# Patient Record
Sex: Female | Born: 1965
Health system: Southern US, Community
[De-identification: ages and names within clinical notes are randomized; demographics above are authoritative.]

## PROBLEM LIST (undated history)

## (undated) DIAGNOSIS — H269 Unspecified cataract: Secondary | ICD-10-CM

## (undated) DIAGNOSIS — L02416 Cutaneous abscess of left lower limb: Secondary | ICD-10-CM

## (undated) DIAGNOSIS — F329 Major depressive disorder, single episode, unspecified: Secondary | ICD-10-CM

## (undated) DIAGNOSIS — C539 Malignant neoplasm of cervix uteri, unspecified: Secondary | ICD-10-CM

## (undated) DIAGNOSIS — K219 Gastro-esophageal reflux disease without esophagitis: Secondary | ICD-10-CM

## (undated) DIAGNOSIS — J45909 Unspecified asthma, uncomplicated: Secondary | ICD-10-CM

## (undated) DIAGNOSIS — F32A Depression, unspecified: Secondary | ICD-10-CM

## (undated) DIAGNOSIS — I10 Essential (primary) hypertension: Secondary | ICD-10-CM

## (undated) DIAGNOSIS — I2510 Atherosclerotic heart disease of native coronary artery without angina pectoris: Secondary | ICD-10-CM

## (undated) DIAGNOSIS — E785 Hyperlipidemia, unspecified: Secondary | ICD-10-CM

## (undated) DIAGNOSIS — G500 Trigeminal neuralgia: Secondary | ICD-10-CM

## (undated) DIAGNOSIS — R109 Unspecified abdominal pain: Secondary | ICD-10-CM

## (undated) DIAGNOSIS — G8929 Other chronic pain: Secondary | ICD-10-CM

## (undated) DIAGNOSIS — R87619 Unspecified abnormal cytological findings in specimens from cervix uteri: Secondary | ICD-10-CM

## (undated) DIAGNOSIS — G629 Polyneuropathy, unspecified: Secondary | ICD-10-CM

## (undated) HISTORY — DX: Vomiting, unspecified: R10.9

## (undated) HISTORY — DX: Cutaneous abscess of left lower limb: L02.416

## (undated) HISTORY — DX: Unspecified cataract: H26.9

## (undated) HISTORY — DX: Unspecified asthma, uncomplicated: J45.909

## (undated) HISTORY — DX: Essential (primary) hypertension: I10

## (undated) HISTORY — PX: ABDOMINAL HYSTERECTOMY: SHX81

## (undated) HISTORY — DX: Hyperlipidemia, unspecified: E78.5

## (undated) HISTORY — DX: Unspecified abnormal cytological findings in specimens from cervix uteri: R87.619

## (undated) HISTORY — DX: Unspecified abdominal pain: R10.9

## (undated) HISTORY — PX: CHOLECYSTECTOMY: SHX55

---

## 1999-02-11 DIAGNOSIS — C539 Malignant neoplasm of cervix uteri, unspecified: Secondary | ICD-10-CM

## 1999-02-11 HISTORY — DX: Malignant neoplasm of cervix uteri, unspecified: C53.9

## 2009-07-17 ENCOUNTER — Ambulatory Visit: Payer: Self-pay | Admitting: Diagnostic Radiology

## 2009-07-17 ENCOUNTER — Encounter: Payer: Self-pay | Admitting: Cardiology

## 2009-07-17 ENCOUNTER — Encounter: Payer: Self-pay | Admitting: Emergency Medicine

## 2009-07-18 ENCOUNTER — Ambulatory Visit: Payer: Self-pay | Admitting: Internal Medicine

## 2009-07-18 ENCOUNTER — Inpatient Hospital Stay (HOSPITAL_COMMUNITY): Admission: EM | Admit: 2009-07-18 | Discharge: 2009-07-20 | Payer: Self-pay | Admitting: Internal Medicine

## 2009-07-19 ENCOUNTER — Encounter (INDEPENDENT_AMBULATORY_CARE_PROVIDER_SITE_OTHER): Payer: Self-pay | Admitting: *Deleted

## 2009-07-19 ENCOUNTER — Encounter: Payer: Self-pay | Admitting: Cardiology

## 2009-07-20 ENCOUNTER — Encounter (INDEPENDENT_AMBULATORY_CARE_PROVIDER_SITE_OTHER): Payer: Self-pay | Admitting: *Deleted

## 2009-07-24 ENCOUNTER — Encounter (INDEPENDENT_AMBULATORY_CARE_PROVIDER_SITE_OTHER): Payer: Self-pay | Admitting: Internal Medicine

## 2009-07-30 ENCOUNTER — Ambulatory Visit: Payer: Self-pay | Admitting: Internal Medicine

## 2009-07-30 ENCOUNTER — Encounter (INDEPENDENT_AMBULATORY_CARE_PROVIDER_SITE_OTHER): Payer: Self-pay | Admitting: Nurse Practitioner

## 2009-07-30 DIAGNOSIS — IMO0001 Reserved for inherently not codable concepts without codable children: Secondary | ICD-10-CM

## 2009-07-30 DIAGNOSIS — E785 Hyperlipidemia, unspecified: Secondary | ICD-10-CM | POA: Insufficient documentation

## 2009-07-30 DIAGNOSIS — E1165 Type 2 diabetes mellitus with hyperglycemia: Secondary | ICD-10-CM | POA: Insufficient documentation

## 2009-07-30 DIAGNOSIS — R0789 Other chest pain: Secondary | ICD-10-CM | POA: Insufficient documentation

## 2009-07-30 DIAGNOSIS — K3184 Gastroparesis: Secondary | ICD-10-CM

## 2009-07-30 DIAGNOSIS — R7611 Nonspecific reaction to tuberculin skin test without active tuberculosis: Secondary | ICD-10-CM | POA: Insufficient documentation

## 2009-07-30 DIAGNOSIS — E119 Type 2 diabetes mellitus without complications: Secondary | ICD-10-CM

## 2009-07-30 DIAGNOSIS — K219 Gastro-esophageal reflux disease without esophagitis: Secondary | ICD-10-CM | POA: Insufficient documentation

## 2009-07-30 DIAGNOSIS — Z794 Long term (current) use of insulin: Secondary | ICD-10-CM | POA: Insufficient documentation

## 2009-07-30 HISTORY — DX: Hyperlipidemia, unspecified: E78.5

## 2009-07-30 HISTORY — DX: Long term (current) use of insulin: Z79.4

## 2009-07-30 HISTORY — DX: Gastro-esophageal reflux disease without esophagitis: K21.9

## 2009-07-30 HISTORY — DX: Other chest pain: R07.89

## 2009-07-30 HISTORY — DX: Type 2 diabetes mellitus without complications: E11.9

## 2009-07-30 HISTORY — DX: Gastroparesis: K31.84

## 2009-07-30 HISTORY — DX: Reserved for inherently not codable concepts without codable children: IMO0001

## 2009-07-30 HISTORY — DX: Nonspecific reaction to tuberculin skin test without active tuberculosis: R76.11

## 2009-07-30 HISTORY — DX: Type 2 diabetes mellitus with hyperglycemia: E11.65

## 2009-08-01 ENCOUNTER — Telehealth (INDEPENDENT_AMBULATORY_CARE_PROVIDER_SITE_OTHER): Payer: Self-pay | Admitting: Internal Medicine

## 2009-08-14 ENCOUNTER — Telehealth (INDEPENDENT_AMBULATORY_CARE_PROVIDER_SITE_OTHER): Payer: Self-pay | Admitting: Internal Medicine

## 2009-08-30 ENCOUNTER — Telehealth (INDEPENDENT_AMBULATORY_CARE_PROVIDER_SITE_OTHER): Payer: Self-pay | Admitting: *Deleted

## 2009-08-30 DIAGNOSIS — I160 Hypertensive urgency: Secondary | ICD-10-CM

## 2009-08-30 DIAGNOSIS — I10 Essential (primary) hypertension: Secondary | ICD-10-CM

## 2009-08-30 HISTORY — DX: Hypertensive urgency: I16.0

## 2009-08-30 HISTORY — DX: Essential (primary) hypertension: I10

## 2009-08-31 ENCOUNTER — Encounter (INDEPENDENT_AMBULATORY_CARE_PROVIDER_SITE_OTHER): Payer: Self-pay | Admitting: Internal Medicine

## 2009-09-06 ENCOUNTER — Ambulatory Visit: Payer: Self-pay | Admitting: Internal Medicine

## 2009-09-06 DIAGNOSIS — I429 Cardiomyopathy, unspecified: Secondary | ICD-10-CM

## 2009-09-06 HISTORY — DX: Cardiomyopathy, unspecified: I42.9

## 2009-09-10 ENCOUNTER — Ambulatory Visit: Payer: Self-pay | Admitting: Diagnostic Radiology

## 2009-09-10 ENCOUNTER — Ambulatory Visit: Payer: Self-pay | Admitting: Cardiology

## 2009-09-10 ENCOUNTER — Ambulatory Visit: Payer: Self-pay | Admitting: Cardiovascular Disease

## 2009-09-10 ENCOUNTER — Ambulatory Visit: Payer: Self-pay

## 2009-09-10 ENCOUNTER — Inpatient Hospital Stay (HOSPITAL_COMMUNITY): Admission: EM | Admit: 2009-09-10 | Discharge: 2009-09-13 | Payer: Self-pay | Admitting: Cardiology

## 2009-09-10 ENCOUNTER — Encounter: Payer: Self-pay | Admitting: Internal Medicine

## 2009-09-10 ENCOUNTER — Encounter: Payer: Self-pay | Admitting: Emergency Medicine

## 2009-09-10 ENCOUNTER — Encounter (INDEPENDENT_AMBULATORY_CARE_PROVIDER_SITE_OTHER): Payer: Self-pay | Admitting: *Deleted

## 2009-09-11 ENCOUNTER — Encounter (INDEPENDENT_AMBULATORY_CARE_PROVIDER_SITE_OTHER): Payer: Self-pay | Admitting: *Deleted

## 2009-09-13 ENCOUNTER — Telehealth: Payer: Self-pay | Admitting: Gastroenterology

## 2009-09-14 ENCOUNTER — Inpatient Hospital Stay (HOSPITAL_COMMUNITY): Admission: AD | Admit: 2009-09-14 | Discharge: 2009-09-17 | Payer: Self-pay | Admitting: Internal Medicine

## 2009-09-14 ENCOUNTER — Ambulatory Visit: Payer: Self-pay | Admitting: Gastroenterology

## 2009-09-14 DIAGNOSIS — E1122 Type 2 diabetes mellitus with diabetic chronic kidney disease: Secondary | ICD-10-CM

## 2009-09-14 DIAGNOSIS — R1013 Epigastric pain: Secondary | ICD-10-CM

## 2009-09-14 DIAGNOSIS — R112 Nausea with vomiting, unspecified: Secondary | ICD-10-CM

## 2009-09-14 DIAGNOSIS — N183 Chronic kidney disease, stage 3 unspecified: Secondary | ICD-10-CM

## 2009-09-14 HISTORY — DX: Nausea with vomiting, unspecified: R11.2

## 2009-09-14 HISTORY — DX: Chronic kidney disease, stage 3 unspecified: N18.30

## 2009-09-14 HISTORY — DX: Chronic kidney disease, stage 3 unspecified: E11.22

## 2009-09-14 HISTORY — DX: Epigastric pain: R10.13

## 2009-09-15 ENCOUNTER — Ambulatory Visit: Payer: Self-pay | Admitting: Internal Medicine

## 2009-09-15 ENCOUNTER — Encounter: Payer: Self-pay | Admitting: Internal Medicine

## 2009-09-17 ENCOUNTER — Encounter (INDEPENDENT_AMBULATORY_CARE_PROVIDER_SITE_OTHER): Payer: Self-pay | Admitting: *Deleted

## 2009-09-20 ENCOUNTER — Encounter (INDEPENDENT_AMBULATORY_CARE_PROVIDER_SITE_OTHER): Payer: Self-pay | Admitting: Internal Medicine

## 2009-09-21 ENCOUNTER — Ambulatory Visit: Payer: Self-pay | Admitting: Internal Medicine

## 2009-09-21 DIAGNOSIS — N390 Urinary tract infection, site not specified: Secondary | ICD-10-CM | POA: Insufficient documentation

## 2009-09-21 DIAGNOSIS — IMO0002 Reserved for concepts with insufficient information to code with codable children: Secondary | ICD-10-CM

## 2009-09-21 DIAGNOSIS — R7401 Elevation of levels of liver transaminase levels: Secondary | ICD-10-CM | POA: Insufficient documentation

## 2009-09-21 DIAGNOSIS — R740 Nonspecific elevation of levels of transaminase and lactic acid dehydrogenase [LDH]: Secondary | ICD-10-CM

## 2009-09-21 DIAGNOSIS — R7402 Elevation of levels of lactic acid dehydrogenase (LDH): Secondary | ICD-10-CM

## 2009-09-21 DIAGNOSIS — E1149 Type 2 diabetes mellitus with other diabetic neurological complication: Secondary | ICD-10-CM

## 2009-09-21 DIAGNOSIS — G4700 Insomnia, unspecified: Secondary | ICD-10-CM

## 2009-09-21 DIAGNOSIS — F17200 Nicotine dependence, unspecified, uncomplicated: Secondary | ICD-10-CM

## 2009-09-21 HISTORY — DX: Urinary tract infection, site not specified: N39.0

## 2009-09-21 HISTORY — DX: Reserved for concepts with insufficient information to code with codable children: IMO0002

## 2009-09-21 HISTORY — DX: Elevation of levels of liver transaminase levels: R74.01

## 2009-09-21 HISTORY — DX: Elevation of levels of lactic acid dehydrogenase (LDH): R74.02

## 2009-09-21 HISTORY — DX: Insomnia, unspecified: G47.00

## 2009-09-21 HISTORY — DX: Nicotine dependence, unspecified, uncomplicated: F17.200

## 2009-09-21 HISTORY — DX: Type 2 diabetes mellitus with other diabetic neurological complication: E11.49

## 2009-09-21 LAB — CONVERTED CEMR LAB
Bilirubin Urine: NEGATIVE
Blood Glucose, Fingerstick: 148
Glucose, Urine, Semiquant: 100
Specific Gravity, Urine: 1.025
Urobilinogen, UA: 0.2

## 2009-09-25 ENCOUNTER — Telehealth (INDEPENDENT_AMBULATORY_CARE_PROVIDER_SITE_OTHER): Payer: Self-pay | Admitting: Internal Medicine

## 2009-09-25 ENCOUNTER — Encounter (INDEPENDENT_AMBULATORY_CARE_PROVIDER_SITE_OTHER): Payer: Self-pay | Admitting: Internal Medicine

## 2009-09-25 ENCOUNTER — Ambulatory Visit: Payer: Self-pay | Admitting: Family Medicine

## 2009-09-27 ENCOUNTER — Encounter (INDEPENDENT_AMBULATORY_CARE_PROVIDER_SITE_OTHER): Payer: Self-pay | Admitting: Internal Medicine

## 2009-09-28 ENCOUNTER — Ambulatory Visit: Payer: Self-pay | Admitting: Internal Medicine

## 2009-09-28 ENCOUNTER — Telehealth (INDEPENDENT_AMBULATORY_CARE_PROVIDER_SITE_OTHER): Payer: Self-pay | Admitting: Internal Medicine

## 2009-09-28 DIAGNOSIS — K259 Gastric ulcer, unspecified as acute or chronic, without hemorrhage or perforation: Secondary | ICD-10-CM

## 2009-09-28 DIAGNOSIS — K279 Peptic ulcer, site unspecified, unspecified as acute or chronic, without hemorrhage or perforation: Secondary | ICD-10-CM | POA: Insufficient documentation

## 2009-09-28 HISTORY — DX: Gastric ulcer, unspecified as acute or chronic, without hemorrhage or perforation: K25.9

## 2009-09-28 HISTORY — DX: Peptic ulcer, site unspecified, unspecified as acute or chronic, without hemorrhage or perforation: K27.9

## 2009-10-03 ENCOUNTER — Encounter (INDEPENDENT_AMBULATORY_CARE_PROVIDER_SITE_OTHER): Payer: Self-pay | Admitting: Internal Medicine

## 2009-10-03 ENCOUNTER — Telehealth (INDEPENDENT_AMBULATORY_CARE_PROVIDER_SITE_OTHER): Payer: Self-pay | Admitting: *Deleted

## 2009-10-08 ENCOUNTER — Telehealth (INDEPENDENT_AMBULATORY_CARE_PROVIDER_SITE_OTHER): Payer: Self-pay | Admitting: *Deleted

## 2009-10-16 ENCOUNTER — Encounter (INDEPENDENT_AMBULATORY_CARE_PROVIDER_SITE_OTHER): Payer: Self-pay | Admitting: Internal Medicine

## 2009-11-06 ENCOUNTER — Ambulatory Visit: Payer: Self-pay | Admitting: Cardiology

## 2009-11-06 ENCOUNTER — Encounter (INDEPENDENT_AMBULATORY_CARE_PROVIDER_SITE_OTHER): Payer: Self-pay | Admitting: Internal Medicine

## 2009-11-12 ENCOUNTER — Encounter
Admission: RE | Admit: 2009-11-12 | Discharge: 2009-11-12 | Payer: Self-pay | Source: Home / Self Care | Attending: Internal Medicine | Admitting: Internal Medicine

## 2009-12-04 ENCOUNTER — Encounter (INDEPENDENT_AMBULATORY_CARE_PROVIDER_SITE_OTHER): Payer: Self-pay | Admitting: Internal Medicine

## 2009-12-04 ENCOUNTER — Telehealth (INDEPENDENT_AMBULATORY_CARE_PROVIDER_SITE_OTHER): Payer: Self-pay | Admitting: Internal Medicine

## 2009-12-06 ENCOUNTER — Encounter (INDEPENDENT_AMBULATORY_CARE_PROVIDER_SITE_OTHER): Payer: Self-pay | Admitting: *Deleted

## 2009-12-12 ENCOUNTER — Telehealth (INDEPENDENT_AMBULATORY_CARE_PROVIDER_SITE_OTHER): Payer: Self-pay | Admitting: Internal Medicine

## 2009-12-13 ENCOUNTER — Telehealth (INDEPENDENT_AMBULATORY_CARE_PROVIDER_SITE_OTHER): Payer: Self-pay | Admitting: Internal Medicine

## 2009-12-17 ENCOUNTER — Encounter (INDEPENDENT_AMBULATORY_CARE_PROVIDER_SITE_OTHER): Payer: Self-pay | Admitting: Internal Medicine

## 2009-12-21 ENCOUNTER — Telehealth (INDEPENDENT_AMBULATORY_CARE_PROVIDER_SITE_OTHER): Payer: Self-pay | Admitting: Internal Medicine

## 2009-12-24 ENCOUNTER — Encounter
Admission: RE | Admit: 2009-12-24 | Discharge: 2010-01-29 | Payer: Self-pay | Source: Home / Self Care | Attending: Internal Medicine | Admitting: Internal Medicine

## 2009-12-25 ENCOUNTER — Ambulatory Visit: Payer: Self-pay | Admitting: Internal Medicine

## 2009-12-25 DIAGNOSIS — B3731 Acute candidiasis of vulva and vagina: Secondary | ICD-10-CM

## 2009-12-25 DIAGNOSIS — R300 Dysuria: Secondary | ICD-10-CM

## 2009-12-25 DIAGNOSIS — M25579 Pain in unspecified ankle and joints of unspecified foot: Secondary | ICD-10-CM

## 2009-12-25 DIAGNOSIS — B373 Candidiasis of vulva and vagina: Secondary | ICD-10-CM

## 2009-12-25 HISTORY — DX: Dysuria: R30.0

## 2009-12-25 HISTORY — DX: Pain in unspecified ankle and joints of unspecified foot: M25.579

## 2009-12-25 HISTORY — DX: Acute candidiasis of vulva and vagina: B37.31

## 2009-12-25 HISTORY — DX: Candidiasis of vulva and vagina: B37.3

## 2009-12-25 LAB — CONVERTED CEMR LAB
Blood Glucose, Fingerstick: 271
Hgb A1c MFr Bld: 8.5 %
Nitrite: NEGATIVE
Urobilinogen, UA: 0.2
pH: 7

## 2009-12-26 ENCOUNTER — Encounter (INDEPENDENT_AMBULATORY_CARE_PROVIDER_SITE_OTHER): Payer: Self-pay | Admitting: Internal Medicine

## 2009-12-27 ENCOUNTER — Encounter (INDEPENDENT_AMBULATORY_CARE_PROVIDER_SITE_OTHER): Payer: Self-pay | Admitting: Internal Medicine

## 2010-01-04 ENCOUNTER — Ambulatory Visit (HOSPITAL_COMMUNITY): Admission: RE | Admit: 2010-01-04 | Discharge: 2010-01-04 | Payer: Self-pay | Admitting: Internal Medicine

## 2010-01-08 ENCOUNTER — Ambulatory Visit: Payer: Self-pay | Admitting: Internal Medicine

## 2010-01-08 LAB — CONVERTED CEMR LAB
Blood in Urine, dipstick: NEGATIVE
Glucose, Urine, Semiquant: 100
Ketones, urine, test strip: NEGATIVE
Protein, U semiquant: NEGATIVE
WBC Urine, dipstick: NEGATIVE

## 2010-01-21 ENCOUNTER — Encounter (INDEPENDENT_AMBULATORY_CARE_PROVIDER_SITE_OTHER): Payer: Self-pay | Admitting: Internal Medicine

## 2010-01-30 ENCOUNTER — Encounter (INDEPENDENT_AMBULATORY_CARE_PROVIDER_SITE_OTHER): Payer: Self-pay | Admitting: Internal Medicine

## 2010-02-17 ENCOUNTER — Telehealth (INDEPENDENT_AMBULATORY_CARE_PROVIDER_SITE_OTHER): Payer: Self-pay | Admitting: Internal Medicine

## 2010-02-18 ENCOUNTER — Encounter (INDEPENDENT_AMBULATORY_CARE_PROVIDER_SITE_OTHER): Payer: Self-pay | Admitting: Internal Medicine

## 2010-03-12 NOTE — Progress Notes (Signed)
Summary: PT APPOINMENT  Phone Note Outgoing Call   Summary of Call: I CALL MS KIRLAND AND SHE WAS AWARE OF HER APPT YESTERDAY AND SHE HAVE ANOTHER ONE ON MONDAY 12-24-09 @ 2:15PM  AND THE PH# STILL THE SAME . Initial call taken by: Cheryll Dessert,  December 21, 2009 9:30 AM

## 2010-03-12 NOTE — Progress Notes (Signed)
  Phone Note Other Incoming   Request: Send information Summary of Call: Request for records received from [facility]. Request forwarded to [facility].     

## 2010-03-12 NOTE — Progress Notes (Signed)
Summary: FEELING DISCOMFORT IN CHEST  Phone Note Call from Patient Call back at [facility] Phone [phone]   Summary of Call: [name] PT. MS Fransisco Hertz SAYS THAT SHE IS FEELING SOME DISCOMFORT IN HER CHEST ON  THE LEFT SIDE AND HAVING TROUBLE SLEEPING ON THE LEFT SIDE, AND FEELING SOME PAIN. Initial call taken by: Leodis Rains,  September 25, 2009 8:52 AM  Follow-up for Phone Call        Taking medications but still experiencing some "discomfort" -- pain starts upper left chest, travels to lower lateral arm and around to the back.  Almost having difficulty getting to sleep because of discomfort, also waking up because of pain. Also having trouble lifting.  Rest and being still helps, unsure of what brings it on.  Will be doing normal activity and pain will come on.  Describes pain as shooting pain, comes and goes.   Denies any other symptoms except left forearm and index finger have "a little tingle".  Denies nausea, SOB. Follow-up by: Dutch Quint RN,  September 25, 2009 11:18 AM  Additional Follow-up for Phone Call Additional follow up Details #1::        Suspect this is her cervical radiculopathy--we just started her on the Neurontin (gabapentin)   and will have to wait and see how it works for her--will take some time. Also, will be sending to PT for this Please also, make copies of admitting labs--the ones with SGOT and SGPT in 350 range each as well as discharge summary attention to Estill Bamberg at [facility] Also let her know I have written the note she requested. Additional Follow-up by: Julieanne Manson MD,  September 26, 2009 9:29 PM    Additional Follow-up for Phone Call Additional follow up Details #2::    Records faxed to [phone]................ Vesta Mixer CMA  September 27, 2009 12:37 PM   Additional Follow-up for Phone Call Additional follow up Details #3:: Details for Additional Follow-up Action Taken: Pt also aware of note and will fax to [facility] at  [phone]. Additional Follow-up by: Vesta Mixer CMA,  September 28, 2009 12:43 PM

## 2010-03-12 NOTE — Letter (Signed)
Summary: PT  INFORMATION SHEET  PT  INFORMATION SHEET   Imported By: Arta Bruce 08/21/2009 14:47:21  _____________________________________________________________________  External Attachment:    Type:   Image     Comment:   External Document

## 2010-03-12 NOTE — Progress Notes (Signed)
Summary: medication issues  Phone Note Call from Patient   Summary of Call: The pt doesn't know which medication is causing her issues (making her swelling and gaining wt).  Pt prefer the provider call her back because she doesn't know which medication is causing her problems because she takes everything at the same time.  ([employer] [facility] [place])  Delrae Alfred Md Initial call taken by: Manon Hilding,  August 14, 2009 4:00 PM  Follow-up for Phone Call        Left message on answering machine for pt to return call   Pt still needs if someone can call her back because there is a medication that is causing her stomach pain.Manon Hilding  August 20, 2009 10:25 AM Follow-up by: Vesta Mixer CMA,  August 16, 2009 3:51 PM  Additional Follow-up for Phone Call Additional follow up Details #1::        Spoke with pt and she says she gets a sharp pain in her stomach about 15 minutes after taking her pills, she also gets nauseated, but this is better if she does not eat as much.  She has changed the time of day she takes them and still gets the same effect.  She has tried taking before and after eating, but still gets the same effect.  She is not sure which pill is causing the issue as she takes them all together. Additional Follow-up by: Vesta Mixer CMA,  August 29, 2009 10:38 AM    Additional Follow-up for Phone Call Additional follow up Details #2::    The Metoclopramide should always be taken before meals--30 minutes before and the last dose at bedtime Have her take the Pravastatin with her evening meal The Carvedilol should be taken with  or just after her morning and evening meal. The insulin should always be given 15 minutes before morning and evening meal See if taking that way helps. Call if no improvement. Follow-up by: Julieanne Manson MD,  August 31, 2009 8:17 AM  Additional Follow-up for Phone Call Additional follow up Details #3:: Details for Additional Follow-up Action Taken: Left  message on answer machine for pt. to return call. Gaylyn Cheers RN  September 04, 2009 11:24 AM  Unable to leave messages on phone -- "call back later."  Dutch Quint RN  September 10, 2009 12:11 PM  Have not been able to reach pt will mail letter to have her call us........  Elmarie Shiley McCoy CMA  September 17, 2009 3:20 PM

## 2010-03-12 NOTE — Letter (Signed)
Summary: Generic Letter  [facility]  [address] [place], [place] [address]   Phone: [phone]  Fax: [phone]    09/27/2009  Scheryl Darter Director of [facility]   Re:  [name]      [address]      [place], [place]  [address]  Dear [name]:  [name] has recently established care with me at our clinic.  The Job Description outline I was given does not really discuss signficant physical patient care.  Much of the description sounds like observation and paperwork.  I do not see any difficulties for [name] with the duties as listed.  She does currently have a left cervical radiculopathy and that may affect her ability to lift/help move a patient with that arm or hand.  If I am not recognizing what her duties are acurately, please let me know what specific physical functions she must be able to do.            Sincerely,   Julieanne Manson MD

## 2010-03-12 NOTE — Assessment & Plan Note (Signed)
Summary: ROV   Visit Type:  Follow-up Referring Provider:  Shawnie Pons, MD Primary Provider:  Julieanne Manson, MD  CC:  chest pain.  History of Present Illness: [name] is in for followup.  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  She continues to have some chest pain,  and it is mostly left parasternal, with some radiation to the left arm.  She gave up smoking, and is no longer doing this.  She and I reveiwed her coronary angios in the office today and I explained to her the results of Dr. Earl Gala findings.  Since I last saw her  (Dr. Johney Frame saw her initially, and she was seen back in followup by Wynell Balloon), she has continued to have some discomfort from time to time.  She does not break out in a sweat.  Gi studies were done, and these are as noted in the record.  In additon, her LFTs were abnormal ,and she had [facility], where LFTs were reevaluated.  SHe did have a CT scan earlier in the summer as well.    Current Medications (verified): 1)  Promethazine Hcl 12.5 Mg Tabs (Promethazine Hcl) .... 1/2 By Mouth Q 6 Hours 2)  Lisinopril 5 Mg Tabs (Lisinopril) .Marland Kitchen.. 1 By Mouth Once Daily 3)  Oxycodone-Acetaminophen 5-325 Mg Tabs (Oxycodone-Acetaminophen) .Marland Kitchen.. 1 By Mouth Q 6 Hours 4)  Carvedilol 3.125 Mg Tabs (Carvedilol) .Marland Kitchen.. 1 Tab By Mouth Two Times A Day 5)  Metoclopramide Hcl 10 Mg Tabs (Metoclopramide Hcl) .Marland Kitchen.. 1 Tab Before A Meal Three Times A Day and 1/2 Tab At Bedtime 6)  Aspirin 81 Mg Tbec (Aspirin) (On Hold) .Marland Kitchen.. 1 Tab By Mouth Daily 7)  Novolog Mix 70/30 Flexpen 70-30 % Susp (Insulin Aspart Prot & Aspart) .... Subcutaneously Inject 12 Units Two Times A Day 15 Minutes Before Meal. 8)  Novotwist Needles .... Two Times A Day Injections 9)  Omeprazole 40 Mg Cpdr (Omeprazole) .... Take 1 Tablet By Mouth Once A Day 10)  Pravastatin Sodium 40 Mg Tabs (Pravastatin Sodium) .Marland Kitchen.. 1 Tab By Mouth  Daily  Allergies: 1)  ! Morphine 2)  ! * Latex  Vital Signs:  Patient profile:   45 year old female Height:      71 inches Weight:      200 pounds BMI:     28.00 Pulse rate:   75 / minute Pulse rhythm:   regular BP sitting:   170 / 106  (left arm) Cuff size:   large  Vitals Entered By: Laurance Flatten CMA (November 06, 2009 12:23 PM)  Physical Exam  General:  Well developed, well nourished, in no acute distress. Head:  normocephalic and atraumatic Eyes:  PERRLA/EOM intact; conjunctiva and lids normal. Lungs:  Clear bilaterally to auscultation and percussion. Heart:  Normal S1 and S2.  No definite murmur.  Msk:  No definite tenderness in the left chest.   Extremities:  No clubbing or cyanosis. Neurologic:  Alert and oriented x 3.   EKG  Procedure date:  11/06/2009  Findings:      NSR.  WNL.    Cardiac Cath  Procedure date:  09/13/2009  Findings:      HEMODYNAMIC FINDINGS:  Central aortic pressure 112/72.  Left ventricular pressure 108/2.  Left ventricular end-diastolic pressure 3.   ANGIOGRAPHIC FINDINGS: 1. The left main coronary artery bifurcated into the circumflex, a     ramus intermediate branch  and the left anterior descending artery.     The left main artery had no disease. 2. The left anterior descending is a large vessel that courses to the     apex and wraps around the apex.  There appears to be mild 20%     plaque in the proximal and mid left anterior descending artery.     There are two small caliber diagonal branches that are free of any     significant disease. 3. The circumflex artery gives off a very small-caliber first obtuse     marginal branch and a moderate-sized second obtuse marginal branch.     The AV groove circumflex is moderate size.  There is no disease in     this system. 4. The ramus intermediate is a moderate-sized vessel that has a 30%     plaque in the midportion.5. The right coronary artery is a moderate-sized dominant vessel  with     mild 20% plaque in the midportion of the vessel. 6. Left ventricular angiogram was performed in the RAO projection,     which showed normal left ventricular systolic function with     ejection fraction of 65%.   IMPRESSION: 1. Mild nonobstructive coronary artery disease. 2. Normal left ventricular systolic function.   RECOMMENDATIONS:  I recommend continued medical management at this time with a statin, beta-blocker, and an aspirin.         Verne Carrow, MD      Echocardiogram  Procedure date:  09/10/2009  Findings:      Study Conclusions            - Left ventricle: The cavity size was normal. Wall thickness was       normal. Systolic function was normal. The estimated ejection       fraction was in the range of 50% to 55%. Wall motion was normal;       there were no regional wall motion abnormalities. Doppler       parameters are consistent with abnormal left ventricular       relaxation (grade 1 diastolic dysfunction).     - Mitral valve: Mild regurgitation.     Impressions:            - Possible density in IVC; suggest CT to further evaluate.  CT Scan  Procedure date:  09/11/2009  Findings:      Findings:  There is no evidence for hepatic vein or inferior vena   cava thrombosis/clot.  The portal veins are patent as well.  There   are no focal hepatic abnormalities.  The liver, spleen, pancreas,   kidneys, and adrenal glands have a normal appearance.  There is no   adenopathy.  The gallbladder has been previously removed.  The   abdominal aorta is normal in caliber.  The bowel, as visualized,   has a normal appearance.     Review of the MIP images confirms the above findings.    IMPRESSION:   No findings to suggest thrombus within the inferior vena cava or   hepatic venous thrombosis. Negative study.    Read By:  Stoney Bang,  M.D.  CT Scan  Procedure date:  07/19/2009  Findings:       CT CHEST WITH CONTRAST    Technique:   Multidetector CT imaging of the chest was performed   following the standard protocol during bolus administration of   intravenous contrast.    Contrast: 100  ml Omnipaque-300  Comparison: Thoracic spine MR of earlier today.  Plain film chest   of 07/17/2009.    Findings: Lung windows demonstrate minimal bibasilar atelectasis.   No nodules or airspace opacities.    Soft tissue windows demonstrate normal heart size.  Trace bilateral   pleural effusions.  No pericardial effusion.    Normal appearance of the aorta.  No evidence of dissection or   aortic aneurysm.    No mediastinal or hilar adenopathy. Minimal residual thymic tissue   in the anterior mediastinum.    Limited abdominal imaging demonstrates No acute osseous   abnormality.    IMPRESSION:    1.  No evidence of thoracic aortic aneurysm.   2.  Trace bilateral pleural effusions.    Read By:  Consuello Bossier,  M.D.  EGD  Procedure date:  09/15/2009  Findings:      Multiple ulcers were found in the cardia. several shallow circular     ulcers at the cardial consistent with vomiting, not bleeding, bile     reflux With standard forceps, a biopsy was obtained and sent to     pathology (see image002, image003, image006, image007, image008,     and image009).  Otherwise the examination was normal (see     image005, image004, and image001). bile reflux, no fluid     retention, gastric outlet is normal    Retroflexed views revealed     no abnormalities.    The scope was then withdrawn from the patient     and the procedure completed.           COMPLICATIONS:  None           ENDOSCOPIC IMPRESSION:     1) Ulcers, multiple in the cardia     2) Otherwise normal examination     s/p biopsies, suspect vomiting induced ulcerations     RECOMMENDATIONS:     control vomiting with Zofran     add Carafate     GEScan     bowl rest,     add psychotropic medications for anxiety           REPEAT EXAM:  In 0 year(s) for.            ______________________________     Hedwig Morton. Juanda Chance, MD  Impression & Recommendations:  Problem # 1:  CHEST PAIN, ATYPICAL (ICD-786.59) Continues to have some chest pain.  The etiology of this remains unclear.  She has had a pretty big workup, including a cardiac cath, upper endo, chest CT scan.  She also had an abdominal CT.  As long as she continues to smoke, she does remain at risk for ACS, and I tried to reinforce this to her.  She is awarre.  The endo findings were significant.  SHe has no had CT to exclude PE, but her Wells score is low, and d-dimer has been within the normal range on at least two occasions.  We will continue to monitor her, but lifestyle change is critical to acheiving a better outcome underscoring her risk factors.  I reviewed diabetic coronary outcomes from my recent review article as well given her diabetics.   Her updated medication list for this problem includes:    Lisinopril 5 Mg Tabs (Lisinopril) .Marland Kitchen... 1 by mouth once daily    Carvedilol 3.125 Mg Tabs (Carvedilol) .Marland Kitchen... 1 tab by mouth two times a day  Problem # 2:  ULCER-GASTRIC (ICD-531.9) Followed by Dr. Juanda Chance. The following medications were removed from  the medication list:    Carafate 1 Gm Tabs (Sucralfate) ..... Mix with 10cc water to make slurry and take by mouth before each meal and at bedtime for 2 weeks. Her updated medication list for this problem includes:    Omeprazole 40 Mg Cpdr (Omeprazole) .Marland Kitchen... Take 1 tablet by mouth once a day  Problem # 3:  DIABETES MELLITUS-TYPE II (ICD-250.00) Issues reviewed.  Her updated medication list for this problem includes:    Lisinopril 5 Mg Tabs (Lisinopril) .Marland Kitchen... 1 by mouth once daily    Novolog Mix 70/30 Flexpen 70-30 % Susp (Insulin aspart prot & aspart) ..... Subcutaneously inject 12 units two times a day 15 minutes before meal.  Other Orders: EKG w/ Interpretation (93000)  Patient Instructions: 1)  Your physician recommends that you continue on your  current medications as directed. Please refer to the Current Medication list given to you today. 2)  Your physician wants you to follow-up in: 6 MONTHS.  You will receive a reminder letter in the mail two months in advance. If you don't receive a letter, please call our office to schedule the follow-up appointment.  Appended Document: ROV Lauren  Can you ask this lady if she has had mammograms ever, and if not she probably should have to.    I spoke with the pt and she has not had any mammograms performed.  The pt is scheduled to see Dr Delrae Alfred in November and I instructed her to discuss this with Dr Delrae Alfred at upcoming appt.  Pt agreed with plan. The pt would also like a copy of OV note mailed to her home.

## 2010-03-12 NOTE — Assessment & Plan Note (Signed)
Summary: stomach and throat issues//gk   Vital Signs:  Patient profile:   45 year old female Weight:      190 pounds Temp:     97.6 degrees F Pulse rate:   82 / minute Pulse rhythm:   regular Resp:     18 per minute BP sitting:   142 / 90  (left arm) Cuff size:   regular  Vitals Entered By: Vesta Mixer CMA (September 21, 2009 11:59 AM) CC: recent hosp f/u Is Patient Diabetic? Yes CBG Result 148  Does patient need assistance? Ambulation Normal   Primary Care Provider:  Julieanne Manson, MD  CC:  recent hosp f/u.  History of Present Illness: 1.  IDDM:  Pt. hospitalized beginning of month.  Back in June, A1C was 9.6% and today at 7.5%.  Sugars in mornings running 140s to 160s.  Occasionally, checking sugars later in day.  Running in 160-70 range.   Never picked up paperwork for ICP fill of Novolog.  2.  Gastroparesis:  Pt. had confirming gastric emtying studies during last hospitalization.  Is taking 5 mg of Reglan 4 times daily.  Did also have multiple shallow ulcers with EGD--felt to be more related to recurrent vomiting --Dr. Juanda Chance.  Was started on Carafate, the other was Omeprazole, the latter which she was unable to afford.  Carafate not listed in her discharge papers and did not get as well.  H. pylori was negative.  Pt. states she had been taking the Reglan regularly before hospitalization.  We had a very difficult time reaching her since her visit in June  3.  Elevated Liver enzymes:  PHD in [place] was notified of discontinuing INH--was on 8th month and did fill since discharge--pt. states was not aware she needed to discontinue.  Had elevations of both transaminases into mid 300 range--ALT the higher number.  Pt. does admit to drinking alcohol occasionally, but not a lot.    4.  Tobacco Abuse:  stopped with recent hospitalization--discussed also how may relate to GI symptoms above--GERD, in particular and to continue to avoid MJ as well for both this and DM--as it leads  to the munchies.  5.  Chest pain:  Reason for admission:  underwent cardiac cath with findings of nonobstructive CAD.  Aspirin on hold for 2 weeks secondary to ulcer findings on EGD.  6.  Hypertension:  Taking all of meds--will run out of Lisinpril in a week.  7.  C spine stenosis:  pt. states also told she had some lumbasacral DDD as well with last hospitalization--though do not see currently looking at DC summary.  Has not had PT as was not confirmed for eligibility at last visit.  8.  Insomnia:  Having problem getting to sleep and maintaining sleep.    9.  Vision not good since last hospitalization      Allergies (verified): 1)  ! Morphine 2)  ! * Latex  Physical Exam  General:  NAD, happy Lungs:  Normal respiratory effort, chest expands symmetrically. Lungs are clear to auscultation, no crackles or wheezes. Heart:  Normal rate and regular rhythm. S1 and S2 normal without gallop, murmur, click, rub or other extra sounds.  Radial pulses normal and equal Abdomen:  Bowel sounds positive,abdomen soft and non-tender without masses, organomegaly or hernias noted.   Impression & Recommendations:  Problem # 1:  TRANSAMINASES, SERUM, ELEVATED (ICD-790.4) Also called into [place] PHD--left message that we are discontinuing INH until they evaluate.  Pt. on ?  month 7 or  8 of treatment. Received a call the following Monday, 09/24/09 from PHD, Estill Bamberg.  She asked that we fax records from hospital, including the liver enzymes.  Stated she had definitely completed 6 months of INH--not on Rifampin as in discharge summary--and may be that will have to accept that as adequate treatment.  Fax labs attention to her at [phone] Orders: T-Comprehensive Metabolic Panel [phone])  Problem # 2:  DIABETES MELLITUS-TYPE II (ICD-250.00) Much improved control With vision complaints--Retasure. Her updated medication list for this problem includes:    Lisinopril 5 Mg Tabs (Lisinopril) .Marland Kitchen...  1 by mouth once daily    Novolog Mix 70/30 Flexpen 70-30 % Susp (Insulin aspart prot & aspart) ..... Subcutaneously inject 12 units two times a day 15 minutes before meal.  Orders: Capillary Blood Glucose/CBG (82948) Hgb A1C (01749SW)  Problem # 3:  TOBACCO ABUSE (ICD-305.1) Hopefully, resolved long term.  Problem # 4:  INSOMNIA (ICD-780.52) Discussed use of Gabapentin at bedtime may help with this. Discussed good sleep hygiene  Problem # 5:  DIABETIC PERIPHERAL NEUROPATHY (ICD-250.60) Start Gabapentin and maintain better glucose control Her updated medication list for this problem includes:    Lisinopril 5 Mg Tabs (Lisinopril) .Marland Kitchen... 1 by mouth once daily    Novolog Mix 70/30 Flexpen 70-30 % Susp (Insulin aspart prot & aspart) ..... Subcutaneously inject 12 units two times a day 15 minutes before meal.  Problem # 6:  GASTROPARESIS (ICD-536.3) As a recent problem again--increase dose of Metoclopromide with meals only--maintain lower dose at bedtime  Problem # 7:  SPINAL STENOSIS, CERVICAL WITH LEFT RADICULOPATHY (ICD-723.0)  Orders: Physical Therapy Referral (PT) Physical Therapy Referral (PT)  Problem # 8:  CHEST PAIN, ATYPICAL (ICD-786.59) likely related to gastroparesis and vomiting. With ulcers, pt. to restart Carafate and take for 2 weeks. Also to start Omeprazole and take for at least 2 months  Complete Medication List: 1)  Promethazine Hcl 12.5 Mg Tabs (Promethazine hcl) .... 1/2 by mouth q 6 hours 2)  Vitamin B-6 25 Mg Tabs (Pyridoxine hcl) .Marland Kitchen.. 1 by mouth once daily 3)  Lisinopril 5 Mg Tabs (Lisinopril) .Marland Kitchen.. 1 by mouth once daily 4)  Isoniazid 300 Mg Tabs (Isoniazid) .Marland Kitchen.. 1 by mouth once daily 5)  Oxycodone-acetaminophen 5-325 Mg Tabs (Oxycodone-acetaminophen) .Marland Kitchen.. 1 by mouth q 6 hours 6)  Carvedilol 3.125 Mg Tabs (Carvedilol) .Marland Kitchen.. 1 tab by mouth two times a day 7)  Metoclopramide Hcl 10 Mg Tabs (Metoclopramide hcl) .Marland Kitchen.. 1 tab before a meal three times a day and 1/2  tab at bedtime 8)  Aspirin 81 Mg Tbec (aspirin) (on Hold)  .Marland Kitchen.. 1 tab by mouth daily 9)  Novolog Mix 70/30 Flexpen 70-30 % Susp (Insulin aspart prot & aspart) .... Subcutaneously inject 12 units two times a day 15 minutes before meal. 10)  Novotwist Needles  .... Two times a day injections 11)  Cipro 250 Mg Tabs (Ciprofloxacin hcl) .... Take 1 tablet by mouth two times a day 12)  Omeprazole 40 Mg Cpdr (Omeprazole) .... Take 1 tablet by mouth once a day 13)  Pravastatin Sodium 40 Mg Tabs (Pravastatin sodium) .Marland Kitchen.. 1 tab by mouth daily 14)  Carafate 1 Gm Tabs (Sucralfate) .... Mix with 10cc water to make slurry and take by mouth before each meal and at bedtime for 2 weeks. 15)  Gabapentin 300 Mg Caps (Gabapentin) .Marland Kitchen.. 1 cap by mouth at bedtime for 3 days, then increase to 2 caps at bedtime  Other Orders:  UA Dipstick w/o Micro (manual) ([id number])  Patient Instructions: 1)  Stop  Isoniazid until further notice--please call [place] PHD and let them know your liver enzymes were elevated 2)  Follow up with Dr. Delrae Alfred in 3 months --DM and gastroparesis, cervical radiculopathy 3)  Retasure--please schedule Prescriptions: GABAPENTIN 300 MG CAPS (GABAPENTIN) 1 cap by mouth at bedtime for 3 days, then increase to 2 caps at bedtime  #60 x 11   Entered and Authorized by:   Julieanne Manson MD   Signed by:   Julieanne Manson MD on 09/21/2009   Method used:   Faxed to ...       [facility] - Pharmac (retail)       [address] [place], [place]  [address]       Ph: [phone]       Fax: [phone]   RxID:   [id number] CARAFATE 1 GM TABS (SUCRALFATE) Mix with 10cc water to make slurry and take by mouth before each meal and at bedtime for 2 weeks.  #56 x 0   Entered and Authorized by:   Julieanne Manson MD   Signed by:   Julieanne Manson MD on 09/21/2009   Method used:   Faxed to ...       [facility] - Pharmac (retail)        [address] [place], [place]  [address]       Ph: [phone]       Fax: [phone]   RxID:   [id number] OMEPRAZOLE 40 MG CPDR (OMEPRAZOLE) Take 1 tablet by mouth once a day  #30 x 11   Entered and Authorized by:   Julieanne Manson MD   Signed by:   Julieanne Manson MD on 09/21/2009   Method used:   Faxed to ...       [facility] - Pharmac (retail)       [address] [place], [place]  [address]       Ph: [phone]       Fax: [phone]   RxID:   [id number] NOVOTWIST NEEDLES two times a day injections  #60 x 11   Entered and Authorized by:   Julieanne Manson MD   Signed by:   Julieanne Manson MD on 09/21/2009   Method used:   Faxed to ...       [facility] - Pharmac (retail)       [address] [place], [place]  [address]       Ph: [phone]       Fax: [phone]   RxID:   [id number] ASPIRIN 81 MG TBEC (ASPIRIN) (ON HOLD) 1 tab by mouth daily  #30 x 11   Entered and Authorized by:   Julieanne Manson MD   Signed by:   Julieanne Manson MD on 09/21/2009   Method used:   Faxed to ...       [facility] - Pharmac (retail)       [address] [place], [place]  [address]       Ph: [phone]       Fax: [phone]   RxID:   [id number] METOCLOPRAMIDE HCL 10 MG TABS (METOCLOPRAMIDE HCL) 1 tab before a meal three times a day and 1/2 tab at bedtime  #  120 x 11   Entered and Authorized by:   Julieanne Manson MD   Signed by:   Julieanne Manson MD on 09/21/2009   Method used:   Faxed to ...       [facility] - Pharmac (retail)       [address] [place], [place]  [address]       Ph: [phone]       Fax: [phone]   RxID:   [id number] PRAVASTATIN SODIUM 40 MG TABS (PRAVASTATIN SODIUM) 1 tab by mouth daily  #30 x 11   Entered and Authorized by:   Julieanne Manson  MD   Signed by:   Julieanne Manson MD on 09/21/2009   Method used:   Faxed to ...       [facility] - Pharmac (retail)       [address] [place], [place]  [address]       Ph: [phone]       Fax: [phone]   RxID:   [id number] CARVEDILOL 3.125 MG TABS (CARVEDILOL) 1 tab by mouth two times a day  #60 x 11   Entered and Authorized by:   Julieanne Manson MD   Signed by:   Julieanne Manson MD on 09/21/2009   Method used:   Faxed to ...       [facility] - Pharmac (retail)       [address] [place], [place]  [address]       Ph: [phone]       Fax: [phone]   RxID:   [id number] LISINOPRIL 5 MG TABS (LISINOPRIL) 1 by mouth once daily  #30 x 11   Entered and Authorized by:   Julieanne Manson MD   Signed by:   Julieanne Manson MD on 09/21/2009   Method used:   Faxed to ...       [facility] - Pharmac (retail)       [address] [place], [place]  [address]       Ph: [phone]       Fax: [phone]   RxID:   [id number] PROMETHAZINE HCL 12.5 MG TABS (PROMETHAZINE HCL) 1/2 by mouth q 6 hours  #30 x 0   Entered and Authorized by:   Julieanne Manson MD   Signed by:   Julieanne Manson MD on 09/21/2009   Method used:   Faxed to ...       [facility] - Pharmac (retail)       [address] [place], [place]  [address]       Ph: [phone]       Fax: [phone]   RxID:   [id number] NOVOLOG MIX 70/30 FLEXPEN 70-30 % SUSP (INSULIN ASPART PROT & ASPART) Subcutaneously inject 12 units two times a day 15 minutes before meal.  #1 month x 11   Entered and Authorized by:   Julieanne Manson MD   Signed by:   Julieanne Manson MD on 09/21/2009   Method used:   Faxed to ...       [facility] - Pharmac (retail)       [address] [place], [place]  [address]        Ph: [phone]  Fax: [phone]   RxID:   [id number]   Laboratory Results   Urine Tests  Date/Time Received: September 21, 2009 2:02 PM  Date/Time Reported: September 21, 2009 2:02 PM   Routine Urinalysis   Color: lt. yellow Glucose: 100   (Normal Range: Negative) Bilirubin: negative   (Normal Range: Negative) Ketone: negative   (Normal Range: Negative) Spec. Gravity: 1.025   (Normal Range: 1.003-1.035) Blood: negative   (Normal Range: Negative) pH: 6.0   (Normal Range: 5.0-8.0) Protein: negative   (Normal Range: Negative) Urobilinogen: 0.2   (Normal Range: 0-1) Nitrite: negative   (Normal Range: Negative) Leukocyte Esterace: negative   (Normal Range: Negative)     Blood Tests     HGBA1C: 7.5%   (Normal Range: Non-Diabetic - 3-6%   Control Diabetic - 6-8%) CBG Random:: 148mg /dL

## 2010-03-12 NOTE — Letter (Signed)
Summary: *HSN Results Follow up  [facility]  [address] [place], [place] [address]   Phone: [phone]  Fax: [phone]      12/06/2009   KIONDRA CAICEDO [facility] [address] [place], [place]  [address]   Dear  Ms. Laylamarie Seelye,                            ____S.Drinkard,FNP   ____D. Gore,FNP       ____B. McPherson,MD   ____V. Rankins,MD    ____E. Mulberry,MD    ____N. Daphine Deutscher, FNP  ____D. Reche Dixon, MD    ____K. Philipp Deputy, MD    ____Other     This letter is to inform you that your recent test(s):  _______Pap Smear    _______Lab Test     _______X-ray    _______ is within acceptable limits  _______ requires a medication change  _______ requires a follow-up lab visit  _______ requires a follow-up visit with your provider   Comments:  We have been trying to reach you.  Please give [place] a call at your earliest convenience.       _________________________________________________________ If you have any questions, please contact our office                     Sincerely,  [place] Shannon [facility]

## 2010-03-12 NOTE — Letter (Signed)
Summary: Generic Letter  [facility]  [address] [place], [place] [address]   Phone: [phone]  Fax: [phone]    10/16/2009  Scheryl Darter Director of [facility]  Re:  JONA ERKKILA      [address]      [place], [place]  [address]  Dear [name]:  Thank you for sending the more extensive list of Ms. Crutcher job responsibilities.  As per my previous letter, she may have some difficulties with her left arm/hand in that she does have a left c spine radiculopathy for which she should have started physical therapy.  As I have only seen her twice and her radiculopathy was one of many concerns, I am not certain as to how significantly this will affect her ability to do her job.  At this point, I believe she can perform most if not all of the listed duties and will need to adjust a bit for the difficulties with her left arm.  If Ms. Wallander or you find this is not the case, please let me know.       Sincerely,   Julieanne Manson MD

## 2010-03-12 NOTE — Assessment & Plan Note (Signed)
Summary: eph/chest pains   History of Present Illness: This is a [place] female patient who was admitted to the hospital with nausea and vomiting and atypical chest pain. She ruled out for an MI and underwent stress testing which showed low risk Myoview with no ischemia or infarct and apical hypokinesis ejection fraction 47%. Patient was to have a 2-D echo and followup with me. Unfortunately the 2-D echo is scheduled total next Monday.  The patient is doing well she denies chest pain palpitations dyspnea dyspnea on exertion dizziness or presyncope. She does have continuing left arm numbness and tingling but was found to have C-spine and thoracic spine issues on MRI. I asked her to followup with her primary care physician at health service for referral.  The patient continues to smoke cigarettes and I discussed the importance of cessation.  Current Medications (verified): 1)  Promethazine Hcl 12.5 Mg Tabs (Promethazine Hcl) .... 1/2 By Mouth Q 6 Hours 2)  Vitamin B-6 25 Mg Tabs (Pyridoxine Hcl) .Marland Kitchen.. 1 By Mouth Once Daily 3)  Lisinopril 5 Mg Tabs (Lisinopril) .Marland Kitchen.. 1 By Mouth Once Daily 4)  Isoniazid 300 Mg Tabs (Isoniazid) .Marland Kitchen.. 1 By Mouth Once Daily 5)  Oxycodone-Acetaminophen 5-325 Mg Tabs (Oxycodone-Acetaminophen) .Marland Kitchen.. 1 By Mouth Q 6 Hours 6)  Carvedilol 3.125 Mg Tabs (Carvedilol) .Marland Kitchen.. 1 Tab By Mouth Two Times A Day 7)  Pravastatin Sodium 40 Mg Tabs (Pravastatin Sodium) .Marland Kitchen.. 1 Tab By Mouth Daily With Meal 8)  Metoclopramide Hcl 10 Mg Tabs (Metoclopramide Hcl) .... 1/2 Tab By Mouth Before Every Meal and At Bedtime 9)  Aspirin 81 Mg Tbec (Aspirin) .Marland Kitchen.. 1 Tab By Mouth Daily 10)  Novolog Mix 70/30 Flexpen 70-30 % Susp (Insulin Aspart Prot & Aspart) .... Inject Dose Two Times A Day 15 Minutes Before Meals 11)  Novotwist Needles .... Two Times A Day Injections  Allergies (verified): 1)  ! Morphine 2)  ! * Latex  Past History:  Past Medical History: Last updated: 08/30/2009 1.  Hypertension.   2. Non-insulin dependent diabetes mellitus.   3. Positive PPD on isoniazid and rifampin per Health Department.      Social History: Reviewed history from 08/30/2009 and no changes required. The patient smokes one-pack per day times 2 years.  She   occasionally drinks.  She is married.  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      Review of Systems       see history of present illness  Vital Signs:  Patient profile:   45 year old female Height:      71 inches Weight:      197 pounds BMI:     27.58 Pulse rate:   68 / minute Resp:     18 per minute BP sitting:   142 / 90  (left arm)  Vitals Entered By: Marrion Coy, CNA (September 06, 2009 10:30 AM)  Physical Exam  General:   Well-nournished, in no acute distress. Neck: No JVD, HJR, Bruit, or thyroid enlargement Lungs: No tachypnea, clear without wheezing, rales, or rhonchi Cardiovascular: RRR, PMI not displaced, heart sounds normal, no murmurs, gallops, bruit, thrill, or heave. Abdomen: BS normal. Soft without organomegaly, masses, lesions or tenderness. Extremities: without cyanosis, clubbing or edema. Good distal pulses bilateral SKin: Warm, no lesions or rashes  Musculoskeletal: No deformities Neuro: no focal signs    Impression & Recommendations:  Problem # 1:  CARDIOMYOPATHY, SECONDARY (ICD-425.9) Patient had an ejection  fraction of 47% with apical hypokinesis on Myoview scan. She is scheduled for 2-D echo this Monday to get a better view of her LV function. She is asymptomatic. Her updated medication list for this problem includes:    Lisinopril 5 Mg Tabs (Lisinopril) .Marland Kitchen... 1 by mouth once daily    Carvedilol 3.125 Mg Tabs (Carvedilol) .Marland Kitchen... 1 tab by mouth two times a day    Aspirin 81 Mg Tbec (Aspirin) .Marland Kitchen... 1 tab by mouth daily  Problem # 2:  CHEST PAIN, ATYPICAL (ICD-786.59) Patient had atypical chest pain. Mild scan was negative for ischemia. Her updated  medication list for this problem includes:    Lisinopril 5 Mg Tabs (Lisinopril) .Marland Kitchen... 1 by mouth once daily    Carvedilol 3.125 Mg Tabs (Carvedilol) .Marland Kitchen... 1 tab by mouth two times a day    Aspirin 81 Mg Tbec (Aspirin) .Marland Kitchen... 1 tab by mouth daily  Problem # 3:  SPINAL STENOSIS, CERVICAL WITH LEFT RADICULOPATHY (ICD-723.0) Patient had MRI of her C-spine and thoracic spine and has degenerative disc disease causing multilevel stenosis. I asked that she should talk to her primary care physician for referral.  Patient Instructions: 1)  Your physician recommends that you schedule a follow-up appointment in: Dr. Riley Kill in 2 months 2)  Your physician recommends that you continue on your current medications as directed. Please refer to the Current Medication list given to you today. 3)  Your physician discussed the hazards of tobacco use.  Tobacco use cessation is recommended and techniques and options to help you quit were discussed. 4)  Your physician has requested that you have an echocardiogram.  Echocardiography is a painless test that uses sound waves to create images of your heart. It provides your doctor with information about the size and shape of your heart and how well your heart's chambers and valves are working.  This procedure takes approximately one hour. There are no restrictions for this procedure.  ALREADY SCHEDULED FOR September 10, 2009

## 2010-03-12 NOTE — Letter (Signed)
Summary: [facility]   Imported By: Arta Bruce 12/04/2009 12:19:39  _____________________________________________________________________  External Attachment:    Type:   Image     Comment:   External Document

## 2010-03-12 NOTE — Letter (Signed)
Summary: [facility]   Imported By: Arta Bruce 10/16/2009 11:50:45  _____________________________________________________________________  External Attachment:    Type:   Image     Comment:   External Document

## 2010-03-12 NOTE — Progress Notes (Signed)
  Request from DDS sent to [facility]  October 03, 2009 7:47 AM

## 2010-03-12 NOTE — Initial Assessments (Signed)
Summary: GERD & NON-CARDIAC CHEST PAIN    (NEW TO GI)   DEBORAH   History of Present Illness Visit Type: consult Primary GI MD: Melvia Heaps MD [facility] Primary Provider: Julieanne Manson, MD Requesting Provider: Shawnie Pons, MD Chief Complaint: chest pain non-cardiac, nausea/vomiting that is not helped by phenergan, abdominal pain History of Present Illness:   45 YO FEMALE NEW TO GI TODAY,REFERRED BY CARDIOLOGY. SHE HAS BEEN HOSPITALIZED TWICE THIS SUMMER C C/O CHEST PAIN. SHE HAS UNDERGONE EXTENSIVE WORKUP WHICH HAS BEEN NEGATIVE. THIS INCLUDES; CHEST CT,CT ANGIO OF ABDOMEN,ECHO WITH EF55%,STRESS MYOVIEW. SHE WAS READMITTED ON8/1/11 AND UNDERWENT CARDIAC CATH ON 8/2  WHICH SHOWED MILD NONOBSTRUCTIVE CORONARY DISEASE. SHE WAS D/C HOME YESTERDAY WITH APPT TO COME HERE TO CONSIDER EGD. PT COMES IN TODAY NOW C/O UPPER ABDOMINAL PAIN RADIATING IN TO HER BACK WHICH HAS GOTTEN PROGRESSIVELY WORSE OVER THE PAST 2 DAYS. SHE SAYS IT STARTED AFTER THE CATH. SHE IS HAVING NAUSEA,AND VOMITING,AND HAS NOT KEPT ANYTHING DOWN X 2 DAYS. NO FEVER. NO STOOLS IN 4-5 DAYS. SHE HAS BEN ON OMEPRAZOLE ONCE DAILY SINCE JUNE-NO CHANGE. SHE DOES HAVE HEARTBURN, NO DYSPHAGIA. SHE IS INSULIN DEPENDENT. HAS ALSO EMPIRICALLY BEEN ON REGLAN  4 X DAILY OVER THE PAST MONTH-NO CHANGE IN SXS. ONE BABY AS A DAILY-DENIES NSAIDS. SHE DENIES ANY DAILY ETOH USE.  SHE IS S/P GB2001, SAYS SHE HAD PANCREATITS IN 2005 ,UNCLEAR ETIOLOGY.   GI Review of Systems    Reports abdominal pain, acid reflux, bloating, chest pain, heartburn, loss of appetite, nausea, and  vomiting.     Location of  Abdominal pain: upper abdomen.    Denies belching, dysphagia with liquids, dysphagia with solids, vomiting blood, and  weight loss.      Reports change in bowel habits and  constipation.     Denies anal fissure, black tarry stools, diarrhea, diverticulosis, fecal incontinence, heme positive stool, hemorrhoids, irritable bowel syndrome, jaundice, light  color stool, liver problems, rectal bleeding, and  rectal pain. Preventive Screening-Counseling & Management  Alcohol-Tobacco     Smoking Status: quit      Drug Use:  no.      Current Medications (verified): 1)  Promethazine Hcl 12.5 Mg Tabs (Promethazine Hcl) .... 1/2 By Mouth Q 6 Hours 2)  Vitamin B-6 25 Mg Tabs (Pyridoxine Hcl) .Marland Kitchen.. 1 By Mouth Once Daily 3)  Lisinopril 5 Mg Tabs (Lisinopril) .Marland Kitchen.. 1 By Mouth Once Daily 4)  Isoniazid 300 Mg Tabs (Isoniazid) .Marland Kitchen.. 1 By Mouth Once Daily 5)  Oxycodone-Acetaminophen 5-325 Mg Tabs (Oxycodone-Acetaminophen) .Marland Kitchen.. 1 By Mouth Q 6 Hours 6)  Carvedilol 3.125 Mg Tabs (Carvedilol) .Marland Kitchen.. 1 Tab By Mouth Two Times A Day 7)  Crestor 40 Mg Tabs (Rosuvastatin Calcium) .... Take 1 Tablet By Mouth Once A Day 8)  Metoclopramide Hcl 5 Mg Tabs (Metoclopramide Hcl) .... Take 1 Tablet By Mouth Two Times A Day Before Meals 9)  Aspirin 81 Mg Tbec (Aspirin) (On Hold) .Marland Kitchen.. 1 Tab By Mouth Daily 10)  Novolog Mix 70/30 Flexpen 70-30 % Susp (Insulin Aspart Prot & Aspart) .... Inject Dose Two Times A Day 15 Minutes Before Meals 11)  Novotwist Needles .... Two Times A Day Injections 12)  Cipro 250 Mg Tabs (Ciprofloxacin Hcl) .... Take 1 Tablet By Mouth Two Times A Day 13)  Omeprazole 40 Mg Cpdr (Omeprazole) .... Take 1 Tablet By Mouth Once A Day  Allergies (verified): 1)  ! Morphine 2)  ! * Latex  Past History:  Past Medical  History: 1. Hypertension.  2.INSULIN-dependent diabetes mellitus.   3. Positive PPD on isoniazid and rifampin per Health Department.  Hyperlipidemia Cervical Cancer Pancreatitis 2005.  Past Surgical History: Cholecystectomy 2001 Hysterectomy CARDIAC CATH 8/11;NEGATIVE  Family History: Family History of Heart Disease: Father, MI age 64 deceased Family History of Diabetes: Mother Family History of Breast Cancer: PGGM  Social History: Single, 3 boys, 1 girl Part-Time-[employer]   Patient is a former smoker. Quit  09/10/2009 Alcohol Use - yes Daily Caffeine Use Sweet Tea Illicit Drug Use - no Smoking Status:  quit Drug Use:  no  Review of Systems       The patient complains of back pain, headaches-new, night sweats, sore throat, and voice change.  The patient denies allergy/sinus, anemia, anxiety-new, arthritis/joint pain, blood in urine, breast changes/lumps, confusion, cough, coughing up blood, depression-new, fainting, fatigue, fever, hearing problems, heart murmur, heart rhythm changes, itching, menstrual pain, muscle pains/cramps, nosebleeds, pregnancy symptoms, shortness of breath, skin rash, sleeping problems, swelling of feet/legs, swollen lymph glands, thirst - excessive, urination - excessive, urination changes/pain, urine leakage, and vision changes.         SEE HPI  Vital Signs:  Patient profile:   45 year old female Height:      71 inches Weight:      197 pounds BMI:     27.58 Pulse rate:   80 / minute Pulse rhythm:   regular BP supine:   140 / 86  (left arm) Cuff size:   regular  Vitals Entered By: Francee Piccolo CMA Duncan Dull) (September 14, 2009 2:43 PM)  Physical Exam  General:  Well developed, well nourished, no acute distress. Head:  Normocephalic and atraumatic. Eyes:  PERRLA, no icterus. Lungs:  Clear throughout to auscultation. Heart:  Regular rate and rhythm; no murmurs, rubs,  or bruits. Abdomen:  SOFT, TENDER ACROSS UPPER ABDOMEN,NO PALP MASS OR HSM,NO REBOUND,BS+ Rectal:  NOT DONE Neurologic:  Alert and  oriented x4;  grossly normal neurologically. Psych:  Alert and cooperative. Normal mood and affect.   Impression & Recommendations:  Problem # 1:  ABDOMINAL PAIN-EPIGASTRIC (ICD-789.06) Assessment New 45 YO FEMALE DIABETIC WITH NONCARDIAC CHEST PAIN WITH RECENT EXTENSIVE WORKUP UNREVEALING. ETIOLOGY UNCLEAR -R/O GERD,ESOPHAGEAL SPASM.  NOW WITH ACUTE EPIGASTRIC PAIN RADIATING TO HER BACK WITH INTRACTABLE NAUSEA AND VOMITING,INABILITY TO KEEP DOWN by mouth'S  X 2  DAYS. R/O PUD,R/O PANCREATITIS. R/O POST CATH  COMPLICATION THOUGH LESS LIKELY.  [facility] TO [facility]. WILL NEDD HYDRATION,LABS,PAIN CONTROL,ANTIEMETICS. PLAIN ABDOMINAL FILMS SHE HAD A CT ANGIO OF ABDOMEN ON 8/2 WHICH WAS NEGATIVE- IF HGB HAS DROPPED MAY NEED CT 9POST CARDIAC CATH) PROBABLE EGD . SEE ORDERS.  Problem # 2:  DIABETES MELLITUS-TYPE II (ICD-250.00) Assessment: Comment Only INSULIN DEPENDENT  Problem # 3:  HYPERTENSION (ICD-401.9) Assessment: Comment Only  Problem # 4:  POSITIVE PPD (ICD-795.5) Assessment: Comment Only ON TREATMENT X 7 MONTHS WITH ISONIAZID  Problem # 5:  SPINAL STENOSIS, CERVICAL WITH LEFT RADICULOPATHY (ICD-723.0) Assessment: Comment Only

## 2010-03-12 NOTE — Progress Notes (Signed)
Summary: Form to filled out  Phone Note Call from Patient Call back at [phone]   Summary of Call: Pt dropped off an Sport and exercise psychologist for the provider to filled out and pt would like to know if someone can call her back when is ready [name]  Initial call taken by: Manon Hilding,  August 30, 2009 8:22 AM  Follow-up for Phone Call        PUT IN YOUR REFILL SLOT Follow-up by: Arta Bruce,  August 30, 2009 9:16 AM  Additional Follow-up for Phone Call Additional follow up Details #1::        Form filled out--when pt. picks up--let her know that I have listed her to take 10 units two times a day of Novolog mix, but that I wrote to go up to max of 60 units daily total as suspect we will need to titrate.  Need to know if she is still using only 10 units twice daily or if she is even taking currently Also, need her to drop off sugar results weekly--last sugars from end of June. Additional Follow-up by: Julieanne Manson MD,  August 31, 2009 8:24 AM    Additional Follow-up for Phone Call Additional follow up Details #2::    CALLED PT TO PICK UP FORM Follow-up by: Arta Bruce,  August 31, 2009 2:14 PM   Appended Document: Form to filled out Please make sure she gets the above info when she picks up form  Appended Document: Form; med instructions Home phone [phone] not accepting messages; work # [phone] female stated we would need to continue calling her home # unable to leave message for her. Gaylyn Cheers RN  September 11, 2009 9:46 AM     Clinical Lists Changes

## 2010-03-12 NOTE — Letter (Signed)
Summary: Generic Letter  [facility]  [address] [place], [place] [address]   Phone: [phone]  Fax: [phone]    12/04/2009  Ms. Dawn Management consultant of [employer] Fax:  [phone]  Re:  KRITI KATAYAMA      [address]      [place], [place]  [address]  Dear [name]:  Sorry for the delay in returning you letter.  At this time, Ms. Blattner should be receiving PT for her left cervical radiculopathy.  I believe I have a follow up with her in November and we will see where she is at then.   Until that time, she may need some help with lifting, turning a patient and anything that requires her to lift with both hands.  Her physical therapist would be a better source for information as to how to accomodate for her left arm/hand.  I have not contacted Ms. Mettler to see how PT is progressing, but she should be going through PT at [facility] here in [place].  I believe the PT phone number is [phone]. Her other health issues when last seen were improved and stable.       Sincerely,   Julieanne Manson MD

## 2010-03-12 NOTE — Discharge Summary (Signed)
NAMENOVA, SCHMUHL            ACCOUNT NO.:  [id number]      MEDICAL RECORD NO.:  [id number]          PATIENT TYPE:  INP      LOCATION:  2020                         FACILITY:  [facility]      PHYSICIAN:  Erick Blinks, MD     DATE OF BIRTH:  11/23/1965      DATE OF ADMISSION:  07/18/2009   DATE OF DISCHARGE:  07/20/2009                                  DISCHARGE SUMMARY      PRIMARY CARE PHYSICIAN:  Unassigned.      DISCHARGE DIAGNOSES:   1. Atypical chest pain.   2. Hypertension.   3. Uncontrolled type 2 diabetes.   4. Tobacco dependence.   5. Possible cardiomyopathy with ejection fraction of 47% on stress       test.  This needs to be confirmed by 2-D echocardiogram.      DISCHARGE MEDICATIONS:   1. Enteric-coated aspirin 81 mg p.o. daily.   2. Coreg 3.125 mg p.o. b.i.d.   3. Crestor 40 mg p.o. daily.   4. Reglan 5 mg p.o. b.i.d.   5. Paragraph 6.25 mg p.o. q.6 h. P.r.n.   6. Lisinopril 5 mg p.o. daily.   7. NovoLog 70/30 ten units subcu b.i.d.   8. Oxycodone 5/325 mg 1 tablet p.o. q.6 h. p.r.n.   9. Fistula 1 tablet p.o. daily.   10.Pyridoxine 20 mg p.o. daily.   11.Isoniazid 300 mg 1 tablet p.o. daily.      BRIEF ADMISSION HISTORY:  This is a 45 year old female with hypertension   and diabetes who presents to the ER with complaints of nausea and   vomiting.  The patient started experiencing atypical chest pain starting   at 4 p.m.  The patient was admitted for further evaluation.      HOSPITAL COURSE:   1. Atypical chest pain.  The patient was seen in consultation by       [facility].  She ruled out for acute coronary syndrome with       negative cardiac enzymes and no significant EKG changes.  Due to       her significant risk factors and family history, she underwent       stress test which showed low-risk Myoview with no ischemia or       infarct and apical hypokinesis with EF of 47%.  It was recommended       the patient have a follow-up 2-D  echocardiogram as an outpatient to       confirm her 2-D echo and further follow up with Cardiology for       management as felt appropriate.  She has been started on Coreg,       aspirin Crestor as well as lisinopril.   2. Type 2 diabetes.  The patient was not taking any medication for       diabetes at home.  She was found to have an A1c of 9.6.  She has       been started on NovoLog 70/30 insulin.  She had extensive diabetes  education and has been taught how to administer insulin.  She will       have outpatient diabetes education as well and will follow up with       her primary care physician for further management.   3. Hypertension.  The patient has been started on lisinopril which       will likely be helpful for her blood pressure as well as her       diabetes.  She is also currently on Coreg.   4. Right arm tingling.  The patient had an MRI of the C-spine as well       as T-spine done to rule out any spinal processes that may be       causing a left arm and tingling.  MRI showed some spinal canal       stenosis.  This was discussed with the patient and was recommended       to continue with physical therapy as well as pain management.  If       her symptoms continue to worsen or are significantly impairing,       then neurosurgical evaluation can be conducted as an outpatient.   5. Question of thoracic aortic aneurysm.  The patient had MRI of the C       and T-spine done which showed questionable thoracic aortic       aneurysm.  Subsequent CT chest was done, which ruled out any       aneurysm.      CONSULTS OBTAINED IN THE HOSPITAL:  [facility].      PROCEDURES DONE IN THE HOSPITAL:  None.      DIAGNOSTIC IMAGING:   1. Chest x-ray from July 17, 2009, shows no active cardiopulmonary       process.   2. Stress test from July 19, 2009, shows low-risk Myoview with no       ischemia or infarct, suggests echo or MRI correlation for EF.   3. MRI of the C-spine shows  congenitally short pedicles, facet and       uncinate sparring, and degenerative disk disease causing multilevel       central and foraminal stenosis.   4. MRI of the T-spine shows spondylosis and degenerative disk disease       causing some mild right acentric central stenosis at T6-T7 and T7-       T8.  There is suspicion for ascending thoracic aortic aneurysm,       although ascending thoracic aorta is obscured, this could be       further investigated with chest CT if clinically warranted.   5. CT chest with contrast on July 19, 2009, shows no evidence of       thoracic aortic aneurysm, trace bilateral pleural effusions.      DISCHARGE INSTRUCTIONS:  The patient should continue on a heart-healthy,   low-calorie diet.  She has been made an appointment at [facility] to   follow up with primary care physician on August 03, 2009, at 11 a.m.  She   will follow with Dr. Delrae Alfred.  She will also need to follow up with   [facility] and will also need a followup 2-D echocardiogram as   an outpatient.  This will be set up by [facility].  She should   conduct her activity as tolerated.  Plan was discussed with the patient,   she is in agreement.  She will also likely need outpatient  physical   therapy for spinal stenosis.               Erick Blinks, MD            JM/MEDQ  D:  07/20/2009  T:  07/20/2009  Job:  [id number]      Electronically Signed by Durward Mallard MEMON  on 07/29/2009 12:33:49 AM

## 2010-03-12 NOTE — Progress Notes (Signed)
Summary: TRIAGE  Phone Note From Other Clinic Call back at [phone]  (pager)   Caller:  Lenard Galloway PA/Dr.Stuckey Call For: Dr. Arlyce Dice Reason for Call: Schedule Patient Appt Summary of Call: hx or GERD... non cardiac chest pain... would like pt worked in within next couple of weeks. Initial call taken by: Vallarie Mare,  September 13, 2009 10:26 AM  Follow-up for Phone Call        Pt. will see Mike Gip [facility] on 09-14-09 at 2:30pm. Susy Frizzle will advise pt. of appt/med.list/co-pay.  Follow-up by: Laureen Ochs LPN,  September 13, 2009 10:49 AM

## 2010-03-12 NOTE — Procedures (Signed)
Summary: Upper Endoscopy  Patient: [name] Note: All result statuses are Final unless otherwise noted.  Tests: (1) Upper Endoscopy (EGD)   EGD Upper Endoscopy       DONE     [facility]     [address]     [place], [place]  [address]           ENDOSCOPY PROCEDURE REPORT           PATIENT:  [name], [name]  MR#:  [id number]     BIRTHDATE:  1965-03-23, 43 yrs. old  GENDER:  female           ENDOSCOPIST:  Hedwig Morton. Juanda Chance, MD     Referred by:  Barbette Hair Arlyce Dice, M.D.           PROCEDURE DATE:  09/15/2009     PROCEDURE:  EGD with biopsy     ASA CLASS:  Class II     INDICATIONS:  vomiting persistent vomiting, negative cardiology     work up, diabetic           MEDICATIONS:   Versed 7 mg, Fentanyl 75 mcg     TOPICAL ANESTHETIC:  Cetacaine Spray           DESCRIPTION OF PROCEDURE:   After the risks benefits and     alternatives of the procedure were thoroughly explained, informed     consent was obtained.  The EG-[id number] ([id number]) endoscope was     introduced through the mouth and advanced to the second portion of     the duodenum, without limitations.  The instrument was slowly     withdrawn as the mucosa was fully examined.     <<PROCEDUREIMAGES>>           Multiple ulcers were found in the cardia. several shallow circular     ulcers at the cardial consistent with vomiting, not bleeding, bile     reflux With standard forceps, a biopsy was obtained and sent to     pathology (see image002, image003, image006, image007, image008,     and image009).  Otherwise the examination was normal (see     image005, image004, and image001). bile reflux, no fluid     retention, gastric outlet is normal    Retroflexed views revealed     no abnormalities.    The scope was then withdrawn from the patient     and the procedure completed.           COMPLICATIONS:  None           ENDOSCOPIC IMPRESSION:     1) Ulcers, multiple in the cardia     2) Otherwise normal  examination     s/p biopsies, suspect vomiting induced ulcerations     RECOMMENDATIONS:     control vomiting with Zofran     add Carafate     GEScan     bowl rest,     add psychotropic medications for anxiety           REPEAT EXAM:  In 0 year(s) for.           ______________________________     Hedwig Morton. Juanda Chance, MD           CC:           n.     eSIGNED:   Hedwig Morton. Brodie at 09/15/2009 10:31 AM           Carney Living,  [id number]  Note: An exclamation mark (!) indicates a result that was not dispersed into the flowsheet. Document Creation Date: 09/15/2009 10:33 AM _______________________________________________________________________  (1) Order result status: Final Collection or observation date-time: 09/15/2009 10:17 Requested date-time:  Receipt date-time:  Reported date-time:  Referring Physician:   Ordering Physician: Lina Sar [phone]) Specimen Source:  Source: Launa Grill Order Number: [id number] Lab site:

## 2010-03-12 NOTE — Consult Note (Signed)
[name], [name] NO.:  [id number]      MEDICAL RECORD NO.:  [id number]          PATIENT TYPE:  INP      LOCATION:  2020                         FACILITY:  [facility]      PHYSICIAN:  Hillis Range, MD       DATE OF BIRTH:  10-Apr-1965      DATE OF CONSULTATION:  07/18/2009   DATE OF DISCHARGE:                                    CONSULTATION      CARDIOLOGIST:  Arturo Morton. Riley Kill, MD, [facility]      REASON FOR CONSULT:  Chest pain.      HISTORY OF PRESENT ILLNESS:  This is a 45 year old African American   female with no known coronary artery disease who presents to the   emergency department, complaining of chest pain as well as tingling in   her left arm.  The chest pain is a stabbing sensation that lasts several   seconds and in the sternal area.  This is followed by a retrosternal   pressure that lasted 1-2 minutes.  She states that this has been   occurring for the past 2 days, but has increased in frequency.  She says   that sometimes the pain will increase in intensity with deep   inspiration.  The pain can sometimes be relieved with repositioning.   The tingling sensation has been going on for approximately 2 weeks and   is mid axillary on her left side that radiates down to her fingers on   her left side.  This pain also waxes and wanes.  She has experienced   some nausea and vomiting as well as hot flashes over the past several   days.  She has not been diaphoretic or increased shortness of breath.      In the emergency room, the patient was given nitro paste.  She has been   without chest pain since admission, but still has a tingling/numbness   feeling in her left arm.  Cardiology was asked to evaluate in light of   the patient's increased risk factors for coronary artery disease.      PAST MEDICAL HISTORY:   1. Hypertension.   2. Non-insulin dependent diabetes mellitus.   3. Positive PPD on isoniazid and rifampin per [facility] Department.      SOCIAL  HISTORY:  The patient lives in [place] with her boyfriend.   She is a home health aide.  She states that she does use cigarettes   occasionally as well as 1-2 glasses of wine occasionally.  She denies   any illicit drug use.  She says she does try an exercise and eat   healthy.      FAMILY HISTORY:  Her mother is still alive with history of strokes and   no coronary artery disease.  Her father passed away suddenly at age of   63 from an MI per the patient.      ALLERGIES:   1. MORPHINE.   2. LATEX.      MEDICATIONS:   1. Aspirin 325 mg  daily.   2. Coreg 3.125 mg twice a day.   3. Sliding scale insulin.   4. Isoniazid 300 mg daily.   5. Reglan 5 mg twice a day.   6. Lovaza 1 g daily.   7. Zofran 4 mg p.o. q.4 hours.   8. Protonix 40 mg twice a day.   9. Vitamin B6 25 mg daily.   10.Crestor 40 mg daily.      REVIEW OF SYSTEMS:  Pertinent positives as stated in HPI.  All other   systems are reviewed and are negative.      PHYSICAL EXAMINATION:  VITAL SIGNS:  Temperature 98.7, pulse 66,   respirations 14, blood pressure 101/64, O2 saturation 97% on room air.   GENERAL:  This is a well-developed [place] American female who appears   her stated age.  She is in no apparent distress with normal affect.   HEENT:  Normal.   NECK:  Neck supple without bruit or JVD.   CARDIOVASCULAR:  Regular rate and rhythm with S1-2.  No murmurs or rubs.   LUNGS:  Clear to auscultation without wheezes or rales.   SKIN:  No rash, lesions.  There was no shingles rash.   ABDOMEN:  Soft, nontender, positive bowel sounds x4.   EXTREMITIES:  No cyanosis, clubbing, edema.   MUSCULOSKELETAL:  No joint deformities.   NEURO:  Alert and oriented x3.      LABS:  WBC 8.5, hemoglobin 13.1, hematocrit 39.7.  Sodium 144, potassium   3.9, chloride 106, bicarb 27, BUN 11, creatinine 0.9.  D-dimer is 0.37.   Point of care markers are negative x2.  Cardiac enzymes are negative x2.   Total cholesterol 193,  triglycerides 61, HDL 40, LDL 141.      EKG showing sinus rhythm with a rate of 60 beats per minute.  Normal   axis and no ST-T wave changes.  No Q-waves noted.      ASSESSMENT AND PLAN:  This is a 45 year old female with history of   diabetes mellitus, hypertension, hyperlipidemia, and family history of   possible early coronary artery disease, who presents with chest pain.   The patient's chest pain is mostly atypical by history.  Her EKG is   without any acute ST-T wave changes and her cardiac enzymes remained   negative.  With the patient's multiple risk factors for coronary artery   disease, it would be advised to continue with an inpatient Lexiscan   Myoview for rule out of an ischemic process.  If the patient is at low   risk, then no further workup will be needed.  Tobacco cessation has been   discussed and advised for the patient.  We also considering adding an   ACE inhibitor for the patient's hypertension in light of her diabetes   mellitus.      Thank you for this consult.  We will continue to follow along with you.               Leonette Monarch, PA-C         ______________________________   Hillis Range, MD         NB/MEDQ  D:  07/18/2009  T:  07/19/2009  Job:  [id number]      Electronically Signed by Alen Blew P.A. on 07/31/2009 03:34:53 PM   Electronically Signed by Hillis Range MD on 08/06/2009 09:21:06 AM

## 2010-03-12 NOTE — Letter (Signed)
Summary: [facility]   Imported By: Arta Bruce 09/27/2009 10:01:46  _____________________________________________________________________  External Attachment:    Type:   Image     Comment:   External Document

## 2010-03-12 NOTE — Progress Notes (Signed)
  Phone Note From Other Clinic   Summary of Call: Darl Pikes from tb clinic in [facility] called and they do advise her to stop her tb med since her lft's have been elevated and she has had enough of the med to be treated and they will continue to check her lft's every two weeks until normal again and they will let us know those results also.  Pt is aware of this also. Initial call taken by: Vesta Mixer CMA,  September 28, 2009 4:53 PM

## 2010-03-12 NOTE — Letter (Signed)
Summary: *HSN Results Follow up  [facility]  [address] [place], [place] [address]   Phone: [phone]  Fax: [phone]      09/17/2009   MELVINIA ASHBY [address] [place], [place]  [address]   Dear  Ms. Damya Striplin,                               ____S. Weaver PA-C    __X__E. Mulberry,MD    ____N. Daphine Deutscher, FNP  ____Other     This letter is to inform you that your recent test(s):  _______Pap Smear    _______Lab Test     _______X-ray    _______ is within acceptable limits  _______ requires a medication change  _______ requires a follow-up lab visit  _______ requires a follow-up visit with your provider   Comments:  Please give our office a call when you receive this letter.  Thank you.       _________________________________________________________ If you have any questions, please contact our office                     Sincerely,  Vesta Mixer CMA [facility]

## 2010-03-12 NOTE — Letter (Signed)
Summary: LOG BOOK FOR BLOOD SUGAR  LOG BOOK FOR BLOOD SUGAR   Imported By: Arta Bruce 08/31/2009 12:57:21  _____________________________________________________________________  External Attachment:    Type:   Image     Comment:   External Document

## 2010-03-12 NOTE — Progress Notes (Signed)
Summary: Meds eligibility  Phone Note Other Incoming Call back at [phone]   Caller: Margy Clarks Rep Summary of Call: Called regarding getting Ms. [name] mix until can get eligibility.   Suggested their ICP program--will bring paperwork and drop off in next couple of days. May not even need to fill out the paperwork--so may just need the prescription--I will leave an Rx and can give to [name] when she comes if no paperwork needed.  Tiffany--I called [name] and left a message to call and speak with you so she knows what's going on.  After send in paperwork/Rx should take about 10 days.  Vernona Rieger thought another drug rep, Homero Fellers, might have some samples to cover her until can get the ICP in.  She told me both pens and needles would be provided. Also, need to know if [name] works--did not get that info at visit.  Initial call taken by: Julieanne Manson MD,  August 01, 2009 5:31 PM  Follow-up for Phone Call        Spoke with [name] and advised of provider's response-verbalized understanding.  She states that she works, but only 5.5 hours per week. Dutch Quint RN  August 03, 2009 2:09 PM   Additional Follow-up for Phone Call Additional follow up Details #1::        Did the paperwork get dropped off?   Did pt. pick up? Has pt. returned paperwork? Additional Follow-up by: Julieanne Manson MD,  August 09, 2009 5:29 PM    Additional Follow-up for Phone Call Additional follow up Details #2::    Left message on answering machine for pt to return call ........ Vesta Mixer CMA  August 16, 2009 3:50 PM   Additional Follow-up for Phone Call Additional follow up Details #3:: Details for Additional Follow-up Action Taken: Pt came in and picked up paperwork yesterday, will fill out and bring back to [place]. Additional Follow-up by: Vesta Mixer CMA,  August 21, 2009 9:06 AM  New/Updated Medications: NOVOLOG MIX 70/30 FLEXPEN 70-30 % SUSP (INSULIN ASPART PROT &  ASPART) inject dose two times a day 15 minutes before meals * NOVOTWIST NEEDLES two times a day injections Prescriptions: NOVOTWIST NEEDLES two times a day injections  #180 x 3   Entered and Authorized by:   Julieanne Manson MD   Signed by:   Julieanne Manson MD on 08/10/2009   Method used:   Print then Give to Patient   RxID:   [id number] NOVOLOG MIX 70/30 FLEXPEN 70-30 % SUSP (INSULIN ASPART PROT & ASPART) inject dose two times a day 15 minutes before meals  #3 mos/6 boxe x 3   Entered and Authorized by:   Julieanne Manson MD   Signed by:   Julieanne Manson MD on 08/01/2009   Method used:   Print then Give to Patient   RxID:   [id number]

## 2010-03-12 NOTE — Progress Notes (Signed)
Summary: PT REFERRAL   Phone Note Call from Patient   Summary of Call: pt has referral for PT in system and she has not heard from anyone yet Initial call taken by: Armenia Shannon,  December 13, 2009 8:45 AM  Follow-up for Phone Call        I'm sorry but I didn't have the Referral  I send the referral by fax today to the [facility] location they will contact he pt with an appt  Follow-up by: Cheryll Dessert,  December 13, 2009 11:10 AM

## 2010-03-12 NOTE — Medication Information (Signed)
Summary: [facility]IST[facility]/PT TO PICK UP  [facility]/PT TO PICK UP   Imported By: Arta Bruce 08/31/2009 12:49:09  _____________________________________________________________________  External Attachment:    Type:   Image     Comment:   External Document

## 2010-03-12 NOTE — Letter (Signed)
Summary: [facility]   [facility]   Imported By: Arta Bruce 01/10/2010 11:15:58  _____________________________________________________________________  External Attachment:    Type:   Image     Comment:   External Document

## 2010-03-12 NOTE — Assessment & Plan Note (Addendum)
Summary: POST HOSPITAL F/U         History of Present Illness Visit Type: Initial Visit Primary GI MD: Lina Sar MD Primary Provider: Julieanne Manson, MD Requesting Provider: Shawnie Pons, MD Chief Complaint: Post Hospitalization   c/o stomach pain/gastric ulcers History of Present Illness:   Patient hospitalized Aug. 5th through Aug. 8th for nausea, vomiting and abdominal pain. Gastric emptying study consistent with delayed gastric emptying. On EGD patient had multiple ulcers in the cardia.  Patient is on Reglan four times a day. Blood sugars still a little high. Just had insulin dose increased.  Muscle twitching in left chest.    GI Review of Systems      Denies abdominal pain, acid reflux, belching, bloating, chest pain, dysphagia with liquids, dysphagia with solids, heartburn, loss of appetite, nausea, vomiting, vomiting blood, weight loss, and  weight gain.        Denies anal fissure, black tarry stools, change in bowel habit, constipation, diarrhea, diverticulosis, fecal incontinence, heme positive stool, hemorrhoids, irritable bowel syndrome, jaundice, light color stool, liver problems, rectal bleeding, and  rectal pain.    Current Medications (verified): 1)  Promethazine Hcl 12.5 Mg Tabs (Promethazine Hcl) .... 1/2 By Mouth Q 6 Hours 2)  Vitamin B-6 25 Mg Tabs (Pyridoxine Hcl) .Marland Kitchen.. 1 By Mouth Once Daily 3)  Lisinopril 5 Mg Tabs (Lisinopril) .Marland Kitchen.. 1 By Mouth Once Daily 4)  Oxycodone-Acetaminophen 5-325 Mg Tabs (Oxycodone-Acetaminophen) .Marland Kitchen.. 1 By Mouth Q 6 Hours 5)  Carvedilol 3.125 Mg Tabs (Carvedilol) .Marland Kitchen.. 1 Tab By Mouth Two Times A Day 6)  Metoclopramide Hcl 10 Mg Tabs (Metoclopramide Hcl) .Marland Kitchen.. 1 Tab Before A Meal Three Times A Day and 1/2 Tab At Bedtime 7)  Aspirin 81 Mg Tbec (Aspirin) (On Hold) .Marland Kitchen.. 1 Tab By Mouth Daily (On Hold X 2 Weeks) 8)  Novolog Mix 70/30 Flexpen 70-30 % Susp (Insulin Aspart Prot & Aspart) .... Subcutaneously Inject 12 Units Two Times A Day 15  Minutes Before Meal. 9)  Novotwist Needles .... Two Times A Day Injections 10)  Omeprazole 40 Mg Cpdr (Omeprazole) .... Take 1 Tablet By Mouth Once A Day 11)  Pravastatin Sodium 40 Mg Tabs (Pravastatin Sodium) .Marland Kitchen.. 1 Tab By Mouth Daily 12)  Carafate 1 Gm Tabs (Sucralfate) .... Mix With 10cc Water To Make Slurry and Take By Mouth Before Each Meal and At Bedtime For 2 Weeks. 13)  Gabapentin 300 Mg Caps (Gabapentin) .Marland Kitchen.. 1 Cap By Mouth At Bedtime For 3 Days, Then Increase To 2 Caps At Bedtime  Allergies (verified): 1)  ! Morphine 2)  ! * Latex  Past History:  Past Medical History: 1. Hypertension.  2.INSULIN-dependent diabetes mellitus.   3. Positive PPD on isoniazid and rifampin per [facility].  Hyperlipidemia Cervical Cancer Pancreatitis 2005. Gastric Ulcers  Past Surgical History: Reviewed history from 09/14/2009 and no changes required. Cholecystectomy 2001 Hysterectomy CARDIAC CATH 8/11;NEGATIVE  Family History: Reviewed history from 09/14/2009 and no changes required. Family History of Heart Disease: Father, MI age 11 deceased Family History of Diabetes: Mother Family History of Breast Cancer: PGGM  Social History: Reviewed history from 09/14/2009 and no changes required. Single, 3 boys, 1 girl Part-Time-[employer]   Patient is a former smoker. Quit 09/10/2009 Alcohol Use - yes Daily Caffeine Use Sweet Tea Illicit Drug Use - no  Review of Systems       The patient complains of fatigue, skin rash, and sleeping problems.  The patient denies allergy/sinus, anemia, anxiety-new, arthritis/joint pain,  back pain, blood in urine, breast changes/lumps, change in vision, confusion, cough, coughing up blood, depression-new, fainting, fever, headaches-new, hearing problems, heart murmur, heart rhythm changes, itching, muscle pains/cramps, night sweats, nosebleeds, shortness of breath, sore throat, swelling of feet/legs, swollen lymph glands, thirst - excessive,  urination - excessive, urination changes/pain, urine leakage, vision changes, and voice change.    Vital Signs:  Patient profile:   45 year old female Height:      71 inches Weight:      192.50 pounds BMI:     26.95 Pulse rate:   80 / minute Pulse rhythm:   regular BP sitting:   178 / 96  (left arm)  Vitals Entered By: Milford Cage NCMA (September 28, 2009 1:46 PM)  Physical Exam  General:  Well developed, well nourished, no acute distress. Head:  Normocephalic and atraumatic. Eyes:  Conjunctiva pink, no icterus.  Lungs:  Clear throughout to auscultation. Heart:  RRR Abdomen:  Abdomen soft, nontender, nondistended. No obvious masses or hepatomegaly.Normal bowel sounds.  Msk:  Symmetrical with no gross deformities. Normal posture. Extremities:  No palmar erythema, no edema.  Neurologic:  Alert and  oriented x4;  grossly normal neurologically. Skin:  Intact without significant lesions or rashes. Cervical Nodes:  No significant cervical adenopathy. Psych:  Alert and cooperative. Normal mood and affect.   Impression & Recommendations:  Problem # 1:  NAUSEA AND VOMITING (ICD-787.01) Assessment Improved Documentated gastroparesis likely secondary to narcotics and has diabetes. Better on Reglan. Reviewed potential side effects of Reglan.  She does describe occasional muscle twitching left chest but cannot recall if present prior to starting Reglan. She will monitor closely, discontinue Reglan, and call us ASAP for any involuntary muscle movements, depression, or anxiety. Discussed need for good glycemic control. Follow up appt. in two months.  Problem # 2:  ULCER-GASTRIC (ICD-531.9) Assessment: New Multiple shallow ulcers in cardia. Her ASA is on hold for another two weeks. Doesn't take any other NSAIDS.  Problem # 3:  GERD (ICD-530.81) Assessment: Comment Only Continue daily PPI  30 minutes prior to breakfast.  Problem # 4:  POSITIVE PPD (ICD-795.5) Assessment: Improved Was  on preventative therapy with Isoniazid and Rifampin under direction of [place] [facility] [employer]. Transaminases have improved since stopping treating. AST down to 132 from 336, ALT down to 259 from 358. Viral hepatitis studies negative.  Problem # 5:  TRANSAMINASES, SERUM, ELEVATED (ICD-790.4) Abnormal LFTs on   Patient Instructions: 1)   Follow anti-reflux measures- we have given you a brochure. 2)  Finish Carafate.  3)  Call our office and report to [place] if you experience  muscle twitches on Reglan ( Metoclopramide) 4)  Follow up in 2 months with Dr. Juanda Chance. 5)  Continue the Omeprazole 1 tablet once daily. 6)  Try and control blood sugar levels. 7)  Copy sent to : Julieanne Manson, MD 8)                         Shawnie Pons, MD 9)  The medication list was reviewed and reconciled.  All changed / newly prescribed medications were explained.  A complete medication list was provided to the patient / caregiver.

## 2010-03-12 NOTE — Progress Notes (Signed)
  Phone Note Outgoing Call   Summary of Call: Please call patient and PT to see  if pt. is currently going through PT--I'm not sure I've seen any paperwork regarding this with her. Initial call taken by: Julieanne Manson MD,  December 04, 2009 9:02 AM  Follow-up for Phone Call        [facility]. I think pt did not want PT Michelle Nasuti  December 04, 2009 5:27 PM     Left message on answering machine for pt to call back....Marland KitchenMarland KitchenArmenia Shannon  December 05, 2009 12:52 PM   Additional Follow-up for Phone Call Additional follow up Details #1::        Left message on answering machine for pt to call back..will mail letter.Marland KitchenMarland KitchenArmenia Shannon  December 06, 2009 2:53 PM

## 2010-03-12 NOTE — Assessment & Plan Note (Signed)
Summary: XFU--CHEST PAIN//YC   Vital Signs:  Patient profile:   45 year old female Height:      71 inches Weight:      190 pounds BMI:     26.60 Temp:     97.9 degrees F Pulse rate:   79 / minute Pulse rhythm:   regular Resp:     18 per minute BP sitting:   130 / 89  (left arm) Cuff size:   large  Vitals Entered By: Vesta Mixer CMA (July 30, 2009 11:17 AM) CC: One time hospital f/u for chest pain June 7-10th Is Patient Diabetic? No Pain Assessment Patient in pain? yes     Location: left side  Does patient need assistance? Ambulation Normal   CC:  One time hospital f/u for chest pain June 7-10th.  History of Present Illness: 45 yo female who is here for hospital follow up.  She does not yet have an eligibility appt.  Pt. hospitalized 6/8 through 6/10 for atypical Chest pain.  1.  Chest pain :  Negative cardiac enzymes and EKG as well as Stress Cardiolite.  Did have borderline EF of 47%.  Pt. has not yet heard from Dr. Rosalyn Charters office regarding OP cardiology followup including echo.   Pt. generally feeling fine with energy.  Not on Carvedilol secondary to cost--but confirmed on $4 plan at [facility].  2.  DM:  Doing okay with insulin--started on Novolog 70/30.  Current dosing is 10 units two times a day --not taking at appropriate times--often not when planning to eat.  A1C during hospitalization was 9.9%.  Diagnosed with DM in 2002.  Pt. states at one time weighed about 300 lbs.    3.  Dyslipidmia:  LDL above 140, HDL 40.  Was given Crestor at discharge and cannot afford.  4.  Left cervical radiculopathy--cervical stenosis noted on MRI at time of discharge.  Has had symptoms for years.     5.  Positive PPD 5 months ago with clear CXR.  On about month 5 of treatment with Isoniazid and Pyridoxine.    6.  GERD/probable gastroparesis:  Not able to get Metoclopramide filled as too expensive.  10 mg tab, howver, is $4 at [facility]    Allergies (verified): 1)  ! Morphine 2)  !  * Latex  Physical Exam  General:  NAD Lungs:  Normal respiratory effort, chest expands symmetrically. Lungs are clear to auscultation, no crackles or wheezes. Heart:  Normal rate and regular rhythm. S1 and S2 normal without gallop, murmur, click, rub or other extra sounds.  Radial pulses normal and equal. Extremities:  No edema   Impression & Recommendations:  Problem # 1:  CHEST PAIN, ATYPICAL (ICD-786.59) With concern for cardiomyopathy--pt. to call Dr. Rosalyn Charters office and arrange follow up as planned--needs echo. Have [facility] Carvedilol at [facility]  Problem # 2:  DIABETES MELLITUS, TYPE II, UNCONTROLLED (ICD-250.02) Rx for insulin to [facility] pharmacy To take two times a day BEFORE meals and not skip meals Bring in sugars in 2 weeks. Spent 45 minutes face to face with pt. discussing diet, low blood sugar treatment and avoidance, how to give shots. Her updated medication list for this problem includes:    Lisinopril 5 Mg Tabs (Lisinopril) .Marland Kitchen... 1 by mouth once daily    Aspirin 81 Mg Tbec (Aspirin) .Marland Kitchen... 1 tab by mouth daily    Novolog Mix 70/30 70-30 % Susp (Insulin aspart prot & aspart) .Marland KitchenMarland KitchenMarland KitchenMarland Kitchen 10 units subcutaneously 15 minutes before a meal  two times a day  Problem # 3:  ? of GASTROPARESIS (ICD-536.3) Metoclopramide to [facility] pharmacy  Problem # 4:  DYSLIPIDEMIA (ICD-272.4) Will need follow uplabs once eligible. Her updated medication list for this problem includes:    Pravastatin Sodium 40 Mg Tabs (Pravastatin sodium) .Marland Kitchen... 1 tab by mouth daily with meal  Problem # 5:  SPINAL STENOSIS, CERVICAL WITH LEFT RADICULOPATHY (ICD-723.0) Arrange for PT once has eligibility  Complete Medication List: 1)  Promethazine Hcl 12.5 Mg Tabs (Promethazine hcl) .... 1/2 by mouth q 6 hours 2)  Vitamin B-6 25 Mg Tabs (Pyridoxine hcl) .Marland Kitchen.. 1 by mouth once daily 3)  Lisinopril 5 Mg Tabs (Lisinopril) .Marland Kitchen.. 1 by mouth once daily 4)  Isoniazid 300 Mg Tabs (Isoniazid) .Marland Kitchen.. 1 by mouth once  daily 5)  Oxycodone-acetaminophen 5-325 Mg Tabs (Oxycodone-acetaminophen) .Marland Kitchen.. 1 by mouth q 6 hours 6)  Carvedilol 3.125 Mg Tabs (Carvedilol) .Marland Kitchen.. 1 tab by mouth two times a day 7)  Pravastatin Sodium 40 Mg Tabs (Pravastatin sodium) .Marland Kitchen.. 1 tab by mouth daily with meal 8)  Metoclopramide Hcl 10 Mg Tabs (Metoclopramide hcl) .... 1/2 tab by mouth before every meal and at bedtime 9)  Aspirin 81 Mg Tbec (Aspirin) .Marland Kitchen.. 1 tab by mouth daily 10)  Novolog Mix 70/30 70-30 % Susp (Insulin aspart prot & aspart) .Marland Kitchen.. 10 units subcutaneously 15 minutes before a meal two times a day  Patient Instructions: 1)  Drop off sugars in 2 weeks. 2)  Call for appt. once you have eligibility 3)  Needs eligibility appt. Prescriptions: NOVOLOG MIX 70/30 70-30 % SUSP (INSULIN ASPART PROT & ASPART) 10 units subcutaneously 15 minutes before a meal two times a day  #1 month x 2   Entered and Authorized by:   Julieanne Manson MD   Signed by:   Julieanne Manson MD on 07/30/2009   Method used:   Faxed to ...       [facility] (retail)       [address] [place], [place]  [address]       Ph: [phone]       Fax: [phone]   RxID:   [id number] METOCLOPRAMIDE HCL 10 MG TABS (METOCLOPRAMIDE HCL) 1/2 tab by mouth before every meal and at bedtime  #60 x 2   Entered and Authorized by:   Julieanne Manson MD   Signed by:   Julieanne Manson MD on 07/30/2009   Method used:   Electronically to        [facility] [phone]* (retail)       [address]       [place], [place]  [address]       Ph: [phone]       Fax: [phone]   RxID:   [id number] PRAVASTATIN SODIUM 40 MG TABS (PRAVASTATIN SODIUM) 1 tab by mouth daily with meal  #30 x 2   Entered and Authorized by:   Julieanne Manson MD   Signed by:   Julieanne Manson MD on 07/30/2009   Method used:   Electronically to        [facility] [phone]* (retail)       [address]        [place], [place]  [address]       Ph: [phone]       Fax: [phone]   RxID:   [id number] CARVEDILOL 3.125 MG TABS (CARVEDILOL) 1 tab by mouth two  times a day  #60 x 4   Entered and Authorized by:   Julieanne Manson MD   Signed by:   Julieanne Manson MD on 07/30/2009   Method used:   Electronically to        [facility] [phone]* (retail)       [address]       [place], [place]  [address]       Ph: [phone]       Fax: [phone]   RxID:   [id number]

## 2010-03-12 NOTE — Progress Notes (Signed)
Summary: pt returning your call  Phone Note Call from Patient   Summary of Call: Patient is returning your call please call her at [phone] Initial call taken by: Domenic Polite,  December 12, 2009 9:11 AM  Follow-up for Phone Call        spoke with pt and she was returning my call... pt says she would like to go to PT... pt has not started yet... pt says that its time for a mammogram and she needs an appt Follow-up by: Arm[place] Shannon,  December 13, 2009 8:44 AM  Additional Follow-up for Phone Call Additional follow up Details #1::        Order written by Arna Medici for PT Order written for [facility] may get her set up. Please make a follow up appt. with me and pt. for left arm radiculopathy in 2 months if she does not already have a followup planned Additional Follow-up by: Julieanne Manson MD,  December 13, 2009 2:44 PM  New Problems: ROUTINE GYNECOLOGICAL EXAMINATION (ICD-V72.31)   Additional Follow-up for Phone Call Additional follow up Details #2::    appt with mulberry on Nov 15... mammo is scheduled... Armenia Shannon  December 14, 2009 12:51 PM   New Problems: ROUTINE GYNECOLOGICAL EXAMINATION (ICD-V72.31)

## 2010-03-12 NOTE — Miscellaneous (Signed)
Summary: Rehab Report/ INITIAL SUMMARY  Rehab Report/ INITIAL SUMMARY   Imported By: Arta Bruce 12/27/2009 12:07:40  _____________________________________________________________________  External Attachment:    Type:   Image     Comment:   External Document

## 2010-03-14 NOTE — Progress Notes (Signed)
  Phone Note Outgoing Call   Summary of Call: Please call pt. and let her know that because she has not continued with PT ( I received a report that she did not return after the 6th visit)  I would have to let her job know.  Need to know why she did not continue to show for PT.  Julieanne Manson MD  February 17, 2010 11:45 PM   Follow-up for Phone Call        pt is aware of above information  she state the reason she did not continue to go to PT is because she did not have transportation to get to the appts. [name]  February 18, 2010 3:47 PM

## 2010-03-14 NOTE — Miscellaneous (Signed)
Summary: Rehab //DISCHARGE SUMMARY//FAXED  Rehab //DISCHARGE SUMMARY//FAXED   Imported By: Arta Bruce 02/22/2010 11:30:34  _____________________________________________________________________  External Attachment:    Type:   Image     Comment:   External Document

## 2010-03-14 NOTE — Letter (Signed)
Summary: [facility]   Imported By: Arta Bruce 02/12/2010 16:03:12  _____________________________________________________________________  External Attachment:    Type:   Image     Comment:   External Document

## 2010-03-14 NOTE — Letter (Signed)
Summary: *HSN Results Follow up  [facility]  [address] [place], [place] [address]   Phone: [phone]  Fax: [phone]      01/21/2010   [name] [facility] [address] [place], [place]  [address]   Dear  Ms. [name],                            ____S.Drinkard,FNP   ____D. Gore,FNP       ____B. McPherson,MD   ____V. Rankins,MD    __X__[name],MD    ____N. Daphine Deutscher, FNP  ____D. Reche Dixon, MD    ____K. Philipp Deputy, MD    ____Other     This letter is to inform you that your recent test(s):  _______Pap Smear    ___X____Lab Test     _______X-ray    ____X___ is within acceptable limits  _______ requires a medication change  _______ requires a follow-up lab visit  _______ requires a follow-up visit with your provider   Comments:  Follow up evaluation of urine was negative for infection       _________________________________________________________ If you have any questions, please contact our office                     Sincerely,  Julieanne Manson MD [facility]

## 2010-03-14 NOTE — Letter (Signed)
Summary: Generic Letter  [facility]  [address] [place], [place] [address]   Phone: [phone]  Fax: [phone]    02/18/2010  Ms.  Dawn Management consultant of [facility]  Re:  [name]      [address]      [place], [place]  [address]  Dear [name]:  A note regarding follow up of [name].  She did not complete her physical therapy for her cervical radiculopathy.  I received a note from PT recently stating she was discharged from their care as she did not continue to show after 6 visits.  It is not clear how much progress was made.  I have not yet seen her in follow up since, but do not expect to see a significant improvement in her pain.  I have informed [name] that I would be notifying you of her current status.  She stated she did not continue with PT as she did not have transportation.          Sincerely,   Julieanne Manson MD

## 2010-03-14 NOTE — Assessment & Plan Note (Signed)
Summary: 3 month f/u dm/gastroparesis,cervial radiculapathy /tmm   Vital Signs:  Patient profile:   45 year old female Menstrual status:  postmenopausal Weight:      211.38 pounds Temp:     97.4 degrees F oral Pulse rate:   74 / minute Pulse rhythm:   regular Resp:     12 per minute BP sitting:   156 / 98  (left arm) Cuff size:   regular  Vitals Entered By: Hale Drone CMA (December 25, 2009 12:05 PM) CC: Pt. is here for a 3 month f/u on DM, gastroparesis and cervical radiculapathy. Pt. is complaining of left foot pain and on fingers. Did a visual foot exam, no problems identified Is Patient Diabetic? Yes Pain Assessment Patient in pain? yes     Location: left foot/finger joints Intensity: 7/6 Type: sharp Onset of pain  Constant CBG Result 271 CBG Device ID A     Menstrual Status postmenopausal   Primary Care Shirle Provencal:  Julieanne Manson, MD  CC:  Pt. is here for a 3 month f/u on DM, gastroparesis and cervical radiculapathy. Pt. is complaining of left foot pain and on fingers. Did a visual foot exam, and no problems identified.  History of Present Illness: 1.  Left cervical radiculopathy:  Just started with PT recently.  Has only had one session.  Has been told will be going for 6 weeks.  2.  Left ankle pain:  Pain posterior to lateral malleolus for past 4 weeks.  PT has stated they might  be able to help her with this.  Cannot remember any injury--just awakened with pain and gradually worsened.  Now pretty constant.  Does not recall overuse.  3.  Nonsobstructive CAD:  followed by Dr. Riley Kill  4.  Elevated Liver enzymes:  resolved with discontinuation of INH.  PHD followed up on this, felt she received adequate treatment for latent TB and liver enzymes returned to normal range.  5.  DM:  sugars ranging mainly in low 200s.  A1C up to 8.5%.  Using 12 units of Novolog mix two times a day.  Is not eating as she should.  Has gained 11 lbs since last here.  Feels she knows  what to do with meals.  Had flu vaccine, but not pneumovax that she can recall.  Pt. drinking a lot of juice.    6.  Urine odor, urine cloudy,  poor urine flow.  Slight dysuria.  Is having some itching. No definite vaginal discharge.    Current Medications (verified): 1)  Promethazine Hcl 12.5 Mg Tabs (Promethazine Hcl) .... 1/2 By Mouth Q 6 Hours 2)  Lisinopril 5 Mg Tabs (Lisinopril) .Marland Kitchen.. 1 By Mouth Once Daily 3)  Oxycodone-Acetaminophen 5-325 Mg Tabs (Oxycodone-Acetaminophen) .Marland Kitchen.. 1 By Mouth Q 6 Hours 4)  Carvedilol 3.125 Mg Tabs (Carvedilol) .Marland Kitchen.. 1 Tab By Mouth Two Times A Day 5)  Metoclopramide Hcl 10 Mg Tabs (Metoclopramide Hcl) .Marland Kitchen.. 1 Tab Before A Meal Three Times A Day and 1/2 Tab At Bedtime 6)  Aspirin 81 Mg Tbec (Aspirin) (On Hold) .Marland Kitchen.. 1 Tab By Mouth Daily 7)  Novolog Mix 70/30 Flexpen 70-30 % Susp (Insulin Aspart Prot & Aspart) .... Subcutaneously Inject 12 Units Two Times A Day 15 Minutes Before Meal. 8)  Novotwist Needles .... Two Times A Day Injections 9)  Omeprazole 40 Mg Cpdr (Omeprazole) .... Take 1 Tablet By Mouth Once A Day 10)  Pravastatin Sodium 40 Mg Tabs (Pravastatin Sodium) .Marland Kitchen.. 1 Tab By Mouth Daily  Allergies (verified): 1)  ! Morphine 2)  ! * Latex  Physical Exam  Lungs:  Normal respiratory effort, chest expands symmetrically. Lungs are clear to auscultation, no crackles or wheezes. Heart:  normal rate and regular rhythm.   Abdomen:  Mild suprapubic tenderness.  No CVA tendernesssoft, normal bowel sounds, no masses, no guarding, no rebound tenderness, no hepatomegaly, and no splenomegaly.   Extremities:  Tender behind lateral malleolus to lateral arch.  No erythema or swelling.  Full ROm of ankle  Diabetes Management Exam:    Foot Exam (with socks and/or shoes not present):       Sensory-Monofilament:          Left foot: normal          Right foot: normal   Impression & Recommendations:  Problem # 1:  ANKLE PAIN, LEFT (ICD-719.47) Add to PT  orders. Orders: Physical Therapy Referral (PT)  Problem # 2:  DYSURIA (ICD-788.1)  Orders: UA Dipstick w/o Micro (manual) (04540) KOH/ WET Mount [phone]) T-Culture, Urine ([id number])  Problem # 3:  VAGINITIS, CANDIDAL (ICD-112.1)  Her updated medication list for this problem includes:    Fluconazole 150 Mg Tabs (Fluconazole) .Marland Kitchen... 1 tab by mouth for one dose  Problem # 4:  TRANSAMINASES, SERUM, ELEVATED (ICD-790.4) Resolved after dc of INH  Problem # 5:  SPINAL STENOSIS, CERVICAL WITH LEFT RADICULOPATHY (ICD-723.0) Continues with PT Orders: Physical Therapy Referral (PT)  Problem # 6:  DIABETES MELLITUS, TYPE II, UNCONTROLLED (ICD-250.02) Pt. to get back on better lifestyle changes with diet and physical activity Increase Novolog to 14 untis two times a day  Her updated medication list for this problem includes:    Lisinopril 5 Mg Tabs (Lisinopril) .Marland Kitchen... 1 by mouth once daily in evening    Novolog Mix 70/30 Flexpen 70-30 % Susp (Insulin aspart prot & aspart) ..... Subcutaneously inject 14 units two times a day 15 minutes before meal.  Problem # 7:  HYPERTENSION (ICD-401.9) BP up a bit today.   No change in meds for now--in pain--follow closesly Her updated medication list for this problem includes:    Lisinopril 5 Mg Tabs (Lisinopril) .Marland Kitchen... 1 by mouth once daily in evening    Carvedilol 3.125 Mg Tabs (Carvedilol) .Marland Kitchen... 1 tab by mouth two times a day  Complete Medication List: 1)  Promethazine Hcl 12.5 Mg Tabs (Promethazine hcl) .... 1/2 by mouth q 6 hours 2)  Lisinopril 5 Mg Tabs (Lisinopril) .Marland Kitchen.. 1 by mouth once daily in evening 3)  Oxycodone-acetaminophen 5-325 Mg Tabs (Oxycodone-acetaminophen) .Marland Kitchen.. 1 by mouth q 6 hours 4)  Carvedilol 3.125 Mg Tabs (Carvedilol) .Marland Kitchen.. 1 tab by mouth two times a day 5)  Metoclopramide Hcl 10 Mg Tabs (Metoclopramide hcl) .Marland Kitchen.. 1 tab before a meal three times a day and 1/2 tab at bedtime 6)  Aspirin 81 Mg Tbec (aspirin) (on Hold)  .Marland Kitchen.. 1 tab  by mouth daily with evening meal 7)  Novolog Mix 70/30 Flexpen 70-30 % Susp (Insulin aspart prot & aspart) .... Subcutaneously inject 14 units two times a day 15 minutes before meal. 8)  Novotwist Needles  .... Two times a day injections 9)  Pravastatin Sodium 40 Mg Tabs (Pravastatin sodium) .Marland Kitchen.. 1 tab by mouth daily with evening meal 10)  Fluconazole 150 Mg Tabs (Fluconazole) .Marland Kitchen.. 1 tab by mouth for one dose 11)  Ciprofloxacin Hcl 500 Mg Tabs (Ciprofloxacin hcl) .Marland Kitchen.. 1 tab by mouth two times a day for 7 days  Other Orders: Capillary Blood  Glucose/CBG [id number]) Hemoglobin A1C (83036) Flu Vaccine 58yrs + (60454) Admin 1st Vaccine (09811) Pneumococcal Vaccine (91478) Admin of Any Addtl Vaccine (29562)  Patient Instructions: 1)  Referral to Susie Piper--needs help with eating habits. 2)  Follow up with Dr. Delrae Alfred in 2 months--left radiculopathy Prescriptions: FLUCONAZOLE 150 MG TABS (FLUCONAZOLE) 1 tab by mouth for one dose  #1 x 0   Entered and Authorized by:   Julieanne Manson MD   Signed by:   Julieanne Manson MD on 12/25/2009   Method used:   Faxed to ...       [facility] (retail)       [address] [place], [place]  [address]       Ph: [phone]       Fax: [phone]   RxID:   [id number] NOVOLOG MIX 70/30 FLEXPEN 70-30 % SUSP (INSULIN ASPART PROT & ASPART) Subcutaneously inject 14 units two times a day 15 minutes before meal.  #1 month x 11   Entered and Authorized by:   Julieanne Manson MD   Signed by:   Julieanne Manson MD on 12/25/2009   Method used:   Faxed to ...       [facility] (retail)       [address] [place], [place]  [address]       Ph: [phone]       Fax: [phone]   RxID:   [id number]    Orders Added: 1)  Capillary Blood Glucose/CBG [82948] 2)  Hemoglobin A1C [83036] 3)  Flu Vaccine 40yrs + [90658] 4)  Admin 1st Vaccine [90471] 5)   Est. Patient Level IV [27062] 6)  UA Dipstick w/o Micro (manual) [81002] 7)  KOH/ WET Mount [87210] 8)  Physical Therapy Referral [PT] 9)  Pneumococcal Vaccine [90732] 10)  Admin of Any Addtl Vaccine [90472] 11)  T-Culture, Urine [[id number]]   Immunizations Administered:  Influenza Vaccine # 1:    Vaccine Type: Fluvax 3+    Site: left deltoid    Mfr: GlaxoSmithKline    Dose: 0.5 ml    Route: IM    Given by: Hale Drone CMA    Exp. Date: 08/10/2010    Lot #: [id number]    VIS given: 09/04/09 version given December 25, 2009.  Pneumonia Vaccine:    Vaccine Type: Pneumovax    Site: right deltoid    Mfr: Merck    Dose: 0.5 ml    Route: IM    Given by: Hale Drone CMA    Exp. Date: 04/30/2011    Lot #: [id number]    VIS given: 01/15/09 version given December 25, 2009.  Flu Vaccine Consent Questions:    Do you have a history of severe allergic reactions to this vaccine? no    Any prior history of allergic reactions to egg and/or gelatin? no    Do you have a sensitivity to the preservative Thimersol? no    Do you have a past history of Guillan-Barre Syndrome? no    Do you currently have an acute febrile illness? no    Have you ever had a severe reaction to latex? yes    Vaccine information given and explained to patient? yes    Are you currently pregnant? no   Immunizations Administered:  Influenza Vaccine # 1:    Vaccine Type: Fluvax 3+    Site: left deltoid  Mfr: GlaxoSmithKline    Dose: 0.5 ml    Route: IM    Given by: Hale Drone CMA    Exp. Date: 08/10/2010    Lot #: [id number]    VIS given: 09/04/09 version given December 25, 2009.  Pneumonia Vaccine:    Vaccine Type: Pneumovax    Site: right deltoid    Mfr: Merck    Dose: 0.5 ml    Route: IM    Given by: Hale Drone CMA    Exp. Date: 04/30/2011    Lot #: [id number]    VIS given: 01/15/09 version given December 25, 2009.               Diabetic Foot Exam Foot Inspection Is there a history of a foot ulcer?               No Is there a foot ulcer now?              No Can the patient see the bottom of their feet?          Yes Are the shoes appropriate in style and fit?          Yes Is there swelling or an abnormal foot shape?          Yes Are the toenails long?                No Are the toenails thick?                No Are the toenails ingrown?              No Is there heavy callous build-up?              Yes Is there a claw toe deformity?                          Yes Is there elevated skin temperature?            No Is there limited ankle dorsiflexion?            No Is there foot or ankle muscle weakness?            No Do you have pain in calf while walking?           No      Comments: Callus areas on bottom of lt and rt foot beneath 4th toes, also on outer aspect of rt foot. bunions present on both feet.     10-g (5.07) Semmes-Weinstein Monofilament Test Performed by: Gaylyn Cheers RN          Right Foot          Left Foot Visual Inspection               Test Control      normal         normal Site 1         normal         normal Site 2         normal         normal Site 3         normal         normal Site 4         normal         normal Site 5         normal  normal Site 6         normal         normal Site 7         normal         normal Site 8         normal         normal Site 9         normal         normal Site 10         normal         normal  Impression      normal         normal     Laboratory Results   Urine Tests  Date/Time Received: December 25, 2009 1:47 PM   Routine Urinalysis   Color: lt. yellow Appearance: Hazy Glucose: 250   (Normal Range: Negative) Bilirubin: negative   (Normal Range: Negative) Ketone: negative   (Normal Range: Negative) Spec. Gravity: 1.020   (Normal Range: 1.003-1.035) Blood: trace-lysed   (Normal Range: Negative) pH: 7.0   (Normal Range: 5.0-8.0) Protein: negative   (Normal Range: Negative) Urobilinogen: 0.2    (Normal Range: 0-1) Nitrite: negative   (Normal Range: Negative) Leukocyte Esterace: trace   (Normal Range: Negative)     Blood Tests   Date/Time Received: December 25, 2009 1:43 PM   HGBA1C: 8.5%   (Normal Range: Non-Diabetic - 3-6%   Control Diabetic - 6-8%) CBG Random:: 271mg /dL    Wet Mount Source: self swab WBC/hpf: 1-5 Bacteria/hpf: 1+ Clue cells/hpf: none  Negative whiff Yeast/hpf: moderate Wet Mount KOH: 1+ Trichomonas/hpf: none    Laboratory Results   Urine Tests    Routine Urinalysis   Color: lt. yellow Appearance: Hazy Glucose: 250   (Normal Range: Negative) Bilirubin: negative   (Normal Range: Negative) Ketone: negative   (Normal Range: Negative) Spec. Gravity: 1.020   (Normal Range: 1.003-1.035) Blood: trace-lysed   (Normal Range: Negative) pH: 7.0   (Normal Range: 5.0-8.0) Protein: negative   (Normal Range: Negative) Urobilinogen: 0.2   (Normal Range: 0-1) Nitrite: negative   (Normal Range: Negative) Leukocyte Esterace: trace   (Normal Range: Negative)     Blood Tests     HGBA1C: 8.5%   (Normal Range: Non-Diabetic - 3-6%   Control Diabetic - 6-8%) CBG Random:: 271    Wet Mount/KOH  Negative whiff

## 2010-03-14 NOTE — Letter (Signed)
Summary: Generic Letter  [facility]  [address] [place], [place] [address]   Phone: [phone]  Fax: [phone]    01/30/2010  Ms. Dawn Management consultant of [facility]  Re:  MARYMARGARET KIRKER      [address]      [place], [place]  [address]  Dear [name]:  Ms. Strupp was seen in follow up 12/26/09.  She had just established with PT for her cervical radiculopathy at that time.  She was to schedule a follow up appt. in 2 months following that appt.  I do not see in our system that that appt. has been scheduled.  I will have our office call her to set that up.  In the interim, her prognosis is unknown until we can see if PT improves her situation.  If she does not have improvement, will likely refer her to Neurosurgery.  Because of her continued pain in her left arm and the possibility that physical patient care may cause a sudden increase in pain with possible result of injury to the patient or Ms. Huyett, it would be best that she not be involved in direct patient care at this time.  If you have a position that does not include lifting or turning patients or anything of weight greater than 5 lbs, she could certainly be considered for that job.      Sincerely,   Julieanne Manson MD

## 2010-04-26 LAB — BASIC METABOLIC PANEL
BUN: 7 mg/dL (ref 6–23)
CO2: 29 mEq/L (ref 19–32)
CO2: 31 mEq/L (ref 19–32)
Calcium: 8.9 mg/dL (ref 8.4–10.5)
Calcium: 9.2 mg/dL (ref 8.4–10.5)
Chloride: 101 mEq/L (ref 96–112)
Chloride: 101 mEq/L (ref 96–112)
Chloride: 103 mEq/L (ref 96–112)
Creatinine, Ser: 0.86 mg/dL (ref 0.4–1.2)
GFR calc Af Amer: >60 mL/min (ref >60)
GFR calc Af Amer: >60 mL/min (ref >60)
GFR calc non Af Amer: 59 mL/min — ABNORMAL LOW (ref >60)
Glucose, Bld: 152 mg/dL — ABNORMAL HIGH (ref 70–99)
Glucose, Bld: 183 mg/dL — ABNORMAL HIGH (ref 70–99)
Potassium: 3.8 mEq/L (ref 3.5–5.1)
Potassium: 4.1 mEq/L (ref 3.5–5.1)
Potassium: 4.2 mEq/L (ref 3.5–5.1)
Sodium: 137 mEq/L (ref 135–145)
Sodium: 140 mEq/L (ref 135–145)
Sodium: 142 mEq/L (ref 135–145)

## 2010-04-26 LAB — DIFFERENTIAL
Basophils Absolute: 0 K/uL (ref 0.0–0.1)
Basophils Absolute: 0 K/uL (ref 0.0–0.1)
Basophils Relative: 0 % (ref 0–1)
Basophils Relative: 0 % (ref 0–1)
Basophils Relative: 1 % (ref 0–1)
Eosinophils Absolute: 0.1 K/uL (ref 0.0–0.7)
Eosinophils Absolute: 0.3 K/uL (ref 0.0–0.7)
Eosinophils Absolute: 0.5 10*3/uL (ref 0.0–0.7)
Eosinophils Relative: 1 % (ref 0–5)
Eosinophils Relative: 4 % (ref 0–5)
Eosinophils Relative: 7 % — ABNORMAL HIGH (ref 0–5)
Lymphocytes Relative: 25 % (ref 12–46)
Lymphocytes Relative: 28 % (ref 12–46)
Lymphs Abs: 1.9 K/uL (ref 0.7–4.0)
Lymphs Abs: 2 K/uL (ref 0.7–4.0)
Lymphs Abs: 2.7 10*3/uL (ref 0.7–4.0)
Monocytes Absolute: 0.4 10*3/uL (ref 0.1–1.0)
Monocytes Absolute: 0.5 K/uL (ref 0.1–1.0)
Monocytes Absolute: 1.1 K/uL — ABNORMAL HIGH (ref 0.1–1.0)
Monocytes Relative: 14 % — ABNORMAL HIGH (ref 3–12)
Monocytes Relative: 5 % (ref 3–12)
Monocytes Relative: 7 % (ref 3–12)
Neutro Abs: 4.4 K/uL (ref 1.7–7.7)
Neutro Abs: 4.6 K/uL (ref 1.7–7.7)
Neutrophils Relative %: 58 % (ref 43–77)
Neutrophils Relative %: 64 % (ref 43–77)

## 2010-04-26 LAB — GLUCOSE, CAPILLARY
Glucose-Capillary: 109 mg/dL — ABNORMAL HIGH (ref 70–99)
Glucose-Capillary: 111 mg/dL — ABNORMAL HIGH (ref 70–99)
Glucose-Capillary: 116 mg/dL — ABNORMAL HIGH (ref 70–99)
Glucose-Capillary: 117 mg/dL — ABNORMAL HIGH (ref 70–99)
Glucose-Capillary: 125 mg/dL — ABNORMAL HIGH (ref 70–99)
Glucose-Capillary: 126 mg/dL — ABNORMAL HIGH (ref 70–99)
Glucose-Capillary: 131 mg/dL — ABNORMAL HIGH (ref 70–99)
Glucose-Capillary: 133 mg/dL — ABNORMAL HIGH (ref 70–99)
Glucose-Capillary: 134 mg/dL — ABNORMAL HIGH (ref 70–99)
Glucose-Capillary: 143 mg/dL — ABNORMAL HIGH (ref 70–99)
Glucose-Capillary: 153 mg/dL — ABNORMAL HIGH (ref 70–99)
Glucose-Capillary: 154 mg/dL — ABNORMAL HIGH (ref 70–99)
Glucose-Capillary: 257 mg/dL — ABNORMAL HIGH (ref 70–99)
Glucose-Capillary: 87 mg/dL (ref 70–99)
Glucose-Capillary: 89 mg/dL (ref 70–99)
Glucose-Capillary: 91 mg/dL (ref 70–99)
Glucose-Capillary: 97 mg/dL (ref 70–99)
Glucose-Capillary: 97 mg/dL (ref 70–99)

## 2010-04-26 LAB — URINALYSIS, ROUTINE W REFLEX MICROSCOPIC
Glucose, UA: NEGATIVE mg/dL
Hgb urine dipstick: NEGATIVE
Ketones, ur: >80 mg/dL — AB
Leukocytes, UA: NEGATIVE
Nitrite: NEGATIVE
Nitrite: POSITIVE — AB
Protein, ur: 30 mg/dL — AB
Specific Gravity, Urine: 1.024 (ref 1.005–1.030)
Urobilinogen, UA: 2 mg/dL — ABNORMAL HIGH (ref 0.0–1.0)
pH: 6.5 (ref 5.0–8.0)
pH: 7 (ref 5.0–8.0)

## 2010-04-26 LAB — COMPREHENSIVE METABOLIC PANEL
AST: 336 U/L — ABNORMAL HIGH (ref 0–37)
Albumin: 4 g/dL (ref 3.5–5.2)
Alkaline Phosphatase: 97 U/L (ref 39–117)
BUN: 11 mg/dL (ref 6–23)
BUN: 6 mg/dL (ref 6–23)
Calcium: 8.9 mg/dL (ref 8.4–10.5)
Chloride: 101 mEq/L (ref 96–112)
Creatinine, Ser: 1.01 mg/dL (ref 0.4–1.2)
Creatinine, Ser: 1.03 mg/dL (ref 0.4–1.2)
GFR calc Af Amer: >60 mL/min (ref >60)
Glucose, Bld: 122 mg/dL — ABNORMAL HIGH (ref 70–99)
Potassium: 4.8 mEq/L (ref 3.5–5.1)
Total Bilirubin: 1.1 mg/dL (ref 0.3–1.2)
Total Protein: 6.9 g/dL (ref 6.0–8.3)
Total Protein: 7.9 g/dL (ref 6.0–8.3)

## 2010-04-26 LAB — CBC
HCT: 38 % (ref 36.0–46.0)
HCT: 39.6 % (ref 36.0–46.0)
HCT: 39.8 % (ref 36.0–46.0)
HCT: 40.5 % (ref 36.0–46.0)
HCT: 41.9 % (ref 36.0–46.0)
Hemoglobin: 12.5 g/dL (ref 12.0–15.0)
Hemoglobin: 13.1 g/dL (ref 12.0–15.0)
Hemoglobin: 13.7 g/dL (ref 12.0–15.0)
Hemoglobin: 13.8 g/dL (ref 12.0–15.0)
Hemoglobin: 14.8 g/dL (ref 12.0–15.0)
MCH: 30.5 pg (ref 26.0–34.0)
MCH: 30.7 pg (ref 26.0–34.0)
MCH: 31.4 pg (ref 26.0–34.0)
MCHC: 34.3 g/dL (ref 30.0–36.0)
MCHC: 34.4 g/dL (ref 30.0–36.0)
MCHC: 34.6 g/dL (ref 30.0–36.0)
MCHC: 34.6 g/dL (ref 30.0–36.0)
MCHC: 34.8 g/dL (ref 30.0–36.0)
MCHC: 35.3 g/dL (ref 30.0–36.0)
MCV: 86.9 fL (ref 78.0–100.0)
MCV: 87.4 fL (ref 78.0–100.0)
MCV: 88.1 fL (ref 78.0–100.0)
MCV: 89.7 fL (ref 78.0–100.0)
MCV: 90.9 fL (ref 78.0–100.0)
Platelets: 217 10*3/uL (ref 150–400)
Platelets: 219 10*3/uL (ref 150–400)
Platelets: 223 10*3/uL (ref 150–400)
Platelets: 227 K/uL (ref 150–400)
Platelets: 243 K/uL (ref 150–400)
RBC: 4.18 MIL/uL (ref 3.87–5.11)
RBC: 4.18 MIL/uL (ref 3.87–5.11)
RBC: 4.53 MIL/uL (ref 3.87–5.11)
RBC: 4.82 MIL/uL (ref 3.87–5.11)
RDW: 12.9 % (ref 11.5–15.5)
RDW: 12.9 % (ref 11.5–15.5)
RDW: 12.9 % (ref 11.5–15.5)
RDW: 13.1 % (ref 11.5–15.5)
RDW: 13.3 % (ref 11.5–15.5)
WBC: 5 K/uL (ref 4.0–10.5)
WBC: 5.5 10*3/uL (ref 4.0–10.5)
WBC: 7.2 K/uL (ref 4.0–10.5)
WBC: 7.6 10*3/uL (ref 4.0–10.5)
WBC: 8.2 10*3/uL (ref 4.0–10.5)

## 2010-04-26 LAB — CARDIAC PANEL(CRET KIN+CKTOT+MB+TROPI)
Relative Index: 1 (ref 0.0–2.5)
Relative Index: 1.1 (ref 0.0–2.5)
Relative Index: 1.3 (ref 0.0–2.5)
Total CK: 127 U/L (ref 7–177)
Troponin I: <0.01 ng/mL (ref 0.00–0.06)

## 2010-04-26 LAB — HEPATITIS PANEL, ACUTE
HCV Ab: NEGATIVE
Hep A IgM: NEGATIVE
Hep B C IgM: NEGATIVE
Hepatitis B Surface Ag: NEGATIVE

## 2010-04-26 LAB — URINE MICROSCOPIC-ADD ON

## 2010-04-26 LAB — HEPARIN LEVEL (UNFRACTIONATED): Heparin Unfractionated: 0.47 IU/mL (ref 0.30–0.70)

## 2010-04-26 LAB — POCT CARDIAC MARKERS
CKMB, poc: 7.9 ng/mL (ref 1.0–8.0)
Troponin i, poc: <0.05 ng/mL (ref 0.00–0.09)

## 2010-04-26 LAB — MRSA PCR SCREENING: MRSA by PCR: NEGATIVE

## 2010-04-26 LAB — PROTIME-INR
INR: 1.02 (ref 0.00–1.49)
Prothrombin Time: 13.6 seconds (ref 11.6–15.2)

## 2010-04-26 LAB — LIPASE, BLOOD: Lipase: 51 U/L (ref 11–59)

## 2010-04-26 LAB — URINE CULTURE

## 2010-04-26 LAB — AMYLASE: Amylase: 115 U/L — ABNORMAL HIGH (ref 0–105)

## 2010-04-29 LAB — DIFFERENTIAL
Basophils Absolute: 0.2 10*3/uL — ABNORMAL HIGH (ref 0.0–0.1)
Basophils Relative: 2 % — ABNORMAL HIGH (ref 0–1)
Eosinophils Absolute: 0.3 10*3/uL (ref 0.0–0.7)
Eosinophils Relative: 4 % (ref 0–5)
Lymphs Abs: 4 10*3/uL (ref 0.7–4.0)
Neutrophils Relative %: 41 % — ABNORMAL LOW (ref 43–77)

## 2010-04-29 LAB — POCT CARDIAC MARKERS
CKMB, poc: 1.6 ng/mL (ref 1.0–8.0)
Myoglobin, poc: 26.6 ng/mL (ref 12–200)
Troponin i, poc: <0.05 ng/mL (ref 0.00–0.09)

## 2010-04-29 LAB — CARDIAC PANEL(CRET KIN+CKTOT+MB+TROPI)
CK, MB: 1.5 ng/mL (ref 0.3–4.0)
CK, MB: 1.5 ng/mL (ref 0.3–4.0)
Relative Index: 1 (ref 0.0–2.5)
Total CK: 143 U/L (ref 7–177)
Troponin I: 0.01 ng/mL (ref 0.00–0.06)
Troponin I: 0.01 ng/mL (ref 0.00–0.06)

## 2010-04-29 LAB — BASIC METABOLIC PANEL
BUN: 11 mg/dL (ref 6–23)
BUN: 8 mg/dL (ref 6–23)
Calcium: 8.9 mg/dL (ref 8.4–10.5)
Chloride: 106 mEq/L (ref 96–112)
Creatinine, Ser: 0.9 mg/dL (ref 0.4–1.2)
Creatinine, Ser: 0.96 mg/dL (ref 0.4–1.2)
GFR calc non Af Amer: >60 mL/min (ref >60)
Glucose, Bld: 171 mg/dL — ABNORMAL HIGH (ref 70–99)
Sodium: 138 mEq/L (ref 135–145)

## 2010-04-29 LAB — GLUCOSE, CAPILLARY
Glucose-Capillary: 155 mg/dL — ABNORMAL HIGH (ref 70–99)
Glucose-Capillary: 163 mg/dL — ABNORMAL HIGH (ref 70–99)
Glucose-Capillary: 207 mg/dL — ABNORMAL HIGH (ref 70–99)
Glucose-Capillary: 222 mg/dL — ABNORMAL HIGH (ref 70–99)
Glucose-Capillary: 235 mg/dL — ABNORMAL HIGH (ref 70–99)
Glucose-Capillary: 253 mg/dL — ABNORMAL HIGH (ref 70–99)

## 2010-04-29 LAB — CBC
Hemoglobin: 12.1 g/dL (ref 12.0–15.0)
MCHC: 33.2 g/dL (ref 30.0–36.0)
MCHC: 33.9 g/dL (ref 30.0–36.0)
MCV: 90.8 fL (ref 78.0–100.0)
Platelets: 208 10*3/uL (ref 150–400)
Platelets: 258 10*3/uL (ref 150–400)
RDW: 13 % (ref 11.5–15.5)
WBC: 8.5 10*3/uL (ref 4.0–10.5)

## 2010-04-29 LAB — HEMOGLOBIN A1C: Mean Plasma Glucose: 229 mg/dL — ABNORMAL HIGH (ref <117)

## 2010-04-29 LAB — D-DIMER, QUANTITATIVE: D-Dimer, Quant: 0.37 ug/mL-FEU (ref 0.00–0.48)

## 2010-04-29 LAB — DRUGS OF ABUSE SCREEN W/O ALC, ROUTINE URINE
Cocaine Metabolites: NEGATIVE
Marijuana Metabolite: POSITIVE — AB
Methadone: NEGATIVE
Opiate Screen, Urine: NEGATIVE
Phencyclidine (PCP): NEGATIVE

## 2010-04-29 LAB — LIPID PANEL
HDL: 40 mg/dL (ref >39)
VLDL: 12 mg/dL (ref 0–40)

## 2010-04-29 LAB — MRSA PCR SCREENING: MRSA by PCR: NEGATIVE

## 2010-04-29 LAB — THC (MARIJUANA), URINE, CONFIRMATION: Marijuana, Ur-Confirmation: 553 NG/ML — ABNORMAL HIGH

## 2010-08-12 ENCOUNTER — Ambulatory Visit (HOSPITAL_BASED_OUTPATIENT_CLINIC_OR_DEPARTMENT_OTHER): Payer: Self-pay

## 2010-09-08 ENCOUNTER — Ambulatory Visit (HOSPITAL_BASED_OUTPATIENT_CLINIC_OR_DEPARTMENT_OTHER): Payer: Self-pay | Attending: Family Medicine

## 2010-09-08 DIAGNOSIS — G4700 Insomnia, unspecified: Secondary | ICD-10-CM | POA: Insufficient documentation

## 2010-09-14 DIAGNOSIS — G4730 Sleep apnea, unspecified: Secondary | ICD-10-CM

## 2010-09-14 DIAGNOSIS — G4700 Insomnia, unspecified: Secondary | ICD-10-CM

## 2010-09-14 NOTE — Procedures (Signed)
NAME:  [name], [name] NO.:  [id number]  MEDICAL RECORD NO.:  [id number]          PATIENT TYPE:  OUT  LOCATION:  SLEEP CENTER                 FACILITY:  [facility]  PHYSICIAN:  Jhordan Mckibben D. Maple Hudson, MD, FCCP, FACPDATE OF BIRTH:  04-14-1964  DATE OF STUDY:  09/08/2010                           NOCTURNAL POLYSOMNOGRAM  REFERRING PHYSICIAN:  AMELIA WILSON  REFERRING PHYSICIAN:  Georganna Skeans, MD  INDICATION FOR STUDY:  Insomnia with sleep apnea.  EPWORTH SLEEPINESS SCORE:  8/24.  BMI 30.5.  Weight 225 pounds.  Height 72 inches.  Neck 16.5 inches.  MEDICATIONS:  Home medications are charted and reviewed.  SLEEP ARCHITECTURE:  Total sleep time 305 minutes with sleep efficiency 84.7%.  Stage I was 6.7%, stage II 81%, stage III absent, REM 12.3% of total sleep time.  Sleep latency 39.5 minutes, REM latency 103 minutes, awake after sleep onset 13.5 minutes, arousal index 8.5.  BEDTIME MEDICATION:  Carvedilol, Protonix, hydroxyzine, NovoLog.  RESPIRATORY DATA:  Apnea/hypopnea index (AHI) 0.6 per hour.  A total of three events was scored, all as hypopneas seen while supine and non- supine.  This is a diagnostic NPSG study and no CPAP titration was done.  OXYGEN DATA:  Moderate snoring with oxygen desaturation to a nadir of 88% and a mean oxygen saturation through the study of 94.5% on room air.  CARDIAC DATA:  Normal sinus rhythm.  MOVEMENT-PARASOMNIA:  No significant movement disturbance.  No bathroom trips.  IMPRESSIONS-RECOMMENDATIONS: 1. Unremarkable sleep architecture for Sleep Center environment.     Bedtime medication included hydroxyzine, which may be sedating. 2. Rare respiratory event with sleep disturbance, within normal     limits.  AHI 0.6 per hour (normal range is 0-5 per     hour).  Moderate snoring with oxygen desaturation to a nadir of 88%     and a mean oxygen saturation through the study of 94.5% on room     air.  No specific therapeutic indication  from this study.     Hitoshi Werts D. Maple Hudson, MD, [facility], FACP Diplomate, Biomedical engineer of Sleep Medicine Electronically Signed    CDY/MEDQ  D:  09/14/2010 09:04:41  T:  09/14/2010 09:50:46  Job:  [id number]

## 2010-09-30 ENCOUNTER — Other Ambulatory Visit: Payer: Self-pay

## 2011-06-25 DIAGNOSIS — R339 Retention of urine, unspecified: Secondary | ICD-10-CM

## 2011-06-25 HISTORY — DX: Retention of urine, unspecified: R33.9

## 2011-11-13 ENCOUNTER — Emergency Department (HOSPITAL_BASED_OUTPATIENT_CLINIC_OR_DEPARTMENT_OTHER)
Admission: EM | Admit: 2011-11-13 | Discharge: 2011-11-13 | Disposition: A | Discharge status: Home / Self Care | Payer: Self-pay | Attending: Emergency Medicine | Admitting: Emergency Medicine

## 2011-11-13 ENCOUNTER — Encounter (HOSPITAL_BASED_OUTPATIENT_CLINIC_OR_DEPARTMENT_OTHER): Payer: Self-pay | Admitting: *Deleted

## 2011-11-13 ENCOUNTER — Emergency Department (HOSPITAL_BASED_OUTPATIENT_CLINIC_OR_DEPARTMENT_OTHER): Payer: Self-pay

## 2011-11-13 DIAGNOSIS — Z91040 Latex allergy status: Secondary | ICD-10-CM | POA: Insufficient documentation

## 2011-11-13 DIAGNOSIS — R6883 Chills (without fever): Secondary | ICD-10-CM

## 2011-11-13 DIAGNOSIS — F3289 Other specified depressive episodes: Secondary | ICD-10-CM | POA: Insufficient documentation

## 2011-11-13 DIAGNOSIS — R739 Hyperglycemia, unspecified: Secondary | ICD-10-CM

## 2011-11-13 DIAGNOSIS — F329 Major depressive disorder, single episode, unspecified: Secondary | ICD-10-CM | POA: Insufficient documentation

## 2011-11-13 DIAGNOSIS — E119 Type 2 diabetes mellitus without complications: Secondary | ICD-10-CM | POA: Insufficient documentation

## 2011-11-13 DIAGNOSIS — R51 Headache: Secondary | ICD-10-CM

## 2011-11-13 DIAGNOSIS — Z794 Long term (current) use of insulin: Secondary | ICD-10-CM | POA: Insufficient documentation

## 2011-11-13 DIAGNOSIS — Z7982 Long term (current) use of aspirin: Secondary | ICD-10-CM | POA: Insufficient documentation

## 2011-11-13 DIAGNOSIS — K219 Gastro-esophageal reflux disease without esophagitis: Secondary | ICD-10-CM | POA: Insufficient documentation

## 2011-11-13 DIAGNOSIS — Z885 Allergy status to narcotic agent status: Secondary | ICD-10-CM | POA: Insufficient documentation

## 2011-11-13 DIAGNOSIS — G589 Mononeuropathy, unspecified: Secondary | ICD-10-CM | POA: Insufficient documentation

## 2011-11-13 HISTORY — DX: Major depressive disorder, single episode, unspecified: F32.9

## 2011-11-13 HISTORY — DX: Atherosclerotic heart disease of native coronary artery without angina pectoris: I25.10

## 2011-11-13 HISTORY — DX: Gastro-esophageal reflux disease without esophagitis: K21.9

## 2011-11-13 HISTORY — DX: Depression, unspecified: F32.A

## 2011-11-13 HISTORY — DX: Polyneuropathy, unspecified: G62.9

## 2011-11-13 LAB — CBC WITH DIFFERENTIAL/PLATELET
Basophils Relative: 0 % (ref 0–1)
Eosinophils Absolute: 0.3 10*3/uL (ref 0.0–0.7)
Eosinophils Relative: 3 % (ref 0–5)
Hemoglobin: 12.9 g/dL (ref 12.0–15.0)
Lymphs Abs: 2 10*3/uL (ref 0.7–4.0)
MCH: 29.9 pg (ref 26.0–34.0)
MCHC: 34.2 g/dL (ref 30.0–36.0)
MCV: 87.3 fL (ref 78.0–100.0)
Monocytes Absolute: 0.5 10*3/uL (ref 0.1–1.0)
Monocytes Relative: 6 % (ref 3–12)
RBC: 4.32 MIL/uL (ref 3.87–5.11)

## 2011-11-13 LAB — BASIC METABOLIC PANEL
BUN: 20 mg/dL (ref 6–23)
Calcium: 9.3 mg/dL (ref 8.4–10.5)
Creatinine, Ser: 1.4 mg/dL — ABNORMAL HIGH (ref 0.50–1.10)
GFR calc Af Amer: 51 mL/min — ABNORMAL LOW (ref >90)
GFR calc non Af Amer: 44 mL/min — ABNORMAL LOW (ref >90)
Glucose, Bld: 203 mg/dL — ABNORMAL HIGH (ref 70–99)

## 2011-11-13 LAB — URINALYSIS, ROUTINE W REFLEX MICROSCOPIC
Bilirubin Urine: NEGATIVE
Hgb urine dipstick: NEGATIVE
Ketones, ur: NEGATIVE mg/dL
Protein, ur: NEGATIVE mg/dL
Specific Gravity, Urine: 1.023 (ref 1.005–1.030)
Urobilinogen, UA: 0.2 mg/dL (ref 0.0–1.0)

## 2011-11-13 MED ORDER — TRAMADOL HCL 50 MG PO TABS: 50.0000 mg | ORAL_TABLET | Freq: Four times a day (QID) | ORAL | Status: DC | PRN

## 2011-11-13 MED ORDER — NAPROXEN 500 MG PO TABS: 500.0000 mg | ORAL_TABLET | Freq: Two times a day (BID) | ORAL | Status: DC

## 2011-11-13 NOTE — ED Notes (Signed)
Felt cold yesterday took her insulin and "ate a little bit" fell asleep woke up several times "soaking wet with sweat" states didn't check her blood sugar as she felt weak this morning has a headache near left temple

## 2011-11-13 NOTE — ED Notes (Signed)
Discharge instructions reviewed. Pt verbalized understanding.  

## 2011-11-13 NOTE — ED Provider Notes (Signed)
History    CSN: [id number] Arrival date & time 11/13/11  1004 First MD Initiated Contact with Patient 11/13/11 1023      Chief Complaint  Patient presents with  . shaking and sweating during night     HPI She started to feel chilled yesterday.  She took her medications as usual.  She went to take a nap and she woke up feeling sweaty.  Pt did not check her blood sugar but ate something in case her blood sugar was low.  Last night the diaphoresis continues.  This Am she had a slight headache and felt like her tongue was a little numb but that resolved after a short period of time.  She has not measured a fever.She did check her blood sugars today and it was in the 200s.  No chest pain, no shortness of breath.  She has been coughing a little.  She has also noticed some urinary frequency.  Other than the mild headache, on the left side of her head and neck, no other pain.  No trouble with speech, coordination, or movement.  Past Medical History  Diagnosis Date  . Diabetes mellitus   . Depression   . Neuropathy   . GERD (gastroesophageal reflux disease)     History reviewed. No pertinent past surgical history.  No family history on file.  History  Substance Use Topics  . Smoking status: Not on file  . Smokeless tobacco: Not on file  . Alcohol Use:     OB History    Grav Para Term Preterm Abortions TAB SAB Ect Mult Living                  Review of Systems  All other systems reviewed and are negative.    Allergies  Latex and Morphine  Home Medications   Current Outpatient Rx  Name Route Sig Dispense Refill  . ASPIRIN 81 MG PO TABS Oral Take 81 mg by mouth daily.    Marland Kitchen CARVEDILOL 12.5 MG PO TABS Oral Take 12.5 mg by mouth 2 (two) times daily with a meal.    . CITALOPRAM HYDROBROMIDE 10 MG PO TABS Oral Take 10 mg by mouth daily.    Marland Kitchen GABAPENTIN 300 MG PO CAPS Oral Take 300 mg by mouth 3 (three) times daily.    . INSULIN ASPART PROT & ASPART (70-30) 100 UNIT/ML [place] SUSP  Subcutaneous Inject into the skin.    Marland Kitchen PANTOPRAZOLE SODIUM 40 MG PO TBEC Oral Take 40 mg by mouth daily.    . TRIAMTERENE-HCTZ 37.5-25 MG PO CAPS Oral Take 1 capsule by mouth every morning.      BP 153/110  Pulse 85  Temp 98.4 F (36.9 C) (Oral)  SpO2 99%  Physical Exam  Nursing note and vitals reviewed. Constitutional: She appears well-developed and well-nourished. No distress.  HENT:  Head: Normocephalic and atraumatic.  Right Ear: External ear normal.  Left Ear: External ear normal.  Eyes: Conjunctivae normal are normal. Right eye exhibits no discharge. Left eye exhibits no discharge. No scleral icterus.  Neck: Normal range of motion. Neck supple. No tracheal deviation present. No thyromegaly present.  Cardiovascular: Normal rate, regular rhythm and intact distal pulses.   Pulmonary/Chest: Effort normal and breath sounds normal. No stridor. No respiratory distress. She has no wheezes. She has no rales.  Abdominal: Soft. Bowel sounds are normal. She exhibits no distension. There is no tenderness. There is no rebound and no guarding.  Musculoskeletal: She exhibits no edema  and no tenderness.  Lymphadenopathy:    She has no cervical adenopathy.  Neurological: She is alert. She has normal strength. No sensory deficit. Cranial nerve deficit:  no gross defecits noted. She exhibits normal muscle tone. She displays no seizure activity. Coordination normal.  Skin: Skin is warm and dry. No rash noted.  Psychiatric: She has a normal mood and affect.    ED Course  Procedures (including critical care time)  Labs Reviewed  GLUCOSE, CAPILLARY - Abnormal; Notable for the following:    Glucose-Capillary 178 (*)     All other components within normal limits  BASIC METABOLIC PANEL - Abnormal; Notable for the following:    Glucose, Bld 203 (*)     Creatinine, Ser 1.40 (*)     GFR calc non Af Amer 44 (*)     GFR calc Af Amer 51 (*)     All other components within normal limits  CBC WITH  DIFFERENTIAL  URINALYSIS, ROUTINE W REFLEX MICROSCOPIC   Dg Chest 2 View  11/13/2011  *RADIOLOGY REPORT*  Clinical Data: Chest pain and shortness of breath.  CHEST - 2 VIEW  Comparison: Plain film of the chest 09/10/2009 and CT chest 07/19/2009.  Findings: Lungs are clear.  Heart size is normal.  No pneumothorax or pleural fluid.  IMPRESSION: Negative chest.   Original Report Authenticated By: Bernadene Bell. D'ALESSIO, M.D.      1. Chills (without fever)   2. Hyperglycemia   3. Headache       MDM  Patient's creatinine level is elevated in comparison to 2 years ago but again I think is related to the patient's symptoms today. She describes feeling chilled and cold. There doesn't appear to be evidence of an acute bacterial infection on exam on evaluation today in emergency department. Patient does not have any neck stiffness or signs of meningitis. Patient does have a headache not worried about an acute emergency medical condition associated with that. It is possible that she has a viral infection. Symptoms could be related to thyroid issues however with the acute nature of her symptoms this is less likely. I will have her take over-the-counter antipyretics for comfort. I recommend following up with her primary Dr. if her symptoms persist. Warning signs were discussed that should prompt return to the emergency department.       Celene Kras, MD 11/13/11 [phone]

## 2012-03-29 ENCOUNTER — Emergency Department (HOSPITAL_BASED_OUTPATIENT_CLINIC_OR_DEPARTMENT_OTHER): Payer: 59

## 2012-03-29 ENCOUNTER — Encounter (HOSPITAL_BASED_OUTPATIENT_CLINIC_OR_DEPARTMENT_OTHER): Payer: Self-pay | Admitting: *Deleted

## 2012-03-29 ENCOUNTER — Emergency Department (HOSPITAL_BASED_OUTPATIENT_CLINIC_OR_DEPARTMENT_OTHER)
Admission: EM | Admit: 2012-03-29 | Discharge: 2012-03-29 | Disposition: A | Discharge status: Home / Self Care | Payer: 59 | Attending: Emergency Medicine | Admitting: Emergency Medicine

## 2012-03-29 DIAGNOSIS — Z3202 Encounter for pregnancy test, result negative: Secondary | ICD-10-CM | POA: Insufficient documentation

## 2012-03-29 DIAGNOSIS — R6883 Chills (without fever): Secondary | ICD-10-CM | POA: Insufficient documentation

## 2012-03-29 DIAGNOSIS — E1149 Type 2 diabetes mellitus with other diabetic neurological complication: Secondary | ICD-10-CM | POA: Insufficient documentation

## 2012-03-29 DIAGNOSIS — H6690 Otitis media, unspecified, unspecified ear: Secondary | ICD-10-CM | POA: Insufficient documentation

## 2012-03-29 DIAGNOSIS — R0789 Other chest pain: Secondary | ICD-10-CM | POA: Insufficient documentation

## 2012-03-29 DIAGNOSIS — I2510 Atherosclerotic heart disease of native coronary artery without angina pectoris: Secondary | ICD-10-CM | POA: Insufficient documentation

## 2012-03-29 DIAGNOSIS — R197 Diarrhea, unspecified: Secondary | ICD-10-CM | POA: Insufficient documentation

## 2012-03-29 DIAGNOSIS — Z794 Long term (current) use of insulin: Secondary | ICD-10-CM | POA: Insufficient documentation

## 2012-03-29 DIAGNOSIS — R739 Hyperglycemia, unspecified: Secondary | ICD-10-CM

## 2012-03-29 DIAGNOSIS — E1169 Type 2 diabetes mellitus with other specified complication: Secondary | ICD-10-CM | POA: Insufficient documentation

## 2012-03-29 DIAGNOSIS — R112 Nausea with vomiting, unspecified: Secondary | ICD-10-CM | POA: Insufficient documentation

## 2012-03-29 DIAGNOSIS — Z79899 Other long term (current) drug therapy: Secondary | ICD-10-CM | POA: Insufficient documentation

## 2012-03-29 DIAGNOSIS — K219 Gastro-esophageal reflux disease without esophagitis: Secondary | ICD-10-CM | POA: Insufficient documentation

## 2012-03-29 DIAGNOSIS — R51 Headache: Secondary | ICD-10-CM | POA: Insufficient documentation

## 2012-03-29 DIAGNOSIS — F329 Major depressive disorder, single episode, unspecified: Secondary | ICD-10-CM | POA: Insufficient documentation

## 2012-03-29 DIAGNOSIS — E1142 Type 2 diabetes mellitus with diabetic polyneuropathy: Secondary | ICD-10-CM | POA: Insufficient documentation

## 2012-03-29 DIAGNOSIS — H6590 Unspecified nonsuppurative otitis media, unspecified ear: Secondary | ICD-10-CM

## 2012-03-29 DIAGNOSIS — Z7982 Long term (current) use of aspirin: Secondary | ICD-10-CM | POA: Insufficient documentation

## 2012-03-29 DIAGNOSIS — F3289 Other specified depressive episodes: Secondary | ICD-10-CM | POA: Insufficient documentation

## 2012-03-29 LAB — URINALYSIS, ROUTINE W REFLEX MICROSCOPIC
Nitrite: NEGATIVE
Specific Gravity, Urine: 1.028 (ref 1.005–1.030)
Urobilinogen, UA: 1 mg/dL (ref 0.0–1.0)

## 2012-03-29 LAB — CBC WITH DIFFERENTIAL/PLATELET
Basophils Relative: 0 % (ref 0–1)
Eosinophils Absolute: 0.3 10*3/uL (ref 0.0–0.7)
Eosinophils Relative: 4 % (ref 0–5)
Lymphs Abs: 3.3 10*3/uL (ref 0.7–4.0)
MCH: 29.9 pg (ref 26.0–34.0)
MCHC: 34.9 g/dL (ref 30.0–36.0)
MCV: 85.5 fL (ref 78.0–100.0)
Platelets: 247 10*3/uL (ref 150–400)
RBC: 4.35 MIL/uL (ref 3.87–5.11)

## 2012-03-29 LAB — COMPREHENSIVE METABOLIC PANEL
Albumin: 4.1 g/dL (ref 3.5–5.2)
BUN: 17 mg/dL (ref 6–23)
Calcium: 9.8 mg/dL (ref 8.4–10.5)
GFR calc Af Amer: 68 mL/min — ABNORMAL LOW (ref >90)
Glucose, Bld: 343 mg/dL — ABNORMAL HIGH (ref 70–99)
Sodium: 135 mEq/L (ref 135–145)
Total Protein: 8 g/dL (ref 6.0–8.3)

## 2012-03-29 LAB — PREGNANCY, URINE: Preg Test, Ur: NEGATIVE

## 2012-03-29 LAB — TROPONIN I: Troponin I: <0.3 ng/mL (ref <0.30)

## 2012-03-29 LAB — URINE MICROSCOPIC-ADD ON

## 2012-03-29 MED ORDER — KETOROLAC TROMETHAMINE 30 MG/ML IJ SOLN
30.0000 mg | Freq: Once | INTRAMUSCULAR | Status: AC
  Administered 2012-03-29: 30 mg via INTRAVENOUS
  Filled 2012-03-29: qty 1

## 2012-03-29 MED ORDER — SODIUM CHLORIDE 0.9 % IV BOLUS (SEPSIS)
1000.0000 mL | Freq: Once | INTRAVENOUS | Status: AC
  Administered 2012-03-29: 1000 mL via INTRAVENOUS

## 2012-03-29 MED ORDER — ONDANSETRON HCL 4 MG/2ML IJ SOLN
4.0000 mg | Freq: Once | INTRAMUSCULAR | Status: AC
  Administered 2012-03-29: 4 mg via INTRAVENOUS
  Filled 2012-03-29: qty 2

## 2012-03-29 MED ORDER — FLUTICASONE PROPIONATE 50 MCG/ACT NA SUSP: 2.0000 | Freq: Every day | NASAL | Status: DC

## 2012-03-29 MED ORDER — PSEUDOEPHEDRINE HCL ER 120 MG PO TB12: 120.0000 mg | ORAL_TABLET | Freq: Two times a day (BID) | ORAL | Status: DC

## 2012-03-29 MED ORDER — HYDROMORPHONE HCL PF 1 MG/ML IJ SOLN
0.5000 mg | Freq: Once | INTRAMUSCULAR | Status: AC
  Administered 2012-03-29: 0.5 mg via INTRAVENOUS
  Filled 2012-03-29: qty 1

## 2012-03-29 NOTE — ED Provider Notes (Addendum)
History  This chart was scribed for Gerhard Munch, MD by Bennett Scrape, ED Scribe. This patient was seen in room MH02/MH02 and the patient's care was started at 7:30 PM.  CSN: [id number]  Arrival date & time 03/29/12  4098   First MD Initiated Contact with Patient 03/29/12 1930      Chief Complaint  Patient presents with  . Otalgia     The history is provided by the patient. No language interpreter was used.    [name] is a 47 y.o. female who presents to the Emergency Department complaining of 24 hours of gradual onset, gradually worsening, constant of constant otalgia in the left ear described as throbbing, diffuse HA described throbbing and sternal CP described as soreness with associated chills, nausea, emesis and diarrhea. The pains are aggravated by sitting up and are improved with rest. She reports trying nasal spray with no improvement. She states that she believe that symptoms are a continuation of a cold with associated symptoms of sinus fullness, cough and congestion that she had last week that improved with mucinex. She denies fevers, leg swelling, SOB, visual disturbances and confusion as associated symptoms. She has a h/o DM for which she takes novolog daily for and CAD for which she takes ASA, carvedilol and triamterene-hydrochlorothiazide for. She denies having a prior stent placement or MI. She is an occasional alcohol user but denies smoking.  No current PCP.   Past Medical History  Diagnosis Date  . Diabetes mellitus   . Depression   . Neuropathy   . GERD (gastroesophageal reflux disease)   . Coronary artery disease     30% lesions noted    Past Surgical History  Procedure Laterality Date  . Abdominal hysterectomy      partial  . Cholecystectomy      No family history on file.  History  Substance Use Topics  . Smoking status: Never Smoker   . Smokeless tobacco: Not on file  . Alcohol Use: Yes    No OB history provided.  Review of  Systems  Constitutional:       Per HPI, otherwise negative  HENT:       Per HPI, otherwise negative  Respiratory:       Per HPI, otherwise negative  Cardiovascular:       Per HPI, otherwise negative  Gastrointestinal: Positive for nausea, vomiting and diarrhea. Negative for abdominal pain.  Endocrine:       Negative aside from HPI  Genitourinary:       Neg aside from HPI   Musculoskeletal:       Per HPI, otherwise negative  Skin: Negative.   Neurological: Positive for headaches. Negative for syncope.    Allergies  Latex and Morphine  Home Medications   Current Outpatient Rx  Name  Route  Sig  Dispense  Refill  . aspirin 81 MG tablet   Oral   Take 81 mg by mouth daily.         . carvedilol (COREG) 12.5 MG tablet   Oral   Take 12.5 mg by mouth 2 (two) times daily with a meal.         . citalopram (CELEXA) 10 MG tablet   Oral   Take 10 mg by mouth daily.         Marland Kitchen gabapentin (NEURONTIN) 300 MG capsule   Oral   Take 300 mg by mouth 3 (three) times daily.         Marland Kitchen  insulin aspart protamine-insulin aspart (NOVOLOG 70/30) (70-30) 100 UNIT/ML injection   Subcutaneous   Inject into the skin.         . naproxen (NAPROSYN) 500 MG tablet   Oral   Take 1 tablet (500 mg total) by mouth 2 (two) times daily.   30 tablet   0   . pantoprazole (PROTONIX) 40 MG tablet   Oral   Take 40 mg by mouth daily.         . traMADol (ULTRAM) 50 MG tablet   Oral   Take 1 tablet (50 mg total) by mouth every 6 (six) hours as needed for pain.   20 tablet   0   . triamterene-hydrochlorothiazide (DYAZIDE) 37.5-25 MG per capsule   Oral   Take 1 capsule by mouth every morning.           Triage Vitals: BP 136/93  Pulse 85  Temp(Src) 98 F (36.7 C) (Oral)  Resp 18  SpO2 100%  Physical Exam  Nursing note and vitals reviewed. Constitutional: She is oriented to person, place, and time. She appears well-developed and well-nourished. No distress.  HENT:  Head:  Normocephalic and atraumatic.  Mouth/Throat: Oropharynx is clear and moist.  Effusion to the left TM, right TM is normal  Eyes: Conjunctivae and EOM are normal. Pupils are equal, round, and reactive to light.  Neck: Neck supple. No tracheal deviation present.  Cardiovascular: Normal rate and regular rhythm.   No murmur heard. Pulmonary/Chest: Effort normal and breath sounds normal. No stridor. No respiratory distress.  Abdominal: She exhibits no distension.  Musculoskeletal: Normal range of motion. She exhibits no edema.  Neurological: She is alert and oriented to person, place, and time. No cranial nerve deficit.  Good grip strength   Skin: Skin is warm and dry.  Psychiatric: She has a normal mood and affect. Her behavior is normal.    ED Course  Procedures (including critical care time)  DIAGNOSTIC STUDIES: Oxygen Saturation is 100% on room air, normal by my interpretation.    COORDINATION OF CARE: 7:47 PM- Discussed treatment plan which includes CXR and EKG with pt at bedside and pt agreed to plan.   8:00 PM- Ordered 1,000 mL of bolus and 30 mg Toradol injection   8:15 PM- Ordered 4 mg Zofran injection  9:15 PM- Ordered 1,000 mL of bolus  10:00 PM ordered 0.5 mg Dilaudid injection  Labs Reviewed  COMPREHENSIVE METABOLIC PANEL - Abnormal; Notable for the following:    Glucose, Bld 343 (*)    GFR calc non Af Amer 59 (*)    GFR calc Af Amer 68 (*)    All other components within normal limits  URINALYSIS, ROUTINE W REFLEX MICROSCOPIC - Abnormal; Notable for the following:    APPearance CLOUDY (*)    Glucose, UA >1000 (*)    Ketones, ur 15 (*)    Leukocytes, UA MODERATE (*)    All other components within normal limits  URINE MICROSCOPIC-ADD ON - Abnormal; Notable for the following:    Bacteria, UA MANY (*)    All other components within normal limits  URINE CULTURE  CBC WITH DIFFERENTIAL  TROPONIN I  PREGNANCY, URINE   Dg Chest 2 View  03/29/2012  *RADIOLOGY  REPORT*  Clinical Data: Cough, congestion.  CHEST - 2 VIEW  Comparison: 11/13/2011  Findings: Vascular clips in the upper abdomen. Lungs clear.  Heart size and pulmonary vascularity normal.  No effusion.  Visualized bones unremarkable.  IMPRESSION: No acute  disease   Original Report Authenticated By: D. Andria Rhein, MD      No diagnosis found.  On several repeat exam the patient appears better, calm when resting.   Date: 03/29/2012  Rate: 80  Rhythm: normal sinus rhythm  QRS Axis: normal  Intervals: normal  ST/T Wave abnormalities: normal  Conduction Disutrbances:none  Narrative Interpretation:   Old EKG Reviewed: none available NORMAL   MDM   I personally performed the services described in this documentation, which was scribed in my presence. The recorded information has been reviewed and is accurate.   The patient presents with bilateral ear discomfort, cough, generalized complaints.  On exam the patient is mentating appropriately, afebrile with unremarkable vital signs.  The patient does have left ear effusion, but an otherwise reassuring physical exam.  Following IV fluids, the patient improved, was resting comfortably.  The patient had hyperglycemia, which is likely contributory to her nausea.  There is no anion gap, but there are mild ketones in her urine.  The patient was discharged in stable condition with Sudafed, fluticasone, PMD followup (she was provided resources to obtain a new physician as needed).  Gerhard Munch, MD 03/29/12 4098  Gerhard Munch, MD 03/29/12 [phone]

## 2012-03-29 NOTE — ED Notes (Addendum)
Earache and indigestion. No relief with Vicodin and ear drops. Nausea. Last week she had a cough, headache and sinus congestion.

## 2012-03-31 LAB — URINE CULTURE

## 2012-04-01 NOTE — ED Notes (Signed)
+   urine Chart sent to EDP office for review. 

## 2012-04-06 ENCOUNTER — Telehealth (HOSPITAL_COMMUNITY): Payer: Self-pay | Admitting: Emergency Medicine

## 2012-04-06 NOTE — ED Notes (Signed)
Chart back from MD office.  Chart reviewed by Dr Effie Shy "call in Rx Keflex 500 mg QID # 28, until gone"    LVM requesting callback.

## 2012-04-06 NOTE — ED Notes (Signed)
Pt returned call.  Informed of dx and need for addl tx.  Rx for Keflex called and left on VM @ [facility] [phone].

## 2012-07-04 ENCOUNTER — Encounter (HOSPITAL_BASED_OUTPATIENT_CLINIC_OR_DEPARTMENT_OTHER): Payer: Self-pay

## 2012-07-04 ENCOUNTER — Emergency Department (HOSPITAL_BASED_OUTPATIENT_CLINIC_OR_DEPARTMENT_OTHER)
Admission: EM | Admit: 2012-07-04 | Discharge: 2012-07-05 | Disposition: A | Discharge status: Home / Self Care | Payer: 59 | Attending: Emergency Medicine | Admitting: Emergency Medicine

## 2012-07-04 DIAGNOSIS — R112 Nausea with vomiting, unspecified: Secondary | ICD-10-CM | POA: Insufficient documentation

## 2012-07-04 DIAGNOSIS — Z794 Long term (current) use of insulin: Secondary | ICD-10-CM | POA: Insufficient documentation

## 2012-07-04 DIAGNOSIS — I2510 Atherosclerotic heart disease of native coronary artery without angina pectoris: Secondary | ICD-10-CM | POA: Insufficient documentation

## 2012-07-04 DIAGNOSIS — K219 Gastro-esophageal reflux disease without esophagitis: Secondary | ICD-10-CM | POA: Insufficient documentation

## 2012-07-04 DIAGNOSIS — R197 Diarrhea, unspecified: Secondary | ICD-10-CM | POA: Insufficient documentation

## 2012-07-04 DIAGNOSIS — E119 Type 2 diabetes mellitus without complications: Secondary | ICD-10-CM | POA: Insufficient documentation

## 2012-07-04 DIAGNOSIS — F329 Major depressive disorder, single episode, unspecified: Secondary | ICD-10-CM | POA: Insufficient documentation

## 2012-07-04 DIAGNOSIS — Z91040 Latex allergy status: Secondary | ICD-10-CM | POA: Insufficient documentation

## 2012-07-04 DIAGNOSIS — Z7982 Long term (current) use of aspirin: Secondary | ICD-10-CM | POA: Insufficient documentation

## 2012-07-04 DIAGNOSIS — F3289 Other specified depressive episodes: Secondary | ICD-10-CM | POA: Insufficient documentation

## 2012-07-04 DIAGNOSIS — G589 Mononeuropathy, unspecified: Secondary | ICD-10-CM | POA: Insufficient documentation

## 2012-07-04 DIAGNOSIS — Z79899 Other long term (current) drug therapy: Secondary | ICD-10-CM | POA: Insufficient documentation

## 2012-07-04 DIAGNOSIS — IMO0002 Reserved for concepts with insufficient information to code with codable children: Secondary | ICD-10-CM | POA: Insufficient documentation

## 2012-07-04 MED ORDER — SODIUM CHLORIDE 0.9 % IV BOLUS (SEPSIS)
1000.0000 mL | Freq: Once | INTRAVENOUS | Status: AC
  Administered 2012-07-04: 1000 mL via INTRAVENOUS

## 2012-07-04 MED ORDER — ONDANSETRON HCL 4 MG/2ML IJ SOLN
4.0000 mg | Freq: Once | INTRAMUSCULAR | Status: AC
  Administered 2012-07-04: 4 mg via INTRAVENOUS
  Filled 2012-07-04: qty 2

## 2012-07-04 MED ORDER — FENTANYL CITRATE 0.05 MG/ML IJ SOLN
INTRAMUSCULAR | Status: AC
  Filled 2012-07-04: qty 2

## 2012-07-04 MED ORDER — FENTANYL CITRATE 0.05 MG/ML IJ SOLN
100.0000 ug | Freq: Once | INTRAMUSCULAR | Status: AC
  Administered 2012-07-04: 100 ug via INTRAVENOUS

## 2012-07-04 NOTE — ED Provider Notes (Signed)
History  This chart was scribed for Riad Wagley Smitty Cords, MD by Greggory Stallion, ED Scribe. This patient was seen in room MH01/MH01 and the patient's care was started at 11:04 PM.  CSN: [id number]  Arrival date & time 07/04/12  2209     Chief Complaint  Patient presents with  . Abdominal Pain    Patient is a 47 y.o. female presenting with abdominal pain. The history is provided by the patient. No language interpreter was used.  Abdominal Pain This is a new problem. The current episode started 6 to 12 hours ago. The problem occurs constantly. The problem has not changed since onset.Associated symptoms include abdominal pain (lower). Pertinent negatives include no chest pain, no headaches and no shortness of breath. Nothing aggravates the symptoms. Nothing relieves the symptoms. She has tried nothing for the symptoms. The treatment provided no relief.    [name] is a 47 y.o. female who presents to the Emergency Department complaining of lower abdominal pain that started earlier today. Pt states she has had multiple episodes of emesis and diarrhea. Pt denies hematochezia and hematemesis. Pt denies fever, neck pain, sore throat, visual disturbance, CP, cough, SOB, abdominal pain, urinary symptoms, back pain, HA, weakness, numbness and rash as associated symptoms..   Past Medical History  Diagnosis Date  . Diabetes mellitus   . Depression   . Neuropathy   . GERD (gastroesophageal reflux disease)   . Coronary artery disease     30% lesions noted    Past Surgical History  Procedure Laterality Date  . Abdominal hysterectomy      partial  . Cholecystectomy      No family history on file.  History  Substance Use Topics  . Smoking status: Never Smoker   . Smokeless tobacco: Not on file  . Alcohol Use: Yes    OB History   Grav Para Term Preterm Abortions TAB SAB Ect Mult Living                  Review of Systems  Constitutional: Negative for fever.  HENT: Negative  for sore throat and neck pain.   Eyes: Negative for visual disturbance.  Respiratory: Negative for cough and shortness of breath.   Cardiovascular: Negative for chest pain.  Gastrointestinal: Positive for nausea, vomiting, abdominal pain (lower) and diarrhea.  Genitourinary: Negative for dysuria and difficulty urinating.  Musculoskeletal: Negative for back pain.  Skin: Negative for rash.  Neurological: Negative for weakness, numbness and headaches.  All other systems reviewed and are negative.    Allergies  Latex and Morphine  Home Medications   Current Outpatient Rx  Name  Route  Sig  Dispense  Refill  . aspirin 81 MG tablet   Oral   Take 81 mg by mouth daily.         . carvedilol (COREG) 12.5 MG tablet   Oral   Take 12.5 mg by mouth 2 (two) times daily with a meal.         . citalopram (CELEXA) 10 MG tablet   Oral   Take 10 mg by mouth daily.         Marland Kitchen EXPIRED: fluticasone (FLONASE) 50 MCG/ACT nasal spray   Nasal   Place 2 sprays into the nose daily.   16 g   0   . gabapentin (NEURONTIN) 300 MG capsule   Oral   Take 300 mg by mouth 3 (three) times daily.         Marland Kitchen  insulin aspart protamine-insulin aspart (NOVOLOG 70/30) (70-30) 100 UNIT/ML injection   Subcutaneous   Inject into the skin.         . naproxen (NAPROSYN) 500 MG tablet   Oral   Take 1 tablet (500 mg total) by mouth 2 (two) times daily.   30 tablet   0   . pantoprazole (PROTONIX) 40 MG tablet   Oral   Take 40 mg by mouth daily.         . pseudoephedrine (SUDAFED 12 HOUR) 120 MG 12 hr tablet   Oral   Take 1 tablet (120 mg total) by mouth every 12 (twelve) hours.   10 tablet   0   . traMADol (ULTRAM) 50 MG tablet   Oral   Take 1 tablet (50 mg total) by mouth every 6 (six) hours as needed for pain.   20 tablet   0   . triamterene-hydrochlorothiazide (DYAZIDE) 37.5-25 MG per capsule   Oral   Take 1 capsule by mouth every morning.           BP 137/87  Pulse 68  Temp(Src)  98.3 F (36.8 C) (Oral)  Resp 26  SpO2 100%  Physical Exam  Constitutional: She is oriented to person, place, and time. She appears well-developed and well-nourished.  HENT:  Head: Normocephalic and atraumatic.  Mouth/Throat: Oropharynx is clear and moist.  Eyes: Conjunctivae are normal. Pupils are equal, round, and reactive to light.  Neck: Normal range of motion.  Cardiovascular: Normal rate and regular rhythm.   Pulmonary/Chest: Effort normal and breath sounds normal. She has no wheezes.  Abdominal: Soft. She exhibits no distension. There is no tenderness. There is no rebound and no guarding.  Hyperactive bowel sounds throughout, more so on the left than the right.  Musculoskeletal: Normal range of motion.  Intact distal reflexes.   Neurological: She is alert and oriented to person, place, and time. She has normal reflexes.  Skin: Skin is warm and dry.  Psychiatric: She has a normal mood and affect.    ED Course  Procedures (including critical care time)  DIAGNOSTIC STUDIES: Oxygen Saturation is 100% on RA, normal by my interpretation.    COORDINATION OF CARE: 11:08 PM-Discussed treatment planwith pt at bedside and pt agreed to plan.   Labs Reviewed  COMPREHENSIVE METABOLIC PANEL - Abnormal; Notable for the following:    Glucose, Bld 287 (*)    Creatinine, Ser 1.20 (*)    GFR calc non Af Amer 53 (*)    GFR calc Af Amer 61 (*)    All other components within normal limits  CBC WITH DIFFERENTIAL  LIPASE, BLOOD  URINALYSIS, ROUTINE W REFLEX MICROSCOPIC   No results found.   No diagnosis found.    MDM  Symptoms cw viral n/v/d  Will prescribe pain medication and antiemetics      I personally performed the services described in this documentation, which was scribed in my presence. The recorded information has been reviewed and is accurate.    Jasmine Awe, MD 07/05/12 [phone]

## 2012-07-04 NOTE — ED Notes (Signed)
Patient reports that she developed acute abdominal pain this afternoon with multiple episodes of vomiting and diarrhea.

## 2012-07-05 ENCOUNTER — Emergency Department (HOSPITAL_BASED_OUTPATIENT_CLINIC_OR_DEPARTMENT_OTHER): Payer: 59

## 2012-07-05 LAB — COMPREHENSIVE METABOLIC PANEL
AST: 20 U/L (ref 0–37)
BUN: 21 mg/dL (ref 6–23)
CO2: 22 mEq/L (ref 19–32)
Calcium: 9.7 mg/dL (ref 8.4–10.5)
Creatinine, Ser: 1.2 mg/dL — ABNORMAL HIGH (ref 0.50–1.10)
GFR calc Af Amer: 61 mL/min — ABNORMAL LOW (ref >90)
GFR calc non Af Amer: 53 mL/min — ABNORMAL LOW (ref >90)

## 2012-07-05 LAB — URINALYSIS, ROUTINE W REFLEX MICROSCOPIC
Leukocytes, UA: NEGATIVE
Nitrite: NEGATIVE
Protein, ur: NEGATIVE mg/dL
Urobilinogen, UA: 0.2 mg/dL (ref 0.0–1.0)

## 2012-07-05 LAB — CBC WITH DIFFERENTIAL/PLATELET
Basophils Absolute: 0 10*3/uL (ref 0.0–0.1)
Eosinophils Relative: 0 % (ref 0–5)
HCT: 38.7 % (ref 36.0–46.0)
Lymphocytes Relative: 22 % (ref 12–46)
MCHC: 35.1 g/dL (ref 30.0–36.0)
MCV: 86.2 fL (ref 78.0–100.0)
Monocytes Absolute: 0.2 10*3/uL (ref 0.1–1.0)
Monocytes Relative: 3 % (ref 3–12)
RDW: 12.3 % (ref 11.5–15.5)
WBC: 7.2 10*3/uL (ref 4.0–10.5)

## 2012-07-05 LAB — LIPASE, BLOOD: Lipase: 35 U/L (ref 11–59)

## 2012-07-05 MED ORDER — FENTANYL CITRATE 0.05 MG/ML IJ SOLN
100.0000 ug | Freq: Once | INTRAMUSCULAR | Status: AC
  Administered 2012-07-05: 100 ug via INTRAVENOUS
  Filled 2012-07-05: qty 2

## 2012-07-05 MED ORDER — TRAMADOL HCL 50 MG PO TABS: 50.0000 mg | ORAL_TABLET | Freq: Once | ORAL | Status: DC

## 2012-07-05 MED ORDER — ONDANSETRON 8 MG PO TBDP: ORAL_TABLET | ORAL | Status: DC

## 2012-07-05 MED ORDER — TRAMADOL HCL 50 MG PO TABS: 50.0000 mg | ORAL_TABLET | Freq: Four times a day (QID) | ORAL | Status: DC | PRN

## 2012-07-05 MED ORDER — ONDANSETRON HCL 4 MG/2ML IJ SOLN
4.0000 mg | Freq: Once | INTRAMUSCULAR | Status: AC
  Administered 2012-07-05: 4 mg via INTRAVENOUS
  Filled 2012-07-05: qty 2

## 2012-07-05 MED ORDER — KETOROLAC TROMETHAMINE 30 MG/ML IJ SOLN
30.0000 mg | Freq: Once | INTRAMUSCULAR | Status: AC
  Administered 2012-07-05: 30 mg via INTRAVENOUS
  Filled 2012-07-05: qty 1

## 2012-09-13 DIAGNOSIS — G43909 Migraine, unspecified, not intractable, without status migrainosus: Secondary | ICD-10-CM | POA: Insufficient documentation

## 2012-09-13 DIAGNOSIS — I679 Cerebrovascular disease, unspecified: Secondary | ICD-10-CM

## 2012-09-13 DIAGNOSIS — R930 Abnormal findings on diagnostic imaging of skull and head, not elsewhere classified: Secondary | ICD-10-CM

## 2012-09-13 HISTORY — DX: Cerebrovascular disease, unspecified: I67.9

## 2012-09-13 HISTORY — DX: Abnormal findings on diagnostic imaging of skull and head, not elsewhere classified: R93.0

## 2012-09-13 HISTORY — DX: Migraine, unspecified, not intractable, without status migrainosus: G43.909

## 2013-04-01 ENCOUNTER — Encounter (HOSPITAL_BASED_OUTPATIENT_CLINIC_OR_DEPARTMENT_OTHER): Payer: Self-pay | Admitting: Emergency Medicine

## 2013-04-01 ENCOUNTER — Emergency Department (HOSPITAL_BASED_OUTPATIENT_CLINIC_OR_DEPARTMENT_OTHER)
Admission: EM | Admit: 2013-04-01 | Discharge: 2013-04-01 | Disposition: A | Discharge status: Home / Self Care | Payer: 59 | Attending: Emergency Medicine | Admitting: Emergency Medicine

## 2013-04-01 DIAGNOSIS — R42 Dizziness and giddiness: Secondary | ICD-10-CM | POA: Insufficient documentation

## 2013-04-01 DIAGNOSIS — F329 Major depressive disorder, single episode, unspecified: Secondary | ICD-10-CM | POA: Insufficient documentation

## 2013-04-01 DIAGNOSIS — Z91040 Latex allergy status: Secondary | ICD-10-CM | POA: Insufficient documentation

## 2013-04-01 DIAGNOSIS — Z79899 Other long term (current) drug therapy: Secondary | ICD-10-CM | POA: Insufficient documentation

## 2013-04-01 DIAGNOSIS — Z87440 Personal history of urinary (tract) infections: Secondary | ICD-10-CM | POA: Insufficient documentation

## 2013-04-01 DIAGNOSIS — R209 Unspecified disturbances of skin sensation: Secondary | ICD-10-CM | POA: Insufficient documentation

## 2013-04-01 DIAGNOSIS — I2510 Atherosclerotic heart disease of native coronary artery without angina pectoris: Secondary | ICD-10-CM | POA: Insufficient documentation

## 2013-04-01 DIAGNOSIS — G909 Disorder of the autonomic nervous system, unspecified: Secondary | ICD-10-CM | POA: Insufficient documentation

## 2013-04-01 DIAGNOSIS — Z791 Long term (current) use of non-steroidal anti-inflammatories (NSAID): Secondary | ICD-10-CM | POA: Insufficient documentation

## 2013-04-01 DIAGNOSIS — F3289 Other specified depressive episodes: Secondary | ICD-10-CM | POA: Insufficient documentation

## 2013-04-01 DIAGNOSIS — K219 Gastro-esophageal reflux disease without esophagitis: Secondary | ICD-10-CM | POA: Insufficient documentation

## 2013-04-01 DIAGNOSIS — IMO0002 Reserved for concepts with insufficient information to code with codable children: Secondary | ICD-10-CM | POA: Insufficient documentation

## 2013-04-01 DIAGNOSIS — R110 Nausea: Secondary | ICD-10-CM | POA: Insufficient documentation

## 2013-04-01 DIAGNOSIS — E1149 Type 2 diabetes mellitus with other diabetic neurological complication: Secondary | ICD-10-CM | POA: Insufficient documentation

## 2013-04-01 DIAGNOSIS — M533 Sacrococcygeal disorders, not elsewhere classified: Secondary | ICD-10-CM | POA: Insufficient documentation

## 2013-04-01 DIAGNOSIS — Z7982 Long term (current) use of aspirin: Secondary | ICD-10-CM | POA: Insufficient documentation

## 2013-04-01 LAB — URINALYSIS, ROUTINE W REFLEX MICROSCOPIC
Bilirubin Urine: NEGATIVE
GLUCOSE, UA: NEGATIVE mg/dL
HGB URINE DIPSTICK: NEGATIVE
Ketones, ur: NEGATIVE mg/dL
Leukocytes, UA: NEGATIVE
Nitrite: NEGATIVE
PH: 5.5 (ref 5.0–8.0)
Protein, ur: NEGATIVE mg/dL
SPECIFIC GRAVITY, URINE: 1.014 (ref 1.005–1.030)
UROBILINOGEN UA: 0.2 mg/dL (ref 0.0–1.0)

## 2013-04-01 MED ORDER — ONDANSETRON 8 MG PO TBDP
8.0000 mg | ORAL_TABLET | Freq: Once | ORAL | Status: AC
  Administered 2013-04-01: 8 mg via ORAL
  Filled 2013-04-01: qty 1

## 2013-04-01 MED ORDER — ONDANSETRON 8 MG PO TBDP: 8.0000 mg | ORAL_TABLET | Freq: Three times a day (TID) | ORAL | Status: DC | PRN

## 2013-04-01 MED ORDER — HYDROCODONE-ACETAMINOPHEN 5-325 MG PO TABS: 1.0000 | ORAL_TABLET | Freq: Four times a day (QID) | ORAL | Status: DC | PRN

## 2013-04-01 NOTE — ED Notes (Signed)
Pt c/o left lower back pain and has difficulty sleeping due to it; was told by PMD 8 days ago she is in kidney failure; went for imaging 3 days ago; has not had results back yet; was told to take tylenol is not helping pain; also was placed on lisinopril and sts it makes her feel weak, so pmd told her to stop taking it.

## 2013-04-01 NOTE — ED Provider Notes (Addendum)
CSN: [id number]     Arrival date & time 04/01/13  1308 History   None    Chief Complaint  Patient presents with  . Back Pain     (Consider location/radiation/quality/duration/timing/severity/associated sxs/prior Treatment) HPI This is a 48 year old female who was recently diagnosed with renal insufficiency and has had a renal ultrasound the results of which are pending. She was recently placed on lisinopril but this made her lightheaded and her physician took her off of it. She was recently placed on ciprofloxacin for urinary tract infection.  She is here this morning with a three-day history of low back pain. The pain is located in the left sacroiliac region radiating around to the left groin. She denies injury. The pain came on gradually. It is now moderate to severe, worse with flexion of her back. She is having numbness and paresthesias of her leg but states that this is been ongoing and due to diabetic neuropathy. She has no new weakness of the legs and is able to ambulate. The pain is causing her to have difficulty finding a comfortable position and she is having difficulty sleeping as a result. She has been nauseated for 2 days but without vomiting or diarrhea. She denies fever or chills.  Past Medical History  Diagnosis Date  . Diabetes mellitus   . Depression   . Neuropathy   . GERD (gastroesophageal reflux disease)   . Coronary artery disease     30% lesions noted   Past Surgical History  Procedure Laterality Date  . Abdominal hysterectomy      partial  . Cholecystectomy     History reviewed. No pertinent family history. History  Substance Use Topics  . Smoking status: Never Smoker   . Smokeless tobacco: Not on file  . Alcohol Use: No   OB History   Grav Para Term Preterm Abortions TAB SAB Ect Mult Living                 Review of Systems  All other systems reviewed and are negative.   Allergies  Latex and Morphine  Home Medications   Current Outpatient Rx   Name  Route  Sig  Dispense  Refill  . aspirin 81 MG tablet   Oral   Take 81 mg by mouth daily.         . carvedilol (COREG) 12.5 MG tablet   Oral   Take 12.5 mg by mouth 2 (two) times daily with a meal.         . citalopram (CELEXA) 10 MG tablet   Oral   Take 10 mg by mouth daily.         Marland Kitchen EXPIRED: fluticasone (FLONASE) 50 MCG/ACT nasal spray   Nasal   Place 2 sprays into the nose daily.   16 g   0   . gabapentin (NEURONTIN) 300 MG capsule   Oral   Take 300 mg by mouth 3 (three) times daily.         . naproxen (NAPROSYN) 500 MG tablet   Oral   Take 1 tablet (500 mg total) by mouth 2 (two) times daily.   30 tablet   0   . ondansetron (ZOFRAN ODT) 8 MG disintegrating tablet      8mg  ODT q8 hours prn nausea   4 tablet   0   . pantoprazole (PROTONIX) 40 MG tablet   Oral   Take 40 mg by mouth daily.         Marland Kitchen  traMADol (ULTRAM) 50 MG tablet   Oral   Take 1 tablet (50 mg total) by mouth every 6 (six) hours as needed for pain.   20 tablet   0   . traMADol (ULTRAM) 50 MG tablet   Oral   Take 1 tablet (50 mg total) by mouth every 6 (six) hours as needed for pain.   15 tablet   0   . triamterene-hydrochlorothiazide (DYAZIDE) 37.5-25 MG per capsule   Oral   Take 1 capsule by mouth every morning.          BP 130/90  Pulse 84  Temp(Src) 98.2 F (36.8 C) (Oral)  Resp 18  Ht 6' (1.829 m)  Wt 207 lb (93.895 kg)  BMI 28.07 kg/m2  SpO2 100%  Physical Exam General: Well-developed, well-nourished female in no acute distress; appearance consistent with age of record HENT: normocephalic; atraumatic Eyes: pupils equal, round and reactive to light; extraocular muscles intact Neck: supple Heart: regular rate and rhythm Lungs: clear to auscultation bilaterally Abdomen: soft; nondistended; nontender; bowel sounds present Back: Left sacroiliac tenderness with pain on flexion of lower back; no pain on straight leg raise bilaterally GU: No CVA  tenderness Extremities: No deformity; full range of motion; pulses normal Neurologic: Awake, alert and oriented; motor function intact in all extremities and symmetric; no facial droop Skin: Warm and dry Psychiatric: Flat affect    ED Course  Procedures (including critical care time)  MDM   Nursing notes and vitals signs, including pulse oximetry, reviewed.  Summary of this visit's results, reviewed by myself:  Labs:  Results for orders placed during the hospital encounter of 04/01/13 (from the past 24 hour(s))  URINALYSIS, ROUTINE W REFLEX MICROSCOPIC     Status: None   Collection Time    04/01/13  5:50 AM      Result Value Ref Range   Color, Urine YELLOW  YELLOW   APPearance CLEAR  CLEAR   Specific Gravity, Urine 1.014  1.005 - 1.030   pH 5.5  5.0 - 8.0   Glucose, UA NEGATIVE  NEGATIVE mg/dL   Hgb urine dipstick NEGATIVE  NEGATIVE   Bilirubin Urine NEGATIVE  NEGATIVE   Ketones, ur NEGATIVE  NEGATIVE mg/dL   Protein, ur NEGATIVE  NEGATIVE mg/dL   Urobilinogen, UA 0.2  0.0 - 1.0 mg/dL   Nitrite NEGATIVE  NEGATIVE   Leukocytes, UA NEGATIVE  NEGATIVE   The location and nature the patient's pain is not consistent with a kidney stone. We'll treat her pain and refer her back to her primary care physician. Given her history of renal insufficiency she was advised to avoid NSAIDs such as ibuprofen or naproxen until she can discuss this with her PCP.     Wynetta Fines, MD 04/01/13 0177  Wynetta Fines, MD 04/01/13 [phone]

## 2013-04-25 DIAGNOSIS — E559 Vitamin D deficiency, unspecified: Secondary | ICD-10-CM

## 2013-04-25 HISTORY — DX: Vitamin D deficiency, unspecified: E55.9

## 2013-11-14 ENCOUNTER — Emergency Department (HOSPITAL_BASED_OUTPATIENT_CLINIC_OR_DEPARTMENT_OTHER): Payer: 59

## 2013-11-14 ENCOUNTER — Emergency Department (HOSPITAL_BASED_OUTPATIENT_CLINIC_OR_DEPARTMENT_OTHER)
Admission: EM | Admit: 2013-11-14 | Discharge: 2013-11-14 | Disposition: A | Discharge status: Home / Self Care | Payer: 59 | Attending: Emergency Medicine | Admitting: Emergency Medicine

## 2013-11-14 ENCOUNTER — Encounter (HOSPITAL_BASED_OUTPATIENT_CLINIC_OR_DEPARTMENT_OTHER): Payer: Self-pay | Admitting: Emergency Medicine

## 2013-11-14 DIAGNOSIS — Z7951 Long term (current) use of inhaled steroids: Secondary | ICD-10-CM | POA: Insufficient documentation

## 2013-11-14 DIAGNOSIS — M545 Low back pain: Secondary | ICD-10-CM | POA: Diagnosis present

## 2013-11-14 DIAGNOSIS — E119 Type 2 diabetes mellitus without complications: Secondary | ICD-10-CM | POA: Insufficient documentation

## 2013-11-14 DIAGNOSIS — Z8541 Personal history of malignant neoplasm of cervix uteri: Secondary | ICD-10-CM | POA: Diagnosis not present

## 2013-11-14 DIAGNOSIS — F329 Major depressive disorder, single episode, unspecified: Secondary | ICD-10-CM | POA: Diagnosis not present

## 2013-11-14 DIAGNOSIS — M5431 Sciatica, right side: Secondary | ICD-10-CM | POA: Diagnosis not present

## 2013-11-14 DIAGNOSIS — Z792 Long term (current) use of antibiotics: Secondary | ICD-10-CM | POA: Insufficient documentation

## 2013-11-14 DIAGNOSIS — G629 Polyneuropathy, unspecified: Secondary | ICD-10-CM | POA: Diagnosis not present

## 2013-11-14 DIAGNOSIS — Z79899 Other long term (current) drug therapy: Secondary | ICD-10-CM | POA: Insufficient documentation

## 2013-11-14 DIAGNOSIS — Z91040 Latex allergy status: Secondary | ICD-10-CM | POA: Insufficient documentation

## 2013-11-14 DIAGNOSIS — I2510 Atherosclerotic heart disease of native coronary artery without angina pectoris: Secondary | ICD-10-CM | POA: Insufficient documentation

## 2013-11-14 DIAGNOSIS — K219 Gastro-esophageal reflux disease without esophagitis: Secondary | ICD-10-CM | POA: Diagnosis not present

## 2013-11-14 DIAGNOSIS — M5441 Lumbago with sciatica, right side: Secondary | ICD-10-CM

## 2013-11-14 DIAGNOSIS — Z7982 Long term (current) use of aspirin: Secondary | ICD-10-CM | POA: Diagnosis not present

## 2013-11-14 DIAGNOSIS — Z3202 Encounter for pregnancy test, result negative: Secondary | ICD-10-CM | POA: Insufficient documentation

## 2013-11-14 HISTORY — DX: Malignant neoplasm of cervix uteri, unspecified: C53.9

## 2013-11-14 LAB — URINE MICROSCOPIC-ADD ON

## 2013-11-14 LAB — URINALYSIS, ROUTINE W REFLEX MICROSCOPIC
Bilirubin Urine: NEGATIVE
Hgb urine dipstick: NEGATIVE
Ketones, ur: NEGATIVE mg/dL
LEUKOCYTES UA: NEGATIVE
Nitrite: NEGATIVE
PH: 6.5 (ref 5.0–8.0)
PROTEIN: NEGATIVE mg/dL
SPECIFIC GRAVITY, URINE: 1.022 (ref 1.005–1.030)
Urobilinogen, UA: 1 mg/dL (ref 0.0–1.0)

## 2013-11-14 LAB — PREGNANCY, URINE: PREG TEST UR: NEGATIVE

## 2013-11-14 MED ORDER — PREDNISONE 20 MG PO TABS
20.0000 mg | ORAL_TABLET | Freq: Once | ORAL | Status: AC
  Administered 2013-11-14: 20 mg via ORAL
  Filled 2013-11-14: qty 1

## 2013-11-14 MED ORDER — PREDNISONE 10 MG PO TABS: 20.0000 mg | ORAL_TABLET | Freq: Every day | ORAL | Status: DC

## 2013-11-14 MED ORDER — HYDROMORPHONE HCL 1 MG/ML IJ SOLN
2.0000 mg | Freq: Once | INTRAMUSCULAR | Status: AC
  Administered 2013-11-14: 2 mg via INTRAMUSCULAR
  Filled 2013-11-14 (×2): qty 2

## 2013-11-14 NOTE — Discharge Instructions (Signed)
Sciatica °Sciatica is pain, weakness, numbness, or tingling along your sciatic nerve. The nerve starts in the lower back and runs down the back of each leg. Nerve damage or certain conditions pinch or put pressure on the sciatic nerve. This causes the pain, weakness, and other discomforts of sciatica. °HOME CARE  °· Only take medicine as told by your doctor. °· Apply ice to the affected area for 20 minutes. Do this 3-4 times a day for the first 48-72 hours. Then try heat in the same way. °· Exercise, stretch, or do your usual activities if these do not make your pain worse. °· Go to physical therapy as told by your doctor. °· Keep all doctor visits as told. °· Do not wear high heels or shoes that are not supportive. °· Get a firm mattress if your mattress is too soft to lessen pain and discomfort. °GET HELP RIGHT AWAY IF:  °· You cannot control when you poop (bowel movement) or pee (urinate). °· You have more weakness in your lower back, lower belly (pelvis), butt (buttocks), or legs. °· You have redness or puffiness (swelling) of your back. °· You have a burning feeling when you pee. °· You have pain that gets worse when you lie down. °· You have pain that wakes you from your sleep. °· Your pain is worse than past pain. °· Your pain lasts longer than 4 weeks. °· You are suddenly losing weight without reason. °MAKE SURE YOU:  °· Understand these instructions. °· Will watch this condition. °· Will get help right away if you are not doing well or get worse. °Document Released: 11/06/2007 Document Revised: 07/29/2011 Document Reviewed: 06/08/2011 °[employer]® Patient Information ©2015 [employer]. This information is not intended to replace advice given to you by your health care provider. Make sure you discuss any questions you have with your health care provider. ° °

## 2013-11-14 NOTE — ED Notes (Signed)
Pt states that she has called her sister who is on the way to pick her up. Pt verbalizes understanding that she cannot drive after receiving dilaudid injection.

## 2013-11-14 NOTE — ED Provider Notes (Signed)
CSN: [id number]     Arrival date & time 11/14/13  4315 History   First MD Initiated Contact with Patient 11/14/13 [phone]     Chief Complaint  Patient presents with  . Back Pain     (Consider location/radiation/quality/duration/timing/severity/associated sxs/prior Treatment) Patient is a 48 y.o. female presenting with back pain. The history is provided by the patient.  Back Pain Location:  Lumbar spine Quality:  Stabbing Radiates to: To right buttock. Pain severity:  Severe Pain is:  Same all the time Onset quality:  Gradual Duration:  4 days Timing:  Constant Progression:  Waxing and waning Chronicity:  New Context: not emotional stress, not falling, not jumping from heights, not lifting heavy objects, not MCA, not MVA, not occupational injury, not pedestrian accident, not physical stress, not recent illness, not recent injury and not twisting   Relieved by:  Narcotics and muscle relaxants (Patient seen at [facility] office Friday and rx given for vicodin and flexeril.  patient has taken with some intermittent relief but pain now severe again. ) Worsened by:  Movement Ineffective treatments:  Narcotics and muscle relaxants Associated symptoms: no abdominal pain, no abdominal swelling, no bladder incontinence, no bowel incontinence, no chest pain, no dysuria, no fever, no headaches, no leg pain, no numbness, no paresthesias, no pelvic pain, no perianal numbness, no tingling, no weakness and no weight loss   Risk factors: hx of cancer, lack of exercise and menopause   Risk factors: no hx of osteoporosis, not obese, not pregnant, no recent surgery, no steroid use and no vascular disease     Past Medical History  Diagnosis Date  . Diabetes mellitus   . Depression   . Neuropathy   . GERD (gastroesophageal reflux disease)   . Coronary artery disease     30% lesions noted  . Cervical cancer    Past Surgical History  Procedure Laterality Date  . Abdominal hysterectomy      partial  .  Cholecystectomy     History reviewed. No pertinent family history. History  Substance Use Topics  . Smoking status: Never Smoker   . Smokeless tobacco: Not on file  . Alcohol Use: No   OB History   Grav Para Term Preterm Abortions TAB SAB Ect Mult Living                 Review of Systems  Constitutional: Negative for fever and weight loss.  Cardiovascular: Negative for chest pain.  Gastrointestinal: Negative for abdominal pain and bowel incontinence.  Genitourinary: Negative for bladder incontinence, dysuria and pelvic pain.  Musculoskeletal: Positive for back pain.  Neurological: Negative for tingling, weakness, numbness, headaches and paresthesias.  All other systems reviewed and are negative.     Allergies  Latex and Morphine  Home Medications   Prior to Admission medications   Medication Sig Start Date End Date Taking? Authorizing Provider  aspirin 81 MG tablet Take 81 mg by mouth daily.    Historical Provider, MD  atorvastatin (LIPITOR) 80 MG tablet Take 80 mg by mouth daily.    Historical Provider, MD  carvedilol (COREG) 12.5 MG tablet Take 12.5 mg by mouth 2 (two) times daily with a meal.    Historical Provider, MD  ciprofloxacin (CIPRO) 250 MG tablet Take 250 mg by mouth 2 (two) times daily.    Historical Provider, MD  citalopram (CELEXA) 10 MG tablet Take 10 mg by mouth daily.    Historical Provider, MD  fluticasone (FLONASE) 50 MCG/ACT nasal spray Place  2 sprays into the nose daily. 03/30/12 04/03/12  Carmin Muskrat, MD  gabapentin (NEURONTIN) 300 MG capsule Take 300 mg by mouth 3 (three) times daily.    Historical Provider, MD  HYDROcodone-acetaminophen (NORCO/VICODIN) 5-325 MG per tablet Take 1-2 tablets by mouth every 6 (six) hours as needed for moderate pain. 04/01/13   Karen Chafe Molpus, MD  metoCLOPramide (REGLAN) 5 MG tablet Take 5 mg by mouth 4 (four) times daily.    Historical Provider, MD  ondansetron (ZOFRAN ODT) 8 MG disintegrating tablet 8mg  ODT q8 hours prn  nausea 07/05/12   April K Palumbo-Rasch, MD  ondansetron (ZOFRAN ODT) 8 MG disintegrating tablet Take 1 tablet (8 mg total) by mouth every 8 (eight) hours as needed. 04/01/13   John L Molpus, MD  pantoprazole (PROTONIX) 40 MG tablet Take 40 mg by mouth daily.    Historical Provider, MD  promethazine (PHENERGAN) 25 MG tablet Take 25 mg by mouth every 6 (six) hours as needed for nausea or vomiting.    Historical Provider, MD  topiramate (TOPAMAX) 50 MG tablet Take 50 mg by mouth 2 (two) times daily.    Historical Provider, MD  traMADol (ULTRAM) 50 MG tablet Take 1 tablet (50 mg total) by mouth every 6 (six) hours as needed for pain. 11/13/11   Dorie Rank, MD  traMADol (ULTRAM) 50 MG tablet Take 1 tablet (50 mg total) by mouth every 6 (six) hours as needed for pain. 07/05/12   April K Palumbo-Rasch, MD  triamterene-hydrochlorothiazide (DYAZIDE) 37.5-25 MG per capsule Take 1 capsule by mouth every morning.    Historical Provider, MD   BP 142/101  Pulse 84  Temp(Src) 98.7 F (37.1 C) (Oral)  Resp 16  Ht 6' (1.829 m)  Wt 194 lb (87.998 kg)  BMI 26.31 kg/m2  SpO2 100% Physical Exam  Nursing note and vitals reviewed. Constitutional: She is oriented to person, place, and time. She appears well-developed and well-nourished.  HENT:  Head: Normocephalic and atraumatic.  Right Ear: Tympanic membrane and external ear normal.  Left Ear: Tympanic membrane and external ear normal.  Nose: Nose normal. Right sinus exhibits no maxillary sinus tenderness and no frontal sinus tenderness. Left sinus exhibits no maxillary sinus tenderness and no frontal sinus tenderness.  Eyes: Conjunctivae and EOM are normal. Pupils are equal, round, and reactive to light. Right eye exhibits no nystagmus. Left eye exhibits no nystagmus.  Neck: Normal range of motion. Neck supple.  Cardiovascular: Normal rate, regular rhythm, normal heart sounds and intact distal pulses.   Pulmonary/Chest: Effort normal and breath sounds normal. No  respiratory distress. She exhibits no tenderness.  Abdominal: Soft. Bowel sounds are normal. She exhibits no distension and no mass. There is no tenderness.  Musculoskeletal: Normal range of motion. She exhibits no edema and no tenderness.  Neurological: She is alert and oriented to person, place, and time. She has normal strength and normal reflexes. No sensory deficit. She displays a negative Romberg sign. GCS eye subscore is 4. GCS verbal subscore is 5. GCS motor subscore is 6.  Reflex Scores:      Tricep reflexes are 2+ on the right side and 2+ on the left side.      Bicep reflexes are 2+ on the right side and 2+ on the left side.      Brachioradialis reflexes are 2+ on the right side and 2+ on the left side.      Patellar reflexes are 2+ on the right side and 2+ on the  left side.      Achilles reflexes are 2+ on the right side and 2+ on the left side. Patient with normal gait without ataxia, shuffling, spasm, or antalgia. Speech is normal without dysarthria, dysphasia, or aphasia. Muscle strength is 5/5 in bilateral shoulders, elbow flexor and extensors, wrist flexor and extensors, and intrinsic hand muscles. 5/5 bilateral lower extremity hip flexors, extensors, knee flexors and extensors, and ankle dorsi and plantar flexors.    Skin: Skin is warm and dry. No rash noted.  Psychiatric: She has a normal mood and affect. Her behavior is normal. Judgment and thought content normal.    ED Course  Procedures (including critical care time) Labs Review Labs Reviewed  URINALYSIS, ROUTINE W REFLEX MICROSCOPIC - Abnormal; Notable for the following:    Glucose, UA >1000 (*)    All other components within normal limits  PREGNANCY, URINE  URINE MICROSCOPIC-ADD ON    Imaging Review Dg Lumbar Spine Complete  11/14/2013   CLINICAL DATA:  Low back pain with right leg pain for 4 days. No injury.  EXAM: LUMBAR SPINE - COMPLETE 4+ VIEW  COMPARISON:  None.  FINDINGS: Alignment is anatomic. Vertebral  body and disc space height are maintained. No significant degenerative changes. No pars defects. Surgical clips are seen in the low central abdomen and anatomic pelvis.  IMPRESSION: No findings to explain the patient's pain.   Electronically Signed   By: Lorin Picket M.D.   On: 11/14/2013 08:24     MDM   Final diagnoses:  Midline low back pain with right-sided sciatica    47y.o. Female with lumbar back pain radiating to right buttock.  Patient with history of cervical cancer s/p hysterectomy.  Normal neuro exam.  Plain films obtained due to cancer history.  Plan addition of prednisone.  Discussed side effects especially of elevated blood sugar.  Discussed return precautions and need for follow up.  Patient with back pain.  No neurological deficits and normal neuro exam.  Patient can walk but states is painful.  No loss of bowel or bladder control.  No concern for cauda equina.  No fever, night sweats, weight loss IVDU.  RICE protocol and pain medicine indicated and discussed with patient.    Discussed previous reaction to ms- patient states itching only and has had dilaudid without difficulty.  Plan 2 mg dilaudid ptd for relief.  Note to return to work on Wednesday.  Patient cautioned to f/u if unable.   Shaune Pollack, MD 11/14/13 [phone]

## 2013-11-14 NOTE — ED Notes (Signed)
md at bedside, complete triage assessment deferred.

## 2013-11-14 NOTE — ED Notes (Signed)
Pt reports right sided low back pain x last week. Pt states she has been taking hydrocodone with no relief. Denies any dysuria or other c/o.

## 2013-11-14 NOTE — ED Notes (Signed)
Pt sister is here to pick up pt. D/c instructions reviewed, pt assisted to dress, taken to car in w/c, sister driving her home.

## 2014-05-23 DIAGNOSIS — Z794 Long term (current) use of insulin: Secondary | ICD-10-CM

## 2014-05-23 DIAGNOSIS — E1165 Type 2 diabetes mellitus with hyperglycemia: Secondary | ICD-10-CM | POA: Insufficient documentation

## 2014-05-23 DIAGNOSIS — N183 Chronic kidney disease, stage 3 unspecified: Secondary | ICD-10-CM

## 2014-05-23 DIAGNOSIS — IMO0002 Reserved for concepts with insufficient information to code with codable children: Secondary | ICD-10-CM | POA: Insufficient documentation

## 2014-05-23 HISTORY — DX: Long term (current) use of insulin: Z79.4

## 2014-05-23 HISTORY — DX: Reserved for concepts with insufficient information to code with codable children: IMO0002

## 2014-05-23 HISTORY — DX: Type 2 diabetes mellitus with hyperglycemia: E11.65

## 2014-05-23 HISTORY — DX: Chronic kidney disease, stage 3 unspecified: N18.30

## 2014-07-21 DIAGNOSIS — E663 Overweight: Secondary | ICD-10-CM | POA: Insufficient documentation

## 2014-07-21 HISTORY — DX: Overweight: E66.3

## 2014-09-30 ENCOUNTER — Emergency Department (HOSPITAL_BASED_OUTPATIENT_CLINIC_OR_DEPARTMENT_OTHER): Payer: 59

## 2014-09-30 ENCOUNTER — Emergency Department (HOSPITAL_BASED_OUTPATIENT_CLINIC_OR_DEPARTMENT_OTHER)
Admission: EM | Admit: 2014-09-30 | Discharge: 2014-09-30 | Disposition: A | Discharge status: Home / Self Care | Payer: 59 | Attending: Emergency Medicine | Admitting: Emergency Medicine

## 2014-09-30 ENCOUNTER — Encounter (HOSPITAL_BASED_OUTPATIENT_CLINIC_OR_DEPARTMENT_OTHER): Payer: Self-pay | Admitting: *Deleted

## 2014-09-30 DIAGNOSIS — I2510 Atherosclerotic heart disease of native coronary artery without angina pectoris: Secondary | ICD-10-CM | POA: Diagnosis not present

## 2014-09-30 DIAGNOSIS — Z8719 Personal history of other diseases of the digestive system: Secondary | ICD-10-CM | POA: Insufficient documentation

## 2014-09-30 DIAGNOSIS — Z79899 Other long term (current) drug therapy: Secondary | ICD-10-CM | POA: Diagnosis not present

## 2014-09-30 DIAGNOSIS — Z8541 Personal history of malignant neoplasm of cervix uteri: Secondary | ICD-10-CM | POA: Diagnosis not present

## 2014-09-30 DIAGNOSIS — E119 Type 2 diabetes mellitus without complications: Secondary | ICD-10-CM | POA: Diagnosis not present

## 2014-09-30 DIAGNOSIS — R0789 Other chest pain: Secondary | ICD-10-CM | POA: Diagnosis not present

## 2014-09-30 DIAGNOSIS — Z7952 Long term (current) use of systemic steroids: Secondary | ICD-10-CM | POA: Diagnosis not present

## 2014-09-30 DIAGNOSIS — Z7982 Long term (current) use of aspirin: Secondary | ICD-10-CM | POA: Insufficient documentation

## 2014-09-30 DIAGNOSIS — R079 Chest pain, unspecified: Secondary | ICD-10-CM | POA: Diagnosis present

## 2014-09-30 DIAGNOSIS — Z91040 Latex allergy status: Secondary | ICD-10-CM | POA: Insufficient documentation

## 2014-09-30 DIAGNOSIS — F329 Major depressive disorder, single episode, unspecified: Secondary | ICD-10-CM | POA: Insufficient documentation

## 2014-09-30 DIAGNOSIS — G629 Polyneuropathy, unspecified: Secondary | ICD-10-CM | POA: Diagnosis not present

## 2014-09-30 LAB — CBC
HEMATOCRIT: 39.7 % (ref 36.0–46.0)
Hemoglobin: 13.5 g/dL (ref 12.0–15.0)
MCH: 30.2 pg (ref 26.0–34.0)
MCHC: 34 g/dL (ref 30.0–36.0)
MCV: 88.8 fL (ref 78.0–100.0)
PLATELETS: 268 10*3/uL (ref 150–400)
RBC: 4.47 MIL/uL (ref 3.87–5.11)
RDW: 12.5 % (ref 11.5–15.5)
WBC: 5.5 10*3/uL (ref 4.0–10.5)

## 2014-09-30 LAB — COMPREHENSIVE METABOLIC PANEL
ALBUMIN: 4.3 g/dL (ref 3.5–5.0)
ALT: 23 U/L (ref 14–54)
AST: 24 U/L (ref 15–41)
Alkaline Phosphatase: 86 U/L (ref 38–126)
Anion gap: 11 (ref 5–15)
BILIRUBIN TOTAL: 0.6 mg/dL (ref 0.3–1.2)
BUN: 14 mg/dL (ref 6–20)
CO2: 27 mmol/L (ref 22–32)
Calcium: 9.3 mg/dL (ref 8.9–10.3)
Chloride: 97 mmol/L — ABNORMAL LOW (ref 101–111)
Creatinine, Ser: 1.42 mg/dL — ABNORMAL HIGH (ref 0.44–1.00)
GFR calc Af Amer: 49 mL/min — ABNORMAL LOW (ref >60)
GFR calc non Af Amer: 43 mL/min — ABNORMAL LOW (ref >60)
GLUCOSE: 391 mg/dL — AB (ref 65–99)
POTASSIUM: 4.3 mmol/L (ref 3.5–5.1)
Sodium: 135 mmol/L (ref 135–145)
TOTAL PROTEIN: 8 g/dL (ref 6.5–8.1)

## 2014-09-30 LAB — TROPONIN I: Troponin I: <0.03 ng/mL (ref <0.031)

## 2014-09-30 MED ORDER — OXYCODONE-ACETAMINOPHEN 5-325 MG PO TABS
1.0000 | ORAL_TABLET | Freq: Once | ORAL | Status: AC
  Administered 2014-09-30: 1 via ORAL
  Filled 2014-09-30: qty 1

## 2014-09-30 NOTE — ED Provider Notes (Signed)
CSN: [id number]     Arrival date & time 09/30/14  1234 History   First MD Initiated Contact with Patient 09/30/14 1535     Chief Complaint  Patient presents with  . Chest Pain    HPI   49 year old female presents today with chest pain. Patient reports symptoms started approximately 2 days ago in her left upper anterior chest wall with radiation across the top of her chest. The pain is "sore" and worse with palpation or upper body movements. 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 Patient reports that symptoms have persisted, have not worsened, not associated with nausea, vomiting, shortness of breath, or any other chest pain. Patient reports she's had similar symptoms previously that resolved on the run. She notes she's attempted using Tylenol at home with no relief of symptoms. Patient reports that she does not smoke, has not had any recent trauma or surgery, and does not use estrogen, has no lower extremity swelling or edema, no history of prolonged immobilization, no active malignancy, or any other significant risk factors for PE or DVT. She has never had DVT or PE. Patient requests pain medication for her chest pain, reporting that if she could just get the soreness to go away she would feel much better.  Past Medical History  Diagnosis Date  . Diabetes mellitus   . Depression   . Neuropathy   . GERD (gastroesophageal reflux disease)   . Coronary artery disease     30% lesions noted  . Cervical cancer    Past Surgical History  Procedure Laterality Date  . Abdominal hysterectomy      partial  . Cholecystectomy     History reviewed. No pertinent family history. Social History  Substance Use Topics  . Smoking status: Never Smoker   . Smokeless tobacco: None  . Alcohol Use: No   OB History    No data available     Review of Systems  All other systems reviewed and are negative.   Allergies  Latex and  Morphine  Home Medications   Prior to Admission medications   Medication Sig Start Date End Date Taking? Authorizing Provider  trimipramine (SURMONTIL) 25 MG capsule Take 25 mg by mouth at bedtime.   Yes Historical Provider, MD  aspirin 81 MG tablet Take 81 mg by mouth daily.    Historical Provider, MD  atorvastatin (LIPITOR) 80 MG tablet Take 80 mg by mouth daily.    Historical Provider, MD  carvedilol (COREG) 12.5 MG tablet Take 12.5 mg by mouth 2 (two) times daily with a meal.    Historical Provider, MD  fluticasone (FLONASE) 50 MCG/ACT nasal spray Place 2 sprays into the nose daily. 03/30/12 04/03/12  Carmin Muskrat, MD  gabapentin (NEURONTIN) 300 MG capsule Take 300 mg by mouth 3 (three) times daily.    Historical Provider, MD  HYDROcodone-acetaminophen (NORCO/VICODIN) 5-325 MG per tablet Take 1-2 tablets by mouth every 6 (six) hours as needed for moderate pain. 04/01/13   John Molpus, MD  metoCLOPramide (REGLAN) 5 MG tablet Take 5 mg by mouth 4 (four) times daily.    Historical Provider, MD  ondansetron (ZOFRAN ODT) 8 MG disintegrating tablet 8mg  ODT q8 hours prn nausea 07/05/12   April Palumbo, MD  ondansetron (ZOFRAN ODT) 8 MG disintegrating tablet Take 1 tablet (8 mg total) by mouth every 8 (eight) hours as needed. 04/01/13   John Molpus, MD  predniSONE (DELTASONE) 10 MG tablet  Take 2 tablets (20 mg total) by mouth daily. 11/14/13   Pattricia Boss, MD  promethazine (PHENERGAN) 25 MG tablet Take 25 mg by mouth every 6 (six) hours as needed for nausea or vomiting.    Historical Provider, MD  traMADol (ULTRAM) 50 MG tablet Take 1 tablet (50 mg total) by mouth every 6 (six) hours as needed for pain. 11/13/11   Dorie Rank, MD  traMADol (ULTRAM) 50 MG tablet Take 1 tablet (50 mg total) by mouth every 6 (six) hours as needed for pain. 07/05/12   April Palumbo, MD   BP 161/92 mmHg  Pulse 59  Temp(Src) 98.3 F (36.8 C) (Oral)  Resp 10  Ht 6' (1.829 m)  Wt 205 lb (92.987 kg)  BMI 27.80 kg/m2  SpO2  100%   Physical Exam  Constitutional: She is oriented to person, place, and time. She appears well-developed and well-nourished.  HENT:  Head: Normocephalic and atraumatic.  Eyes: Conjunctivae are normal. Pupils are equal, round, and reactive to light. Right eye exhibits no discharge. Left eye exhibits no discharge. No scleral icterus.  Neck: Normal range of motion. No JVD present. No tracheal deviation present.  Cardiovascular: Normal rate, regular rhythm, normal heart sounds and intact distal pulses.  Exam reveals no gallop and no friction rub.   No murmur heard. Pulmonary/Chest: Effort normal and breath sounds normal. No stridor. No respiratory distress. She has no wheezes. She has no rales. She exhibits no tenderness.  Tenderness to palpation of left anterior chest wall no signs of trauma or deformity.  Musculoskeletal: She exhibits no edema or tenderness.  No lower extremity edema or swelling.  Neurological: She is alert and oriented to person, place, and time. Coordination normal.  Skin: Skin is warm and dry.  Psychiatric: She has a normal mood and affect. Her behavior is normal. Judgment and thought content normal.  Nursing note and vitals reviewed.   ED Course  Procedures (including critical care time) Labs Review Labs Reviewed  COMPREHENSIVE METABOLIC PANEL - Abnormal; Notable for the following:    Chloride 97 (*)    Glucose, Bld 391 (*)    Creatinine, Ser 1.42 (*)    GFR calc non Af Amer 43 (*)    GFR calc Af Amer 49 (*)    All other components within normal limits  CBC  TROPONIN I    Imaging Review No results found. I have personally reviewed and evaluated these images and lab results as part of my medical decision-making.   EKG Interpretation   Date/Time:  Saturday September 30 2014 12:40:22 EDT Ventricular Rate:  78 PR Interval:  172 QRS Duration: 86 QT Interval:  392 QTC Calculation: 446 R Axis:   27 Text Interpretation:  Normal sinus rhythm Normal ECG  Confirmed by  ZACKOWSKI  MD, SCOTT ([id number]) on 09/30/2014 12:42:36 PM      MDM   Final diagnoses:  Chest wall pain    Labs: CBC, troponin, CMP- glucose 391, creatinine 1.4 to  Imaging: DG chest- no significant findings  Consults:  Therapeutics:  Discharge Meds:   Assessment/Plan: Patient presents with 2 days of left anterior chest wall pain. Pain is made worse with palpation and movement of the upper extremity, she is perk negative, heart score of 2, this is highly unlikely to be PE DVT, ACS. Patient requesting pain medication for the chest pain. She will be given a dose of pain medication here in the ED, she had good symptomatic improvement. She was discharged home with  instructions to use Tylenol as needed for pain. She is given strict return precautions, encouraged follow-up with her primary care provider in 2 days for reevaluation. Patient verbalized understanding and agreement today's plan. Patient called me back into the room after discharge requesting narcotic pain medication for home, LAD artery discussed pain management options with her and instructed her that I would not be giving her anything stronger than Tylenol or ibuprofen. Patient was upset that I would not give her something until she could follow up with her primary care doctor. I told her that outpatient use of narcotic pain medication for this chest wall pain is not appropriate. Patient was informed of her elevated glucose, reports that she is taking her medication, but does not check her blood sugar and adjust. She was encouraged to start checking her blood sugar, with adjustments in her insulin.          Okey Regal, PA-C 10/03/14 0128  Veryl Speak, MD 10/03/14 [phone]

## 2014-09-30 NOTE — ED Notes (Signed)
C/o chest discomfort, onset x 3days ago

## 2014-09-30 NOTE — ED Notes (Signed)
Notified PA of pts request for pain med.

## 2014-09-30 NOTE — ED Notes (Signed)
States has taken Tylenol w/o releif

## 2014-09-30 NOTE — ED Notes (Signed)
CP intermittently x 3-4 days.  Denies SOB.  Reports slight Nausea.

## 2014-09-30 NOTE — ED Notes (Addendum)
Pt does reports that the pain is sharp radiating into her chest, and up into her left jaw.  Denies back pain or arm pain.  Pt is tender on palpation of her substernal chest.

## 2014-09-30 NOTE — Discharge Instructions (Signed)
Chest Wall Pain Chest wall pain is pain in or around the bones and muscles of your chest. It may take up to 6 weeks to get better. It may take longer if you must stay physically active in your work and activities.  CAUSES  Chest wall pain may happen on its own. However, it may be caused by:  A viral illness like the flu.  Injury.  Coughing.  Exercise.  Arthritis.  Fibromyalgia.  Shingles. HOME CARE INSTRUCTIONS   Avoid overtiring physical activity. Try not to strain or perform activities that cause pain. This includes any activities using your chest or your abdominal and side muscles, especially if heavy weights are used.  Put ice on the sore area.  Put ice in a plastic bag.  Place a towel between your skin and the bag.  Leave the ice on for 15-20 minutes per hour while awake for the first 2 days.  Only take over-the-counter or prescription medicines for pain, discomfort, or fever as directed by your caregiver. SEEK IMMEDIATE MEDICAL CARE IF:   Your pain increases, or you are very uncomfortable.  You have a fever.  Your chest pain becomes worse.  You have new, unexplained symptoms.  You have nausea or vomiting.  You feel sweaty or lightheaded.  You have a cough with phlegm (sputum), or you cough up blood. MAKE SURE YOU:   Understand these instructions.  Will watch your condition.  Will get help right away if you are not doing well or get worse. Document Released: 01/27/2005 Document Revised: 04/21/2011 Document Reviewed: 09/23/2010 [facility] Patient Information 2015 [place], [place]. This information is not intended to replace advice given to you by your health care provider. Make sure you discuss any questions you have with your health care provider.  Please monitor for new or worsening signs or symptoms, return immediately if any present. Please follow up with primary care provider in 2-3 days for reevaluation.

## 2014-09-30 NOTE — ED Notes (Signed)
PA at bedside discussing pts request for pain med rx.

## 2015-08-10 DIAGNOSIS — F329 Major depressive disorder, single episode, unspecified: Secondary | ICD-10-CM | POA: Insufficient documentation

## 2015-08-10 DIAGNOSIS — F32A Depression, unspecified: Secondary | ICD-10-CM | POA: Insufficient documentation

## 2015-08-17 DIAGNOSIS — Z8541 Personal history of malignant neoplasm of cervix uteri: Secondary | ICD-10-CM

## 2015-08-17 HISTORY — DX: Personal history of malignant neoplasm of cervix uteri: Z85.41

## 2016-01-22 DIAGNOSIS — F419 Anxiety disorder, unspecified: Secondary | ICD-10-CM

## 2016-01-22 HISTORY — DX: Anxiety disorder, unspecified: F41.9

## 2016-01-23 DIAGNOSIS — G894 Chronic pain syndrome: Secondary | ICD-10-CM | POA: Insufficient documentation

## 2016-01-23 DIAGNOSIS — E1142 Type 2 diabetes mellitus with diabetic polyneuropathy: Secondary | ICD-10-CM

## 2016-01-23 HISTORY — DX: Chronic pain syndrome: G89.4

## 2016-01-23 HISTORY — DX: Type 2 diabetes mellitus with diabetic polyneuropathy: E11.42

## 2016-02-19 ENCOUNTER — Emergency Department (HOSPITAL_BASED_OUTPATIENT_CLINIC_OR_DEPARTMENT_OTHER)
Admission: EM | Admit: 2016-02-19 | Discharge: 2016-02-19 | Disposition: A | Discharge status: Home / Self Care | Payer: 59 | Attending: Emergency Medicine | Admitting: Emergency Medicine

## 2016-02-19 ENCOUNTER — Encounter (HOSPITAL_BASED_OUTPATIENT_CLINIC_OR_DEPARTMENT_OTHER): Payer: Self-pay

## 2016-02-19 DIAGNOSIS — Z79899 Other long term (current) drug therapy: Secondary | ICD-10-CM | POA: Diagnosis not present

## 2016-02-19 DIAGNOSIS — E119 Type 2 diabetes mellitus without complications: Secondary | ICD-10-CM | POA: Diagnosis not present

## 2016-02-19 DIAGNOSIS — H66001 Acute suppurative otitis media without spontaneous rupture of ear drum, right ear: Secondary | ICD-10-CM | POA: Diagnosis not present

## 2016-02-19 DIAGNOSIS — I10 Essential (primary) hypertension: Secondary | ICD-10-CM | POA: Insufficient documentation

## 2016-02-19 DIAGNOSIS — R5383 Other fatigue: Secondary | ICD-10-CM | POA: Diagnosis present

## 2016-02-19 DIAGNOSIS — I2510 Atherosclerotic heart disease of native coronary artery without angina pectoris: Secondary | ICD-10-CM | POA: Insufficient documentation

## 2016-02-19 MED ORDER — SODIUM CHLORIDE 0.9 % IV BOLUS (SEPSIS)
1000.0000 mL | Freq: Once | INTRAVENOUS | Status: AC
  Administered 2016-02-19: 1000 mL via INTRAVENOUS

## 2016-02-19 MED ORDER — ACETAMINOPHEN 500 MG PO TABS
1000.0000 mg | ORAL_TABLET | Freq: Once | ORAL | Status: AC
  Administered 2016-02-19: 1000 mg via ORAL
  Filled 2016-02-19: qty 2

## 2016-02-19 MED ORDER — AMOXICILLIN 500 MG PO CAPS: 1000.0000 mg | ORAL_CAPSULE | Freq: Two times a day (BID) | ORAL | 0 refills | Status: DC

## 2016-02-19 MED ORDER — KETOROLAC TROMETHAMINE 30 MG/ML IJ SOLN
15.0000 mg | Freq: Once | INTRAMUSCULAR | Status: AC
  Administered 2016-02-19: 15 mg via INTRAVENOUS
  Filled 2016-02-19: qty 1

## 2016-02-19 MED ORDER — ONDANSETRON 4 MG PO TBDP: ORAL_TABLET | ORAL | 0 refills | Status: DC

## 2016-02-19 MED ORDER — AMOXICILLIN 500 MG PO CAPS
1000.0000 mg | ORAL_CAPSULE | Freq: Once | ORAL | Status: AC
  Administered 2016-02-19: 1000 mg via ORAL
  Filled 2016-02-19: qty 2

## 2016-02-19 MED ORDER — ONDANSETRON HCL 4 MG/2ML IJ SOLN
4.0000 mg | Freq: Once | INTRAMUSCULAR | Status: AC
Start: 2016-02-19 — End: 2016-02-19
  Administered 2016-02-19: 4 mg via INTRAVENOUS
  Filled 2016-02-19: qty 2

## 2016-02-19 NOTE — ED Provider Notes (Signed)
[place] DE[facility] Provider Note   CSN: [id number] Arrival date & time: 02/19/16  1850  By signing my name below, I, Dora Sims, attest that this documentation has been prepared under the direction and in the presence of physician practitioner, Deno Etienne, DO. Electronically Signed: Dora Sims, Scribe. 02/19/2016. 8:22 PM.  History   Chief Complaint Chief Complaint  Patient presents with  . Fatigue    The history is provided by the patient. No language interpreter was used.  Weakness  Primary symptoms include no focal weakness, no loss of sensation, no dizziness. Primary symptoms comment: generalized weakness. This is a new problem. The current episode started more than 2 days ago. The problem has not changed since onset.There was no focality noted. There has been no fever. Pertinent negatives include no shortness of breath, no chest pain, no vomiting, no altered mental status, no confusion and no headaches. There were no medications administered prior to arrival. Associated medical issues do not include trauma.   HPI Comments: [name] is a 51 y.o. female who presents to the Emergency Department complaining of constant fatigue and generalized weakness for the last 5 days. She states she has been lightheaded upon standing and feels like she is going to pass out when she stands up. No LOC. She also notes nausea, generalized myalgias, decreased appetite, decreased fluid intake, nasal congestion, and some sinus pain since onset. She denies dizziness, ear pain, fever, chills, diarrhea, vomiting, or any other associated symptoms.  Past Medical History:  Diagnosis Date  . Cervical cancer ([place])   . Coronary artery disease    30% lesions noted  . Depression   . Diabetes mellitus   . GERD (gastroesophageal reflux disease)   . Neuropathy [facility])     Patient Active Problem List   Diagnosis Date Noted  . ANKLE PAIN, LEFT 12/25/2009  . DYSURIA 12/25/2009  . VAGINITIS,  CANDIDAL 12/25/2009  . ULCER-GASTRIC 09/28/2009  . DIABETIC PERIPHERAL NEUROPATHY 09/21/2009  . TOBACCO ABUSE 09/21/2009  . URINARY TRACT INFECTION 09/21/2009  . INSOMNIA 09/21/2009  . TRANSAMINASES, SERUM, ELEVATED 09/21/2009  . DIABETES MELLITUS-TYPE II 09/14/2009  . NAUSEA AND VOMITING 09/14/2009  . ABDOMINAL PAIN-EPIGASTRIC 09/14/2009  . CARDIOMYOPATHY, SECONDARY 09/06/2009  . HYPERTENSION 08/30/2009  . DIABETES MELLITUS, TYPE II, UNCONTROLLED 07/30/2009  . DYSLIPIDEMIA 07/30/2009  . GERD 07/30/2009  . GASTROPARESIS 07/30/2009  . CHEST PAIN, ATYPICAL 07/30/2009  . POSITIVE PPD 07/30/2009    Past Surgical History:  Procedure Laterality Date  . ABDOMINAL HYSTERECTOMY     partial  . CHOLECYSTECTOMY      OB History    No data available       Home Medications    Prior to Admission medications   Medication Sig Start Date End Date Taking? Authorizing Provider  LOSARTAN POTASSIUM PO Take by mouth.   Yes Historical Provider, MD  amoxicillin (AMOXIL) 500 MG capsule Take 2 capsules (1,000 mg total) by mouth 2 (two) times daily. 02/19/16   Deno Etienne, DO  aspirin 81 MG tablet Take 81 mg by mouth daily.    Historical Provider, MD  atorvastatin (LIPITOR) 80 MG tablet Take 80 mg by mouth daily.    Historical Provider, MD  carvedilol (COREG) 12.5 MG tablet Take 12.5 mg by mouth 2 (two) times daily with a meal.    Historical Provider, MD  fluticasone (FLONASE) 50 MCG/ACT nasal spray Place 2 sprays into the nose daily. 03/30/12 04/03/12  Carmin Muskrat, MD  gabapentin (NEURONTIN) 300 MG capsule Take  300 mg by mouth 3 (three) times daily.    Historical Provider, MD  HYDROcodone-acetaminophen (NORCO/VICODIN) 5-325 MG per tablet Take 1-2 tablets by mouth every 6 (six) hours as needed for moderate pain. 04/01/13   Shanon Rosser, MD  ondansetron (ZOFRAN ODT) 4 MG disintegrating tablet 4mg  ODT q4 hours prn nausea/vomit 02/19/16   Deno Etienne, DO  promethazine (PHENERGAN) 25 MG tablet Take 25 mg by  mouth every 6 (six) hours as needed for nausea or vomiting.    Historical Provider, MD    Family History No family history on file.  Social History Social History  Substance Use Topics  . Smoking status: Never Smoker  . Smokeless tobacco: Never Used  . Alcohol use Yes     Comment: occ     Allergies   Latex and Morphine   Review of Systems Review of Systems  Constitutional: Positive for activity change (decreased) and fatigue. Negative for chills and fever.  HENT: Positive for congestion and sinus pain.   Respiratory: Negative for shortness of breath.   Cardiovascular: Negative for chest pain.  Gastrointestinal: Positive for nausea. Negative for diarrhea and vomiting.  Musculoskeletal: Positive for myalgias (generalized).  Neurological: Positive for weakness and light-headedness. Negative for dizziness, focal weakness, syncope and headaches.  Psychiatric/Behavioral: Negative for confusion.  All other systems reviewed and are negative.    Physical Exam Updated Vital Signs BP 116/69 (BP Location: Left Arm)   Pulse 81   Temp 98.3 F (36.8 C) (Oral)   Resp 16   Ht 6' (1.829 m)   Wt 181 lb (82.1 kg)   SpO2 97%   BMI 24.55 kg/m   Physical Exam  Constitutional: She is oriented to person, place, and time. She appears well-developed and well-nourished. No distress.  HENT:  Head: Normocephalic and atraumatic.  Right Ear: Tympanic membrane is bulging. A middle ear effusion is present.  Right-sided purulent effusion and bulging of the landmarks. Postnasal drip. Swollen nasal turbinates.  Eyes: EOM are normal. Pupils are equal, round, and reactive to light.  Neck: Normal range of motion. Neck supple.  Cardiovascular: Normal rate and regular rhythm.  Exam reveals no gallop and no friction rub.   No murmur heard. Pulmonary/Chest: Effort normal. She has no wheezes. She has no rales.  Clear lung sounds.  Abdominal: Soft. She exhibits no distension. There is no tenderness.    Musculoskeletal: She exhibits no edema or tenderness.  Neurological: She is alert and oriented to person, place, and time.  Skin: Skin is warm and dry. She is not diaphoretic.  Psychiatric: She has a normal mood and affect. Her behavior is normal.  Nursing note and vitals reviewed.    ED Treatments / Results  Labs (all labs ordered are listed, but only abnormal results are displayed) Labs Reviewed - No data to display  EKG  EKG Interpretation None       Radiology No results found.  Procedures Procedures (including critical care time)  DIAGNOSTIC STUDIES: Oxygen Saturation is 97% on RA, normal by my interpretation.    COORDINATION OF CARE: 8:25 PM Discussed treatment plan with pt at bedside and pt agreed to plan.  Medications Ordered in ED Medications  sodium chloride 0.9 % bolus 1,000 mL (0 mLs Intravenous Stopped 02/19/16 2208)  ondansetron (ZOFRAN) injection 4 mg (4 mg Intravenous Given 02/19/16 2050)  ketorolac (TORADOL) 30 MG/ML injection 15 mg (15 mg Intravenous Given 02/19/16 2051)  acetaminophen (TYLENOL) tablet 1,000 mg (1,000 mg Oral Given 02/19/16 2052)  amoxicillin (  AMOXIL) capsule 1,000 mg (1,000 mg Oral Given 02/19/16 2052)     Initial Impression / Assessment and Plan / ED Course  I have reviewed the triage vital signs and the nursing notes.  Pertinent labs & imaging results that were available during my care of the patient were reviewed by me and considered in my medical decision making (see chart for details).  Clinical Course     51 yo F With a chief complaint of cough congestion fevers and lightheadedness when she stands. This been going on for about a week. Well appearing and Nontoxic on my exam. With history of orthostatics, will give a bolus of fluids. Patient's right TM appears to be infected. Start on antibiotics. Discharge home.   11:12 PM:  I have discussed the diagnosis/risks/treatment options with the patient and believe the pt to be eligible for  discharge home to follow-up with PCP. We also discussed returning to the ED immediately if new or worsening sx occur. We discussed the sx which are most concerning (e.g., sudden worsening pain, fever, inability to tolerate by mouth) that necessitate immediate return. Medications administered to the patient during their visit and any new prescriptions provided to the patient are listed below.  Medications given during this visit Medications  sodium chloride 0.9 % bolus 1,000 mL (0 mLs Intravenous Stopped 02/19/16 2208)  ondansetron (ZOFRAN) injection 4 mg (4 mg Intravenous Given 02/19/16 2050)  ketorolac (TORADOL) 30 MG/ML injection 15 mg (15 mg Intravenous Given 02/19/16 2051)  acetaminophen (TYLENOL) tablet 1,000 mg (1,000 mg Oral Given 02/19/16 2052)  amoxicillin (AMOXIL) capsule 1,000 mg (1,000 mg Oral Given 02/19/16 2052)     The patient appears reasonably screen and/or stabilized for discharge and I doubt any other medical condition or other [facility] requiring further screening, evaluation, or treatment in the ED at this time prior to discharge.   Final Clinical Impressions(s) / ED Diagnoses   Final diagnoses:  Acute suppurative otitis media of right ear without spontaneous rupture of tympanic membrane, recurrence not specified    New Prescriptions Discharge Medication List as of 02/19/2016  9:53 PM    START taking these medications   Details  amoxicillin (AMOXIL) 500 MG capsule Take 2 capsules (1,000 mg total) by mouth 2 (two) times daily., Starting Tue 02/19/2016, Print        I personally performed the services described in this documentation, which was scribed in my presence. The recorded information has been reviewed and is accurate.    Deno Etienne, DO 02/19/16 2312

## 2016-02-19 NOTE — ED Triage Notes (Signed)
C/o sinus congestion, dizziness x 5 days-NAD-steady gait

## 2016-02-19 NOTE — Discharge Instructions (Signed)
Take tylenol 2 pills 4 times a day and motrin 4 pills 3 times a day.  Drink plenty of fluids.  Return for worsening shortness of breath, headache, confusion. Follow up with your family doctor.   

## 2016-02-19 NOTE — ED Notes (Signed)
ED Provider at bedside. 

## 2016-05-14 DIAGNOSIS — M47816 Spondylosis without myelopathy or radiculopathy, lumbar region: Secondary | ICD-10-CM

## 2016-05-14 HISTORY — DX: Spondylosis without myelopathy or radiculopathy, lumbar region: M47.816

## 2016-06-16 ENCOUNTER — Encounter (HOSPITAL_BASED_OUTPATIENT_CLINIC_OR_DEPARTMENT_OTHER): Payer: Self-pay | Admitting: *Deleted

## 2016-06-16 ENCOUNTER — Emergency Department (HOSPITAL_BASED_OUTPATIENT_CLINIC_OR_DEPARTMENT_OTHER): Payer: 59

## 2016-06-16 ENCOUNTER — Emergency Department (HOSPITAL_BASED_OUTPATIENT_CLINIC_OR_DEPARTMENT_OTHER)
Admission: EM | Admit: 2016-06-16 | Discharge: 2016-06-16 | Disposition: A | Discharge status: Home / Self Care | Payer: 59 | Attending: Emergency Medicine | Admitting: Emergency Medicine

## 2016-06-16 DIAGNOSIS — I2510 Atherosclerotic heart disease of native coronary artery without angina pectoris: Secondary | ICD-10-CM | POA: Insufficient documentation

## 2016-06-16 DIAGNOSIS — E119 Type 2 diabetes mellitus without complications: Secondary | ICD-10-CM | POA: Diagnosis not present

## 2016-06-16 DIAGNOSIS — R1031 Right lower quadrant pain: Secondary | ICD-10-CM | POA: Insufficient documentation

## 2016-06-16 DIAGNOSIS — Z8541 Personal history of malignant neoplasm of cervix uteri: Secondary | ICD-10-CM | POA: Diagnosis not present

## 2016-06-16 DIAGNOSIS — R197 Diarrhea, unspecified: Secondary | ICD-10-CM | POA: Diagnosis not present

## 2016-06-16 DIAGNOSIS — Z7982 Long term (current) use of aspirin: Secondary | ICD-10-CM | POA: Diagnosis not present

## 2016-06-16 DIAGNOSIS — Z79899 Other long term (current) drug therapy: Secondary | ICD-10-CM | POA: Insufficient documentation

## 2016-06-16 DIAGNOSIS — R112 Nausea with vomiting, unspecified: Secondary | ICD-10-CM | POA: Diagnosis not present

## 2016-06-16 LAB — URINALYSIS, ROUTINE W REFLEX MICROSCOPIC
BILIRUBIN URINE: NEGATIVE
Glucose, UA: >=500 mg/dL — AB
HGB URINE DIPSTICK: NEGATIVE
Ketones, ur: 15 mg/dL — AB
Leukocytes, UA: NEGATIVE
Nitrite: NEGATIVE
PH: 5 (ref 5.0–8.0)
Protein, ur: 30 mg/dL — AB
SPECIFIC GRAVITY, URINE: 1.029 (ref 1.005–1.030)

## 2016-06-16 LAB — CBC
HCT: 40.1 % (ref 36.0–46.0)
HEMOGLOBIN: 13.6 g/dL (ref 12.0–15.0)
MCH: 29.6 pg (ref 26.0–34.0)
MCHC: 33.9 g/dL (ref 30.0–36.0)
MCV: 87.2 fL (ref 78.0–100.0)
Platelets: 287 10*3/uL (ref 150–400)
RBC: 4.6 MIL/uL (ref 3.87–5.11)
RDW: 14.1 % (ref 11.5–15.5)
WBC: 12.9 10*3/uL — ABNORMAL HIGH (ref 4.0–10.5)

## 2016-06-16 LAB — COMPREHENSIVE METABOLIC PANEL
ALBUMIN: 4.1 g/dL (ref 3.5–5.0)
ALK PHOS: 85 U/L (ref 38–126)
ALT: 27 U/L (ref 14–54)
ANION GAP: 11 (ref 5–15)
AST: 31 U/L (ref 15–41)
BUN: 33 mg/dL — ABNORMAL HIGH (ref 6–20)
CALCIUM: 9.4 mg/dL (ref 8.9–10.3)
CHLORIDE: 100 mmol/L — AB (ref 101–111)
CO2: 29 mmol/L (ref 22–32)
Creatinine, Ser: 1.34 mg/dL — ABNORMAL HIGH (ref 0.44–1.00)
GFR calc Af Amer: 52 mL/min — ABNORMAL LOW (ref >60)
GFR calc non Af Amer: 45 mL/min — ABNORMAL LOW (ref >60)
GLUCOSE: 126 mg/dL — AB (ref 65–99)
Potassium: 3.8 mmol/L (ref 3.5–5.1)
SODIUM: 140 mmol/L (ref 135–145)
Total Bilirubin: 0.3 mg/dL (ref 0.3–1.2)
Total Protein: 8 g/dL (ref 6.5–8.1)

## 2016-06-16 LAB — URINALYSIS, MICROSCOPIC (REFLEX): RBC / HPF: NONE SEEN RBC/hpf (ref 0–5)

## 2016-06-16 LAB — LIPASE, BLOOD: LIPASE: 26 U/L (ref 11–51)

## 2016-06-16 MED ORDER — SODIUM CHLORIDE 0.9 % IV BOLUS (SEPSIS)
2000.0000 mL | Freq: Once | INTRAVENOUS | Status: AC
  Administered 2016-06-16: 2000 mL via INTRAVENOUS

## 2016-06-16 MED ORDER — IOPAMIDOL (ISOVUE-300) INJECTION 61%
100.0000 mL | Freq: Once | INTRAVENOUS | Status: AC | PRN
  Administered 2016-06-16: 100 mL via INTRAVENOUS

## 2016-06-16 MED ORDER — HYDROMORPHONE HCL 1 MG/ML IJ SOLN
0.5000 mg | Freq: Once | INTRAMUSCULAR | Status: AC
  Administered 2016-06-16: 0.5 mg via INTRAVENOUS
  Filled 2016-06-16: qty 1

## 2016-06-16 MED ORDER — ONDANSETRON HCL 4 MG/2ML IJ SOLN
4.0000 mg | Freq: Once | INTRAMUSCULAR | Status: AC
  Administered 2016-06-16: 4 mg via INTRAVENOUS

## 2016-06-16 MED ORDER — HYDROMORPHONE HCL 1 MG/ML IJ SOLN
1.0000 mg | Freq: Once | INTRAMUSCULAR | Status: AC
  Administered 2016-06-16: 1 mg via INTRAVENOUS
  Filled 2016-06-16: qty 1

## 2016-06-16 MED ORDER — PROMETHAZINE HCL 25 MG PO TABS: 25.0000 mg | ORAL_TABLET | Freq: Four times a day (QID) | ORAL | 0 refills | Status: DC | PRN

## 2016-06-16 MED ORDER — ONDANSETRON HCL 4 MG/2ML IJ SOLN
4.0000 mg | Freq: Once | INTRAMUSCULAR | Status: DC | PRN
  Filled 2016-06-16 (×2): qty 2

## 2016-06-16 MED ORDER — ONDANSETRON HCL 4 MG/2ML IJ SOLN
4.0000 mg | Freq: Once | INTRAMUSCULAR | Status: AC
  Administered 2016-06-16: 4 mg via INTRAVENOUS
  Filled 2016-06-16: qty 2

## 2016-06-16 MED FILL — PROMETHAZINE 25 MG TABLET: 25 | 3 days supply | Qty: 12 | Fill #0

## 2016-06-16 NOTE — ED Provider Notes (Signed)
[place] DEPT [facility] Provider Note   CSN: [id number] Arrival date & time: 06/16/16  1055     History   Chief Complaint Chief Complaint  Patient presents with  . Abdominal Pain    HPI [name] is a 51 y.o. female.Complains of right lower quadrant abdominal pain radiating to back onset yesterday 9 PM, gradual. She's had 4- 5 episodes of vomiting and 4 or 5 episodes of diarrhea since onset of pain. Pain is constant and nothing makes symptoms better or worse. She treated herself with Phenergan at home. She reports no further episodes of vomiting but she does continue to feel nauseated. No fever. No urinary symptoms. She feels dehydrated No other associated symptoms  HPI  Past Medical History:  Diagnosis Date  . Cervical cancer ([place])   . Coronary artery disease    30% lesions noted  . Depression   . Diabetes mellitus   . GERD (gastroesophageal reflux disease)   . Neuropathy     Patient Active Problem List   Diagnosis Date Noted  . ANKLE PAIN, LEFT 12/25/2009  . DYSURIA 12/25/2009  . VAGINITIS, CANDIDAL 12/25/2009  . ULCER-GASTRIC 09/28/2009  . DIABETIC PERIPHERAL NEUROPATHY 09/21/2009  . TOBACCO ABUSE 09/21/2009  . URINARY TRACT INFECTION 09/21/2009  . INSOMNIA 09/21/2009  . TRANSAMINASES, SERUM, ELEVATED 09/21/2009  . DIABETES MELLITUS-TYPE II 09/14/2009  . NAUSEA AND VOMITING 09/14/2009  . ABDOMINAL PAIN-EPIGASTRIC 09/14/2009  . CARDIOMYOPATHY, SECONDARY 09/06/2009  . HYPERTENSION 08/30/2009  . DIABETES MELLITUS, TYPE II, UNCONTROLLED 07/30/2009  . DYSLIPIDEMIA 07/30/2009  . GERD 07/30/2009  . GASTROPARESIS 07/30/2009  . CHEST PAIN, ATYPICAL 07/30/2009  . POSITIVE PPD 07/30/2009    Past Surgical History:  Procedure Laterality Date  . ABDOMINAL HYSTERECTOMY     partial  . CHOLECYSTECTOMY      OB History    No data available       Home Medications    Prior to Admission medications   Medication Sig Start Date End Date Taking? Authorizing  Provider  aspirin 81 MG tablet Take 81 mg by mouth daily.   Yes [provider]  atorvastatin (LIPITOR) 80 MG tablet Take 80 mg by mouth daily.   Yes [provider]  carvedilol (COREG) 12.5 MG tablet Take 12.5 mg by mouth 2 (two) times daily with a meal.   Yes [provider]  fluticasone (FLONASE) 50 MCG/ACT nasal spray Place 2 sprays into the nose daily. 03/30/12 06/16/16 Yes Carmin Muskrat, MD  gabapentin (NEURONTIN) 300 MG capsule Take 300 mg by mouth 3 (three) times daily.   Yes [provider]  LOSARTAN POTASSIUM PO Take by mouth.   Yes [provider]  promethazine (PHENERGAN) 25 MG tablet Take 25 mg by mouth every 6 (six) hours as needed for nausea or vomiting.   Yes [provider]  amoxicillin (AMOXIL) 500 MG capsule Take 2 capsules (1,000 mg total) by mouth 2 (two) times daily. 02/19/16   Deno Etienne, DO  HYDROcodone-acetaminophen (NORCO/VICODIN) 5-325 MG per tablet Take 1-2 tablets by mouth every 6 (six) hours as needed for moderate pain. 04/01/13   Molpus, John, MD  ondansetron (ZOFRAN ODT) 4 MG disintegrating tablet 4mg  ODT q4 hours prn nausea/vomit 02/19/16   Deno Etienne, DO    Family History No family history on file.  Social History Social History  Substance Use Topics  . Smoking status: Never Smoker  . Smokeless tobacco: Never Used  . Alcohol use Yes     Comment: occ  Allergies   Latex and Morphine   Review of Systems Review of Systems  Constitutional: Negative.   HENT: Negative.   Respiratory: Negative.   Cardiovascular: Negative.   Gastrointestinal: Positive for abdominal pain, diarrhea, nausea and vomiting.       No blood per rectum no hematemesis  Musculoskeletal: Negative.   Skin: Negative.   Allergic/Immunologic: Positive for immunocompromised state.       Diabetic  Neurological: Negative.   Psychiatric/Behavioral: Negative.   All other systems reviewed and are negative.    Physical Exam Updated  Vital Signs BP 115/84   Pulse (!) 104   Temp 98.4 F (36.9 C) (Oral)   Resp 18   Ht 6' (1.829 m)   Wt 188 lb (85.3 kg)   SpO2 99%   BMI 25.50 kg/m   Physical Exam  Constitutional: She appears well-developed and well-nourished. No distress.  HENT:  Head: Normocephalic and atraumatic.  Mucous membranes dry  Eyes: Conjunctivae are normal. Pupils are equal, round, and reactive to light.  Neck: Neck supple. No tracheal deviation present. No thyromegaly present.  Cardiovascular: Normal rate and regular rhythm.   No murmur heard. Pulmonary/Chest: Effort normal and breath sounds normal.  Abdominal: Soft. Bowel sounds are normal. She exhibits no distension. There is tenderness.  Tender at right lower quadrant no guarding rigidity or rebound  Genitourinary:  Genitourinary Comments: No flank tenderness  Musculoskeletal: Normal range of motion. She exhibits no edema or tenderness.  Neurological: She is alert. Coordination normal.  Skin: Skin is warm and dry. No rash noted.  Psychiatric: She has a normal mood and affect.  Nursing note and vitals reviewed.    ED Treatments / Results  Labs (all labs ordered are listed, but only abnormal results are displayed) Labs Reviewed  LIPASE, BLOOD  COMPREHENSIVE METABOLIC PANEL  CBC  URINALYSIS, ROUTINE W REFLEX MICROSCOPIC    EKG  EKG Interpretation None       Radiology No results found.  Procedures Procedures (including critical care time)  Medications Ordered in ED Medications  sodium chloride 0.9 % bolus 2,000 mL (not administered)  ondansetron (ZOFRAN) injection 4 mg (not administered)  HYDROmorphone (DILAUDID) injection 1 mg (not administered)    Results for orders placed or performed during the hospital encounter of 06/16/16  Lipase, blood  Result Value Ref Range   Lipase 26 11 - 51 U/L  Comprehensive metabolic panel  Result Value Ref Range   Sodium 140 135 - 145 mmol/L   Potassium 3.8 3.5 - 5.1 mmol/L   Chloride  100 (L) 101 - 111 mmol/L   CO2 29 22 - 32 mmol/L   Glucose, Bld 126 (H) 65 - 99 mg/dL   BUN 33 (H) 6 - 20 mg/dL   Creatinine, Ser 1.34 (H) 0.44 - 1.00 mg/dL   Calcium 9.4 8.9 - 10.3 mg/dL   Total Protein 8.0 6.5 - 8.1 g/dL   Albumin 4.1 3.5 - 5.0 g/dL   AST 31 15 - 41 U/L   ALT 27 14 - 54 U/L   Alkaline Phosphatase 85 38 - 126 U/L   Total Bilirubin 0.3 0.3 - 1.2 mg/dL   GFR calc non Af Amer 45 (L) >60 mL/min   GFR calc Af Amer 52 (L) >60 mL/min   Anion gap 11 5 - 15  CBC  Result Value Ref Range   WBC 12.9 (H) 4.0 - 10.5 K/uL   RBC 4.60 3.87 - 5.11 MIL/uL   Hemoglobin 13.6 12.0 - 15.0 g/dL  HCT 40.1 36.0 - 46.0 %   MCV 87.2 78.0 - 100.0 fL   MCH 29.6 26.0 - 34.0 pg   MCHC 33.9 30.0 - 36.0 g/dL   RDW 14.1 11.5 - 15.5 %   Platelets 287 150 - 400 K/uL  Urinalysis, Routine w reflex microscopic  Result Value Ref Range   Color, Urine YELLOW YELLOW   APPearance CLOUDY (A) CLEAR   Specific Gravity, Urine 1.029 1.005 - 1.030   pH 5.0 5.0 - 8.0   Glucose, UA >=500 (A) NEGATIVE mg/dL   Hgb urine dipstick NEGATIVE NEGATIVE   Bilirubin Urine NEGATIVE NEGATIVE   Ketones, ur 15 (A) NEGATIVE mg/dL   Protein, ur 30 (A) NEGATIVE mg/dL   Nitrite NEGATIVE NEGATIVE   Leukocytes, UA NEGATIVE NEGATIVE  Urinalysis, Microscopic (reflex)  Result Value Ref Range   RBC / HPF NONE SEEN 0 - 5 RBC/hpf   WBC, UA 0-5 0 - 5 WBC/hpf   Bacteria, UA RARE (A) NONE SEEN   Squamous Epithelial / LPF 0-5 (A) NONE SEEN   Hyaline Casts, UA PRESENT    Ct Abdomen Pelvis W Contrast  Result Date: 06/16/2016 CLINICAL DATA:  Right lower quadrant pain with nausea and vomiting. History of cervical carcinoma EXAM: CT ABDOMEN AND PELVIS WITH CONTRAST TECHNIQUE: Multidetector CT imaging of the abdomen and pelvis was performed using the standard protocol following bolus administration of intravenous contrast. Oral contrast was also administered. CONTRAST:  115mL ISOVUE-300 IOPAMIDOL (ISOVUE-300) INJECTION 61% COMPARISON:   Jul 05, 2012 FINDINGS: Lower chest: There is mild posterior basilar atelectatic change. Lung bases otherwise are clear. Hepatobiliary: No focal liver lesions are evident. Gallbladder is absent. There is no appreciable biliary duct dilatation. Pancreas: No pancreatic mass or inflammatory focus. Spleen: No splenic lesions are evident. Adrenals/Urinary Tract: Adrenals appear unremarkable bilaterally. There is scarring in the lateral upper mid kidney region on each side. There is no evident renal mass or hydronephrosis on either side. There is no renal or ureteral calculus on either side. Urinary bladder is midline with wall thickness mildly increased in a generalized manner. Stomach/Bowel: There is wall thickening of multiple loops of jejunum with throughout much of the left abdomen. More distally, small bowel loops appear unremarkable. There is no transition zone to suggest bowel obstruction. No free air or portal venous air. Vascular/Lymphatic: There is atherosclerotic calcification in the aorta and common iliac arteries, more severe on the right than on the left. No abdominal aortic aneurysm. Major mesenteric vessels appear unremarkable. There is no adenopathy in the abdomen or pelvis. Reproductive: Uterus is absent. There are multiple clips throughout the pelvis and lower abdomen, apparently indicative of prior lymph node dissection. No evident pelvic mass. Other: There is moderate ascites in the pelvis extending into the right upper quadrant along the border of the liver. No abscess is evident in the abdomen or pelvis. Appendix appears normal. There is a minimal ventral hernia containing only fat. Musculoskeletal: There is degenerative change in the lower thoracic and lumbar spine regions. There is no evident blastic or lytic bone lesion. There is no intramuscular or abdominal wall lesion evident. IMPRESSION: Multiple loops of thickened jejunal walls with ascites. Suspect enteritis with ascites due to sympathetic  type response to the enteritis. No bowel obstruction. No abscess. Appendix appears normal. Wall thickening in the urinary bladder consistent with cystitis. No hydronephrosis on either side. No renal or ureteral calculus. Uterus absent with evidence of prior pelvic lymph node dissection. No adenopathy evident currently. Gallbladder absent. Aortoiliac  atherosclerosis. Minimal ventral hernia containing only fat. Electronically Signed   By: Lowella Grip III M.D.   On: 06/16/2016 13:43   Initial Impression / Assessment and Plan / ED Course  I have reviewed the triage vital signs and the nursing notes.  Pertinent labs & imaging results that were available during my care of the patient were reviewed by me and considered in my medical decision making (see chart for details).    2:50 PM feels improved and able to drink water without  vomiting after treatment with venous hydration and intravenous Zofran. Patient prefers prescription for Phenergan which I will write. Plan Tylenol for pain. Encourage oral hydration. Renal insufficiency is chronic. Follow-up with PMD if not feeling better in 2 or 3 days  Final Clinical Impressions(s) / ED Diagnoses  Diagnosis #1 right lower quadrant abdominal pain #2 nausea vomiting and diarrhea #3 chronic renal insufficiency Final diagnoses:  None    New Prescriptions New Prescriptions   No medications on file     Orlie Dakin, MD 06/16/16 1501

## 2016-06-16 NOTE — ED Notes (Signed)
Patient transported to CT 

## 2016-06-16 NOTE — Discharge Instructions (Signed)
Take Imodium as directed for diarrhea. Avoid milk or foods containing milk such as cheese or ice cream all having diarrhea Make sure that you drink at least six 8 ounce glasses of water or sugar free Gatorade each day in order to stay well-hydrated. Take the medication prescribed as needed for nausea. Take Tylenol as needed for pain See your primary care physician if not feeling better in 3 or 4 days. Return if you continue to vomit after taking the medication prescribed or if you feel worse for any reason.

## 2016-06-16 NOTE — ED Triage Notes (Signed)
Sharp pain in her lower abdomen with radiation into her back since 9pm last night. Vomiting and diarrhea. She feels like it is pancreatitis.

## 2016-06-16 NOTE — ED Notes (Signed)
ED Provider at bedside. 

## 2016-11-27 DIAGNOSIS — E7800 Pure hypercholesterolemia, unspecified: Secondary | ICD-10-CM

## 2016-11-27 DIAGNOSIS — E1043 Type 1 diabetes mellitus with diabetic autonomic (poly)neuropathy: Secondary | ICD-10-CM

## 2016-11-27 DIAGNOSIS — K3184 Gastroparesis: Secondary | ICD-10-CM

## 2016-11-27 HISTORY — DX: Type 1 diabetes mellitus with diabetic autonomic (poly)neuropathy: E10.43

## 2016-11-27 HISTORY — DX: Pure hypercholesterolemia, unspecified: E78.00

## 2017-03-11 DIAGNOSIS — M7741 Metatarsalgia, right foot: Secondary | ICD-10-CM

## 2017-03-11 DIAGNOSIS — M7742 Metatarsalgia, left foot: Secondary | ICD-10-CM

## 2017-03-11 HISTORY — DX: Metatarsalgia, right foot: M77.41

## 2017-03-11 HISTORY — DX: Metatarsalgia, left foot: M77.42

## 2017-07-06 ENCOUNTER — Encounter (HOSPITAL_BASED_OUTPATIENT_CLINIC_OR_DEPARTMENT_OTHER): Payer: Self-pay

## 2017-07-06 ENCOUNTER — Inpatient Hospital Stay (HOSPITAL_BASED_OUTPATIENT_CLINIC_OR_DEPARTMENT_OTHER)
Admission: EM | Admit: 2017-07-06 | Discharge: 2017-07-11 | DRG: 392 | Disposition: A | Discharge status: Home / Self Care | Payer: 59 | Attending: Family Medicine | Admitting: Family Medicine

## 2017-07-06 ENCOUNTER — Other Ambulatory Visit: Payer: Self-pay

## 2017-07-06 ENCOUNTER — Emergency Department (HOSPITAL_BASED_OUTPATIENT_CLINIC_OR_DEPARTMENT_OTHER): Payer: 59

## 2017-07-06 DIAGNOSIS — I129 Hypertensive chronic kidney disease with stage 1 through stage 4 chronic kidney disease, or unspecified chronic kidney disease: Secondary | ICD-10-CM | POA: Diagnosis present

## 2017-07-06 DIAGNOSIS — N3000 Acute cystitis without hematuria: Secondary | ICD-10-CM | POA: Diagnosis present

## 2017-07-06 DIAGNOSIS — I2510 Atherosclerotic heart disease of native coronary artery without angina pectoris: Secondary | ICD-10-CM

## 2017-07-06 DIAGNOSIS — R1013 Epigastric pain: Principal | ICD-10-CM | POA: Diagnosis present

## 2017-07-06 DIAGNOSIS — K219 Gastro-esophageal reflux disease without esophagitis: Secondary | ICD-10-CM | POA: Diagnosis present

## 2017-07-06 DIAGNOSIS — Z888 Allergy status to other drugs, medicaments and biological substances status: Secondary | ICD-10-CM

## 2017-07-06 DIAGNOSIS — I160 Hypertensive urgency: Secondary | ICD-10-CM | POA: Diagnosis present

## 2017-07-06 DIAGNOSIS — Z885 Allergy status to narcotic agent status: Secondary | ICD-10-CM

## 2017-07-06 DIAGNOSIS — I10 Essential (primary) hypertension: Secondary | ICD-10-CM | POA: Diagnosis present

## 2017-07-06 DIAGNOSIS — R109 Unspecified abdominal pain: Secondary | ICD-10-CM

## 2017-07-06 DIAGNOSIS — R112 Nausea with vomiting, unspecified: Secondary | ICD-10-CM | POA: Diagnosis present

## 2017-07-06 DIAGNOSIS — Z765 Malingerer [conscious simulation]: Secondary | ICD-10-CM

## 2017-07-06 DIAGNOSIS — E1122 Type 2 diabetes mellitus with diabetic chronic kidney disease: Secondary | ICD-10-CM | POA: Diagnosis present

## 2017-07-06 DIAGNOSIS — Z794 Long term (current) use of insulin: Secondary | ICD-10-CM

## 2017-07-06 DIAGNOSIS — N3090 Cystitis, unspecified without hematuria: Secondary | ICD-10-CM

## 2017-07-06 DIAGNOSIS — Z7982 Long term (current) use of aspirin: Secondary | ICD-10-CM

## 2017-07-06 DIAGNOSIS — K2970 Gastritis, unspecified, without bleeding: Secondary | ICD-10-CM | POA: Diagnosis not present

## 2017-07-06 DIAGNOSIS — K5900 Constipation, unspecified: Secondary | ICD-10-CM | POA: Diagnosis present

## 2017-07-06 DIAGNOSIS — Z91040 Latex allergy status: Secondary | ICD-10-CM

## 2017-07-06 DIAGNOSIS — N183 Chronic kidney disease, stage 3 unspecified: Secondary | ICD-10-CM | POA: Diagnosis present

## 2017-07-06 DIAGNOSIS — N179 Acute kidney failure, unspecified: Secondary | ICD-10-CM | POA: Diagnosis present

## 2017-07-06 DIAGNOSIS — Z8541 Personal history of malignant neoplasm of cervix uteri: Secondary | ICD-10-CM

## 2017-07-06 DIAGNOSIS — Z79899 Other long term (current) drug therapy: Secondary | ICD-10-CM

## 2017-07-06 DIAGNOSIS — E785 Hyperlipidemia, unspecified: Secondary | ICD-10-CM | POA: Diagnosis present

## 2017-07-06 DIAGNOSIS — E1140 Type 2 diabetes mellitus with diabetic neuropathy, unspecified: Secondary | ICD-10-CM | POA: Diagnosis present

## 2017-07-06 MED ORDER — ONDANSETRON HCL 4 MG/2ML IJ SOLN
4.0000 mg | Freq: Once | INTRAMUSCULAR | Status: AC
Start: 2017-07-06 — End: 2017-07-06
  Administered 2017-07-06: 4 mg via INTRAVENOUS
  Filled 2017-07-06: qty 2

## 2017-07-06 MED ORDER — FENTANYL CITRATE (PF) 100 MCG/2ML IJ SOLN
100.0000 ug | Freq: Once | INTRAMUSCULAR | Status: AC
  Administered 2017-07-07: 100 ug via INTRAVENOUS
  Filled 2017-07-06: qty 2

## 2017-07-06 NOTE — ED Provider Notes (Signed)
[facility] Provider Note: [name], MD, FACEP  CSN: [id number] MRN: [id number] ARRIVAL: 07/06/17 at 2142 ROOM: [place]  Vomiting   HISTORY OF PRESENT ILLNESS  07/06/17 11:26 PM [name] is a 52 y.o. female who was seen at [facility] regional yesterday for a 2-day history of nausea, vomiting and diarrhea with abdominal pain.  She was diagnosed with a urinary tract infection.  She was given given IV fluids and discharged on Cipro, Zantac and Zofran.  She has not been able to keep her medications down today.  Any attempt to eat or drink causes her to vomit. She has not had much diarrhea.  Yesterday she developed pain in her right flank radiating to her right lower quadrant.  She rates his pain as a 9.5 out of 10.  It is not significantly worse with movement or palpation.  She does not know if she has had a fever but has had chills.  Although the patient denied having x-rays at [facility] regional in fact she had a CT of the abdomen and pelvis which was unremarkable except for inflammatory changes around the bladder consistent with cystitis.   Past Medical History:  Diagnosis Date  . Cervical cancer ([place])   . Coronary artery disease    30% lesions noted  . Depression   . Diabetes mellitus   . GERD (gastroesophageal reflux disease)   . Neuropathy     Past Surgical History:  Procedure Laterality Date  . ABDOMINAL HYSTERECTOMY     partial  . CHOLECYSTECTOMY      No family history on file.  Social History   Tobacco Use  . Smoking status: Never Smoker  . Smokeless tobacco: Never Used  Substance Use Topics  . Alcohol use: Yes    Comment: occ  . Drug use: No    Prior to Admission medications   Medication Sig Start Date End Date Taking? Authorizing Provider  aspirin 81 MG tablet Take 81 mg by mouth daily.   Yes [provider]  atorvastatin (LIPITOR) 80 MG tablet Take 80 mg by mouth daily.   Yes [provider]  carvedilol (COREG) 12.5 MG tablet Take 12.5 mg by mouth 2 (two) times daily with a meal.   Yes [provider]  gabapentin (NEURONTIN) 300 MG capsule Take 300 mg by mouth 3 (three) times daily.   Yes [provider]  insulin degludec (TRESIBA FLEXTOUCH) 100 UNIT/ML SOPN FlexTouch Pen Inject 40 Units into the skin daily at 10 pm.   Yes [provider]  oxycodone (OXY-IR) 5 MG capsule Take 5 mg by mouth every 6 (six) hours as needed.   Yes [provider]  tiZANidine (ZANAFLEX) 4 MG tablet Take 4 mg by mouth every 6 (six) hours as needed for muscle spasms.   Yes [provider]  amoxicillin (AMOXIL) 500 MG capsule Take 2 capsules (1,000 mg total) by mouth 2 (two) times daily. 02/19/16   Deno Etienne, DO  fluticasone (FLONASE) 50 MCG/ACT nasal spray Place 2 sprays into the nose daily. 03/30/12 06/16/16  Carmin Muskrat, MD  HYDROcodone-acetaminophen (NORCO/VICODIN) 5-325 MG per tablet Take 1-2 tablets by mouth every 6 (six) hours as needed for moderate pain. 04/01/13   Fareeha Evon, Jenny Reichmann, MD  LOSARTAN POTASSIUM PO Take by mouth.    [provider]  ondansetron (ZOFRAN ODT) 4 MG disintegrating tablet 4mg  ODT q4 hours prn nausea/vomit 02/19/16   Deno Etienne, DO  promethazine [facility])  25 MG tablet Take 1 tablet (25 mg total) by mouth every 6 (six) hours as needed for nausea or vomiting. 06/16/16   Orlie Dakin, MD    Allergies Latex and Morphine   REVIEW OF SYSTEMS  Negative except as noted here or in the History of Present Illness.   PHYSICAL EXAMINATION  Initial Vital Signs Blood pressure (!) 140/109, pulse 88, temperature 98.2 F (36.8 C), temperature source Oral, resp. rate (!) 24, height 6' (1.829 m), weight 86.2 kg (190 lb), SpO2 99 %.  Examination General: Well-developed, well-nourished female in no acute distress; appearance consistent with age of record HENT: normocephalic; atraumatic Eyes: pupils equal, round and reactive to light;  extraocular muscles intact Neck: supple Heart: regular rate and rhythm Lungs: clear to auscultation bilaterally Abdomen: soft; nondistended; right lower quadrant tenderness; bowel sounds present GU: Equivocal right CVA tenderness Extremities: No deformity; full range of motion; pulses normal Neurologic: Awake, alert and oriented; motor function intact in all extremities and symmetric; no facial droop Skin: Warm and dry; facial hirsutism Psychiatric: Flat affect   RESULTS  Summary of this visit's results, reviewed by myself:   EKG Interpretation  Date/Time:    Ventricular Rate:    PR Interval:    QRS Duration:   QT Interval:    QTC Calculation:   R Axis:     Text Interpretation:        Laboratory Studies: Results for orders placed or performed during the hospital encounter of 07/06/17 (from the past 24 hour(s))  Urinalysis, Routine w reflex microscopic     Status: Abnormal   Collection Time: 07/06/17 11:47 PM  Result Value Ref Range   Color, Urine YELLOW YELLOW   APPearance CLEAR CLEAR   Specific Gravity, Urine 1.025 1.005 - 1.030   pH 6.0 5.0 - 8.0   Glucose, UA >=500 (A) NEGATIVE mg/dL   Hgb urine dipstick SMALL (A) NEGATIVE   Bilirubin Urine NEGATIVE NEGATIVE   Ketones, ur 15 (A) NEGATIVE mg/dL   Protein, ur 30 (A) NEGATIVE mg/dL   Nitrite NEGATIVE NEGATIVE   Leukocytes, UA SMALL (A) NEGATIVE  Urinalysis, Microscopic (reflex)     Status: Abnormal   Collection Time: 07/06/17 11:47 PM  Result Value Ref Range   RBC / HPF 11-20 0 - 5 RBC/hpf   WBC, UA 11-20 0 - 5 WBC/hpf   Bacteria, UA RARE (A) NONE SEEN   Squamous Epithelial / LPF 0-5 0 - 5  CBC with Differential/Platelet     Status: None   Collection Time: 07/06/17 11:48 PM  Result Value Ref Range   WBC 9.9 4.0 - 10.5 K/uL   RBC 4.49 3.87 - 5.11 MIL/uL   Hemoglobin 13.5 12.0 - 15.0 g/dL   HCT 39.0 36.0 - 46.0 %   MCV 86.9 78.0 - 100.0 fL   MCH 30.1 26.0 - 34.0 pg   MCHC 34.6 30.0 - 36.0 g/dL   RDW 13.9  11.5 - 15.5 %   Platelets 287 150 - 400 K/uL   Neutrophils Relative % 72 %   Neutro Abs 7.1 1.7 - 7.7 K/uL   Lymphocytes Relative 21 %   Lymphs Abs 2.1 0.7 - 4.0 K/uL   Monocytes Relative 7 %   Monocytes Absolute 0.7 0.1 - 1.0 K/uL   Eosinophils Relative 0 %   Eosinophils Absolute 0.0 0.0 - 0.7 K/uL   Basophils Relative 0 %   Basophils Absolute 0.0 0.0 - 0.1 K/uL  Basic metabolic panel     Status:  Abnormal   Collection Time: 07/06/17 11:48 PM  Result Value Ref Range   Sodium 137 135 - 145 mmol/L   Potassium 4.4 3.5 - 5.1 mmol/L   Chloride 102 101 - 111 mmol/L   CO2 24 22 - 32 mmol/L   Glucose, Bld 142 (H) 65 - 99 mg/dL   BUN 41 (H) 6 - 20 mg/dL   Creatinine, Ser 1.41 (H) 0.44 - 1.00 mg/dL   Calcium 9.0 8.9 - 10.3 mg/dL   GFR calc non Af Amer 42 (L) >60 mL/min   GFR calc Af Amer 49 (L) >60 mL/min   Anion gap 11 5 - 15   Imaging Studies: No results found.  ED COURSE and MDM  Nursing notes and initial vitals signs, including pulse oximetry, reviewed.  Vitals:   07/07/17 0225 07/07/17 0230 07/07/17 0245 07/07/17 0300  BP: (!) 182/106 (!) 179/106 (!) 177/92 (!) 160/86  Pulse: 73 71 72 80  Resp:      Temp:      TempSrc:      SpO2: 98% 98% 98% 93%  Weight:      Height:       3:36 AM Patient still complaining of nausea despite 2 L of normal saline bolus, IV Zofran and IV Phenergan.  She was given ginger ale to drink which she states she vomited although this was not witnessed.  She was given Rocephin 1 g IV for treatment of her urinary tract infection.  We will have her admitted for continued hydration and treatment of her hyperemesis.  PROCEDURES    ED DIAGNOSES     ICD-10-CM   1. Nausea and vomiting in adult R11.2   2. Cystitis N30.90   3. Right flank pain R10.9        [name], Jenny Reichmann, MD 07/07/17 [phone]

## 2017-07-06 NOTE — ED Notes (Addendum)
Patient c/o right-sided flank pain and emesis X 3 days; denies any urinary sxs at present.

## 2017-07-06 NOTE — ED Triage Notes (Signed)
Pt n/v x 3 days-was seen at [facility] ED yesterday-dx with UTI  rx cipro, zofran, pepcid-pt states she feels no better and continues to vomit

## 2017-07-07 ENCOUNTER — Encounter (HOSPITAL_COMMUNITY): Payer: Self-pay | Admitting: Internal Medicine

## 2017-07-07 DIAGNOSIS — N183 Chronic kidney disease, stage 3 (moderate): Secondary | ICD-10-CM | POA: Diagnosis not present

## 2017-07-07 DIAGNOSIS — R109 Unspecified abdominal pain: Secondary | ICD-10-CM | POA: Diagnosis present

## 2017-07-07 DIAGNOSIS — I129 Hypertensive chronic kidney disease with stage 1 through stage 4 chronic kidney disease, or unspecified chronic kidney disease: Secondary | ICD-10-CM | POA: Diagnosis not present

## 2017-07-07 DIAGNOSIS — E785 Hyperlipidemia, unspecified: Secondary | ICD-10-CM | POA: Diagnosis not present

## 2017-07-07 DIAGNOSIS — Z765 Malingerer [conscious simulation]: Secondary | ICD-10-CM | POA: Diagnosis not present

## 2017-07-07 DIAGNOSIS — R112 Nausea with vomiting, unspecified: Secondary | ICD-10-CM | POA: Diagnosis present

## 2017-07-07 DIAGNOSIS — Z885 Allergy status to narcotic agent status: Secondary | ICD-10-CM | POA: Diagnosis not present

## 2017-07-07 DIAGNOSIS — N179 Acute kidney failure, unspecified: Secondary | ICD-10-CM | POA: Diagnosis not present

## 2017-07-07 DIAGNOSIS — E1140 Type 2 diabetes mellitus with diabetic neuropathy, unspecified: Secondary | ICD-10-CM | POA: Diagnosis not present

## 2017-07-07 DIAGNOSIS — E1122 Type 2 diabetes mellitus with diabetic chronic kidney disease: Secondary | ICD-10-CM | POA: Diagnosis not present

## 2017-07-07 DIAGNOSIS — K219 Gastro-esophageal reflux disease without esophagitis: Secondary | ICD-10-CM | POA: Diagnosis not present

## 2017-07-07 DIAGNOSIS — Z7982 Long term (current) use of aspirin: Secondary | ICD-10-CM | POA: Diagnosis not present

## 2017-07-07 DIAGNOSIS — I2510 Atherosclerotic heart disease of native coronary artery without angina pectoris: Secondary | ICD-10-CM

## 2017-07-07 DIAGNOSIS — Z888 Allergy status to other drugs, medicaments and biological substances status: Secondary | ICD-10-CM | POA: Diagnosis not present

## 2017-07-07 DIAGNOSIS — Z794 Long term (current) use of insulin: Secondary | ICD-10-CM | POA: Diagnosis not present

## 2017-07-07 DIAGNOSIS — N3000 Acute cystitis without hematuria: Secondary | ICD-10-CM

## 2017-07-07 DIAGNOSIS — K2970 Gastritis, unspecified, without bleeding: Secondary | ICD-10-CM | POA: Diagnosis not present

## 2017-07-07 DIAGNOSIS — Z79899 Other long term (current) drug therapy: Secondary | ICD-10-CM | POA: Diagnosis not present

## 2017-07-07 DIAGNOSIS — R1013 Epigastric pain: Secondary | ICD-10-CM | POA: Diagnosis not present

## 2017-07-07 DIAGNOSIS — Z91040 Latex allergy status: Secondary | ICD-10-CM | POA: Diagnosis not present

## 2017-07-07 DIAGNOSIS — Z8541 Personal history of malignant neoplasm of cervix uteri: Secondary | ICD-10-CM | POA: Diagnosis not present

## 2017-07-07 DIAGNOSIS — K5900 Constipation, unspecified: Secondary | ICD-10-CM | POA: Diagnosis not present

## 2017-07-07 HISTORY — DX: Atherosclerotic heart disease of native coronary artery without angina pectoris: I25.10

## 2017-07-07 HISTORY — DX: Acute cystitis without hematuria: N30.00

## 2017-07-07 HISTORY — DX: Nausea with vomiting, unspecified: R11.2

## 2017-07-07 LAB — URINALYSIS, ROUTINE W REFLEX MICROSCOPIC
Bilirubin Urine: NEGATIVE
Glucose, UA: >=500 mg/dL — AB
Ketones, ur: 15 mg/dL — AB
Nitrite: NEGATIVE
Protein, ur: 30 mg/dL — AB
SPECIFIC GRAVITY, URINE: 1.025 (ref 1.005–1.030)
pH: 6 (ref 5.0–8.0)

## 2017-07-07 LAB — CBC WITH DIFFERENTIAL/PLATELET
BASOS PCT: 0 %
Basophils Absolute: 0 10*3/uL (ref 0.0–0.1)
Eosinophils Absolute: 0 10*3/uL (ref 0.0–0.7)
Eosinophils Relative: 0 %
HEMATOCRIT: 39 % (ref 36.0–46.0)
HEMOGLOBIN: 13.5 g/dL (ref 12.0–15.0)
LYMPHS ABS: 2.1 10*3/uL (ref 0.7–4.0)
LYMPHS PCT: 21 %
MCH: 30.1 pg (ref 26.0–34.0)
MCHC: 34.6 g/dL (ref 30.0–36.0)
MCV: 86.9 fL (ref 78.0–100.0)
MONOS PCT: 7 %
Monocytes Absolute: 0.7 10*3/uL (ref 0.1–1.0)
NEUTROS ABS: 7.1 10*3/uL (ref 1.7–7.7)
Neutrophils Relative %: 72 %
Platelets: 287 10*3/uL (ref 150–400)
RBC: 4.49 MIL/uL (ref 3.87–5.11)
RDW: 13.9 % (ref 11.5–15.5)
WBC: 9.9 10*3/uL (ref 4.0–10.5)

## 2017-07-07 LAB — BASIC METABOLIC PANEL
Anion gap: 11 (ref 5–15)
BUN: 41 mg/dL — AB (ref 6–20)
CHLORIDE: 102 mmol/L (ref 101–111)
CO2: 24 mmol/L (ref 22–32)
Calcium: 9 mg/dL (ref 8.9–10.3)
Creatinine, Ser: 1.41 mg/dL — ABNORMAL HIGH (ref 0.44–1.00)
GFR calc non Af Amer: 42 mL/min — ABNORMAL LOW (ref >60)
GFR, EST AFRICAN AMERICAN: 49 mL/min — AB (ref >60)
Glucose, Bld: 142 mg/dL — ABNORMAL HIGH (ref 65–99)
POTASSIUM: 4.4 mmol/L (ref 3.5–5.1)
SODIUM: 137 mmol/L (ref 135–145)

## 2017-07-07 LAB — URINALYSIS, MICROSCOPIC (REFLEX)

## 2017-07-07 LAB — GLUCOSE, CAPILLARY
GLUCOSE-CAPILLARY: 90 mg/dL (ref 65–99)
GLUCOSE-CAPILLARY: 91 mg/dL (ref 65–99)
Glucose-Capillary: 107 mg/dL — ABNORMAL HIGH (ref 65–99)
Glucose-Capillary: 105 mg/dL — ABNORMAL HIGH (ref 65–99)

## 2017-07-07 LAB — CBG MONITORING, ED: Glucose-Capillary: 119 mg/dL — ABNORMAL HIGH (ref 65–99)

## 2017-07-07 MED ORDER — FLUTICASONE FUROATE-VILANTEROL 100-25 MCG/INH IN AEPB
1.0000 | INHALATION_SPRAY | Freq: Every day | RESPIRATORY_TRACT | Status: DC
  Administered 2017-07-07 – 2017-07-11 (×3): 1 via RESPIRATORY_TRACT
  Filled 2017-07-07: qty 28

## 2017-07-07 MED ORDER — SODIUM CHLORIDE 0.9 % IV SOLN
1.0000 g | INTRAVENOUS | Status: DC
  Administered 2017-07-07 – 2017-07-10 (×4): 1 g via INTRAVENOUS
  Filled 2017-07-07 (×3): qty 1
  Filled 2017-07-07: qty 10
  Filled 2017-07-07: qty 1

## 2017-07-07 MED ORDER — ATORVASTATIN CALCIUM 40 MG PO TABS
80.0000 mg | ORAL_TABLET | Freq: Every day | ORAL | Status: DC
  Administered 2017-07-08: 80 mg via ORAL
  Filled 2017-07-07 (×2): qty 2

## 2017-07-07 MED ORDER — ACETAMINOPHEN 325 MG PO TABS: 650.0000 mg | ORAL_TABLET | Freq: Four times a day (QID) | ORAL | Status: DC | PRN

## 2017-07-07 MED ORDER — OXYCODONE HCL 5 MG PO CAPS: 5.0000 mg | ORAL_CAPSULE | Freq: Four times a day (QID) | ORAL | Status: DC | PRN

## 2017-07-07 MED ORDER — FAMOTIDINE 20 MG PO TABS
20.0000 mg | ORAL_TABLET | Freq: Two times a day (BID) | ORAL | Status: DC
  Administered 2017-07-07 – 2017-07-08 (×2): 20 mg via ORAL
  Filled 2017-07-07 (×2): qty 1

## 2017-07-07 MED ORDER — HEPARIN SODIUM (PORCINE) 5000 UNIT/ML IJ SOLN
5000.0000 [IU] | Freq: Three times a day (TID) | INTRAMUSCULAR | Status: DC
  Administered 2017-07-07 – 2017-07-11 (×10): 5000 [IU] via SUBCUTANEOUS
  Filled 2017-07-07 (×10): qty 1

## 2017-07-07 MED ORDER — HYDRALAZINE HCL 20 MG/ML IJ SOLN
5.0000 mg | INTRAMUSCULAR | Status: DC | PRN
Start: 2017-07-07 — End: 2017-07-11
  Administered 2017-07-08 – 2017-07-09 (×3): 5 mg via INTRAVENOUS
  Filled 2017-07-07 (×3): qty 1

## 2017-07-07 MED ORDER — TRAMADOL HCL 50 MG PO TABS
50.0000 mg | ORAL_TABLET | Freq: Four times a day (QID) | ORAL | Status: DC | PRN
  Administered 2017-07-08 – 2017-07-11 (×2): 50 mg via ORAL
  Filled 2017-07-07 (×2): qty 1

## 2017-07-07 MED ORDER — ASPIRIN EC 81 MG PO TBEC
81.0000 mg | DELAYED_RELEASE_TABLET | Freq: Every day | ORAL | Status: DC
  Administered 2017-07-08: 81 mg via ORAL
  Filled 2017-07-07 (×2): qty 1

## 2017-07-07 MED ORDER — CARVEDILOL 25 MG PO TABS
25.0000 mg | ORAL_TABLET | Freq: Two times a day (BID) | ORAL | Status: DC
  Administered 2017-07-08 – 2017-07-11 (×7): 25 mg via ORAL
  Filled 2017-07-07 (×8): qty 1

## 2017-07-07 MED ORDER — ONDANSETRON HCL 4 MG/2ML IJ SOLN
4.0000 mg | Freq: Four times a day (QID) | INTRAMUSCULAR | Status: DC | PRN
  Administered 2017-07-07 – 2017-07-09 (×9): 4 mg via INTRAVENOUS
  Filled 2017-07-07 (×10): qty 2

## 2017-07-07 MED ORDER — ACETAMINOPHEN 650 MG RE SUPP: 650.0000 mg | Freq: Four times a day (QID) | RECTAL | Status: DC | PRN

## 2017-07-07 MED ORDER — INSULIN ASPART 100 UNIT/ML ~~LOC~~ SOLN: 0.0000 [IU] | SUBCUTANEOUS | Status: DC

## 2017-07-07 MED ORDER — GABAPENTIN 300 MG PO CAPS
300.0000 mg | ORAL_CAPSULE | Freq: Four times a day (QID) | ORAL | Status: DC | PRN
  Administered 2017-07-08: 300 mg via ORAL
  Filled 2017-07-07: qty 1

## 2017-07-07 MED ORDER — TIZANIDINE HCL 4 MG PO TABS
4.0000 mg | ORAL_TABLET | Freq: Four times a day (QID) | ORAL | Status: DC | PRN
Start: 2017-07-07 — End: 2017-07-11
  Administered 2017-07-11: 4 mg via ORAL
  Filled 2017-07-07: qty 1

## 2017-07-07 MED ORDER — SODIUM CHLORIDE 0.9 % IV BOLUS
1000.0000 mL | Freq: Once | INTRAVENOUS | Status: AC
  Administered 2017-07-07: 1000 mL via INTRAVENOUS

## 2017-07-07 MED ORDER — FENTANYL CITRATE (PF) 100 MCG/2ML IJ SOLN
INTRAMUSCULAR | Status: AC
  Filled 2017-07-07: qty 2

## 2017-07-07 MED ORDER — PROMETHAZINE HCL 25 MG/ML IJ SOLN
12.5000 mg | Freq: Four times a day (QID) | INTRAMUSCULAR | Status: DC | PRN
  Administered 2017-07-07 – 2017-07-09 (×7): 12.5 mg via INTRAVENOUS
  Filled 2017-07-07 (×7): qty 1

## 2017-07-07 MED ORDER — FENTANYL CITRATE (PF) 100 MCG/2ML IJ SOLN
50.0000 ug | INTRAMUSCULAR | Status: DC | PRN
  Administered 2017-07-07 – 2017-07-11 (×21): 50 ug via INTRAVENOUS
  Filled 2017-07-07 (×22): qty 2

## 2017-07-07 MED ORDER — ONDANSETRON HCL 4 MG PO TABS: 4.0000 mg | ORAL_TABLET | Freq: Four times a day (QID) | ORAL | Status: DC | PRN

## 2017-07-07 MED ORDER — SPIRONOLACTONE 25 MG PO TABS
25.0000 mg | ORAL_TABLET | Freq: Every day | ORAL | Status: DC
  Administered 2017-07-08: 25 mg via ORAL
  Filled 2017-07-07: qty 1

## 2017-07-07 MED ORDER — CARVEDILOL 12.5 MG PO TABS
12.5000 mg | ORAL_TABLET | Freq: Two times a day (BID) | ORAL | Status: DC
  Filled 2017-07-07: qty 1

## 2017-07-07 MED ORDER — PROMETHAZINE HCL 25 MG/ML IJ SOLN
12.5000 mg | Freq: Once | INTRAMUSCULAR | Status: AC
Start: 2017-07-07 — End: 2017-07-07
  Administered 2017-07-07: 12.5 mg via INTRAVENOUS
  Filled 2017-07-07: qty 1

## 2017-07-07 MED ORDER — GABAPENTIN 300 MG PO CAPS
300.0000 mg | ORAL_CAPSULE | Freq: Three times a day (TID) | ORAL | Status: DC
  Filled 2017-07-07: qty 1

## 2017-07-07 MED ORDER — SODIUM CHLORIDE 0.9 % IV SOLN
1.0000 g | Freq: Once | INTRAVENOUS | Status: AC
  Administered 2017-07-07: 1 g via INTRAVENOUS
  Filled 2017-07-07: qty 10

## 2017-07-07 MED ORDER — SODIUM CHLORIDE 0.9 % IV SOLN
INTRAVENOUS | Status: DC
  Administered 2017-07-07 – 2017-07-11 (×6): via INTRAVENOUS

## 2017-07-07 NOTE — ED Notes (Signed)
Ginger ale provided; patient reports nausea is improved but still present.

## 2017-07-07 NOTE — ED Notes (Signed)
Patient ambulated to restroom; steady gait observed. 

## 2017-07-07 NOTE — H&P (Signed)
History and Physical    COLINDA BARTH XLK:[id number] DOB: January 06, 1966 DOA: 07/06/2017  PCP: Marjory Sneddon, MD  Patient coming from: [facility]   Chief Complaint: Nausea, vomiting, abdominal pain, flank pain   HPI: [name] is a 52 y.o. female with medical history significant of coronary artery disease, diabetes mellitus, neuropathy, hyperlipidemia who presents with 3-day history of nausea, vomiting, epigastric abdominal pain, right-sided flank pain.  She was evaluated at [facility] yesterday for same complaints, was diagnosed with urinary tract infection.  She was discharged home on Cipro, Zantac and Zofran.  She wah not been able to keep her medications down due to nausea and vomiting.  She presented to [facility] last night with same complaints.  She admits to chills, feeling hot and cold, denies any chest pain or shortness of breath or cough, denies any dysuria.  ED Course: Labs obtained which revealed acute kidney injury with creatinine 1.4.  CT abdomen pelvis revealed enteritis as well as cystitis.  UA was obtained.  She was started on IV  Rocephin and transferred to [facility] for observation.  Review of Systems: As per HPI otherwise 10 point review of systems negative.   Past Medical History:  Diagnosis Date  . Cervical cancer ([place])   . Coronary artery disease    30% lesions noted  . Depression   . Diabetes mellitus   . GERD (gastroesophageal reflux disease)   . Neuropathy     Past Surgical History:  Procedure Laterality Date  . ABDOMINAL HYSTERECTOMY     partial  . CHOLECYSTECTOMY       reports that she has never smoked. She has never used smokeless tobacco. She reports that she drinks alcohol. She reports that she does not use drugs.  Allergies  Allergen Reactions  . Latex   . Morphine     Family History  Problem Relation Age of Onset  . Diabetes Mother   . Diabetes Father     Prior to Admission medications   Medication  Sig Start Date End Date Taking? Authorizing Provider  aspirin 81 MG tablet Take 81 mg by mouth daily.   Yes [provider]  atorvastatin (LIPITOR) 80 MG tablet Take 80 mg by mouth daily.   Yes [provider]  carvedilol (COREG) 12.5 MG tablet Take 12.5 mg by mouth 2 (two) times daily with a meal.   Yes [provider]  gabapentin (NEURONTIN) 300 MG capsule Take 300 mg by mouth 3 (three) times daily.   Yes [provider]  insulin degludec (TRESIBA FLEXTOUCH) 100 UNIT/ML SOPN FlexTouch Pen Inject 40 Units into the skin daily at 10 pm.   Yes [provider]  oxycodone (OXY-IR) 5 MG capsule Take 5 mg by mouth every 6 (six) hours as needed.   Yes [provider]  tiZANidine (ZANAFLEX) 4 MG tablet Take 4 mg by mouth every 6 (six) hours as needed for muscle spasms.   Yes [provider]  amoxicillin (AMOXIL) 500 MG capsule Take 2 capsules (1,000 mg total) by mouth 2 (two) times daily. 02/19/16   Deno Etienne, DO  fluticasone (FLONASE) 50 MCG/ACT nasal spray Place 2 sprays into the nose daily. 03/30/12 06/16/16  Carmin Muskrat, MD  HYDROcodone-acetaminophen (NORCO/VICODIN) 5-325 MG per tablet Take 1-2 tablets by mouth every 6 (six) hours as needed for moderate pain. 04/01/13   Molpus, Jenny Reichmann, MD  LOSARTAN POTASSIUM PO Take by mouth.    [provider]  ondansetron (  ZOFRAN ODT) 4 MG disintegrating tablet 4mg  ODT q4 hours prn nausea/vomit 02/19/16   Deno Etienne, DO  promethazine (PHENERGAN) 25 MG tablet Take 1 tablet (25 mg total) by mouth every 6 (six) hours as needed for nausea or vomiting. 06/16/16   Orlie Dakin, MD    Physical Exam: Vitals:   07/07/17 0445 07/07/17 0610 07/07/17 0802 07/07/17 0903  BP: (!) 178/101 (!) 145/107 (!) 139/92 (!) 165/92  Pulse: 66 77 69 68  Resp:   18 12  Temp:   98.4 F (36.9 C) 98.3 F (36.8 C)  TempSrc:   Oral Oral  SpO2: 98% 98% 98% 99%  Weight:      Height:        Constitutional: NAD, calm,  mildly uncomfortable appearing  Eyes: PERRL, lids and conjunctivae normal ENMT: Mucous membranes are moist. Posterior pharynx clear of any exudate or lesions.Normal dentition.  Neck: normal, supple, no masses, no thyromegaly Respiratory: clear to auscultation bilaterally, no wheezing, no crackles. Normal respiratory effort. No accessory muscle use.  Cardiovascular: Regular rate and rhythm, no murmurs / rubs / gallops. No extremity edema.  Abdomen: TTP epigastric, no masses palpated. No hepatosplenomegaly. Bowel sounds positive.  Musculoskeletal: no clubbing / cyanosis. No joint deformity upper and lower extremities. Good ROM, no contractures. Normal muscle tone.  Skin: no rashes, lesions, ulcers on exposed skin.  Neurologic: CN 2-12 grossly intact. Strength 5/5 in all 4.  Psychiatric: Normal judgment and insight. Alert and oriented x 3. Normal mood.   Labs on Admission: I have personally reviewed following labs and imaging studies  CBC: Recent Labs  Lab 07/06/17 2348  WBC 9.9  NEUTROABS 7.1  HGB 13.5  HCT 39.0  MCV 86.9  PLT 510   Basic Metabolic Panel: Recent Labs  Lab 07/06/17 2348  NA 137  K 4.4  CL 102  CO2 24  GLUCOSE 142*  BUN 41*  CREATININE 1.41*  CALCIUM 9.0   GFR: Estimated Creatinine Clearance: 53.9 mL/min (A) (by C-G formula based on SCr of 1.41 mg/dL (H)). Liver Function Tests: No results for input(s): AST, ALT, ALKPHOS, BILITOT, PROT, ALBUMIN in the last 168 hours. No results for input(s): LIPASE, AMYLASE in the last 168 hours. No results for input(s): AMMONIA in the last 168 hours. Coagulation Profile: No results for input(s): INR, PROTIME in the last 168 hours. Cardiac Enzymes: No results for input(s): CKTOTAL, CKMB, CKMBINDEX, TROPONINI in the last 168 hours. BNP (last 3 results) No results for input(s): PROBNP in the last 8760 hours. HbA1C: No results for input(s): HGBA1C in the last 72 hours. CBG: Recent Labs  Lab 07/07/17 0810  GLUCAP 119*     Lipid Profile: No results for input(s): CHOL, HDL, LDLCALC, TRIG, CHOLHDL, LDLDIRECT in the last 72 hours. Thyroid Function Tests: No results for input(s): TSH, T4TOTAL, FREET4, T3FREE, THYROIDAB in the last 72 hours. Anemia Panel: No results for input(s): VITAMINB12, FOLATE, FERRITIN, TIBC, IRON, RETICCTPCT in the last 72 hours. Urine analysis:    Component Value Date/Time   COLORURINE YELLOW 07/06/2017 2347   APPEARANCEUR CLEAR 07/06/2017 2347   LABSPEC 1.025 07/06/2017 2347   PHURINE 6.0 07/06/2017 2347   GLUCOSEU >=500 (A) 07/06/2017 2347   HGBUR SMALL (A) 07/06/2017 2347   HGBUR negative 01/08/2010 1045   BILIRUBINUR NEGATIVE 07/06/2017 2347   KETONESUR 15 (A) 07/06/2017 2347   PROTEINUR 30 (A) 07/06/2017 2347   UROBILINOGEN 1.0 11/14/2013 0741   NITRITE NEGATIVE 07/06/2017 2347   LEUKOCYTESUR SMALL (A) 07/06/2017 2347  Sepsis Labs: !!!!!!!!!!!!!!!!!!!!!!!!!!!!!!!!!!!!!!!!!!!! @LABRCNTIP (procalcitonin:4,lacticidven:4) )No results found for this or any previous visit (from the past 240 hour(s)).   Radiological Exams on Admission: No results found.  Assessment/Plan Principal Problem:   Nausea & vomiting Active Problems:   Type 2 diabetes mellitus with stage 3 chronic kidney disease (HCC)   HLD (hyperlipidemia)   Essential hypertension   Acute cystitis   CAD (coronary artery disease)  Nausea and vomiting secondary to enteritis -IVF and supportive care -Antiemetics  -Clear liquid diet and advance as tolerated   Acute cystitis  -Urine culture pending  -Rocephin  CKD stage 3 -Baseline Cr 1.3-1.4 -Hold home losartan for now until oral intake adequate and Cr remains stable   DM with neuropathy and CKD  -SSI and continue Neurontin for neuropathy   HLD -Continue lipitor   CAD -Continue aspirin   HTN -Hold home losartan. Continue coreg   DVT prophylaxis: Subq hep Code Status: Full  Family Communication: No family at bedside  Disposition Plan: Expect  clinical improvement and return to home setting on discharge Consults called: None   Admission status: Observation   Severity of Illness: The appropriate patient status for this patient is OBSERVATION. Observation status is judged to be reasonable and necessary in order to provide the required intensity of service to ensure the patient's safety. The patient's presenting symptoms, physical exam findings, and initial radiographic and laboratory data in the context of their medical condition is felt to place them at decreased risk for further clinical deterioration. Furthermore, it is anticipated that the patient will be medically stable for discharge from the hospital within 2 midnights of admission.   Dessa Phi, DO [facility] [email or web address] Password [email or web address] 07/07/2017, 9:22 AM

## 2017-07-07 NOTE — ED Notes (Signed)
ED Provider at bedside. 

## 2017-07-08 ENCOUNTER — Inpatient Hospital Stay (HOSPITAL_COMMUNITY): Payer: 59

## 2017-07-08 ENCOUNTER — Encounter (HOSPITAL_COMMUNITY): Payer: Self-pay | Admitting: Radiology

## 2017-07-08 DIAGNOSIS — K219 Gastro-esophageal reflux disease without esophagitis: Secondary | ICD-10-CM | POA: Diagnosis present

## 2017-07-08 DIAGNOSIS — N183 Chronic kidney disease, stage 3 (moderate): Secondary | ICD-10-CM

## 2017-07-08 DIAGNOSIS — I10 Essential (primary) hypertension: Secondary | ICD-10-CM

## 2017-07-08 DIAGNOSIS — N179 Acute kidney failure, unspecified: Secondary | ICD-10-CM | POA: Diagnosis present

## 2017-07-08 DIAGNOSIS — R109 Unspecified abdominal pain: Secondary | ICD-10-CM | POA: Diagnosis present

## 2017-07-08 DIAGNOSIS — N3000 Acute cystitis without hematuria: Secondary | ICD-10-CM | POA: Diagnosis present

## 2017-07-08 DIAGNOSIS — E1140 Type 2 diabetes mellitus with diabetic neuropathy, unspecified: Secondary | ICD-10-CM | POA: Diagnosis present

## 2017-07-08 DIAGNOSIS — Z79899 Other long term (current) drug therapy: Secondary | ICD-10-CM | POA: Diagnosis not present

## 2017-07-08 DIAGNOSIS — I129 Hypertensive chronic kidney disease with stage 1 through stage 4 chronic kidney disease, or unspecified chronic kidney disease: Secondary | ICD-10-CM | POA: Diagnosis present

## 2017-07-08 DIAGNOSIS — K2970 Gastritis, unspecified, without bleeding: Secondary | ICD-10-CM | POA: Diagnosis not present

## 2017-07-08 DIAGNOSIS — E1122 Type 2 diabetes mellitus with diabetic chronic kidney disease: Secondary | ICD-10-CM | POA: Diagnosis present

## 2017-07-08 DIAGNOSIS — Z794 Long term (current) use of insulin: Secondary | ICD-10-CM

## 2017-07-08 DIAGNOSIS — Z7982 Long term (current) use of aspirin: Secondary | ICD-10-CM | POA: Diagnosis not present

## 2017-07-08 DIAGNOSIS — R112 Nausea with vomiting, unspecified: Secondary | ICD-10-CM

## 2017-07-08 DIAGNOSIS — K5900 Constipation, unspecified: Secondary | ICD-10-CM | POA: Diagnosis present

## 2017-07-08 DIAGNOSIS — Z885 Allergy status to narcotic agent status: Secondary | ICD-10-CM | POA: Diagnosis not present

## 2017-07-08 DIAGNOSIS — Z8541 Personal history of malignant neoplasm of cervix uteri: Secondary | ICD-10-CM | POA: Diagnosis not present

## 2017-07-08 DIAGNOSIS — I2510 Atherosclerotic heart disease of native coronary artery without angina pectoris: Secondary | ICD-10-CM | POA: Diagnosis present

## 2017-07-08 DIAGNOSIS — Z91040 Latex allergy status: Secondary | ICD-10-CM | POA: Diagnosis not present

## 2017-07-08 DIAGNOSIS — Z888 Allergy status to other drugs, medicaments and biological substances status: Secondary | ICD-10-CM | POA: Diagnosis not present

## 2017-07-08 DIAGNOSIS — R1013 Epigastric pain: Secondary | ICD-10-CM | POA: Diagnosis present

## 2017-07-08 DIAGNOSIS — Z765 Malingerer [conscious simulation]: Secondary | ICD-10-CM | POA: Diagnosis not present

## 2017-07-08 DIAGNOSIS — E785 Hyperlipidemia, unspecified: Secondary | ICD-10-CM | POA: Diagnosis present

## 2017-07-08 LAB — BASIC METABOLIC PANEL
ANION GAP: 9 (ref 5–15)
BUN: 29 mg/dL — ABNORMAL HIGH (ref 6–20)
CO2: 23 mmol/L (ref 22–32)
Calcium: 8.6 mg/dL — ABNORMAL LOW (ref 8.9–10.3)
Chloride: 106 mmol/L (ref 101–111)
Creatinine, Ser: 1.09 mg/dL — ABNORMAL HIGH (ref 0.44–1.00)
GFR, EST NON AFRICAN AMERICAN: 57 mL/min — AB (ref >60)
GLUCOSE: 96 mg/dL (ref 65–99)
POTASSIUM: 3.6 mmol/L (ref 3.5–5.1)
Sodium: 138 mmol/L (ref 135–145)

## 2017-07-08 LAB — CBC
HEMATOCRIT: 39.8 % (ref 36.0–46.0)
Hemoglobin: 13.1 g/dL (ref 12.0–15.0)
MCH: 29.2 pg (ref 26.0–34.0)
MCHC: 32.9 g/dL (ref 30.0–36.0)
MCV: 88.6 fL (ref 78.0–100.0)
Platelets: 294 10*3/uL (ref 150–400)
RBC: 4.49 MIL/uL (ref 3.87–5.11)
RDW: 13.5 % (ref 11.5–15.5)
WBC: 7.1 10*3/uL (ref 4.0–10.5)

## 2017-07-08 LAB — URINE CULTURE: Culture: NO GROWTH

## 2017-07-08 LAB — GLUCOSE, CAPILLARY
GLUCOSE-CAPILLARY: 82 mg/dL (ref 65–99)
GLUCOSE-CAPILLARY: 229 mg/dL — AB (ref 65–99)
Glucose-Capillary: 105 mg/dL — ABNORMAL HIGH (ref 65–99)
Glucose-Capillary: 105 mg/dL — ABNORMAL HIGH (ref 65–99)
Glucose-Capillary: 169 mg/dL — ABNORMAL HIGH (ref 65–99)
Glucose-Capillary: 92 mg/dL (ref 65–99)

## 2017-07-08 LAB — HIV ANTIBODY (ROUTINE TESTING W REFLEX): HIV SCREEN 4TH GENERATION: NONREACTIVE

## 2017-07-08 MED ORDER — FAMOTIDINE IN NACL 20-0.9 MG/50ML-% IV SOLN
20.0000 mg | Freq: Two times a day (BID) | INTRAVENOUS | Status: DC
Start: 2017-07-08 — End: 2017-07-11
  Administered 2017-07-08 – 2017-07-10 (×5): 20 mg via INTRAVENOUS
  Filled 2017-07-08 (×5): qty 50

## 2017-07-08 MED ORDER — IOPAMIDOL (ISOVUE-300) INJECTION 61%: 30.0000 mL | Freq: Once | INTRAVENOUS | Status: DC

## 2017-07-08 MED ORDER — LIP MEDEX EX OINT
TOPICAL_OINTMENT | CUTANEOUS | Status: DC | PRN
  Filled 2017-07-08: qty 7

## 2017-07-08 MED ORDER — IOPAMIDOL (ISOVUE-300) INJECTION 61%
INTRAVENOUS | Status: AC
  Filled 2017-07-08: qty 30

## 2017-07-08 MED ORDER — IOPAMIDOL (ISOVUE-300) INJECTION 61%
INTRAVENOUS | Status: AC
  Filled 2017-07-08: qty 100

## 2017-07-08 MED ORDER — POTASSIUM CHLORIDE 10 MEQ/100ML IV SOLN
10.0000 meq | INTRAVENOUS | Status: AC
  Administered 2017-07-08 (×2): 10 meq via INTRAVENOUS
  Filled 2017-07-08 (×2): qty 100

## 2017-07-08 MED ORDER — IOPAMIDOL (ISOVUE-300) INJECTION 61%
100.0000 mL | Freq: Once | INTRAVENOUS | Status: AC | PRN
  Administered 2017-07-08: 100 mL via INTRAVENOUS

## 2017-07-08 MED ORDER — SODIUM CHLORIDE 0.9 % IV BOLUS
250.0000 mL | Freq: Once | INTRAVENOUS | Status: AC
  Administered 2017-07-08: 250 mL via INTRAVENOUS

## 2017-07-08 NOTE — Progress Notes (Signed)
PROGRESS NOTE [facility]   [name]   JOA:[id number] DOB: 1965/11/13  DOA: 07/06/2017 PCP: Marjory Sneddon, MD   Brief Narrative:  [name] is a 52 year old female with medical history significant for CAD, diabetes mellitus, neuropathy and hyperlipidemia.  Patient presented to the emergency department complaining of nausea, vomiting, abdominal and flank pain.  3 days prior to admission she was evaluated at [facility] where she was diagnosed with UTI and was discharged on ciprofloxacin, however patient was unable to keep down her medication due to nausea and vomiting.  Upon ED evaluation at [facility] patient was found to have increasing creatinine to 1.4.  She was admitted for UTI complicated with AKI, failure outpatient therapy trial.  Of note CT abdomen[facility]pelvis on 5/26 from [facility], consistent with cystitis however negative for enteritis.  Scan that showed enteritis is from 2018.  Subjective: Patient seen and examined, continues with nausea and vomiting.  Report flank and lower abdominal pain. Afebrile.  No acute events overnight.  Assessment & Plan: Abdominal pain, nausea and vomiting Suspect pyelonephritis after partially treated UTI. Continue supportive care with antiemetics and IV fluid Bowel rest, n.p.o. Hold oral medication   Acute cystitis,?  Pyelonephritis Urinalysis on 5/26 at outside facilities consistent with urinary tract infection, however urine cultures were not obtained.  In-house urine culture negative,  likely masked after antibiotic use. Will continue Rocephin for now Obtain CT abdomen and pelvis with contrast  AKI on CKD stage III Continue IV fluid Creatinine improving Check renal function in a.m.  DM type II Hold Tresiba while n.p.o. Continue SSI Monitor CBGs and check A1c  Hypertension BP slight elevated Continue Coreg and hydralazine as needed Monitor BP closely  DVT  prophylaxis: Heparin subq Code Status: Full code Family Communication: None at bedside Disposition Plan: Home when able to tolerate oral diet  Consultants:   None  Procedures:   None  Antimicrobials:  Rocephin 5/28   Objective: Vitals:   07/07/17 1419 07/07/17 2103 07/08/17 0359 07/08/17 0627  BP: (!) 168/104 (!) 156/90 (!) 170/114 (!) 148/84  Pulse: 70 71 74 75  Resp: 16 16 20    Temp: 98.6 F (37 C) 98.5 F (36.9 C) 98.9 F (37.2 C)   TempSrc: Oral Oral Oral   SpO2: 100% 97% 100%   Weight:      Height:        Intake/Output Summary (Last 24 hours) at 07/08/2017 1349 Last data filed at 07/08/2017 1149 Gross per 24 hour  Intake 2673.34 ml  Output -  Net 2673.34 ml   Filed Weights   07/06/17 2151  Weight: 86.2 kg (190 lb)    Examination:  General exam: Appears calm and comfortable  HEENT: AC/AT, PERRLA, OP moist and clear Respiratory system: Clear to auscultation. No wheezes,crackle or rhonchi Cardiovascular system: S1 & S2 heard, RRR. No JVD, murmurs, rubs or gallops Gastrointestinal system: Abdomen soft, slightly distended, diffuse tenderness, no guarding or rebound.  No organomegaly.  Positive CVA tenderness. Central nervous system: Alert and oriented. No focal neurological deficits. Extremities: No pedal edema Skin: No rashes, lesions or ulcers Psychiatry: Mood & affect appropriate.    Data Reviewed: I have personally reviewed following labs and imaging studies  CBC: Recent Labs  Lab 07/06/17 2348 07/08/17 0556  WBC 9.9 7.1  NEUTROABS 7.1  --   HGB 13.5 13.1  HCT 39.0 39.8  MCV 86.9 88.6  PLT 287 601   Basic Metabolic Panel:  Recent Labs  Lab 07/06/17 2348 07/08/17 0556  NA 137 138  K 4.4 3.6  CL 102 106  CO2 24 23  GLUCOSE 142* 96  BUN 41* 29*  CREATININE 1.41* 1.09*  CALCIUM 9.0 8.6*   GFR: Estimated Creatinine Clearance: 69.7 mL/min (A) (by C-G formula based on SCr of 1.09 mg/dL (H)). Liver Function Tests: No results for  input(s): AST, ALT, ALKPHOS, BILITOT, PROT, ALBUMIN in the last 168 hours. No results for input(s): LIPASE, AMYLASE in the last 168 hours. No results for input(s): AMMONIA in the last 168 hours. Coagulation Profile: No results for input(s): INR, PROTIME in the last 168 hours. Cardiac Enzymes: No results for input(s): CKTOTAL, CKMB, CKMBINDEX, TROPONINI in the last 168 hours. BNP (last 3 results) No results for input(s): PROBNP in the last 8760 hours. HbA1C: No results for input(s): HGBA1C in the last 72 hours. CBG: Recent Labs  Lab 07/07/17 2100 07/07/17 2347 07/08/17 0358 07/08/17 0726 07/08/17 1146  GLUCAP 105* 90 92 82 105*   Lipid Profile: No results for input(s): CHOL, HDL, LDLCALC, TRIG, CHOLHDL, LDLDIRECT in the last 72 hours. Thyroid Function Tests: No results for input(s): TSH, T4TOTAL, FREET4, T3FREE, THYROIDAB in the last 72 hours. Anemia Panel: No results for input(s): VITAMINB12, FOLATE, FERRITIN, TIBC, IRON, RETICCTPCT in the last 72 hours. Sepsis Labs: No results for input(s): PROCALCITON, LATICACIDVEN in the last 168 hours.  Recent Results (from the past 240 hour(s))  Urine culture     Status: None   Collection Time: 07/06/17 11:47 PM  Result Value Ref Range Status   Specimen Description   Final    URINE, RANDOM Performed at [facility], [address] [place], [place] [address]    Special Requests   Final    NONE Performed at [facility], [address] [place], [place] [address]    Culture   Final    NO GROWTH Performed at [facility], [place] [address], [place], [place] [address]    Report Status 07/08/2017 FINAL  Final      Radiology Studies: No results found.    Scheduled Meds: . carvedilol  25 mg Oral BID WC  . fluticasone furoate-vilanterol  1 puff Inhalation Daily  . heparin  5,000 Units Subcutaneous Q8H  . insulin aspart  0-9 Units Subcutaneous Q4H  . iopamidol  30 mL Oral Once  . iopamidol        Continuous Infusions: . sodium chloride 100 mL/hr at 07/07/17 2003  . cefTRIAXone (ROCEPHIN)  IV Stopped (07/07/17 2230)  . famotidine (PEPCID) IV Stopped (07/08/17 1122)     LOS: 0 days    Time spent: Total of 35 minutes spent with pt, greater than 50% of which was spent in discussion of  treatment, counseling and coordination of care   Chipper Oman, MD Pager: Text Page via [email or web address]   If 7PM-7AM, please contact night-coverage [email or web address] 07/08/2017, 1:49 PM   Note - This record has been created using [employer]. Chart creation errors have been sought, but may not always have been located. Such creation errors do not reflect on the standard of medical care.

## 2017-07-09 DIAGNOSIS — R109 Unspecified abdominal pain: Secondary | ICD-10-CM

## 2017-07-09 LAB — BASIC METABOLIC PANEL
ANION GAP: 9 (ref 5–15)
BUN: 18 mg/dL (ref 6–20)
CHLORIDE: 104 mmol/L (ref 101–111)
CO2: 22 mmol/L (ref 22–32)
Calcium: 8.5 mg/dL — ABNORMAL LOW (ref 8.9–10.3)
Creatinine, Ser: 1.04 mg/dL — ABNORMAL HIGH (ref 0.44–1.00)
GFR calc non Af Amer: >60 mL/min (ref >60)
GLUCOSE: 126 mg/dL — AB (ref 65–99)
POTASSIUM: 3.5 mmol/L (ref 3.5–5.1)
Sodium: 135 mmol/L (ref 135–145)

## 2017-07-09 LAB — CBC WITH DIFFERENTIAL/PLATELET
Basophils Absolute: 0 10*3/uL (ref 0.0–0.1)
Basophils Relative: 0 %
EOS ABS: 0.1 10*3/uL (ref 0.0–0.7)
Eosinophils Relative: 1 %
HEMATOCRIT: 39.2 % (ref 36.0–46.0)
HEMOGLOBIN: 13.3 g/dL (ref 12.0–15.0)
LYMPHS PCT: 37 %
Lymphs Abs: 2.3 10*3/uL (ref 0.7–4.0)
MCH: 29.8 pg (ref 26.0–34.0)
MCHC: 33.9 g/dL (ref 30.0–36.0)
MCV: 87.9 fL (ref 78.0–100.0)
MONOS PCT: 10 %
Monocytes Absolute: 0.6 10*3/uL (ref 0.1–1.0)
NEUTROS PCT: 52 %
Neutro Abs: 3.3 10*3/uL (ref 1.7–7.7)
Platelets: 255 10*3/uL (ref 150–400)
RBC: 4.46 MIL/uL (ref 3.87–5.11)
RDW: 13.2 % (ref 11.5–15.5)
WBC: 6.3 10*3/uL (ref 4.0–10.5)

## 2017-07-09 LAB — GLUCOSE, CAPILLARY
GLUCOSE-CAPILLARY: 136 mg/dL — AB (ref 65–99)
GLUCOSE-CAPILLARY: 144 mg/dL — AB (ref 65–99)
Glucose-Capillary: 152 mg/dL — ABNORMAL HIGH (ref 65–99)
Glucose-Capillary: 122 mg/dL — ABNORMAL HIGH (ref 65–99)
Glucose-Capillary: 147 mg/dL — ABNORMAL HIGH (ref 65–99)

## 2017-07-09 LAB — HEMOGLOBIN A1C
Hgb A1c MFr Bld: 7.7 % — ABNORMAL HIGH (ref 4.8–5.6)
Mean Plasma Glucose: 174.29 mg/dL

## 2017-07-09 MED ORDER — POLYETHYLENE GLYCOL 3350 17 G PO PACK
17.0000 g | PACK | Freq: Every day | ORAL | Status: DC
  Administered 2017-07-09 – 2017-07-11 (×2): 17 g via ORAL
  Filled 2017-07-09 (×2): qty 1

## 2017-07-09 MED ORDER — BISACODYL 5 MG PO TBEC
10.0000 mg | DELAYED_RELEASE_TABLET | Freq: Once | ORAL | Status: AC
  Administered 2017-07-09: 10 mg via ORAL
  Filled 2017-07-09: qty 2

## 2017-07-09 MED ORDER — METOCLOPRAMIDE HCL 5 MG/ML IJ SOLN
10.0000 mg | Freq: Three times a day (TID) | INTRAMUSCULAR | Status: DC
  Administered 2017-07-09 – 2017-07-11 (×6): 10 mg via INTRAVENOUS
  Filled 2017-07-09 (×6): qty 2

## 2017-07-09 NOTE — Progress Notes (Signed)
PROGRESS NOTE [facility]   [name]   WFU:[id number] DOB: 03/02/1965  DOA: 07/06/2017 PCP: Marjory Sneddon, MD   Brief Narrative:  [name] is a 52 year old female with medical history significant for CAD, diabetes mellitus, neuropathy and hyperlipidemia.  Patient presented to the emergency department complaining of nausea, vomiting, abdominal and flank pain.  3 days prior to admission she was evaluated at [facility] where she was diagnosed with UTI and was discharged on ciprofloxacin, however patient was unable to keep down her medication due to nausea and vomiting.  Upon ED evaluation at [facility] patient was found to have increasing creatinine to 1.4.  She was admitted for UTI complicated with AKI, failure outpatient therapy trial.  Of note CT abdomen/pelvis on 5/26 from [facility], consistent with cystitis however negative for enteritis.  Scan that showed enteritis is from 2018.  Subjective: She is seen and examined, continues to complain of abdominal pain with nausea and vomiting.  Wanted to try clear liquid diet however did not tolerate.  Assessment & Plan: Abdominal pain, nausea and vomiting Unclear etiology at this time, CT abdomen and pelvis shows no acute abnormality. ?  Gastroparesis, peptic ulcer. Continue supportive care with antiemetics and IV fluid.  Keep n.p.o. Will start Reglan, 3 times daily.  Continue Pepcid IV twice daily. I have suspicious of drug-seeking as patient was very comfortable on my evaluation.  Patient requesting fentanyl and Phenergan around-the-clock.  Acute cystitis Urinalysis on 5/26 at outside facilities consistent with urinary tract infection, however urine cultures were not obtained.  In-house urine culture negative,  likely masked after antibiotic use. No signs of pyelonephritis on CT abdomen/pelvis.  Continue Rocephin for now  AKI on CKD stage III Continue IV fluid Creatinine  improved, almost at baseline Continue to monitor renal function  DM type II Hold Tresiba while n.p.o. Continue SSI A1c 7.7  Hypertension BP stable Continue Coreg and hydralazine as needed Monitor BP closely  DVT prophylaxis: Heparin subq Code Status: Full code Family Communication: None at bedside Disposition Plan: To be determined  Consultants:   None  Procedures:   None  Antimicrobials:  Rocephin 5/28   Objective: Vitals:   07/08/17 2020 07/09/17 0505 07/09/17 0626 07/09/17 1414  BP: 112/65 (!) 161/100 132/83 (!) 147/86  Pulse: 71 72 81 78  Resp: 18 17  16   Temp: 98.3 F (36.8 C) 98.5 F (36.9 C)  99.1 F (37.3 C)  TempSrc: Oral Oral  Oral  SpO2: 100% 100%  100%  Weight:      Height:        Intake/Output Summary (Last 24 hours) at 07/09/2017 1614 Last data filed at 07/09/2017 1400 Gross per 24 hour  Intake 2797.5 ml  Output -  Net 2797.5 ml   Filed Weights   07/06/17 2151  Weight: 86.2 kg (190 lb)    Examination:  General exam: NAD HEENT: OP moist and clear Respiratory system: CTA, no wheezing or crackles Cardiovascular system: S1-S2, RRR Gastrointestinal system: Abdomen soft, mild epigastric tenderness.  Positive bowel sounds, no guarding or rebound Central nervous system: Alert and oriented x3 Extremities: No LE edema Skin: No rash or lesion Psychiatry: Mood and affect flat   Data Reviewed: I have personally reviewed following labs and imaging studies  CBC: Recent Labs  Lab 07/06/17 2348 07/08/17 0556 07/09/17 0909  WBC 9.9 7.1 6.3  NEUTROABS 7.1  --  3.3  HGB 13.5 13.1 13.3  HCT 39.0  39.8 39.2  MCV 86.9 88.6 87.9  PLT 287 294 161   Basic Metabolic Panel: Recent Labs  Lab 07/06/17 2348 07/08/17 0556 07/09/17 0909  NA 137 138 135  K 4.4 3.6 3.5  CL 102 106 104  CO2 24 23 22   GLUCOSE 142* 96 126*  BUN 41* 29* 18  CREATININE 1.41* 1.09* 1.04*  CALCIUM 9.0 8.6* 8.5*   GFR: Estimated Creatinine Clearance: 73 mL/min  (A) (by C-G formula based on SCr of 1.04 mg/dL (H)). Liver Function Tests: No results for input(s): AST, ALT, ALKPHOS, BILITOT, PROT, ALBUMIN in the last 168 hours. No results for input(s): LIPASE, AMYLASE in the last 168 hours. No results for input(s): AMMONIA in the last 168 hours. Coagulation Profile: No results for input(s): INR, PROTIME in the last 168 hours. Cardiac Enzymes: No results for input(s): CKTOTAL, CKMB, CKMBINDEX, TROPONINI in the last 168 hours. BNP (last 3 results) No results for input(s): PROBNP in the last 8760 hours. HbA1C: Recent Labs    07/09/17 0500  HGBA1C 7.7*   CBG: Recent Labs  Lab 07/08/17 2347 07/09/17 0502 07/09/17 0732 07/09/17 1145 07/09/17 1605  GLUCAP 169* 152* 122* 136* 144*   Lipid Profile: No results for input(s): CHOL, HDL, LDLCALC, TRIG, CHOLHDL, LDLDIRECT in the last 72 hours. Thyroid Function Tests: No results for input(s): TSH, T4TOTAL, FREET4, T3FREE, THYROIDAB in the last 72 hours. Anemia Panel: No results for input(s): VITAMINB12, FOLATE, FERRITIN, TIBC, IRON, RETICCTPCT in the last 72 hours. Sepsis Labs: No results for input(s): PROCALCITON, LATICACIDVEN in the last 168 hours.  Recent Results (from the past 240 hour(s))  Urine culture     Status: None   Collection Time: 07/06/17 11:47 PM  Result Value Ref Range Status   Specimen Description   Final    URINE, RANDOM Performed at [facility], [address] [place], [place] [address]    Special Requests   Final    NONE Performed at [facility], [address] [place], [place] [address]    Culture   Final    NO GROWTH Performed at [facility], [place] [address] [place],  [address]    Report Status 07/08/2017 FINAL  Final      Radiology Studies: Ct Abdomen Pelvis W Contrast  Result Date: 07/08/2017 CLINICAL DATA:  Mid abdominal pain with nausea and vomiting over the last 6 days. EXAM: CT ABDOMEN AND PELVIS WITH CONTRAST  TECHNIQUE: Multidetector CT imaging of the abdomen and pelvis was performed using the standard protocol following bolus administration of intravenous contrast. CONTRAST:  167mL ISOVUE-300 IOPAMIDOL (ISOVUE-300) INJECTION 61% COMPARISON:  07/05/2017 FINDINGS: Lower chest: Tiny amount of pleural fluid dependent on the left with minimal dependent atelectasis. Otherwise negative. Hepatobiliary: Liver parenchyma appears normal. Previous cholecystectomy. Pancreas: Normal Spleen: Normal Adrenals/Urinary Tract: Adrenal glands are normal. Kidneys are incompletely rotated but otherwise normal. No cyst, mass, stone or hydronephrosis. Bladder within normal limits. Stomach/Bowel: Moderate amount of fecal matter in the colon. No evidence of small bowel obstruction. Normal appearing appendix. No other finding. Vascular/Lymphatic: Aortic atherosclerosis. No aneurysm. IVC is normal. No adenopathy. Retroperitoneal clips in the lower abdomen and pelvis. Reproductive: Previous hysterectomy.  No pelvic mass. Other: No free fluid or air. Musculoskeletal: Ordinary lower lumbar and thoracolumbar degenerative changes. IMPRESSION: No cause of the acute presentation is identified. No evidence of bowel obstruction. The patient does have a moderate amount of fecal matter within the colon. Tiny pleural effusion on the  left layering dependently. Previous cholecystectomy. Previous hysterectomy. Previous retroperitoneal node dissection. No evidence of tumor or adenopathy presently. Electronically Signed   By: Nelson Chimes M.D.   On: 07/08/2017 15:52    Scheduled Meds: . carvedilol  25 mg Oral BID WC  . fluticasone furoate-vilanterol  1 puff Inhalation Daily  . heparin  5,000 Units Subcutaneous Q8H  . insulin aspart  0-9 Units Subcutaneous Q4H  . iopamidol  30 mL Oral Once  . polyethylene glycol  17 g Oral Daily   Continuous Infusions: . sodium chloride 75 mL/hr at 07/09/17 1122  . cefTRIAXone (ROCEPHIN)  IV Stopped (07/08/17 2243)  .  famotidine (PEPCID) IV Stopped (07/09/17 1019)     LOS: 1 day    Time spent: Total of 35 minutes spent with pt, greater than 50% of which was spent in discussion of  treatment, counseling and coordination of care  Chipper Oman, MD Pager: Text Page via [email or web address]   If 7PM-7AM, please contact night-coverage [email or web address] 07/09/2017, 4:14 PM   Note - This record has been created using [employer]. Chart creation errors have been sought, but may not always have been located. Such creation errors do not reflect on the standard of medical care.

## 2017-07-10 ENCOUNTER — Inpatient Hospital Stay (HOSPITAL_COMMUNITY): Payer: 59

## 2017-07-10 LAB — CBC WITH DIFFERENTIAL/PLATELET
BASOS ABS: 0 10*3/uL (ref 0.0–0.1)
BASOS PCT: 0 %
EOS ABS: 0.3 10*3/uL (ref 0.0–0.7)
Eosinophils Relative: 5 %
HCT: 37.3 % (ref 36.0–46.0)
HEMOGLOBIN: 12.7 g/dL (ref 12.0–15.0)
Lymphocytes Relative: 47 %
Lymphs Abs: 2.9 10*3/uL (ref 0.7–4.0)
MCH: 30 pg (ref 26.0–34.0)
MCHC: 34 g/dL (ref 30.0–36.0)
MCV: 88 fL (ref 78.0–100.0)
MONO ABS: 0.6 10*3/uL (ref 0.1–1.0)
MONOS PCT: 10 %
NEUTROS PCT: 38 %
Neutro Abs: 2.4 10*3/uL (ref 1.7–7.7)
Platelets: 243 10*3/uL (ref 150–400)
RBC: 4.24 MIL/uL (ref 3.87–5.11)
RDW: 13.4 % (ref 11.5–15.5)
WBC: 6.2 10*3/uL (ref 4.0–10.5)

## 2017-07-10 LAB — BASIC METABOLIC PANEL
Anion gap: 6 (ref 5–15)
BUN: 18 mg/dL (ref 6–20)
CALCIUM: 8.4 mg/dL — AB (ref 8.9–10.3)
CO2: 26 mmol/L (ref 22–32)
CREATININE: 1.12 mg/dL — AB (ref 0.44–1.00)
Chloride: 106 mmol/L (ref 101–111)
GFR, EST NON AFRICAN AMERICAN: 55 mL/min — AB (ref >60)
Glucose, Bld: 97 mg/dL (ref 65–99)
Potassium: 3.3 mmol/L — ABNORMAL LOW (ref 3.5–5.1)
Sodium: 138 mmol/L (ref 135–145)

## 2017-07-10 LAB — GLUCOSE, CAPILLARY
GLUCOSE-CAPILLARY: 105 mg/dL — AB (ref 65–99)
GLUCOSE-CAPILLARY: 134 mg/dL — AB (ref 65–99)
GLUCOSE-CAPILLARY: 249 mg/dL — AB (ref 65–99)
GLUCOSE-CAPILLARY: 83 mg/dL (ref 65–99)
Glucose-Capillary: 125 mg/dL — ABNORMAL HIGH (ref 65–99)
Glucose-Capillary: 69 mg/dL (ref 65–99)
Glucose-Capillary: 90 mg/dL (ref 65–99)

## 2017-07-10 MED ORDER — INSULIN REGULAR HUMAN 100 UNIT/ML IJ SOLN
0.0000 [IU] | INTRAMUSCULAR | Status: DC
  Administered 2017-07-10 – 2017-07-11 (×2): 3 [IU] via SUBCUTANEOUS
  Filled 2017-07-10: qty 0.09

## 2017-07-10 MED ORDER — DEXTROSE 50 % IV SOLN
25.0000 g | Freq: Once | INTRAVENOUS | Status: AC
  Administered 2017-07-10: 25 mL via INTRAVENOUS

## 2017-07-10 MED ORDER — DEXTROSE 50 % IV SOLN
INTRAVENOUS | Status: AC
  Administered 2017-07-10: 25 mL via INTRAVENOUS
  Filled 2017-07-10: qty 50

## 2017-07-10 MED ORDER — BISACODYL 5 MG PO TBEC
10.0000 mg | DELAYED_RELEASE_TABLET | Freq: Once | ORAL | Status: AC
  Administered 2017-07-10: 10 mg via ORAL
  Filled 2017-07-10: qty 2

## 2017-07-10 MED ORDER — POTASSIUM CHLORIDE 10 MEQ/100ML IV SOLN
10.0000 meq | INTRAVENOUS | Status: AC
  Administered 2017-07-10 (×2): 10 meq via INTRAVENOUS
  Filled 2017-07-10 (×2): qty 100

## 2017-07-10 MED ORDER — TECHNETIUM TC 99M SULFUR COLLOID: 2.1000 | Freq: Once | INTRAVENOUS | Status: DC | PRN

## 2017-07-10 MED ORDER — TRAZODONE HCL 50 MG PO TABS
50.0000 mg | ORAL_TABLET | Freq: Once | ORAL | Status: AC
  Administered 2017-07-10: 50 mg via ORAL
  Filled 2017-07-10: qty 1

## 2017-07-10 NOTE — Plan of Care (Signed)
  Problem: Safety: Goal: Ability to remain free from injury will improve Outcome: Progressing   

## 2017-07-10 NOTE — Progress Notes (Signed)
PROGRESS NOTE [facility]   [name]   YOV:[id number] DOB: 23-Aug-1965  DOA: 07/06/2017 PCP: Marjory Sneddon, MD   Brief Narrative:  [name] is a 52 year old female with medical history significant for CAD, diabetes mellitus, neuropathy and hyperlipidemia.  Patient presented to the emergency department complaining of nausea, vomiting, abdominal and flank pain.  3 days prior to admission she was evaluated at [facility] where she was diagnosed with UTI and was discharged on ciprofloxacin, however patient was unable to keep down her medication due to nausea and vomiting.  Upon ED evaluation at [facility] patient was found to have increasing creatinine to 1.4.  She was admitted for UTI complicated with AKI, failure outpatient therapy trial.  Of note CT abdomen/pelvis on 5/26 from [facility], consistent with cystitis however negative for enteritis.  Scan that showed enteritis is from 2018.  Subjective: Seen and examined, abdominal pain has somewhat improved, still some nauseous, no further vomiting.  She is n.p.o. for gastric empty study  Assessment & Plan: Abdominal pain, nausea and vomiting Unclear etiology at this time, CT abdomen and pelvis shows no acute abnormality. ?  Gastroparesis, peptic ulcer.  Patient was started on Reglan, slight improvement.  Awaiting gastric emptying study.  Phenergan has been discontinued.  Continue Pepcid IV twice daily twice.  If gastric emptying study negative will consult GI.  Advance to clear liquid if tolerated  Constipation  Likely contributing to abdominal pain, last BM 1 week ago  Continue miralax, will add tap water enema Give another dose of bisacodyl   Acute cystitis Urinalysis on 5/26 at outside facilities consistent with urinary tract infection, however urine cultures were not obtained.  In-house urine culture negative,  likely masked after antibiotic use. No signs of  pyelonephritis on CT abdomen/pelvis.  Will treat with abx therapy for 5 days. Continue rocephin for now, when ablet to tolerate po will switch to oral abx.   AKI on CKD stage III Cr plateau.  Monitor renal function   DM type II Hold Tresiba while n.p.o. SSI with Regular insulin as patient claim allergies to Novolog  A1c 7.7  Hypertension BP stable Continue Coreg and hydralazine as needed Monitor BP closely  DVT prophylaxis: Heparin subq Code Status: Full code Family Communication: None at bedside Disposition Plan: To be determined  Consultants:   None  Procedures:   None  Antimicrobials:  Rocephin 5/28   Objective: Vitals:   07/09/17 1414 07/09/17 2043 07/10/17 0420 07/10/17 1052  BP: (!) 147/86 115/82 (!) 134/99   Pulse: 78 79 71   Resp: 16 18 19    Temp: 99.1 F (37.3 C) 98.7 F (37.1 C) 98.5 F (36.9 C)   TempSrc: Oral Oral Oral   SpO2: 100% 95% 99% 99%  Weight:      Height:        Intake/Output Summary (Last 24 hours) at 07/10/2017 1433 Last data filed at 07/10/2017 0551 Gross per 24 hour  Intake 1338.75 ml  Output -  Net 1338.75 ml   Filed Weights   07/06/17 2151  Weight: 86.2 kg (190 lb)    Examination:  General: Pt is alert, awake, not in acute distress Cardiovascular: RRR, S1/S2 +, no rubs, no gallops Respiratory: CTA bilaterally, no wheezing, no rhonchi Abdominal: Soft, mild tenderness diffuse, non distended + BS  Extremities: no edema, no cyanosis  Data Reviewed: I have personally reviewed following labs and imaging studies  CBC: Recent Labs  Lab  07/06/17 2348 07/08/17 0556 07/09/17 0909 07/10/17 0631  WBC 9.9 7.1 6.3 6.2  NEUTROABS 7.1  --  3.3 2.4  HGB 13.5 13.1 13.3 12.7  HCT 39.0 39.8 39.2 37.3  MCV 86.9 88.6 87.9 88.0  PLT 287 294 255 885   Basic Metabolic Panel: Recent Labs  Lab 07/06/17 2348 07/08/17 0556 07/09/17 0909 07/10/17 0631  NA 137 138 135 138  K 4.4 3.6 3.5 3.3*  CL 102 106 104 106  CO2 24 23 22 26    GLUCOSE 142* 96 126* 97  BUN 41* 29* 18 18  CREATININE 1.41* 1.09* 1.04* 1.12*  CALCIUM 9.0 8.6* 8.5* 8.4*   GFR: Estimated Creatinine Clearance: 67.8 mL/min (A) (by C-G formula based on SCr of 1.12 mg/dL (H)). Liver Function Tests: No results for input(s): AST, ALT, ALKPHOS, BILITOT, PROT, ALBUMIN in the last 168 hours. No results for input(s): LIPASE, AMYLASE in the last 168 hours. No results for input(s): AMMONIA in the last 168 hours. Coagulation Profile: No results for input(s): INR, PROTIME in the last 168 hours. Cardiac Enzymes: No results for input(s): CKTOTAL, CKMB, CKMBINDEX, TROPONINI in the last 168 hours. BNP (last 3 results) No results for input(s): PROBNP in the last 8760 hours. HbA1C: Recent Labs    07/09/17 0500  HGBA1C 7.7*   CBG: Recent Labs  Lab 07/09/17 2359 07/10/17 0423 07/10/17 0714 07/10/17 1139 07/10/17 1217  GLUCAP 134* 105* 83 69 125*   Lipid Profile: No results for input(s): CHOL, HDL, LDLCALC, TRIG, CHOLHDL, LDLDIRECT in the last 72 hours. Thyroid Function Tests: No results for input(s): TSH, T4TOTAL, FREET4, T3FREE, THYROIDAB in the last 72 hours. Anemia Panel: No results for input(s): VITAMINB12, FOLATE, FERRITIN, TIBC, IRON, RETICCTPCT in the last 72 hours. Sepsis Labs: No results for input(s): PROCALCITON, LATICACIDVEN in the last 168 hours.  Recent Results (from the past 240 hour(s))  Urine culture     Status: None   Collection Time: 07/06/17 11:47 PM  Result Value Ref Range Status   Specimen Description   Final    URINE, RANDOM Performed at [facility], [address] [place], [place] [address]    Special Requests   Final    NONE Performed at [facility], [address] [place], [place] [address]    Culture   Final    NO GROWTH Performed at [facility], [place] [address] [place], [place] [address]    Report Status 07/08/2017 FINAL  Final      Radiology Studies: Ct Abdomen Pelvis W  Contrast  Result Date: 07/08/2017 CLINICAL DATA:  Mid abdominal pain with nausea and vomiting over the last 6 days. EXAM: CT ABDOMEN AND PELVIS WITH CONTRAST TECHNIQUE: Multidetector CT imaging of the abdomen and pelvis was performed using the standard protocol following bolus administration of intravenous contrast. CONTRAST:  123mL ISOVUE-300 IOPAMIDOL (ISOVUE-300) INJECTION 61% COMPARISON:  07/05/2017 FINDINGS: Lower chest: Tiny amount of pleural fluid dependent on the left with minimal dependent atelectasis. Otherwise negative. Hepatobiliary: Liver parenchyma appears normal. Previous cholecystectomy. Pancreas: Normal Spleen: Normal Adrenals/Urinary Tract: Adrenal glands are normal. Kidneys are incompletely rotated but otherwise normal. No cyst, mass, stone or hydronephrosis. Bladder within normal limits. Stomach/Bowel: Moderate amount of fecal matter in the colon. No evidence of small bowel obstruction. Normal appearing appendix. No other finding. Vascular/Lymphatic: Aortic atherosclerosis. No aneurysm. IVC is normal. No adenopathy. Retroperitoneal clips in the lower abdomen and pelvis. Reproductive: Previous hysterectomy.  No pelvic mass. Other: No  free fluid or air. Musculoskeletal: Ordinary lower lumbar and thoracolumbar degenerative changes. IMPRESSION: No cause of the acute presentation is identified. No evidence of bowel obstruction. The patient does have a moderate amount of fecal matter within the colon. Tiny pleural effusion on the left layering dependently. Previous cholecystectomy. Previous hysterectomy. Previous retroperitoneal node dissection. No evidence of tumor or adenopathy presently. Electronically Signed   By: Nelson Chimes M.D.   On: 07/08/2017 15:52    Scheduled Meds: . carvedilol  25 mg Oral BID WC  . fluticasone furoate-vilanterol  1 puff Inhalation Daily  . heparin  5,000 Units Subcutaneous Q8H  . insulin regular  0-9 Units Subcutaneous Q4H  . iopamidol  30 mL Oral Once  .  metoCLOPramide (REGLAN) injection  10 mg Intravenous Q8H  . polyethylene glycol  17 g Oral Daily   Continuous Infusions: . sodium chloride 75 mL/hr at 07/09/17 2306  . cefTRIAXone (ROCEPHIN)  IV Stopped (07/09/17 2329)  . famotidine (PEPCID) IV Stopped (07/09/17 2148)     LOS: 2 days    Time spent: Total of 35 minutes spent with pt, greater than 50% of which was spent in discussion of  treatment, counseling and coordination of care  Chipper Oman, MD Pager: Text Page via [email or web address]   If 7PM-7AM, please contact night-coverage [email or web address] 07/10/2017, 2:33 PM   Note - This record has been created using [employer]. Chart creation errors have been sought, but may not always have been located. Such creation errors do not reflect on the standard of medical care.

## 2017-07-11 LAB — BASIC METABOLIC PANEL
ANION GAP: 6 (ref 5–15)
BUN: 17 mg/dL (ref 6–20)
CHLORIDE: 110 mmol/L (ref 101–111)
CO2: 25 mmol/L (ref 22–32)
Calcium: 8.3 mg/dL — ABNORMAL LOW (ref 8.9–10.3)
Creatinine, Ser: 1.13 mg/dL — ABNORMAL HIGH (ref 0.44–1.00)
GFR calc Af Amer: >60 mL/min (ref >60)
GFR, EST NON AFRICAN AMERICAN: 55 mL/min — AB (ref >60)
GLUCOSE: 69 mg/dL (ref 65–99)
POTASSIUM: 3.7 mmol/L (ref 3.5–5.1)
Sodium: 141 mmol/L (ref 135–145)

## 2017-07-11 LAB — GLUCOSE, CAPILLARY
GLUCOSE-CAPILLARY: 72 mg/dL (ref 65–99)
GLUCOSE-CAPILLARY: 117 mg/dL — AB (ref 65–99)
Glucose-Capillary: 220 mg/dL — ABNORMAL HIGH (ref 65–99)
Glucose-Capillary: 85 mg/dL (ref 65–99)

## 2017-07-11 LAB — MAGNESIUM: MAGNESIUM: 1.8 mg/dL (ref 1.7–2.4)

## 2017-07-11 MED ORDER — FAMOTIDINE 20 MG PO TABS
20.0000 mg | ORAL_TABLET | Freq: Two times a day (BID) | ORAL | Status: DC
  Administered 2017-07-11: 20 mg via ORAL
  Filled 2017-07-11: qty 1

## 2017-07-11 MED ORDER — BISACODYL 10 MG RE SUPP
10.0000 mg | Freq: Once | RECTAL | Status: AC
  Administered 2017-07-11: 10 mg via RECTAL
  Filled 2017-07-11: qty 1

## 2017-07-11 MED ORDER — CEPHALEXIN 500 MG PO CAPS
500.0000 mg | ORAL_CAPSULE | Freq: Two times a day (BID) | ORAL | Status: DC
  Administered 2017-07-11: 500 mg via ORAL
  Filled 2017-07-11: qty 1

## 2017-07-11 MED ORDER — BISACODYL 10 MG RE SUPP
10.0000 mg | Freq: Once | RECTAL | Status: DC
  Filled 2017-07-11: qty 1

## 2017-07-11 MED ORDER — SENNOSIDES-DOCUSATE SODIUM 8.6-50 MG PO TABS: 2.0000 | ORAL_TABLET | Freq: Two times a day (BID) | ORAL | 1 refills | Status: DC

## 2017-07-11 MED ORDER — TRAMADOL HCL 50 MG PO TABS: 50.0000 mg | ORAL_TABLET | Freq: Four times a day (QID) | ORAL | 0 refills | Status: DC | PRN

## 2017-07-11 MED ORDER — METOCLOPRAMIDE HCL 10 MG PO TABS: 10.0000 mg | ORAL_TABLET | Freq: Three times a day (TID) | ORAL | 0 refills | Status: DC | PRN

## 2017-07-11 MED ORDER — METOCLOPRAMIDE HCL 10 MG PO TABS
10.0000 mg | ORAL_TABLET | Freq: Three times a day (TID) | ORAL | Status: DC
  Administered 2017-07-11: 10 mg via ORAL
  Filled 2017-07-11: qty 1

## 2017-07-11 MED ORDER — POLYETHYLENE GLYCOL 3350 17 G PO PACK: 17.0000 g | PACK | Freq: Every day | ORAL | 0 refills | Status: DC

## 2017-07-11 MED ORDER — ONDANSETRON HCL 4 MG PO TABS: 4.0000 mg | ORAL_TABLET | Freq: Four times a day (QID) | ORAL | 0 refills | Status: DC | PRN

## 2017-07-11 NOTE — Discharge Summary (Signed)
Physician Discharge Summary  [name]  YQM:[id number]  DOB: 04-Mar-1965  DOA: 07/06/2017 PCP: Marjory Sneddon, MD  Admit date: 07/06/2017 Discharge date: 07/11/2017  Admitted From: Home  Disposition: Home   Recommendations for Outpatient Follow-up:  1. Follow up with PCP in 1 weeks 2. Please obtain BMP/CBC in one week to monitor renal function and Hgb   Discharge Condition: Stable  CODE STATUS: Full Code  Diet recommendation: Heart Healthy/ Carb mod   Brief/Interim Summary: For full details see H&P/Progress note, but in brief, [name] is a 52 year old female with medical history significant for CAD, diabetes mellitus, neuropathy and hyperlipidemia.  Patient presented to the emergency department complaining of nausea, vomiting, abdominal and flank pain.  3 days prior to admission she was evaluated at [facility] where she was diagnosed with UTI and was discharged on ciprofloxacin, however patient was unable to keep down her medication due to nausea and vomiting.  Upon ED evaluation at [facility] patient was found to have increasing creatinine to 1.4.  She was admitted for UTI complicated with AKI, failure outpatient therapy trial.  Subjective: Patient seen and examined, abdominal pain has resolved.  She is tolerating regular diet.  Gastric emptying study normal.  Wants to go home  Discharge Diagnoses/Hospital Course:  Abdominal pain, nausea and vomiting - resolved Unclear etiology, unable to determine clinically.  CT abdomen and pelvis shows no acute abnormality.  Gastric empty study with no abnormalities.  She is tolerating diet well.  Patient was started on Reglan with significant improvement.  Probably gastritis contributing.  Will continue Pepcid 20 mg twice daily.  If pain recurs recommend GI evaluation as an outpatient.  Constipation likely contributed to abdominal pain.  Follow-up with PCP.  Constipation  Suspect to be contributing to  abdominal pain, last bowel movement was 1 week ago.  She has received MiraLAX, tap water enema and bisacodyl.  No bowel movement did but patient will like to go home and continue therapy for constipation as an outpatient.  Will discharge on SennaKot and MiraLAX daily.  Bisacodyl suppository given prior to discharge.  Advised to follow-up with PCP.  Encourage oral hydration and ambulation.  Acute cystitis Urinalysis on 5/26 at outside facilities consistent with urinary tract infection, however urine cultures were not obtained.  In-house urine culture negative,  likely masked after antibiotic use. No signs of pyelonephritis on CT abdomen/pelvis.   Patient completed treatment with Rocephin during hospital stay.  AKI on CKD stage III Improved after IV hydration.  Creatinine has plateau and back to baseline  Return renal function in 1 week  DM type II CBGs stable during hospital stay Continue Tresiba A1c 7.7  Hypertension P stable during hospital stay Continue home dose Coreg with no changes.  All other chronic medical condition were stable during the hospitalization.  On the day of the discharge the patient's vitals were stable, and no other acute medical condition were reported by patient. the patient was felt safe to be discharge to home  Discharge Instructions  You were cared for by a hospitalist during your hospital stay. If you have any questions about your discharge medications or the care you received while you were in the hospital after you are discharged, you can call the unit and asked to speak with the hospitalist on call if the hospitalist that took care of you is not available. Once you are discharged, your primary care physician will handle any further medical issues. Please note  that NO REFILLS for any discharge medications will be authorized once you are discharged, as it is imperative that you return to your primary care physician (or establish a relationship with a primary  care physician if you do not have one) for your aftercare needs so that they can reassess your need for medications and monitor your lab values.  Discharge Instructions    Call MD for:  difficulty breathing, headache or visual disturbances   Complete by:  As directed    Call MD for:  extreme fatigue   Complete by:  As directed    Call MD for:  hives   Complete by:  As directed    Call MD for:  persistant dizziness or light-headedness   Complete by:  As directed    Call MD for:  persistant nausea and vomiting   Complete by:  As directed    Call MD for:  redness, tenderness, or signs of infection (pain, swelling, redness, odor or green/yellow discharge around incision site)   Complete by:  As directed    Call MD for:  severe uncontrolled pain   Complete by:  As directed    Call MD for:  temperature >100.4   Complete by:  As directed    Diet - low sodium heart healthy   Complete by:  As directed    Increase activity slowly   Complete by:  As directed      Allergies as of 07/11/2017      Reactions   Insulin Aspart Other (See Comments), Swelling   adverse reaction to Novolog   Latex Itching, Rash   Morphine Itching, Rash      Medication List    STOP taking these medications   ciprofloxacin 500 MG tablet Commonly known as:  CIPRO   oxycodone 5 MG capsule Commonly known as:  OXY-IR   promethazine 25 MG tablet Commonly known as:  PHENERGAN     TAKE these medications   aspirin 81 MG tablet Take 81 mg by mouth daily.   atorvastatin 80 MG tablet Commonly known as:  LIPITOR Take 80 mg by mouth daily.   BREO ELLIPTA 100-25 MCG/INH Aepb Generic drug:  fluticasone furoate-vilanterol Inhale 1 puff into the lungs daily.   carvedilol 25 MG tablet Commonly known as:  COREG Take 25 mg by mouth 2 (two) times daily with a meal.   famotidine 20 MG tablet Commonly known as:  PEPCID Take 20 mg by mouth 2 (two) times daily.   FARXIGA 10 MG Tabs tablet Generic drug:   dapagliflozin propanediol Take 10 mg by mouth daily.   fluticasone 50 MCG/ACT nasal spray Commonly known as:  FLONASE Place 2 sprays into the nose daily.   gabapentin 300 MG capsule Commonly known as:  NEURONTIN Take 300 mg by mouth 4 (four) times daily as needed for pain.   metoCLOPramide 10 MG tablet Commonly known as:  REGLAN Take 1 tablet (10 mg total) by mouth every 8 (eight) hours as needed for nausea.   ondansetron 4 MG tablet Commonly known as:  ZOFRAN Take 1 tablet (4 mg total) by mouth every 6 (six) hours as needed for nausea.   polyethylene glycol packet Commonly known as:  MIRALAX / GLYCOLAX Take 17 g by mouth daily. Start taking on:  04/13/74   PROAIR RESPICLICK 226 (90 Base) MCG/ACT Aepb Generic drug:  Albuterol Sulfate Inhale 2 puffs into the lungs at bedtime as needed for shortness of breath.   senna-docusate 8.6-50 MG tablet Commonly  known as:  Senokot-S Take 2 tablets by mouth 2 (two) times daily.   spironolactone 25 MG tablet Commonly known as:  ALDACTONE Take 25 mg by mouth daily.   tiZANidine 4 MG tablet Commonly known as:  ZANAFLEX Take 4 mg by mouth every 6 (six) hours as needed for muscle spasms.   traMADol 50 MG tablet Commonly known as:  ULTRAM Take 1 tablet (50 mg total) by mouth every 6 (six) hours as needed for moderate pain.   TRESIBA FLEXTOUCH 100 UNIT/ML Sopn FlexTouch Pen Generic drug:  insulin degludec Inject 40 Units into the skin daily at 10 pm.   Vitamin D3 400 units Caps Take 400 mg by mouth daily.      Follow-up Information    Johns, Celesta Gentile, MD. Schedule an appointment as soon as possible for a visit in 1 week(s).   Specialty:  Internal Medicine Why:  Hospital follow-up Contact information: [place] [address] [phone]          Allergies  Allergen Reactions  . Insulin Aspart Other (See Comments) and Swelling    adverse reaction to Novolog   . Latex Itching and Rash  .  Morphine Itching and Rash    Consultations:  None    Procedures/Studies: Nm Gastric Emptying  Result Date: 07/10/2017 CLINICAL DATA:  Epigastric pain and nausea with vomiting for 1 week. Diabetes. EXAM: NUCLEAR MEDICINE GASTRIC EMPTYING SCAN TECHNIQUE: After oral ingestion of radiolabeled meal, sequential abdominal images were obtained for 4 hours. Percentage of activity emptying the stomach was calculated at 1 hour, 2 hour, 3 hour, and 4 hours. RADIOPHARMACEUTICALS:  2.1 mCi Tc-40m sulfur colloid in standardized meal COMPARISON:  None. FINDINGS: Expected location of the stomach in the left upper quadrant. Ingested meal empties the stomach gradually over the course of the study. 38% emptied at 1 hr ( normal >= 10%) 59% emptied at 2 hr ( normal >= 40%) 74% emptied at 3 hr ( normal >= 70%) 86% emptied at 4 hr ( normal >= 90%) IMPRESSION: Normal gastric emptying up to 3 hours, with minimally delayed emptying noted at 4 hours. Electronically Signed   By: Earle Gell M.D.   On: 07/10/2017 17:21   Ct Abdomen Pelvis W Contrast  Result Date: 07/08/2017 CLINICAL DATA:  Mid abdominal pain with nausea and vomiting over the last 6 days. EXAM: CT ABDOMEN AND PELVIS WITH CONTRAST TECHNIQUE: Multidetector CT imaging of the abdomen and pelvis was performed using the standard protocol following bolus administration of intravenous contrast. CONTRAST:  177mL ISOVUE-300 IOPAMIDOL (ISOVUE-300) INJECTION 61% COMPARISON:  07/05/2017 FINDINGS: Lower chest: Tiny amount of pleural fluid dependent on the left with minimal dependent atelectasis. Otherwise negative. Hepatobiliary: Liver parenchyma appears normal. Previous cholecystectomy. Pancreas: Normal Spleen: Normal Adrenals/Urinary Tract: Adrenal glands are normal. Kidneys are incompletely rotated but otherwise normal. No cyst, mass, stone or hydronephrosis. Bladder within normal limits. Stomach/Bowel: Moderate amount of fecal matter in the colon. No evidence of small bowel  obstruction. Normal appearing appendix. No other finding. Vascular/Lymphatic: Aortic atherosclerosis. No aneurysm. IVC is normal. No adenopathy. Retroperitoneal clips in the lower abdomen and pelvis. Reproductive: Previous hysterectomy.  No pelvic mass. Other: No free fluid or air. Musculoskeletal: Ordinary lower lumbar and thoracolumbar degenerative changes. IMPRESSION: No cause of the acute presentation is identified. No evidence of bowel obstruction. The patient does have a moderate amount of fecal matter within the colon. Tiny pleural effusion on the left layering dependently. Previous cholecystectomy. Previous hysterectomy. Previous retroperitoneal node dissection.  No evidence of tumor or adenopathy presently. Electronically Signed   By: Nelson Chimes M.D.   On: 07/08/2017 15:52    Discharge Exam: Vitals:   07/11/17 0858 07/11/17 1405  BP:  128/81  Pulse:  81  Resp:  16  Temp:  98.3 F (36.8 C)  SpO2: 97% 97%   Vitals:   07/11/17 0414 07/11/17 0856 07/11/17 0858 07/11/17 1405  BP: 91/69   128/81  Pulse: 77   81  Resp: 17   16  Temp: 98.1 F (36.7 C)   98.3 F (36.8 C)  TempSrc: Oral   Oral  SpO2: 97% 97% 97% 97%  Weight:      Height:        General: Pt is alert, awake, not in acute distress Cardiovascular: RRR, S1/S2 +, no rubs, no gallops Respiratory: CTA bilaterally, no wheezing, no rhonchi Abdominal: Soft, NT, ND, bowel sounds + Extremities: no edema, no cyanosis   The results of significant diagnostics from this hospitalization (including imaging, microbiology, ancillary and laboratory) are listed below for reference.     Microbiology: Recent Results (from the past 240 hour(s))  Urine culture     Status: None   Collection Time: 07/06/17 11:47 PM  Result Value Ref Range Status   Specimen Description   Final    URINE, RANDOM Performed at [facility], [address] [place], [place] [address]    Special Requests   Final    NONE Performed at [facility], [address] [place], [place] [address]    Culture   Final    NO GROWTH Performed at [facility], [place] [address], [place], [place] [address]    Report Status 07/08/2017 FINAL  Final     Labs: BNP (last 3 results) No results for input(s): BNP in the last 8760 hours. Basic Metabolic Panel: Recent Labs  Lab 07/06/17 2348 07/08/17 0556 07/09/17 0909 07/10/17 0631 07/11/17 0610  NA 137 138 135 138 141  K 4.4 3.6 3.5 3.3* 3.7  CL 102 106 104 106 110  CO2 24 23 22 26 25   GLUCOSE 142* 96 126* 97 69  BUN 41* 29* 18 18 17   CREATININE 1.41* 1.09* 1.04* 1.12* 1.13*  CALCIUM 9.0 8.6* 8.5* 8.4* 8.3*  MG  --   --   --   --  1.8   Liver Function Tests: No results for input(s): AST, ALT, ALKPHOS, BILITOT, PROT, ALBUMIN in the last 168 hours. No results for input(s): LIPASE, AMYLASE in the last 168 hours. No results for input(s): AMMONIA in the last 168 hours. CBC: Recent Labs  Lab 07/06/17 2348 07/08/17 0556 07/09/17 0909 07/10/17 0631  WBC 9.9 7.1 6.3 6.2  NEUTROABS 7.1  --  3.3 2.4  HGB 13.5 13.1 13.3 12.7  HCT 39.0 39.8 39.2 37.3  MCV 86.9 88.6 87.9 88.0  PLT 287 294 255 243   Cardiac Enzymes: No results for input(s): CKTOTAL, CKMB, CKMBINDEX, TROPONINI in the last 168 hours. BNP: Invalid input(s): POCBNP CBG: Recent Labs  Lab 07/10/17 2039 07/11/17 0015 07/11/17 0413 07/11/17 0718 07/11/17 1134  GLUCAP 249* 220* 85 72 117*   D-Dimer No results for input(s): DDIMER in the last 72 hours. Hgb A1c Recent Labs    07/09/17 0500  HGBA1C 7.7*   Lipid Profile No results for input(s): CHOL, HDL, LDLCALC, TRIG, CHOLHDL, LDLDIRECT in the last 72 hours. Thyroid function studies No results for input(s): TSH, T4TOTAL, T3FREE,  THYROIDAB in the last 72 hours.  Invalid input(s): FREET3 Anemia work up No results for input(s): VITAMINB12, FOLATE, FERRITIN, TIBC, IRON, RETICCTPCT in the last 72 hours. Urinalysis    Component Value Date/Time    COLORURINE YELLOW 07/06/2017 2347   APPEARANCEUR CLEAR 07/06/2017 2347   LABSPEC 1.025 07/06/2017 2347   PHURINE 6.0 07/06/2017 2347   GLUCOSEU >=500 (A) 07/06/2017 2347   HGBUR SMALL (A) 07/06/2017 2347   HGBUR negative 01/08/2010 [place] 07/06/2017 2347   KETONESUR 15 (A) 07/06/2017 2347   PROTEINUR 30 (A) 07/06/2017 2347   UROBILINOGEN 1.0 11/14/2013 0741   NITRITE NEGATIVE 07/06/2017 2347   LEUKOCYTESUR SMALL (A) 07/06/2017 2347   Sepsis Labs Invalid input(s): PROCALCITONIN,  WBC,  LACTICIDVEN Microbiology Recent Results (from the past 240 hour(s))  Urine culture     Status: None   Collection Time: 07/06/17 11:47 PM  Result Value Ref Range Status   Specimen Description   Final    URINE, RANDOM Performed at [facility], [address] [place], [place] [address]    Special Requests   Final    NONE Performed at [facility], [address] [place], [place] [address]    Culture   Final    NO GROWTH Performed at [facility], [place] [address] [place], [place] [address]    Report Status 07/08/2017 FINAL  Final     Time coordinating discharge: 34 minutes  SIGNED:  Chipper Oman, MD  [facility] 07/11/2017, 3:46 PM  Pager please text page via  [email or web address]  Note - This record has been created using [employer]. Chart creation errors have been sought, but may not always have been located. Such creation errors do not reflect on the standard of medical care.

## 2017-07-11 NOTE — Progress Notes (Signed)
[name] to be D/C'd Home per MD order.  Discussed prescriptions and follow up appointments with the patient. Prescriptions given to patient, medication list explained in detail. Pt verbalized understanding.  Allergies as of 07/11/2017      Reactions   Insulin Aspart Other (See Comments), Swelling   adverse reaction to Novolog   Latex Itching, Rash   Morphine Itching, Rash      Medication List    STOP taking these medications   ciprofloxacin 500 MG tablet Commonly known as:  CIPRO   oxycodone 5 MG capsule Commonly known as:  OXY-IR   promethazine 25 MG tablet Commonly known as:  PHENERGAN     TAKE these medications   aspirin 81 MG tablet Take 81 mg by mouth daily.   atorvastatin 80 MG tablet Commonly known as:  LIPITOR Take 80 mg by mouth daily.   BREO ELLIPTA 100-25 MCG/INH Aepb Generic drug:  fluticasone furoate-vilanterol Inhale 1 puff into the lungs daily.   carvedilol 25 MG tablet Commonly known as:  COREG Take 25 mg by mouth 2 (two) times daily with a meal.   famotidine 20 MG tablet Commonly known as:  PEPCID Take 20 mg by mouth 2 (two) times daily.   FARXIGA 10 MG Tabs tablet Generic drug:  dapagliflozin propanediol Take 10 mg by mouth daily.   fluticasone 50 MCG/ACT nasal spray Commonly known as:  FLONASE Place 2 sprays into the nose daily.   gabapentin 300 MG capsule Commonly known as:  NEURONTIN Take 300 mg by mouth 4 (four) times daily as needed for pain.   metoCLOPramide 10 MG tablet Commonly known as:  REGLAN Take 1 tablet (10 mg total) by mouth every 8 (eight) hours as needed for nausea.   ondansetron 4 MG tablet Commonly known as:  ZOFRAN Take 1 tablet (4 mg total) by mouth every 6 (six) hours as needed for nausea.   polyethylene glycol packet Commonly known as:  MIRALAX / GLYCOLAX Take 17 g by mouth daily. Start taking on:  02/15/1094   PROAIR RESPICLICK 045 (90 Base) MCG/ACT Aepb Generic drug:  Albuterol Sulfate Inhale 2  puffs into the lungs at bedtime as needed for shortness of breath.   senna-docusate 8.6-50 MG tablet Commonly known as:  Senokot-S Take 2 tablets by mouth 2 (two) times daily.   spironolactone 25 MG tablet Commonly known as:  ALDACTONE Take 25 mg by mouth daily.   tiZANidine 4 MG tablet Commonly known as:  ZANAFLEX Take 4 mg by mouth every 6 (six) hours as needed for muscle spasms.   traMADol 50 MG tablet Commonly known as:  ULTRAM Take 1 tablet (50 mg total) by mouth every 6 (six) hours as needed for moderate pain.   TRESIBA FLEXTOUCH 100 UNIT/ML Sopn FlexTouch Pen Generic drug:  insulin degludec Inject 40 Units into the skin daily at 10 pm.   Vitamin D3 400 units Caps Take 400 mg by mouth daily.       Vitals:   07/11/17 0858 07/11/17 1405  BP:  128/81  Pulse:  81  Resp:  16  Temp:  98.3 F (36.8 C)  SpO2: 97% 97%    Skin clean, dry and intact without evidence of skin break down, no evidence of skin tears noted. IV catheter discontinued intact. Site without signs and symptoms of complications. Dressing and pressure applied. Pt denies pain at this time. No complaints noted.  An After Visit Summary was printed and given to the patient. Patient escorted  via WC, and D/C home via private auto.  Haywood Lasso BSN, RN [employer] Phone [phone]

## 2017-07-20 ENCOUNTER — Encounter: Payer: Self-pay | Admitting: Medical

## 2017-07-20 ENCOUNTER — Ambulatory Visit (INDEPENDENT_AMBULATORY_CARE_PROVIDER_SITE_OTHER): Payer: 59 | Admitting: Medical

## 2017-07-20 VITALS — BP 130/88 | HR 93 | Temp 98.3°F | Resp 16 | Ht 72.0 in | Wt 188.6 lb

## 2017-07-20 DIAGNOSIS — I10 Essential (primary) hypertension: Secondary | ICD-10-CM | POA: Diagnosis not present

## 2017-07-20 DIAGNOSIS — E785 Hyperlipidemia, unspecified: Secondary | ICD-10-CM | POA: Diagnosis not present

## 2017-07-20 DIAGNOSIS — E119 Type 2 diabetes mellitus without complications: Secondary | ICD-10-CM

## 2017-07-20 DIAGNOSIS — G629 Polyneuropathy, unspecified: Secondary | ICD-10-CM | POA: Insufficient documentation

## 2017-07-20 DIAGNOSIS — R1013 Epigastric pain: Secondary | ICD-10-CM | POA: Diagnosis not present

## 2017-07-20 DIAGNOSIS — R112 Nausea with vomiting, unspecified: Secondary | ICD-10-CM | POA: Diagnosis not present

## 2017-07-20 HISTORY — DX: Polyneuropathy, unspecified: G62.9

## 2017-07-20 LAB — LIPASE: LIPASE: 40 U/L (ref 11.0–59.0)

## 2017-07-20 LAB — CBC WITH DIFFERENTIAL/PLATELET
BASOS PCT: 1 % (ref 0.0–3.0)
Basophils Absolute: 0.1 10*3/uL (ref 0.0–0.1)
EOS ABS: 0.2 10*3/uL (ref 0.0–0.7)
Eosinophils Relative: 4 % (ref 0.0–5.0)
HCT: 41.8 % (ref 36.0–46.0)
Hemoglobin: 13.6 g/dL (ref 12.0–15.0)
LYMPHS ABS: 1.9 10*3/uL (ref 0.7–4.0)
Lymphocytes Relative: 30.8 % (ref 12.0–46.0)
MCHC: 32.6 g/dL (ref 30.0–36.0)
MCV: 90.9 fl (ref 78.0–100.0)
Monocytes Absolute: 0.3 10*3/uL (ref 0.1–1.0)
Monocytes Relative: 5.5 % (ref 3.0–12.0)
NEUTROS ABS: 3.5 10*3/uL (ref 1.4–7.7)
NEUTROS PCT: 58.7 % (ref 43.0–77.0)
PLATELETS: 325 10*3/uL (ref 150.0–400.0)
RBC: 4.6 Mil/uL (ref 3.87–5.11)
RDW: 15 % (ref 11.5–15.5)
WBC: 6 10*3/uL (ref 4.0–10.5)

## 2017-07-20 LAB — COMPREHENSIVE METABOLIC PANEL
ALBUMIN: 4.1 g/dL (ref 3.5–5.2)
ALT: 42 U/L — ABNORMAL HIGH (ref 0–35)
AST: 30 U/L (ref 0–37)
Alkaline Phosphatase: 104 U/L (ref 39–117)
BUN: 20 mg/dL (ref 6–23)
CHLORIDE: 102 meq/L (ref 96–112)
CO2: 32 mEq/L (ref 19–32)
CREATININE: 1.08 mg/dL (ref 0.40–1.20)
Calcium: 9.9 mg/dL (ref 8.4–10.5)
GFR: 68.36 mL/min (ref >60.00)
GLUCOSE: 124 mg/dL — AB (ref 70–99)
Potassium: 4.4 mEq/L (ref 3.5–5.1)
SODIUM: 142 meq/L (ref 135–145)
Total Bilirubin: 0.3 mg/dL (ref 0.2–1.2)
Total Protein: 7.7 g/dL (ref 6.0–8.3)

## 2017-07-20 LAB — AMYLASE: AMYLASE: 126 U/L (ref 27–131)

## 2017-07-20 MED ORDER — SUCRALFATE 1 G PO TABS: 1.0000 g | ORAL_TABLET | Freq: Three times a day (TID) | ORAL | 0 refills | Status: DC

## 2017-07-20 MED ORDER — OMEPRAZOLE 20 MG PO CPDR: 20.0000 mg | DELAYED_RELEASE_CAPSULE | Freq: Every day | ORAL | 3 refills | Status: DC

## 2017-07-20 MED ORDER — ONDANSETRON 4 MG PO TBDP: 4.0000 mg | ORAL_TABLET | Freq: Three times a day (TID) | ORAL | 0 refills | Status: DC | PRN

## 2017-07-20 NOTE — Progress Notes (Signed)
Subjective:    Patient ID: [name], female    DOB: 09-Oct-1965, 52 y.o.   MRN: [id number]  HPI  Pt in for first time.  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 She is working 2 jobs, She drinks rare soda.(one glass at most).   Pt states Jul 04, 2017. She states she had 2 days of vomiting episodes. Pt states after work up did not find cause. Pt had CT during admission. By CT she states told stomach was inflammed.(went to [facility]). Pt review of her pcp note she had  large amount of stool in her colon on the CT scan of the abdomen/pelvis which could have contributed to her symptoms of nausea, vomiting and abdominal pain.  At [facility] she was given cipro for cystitis,    [facility]nd admission with [facility] and [facility] in July 11, 2017. She had stomach emptying test which was normal. Pt states since discharged she was still having abdomen pain. She did follow up with her prior pcp on June 4,2019. Pt states at [facility] the also gave antibiotic for infection. Her pcp did urine study and told her no longer has uti.  Pt states her stomach pain has been on and off since her last visit. Enough that she feels can't return back to work.  Pt throughout the years she had some severe reflux. In 2001 had her gallbladder removed. Since she had gallbladder removed her reflux symptoms did improve. She would just report spordic reflux symptoms with particular foods.  In 2001 had cervical cancer and then hysterctomy.    On June 4,2019. Her pcp did labs  Pt metabolic panel showed high sugar at 160. Sodium 146. Her infection fighting cells were not elevated.   Pt is diabetic. Pt last a1c approximate 7.6 per pt. Done about 2.5 months ago. Pt on tresiba and farxiga.  Pt has htn and hyperlipidemia.   Pt feels like can't work since still in pain. 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 Her former pcp had recommended she return to  work.   Review of Systems  Constitutional: Negative for chills, fatigue and fever.  Respiratory: Negative for chest tightness, shortness of breath and wheezing.   Cardiovascular: Negative for chest pain and palpitations.  Gastrointestinal: Positive for abdominal pain, nausea and vomiting. Negative for constipation and diarrhea.  Musculoskeletal: Negative for back pain.  Skin: Negative for rash.  Neurological: Negative for dizziness and headaches.  Hematological: Negative for adenopathy. Does not bruise/bleed easily.  Psychiatric/Behavioral: Negative for behavioral problems and confusion. The patient is not nervous/anxious.     Past Medical History:  Diagnosis Date  . Cervical cancer ([place])    had surgery in 2001.  . Coronary artery disease    30% lesions noted  . Depression   . Diabetes mellitus   . GERD (gastroesophageal reflux disease)   . Hyperlipidemia   . Hypertension   . Neuropathy      Social History   Socioeconomic History  . Marital status: Single    Spouse name: Not on file  . Number of children: Not on file  . Years of education: Not on file  . Highest education level: Not on file  Occupational History  . Not on file  Social Needs  . Financial resource strain: Not on file  . Food insecurity:    Worry: Not on file    Inability: Not on file  . Transportation needs:  Medical: Not on file    Non-medical: Not on file  Tobacco Use  . Smoking status: Never Smoker  . Smokeless tobacco: Never Used  Substance and Sexual Activity  . Alcohol use: Yes    Comment: occ. Very rare.   . Drug use: No  . Sexual activity: Not on file  Lifestyle  . Physical activity:    Days per week: Not on file    Minutes per session: Not on file  . Stress: Not on file  Relationships  . Social connections:    Talks on phone: Not on file    Gets together: Not on file    Attends religious service: Not on file    Active member of club or organization: Not on file    Attends  meetings of clubs or organizations: Not on file    Relationship status: Not on file  . Intimate partner violence:    Fear of current or ex partner: Not on file    Emotionally abused: Not on file    Physically abused: Not on file    Forced sexual activity: Not on file  Other Topics Concern  . Not on file  Social History Narrative  . Not on file    Past Surgical History:  Procedure Laterality Date  . ABDOMINAL HYSTERECTOMY     partial  . CHOLECYSTECTOMY      Family History  Problem Relation Age of Onset  . Diabetes Mother   . Diabetes Father     Allergies  Allergen Reactions  . Insulin Aspart Other (See Comments) and Swelling    adverse reaction to Novolog   . Latex Itching and Rash  . Morphine Itching and Rash    Current Outpatient Medications on File Prior to Visit  Medication Sig Dispense Refill  . Albuterol Sulfate (PROAIR RESPICLICK) 409 (90 Base) MCG/ACT AEPB Inhale 2 puffs into the lungs at bedtime as needed for shortness of breath.    Marland Kitchen aspirin 81 MG tablet Take 81 mg by mouth daily.    Marland Kitchen atorvastatin (LIPITOR) 80 MG tablet Take 80 mg by mouth daily.    . carvedilol (COREG) 25 MG tablet Take 25 mg by mouth 2 (two) times daily with a meal.    . Cholecalciferol (VITAMIN D3) 400 units CAPS Take 400 mg by mouth daily.    . dapagliflozin propanediol (FARXIGA) 10 MG TABS tablet Take 10 mg by mouth daily.    . famotidine (PEPCID) 20 MG tablet Take 20 mg by mouth 2 (two) times daily.    . fluticasone furoate-vilanterol (BREO ELLIPTA) 100-25 MCG/INH AEPB Inhale 1 puff into the lungs daily.    Marland Kitchen gabapentin (NEURONTIN) 300 MG capsule Take 300 mg by mouth 4 (four) times daily as needed for pain.    Marland Kitchen insulin degludec (TRESIBA FLEXTOUCH) 100 UNIT/ML SOPN FlexTouch Pen Inject 40 Units into the skin daily at 10 pm.    . metoCLOPramide (REGLAN) 10 MG tablet Take 1 tablet (10 mg total) by mouth every 8 (eight) hours as needed for nausea. 20 tablet 0  . ondansetron (ZOFRAN) 4 MG  tablet Take 1 tablet (4 mg total) by mouth every 6 (six) hours as needed for nausea. 20 tablet 0  . oxyCODONE (OXY IR/ROXICODONE) 5 MG immediate release tablet Take 5 mg by mouth every 12 (twelve) hours as needed.  0  . polyethylene glycol (MIRALAX / GLYCOLAX) packet Take 17 g by mouth daily. 14 each 0  . senna-docusate (SENOKOT-S) 8.6-50 MG tablet Take  2 tablets by mouth 2 (two) times daily. 60 tablet 1  . spironolactone (ALDACTONE) 25 MG tablet Take 25 mg by mouth daily.  1  . tiZANidine (ZANAFLEX) 4 MG tablet Take 4 mg by mouth every 6 (six) hours as needed for muscle spasms.    . traMADol (ULTRAM) 50 MG tablet Take 1 tablet (50 mg total) by mouth every 6 (six) hours as needed for moderate pain. 12 tablet 0  . fluticasone (FLONASE) 50 MCG/ACT nasal spray Place 2 sprays into the nose daily. 16 g 0   No current facility-administered medications on file prior to visit.     BP 130/88   Pulse 93   Temp 98.3 F (36.8 C) (Oral)   Resp 16   Ht 6' (1.829 m)   Wt 188 lb 9.6 oz (85.5 kg)   SpO2 100%   BMI 25.58 kg/m       Objective:   Physical Exam  General Appearance- Not in acute distress.  HEENT Eyes- Scleraeral/Conjuntiva-bilat- Not Yellow. Mouth & Throat- Normal.  Chest and Lung Exam Auscultation: Breath sounds:-Normal. Adventitious sounds:- No Adventitious sounds.  Cardiovascular Auscultation:Rythm - Regular. Heart Sounds -Normal heart sounds.  Abdomen Inspection:-Inspection Normal.  Palpation/Perucssion: Palpation and Percussion of the abdomen reveal- mild epigsstric Tenderness, No Rebound tenderness, No rigidity(Guarding) and No Palpable abdominal masses.  Liver:-Normal.  Spleen:- Normal.   Back- no cva tenderness.      Assessment & Plan:  For your history of episodes epigastric pain and vomiting which which required 2 hospitalizations, I am prescribing omeprazole, Carafate and Zofran.  The hospital wrote you for Reglan and would recommend you continue that.  I  went ahead and put in referral for GI.  Referral would be for screening colonoscopy and likely EGD.  Please get CBC, CMP, amylase, lipase, and H. pylori breath test today.  Continue current medications for your chronic medical problems.  We will write you a work note to be off for 1 week.  We will see how you respond to the above and is able to go back to work.  If you are unable to go back to work then will extend work note pending GI evaluation.  During the interim if you have any severe recurrent symptoms recommend ED evaluation again.  Follow-up in 7 to 10 days or as needed.  During the interim if any severe onset of abdomen pain with vomiting then recommend ED evaluation.  Pt visit took longer than usual as had to review 2 hospitalization notes, her most recent pcp  Note get history and address her abdomen pain that continues.  Mackie Pai, PA-C

## 2017-07-20 NOTE — Patient Instructions (Signed)
For your history of episodes epigastric pain and vomiting which which required 2 hospitalizations, I am prescribing omeprazole, Carafate and Zofran.  The hospital wrote you for Reglan and would recommend you continue that.  I went ahead and put in referral for GI.  Referral would be for screening colonoscopy and likely EGD.  Please get CBC, CMP, amylase, lipase, and H. pylori breath test today.  Continue current medications for your chronic medical problems.  We will write you a work note to be off for 1 week.  We will see how you respond to the above and is able to go back to work.  If you are unable to go back to work then will extend work note pending GI evaluation.  During the interim if you have any severe recurrent symptoms recommend ED evaluation again.  Follow-up in 7 to 10 days or as needed.  During the interim if any severe onset of abdomen pain with vomiting then recommend ED evaluation.

## 2017-07-21 LAB — H. PYLORI BREATH TEST: H. PYLORI BREATH TEST: NOT DETECTED

## 2017-07-22 ENCOUNTER — Telehealth: Payer: Self-pay | Admitting: *Deleted

## 2017-07-22 NOTE — Telephone Encounter (Signed)
Received FMLA/STD paperwork from [employer] reqeusting more information and OV notes, attached to paperwork; forwarded to provider/SLS 06/12

## 2017-07-27 ENCOUNTER — Telehealth: Payer: Self-pay | Admitting: Medical

## 2017-07-27 ENCOUNTER — Ambulatory Visit (INDEPENDENT_AMBULATORY_CARE_PROVIDER_SITE_OTHER): Payer: 59 | Admitting: Medical

## 2017-07-27 ENCOUNTER — Encounter: Payer: Self-pay | Admitting: Medical

## 2017-07-27 VITALS — BP 129/91 | HR 82 | Temp 97.9°F | Resp 16 | Ht 72.0 in | Wt 190.6 lb

## 2017-07-27 DIAGNOSIS — R1013 Epigastric pain: Secondary | ICD-10-CM | POA: Diagnosis not present

## 2017-07-27 MED ORDER — RANITIDINE HCL 150 MG PO CAPS: 150.0000 mg | ORAL_CAPSULE | Freq: Two times a day (BID) | ORAL | 0 refills | Status: DC

## 2017-07-27 NOTE — Progress Notes (Signed)
Subjective:    Patient ID: [name], female    DOB: 11-Sep-1965, 52 y.o.   MRN: [id number]  HPI  Pt in for follow up.  I wanted to get her in quickly with GI MD. She states overall she does feel a lot better.   She states combination of omeprazole, carafate and zofran has helped.  Pt did have reglan before she saw me.  Pain less much less intense and not as often. Pain is much less overall. But she still nausea. She states now nausea about 3 days out of 7 days. But previously was nausea about 5/7.  Pt is scheduled to see [facility] MD. June 28,2019.  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 Pt pain is less today but still feels level of pain and nausea still a distraction and would effect her quality of work.   Review of Systems  Constitutional: Negative for chills, fatigue and fever.  Respiratory: Negative for cough, chest tightness, shortness of breath and wheezing.   Cardiovascular: Negative for chest pain and palpitations.  Gastrointestinal: Positive for abdominal pain and nausea. Negative for abdominal distention, blood in stool, constipation, diarrhea and vomiting.  Genitourinary: Negative for dyspareunia, dysuria and enuresis.  Musculoskeletal: Negative for back pain.  Skin: Negative for rash.  Neurological: Negative for dizziness, seizures, speech difficulty, weakness and headaches.  Psychiatric/Behavioral: Negative for behavioral problems, confusion, dysphoric mood and self-injury.   Past Medical History:  Diagnosis Date  . Cervical cancer ([place])    had surgery in 2001.  . Coronary artery disease    30% lesions noted  . Depression   . Diabetes mellitus   . GERD (gastroesophageal reflux disease)   . Hyperlipidemia   . Hypertension    . Neuropathy      Social History   Socioeconomic History  . Marital status: Single    Spouse name: Not on file  . Number of children: Not on file  . Years of education: Not on file  . Highest education level: Not on file  Occupational History  . Not on file  Social Needs  . Financial resource strain: Not on file  . Food insecurity:    Worry: Not on file    Inability: Not on file  . Transportation needs:    Medical: Not on file    Non-medical: Not on file  Tobacco Use  . Smoking status: Never Smoker  . Smokeless tobacco: Never Used  Substance and Sexual Activity  . Alcohol use: Yes    Comment: occ. Very rare.   . Drug use: No  . Sexual activity: Not on file  Lifestyle  . Physical activity:    Days per week: Not on file    Minutes per session: Not on file  . Stress: Not on file  Relationships  . Social connections:    Talks on phone: Not on file    Gets together: Not on file    Attends religious service: Not on file    Active member of club or organization: Not on file    Attends meetings of clubs or organizations: Not on file    Relationship status: Not on file  . Intimate partner violence:    Fear of current or ex partner: Not  on file    Emotionally abused: Not on file    Physically abused: Not on file    Forced sexual activity: Not on file  Other Topics Concern  . Not on file  Social History Narrative  . Not on file    Past Surgical History:  Procedure Laterality Date  . ABDOMINAL HYSTERECTOMY     partial  . CHOLECYSTECTOMY      Family History  Problem Relation Age of Onset  . Diabetes Mother   . Diabetes Father     Allergies  Allergen Reactions  . Insulin Aspart Other (See Comments) and Swelling    adverse reaction to Novolog   . Latex Itching and Rash  . Morphine Itching and Rash    Current Outpatient Medications on File Prior to Visit  Medication Sig Dispense Refill  . Albuterol Sulfate (PROAIR RESPICLICK) 709 (90 Base) MCG/ACT AEPB  Inhale 2 puffs into the lungs at bedtime as needed for shortness of breath.    Marland Kitchen aspirin 81 MG tablet Take 81 mg by mouth daily.    Marland Kitchen atorvastatin (LIPITOR) 80 MG tablet Take 80 mg by mouth daily.    . carvedilol (COREG) 25 MG tablet Take 25 mg by mouth 2 (two) times daily with a meal.    . Cholecalciferol (VITAMIN D3) 400 units CAPS Take 400 mg by mouth daily.    . dapagliflozin propanediol (FARXIGA) 10 MG TABS tablet Take 10 mg by mouth daily.    . famotidine (PEPCID) 20 MG tablet Take 20 mg by mouth 2 (two) times daily.    . fluticasone furoate-vilanterol (BREO ELLIPTA) 100-25 MCG/INH AEPB Inhale 1 puff into the lungs daily.    Marland Kitchen gabapentin (NEURONTIN) 300 MG capsule Take 300 mg by mouth 4 (four) times daily as needed for pain.    Marland Kitchen insulin degludec (TRESIBA FLEXTOUCH) 100 UNIT/ML SOPN FlexTouch Pen Inject 40 Units into the skin daily at 10 pm.    . metoCLOPramide (REGLAN) 10 MG tablet Take 1 tablet (10 mg total) by mouth every 8 (eight) hours as needed for nausea. 20 tablet 0  . omeprazole (PRILOSEC) 20 MG capsule Take 1 capsule (20 mg total) by mouth daily. 30 capsule 3  . ondansetron (ZOFRAN ODT) 4 MG disintegrating tablet Take 1 tablet (4 mg total) by mouth every 8 (eight) hours as needed for nausea or vomiting. 20 tablet 0  . ondansetron (ZOFRAN) 4 MG tablet Take 1 tablet (4 mg total) by mouth every 6 (six) hours as needed for nausea. 20 tablet 0  . oxyCODONE (OXY IR/ROXICODONE) 5 MG immediate release tablet Take 5 mg by mouth every 12 (twelve) hours as needed.  0  . polyethylene glycol (MIRALAX / GLYCOLAX) packet Take 17 g by mouth daily. 14 each 0  . senna-docusate (SENOKOT-S) 8.6-50 MG tablet Take 2 tablets by mouth 2 (two) times daily. 60 tablet 1  . spironolactone (ALDACTONE) 25 MG tablet Take 25 mg by mouth daily.  1  . sucralfate (CARAFATE) 1 g tablet Take 1 tablet (1 g total) by mouth 4 (four) times daily -  with meals and at bedtime. 120 tablet 0  . tiZANidine (ZANAFLEX) 4 MG  tablet Take 4 mg by mouth every 6 (six) hours as needed for muscle spasms.    . traMADol (ULTRAM) 50 MG tablet Take 1 tablet (50 mg total) by mouth every 6 (six) hours as needed for moderate pain. 12 tablet 0  . fluticasone (FLONASE) 50 MCG/ACT nasal spray Place 2 sprays  into the nose daily. 16 g 0   No current facility-administered medications on file prior to visit.     BP (!) 129/91   Pulse 82   Temp 97.9 F (36.6 C) (Oral)   Resp 16   Ht 6' (1.829 m)   Wt 190 lb 9.6 oz (86.5 kg)   SpO2 100%   BMI 25.85 kg/m       Objective:   Physical Exam  General Mental Status- Alert. General Appearance- Not in acute distress.   Skin General: Color- Normal Color. Moisture- Normal Moisture.  Neck Carotid Arteries- Normal color. Moisture- Normal Moisture. No carotid bruits. No JVD.  Chest and Lung Exam Auscultation: Breath Sounds:-Normal.  Cardiovascular Auscultation:Rythm- Regular. Murmurs & Other Heart Sounds:Auscultation of the heart reveals- No Murmurs.  Abdomen Inspection:-Inspeection Normal. Palpation/Percussion:Note:No mass. Palpation and Percussion of the abdomen reveal- moderate epigastric Tender, Non Distended + BS, no rebound or guarding.  Neurologic Cranial Nerve exam:- CN III-XII intact(No nystagmus), symmetric smile. Strength:- 5/5 equal and symmetric strength both upper and lower extremities.      Assessment & Plan:  For abd pain continue current meds and adding ranitidine.  See GI MD on June 28,2019. I am giving copy of [employer] form for him to fill out. I filled out form today pending GI MD opinion.  I am giving copy of form I filled out today for staff member to fax today.  Follow up date to be determined following GI appointment.   If signs symptoms worsen or change let me know. If severe then recommend ED evaluation again.  Mackie Pai, PA-C

## 2017-07-27 NOTE — Telephone Encounter (Signed)
Giving forms [employer] to Ivin Booty today to fax.

## 2017-07-27 NOTE — Patient Instructions (Addendum)
For abdomen pain continue current meds and adding ranitidine.  See GI MD on June 28,2019. I am giving copy of [employer] form for him to fill out. I filled out form today pending GI MD opinion.  I am giving copy of form I filled out today to staff member to fax today.  Follow up date to be determined following GI appointment.   If signs symptoms worsen or change let me know. If severe then recommend ED evaluation again.

## 2017-08-17 ENCOUNTER — Telehealth: Payer: Self-pay

## 2017-08-17 NOTE — Telephone Encounter (Signed)
Copied from [place] [phone]. Topic: General - Other >> Aug 17, 2017  1:37 PM Bea Graff, NT wrote: Reason for CRM: Pt states she needs a note that she may return back to work but with a weight lifting limit of 30 lbs. She also states she needs the note to state she cannot work past 8 hours. Her job has went to mandatory 8 hours and she does not think she can work that long.  Pt would like the note faxed to [phone] Attn: Durwin Nora. Please call pt once sent.

## 2017-08-17 NOTE — Telephone Encounter (Signed)
Patient needs to be scheduled for office visit.  I believe she has had limited duty in the past or has been off for certain number of days.  Also need to review her gastroenterologist work-up.  When she comes in for appointment would like to have that to review.  It has been 3 weeks and she needs a follow-up to determine what level of work she could do.  In addition the request to return to work duty  appears to contradict what the patient thinks that she is able to do.  In any event I need to assess her condition and document what I think she is able to do.  And write the appropriate note.  Please get her scheduled this week.

## 2017-08-18 NOTE — Telephone Encounter (Signed)
Pt scheduled for a visit tomorrow

## 2017-08-19 ENCOUNTER — Encounter: Payer: Self-pay | Admitting: Medical

## 2017-08-19 ENCOUNTER — Ambulatory Visit (INDEPENDENT_AMBULATORY_CARE_PROVIDER_SITE_OTHER): Payer: 59 | Admitting: Medical

## 2017-08-19 VITALS — BP 140/89 | HR 84 | Temp 98.1°F | Resp 16 | Ht 72.0 in | Wt 196.6 lb

## 2017-08-19 DIAGNOSIS — R1013 Epigastric pain: Secondary | ICD-10-CM

## 2017-08-19 DIAGNOSIS — I10 Essential (primary) hypertension: Secondary | ICD-10-CM | POA: Diagnosis not present

## 2017-08-19 DIAGNOSIS — R51 Headache: Secondary | ICD-10-CM | POA: Diagnosis not present

## 2017-08-19 DIAGNOSIS — R519 Headache, unspecified: Secondary | ICD-10-CM

## 2017-08-19 DIAGNOSIS — R55 Syncope and collapse: Secondary | ICD-10-CM

## 2017-08-19 LAB — CBC WITH DIFFERENTIAL/PLATELET
BASOS PCT: 1.2 % (ref 0.0–3.0)
Basophils Absolute: 0.1 10*3/uL (ref 0.0–0.1)
EOS PCT: 6.7 % — AB (ref 0.0–5.0)
Eosinophils Absolute: 0.4 10*3/uL (ref 0.0–0.7)
HEMATOCRIT: 39.5 % (ref 36.0–46.0)
Hemoglobin: 13.1 g/dL (ref 12.0–15.0)
LYMPHS PCT: 37.5 % (ref 12.0–46.0)
Lymphs Abs: 2.1 10*3/uL (ref 0.7–4.0)
MCHC: 33.1 g/dL (ref 30.0–36.0)
MCV: 89.1 fl (ref 78.0–100.0)
MONOS PCT: 8.6 % (ref 3.0–12.0)
Monocytes Absolute: 0.5 10*3/uL (ref 0.1–1.0)
NEUTROS ABS: 2.6 10*3/uL (ref 1.4–7.7)
Neutrophils Relative %: 46 % (ref 43.0–77.0)
PLATELETS: 244 10*3/uL (ref 150.0–400.0)
RBC: 4.43 Mil/uL (ref 3.87–5.11)
RDW: 14.9 % (ref 11.5–15.5)
WBC: 5.7 10*3/uL (ref 4.0–10.5)

## 2017-08-19 LAB — COMPREHENSIVE METABOLIC PANEL
ALT: 27 U/L (ref 0–35)
AST: 22 U/L (ref 0–37)
Albumin: 4.1 g/dL (ref 3.5–5.2)
Alkaline Phosphatase: 123 U/L — ABNORMAL HIGH (ref 39–117)
BILIRUBIN TOTAL: 0.3 mg/dL (ref 0.2–1.2)
BUN: 20 mg/dL (ref 6–23)
CALCIUM: 9.3 mg/dL (ref 8.4–10.5)
CHLORIDE: 100 meq/L (ref 96–112)
CO2: 30 meq/L (ref 19–32)
Creatinine, Ser: 1.21 mg/dL — ABNORMAL HIGH (ref 0.40–1.20)
GFR: 59.94 mL/min — AB (ref >60.00)
Glucose, Bld: 217 mg/dL — ABNORMAL HIGH (ref 70–99)
Potassium: 4.4 mEq/L (ref 3.5–5.1)
Sodium: 137 mEq/L (ref 135–145)
Total Protein: 7.4 g/dL (ref 6.0–8.3)

## 2017-08-19 MED ORDER — ATORVASTATIN CALCIUM 80 MG PO TABS: 80.0000 mg | ORAL_TABLET | Freq: Every day | ORAL | 11 refills | Status: DC

## 2017-08-19 MED ORDER — CARVEDILOL 25 MG PO TABS: 25.0000 mg | ORAL_TABLET | Freq: Two times a day (BID) | ORAL | 11 refills | Status: DC

## 2017-08-19 NOTE — Progress Notes (Signed)
Subjective:    Patient ID: [name], female    DOB: [birth year], 52 y.o.   MRN: [id number]  HPI  Pt in for follow up.  Pt states she did see GI MD with [facility].  Pt had ct scan was negative in past. This Friday she will have both colonoscopy and EGD.  Pt states GI MD gave her doxepan to start.  GI MD at this point considering pain in abdomen may be functional.    2014 she had upper endoscopy, CT scan, gastric emptying scan, and MRCP which will described as nondiagnostic/normal.  Pt had some time off in recent past. Pt has gone back to work. She was released back to work by Dr. Sherrell Puller. Pt states her company has gone to 10-12 hours shifts. Pt does not want to start back to work with long shifts. She states if pain becomes more intense or if she has to stop working then time will be counted against her and she might be at jeopardy of loosing job if gains too many points.  Her pain is still present 3/10. Less intense and not constant either. She wants to go back with 8 hour shifts.    Review of Systems  Constitutional: Negative for chills, diaphoresis, fatigue and fever.  Respiratory: Negative for chest tightness, shortness of breath and stridor.   Cardiovascular: Negative for chest pain and palpitations.  Gastrointestinal: Positive for abdominal pain. Negative for abdominal distention, blood in stool, constipation, diarrhea and vomiting.  Musculoskeletal: Negative for back pain, gait problem, joint swelling, myalgias and neck stiffness.  Skin: Negative for rash.  Neurological: Positive for syncope and headaches. Negative for dizziness, tremors, weakness, light-headedness and numbness.       Woke up in am. Early 1 am in morning on sunday. Went to bathroom. Then found self on floor when she woke up. Landed on laminate floor. No ha, no loss of bladder function. No preceding chest pain or palpation. First time this happened in life. Has not been evaluated. No reoccurence.  Rare  occasional ha. But none presently.  Hematological: Negative for adenopathy. Does not bruise/bleed easily.  Psychiatric/Behavioral: Negative for behavioral problems, confusion and suicidal ideas. The patient is not nervous/anxious.     Past Medical History:  Diagnosis Date  . Cervical cancer ([place])    had surgery in 2001.  . Coronary artery disease    30% lesions noted  . Depression   . Diabetes mellitus   . GERD (gastroesophageal reflux disease)   . Hyperlipidemia   . Hypertension   . Neuropathy      Social History   Socioeconomic History  . Marital status: Single    Spouse name: Not on file  . Number of children: Not on file  . Years of education: Not on file  . Highest education level: Not on file  Occupational History  . Not on file  Social Needs  . Financial resource strain: Not on file  . Food insecurity:    Worry: Not on file    Inability: Not on file  . Transportation needs:    Medical: Not on file    Non-medical: Not on file  Tobacco Use  . Smoking status: Never Smoker  . Smokeless tobacco: Never Used  Substance and Sexual Activity  . Alcohol use: Yes    Comment: occ. Very rare.   . Drug use: No  . Sexual activity: Not on file  Lifestyle  . Physical activity:    Days per week:  Not on file    Minutes per session: Not on file  . Stress: Not on file  Relationships  . Social connections:    Talks on phone: Not on file    Gets together: Not on file    Attends religious service: Not on file    Active member of club or organization: Not on file    Attends meetings of clubs or organizations: Not on file    Relationship status: Not on file  . Intimate partner violence:    Fear of current or ex partner: Not on file    Emotionally abused: Not on file    Physically abused: Not on file    Forced sexual activity: Not on file  Other Topics Concern  . Not on file  Social History Narrative  . Not on file    Past Surgical History:  Procedure Laterality Date   . ABDOMINAL HYSTERECTOMY     partial  . CHOLECYSTECTOMY      Family History  Problem Relation Age of Onset  . Diabetes Mother   . Diabetes Father     Allergies  Allergen Reactions  . Insulin Aspart Other (See Comments) and Swelling    adverse reaction to Novolog   . Latex Itching and Rash  . Morphine Itching and Rash    Current Outpatient Medications on File Prior to Visit  Medication Sig Dispense Refill  . Albuterol Sulfate (PROAIR RESPICLICK) 503 (90 Base) MCG/ACT AEPB Inhale 2 puffs into the lungs at bedtime as needed for shortness of breath.    Marland Kitchen aspirin 81 MG tablet Take 81 mg by mouth daily.    Marland Kitchen atorvastatin (LIPITOR) 80 MG tablet Take 80 mg by mouth daily.    . carvedilol (COREG) 25 MG tablet Take 25 mg by mouth 2 (two) times daily with a meal.    . Cholecalciferol (VITAMIN D3) 400 units CAPS Take 400 mg by mouth daily.    . dapagliflozin propanediol (FARXIGA) 10 MG TABS tablet Take 10 mg by mouth daily.    . fluticasone furoate-vilanterol (BREO ELLIPTA) 100-25 MCG/INH AEPB Inhale 1 puff into the lungs daily.    Marland Kitchen gabapentin (NEURONTIN) 300 MG capsule Take 300 mg by mouth 4 (four) times daily as needed for pain.    Marland Kitchen insulin degludec (TRESIBA FLEXTOUCH) 100 UNIT/ML SOPN FlexTouch Pen Inject 40 Units into the skin daily at 10 pm.    . metoCLOPramide (REGLAN) 10 MG tablet Take 1 tablet (10 mg total) by mouth every 8 (eight) hours as needed for nausea. 20 tablet 0  . omeprazole (PRILOSEC) 20 MG capsule Take 1 capsule (20 mg total) by mouth daily. 30 capsule 3  . ondansetron (ZOFRAN ODT) 4 MG disintegrating tablet Take 1 tablet (4 mg total) by mouth every 8 (eight) hours as needed for nausea or vomiting. 20 tablet 0  . ondansetron (ZOFRAN) 4 MG tablet Take 1 tablet (4 mg total) by mouth every 6 (six) hours as needed for nausea. 20 tablet 0  . oxyCODONE (OXY IR/ROXICODONE) 5 MG immediate release tablet Take 5 mg by mouth every 12 (twelve) hours as needed.  0  . polyethylene  glycol (MIRALAX / GLYCOLAX) packet Take 17 g by mouth daily. 14 each 0  . ranitidine (ZANTAC) 150 MG capsule Take 1 capsule (150 mg total) by mouth 2 (two) times daily. 60 capsule 0  . senna-docusate (SENOKOT-S) 8.6-50 MG tablet Take 2 tablets by mouth 2 (two) times daily. 60 tablet 1  . spironolactone (ALDACTONE)  25 MG tablet Take 25 mg by mouth daily.  1  . sucralfate (CARAFATE) 1 g tablet Take 1 tablet (1 g total) by mouth 4 (four) times daily -  with meals and at bedtime. 120 tablet 0  . tiZANidine (ZANAFLEX) 4 MG tablet Take 4 mg by mouth every 6 (six) hours as needed for muscle spasms.    . traMADol (ULTRAM) 50 MG tablet Take 1 tablet (50 mg total) by mouth every 6 (six) hours as needed for moderate pain. 12 tablet 0  . famotidine (PEPCID) 20 MG tablet Take 20 mg by mouth 2 (two) times daily.    . fluticasone (FLONASE) 50 MCG/ACT nasal spray Place 2 sprays into the nose daily. 16 g 0   No current facility-administered medications on file prior to visit.     BP (!) 130/91   Pulse 84   Temp 98.1 F (36.7 C) (Oral)   Resp 16   Ht 6' (1.829 m)   Wt 196 lb 9.6 oz (89.2 kg)   SpO2 100%   BMI 26.66 kg/m       Objective:   Physical Exam  General Appearance- Not in acute distress.  HEENT Eyes- Scleraeral/Conjuntiva-bilat- Not Yellow. Mouth & Throat- Normal.  Chest and Lung Exam Auscultation: Breath sounds:-Normal. Adventitious sounds:- No Adventitious sounds.  Cardiovascular Auscultation:Rythm - Regular. Heart Sounds -Normal heart sounds.  Abdomen Inspection:-Inspection Normal.  Palpation/Perucssion: Palpation and Percussion of the abdomen reveal- Non Tender, No Rebound tenderness, No rigidity(Guarding) and No Palpable abdominal masses.  Liver:-Normal.  Spleen:- Normal.   Past Medical History:  Diagnosis Date  . Cervical cancer ([place])    had surgery in 2001.  . Coronary artery disease    30% lesions noted  . Depression   . Diabetes mellitus   . GERD  (gastroesophageal reflux disease)   . Hyperlipidemia   . Hypertension   . Neuropathy      Social History   Socioeconomic History  . Marital status: Single    Spouse name: Not on file  . Number of children: Not on file  . Years of education: Not on file  . Highest education level: Not on file  Occupational History  . Not on file  Social Needs  . Financial resource strain: Not on file  . Food insecurity:    Worry: Not on file    Inability: Not on file  . Transportation needs:    Medical: Not on file    Non-medical: Not on file  Tobacco Use  . Smoking status: Never Smoker  . Smokeless tobacco: Never Used  Substance and Sexual Activity  . Alcohol use: Yes    Comment: occ. Very rare.   . Drug use: No  . Sexual activity: Not on file  Lifestyle  . Physical activity:    Days per week: Not on file    Minutes per session: Not on file  . Stress: Not on file  Relationships  . Social connections:    Talks on phone: Not on file    Gets together: Not on file    Attends religious service: Not on file    Active member of club or organization: Not on file    Attends meetings of clubs or organizations: Not on file    Relationship status: Not on file  . Intimate partner violence:    Fear of current or ex partner: Not on file    Emotionally abused: Not on file    Physically abused: Not on file  Forced sexual activity: Not on file  Other Topics Concern  . Not on file  Social History Narrative  . Not on file    Past Surgical History:  Procedure Laterality Date  . ABDOMINAL HYSTERECTOMY     partial  . CHOLECYSTECTOMY      Family History  Problem Relation Age of Onset  . Diabetes Mother   . Diabetes Father     Allergies  Allergen Reactions  . Insulin Aspart Other (See Comments) and Swelling    adverse reaction to Novolog   . Latex Itching and Rash  . Morphine Itching and Rash    Current Outpatient Medications on File Prior to Visit  Medication Sig Dispense  Refill  . Albuterol Sulfate (PROAIR RESPICLICK) 542 (90 Base) MCG/ACT AEPB Inhale 2 puffs into the lungs at bedtime as needed for shortness of breath.    Marland Kitchen aspirin 81 MG tablet Take 81 mg by mouth daily.    Marland Kitchen atorvastatin (LIPITOR) 80 MG tablet Take 80 mg by mouth daily.    . carvedilol (COREG) 25 MG tablet Take 25 mg by mouth 2 (two) times daily with a meal.    . Cholecalciferol (VITAMIN D3) 400 units CAPS Take 400 mg by mouth daily.    . dapagliflozin propanediol (FARXIGA) 10 MG TABS tablet Take 10 mg by mouth daily.    . fluticasone furoate-vilanterol (BREO ELLIPTA) 100-25 MCG/INH AEPB Inhale 1 puff into the lungs daily.    Marland Kitchen gabapentin (NEURONTIN) 300 MG capsule Take 300 mg by mouth 4 (four) times daily as needed for pain.    Marland Kitchen insulin degludec (TRESIBA FLEXTOUCH) 100 UNIT/ML SOPN FlexTouch Pen Inject 40 Units into the skin daily at 10 pm.    . metoCLOPramide (REGLAN) 10 MG tablet Take 1 tablet (10 mg total) by mouth every 8 (eight) hours as needed for nausea. 20 tablet 0  . omeprazole (PRILOSEC) 20 MG capsule Take 1 capsule (20 mg total) by mouth daily. 30 capsule 3  . ondansetron (ZOFRAN ODT) 4 MG disintegrating tablet Take 1 tablet (4 mg total) by mouth every 8 (eight) hours as needed for nausea or vomiting. 20 tablet 0  . ondansetron (ZOFRAN) 4 MG tablet Take 1 tablet (4 mg total) by mouth every 6 (six) hours as needed for nausea. 20 tablet 0  . oxyCODONE (OXY IR/ROXICODONE) 5 MG immediate release tablet Take 5 mg by mouth every 12 (twelve) hours as needed.  0  . polyethylene glycol (MIRALAX / GLYCOLAX) packet Take 17 g by mouth daily. 14 each 0  . ranitidine (ZANTAC) 150 MG capsule Take 1 capsule (150 mg total) by mouth 2 (two) times daily. 60 capsule 0  . senna-docusate (SENOKOT-S) 8.6-50 MG tablet Take 2 tablets by mouth 2 (two) times daily. 60 tablet 1  . spironolactone (ALDACTONE) 25 MG tablet Take 25 mg by mouth daily.  1  . sucralfate (CARAFATE) 1 g tablet Take 1 tablet (1 g total)  by mouth 4 (four) times daily -  with meals and at bedtime. 120 tablet 0  . tiZANidine (ZANAFLEX) 4 MG tablet Take 4 mg by mouth every 6 (six) hours as needed for muscle spasms.    . traMADol (ULTRAM) 50 MG tablet Take 1 tablet (50 mg total) by mouth every 6 (six) hours as needed for moderate pain. 12 tablet 0  . famotidine (PEPCID) 20 MG tablet Take 20 mg by mouth 2 (two) times daily.    . fluticasone (FLONASE) 50 MCG/ACT nasal spray Place 2  sprays into the nose daily. 16 g 0   No current facility-administered medications on file prior to visit.     BP (!) 130/91   Pulse 84   Temp 98.1 F (36.7 C) (Oral)   Resp 16   Ht 6' (1.829 m)   Wt 196 lb 9.6 oz (89.2 kg)   SpO2 100%   BMI 26.66 kg/m   General Appearance- Not in acute distress.  HEENT Eyes- Scleraeral/Conjuntiva-bilat- Not Yellow. Mouth & Throat- Normal.  Chest and Lung Exam Auscultation: Breath sounds:-Normal. Adventitious sounds:- No Adventitious sounds.  Cardiovascular Auscultation:Rythm - Regular. Heart Sounds -Normal heart sounds.  Abdomen Inspection:-Inspection Normal.  Palpation/Perucssion: Palpation and Percussion of the abdomen reveal- Non Tender, No Rebound tenderness, No rigidity(Guarding) and No Palpable abdominal masses.  Liver:-Normal.  Spleen:- Normal.   General Mental Status- Alert. General Appearance- Not in acute distress.   Skin General: Color- Normal Color. Moisture- Normal Moisture.  Neck Carotid Arteries- Normal color. Moisture- Normal Moisture. No carotid bruits. No JVD.  Chest and Lung Exam Auscultation: Breath Sounds:-Normal.  Cardiovascular Auscultation:Rythm- Regular. Murmurs & Other Heart Sounds:Auscultation of the heart reveals- No Murmurs.  Abdomen Inspection:-Inspeection Normal. Palpation/Percussion:Note:No mass. Palpation and Percussion of the abdomen reveal- faint epigastric  Tender, Non Distended + BS, no rebound or guarding.    Neurologic Cranial Nerve exam:- CN  III-XII intact(No nystagmus), symmetric smile. Finger to Nose:- Normal/Intact Strength:- 5/5 equal and symmetric strength both upper and lower extremities.  Face- no battles signs. No swelling. Only faint tender rt eye brow area.      Assessment & Plan:  For you epigastric pain, continue meds advised by GI MD. Follow through with egd and colonoscopy.  Continue current bp meds and check bp levels at home. If bp is 140/90 or higher will need to add other bp med.   For your recent episode of syncope and intermittent rare headache, we did EKG.  That EKG.  Normal sinus rhythm.  Also I am placing order for CT of the head without contrast.  You should be contacted regarding scheduling for that.  That will be done within the next week.  Please get CBC and metabolic panel today.  If you get any recurrent syncopal type episode then please be seen at the emergency department.  Syncope work-up should be done as closely to the event as possible.  You mentioned brief intermittent mild headaches.  Please start to keep headache diary as to the duration, associated signs symptoms and medication effects to stop these intermittent mild headaches.  If any recurrent syncopal episodes occur then will need to consider referral to cardiologist and/or neurologist.  Follow-up in 2 weeks or as needed.  More than 40 minutes was spent with patient today.  50% of time spent with counseling regarding abdomen pain, hypertension and work-up for syncope.

## 2017-08-19 NOTE — Patient Instructions (Addendum)
For you epigastric pain, continue meds advised by GI MD. Follow through with egd and colonoscopy.  Continue current bp meds and check bp levels at home. If bp is 140/90 or higher will need to add other bp med.   For your recent episode of syncope and intermittent rare headache, we did EKG.  That EKG.  Normal sinus rhythm.  Also I am placing order for CT of the head without contrast.  You should be contacted regarding scheduling for that.  That will be done within the next week.  Please get CBC and metabolic panel today.  If you get any recurrent syncopal type episode then please be seen at the emergency department.  Syncope work-up should be done as closely to the event as possible.  You mentioned brief intermittent mild headaches.  Please start to keep headache diary as to the duration, associated signs symptoms and medication effects to stop these intermittent mild headaches.  If any recurrent syncopal episodes occur then will need to consider referral to cardiologist and/or neurologist.  Follow-up in 2 weeks or as needed.

## 2017-08-20 ENCOUNTER — Ambulatory Visit (HOSPITAL_BASED_OUTPATIENT_CLINIC_OR_DEPARTMENT_OTHER): Admission: RE | Admit: 2017-08-20 | Payer: 59 | Source: Ambulatory Visit

## 2017-08-22 ENCOUNTER — Ambulatory Visit (HOSPITAL_BASED_OUTPATIENT_CLINIC_OR_DEPARTMENT_OTHER)
Admission: RE | Admit: 2017-08-22 | Discharge: 2017-08-22 | Disposition: A | Discharge status: Home / Self Care | Payer: 59 | Source: Ambulatory Visit | Attending: Medical | Admitting: Medical

## 2017-08-22 DIAGNOSIS — R51 Headache: Secondary | ICD-10-CM | POA: Insufficient documentation

## 2017-08-22 DIAGNOSIS — R55 Syncope and collapse: Secondary | ICD-10-CM | POA: Diagnosis present

## 2017-08-22 DIAGNOSIS — R519 Headache, unspecified: Secondary | ICD-10-CM

## 2017-08-27 ENCOUNTER — Ambulatory Visit: Payer: 59 | Admitting: Medical

## 2017-08-27 DIAGNOSIS — Z0289 Encounter for other administrative examinations: Secondary | ICD-10-CM

## 2017-09-02 ENCOUNTER — Ambulatory Visit (INDEPENDENT_AMBULATORY_CARE_PROVIDER_SITE_OTHER): Payer: 59 | Admitting: Medical

## 2017-09-02 ENCOUNTER — Encounter: Payer: Self-pay | Admitting: Medical

## 2017-09-02 VITALS — BP 145/92 | HR 83 | Temp 97.8°F | Resp 16 | Ht 72.0 in | Wt 195.2 lb

## 2017-09-02 DIAGNOSIS — I10 Essential (primary) hypertension: Secondary | ICD-10-CM

## 2017-09-02 DIAGNOSIS — K3184 Gastroparesis: Secondary | ICD-10-CM | POA: Diagnosis not present

## 2017-09-02 MED ORDER — AMLODIPINE BESYLATE 2.5 MG PO TABS: 2.5000 mg | ORAL_TABLET | Freq: Every day | ORAL | 0 refills | Status: DC

## 2017-09-02 NOTE — Patient Instructions (Signed)
  For your history of severe gastroparesis, I want you to increase the Reglan to 1 tablet every 8 hours over the next 3 to 4 days.  This does match the max frequency that the GI doctor wrote on the prescription.  Would recommend that you do a very small frequent meals as opposed to large meals.  If your symptoms worsen as you described recently then please let us know.  Any severe signs and symptoms change then recommend ED evaluation.  Please update me on this coming Monday how you are doing and update your GI MD as well.  See if he agrees with a continuous Reglan use every 8 hours.  Or does he have other potential regimen as add on to treatment.  Your blood pressure has been consistently a little on the high side.  I want you to continue current blood pressure medications and I am going to add amlodipine 2.5 mg to your regimen.  Check blood pressure daily and give me an update on blood pressure readings in about 7 to 10 days.  Follow-up in 7 to 10 days or as needed.

## 2017-09-02 NOTE — Progress Notes (Signed)
Subjective:    Patient ID: [name], female    DOB: [birth year], 52 y.o.   MRN: [id number]  HPI   Pt in for follow up.  Pt states she is still having abdomen pain. Past Wednesday she ate some food around 7 pm. She ate about 1/4 of a plate fish and cole slaw. Then 45 minute later stomach started hurting. Pain was moderate to severe 8 pm until around 1:30 am befor pain subsided.  Then Friday pain returned. By Saturday pain eventually decreased.  Pt had egd with GI. She only drank coffee for one day but on egd she was found to have food in her stomach.  Pt GI MD stated she has gastroparesis.   Pt is on doxepin and ranitidine.   Pt states today she ate rice and shrimp. 10 minutes later her stomach started hurting. Pt is on reglan only one tab a day. Instruction say tid as needed.  Pt has been using phenergan as well if needed for nausea or vomiting.  Pt blood pressure have been steady close to 140/90 daily. Pt on coreg and aldactone.    Review of Systems  Constitutional: Negative for chills, fatigue and fever.  HENT: Negative for congestion and drooling.   Respiratory: Negative for cough, chest tightness, shortness of breath and wheezing.   Gastrointestinal: Negative for abdominal distention, abdominal pain, constipation and nausea.       None presently but see hpi.  Genitourinary: Negative for difficulty urinating, dysuria, enuresis and flank pain.  Musculoskeletal: Negative for back pain.  Neurological: Negative for tremors, facial asymmetry, speech difficulty, weakness and light-headedness.  Hematological: Negative for adenopathy. Does not bruise/bleed easily.  Psychiatric/Behavioral: Negative for behavioral problems, confusion and dysphoric mood. The patient is not nervous/anxious and is not hyperactive.    Past Medical History:  Diagnosis Date  . Cervical cancer ([place])    had surgery in 2001.  . Coronary artery disease    30% lesions noted  . Depression   .  Diabetes mellitus   . GERD (gastroesophageal reflux disease)   . Hyperlipidemia   . Hypertension   . Neuropathy      Social History   Socioeconomic History  . Marital status: Single    Spouse name: Not on file  . Number of children: Not on file  . Years of education: Not on file  . Highest education level: Not on file  Occupational History  . Not on file  Social Needs  . Financial resource strain: Not on file  . Food insecurity:    Worry: Not on file    Inability: Not on file  . Transportation needs:    Medical: Not on file    Non-medical: Not on file  Tobacco Use  . Smoking status: Never Smoker  . Smokeless tobacco: Never Used  Substance and Sexual Activity  . Alcohol use: Yes    Comment: occ. Very rare.   . Drug use: No  . Sexual activity: Not on file  Lifestyle  . Physical activity:    Days per week: Not on file    Minutes per session: Not on file  . Stress: Not on file  Relationships  . Social connections:    Talks on phone: Not on file    Gets together: Not on file    Attends religious service: Not on file    Active member of club or organization: Not on file    Attends meetings of clubs or organizations: Not on  file    Relationship status: Not on file  . Intimate partner violence:    Fear of current or ex partner: Not on file    Emotionally abused: Not on file    Physically abused: Not on file    Forced sexual activity: Not on file  Other Topics Concern  . Not on file  Social History Narrative  . Not on file    Past Surgical History:  Procedure Laterality Date  . ABDOMINAL HYSTERECTOMY     partial  . CHOLECYSTECTOMY      Family History  Problem Relation Age of Onset  . Diabetes Mother   . Diabetes Father     Allergies  Allergen Reactions  . Insulin Aspart Other (See Comments) and Swelling    adverse reaction to Novolog   . Latex Itching and Rash  . Morphine Itching and Rash    Current Outpatient Medications on File Prior to Visit   Medication Sig Dispense Refill  . Albuterol Sulfate (PROAIR RESPICLICK) 962 (90 Base) MCG/ACT AEPB Inhale 2 puffs into the lungs at bedtime as needed for shortness of breath.    Marland Kitchen aspirin 81 MG tablet Take 81 mg by mouth daily.    Marland Kitchen atorvastatin (LIPITOR) 80 MG tablet Take 1 tablet (80 mg total) by mouth daily. 30 tablet 11  . carvedilol (COREG) 25 MG tablet Take 1 tablet (25 mg total) by mouth 2 (two) times daily with a meal. 60 tablet 11  . Cholecalciferol (VITAMIN D3) 400 units CAPS Take 400 mg by mouth daily.    . dapagliflozin propanediol (FARXIGA) 10 MG TABS tablet Take 10 mg by mouth daily.    . fluticasone furoate-vilanterol (BREO ELLIPTA) 100-25 MCG/INH AEPB Inhale 1 puff into the lungs daily.    Marland Kitchen gabapentin (NEURONTIN) 300 MG capsule Take 300 mg by mouth 4 (four) times daily as needed for pain.    Marland Kitchen insulin degludec (TRESIBA FLEXTOUCH) 100 UNIT/ML SOPN FlexTouch Pen Inject 40 Units into the skin daily at 10 pm.    . metoCLOPramide (REGLAN) 10 MG tablet Take 1 tablet (10 mg total) by mouth every 8 (eight) hours as needed for nausea. 20 tablet 0  . omeprazole (PRILOSEC) 20 MG capsule Take 1 capsule (20 mg total) by mouth daily. 30 capsule 3  . ondansetron (ZOFRAN ODT) 4 MG disintegrating tablet Take 1 tablet (4 mg total) by mouth every 8 (eight) hours as needed for nausea or vomiting. 20 tablet 0  . ondansetron (ZOFRAN) 4 MG tablet Take 1 tablet (4 mg total) by mouth every 6 (six) hours as needed for nausea. 20 tablet 0  . oxyCODONE (OXY IR/ROXICODONE) 5 MG immediate release tablet Take 5 mg by mouth every 12 (twelve) hours as needed.  0  . polyethylene glycol (MIRALAX / GLYCOLAX) packet Take 17 g by mouth daily. 14 each 0  . ranitidine (ZANTAC) 150 MG capsule Take 1 capsule (150 mg total) by mouth 2 (two) times daily. 60 capsule 0  . senna-docusate (SENOKOT-S) 8.6-50 MG tablet Take 2 tablets by mouth 2 (two) times daily. 60 tablet 1  . spironolactone (ALDACTONE) 25 MG tablet Take 25 mg  by mouth daily.  1  . sucralfate (CARAFATE) 1 g tablet Take 1 tablet (1 g total) by mouth 4 (four) times daily -  with meals and at bedtime. 120 tablet 0  . tiZANidine (ZANAFLEX) 4 MG tablet Take 4 mg by mouth every 6 (six) hours as needed for muscle spasms.    Marland Kitchen  traMADol (ULTRAM) 50 MG tablet Take 1 tablet (50 mg total) by mouth every 6 (six) hours as needed for moderate pain. 12 tablet 0  . famotidine (PEPCID) 20 MG tablet Take 20 mg by mouth 2 (two) times daily.    . fluticasone (FLONASE) 50 MCG/ACT nasal spray Place 2 sprays into the nose daily. 16 g 0   No current facility-administered medications on file prior to visit.     BP (!) 145/92   Pulse 83   Temp 97.8 F (36.6 C) (Oral)   Resp 16   Ht 6' (1.829 m)   Wt 195 lb 3.2 oz (88.5 kg)   SpO2 99%   BMI 26.47 kg/m       Objective:   Physical Exam  General Appearance- Not in acute distress.  HEENT Eyes- Scleraeral/Conjuntiva-bilat- Not Yellow. Mouth & Throat- Normal.  Chest and Lung Exam Auscultation: Breath sounds:-Normal. Adventitious sounds:- No Adventitious sounds.  Cardiovascular Auscultation:Rythm - Regular. Heart Sounds -Normal heart sounds.  Abdomen Inspection:-Inspection Normal.  Palpation/Perucssion: Palpation and Percussion of the abdomen reveal- Non Tender, No Rebound tenderness, No rigidity(Guarding) and No Palpable abdominal masses.  Liver:-Normal.  Spleen:- Normal.   Back- no cva pain      Assessment & Plan:  For your history of severe gastroparesis, I want you to increase the Reglan to 1 tablet every 8 hours over the next 3 to 4 days.  This does match the max frequency that the GI doctor wrote on the prescription.  Would recommend that you do a very small frequent meals as opposed to large meals.  If your symptoms worsen as you described recently then please let us know.  Any severe signs and symptoms change then recommend ED evaluation.  Please update me on this coming Monday how you are doing  and update your GI MD as well.  See if he agrees with a continuous Reglan use every 8 hours.  Or does he have other potential regimen as add on to treatment.  Your blood pressure has been consistently a little on the high side.  I want you to continue current blood pressure medications and I am going to add amlodipine 2.5 mg to your regimen.  Check blood pressure daily and give me an update on blood pressure readings in about 7 to 10 days.  Follow-up in 7 to 10 days or as needed.  Mackie Pai, PA-C

## 2017-09-10 ENCOUNTER — Encounter: Payer: Self-pay | Admitting: Medical

## 2017-09-10 ENCOUNTER — Ambulatory Visit (INDEPENDENT_AMBULATORY_CARE_PROVIDER_SITE_OTHER): Payer: 59 | Admitting: Medical

## 2017-09-10 VITALS — BP 150/95 | HR 81 | Temp 98.2°F | Resp 16 | Ht 72.0 in | Wt 202.2 lb

## 2017-09-10 DIAGNOSIS — M549 Dorsalgia, unspecified: Secondary | ICD-10-CM

## 2017-09-10 DIAGNOSIS — R109 Unspecified abdominal pain: Secondary | ICD-10-CM | POA: Diagnosis not present

## 2017-09-10 DIAGNOSIS — I10 Essential (primary) hypertension: Secondary | ICD-10-CM

## 2017-09-10 DIAGNOSIS — K3184 Gastroparesis: Secondary | ICD-10-CM

## 2017-09-10 LAB — CBC WITH DIFFERENTIAL/PLATELET
BASOS PCT: 0.4 % (ref 0.0–3.0)
Basophils Absolute: 0 10*3/uL (ref 0.0–0.1)
EOS PCT: 6.5 % — AB (ref 0.0–5.0)
Eosinophils Absolute: 0.4 10*3/uL (ref 0.0–0.7)
HCT: 37.2 % (ref 36.0–46.0)
Hemoglobin: 12.2 g/dL (ref 12.0–15.0)
LYMPHS ABS: 2 10*3/uL (ref 0.7–4.0)
Lymphocytes Relative: 35.3 % (ref 12.0–46.0)
MCHC: 32.7 g/dL (ref 30.0–36.0)
MCV: 89.4 fl (ref 78.0–100.0)
MONOS PCT: 8.5 % (ref 3.0–12.0)
Monocytes Absolute: 0.5 10*3/uL (ref 0.1–1.0)
NEUTROS ABS: 2.7 10*3/uL (ref 1.4–7.7)
NEUTROS PCT: 49.3 % (ref 43.0–77.0)
PLATELETS: 275 10*3/uL (ref 150.0–400.0)
RBC: 4.16 Mil/uL (ref 3.87–5.11)
RDW: 14.5 % (ref 11.5–15.5)
WBC: 5.5 10*3/uL (ref 4.0–10.5)

## 2017-09-10 LAB — COMPREHENSIVE METABOLIC PANEL
ALBUMIN: 3.9 g/dL (ref 3.5–5.2)
ALK PHOS: 106 U/L (ref 39–117)
ALT: 25 U/L (ref 0–35)
AST: 29 U/L (ref 0–37)
BUN: 13 mg/dL (ref 6–23)
CHLORIDE: 102 meq/L (ref 96–112)
CO2: 31 mEq/L (ref 19–32)
CREATININE: 1.05 mg/dL (ref 0.40–1.20)
Calcium: 9.5 mg/dL (ref 8.4–10.5)
GFR: 70.58 mL/min (ref >60.00)
Glucose, Bld: 82 mg/dL (ref 70–99)
Potassium: 4 mEq/L (ref 3.5–5.1)
SODIUM: 138 meq/L (ref 135–145)
TOTAL PROTEIN: 7.5 g/dL (ref 6.0–8.3)
Total Bilirubin: 0.4 mg/dL (ref 0.2–1.2)

## 2017-09-10 LAB — AMYLASE: AMYLASE: 94 U/L (ref 27–131)

## 2017-09-10 LAB — LIPASE: Lipase: 30 U/L (ref 11.0–59.0)

## 2017-09-10 MED ORDER — FAMCICLOVIR 500 MG PO TABS: 500.0000 mg | ORAL_TABLET | Freq: Three times a day (TID) | ORAL | 0 refills | Status: DC

## 2017-09-10 MED ORDER — KETOROLAC TROMETHAMINE 30 MG/ML IJ SOLN
30.0000 mg | Freq: Once | INTRAMUSCULAR | Status: AC
  Administered 2017-09-10: 30 mg via INTRAMUSCULAR

## 2017-09-10 MED ORDER — AMLODIPINE BESYLATE 5 MG PO TABS: 5.0000 mg | ORAL_TABLET | Freq: Every day | ORAL | 0 refills | Status: DC

## 2017-09-10 NOTE — Patient Instructions (Signed)
For your recent right side CVA region pain and right side abdomen pain, I want to get a CBC, CMP, amylase and lipase.  Your urine test today looked clear.  No indicators of infection and no blood in the urine.  Urine test was done to see if possible UTI or blood present which could indicate possible kidney stone.  With absence of blood this decrease the chance of kidney stone.  We will follow labs and see if there is any abnormality.  Pain could be muscular.  We will give you low-dose Toradol 30 mg and see how you respond.  You have oxycodone available for your chronic back pain and this could help you deal with the current knee pain as well.  However we are approaching the weekend and I want to see how you do with the injection and follow lab results.  Please give me an update by tomorrow morning how you are doing.  If your pain is not decreased significantly then I will go ahead and do abdomen and pelvis CT.  If I did order would likely due to renal stone protocol.  Today appears that your pain associated with gastroparesis has improved with Reglan.  Continue current dosing regimen.  Your blood pressure has been elevated the last couple times that I have seen you.  I want you to go ahead and start amlodipine 5 mg tablets daily and continue other blood pressure medications the same.  Some of your pain in the back and abdomen is on same dermatome level.  You had no rash consistent with shingles.  However this is in the differential diagnosis.  I am going to go ahead and give you a print prescription of Famvir in the event that you get rash that is consistent with shingles.  If that does occur then start Famvir immediately.  Follow-up in 7 days or as needed.

## 2017-09-10 NOTE — Progress Notes (Signed)
Subjective:    Patient ID: [name], female    DOB: 1965-07-23, 52 y.o.   MRN: [id number]  HPI  Pt in stating she has been feeling some better since I last saw her.   I had advised increasing her reglan. She states the pain directly in epigastric area after eating seems to have eased up. She also is purposely eating less to help decrease pain. She has known gastroparesis. Pt has follow up with specialist in 3 months.   But on Saturday around 10 am had some pain in her cva region and rt side abd/flank. By Sunday pain went away. Then yesterday the pain returned. Pain present without movement. She states the pain 6-7/10 pain. No rash on skin. Pain feels deep. Not constipated. No fever, no nausea, no vomiting and no urinary tract infection symptoms.  Pt had gallbadder removed in past. Still has appendix.     Review of Systems  Constitutional: Negative for chills, fatigue and fever.  Respiratory: Negative for cough, chest tightness, shortness of breath and wheezing.   Cardiovascular: Negative for chest pain and palpitations.  Gastrointestinal: Positive for abdominal pain.       Some rt of umbilcus corresponding with rt cva area pain.  Musculoskeletal: Positive for back pain.  Skin: Negative for rash.  Neurological: Negative for dizziness, seizures, syncope, weakness and light-headedness.       Low level frontal ha today. Pt has not been checking her blood pressure.   Hematological: Negative for adenopathy. Does not bruise/bleed easily.  Psychiatric/Behavioral: Negative for behavioral problems and confusion.    Past Medical History:  Diagnosis Date  . Cervical cancer ([place])    had surgery in 2001.  . Coronary artery disease    30% lesions noted  . Depression   . Diabetes mellitus   . GERD (gastroesophageal reflux disease)   . Hyperlipidemia   . Hypertension   . Neuropathy      Social History   Socioeconomic History  . Marital status: Single    Spouse name: Not  on file  . Number of children: Not on file  . Years of education: Not on file  . Highest education level: Not on file  Occupational History  . Not on file  Social Needs  . Financial resource strain: Not on file  . Food insecurity:    Worry: Not on file    Inability: Not on file  . Transportation needs:    Medical: Not on file    Non-medical: Not on file  Tobacco Use  . Smoking status: Never Smoker  . Smokeless tobacco: Never Used  Substance and Sexual Activity  . Alcohol use: Yes    Comment: occ. Very rare.   . Drug use: No  . Sexual activity: Not on file  Lifestyle  . Physical activity:    Days per week: Not on file    Minutes per session: Not on file  . Stress: Not on file  Relationships  . Social connections:    Talks on phone: Not on file    Gets together: Not on file    Attends religious service: Not on file    Active member of club or organization: Not on file    Attends meetings of clubs or organizations: Not on file    Relationship status: Not on file  . Intimate partner violence:    Fear of current or ex partner: Not on file    Emotionally abused: Not on file  Physically abused: Not on file    Forced sexual activity: Not on file  Other Topics Concern  . Not on file  Social History Narrative  . Not on file    Past Surgical History:  Procedure Laterality Date  . ABDOMINAL HYSTERECTOMY     partial  . CHOLECYSTECTOMY      Family History  Problem Relation Age of Onset  . Diabetes Mother   . Diabetes Father     Allergies  Allergen Reactions  . Insulin Aspart Other (See Comments) and Swelling    adverse reaction to Novolog   . Latex Itching and Rash  . Morphine Itching and Rash    Current Outpatient Medications on File Prior to Visit  Medication Sig Dispense Refill  . Albuterol Sulfate (PROAIR RESPICLICK) 875 (90 Base) MCG/ACT AEPB Inhale 2 puffs into the lungs at bedtime as needed for shortness of breath.    Marland Kitchen amLODipine (NORVASC) 2.5 MG  tablet Take 1 tablet (2.5 mg total) by mouth daily. 30 tablet 0  . aspirin 81 MG tablet Take 81 mg by mouth daily.    Marland Kitchen atorvastatin (LIPITOR) 80 MG tablet Take 1 tablet (80 mg total) by mouth daily. 30 tablet 11  . carvedilol (COREG) 25 MG tablet Take 1 tablet (25 mg total) by mouth 2 (two) times daily with a meal. 60 tablet 11  . Cholecalciferol (VITAMIN D3) 400 units CAPS Take 400 mg by mouth daily.    . dapagliflozin propanediol (FARXIGA) 10 MG TABS tablet Take 10 mg by mouth daily.    . fluticasone furoate-vilanterol (BREO ELLIPTA) 100-25 MCG/INH AEPB Inhale 1 puff into the lungs daily.    Marland Kitchen gabapentin (NEURONTIN) 300 MG capsule Take 300 mg by mouth 4 (four) times daily as needed for pain.    Marland Kitchen insulin degludec (TRESIBA FLEXTOUCH) 100 UNIT/ML SOPN FlexTouch Pen Inject 40 Units into the skin daily at 10 pm.    . metoCLOPramide (REGLAN) 10 MG tablet Take 1 tablet (10 mg total) by mouth every 8 (eight) hours as needed for nausea. 20 tablet 0  . omeprazole (PRILOSEC) 20 MG capsule Take 1 capsule (20 mg total) by mouth daily. 30 capsule 3  . ondansetron (ZOFRAN ODT) 4 MG disintegrating tablet Take 1 tablet (4 mg total) by mouth every 8 (eight) hours as needed for nausea or vomiting. 20 tablet 0  . ondansetron (ZOFRAN) 4 MG tablet Take 1 tablet (4 mg total) by mouth every 6 (six) hours as needed for nausea. 20 tablet 0  . oxyCODONE (OXY IR/ROXICODONE) 5 MG immediate release tablet Take 5 mg by mouth every 12 (twelve) hours as needed.  0  . polyethylene glycol (MIRALAX / GLYCOLAX) packet Take 17 g by mouth daily. 14 each 0  . ranitidine (ZANTAC) 150 MG capsule Take 1 capsule (150 mg total) by mouth 2 (two) times daily. 60 capsule 0  . senna-docusate (SENOKOT-S) 8.6-50 MG tablet Take 2 tablets by mouth 2 (two) times daily. 60 tablet 1  . spironolactone (ALDACTONE) 25 MG tablet Take 25 mg by mouth daily.  1  . sucralfate (CARAFATE) 1 g tablet Take 1 tablet (1 g total) by mouth 4 (four) times daily -   with meals and at bedtime. 120 tablet 0  . tiZANidine (ZANAFLEX) 4 MG tablet Take 4 mg by mouth every 6 (six) hours as needed for muscle spasms.    . traMADol (ULTRAM) 50 MG tablet Take 1 tablet (50 mg total) by mouth every 6 (six) hours as  needed for moderate pain. 12 tablet 0  . famotidine (PEPCID) 20 MG tablet Take 20 mg by mouth 2 (two) times daily.    . fluticasone (FLONASE) 50 MCG/ACT nasal spray Place 2 sprays into the nose daily. 16 g 0   No current facility-administered medications on file prior to visit.     BP (!) 150/95   Pulse 81   Temp 98.2 F (36.8 C) (Oral)   Resp 16   Ht 6' (1.829 m)   Wt 202 lb 3.2 oz (91.7 kg)   SpO2 100%   BMI 27.42 kg/m       Objective:   Physical Exam   General Mental Status- Alert. General Appearance- Not in acute distress.   Skin General: Color- Normal Color. Moisture- Normal Moisture. On inspection no rash on back rt side. No vesicles. Not sensitive to light touch.  Neck Carotid Arteries- Normal color. Moisture- Normal Moisture. No carotid bruits. No JVD.  Chest and Lung Exam Auscultation: Breath Sounds:-Normal.  Cardiovascular Auscultation:Rythm- Regular. Murmurs & Other Heart Sounds:Auscultation of the heart reveals- No Murmurs.  Abdomen Inspection:-Inspeection Normal. Palpation/Percussion:Note:No mass. Palpation and Percussion of the abdomen reveal- faint Tender rt of umbilicus, Non Distended + BS, no rebound or guarding. No rt lower quadrant pain on palpation. Negative heel jar.   Neurologic Cranial Nerve exam:- CN III-XII intact(No nystagmus), symmetric smile. Strength:- 5/5 equal and symmetric strength both upper and lower extremities.   Rt CVA- faint tender to palpation.     Assessment & Plan:  For your recent right side CVA region pain and right side abdomen pain, I want to get a CBC, CMP, amylase and lipase.  Your urine test today looked clear.  No indicators of infection and no blood in the urine.  Urine  test was done to see if possible UTI or blood present which could indicate possible kidney stone.  With absence of blood this decrease the chance of kidney stone.  We will follow labs and see if there is any abnormality.  Pain could be muscular.  We will give you low-dose Toradol 30 mg and see how you respond.  You have oxycodone available for your chronic back pain and this could help you deal with the current knee pain as well.  However we are approaching the weekend and I want to see how you do with the injection and follow lab results.  Please give me an update by tomorrow morning how you are doing.  If your pain is not decreased significantly then I will go ahead and do abdomen and pelvis CT.  If I did order would likely due to renal stone protocol.  Today appears that your pain associated with gastroparesis has improved with Reglan.  Continue current dosing regimen.  Your blood pressure has been elevated the last couple times that I have seen you.  I want you to go ahead and start amlodipine 5 mg tablets daily and continue other blood pressure medications the same.  Some of your pain in the back and abdomen is on same dermatome level.  You had no rash consistent with shingles.  However this is in the differential diagnosis.  I am going to go ahead and give you a print prescription of Famvir in the event that you get rash that is consistent with shingles.  If that does occur then start Famvir immediately.  Follow-up in 7 days or as needed.  Mackie Pai, PA-C

## 2017-09-11 ENCOUNTER — Telehealth: Payer: Self-pay | Admitting: Medical

## 2017-09-11 ENCOUNTER — Telehealth: Payer: Self-pay

## 2017-09-11 DIAGNOSIS — R109 Unspecified abdominal pain: Secondary | ICD-10-CM

## 2017-09-11 NOTE — Telephone Encounter (Signed)
Need clarification where her source of pain. Pt had some cva area pain yesterday. Some abdomen pain recently and just got injection yesterday? What area is swollen. Is this pain and swelling in the injection site? If injection site any redness, warmth or tenderness.

## 2017-09-11 NOTE — Telephone Encounter (Signed)
Pt states pain is stomach and back. Pain in back is more constant throbbing.

## 2017-09-11 NOTE — Telephone Encounter (Signed)
Copied from [place] [id number]. Topic: Inquiry >> Sep 11, 2017 10:51 AM Oliver Pila B wrote: Reason for CRM: pt called to tell pcp that the shot relieved her pain but around 10:20am the pain is coming back and the area is swollen; contact pt if needed

## 2017-09-11 NOTE — Telephone Encounter (Signed)
I did discuss with pt her abdomen pain today. She states that pain resolved completely for about 24 hours after toradol. injection Pain can back and is moderate with worse pain on movement. Same location as yesterday. Radiates to rt flank on movement and bending over. Less pain not moving. No diarrhea or constipation.   Her CBC, metabolic panel, amylase and lipase were all normal.  Her urine yesterday did not show any blood or infection fighting cells.  If the Friday afternoon after hours and advised her to increase her omeprazole to 2 tablets daily.  Continue same regimen of Reglan, Zantac and Carafate.  Since she did respond to Toradol/NSAID yesterday advised her she could try over-the-counter ibuprofen 400 mg every 8 hours.  We had discussion on the fact that NSAIDs might irritate her stomach.  She did have a EGD which showed severe gastroparesis but if I remember correctly stomach was not fully evaluated due to severe amount of food present.  It is  after hours I explained to her that getting authorization for study such as CT abdomen pelvis after hours is not possible.  And presently it is unclear if that ct  indicated.  But I did go ahead and put in a abdomen 1 view x-ray that she can get done tomorrow afternoon.  We can assess that and see if there is any calcium based kidney stones and assess if she has any degree of constipation or other possible abnormalities.  I did explain to her that if the pain is worsening or changing that I want her to be seen in the emergency department and in that event they would likely do CT abdomen/pelvis.(may ct abd/pelvis was negative)  Also advised her that I will be in the office next week and that if she needs to be seen she could attempt to get appointment potentially with Dr. of the day if appointment thoughts are available.  Also advised her that she probably needs to call her GI doctor in touch with them regarding her abdomen pain and plan going forward.  GI MD  also never did a colonoscopy due to concern for possible complications in light of severe amount of food that was in her stomach.

## 2017-09-12 ENCOUNTER — Telehealth: Payer: Self-pay | Admitting: Medical

## 2017-09-12 NOTE — Telephone Encounter (Signed)
I looked for pt abd xray. No report all day. Called her and she states busy all day with work and her pain had decreased/better. She states will still get xray tomorrow. I asked her to call [facility] and double check outpatient hours on Sunday. They should be open but not sure of hours.

## 2017-09-13 ENCOUNTER — Ambulatory Visit (HOSPITAL_BASED_OUTPATIENT_CLINIC_OR_DEPARTMENT_OTHER)
Admission: RE | Admit: 2017-09-13 | Discharge: 2017-09-13 | Disposition: A | Discharge status: Home / Self Care | Payer: 59 | Source: Ambulatory Visit | Attending: Medical | Admitting: Medical

## 2017-09-13 DIAGNOSIS — R109 Unspecified abdominal pain: Secondary | ICD-10-CM | POA: Diagnosis not present

## 2017-09-15 ENCOUNTER — Telehealth: Payer: Self-pay | Admitting: *Deleted

## 2017-09-15 NOTE — Telephone Encounter (Signed)
I had filled out paperwork before I left and you had a question on the form. We discussed and you faxed? So why are they sending another form? Is this something different?

## 2017-09-15 NOTE — Telephone Encounter (Signed)
Received FMLA/STD paperwork from [facility]; completed as much as possible; will forward to provider upon RTO on Mon, 09/21/17/SLS 08/06

## 2017-09-24 ENCOUNTER — Ambulatory Visit (HOSPITAL_BASED_OUTPATIENT_CLINIC_OR_DEPARTMENT_OTHER)
Admission: RE | Admit: 2017-09-24 | Discharge: 2017-09-24 | Disposition: A | Discharge status: Home / Self Care | Payer: 59 | Source: Ambulatory Visit | Attending: Medical | Admitting: Medical

## 2017-09-24 ENCOUNTER — Ambulatory Visit (INDEPENDENT_AMBULATORY_CARE_PROVIDER_SITE_OTHER): Payer: 59 | Admitting: Medical

## 2017-09-24 ENCOUNTER — Encounter: Payer: Self-pay | Admitting: Medical

## 2017-09-24 VITALS — BP 138/92 | HR 83 | Temp 98.1°F | Resp 16 | Ht 72.0 in | Wt 199.8 lb

## 2017-09-24 DIAGNOSIS — M79605 Pain in left leg: Secondary | ICD-10-CM

## 2017-09-24 DIAGNOSIS — M25572 Pain in left ankle and joints of left foot: Secondary | ICD-10-CM | POA: Diagnosis not present

## 2017-09-24 DIAGNOSIS — I10 Essential (primary) hypertension: Secondary | ICD-10-CM

## 2017-09-24 DIAGNOSIS — R600 Localized edema: Secondary | ICD-10-CM

## 2017-09-24 DIAGNOSIS — K3184 Gastroparesis: Secondary | ICD-10-CM

## 2017-09-24 DIAGNOSIS — L089 Local infection of the skin and subcutaneous tissue, unspecified: Secondary | ICD-10-CM

## 2017-09-24 NOTE — Progress Notes (Signed)
Subjective:    Patient ID: [name], female    DOB: 04-May-1965, 52 y.o.   MRN: [id number]  HPI  Pt in for follow up.  She states that she has found a balance in the way she eats. She states she is monitoring how much she can eat. Hx of gastroparesis. She notes improvement immediatley when she increased her reglan to three times daily.  In past severe debilitating pain related to gastroparesis but now much better. EGD found gastroparesis.  Pt blood pressure is better today than on last visit. She increased amlodipine 2.5 mg to 5.0 mg a day. No cardiac or neurologic signs and symptoms. She is also on carvedilol.    Fain leftt lower ext/lower aspect calf region swelling. Faint lateral ankle pain sharp and mild constant(she states area feels warm). Swelling occurs more at end of the day. No pain in popliteal areas. Pt is diabetic.       Review of Systems  Constitutional: Negative for chills, fatigue and fever.  Respiratory: Negative for cough, chest tightness, shortness of breath and wheezing.   Cardiovascular: Negative for chest pain and palpitations.  Gastrointestinal: Negative for abdominal distention, abdominal pain, blood in stool and constipation.       See hpi. Controlled presenty.  Musculoskeletal: Negative for back pain, neck pain and neck stiffness.       Mild edema of both legs.  Skin: Negative for rash.       Left lower ext lateral aspect above calf feels mild warm to touch for weeks. Noticed weeks ago rubbing lotion.  Hematological: Negative for adenopathy. Does not bruise/bleed easily.  Psychiatric/Behavioral: Negative for agitation, confusion, sleep disturbance and suicidal ideas. The patient is not nervous/anxious.    Past Medical History:  Diagnosis Date  . Cervical cancer ([place])    had surgery in 2001.  . Coronary artery disease    30% lesions noted  . Depression   . Diabetes mellitus   . GERD (gastroesophageal reflux disease)   . Hyperlipidemia     . Hypertension   . Neuropathy      Social History   Socioeconomic History  . Marital status: Single    Spouse name: Not on file  . Number of children: Not on file  . Years of education: Not on file  . Highest education level: Not on file  Occupational History  . Not on file  Social Needs  . Financial resource strain: Not on file  . Food insecurity:    Worry: Not on file    Inability: Not on file  . Transportation needs:    Medical: Not on file    Non-medical: Not on file  Tobacco Use  . Smoking status: Never Smoker  . Smokeless tobacco: Never Used  Substance and Sexual Activity  . Alcohol use: Yes    Comment: occ. Very rare.   . Drug use: No  . Sexual activity: Not on file  Lifestyle  . Physical activity:    Days per week: Not on file    Minutes per session: Not on file  . Stress: Not on file  Relationships  . Social connections:    Talks on phone: Not on file    Gets together: Not on file    Attends religious service: Not on file    Active member of club or organization: Not on file    Attends meetings of clubs or organizations: Not on file    Relationship status: Not on file  .  Intimate partner violence:    Fear of current or ex partner: Not on file    Emotionally abused: Not on file    Physically abused: Not on file    Forced sexual activity: Not on file  Other Topics Concern  . Not on file  Social History Narrative  . Not on file    Past Surgical History:  Procedure Laterality Date  . ABDOMINAL HYSTERECTOMY     partial  . CHOLECYSTECTOMY      Family History  Problem Relation Age of Onset  . Diabetes Mother   . Diabetes Father     Allergies  Allergen Reactions  . Insulin Aspart Other (See Comments) and Swelling    adverse reaction to Novolog   . Latex Itching and Rash  . Morphine Itching and Rash    Current Outpatient Medications on File Prior to Visit  Medication Sig Dispense Refill  . Albuterol Sulfate (PROAIR RESPICLICK) 825 (90  Base) MCG/ACT AEPB Inhale 2 puffs into the lungs at bedtime as needed for shortness of breath.    Marland Kitchen amLODipine (NORVASC) 5 MG tablet Take 1 tablet (5 mg total) by mouth daily. 90 tablet 0  . aspirin 81 MG tablet Take 81 mg by mouth daily.    Marland Kitchen atorvastatin (LIPITOR) 80 MG tablet Take 1 tablet (80 mg total) by mouth daily. 30 tablet 11  . carvedilol (COREG) 25 MG tablet Take 1 tablet (25 mg total) by mouth 2 (two) times daily with a meal. 60 tablet 11  . Cholecalciferol (VITAMIN D3) 400 units CAPS Take 400 mg by mouth daily.    . dapagliflozin propanediol (FARXIGA) 10 MG TABS tablet Take 10 mg by mouth daily.    . famciclovir (FAMVIR) 500 MG tablet Take 1 tablet (500 mg total) by mouth 3 (three) times daily. 21 tablet 0  . fluticasone furoate-vilanterol (BREO ELLIPTA) 100-25 MCG/INH AEPB Inhale 1 puff into the lungs daily.    Marland Kitchen gabapentin (NEURONTIN) 300 MG capsule Take 300 mg by mouth 4 (four) times daily as needed for pain.    Marland Kitchen insulin degludec (TRESIBA FLEXTOUCH) 100 UNIT/ML SOPN FlexTouch Pen Inject 40 Units into the skin daily at 10 pm.    . metoCLOPramide (REGLAN) 10 MG tablet Take 1 tablet (10 mg total) by mouth every 8 (eight) hours as needed for nausea. 20 tablet 0  . omeprazole (PRILOSEC) 20 MG capsule Take 1 capsule (20 mg total) by mouth daily. 30 capsule 3  . ondansetron (ZOFRAN ODT) 4 MG disintegrating tablet Take 1 tablet (4 mg total) by mouth every 8 (eight) hours as needed for nausea or vomiting. 20 tablet 0  . ondansetron (ZOFRAN) 4 MG tablet Take 1 tablet (4 mg total) by mouth every 6 (six) hours as needed for nausea. 20 tablet 0  . oxyCODONE (OXY IR/ROXICODONE) 5 MG immediate release tablet Take 5 mg by mouth every 12 (twelve) hours as needed.  0  . polyethylene glycol (MIRALAX / GLYCOLAX) packet Take 17 g by mouth daily. 14 each 0  . ranitidine (ZANTAC) 150 MG capsule Take 1 capsule (150 mg total) by mouth 2 (two) times daily. 60 capsule 0  . senna-docusate (SENOKOT-S) 8.6-50  MG tablet Take 2 tablets by mouth 2 (two) times daily. 60 tablet 1  . spironolactone (ALDACTONE) 25 MG tablet Take 25 mg by mouth daily.  1  . sucralfate (CARAFATE) 1 g tablet Take 1 tablet (1 g total) by mouth 4 (four) times daily -  with meals and  at bedtime. 120 tablet 0  . tiZANidine (ZANAFLEX) 4 MG tablet Take 4 mg by mouth every 6 (six) hours as needed for muscle spasms.    . traMADol (ULTRAM) 50 MG tablet Take 1 tablet (50 mg total) by mouth every 6 (six) hours as needed for moderate pain. 12 tablet 0  . famotidine (PEPCID) 20 MG tablet Take 20 mg by mouth 2 (two) times daily.    . fluticasone (FLONASE) 50 MCG/ACT nasal spray Place 2 sprays into the nose daily. 16 g 0   No current facility-administered medications on file prior to visit.     BP (!) 138/92   Pulse 83   Temp 98.1 F (36.7 C) (Oral)   Resp 16   Ht 6' (1.829 m)   Wt 199 lb 12.8 oz (90.6 kg)   SpO2 100%   BMI 27.10 kg/m       Objective:   Physical Exam   General Mental Status- Alert. General Appearance- Not in acute distress.   Skin General: Color- Normal Color. Moisture- Normal Moisture.  Neck Carotid Arteries- Normal color. Moisture- Normal Moisture. No carotid bruits. No JVD.  Chest and Lung Exam Auscultation: Breath Sounds:-Normal.  Cardiovascular Auscultation:Rythm- Regular. Murmurs & Other Heart Sounds:Auscultation of the heart reveals- No Murmurs.  Abdomen Inspection:-Inspeection Normal. Palpation/Percussion:Note:No mass. Palpation and Percussion of the abdomen reveal- Non Tender, Non Distended + BS, no rebound or guarding.    Neurologic Cranial Nerve exam:- CN III-XII intact(No nystagmus), symmetric smile. Strength:- 5/5 equal and symmetric strength both upper and lower extremities.  Lower ext- calf thin and symmetric. Negative homans sign.  Left ankle- mild swelling on exam lateral aspect.  Skin- faint pinkness/redness latera upper ankle distal calf.       Assessment & Plan:    Your gastroparesis pain is improved please continue reglan. Filled out fmla form. Gave to sharon to fax.  For htn, get bp cuff otc and check bp daily. If bp over 140/90 consistently then need to add additional medication.  For pedal edema try to elevate legs. Swelling is minimal and near ankles worse on left side. Will get left lower ext [place].  For ankle region pain get xray of ankle.  For possible skin infection will rx keflex.   Follow up in 2-3 weeks or sooner. If faint pink/red appearance lateral ankle/calf area worsens then be seen sooner.  Mackie Pai, PA-C

## 2017-09-24 NOTE — Patient Instructions (Addendum)
Your gastroparesis pain is improved please continue reglan. Filled out fmla form. Gave to sharon to fax.  For htn, get bp cuff otc and check bp daily. If bp over 140/90 consistently then need to add additional medication.  For pedal edema try to elevate legs. Swelling is minimal and near ankles worse on left side. Will get left lower ext [place].  For ankle region pain get xray of ankle.  For possible skin infection will rx keflex.   Follow up in 2-3 weeks or sooner. If faint pink/red appearance lateral ankle/calf area worsens then be  seen sooner.

## 2017-09-25 ENCOUNTER — Telehealth: Payer: Self-pay | Admitting: Medical

## 2017-09-25 ENCOUNTER — Encounter: Payer: Self-pay | Admitting: Medical

## 2017-09-25 MED ORDER — CEPHALEXIN 500 MG PO CAPS: 500.0000 mg | ORAL_CAPSULE | Freq: Two times a day (BID) | ORAL | 0 refills | Status: DC

## 2017-09-25 NOTE — Telephone Encounter (Signed)
Pt called  This encounter was created in error - please disregard.

## 2017-09-25 NOTE — Telephone Encounter (Signed)
Contacted [name] at [facility] regarding prescription for Keflex that was to have been sent on 09/24/17;pt seen in office by [name] on 09/24/17; she states that she will make the provider aware; the pt's pharmacy of choice is [place], [place]; will route to office for notification of this encounter.

## 2017-09-25 NOTE — Telephone Encounter (Signed)
Did send in keflex today. I forgot to send but ws listed on assessment/plan section.

## 2017-09-25 NOTE — Telephone Encounter (Signed)
Sent in keflex to pt pharmacy. Did plan to send that yesterday.

## 2017-09-25 NOTE — Telephone Encounter (Signed)
Author phoned pt. to clarify med request, as keflex not on medication list or mentioned in Edward's OV note from 8/15 visit. No answer, unable to leave VM as box full. Routed to [place] and [place], [place] help clarify as [facility] reportedly has been speaking with CMA.

## 2017-09-25 NOTE — Telephone Encounter (Signed)
Copied from [place] [id number]. Topic: Quick Communication - See Telephone Encounter >> Sep 25, 2017  9:05 AM Mylinda Latina, NT wrote: CRM for notification. See Telephone encounter for: 09/25/17. Patient called and states that [name] was suppose to call in an antibiotic Keflex. She states it was  not at the pharmacy. Please send   [facility], [place] [phone] (Phone) [phone] (Fax)

## 2017-10-15 ENCOUNTER — Ambulatory Visit (INDEPENDENT_AMBULATORY_CARE_PROVIDER_SITE_OTHER): Payer: 59 | Admitting: Medical

## 2017-10-15 ENCOUNTER — Encounter: Payer: Self-pay | Admitting: Medical

## 2017-10-15 VITALS — BP 139/80 | HR 78 | Temp 98.0°F | Resp 16 | Ht 72.0 in | Wt 201.6 lb

## 2017-10-15 DIAGNOSIS — M25572 Pain in left ankle and joints of left foot: Secondary | ICD-10-CM | POA: Diagnosis not present

## 2017-10-15 DIAGNOSIS — L089 Local infection of the skin and subcutaneous tissue, unspecified: Secondary | ICD-10-CM | POA: Diagnosis not present

## 2017-10-15 DIAGNOSIS — K3184 Gastroparesis: Secondary | ICD-10-CM

## 2017-10-15 DIAGNOSIS — I10 Essential (primary) hypertension: Secondary | ICD-10-CM | POA: Diagnosis not present

## 2017-10-15 MED ORDER — DOXYCYCLINE HYCLATE 100 MG PO TABS: 100.0000 mg | ORAL_TABLET | Freq: Two times a day (BID) | ORAL | 0 refills | Status: DC

## 2017-10-15 MED ORDER — RANITIDINE HCL 150 MG PO CAPS: 150.0000 mg | ORAL_CAPSULE | Freq: Two times a day (BID) | ORAL | 3 refills | Status: DC

## 2017-10-15 NOTE — Progress Notes (Signed)
Subjective:    Patient ID: [name], female    DOB: 1965-10-24, 52 y.o.   MRN: [id number]  HPI  Pt in for follow up.  Pt bp little high today initially. Then rechecked. No cardiac or neurologic infections.  On last visit patient had pain  in left lower ext/lower aspect calf region swelling. Faint lateral ankle pain sharp at times(she states area still feels faint  Warm but better overall). Swelling occurs more at end of the day. No pain in popliteal areas. . Pt states overall better but points to left lateral ankle/talofibular region pain. I gave keflex in event for possible infection. Also ruled out dvt by [place]. Ankle xray was also normal.  Pt states her stomach pain is still adequately controlled. She states with controlling portions rx reglan, ranitidine, carafate and prilosec,    Review of Systems  Constitutional: Negative for chills, fatigue and fever.  Respiratory: Negative for cough, chest tightness, shortness of breath and wheezing.   Cardiovascular: Negative for chest pain and palpitations.  Gastrointestinal: Positive for abdominal pain.       See hpi.  Musculoskeletal:       Ankle pain  Skin:       No rash.  Neurological: Negative for dizziness, seizures, light-headedness and headaches.  Hematological: Negative for adenopathy. Does not bruise/bleed easily.  Psychiatric/Behavioral: Negative for behavioral problems, decreased concentration, sleep disturbance and suicidal ideas. The patient is not nervous/anxious.     Past Medical History:  Diagnosis Date  . Cervical cancer ([place])    had surgery in 2001.  . Coronary artery disease    30% lesions noted  . Depression   . Diabetes mellitus   . GERD (gastroesophageal reflux disease)   . Hyperlipidemia   . Hypertension   . Neuropathy      Social History   Socioeconomic History  . Marital status: Single    Spouse name: Not on file  . Number of children: Not on file  . Years of education: Not on file  .  Highest education level: Not on file  Occupational History  . Not on file  Social Needs  . Financial resource strain: Not on file  . Food insecurity:    Worry: Not on file    Inability: Not on file  . Transportation needs:    Medical: Not on file    Non-medical: Not on file  Tobacco Use  . Smoking status: Never Smoker  . Smokeless tobacco: Never Used  Substance and Sexual Activity  . Alcohol use: Yes    Comment: occ. Very rare.   . Drug use: No  . Sexual activity: Not on file  Lifestyle  . Physical activity:    Days per week: Not on file    Minutes per session: Not on file  . Stress: Not on file  Relationships  . Social connections:    Talks on phone: Not on file    Gets together: Not on file    Attends religious service: Not on file    Active member of club or organization: Not on file    Attends meetings of clubs or organizations: Not on file    Relationship status: Not on file  . Intimate partner violence:    Fear of current or ex partner: Not on file    Emotionally abused: Not on file    Physically abused: Not on file    Forced sexual activity: Not on file  Other Topics Concern  . Not  on file  Social History Narrative  . Not on file    Past Surgical History:  Procedure Laterality Date  . ABDOMINAL HYSTERECTOMY     partial  . CHOLECYSTECTOMY      Family History  Problem Relation Age of Onset  . Diabetes Mother   . Diabetes Father     Allergies  Allergen Reactions  . Insulin Aspart Other (See Comments) and Swelling    adverse reaction to Novolog   . Latex Itching and Rash  . Morphine Itching and Rash    Current Outpatient Medications on File Prior to Visit  Medication Sig Dispense Refill  . Albuterol Sulfate (PROAIR RESPICLICK) 161 (90 Base) MCG/ACT AEPB Inhale 2 puffs into the lungs at bedtime as needed for shortness of breath.    Marland Kitchen amLODipine (NORVASC) 5 MG tablet Take 1 tablet (5 mg total) by mouth daily. 90 tablet 0  . aspirin 81 MG tablet  Take 81 mg by mouth daily.    Marland Kitchen atorvastatin (LIPITOR) 80 MG tablet Take 1 tablet (80 mg total) by mouth daily. 30 tablet 11  . carvedilol (COREG) 25 MG tablet Take 1 tablet (25 mg total) by mouth 2 (two) times daily with a meal. 60 tablet 11  . cephALEXin (KEFLEX) 500 MG capsule Take 1 capsule (500 mg total) by mouth 2 (two) times daily. 20 capsule 0  . Cholecalciferol (VITAMIN D3) 400 units CAPS Take 400 mg by mouth daily.    . dapagliflozin propanediol (FARXIGA) 10 MG TABS tablet Take 10 mg by mouth daily.    . famciclovir (FAMVIR) 500 MG tablet Take 1 tablet (500 mg total) by mouth 3 (three) times daily. 21 tablet 0  . fluticasone furoate-vilanterol (BREO ELLIPTA) 100-25 MCG/INH AEPB Inhale 1 puff into the lungs daily.    Marland Kitchen gabapentin (NEURONTIN) 300 MG capsule Take 300 mg by mouth 4 (four) times daily as needed for pain.    Marland Kitchen insulin degludec (TRESIBA FLEXTOUCH) 100 UNIT/ML SOPN FlexTouch Pen Inject 40 Units into the skin daily at 10 pm.    . metoCLOPramide (REGLAN) 10 MG tablet Take 1 tablet (10 mg total) by mouth every 8 (eight) hours as needed for nausea. 20 tablet 0  . omeprazole (PRILOSEC) 20 MG capsule Take 1 capsule (20 mg total) by mouth daily. 30 capsule 3  . ondansetron (ZOFRAN ODT) 4 MG disintegrating tablet Take 1 tablet (4 mg total) by mouth every 8 (eight) hours as needed for nausea or vomiting. 20 tablet 0  . ondansetron (ZOFRAN) 4 MG tablet Take 1 tablet (4 mg total) by mouth every 6 (six) hours as needed for nausea. 20 tablet 0  . oxyCODONE (OXY IR/ROXICODONE) 5 MG immediate release tablet Take 5 mg by mouth every 12 (twelve) hours as needed.  0  . polyethylene glycol (MIRALAX / GLYCOLAX) packet Take 17 g by mouth daily. 14 each 0  . ranitidine (ZANTAC) 150 MG capsule Take 1 capsule (150 mg total) by mouth 2 (two) times daily. 60 capsule 0  . senna-docusate (SENOKOT-S) 8.6-50 MG tablet Take 2 tablets by mouth 2 (two) times daily. 60 tablet 1  . spironolactone (ALDACTONE) 25 MG  tablet Take 25 mg by mouth daily.  1  . sucralfate (CARAFATE) 1 g tablet Take 1 tablet (1 g total) by mouth 4 (four) times daily -  with meals and at bedtime. 120 tablet 0  . tiZANidine (ZANAFLEX) 4 MG tablet Take 4 mg by mouth every 6 (six) hours as needed for muscle  spasms.    . traMADol (ULTRAM) 50 MG tablet Take 1 tablet (50 mg total) by mouth every 6 (six) hours as needed for moderate pain. 12 tablet 0  . fluticasone (FLONASE) 50 MCG/ACT nasal spray Place 2 sprays into the nose daily. 16 g 0   No current facility-administered medications on file prior to visit.     BP (!) 128/96   Pulse 78   Temp 98 F (36.7 C) (Oral)   Resp 16   Ht 6' (1.829 m)   Wt 201 lb 9.6 oz (91.4 kg)   SpO2 100%   BMI 27.34 kg/m       Objective:   Physical Exam  General- No acute distress. Pleasant patient. Neck- Full range of motion, no jvd Lungs- Clear, even and unlabored. Heart- regular rate and rhythm. Neurologic- CNII- XII grossly intact. Abdomen-soft, nt,nd, +bs.   Left lower ext- no calf swelling. Anterior aspect ankle pain. Lateral aspect mild swelling. No warmth. But direct tender to palpation in region of talofibular ligament,     Assessment & Plan:  For your reflux type symptoms and history of gastroparesis, I want you to continue your current regimen of medications and I am refilling your ranitidine.  Your blood pressure on second check was below 140/90.  I want you to continue current regimen.  Your left ankle pain is still present.  He reports partial response to Keflex.  Though on today's exam the pain seems probably more related to a ligament or tendon pain as opposed to infection.  After discussion you did think that antibiotic had the impact on level of pain so we will prescribe additional 4 days of antibiotic but give doxycycline this time.  Rx advisement given to avoid side effects from Doxy.  I am going to go ahead and also make referral to sports medicine.  This will be  available in the event your pain lingers despite additional antibiotic use.  Also going to give you ace wrap and will see if that helps with your pain.  Follow-up in 3 months for blood pressure check and checkup regarding her gastroparesis.  Keep follow-up appointment with gastroenterologist as well.  But also can recheck your ankle in the event that referral to sports medicine is delayed.  Mackie Pai, PA-C

## 2017-10-15 NOTE — Patient Instructions (Signed)
For your reflux type symptoms and history of gastroparesis, I want you to continue your current regimen of medications and I am refilling your ranitidine.  Your blood pressure on second check was below 140/90.  I want you to continue current regimen.  Your left ankle pain is still present.  He reports partial response to Keflex.  Though on today's exam the pain seems probably more related to a ligament or tendon pain as opposed to infection.  After discussion you did think that antibiotic had the impact on level of pain so we will prescribe additional 4 days of antibiotic but give doxycycline this time.  Rx advisement given to avoid side effects from Doxy.  I am going to go ahead and also make referral to sports medicine.  This will be available in the event your pain lingers despite additional antibiotic use.  Also going to give you ace wrap and will see if that helps with your pain.  Follow-up in 3 months for blood pressure check and checkup regarding her gastroparesis.  Keep follow-up appointment with gastroenterologist as well.  But also can recheck your ankle in the event that referral to sports medicine is delayed.

## 2017-10-19 ENCOUNTER — Telehealth: Payer: Self-pay

## 2017-10-19 DIAGNOSIS — M25572 Pain in left ankle and joints of left foot: Secondary | ICD-10-CM

## 2017-10-19 MED ORDER — DOXYCYCLINE HYCLATE 100 MG PO TABS: 100.0000 mg | ORAL_TABLET | Freq: Two times a day (BID) | ORAL | 0 refills | Status: DC

## 2017-10-19 NOTE — Telephone Encounter (Signed)
I gave 3 more days of doxycycline. Would you clarify with pt that if she needs further antibiotic best to be seen sometime next week to evaluate the area as I want to avoid GI effects of antibiotic. Not clear that she had infection of leg but she reports the area feels better with doxy. So might need to recheck area on early next week.

## 2017-10-19 NOTE — Telephone Encounter (Signed)
Copied from [place] [id number]. Topic: Quick Communication - See Telephone Encounter >> Oct 19, 2017 12:10 PM Hewitt Shorts wrote: Pt states that the doxycycline worked and she feels she needs another round -very hard to understand the call lot of back ground noise   [facility] [address] [place]

## 2017-10-21 NOTE — Telephone Encounter (Signed)
Pt. Requesting podiatry referral.

## 2017-10-21 NOTE — Telephone Encounter (Signed)
Left pt a message to call back. 

## 2017-10-21 NOTE — Telephone Encounter (Signed)
Please give me update.

## 2017-10-21 NOTE — Telephone Encounter (Signed)
I don't remember her complaining of bottom of foot ulcers or breakdown on her feet. Sounds like she does need referral I just need more information on where these area are. Can she give width and depth of these area/potenial ulcers on both feet. Mid foot, heel or metatarsal head region. I can refer to podiatrist but knowing the above would help expidite referral. She uses my chart in past. Does she know how to attach picture to my chart? I need info as it is hard to push for quick referral if I don't know what area looks like etc.

## 2017-10-21 NOTE — Telephone Encounter (Signed)
Patient stated the swelling and pain have decreased tremendously. Reported she noticed a sore on the bottom of each foot that she now believes might be the cause of the pain and swelling.  She would like a Podiatrist referral as soon as possible, someone close to the [place]. Routed to PCP.

## 2017-10-22 NOTE — Telephone Encounter (Signed)
TC to patient. Left foot with callused area between 3rd and 4th toe 2"x2" swollen, red painful and warm to touch. No open area or drainage reported.  Left foot 4th toe also has a scabbed healing sore on the underside of toe about the size of a pea-no drainage. Right foot has a callus between 3rd and 4th toe about 2"x2" with no open area that is only mildly swollen and does not hurt.  She stated she hadn't noticed these areas at the time of the 10/15/17 visit.  If possible, she request the podiatrist referral be in the [facility] area.

## 2017-10-23 ENCOUNTER — Telehealth: Payer: Self-pay | Admitting: Medical

## 2017-10-23 DIAGNOSIS — L089 Local infection of the skin and subcutaneous tissue, unspecified: Secondary | ICD-10-CM

## 2017-10-23 DIAGNOSIS — L84 Corns and callosities: Secondary | ICD-10-CM

## 2017-10-23 NOTE — Telephone Encounter (Signed)
Made referral. Sent message to Lu Duffel and Skip Estimable. [place] to start referral and other staff to call and advise plan over the weekend.

## 2017-10-23 NOTE — Telephone Encounter (Signed)
[name], referral coordinator from [place], made aware of urgent referral to podiatry. Will route to Maudie Mercury, front end Freight forwarder, to follow up on referral upon her return to the office 9/16.

## 2017-10-23 NOTE — Telephone Encounter (Signed)
Please let pt know I have put in referral to podiatrist. She sent my chart message with request to be seen by podiatrist. I reviewed this note below this morning and just made the referral. Goal is to get her in by early next week with podiatrist. If unable to seen them onday or Tuesday then I need to see her one of those days. Please notify her. If below worsens over the weekend then advise ED evaluation.  TC to patient. Left foot with callused area between 3rd and 4th toe 2"x2" swollen, red painful and warm to touch. No open area or drainage reported.  Left foot 4th toe also has a scabbed healing sore on the underside of toe about the size of a pea-no drainage. Right foot has a callus between 3rd and 4th toe about 2"x2" with no open area that is only mildly swollen and does not hurt.  She stated she hadn't noticed these areas at the time of the 10/15/17 visit.  If possible, she request the podiatrist referral be in the [facility] area.

## 2017-10-23 NOTE — Telephone Encounter (Signed)
Will you refer pt to podiatrist. See referral. Try to get her in early Monday or Tuesday next week.

## 2017-11-05 ENCOUNTER — Ambulatory Visit: Payer: 59 | Admitting: Family Medicine

## 2017-11-09 ENCOUNTER — Telehealth: Payer: Self-pay | Admitting: *Deleted

## 2017-11-09 NOTE — Telephone Encounter (Signed)
Received correspondence from [facility] via letter to patient stating that she had exceeded the frequency and/or duration of approved FMLA, need provider approval to cover these dates; forwarded to provider/SLS 09/30

## 2017-11-16 ENCOUNTER — Telehealth: Payer: Self-pay | Admitting: Medical

## 2017-11-16 NOTE — Telephone Encounter (Signed)
Will you fax over Pt letter to [employer]. Then place the forms and letter on sharon desk. Not sure what would be charge if any for this letter.?

## 2017-11-16 NOTE — Telephone Encounter (Signed)
For faxed to [employer].

## 2017-12-21 ENCOUNTER — Other Ambulatory Visit: Payer: Self-pay | Admitting: Medical

## 2017-12-21 MED ORDER — DAPAGLIFLOZIN PROPANEDIOL 10 MG PO TABS: 10.0000 mg | ORAL_TABLET | Freq: Every day | ORAL | 1 refills | Status: DC

## 2017-12-21 NOTE — Telephone Encounter (Signed)
° ° ° °  Pt called back and said she only need oral medication Farxiga

## 2017-12-21 NOTE — Telephone Encounter (Signed)
Refills have been sent.  

## 2017-12-21 NOTE — Telephone Encounter (Signed)
Attempted to call pt x 2 regarding requested medication refill but no answer. Voicemail is full and unable to leave message at this time. Pt has Farxiga 10mg  tab listed on med list and also [place] Flextouch insulin pen. Clarification needed on if the pt is requesting a refill of the insulin pen or the oral medication.

## 2017-12-21 NOTE — Telephone Encounter (Signed)
Copied from [place] [phone]. Topic: Quick Communication - See Telephone Encounter >> Dec 21, 2017 10:18 AM Vernona Rieger wrote: CRM for notification. See Telephone encounter for: 12/21/17.  Pt called to request a refill on her " insulin pen " she states that it is called " farxgia ". I do not see this on medication list. She said she contacted pharmacy 3 days ago. Please advise  [place]

## 2017-12-28 ENCOUNTER — Other Ambulatory Visit (HOSPITAL_BASED_OUTPATIENT_CLINIC_OR_DEPARTMENT_OTHER): Payer: Self-pay | Admitting: Nurse Practitioner

## 2017-12-28 DIAGNOSIS — M545 Low back pain, unspecified: Secondary | ICD-10-CM

## 2017-12-28 DIAGNOSIS — M25551 Pain in right hip: Secondary | ICD-10-CM

## 2018-01-02 ENCOUNTER — Encounter (HOSPITAL_BASED_OUTPATIENT_CLINIC_OR_DEPARTMENT_OTHER): Payer: Self-pay

## 2018-01-02 ENCOUNTER — Ambulatory Visit (HOSPITAL_BASED_OUTPATIENT_CLINIC_OR_DEPARTMENT_OTHER): Payer: 59

## 2018-01-09 ENCOUNTER — Ambulatory Visit (HOSPITAL_BASED_OUTPATIENT_CLINIC_OR_DEPARTMENT_OTHER): Payer: 59

## 2018-01-10 ENCOUNTER — Other Ambulatory Visit: Payer: Self-pay | Admitting: Medical

## 2018-01-13 MED ORDER — CIMETIDINE 400 MG PO TABS: 400.0000 mg | ORAL_TABLET | Freq: Two times a day (BID) | ORAL | 3 refills | Status: DC

## 2018-01-13 NOTE — Telephone Encounter (Signed)
Patient called to get the status on her refill request.  Patient stated that the pharmacy called her and stated that the medication ranitidine (ZANTAC) 150 MG capsule [Pharmacy Med  Is no longer available and wanted to know if there is an alternative medication that she can take.  Please advise and call patient back at [phone]

## 2018-01-13 NOTE — Telephone Encounter (Signed)
There was a recall on zantac. Alternative would be tagamet which is over the counter. She can try this in place of zantac.  I sent that to patient pharmacy.

## 2018-01-15 ENCOUNTER — Other Ambulatory Visit: Payer: Self-pay

## 2018-01-15 MED ORDER — AMLODIPINE BESYLATE 5 MG PO TABS: 5.0000 mg | ORAL_TABLET | Freq: Every day | ORAL | 0 refills | Status: DC

## 2018-02-11 DIAGNOSIS — M48061 Spinal stenosis, lumbar region without neurogenic claudication: Secondary | ICD-10-CM

## 2018-02-11 HISTORY — DX: Spinal stenosis, lumbar region without neurogenic claudication: M48.061

## 2018-02-18 ENCOUNTER — Other Ambulatory Visit: Payer: Self-pay

## 2018-02-18 ENCOUNTER — Telehealth: Payer: Self-pay | Admitting: Medical

## 2018-02-18 MED ORDER — SPIRONOLACTONE 25 MG PO TABS: 25.0000 mg | ORAL_TABLET | Freq: Every day | ORAL | 1 refills | Status: DC

## 2018-02-18 NOTE — Progress Notes (Signed)
Pt requesting refill on spironolactone. Please advise

## 2018-02-18 NOTE — Telephone Encounter (Signed)
Will you get pt scheduled for follow up bp check. Want to check cmp/potassium level.

## 2018-02-26 NOTE — Telephone Encounter (Signed)
Pt called to ask for a refill of  promethazine (PHENERGAN) 25 MG tablet Pt states the zofran does not work for her) However [facility] has never filled this Rx.  Advised pt she would need to be seen.  als advised per note [facility] wants to see her anyway Pt is scheduled for Monday, Jan 20 at 1 pm

## 2018-03-01 ENCOUNTER — Ambulatory Visit: Payer: 59 | Admitting: Medical

## 2018-03-01 DIAGNOSIS — Z0289 Encounter for other administrative examinations: Secondary | ICD-10-CM

## 2018-03-09 ENCOUNTER — Other Ambulatory Visit: Payer: Self-pay | Admitting: Medical

## 2018-03-09 ENCOUNTER — Telehealth: Payer: Self-pay | Admitting: Medical

## 2018-03-09 MED ORDER — ATORVASTATIN CALCIUM 80 MG PO TABS: 80.0000 mg | ORAL_TABLET | Freq: Every day | ORAL | 0 refills | Status: DC

## 2018-03-09 NOTE — Telephone Encounter (Signed)
Copied from [place] [id number]. Topic: Quick Communication - Rx Refill/Question >> Mar 09, 2018  9:46 AM Leward Quan A wrote: Medication: atorvastatin (LIPITOR) 80 MG tablet   Has the patient contacted their pharmacy? Yes.   (Agent: If no, request that the patient contact the pharmacy for the refill.) (Agent: If yes, when and what did the pharmacy advise?)  Preferred Pharmacy (with phone number or street name): [facility] [phone] (Phone) [phone] (Fax)    Agent: Please be advised that RX refills may take up to 3 business days. We ask that you follow-up with your pharmacy.

## 2018-03-09 NOTE — Telephone Encounter (Signed)
Copied from [place] [id number]. Topic: Quick Communication - Rx Refill/Question >> Mar 09, 2018  9:46 AM Leward Quan A wrote: Medication: atorvastatin (LIPITOR) 80 MG tablet   Has the patient contacted their pharmacy? Yes.   (Agent: If no, request that the patient contact the pharmacy for the refill.) (Agent: If yes, when and what did the pharmacy advise?)  Preferred Pharmacy (with phone number or street name): [place] [phone] (Phone) [phone] (Fax)    Agent: Please be advised that RX refills may take up to 3 business days. We ask that you follow-up with your pharmacy.

## 2018-03-09 NOTE — Telephone Encounter (Signed)
Requested Prescriptions  Pending Prescriptions Disp Refills  . atorvastatin (LIPITOR) 80 MG tablet 90 tablet 0    Sig: Take 1 tablet (80 mg total) by mouth daily.     Cardiovascular:  Antilipid - Statins Failed - 03/09/2018  9:49 AM      Failed - Total Cholesterol in normal range and within 360 days    Cholesterol  Date Value Ref Range Status  07/18/2009  0 - 200 mg/dL Final   193        ATP III CLASSIFICATION:  <200     mg/dL   Desirable  200-239  mg/dL   Borderline High  >=240    mg/dL   High                Failed - LDL in normal range and within 360 days    LDL Cholesterol  Date Value Ref Range Status  07/18/2009 (H) 0 - 99 mg/dL Final   141        Total Cholesterol/HDL:CHD Risk Coronary Heart Disease Risk Table                     Men   Women  1/2 Average Risk   3.4   3.3  Average Risk       5.0   4.4  2 X Average Risk   9.6   7.1  3 X Average Risk  23.4   11.0        Use the calculated Patient Ratio above and the CHD Risk Table to determine the patient's CHD Risk.        ATP III CLASSIFICATION (LDL):  <100     mg/dL   Optimal  100-129  mg/dL   Near or Above                    Optimal  130-159  mg/dL   Borderline  160-189  mg/dL   High  >190     mg/dL   Very High         Failed - HDL in normal range and within 360 days    HDL  Date Value Ref Range Status  07/18/2009 40 >39 mg/dL Final         Failed - Triglycerides in normal range and within 360 days    Triglycerides  Date Value Ref Range Status  07/18/2009 61 <150 mg/dL Final         Passed - Patient is not pregnant      Passed - Valid encounter within last 12 months    Recent Outpatient Visits          4 months ago Gastroparesis   [facility] [place], [place]   5 months ago [facility] [place], [place]   6 months ago [facility] [place], [place]   6 months ago [place] at [place], [facility]   6 months ago Epigastric pain   [facility] at [place], [place]

## 2018-04-19 ENCOUNTER — Other Ambulatory Visit: Payer: Self-pay | Admitting: Medical

## 2018-04-20 NOTE — Telephone Encounter (Signed)
Pt due for follow up please call and schedule appointment.  

## 2018-05-02 ENCOUNTER — Other Ambulatory Visit: Payer: Self-pay | Admitting: Medical

## 2018-05-13 ENCOUNTER — Other Ambulatory Visit: Payer: Self-pay

## 2018-05-13 ENCOUNTER — Ambulatory Visit (INDEPENDENT_AMBULATORY_CARE_PROVIDER_SITE_OTHER): Payer: 59 | Admitting: Medical

## 2018-05-13 DIAGNOSIS — K219 Gastro-esophageal reflux disease without esophagitis: Secondary | ICD-10-CM

## 2018-05-13 DIAGNOSIS — M545 Low back pain, unspecified: Secondary | ICD-10-CM

## 2018-05-13 MED ORDER — HYDROCODONE-ACETAMINOPHEN 5-325 MG PO TABS: ORAL_TABLET | ORAL | 0 refills | Status: DC

## 2018-05-13 MED ORDER — TIZANIDINE HCL 4 MG PO TABS: 4.0000 mg | ORAL_TABLET | Freq: Every day | ORAL | 0 refills | Status: DC

## 2018-05-13 MED ORDER — PROMETHAZINE HCL 12.5 MG PO TABS: 12.5000 mg | ORAL_TABLET | Freq: Three times a day (TID) | ORAL | 0 refills | Status: DC | PRN

## 2018-05-13 NOTE — Addendum Note (Signed)
Addended by: Anabel Halon on: 05/13/2018 10:41 AM   Modules accepted: Orders

## 2018-05-13 NOTE — Progress Notes (Addendum)
Subjective:    Patient ID: [name], female    DOB: 1965-10-17, 53 y.o.   MRN: [id number]  HPI  Virtual Visit via Video Note  I connected with [name] on 05/13/18 at 10:00 AM EDT by a video enabled telemedicine application and verified that I am speaking with the correct person using two identifiers.   I discussed the limitations of evaluation and management by telemedicine and the availability of in person appointments. The patient expressed understanding and agreed to proceed.   History of Present Illness:  Pt in states she has been out of work for about 3-4 months. She just started back to work. She states she had foot surgery. She had bone removed in the foot. Pressure in that area caused infection.   She states foot feels better.   Since return to work has lower back pain. She has history of sciatica. Pt has seen pain management in the past. While she was off of work  the pain in her back was a lot less. Pain gradually increasses during the day. Late in day after work pain is the worst/severe. Pt has hx of spinal stenosis and lumbar athropathy.  Describes difficulty sleeping due to the high level of pain.  Pt was seeing pain managemenent in January.     Pt presently she is not aware that she is under contract and she wants to withdraw care with them. Pt states oxycodone made her nausea. More at the higher dose.  Patient is not reporting any leg weakness or radicular pain to lower extremities.   Observations/Objective: No acute distress.   Assessment and Plan: You do have history of lumbar spinal stenosis and lumbar arthropathy.  History of chronic pain in this area by review of your chart.  You have seen pain specialist in the past.  Pain was under control over the last 3 to 4 months when you were not working but recent re-flare of back pain when you return to work recently.  You expressed no longer wanting to be seen by the pain management clinic that  you were formally seen.  We had discussion that could refer you to another pain management clinic in the future or refer you to neurosurgeon if needed.  We can try giving you Norco/hydrocodone rather than oxycodone presently.  Also refill your Zanaflex that you used in the past.  Rx advisement given regarding both medications.  Will only give a 5-day supply of the hydrocodone.  You will update [place] on how you are doing by next Tuesday.  If you feel that you need monthly supply of the medications then will need to come in to sign controlled medication contract and to give UDS.  We can make that arrangement.  Also for your history of GERD and associated nausea, continue with your current GI medications and I am refilling Phenergan.  Counseled not to use the Norco, Zanaflex and Phenergan together.  Patient expressed understanding.   Follow Up Instructions:    I discussed the assessment and treatment plan with the patient. The patient was provided an opportunity to ask questions and all were answered. The patient agreed with the plan and demonstrated an understanding of the instructions.   The patient was advised to call back or seek an in-person evaluation if the symptoms worsen or if the condition fails to improve as anticipated.  I provided 25  minutes of non-face-to-face time during this encounter.  50% of time spent explaining treatment approach and  rules/regulations regarding prescription of controlled medications.   Mackie Pai, PA-C  Sending note for supervising DO to review.  Mackie Pai, PA-C  Review of Systems     Objective:   Physical Exam  na      Assessment & Plan:

## 2018-05-13 NOTE — Patient Instructions (Addendum)
You do have history of lumbar spinal stenosis and lumbar arthropathy.  History of chronic pain in this area by review of your chart.  You have seen pain specialist in the past.  Pain was under control over the last 3 to 4 months when you were not working but recent re-flare of back pain when you return to work recently.  You expressed no longer wanting to be seen by the pain management clinic that you were formally seen.  We had discussion that could refer you to another pain management clinic in the future or refer you to neurosurgeon if needed.  We can try giving you Norco/hydrocodone rather than oxycodone presently.  Also refill your Zanaflex that you used in the past.  Rx advisement given regarding both medications.  Will only give a 5-day supply of the hydrocodone.  You will update [place] on how you are doing by next Tuesday.  If you feel that you need monthly supply of the medications then will need to come in to sign controlled medication contract and to give UDS.  We can make that arrangement.  Also for your history of GERD and associated nausea, continue with your current GI medications and I am refilling Phenergan.  Counseled not to use the Norco, Zanaflex and Phenergan together.  Patient expressed understanding.

## 2018-05-15 ENCOUNTER — Other Ambulatory Visit: Payer: Self-pay | Admitting: Medical

## 2018-05-18 ENCOUNTER — Telehealth: Payer: Self-pay | Admitting: Medical

## 2018-05-18 NOTE — Telephone Encounter (Signed)
Reviewed last note. Will see how she does on update. She may need other virtual visit. Will likely need to advise other medication other than controlled meds. Since she is not on contract and don't want to continue controlled meds without exam. May need to refer her to sports medicine.

## 2018-05-18 NOTE — Telephone Encounter (Signed)
rec'd voice message from pt. to give update to provider re: how well Hydrocodone is working.  Reported the Hydrocodone is easing the pain in her back for about 1-2 hours, then it returns.  Reported she has only been taking the medication since last Thursday, 4/2.  Stated she is unsure if her pain is muscular or not.  Stated she would like to give it more time, and will call again, with another update, either Friday, 4/10, or Mon. 4/13.  Will forward message to PCP.

## 2018-05-18 NOTE — Telephone Encounter (Signed)
FYI

## 2018-06-03 ENCOUNTER — Other Ambulatory Visit: Payer: Self-pay

## 2018-06-03 ENCOUNTER — Ambulatory Visit (INDEPENDENT_AMBULATORY_CARE_PROVIDER_SITE_OTHER): Payer: 59 | Admitting: Medical

## 2018-06-03 ENCOUNTER — Encounter: Payer: Self-pay | Admitting: Medical

## 2018-06-03 DIAGNOSIS — M5431 Sciatica, right side: Secondary | ICD-10-CM

## 2018-06-03 DIAGNOSIS — M545 Low back pain, unspecified: Secondary | ICD-10-CM

## 2018-06-03 DIAGNOSIS — Z79899 Other long term (current) drug therapy: Secondary | ICD-10-CM

## 2018-06-03 MED ORDER — OXYCODONE HCL 5 MG PO TABS: ORAL_TABLET | ORAL | 0 refills | Status: DC

## 2018-06-03 NOTE — Patient Instructions (Addendum)
Patient does have history of lumbar back pain with some pain radiating to her right buttocks region.  Pain progressively getting worse not responding to Norco.  She states Oxy 5 milligrams tablets she could tolerate but when she got the oxycodone with Tylenol it made her nauseous and excessively drowsy.  Explained to her that I could write Oxy 5 milligrams but we need to get UDS tomorrow and get her signed controlled medication contract.  She does have a history of high blood pressure and she will check her blood pressure today and bring [place] the reading tomorrow.  If her blood pressure is less than 140/90 then I will provide her with low-dose meloxicam.  Will investigate with radiology department to see if she can get her MRI which is ordered by pain clinic.  Might need to refer her to sports medicine if she does not respond to treatment changes today.  Patient has some leftover Norco tablets and explained clearly not to ever use [place] and Norco together.  She expressed understanding.  Follow-up in 10 days or as needed.

## 2018-06-03 NOTE — Progress Notes (Signed)
Subjective:    Patient ID: [name], female    DOB: Feb 20, 1965, 53 y.o.   MRN: [id number]  HPI  Virtual Visit via Video Note  I connected with [name] on 06/03/18 at  9:40 AM EDT by a video enabled telemedicine application and verified that I am speaking with the correct person using two identifiers.   I discussed the limitations of evaluation and management by telemedicine and the availability of in person appointments. The patient expressed understanding and agreed to proceed.  History of Present Illness:   Pt states feeling good overall but this morning her lower back pain has been moderate to severe in lumbar spine.  Pt had virtual visit just recently and she states pain is gradually getting worse. Pain in morning moderate to severe. Eases up some at time  but by end of day standing and working pain will eventually worsen. Pt states pain mid back to rt side. Some pain rt buttox and toward hip. Some lower buttox and some times pain feels week. No pain shooting down leg.   Pt in past saw pain management clinic. Pt has mri lumbar spine order in chart. That was rescheduled twice.   Pain management MD in past gave oxy 5 mg with tylenol   and she states made her nausea. Oxy with tylenol made her too tired/sedated. She states plane oxy caused no side effects.  Pt states norco will help. She can take 1 tab norco at work. But 2 makes her drowsy. Recently norco not controlling her pain.  Specialist told her she had disc issues.    Observations/Objective: No acute distress. No physical exam but below States mid back pain radiating toward bottom rt buttox. At time legs feels mid weak.  Assessment and Plan: Patient does have history of lumbar back pain with some pain radiating to her right buttocks region.  Pain progressively getting worse not responding to Norco.  She states Oxy 5 milligrams tablets she could tolerate but when she got the oxycodone with Tylenol it  made her nauseous and excessively drowsy.  Explained to her that I could write Oxy 5 milligrams but we need to get UDS tomorrow and get her signed controlled medication contract.  She does have a history of high blood pressure and she will check her blood pressure today and bring [place] the reading tomorrow.  If her blood pressure is less than 140/90 then I will provide her with low-dose meloxicam.  Will investigate with radiology department to see if she can get her MRI which is ordered by pain clinic.  Might need to refer her to sports medicine if she does not respond to treatment changes today.  Patient has some leftover Norco tablets and explained clearly not to ever use [place] and Norco together.  She expressed understanding.  Follow-up in 10 days or as needed.  Follow Up Instructions:    I discussed the assessment and treatment plan with the patient. The patient was provided an opportunity to ask questions and all were answered. The patient agreed with the plan and demonstrated an understanding of the instructions.   The patient was advised to call back or seek an in-person evaluation if the symptoms worsen or if the condition fails to improve as anticipated.     Mackie Pai, PA-C    Review of Systems  Constitutional: Negative for chills, fatigue and fever.  Respiratory: Negative for cough, chest tightness and wheezing.   Cardiovascular: Negative for chest pain and  palpitations.  Gastrointestinal: Negative for abdominal distention and abdominal pain.  Genitourinary:       None  Musculoskeletal: Positive for back pain.  Skin: Negative for rash.  Neurological:       See hpi  Hematological: Negative for adenopathy. Does not bruise/bleed easily.  Psychiatric/Behavioral: Negative for agitation and confusion.    Past Medical History:  Diagnosis Date  . Cervical cancer ([place])    had surgery in 2001.  . Coronary artery disease    30% lesions noted  . Depression   . Diabetes  mellitus   . GERD (gastroesophageal reflux disease)   . Hyperlipidemia   . Hypertension   . Neuropathy      Social History   Socioeconomic History  . Marital status: Single    Spouse name: Not on file  . Number of children: Not on file  . Years of education: Not on file  . Highest education level: Not on file  Occupational History  . Not on file  Social Needs  . Financial resource strain: Not on file  . Food insecurity:    Worry: Not on file    Inability: Not on file  . Transportation needs:    Medical: Not on file    Non-medical: Not on file  Tobacco Use  . Smoking status: Never Smoker  . Smokeless tobacco: Never Used  Substance and Sexual Activity  . Alcohol use: Yes    Comment: occ. Very rare.   . Drug use: No  . Sexual activity: Not on file  Lifestyle  . Physical activity:    Days per week: Not on file    Minutes per session: Not on file  . Stress: Not on file  Relationships  . Social connections:    Talks on phone: Not on file    Gets together: Not on file    Attends religious service: Not on file    Active member of club or organization: Not on file    Attends meetings of clubs or organizations: Not on file    Relationship status: Not on file  . Intimate partner violence:    Fear of current or ex partner: Not on file    Emotionally abused: Not on file    Physically abused: Not on file    Forced sexual activity: Not on file  Other Topics Concern  . Not on file  Social History Narrative  . Not on file    Past Surgical History:  Procedure Laterality Date  . ABDOMINAL HYSTERECTOMY     partial  . CHOLECYSTECTOMY      Family History  Problem Relation Age of Onset  . Diabetes Mother   . Diabetes Father     Allergies  Allergen Reactions  . Insulin Aspart Other (See Comments) and Swelling    adverse reaction to Novolog   . Latex Itching and Rash  . Morphine Itching and Rash    Current Outpatient Medications on File Prior to Visit   Medication Sig Dispense Refill  . Albuterol Sulfate (PROAIR RESPICLICK) 829 (90 Base) MCG/ACT AEPB Inhale 2 puffs into the lungs at bedtime as needed for shortness of breath.    Marland Kitchen amLODipine (NORVASC) 5 MG tablet Take 1 tablet by mouth once daily 90 tablet 0  . aspirin 81 MG tablet Take 81 mg by mouth daily.    Marland Kitchen atorvastatin (LIPITOR) 80 MG tablet Take 1 tablet (80 mg total) by mouth daily. 90 tablet 0  . carvedilol (COREG) 25 MG tablet  Take 1 tablet (25 mg total) by mouth 2 (two) times daily with a meal. 60 tablet 11  . cephALEXin (KEFLEX) 500 MG capsule Take 1 capsule (500 mg total) by mouth 2 (two) times daily. 20 capsule 0  . Cholecalciferol (VITAMIN D3) 400 units CAPS Take 400 mg by mouth daily.    . cimetidine (TAGAMET) 400 MG tablet Take 1 tablet (400 mg total) by mouth 2 (two) times daily. 60 tablet 3  . dapagliflozin propanediol (FARXIGA) 10 MG TABS tablet Take 10 mg by mouth daily. 90 tablet 1  . doxycycline (VIBRA-TABS) 100 MG tablet Take 1 tablet (100 mg total) by mouth 2 (two) times daily. Can give caps or generic. 6 tablet 0  . famciclovir (FAMVIR) 500 MG tablet Take 1 tablet (500 mg total) by mouth 3 (three) times daily. 21 tablet 0  . fluticasone (FLONASE) 50 MCG/ACT nasal spray Place 2 sprays into the nose daily. 16 g 0  . fluticasone furoate-vilanterol (BREO ELLIPTA) 100-25 MCG/INH AEPB Inhale 1 puff into the lungs daily.    Marland Kitchen gabapentin (NEURONTIN) 300 MG capsule Take 300 mg by mouth 4 (four) times daily as needed for pain.    Marland Kitchen HYDROcodone-acetaminophen (NORCO) 5-325 MG tablet 1-2 tab po q 6 hours as needed sever pain. 30 tablet 0  . insulin degludec (TRESIBA FLEXTOUCH) 100 UNIT/ML SOPN FlexTouch Pen Inject 40 Units into the skin daily at 10 pm.    . metoCLOPramide (REGLAN) 10 MG tablet Take 1 tablet (10 mg total) by mouth every 8 (eight) hours as needed for nausea. 20 tablet 0  . omeprazole (PRILOSEC) 20 MG capsule Take 1 capsule (20 mg total) by mouth daily. 30 capsule 3  .  polyethylene glycol (MIRALAX / GLYCOLAX) packet Take 17 g by mouth daily. 14 each 0  . promethazine (PHENERGAN) 12.5 MG tablet Take 1 tablet (12.5 mg total) by mouth every 8 (eight) hours as needed for nausea or vomiting. 20 tablet 0  . ranitidine (ZANTAC) 150 MG capsule Take 1 capsule (150 mg total) by mouth 2 (two) times daily. 60 capsule 3  . senna-docusate (SENOKOT-S) 8.6-50 MG tablet Take 2 tablets by mouth 2 (two) times daily. 60 tablet 1  . spironolactone (ALDACTONE) 25 MG tablet Take 1 tablet (25 mg total) by mouth daily. 30 tablet 3  . sucralfate (CARAFATE) 1 g tablet Take 1 tablet (1 g total) by mouth 4 (four) times daily -  with meals and at bedtime. 120 tablet 0  . tiZANidine (ZANAFLEX) 4 MG tablet Take 1 tablet (4 mg total) by mouth at bedtime. 10 tablet 0   No current facility-administered medications on file prior to visit.     There were no vitals taken for this visit.      Objective:   Physical Exam  See objective section.     Assessment & Plan:

## 2018-06-04 ENCOUNTER — Telehealth: Payer: Self-pay | Admitting: Medical

## 2018-06-04 ENCOUNTER — Telehealth: Payer: Self-pay | Admitting: *Deleted

## 2018-06-04 ENCOUNTER — Other Ambulatory Visit: Payer: Self-pay

## 2018-06-04 ENCOUNTER — Other Ambulatory Visit: Payer: 59

## 2018-06-04 ENCOUNTER — Telehealth: Payer: Self-pay

## 2018-06-04 DIAGNOSIS — Z79899 Other long term (current) drug therapy: Secondary | ICD-10-CM

## 2018-06-04 MED ORDER — MELOXICAM 7.5 MG PO TABS: 7.5000 mg | ORAL_TABLET | Freq: Every day | ORAL | 0 refills | Status: DC

## 2018-06-04 NOTE — Telephone Encounter (Signed)
Sent rx to pt pharmacy. But asked her to check bp at pharmacy and if less than 140/90 can start the meloxicam. Asked MA to have pt update [place] on her bp level as well.

## 2018-06-04 NOTE — Telephone Encounter (Signed)
PA approved.

## 2018-06-04 NOTE — Telephone Encounter (Signed)
PA initiated via Covermymeds; KEY: [id number]. Awaiting determination.

## 2018-06-04 NOTE — Telephone Encounter (Signed)
Patient called and stated that she was to have a pain and anti-inflammatory Rx sent in yesterday to pharmacy after her visit. She reports that when she got to the pharmacy that they tried to give her [place] record of prescribing is 09/10/2017.?] She is inquiring as to the anti-inflammatory Rx. Please advise regarding this medication/SLS 04/24

## 2018-06-04 NOTE — Telephone Encounter (Signed)
Spoke with pt. Notified pt to check BP and call back to give [place] reading.

## 2018-06-04 NOTE — Telephone Encounter (Signed)
I was going to prescribe meloxicam. Pt did not check her bp on day of virtual visit. I wanted her to check bp and let me know what level it was. If bp was less than 140/90 then I was going to prescribe low dose meloxicam.   I had printed oxycodone for her. She picked that up the other day. Looks like prior authorization issue. It is not under formulary. They might pay for oxy with tylenol but patient states that make her nauseu and fatigue.(this is odd symptom with addition of tylenol)?   Norco not adequate for pain.  Not sure what is on formularly. Prior authroization in progress.  Regarding famive. That looks like old rx prescirbed in august. Maybe she never picked that up?

## 2018-06-07 LAB — PAIN MGMT, PROFILE 8 W/CONF, U
6 Acetylmorphine: NEGATIVE ng/mL
Alcohol Metabolites: NEGATIVE ng/mL (ref <500)
Amphetamines: NEGATIVE ng/mL
Benzodiazepines: NEGATIVE ng/mL
Buprenorphine, Urine: NEGATIVE ng/mL
Cocaine Metabolite: NEGATIVE ng/mL
Codeine: NEGATIVE ng/mL
Creatinine: 184 mg/dL
Hydrocodone: 419 ng/mL
Hydromorphone: 151 ng/mL
MDMA: NEGATIVE ng/mL
Marijuana Metabolite: NEGATIVE ng/mL
Morphine: NEGATIVE ng/mL
Norhydrocodone: 679 ng/mL
Opiates: POSITIVE ng/mL
Oxidant: NEGATIVE ug/mL
Oxycodone: NEGATIVE ng/mL
pH: 5.8 (ref 4.5–9.0)

## 2018-06-08 ENCOUNTER — Encounter: Payer: Self-pay | Admitting: Medical

## 2018-06-08 ENCOUNTER — Ambulatory Visit: Payer: Self-pay

## 2018-06-08 ENCOUNTER — Ambulatory Visit (INDEPENDENT_AMBULATORY_CARE_PROVIDER_SITE_OTHER): Payer: 59 | Admitting: Medical

## 2018-06-08 ENCOUNTER — Telehealth: Payer: Self-pay | Admitting: Medical

## 2018-06-08 ENCOUNTER — Other Ambulatory Visit: Payer: Self-pay

## 2018-06-08 VITALS — BP 137/95

## 2018-06-08 DIAGNOSIS — R82998 Other abnormal findings in urine: Secondary | ICD-10-CM | POA: Diagnosis not present

## 2018-06-08 DIAGNOSIS — M7989 Other specified soft tissue disorders: Secondary | ICD-10-CM | POA: Diagnosis not present

## 2018-06-08 DIAGNOSIS — I10 Essential (primary) hypertension: Secondary | ICD-10-CM | POA: Diagnosis not present

## 2018-06-08 DIAGNOSIS — R600 Localized edema: Secondary | ICD-10-CM | POA: Diagnosis not present

## 2018-06-08 MED ORDER — TIZANIDINE HCL 4 MG PO TABS: 4.0000 mg | ORAL_TABLET | Freq: Every day | ORAL | 0 refills | Status: DC

## 2018-06-08 NOTE — Telephone Encounter (Signed)
Virtual Visit scheduled °

## 2018-06-08 NOTE — Telephone Encounter (Signed)
Copied from [place] [id number]. Topic: Quick Communication - Rx Refill/Question >> Jun 08, 2018 11:34 AM Selinda Flavin B, NT wrote: Medication: tiZANidine (ZANAFLEX) 4 MG tablet  Has the patient contacted their pharmacy? yes (Agent: If no, request that the patient contact the pharmacy for the refill.) (Agent: If yes, when and what did the pharmacy advise?)  Preferred Pharmacy (with phone number or street name): [facility] [place], [place]: Please be advised that RX refills may take up to 3 business days. We ask that you follow-up with your pharmacy.

## 2018-06-08 NOTE — Patient Instructions (Signed)
Patient did have various complaints today.  Among these were the bilateral upper extremity swelling and lower extremity swelling.  On inspection the video service it only looks like her left upper extremity office was swollen.  She agrees this is the area that looks more prominent.  She denies any chest pain or popliteal pain.  No shortness of breath.  Walking long distances without any issues.  Also some occasional left shoulder pain while she works.  No chest pain reported.  She wants refill of muscle relaxant to to help with left shoulder pain.  And note no associated cardiac signs or symptoms reported.  Patient has history of high blood pressure but she did not check her blood pressure today.  Also patient reports that her urine looks darker/more concentrated despite the fact that she is drinking water.  Also she notes with the increased intake of water the next legs are swelling more.  Decided to refill patient's muscle relaxant but advised her only to use it at night.  If patient has any changing features of left shoulder pain then let us know.  Or she has any associated symptoms let us know as well.  For left upper extremity swelling decided to order left upper extremity ultrasound.  Ultrasound was booked today but patient will be scheduled for tomorrow.  Radiology is going to contact patient this afternoon.  Also asked patient that if she has any asymmetric swelling of either leg or any popliteal region pain let me know as I would get ultrasound of either leg in that event.  Advised patient to decrease salt intake.  Check her blood pressure notify me of blood pressure reading tomorrow.  If her legs are still swollen considering giving low dose of HCTZ.  Presently I do not want to do this without knowing what her blood pressure readings are.  Follow-up date to be determined after lab review.

## 2018-06-08 NOTE — Telephone Encounter (Signed)
Rx zanaflex sent to pharmacy.

## 2018-06-08 NOTE — Telephone Encounter (Signed)
Pt called stating that she was experiencing swelling to her legs hands abdomin. She states really all over.  She states that her legs are extremely tight. She has a hx of swelling.  She states she was in the office Friday. She has HX of heart failure kidney issues. She denies chest pain and SOB. She denies missing any scheduled medication. Care advice read to patient. Pt verbalized understanding of instructions. Call was transferred to office for appointment. E-mail verified.  Reason for Disposition . SEVERE leg swelling (e.g., swelling extends above knee, entire leg is swollen, weeping fluid)  Answer Assessment - Initial Assessment Questions 1. ONSET: "When did the swelling start?" (e.g., minutes, hours, days)     friday 2. LOCATION: "What part of the leg is swollen?"  "Are both legs swollen or just one leg?"     Both legs hands abdomin  3. SEVERITY: "How bad is the swelling?" (e.g., localized; mild, moderate, severe)  - Localized - small area of swelling localized to one leg  - MILD pedal edema - swelling limited to foot and ankle, pitting edema < 1/4 inch (6 mm) deep, rest and elevation eliminate most or all swelling  - MODERATE edema - swelling of lower leg to knee, pitting edema > 1/4 inch (6 mm) deep, rest and elevation only partially reduce swelling  - SEVERE edema - swelling extends above knee, facial or hand swelling present      Severe pitting 4. REDNESS: "Does the swelling look red or infected?"    No 5. PAIN: "Is the swelling painful to touch?" If so, ask: "How painful is it?"   (Scale 1-10; mild, moderate or severe)    tight 6. FEVER: "Do you have a fever?" If so, ask: "What is it, how was it measured, and when did it start?"     No 7. CAUSE: "What do you think is causing the leg swelling?"     unsure 8. MEDICAL HISTORY: "Do you have a history of heart failure, kidney disease, liver failure, or cancer?"     All of the above 9. RECURRENT SYMPTOM: "Have you had leg swelling  before?" If so, ask: "When was the last time?" "What happened that time?"    No just left foot after surgery 10. OTHER SYMPTOMS: "Do you have any other symptoms?" (e.g., chest pain, difficulty breathing)       no 11. PREGNANCY: "Is there any chance you are pregnant?" "When was your last menstrual period?"       No  Protocols used: LEG SWELLING AND EDEMA-A-AH

## 2018-06-08 NOTE — Progress Notes (Signed)
Subjective:    Patient ID: [name], female    DOB: 29-Feb-1965, 53 y.o.   MRN: [id number]  HPI  Virtual Visit via Video Note  I connected with [name] on 06/08/18 at  1:40 PM EDT by a video enabled telemedicine application and verified that I am speaking with the correct person using two identifiers.   I discussed the limitations of evaluation and management by telemedicine and the availability of in person appointments. The patient expressed understanding and agreed to proceed.  Pt was at work and I was at home.    History of Present Illness: Pt states she has both upper and lower extremity slight swelling. Neither of her arms hurt. She does note some left shoulder discomfort like muscle pull.  No cardiac symptoms reported.  Pt states leg swelling and arm swelling occur for about one week. Pt has eating some salty foods recently,  No shortness of breath,. She has been walking on weekends 3-4 miles. States her legs feel tight. Pt has not been weighing herself. No pain behind her knees. Pt has been drinking a lot fluid. She thinks urinating less. Pt states urine seems darker than normal.  She states that her urine looks a bit darker recently despite increasing amount of water that she has been drinking.       Observations/Objective:  No acute distress.  Left shoulder- no pain on range of motion but states some pain anterior aspect.  Left pectoralis. Upper outer pain on palpation medial to shoulder when she press.  On inspection- left forearm mild swollen compared to rt side. calfs- symmetric on inspectoin per pt. On standing on tip toes. No popliteal. Pain.   Assessment and Plan: Patient did have various complaints today.  Among these were the bilateral upper extremity swelling and lower extremity swelling.  On inspection the video service it only looks like her left upper extremity office was swollen.  She agrees this is the area that looks more  prominent.  She denies any chest pain or popliteal pain.  No shortness of breath.  Walking long distances without any issues.  Also some occasional left shoulder pain while she works.  No chest pain reported.  She wants refill of muscle relaxant to to help with left shoulder pain.  And note no associated cardiac signs or symptoms reported.  Patient has history of high blood pressure but she did not check her blood pressure today.  Also patient reports that her urine looks darker/more concentrated despite the fact that she is drinking water.  Also she notes with the increased intake of water the next legs are swelling more.  Decided to refill patient's muscle relaxant but advised her only to use it at night.  If patient has any changing features of left shoulder pain then let us know.  Or she has any associated symptoms let us know as well.  For left upper extremity swelling decided to order left upper extremity ultrasound.  Ultrasound was booked today but patient will be scheduled for tomorrow.  Radiology is going to contact patient this afternoon.  Also asked patient that if she has any asymmetric swelling of either leg or any popliteal region pain let me know as I would get ultrasound of either leg in that event.  Advised patient to decrease salt intake.  Check her blood pressure notify me of blood pressure reading tomorrow.  If her legs are still swollen considering giving low dose of HCTZ.  Presently I  do not want to do this without knowing what her blood pressure readings are.  Follow-up date to be determined after lab review.  Follow Up Instructions:    I discussed the assessment and treatment plan with the patient. The patient was provided an opportunity to ask questions and all were answered. The patient agreed with the plan and demonstrated an understanding of the instructions.   The patient was advised to call back or seek an in-person evaluation if the symptoms worsen or if the  condition fails to improve as anticipated.     Mackie Pai, PA-C    Review of Systems  Constitutional: Negative for chills, fatigue and fever.  Respiratory: Negative for cough, chest tightness, shortness of breath and wheezing.   Cardiovascular: Negative for chest pain and palpitations.  Gastrointestinal: Negative for abdominal pain.  Musculoskeletal:       Pedal edema.  Left arm swelling.  Neurological: Negative for dizziness, light-headedness and headaches.  Hematological: Negative for adenopathy. Does not bruise/bleed easily.  Psychiatric/Behavioral: Negative for behavioral problems and decreased concentration.   Past Medical History:  Diagnosis Date  . Cervical cancer ([place])    had surgery in 2001.  . Coronary artery disease    30% lesions noted  . Depression   . Diabetes mellitus   . GERD (gastroesophageal reflux disease)   . Hyperlipidemia   . Hypertension   . Neuropathy      Social History   Socioeconomic History  . Marital status: Single    Spouse name: Not on file  . Number of children: Not on file  . Years of education: Not on file  . Highest education level: Not on file  Occupational History  . Not on file  Social Needs  . Financial resource strain: Not on file  . Food insecurity:    Worry: Not on file    Inability: Not on file  . Transportation needs:    Medical: Not on file    Non-medical: Not on file  Tobacco Use  . Smoking status: Never Smoker  . Smokeless tobacco: Never Used  Substance and Sexual Activity  . Alcohol use: Yes    Comment: occ. Very rare.   . Drug use: No  . Sexual activity: Not on file  Lifestyle  . Physical activity:    Days per week: Not on file    Minutes per session: Not on file  . Stress: Not on file  Relationships  . Social connections:    Talks on phone: Not on file    Gets together: Not on file    Attends religious service: Not on file    Active member of club or organization: Not on file    Attends  meetings of clubs or organizations: Not on file    Relationship status: Not on file  . Intimate partner violence:    Fear of current or ex partner: Not on file    Emotionally abused: Not on file    Physically abused: Not on file    Forced sexual activity: Not on file  Other Topics Concern  . Not on file  Social History Narrative  . Not on file    Past Surgical History:  Procedure Laterality Date  . ABDOMINAL HYSTERECTOMY     partial  . CHOLECYSTECTOMY      Family History  Problem Relation Age of Onset  . Diabetes Mother   . Diabetes Father     Allergies  Allergen Reactions  . Insulin Aspart Other (  See Comments) and Swelling    adverse reaction to Novolog   . Latex Itching and Rash  . Morphine Itching and Rash    Current Outpatient Medications on File Prior to Visit  Medication Sig Dispense Refill  . Albuterol Sulfate (PROAIR RESPICLICK) 034 (90 Base) MCG/ACT AEPB Inhale 2 puffs into the lungs at bedtime as needed for shortness of breath.    Marland Kitchen amLODipine (NORVASC) 5 MG tablet Take 1 tablet by mouth once daily 90 tablet 0  . aspirin 81 MG tablet Take 81 mg by mouth daily.    Marland Kitchen atorvastatin (LIPITOR) 80 MG tablet Take 1 tablet (80 mg total) by mouth daily. 90 tablet 0  . carvedilol (COREG) 25 MG tablet Take 1 tablet (25 mg total) by mouth 2 (two) times daily with a meal. 60 tablet 11  . cephALEXin (KEFLEX) 500 MG capsule Take 1 capsule (500 mg total) by mouth 2 (two) times daily. 20 capsule 0  . Cholecalciferol (VITAMIN D3) 400 units CAPS Take 400 mg by mouth daily.    . cimetidine (TAGAMET) 400 MG tablet Take 1 tablet (400 mg total) by mouth 2 (two) times daily. 60 tablet 3  . dapagliflozin propanediol (FARXIGA) 10 MG TABS tablet Take 10 mg by mouth daily. 90 tablet 1  . doxycycline (VIBRA-TABS) 100 MG tablet Take 1 tablet (100 mg total) by mouth 2 (two) times daily. Can give caps or generic. 6 tablet 0  . famciclovir (FAMVIR) 500 MG tablet Take 1 tablet (500 mg total) by  mouth 3 (three) times daily. 21 tablet 0  . fluticasone (FLONASE) 50 MCG/ACT nasal spray Place 2 sprays into the nose daily. 16 g 0  . fluticasone furoate-vilanterol (BREO ELLIPTA) 100-25 MCG/INH AEPB Inhale 1 puff into the lungs daily.    Marland Kitchen gabapentin (NEURONTIN) 300 MG capsule Take 300 mg by mouth 4 (four) times daily as needed for pain.    Marland Kitchen HYDROcodone-acetaminophen (NORCO) 5-325 MG tablet 1-2 tab po q 6 hours as needed sever pain. 30 tablet 0  . insulin degludec (TRESIBA FLEXTOUCH) 100 UNIT/ML SOPN FlexTouch Pen Inject 40 Units into the skin daily at 10 pm.    . meloxicam (MOBIC) 7.5 MG tablet Take 1 tablet (7.5 mg total) by mouth daily. 30 tablet 0  . metoCLOPramide (REGLAN) 10 MG tablet Take 1 tablet (10 mg total) by mouth every 8 (eight) hours as needed for nausea. 20 tablet 0  . omeprazole (PRILOSEC) 20 MG capsule Take 1 capsule (20 mg total) by mouth daily. 30 capsule 3  . oxyCODONE (OXY IR/ROXICODONE) 5 MG immediate release tablet 1 tab po twice daily as needed for severe pain 60 tablet 0  . polyethylene glycol (MIRALAX / GLYCOLAX) packet Take 17 g by mouth daily. 14 each 0  . promethazine (PHENERGAN) 12.5 MG tablet Take 1 tablet (12.5 mg total) by mouth every 8 (eight) hours as needed for nausea or vomiting. 20 tablet 0  . ranitidine (ZANTAC) 150 MG capsule Take 1 capsule (150 mg total) by mouth 2 (two) times daily. 60 capsule 3  . senna-docusate (SENOKOT-S) 8.6-50 MG tablet Take 2 tablets by mouth 2 (two) times daily. 60 tablet 1  . spironolactone (ALDACTONE) 25 MG tablet Take 1 tablet (25 mg total) by mouth daily. 30 tablet 3  . sucralfate (CARAFATE) 1 g tablet Take 1 tablet (1 g total) by mouth 4 (four) times daily -  with meals and at bedtime. 120 tablet 0  . tiZANidine (ZANAFLEX) 4 MG tablet Take  1 tablet (4 mg total) by mouth at bedtime. 10 tablet 0   No current facility-administered medications on file prior to visit.     There were no vitals taken for this visit.       Objective:   Physical Exam        Assessment & Plan:

## 2018-06-09 ENCOUNTER — Other Ambulatory Visit (INDEPENDENT_AMBULATORY_CARE_PROVIDER_SITE_OTHER): Payer: 59

## 2018-06-09 ENCOUNTER — Ambulatory Visit (HOSPITAL_BASED_OUTPATIENT_CLINIC_OR_DEPARTMENT_OTHER): Payer: 59

## 2018-06-09 ENCOUNTER — Encounter: Payer: Self-pay | Admitting: Medical

## 2018-06-09 ENCOUNTER — Other Ambulatory Visit: Payer: Self-pay

## 2018-06-09 DIAGNOSIS — R600 Localized edema: Secondary | ICD-10-CM

## 2018-06-09 DIAGNOSIS — R82998 Other abnormal findings in urine: Secondary | ICD-10-CM

## 2018-06-09 LAB — COMPREHENSIVE METABOLIC PANEL
ALT: 17 U/L (ref 0–35)
AST: 19 U/L (ref 0–37)
Albumin: 4 g/dL (ref 3.5–5.2)
Alkaline Phosphatase: 114 U/L (ref 39–117)
BUN: 13 mg/dL (ref 6–23)
CO2: 26 mEq/L (ref 19–32)
Calcium: 9 mg/dL (ref 8.4–10.5)
Chloride: 107 mEq/L (ref 96–112)
Creatinine, Ser: 1.16 mg/dL (ref 0.40–1.20)
GFR: 59.03 mL/min — ABNORMAL LOW (ref >60.00)
Glucose, Bld: 167 mg/dL — ABNORMAL HIGH (ref 70–99)
Potassium: 4.1 mEq/L (ref 3.5–5.1)
Sodium: 141 mEq/L (ref 135–145)
Total Bilirubin: 0.3 mg/dL (ref 0.2–1.2)
Total Protein: 7.2 g/dL (ref 6.0–8.3)

## 2018-06-09 LAB — BRAIN NATRIURETIC PEPTIDE: Pro B Natriuretic peptide (BNP): 22 pg/mL (ref 0.0–100.0)

## 2018-06-09 NOTE — Addendum Note (Signed)
Addended by: Caffie Pinto on: 06/09/2018 09:44 AM   Modules accepted: Orders

## 2018-06-09 NOTE — Addendum Note (Signed)
Addended by: Caffie Pinto on: 06/09/2018 09:43 AM   Modules accepted: Orders

## 2018-06-10 ENCOUNTER — Telehealth: Payer: Self-pay

## 2018-06-10 MED ORDER — HYDROCHLOROTHIAZIDE 12.5 MG PO CAPS: ORAL_CAPSULE | ORAL | 0 refills | Status: DC

## 2018-06-10 NOTE — Telephone Encounter (Signed)
Explain to pt her gfr/kidney function is on lower end. She may have some decrease kidney function or maybe dehydrated. Diuretics can make dehydration worse or adversley effect kidneys. Hesitant to rx hctz and I explained on lab results not and jasmine may call her and explain in more detail. I will give only 3 tabs of hctz to take daily for 3 days to see if will help reduce pedal edema

## 2018-06-10 NOTE — Addendum Note (Signed)
Addended by: Anabel Halon on: 06/10/2018 07:30 PM   Modules accepted: Orders

## 2018-06-10 NOTE — Telephone Encounter (Signed)
Patient is having swelling and would like Diuretic started.

## 2018-06-10 NOTE — Telephone Encounter (Signed)
Copied from [place] [id number]. Topic: General - Inquiry >> Jun 10, 2018  9:13 AM Rainey Pines A wrote: Reason for CRM: Patient would like callback from nurse. Patient stated that she was instructed to callback if swelling persisted and to see if Saguier wants to do a water pill.

## 2018-06-11 ENCOUNTER — Ambulatory Visit (HOSPITAL_BASED_OUTPATIENT_CLINIC_OR_DEPARTMENT_OTHER): Payer: 59

## 2018-06-11 ENCOUNTER — Other Ambulatory Visit: Payer: Self-pay | Admitting: Medical

## 2018-06-11 NOTE — Telephone Encounter (Signed)
Called patient with advise and order from E. Saguier. Patient states she will pick up medication.

## 2018-06-18 ENCOUNTER — Ambulatory Visit (HOSPITAL_BASED_OUTPATIENT_CLINIC_OR_DEPARTMENT_OTHER)
Admission: RE | Admit: 2018-06-18 | Discharge: 2018-06-18 | Disposition: A | Discharge status: Home / Self Care | Payer: 59 | Source: Ambulatory Visit | Attending: Medical | Admitting: Medical

## 2018-06-18 ENCOUNTER — Other Ambulatory Visit: Payer: Self-pay

## 2018-06-18 DIAGNOSIS — M7989 Other specified soft tissue disorders: Secondary | ICD-10-CM | POA: Insufficient documentation

## 2018-06-24 ENCOUNTER — Other Ambulatory Visit: Payer: Self-pay | Admitting: Medical

## 2018-06-24 NOTE — Telephone Encounter (Signed)
Pt is requesting refill on tizanidine last filled 06/08/18. Please Advise

## 2018-06-24 NOTE — Telephone Encounter (Signed)
I refilled pt zanaflex

## 2018-07-08 ENCOUNTER — Other Ambulatory Visit: Payer: Self-pay | Admitting: Medical

## 2018-07-31 ENCOUNTER — Other Ambulatory Visit: Payer: Self-pay | Admitting: Medical

## 2018-08-04 ENCOUNTER — Other Ambulatory Visit: Payer: Self-pay | Admitting: Medical

## 2018-08-28 ENCOUNTER — Other Ambulatory Visit: Payer: Self-pay | Admitting: Medical

## 2018-09-01 ENCOUNTER — Other Ambulatory Visit: Payer: Self-pay | Admitting: Medical

## 2018-09-01 NOTE — Telephone Encounter (Signed)
Pt has been experiencing painful muscle spasms that are leaving parts of her legs swollen. Pt is requesting a higher dosage or further examination.  Please advise: [phone]

## 2018-09-01 NOTE — Telephone Encounter (Signed)
Please advise 

## 2018-09-01 NOTE — Telephone Encounter (Signed)
Pt called to check status for meds. She has been out since the weekend.

## 2018-09-02 ENCOUNTER — Ambulatory Visit (INDEPENDENT_AMBULATORY_CARE_PROVIDER_SITE_OTHER): Payer: 59 | Admitting: Medical

## 2018-09-02 ENCOUNTER — Other Ambulatory Visit: Payer: Self-pay

## 2018-09-02 DIAGNOSIS — R252 Cramp and spasm: Secondary | ICD-10-CM

## 2018-09-02 DIAGNOSIS — M545 Low back pain, unspecified: Secondary | ICD-10-CM

## 2018-09-02 MED ORDER — TIZANIDINE HCL 4 MG PO CAPS: ORAL_CAPSULE | ORAL | 2 refills | Status: DC

## 2018-09-02 NOTE — Patient Instructions (Addendum)
For recent bilateral calf cramping will get stat cmp and magnsieum today. Will follow results and update you. Recommend taking it easy if possible/not working and stretch calves. If studies negative could try over the counter product such as hylands leg cramps. If electrolyte abnormality then goal to correct.  If any pain in popliteal area, calf asymmety or sob let us know. Any severe type changes then ED evaluation.  For hx of back pain and recent calf cramps also refilling your zanaflex to use at night if needed.  Follow up date to be determined after lab review.  Scheduled pt for labs today and explained importance of getting studies today.

## 2018-09-02 NOTE — Progress Notes (Signed)
Subjective:    Patient ID: [name], female    DOB: Apr 28, 1965, 52 y.o.   MRN: [id number]  HPI  Virtual Visit via Video Note  I connected with [name] on 09/02/18 at 11:00 AM EDT by a video enabled telemedicine application and verified that I am speaking with the correct person using two identifiers.  Location: Patient: home Provider: home.   I discussed the limitations of evaluation and management by telemedicine and the availability of in person appointments. The patient expressed understanding and agreed to proceed.  Pt did not check bp or pulse today. No machine.  History of Present Illness:  Pt states she has been having muscle spasm/leg cramps on and off for past 3-4 nights. They occur usually early am around 3 am-4 am. One cramp lasted a while almost an hour. Some back to back cramping.  Next day after cramping calf were sore. No popliteal pain.  In April I rx'd 3 days hctz for calf swelling but not taking on daily basis and non recently. She is working 2 jobs. One job she walks constantly.  Pt states recent bs reading 120 range. One time sugar was 54. All other time sugars daily in low 120 range. No low sugars when had cramps.Pt sees endocrinologist with [facility] did start potassium tabs about 4 weeks ago.   Cramping only for 4 days. During day no cramps. Pt states no consecutive days off coming up.  Pt calfs are symmetric per her report.   Pt has hx of back pain. She still gets occasional pain. Muscle relaxant help.  No sob.no chest pain. No popliteal pain.  Pt thinks needs muscle relaxants needed.    Observations/Objective: General-no acute distress, pleasant, oriented. Lungs- on inspection lungs appear unlabored. Neck- no tracheal deviation or jvd on inspection. Neuro- gross motor function appears intact.  Assessment and Plan: For recent bilateral calf cramping will get stat cmp and magnsieum today. Will follow results and  update you. Recommend taking it easy if possible/not working and stretch calves. If studies negative could try over the counter product such as hylands leg cramps. If electrolyte abnormality then goal to correct.  If any pain in popliteal area, calf asymmety or sob let us know. Any severe type changes then ED evaluation.  For hx of back pain and recent calf cramps also refilling your zanaflex to use at night if needed.  Follow up date to be determined after lab review.  Scheduled pt for labs today and explained importance of getting studies today.  Mackie Pai, PA-C  Follow Up Instructions:    I discussed the assessment and treatment plan with the patient. The patient was provided an opportunity to ask questions and all were answered. The patient agreed with the plan and demonstrated an understanding of the instructions.   The patient was advised to call back or seek an in-person evaluation if the symptoms worsen or if the condition fails to improve as anticipated.  I provided 25+  minutes of non-face-to-face time during this encounter.   Mackie Pai, PA-C    Review of Systems  Constitutional: Negative for chills, fatigue and fever.  Respiratory: Negative for cough, chest tightness, shortness of breath and wheezing.   Cardiovascular: Negative for chest pain and palpitations.  Gastrointestinal: Negative for abdominal pain.  Endocrine: Negative for polydipsia, polyphagia and polyuria.  Genitourinary: Negative for dysuria.  Musculoskeletal: Negative for back pain.       Muscle cramps. See hpi.  Skin: Negative for rash.  Neurological: Negative for dizziness and weakness.  Hematological: Negative for adenopathy.  Psychiatric/Behavioral: Negative for behavioral problems and confusion.    Past Medical History:  Diagnosis Date  . Cervical cancer ([place])    had surgery in 2001.  . Coronary artery disease    30% lesions noted  . Depression   . Diabetes mellitus   . GERD  (gastroesophageal reflux disease)   . Hyperlipidemia   . Hypertension   . Neuropathy      Social History   Socioeconomic History  . Marital status: Single    Spouse name: Not on file  . Number of children: Not on file  . Years of education: Not on file  . Highest education level: Not on file  Occupational History  . Not on file  Social Needs  . Financial resource strain: Not on file  . Food insecurity    Worry: Not on file    Inability: Not on file  . Transportation needs    Medical: Not on file    Non-medical: Not on file  Tobacco Use  . Smoking status: Never Smoker  . Smokeless tobacco: Never Used  Substance and Sexual Activity  . Alcohol use: Yes    Comment: occ. Very rare.   . Drug use: No  . Sexual activity: Not on file  Lifestyle  . Physical activity    Days per week: Not on file    Minutes per session: Not on file  . Stress: Not on file  Relationships  . Social Herbalist on phone: Not on file    Gets together: Not on file    Attends religious service: Not on file    Active member of club or organization: Not on file    Attends meetings of clubs or organizations: Not on file    Relationship status: Not on file  . Intimate partner violence    Fear of current or ex partner: Not on file    Emotionally abused: Not on file    Physically abused: Not on file    Forced sexual activity: Not on file  Other Topics Concern  . Not on file  Social History Narrative  . Not on file    Past Surgical History:  Procedure Laterality Date  . ABDOMINAL HYSTERECTOMY     partial  . CHOLECYSTECTOMY      Family History  Problem Relation Age of Onset  . Diabetes Mother   . Diabetes Father     Allergies  Allergen Reactions  . Insulin Aspart Other (See Comments) and Swelling    adverse reaction to Novolog   . Latex Itching and Rash  . Morphine Itching and Rash    Current Outpatient Medications on File Prior to Visit  Medication Sig Dispense Refill   . Albuterol Sulfate (PROAIR RESPICLICK) 128 (90 Base) MCG/ACT AEPB Inhale 2 puffs into the lungs at bedtime as needed for shortness of breath.    Marland Kitchen amLODipine (NORVASC) 5 MG tablet Take 1 tablet by mouth once daily 90 tablet 0  . aspirin 81 MG tablet Take 81 mg by mouth daily.    Marland Kitchen atorvastatin (LIPITOR) 80 MG tablet Take 1 tablet (80 mg total) by mouth daily. 90 tablet 1  . carvedilol (COREG) 25 MG tablet TAKE 1 TABLET BY MOUTH TWICE DAILY WITH MEALS 60 tablet 0  . cephALEXin (KEFLEX) 500 MG capsule Take 1 capsule (500 mg total) by mouth 2 (two) times daily. [place]  capsule 0  . Cholecalciferol (VITAMIN D3) 400 units CAPS Take 400 mg by mouth daily.    . cimetidine (TAGAMET) 400 MG tablet Take 1 tablet (400 mg total) by mouth 2 (two) times daily. 60 tablet 3  . doxycycline (VIBRA-TABS) 100 MG tablet Take 1 tablet (100 mg total) by mouth 2 (two) times daily. Can give caps or generic. 6 tablet 0  . famciclovir (FAMVIR) 500 MG tablet Take 1 tablet (500 mg total) by mouth 3 (three) times daily. 21 tablet 0  . FARXIGA 10 MG TABS tablet Take 1 tablet by mouth once daily 90 tablet 0  . fluticasone furoate-vilanterol (BREO ELLIPTA) 100-25 MCG/INH AEPB Inhale 1 puff into the lungs daily.    Marland Kitchen gabapentin (NEURONTIN) 300 MG capsule Take 300 mg by mouth 4 (four) times daily as needed for pain.    . hydrochlorothiazide (MICROZIDE) 12.5 MG capsule 1 tab po daily for 3 days. 3 capsule 0  . HYDROcodone-acetaminophen (NORCO) 5-325 MG tablet 1-2 tab po q 6 hours as needed sever pain. 30 tablet 0  . insulin degludec (TRESIBA FLEXTOUCH) 100 UNIT/ML SOPN FlexTouch Pen Inject 40 Units into the skin daily at 10 pm.    . meloxicam (MOBIC) 7.5 MG tablet Take 1 tablet by mouth once daily 30 tablet 0  . metoCLOPramide (REGLAN) 10 MG tablet Take 1 tablet (10 mg total) by mouth every 8 (eight) hours as needed for nausea. 20 tablet 0  . omeprazole (PRILOSEC) 20 MG capsule Take 1 capsule (20 mg total) by mouth daily. 30 capsule 3   . oxyCODONE (OXY IR/ROXICODONE) 5 MG immediate release tablet 1 tab po twice daily as needed for severe pain 60 tablet 0  . polyethylene glycol (MIRALAX / GLYCOLAX) packet Take 17 g by mouth daily. 14 each 0  . promethazine (PHENERGAN) 12.5 MG tablet Take 1 tablet (12.5 mg total) by mouth every 8 (eight) hours as needed for nausea or vomiting. 20 tablet 0  . ranitidine (ZANTAC) 150 MG capsule Take 1 capsule (150 mg total) by mouth 2 (two) times daily. 60 capsule 3  . senna-docusate (SENOKOT-S) 8.6-50 MG tablet Take 2 tablets by mouth 2 (two) times daily. 60 tablet 1  . spironolactone (ALDACTONE) 25 MG tablet Take 1 tablet (25 mg total) by mouth daily. 30 tablet 3  . sucralfate (CARAFATE) 1 g tablet Take 1 tablet (1 g total) by mouth 4 (four) times daily -  with meals and at bedtime. 120 tablet 0  . tiZANidine (ZANAFLEX) 4 MG tablet TAKE 1 TABLET BY MOUTH AT BEDTIME 10 tablet 0  . fluticasone (FLONASE) 50 MCG/ACT nasal spray Place 2 sprays into the nose daily. 16 g 0   No current facility-administered medications on file prior to visit.     There were no vitals taken for this visit.      Objective:   Physical Exam        Assessment & Plan:

## 2018-09-11 ENCOUNTER — Other Ambulatory Visit: Payer: Self-pay | Admitting: Medical

## 2018-10-08 ENCOUNTER — Other Ambulatory Visit: Payer: Self-pay | Admitting: Medical

## 2018-10-08 NOTE — Telephone Encounter (Signed)
Pt is calling in to follow up on refill request for   FARXIGA 10 MG TABS tablet spironolactone (ALDACTONE) 25 MG tablet  carvedilol (COREG) 25 MG tablet   Pharmacy: [facility] [id number] - [place], [place] [address]

## 2018-10-19 NOTE — Telephone Encounter (Signed)
Pt states that the pharmacy still has not received these as of yet, please advise.

## 2018-10-21 ENCOUNTER — Other Ambulatory Visit: Payer: Self-pay | Admitting: Medical

## 2018-10-21 NOTE — Telephone Encounter (Signed)
Rx's refilled on 10/11/2018.

## 2018-11-03 ENCOUNTER — Other Ambulatory Visit: Payer: Self-pay | Admitting: Medical

## 2018-11-04 ENCOUNTER — Telehealth: Payer: Self-pay | Admitting: *Deleted

## 2018-11-04 NOTE — Telephone Encounter (Signed)
Pt needs appointment to fill out paperwork.

## 2018-11-04 NOTE — Telephone Encounter (Signed)
Copied from [place] [id number]. Topic: General - Other >> Nov 03, 2018  4:13 PM Keene Breath wrote: Reason for CRM: Patient called to ask that her FMLA papers be updated.  Please call patient to discuss.  CB# [phone]

## 2018-11-05 ENCOUNTER — Other Ambulatory Visit: Payer: Self-pay

## 2018-11-10 ENCOUNTER — Other Ambulatory Visit: Payer: Self-pay | Admitting: Medical

## 2018-11-10 ENCOUNTER — Ambulatory Visit (INDEPENDENT_AMBULATORY_CARE_PROVIDER_SITE_OTHER): Payer: 59 | Admitting: Medical

## 2018-11-10 ENCOUNTER — Other Ambulatory Visit: Payer: Self-pay

## 2018-11-10 ENCOUNTER — Encounter: Payer: Self-pay | Admitting: Medical

## 2018-11-10 ENCOUNTER — Other Ambulatory Visit (HOSPITAL_COMMUNITY)
Admission: RE | Admit: 2018-11-10 | Discharge: 2018-11-10 | Disposition: A | Discharge status: Home / Self Care | Payer: 59 | Source: Ambulatory Visit | Attending: Medical | Admitting: Medical

## 2018-11-10 VITALS — BP 146/95 | HR 81 | Temp 96.8°F | Resp 16 | Ht 72.0 in | Wt 189.6 lb

## 2018-11-10 DIAGNOSIS — J309 Allergic rhinitis, unspecified: Secondary | ICD-10-CM | POA: Diagnosis not present

## 2018-11-10 DIAGNOSIS — Z23 Encounter for immunization: Secondary | ICD-10-CM

## 2018-11-10 DIAGNOSIS — M545 Low back pain, unspecified: Secondary | ICD-10-CM

## 2018-11-10 DIAGNOSIS — N898 Other specified noninflammatory disorders of vagina: Secondary | ICD-10-CM

## 2018-11-10 DIAGNOSIS — H9202 Otalgia, left ear: Secondary | ICD-10-CM

## 2018-11-10 DIAGNOSIS — R102 Pelvic and perineal pain: Secondary | ICD-10-CM

## 2018-11-10 DIAGNOSIS — R350 Frequency of micturition: Secondary | ICD-10-CM

## 2018-11-10 LAB — POC URINALSYSI DIPSTICK (AUTOMATED)
Bilirubin, UA: NEGATIVE
Blood, UA: NEGATIVE
Glucose, UA: POSITIVE — AB
Ketones, UA: NEGATIVE
Leukocytes, UA: NEGATIVE
Nitrite, UA: POSITIVE
Protein, UA: POSITIVE — AB
Spec Grav, UA: 1.015 (ref 1.010–1.025)
Urobilinogen, UA: NEGATIVE E.U./dL — AB
pH, UA: 6 (ref 5.0–8.0)

## 2018-11-10 MED ORDER — HYDROCODONE-ACETAMINOPHEN 5-325 MG PO TABS: ORAL_TABLET | ORAL | 0 refills | Status: DC

## 2018-11-10 MED ORDER — AMLODIPINE BESYLATE 5 MG PO TABS: 5.0000 mg | ORAL_TABLET | Freq: Every day | ORAL | 0 refills | Status: DC

## 2018-11-10 MED ORDER — OXYCODONE HCL 5 MG PO TABS: ORAL_TABLET | ORAL | 0 refills | Status: DC

## 2018-11-10 MED ORDER — HYDROCHLOROTHIAZIDE 12.5 MG PO CAPS: ORAL_CAPSULE | ORAL | 0 refills | Status: DC

## 2018-11-10 MED ORDER — METRONIDAZOLE 500 MG PO TABS: 500.0000 mg | ORAL_TABLET | Freq: Three times a day (TID) | ORAL | 0 refills | Status: DC

## 2018-11-10 MED ORDER — CEPHALEXIN 500 MG PO CAPS: 500.0000 mg | ORAL_CAPSULE | Freq: Two times a day (BID) | ORAL | 0 refills | Status: DC

## 2018-11-10 MED ORDER — FLUTICASONE PROPIONATE 50 MCG/ACT NA SUSP: 2.0000 | Freq: Every day | NASAL | 1 refills | Status: DC

## 2018-11-10 MED ORDER — OMEPRAZOLE 20 MG PO CPDR: 20.0000 mg | DELAYED_RELEASE_CAPSULE | Freq: Every day | ORAL | 3 refills | Status: DC

## 2018-11-10 MED ORDER — ATORVASTATIN CALCIUM 80 MG PO TABS: 80.0000 mg | ORAL_TABLET | Freq: Every day | ORAL | 1 refills | Status: DC

## 2018-11-10 NOTE — Progress Notes (Signed)
Subjective:    Patient ID: [name], female    DOB: 1965-10-16, 53 y.o.   MRN: [id number]  HPI  Patient here for FMLA paper work back pain and diabetic neuropathy. No new changes.  Patient has been followed by pain management clinic for chronic lumbar back pain in the past. She reports back pain has been tolerable on her current medication regimen as long she does not over do it with work. She does electrical wiring and office cleaning jobs. She was furloughed due to the pandemic for 2 weeks which helped her rest and not be in as much pain.  She is also concerned with urinary symptoms today. She reports strong odor to urine that started about 2 weeks ago. No burning, itching. Reports increased frequency and frequency. Noted some yellow-whitish vaginal discharge about 2 weeks ago, none recently. She had some suprapubic discomfort earlier this week. Urine is darker and cloudy compared to her baseline. She has been trying to drink more water over the past few days and states that has somewhat helped with the odor and color.  LMP: 2001  Review of Systems  Constitutional: Negative for chills, fatigue and fever.  HENT: Positive for congestion and ear pain. Negative for sore throat.        Faint left ear pain for more than one week.  Respiratory: Negative for chest tightness and shortness of breath.   Cardiovascular: Negative for chest pain and palpitations.  Gastrointestinal: Positive for nausea. Negative for blood in stool, constipation, diarrhea and vomiting.       Mild nausea for the past 10 days  Endocrine: Negative for polydipsia, polyphagia and polyuria.  Genitourinary: Positive for frequency and urgency. Negative for dysuria, flank pain, hematuria and vaginal pain.       Vaginal discharge 2 weeks ago was yellow-white with fishy odor; states urine now smells fishy  Musculoskeletal: Positive for back pain.  Skin: Negative for rash.  Neurological: Negative for dizziness,  light-headedness and headaches.  Psychiatric/Behavioral: Negative for confusion. The patient is not nervous/anxious.     Past Medical History:  Diagnosis Date  . Cervical cancer ([place])    had surgery in 2001.  . Coronary artery disease    30% lesions noted  . Depression   . Diabetes mellitus   . GERD (gastroesophageal reflux disease)   . Hyperlipidemia   . Hypertension   . Neuropathy      Social History   Socioeconomic History  . Marital status: Single    Spouse name: Not on file  . Number of children: Not on file  . Years of education: Not on file  . Highest education level: Not on file  Occupational History  . Not on file  Social Needs  . Financial resource strain: Not on file  . Food insecurity    Worry: Not on file    Inability: Not on file  . Transportation needs    Medical: Not on file    Non-medical: Not on file  Tobacco Use  . Smoking status: Never Smoker  . Smokeless tobacco: Never Used  Substance and Sexual Activity  . Alcohol use: Yes    Comment: occ. Very rare.   . Drug use: No  . Sexual activity: Not on file  Lifestyle  . Physical activity    Days per week: Not on file    Minutes per session: Not on file  . Stress: Not on file  Relationships  . Social connections    Talks  on phone: Not on file    Gets together: Not on file    Attends religious service: Not on file    Active member of club or organization: Not on file    Attends meetings of clubs or organizations: Not on file    Relationship status: Not on file  . Intimate partner violence    Fear of current or ex partner: Not on file    Emotionally abused: Not on file    Physically abused: Not on file    Forced sexual activity: Not on file  Other Topics Concern  . Not on file  Social History Narrative  . Not on file    Past Surgical History:  Procedure Laterality Date  . ABDOMINAL HYSTERECTOMY     partial  . CHOLECYSTECTOMY      Family History  Problem Relation Age of Onset  .  Diabetes Mother   . Diabetes Father     Allergies  Allergen Reactions  . Insulin Aspart Other (See Comments) and Swelling    adverse reaction to Novolog   . Latex Itching and Rash  . Morphine Itching and Rash    Current Outpatient Medications on File Prior to Visit  Medication Sig Dispense Refill  . Albuterol Sulfate (PROAIR RESPICLICK) 123XX123 (90 Base) MCG/ACT AEPB Inhale 2 puffs into the lungs at bedtime as needed for shortness of breath.    Marland Kitchen aspirin 81 MG tablet Take 81 mg by mouth daily.    . carvedilol (COREG) 25 MG tablet TAKE 1 TABLET BY MOUTH TWICE DAILY WITH MEALS 60 tablet 0  . cephALEXin (KEFLEX) 500 MG capsule Take 1 capsule (500 mg total) by mouth 2 (two) times daily. 20 capsule 0  . Cholecalciferol (VITAMIN D3) 400 units CAPS Take 400 mg by mouth daily.    . cimetidine (TAGAMET) 400 MG tablet Take 1 tablet (400 mg total) by mouth 2 (two) times daily. 60 tablet 3  . doxycycline (VIBRA-TABS) 100 MG tablet Take 1 tablet (100 mg total) by mouth 2 (two) times daily. Can give caps or generic. 6 tablet 0  . famciclovir (FAMVIR) 500 MG tablet Take 1 tablet (500 mg total) by mouth 3 (three) times daily. 21 tablet 0  . FARXIGA 10 MG TABS tablet Take 1 tablet by mouth once daily 90 tablet 0  . fluticasone furoate-vilanterol (BREO ELLIPTA) 100-25 MCG/INH AEPB Inhale 1 puff into the lungs daily.    Marland Kitchen gabapentin (NEURONTIN) 300 MG capsule Take 300 mg by mouth 4 (four) times daily as needed for pain.    Marland Kitchen insulin degludec (TRESIBA FLEXTOUCH) 100 UNIT/ML SOPN FlexTouch Pen Inject 40 Units into the skin daily at 10 pm.    . meloxicam (MOBIC) 7.5 MG tablet Take 1 tablet by mouth once daily 30 tablet 0  . metoCLOPramide (REGLAN) 10 MG tablet Take 1 tablet (10 mg total) by mouth every 8 (eight) hours as needed for nausea. 20 tablet 0  . oxyCODONE (OXY IR/ROXICODONE) 5 MG immediate release tablet 1 tab po twice daily as needed for severe pain 60 tablet 0  . polyethylene glycol (MIRALAX /  GLYCOLAX) packet Take 17 g by mouth daily. 14 each 0  . promethazine (PHENERGAN) 12.5 MG tablet TAKE 1 TABLET BY MOUTH EVERY 8 HOURS AS NEEDED FOR  NAUSEA  OR  VOMITING 20 tablet 0  . ranitidine (ZANTAC) 150 MG capsule Take 1 capsule (150 mg total) by mouth 2 (two) times daily. 60 capsule 3  . senna-docusate (SENOKOT-S) 8.6-50 MG  tablet Take 2 tablets by mouth 2 (two) times daily. 60 tablet 1  . spironolactone (ALDACTONE) 25 MG tablet Take 1 tablet by mouth once daily 30 tablet 0  . sucralfate (CARAFATE) 1 g tablet Take 1 tablet (1 g total) by mouth 4 (four) times daily -  with meals and at bedtime. 120 tablet 0  . tiZANidine (ZANAFLEX) 4 MG capsule 1 tab po q hs muscle spasms/cramps 20 capsule 2  . tiZANidine (ZANAFLEX) 4 MG tablet TAKE 1 TABLET BY MOUTH AT BEDTIME 10 tablet 0  . fluticasone (FLONASE) 50 MCG/ACT nasal spray Place 2 sprays into the nose daily. 16 g 0   No current facility-administered medications on file prior to visit.     BP (!) 146/103   Pulse 81   Temp (!) 96.8 F (36 C) (Temporal)   Resp 16   Ht 6' (1.829 m)   Wt 189 lb 9.6 oz (86 kg)   SpO2 100%   BMI 25.71 kg/m       Objective:   Physical Exam  General Appearance- Not in acute distress.    Chest and Lung Exam Auscultation: Breath sounds:-Normal. Clear even and unlabored. Adventitious sounds:- No Adventitious sounds.  Cardiovascular Auscultation:Rythm - Regular, rate and rythm. Heart Sounds -Normal heart sounds.  Abdomen Inspection:-Inspection Normal.  Palpation/Perucssion: Palpation and Percussion of the abdomen reveal- mild supraubic tendernss , No Rebound tenderness, No rigidity(Guarding) and No Palpable abdominal masses.  Liver:-Normal.  Spleen:- Normal.   Back Mid lumbar spine tenderness to palpation. Pain on straight leg lift. Pain on lateral movements and flexion/extension of the spine. Faint left cva tenderness.  Lower ext neurologic  L5-S1 sensation intact bilaterally. Normal  patellar reflexes bilaterally. No foot drop bilaterally.      Assessment & Plan:  Vaginal discharge as you described if suspicious for BV.  I am prescribing Flagyl.  You also reports some urinary tract infection type symptoms.  If the symptoms worsen then go ahead and start Keflex antibiotic.  This antibiotic can also help ear infection though presently your tympanic membrane does look normal.  You do report some nasal congestion and probable mild allergic rhinitis type symptoms.  Flonase sent to your pharmacy.  This can treat allergies and also treat potential eustachian tube dysfunction.  I did go ahead and refill your oxycodone for your chronic back pain history.  Continue to use sparingly.  I do not have copy of your FMLA paperwork that was filled out last year.  If you would get copy from your work and bring it back here along with blank form then will fill it out the same.   Your blood pressure is little elevated today but you are not on your blood pressure medication.  Please take your medication daily.  Urine culture and urine ancillary sent out.  Follow-up in 2 weeks or as needed.  25 + minutes spent with patient today.  50% of time spent counseling patient on plan going forward.

## 2018-11-10 NOTE — Patient Instructions (Addendum)
Vaginal discharge as you described if suspicious for BV.  I am prescribing Flagyl.  You also reports some urinary tract infection type symptoms.  If the symptoms worsen then go ahead and start Keflex antibiotic.  This antibiotic can also help ear infection though presently your tympanic membrane does look normal.  You do report some nasal congestion and probable mild allergic rhinitis type symptoms.  Flonase sent to your pharmacy.  This can treat allergies and also treat potential eustachian tube dysfunction.  I did go ahead and refill your oxycodone  for your chronic back pain history.  Continue to use sparingly.  I do not have copy of your FMLA paperwork that was filled out last year.  If you would get copy from your work and bring it back here along with blank form then will fill it out the same.   Your blood pressure is little elevated today but you are not on your blood pressure medication.  Please take your medication daily.  Urine culture and urine ancillary sent out.  Follow-up in 2 weeks or as needed.

## 2018-11-11 LAB — URINE CYTOLOGY ANCILLARY ONLY
Chlamydia: NEGATIVE
Molecular Disclaimer: NEGATIVE
Molecular Disclaimer: NORMAL
Neisseria Gonorrhea: NEGATIVE

## 2018-11-12 ENCOUNTER — Telehealth: Payer: Self-pay | Admitting: Medical

## 2018-11-12 LAB — URINE CULTURE
MICRO NUMBER:: 939136
SPECIMEN QUALITY:: ADEQUATE

## 2018-11-12 LAB — URINE CYTOLOGY ANCILLARY ONLY
Bacterial vaginitis: NEGATIVE
Candida vaginitis: NEGATIVE

## 2018-11-12 MED ORDER — CIPROFLOXACIN HCL 500 MG PO TABS: 500.0000 mg | ORAL_TABLET | Freq: Two times a day (BID) | ORAL | 0 refills | Status: DC

## 2018-11-12 NOTE — Telephone Encounter (Signed)
FMLA forms place in Provider folder.

## 2018-11-12 NOTE — Telephone Encounter (Signed)
Patient requesting call back from [facility] regarding paperwork.

## 2018-11-12 NOTE — Telephone Encounter (Signed)
Spoke with pt need these days added to [facility] 09/23/18,10/06/18,10/07/18,8/28/2,11/02/18,11/10/18,11/11/18

## 2018-11-12 NOTE — Telephone Encounter (Signed)
Sent in rx cipro. If needed for uti.

## 2018-11-16 DIAGNOSIS — Z0279 Encounter for issue of other medical certificate: Secondary | ICD-10-CM

## 2018-11-16 NOTE — Telephone Encounter (Signed)
I filled out form. Need charge sheet. I put on frequency 2 times per month. 3 days per episode. Overall I put 6 days per month since she missed a lot of days recently. See if she agrees with that. Then fax over form.

## 2018-11-17 NOTE — Telephone Encounter (Signed)
Notified pt. Faxed forms today.

## 2018-11-20 ENCOUNTER — Other Ambulatory Visit: Payer: Self-pay | Admitting: Medical

## 2018-11-28 ENCOUNTER — Other Ambulatory Visit: Payer: Self-pay | Admitting: Medical

## 2018-11-29 ENCOUNTER — Other Ambulatory Visit: Payer: Self-pay | Admitting: *Deleted

## 2018-11-29 MED ORDER — CARVEDILOL 25 MG PO TABS: 25.0000 mg | ORAL_TABLET | Freq: Two times a day (BID) | ORAL | 0 refills | Status: DC

## 2018-11-29 NOTE — Telephone Encounter (Signed)
Medication resent to pharmacy  

## 2018-11-29 NOTE — Telephone Encounter (Signed)
Pt's Rx for carvedilol (COREG) 25 MG tablet transcription failed, pharmacy never received. Please resend to pharmacy as soon as possible.

## 2018-12-01 ENCOUNTER — Other Ambulatory Visit: Payer: Self-pay

## 2018-12-01 ENCOUNTER — Telehealth: Payer: Self-pay | Admitting: Medical

## 2018-12-01 ENCOUNTER — Telehealth (INDEPENDENT_AMBULATORY_CARE_PROVIDER_SITE_OTHER): Payer: 59 | Admitting: Medical

## 2018-12-01 ENCOUNTER — Telehealth: Payer: Self-pay

## 2018-12-01 DIAGNOSIS — M545 Low back pain: Secondary | ICD-10-CM

## 2018-12-01 NOTE — Telephone Encounter (Signed)
Need some management help/advise on this pt. Will you stop by my area sometime before end of the week. Thanks,

## 2018-12-01 NOTE — Telephone Encounter (Signed)
Pt scheduled today at 4:40

## 2018-12-01 NOTE — Progress Notes (Addendum)
30 minutes on hold in addition to virtual visit today 12/01/2018. After waiting had conversation with representative.(Pt requested I call and gave me info. She was demanding I solve problem. So I called that evening in attempt to help pt)  Per representative pt  told [employer]  that she told them was having a hard time making appointment. And she states she was faxing over paperwork in summer and we did not get paperwork back pain to them. I reviewed chart in detail and did not see documentation that supports this.   I will ask management to look into any correspondence about paperwork fmla request she state sent over in summer or if pt engagement center has any records of pt calls?  Not I can't find any request until September.  [place] representative told me that fmla renewal paperwork was due in July and pt was made aware of this.   I attempted to justify pt days missed explaining to representative that the days she missed makes medical sense based on her back pain hx. Also that pt states she  had not came in due to fear of covid(early during pandemic she only did virtual visits but never mentioned fmla renewal paperwork?)  Regarding pt not wanting to come in office I explained to representative that this has happened during the pandemic. That persons have had serious medical conditions ignored due to fear of coming in office. When pt notified us of fmla paperwork in September I did make request for office visit.  Gave my cell so I can talk with claim manager if they have further questions.  Present dilema is pt states she wants me to backdate paperwork to July. Filled out paperwork in early October. [employer] wants a valid exucse as to why the paperwork was late when due in July. I explained to representative that the days she missed are still justified/tried to argue point for pt. But really can't account for her missing timeline.  12/06/2018.  Pt had visit for other reason today. End of  conversation she informed me was called on Saturday and told that they approved the dates retroactively. I was relieved to hear they made this decision. Counseled to be caution on deadlines in future going forward. Pt agrees and states will do paper work early next year. Mackie Pai, PA-C

## 2018-12-01 NOTE — Telephone Encounter (Signed)
Need to review forms filled out. But she needs appointment potentially to review her pain hx. Not sure about back dating if don't have records to support. If I do then need to review timeline of pain and document if I  got dates wrong.

## 2018-12-01 NOTE — Progress Notes (Signed)
Virtual Visit via Telephone Note  I connected with [name] on 12/01/18 at  4:40 PM EDT by telephone and verified that I am speaking with the correct person using two identifiers.  Location: Patient: home Provider: office   No vital signs taken today.  I discussed the limitations, risks, security and privacy concerns of performing an evaluation and management service by telephone and the availability of in person appointments. I also discussed with the patient that there may be a patient responsible charge related to this service. The patient expressed understanding and agreed to proceed.   History of Present Illness:   Pt states so fmla paperwork issues. She states there was issue with dates.   Pt had missed days from work due to back pain. They are giving her points on days missed  09-23-2018, 10-06-2018, 10-07-2018, 11-02-2018, 11-10-2018, and 11-11-2018.  Pt had requested for me to fill out paperwork for fmla. The initial request appears to have been on sept 23, 2020. MA informed her I wanted pt to schedule appointment. There was delay in her getting in office until 11-10-2018. I did not have copies of any old fmla paperwork to reference. Though patient states I did fill out in past.  I had asked her to get old paperwork from her HR. To send [place] copy but that was never done.  I went ahead and filled out new fmla form on 11-16-2018. Based on prior notes, past interview and gave adequate date that would cover future abcenses.  Pt informs me today that paperwork was due in summer. She expressed she never came in to office to get done due to fear/concerns about pandemic.   So on chart review and discussion she presented paperwork for me to fill out past due date. Now she needs confirmation/explanation on delay.   Observations/Objective: General no acute distress,pleasant, oriented. Normal speech.  Assessment and Plan: [id number]. Claim number [id number]  Patient does have  intermittent episodes of severe back pain which warrant her being off of work.  I did fill out recent FMLA form and away intended to provide adequate days off in the future.  Also the frequency and length of episodes should have covered August and September as well as October dates missed.  Patient had some delay in getting the paperwork filled out.  She expressed that she did not come in earlier due to Covid pandemic concerns.  She wants me now to call and talk to person handling her claim.  I will do my best to explain/justify those days missed.  I will explain about patient's reluctance to come in for evaluation due to her concerns during the pandemic.  Note this is reasoning patient to give me.  I will attempt to call and leave message now along with the claim reference number.  Follow-up date to be determined.  Follow Up Instructions:    I discussed the assessment and treatment plan with the patient. The patient was provided an opportunity to ask questions and all were answered. The patient agreed with the plan and demonstrated an understanding of the instructions.   The patient was advised to call back or seek an in-person evaluation if the symptoms worsen or if the condition fails to improve as anticipated.  I spoke with the patient's for about 37minutes today.  50% of time spent counseling on our plan going forward regarding attempts for her not to be penalized for the days missed.  Quite extensive conversation/lengthy since I needed to get details  of why her company was giving her points for days missed.  Also needed to explain to patients how I would attempt to help her.   Mackie Pai, PA-C

## 2018-12-01 NOTE — Patient Instructions (Signed)
Patient does have intermittent episodes of severe back pain which warrant her being off of work.  I did fill out recent FMLA form and away intended to provide adequate days off in the future.  Also the frequency and length of episodes should have covered August and September as well as October dates missed.  Patient had some delay in getting the paperwork filled out.  She expressed that she did not come in earlier due to Covid pandemic concerns.  She wants me now to call and talk to person handling her claim.  I will do my best to explain/justify those days missed.  I will explain about patient's reluctance to come in for evaluation due to her concerns during the pandemic.  Note this is reasoning patient to give me.  I will attempt to call and leave message now along with the claim reference number.  Follow-up date to be determined.

## 2018-12-01 NOTE — Telephone Encounter (Signed)
Pt called stating she needs disability paperwork back dated to July and they gave her a number for the Doctor line to call and confirm. [id number] claim number [id number]. I asked pt did the can paperwork that needed to be filled out pt stated no she was not told she needed any. Does pt need an appointment? Please advise.

## 2018-12-03 ENCOUNTER — Telehealth: Payer: Self-pay | Admitting: Medical

## 2018-12-03 NOTE — Telephone Encounter (Signed)
Pt scheduled for appointment on Monday with me. Is there a way you can look through the [facility] correspondence/see if pt tried to make appointment to fill out fmla paperwork renewal before july as the adjuster stated she told them.   Also I talked with Dr. Etter Sjogren about situation and she is in agreement with us/impossible to back date.   Would you mind reviewing my last note and maybe calling pt today or on Monday.   Touch base with her. That might make Monday appointment go smoother and decrease time for me.   Dr. Etter Sjogren did mention may need to dismiss her.   I am not wanting to do this presently but will see how she responds to your call and see how it goes on Monday.

## 2018-12-03 NOTE — Telephone Encounter (Signed)
If she did not express anything on scheduling appointment then would wait. I'll discuss it with her on monday. Conversation about fmla may worsens her anxiety over the weekend.   Thanks for checking

## 2018-12-03 NOTE — Telephone Encounter (Signed)
I pulled it and she just asked for a VV to address her anxiety and then they transferred her to [facility] to schedule.   What would you like me to contact her about? The inability to backdate ppw? I just want to be clear prior to calling since she is already dealing with anxiety.

## 2018-12-06 ENCOUNTER — Ambulatory Visit (INDEPENDENT_AMBULATORY_CARE_PROVIDER_SITE_OTHER): Payer: 59 | Admitting: Medical

## 2018-12-06 ENCOUNTER — Other Ambulatory Visit: Payer: Self-pay

## 2018-12-06 DIAGNOSIS — E119 Type 2 diabetes mellitus without complications: Secondary | ICD-10-CM

## 2018-12-06 DIAGNOSIS — R0789 Other chest pain: Secondary | ICD-10-CM | POA: Diagnosis not present

## 2018-12-06 DIAGNOSIS — I2510 Atherosclerotic heart disease of native coronary artery without angina pectoris: Secondary | ICD-10-CM

## 2018-12-06 DIAGNOSIS — E785 Hyperlipidemia, unspecified: Secondary | ICD-10-CM | POA: Diagnosis not present

## 2018-12-06 DIAGNOSIS — F419 Anxiety disorder, unspecified: Secondary | ICD-10-CM | POA: Diagnosis not present

## 2018-12-06 MED ORDER — BUSPIRONE HCL 15 MG PO TABS: 15.0000 mg | ORAL_TABLET | Freq: Two times a day (BID) | ORAL | 0 refills | Status: DC

## 2018-12-06 NOTE — Patient Instructions (Signed)
For recent anxiety, I will rx buspar for you to take daily. This would be preferred option rather than benzo since you are on narcotic for your back pain.   Will see if buspar at current dose and frequency adequate.  With hx of chest pain with anxiety, CAD and risk factors will refer to cardiologist. But explained in detail if any recurrent chest pain before then have to recommend ED evaluation. Pt expressed understanding.  For high cholesterol placed future cmp and lipid panel.  For diabetes follow up with endocrinologist for a1c.  Follow up 2 weeks to see how your anxiety is doing.

## 2018-12-06 NOTE — Progress Notes (Signed)
Subjective:    Patient ID: [name], female    DOB: 1965/03/08, 53 y.o.   MRN: [id number]  HPI  Virtual Visit via Telephone Note  I connected with [name] on 12/06/18 at 11:00 AM EDT by telephone and verified that I am speaking with the correct person using two identifiers.  Location: Patient: home Provider: office   I discussed the limitations, risks, security and privacy concerns of performing an evaluation and management service by telephone and the availability of in person appointments. I also discussed with the patient that there may be a patient responsible charge related to this service. The patient expressed understanding and agreed to proceed.   History of Present Illness: Pt calling for some recent anxiety.   She states this anxiety is sometimes constant. She states miles is racing thousand miles a minute. Feels like having panic attacks randomly(describes daily anxiety to some degree). She states in 2011 she had panic attack with chest pain. She went to ED and had  work up. Pt states her anxiety comes on very quickly. Pt has some hx of anxiety related of moms poor health. Pt anxiety has been on and off since 2011 but recently getting severe. Pt had stress back then. She describes cardiac cath. 30% blockage of vessel but did not have MI>  Her anxiety is causing insomnia.  About a month ago she was having chest pain following anxiety.  But recently that was not the case. Last time had any chest pain was 3 weeks. Pt is on farxigo, lipitor and aspirin daily.    The 10-year ASCVD risk score Mikey Bussing DC Brooke Bonito., et al., 2013) is: 16.1%   Values used to calculate the score:     Age: 50 years     Sex: Female     Is Non-Hispanic African American: Yes     Diabetic: Yes     Tobacco smoker: No     Systolic Blood Pressure: 123456 mmHg     Is BP treated: Yes     HDL Cholesterol: 44 mg/dL     Total Cholesterol: 155 mg/dL        Observations/Objection.(briefly  video did work but audio did not work)  TEFL teacher acute distress, pleasant, oriented. Lungs- on inspection lungs appear unlabored. Neck- no tracheal deviation or jvd on inspection. Neuro- gross motor function appears intact.  Assessment and Plan: For recent anxiety, I will rx buspar for you to take daily. This would be preferred option rather than benzo since you are on narcotic for your back pain.   Will see if buspar at current dose and frequency adequate.  With hx of chest pain with anxiety, CAD and risk factors will refer to cardiologist. But explained in detail if any recurrent chest pain before then have to recommend ED evaluation. Pt expressed understanding.  For high cholesterol placed future cmp and lipid panel.  For diabetes follow up with endocrinologist for a1c.  Follow up 2 weeks to see how your anxiety is doing.  Mackie Pai, PA-C  Follow Up Instructions:    I discussed the assessment and treatment plan with the patient. The patient was provided an opportunity to ask questions and all were answered. The patient agreed with the plan and demonstrated an understanding of the instructions.   The patient was advised to call back or seek an in-person evaluation if the symptoms worsen or if the condition fails to improve as anticipated.  I provided 25 minutes of non-face-to-face time during  this encounter.   Mackie Pai, PA-C   Review of Systems  Constitutional: Negative for chills, fatigue and fever.  Respiratory: Negative for cough, chest tightness, shortness of breath and wheezing.   Cardiovascular: Negative for chest pain and palpitations.  Gastrointestinal: Negative for abdominal pain.  Musculoskeletal: Negative for back pain and gait problem.  Skin: Negative for rash.  Hematological: Negative for adenopathy. Does not bruise/bleed easily.  Psychiatric/Behavioral: Positive for sleep disturbance. Negative for agitation, confusion, dysphoric mood and  suicidal ideas. The patient is nervous/anxious.        Objective:   Physical Exam        Assessment & Plan:

## 2018-12-08 ENCOUNTER — Ambulatory Visit (INDEPENDENT_AMBULATORY_CARE_PROVIDER_SITE_OTHER): Payer: 59 | Admitting: Medical

## 2018-12-08 ENCOUNTER — Other Ambulatory Visit: Payer: Self-pay | Admitting: Medical

## 2018-12-08 ENCOUNTER — Other Ambulatory Visit: Payer: Self-pay

## 2018-12-08 DIAGNOSIS — R5383 Other fatigue: Secondary | ICD-10-CM | POA: Diagnosis not present

## 2018-12-08 DIAGNOSIS — R197 Diarrhea, unspecified: Secondary | ICD-10-CM | POA: Diagnosis not present

## 2018-12-08 DIAGNOSIS — H9202 Otalgia, left ear: Secondary | ICD-10-CM

## 2018-12-08 DIAGNOSIS — H6690 Otitis media, unspecified, unspecified ear: Secondary | ICD-10-CM

## 2018-12-08 DIAGNOSIS — R519 Headache, unspecified: Secondary | ICD-10-CM | POA: Diagnosis not present

## 2018-12-08 DIAGNOSIS — R112 Nausea with vomiting, unspecified: Secondary | ICD-10-CM

## 2018-12-08 MED ORDER — FLUTICASONE PROPIONATE 50 MCG/ACT NA SUSP: 2.0000 | Freq: Every day | NASAL | 1 refills | Status: DC

## 2018-12-08 MED ORDER — PROMETHAZINE HCL 12.5 MG PO TABS: ORAL_TABLET | ORAL | 0 refills | Status: DC

## 2018-12-08 MED ORDER — AZITHROMYCIN 250 MG PO TABS: ORAL_TABLET | ORAL | 0 refills | Status: DC

## 2018-12-08 NOTE — Progress Notes (Signed)
   Subjective:    Patient ID: [name], female    DOB: May 23, 1965, 53 y.o.   MRN: [id number]  HPI  Virtual Visit via Telephone Note  I connected with [name] on 12/08/18 at  2:00 PM EDT by telephone and verified that I am speaking with the correct person using two identifiers.  Location: Patient: home Provider:office   I discussed the limitations, risks, security and privacy concerns of performing an evaluation and management service by telephone and the availability of in person appointments. I also discussed with the patient that there may be a patient responsible charge related to this service. The patient expressed understanding and agreed to proceed.  Pt does not have blood pressure cuff to check her vitals.  History of Present Illness:   Pt states she has some recent left ear pain, ha, and upset stomach, loose stools last night and vomited one time. States symptoms started on Monday with mild symptoms but yesterday had increase of symptoms.  Pt has some pain in ear lobe area and around below her ear lobe.  Skin of ear is not red. If she presses on area below and around ear does hurt.  She describes fatigue.   Pt just recently started buspar for anxiety. New medication.       Observations/Objective: General- no acute distress, oriented, pleasant,  normal speech.   Assessment and Plan: Hydrate well with propel and  eat  bland foods. Phenergan for n and v. Immodium for loose stools.  For ear pain/OM  rx azithromycin.  Quarantine/stay at home until covid test results back. Do test tomorrow am at drive thru center.  If test negative and loose stool persist then may order gastro panel.  Follow up 7 days or as needed  Mackie Pai, PA-C  Follow Up Instructions:    I discussed the assessment and treatment plan with the patient. The patient was provided an opportunity to ask questions and all were answered. The patient agreed with the plan and  demonstrated an understanding of the instructions.   The patient was advised to call back or seek an in-person evaluation if the symptoms worsen or if the condition fails to improve as anticipated.  I provided 25 minutes of non-face-to-face time during this encounter.   Mackie Pai, PA-C   Review of Systems  Constitutional: Positive for fatigue. Negative for chills and fever.  HENT: Negative for congestion.   Respiratory: Negative for cough, chest tightness, shortness of breath and wheezing.   Cardiovascular: Negative for chest pain and palpitations.  Neurological: Positive for headaches. Negative for dizziness, speech difficulty, weakness and light-headedness.       Objective:   Physical Exam        Assessment & Plan:

## 2018-12-08 NOTE — Patient Instructions (Addendum)
Hydrate well with propel and  eat  bland foods. Phenergan for n and v. Immodium for loose stools.  For ear pain/OM  rx azithromycin.  Quarantine/stay at home until covid test results back. Do test tomorrow am at drive thru center.  If test negative and loose stool persist then may order gastro panel.  Follow up 7 days or as needed

## 2018-12-10 ENCOUNTER — Other Ambulatory Visit: Payer: Self-pay

## 2018-12-10 DIAGNOSIS — Z20822 Contact with and (suspected) exposure to covid-19: Secondary | ICD-10-CM

## 2018-12-11 LAB — NOVEL CORONAVIRUS, NAA: SARS-CoV-2, NAA: NOT DETECTED

## 2018-12-15 ENCOUNTER — Other Ambulatory Visit: Payer: Self-pay

## 2018-12-15 ENCOUNTER — Ambulatory Visit (INDEPENDENT_AMBULATORY_CARE_PROVIDER_SITE_OTHER): Payer: 59 | Admitting: Medical

## 2018-12-15 DIAGNOSIS — L089 Local infection of the skin and subcutaneous tissue, unspecified: Secondary | ICD-10-CM | POA: Diagnosis not present

## 2018-12-15 DIAGNOSIS — T7840XA Allergy, unspecified, initial encounter: Secondary | ICD-10-CM

## 2018-12-15 NOTE — Progress Notes (Signed)
Subjective:    Patient ID: [name], female    DOB: [birth year], 53 y.o.   MRN: [id number]  HPI   Virtual Visit via Video Note  I connected with [name] on 12/15/18 at  1:00 PM EST by a video enabled telemedicine application and verified that I am speaking with the correct person using two identifiers.  Location: Patient: home Provider: office  Pt did not check her bp, pulse or temp today. I discussed the limitations of evaluation and management by telemedicine and the availability of in person appointments. The patient expressed understanding and agreed to proceed.  History of Present Illness:  Pt states her ha is very minimal now. Seemed to improve with zpack. Pt ear lobe area pain is improved about 80%. She states had pain inside of ear canal if she finger near canal entrance.  Pt has new rash on left arm. Rash does itch.  no tongue swelling or any shortness of breath.  3 small bumps distal forearm little tender. Yesterday swollen and red in forearm near bumps. No upper arm swelling. Pain in arm but more in forearm.  Pt covid test the other day came back negative.   Pt states upset stomach, diarrhea and vomiting has resolved.   Observations/Objective: General-no acute distress, pleasant, oriented. Lungs- on inspection lungs appear unlabored. Neck- no tracheal deviation or jvd on inspection. Neuro- gross motor function appears intact. rt arm- distal forearm 3 small red raised bumps. No swelling. Upper arm not well visualized.(pt had some difficulty operating camera/giving me good angles) heent- no sinus pressure. Left ear. Pain around ear when she palpates. Faint residual pan when she put ear at entry of canal on left side. Much less than before    Assessment and Plan: You describe majority of prior symptoms cleared but now report new left forearm rash with itching, tenderness and swelling yesterday. On exam no appreciated phlebitis by inspection. Do  think best to treat for possible allergic reaction and skin infection. Will rx 4 day taper dose of prednisone. Rx advisement given. Particularly eat low sugar diet and take you diabetic meds. Start antibiotic doxycycline. Rx advisement given as well. If you get any recurrent swelling of arm then would recommend getting [place] of upper ext. Either outpatient or through ED.  Work note sent to pt my chart.  Follow up Sunday by my chart. Asked pt to send me my chart update before returning to work on Monday.  Not concerned for covid presently. Keeping her out of work for current condition/new complaint.  Follow Up Instructions:    I discussed the assessment and treatment plan with the patient. The patient was provided an opportunity to ask questions and all were answered. The patient agreed with the plan and demonstrated an understanding of the instructions.   The patient was advised to call back or seek an in-person evaluation if the symptoms worsen or if the condition fails to improve as anticipated.  I provided 25  minutes of non-face-to-face time during this encounter.   Mackie Pai, PA-C  Review of Systems  Constitutional: Negative for chills, fatigue and fever.  Respiratory: Negative for cough, chest tightness, shortness of breath and wheezing.   Cardiovascular: Negative for chest pain and palpitations.  Gastrointestinal: Negative for abdominal pain.  Musculoskeletal:       Forearm pain.  Skin: Positive for rash.  Neurological: Negative for dizziness, syncope, weakness and headaches.  Hematological: Negative for adenopathy. Does not bruise/bleed easily.  Psychiatric/Behavioral: Negative  for behavioral problems and confusion.       Objective:   Physical Exam        Assessment & Plan:

## 2018-12-15 NOTE — Patient Instructions (Addendum)
You describe majority of prior symptoms cleared but now report new left forearm rash with itching, tenderness and swelling yesterday. On exam no appreciated phlebitis by inspection. Do think best to treat for possible allergic reaction and skin infection. Will rx 4 day taper dose of prednisone. Rx advisement given. Particularly eat low sugar diet and take you diabetic meds. Start antibiotic doxycycline. Rx advisement given as well. If you get any recurrent swelling of arm then would recommend getting [place] of upper ext. Either outpatient or through ED.  Work note sent to pt my chart.  Follow up Sunday by my chart. Asked pt to send me my chart update before returning to work on Monday.

## 2018-12-16 ENCOUNTER — Emergency Department (HOSPITAL_BASED_OUTPATIENT_CLINIC_OR_DEPARTMENT_OTHER)
Admission: EM | Admit: 2018-12-16 | Discharge: 2018-12-16 | Disposition: A | Discharge status: Home / Self Care | Payer: 59 | Attending: Emergency Medicine | Admitting: Emergency Medicine

## 2018-12-16 ENCOUNTER — Other Ambulatory Visit: Payer: Self-pay

## 2018-12-16 ENCOUNTER — Encounter (HOSPITAL_BASED_OUTPATIENT_CLINIC_OR_DEPARTMENT_OTHER): Payer: Self-pay

## 2018-12-16 DIAGNOSIS — Z91040 Latex allergy status: Secondary | ICD-10-CM | POA: Diagnosis not present

## 2018-12-16 DIAGNOSIS — M542 Cervicalgia: Secondary | ICD-10-CM

## 2018-12-16 DIAGNOSIS — E119 Type 2 diabetes mellitus without complications: Secondary | ICD-10-CM | POA: Diagnosis not present

## 2018-12-16 DIAGNOSIS — I2510 Atherosclerotic heart disease of native coronary artery without angina pectoris: Secondary | ICD-10-CM | POA: Diagnosis not present

## 2018-12-16 DIAGNOSIS — I10 Essential (primary) hypertension: Secondary | ICD-10-CM | POA: Diagnosis not present

## 2018-12-16 DIAGNOSIS — Z79899 Other long term (current) drug therapy: Secondary | ICD-10-CM | POA: Insufficient documentation

## 2018-12-16 DIAGNOSIS — Z8541 Personal history of malignant neoplasm of cervix uteri: Secondary | ICD-10-CM | POA: Insufficient documentation

## 2018-12-16 DIAGNOSIS — Z7982 Long term (current) use of aspirin: Secondary | ICD-10-CM | POA: Insufficient documentation

## 2018-12-16 MED ORDER — ONDANSETRON 4 MG PO TBDP
4.0000 mg | ORAL_TABLET | Freq: Once | ORAL | Status: AC
  Administered 2018-12-16: 4 mg via ORAL

## 2018-12-16 NOTE — ED Triage Notes (Addendum)
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

## 2018-12-16 NOTE — ED Provider Notes (Signed)
[facility] Provider Note   CSN: [id number] Arrival date & time: 12/16/18  1525     History   Chief Complaint Chief Complaint  Patient presents with  . Neck Pain    HPI [name] is a 53 y.o. female presenting to the ED with complaint of 2 days of left lateral neck pain. Pain is worse with movement and palpation, as well as laying on her left side. She reports she has intermittent shooting pains towards her head. No new midline neck or back pain. She is prescribed oxycodone chronically for chronic low back pain, though took naproxen today instead to help her symptoms. 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 No obvious injury. She states she began feeling some nausea today. No weakness or numbness of the upper extremities.      The history is provided by the patient.    Past Medical History:  Diagnosis Date  . Cervical cancer ([place])    had surgery in 2001.  . Coronary artery disease    30% lesions noted  . Depression   . Diabetes mellitus   . GERD (gastroesophageal reflux disease)   . Hyperlipidemia   . Hypertension   . Neuropathy     Patient Active Problem List   Diagnosis Date Noted  . Neuropathy 07/20/2017  . Right flank pain   . Nausea & vomiting 07/07/2017  . Acute cystitis 07/07/2017  . CAD (coronary artery disease) 07/07/2017  . Metatarsalgia of both feet 03/11/2017  . Diabetic gastroparesis associated with type 1 diabetes mellitus ([place]) 11/27/2016  . Pure hypercholesterolemia 11/27/2016  . Lumbar facet arthropathy 05/14/2016  . Chronic pain syndrome 01/23/2016  . Diabetic peripheral neuropathy ([place]) 01/23/2016  . Anxiety 01/22/2016  . History of cervical cancer 08/17/2015  . Depression 08/10/2015  . Overweight (BMI 25.0-29.9) 07/21/2014  . Chronic kidney disease, stage III (moderate) 05/23/2014  . Diabetes type 2, uncontrolled ([place]) 05/23/2014  . Current use of insulin ([place]) 05/23/2014  .  Vitamin D deficiency 04/25/2013  . Cerebrovascular disease 09/13/2012  . Migraine 09/13/2012  . Nonspecific abnormal findings on radiological and examination of skull and head 09/13/2012  . Retention of urine 06/25/2011  . ANKLE PAIN, LEFT 12/25/2009  . DYSURIA 12/25/2009  . VAGINITIS, CANDIDAL 12/25/2009  . ULCER-GASTRIC 09/28/2009  . DIABETIC PERIPHERAL NEUROPATHY 09/21/2009  . TOBACCO ABUSE 09/21/2009  . URINARY TRACT INFECTION 09/21/2009  . INSOMNIA 09/21/2009  . TRANSAMINASES, SERUM, ELEVATED 09/21/2009  . Type 2 diabetes mellitus with stage 3 chronic kidney disease ([place]) 09/14/2009  . NAUSEA AND VOMITING 09/14/2009  . ABDOMINAL PAIN-EPIGASTRIC 09/14/2009  . CARDIOMYOPATHY, SECONDARY 09/06/2009  . Essential hypertension 08/30/2009  . DIABETES MELLITUS, TYPE II, UNCONTROLLED 07/30/2009  . HLD (hyperlipidemia) 07/30/2009  . GERD 07/30/2009  . GASTROPARESIS 07/30/2009  . CHEST PAIN, ATYPICAL 07/30/2009  . POSITIVE PPD 07/30/2009    Past Surgical History:  Procedure Laterality Date  . ABDOMINAL HYSTERECTOMY     partial  . CHOLECYSTECTOMY       OB History   No obstetric history on file.      Home Medications    Prior to Admission medications   Medication Sig Start Date End Date Taking? Authorizing Provider  Albuterol Sulfate (PROAIR RESPICLICK) [id number] (90 Base) MCG/ACT AEPB Inhale 2 puffs into the lungs at bedtime as needed for shortness of breath. 06/11/16   [provider]  amLODipine (NORVASC) 5 MG tablet Take 1 tablet (5 mg total) by mouth daily. 11/10/18  Saguier, Percell Miller, PA-C  aspirin 81 MG tablet Take 81 mg by mouth daily.    [provider]  atorvastatin (LIPITOR) 80 MG tablet Take 1 tablet (80 mg total) by mouth daily. 11/10/18   Saguier, Percell Miller, PA-C  busPIRone (BUSPAR) 15 MG tablet Take 1 tablet (15 mg total) by mouth 2 (two) times daily. 12/06/18   Saguier, Percell Miller, PA-C  carvedilol (COREG) 25 MG tablet Take 1 tablet (25 mg total) by mouth 2  (two) times daily with a meal. 11/29/18   Saguier, Percell Miller, PA-C  cephALEXin (KEFLEX) 500 MG capsule Take 1 capsule (500 mg total) by mouth 2 (two) times daily. 09/25/17   Saguier, Percell Miller, PA-C  cephALEXin (KEFLEX) 500 MG capsule Take 1 capsule (500 mg total) by mouth 2 (two) times daily. 11/10/18   Saguier, Percell Miller, PA-C  Cholecalciferol (VITAMIN D3) 400 units CAPS Take 400 mg by mouth daily.    [provider]  cimetidine (TAGAMET) 400 MG tablet Take 1 tablet (400 mg total) by mouth 2 (two) times daily. 01/13/18   Saguier, Percell Miller, PA-C  ciprofloxacin (CIPRO) 500 MG tablet Take 1 tablet (500 mg total) by mouth 2 (two) times daily. 11/12/18   Saguier, Percell Miller, PA-C  doxycycline (VIBRA-TABS) 100 MG tablet Take 1 tablet (100 mg total) by mouth 2 (two) times daily. Can give caps or generic. 10/19/17   Saguier, Percell Miller, PA-C  famciclovir (FAMVIR) 500 MG tablet Take 1 tablet (500 mg total) by mouth 3 (three) times daily. 09/10/17   Saguier, Percell Miller, PA-C  FARXIGA 10 MG TABS tablet Take 1 tablet by mouth once daily 10/21/18   Saguier, Percell Miller, PA-C  fluticasone [facility]) 50 MCG/ACT nasal spray Place 2 sprays into the nose daily. 03/30/12 07/07/17  Carmin Muskrat, MD  fluticasone (FLONASE) 50 MCG/ACT nasal spray Place 2 sprays into both nostrils daily. 12/08/18   Saguier, Percell Miller, PA-C  fluticasone furoate-vilanterol (BREO ELLIPTA) 100-25 MCG/INH AEPB Inhale 1 puff into the lungs daily. 09/15/16   [provider]  gabapentin (NEURONTIN) 300 MG capsule Take 300 mg by mouth 4 (four) times daily as needed for pain. 12/19/15   [provider]  hydrochlorothiazide (MICROZIDE) 12.5 MG capsule 1 tab po daily for 3 days. 11/10/18   Saguier, Percell Miller, PA-C  insulin degludec (TRESIBA FLEXTOUCH) 100 UNIT/ML SOPN FlexTouch Pen Inject 40 Units into the skin daily at 10 pm.    [provider]  meloxicam (MOBIC) 7.5 MG tablet Take 1 tablet by mouth once daily 11/29/18   Saguier, Percell Miller, PA-C  metoCLOPramide  (REGLAN) 10 MG tablet Take 1 tablet (10 mg total) by mouth every 8 (eight) hours as needed for nausea. 07/11/17   Doreatha Lew, MD  metroNIDAZOLE (FLAGYL) 500 MG tablet Take 1 tablet (500 mg total) by mouth 3 (three) times daily. 11/10/18   Saguier, Percell Miller, PA-C  omeprazole (PRILOSEC) 20 MG capsule Take 1 capsule (20 mg total) by mouth daily. 11/10/18   Saguier, Percell Miller, PA-C  oxyCODONE (OXY IR/ROXICODONE) 5 MG immediate release tablet 1 tab po twice daily as needed for severe pain 11/10/18   Saguier, Percell Miller, PA-C  polyethylene glycol (MIRALAX / GLYCOLAX) packet Take 17 g by mouth daily. 07/12/17   Patrecia Pour, Christean Grief, MD  promethazine (PHENERGAN) 12.5 MG tablet TAKE 1 TABLET BY MOUTH EVERY 8 HOURS AS NEEDED FOR  NAUSEA  OR  VOMITING 12/08/18   Saguier, Percell Miller, PA-C  ranitidine (ZANTAC) 150 MG capsule Take 1 capsule (150 mg total) by mouth 2 (two) times daily. 10/15/17  Saguier, Percell Miller, PA-C  senna-docusate (SENOKOT-S) 8.6-50 MG tablet Take 2 tablets by mouth 2 (two) times daily. 07/11/17   Doreatha Lew, MD  spironolactone (ALDACTONE) 25 MG tablet Take 1 tablet by mouth once daily 11/29/18   Saguier, Percell Miller, PA-C  sucralfate (CARAFATE) 1 g tablet Take 1 tablet (1 g total) by mouth 4 (four) times daily -  with meals and at bedtime. 07/20/17   Saguier, Percell Miller, PA-C    Family History Family History  Problem Relation Age of Onset  . Diabetes Mother   . Diabetes Father     Social History Social History   Tobacco Use  . Smoking status: Never Smoker  . Smokeless tobacco: Never Used  Substance Use Topics  . Alcohol use: Yes    Comment: occ  . Drug use: No     Allergies   Insulin aspart, Latex, and Morphine   Review of Systems Review of Systems  All other systems reviewed and are negative.    Physical Exam Updated Vital Signs BP (!) 149/105 (BP Location: Left Arm)   Pulse 90   Temp 97.9 F (36.6 C) (Oral)   Resp 14   Ht 6' (1.829 m)   Wt 86.2 kg   SpO2 100%   BMI 25.77  kg/m   Physical Exam Vitals signs and nursing note reviewed.  Constitutional:      General: She is not in acute distress.    Appearance: She is well-developed.  HENT:     Head: Normocephalic and atraumatic.  Eyes:     Conjunctiva/sclera: Conjunctivae normal.  Neck:      Comments: TTP to left lateral neck musculature along the proximal lateral border of the trapezius muscle group as well as the posterior portion of SCM. Normal ROM of the neck , though some pain with turning head to the right. No swelling or skin changes. No midline spinal or paraspinal TTP. Cardiovascular:     Rate and Rhythm: Normal rate and regular rhythm.     Comments: Strong and equal radial pulses BUE Pulmonary:     Effort: Pulmonary effort is normal. No respiratory distress.     Breath sounds: Normal breath sounds.  Abdominal:     Palpations: Abdomen is soft.  Skin:    General: Skin is warm.  Neurological:     Mental Status: She is alert.     Comments: Normal tone.  5/5 strength in BUE and BLE including strong and equal grip strength and dorsiflexion/plantar flexion Sensory: Pinprick and light touch normal in BLE extremities.  Gait: normal gait and balance    Psychiatric:        Behavior: Behavior normal.      ED Treatments / Results  Labs (all labs ordered are listed, but only abnormal results are displayed) Labs Reviewed - No data to display  EKG None  Radiology No results found.  Procedures Procedures (including critical care time)  Medications Ordered in ED Medications  ondansetron (ZOFRAN-ODT) disintegrating tablet 4 mg (4 mg Oral Given 12/16/18 1635)     Initial Impression / Assessment and Plan / ED Course  I have reviewed the triage vital signs and the nursing notes.  Pertinent labs & imaging results that were available during my care of the patient were reviewed by me and considered in my medical decision making (see chart for details).        Pt presenting with 2 days  of left sided neck pain that is most consistent with musculoskeletal pain. Pain  is reproducible with palpation and movement. No skin changes or inflammation. Normal strength, sensation, and equal pulses of bilateral upper extremities.  Patient is chronically prescribed oxycodone for chronic low back pain though stopped taking it today to trial Naprosyn.  She began having some nausea associated with no other symptoms suspicious for cardiac etiology.  Suspect patient may be having mild withdrawal symptoms after abruptly discontinuing the oxycodone today.  Discussed this with patient.  Also discussed symptomatic management for likely muscle strain versus spasm.  Patient is aware she is able to take Naprosyn in addition to her oxycodone as needed for pain.  Symptomatic management discussed. She is instructed to follow-up with her PCP.  Strict return precautions discussed.  Patient agreeable plan and safe for discharge.  Final Clinical Impressions(s) / ED Diagnoses   Final diagnoses:  Neck pain    ED Discharge Orders    None       , [place] N, PA-C 12/16/18 1653    Maudie Flakes, MD 12/16/18 2321

## 2018-12-16 NOTE — Discharge Instructions (Addendum)
Please read instructions below. Apply heat to help with spasm.  Take your prescribed medications as directed for pain.  Schedule an appointment with your primary care provider to follow-up on your visit today for symptoms persist. Return to the ER for new or concerning symptoms.

## 2018-12-17 ENCOUNTER — Telehealth: Payer: Self-pay | Admitting: Medical

## 2018-12-17 ENCOUNTER — Other Ambulatory Visit: Payer: Self-pay | Admitting: Medical

## 2018-12-17 DIAGNOSIS — M25572 Pain in left ankle and joints of left foot: Secondary | ICD-10-CM

## 2018-12-17 MED ORDER — PREDNISONE 10 MG PO TABS: ORAL_TABLET | ORAL | 0 refills | Status: DC

## 2018-12-17 MED ORDER — DOXYCYCLINE HYCLATE 100 MG PO TABS: 100.0000 mg | ORAL_TABLET | Freq: Two times a day (BID) | ORAL | 0 refills | Status: DC

## 2018-12-17 NOTE — Telephone Encounter (Signed)
Resent meds to [facility] main.

## 2018-12-17 NOTE — Addendum Note (Signed)
Addended by: Anabel Halon on: 12/17/2018 11:37 AM   Modules accepted: Orders

## 2018-12-17 NOTE — Telephone Encounter (Signed)
Pt states rxs were not called in after her OV yesterday. Per OV note, Prednisone and Doxycycline should be called in to:   [facility] [phone] (Phone) [phone] (Fax)

## 2018-12-24 ENCOUNTER — Ambulatory Visit: Payer: 59 | Admitting: Cardiology

## 2018-12-28 ENCOUNTER — Other Ambulatory Visit: Payer: Self-pay

## 2018-12-28 ENCOUNTER — Other Ambulatory Visit: Payer: Self-pay | Admitting: Medical

## 2018-12-28 ENCOUNTER — Ambulatory Visit (INDEPENDENT_AMBULATORY_CARE_PROVIDER_SITE_OTHER): Payer: 59 | Admitting: Medical

## 2018-12-28 ENCOUNTER — Telehealth: Payer: Self-pay

## 2018-12-28 DIAGNOSIS — E119 Type 2 diabetes mellitus without complications: Secondary | ICD-10-CM | POA: Diagnosis not present

## 2018-12-28 DIAGNOSIS — I2510 Atherosclerotic heart disease of native coronary artery without angina pectoris: Secondary | ICD-10-CM

## 2018-12-28 DIAGNOSIS — M542 Cervicalgia: Secondary | ICD-10-CM | POA: Diagnosis not present

## 2018-12-28 DIAGNOSIS — E1043 Type 1 diabetes mellitus with diabetic autonomic (poly)neuropathy: Secondary | ICD-10-CM

## 2018-12-28 DIAGNOSIS — R109 Unspecified abdominal pain: Secondary | ICD-10-CM

## 2018-12-28 DIAGNOSIS — F419 Anxiety disorder, unspecified: Secondary | ICD-10-CM

## 2018-12-28 DIAGNOSIS — K3184 Gastroparesis: Secondary | ICD-10-CM

## 2018-12-28 MED ORDER — MELOXICAM 7.5 MG PO TABS: ORAL_TABLET | ORAL | 0 refills | Status: DC

## 2018-12-28 MED ORDER — TIZANIDINE HCL 6 MG PO CAPS: 6.0000 mg | ORAL_CAPSULE | Freq: Three times a day (TID) | ORAL | 0 refills | Status: DC | PRN

## 2018-12-28 NOTE — Progress Notes (Signed)
Subjective:    Patient ID: [name], female    DOB: Mar 13, 1965, 53 y.o.   MRN: [id number]  HPI  Virtual Visit via Telephone Note  I connected with [name] on 12/28/18 at  4:40 PM EST by telephone and verified that I am speaking with the correct person using two identifiers.  Location: Patient: home Provider: office   I discussed the limitations, risks, security and privacy concerns of performing an evaluation and management service by telephone and the availability of in person appointments. I also discussed with the patient that there may be a patient responsible charge related to this service. The patient expressed understanding and agreed to proceed.    I provided 25 minutes of non-face-to-face time during this encounter.   Mackie Pai, PA-C   History of Present Illness:   Pt with recent neck pain. This appears to be continuation of pain for which she was seen in the ED on 12/16/2018.  hpi from ED.  [name] is a 53 y.o. female presenting to the ED with complaint of 2 days of left lateral neck pain. Pain is worse with movement and palpation, as well as laying on her left side. She reports she has intermittent shooting pains towards her head. No new midline neck or back pain. She is prescribed oxycodone chronically for chronic low back pain, though took naproxen today instead to help her symptoms. 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 No obvious injury. She states she began feeling some nausea today. No weakness or numbness of the upper extremities.   She agrees pain distribution location. Still has not eased up.   Pt states she had been using zanaflex and naprosyn before she went to the ED. Pt has oxyccodone available to use and take twice a day.This is for low back pain.  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   Pt has anxiety and is on buspar. She has hx of anxiety. She states  this seems to upset her stomach. She has history of gastroparesis. Recently feels bloated.  Pt has history of intermittent chest pain. I referred to cardiologist. Pt got there late so she got scheduled 7th of December.  No associated type cardiac type signs or symptoms in ED but did have left side neck pain. But reports pain intermittently in the past.  No current chest pain.      Observations/Objective:  General- no acute distress. Pleasant. Oriented. Normal speech.    Assessment and Plan: For neck pain, I prescribed meloxicam 7.5 mg to take 1-2 tab po q day,zanaflex, and continue oxycodone.  For abdomen pain can increase omeprazole 20 mg to twice daily.   Hx of gastroparesis. Will see how responds to increase omeprazole. May need to see GI to get opinion if can use reglan.  For hx of cad and atypical intermittent chest pain, I explained to get scheduled for tomorrow afternoon. If you get any recurrent significant chest pain then be seen in ED. Plan to get ekg tomorrow and may get other labs in light of hx and delayed appointment to cardiologist since she showed up late for appointment.  Follow up tomorrow afternoon 2:20 pm.  Mackie Pai, PA-C  Follow Up Instructions:    I discussed the assessment and treatment plan with the patient. The patient was provided an opportunity to ask questions and all were answered. The patient agreed with the plan and demonstrated an understanding of  the instructions.   The patient was advised to call back or seek an in-person evaluation if the symptoms worsen or if the condition fails to improve as anticipated.  I provided 25 + minutes of non-face-to-face time during this encounter.   Mackie Pai, PA-C    Review of Systems  Constitutional: Negative for chills, fatigue and fever.  Respiratory: Negative for cough, chest tightness, shortness of breath and wheezing.   Cardiovascular: Negative for chest pain and palpitations.   Gastrointestinal: Positive for abdominal pain. Negative for blood in stool, diarrhea, nausea and vomiting.  Genitourinary: Negative for dysuria, frequency and urgency.  Musculoskeletal: Negative for back pain and myalgias.       Neck pain left side  Skin: Negative for rash.  Neurological: Negative for dizziness, syncope, weakness, light-headedness and headaches.  Hematological: Negative for adenopathy.  Psychiatric/Behavioral: Negative for behavioral problems, confusion, sleep disturbance and suicidal ideas. The patient is nervous/anxious.     Past Medical History:  Diagnosis Date  . Cervical cancer ([place])    had surgery in 2001.  . Coronary artery disease    30% lesions noted  . Depression   . Diabetes mellitus   . GERD (gastroesophageal reflux disease)   . Hyperlipidemia   . Hypertension   . Neuropathy      Social History   Socioeconomic History  . Marital status: Single    Spouse name: Not on file  . Number of children: Not on file  . Years of education: Not on file  . Highest education level: Not on file  Occupational History  . Not on file  Social Needs  . Financial resource strain: Not on file  . Food insecurity    Worry: Not on file    Inability: Not on file  . Transportation needs    Medical: Not on file    Non-medical: Not on file  Tobacco Use  . Smoking status: Never Smoker  . Smokeless tobacco: Never Used  Substance and Sexual Activity  . Alcohol use: Yes    Comment: occ  . Drug use: No  . Sexual activity: Not on file  Lifestyle  . Physical activity    Days per week: Not on file    Minutes per session: Not on file  . Stress: Not on file  Relationships  . Social Herbalist on phone: Not on file    Gets together: Not on file    Attends religious service: Not on file    Active member of club or organization: Not on file    Attends meetings of clubs or organizations: Not on file    Relationship status: Not on file  . Intimate partner  violence    Fear of current or ex partner: Not on file    Emotionally abused: Not on file    Physically abused: Not on file    Forced sexual activity: Not on file  Other Topics Concern  . Not on file  Social History Narrative  . Not on file    Past Surgical History:  Procedure Laterality Date  . ABDOMINAL HYSTERECTOMY     partial  . CHOLECYSTECTOMY      Family History  Problem Relation Age of Onset  . Diabetes Mother   . Diabetes Father     Allergies  Allergen Reactions  . Insulin Aspart Other (See Comments) and Swelling    adverse reaction to Novolog   . Latex Itching and Rash  . Morphine Itching and Rash  Current Outpatient Medications on File Prior to Visit  Medication Sig Dispense Refill  . Albuterol Sulfate (PROAIR RESPICLICK) [id number] (90 Base) MCG/ACT AEPB Inhale 2 puffs into the lungs at bedtime as needed for shortness of breath.    Marland Kitchen amLODipine (NORVASC) 5 MG tablet Take 1 tablet (5 mg total) by mouth daily. 90 tablet 0  . aspirin 81 MG tablet Take 81 mg by mouth daily.    Marland Kitchen atorvastatin (LIPITOR) 80 MG tablet Take 1 tablet (80 mg total) by mouth daily. 90 tablet 1  . busPIRone (BUSPAR) 15 MG tablet Take 1 tablet (15 mg total) by mouth 2 (two) times daily. 60 tablet 0  . carvedilol (COREG) 25 MG tablet Take 1 tablet (25 mg total) by mouth 2 (two) times daily with a meal. 60 tablet 0  . cephALEXin (KEFLEX) 500 MG capsule Take 1 capsule (500 mg total) by mouth 2 (two) times daily. 20 capsule 0  . cephALEXin (KEFLEX) 500 MG capsule Take 1 capsule (500 mg total) by mouth 2 (two) times daily. 20 capsule 0  . Cholecalciferol (VITAMIN D3) 400 units CAPS Take 400 mg by mouth daily.    . cimetidine (TAGAMET) 400 MG tablet Take 1 tablet (400 mg total) by mouth 2 (two) times daily. 60 tablet 3  . doxycycline (VIBRA-TABS) 100 MG tablet Take 1 tablet (100 mg total) by mouth 2 (two) times daily. Can give caps or generic. 6 tablet 0  . famciclovir (FAMVIR) 500 MG tablet Take 1  tablet (500 mg total) by mouth 3 (three) times daily. 21 tablet 0  . FARXIGA 10 MG TABS tablet Take 1 tablet by mouth once daily 90 tablet 0  . fluticasone (FLONASE) 50 MCG/ACT nasal spray Place 2 sprays into the nose daily. 16 g 0  . fluticasone (FLONASE) 50 MCG/ACT nasal spray Place 2 sprays into both nostrils daily. 16 g 1  . fluticasone furoate-vilanterol (BREO ELLIPTA) 100-25 MCG/INH AEPB Inhale 1 puff into the lungs daily.    Marland Kitchen gabapentin (NEURONTIN) 300 MG capsule Take 300 mg by mouth 4 (four) times daily as needed for pain.    . hydrochlorothiazide (MICROZIDE) 12.5 MG capsule TAKE 1 CAPSULE BY MOUTH ONCE DAILY FOR 3 DAYS 3 capsule 0  . insulin degludec (TRESIBA FLEXTOUCH) 100 UNIT/ML SOPN FlexTouch Pen Inject 40 Units into the skin daily at 10 pm.    . meloxicam (MOBIC) 7.5 MG tablet Take 1 tablet by mouth once daily 30 tablet 0  . metoCLOPramide (REGLAN) 10 MG tablet Take 1 tablet (10 mg total) by mouth every 8 (eight) hours as needed for nausea. 20 tablet 0  . metroNIDAZOLE (FLAGYL) 500 MG tablet Take 1 tablet (500 mg total) by mouth 3 (three) times daily. 21 tablet 0  . omeprazole (PRILOSEC) 20 MG capsule Take 1 capsule (20 mg total) by mouth daily. 30 capsule 3  . oxyCODONE (OXY IR/ROXICODONE) 5 MG immediate release tablet 1 tab po twice daily as needed for severe pain 45 tablet 0  . polyethylene glycol (MIRALAX / GLYCOLAX) packet Take 17 g by mouth daily. 14 each 0  . predniSONE (DELTASONE) 10 MG tablet 4 tab po day 1, 3 tab po day 2, 2 tab po day 3, 2 tab po day 4 10 tablet 0  . promethazine (PHENERGAN) 12.5 MG tablet TAKE 1 TABLET BY MOUTH EVERY 8 HOURS AS NEEDED FOR  NAUSEA  OR  VOMITING 20 tablet 0  . ranitidine (ZANTAC) 150 MG capsule Take 1 capsule (150 mg  total) by mouth 2 (two) times daily. 60 capsule 3  . senna-docusate (SENOKOT-S) 8.6-50 MG tablet Take 2 tablets by mouth 2 (two) times daily. 60 tablet 1  . spironolactone (ALDACTONE) 25 MG tablet Take 1 tablet by mouth once  daily 90 tablet 0  . sucralfate (CARAFATE) 1 g tablet Take 1 tablet (1 g total) by mouth 4 (four) times daily -  with meals and at bedtime. 120 tablet 0   No current facility-administered medications on file prior to visit.     There were no vitals taken for this visit.      Objective:   Physical Exam        Assessment & Plan:

## 2018-12-28 NOTE — Telephone Encounter (Signed)
Copied from [place] [id number]. Topic: Quick Communication - See Telephone Encounter >> Dec 28, 2018 10:13 AM Loma [place] wrote: CRM for notification. See Telephone encounter for: 12/28/18.pls fu with pt concerning tiZANidine (ZANAFLEX) 4 MG capsule she had asked for an increased dosage and did not get  wants explanation, [id number] FU

## 2018-12-28 NOTE — Patient Instructions (Signed)
For neck pain, I prescribed meloxicam 7.5 mg to take 1-2 tab po q day,zanaflex, and continue oxycodone.  For abdomen pain can increase omeprazole 20 mg to twice daily.   Hx of gastroparesis. Will see how responds to increase omeprazole. May need to see GI to get opinion if can use reglan.  For hx of cad and atypical intermittent chest pain, I explained to get scheduled for tomorrow afternoon. If you get any recurrent significant chest pain then be seen in ED. Plan to get ekg tomorrow and may get other labs in light of hx and delayed appointment to cardiologist since she showed up late for appointment.  Follow up tomorrow afternoon 2:20 pm.

## 2018-12-28 NOTE — Telephone Encounter (Signed)
Pt scheduled today

## 2018-12-29 ENCOUNTER — Other Ambulatory Visit: Payer: Self-pay

## 2018-12-29 ENCOUNTER — Encounter: Payer: Self-pay | Admitting: Medical

## 2018-12-29 ENCOUNTER — Telehealth: Payer: Self-pay | Admitting: Medical

## 2018-12-29 ENCOUNTER — Ambulatory Visit (INDEPENDENT_AMBULATORY_CARE_PROVIDER_SITE_OTHER): Payer: 59 | Admitting: Medical

## 2018-12-29 VITALS — BP 117/80 | HR 79 | Temp 96.7°F | Resp 16 | Ht 72.0 in | Wt 185.0 lb

## 2018-12-29 DIAGNOSIS — R0789 Other chest pain: Secondary | ICD-10-CM

## 2018-12-29 DIAGNOSIS — R109 Unspecified abdominal pain: Secondary | ICD-10-CM

## 2018-12-29 DIAGNOSIS — F419 Anxiety disorder, unspecified: Secondary | ICD-10-CM

## 2018-12-29 DIAGNOSIS — E1043 Type 1 diabetes mellitus with diabetic autonomic (poly)neuropathy: Secondary | ICD-10-CM

## 2018-12-29 DIAGNOSIS — K3184 Gastroparesis: Secondary | ICD-10-CM

## 2018-12-29 DIAGNOSIS — K861 Other chronic pancreatitis: Secondary | ICD-10-CM

## 2018-12-29 DIAGNOSIS — E119 Type 2 diabetes mellitus without complications: Secondary | ICD-10-CM | POA: Diagnosis not present

## 2018-12-29 LAB — CBC WITH DIFFERENTIAL/PLATELET
Basophils Absolute: 0.1 10*3/uL (ref 0.0–0.1)
Basophils Relative: 1.3 % (ref 0.0–3.0)
Eosinophils Absolute: 0.4 10*3/uL (ref 0.0–0.7)
Eosinophils Relative: 7.2 % — ABNORMAL HIGH (ref 0.0–5.0)
HCT: 39.1 % (ref 36.0–46.0)
Hemoglobin: 12.6 g/dL (ref 12.0–15.0)
Lymphocytes Relative: 48 % — ABNORMAL HIGH (ref 12.0–46.0)
Lymphs Abs: 2.6 10*3/uL (ref 0.7–4.0)
MCHC: 32.3 g/dL (ref 30.0–36.0)
MCV: 91 fl (ref 78.0–100.0)
Monocytes Absolute: 0.4 10*3/uL (ref 0.1–1.0)
Monocytes Relative: 7.2 % (ref 3.0–12.0)
Neutro Abs: 2 10*3/uL (ref 1.4–7.7)
Neutrophils Relative %: 36.3 % — ABNORMAL LOW (ref 43.0–77.0)
Platelets: 284 10*3/uL (ref 150.0–400.0)
RBC: 4.3 Mil/uL (ref 3.87–5.11)
RDW: 14.7 % (ref 11.5–15.5)
WBC: 5.5 10*3/uL (ref 4.0–10.5)

## 2018-12-29 LAB — TROPONIN I (HIGH SENSITIVITY): High Sens Troponin I: 3 ng/L (ref 2–17)

## 2018-12-29 LAB — COMPREHENSIVE METABOLIC PANEL
ALT: 23 U/L (ref 0–35)
AST: 25 U/L (ref 0–37)
Albumin: 4.2 g/dL (ref 3.5–5.2)
Alkaline Phosphatase: 84 U/L (ref 39–117)
BUN: 18 mg/dL (ref 6–23)
CO2: 24 mEq/L (ref 19–32)
Calcium: 9.2 mg/dL (ref 8.4–10.5)
Chloride: 107 mEq/L (ref 96–112)
Creatinine, Ser: 1.21 mg/dL — ABNORMAL HIGH (ref 0.40–1.20)
GFR: 56.1 mL/min — ABNORMAL LOW (ref >60.00)
Glucose, Bld: 127 mg/dL — ABNORMAL HIGH (ref 70–99)
Potassium: 4.5 mEq/L (ref 3.5–5.1)
Sodium: 139 mEq/L (ref 135–145)
Total Bilirubin: 0.4 mg/dL (ref 0.2–1.2)
Total Protein: 7.3 g/dL (ref 6.0–8.3)

## 2018-12-29 LAB — LIPASE: Lipase: 104 U/L — ABNORMAL HIGH (ref 11.0–59.0)

## 2018-12-29 MED ORDER — HYDROXYZINE HCL 10 MG PO TABS: ORAL_TABLET | ORAL | 0 refills | Status: DC

## 2018-12-29 MED ORDER — NEOMYCIN-POLYMYXIN-HC 3.5-10000-1 OT SOLN: 3.0000 [drp] | Freq: Four times a day (QID) | OTIC | 0 refills | Status: DC

## 2018-12-29 MED ORDER — KETOROLAC TROMETHAMINE 30 MG/ML IJ SOLN
30.0000 mg | Freq: Once | INTRAMUSCULAR | Status: AC
  Administered 2018-12-29: 30 mg via INTRAMUSCULAR

## 2018-12-29 NOTE — Telephone Encounter (Signed)
Referral to gi placed. °

## 2018-12-29 NOTE — Progress Notes (Signed)
Subjective:    Patient ID: [name], female    DOB: 1965/08/16, 53 y.o.   MRN: [id number]  HPI  Pt in today for follow up. See yesterday interview below. I wanted her to follow up to get ekg and appropiate labs as also had some abdomen complaints recently.  Brenda Merola Kirklandis a 53 y.o.femalepresenting to the ED with complaint of 2 days of left lateral neck pain. Pain is worse with movement and palpation, as well as laying on her left side. She reports she has intermittent shooting pains towards her head. No new midline neck or back pain. She is prescribed oxycodone chronically for chronic low back pain, though took naproxen today instead to help her symptoms. 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 No obvious injury. She states she began feeling some nausea today. No weakness or numbness of the upper extremities.  She agrees pain distribution location. Still has not eased up.   Pt states she had been using zanaflex and naprosyn before she went to the ED. Pt has oxyccodone available to use and take twice a day.This is for low back pain.  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   Pt has anxiety and is on buspar. She has hx of anxiety. She states this seems to upset her stomach. She has history of gastroparesis. Recently feels bloated.  Pt has history of intermittent chest pain. I referred to cardiologist. Pt got there late so she got scheduled 7th of December.  Pt states last time she had intermittent chest pain was on November 6 th. When patient get anxious get uncomfortable. But today on palpation left upper chest has upper pec pain medial to left shoulder.  No associated type cardiac type signs or symptoms in ED but did have left side neck pain. But reports pain intermittently in the past.  In addition some left ear pain recently. Hx of chronic intemitent ear pain.     Review of Systems  Constitutional:  Negative for chills, fatigue and fever.  Respiratory: Negative for cough, chest tightness and stridor.   Cardiovascular: Negative for chest pain and palpitations.       Some hx of atypical intermittent pain in the past.  Gastrointestinal: Positive for abdominal pain. Negative for constipation, nausea and vomiting.  Musculoskeletal: Positive for neck pain.  Neurological: Negative for dizziness, speech difficulty, numbness and headaches.  Hematological: Negative for adenopathy. Does not bruise/bleed easily.  Psychiatric/Behavioral: Negative for behavioral problems. The patient is nervous/anxious.     Past Medical History:  Diagnosis Date  . Cervical cancer ([place])    had surgery in 2001.  . Coronary artery disease    30% lesions noted  . Depression   . Diabetes mellitus   . GERD (gastroesophageal reflux disease)   . Hyperlipidemia   . Hypertension   . Neuropathy      Social History   Socioeconomic History  . Marital status: Single    Spouse name: Not on file  . Number of children: Not on file  . Years of education: Not on file  . Highest education level: Not on file  Occupational History  . Not on file  Social Needs  . Financial resource strain: Not on file  . Food insecurity    Worry: Not on file    Inability: Not on file  . Transportation needs    Medical: Not on file    Non-medical: Not on file  Tobacco Use  . Smoking status: Never Smoker  . Smokeless tobacco: Never Used  Substance and Sexual Activity  . Alcohol use: Yes    Comment: occ  . Drug use: No  . Sexual activity: Not on file  Lifestyle  . Physical activity    Days per week: Not on file    Minutes per session: Not on file  . Stress: Not on file  Relationships  . Social Herbalist on phone: Not on file    Gets together: Not on file    Attends religious service: Not on file    Active member of club or organization: Not on file    Attends meetings of clubs or organizations: Not on file     Relationship status: Not on file  . Intimate partner violence    Fear of current or ex partner: Not on file    Emotionally abused: Not on file    Physically abused: Not on file    Forced sexual activity: Not on file  Other Topics Concern  . Not on file  Social History Narrative  . Not on file    Past Surgical History:  Procedure Laterality Date  . ABDOMINAL HYSTERECTOMY     partial  . CHOLECYSTECTOMY      Family History  Problem Relation Age of Onset  . Diabetes Mother   . Diabetes Father     Allergies  Allergen Reactions  . Insulin Aspart Other (See Comments) and Swelling    adverse reaction to Novolog   . Latex Itching and Rash  . Morphine Itching and Rash    Current Outpatient Medications on File Prior to Visit  Medication Sig Dispense Refill  . Albuterol Sulfate (PROAIR RESPICLICK) 123XX123 (90 Base) MCG/ACT AEPB Inhale 2 puffs into the lungs at bedtime as needed for shortness of breath.    Marland Kitchen amLODipine (NORVASC) 5 MG tablet Take 1 tablet (5 mg total) by mouth daily. 90 tablet 0  . aspirin 81 MG tablet Take 81 mg by mouth daily.    Marland Kitchen atorvastatin (LIPITOR) 80 MG tablet Take 1 tablet (80 mg total) by mouth daily. 90 tablet 1  . busPIRone (BUSPAR) 15 MG tablet Take 1 tablet (15 mg total) by mouth 2 (two) times daily. 60 tablet 0  . carvedilol (COREG) 25 MG tablet Take 1 tablet (25 mg total) by mouth 2 (two) times daily with a meal. 60 tablet 0  . Cholecalciferol (VITAMIN D3) 400 units CAPS Take 400 mg by mouth daily.    . cimetidine (TAGAMET) 400 MG tablet Take 1 tablet (400 mg total) by mouth 2 (two) times daily. 60 tablet 3  . doxycycline (VIBRA-TABS) 100 MG tablet Take 1 tablet (100 mg total) by mouth 2 (two) times daily. Can give caps or generic. 6 tablet 0  . famciclovir (FAMVIR) 500 MG tablet Take 1 tablet (500 mg total) by mouth 3 (three) times daily. 21 tablet 0  . FARXIGA 10 MG TABS tablet Take 1 tablet by mouth once daily 90 tablet 0  . fluticasone (FLONASE) 50  MCG/ACT nasal spray Place 2 sprays into both nostrils daily. 16 g 1  . fluticasone furoate-vilanterol (BREO ELLIPTA) 100-25 MCG/INH AEPB Inhale 1 puff into the lungs daily.    Marland Kitchen gabapentin (NEURONTIN) 300 MG capsule Take 300 mg by mouth 4 (four) times daily as needed for pain.    . hydrochlorothiazide (MICROZIDE) 12.5 MG capsule TAKE 1 CAPSULE BY MOUTH ONCE DAILY FOR 3 DAYS 3  capsule 0  . insulin degludec (TRESIBA FLEXTOUCH) 100 UNIT/ML SOPN FlexTouch Pen Inject 40 Units into the skin daily at 10 pm.    . meloxicam (MOBIC) 7.5 MG tablet 1-2 tab po q day 30 tablet 0  . metoCLOPramide (REGLAN) 10 MG tablet Take 1 tablet (10 mg total) by mouth every 8 (eight) hours as needed for nausea. 20 tablet 0  . omeprazole (PRILOSEC) 20 MG capsule Take 1 capsule (20 mg total) by mouth daily. 30 capsule 3  . oxyCODONE (OXY IR/ROXICODONE) 5 MG immediate release tablet 1 tab po twice daily as needed for severe pain 45 tablet 0  . polyethylene glycol (MIRALAX / GLYCOLAX) packet Take 17 g by mouth daily. 14 each 0  . promethazine (PHENERGAN) 12.5 MG tablet TAKE 1 TABLET BY MOUTH EVERY 8 HOURS AS NEEDED FOR  NAUSEA  OR  VOMITING 20 tablet 0  . ranitidine (ZANTAC) 150 MG capsule Take 1 capsule (150 mg total) by mouth 2 (two) times daily. 60 capsule 3  . senna-docusate (SENOKOT-S) 8.6-50 MG tablet Take 2 tablets by mouth 2 (two) times daily. 60 tablet 1  . spironolactone (ALDACTONE) 25 MG tablet Take 1 tablet by mouth once daily 90 tablet 0  . sucralfate (CARAFATE) 1 g tablet Take 1 tablet (1 g total) by mouth 4 (four) times daily -  with meals and at bedtime. 120 tablet 0  . tizanidine (ZANAFLEX) 6 MG capsule Take 1 capsule (6 mg total) by mouth 3 (three) times daily as needed for muscle spasms. 21 capsule 0   No current facility-administered medications on file prior to visit.     BP 117/80 (BP Location: Right Arm, Patient Position: Sitting, Cuff Size: Small)   Pulse 79   Temp (!) 96.7 F (35.9 C) (Temporal)    Resp 16   Ht 6' (1.829 m)   Wt 185 lb (83.9 kg)   SpO2 100%   BMI 25.09 kg/m       Objective:   Physical Exam  General Mental Status- Alert. General Appearance- Not in acute distress.   Skin General: Color- Normal Color. Moisture- Normal Moisture.  Neck Carotid Arteries- Normal color. Moisture- Normal Moisture. No carotid bruits. No JVD. Left sternocleidomastoid tender to palpation.  Chest and Lung Exam Auscultation: Breath Sounds:-Normal.  Cardiovascular Auscultation:Rythm- Regular. Murmurs & Other Heart Sounds:Auscultation of the heart reveals- No Murmurs.  Abdomen Inspection:-Inspeection Normal. Palpation/Percussion:Note:No mass. Palpation and Percussion of the abdomen reveal- Non Tender, Non Distended + BS, no rebound or guarding.   Neurologic Cranial Nerve exam:- CN III-XII intact(No nystagmus), symmetric smile. Strength:- 5/5 equal and symmetric strength both upper and lower extremities.  heent- no sinus pressure. Left ear- tragus tender. Tm looks. Also some pain behind the ear.   anterior chest- on palpation left upper pec medial to shoulder reproducible pain.         Assessment & Plan:  [phone]  For your neck pain, we gave you toradol 30 mg im. Start higher dose muscle relaxant.   For atypical chest pain and pain reproducible on palpation, will get stat troponin x 1. Your ekg looks normal sinus rhythm. No change compared to prior. Keep appointment with cardiologist coming up. If pain worsens or changes then advise ED evaluation. Keep cell phone on, charge and volume up. If your test were to come back elevated then will advise ED evaluation.  For abdomen pain get labs, increase prilosec to twice daily. Eat healthy diet. If pain worsens/persist then will need to get  gi MD opinion on Reglan.   For anxiety DC buspar. Rx hydroxyzine.  For ear pain rx cortisporin otic.  Follow up 10 days or as needed  25 + minutes spent with pt. 50% of time spent  counseling on plan going forward.  Mackie Pai, PA-C

## 2018-12-29 NOTE — Patient Instructions (Addendum)
For your neck pain, we gave you toradol 30 mg im. Start higher dose muscle relaxant.   For atypical chest pain and pain reproducible on palpation, will get stat troponin x 1. Your ekg looks normal sinus rhythm. No change compared to prior. Keep appointment with cardiologist coming up. If pain worsens or changes then advise ED evaluation. Keep cell phone on, charge and volume up. If your test were to come back elevated then will advise ED evaluation.  For abdomen pain get labs, increase prilosec to twice daily. Eat healthy diet. If pain worsens/persist then will need to get gi MD opinion on Reglan.   For anxiety DC buspar. Rx hydroxyzine.  For ear pain rx cortisporin otic.  Follow up 10 days or as needed

## 2019-01-03 ENCOUNTER — Other Ambulatory Visit: Payer: Self-pay | Admitting: Medical

## 2019-01-03 NOTE — Telephone Encounter (Signed)
This medication is not delegated.  Sending to providers office

## 2019-01-03 NOTE — Telephone Encounter (Unsigned)
Copied from [place] [id number]. Topic: Quick Communication - Rx Refill/Question >> Jan 03, 2019 11:30 AM Yvette Rack wrote: Pt stated she would like enough medication to last her for the month.  Medication: oxyCODONE (OXY IR/ROXICODONE) 5 MG immediate release tablet  Has the patient contacted their pharmacy? no  Preferred Pharmacy (with phone number or street name): [place] [phone] (Phone) [phone] (Fax)  Agent: Please be advised that RX refills may take up to 3 business days. We ask that you follow-up with your pharmacy.

## 2019-01-07 ENCOUNTER — Other Ambulatory Visit: Payer: Self-pay | Admitting: Medical

## 2019-01-07 NOTE — Telephone Encounter (Signed)
carvedilol (COREG) 25 MG tablet , oxyCODONE (OXY IR/ROXICODONE) 5 MG immediate release tablet , FARXIGA 10 MG TABS tablet patient checking on the status of medication refill request, please advise    [place]

## 2019-01-08 NOTE — Telephone Encounter (Signed)
Pt needs controlled med contract signed before able to refill controlled med oxycodone.

## 2019-01-10 ENCOUNTER — Telehealth: Payer: Self-pay | Admitting: Medical

## 2019-01-10 DIAGNOSIS — Z79899 Other long term (current) drug therapy: Secondary | ICD-10-CM

## 2019-01-10 MED ORDER — OXYCODONE HCL 5 MG PO TABS: ORAL_TABLET | ORAL | 0 refills | Status: DC

## 2019-01-10 MED ORDER — MELOXICAM 7.5 MG PO TABS: ORAL_TABLET | ORAL | 0 refills | Status: DC

## 2019-01-10 MED ORDER — FARXIGA 10 MG PO TABS: 10.0000 mg | ORAL_TABLET | Freq: Every day | ORAL | 0 refills | Status: DC

## 2019-01-10 MED ORDER — CARVEDILOL 25 MG PO TABS: 25.0000 mg | ORAL_TABLET | Freq: Two times a day (BID) | ORAL | 0 refills | Status: DC

## 2019-01-10 NOTE — Telephone Encounter (Signed)
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

## 2019-01-10 NOTE — Telephone Encounter (Signed)
I sent in the refill but last refill unless she comes in to sign contract. Please advise her to go ahead and get scheduled so does not have problems going forward.

## 2019-01-10 NOTE — Telephone Encounter (Signed)
Pt came in office stating is needing refill on Oxycodone, Carvedilol,Dapagliflozin Propanediol Tab-(Farxiga) and Meloxicam. Pt would like rx sent to pharmacy asap. Pt stated if she needs to sign contract please let pt know when ready for pt to come to office and sign. Please advise.

## 2019-01-17 ENCOUNTER — Ambulatory Visit: Payer: 59 | Admitting: Cardiology

## 2019-01-18 ENCOUNTER — Telehealth: Payer: Self-pay | Admitting: Medical

## 2019-01-18 NOTE — Telephone Encounter (Signed)
Pt dropped off document to be filled out by provider ([place] paperwork 4 pages) Pt stated is needing extended dates on document, Pt would like to be called when document ready at [phone]. Document put at front office tray under providers name.

## 2019-01-18 NOTE — Telephone Encounter (Signed)
Left message on machine to see how much more time she needs.    Form placed in red folder.  Attached old fmla form that we completed back in October. Not sure if you would like to see her or not.

## 2019-01-18 NOTE — Telephone Encounter (Signed)
Spoke with [employer] and they stated that we can give authorization for the date of 12/29/18.  Authorization given because patient was here in our office for an appointment.  Appointment scheduled for patient to discuss to increase frequency/duration.  Per [employer] patient may need to adjust frequency/adjustment.

## 2019-01-18 NOTE — Telephone Encounter (Signed)
She needs appointment. Need to discuss this with her before filling out. She can be last patient of the day.

## 2019-01-19 ENCOUNTER — Encounter: Payer: Self-pay | Admitting: Medical

## 2019-01-19 ENCOUNTER — Other Ambulatory Visit: Payer: Self-pay

## 2019-01-19 ENCOUNTER — Ambulatory Visit (INDEPENDENT_AMBULATORY_CARE_PROVIDER_SITE_OTHER): Payer: 59 | Admitting: Medical

## 2019-01-19 VITALS — Wt 188.0 lb

## 2019-01-19 DIAGNOSIS — M545 Low back pain, unspecified: Secondary | ICD-10-CM

## 2019-01-19 NOTE — Progress Notes (Signed)
   Subjective:    Patient ID: [name], female    DOB: 07/17/1965, 53 y.o.   MRN: [id number]  HPI   Virtual Visit via Telephone Note  I connected with [name] on 01/19/19 at  9:20 AM EST by telephone and verified that I am speaking with the correct person using two identifiers.  Location: Patient: home Provider: office  In car and she is pulling over.   I discussed the limitations, risks, security and privacy concerns of performing an evaluation and management service by telephone and the availability of in person appointments. I also discussed with the patient that there may be a patient responsible charge related to this service. The patient expressed understanding and agreed to proceed.   History of Present Illness: Pt has chronic back pain. She misses work intermittently. She needs modification on number of days per episodes.  We discussed frequency of episodes and length. She agreed to  frequency of episodes 4, and length of episode 3 full days.  Also will addendum separate office visit time. Frequency of office visit 3 days per month. 3 hours per office visit.     Pt is currently has back pain flare presently. Similar to her chronic pain. Doing well with chronic meds for which she takes for pain.   Observations/Objective:  General- no acute distress, pleasant, oriented. Normal speech.   Assessment and Plan: For chronic back pain continue mobic and oxycodone. This flare seems to be within you normal range but if worsens or changes let us know.  We discussed specifics of frequency and length of episodes. Agreed upon time frames. If any issues with this let me know.  For gastroparesis follow up with GI MD as we discussed. Would recommend try probiotic daily. I don't think antibiotics needed unless h pylori found.  Follow up date to be determined after review GI MD work up.  Follow Up Instructions:    I discussed the assessment and treatment plan  with the patient. The patient was provided an opportunity to ask questions and all were answered. The patient agreed with the plan and demonstrated an understanding of the instructions.   The patient was advised to call back or seek an in-person evaluation if the symptoms worsen or if the condition fails to improve as anticipated.  I provided 25 minutes of non-face-to-face time during this encounter.   Mackie Pai, PA-C   Review of Systems  Constitutional: Negative for chills, fatigue and fever.  Respiratory: Negative for cough, chest tightness and wheezing.   Cardiovascular: Negative for chest pain and palpitations.  Gastrointestinal:       Hx of abd pain but no acute pain presently.  Musculoskeletal: Positive for back pain.       Objective:   Physical Exam        Assessment & Plan:

## 2019-01-19 NOTE — Patient Instructions (Signed)
For chronic back pain continue mobic and oxycodone. This flare seems to be within you normal range but if worsens or changes let us know.  We discussed specifics of frequency and length of episodes. Agreed upon time frames. If any issues with this let me know.  For gastroparesis follow up with GI MD as we discussed. Would recommend try probiotic daily. I don't think antibiotics needed unless h pylori found.  Follow up date to be determined after review GI MD work up.

## 2019-01-24 ENCOUNTER — Telehealth: Payer: Self-pay | Admitting: *Deleted

## 2019-01-24 NOTE — Telephone Encounter (Signed)
FMLA form faxed on 01/19/19

## 2019-02-01 ENCOUNTER — Encounter: Payer: Self-pay | Admitting: Cardiology

## 2019-02-01 ENCOUNTER — Ambulatory Visit (INDEPENDENT_AMBULATORY_CARE_PROVIDER_SITE_OTHER): Payer: 59 | Admitting: Cardiology

## 2019-02-01 ENCOUNTER — Other Ambulatory Visit: Payer: Self-pay

## 2019-02-01 VITALS — BP 98/70 | HR 84 | Ht 72.0 in | Wt 177.0 lb

## 2019-02-01 DIAGNOSIS — I209 Angina pectoris, unspecified: Secondary | ICD-10-CM

## 2019-02-01 DIAGNOSIS — E782 Mixed hyperlipidemia: Secondary | ICD-10-CM | POA: Insufficient documentation

## 2019-02-01 DIAGNOSIS — I10 Essential (primary) hypertension: Secondary | ICD-10-CM

## 2019-02-01 DIAGNOSIS — N1831 Chronic kidney disease, stage 3a: Secondary | ICD-10-CM

## 2019-02-01 DIAGNOSIS — E785 Hyperlipidemia, unspecified: Secondary | ICD-10-CM | POA: Insufficient documentation

## 2019-02-01 DIAGNOSIS — E1121 Type 2 diabetes mellitus with diabetic nephropathy: Secondary | ICD-10-CM

## 2019-02-01 DIAGNOSIS — E1122 Type 2 diabetes mellitus with diabetic chronic kidney disease: Secondary | ICD-10-CM

## 2019-02-01 DIAGNOSIS — R011 Cardiac murmur, unspecified: Secondary | ICD-10-CM

## 2019-02-01 HISTORY — DX: Cardiac murmur, unspecified: R01.1

## 2019-02-01 HISTORY — DX: Mixed hyperlipidemia: E78.2

## 2019-02-01 HISTORY — DX: Hyperlipidemia, unspecified: E78.5

## 2019-02-01 MED ORDER — NITROGLYCERIN 0.4 MG SL SUBL: 0.4000 mg | SUBLINGUAL_TABLET | SUBLINGUAL | 3 refills | Status: DC | PRN

## 2019-02-01 NOTE — Patient Instructions (Addendum)
Medication Instructions:  Your physician has recommended you make the following change in your medication:  START taking nitroglycerin as needed for chest pain, When having chest pain, stop what you are doing and sit down. Take 1 nitro, wait 5 minutes. Still having chest pain, take 1 nitro, wait 5 minutes. Still having chest pain, take 1 nitro, dial 911. Total of 3 nitro in 15 minutes.  If you need a refill on your cardiac medications before your next appointment, please call your pharmacy.   Lab work: NONE If you have labs (blood work) drawn today and your tests are completely normal, you will receive your results only by: [facility] MyChart Message (if you have MyChart) OR . A paper copy in the mail If you have any lab test that is abnormal or we need to change your treatment, we will call you to review the results.  Testing/Procedures:  Your physician has requested that you have an echocardiogram. Echocardiography is a painless test that uses sound waves to create images of your heart. It provides your doctor with information about the size and shape of your heart and how well your heart's chambers and valves are working. This procedure takes approximately one hour. There are no restrictions for this procedure.  Your physician has requested that you have a lexiscan myoview. For further information please visit [email or web address]. Please follow instruction sheet, as given.    Follow-Up: At [facility], you and your health needs are our priority.  As part of our continuing mission to provide you with exceptional heart care, we have created designated Provider Care Teams.  These Care Teams include your primary Cardiologist (physician) and Advanced Practice Providers (APPs -  Physician Assistants and Nurse Practitioners) who all work together to provide you with the care you need, when you need it. You will need a follow up appointment in 2 months.   Any Other Special Instructions Will Be Listed  Below  Regadenoson injection What is this medicine? REGADENOSON is used to test the heart for coronary artery disease. It is used in patients who can not exercise for their stress test. This medicine may be used for other purposes; ask your health care provider or pharmacist if you have questions. COMMON BRAND NAME(S): Lexiscan What should I tell my health care provider before I take this medicine? They need to know if you have any of these conditions:  heart problems  lung or breathing disease, like asthma or COPD  an unusual or allergic reaction to regadenoson, other medicines, foods, dyes, or preservatives  pregnant or trying to get pregnant  breast-feeding How should I use this medicine? This medicine is for injection into a vein. It is given by a health care professional in a hospital or clinic setting. Talk to your pediatrician regarding the use of this medicine in children. Special care may be needed. Overdosage: If you think you have taken too much of this medicine contact a poison control center or emergency room at once. NOTE: This medicine is only for you. Do not share this medicine with others. What if I miss a dose? This does not apply. What may interact with this medicine?  caffeine  dipyridamole  guarana  theophylline This list may not describe all possible interactions. Give your health care provider a list of all the medicines, herbs, non-prescription drugs, or dietary supplements you use. Also tell them if you smoke, drink alcohol, or use illegal drugs. Some items may interact with your medicine. What should I watch  for while using this medicine? Your condition will be monitored carefully while you are receiving this medicine. Do not take medicines, foods, or drinks with caffeine (like coffee, tea, or colas) for at least 12 hours before your test. If you do not know if something contains caffeine, ask your health care professional. What side effects may I  notice from receiving this medicine? Side effects that you should report to your doctor or health care professional as soon as possible:  allergic reactions like skin rash, itching or hives, swelling of the face, lips, or tongue  breathing problems  chest pain, tightness or palpitations  severe headache Side effects that usually do not require medical attention (report to your doctor or health care professional if they continue or are bothersome):  flushing  headache  irritation or pain at site where injected  nausea, vomiting This list may not describe all possible side effects. Call your doctor for medical advice about side effects. You may report side effects to FDA at 1-800-FDA-1088. Where should I keep my medicine? This drug is given in a hospital or clinic and will not be stored at home. NOTE: This sheet is a summary. It may not cover all possible information. If you have questions about this medicine, talk to your doctor, pharmacist, or health care provider.  2020 Elsevier/Gold Standard (2007-09-27 15:08:13)  Cardiac Nuclear Scan A cardiac nuclear scan is a test that is done to check the flow of blood to your heart. It is done when you are resting and when you are exercising. The test looks for problems such as:  Not enough blood reaching a portion of the heart.  The heart muscle not working as it should. You may need this test if:  You have heart disease.  You have had lab results that are not normal.  You have had heart surgery or a balloon procedure to open up blocked arteries (angioplasty).  You have chest pain.  You have shortness of breath. In this test, a special dye (tracer) is put into your bloodstream. The tracer will travel to your heart. A camera will then take pictures of your heart to see how the tracer moves through your heart. This test is usually done at a hospital and takes 2-4 hours. Tell a doctor about:  Any allergies you have.  All medicines  you are taking, including vitamins, herbs, eye drops, creams, and over-the-counter medicines.  Any problems you or family members have had with anesthetic medicines.  Any blood disorders you have.  Any surgeries you have had.  Any medical conditions you have.  Whether you are pregnant or may be pregnant. What are the risks? Generally, this is a safe test. However, problems may occur, such as:  Serious chest pain and heart attack. This is only a risk if the stress portion of the test is done.  Rapid heartbeat.  A feeling of warmth in your chest. This feeling usually does not last long.  Allergic reaction to the tracer. What happens before the test?  Ask your doctor about changing or stopping your normal medicines. This is important.  Follow instructions from your doctor about what you cannot eat or drink.  Remove your jewelry on the day of the test. What happens during the test?  An IV tube will be inserted into one of your veins.  Your doctor will give you a small amount of tracer through the IV tube.  You will wait for 20-40 minutes while the tracer moves through  your bloodstream.  Your heart will be monitored with an electrocardiogram (ECG).  You will lie down on an exam table.  Pictures of your heart will be taken for about 15-20 minutes.  You may also have a stress test. For this test, one of these things may be done: ? You will be asked to exercise on a treadmill or a stationary bike. ? You will be given medicines that will make your heart work harder. This is done if you are unable to exercise.  When blood flow to your heart has peaked, a tracer will again be given through the IV tube.  After 20-40 minutes, you will get back on the exam table. More pictures will be taken of your heart.  Depending on the tracer that is used, more pictures may need to be taken 3-4 hours later.  Your IV tube will be removed when the test is over. The test may vary among doctors  and hospitals. What happens after the test?  Ask your doctor: ? Whether you can return to your normal schedule, including diet, activities, and medicines. ? Whether you should drink more fluids. This will help to remove the tracer from your body. Drink enough fluid to keep your pee (urine) pale yellow.  Ask your doctor, or the department that is doing the test: ? When will my results be ready? ? How will I get my results? Summary  A cardiac nuclear scan is a test that is done to check the flow of blood to your heart.  Tell your doctor whether you are pregnant or may be pregnant.  Before the test, ask your doctor about changing or stopping your normal medicines. This is important.  Ask your doctor whether you can return to your normal activities. You may be asked to drink more fluids. This information is not intended to replace advice given to you by your health care provider. Make sure you discuss any questions you have with your health care provider. Document Released: 07/13/2017 Document Revised: 05/19/2018 Document Reviewed: 07/13/2017 Elsevier Patient Education  [employer] [place].  Echocardiogram An echocardiogram is a procedure that uses painless sound waves (ultrasound) to produce an image of the heart. Images from an echocardiogram can provide important information about:  Signs of coronary artery disease (CAD).  Aneurysm detection. An aneurysm is a weak or damaged part of an artery wall that bulges out from the normal force of blood pumping through the body.  Heart size and shape. Changes in the size or shape of the heart can be associated with certain conditions, including heart failure, aneurysm, and CAD.  Heart muscle function.  Heart valve function.  Signs of a past heart attack.  Fluid buildup around the heart.  Thickening of the heart muscle.  A tumor or infectious growth around the heart valves. Tell a health care provider about:  Any allergies you  have.  All medicines you are taking, including vitamins, herbs, eye drops, creams, and over-the-counter medicines.  Any blood disorders you have.  Any surgeries you have had.  Any medical conditions you have.  Whether you are pregnant or may be pregnant. What are the risks? Generally, this is a safe procedure. However, problems may occur, including:  Allergic reaction to dye (contrast) that may be used during the procedure. What happens before the procedure? No specific preparation is needed. You may eat and drink normally. What happens during the procedure?   An IV tube may be inserted into one of your veins.  You may receive contrast through this tube. A contrast is an injection that improves the quality of the pictures from your heart.  A gel will be applied to your chest.  A wand-like tool (transducer) will be moved over your chest. The gel will help to transmit the sound waves from the transducer.  The sound waves will harmlessly bounce off of your heart to allow the heart images to be captured in real-time motion. The images will be recorded on a computer. The procedure may vary among health care providers and hospitals. What happens after the procedure?  You may return to your normal, everyday life, including diet, activities, and medicines, unless your health care provider tells you not to do that. Summary  An echocardiogram is a procedure that uses painless sound waves (ultrasound) to produce an image of the heart.  Images from an echocardiogram can provide important information about the size and shape of your heart, heart muscle function, heart valve function, and fluid buildup around your heart.  You do not need to do anything to prepare before this procedure. You may eat and drink normally.  After the echocardiogram is completed, you may return to your normal, everyday life, unless your health care provider tells you not to do that. This information is not  intended to replace advice given to you by your health care provider. Make sure you discuss any questions you have with your health care provider. Document Released: 01/25/2000 Document Revised: 05/20/2018 Document Reviewed: 03/01/2016 Elsevier Patient Education  [employer]. Nitroglycerin sublingual tablets What is this medicine? NITROGLYCERIN (nye troe GLI ser in) is a type of vasodilator. It relaxes blood vessels, increasing the blood and oxygen supply to your heart. This medicine is used to relieve chest pain caused by angina. It is also used to prevent chest pain before activities like climbing stairs, going outdoors in cold weather, or sexual activity. This medicine may be used for other purposes; ask your health care provider or pharmacist if you have questions. COMMON BRAND NAME(S): Nitroquick, Nitrostat, Nitrotab What should I tell my health care provider before I take this medicine? They need to know if you have any of these conditions:  anemia  head injury, recent stroke, or bleeding in the brain  liver disease  previous heart attack  an unusual or allergic reaction to nitroglycerin, other medicines, foods, dyes, or preservatives  pregnant or trying to get pregnant  breast-feeding How should I use this medicine? Take this medicine by mouth as needed. At the first sign of an angina attack (chest pain or tightness) place one tablet under your tongue. You can also take this medicine 5 to 10 minutes before an event likely to produce chest pain. Follow the directions on the prescription label. Let the tablet dissolve under the tongue. Do not swallow whole. Replace the dose if you accidentally swallow it. It will help if your mouth is not dry. Saliva around the tablet will help it to dissolve more quickly. Do not eat or drink, smoke or chew tobacco while a tablet is dissolving. If you are not better within 5 minutes after taking ONE dose of nitroglycerin, call 9-1-1 immediately  to seek emergency medical care. Do not take more than 3 nitroglycerin tablets over 15 minutes. If you take this medicine often to relieve symptoms of angina, your doctor or health care professional may provide you with different instructions to manage your symptoms. If symptoms do not go away after following these instructions, it is important  to call 9-1-1 immediately. Do not take more than 3 nitroglycerin tablets over 15 minutes. Talk to your pediatrician regarding the use of this medicine in children. Special care may be needed. Overdosage: If you think you have taken too much of this medicine contact a poison control center or emergency room at once. NOTE: This medicine is only for you. Do not share this medicine with others. What if I miss a dose? This does not apply. This medicine is only used as needed. What may interact with this medicine? Do not take this medicine with any of the following medications:  certain migraine medicines like ergotamine and dihydroergotamine (DHE)  medicines used to treat erectile dysfunction like sildenafil, tadalafil, and vardenafil  riociguat This medicine may also interact with the following medications:  alteplase  aspirin  heparin  medicines for high blood pressure  medicines for mental depression  other medicines used to treat angina  phenothiazines like chlorpromazine, mesoridazine, prochlorperazine, thioridazine This list may not describe all possible interactions. Give your health care provider a list of all the medicines, herbs, non-prescription drugs, or dietary supplements you use. Also tell them if you smoke, drink alcohol, or use illegal drugs. Some items may interact with your medicine. What should I watch for while using this medicine? Tell your doctor or health care professional if you feel your medicine is no longer working. Keep this medicine with you at all times. Sit or lie down when you take your medicine to prevent falling if  you feel dizzy or faint after using it. Try to remain calm. This will help you to feel better faster. If you feel dizzy, take several deep breaths and lie down with your feet propped up, or bend forward with your head resting between your knees. You may get drowsy or dizzy. Do not drive, use machinery, or do anything that needs mental alertness until you know how this drug affects you. Do not stand or sit up quickly, especially if you are an older patient. This reduces the risk of dizzy or fainting spells. Alcohol can make you more drowsy and dizzy. Avoid alcoholic drinks. Do not treat yourself for coughs, colds, or pain while you are taking this medicine without asking your doctor or health care professional for advice. Some ingredients may increase your blood pressure. What side effects may I notice from receiving this medicine? Side effects that you should report to your doctor or health care professional as soon as possible:  blurred vision  dry mouth  skin rash  sweating  the feeling of extreme pressure in the head  unusually weak or tired Side effects that usually do not require medical attention (report to your doctor or health care professional if they continue or are bothersome):  flushing of the face or neck  headache  irregular heartbeat, palpitations  nausea, vomiting This list may not describe all possible side effects. Call your doctor for medical advice about side effects. You may report side effects to FDA at 1-800-FDA-1088. Where should I keep my medicine? Keep out of the reach of children. Store at room temperature between 20 and 25 degrees C (68 and 77 degrees F). Store in [facility] [employer]. Protect from light and moisture. Keep tightly closed. Throw away any unused medicine after the expiration date. NOTE: This sheet is a summary. It may not cover all possible information. If you have questions about this medicine, talk to your doctor, pharmacist, or health care  provider.  2020 Elsevier/Gold Standard (2012-11-25 17:57:36)

## 2019-02-01 NOTE — Progress Notes (Signed)
Cardiology Office Note:    Date:  02/01/2019   ID:  [name], DOB 1965-12-28, MRN [id number]  PCP:  Mackie Pai, PA-C  Cardiologist:  Jenean Lindau, MD   Referring MD: Mackie Pai, PA-C    ASSESSMENT:    1. Essential hypertension   2. Angina pectoris ([place])   3. Type 2 diabetes mellitus with stage 3a chronic kidney disease, without long-term current use of insulin ([place])   4. Mixed dyslipidemia   5. Cardiac murmur    PLAN:    In order of problems listed above:  1. Chest discomfort: Primary prevention stressed with the patient.  Importance of compliance with diet and medication stressed and she vocalized understanding.  In view of her symptoms she will undergo Lexiscan sestamibi.  I would prefer not to use contrast iodine dye as my initial investigation in view of her renal insufficiency and diabetes and I discussed this with her and she vocalized understanding.  She takes a coated baby aspirin on a regular basis.  Sublingual nitroglycerin prescription was sent, its protocol and 911 protocol explained and the patient vocalized understanding questions were answered to the patient's satisfaction 2. Essential hypertension: Blood pressure stable.  She monitors it closely and generally is in the range of 120/70.  Echocardiogram will be done to assess murmur heard on auscultation 3. Mixed dyslipidemia and diabetes mellitus: Diet was discussed and she promises to do better.  Lipids followed by primary care physician for his diabetes. 4. Patient will be seen in follow-up appointment in 6 months or earlier if the patient has any concerns    Medication Adjustments/Labs and Tests Ordered: Current medicines are reviewed at length with the patient today.  Concerns regarding medicines are outlined above.  Orders Placed This Encounter  Procedures  . MYOCARDIAL PERFUSION IMAGING  . ECHOCARDIOGRAM COMPLETE   Meds ordered this encounter  Medications  . nitroGLYCERIN  (NITROSTAT) 0.4 MG SL tablet    Sig: Place 1 tablet (0.4 mg total) under the tongue every 5 (five) minutes as needed.    Dispense:  25 tablet    Refill:  3     History of Present Illness:    [name] is a 53 y.o. female who is being seen today for the evaluation of chest discomfort at the request of [name], [place].  Patient is a pleasant 53 year old female with past medical history of essential hypertension, mixed dyslipidemia, diabetes mellitus and renal insufficiency.  She mentions to me that she occasionally has chest tightness.  This occurs when she is stressed out.  She does not exercise much.  She does 2 jobs.  No orthopnea or PND.  No radiation of the symptoms to the neck or to the arms.  She was concerned about this.  For this reason she is here for an evaluation.  At the time of my evaluation, the patient is alert awake oriented and in no distress.  Past Medical History:  Diagnosis Date  . Cervical cancer ([place])    had surgery in 2001.  . Coronary artery disease    30% lesions noted  . Depression   . Diabetes mellitus   . GERD (gastroesophageal reflux disease)   . Hyperlipidemia   . Hypertension   . Neuropathy     Past Surgical History:  Procedure Laterality Date  . ABDOMINAL HYSTERECTOMY     partial  . CHOLECYSTECTOMY      Current Medications: Current Meds  Medication Sig  . Albuterol Sulfate (PROAIR  RESPICLICK) 123XX123 (90 Base) MCG/ACT AEPB Inhale 2 puffs into the lungs at bedtime as needed for shortness of breath.  Marland Kitchen amLODipine (NORVASC) 5 MG tablet Take 1 tablet (5 mg total) by mouth daily.  Marland Kitchen aspirin 81 MG tablet Take 81 mg by mouth daily.  Marland Kitchen atorvastatin (LIPITOR) 80 MG tablet Take 1 tablet (80 mg total) by mouth daily.  . busPIRone (BUSPAR) 15 MG tablet Take 1 tablet (15 mg total) by mouth 2 (two) times daily.  . carvedilol (COREG) 25 MG tablet Take 1 tablet (25 mg total) by mouth 2 (two) times daily with a meal.  . Cholecalciferol (VITAMIN D3)  400 units CAPS Take 400 mg by mouth daily.  . dapagliflozin propanediol (FARXIGA) 10 MG TABS tablet Take 10 mg by mouth daily.  Marland Kitchen doxepin (SINEQUAN) 25 MG capsule Take 25 mg by mouth at bedtime.  . fluticasone (FLONASE) 50 MCG/ACT nasal spray Place 2 sprays into both nostrils daily.  . fluticasone furoate-vilanterol (BREO ELLIPTA) 100-25 MCG/INH AEPB Inhale 1 puff into the lungs daily.  Marland Kitchen gabapentin (NEURONTIN) 300 MG capsule Take 300 mg by mouth 4 (four) times daily as needed for pain.  . hydrochlorothiazide (MICROZIDE) 12.5 MG capsule TAKE 1 CAPSULE BY MOUTH ONCE DAILY FOR 3 DAYS  . hydrOXYzine (ATARAX/VISTARIL) 10 MG tablet 1-2 tab p q 8 hours  . insulin degludec (TRESIBA FLEXTOUCH) 100 UNIT/ML SOPN FlexTouch Pen Inject 40 Units into the skin daily at 10 pm.  . meloxicam (MOBIC) 7.5 MG tablet 1-2 tab po q day  . metFORMIN (GLUCOPHAGE-XR) 500 MG 24 hr tablet 2 (two) times daily.  Marland Kitchen omeprazole (PRILOSEC) 20 MG capsule Take 1 capsule (20 mg total) by mouth daily.  Marland Kitchen oxyCODONE (OXY IR/ROXICODONE) 5 MG immediate release tablet 1 tab po twice daily as needed for severe pain  . polyethylene glycol (MIRALAX / GLYCOLAX) packet Take 17 g by mouth daily.  . promethazine (PHENERGAN) 12.5 MG tablet TAKE 1 TABLET BY MOUTH EVERY 8 HOURS AS NEEDED FOR  NAUSEA  OR  VOMITING  . spironolactone (ALDACTONE) 25 MG tablet Take 1 tablet by mouth once daily  . tizanidine (ZANAFLEX) 6 MG capsule Take 1 capsule (6 mg total) by mouth 3 (three) times daily as needed for muscle spasms.     Allergies:   Insulin aspart, Latex, and Morphine   Social History   Socioeconomic History  . Marital status: Single    Spouse name: Not on file  . Number of children: Not on file  . Years of education: Not on file  . Highest education level: Not on file  Occupational History  . Not on file  Tobacco Use  . Smoking status: Never Smoker  . Smokeless tobacco: Never Used  Substance and Sexual Activity  . Alcohol use: Yes     Comment: occ  . Drug use: No  . Sexual activity: Not on file  Other Topics Concern  . Not on file  Social History Narrative  . Not on file   Social Determinants of Health   Financial Resource Strain:   . Difficulty of Paying Living Expenses: Not on file  Food Insecurity:   . Worried About Charity fundraiser in the Last Year: Not on file  . Ran Out of Food in the Last Year: Not on file  Transportation Needs:   . Lack of Transportation (Medical): Not on file  . Lack of Transportation (Non-Medical): Not on file  Physical Activity:   . Days of Exercise per  Week: Not on file  . Minutes of Exercise per Session: Not on file  Stress:   . Feeling of Stress : Not on file  Social Connections:   . Frequency of Communication with Friends and Family: Not on file  . Frequency of Social Gatherings with Friends and Family: Not on file  . Attends Religious Services: Not on file  . Active Member of Clubs or Organizations: Not on file  . Attends Archivist Meetings: Not on file  . Marital Status: Not on file     Family History: The patient's family history includes Diabetes in her father and mother.  ROS:   Please see the history of present illness.    All other systems reviewed and are negative.  EKGs/Labs/Other Studies Reviewed:    The following studies were reviewed today: EKG reveals sinus rhythm and nonspecific ST-T changes.   Recent Labs: 06/09/2018: Pro B Natriuretic peptide (BNP) 22.0 12/29/2018: ALT 23; BUN 18; Creatinine, Ser 1.21; Hemoglobin 12.6; Platelets 284.0; Potassium 4.5; Sodium 139  Recent Lipid Panel    Component Value Date/Time   CHOL  07/18/2009 0355    193        ATP III CLASSIFICATION:  <200     mg/dL   Desirable  200-239  mg/dL   Borderline High  >=240    mg/dL   High          TRIG 61 07/18/2009 0355   HDL 40 07/18/2009 0355   CHOLHDL 4.8 07/18/2009 0355   VLDL 12 07/18/2009 0355   LDLCALC (H) 07/18/2009 0355    141        Total  Cholesterol/HDL:CHD Risk Coronary Heart Disease Risk Table                     Men   Women  1/2 Average Risk   3.4   3.3  Average Risk       5.0   4.4  2 X Average Risk   9.6   7.1  3 X Average Risk  23.4   11.0        Use the calculated Patient Ratio above and the CHD Risk Table to determine the patient's CHD Risk.        ATP III CLASSIFICATION (LDL):  <100     mg/dL   Optimal  100-129  mg/dL   Near or Above                    Optimal  130-159  mg/dL   Borderline  160-189  mg/dL   High  >190     mg/dL   Very High    Physical Exam:    VS:  BP 98/70 (BP Location: Right Arm, Patient Position: Sitting, Cuff Size: Normal)   Pulse 84   Ht 6' (1.829 m)   Wt 177 lb (80.3 kg)   SpO2 99%   BMI 24.01 kg/m     Wt Readings from Last 3 Encounters:  02/01/19 177 lb (80.3 kg)  01/19/19 188 lb (85.3 kg)  12/29/18 185 lb (83.9 kg)     GEN: Patient is in no acute distress HEENT: Normal NECK: No JVD; No carotid bruits LYMPHATICS: No lymphadenopathy CARDIAC: S1 S2 regular, 2/6 systolic murmur at the apex. RESPIRATORY:  Clear to auscultation without rales, wheezing or rhonchi  ABDOMEN: Soft, non-tender, non-distended MUSCULOSKELETAL:  No edema; No deformity  SKIN: Warm and dry NEUROLOGIC:  Alert and oriented x 3 PSYCHIATRIC:  Normal affect    Signed, Jenean Lindau, MD  02/01/2019 10:17 AM    [facility]

## 2019-02-02 ENCOUNTER — Telehealth (HOSPITAL_COMMUNITY): Payer: Self-pay | Admitting: *Deleted

## 2019-02-02 NOTE — Telephone Encounter (Signed)
Left message on voicemail in reference to upcoming appointment scheduled for 02/09/19. Phone number given for a call back so details instructions can be given. Kirstie Peri

## 2019-02-09 ENCOUNTER — Encounter (HOSPITAL_COMMUNITY): Payer: 59

## 2019-02-09 ENCOUNTER — Other Ambulatory Visit: Payer: Self-pay | Admitting: Medical

## 2019-02-10 ENCOUNTER — Other Ambulatory Visit: Payer: Self-pay | Admitting: Medical

## 2019-02-10 MED ORDER — AMLODIPINE BESYLATE 5 MG PO TABS: 5.0000 mg | ORAL_TABLET | Freq: Every day | ORAL | 0 refills | Status: DC

## 2019-02-10 MED ORDER — MELOXICAM 7.5 MG PO TABS: ORAL_TABLET | ORAL | 0 refills | Status: DC

## 2019-02-10 NOTE — Telephone Encounter (Signed)
Copied from [place] [id number]. Topic: Quick Communication - Rx Refill/Question >> Feb 10, 2019 12:27 PM Mcneil, Ja-Kwan wrote: Medication: amLODipine (NORVASC) 5 MG tablet, meloxicam (MOBIC) 7.5 MG tablet, and tizanidine (ZANAFLEX) 6 MG capsule  Has the patient contacted their pharmacy? yes   Preferred Pharmacy (with phone number or street name): [facility] [place]  Phone: [phone]  Fax: [phone]  Agent: Please be advised that RX refills may take up to 3 business days. We ask that you follow-up with your pharmacy.

## 2019-02-10 NOTE — Telephone Encounter (Signed)
Requested medication (s) are due for refill today: yes  Requested medication (s) are on the active medication list: yes  Last refill:  12/28/2018  Future visit scheduled: no  Notes to clinic:  This refill cannot be delegated    Requested Prescriptions  Pending Prescriptions Disp Refills   tizanidine (ZANAFLEX) 6 MG capsule 21 capsule 0    Sig: Take 1 capsule (6 mg total) by mouth 3 (three) times daily as needed for muscle spasms.      Not Delegated - Cardiovascular:  Alpha-2 Agonists - tizanidine Failed - 02/10/2019 12:36 PM      Failed - This refill cannot be delegated      Passed - Valid encounter within last 6 months    Recent Outpatient Visits           3 weeks ago Lumbar back pain   [facility] at [place], [place]   1 month ago Atypical chest pain   [facility] at [place], PA-C   1 month ago [place] [facility] [place], PA-C   1 month ago Skin infection   [facility] at [place], [place]   2 months ago Other fatigue   [facility] at [place], [employer]       Future Appointments             In 2 months Revankar, Reita Cliche, MD [facility] [employer]             Signed Prescriptions Disp Refills   amLODipine (NORVASC) 5 MG tablet 90 tablet 0    Sig: Take 1 tablet (5 mg total) by mouth daily.      Cardiovascular:  Calcium Channel Blockers Passed - 02/10/2019 12:36 PM      Passed - Last BP in normal range    BP Readings from Last 1 Encounters:  02/01/19 98/70          Passed - Valid encounter within last 6 months    Recent Outpatient Visits           3 weeks ago Lumbar back pain   [facility] at [employer] [facility] [place], [place]   1 month ago Atypical chest pain   [facility] at [place], [place]   1 month ago [place] [facility] [place], [place]   1 month ago Skin infection   [facility] at [place], [place]   2 months ago Other fatigue   [facility] at [place], [employer]       Future Appointments             In 2 months Revankar, Reita Cliche, MD [facility] [place]              meloxicam (MOBIC) 7.5 MG tablet 30 tablet 0    Sig: 1-2 tab po q day      Analgesics:  COX2 Inhibitors Failed - 02/10/2019 12:36 PM      Failed - Cr in normal range and within 360 days    Creatinine, Ser  Date Value Ref Range Status  12/29/2018 1.21 (H) 0.40 - 1.20 mg/dL Final   Creatinine,U  Date Value Ref Range  Status  07/18/2009  mg/dL Final   139.3 (NOTE)  Cutoff Values for Urine Drug Screen:        Drug Class           Cutoff (ng/mL)        Amphetamines            1000        Barbiturates             200        Cocaine Metabolites      300        Benzodiazepines          200        Methadone                 300        Opiates                 2000        Phencyclidine             25        Propoxyphene             300        Marijuana Metabolites     50  For medical purposes only.          Passed - HGB in normal range and within 360 days    Hemoglobin  Date Value Ref Range Status  12/29/2018 12.6 12.0 - 15.0 g/dL Final          Passed - Patient is not pregnant      Passed - Valid encounter within last 12 months    Recent Outpatient Visits           3 weeks ago Lumbar back pain   [facility] at [place], [place]   1 month ago Atypical chest pain   [facility] at [place], [place]   1 month ago [place] [facility] [place], [place]   1 month ago Skin infection    Archivist at [place], [place]   2 months ago Other fatigue   [facility] at [place], [place]       Future Appointments             In 2 months Revankar, Reita Cliche, MD [place]

## 2019-02-11 ENCOUNTER — Telehealth: Payer: Self-pay | Admitting: Medical

## 2019-02-11 NOTE — Telephone Encounter (Signed)
Rx zanaflex sent to pt pharmacy.

## 2019-02-15 ENCOUNTER — Encounter: Payer: Self-pay | Admitting: *Deleted

## 2019-02-15 ENCOUNTER — Ambulatory Visit (HOSPITAL_BASED_OUTPATIENT_CLINIC_OR_DEPARTMENT_OTHER): Payer: 59

## 2019-02-15 ENCOUNTER — Ambulatory Visit (HOSPITAL_COMMUNITY): Payer: 59 | Attending: Cardiology

## 2019-02-15 ENCOUNTER — Other Ambulatory Visit (HOSPITAL_BASED_OUTPATIENT_CLINIC_OR_DEPARTMENT_OTHER): Payer: 59

## 2019-02-15 ENCOUNTER — Other Ambulatory Visit: Payer: Self-pay

## 2019-02-15 ENCOUNTER — Encounter (HOSPITAL_COMMUNITY): Payer: 59

## 2019-02-15 VITALS — Ht 72.0 in | Wt 177.0 lb

## 2019-02-15 DIAGNOSIS — R079 Chest pain, unspecified: Secondary | ICD-10-CM

## 2019-02-15 DIAGNOSIS — I10 Essential (primary) hypertension: Secondary | ICD-10-CM | POA: Diagnosis not present

## 2019-02-15 DIAGNOSIS — I209 Angina pectoris, unspecified: Secondary | ICD-10-CM

## 2019-02-15 LAB — MYOCARDIAL PERFUSION IMAGING
LV dias vol: 66 mL (ref 46–106)
LV sys vol: 35 mL
Peak HR: 100 {beats}/min
Rest HR: 82 {beats}/min
SDS: 3
SRS: 2
SSS: 5
TID: 0.91

## 2019-02-15 MED ORDER — REGADENOSON 0.4 MG/5ML IV SOLN
0.4000 mg | Freq: Once | INTRAVENOUS | Status: AC
  Administered 2019-02-15: 0.4 mg via INTRAVENOUS

## 2019-02-15 MED ORDER — TECHNETIUM TC 99M TETROFOSMIN IV KIT
10.8000 | PACK | Freq: Once | INTRAVENOUS | Status: AC | PRN
  Administered 2019-02-15: 10.8 via INTRAVENOUS
  Filled 2019-02-15: qty 11

## 2019-02-15 MED ORDER — TECHNETIUM TC 99M TETROFOSMIN IV KIT
31.6000 | PACK | Freq: Once | INTRAVENOUS | Status: AC | PRN
  Administered 2019-02-15: 31.6 via INTRAVENOUS
  Filled 2019-02-15: qty 32

## 2019-02-25 ENCOUNTER — Other Ambulatory Visit: Payer: Self-pay | Admitting: Medical

## 2019-02-25 ENCOUNTER — Telehealth: Payer: Self-pay | Admitting: Medical

## 2019-02-25 MED ORDER — CARVEDILOL 25 MG PO TABS: 25.0000 mg | ORAL_TABLET | Freq: Two times a day (BID) | ORAL | 0 refills | Status: DC

## 2019-02-25 MED ORDER — SPIRONOLACTONE 25 MG PO TABS: 25.0000 mg | ORAL_TABLET | Freq: Every day | ORAL | 0 refills | Status: DC

## 2019-02-25 NOTE — Telephone Encounter (Signed)
Spoke to patient and sent medications to pharmacy

## 2019-02-25 NOTE — Telephone Encounter (Signed)
Medication Refill - Medication:  spironolactone (ALDACTONE) 25 MG tablet  Has the patient contacted their pharmacy?  Yes advised to call office.   Preferred Pharmacy (with phone number or street name):  [place] Phone:  [phone]  Fax:  [phone]     Agent: Please be advised that RX refills may take up to 3 business days. We ask that you follow-up with your pharmacy.

## 2019-02-25 NOTE — Telephone Encounter (Signed)
Patient called in stating that pharmacy still does not have this prescription for her. Pt is wanting this sent in as she stated she has a vacation coming up and does not want to be without it. Checked status of medication and states script failed to send. Please advise.

## 2019-03-09 ENCOUNTER — Ambulatory Visit (INDEPENDENT_AMBULATORY_CARE_PROVIDER_SITE_OTHER): Payer: 59 | Admitting: Medical

## 2019-03-09 ENCOUNTER — Ambulatory Visit: Payer: 59

## 2019-03-09 ENCOUNTER — Encounter: Payer: Self-pay | Admitting: Medical

## 2019-03-09 ENCOUNTER — Other Ambulatory Visit: Payer: Self-pay

## 2019-03-09 VITALS — Ht 72.0 in | Wt 177.0 lb

## 2019-03-09 DIAGNOSIS — M545 Low back pain, unspecified: Secondary | ICD-10-CM

## 2019-03-09 NOTE — Patient Instructions (Signed)
You have flare of your back pain with sciatica feature despite your current regiment of 3 meds. I think at this point consecutive days off from work would be beneficial. Try to add salon pas lidocaine patch to left si area. Continue oxycodone and muscle relaxant. Hold meloxicam tomorrow am and we have you scheduled for toradol 60 mg im tomorrow am. Then resume meloxicam on Friday morning. Try back stretching exercises.    Work not sent to my chart.  Follow up in 7 days or as needed

## 2019-03-09 NOTE — Progress Notes (Signed)
Subjective:    Patient ID: [name], female    DOB: [birth year], 54 y.o.   MRN: [id number]  HPI  Pt has had recent flare of her back pain since Sunday. She has lower back pain with recent pain in bilateral si area but  pain radiating to back of her left  leg. She states pain hurts regardless of standing or sitting.  Pt is scheduled to work tomorrow. She missed Monday and Tuesday due to pain. She request to work on Monday. She has this pain despite regimen of oxy 5 mg twice a day, meloxicam 7.5 twice a day and has zanaflex rx.  Pt a1c level 8 around 3 months ago.  On review no red flag signs or symptoms noted.    Review of Systems  Constitutional: Negative for chills, fatigue and fever.  Respiratory: Negative for cough, chest tightness, shortness of breath and wheezing.   Cardiovascular: Negative for chest pain and palpitations.  Gastrointestinal: Negative for abdominal pain.  Genitourinary: Negative for difficulty urinating and flank pain.  Musculoskeletal: Positive for back pain. Negative for neck pain.       Sciatica features.  Skin: Negative for rash.  Neurological: Negative for dizziness, speech difficulty, weakness and headaches.  Hematological: Negative for adenopathy. Does not bruise/bleed easily.  Psychiatric/Behavioral: Negative for behavioral problems and confusion.    Past Medical History:  Diagnosis Date  . Cervical cancer ([place])    had surgery in 2001.  . Coronary artery disease    30% lesions noted  . Depression   . Diabetes mellitus   . GERD (gastroesophageal reflux disease)   . Hyperlipidemia   . Hypertension   . Neuropathy      Social History   Socioeconomic History  . Marital status: Single    Spouse name: Not on file  . Number of children: Not on file  . Years of education: Not on file  . Highest education level: Not on file  Occupational History  . Not on file  Tobacco Use  . Smoking status: Never Smoker  . Smokeless tobacco:  Never Used  Substance and Sexual Activity  . Alcohol use: Yes    Comment: occ  . Drug use: No  . Sexual activity: Not on file  Other Topics Concern  . Not on file  Social History Narrative  . Not on file   Social Determinants of Health   Financial Resource Strain:   . Difficulty of Paying Living Expenses: Not on file  Food Insecurity:   . Worried About Charity fundraiser in the Last Year: Not on file  . Ran Out of Food in the Last Year: Not on file  Transportation Needs:   . Lack of Transportation (Medical): Not on file  . Lack of Transportation (Non-Medical): Not on file  Physical Activity:   . Days of Exercise per Week: Not on file  . Minutes of Exercise per Session: Not on file  Stress:   . Feeling of Stress : Not on file  Social Connections:   . Frequency of Communication with Friends and Family: Not on file  . Frequency of Social Gatherings with Friends and Family: Not on file  . Attends Religious Services: Not on file  . Active Member of Clubs or Organizations: Not on file  . Attends Archivist Meetings: Not on file  . Marital Status: Not on file  Intimate Partner Violence:   . Fear of Current or Ex-Partner: Not on file  .  Emotionally Abused: Not on file  . Physically Abused: Not on file  . Sexually Abused: Not on file    Past Surgical History:  Procedure Laterality Date  . ABDOMINAL HYSTERECTOMY     partial  . CHOLECYSTECTOMY      Family History  Problem Relation Age of Onset  . Diabetes Mother   . Diabetes Father     Allergies  Allergen Reactions  . Insulin Aspart Other (See Comments) and Swelling    adverse reaction to Novolog   . Latex Itching and Rash  . Morphine Itching and Rash    Current Outpatient Medications on File Prior to Visit  Medication Sig Dispense Refill  . Albuterol Sulfate (PROAIR RESPICLICK) 123XX123 (90 Base) MCG/ACT AEPB Inhale 2 puffs into the lungs at bedtime as needed for shortness of breath.    Marland Kitchen amLODipine  (NORVASC) 5 MG tablet Take 1 tablet (5 mg total) by mouth daily. 90 tablet 0  . aspirin 81 MG tablet Take 81 mg by mouth daily.    Marland Kitchen atorvastatin (LIPITOR) 80 MG tablet Take 1 tablet (80 mg total) by mouth daily. 90 tablet 1  . busPIRone (BUSPAR) 15 MG tablet Take 1 tablet (15 mg total) by mouth 2 (two) times daily. 60 tablet 0  . carvedilol (COREG) 25 MG tablet Take 1 tablet (25 mg total) by mouth 2 (two) times daily with a meal. 180 tablet 0  . Cholecalciferol (VITAMIN D3) 400 units CAPS Take 400 mg by mouth daily.    . dapagliflozin propanediol (FARXIGA) 10 MG TABS tablet Take 10 mg by mouth daily. 90 tablet 0  . doxepin (SINEQUAN) 25 MG capsule Take 25 mg by mouth at bedtime.    . famciclovir (FAMVIR) 500 MG tablet Take 1 tablet (500 mg total) by mouth 3 (three) times daily. 21 tablet 0  . fluticasone (FLONASE) 50 MCG/ACT nasal spray Place 2 sprays into both nostrils daily. 16 g 1  . fluticasone furoate-vilanterol (BREO ELLIPTA) 100-25 MCG/INH AEPB Inhale 1 puff into the lungs daily.    Marland Kitchen gabapentin (NEURONTIN) 300 MG capsule Take 300 mg by mouth 4 (four) times daily as needed for pain.    . hydrochlorothiazide (MICROZIDE) 12.5 MG capsule TAKE 1 CAPSULE BY MOUTH ONCE DAILY FOR 3 DAYS 3 capsule 0  . hydrOXYzine (ATARAX/VISTARIL) 10 MG tablet 1-2 tab p q 8 hours 30 tablet 0  . insulin degludec (TRESIBA FLEXTOUCH) 100 UNIT/ML SOPN FlexTouch Pen Inject 40 Units into the skin daily at 10 pm.    . meloxicam (MOBIC) 7.5 MG tablet 1-2 tab po q day 30 tablet 0  . metFORMIN (GLUCOPHAGE-XR) 500 MG 24 hr tablet 2 (two) times daily.    . metoCLOPramide (REGLAN) 10 MG tablet Take 1 tablet (10 mg total) by mouth every 8 (eight) hours as needed for nausea. 20 tablet 0  . neomycin-polymyxin-hydrocortisone (CORTISPORIN) OTIC solution Place 3 drops into the left ear 4 (four) times daily. 10 mL 0  . nitroGLYCERIN (NITROSTAT) 0.4 MG SL tablet Place 1 tablet (0.4 mg total) under the tongue every 5 (five) minutes as  needed. 25 tablet 3  . omeprazole (PRILOSEC) 20 MG capsule Take 1 capsule (20 mg total) by mouth daily. 30 capsule 3  . oxyCODONE (OXY IR/ROXICODONE) 5 MG immediate release tablet 1 tab po twice daily as needed for severe pain 45 tablet 0  . polyethylene glycol (MIRALAX / GLYCOLAX) packet Take 17 g by mouth daily. 14 each 0  . promethazine (PHENERGAN) 12.5 MG  tablet TAKE 1 TABLET BY MOUTH EVERY 8 HOURS AS NEEDED FOR  NAUSEA  OR  VOMITING 20 tablet 0  . ranitidine (ZANTAC) 150 MG capsule Take 1 capsule (150 mg total) by mouth 2 (two) times daily. 60 capsule 3  . senna-docusate (SENOKOT-S) 8.6-50 MG tablet Take 2 tablets by mouth 2 (two) times daily. 60 tablet 1  . spironolactone (ALDACTONE) 25 MG tablet Take 1 tablet (25 mg total) by mouth daily. 90 tablet 0  . sucralfate (CARAFATE) 1 g tablet Take 1 tablet (1 g total) by mouth 4 (four) times daily -  with meals and at bedtime. 120 tablet 0  . tiZANidine (ZANAFLEX) 4 MG capsule TAKE 1 CAPSULE BY MOUTH EVERY DAY AT BEDTIME AS NEEDED FOR MUSCLE SPASMS/CRAMPS 20 capsule 0  . tizanidine (ZANAFLEX) 6 MG capsule Take 1 capsule (6 mg total) by mouth 3 (three) times daily as needed for muscle spasms. 21 capsule 0   No current facility-administered medications on file prior to visit.    Ht 6' (1.829 m)   Wt 177 lb (80.3 kg)   BMI 24.01 kg/m       Objective:   Physical Exam  General-no acute distress, pleasant, oriented. Lungs- on inspection lungs appear unlabored. Neck- no tracheal deviation or jvd on inspection. Neuro- gross motor function appears intact. Back- pain on movement per pt. Most of pain in left si area. But does at times run to back of leg toward level of knee.      Assessment & Plan:  You have flare of your back pain with sciatica feature despite your current regiment of 3 meds. I think at this point consecutive days off from work would be beneficial. Try to add salon pas lidocaine patch to left si area. Continue oxycodone and  muscle relaxant. Hold meloxicam tomorrow am and we have you scheduled for toradol 60 mg im tomorrow am. Then resume meloxicam on Friday morning. Try back stretching exercises.    Work not sent to my chart.  Follow up in 7 days or as needed  20 minutes spent with pt.  Mackie Pai, PA-C

## 2019-03-10 ENCOUNTER — Other Ambulatory Visit: Payer: Self-pay

## 2019-03-10 ENCOUNTER — Ambulatory Visit: Payer: 59

## 2019-03-14 ENCOUNTER — Ambulatory Visit: Payer: 59 | Attending: Internal Medicine

## 2019-03-14 DIAGNOSIS — Z20822 Contact with and (suspected) exposure to covid-19: Secondary | ICD-10-CM

## 2019-03-15 LAB — NOVEL CORONAVIRUS, NAA: SARS-CoV-2, NAA: NOT DETECTED

## 2019-03-18 ENCOUNTER — Other Ambulatory Visit: Payer: Self-pay

## 2019-03-18 ENCOUNTER — Ambulatory Visit (INDEPENDENT_AMBULATORY_CARE_PROVIDER_SITE_OTHER): Payer: 59

## 2019-03-18 ENCOUNTER — Telehealth: Payer: Self-pay | Admitting: Medical

## 2019-03-18 DIAGNOSIS — M545 Low back pain, unspecified: Secondary | ICD-10-CM

## 2019-03-18 DIAGNOSIS — Z111 Encounter for screening for respiratory tuberculosis: Secondary | ICD-10-CM

## 2019-03-18 MED ORDER — KETOROLAC TROMETHAMINE 60 MG/2ML IM SOLN
60.0000 mg | Freq: Once | INTRAMUSCULAR | Status: AC
  Administered 2019-03-18: 09:00:00 60 mg via INTRAMUSCULAR

## 2019-03-18 NOTE — Telephone Encounter (Signed)
Future cxr screening for tb/employment.

## 2019-03-18 NOTE — Progress Notes (Addendum)
Patient in for Toradol injection per PCP.   Ketorolac 60mg  administered IM in right upper outer quadrant.  Patient tolerated well.   Patient also requesting antibiotic for sinus infection, CXR for TB screening (new job, cannot have skin test) and a work excuse letter releasing her to return to work.   Gerilyn Nestle, RN  Future chest xray placed. Can you get more information about sinus symptoms. Frontal or maxillary pain. Any teeth sensitivity or discharge from nose. Any fever?  Might be able to rx antibiotic with more information.  Thanks.  Mackie Pai, PA-C

## 2019-03-18 NOTE — Telephone Encounter (Signed)
Will you ask pt more about her sinus symptoms. How many days. Frontal or maxillary pain. Any teeth pain? Any fever, any chills and any drainage from nose. How many days of symptoms.  Might rx antibiotic with more inormation.  Thanks,

## 2019-03-21 MED ORDER — AMOXICILLIN-POT CLAVULANATE 875-125 MG PO TABS: 1.0000 | ORAL_TABLET | Freq: Two times a day (BID) | ORAL | 0 refills | Status: DC

## 2019-03-21 NOTE — Telephone Encounter (Signed)
I sent in augmentin for possible sinus infection. Her sinus symptoms present problem as I can't put her back to work if possible covid. From back pain perspective if better can work but knowing she has sinus infection symptoms she should be aware possible covid. I would ask her to go ahead and get tested for covid start antibiotic and I need to have phone visit with her on Thursday or Friday when she get covid test back. Quarantine after covid test.  Sorry but this would be way to go about return to work in light of pandemic and varied range of symptoms possible with covid.

## 2019-03-21 NOTE — Addendum Note (Signed)
Addended by: Anabel Halon on: 03/21/2019 10:36 PM   Modules accepted: Orders

## 2019-03-21 NOTE — Telephone Encounter (Signed)
Spoke with patient regarding symptoms.  Patient reports left sided sinus pressure. Discomfort around left eye, upper teeth and left ear. Reports chills and increase nasal drainage (brown/green in color). Denies fever.   Advised of CXR order.   Also requesting letter to return to work. Original letter from 03/09/2019, states she could return on 03/14/2019, has not returned yet.

## 2019-03-22 NOTE — Telephone Encounter (Signed)
Patient was tested on 03/14/2019 (negative). Would you like her to get tested again?

## 2019-03-22 NOTE — Telephone Encounter (Signed)
LM instructing patient to get tested again, provided number to text for appointment at [facility].  Advised to start Augmentin and call to schedule VV with PCP later this week.  Requested call back with questions.

## 2019-03-22 NOTE — Telephone Encounter (Signed)
I would say yes since a lot can happen in 8 days. She did not mention any sinus pressure on last visit with me.   So do recommend getting tested and well see how she resonds to treatment.  Can give [facility] location number to schedule.

## 2019-03-23 ENCOUNTER — Other Ambulatory Visit: Payer: Self-pay | Admitting: Medical

## 2019-03-24 NOTE — Telephone Encounter (Signed)
Rx meloxicam sent to pt pharmacy.

## 2019-03-29 ENCOUNTER — Telehealth: Payer: Self-pay | Admitting: *Deleted

## 2019-03-29 ENCOUNTER — Other Ambulatory Visit: Payer: Self-pay | Admitting: *Deleted

## 2019-03-29 MED ORDER — CARVEDILOL 25 MG PO TABS: 25.0000 mg | ORAL_TABLET | Freq: Two times a day (BID) | ORAL | 1 refills | Status: DC

## 2019-03-29 NOTE — Telephone Encounter (Signed)
[facility] [address] requesting refill for tizanidine

## 2019-03-30 MED ORDER — TIZANIDINE HCL 4 MG PO CAPS: ORAL_CAPSULE | ORAL | 0 refills | Status: DC

## 2019-03-30 NOTE — Telephone Encounter (Signed)
Rx zanaflex refilled.

## 2019-04-11 ENCOUNTER — Telehealth: Payer: Self-pay | Admitting: Medical

## 2019-04-11 NOTE — Telephone Encounter (Signed)
Medication:oxyCODONE (OXY IR/ROXICODONE) 5 MG immediate release tablet  Albuterol Sulfate (PROAIR RESPICLICK) 123XX123 (90 Base) MCG/ACT AEPB [id number]    fluticasone furoate-vilanterol (BREO ELLIPTA) 100-25 MCG/INH AEPB [id number]  Has the patient contacted their pharmacy? Yes.   (If no, request that the patient contact the pharmacy for the refill.) (If yes, when and what did the pharmacy advise?)  Preferred Pharmacy (with phone number or street name):  [place], [place] [address]  Phone:  [phone] Fax:  [phone]     Agent: Please be advised that RX refills may take up to 3 business days. We ask that you follow-up with your pharmacy.

## 2019-04-12 ENCOUNTER — Other Ambulatory Visit: Payer: Self-pay

## 2019-04-12 ENCOUNTER — Ambulatory Visit: Payer: 59 | Admitting: Cardiology

## 2019-04-12 MED ORDER — PROAIR RESPICLICK 108 (90 BASE) MCG/ACT IN AEPB: 2.0000 | INHALATION_SPRAY | Freq: Every evening | RESPIRATORY_TRACT | 2 refills | Status: DC | PRN

## 2019-04-12 MED ORDER — BREO ELLIPTA 100-25 MCG/INH IN AEPB: 1.0000 | INHALATION_SPRAY | Freq: Every day | RESPIRATORY_TRACT | 2 refills | Status: DC

## 2019-04-12 NOTE — Telephone Encounter (Addendum)
Requesting: oxycodone Contract:06/15/18 UDS:01/10/19 Last Visit:03/09/19 Next Visit:n/a Last Refill: n/a  Please Advise  Rx sent in to pharmacy. She needs to be scheduled to sign controlled med contract. Does not need appointment but needs contract update within next 2 months.

## 2019-04-13 MED ORDER — OXYCODONE HCL 5 MG PO TABS: ORAL_TABLET | ORAL | 0 refills | Status: DC

## 2019-04-13 NOTE — Telephone Encounter (Signed)
Pt.notified

## 2019-04-24 ENCOUNTER — Other Ambulatory Visit: Payer: Self-pay | Admitting: Medical

## 2019-04-25 ENCOUNTER — Other Ambulatory Visit: Payer: Self-pay | Admitting: Medical

## 2019-04-28 ENCOUNTER — Ambulatory Visit: Payer: 59 | Attending: Internal Medicine

## 2019-04-28 DIAGNOSIS — Z23 Encounter for immunization: Secondary | ICD-10-CM

## 2019-04-28 NOTE — Progress Notes (Signed)
   Covid-19 Vaccination Clinic  Name:  [name]    MRN: [id number] DOB: 1965/04/23  04/28/2019  [name] was observed post Covid-19 immunization for 15 minutes without incident. She was provided with Vaccine Information Sheet and instruction to access the V-Safe system.   [name] was instructed to call 911 with any severe reactions post vaccine: Marland Kitchen Difficulty breathing  . Swelling of face and throat  . A fast heartbeat  . A bad rash all over body  . Dizziness and weakness   Immunizations Administered    Name Date Dose VIS Date Route   Pfizer COVID-19 Vaccine 04/28/2019 12:50 PM 0.3 mL 01/21/2019 Intramuscular   Manufacturer: [place]   Lot: [id number]   [place]: [id number]

## 2019-05-07 ENCOUNTER — Other Ambulatory Visit: Payer: Self-pay | Admitting: Medical

## 2019-05-16 ENCOUNTER — Ambulatory Visit: Payer: 59 | Admitting: Cardiology

## 2019-05-23 ENCOUNTER — Ambulatory Visit: Payer: 59 | Attending: Internal Medicine

## 2019-05-23 DIAGNOSIS — Z23 Encounter for immunization: Secondary | ICD-10-CM

## 2019-05-23 NOTE — Progress Notes (Signed)
   Covid-19 Vaccination Clinic  Name:  [name]    MRN: [id number] DOB: 05-23-1965  05/23/2019  [name] was observed post Covid-19 immunization for 15 minutes without incident. She was provided with Vaccine Information Sheet and instruction to access the V-Safe system.   [name] was instructed to call 911 with any severe reactions post vaccine: Marland Kitchen Difficulty breathing  . Swelling of face and throat  . A fast heartbeat  . A bad rash all over body  . Dizziness and weakness   Immunizations Administered    Name Date Dose VIS Date Route   Pfizer COVID-19 Vaccine 05/23/2019 12:00 PM 0.3 mL 01/21/2019 Intramuscular   Manufacturer: [place]   Lot: [id number]   [place]: [facility]  Name:  [name]    MRN: [id number] DOB: 08/29/1965  05/23/2019  [name] was observed post Covid-19 immunization for 15 minutes without incident. She was provided with Vaccine Information Sheet and instruction to access the V-Safe system.   [name] was instructed to call 911 with any severe reactions post vaccine: Marland Kitchen Difficulty breathing  . Swelling of face and throat  . A fast heartbeat  . A bad rash all over body  . Dizziness and weakness   Immunizations Administered    Name Date Dose VIS Date Route   Pfizer COVID-19 Vaccine 05/23/2019 12:00 PM 0.3 mL 01/21/2019 Intramuscular   Manufacturer: [place]   Lot: [id number]   [place]: [id number]

## 2019-06-01 ENCOUNTER — Other Ambulatory Visit: Payer: Self-pay | Admitting: Medical

## 2019-06-03 ENCOUNTER — Emergency Department (HOSPITAL_BASED_OUTPATIENT_CLINIC_OR_DEPARTMENT_OTHER): Payer: 59

## 2019-06-03 ENCOUNTER — Encounter (HOSPITAL_BASED_OUTPATIENT_CLINIC_OR_DEPARTMENT_OTHER): Payer: Self-pay | Admitting: *Deleted

## 2019-06-03 ENCOUNTER — Other Ambulatory Visit: Payer: Self-pay

## 2019-06-03 ENCOUNTER — Emergency Department (HOSPITAL_BASED_OUTPATIENT_CLINIC_OR_DEPARTMENT_OTHER)
Admission: EM | Admit: 2019-06-03 | Discharge: 2019-06-03 | Disposition: A | Discharge status: Home / Self Care | Payer: 59 | Attending: Emergency Medicine | Admitting: Emergency Medicine

## 2019-06-03 DIAGNOSIS — Z7982 Long term (current) use of aspirin: Secondary | ICD-10-CM | POA: Insufficient documentation

## 2019-06-03 DIAGNOSIS — Z79899 Other long term (current) drug therapy: Secondary | ICD-10-CM | POA: Insufficient documentation

## 2019-06-03 DIAGNOSIS — E1051 Type 1 diabetes mellitus with diabetic peripheral angiopathy without gangrene: Secondary | ICD-10-CM | POA: Insufficient documentation

## 2019-06-03 DIAGNOSIS — I10 Essential (primary) hypertension: Secondary | ICD-10-CM | POA: Diagnosis not present

## 2019-06-03 DIAGNOSIS — Z794 Long term (current) use of insulin: Secondary | ICD-10-CM | POA: Diagnosis not present

## 2019-06-03 DIAGNOSIS — R079 Chest pain, unspecified: Secondary | ICD-10-CM

## 2019-06-03 DIAGNOSIS — E1043 Type 1 diabetes mellitus with diabetic autonomic (poly)neuropathy: Secondary | ICD-10-CM | POA: Insufficient documentation

## 2019-06-03 DIAGNOSIS — R0789 Other chest pain: Secondary | ICD-10-CM | POA: Insufficient documentation

## 2019-06-03 HISTORY — DX: Other chronic pain: G89.29

## 2019-06-03 LAB — CBC WITH DIFFERENTIAL/PLATELET
Abs Immature Granulocytes: 0.01 10*3/uL (ref 0.00–0.07)
Basophils Absolute: 0 10*3/uL (ref 0.0–0.1)
Basophils Relative: 0 %
Eosinophils Absolute: 0.3 10*3/uL (ref 0.0–0.5)
Eosinophils Relative: 5 %
HCT: 40.8 % (ref 36.0–46.0)
Hemoglobin: 13.5 g/dL (ref 12.0–15.0)
Immature Granulocytes: 0 %
Lymphocytes Relative: 36 %
Lymphs Abs: 2.5 10*3/uL (ref 0.7–4.0)
MCH: 30 pg (ref 26.0–34.0)
MCHC: 33.1 g/dL (ref 30.0–36.0)
MCV: 90.7 fL (ref 80.0–100.0)
Monocytes Absolute: 0.4 10*3/uL (ref 0.1–1.0)
Monocytes Relative: 6 %
Neutro Abs: 3.8 10*3/uL (ref 1.7–7.7)
Neutrophils Relative %: 53 %
Platelets: 285 10*3/uL (ref 150–400)
RBC: 4.5 MIL/uL (ref 3.87–5.11)
RDW: 14.9 % (ref 11.5–15.5)
WBC: 7.1 10*3/uL (ref 4.0–10.5)
nRBC: 0 % (ref 0.0–0.2)

## 2019-06-03 LAB — TROPONIN I (HIGH SENSITIVITY)
Troponin I (High Sensitivity): 4 ng/L (ref <18)
Troponin I (High Sensitivity): 5 ng/L (ref <18)

## 2019-06-03 LAB — COMPREHENSIVE METABOLIC PANEL
ALT: 31 U/L (ref 0–44)
AST: 34 U/L (ref 15–41)
Albumin: 4.6 g/dL (ref 3.5–5.0)
Alkaline Phosphatase: 95 U/L (ref 38–126)
Anion gap: 11 (ref 5–15)
BUN: 19 mg/dL (ref 6–20)
CO2: 24 mmol/L (ref 22–32)
Calcium: 9.5 mg/dL (ref 8.9–10.3)
Chloride: 100 mmol/L (ref 98–111)
Creatinine, Ser: 1.13 mg/dL — ABNORMAL HIGH (ref 0.44–1.00)
GFR calc Af Amer: >60 mL/min (ref >60)
GFR calc non Af Amer: 55 mL/min — ABNORMAL LOW (ref >60)
Glucose, Bld: 128 mg/dL — ABNORMAL HIGH (ref 70–99)
Potassium: 4.6 mmol/L (ref 3.5–5.1)
Sodium: 135 mmol/L (ref 135–145)
Total Bilirubin: 0.7 mg/dL (ref 0.3–1.2)
Total Protein: 8.7 g/dL — ABNORMAL HIGH (ref 6.5–8.1)

## 2019-06-03 LAB — D-DIMER, QUANTITATIVE: D-Dimer, Quant: 0.34 ug/mL-FEU (ref 0.00–0.50)

## 2019-06-03 MED ORDER — KETOROLAC TROMETHAMINE 15 MG/ML IJ SOLN
15.0000 mg | Freq: Once | INTRAMUSCULAR | Status: AC
  Administered 2019-06-03: 15 mg via INTRAVENOUS
  Filled 2019-06-03: qty 1

## 2019-06-03 MED ORDER — NAPROXEN 500 MG PO TABS
500.0000 mg | ORAL_TABLET | Freq: Two times a day (BID) | ORAL | 0 refills | Status: DC
Start: 2019-06-03 — End: 2019-11-16

## 2019-06-03 MED ORDER — METHOCARBAMOL 500 MG PO TABS
500.0000 mg | ORAL_TABLET | Freq: Two times a day (BID) | ORAL | 0 refills | Status: DC
Start: 2019-06-03 — End: 2019-06-13

## 2019-06-03 MED ORDER — FENTANYL CITRATE (PF) 100 MCG/2ML IJ SOLN: 50.0000 ug | Freq: Once | INTRAMUSCULAR | Status: DC

## 2019-06-03 MED ORDER — HYDROXYZINE HCL 25 MG PO TABS
25.0000 mg | ORAL_TABLET | Freq: Once | ORAL | Status: AC
  Administered 2019-06-03: 25 mg via ORAL
  Filled 2019-06-03: qty 1

## 2019-06-03 NOTE — Discharge Instructions (Signed)
Work-up today was very reassuring and does not suggest a problem with your heart or lungs.  This could be musculoskeletal,, and anxiety could be contributing as well.  Take prescribed anti-inflammatories, you can use muscle relaxers as needed as well.  Follow-up closely with your primary care doctor and your heart doctor.

## 2019-06-03 NOTE — ED Triage Notes (Signed)
Pt c/o mid sternal chest pain x " mins " , denies n/v

## 2019-06-03 NOTE — ED Provider Notes (Signed)
[facility] EMERGENCY DEPARTMENT Provider Note   CSN: [id number] Arrival date & time: 06/03/19  1805     History Chief Complaint  Patient presents with  . Chest Pain    [name] is a 54 y.o. female.  [name] is a 53 y.o. female with history of hypertension, hyperlipidemia, diabetes, GERD, anxiety, who presents with chest pain on the right side of her chest that began suddenly a few hours prior to arrival when she was cleaning a house.  Pain is right-sided with some radiation into her back.  No ripping or tearing sensation.  States that she has had some pain in her right arm, noticed a bruise to her forearm, is not sure how she got it but feels like she probably hit it on something while cleaning house.  Denies any numbness tingling or weakness.  Does report that when pain gets bad she feels a little short of breath.  When pain initially started she sat down and it seemed to improve but did not go away.  She has not taken any medicine to treat this pain.  Feels like it is worse with certain movements.  No abdominal pain, no vomiting, or diaphoresis.  Has had some similar episodes of pain, but had reassuring evaluation with cardiology, had a Myoview perfusion study done in January which was reassuring.  Felt like this may be related to anxiety previously.  Patient does state that she has been under increasing stress and has a funeral to attend.        Past Medical History:  Diagnosis Date  . Cervical cancer ([place])    had surgery in 2001.  Marland Kitchen Chronic back pain   . Coronary artery disease    30% lesions noted  . Depression   . Diabetes mellitus   . GERD (gastroesophageal reflux disease)   . Hyperlipidemia   . Hypertension   . Neuropathy     Patient Active Problem List   Diagnosis Date Noted  . Mixed dyslipidemia 02/01/2019  . Cardiac murmur 02/01/2019  . Neuropathy 07/20/2017  . Right flank pain   . Nausea & vomiting 07/07/2017  . Acute cystitis  07/07/2017  . CAD (coronary artery disease) 07/07/2017  . Metatarsalgia of both feet 03/11/2017  . Diabetic gastroparesis associated with type 1 diabetes mellitus ([place]) 11/27/2016  . Pure hypercholesterolemia 11/27/2016  . Lumbar facet arthropathy 05/14/2016  . Chronic pain syndrome 01/23/2016  . Diabetic peripheral neuropathy ([place]) 01/23/2016  . Anxiety 01/22/2016  . History of cervical cancer 08/17/2015  . Depression 08/10/2015  . Overweight (BMI 25.0-29.9) 07/21/2014  . Chronic kidney disease, stage III (moderate) 05/23/2014  . Diabetes type 2, uncontrolled ([employer]) 05/23/2014  . Current use of insulin ([place]) 05/23/2014  . Vitamin D deficiency 04/25/2013  . Cerebrovascular disease 09/13/2012  . Migraine 09/13/2012  . Nonspecific abnormal findings on radiological and examination of skull and head 09/13/2012  . Retention of urine 06/25/2011  . ANKLE PAIN, LEFT 12/25/2009  . DYSURIA 12/25/2009  . VAGINITIS, CANDIDAL 12/25/2009  . ULCER-GASTRIC 09/28/2009  . DIABETIC PERIPHERAL NEUROPATHY 09/21/2009  . TOBACCO ABUSE 09/21/2009  . URINARY TRACT INFECTION 09/21/2009  . INSOMNIA 09/21/2009  . TRANSAMINASES, SERUM, ELEVATED 09/21/2009  . Type 2 diabetes mellitus with stage 3 chronic kidney disease ([place]) 09/14/2009  . NAUSEA AND VOMITING 09/14/2009  . ABDOMINAL PAIN-EPIGASTRIC 09/14/2009  . CARDIOMYOPATHY, SECONDARY 09/06/2009  . Essential hypertension 08/30/2009  . DIABETES MELLITUS, TYPE II, UNCONTROLLED 07/30/2009  . HLD (hyperlipidemia) 07/30/2009  .  GERD 07/30/2009  . GASTROPARESIS 07/30/2009  . CHEST PAIN, ATYPICAL 07/30/2009  . POSITIVE PPD 07/30/2009    Past Surgical History:  Procedure Laterality Date  . ABDOMINAL HYSTERECTOMY     partial  . CHOLECYSTECTOMY       OB History   No obstetric history on file.     Family History  Problem Relation Age of Onset  . Diabetes Mother   . Diabetes Father     Social History   Tobacco Use  . Smoking status: Never  Smoker  . Smokeless tobacco: Never Used  Substance Use Topics  . Alcohol use: Yes    Comment: occ  . Drug use: No    Home Medications Prior to Admission medications   Medication Sig Start Date End Date Taking? Authorizing Provider  Albuterol Sulfate (PROAIR RESPICLICK) 123XX123 (90 Base) MCG/ACT AEPB Inhale 2 puffs into the lungs at bedtime as needed. 04/12/19   Saguier, Percell Miller, PA-C  amLODipine (NORVASC) 5 MG tablet Take 1 tablet (5 mg total) by mouth daily. 06/01/19   Saguier, Percell Miller, PA-C  aspirin 81 MG tablet Take 81 mg by mouth daily.    [provider]  atorvastatin (LIPITOR) 80 MG tablet Take 1 tablet (80 mg total) by mouth daily. 11/10/18   Saguier, Percell Miller, PA-C  busPIRone (BUSPAR) 15 MG tablet Take 1 tablet by mouth twice daily 04/26/19   Saguier, Percell Miller, PA-C  carvedilol (COREG) 25 MG tablet Take 1 tablet (25 mg total) by mouth 2 (two) times daily with a meal. 03/29/19   Saguier, Percell Miller, PA-C  Cholecalciferol (VITAMIN D3) 400 units CAPS Take 400 mg by mouth daily.    [provider]  dapagliflozin propanediol (FARXIGA) 10 MG TABS tablet Take 10 mg by mouth daily. 01/10/19   Saguier, Percell Miller, PA-C  doxepin (SINEQUAN) 25 MG capsule Take 25 mg by mouth at bedtime. 01/17/19   [provider]  famciclovir (FAMVIR) 500 MG tablet Take 1 tablet (500 mg total) by mouth 3 (three) times daily. 09/10/17   Saguier, Percell Miller, PA-C  fluticasone (FLONASE) 50 MCG/ACT nasal spray Place 2 sprays into both nostrils daily. 12/08/18   Saguier, Percell Miller, PA-C  fluticasone furoate-vilanterol (BREO ELLIPTA) 100-25 MCG/INH AEPB Inhale 1 puff into the lungs daily. 04/12/19   Saguier, Percell Miller, PA-C  gabapentin (NEURONTIN) 300 MG capsule Take 300 mg by mouth 4 (four) times daily as needed for pain. 12/19/15   [provider]  hydrochlorothiazide (MICROZIDE) 12.5 MG capsule TAKE 1 CAPSULE BY MOUTH ONCE DAILY FOR 3 DAYS 12/28/18   Saguier, Percell Miller, PA-C  hydrOXYzine (ATARAX/VISTARIL) 10 MG tablet 1-2  tab p q 8 hours 12/29/18   Saguier, Percell Miller, PA-C  insulin degludec (TRESIBA FLEXTOUCH) 100 UNIT/ML SOPN FlexTouch Pen Inject 40 Units into the skin daily at 10 pm.    [provider]  meloxicam (MOBIC) 7.5 MG tablet TAKE 1 TO 2 TABLETS BY MOUTH ONCE DAILY 05/09/19   Saguier, Percell Miller, PA-C  metFORMIN (GLUCOPHAGE-XR) 500 MG 24 hr tablet 2 (two) times daily. 01/03/19   [provider]  methocarbamol (ROBAXIN) 500 MG tablet Take 1 tablet (500 mg total) by mouth 2 (two) times daily. 06/03/19   Jacqlyn Larsen, PA-C  metoCLOPramide (REGLAN) 10 MG tablet Take 1 tablet (10 mg total) by mouth every 8 (eight) hours as needed for nausea. 07/11/17   Doreatha Lew, MD  naproxen (NAPROSYN) 500 MG tablet Take 1 tablet (500 mg total) by mouth 2 (two) times daily. 06/03/19   Jacqlyn Larsen,  PA-C  neomycin-polymyxin-hydrocortisone (CORTISPORIN) OTIC solution Place 3 drops into the left ear 4 (four) times daily. 12/29/18   Saguier, Percell Miller, PA-C  nitroGLYCERIN (NITROSTAT) 0.4 MG SL tablet Place 1 tablet (0.4 mg total) under the tongue every 5 (five) minutes as needed. 02/01/19 05/02/19  Revankar, Reita Cliche, MD  omeprazole (PRILOSEC) 20 MG capsule Take 1 capsule (20 mg total) by mouth daily. 11/10/18   Saguier, Percell Miller, PA-C  oxyCODONE (OXY IR/ROXICODONE) 5 MG immediate release tablet 1 tab po twice daily as needed for severe pain 04/13/19   Saguier, Percell Miller, PA-C  polyethylene glycol (MIRALAX / GLYCOLAX) packet Take 17 g by mouth daily. 07/12/17   Patrecia Pour, Christean Grief, MD  promethazine (PHENERGAN) 12.5 MG tablet TAKE 1 TABLET BY MOUTH EVERY 8 HOURS AS NEEDED FOR  NAUSEA  OR  VOMITING 12/08/18   Saguier, Percell Miller, PA-C  ranitidine (ZANTAC) 150 MG capsule Take 1 capsule (150 mg total) by mouth 2 (two) times daily. 10/15/17   Saguier, Percell Miller, PA-C  senna-docusate (SENOKOT-S) 8.6-50 MG tablet Take 2 tablets by mouth 2 (two) times daily. 07/11/17   Doreatha Lew, MD  spironolactone (ALDACTONE) 25 MG tablet Take 1 tablet  (25 mg total) by mouth daily. 02/25/19   Saguier, Percell Miller, PA-C  sucralfate (CARAFATE) 1 g tablet Take 1 tablet (1 g total) by mouth 4 (four) times daily -  with meals and at bedtime. 07/20/17   Saguier, Percell Miller, PA-C  tiZANidine (ZANAFLEX) 4 MG capsule TAKE 1 CAPSULE BY MOUTH EVERY DAY AT BEDTIME AS NEEDED FOR MUSCLE SPASMS/CRAMPS 03/30/19   Saguier, Percell Miller, PA-C  tizanidine (ZANAFLEX) 6 MG capsule Take 1 capsule (6 mg total) by mouth 3 (three) times daily as needed for muscle spasms. 12/28/18   Saguier, Percell Miller, PA-C    Allergies    Insulin aspart, Latex, and Morphine  Review of Systems   Review of Systems  Constitutional: Negative for chills and fever.  HENT: Negative.   Respiratory: Positive for shortness of breath. Negative for cough.   Cardiovascular: Positive for chest pain. Negative for palpitations and leg swelling.  Gastrointestinal: Negative for abdominal pain, nausea and vomiting.  Genitourinary: Negative for dysuria and frequency.  Musculoskeletal: Positive for myalgias. Negative for arthralgias.  Skin: Negative for color change and rash.  Neurological: Negative for syncope, weakness, light-headedness, numbness and headaches.  All other systems reviewed and are negative.   Physical Exam Updated Vital Signs BP (!) 156/113 (BP Location: Left Arm)   Pulse 86   Temp 98 F (36.7 C) (Oral)   Resp 18   Ht 6' (1.829 m)   Wt 81.6 kg   SpO2 99%   BMI 24.41 kg/m   Physical Exam Vitals and nursing note reviewed.  Constitutional:      General: She is not in acute distress.    Appearance: She is well-developed. She is not ill-appearing or diaphoretic.     Comments: Appears anxious, but is in no acute distress  HENT:     Head: Normocephalic and atraumatic.  Eyes:     General:        Right eye: No discharge.        Left eye: No discharge.     Pupils: Pupils are equal, round, and reactive to light.  Cardiovascular:     Rate and Rhythm: Normal rate and regular rhythm.      Pulses:          Radial pulses are 2+ on the right side and 2+ on the left side.  Dorsalis pedis pulses are 2+ on the right side and 2+ on the left side.     Heart sounds: Normal heart sounds. No murmur. No friction rub. No gallop.   Pulmonary:     Effort: Pulmonary effort is normal. No respiratory distress.     Breath sounds: Normal breath sounds. No wheezing or rales.     Comments: Respirations equal and unlabored, patient able to speak in full sentences, lungs clear to auscultation bilaterally Chest:     Chest wall: Tenderness present.     Comments: Chest pain reproducible with palpation over the chest, no deformity or overlying skin changes Abdominal:     General: Bowel sounds are normal. There is no distension.     Palpations: Abdomen is soft. There is no mass.     Tenderness: There is no abdominal tenderness. There is no guarding.  Musculoskeletal:        General: No deformity.     Cervical back: Neck supple.     Right lower leg: No tenderness.     Left lower leg: No tenderness.  Skin:    General: Skin is warm and dry.     Capillary Refill: Capillary refill takes less than 2 seconds.  Neurological:     Mental Status: She is alert.     Coordination: Coordination normal.     Comments: Speech is clear, able to follow commands Moves extremities without ataxia, coordination intact  Psychiatric:        Mood and Affect: Mood is anxious.        Behavior: Behavior normal.     ED Results / Procedures / Treatments   Labs (all labs ordered are listed, but only abnormal results are displayed) Labs Reviewed  COMPREHENSIVE METABOLIC PANEL - Abnormal; Notable for the following components:      Result Value   Glucose, Bld 128 (*)    Creatinine, Ser 1.13 (*)    Total Protein 8.7 (*)    GFR calc non Af Amer 55 (*)    All other components within normal limits  CBC WITH DIFFERENTIAL/PLATELET  D-DIMER, QUANTITATIVE (NOT AT [facility])  TROPONIN I (HIGH SENSITIVITY)  TROPONIN I (HIGH  SENSITIVITY)    EKG EKG Interpretation  Date/Time:  Friday June 03 2019 18:08:26 EDT Ventricular Rate:  85 PR Interval:  156 QRS Duration: 72 QT Interval:  366 QTC Calculation: 435 R Axis:   49 Text Interpretation: Normal sinus rhythm Normal ECG No significant change since last tracing Confirmed by Blanchie Dessert [phone]) on 06/03/2019 7:25:21 PM   Radiology DG Chest 2 View  Result Date: 06/03/2019 CLINICAL DATA:  Chest pain, forearm pain EXAM: CHEST - 2 VIEW COMPARISON:  Radiograph 07/31/2017 FINDINGS: No consolidation, features of edema, pneumothorax, or effusion. Pulmonary vascularity is normally distributed. The cardiomediastinal contours are unremarkable. No acute osseous or soft tissue abnormality. IMPRESSION: No acute cardiopulmonary abnormality. Electronically Signed   By: Lovena Le M.D.   On: 06/03/2019 18:53    Procedures Procedures (including critical care time)  Medications Ordered in ED Medications  ketorolac (TORADOL) 15 MG/ML injection 15 mg (15 mg Intravenous Given 06/03/19 2027)  hydrOXYzine (ATARAX/VISTARIL) tablet 25 mg (25 mg Oral Given 06/03/19 2027)    ED Course  I have reviewed the triage vital signs and the nursing notes.  Pertinent labs & imaging results that were available during my care of the patient were reviewed by me and considered in my medical decision making (see chart for details).    MDM Rules/Calculators/A&P  Patient presents to the emergency department with chest pain. Patient nontoxic appearing, in no apparent distress, vitals without significant abnormality. Fairly benign physical exam.   Prior heart catheterization reviewed: none, but myoview scan in 02/2019 was normal Patient's primary cardiologist: Dr. Geraldo Pitter  DDX: ACS, pulmonary embolism, dissection, pneumothorax, pneumonia, arrhythmia, severe anemia, MSK, GERD, anxiety. Evaluation initiated with labs, EKG, and CXR. Patient on cardiac monitor.   CBC: No  leukocytosis, normal hemoglobin BMP: Slight bump in creatinine, glucose of 128, no other significant electrolyte derangements Troponin: Negative x2 D-dimer: not elevated so doubt PE EKG: Normal sinus rhythm with no concerning ischemic changes CXR: Negative, without infiltrate, effusion, pneumothorax, or fracture/dislocation.   [place] Pathway Score 3 - EKG without obvious acute ischemia, delta troponin negative, doubt ACS. D-dimer neg, doubt pulmonary embolism. Pain is not a tearing sensation, symmetric pulses, no widening of mediastinum on CXR, doubt dissection. Cardiac monitor reviewed, no notable arrhythmias or tachycardia. Patient has appeared hemodynamically stable throughout ER visit and appears safe for discharge with close PCP/cardiology follow up. I discussed results, treatment plan, need for PCP follow-up, and return precautions with the patient. Provided opportunity for questions, patient confirmed understanding and is in agreement with plan.   Final Clinical Impression(s) / ED Diagnoses Final diagnoses:  Right-sided chest pain    Rx / DC Orders ED Discharge Orders         Ordered    naproxen (NAPROSYN) 500 MG tablet  2 times daily     06/03/19 2303    methocarbamol (ROBAXIN) 500 MG tablet  2 times daily     06/03/19 2303           Jacqlyn Larsen, [place] 06/07/19 [id number]    Lucrezia Starch, MD 06/08/19 [phone]

## 2019-06-06 ENCOUNTER — Telehealth (INDEPENDENT_AMBULATORY_CARE_PROVIDER_SITE_OTHER): Payer: 59 | Admitting: Medical

## 2019-06-06 ENCOUNTER — Other Ambulatory Visit: Payer: Self-pay

## 2019-06-06 DIAGNOSIS — F419 Anxiety disorder, unspecified: Secondary | ICD-10-CM | POA: Diagnosis not present

## 2019-06-06 DIAGNOSIS — S29011A Strain of muscle and tendon of front wall of thorax, initial encounter: Secondary | ICD-10-CM

## 2019-06-06 MED ORDER — METHOCARBAMOL 750 MG PO TABS
750.0000 mg | ORAL_TABLET | Freq: Three times a day (TID) | ORAL | 0 refills | Status: DC | PRN
Start: 2019-06-06 — End: 2019-07-24

## 2019-06-06 MED ORDER — BUSPIRONE HCL 15 MG PO TABS: 15.0000 mg | ORAL_TABLET | Freq: Three times a day (TID) | ORAL | 0 refills | Status: DC

## 2019-06-06 NOTE — Progress Notes (Addendum)
Subjective:    Patient ID: [name], female    DOB: [birth year], 54 y.o.   MRN: [id number]  HPI   Virtual Visit via Video Note  I connected with [name] on 06/10/19 at  4:00 PM EDT by a video enabled telemedicine application and verified that I am speaking with the correct person using two identifiers.  Location: Patient: home Provider: office   I discussed the limitations of evaluation and management by telemedicine and the availability of in person appointments. The patient expressed understanding and agreed to proceed.     Follow Up Instructions:    I discussed the assessment and treatment plan with the patient. The patient was provided an opportunity to ask questions and all were answered. The patient agreed with the plan and demonstrated an understanding of the instructions.   The patient was advised to call back or seek an in-person evaluation if the symptoms worsen or if the condition fails to improve as anticipated.     Mackie Pai, PA-C  Pt in for follow up.  Pt has some rt side chest pain. I am seeing her past 4 pm. Pt  Was worked up by ED  and told likely not cardiac pain. Pt has nephew recently passed away. Pt states she has been stressed. She had rt side chest pain at the time of funeral and driving there. The day went to ED had rt sie chest pain.  Pt has been on buspar in the past for anxiety. She states was anxiety due to death of nephew.   I am looking at ED note and it says incomplete.  Pt was given naprosyn for pain, toradol injection and muscle relaxer robaxin. She has meloxicam rx.   Pt had negative d dimer, negative troponin and negative chest xray.  Pt states she feels better in terms of chest pain but still stressed.  Pt feels like needs some rest as she works 2 jobs. About 14-16 hours about 6 days.   Pain has mostly been rt side chest.   Pt did not check bp today and pulse.   Review of Systems  Constitutional:  Negative for chills, fatigue and fever.  Respiratory: Negative for cough, chest tightness, shortness of breath and wheezing.   Cardiovascular: Negative for chest pain and palpitations.  Gastrointestinal: Negative for abdominal pain.  Musculoskeletal:       See hpi.  Skin: Negative for rash.  Neurological: Negative for dizziness, seizures, speech difficulty, weakness and light-headedness.  Hematological: Negative for adenopathy. Does not bruise/bleed easily.  Psychiatric/Behavioral: Negative for behavioral problems, confusion and sleep disturbance. The patient is nervous/anxious.     Past Medical History:  Diagnosis Date  . Cervical cancer ([place])    had surgery in 2001.  Marland Kitchen Chronic back pain   . Coronary artery disease    30% lesions noted  . Depression   . Diabetes mellitus   . GERD (gastroesophageal reflux disease)   . Hyperlipidemia   . Hypertension   . Neuropathy      Social History   Socioeconomic History  . Marital status: Single    Spouse name: Not on file  . Number of children: Not on file  . Years of education: Not on file  . Highest education level: Not on file  Occupational History  . Not on file  Tobacco Use  . Smoking status: Never Smoker  . Smokeless tobacco: Never Used  Substance and Sexual Activity  . Alcohol use: Yes  Comment: occ  . Drug use: No  . Sexual activity: Not on file  Other Topics Concern  . Not on file  Social History Narrative  . Not on file   Social Determinants of Health   Financial Resource Strain:   . Difficulty of Paying Living Expenses:   Food Insecurity:   . Worried About Charity fundraiser in the Last Year:   . Arboriculturist in the Last Year:   Transportation Needs:   . Film/video editor (Medical):   Marland Kitchen Lack of Transportation (Non-Medical):   Physical Activity:   . Days of Exercise per Week:   . Minutes of Exercise per Session:   Stress:   . Feeling of Stress :   Social Connections:   . Frequency of  Communication with Friends and Family:   . Frequency of Social Gatherings with Friends and Family:   . Attends Religious Services:   . Active Member of Clubs or Organizations:   . Attends Archivist Meetings:   Marland Kitchen Marital Status:   Intimate Partner Violence:   . Fear of Current or Ex-Partner:   . Emotionally Abused:   Marland Kitchen Physically Abused:   . Sexually Abused:     Past Surgical History:  Procedure Laterality Date  . ABDOMINAL HYSTERECTOMY     partial  . CHOLECYSTECTOMY      Family History  Problem Relation Age of Onset  . Diabetes Mother   . Diabetes Father     Allergies  Allergen Reactions  . Insulin Aspart Other (See Comments) and Swelling    adverse reaction to Novolog   . Latex Itching and Rash  . Morphine Itching and Rash    Current Outpatient Medications on File Prior to Visit  Medication Sig Dispense Refill  . Albuterol Sulfate (PROAIR RESPICLICK) 123XX123 (90 Base) MCG/ACT AEPB Inhale 2 puffs into the lungs at bedtime as needed. 90 each 2  . amLODipine (NORVASC) 5 MG tablet Take 1 tablet (5 mg total) by mouth daily. 90 tablet 0  . aspirin 81 MG tablet Take 81 mg by mouth daily.    Marland Kitchen atorvastatin (LIPITOR) 80 MG tablet Take 1 tablet (80 mg total) by mouth daily. 90 tablet 1  . busPIRone (BUSPAR) 15 MG tablet Take 1 tablet by mouth twice daily 60 tablet 2  . carvedilol (COREG) 25 MG tablet Take 1 tablet (25 mg total) by mouth 2 (two) times daily with a meal. 180 tablet 1  . Cholecalciferol (VITAMIN D3) 400 units CAPS Take 400 mg by mouth daily.    . dapagliflozin propanediol (FARXIGA) 10 MG TABS tablet Take 10 mg by mouth daily. 90 tablet 0  . doxepin (SINEQUAN) 25 MG capsule Take 25 mg by mouth at bedtime.    . famciclovir (FAMVIR) 500 MG tablet Take 1 tablet (500 mg total) by mouth 3 (three) times daily. 21 tablet 0  . fluticasone (FLONASE) 50 MCG/ACT nasal spray Place 2 sprays into both nostrils daily. 16 g 1  . fluticasone furoate-vilanterol (BREO ELLIPTA)  100-25 MCG/INH AEPB Inhale 1 puff into the lungs daily. 100 each 2  . gabapentin (NEURONTIN) 300 MG capsule Take 300 mg by mouth 4 (four) times daily as needed for pain.    . hydrochlorothiazide (MICROZIDE) 12.5 MG capsule TAKE 1 CAPSULE BY MOUTH ONCE DAILY FOR 3 DAYS 3 capsule 0  . hydrOXYzine (ATARAX/VISTARIL) 10 MG tablet 1-2 tab p q 8 hours 30 tablet 0  . insulin degludec (TRESIBA FLEXTOUCH) 100  UNIT/ML SOPN FlexTouch Pen Inject 40 Units into the skin daily at 10 pm.    . meloxicam (MOBIC) 7.5 MG tablet TAKE 1 TO 2 TABLETS BY MOUTH ONCE DAILY 30 tablet 0  . metFORMIN (GLUCOPHAGE-XR) 500 MG 24 hr tablet 2 (two) times daily.    . methocarbamol (ROBAXIN) 500 MG tablet Take 1 tablet (500 mg total) by mouth 2 (two) times daily. 20 tablet 0  . metoCLOPramide (REGLAN) 10 MG tablet Take 1 tablet (10 mg total) by mouth every 8 (eight) hours as needed for nausea. 20 tablet 0  . naproxen (NAPROSYN) 500 MG tablet Take 1 tablet (500 mg total) by mouth 2 (two) times daily. 30 tablet 0  . neomycin-polymyxin-hydrocortisone (CORTISPORIN) OTIC solution Place 3 drops into the left ear 4 (four) times daily. 10 mL 0  . nitroGLYCERIN (NITROSTAT) 0.4 MG SL tablet Place 1 tablet (0.4 mg total) under the tongue every 5 (five) minutes as needed. 25 tablet 3  . omeprazole (PRILOSEC) 20 MG capsule Take 1 capsule (20 mg total) by mouth daily. 30 capsule 3  . oxyCODONE (OXY IR/ROXICODONE) 5 MG immediate release tablet 1 tab po twice daily as needed for severe pain 45 tablet 0  . polyethylene glycol (MIRALAX / GLYCOLAX) packet Take 17 g by mouth daily. 14 each 0  . promethazine (PHENERGAN) 12.5 MG tablet TAKE 1 TABLET BY MOUTH EVERY 8 HOURS AS NEEDED FOR  NAUSEA  OR  VOMITING 20 tablet 0  . ranitidine (ZANTAC) 150 MG capsule Take 1 capsule (150 mg total) by mouth 2 (two) times daily. 60 capsule 3  . senna-docusate (SENOKOT-S) 8.6-50 MG tablet Take 2 tablets by mouth 2 (two) times daily. 60 tablet 1  . spironolactone  (ALDACTONE) 25 MG tablet Take 1 tablet (25 mg total) by mouth daily. 90 tablet 0  . sucralfate (CARAFATE) 1 g tablet Take 1 tablet (1 g total) by mouth 4 (four) times daily -  with meals and at bedtime. 120 tablet 0  . tiZANidine (ZANAFLEX) 4 MG capsule TAKE 1 CAPSULE BY MOUTH EVERY DAY AT BEDTIME AS NEEDED FOR MUSCLE SPASMS/CRAMPS 30 capsule 0  . tizanidine (ZANAFLEX) 6 MG capsule Take 1 capsule (6 mg total) by mouth 3 (three) times daily as needed for muscle spasms. 21 capsule 0   No current facility-administered medications on file prior to visit.    There were no vitals taken for this visit.      Objective:   Physical Exam  General-no acute distress, pleasant, oriented. Lungs- on inspection lungs appear unlabored. Neck- no tracheal deviation or jvd on inspection. Neuro- gross motor function appears intact. Anterior chest- she points to area medial to shoulder/pectoralis area as source of pain.       Assessment & Plan:  You had some rt side upper pectoralis muscle area pain. Improved with treatment but still present to some degree. Can continue robaxin and meloxicam. Stop flexeril and naprosyn.  Please send me bp and pulse reading by my chart later today.  If your pain worsens, persists or changes let me know.  Any severe pain then return to ED.  Pt scheduled as virtual visit. I saw late in the day when all staff gone. I would have preferred follow up chest pain of any sort be in person as opposed to virtual but staff scheduled her for virtual. So counseled pt extensively on ED if signs/symptosm worsens. She expressed understanding.  For anxiety can increase buspar to 15 mg to three times daily.  Work excuse given.  Follow up 7 days or as needed  Time spent with patient today was 25  minutes which consisted of chart review, discussing diagnosis, work up, treatment and documentation.   Mackie Pai, PA-C

## 2019-06-06 NOTE — Patient Instructions (Signed)
You had some rt side upper pectoralis muscle area pain. Improved with treatment but still present to some degree. Can continue robaxin and meloxicam. Stop flexeril and naprosyn.  Please send me bp and pulse reading by my chart later today.  If your pain worsens, persists or changes let me know.  Any severe pain then return to ED.  Pt scheduled as virtual visit. I saw late in the day when all staff gone. I would have preferred follow up chest pain of any sort be in person as opposed to virtual but staff scheduled her for virtual. So counseled pt extensively on ED if signs/symptosm worsens. She expressed understanding.  For anxiety can increase buspar to 15 mg to three times daily.  Work excuse given.  Follow up 7 days or as needed

## 2019-06-13 ENCOUNTER — Encounter: Payer: Self-pay | Admitting: Cardiology

## 2019-06-13 ENCOUNTER — Other Ambulatory Visit: Payer: Self-pay | Admitting: Cardiology

## 2019-06-13 ENCOUNTER — Other Ambulatory Visit: Payer: Self-pay

## 2019-06-13 ENCOUNTER — Ambulatory Visit (INDEPENDENT_AMBULATORY_CARE_PROVIDER_SITE_OTHER): Payer: 59 | Admitting: Cardiology

## 2019-06-13 ENCOUNTER — Telehealth: Payer: Self-pay | Admitting: Medical

## 2019-06-13 VITALS — BP 120/80 | HR 86 | Ht 72.0 in | Wt 177.0 lb

## 2019-06-13 DIAGNOSIS — I10 Essential (primary) hypertension: Secondary | ICD-10-CM

## 2019-06-13 DIAGNOSIS — Z79899 Other long term (current) drug therapy: Secondary | ICD-10-CM

## 2019-06-13 DIAGNOSIS — E785 Hyperlipidemia, unspecified: Secondary | ICD-10-CM

## 2019-06-13 DIAGNOSIS — N183 Chronic kidney disease, stage 3 unspecified: Secondary | ICD-10-CM | POA: Diagnosis not present

## 2019-06-13 DIAGNOSIS — E119 Type 2 diabetes mellitus without complications: Secondary | ICD-10-CM

## 2019-06-13 DIAGNOSIS — N1831 Chronic kidney disease, stage 3a: Secondary | ICD-10-CM

## 2019-06-13 DIAGNOSIS — Z111 Encounter for screening for respiratory tuberculosis: Secondary | ICD-10-CM

## 2019-06-13 DIAGNOSIS — E1122 Type 2 diabetes mellitus with diabetic chronic kidney disease: Secondary | ICD-10-CM

## 2019-06-13 DIAGNOSIS — E782 Mixed hyperlipidemia: Secondary | ICD-10-CM

## 2019-06-13 DIAGNOSIS — E1121 Type 2 diabetes mellitus with diabetic nephropathy: Secondary | ICD-10-CM

## 2019-06-13 MED ORDER — MELOXICAM 7.5 MG PO TABS: ORAL_TABLET | ORAL | 0 refills | Status: DC

## 2019-06-13 NOTE — Telephone Encounter (Signed)
Medication sent.

## 2019-06-13 NOTE — Progress Notes (Signed)
Cardiology Office Note:    Date:  06/13/2019   ID:  [name], DOB 16-Mar-1965, MRN [id number]  PCP:  Mackie Pai, PA-C  Cardiologist:  Jenean Lindau, MD   Referring MD: Mackie Pai, PA-C    ASSESSMENT:    1. Essential hypertension   2. Mixed hyperlipidemia   3. Stage 3 chronic kidney disease, unspecified whether stage 3a or 3b CKD   4. Type 2 diabetes mellitus with stage 3a chronic kidney disease, without long-term current use of insulin (HCC)    PLAN:    In order of problems listed above:  1. Coronary artery disease: Secondary prevention stressed with the patient.  Importance of compliance with diet and medication stressed and she vocalized understanding.  Results of stress test and echocardiogram were detailed to her.  Medical management was emphasized.  I told her to walk at least half an hour a day 5 days a week and she promises to do so. 2. Essential hypertension: Blood pressure is stable and diet was emphasized 3. Mixed dyslipidemia: Patient on statin therapy. 4. Diabetes mellitus: Managed by primary care physician.   5. Renal insufficiency stable and managed by primary care physician and importance of diet and blood pressure optimization was stressed. 6. She will be seen in follow-up appointment in 6 to 8 weeks or earlier if she has any concerns.  She is happy that her chest discomfort symptoms have resolved and she is walking regularly without any symptoms.   Medication Adjustments/Labs and Tests Ordered: Current medicines are reviewed at length with the patient today.  Concerns regarding medicines are outlined above.  No orders of the defined types were placed in this encounter.  No orders of the defined types were placed in this encounter.    Chief Complaint  Patient presents with  . Follow-up    2 Months     History of Present Illness:    [name] is a 54 y.o. female.  Patient has past medical history of coronary artery disease,  essential hypertension dyslipidemia and diabetes mellitus and renal insufficiency.  She denies any problems at this time and takes care of activities of daily living.  No chest pain orthopnea or PND.  She went to the emergency room recently.  She tells me that she had a panic attack.  She also tells me that she pulled her pectoralis muscle and subsequently was having the chest discomfort there.  No orthopnea or PND.  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  At the time of my evaluation, the patient is alert awake oriented and in no distress.  Past Medical History:  Diagnosis Date  . Cervical cancer ([place])    had surgery in 2001.  Marland Kitchen Chronic back pain   . Coronary artery disease    30% lesions noted  . Depression   . Diabetes mellitus   . GERD (gastroesophageal reflux disease)   . Hyperlipidemia   . Hypertension   . Neuropathy     Past Surgical History:  Procedure Laterality Date  . ABDOMINAL HYSTERECTOMY     partial  . CHOLECYSTECTOMY      Current Medications: Current Meds  Medication Sig  . Albuterol Sulfate (PROAIR RESPICLICK) 123XX123 (90 Base) MCG/ACT AEPB Inhale 2 puffs into the lungs at bedtime as needed.  Marland Kitchen amLODipine (NORVASC) 5 MG tablet Take 1 tablet (5 mg total) by mouth daily.  Marland Kitchen aspirin 81 MG  tablet Take 81 mg by mouth daily.  Marland Kitchen atorvastatin (LIPITOR) 80 MG tablet Take 1 tablet (80 mg total) by mouth daily.  . busPIRone (BUSPAR) 15 MG tablet Take 1 tablet (15 mg total) by mouth 3 (three) times daily.  . carvedilol (COREG) 25 MG tablet Take 1 tablet (25 mg total) by mouth 2 (two) times daily with a meal.  . Cholecalciferol (VITAMIN D3) 400 units CAPS Take 400 mg by mouth daily.  . dapagliflozin propanediol (FARXIGA) 10 MG TABS tablet Take 10 mg by mouth daily.  Marland Kitchen doxepin (SINEQUAN) 25 MG capsule Take 25 mg by mouth at bedtime.  . fluticasone (FLONASE) 50 MCG/ACT nasal spray Place 2 sprays into both nostrils daily.  . fluticasone  furoate-vilanterol (BREO ELLIPTA) 100-25 MCG/INH AEPB Inhale 1 puff into the lungs daily.  Marland Kitchen gabapentin (NEURONTIN) 300 MG capsule Take 300 mg by mouth 4 (four) times daily as needed for pain.  Marland Kitchen insulin degludec (TRESIBA FLEXTOUCH) 100 UNIT/ML SOPN FlexTouch Pen Inject 40 Units into the skin daily at 10 pm.  . meloxicam (MOBIC) 7.5 MG tablet TAKE 1 TO 2 TABLETS BY MOUTH ONCE DAILY  . metFORMIN (GLUCOPHAGE-XR) 500 MG 24 hr tablet 2 (two) times daily.  . methocarbamol (ROBAXIN-750) 750 MG tablet Take 1 tablet (750 mg total) by mouth every 8 (eight) hours as needed for muscle spasms.  . naproxen (NAPROSYN) 500 MG tablet Take 1 tablet (500 mg total) by mouth 2 (two) times daily.  Marland Kitchen neomycin-polymyxin-hydrocortisone (CORTISPORIN) OTIC solution Place 3 drops into the left ear 4 (four) times daily.  . nitroGLYCERIN (NITROSTAT) 0.4 MG SL tablet Place 1 tablet (0.4 mg total) under the tongue every 5 (five) minutes as needed.  Marland Kitchen oxyCODONE (OXY IR/ROXICODONE) 5 MG immediate release tablet 1 tab po twice daily as needed for severe pain  . polyethylene glycol (MIRALAX / GLYCOLAX) packet Take 17 g by mouth daily.  . promethazine (PHENERGAN) 12.5 MG tablet TAKE 1 TABLET BY MOUTH EVERY 8 HOURS AS NEEDED FOR  NAUSEA  OR  VOMITING  . spironolactone (ALDACTONE) 25 MG tablet Take 1 tablet (25 mg total) by mouth daily.  . tizanidine (ZANAFLEX) 6 MG capsule Take 1 capsule (6 mg total) by mouth 3 (three) times daily as needed for muscle spasms.     Allergies:   Insulin aspart, Latex, and Morphine   Social History   Socioeconomic History  . Marital status: Single    Spouse name: Not on file  . Number of children: Not on file  . Years of education: Not on file  . Highest education level: Not on file  Occupational History  . Not on file  Tobacco Use  . Smoking status: Never Smoker  . Smokeless tobacco: Never Used  Substance and Sexual Activity  . Alcohol use: Yes    Comment: occ  . Drug use: No  . Sexual  activity: Not on file  Other Topics Concern  . Not on file  Social History Narrative  . Not on file   Social Determinants of Health   Financial Resource Strain:   . Difficulty of Paying Living Expenses:   Food Insecurity:   . Worried About Charity fundraiser in the Last Year:   . Arboriculturist in the Last Year:   Transportation Needs:   . Film/video editor (Medical):   Marland Kitchen Lack of Transportation (Non-Medical):   Physical Activity:   . Days of Exercise per Week:   . Minutes of Exercise per  Session:   Stress:   . Feeling of Stress :   Social Connections:   . Frequency of Communication with Friends and Family:   . Frequency of Social Gatherings with Friends and Family:   . Attends Religious Services:   . Active Member of Clubs or Organizations:   . Attends Archivist Meetings:   Marland Kitchen Marital Status:      Family History: The patient's family history includes Diabetes in her father and mother.  ROS:   Please see the history of present illness.    All other systems reviewed and are negative.  EKGs/Labs/Other Studies Reviewed:    The following studies were reviewed today: I reviewed emergency room records.  EKG reveals sinus rhythm and nonspecific ST-T changes.   Recent Labs: 06/03/2019: ALT 31; BUN 19; Creatinine, Ser 1.13; Hemoglobin 13.5; Platelets 285; Potassium 4.6; Sodium 135  Recent Lipid Panel    Component Value Date/Time   CHOL  07/18/2009 0355    193        ATP III CLASSIFICATION:  <200     mg/dL   Desirable  200-239  mg/dL   Borderline High  >=240    mg/dL   High          TRIG 61 07/18/2009 0355   HDL 40 07/18/2009 0355   CHOLHDL 4.8 07/18/2009 0355   VLDL 12 07/18/2009 0355   LDLCALC (H) 07/18/2009 0355    141        Total Cholesterol/HDL:CHD Risk Coronary Heart Disease Risk Table                     Men   Women  1/2 Average Risk   3.4   3.3  Average Risk       5.0   4.4  2 X Average Risk   9.6   7.1  3 X Average Risk  23.4   11.0         Use the calculated Patient Ratio above and the CHD Risk Table to determine the patient's CHD Risk.        ATP III CLASSIFICATION (LDL):  <100     mg/dL   Optimal  100-129  mg/dL   Near or Above                    Optimal  130-159  mg/dL   Borderline  160-189  mg/dL   High  >190     mg/dL   Very High    Physical Exam:    VS:  BP 120/80   Pulse 86   Ht 6' (1.829 m)   Wt 177 lb (80.3 kg)   SpO2 99%   BMI 24.01 kg/m     Wt Readings from Last 3 Encounters:  06/13/19 177 lb (80.3 kg)  06/03/19 180 lb (81.6 kg)  03/09/19 177 lb (80.3 kg)     GEN: Patient is in no acute distress HEENT: Normal NECK: No JVD; No carotid bruits LYMPHATICS: No lymphadenopathy CARDIAC: Hear sounds regular, 2/6 systolic murmur at the apex. RESPIRATORY:  Clear to auscultation without rales, wheezing or rhonchi  ABDOMEN: Soft, non-tender, non-distended MUSCULOSKELETAL:  No edema; No deformity  SKIN: Warm and dry NEUROLOGIC:  Alert and oriented x 3 PSYCHIATRIC:  Normal affect   Signed, Jenean Lindau, MD  06/13/2019 10:57 AM    [place]

## 2019-06-13 NOTE — Telephone Encounter (Signed)
  Medication:  meloxicam (MOBIC) 7.5 MG tablet [id number]   Has the patient contacted their pharmacy? No. (If no, request that the patient contact the pharmacy for the refill.) (If yes, when and what did the pharmacy advise?)  Preferred Pharmacy (with phone number or street name):  [facility]  [place], [place] [address]  Phone:  [phone] Fax:  [phone]  DEA #:  --  Agent: Please be advised that RX refills may take up to 3 business days. We ask that you follow-up with your pharmacy.

## 2019-06-13 NOTE — Patient Instructions (Signed)
Medication Instructions:  No medication changes. *If you need a refill on your cardiac medications before your next appointment, please call your pharmacy*   Lab Work: None ordered If you have labs (blood work) drawn today and your tests are completely normal, you will receive your results only by: . MyChart Message (if you have MyChart) OR . A paper copy in the mail If you have any lab test that is abnormal or we need to change your treatment, we will call you to review the results.   Testing/Procedures: None ordered   Follow-Up: At [facility], you and your health needs are our priority.  As part of our continuing mission to provide you with exceptional heart care, we have created designated Provider Care Teams.  These Care Teams include your primary Cardiologist (physician) and Advanced Practice Providers (APPs -  Physician Assistants and Nurse Practitioners) who all work together to provide you with the care you need, when you need it.  We recommend signing up for the patient portal called "MyChart".  Sign up information is provided on this After Visit Summary.  MyChart is used to connect with patients for Virtual Visits (Telemedicine).  Patients are able to view lab/test results, encounter notes, upcoming appointments, etc.  Non-urgent messages can be sent to your provider as well.   To learn more about what you can do with MyChart, go to [email or web address].    Your next appointment:   6 week(s)  The format for your next appointment:   In Person  Provider:   Rajan Revankar, MD   Other Instructions NA  

## 2019-06-23 ENCOUNTER — Telehealth: Payer: Self-pay

## 2019-06-23 MED ORDER — GABAPENTIN 300 MG PO CAPS: 300.0000 mg | ORAL_CAPSULE | Freq: Four times a day (QID) | ORAL | 0 refills | Status: DC | PRN

## 2019-06-23 NOTE — Telephone Encounter (Signed)
Medication sent.

## 2019-06-23 NOTE — Telephone Encounter (Signed)
Patient called in to get a prescription refill for gabapentin (NEURONTIN) 300 MG capsule [id number]    Please send it to [facility]  [place], [place] [address]  Phone:  [phone] Fax:  [phone]  DEA #:  --

## 2019-07-08 ENCOUNTER — Other Ambulatory Visit: Payer: Self-pay | Admitting: Medical

## 2019-07-08 NOTE — Telephone Encounter (Signed)
Medication: methocarbamol (ROBAXIN-750) 750 MG tablet   oxyCODONE (OXY IR/ROXICODONE) 5 MG immediate release tablet    Has the patient contacted their pharmacy?  (If no, request that the patient contact the pharmacy for the refill.) (If yes, when and what did the pharmacy advise?)     Preferred Pharmacy (with phone number or street name): [facility] [place] [facility], [place] [address]  Phone:  [phone] Fax:  [phone]      Agent: Please be advised that RX refills may take up to 3 business days. We ask that you follow-up with your pharmacy.

## 2019-07-08 NOTE — Telephone Encounter (Signed)
Pt notified 04/11/19 that she would need to have updated contract to receive refill on meds

## 2019-07-16 ENCOUNTER — Other Ambulatory Visit: Payer: Self-pay | Admitting: Medical

## 2019-07-21 ENCOUNTER — Other Ambulatory Visit: Payer: Self-pay | Admitting: Medical

## 2019-08-01 ENCOUNTER — Other Ambulatory Visit: Payer: Self-pay | Admitting: Medical

## 2019-08-08 ENCOUNTER — Ambulatory Visit: Payer: 59 | Admitting: Cardiology

## 2019-08-15 ENCOUNTER — Other Ambulatory Visit: Payer: Self-pay | Admitting: Medical

## 2019-08-30 ENCOUNTER — Other Ambulatory Visit: Payer: Self-pay | Admitting: Medical

## 2019-09-06 ENCOUNTER — Other Ambulatory Visit: Payer: Self-pay

## 2019-09-06 DIAGNOSIS — E785 Hyperlipidemia, unspecified: Secondary | ICD-10-CM | POA: Insufficient documentation

## 2019-09-06 DIAGNOSIS — I2510 Atherosclerotic heart disease of native coronary artery without angina pectoris: Secondary | ICD-10-CM | POA: Insufficient documentation

## 2019-09-06 DIAGNOSIS — G8929 Other chronic pain: Secondary | ICD-10-CM | POA: Insufficient documentation

## 2019-09-06 DIAGNOSIS — I10 Essential (primary) hypertension: Secondary | ICD-10-CM | POA: Insufficient documentation

## 2019-09-06 DIAGNOSIS — C539 Malignant neoplasm of cervix uteri, unspecified: Secondary | ICD-10-CM | POA: Insufficient documentation

## 2019-09-06 DIAGNOSIS — K219 Gastro-esophageal reflux disease without esophagitis: Secondary | ICD-10-CM | POA: Insufficient documentation

## 2019-09-07 ENCOUNTER — Other Ambulatory Visit: Payer: Self-pay

## 2019-09-07 ENCOUNTER — Ambulatory Visit (INDEPENDENT_AMBULATORY_CARE_PROVIDER_SITE_OTHER): Payer: 59 | Admitting: Cardiology

## 2019-09-07 ENCOUNTER — Encounter: Payer: Self-pay | Admitting: Cardiology

## 2019-09-07 VITALS — BP 118/72 | HR 72 | Ht 72.0 in | Wt 185.0 lb

## 2019-09-07 DIAGNOSIS — I10 Essential (primary) hypertension: Secondary | ICD-10-CM

## 2019-09-07 DIAGNOSIS — I2510 Atherosclerotic heart disease of native coronary artery without angina pectoris: Secondary | ICD-10-CM

## 2019-09-07 DIAGNOSIS — E782 Mixed hyperlipidemia: Secondary | ICD-10-CM

## 2019-09-07 NOTE — Progress Notes (Signed)
Cardiology Office Note:    Date:  09/07/2019   ID:  [name], DOB February 07, 1966, MRN [id number]  PCP:  Mackie Pai, PA-C  Cardiologist:  Jenean Lindau, MD   Referring MD: Mackie Pai, PA-C    ASSESSMENT:    1. Coronary artery disease involving native coronary artery of native heart without angina pectoris   2. Essential hypertension   3. Mixed hyperlipidemia    PLAN:    In order of problems listed above:  1. Coronary artery disease: Secondary prevention stressed with patient.  Importance of compliance with diet medication stressed and she vocalized understanding.  She is now exercising at least half an hour a day 5 days a week. 2. Essential hypertension: Blood pressure stable in dietary measures were discussed at length. 3. Mixed dyslipidemia: Patient on statin therapy and lipids followed by primary care physician.  She tells me that the next time she sees her primary care physician she will go fasting and get blood work also she was advised not to smoke and she promises to do so.  Risks of smoking explained. 4. Patient will be seen in follow-up appointment in 6 months or earlier if the patient has any concerns    Medication Adjustments/Labs and Tests Ordered: Current medicines are reviewed at length with the patient today.  Concerns regarding medicines are outlined above.  No orders of the defined types were placed in this encounter.  No orders of the defined types were placed in this encounter.    No chief complaint on file.    History of Present Illness:    [name] is a 54 y.o. female.  Patient has past medical history of coronary artery disease, essential hypertension and dyslipidemia.  She denies any problems at this time and takes care of activities of daily living.  She has diabetes mellitus.  She is exercising regularly and happy with her progress.  At the time of my evaluation, the patient is alert awake oriented and in no  distress.  Past Medical History:  Diagnosis Date  . ABDOMINAL PAIN-EPIGASTRIC 09/14/2009   Qualifier: Diagnosis of  By: Trellis Paganini PA-c, [name]   . Acute cystitis 07/07/2017  . ANKLE PAIN, LEFT 12/25/2009   Qualifier: Diagnosis of  By: Amil Amen MD, Benjamine Mola    . Anxiety 01/22/2016  . CAD (coronary artery disease) 07/07/2017  . Cardiac murmur 02/01/2019  . Cardiomyopathy, secondary ([place]) 09/06/2009   Qualifier: Diagnosis of  By: Arvid Right   Formatting of this note might be different from the original. Overview:  Qualifier: Diagnosis of  By: Arvid Right  . Cardiomyopathy, unspecified ([place]) 09/06/2009   Formatting of this note might be different from the original. Overview:  Overview:  Qualifier: Diagnosis of  By: Arvid Right  Overview:  Overview:  Qualifier: Diagnosis of  By: Arvid Right Formatting of this note might be different from the original. Overview:  Qualifier: Diagnosis of  By: Arvid Right  . Cerebrovascular disease 09/13/2012  . Cervical cancer ([place])    had surgery in 2001.  . CHEST PAIN, ATYPICAL 07/30/2009   Qualifier: Diagnosis of  By: Amil Amen MD, Winona Legato of this note might be different from the original. Overview:  Qualifier: Diagnosis of  By: Amil Amen MD, Benjamine Mola  . Chronic back pain   . Chronic kidney disease, stage III (moderate) 05/23/2014  . Chronic pain syndrome 01/23/2016  . Coronary artery disease  30% lesions noted  . Current use of insulin ([place]) 05/23/2014  . Depression   . Diabetes mellitus   . DIABETES MELLITUS, TYPE II, UNCONTROLLED 07/30/2009   Qualifier: Diagnosis of  By: Amil Amen MD, Benjamine Mola    . Diabetes type 2, uncontrolled ([place]) 05/23/2014  . Diabetic gastroparesis associated with type 1 diabetes mellitus ([place]) 11/27/2016  . DIABETIC PERIPHERAL NEUROPATHY 09/21/2009   Qualifier: Diagnosis of  By: Amil Amen MD, Benjamine Mola    . Diabetic peripheral  neuropathy ([place]) 01/23/2016  . Dysuria 12/25/2009   Qualifier: Diagnosis of  By: Amil Amen MD, Benjamine Mola    . Elevation of level of transaminase or lactic acid dehydrogenase (LDH) 09/21/2009   Formatting of this note might be different from the original. Overview:  Qualifier: Diagnosis of  By: Amil Amen MD, Benjamine Mola  . Essential hypertension 08/30/2009   Qualifier: Diagnosis of  By: Lovette Cliche, CNA, Christy    . Gastric atony 07/30/2009   Formatting of this note might be different from the original. Overview:  Qualifier: Diagnosis of  By: Amil Amen MD, Benjamine Mola  . Gastroesophageal reflux disease 07/30/2009   Formatting of this note might be different from the original. Overview:  Overview:  Qualifier: Diagnosis of  By: Amil Amen MD, Elby Showers: Diagnosis of  By: Chester Holstein NP, Ellwood Handler of this note might be different from the original. Overview:  Qualifier: Diagnosis of  By: Amil Amen MD, Elby Showers: Diagnosis of  By: Chester Holstein NP, Nevin Bloodgood  . Gastroparesis 07/30/2009   Qualifier: Diagnosis of  By: Amil Amen MD, Benjamine Mola    . GERD 07/30/2009   Qualifier: Diagnosis of  By: Amil Amen MD, Elby Showers: Diagnosis of  By: Chester Holstein NP, Nevin Bloodgood    . GERD (gastroesophageal reflux disease)   . History of cervical cancer 08/17/2015   Status post partial hysterectomy  Formatting of this note might be different from the original. Status post partial hysterectomy  . HLD (hyperlipidemia) 07/30/2009   Qualifier: Diagnosis of  By: Amil Amen MD, Benjamine Mola    . Hyperlipidemia   . Hyperlipidemia, unspecified 07/30/2009   Formatting of this note might be different from the original. Overview:  Qualifier: Diagnosis of  By: Amil Amen MD, Winona Legato of this note might be different from the original. Overview:  Qualifier: Diagnosis of  By: Amil Amen MD, Benjamine Mola  . Hypertension   . INSOMNIA 09/21/2009   Qualifier: Diagnosis of  By: Amil Amen MD, Benjamine Mola    . Lumbar facet arthropathy 05/14/2016  .  Lumbar spinal stenosis 02/11/2018  . Metatarsalgia of both feet 03/11/2017  . Migraine 09/13/2012   Overview:  IMPRESSION: possible abd migraine  . Mixed dyslipidemia 02/01/2019  . Nausea & vomiting 07/07/2017  . Nausea with vomiting, unspecified 09/14/2009   Qualifier: Diagnosis of  By: Shane Crutch, [name]   Formatting of this note might be different from the original. Overview:  Qualifier: Diagnosis of  By: Shane Crutch, [name]  . Neuropathy   . Nonspecific abnormal findings on radiological and examination of skull and head 09/13/2012  . Overweight (BMI 25.0-29.9) 07/21/2014  . POSITIVE PPD 07/30/2009   Annotation: CXR clear ?  diagnosed in 2/11?  On INH/pyridoxine Qualifier: Diagnosis of  By: Amil Amen MD, Benjamine Mola    . Pure hypercholesterolemia 11/27/2016  . Retention of urine 06/25/2011  . Right flank pain   . TOBACCO ABUSE 09/21/2009   Qualifier: Diagnosis of  By: Amil Amen MD, Winona Legato of this note might be different from the original. Overview:  Qualifier:  Diagnosis of  By: Amil Amen MD, Benjamine Mola  . TRANSAMINASES, SERUM, ELEVATED 09/21/2009   Qualifier: Diagnosis of  By: Amil Amen MD, Benjamine Mola    . Tuberculin test reaction 07/30/2009   Formatting of this note might be different from the original. Overview:  Annotation: CXR clear ?  diagnosed in 2/11?  On INH/pyridoxine Qualifier: Diagnosis of  By: Amil Amen MD, Benjamine Mola  . Type 2 diabetes mellitus with hyperglycemia ([place]) 07/30/2009   Qualifier: Diagnosis of  By: Amil Amen MD, Winona Legato of this note might be different from the original. Overview:  Qualifier: Diagnosis of  By: Amil Amen MD, Benjamine Mola  . Type 2 diabetes mellitus with hyperglycemia, with long-term current use of insulin ([place]) 07/30/2009   Formatting of this note might be different from the original. Overview:  05/30/2015 A1C was 16.7% (!)  Overview:  05/30/2015 A1C was 16.7% (!) Formatting of this note might be different from the original. 05/30/2015 A1C was 16.7%  (!)  . Type 2 diabetes mellitus with stage 3 chronic kidney disease ([place]) 09/14/2009   Qualifier: Diagnosis of  By: Trellis Paganini PA-c, [name]   . ULCER-GASTRIC 09/28/2009   Qualifier: Diagnosis of  By: Chester Holstein NP, Nevin Bloodgood    . URINARY TRACT INFECTION 09/21/2009   Qualifier: Diagnosis of  By: Amil Amen MD, Benjamine Mola    . VAGINITIS, CANDIDAL 12/25/2009   Qualifier: Diagnosis of  By: Amil Amen MD, Benjamine Mola    . Vitamin D deficiency 04/25/2013    Past Surgical History:  Procedure Laterality Date  . ABDOMINAL HYSTERECTOMY     partial  . CHOLECYSTECTOMY      Current Medications: Current Meds  Medication Sig  . Albuterol Sulfate (PROAIR RESPICLICK) 374 (90 Base) MCG/ACT AEPB Inhale 2 puffs into the lungs at bedtime as needed.  Marland Kitchen amLODipine (NORVASC) 5 MG tablet Take 1 tablet (5 mg total) by mouth daily.  Marland Kitchen aspirin 81 MG tablet Take 81 mg by mouth daily.  Marland Kitchen atorvastatin (LIPITOR) 80 MG tablet Take 1 tablet by mouth once daily  . busPIRone (BUSPAR) 15 MG tablet TAKE 1 TABLET BY MOUTH THREE TIMES DAILY  . carvedilol (COREG) 25 MG tablet Take 1 tablet (25 mg total) by mouth 2 (two) times daily with a meal.  . Cholecalciferol (VITAMIN D3) 400 units CAPS Take 400 mg by mouth daily.  Marland Kitchen FARXIGA 10 MG TABS tablet Take 1 tablet by mouth once daily  . fluticasone (FLONASE) 50 MCG/ACT nasal spray Place 2 sprays into both nostrils daily.  . fluticasone furoate-vilanterol (BREO ELLIPTA) 100-25 MCG/INH AEPB Inhale 1 puff into the lungs daily.  Marland Kitchen gabapentin (NEURONTIN) 300 MG capsule Take 2 capsules by mouth 3 (three) times daily.  . insulin degludec (TRESIBA FLEXTOUCH) 100 UNIT/ML SOPN FlexTouch Pen Inject 40 Units into the skin daily at 10 pm.  . meloxicam (MOBIC) 7.5 MG tablet Take 1-2 tablets (7.5-15 mg total) by mouth daily.  . metFORMIN (GLUCOPHAGE-XR) 500 MG 24 hr tablet 2 (two) times daily.  . methocarbamol (ROBAXIN) 750 MG tablet TAKE 1 TABLET BY MOUTH EVERY 8 HOURS AS NEEDED  . naproxen (NAPROSYN) 500  MG tablet Take 1 tablet (500 mg total) by mouth 2 (two) times daily.  Marland Kitchen neomycin-polymyxin-hydrocortisone (CORTISPORIN) OTIC solution Place 3 drops into the left ear 4 (four) times daily.  Marland Kitchen oxyCODONE (OXY IR/ROXICODONE) 5 MG immediate release tablet 1 tab po twice daily as needed for severe pain  . polyethylene glycol (MIRALAX / GLYCOLAX) packet Take 17 g by mouth daily.  . promethazine (PHENERGAN) 12.5 MG  tablet TAKE 1 TABLET BY MOUTH EVERY 8 HOURS AS NEEDED FOR  NAUSEA  OR  VOMITING  . spironolactone (ALDACTONE) 25 MG tablet Take 1 tablet by mouth once daily  . tizanidine (ZANAFLEX) 6 MG capsule Take 1 capsule (6 mg total) by mouth 3 (three) times daily as needed for muscle spasms.     Allergies:   Insulin aspart, Latex, and Morphine   Social History   Socioeconomic History  . Marital status: Single    Spouse name: Not on file  . Number of children: Not on file  . Years of education: Not on file  . Highest education level: Not on file  Occupational History  . Not on file  Tobacco Use  . Smoking status: Never Smoker  . Smokeless tobacco: Never Used  Vaping Use  . Vaping Use: Never used  Substance and Sexual Activity  . Alcohol use: Yes    Comment: occ  . Drug use: No  . Sexual activity: Not on file  Other Topics Concern  . Not on file  Social History Narrative  . Not on file   Social Determinants of Health   Financial Resource Strain:   . Difficulty of Paying Living Expenses:   Food Insecurity:   . Worried About Charity fundraiser in the Last Year:   . Arboriculturist in the Last Year:   Transportation Needs:   . Film/video editor (Medical):   Marland Kitchen Lack of Transportation (Non-Medical):   Physical Activity:   . Days of Exercise per Week:   . Minutes of Exercise per Session:   Stress:   . Feeling of Stress :   Social Connections:   . Frequency of Communication with Friends and Family:   . Frequency of Social Gatherings with Friends and Family:   . Attends  Religious Services:   . Active Member of Clubs or Organizations:   . Attends Archivist Meetings:   Marland Kitchen Marital Status:      Family History: The patient's family history includes Diabetes in her father and mother.  ROS:   Please see the history of present illness.    All other systems reviewed and are negative.  EKGs/Labs/Other Studies Reviewed:    The following studies were reviewed today: I discussed my findings with the patient at length.   Recent Labs: 06/03/2019: ALT 31; BUN 19; Creatinine, Ser 1.13; Hemoglobin 13.5; Platelets 285; Potassium 4.6; Sodium 135  Recent Lipid Panel    Component Value Date/Time   CHOL  07/18/2009 0355    193        ATP III CLASSIFICATION:  <200     mg/dL   Desirable  200-239  mg/dL   Borderline High  >=240    mg/dL   High          TRIG 61 07/18/2009 0355   HDL 40 07/18/2009 0355   CHOLHDL 4.8 07/18/2009 0355   VLDL 12 07/18/2009 0355   LDLCALC (H) 07/18/2009 0355    141        Total Cholesterol/HDL:CHD Risk Coronary Heart Disease Risk Table                     Men   Women  1/2 Average Risk   3.4   3.3  Average Risk       5.0   4.4  2 X Average Risk   9.6   7.1  3 X Average Risk  23.4  11.0        Use the calculated Patient Ratio above and the CHD Risk Table to determine the patient's CHD Risk.        ATP III CLASSIFICATION (LDL):  <100     mg/dL   Optimal  100-129  mg/dL   Near or Above                    Optimal  130-159  mg/dL   Borderline  160-189  mg/dL   High  >190     mg/dL   Very High    Physical Exam:    VS:  BP 118/72   Pulse 72   Ht 6' (1.829 m)   Wt 185 lb (83.9 kg)   SpO2 99%   BMI 25.09 kg/m     Wt Readings from Last 3 Encounters:  09/07/19 185 lb (83.9 kg)  06/13/19 177 lb (80.3 kg)  06/03/19 180 lb (81.6 kg)     GEN: Patient is in no acute distress HEENT: Normal NECK: No JVD; No carotid bruits LYMPHATICS: No lymphadenopathy CARDIAC: Hear sounds regular, 2/6 systolic murmur at the  apex. RESPIRATORY:  Clear to auscultation without rales, wheezing or rhonchi  ABDOMEN: Soft, non-tender, non-distended MUSCULOSKELETAL:  No edema; No deformity  SKIN: Warm and dry NEUROLOGIC:  Alert and oriented x 3 PSYCHIATRIC:  Normal affect   Signed, Jenean Lindau, MD  09/07/2019 2:35 PM    [facility]

## 2019-09-07 NOTE — Patient Instructions (Signed)
Medication Instructions:  Your physician recommends that you continue on your current medications as directed. Please refer to the Current Medication list given to you today.  *If you need a refill on your cardiac medications before your next appointment, please call your pharmacy*   Lab Work: NONE If you have labs (blood work) drawn today and your tests are completely normal, you will receive your results only by:  [place] (if you have MyChart) OR  A paper copy in the mail If you have any lab test that is abnormal or we need to change your treatment, we will call you to review the results.   Testing/Procedures: NONE   Follow-Up: At [facility], you and your health needs are our priority.  As part of our continuing mission to provide you with exceptional heart care, we have created designated Provider Care Teams.  These Care Teams include your primary Cardiologist (physician) and Advanced Practice Providers (APPs -  Physician Assistants and Nurse Practitioners) who all work together to provide you with the care you need, when you need it.  We recommend signing up for the patient portal called "MyChart".  Sign up information is provided on this After Visit Summary.  MyChart is used to connect with patients for Virtual Visits (Telemedicine).  Patients are able to view lab/test results, encounter notes, upcoming appointments, etc.  Non-urgent messages can be sent to your provider as well.   To learn more about what you can do with MyChart, go to [email or web address].    Your next appointment:   6 month(s)  The format for your next appointment:   In Person  Provider:   Jyl Heinz, MD

## 2019-09-15 ENCOUNTER — Other Ambulatory Visit: Payer: Self-pay | Admitting: Medical

## 2019-09-15 ENCOUNTER — Telehealth: Payer: Self-pay | Admitting: Medical

## 2019-09-15 MED ORDER — OXYCODONE HCL 5 MG PO TABS: ORAL_TABLET | ORAL | 0 refills | Status: DC

## 2019-09-15 NOTE — Telephone Encounter (Signed)
She needs update uds, contract and controlled med in office visit. Is she out of meds completely? She can be scheduled for in office visit week of august 16 th.  Let me know if she is out completely. If so can sent in limited short rx such as 10 tabs ending follow up in office.

## 2019-09-15 NOTE — Telephone Encounter (Signed)
Medication: oxyCODONE (OXY IR/ROXICODONE) 5 MG immediate release tablet [[id number]   Has the patient contacted their pharmacy? No. (If no, request that the patient contact the pharmacy for the refill.) (If yes, when and what did the pharmacy advise?)  Preferred Pharmacy (with phone number or street name): [facility]  [place], [place] [address]  Phone:  [phone] Fax:  [phone]  DEA #:  --  Agent: Please be advised that RX refills may take up to 3 business days. We ask that you follow-up with your pharmacy.

## 2019-09-15 NOTE — Telephone Encounter (Signed)
Requesting:oxycodone Contract:06/15/18 UDS:06/04/18 Last Visit:06/06/19 Next Visit:n/a Last Refill:05/10/19  Please Advise

## 2019-09-15 NOTE — Telephone Encounter (Signed)
I sent in 10 tablet rx of oxycodone. Her office visit has to be in office.

## 2019-09-15 NOTE — Telephone Encounter (Signed)
Rx oxycodone sent to pharmacy. Office visit needs to be in office.

## 2019-09-15 NOTE — Telephone Encounter (Signed)
Pt is completely out and her next appt is scheduled for 8/17 with you

## 2019-09-15 NOTE — Addendum Note (Signed)
Addended by: Anabel Halon on: 09/15/2019 02:28 PM   Modules accepted: Orders

## 2019-09-27 ENCOUNTER — Telehealth: Payer: Self-pay

## 2019-09-27 ENCOUNTER — Other Ambulatory Visit: Payer: Self-pay

## 2019-09-27 ENCOUNTER — Telehealth: Payer: 59 | Admitting: Medical

## 2019-09-27 VITALS — BP 126/83 | HR 86 | Resp 18 | Ht 72.0 in | Wt 190.0 lb

## 2019-09-27 DIAGNOSIS — J0100 Acute maxillary sinusitis, unspecified: Secondary | ICD-10-CM | POA: Diagnosis not present

## 2019-09-27 DIAGNOSIS — M545 Low back pain, unspecified: Secondary | ICD-10-CM

## 2019-09-27 DIAGNOSIS — Z79899 Other long term (current) drug therapy: Secondary | ICD-10-CM | POA: Diagnosis not present

## 2019-09-27 DIAGNOSIS — H9202 Otalgia, left ear: Secondary | ICD-10-CM | POA: Diagnosis not present

## 2019-09-27 DIAGNOSIS — Z20822 Contact with and (suspected) exposure to covid-19: Secondary | ICD-10-CM

## 2019-09-27 DIAGNOSIS — F419 Anxiety disorder, unspecified: Secondary | ICD-10-CM

## 2019-09-27 MED ORDER — BENZONATATE 100 MG PO CAPS: 100.0000 mg | ORAL_CAPSULE | Freq: Three times a day (TID) | ORAL | 0 refills | Status: DC | PRN

## 2019-09-27 MED ORDER — METHOCARBAMOL 750 MG PO TABS: 750.0000 mg | ORAL_TABLET | Freq: Three times a day (TID) | ORAL | 0 refills | Status: DC | PRN

## 2019-09-27 MED ORDER — AMOXICILLIN-POT CLAVULANATE 875-125 MG PO TABS: 1.0000 | ORAL_TABLET | Freq: Two times a day (BID) | ORAL | 0 refills | Status: DC

## 2019-09-27 MED ORDER — OXYCODONE HCL 5 MG PO TABS: ORAL_TABLET | ORAL | 0 refills | Status: DC

## 2019-09-27 MED ORDER — ALBUTEROL SULFATE HFA 108 (90 BASE) MCG/ACT IN AERS
2.0000 | INHALATION_SPRAY | Freq: Four times a day (QID) | RESPIRATORY_TRACT | 0 refills | Status: DC | PRN
Start: 2019-09-27 — End: 2019-10-13

## 2019-09-27 MED ORDER — FLUTICASONE PROPIONATE 50 MCG/ACT NA SUSP
2.0000 | Freq: Every day | NASAL | 1 refills | Status: DC
Start: 2019-09-27 — End: 2022-01-13

## 2019-09-27 NOTE — Progress Notes (Signed)
   Subjective:    Patient ID: [name], female    DOB: Sep 18, 1965, 54 y.o.   MRN: [id number]  HPI  Virtual Visit via Video Note  I connected with [name] on 09/27/19 at  4:20 PM EDT by a video enabled telemedicine application and verified that I am speaking with the correct person using two identifiers.  Location: Patient: home Provider: office   I discussed the limitations of evaluation and management by telemedicine and the availability of in person appointments. The patient expressed understanding and agreed to proceed.  History of Present Illness: Pt states recent exposure to sister who had covid. Pt had st last week, ha, nasal congestion, nausea and some cough.   [facility]  but they did not see her due to ha. Then she went to other clinic but she left since felt bad and did not want.  Pt is overall feeling better. She has some sinus pressure, fatigued and left ear pain. No obvious wheezing or sob. Decreased smell but can still taste foods. No altered taste sensation.  [facility] and told both [facility] infected.  Pt given augmentin only for 5 days. Last Friday finished.  Chronic back pain for yars. Pt on oxycodone for pain and also used robaxin as needed.   Pt went to [facility] last week.   Pt has been vaccinated against covid.   Observations/Objective: General-no acute distress, pleasant, oriented. Lungs- on inspection lungs appear unlabored. Neck- no tracheal deviation or jvd on inspection. Neuro- gross motor function appears intact. heent- left maxillary sinus pressure on self palpation.  Assessment and Plan: Patient Instructions   Acute maxillary sinusitis, recurrence not specified with Left ear pain -augmentin antibiotic, benzonatate for cough and flonase for nasal congestion.  Exposure to COVID-19 virus -video visit and days past onset of illness. Covid testing not indicated   Lumbar back pain (chronic back  pain)High risk medication use  -continue with oxycodone and robaxin.  Follow up 10-14 days or sooner if signs/symptoms persist or worsen.   Follow Up Instructions:    I discussed the assessment and treatment plan with the patient. The patient was provided an opportunity to ask questions and all were answered. The patient agreed with the plan and demonstrated an understanding of the instructions.   The patient was advised to call back or seek an in-person evaluation if the symptoms worsen or if the condition fails to improve as anticipated.     Esperanza Richters, PA-C   Review of Systems     Objective:   Physical Exam        Assessment & Plan:

## 2019-09-28 NOTE — Telephone Encounter (Signed)
Opened to write control contract prior video visit.

## 2019-10-03 ENCOUNTER — Telehealth: Payer: Self-pay

## 2019-10-03 NOTE — Telephone Encounter (Signed)
Reviewed urgent care note. Ask pt what day she want to return 24 th or 25 th.

## 2019-10-03 NOTE — Telephone Encounter (Signed)
Pt called requesting a RTW note since she tested negative for Covid.

## 2019-10-04 NOTE — Telephone Encounter (Signed)
Notify pt return to work note sent to pt my chart.

## 2019-10-04 NOTE — Telephone Encounter (Signed)
Return to work 10/04/19

## 2019-10-04 NOTE — Telephone Encounter (Signed)
Pt made aware of letter states she would call back to give [place] her jobs fax #

## 2019-10-06 ENCOUNTER — Other Ambulatory Visit: Payer: Self-pay | Admitting: Medical

## 2019-10-10 ENCOUNTER — Telehealth: Payer: Self-pay | Admitting: Medical

## 2019-10-10 NOTE — Telephone Encounter (Signed)
Medication: busPIRone (BUSPAR) 15 MG tablet [[id number]]    meloxicam (MOBIC) 7.5 MG tablet [[id number]]    Has the patient contacted their pharmacy? No. (If no, request that the patient contact the pharmacy for the refill.) (If yes, when and what did the pharmacy advise?)  Preferred Pharmacy (with phone number or street name): [facility]  [place], [place] [address]  Phone:  [phone] Fax:  [phone]  DEA #:  --  Agent: Please be advised that RX refills may take up to 3 business days. We ask that you follow-up with your pharmacy.

## 2019-10-11 MED ORDER — BUSPIRONE HCL 15 MG PO TABS
15.0000 mg | ORAL_TABLET | Freq: Three times a day (TID) | ORAL | 0 refills | Status: DC
Start: 2019-10-11 — End: 2019-12-22

## 2019-10-11 MED ORDER — MELOXICAM 7.5 MG PO TABS: 7.5000 mg | ORAL_TABLET | Freq: Every day | ORAL | 0 refills | Status: DC

## 2019-10-11 NOTE — Telephone Encounter (Signed)
Rx sent 

## 2019-10-12 ENCOUNTER — Other Ambulatory Visit: Payer: Self-pay | Admitting: Medical

## 2019-11-07 ENCOUNTER — Other Ambulatory Visit: Payer: Self-pay | Admitting: Medical

## 2019-11-11 ENCOUNTER — Telehealth: Payer: Self-pay | Admitting: Medical

## 2019-11-11 NOTE — Telephone Encounter (Addendum)
Requesting: oxycodone Contract:10/05/19 UDS:09/27/19 Last Visit:09/27/19 Next Visit:n/a Last Refill:09/27/19  Please Advise  Pt needs controlled med visit within a month. Refilled med today.  Last visit was acute illness visit so due for controlled med visit.

## 2019-11-11 NOTE — Telephone Encounter (Signed)
Medication: busPIRone (BUSPAR) 15 MG tablet [[id number]] meloxicam (MOBIC) 7.5 MG tablet [[id number]]   oxyCODONE (OXY IR/ROXICODONE) 5 MG immediate release tablet [[id number]]   methocarbamol (ROBAXIN) 750 MG tablet [[id number]]  Has the patient contacted their pharmacy? No. (If no, request that the patient contact the pharmacy for the refill.) (If yes, when and what did the pharmacy advise?)  Preferred Pharmacy (with phone number or street name): [facility], [place] [address]  Phone:  [phone] Fax:  [phone]     Agent: Please be advised that RX refills may take up to 3 business days. We ask that you follow-up with your pharmacy.

## 2019-11-14 MED ORDER — OXYCODONE HCL 5 MG PO TABS
ORAL_TABLET | ORAL | 0 refills | Status: DC
Start: 2019-11-14 — End: 2019-11-30

## 2019-11-15 ENCOUNTER — Other Ambulatory Visit: Payer: Self-pay | Admitting: Medical

## 2019-11-15 ENCOUNTER — Telehealth: Payer: Self-pay

## 2019-11-15 NOTE — Telephone Encounter (Signed)
PA initiated via Covermymeds; KEY: [id number]. PA approved 11/15/2019 to 05/13/2020.

## 2019-11-16 NOTE — Telephone Encounter (Signed)
Sent in refills of robaxin and meloxicam.  Took off naprosyn and tinazadine off her med list. Make sure she is not taking those while on robaxin and meloxicam.

## 2019-11-28 ENCOUNTER — Ambulatory Visit: Payer: 59 | Admitting: Medical

## 2019-11-30 ENCOUNTER — Encounter: Payer: Self-pay | Admitting: Medical

## 2019-11-30 ENCOUNTER — Other Ambulatory Visit: Payer: Self-pay

## 2019-11-30 ENCOUNTER — Ambulatory Visit (INDEPENDENT_AMBULATORY_CARE_PROVIDER_SITE_OTHER): Payer: 59 | Admitting: Medical

## 2019-11-30 VITALS — BP 135/75 | HR 81 | Resp 18 | Ht 72.0 in | Wt 184.0 lb

## 2019-11-30 DIAGNOSIS — E1165 Type 2 diabetes mellitus with hyperglycemia: Secondary | ICD-10-CM | POA: Diagnosis not present

## 2019-11-30 DIAGNOSIS — Z79899 Other long term (current) drug therapy: Secondary | ICD-10-CM

## 2019-11-30 DIAGNOSIS — E782 Mixed hyperlipidemia: Secondary | ICD-10-CM | POA: Diagnosis not present

## 2019-11-30 DIAGNOSIS — Z1239 Encounter for other screening for malignant neoplasm of breast: Secondary | ICD-10-CM

## 2019-11-30 DIAGNOSIS — I10 Essential (primary) hypertension: Secondary | ICD-10-CM | POA: Diagnosis not present

## 2019-11-30 DIAGNOSIS — R944 Abnormal results of kidney function studies: Secondary | ICD-10-CM

## 2019-11-30 DIAGNOSIS — M545 Low back pain, unspecified: Secondary | ICD-10-CM

## 2019-11-30 DIAGNOSIS — Z1231 Encounter for screening mammogram for malignant neoplasm of breast: Secondary | ICD-10-CM

## 2019-11-30 DIAGNOSIS — Z8541 Personal history of malignant neoplasm of cervix uteri: Secondary | ICD-10-CM

## 2019-11-30 MED ORDER — OXYCODONE HCL 5 MG PO TABS
ORAL_TABLET | ORAL | 0 refills | Status: DC
Start: 2019-11-30 — End: 2020-04-25

## 2019-11-30 NOTE — Patient Instructions (Signed)
Patient has severe chronic lower back pain.  Pain still present but overall handling pain adequately with meloxicam, Robaxin and oxycodone.  She is out of oxycodone presently.  Up-to-date on her contract and UDS.  However she was due for controlled medication visit today.  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  For diabetes, she is going to follow-up with endocrinologist.  Last A1c was elevated.  She is going to see endocrinologist later this week.  Asked her to update me on most recent A1c.  Hypertension history.  Today blood pressure check shows good level.  Continue current Coreg.  Decreased GFR in the past.  Will get CMP today to follow kidney function.  History of hyperlipidemia, will get fasting lipid panel today.  On review patient's overdue for screening mammogram.  Order placed today.  Patient does have history of cervical cancer with partial hysterectomy.  She states still has ovaries so referred her to a gynecologist for well woman exam.  On review patient's not had diabetic eye exams will place referral to optometrist.  Follow-up date 4 months or sooner if needed.

## 2019-11-30 NOTE — Progress Notes (Signed)
Subjective:    Patient ID: [name], female    DOB: 02/29/1965, 54 y.o.   MRN: [id number]  HPI  Pt in for follow up.  Pt states still having low back pain. Pt states pain in past radiated from her lumbar spine to left side. But now recent radiating pain down her right leg. No lower ext weakness. No incontinence. Pt is on regimen of meloxicam, robaxin and oxycodone. Pt is working 2 jobs. 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 Less pain but sometimes sitting prolonged periods will cause left glute area pain. Pt is currently on oxycodone.  Pt has htn. She is on amlodipine and coreg. High bp level toay.   Pt has high cholesterol. Pt is on atorvastatin.   Decreased gfr.   Elevated blood sugar in the past. Pt is diabetic. She is seeing endocrinologist.   Pt aware she should schedule her colonsocopy.  Review of Systems  Constitutional: Negative for chills, fatigue and fever.  HENT: Negative for congestion.   Respiratory: Negative for cough, chest tightness, shortness of breath and wheezing.   Cardiovascular: Negative for chest pain and palpitations.  Gastrointestinal: Negative for abdominal pain.  Genitourinary: Negative for dysuria and frequency.  Musculoskeletal: Positive for back pain.  Skin: Negative for rash.  Neurological: Negative for dizziness, weakness and light-headedness.  Hematological: Negative for adenopathy. Does not bruise/bleed easily.  Psychiatric/Behavioral: Negative for behavioral problems and dysphoric mood.    Past Medical History:  Diagnosis Date  . ABDOMINAL PAIN-EPIGASTRIC 09/14/2009   Qualifier: Diagnosis of  By: Trellis Paganini PA-c, [name]   . Acute cystitis 07/07/2017  . ANKLE PAIN, LEFT 12/25/2009   Qualifier: Diagnosis of  By: Amil Amen MD, Benjamine Mola    . Anxiety 01/22/2016  . CAD (coronary artery disease) 07/07/2017  . Cardiac murmur 02/01/2019  . Cardiomyopathy, secondary ([place]) 09/06/2009   Qualifier:  Diagnosis of  By: Arvid Right   Formatting of this note might be different from the original. Overview:  Qualifier: Diagnosis of  By: Arvid Right  . Cardiomyopathy, unspecified ([place]) 09/06/2009   Formatting of this note might be different from the original. Overview:  Overview:  Qualifier: Diagnosis of  By: Arvid Right  Overview:  Overview:  Qualifier: Diagnosis of  By: Arvid Right Formatting of this note might be different from the original. Overview:  Qualifier: Diagnosis of  By: Arvid Right  . Cerebrovascular disease 09/13/2012  . Cervical cancer ([place])    had surgery in 2001.  . CHEST PAIN, ATYPICAL 07/30/2009   Qualifier: Diagnosis of  By: Amil Amen MD, Winona Legato of this note might be different from the original. Overview:  Qualifier: Diagnosis of  By: Amil Amen MD, Benjamine Mola  . Chronic back pain   . Chronic kidney disease, stage III (moderate) ([place]) 05/23/2014  . Chronic pain syndrome 01/23/2016  . Coronary artery disease    30% lesions noted  . Current use of insulin ([place]) 05/23/2014  . Depression   . Diabetes mellitus   . DIABETES MELLITUS, TYPE II, UNCONTROLLED 07/30/2009   Qualifier: Diagnosis of  By: Amil Amen MD, Benjamine Mola    . Diabetes type 2, uncontrolled ([place]) 05/23/2014  . Diabetic gastroparesis associated with type 1 diabetes mellitus ([place]) 11/27/2016  . DIABETIC PERIPHERAL NEUROPATHY 09/21/2009   Qualifier: Diagnosis of  By: Amil Amen MD, Benjamine Mola    . Diabetic peripheral neuropathy ([place]) 01/23/2016  .  Dysuria 12/25/2009   Qualifier: Diagnosis of  By: Amil Amen MD, Benjamine Mola    . Elevation of level of transaminase or lactic acid dehydrogenase (LDH) 09/21/2009   Formatting of this note might be different from the original. Overview:  Qualifier: Diagnosis of  By: Amil Amen MD, Benjamine Mola  . Essential hypertension 08/30/2009   Qualifier: Diagnosis of  By: Lovette Cliche, CNA, Christy    . Gastric  atony 07/30/2009   Formatting of this note might be different from the original. Overview:  Qualifier: Diagnosis of  By: Amil Amen MD, Benjamine Mola  . Gastroesophageal reflux disease 07/30/2009   Formatting of this note might be different from the original. Overview:  Overview:  Qualifier: Diagnosis of  By: Amil Amen MD, Elby Showers: Diagnosis of  By: Chester Holstein NP, Ellwood Handler of this note might be different from the original. Overview:  Qualifier: Diagnosis of  By: Amil Amen MD, Elby Showers: Diagnosis of  By: Chester Holstein NP, Nevin Bloodgood  . Gastroparesis 07/30/2009   Qualifier: Diagnosis of  By: Amil Amen MD, Benjamine Mola    . GERD 07/30/2009   Qualifier: Diagnosis of  By: Amil Amen MD, Elby Showers: Diagnosis of  By: Chester Holstein NP, Nevin Bloodgood    . GERD (gastroesophageal reflux disease)   . History of cervical cancer 08/17/2015   Status post partial hysterectomy  Formatting of this note might be different from the original. Status post partial hysterectomy  . HLD (hyperlipidemia) 07/30/2009   Qualifier: Diagnosis of  By: Amil Amen MD, Benjamine Mola    . Hyperlipidemia   . Hyperlipidemia, unspecified 07/30/2009   Formatting of this note might be different from the original. Overview:  Qualifier: Diagnosis of  By: Amil Amen MD, Winona Legato of this note might be different from the original. Overview:  Qualifier: Diagnosis of  By: Amil Amen MD, Benjamine Mola  . Hypertension   . INSOMNIA 09/21/2009   Qualifier: Diagnosis of  By: Amil Amen MD, Benjamine Mola    . Lumbar facet arthropathy 05/14/2016  . Lumbar spinal stenosis 02/11/2018  . Metatarsalgia of both feet 03/11/2017  . Migraine 09/13/2012   Overview:  IMPRESSION: possible abd migraine  . Mixed dyslipidemia 02/01/2019  . Nausea & vomiting 07/07/2017  . Nausea with vomiting, unspecified 09/14/2009   Qualifier: Diagnosis of  By: Shane Crutch, [name]   Formatting of this note might be different from the original. Overview:  Qualifier: Diagnosis of  By:  Shane Crutch, [name]  . Neuropathy   . Nonspecific abnormal findings on radiological and examination of skull and head 09/13/2012  . Overweight (BMI 25.0-29.9) 07/21/2014  . POSITIVE PPD 07/30/2009   Annotation: CXR clear ?  diagnosed in 2/11?  On INH/pyridoxine Qualifier: Diagnosis of  By: Amil Amen MD, Benjamine Mola    . Pure hypercholesterolemia 11/27/2016  . Retention of urine 06/25/2011  . Right flank pain   . TOBACCO ABUSE 09/21/2009   Qualifier: Diagnosis of  By: Amil Amen MD, Winona Legato of this note might be different from the original. Overview:  Qualifier: Diagnosis of  By: Amil Amen MD, Benjamine Mola  . TRANSAMINASES, SERUM, ELEVATED 09/21/2009   Qualifier: Diagnosis of  By: Amil Amen MD, Benjamine Mola    . Tuberculin test reaction 07/30/2009   Formatting of this note might be different from the original. Overview:  Annotation: CXR clear ?  diagnosed in 2/11?  On INH/pyridoxine Qualifier: Diagnosis of  By: Amil Amen MD, Benjamine Mola  . Type 2 diabetes mellitus with hyperglycemia ([place]) 07/30/2009   Qualifier: Diagnosis of  By: Amil Amen MD, Winona Legato of  this note might be different from the original. Overview:  Qualifier: Diagnosis of  By: Amil Amen MD, Benjamine Mola  . Type 2 diabetes mellitus with hyperglycemia, with long-term current use of insulin ([place]) 07/30/2009   Formatting of this note might be different from the original. Overview:  05/30/2015 A1C was 16.7% (!)  Overview:  05/30/2015 A1C was 16.7% (!) Formatting of this note might be different from the original. 05/30/2015 A1C was 16.7% (!)  . Type 2 diabetes mellitus with stage 3 chronic kidney disease ([place]) 09/14/2009   Qualifier: Diagnosis of  By: Trellis Paganini PA-c, [name]   . ULCER-GASTRIC 09/28/2009   Qualifier: Diagnosis of  By: Chester Holstein NP, Nevin Bloodgood    . URINARY TRACT INFECTION 09/21/2009   Qualifier: Diagnosis of  By: Amil Amen MD, Benjamine Mola    . VAGINITIS, CANDIDAL 12/25/2009   Qualifier: Diagnosis of  By: Amil Amen MD, Benjamine Mola    .  Vitamin D deficiency 04/25/2013     Social History   Socioeconomic History  . Marital status: Single    Spouse name: Not on file  . Number of children: Not on file  . Years of education: Not on file  . Highest education level: Not on file  Occupational History  . Not on file  Tobacco Use  . Smoking status: Never Smoker  . Smokeless tobacco: Never Used  Vaping Use  . Vaping Use: Never used  Substance and Sexual Activity  . Alcohol use: Yes    Comment: occ  . Drug use: No  . Sexual activity: Not on file  Other Topics Concern  . Not on file  Social History Narrative  . Not on file   Social Determinants of Health   Financial Resource Strain:   . Difficulty of Paying Living Expenses: Not on file  Food Insecurity:   . Worried About Charity fundraiser in the Last Year: Not on file  . Ran Out of Food in the Last Year: Not on file  Transportation Needs:   . Lack of Transportation (Medical): Not on file  . Lack of Transportation (Non-Medical): Not on file  Physical Activity:   . Days of Exercise per Week: Not on file  . Minutes of Exercise per Session: Not on file  Stress:   . Feeling of Stress : Not on file  Social Connections:   . Frequency of Communication with Friends and Family: Not on file  . Frequency of Social Gatherings with Friends and Family: Not on file  . Attends Religious Services: Not on file  . Active Member of Clubs or Organizations: Not on file  . Attends Archivist Meetings: Not on file  . Marital Status: Not on file  Intimate Partner Violence:   . Fear of Current or Ex-Partner: Not on file  . Emotionally Abused: Not on file  . Physically Abused: Not on file  . Sexually Abused: Not on file    Past Surgical History:  Procedure Laterality Date  . ABDOMINAL HYSTERECTOMY     partial  . CHOLECYSTECTOMY      Family History  Problem Relation Age of Onset  . Diabetes Mother   . Diabetes Father     Allergies  Allergen Reactions  .  Insulin Aspart Other (See Comments) and Swelling    adverse reaction to Novolog   . Latex Itching and Rash  . Morphine Itching and Rash    Current Outpatient Medications on File Prior to Visit  Medication Sig Dispense Refill  . albuterol (VENTOLIN HFA)  108 (90 Base) MCG/ACT inhaler INHALE 2 PUFFS BY MOUTH EVERY 6 HOURS AS NEEDED 18 g 0  . Albuterol Sulfate (PROAIR RESPICLICK) 627 (90 Base) MCG/ACT AEPB Inhale 2 puffs into the lungs at bedtime as needed. 90 each 2  . amLODipine (NORVASC) 5 MG tablet Take 1 tablet by mouth once daily 90 tablet 0  . aspirin 81 MG tablet Take 81 mg by mouth daily.    Marland Kitchen atorvastatin (LIPITOR) 80 MG tablet Take 1 tablet (80 mg total) by mouth daily. 30 tablet 3  . busPIRone (BUSPAR) 15 MG tablet Take 1 tablet (15 mg total) by mouth 3 (three) times daily. 21 tablet 0  . carvedilol (COREG) 25 MG tablet Take 1 tablet (25 mg total) by mouth 2 (two) times daily with a meal. 180 tablet 1  . Cholecalciferol (VITAMIN D3) 400 units CAPS Take 400 mg by mouth daily.    Marland Kitchen FARXIGA 10 MG TABS tablet Take 1 tablet by mouth once daily 90 tablet 0  . fluticasone (FLONASE) 50 MCG/ACT nasal spray Place 2 sprays into both nostrils daily. 16 g 1  . fluticasone (FLONASE) 50 MCG/ACT nasal spray Place 2 sprays into both nostrils daily. 16 g 1  . fluticasone furoate-vilanterol (BREO ELLIPTA) 100-25 MCG/INH AEPB Inhale 1 puff into the lungs daily. 100 each 2  . gabapentin (NEURONTIN) 300 MG capsule Take 2 capsules by mouth 3 (three) times daily.    . insulin degludec (TRESIBA FLEXTOUCH) 100 UNIT/ML SOPN FlexTouch Pen Inject 40 Units into the skin daily at 10 pm.    . meloxicam (MOBIC) 7.5 MG tablet TAKE 1 TO 2 TABLETS BY MOUTH ONCE DAILY 30 tablet 0  . metFORMIN (GLUCOPHAGE-XR) 500 MG 24 hr tablet 2 (two) times daily.    Marland Kitchen spironolactone (ALDACTONE) 25 MG tablet Take 1 tablet by mouth once daily 30 tablet 0  . amoxicillin-clavulanate (AUGMENTIN) 875-125 MG tablet Take 1 tablet by mouth 2  (two) times daily. (Patient not taking: Reported on 11/30/2019) 10 tablet 0  . benzonatate (TESSALON) 100 MG capsule Take 1 capsule by mouth three times daily as needed (Patient not taking: Reported on 11/30/2019) 30 capsule 0  . methocarbamol (ROBAXIN) 750 MG tablet TAKE 1 TABLET BY MOUTH EVERY 8 HOURS AS NEEDED (Patient not taking: Reported on 11/30/2019) 15 tablet 0  . neomycin-polymyxin-hydrocortisone (CORTISPORIN) OTIC solution Place 3 drops into the left ear 4 (four) times daily. (Patient not taking: Reported on 11/30/2019) 10 mL 0  . nitroGLYCERIN (NITROSTAT) 0.4 MG SL tablet Place 1 tablet (0.4 mg total) under the tongue every 5 (five) minutes as needed. 25 tablet 3  . polyethylene glycol (MIRALAX / GLYCOLAX) packet Take 17 g by mouth daily. (Patient not taking: Reported on 11/30/2019) 14 each 0  . promethazine (PHENERGAN) 12.5 MG tablet TAKE 1 TABLET BY MOUTH EVERY 8 HOURS AS NEEDED FOR  NAUSEA  OR  VOMITING (Patient not taking: Reported on 11/30/2019) 20 tablet 0   No current facility-administered medications on file prior to visit.    BP 135/75   Pulse 81   Resp 18   Ht 6' (1.829 m)   Wt 184 lb (83.5 kg)   SpO2 98%   BMI 24.95 kg/m      Objective:   Physical Exam  General Appearance- Not in acute distress.    Chest and Lung Exam Auscultation: Breath sounds:-Normal. Clear even and unlabored. Adventitious sounds:- No Adventitious sounds.  Cardiovascular Auscultation:Rythm - Regular, rate and rythm. Heart Sounds -Normal heart sounds.  Abdomen Inspection:-Inspection Normal.  Palpation/Perucssion: Palpation and Percussion of the abdomen reveal- Non Tender, No Rebound tenderness, No rigidity(Guarding) and No Palpable abdominal masses.  Liver:-Normal.  Spleen:- Normal.   Back Mid lumbar spine tenderness to palpation. Pain on straight leg lift. Pain on lateral movements and flexion/extension of the spine.  Lower ext neurologic  L5-S1 sensation intact  bilaterally. Normal patellar reflexes bilaterally. No foot drop bilaterally.      Assessment & Plan:  Patient has severe chronic lower back pain.  Pain still present but overall handling pain adequately with meloxicam, Robaxin and oxycodone.  She is out of oxycodone presently.  Up-to-date on her contract and UDS.  However she was due for controlled medication visit today.  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  For diabetes, she is going to follow-up with endocrinologist.  Last A1c was elevated.  She is going to see endocrinologist later this week.  Asked her to update me on most recent A1c.  Hypertension history.  Today blood pressure check shows good level.  Continue current Coreg.  Decreased GFR in the past.  Will get CMP today to follow kidney function.  History of hyperlipidemia, will get fasting lipid panel today.  On review patient's overdue for screening mammogram.  Order placed today.  Patient does have history of cervical cancer with partial hysterectomy.  She states still has ovaries so referred her to a gynecologist for well woman exam.  On review patient's not had diabetic eye exams will place referral to optometrist.  Follow-up date 4 months or sooner if needed.  Time spent with patient today was 40  minutes which consisted of chart revdiew, discussing varius  Diagnoses, including controlled med visit, work up, treatment and documentation.

## 2019-12-01 LAB — CBC WITH DIFFERENTIAL/PLATELET
Absolute Monocytes: 297 cells/uL (ref 200–950)
Basophils Absolute: 22 cells/uL (ref 0–200)
Basophils Relative: 0.4 %
Eosinophils Absolute: 375 cells/uL (ref 15–500)
Eosinophils Relative: 6.7 %
HCT: 44.4 % (ref 35.0–45.0)
Hemoglobin: 14.6 g/dL (ref 11.7–15.5)
Lymphs Abs: 2526 cells/uL (ref 850–3900)
MCH: 29.7 pg (ref 27.0–33.0)
MCHC: 32.9 g/dL (ref 32.0–36.0)
MCV: 90.4 fL (ref 80.0–100.0)
MPV: 10.9 fL (ref 7.5–12.5)
Monocytes Relative: 5.3 %
Neutro Abs: 2380 cells/uL (ref 1500–7800)
Neutrophils Relative %: 42.5 %
Platelets: 294 10*3/uL (ref 140–400)
RBC: 4.91 10*6/uL (ref 3.80–5.10)
RDW: 13.4 % (ref 11.0–15.0)
Total Lymphocyte: 45.1 %
WBC: 5.6 10*3/uL (ref 3.8–10.8)

## 2019-12-01 LAB — COMPREHENSIVE METABOLIC PANEL
AG Ratio: 1.4 (calc) (ref 1.0–2.5)
ALT: 21 U/L (ref 6–29)
AST: 23 U/L (ref 10–35)
Albumin: 4.9 g/dL (ref 3.6–5.1)
Alkaline phosphatase (APISO): 117 U/L (ref 37–153)
BUN: 15 mg/dL (ref 7–25)
CO2: 27 mmol/L (ref 20–32)
Calcium: 10 mg/dL (ref 8.6–10.4)
Chloride: 98 mmol/L (ref 98–110)
Creat: 1.05 mg/dL (ref 0.50–1.05)
Globulin: 3.6 g/dL (calc) (ref 1.9–3.7)
Glucose, Bld: 158 mg/dL — ABNORMAL HIGH (ref 65–99)
Potassium: 4.3 mmol/L (ref 3.5–5.3)
Sodium: 137 mmol/L (ref 135–146)
Total Bilirubin: 0.5 mg/dL (ref 0.2–1.2)
Total Protein: 8.5 g/dL — ABNORMAL HIGH (ref 6.1–8.1)

## 2019-12-01 LAB — LIPID PANEL
Cholesterol: 170 mg/dL (ref <200)
HDL: 61 mg/dL (ref >=50)
LDL Cholesterol (Calc): 90 mg/dL (calc)
Non-HDL Cholesterol (Calc): 109 mg/dL (calc) (ref <130)
Total CHOL/HDL Ratio: 2.8 (calc) (ref <5.0)
Triglycerides: 91 mg/dL (ref <150)

## 2019-12-02 ENCOUNTER — Telehealth: Payer: Self-pay

## 2019-12-02 NOTE — Telephone Encounter (Signed)
Patient wants refill on muscle relaxer( robaxin and meloxicam)

## 2019-12-03 ENCOUNTER — Telehealth: Payer: Self-pay | Admitting: Medical

## 2019-12-03 MED ORDER — MELOXICAM 7.5 MG PO TABS: ORAL_TABLET | ORAL | 0 refills | Status: DC

## 2019-12-03 MED ORDER — METHOCARBAMOL 750 MG PO TABS: 750.0000 mg | ORAL_TABLET | Freq: Three times a day (TID) | ORAL | 0 refills | Status: DC | PRN

## 2019-12-03 NOTE — Telephone Encounter (Signed)
Rx robaxin and meloxicam sent to pt pharmacy.

## 2019-12-18 ENCOUNTER — Other Ambulatory Visit: Payer: Self-pay | Admitting: Medical

## 2019-12-19 NOTE — Telephone Encounter (Signed)
Pt is asking for robaxin frequently. I don't want to prescribe any more than 15 tabs a month. I don't think good idea to use three times daily continuously. So can prescribe her 15 tabs a month to use on as needed basis.

## 2019-12-20 ENCOUNTER — Telehealth (INDEPENDENT_AMBULATORY_CARE_PROVIDER_SITE_OTHER): Payer: 59 | Admitting: Medical

## 2019-12-20 ENCOUNTER — Other Ambulatory Visit: Payer: 59

## 2019-12-20 ENCOUNTER — Other Ambulatory Visit: Payer: Self-pay

## 2019-12-20 DIAGNOSIS — R102 Pelvic and perineal pain: Secondary | ICD-10-CM | POA: Diagnosis not present

## 2019-12-20 DIAGNOSIS — R8290 Unspecified abnormal findings in urine: Secondary | ICD-10-CM

## 2019-12-20 DIAGNOSIS — R109 Unspecified abdominal pain: Secondary | ICD-10-CM

## 2019-12-20 MED ORDER — NITROFURANTOIN MONOHYD MACRO 100 MG PO CAPS: 100.0000 mg | ORAL_CAPSULE | Freq: Two times a day (BID) | ORAL | 0 refills | Status: DC

## 2019-12-20 NOTE — Progress Notes (Signed)
° °  Subjective:    Patient ID: [name], female    DOB: [birth year], 54 y.o.   MRN: [id number]  HPI  Virtual Visit via Video Note  I connected with [name] on 12/20/19 at  3:00 PM EST by a video enabled telemedicine application and verified that I am speaking with the correct person using two identifiers.  Location: Patient: home Provider: office  Particpants- pt and myself.   I discussed the limitations of evaluation and management by telemedicine and the availability of in person appointments. The patient expressed understanding and agreed to proceed.  History of Present Illness:  Pt states on Thursday she started to get abdomen pain. Mild at first and gradually got worse.   Some pain after eating but then started to think uti. Rare heart burn but some in past. Used to use omeprazole.  Pt speculates that maybe uti. Some bilateral back pain. Pt thought some odor to urine. States smells same as when had uti. Some nausea.  Patient describing over the last couple days pain more in the region directly over her bladder.  She states reminds her of the last time she had urinary tract infection.   Observations/Objective: General-no acute distress, pleasant, oriented. Lungs- on inspection lungs appear unlabored. Neck- no tracheal deviation or jvd on inspection. Neuro- gross motor function appears intact. Abdomen- pt reports suprapubic region pain.  Back -bilateral lower back pain.  Assessment and Plan: Recent description of lower suprapubic region pain, odor to urine and lower back pain.  Some upper abdomen pain described initially before explaining seeing more lower abdomen/upper bladder most recently.  Asked that she come to our office to give urine sample and will send urine out for culture.  Also will get CBC, CMP and lipase.  During the interim pending final lab results will prescribe Macrobid antibiotic and advised to hydrate well.  Also eat bland diet and  restart your omeprazole.  If signs symptoms worsen or change let us know.  Follow-up in 7 days or as needed.  Follow Up Instructions:    I discussed the assessment and treatment plan with the patient. The patient was provided an opportunity to ask questions and all were answered. The patient agreed with the plan and demonstrated an understanding of the instructions.   The patient was advised to call back or seek an in-person evaluation if the symptoms worsen or if the condition fails to improve as anticipated.  Time spent with patient today was 25  minutes which consisted of chart revdiew, discussing diagnosis, work up treatment and documentation.   Mackie Pai, PA-C    Review of Systems  Constitutional: Negative for chills, fatigue and fever.  Respiratory: Negative for cough, chest tightness and shortness of breath.   Cardiovascular: Negative for palpitations.  Gastrointestinal: Negative for abdominal distention, blood in stool, constipation, diarrhea and vomiting.  Genitourinary:       Odor to urine and suprapubic region pain.  Musculoskeletal: Positive for back pain.  Neurological: Negative for dizziness and headaches.  Hematological: Negative for adenopathy. Does not bruise/bleed easily.  Psychiatric/Behavioral: Negative for confusion.       Objective:   Physical Exam        Assessment & Plan:

## 2019-12-20 NOTE — Patient Instructions (Signed)
Recent description of lower suprapubic region pain, odor to urine and lower back pain.  Some upper abdomen pain described initially before explaining seeing more lower abdomen/upper bladder most recently.  Asked that she come to our office to give urine sample and will send urine out for culture.  Also will get CBC, CMP and lipase.  During the interim pending final lab results will prescribe Macrobid antibiotic and advised to hydrate well.  Also eat bland diet and restart your omeprazole.  If signs symptoms worsen or change let us know.  Follow-up in 7 days or as needed.

## 2019-12-22 ENCOUNTER — Telehealth: Payer: Self-pay | Admitting: Medical

## 2019-12-22 MED ORDER — BUSPIRONE HCL 15 MG PO TABS
15.0000 mg | ORAL_TABLET | Freq: Three times a day (TID) | ORAL | 0 refills | Status: DC
Start: 2019-12-22 — End: 2020-02-06

## 2019-12-22 MED ORDER — PROMETHAZINE HCL 12.5 MG PO TABS
ORAL_TABLET | ORAL | 0 refills | Status: DC
Start: 2019-12-22 — End: 2020-04-05

## 2019-12-22 NOTE — Telephone Encounter (Signed)
Rx sent 

## 2019-12-22 NOTE — Telephone Encounter (Signed)
Pt came in office stating is needing refill on promethazine (PHENERGAN) 12.5 MG tablet and busPIRone (BUSPAR) 15 MG tablet  Send to [facility], [address]  [place], [place] [address]  Phone:  [phone] Fax:  [phone] .   Please advise.

## 2019-12-26 ENCOUNTER — Other Ambulatory Visit: Payer: 59

## 2020-01-02 ENCOUNTER — Other Ambulatory Visit: Payer: Self-pay | Admitting: Medical

## 2020-01-04 ENCOUNTER — Encounter: Payer: Self-pay | Admitting: Family Medicine

## 2020-01-04 ENCOUNTER — Encounter: Payer: 59 | Admitting: Family Medicine

## 2020-01-04 NOTE — Progress Notes (Signed)
Patient did not keep appointment today. She may call to reschedule.  

## 2020-01-10 ENCOUNTER — Telehealth (INDEPENDENT_AMBULATORY_CARE_PROVIDER_SITE_OTHER): Payer: 59 | Admitting: Internal Medicine

## 2020-01-10 ENCOUNTER — Telehealth: Payer: Self-pay | Admitting: Unknown Physician Specialty

## 2020-01-10 ENCOUNTER — Encounter: Payer: Self-pay | Admitting: Internal Medicine

## 2020-01-10 ENCOUNTER — Encounter: Payer: Self-pay | Admitting: Unknown Physician Specialty

## 2020-01-10 VITALS — Ht 72.0 in | Wt 178.0 lb

## 2020-01-10 DIAGNOSIS — U071 COVID-19: Secondary | ICD-10-CM

## 2020-01-10 NOTE — Telephone Encounter (Signed)
I connected by phone with [name] on 01/10/2020 at 4:34 PM to discuss the potential use of a new treatment for mild to moderate COVID-19 viral infection in non-hospitalized patients.  This patient is a 54 y.o. female that meets the FDA criteria for Emergency Use Authorization of COVID monoclonal antibody casirivimab/imdevimab, bamlanivimab/eteseviamb, or sotrovimab.  Has a (+) direct SARS-CoV-2 viral test result  Has mild or moderate COVID-19   Is NOT hospitalized due to COVID-19  Is within 10 days of symptom onset  Has at least one of the high risk factor(s) for progression to severe COVID-19 and/or hospitalization as defined in EUA.  Specific high risk criteria : BMI > 25 and Diabetes   I have spoken and communicated the following to the patient or parent/caregiver regarding COVID monoclonal antibody treatment:  1. FDA has authorized the emergency use for the treatment of mild to moderate COVID-19 in adults and pediatric patients with positive results of direct SARS-CoV-2 viral testing who are 71 years of age and older weighing at least 40 kg, and who are at high risk for progressing to severe COVID-19 and/or hospitalization.  2. The significant known and potential risks and benefits of COVID monoclonal antibody, and the extent to which such potential risks and benefits are unknown.  3. Information on available alternative treatments and the risks and benefits of those alternatives, including clinical trials.  4. Patients treated with COVID monoclonal antibody should continue to self-isolate and use infection control measures (e.g., wear mask, isolate, social distance, avoid sharing personal items, clean and disinfect "high touch" surfaces, and frequent handwashing) according to CDC guidelines.   5. The patient or parent/caregiver has the option to accept or refuse COVID monoclonal antibody treatment.  After reviewing this information with the patient, the patient has agreed  to receive one of the available covid 19 monoclonal antibodies and will be provided an appropriate fact sheet prior to infusion. Kathrine Haddock, NP 01/10/2020 4:34 PM  Sx onset 11/23

## 2020-01-10 NOTE — Progress Notes (Signed)
Subjective:    Patient ID: [name], female    DOB: 1965-11-17, 54 y.o.   MRN: [id number]  DOS:  01/10/2020 Type of visit - description: Virtual Visit via Video Note  I connected with the above patient  by a video enabled telemedicine application and verified that I am speaking with the correct person using two identifiers.   THIS ENCOUNTER IS A VIRTUAL VISIT DUE TO COVID-19 - PATIENT WAS NOT SEEN IN THE OFFICE. PATIENT HAS CONSENTED TO VIRTUAL VISIT / TELEMEDICINE VISIT   Location of patient: home  Location of provider: office  Persons participating in the virtual visit: patient, provider   I discussed the limitations of evaluation and management by telemedicine and the availability of in person appointments. The patient expressed understanding and agreed to proceed.  Acute She started to feel unwell 01/03/2020, went to be tested for Covid and it was positive. The next day she started to feel worse: Nausea, vomiting.  Malaise, some dizziness.  Some cough with yellow-colored sputum production.. I ask about fluid intake and she states is drinking plenty of fluids and the urine output is okay.  Denies fevers, admits to a mild headache.  Has a history of asthma, no major exacerbation at this point. No difficulty breathing, initially denied chest pain but then she said she has some discomfort on the left lateral chest.   Review of Systems See above   Past Medical History:  Diagnosis Date  . ABDOMINAL PAIN-EPIGASTRIC 09/14/2009   Qualifier: Diagnosis of  By: Trellis Paganini PA-c, [name]   . Acute cystitis 07/07/2017  . ANKLE PAIN, LEFT 12/25/2009   Qualifier: Diagnosis of  By: Amil Amen MD, Benjamine Mola    . Anxiety 01/22/2016  . CAD (coronary artery disease) 07/07/2017  . Cardiac murmur 02/01/2019  . Cardiomyopathy, secondary ([place]) 09/06/2009   Qualifier: Diagnosis of  By: Arvid Right   Formatting of this note might be different from the original. Overview:   Qualifier: Diagnosis of  By: Arvid Right  . Cardiomyopathy, unspecified ([place]) 09/06/2009   Formatting of this note might be different from the original. Overview:  Overview:  Qualifier: Diagnosis of  By: Arvid Right  Overview:  Overview:  Qualifier: Diagnosis of  By: Arvid Right Formatting of this note might be different from the original. Overview:  Qualifier: Diagnosis of  By: Arvid Right  . Cerebrovascular disease 09/13/2012  . Cervical cancer ([place])    had surgery in 2001.  . CHEST PAIN, ATYPICAL 07/30/2009   Qualifier: Diagnosis of  By: Amil Amen MD, Winona Legato of this note might be different from the original. Overview:  Qualifier: Diagnosis of  By: Amil Amen MD, Benjamine Mola  . Chronic back pain   . Chronic kidney disease, stage III (moderate) ([place]) 05/23/2014  . Chronic pain syndrome 01/23/2016  . Coronary artery disease    30% lesions noted  . Current use of insulin ([place]) 05/23/2014  . Depression   . Diabetes mellitus   . DIABETES MELLITUS, TYPE II, UNCONTROLLED 07/30/2009   Qualifier: Diagnosis of  By: Amil Amen MD, Benjamine Mola    . Diabetes type 2, uncontrolled ([place]) 05/23/2014  . Diabetic gastroparesis associated with type 1 diabetes mellitus ([place]) 11/27/2016  . DIABETIC PERIPHERAL NEUROPATHY 09/21/2009   Qualifier: Diagnosis of  By: Amil Amen MD, Benjamine Mola    . Diabetic peripheral neuropathy ([place]) 01/23/2016  . Dysuria 12/25/2009   Qualifier: Diagnosis of  By: Amil Amen MD, Benjamine Mola    .  Elevation of level of transaminase or lactic acid dehydrogenase (LDH) 09/21/2009   Formatting of this note might be different from the original. Overview:  Qualifier: Diagnosis of  By: Amil Amen MD, Benjamine Mola  . Essential hypertension 08/30/2009   Qualifier: Diagnosis of  By: Lovette Cliche, CNA, Christy    . Gastric atony 07/30/2009   Formatting of this note might be different from the original. Overview:  Qualifier: Diagnosis of  By:  Amil Amen MD, Benjamine Mola  . Gastroesophageal reflux disease 07/30/2009   Formatting of this note might be different from the original. Overview:  Overview:  Qualifier: Diagnosis of  By: Amil Amen MD, Elby Showers: Diagnosis of  By: Chester Holstein NP, Ellwood Handler of this note might be different from the original. Overview:  Qualifier: Diagnosis of  By: Amil Amen MD, Elby Showers: Diagnosis of  By: Chester Holstein NP, Nevin Bloodgood  . Gastroparesis 07/30/2009   Qualifier: Diagnosis of  By: Amil Amen MD, Benjamine Mola    . GERD 07/30/2009   Qualifier: Diagnosis of  By: Amil Amen MD, Elby Showers: Diagnosis of  By: Chester Holstein NP, Nevin Bloodgood    . GERD (gastroesophageal reflux disease)   . History of cervical cancer 08/17/2015   Status post partial hysterectomy  Formatting of this note might be different from the original. Status post partial hysterectomy  . HLD (hyperlipidemia) 07/30/2009   Qualifier: Diagnosis of  By: Amil Amen MD, Benjamine Mola    . Hyperlipidemia   . Hyperlipidemia, unspecified 07/30/2009   Formatting of this note might be different from the original. Overview:  Qualifier: Diagnosis of  By: Amil Amen MD, Winona Legato of this note might be different from the original. Overview:  Qualifier: Diagnosis of  By: Amil Amen MD, Benjamine Mola  . Hypertension   . INSOMNIA 09/21/2009   Qualifier: Diagnosis of  By: Amil Amen MD, Benjamine Mola    . Lumbar facet arthropathy 05/14/2016  . Lumbar spinal stenosis 02/11/2018  . Metatarsalgia of both feet 03/11/2017  . Migraine 09/13/2012   Overview:  IMPRESSION: possible abd migraine  . Mixed dyslipidemia 02/01/2019  . Nausea & vomiting 07/07/2017  . Nausea with vomiting, unspecified 09/14/2009   Qualifier: Diagnosis of  By: Shane Crutch, [name]   Formatting of this note might be different from the original. Overview:  Qualifier: Diagnosis of  By: Shane Crutch, [name]  . Neuropathy   . Nonspecific abnormal findings on radiological and examination of skull and head  09/13/2012  . Overweight (BMI 25.0-29.9) 07/21/2014  . POSITIVE PPD 07/30/2009   Annotation: CXR clear ?  diagnosed in 2/11?  On INH/pyridoxine Qualifier: Diagnosis of  By: Amil Amen MD, Benjamine Mola    . Pure hypercholesterolemia 11/27/2016  . Retention of urine 06/25/2011  . Right flank pain   . TOBACCO ABUSE 09/21/2009   Qualifier: Diagnosis of  By: Amil Amen MD, Winona Legato of this note might be different from the original. Overview:  Qualifier: Diagnosis of  By: Amil Amen MD, Benjamine Mola  . TRANSAMINASES, SERUM, ELEVATED 09/21/2009   Qualifier: Diagnosis of  By: Amil Amen MD, Benjamine Mola    . Tuberculin test reaction 07/30/2009   Formatting of this note might be different from the original. Overview:  Annotation: CXR clear ?  diagnosed in 2/11?  On INH/pyridoxine Qualifier: Diagnosis of  By: Amil Amen MD, Benjamine Mola  . Type 2 diabetes mellitus with hyperglycemia ([place]) 07/30/2009   Qualifier: Diagnosis of  By: Amil Amen MD, Winona Legato of this note might be different from the original. Overview:  Qualifier: Diagnosis of  By: Amil Amen  MD, Benjamine Mola  . Type 2 diabetes mellitus with hyperglycemia, with long-term current use of insulin ([place]) 07/30/2009   Formatting of this note might be different from the original. Overview:  05/30/2015 A1C was 16.7% (!)  Overview:  05/30/2015 A1C was 16.7% (!) Formatting of this note might be different from the original. 05/30/2015 A1C was 16.7% (!)  . Type 2 diabetes mellitus with stage 3 chronic kidney disease ([place]) 09/14/2009   Qualifier: Diagnosis of  By: Trellis Paganini PA-c, [name]   . ULCER-GASTRIC 09/28/2009   Qualifier: Diagnosis of  By: Chester Holstein NP, Nevin Bloodgood    . URINARY TRACT INFECTION 09/21/2009   Qualifier: Diagnosis of  By: Amil Amen MD, Benjamine Mola    . VAGINITIS, CANDIDAL 12/25/2009   Qualifier: Diagnosis of  By: Amil Amen MD, Benjamine Mola    . Vitamin D deficiency 04/25/2013    Past Surgical History:  Procedure Laterality Date  . ABDOMINAL HYSTERECTOMY      partial  . CHOLECYSTECTOMY      Allergies as of 01/10/2020      Reactions   Insulin Aspart Other (See Comments), Swelling   adverse reaction to Novolog   Latex Itching, Rash   Morphine Itching, Rash      Medication List       Accurate as of January 10, 2020 11:59 PM. If you have any questions, ask your nurse or doctor.        STOP taking these medications   amoxicillin-clavulanate 875-125 MG tablet Commonly known as: Augmentin Stopped by: Kathlene November, MD   nitrofurantoin (macrocrystal-monohydrate) 100 MG capsule Commonly known as: Macrobid Stopped by: Kathlene November, MD     TAKE these medications   amLODipine 5 MG tablet Commonly known as: NORVASC Take 1 tablet by mouth once daily   aspirin 81 MG tablet Take 81 mg by mouth daily.   atorvastatin 80 MG tablet Commonly known as: LIPITOR Take 1 tablet (80 mg total) by mouth daily.   benzonatate 100 MG capsule Commonly known as: TESSALON Take 1 capsule by mouth three times daily as needed   Breo Ellipta 100-25 MCG/INH Aepb Generic drug: fluticasone furoate-vilanterol Inhale 1 puff into the lungs daily.   busPIRone 15 MG tablet Commonly known as: BUSPAR Take 1 tablet (15 mg total) by mouth 3 (three) times daily.   carvedilol 25 MG tablet Commonly known as: COREG Take 1 tablet (25 mg total) by mouth 2 (two) times daily with a meal.   Farxiga 10 MG Tabs tablet Generic drug: dapagliflozin propanediol Take 1 tablet by mouth once daily   fluticasone 50 MCG/ACT nasal spray Commonly known as: FLONASE Place 2 sprays into both nostrils daily. What changed: Another medication with the same name was removed. Continue taking this medication, and follow the directions you see here. Changed by: Kathlene November, MD   gabapentin 300 MG capsule Commonly known as: NEURONTIN Take 2 capsules by mouth 3 (three) times daily.   meloxicam 7.5 MG tablet Commonly known as: MOBIC TAKE 1 TO 2 TABLETS BY MOUTH ONCE DAILY   metFORMIN 500 MG  24 hr tablet Commonly known as: GLUCOPHAGE-XR 2 (two) times daily.   methocarbamol 750 MG tablet Commonly known as: ROBAXIN TAKE 1 TABLET BY MOUTH EVERY 8 HOURS AS NEEDED   neomycin-polymyxin-hydrocortisone OTIC solution Commonly known as: CORTISPORIN Place 3 drops into the left ear 4 (four) times daily.   nitroGLYCERIN 0.4 MG SL tablet Commonly known as: NITROSTAT Place 1 tablet (0.4 mg total) under the tongue every 5 (five) minutes as  needed.   oxyCODONE 5 MG immediate release tablet Commonly known as: Oxy IR/ROXICODONE 1 tab po twice daily as needed for severe pain   polyethylene glycol 17 g packet Commonly known as: MIRALAX / GLYCOLAX Take 17 g by mouth daily.   ProAir RespiClick 932 (90 Base) MCG/ACT Aepb Generic drug: Albuterol Sulfate Inhale 2 puffs into the lungs at bedtime as needed.   albuterol 108 (90 Base) MCG/ACT inhaler Commonly known as: VENTOLIN HFA INHALE 2 PUFFS BY MOUTH EVERY 6 HOURS AS NEEDED   promethazine 12.5 MG tablet Commonly known as: PHENERGAN TAKE 1 TABLET BY MOUTH EVERY 8 HOURS AS NEEDED FOR  NAUSEA  OR  VOMITING   spironolactone 25 MG tablet Commonly known as: ALDACTONE Take 1 tablet by mouth once daily   Tresiba FlexTouch 100 UNIT/ML FlexTouch Pen Generic drug: insulin degludec Inject 40 Units into the skin daily at 10 pm.   Vitamin D3 10 MCG (400 UNIT) Caps Take 400 mg by mouth daily.          Objective:   Physical Exam Ht 6' (1.829 m)   Wt 178 lb (80.7 kg)   BMI 24.14 kg/m  This is a virtual video visit, no vital signs available, no O2 sat available.  She looks very tired but nontoxic.  Speaking in complete sentences, no labored breathing.     Assessment      54 year old female, PMH includesDiabetes, heart disease, HTN, asthma, presents with:  COVID-19: Developed a viral syndrome few days ago, with the onset of symptoms she tested positive for Covid. Evaluation is limited as I don't have a set  vital signs or  o2sat. On exam she looks tired but is in no distress. She had 2 Covid vaccination but has multiple risk factors. Plan: Refer her to the infusion center, until then >>  ER if severe symptoms. Encouraged to push fluids, continue her regular medications and watch blood sugars.     I discussed the assessment and treatment plan with the patient. The patient was provided an opportunity to ask questions and all were answered. The patient agreed with the plan and demonstrated an understanding of the instructions.   The patient was advised to call back or seek an in-person evaluation if the symptoms worsen or if the condition fails to improve as anticipated.

## 2020-01-11 ENCOUNTER — Ambulatory Visit (HOSPITAL_COMMUNITY)
Admission: RE | Admit: 2020-01-11 | Discharge: 2020-01-11 | Disposition: A | Discharge status: Home / Self Care | Payer: 59 | Source: Ambulatory Visit | Attending: Pulmonary Disease | Admitting: Pulmonary Disease

## 2020-01-11 ENCOUNTER — Other Ambulatory Visit: Payer: Self-pay | Admitting: Physician Assistant

## 2020-01-11 DIAGNOSIS — N183 Chronic kidney disease, stage 3 unspecified: Secondary | ICD-10-CM

## 2020-01-11 DIAGNOSIS — I2510 Atherosclerotic heart disease of native coronary artery without angina pectoris: Secondary | ICD-10-CM

## 2020-01-11 DIAGNOSIS — U071 COVID-19: Secondary | ICD-10-CM | POA: Insufficient documentation

## 2020-01-11 DIAGNOSIS — E119 Type 2 diabetes mellitus without complications: Secondary | ICD-10-CM

## 2020-01-11 MED ORDER — ONDANSETRON HCL 4 MG/2ML IJ SOLN
4.0000 mg | Freq: Once | INTRAMUSCULAR | Status: AC
  Administered 2020-01-11: 4 mg via INTRAVENOUS
  Filled 2020-01-11: qty 2

## 2020-01-11 MED ORDER — EPINEPHRINE 0.3 MG/0.3ML IJ SOAJ: 0.3000 mg | Freq: Once | INTRAMUSCULAR | Status: DC | PRN

## 2020-01-11 MED ORDER — ALBUTEROL SULFATE HFA 108 (90 BASE) MCG/ACT IN AERS: 2.0000 | INHALATION_SPRAY | Freq: Once | RESPIRATORY_TRACT | Status: DC | PRN

## 2020-01-11 MED ORDER — DIPHENHYDRAMINE HCL 50 MG/ML IJ SOLN: 50.0000 mg | Freq: Once | INTRAMUSCULAR | Status: DC | PRN

## 2020-01-11 MED ORDER — SOTROVIMAB 500 MG/8ML IV SOLN
500.0000 mg | Freq: Once | INTRAVENOUS | Status: AC
  Administered 2020-01-11: 500 mg via INTRAVENOUS

## 2020-01-11 MED ORDER — FAMOTIDINE IN NACL 20-0.9 MG/50ML-% IV SOLN: 20.0000 mg | Freq: Once | INTRAVENOUS | Status: DC | PRN

## 2020-01-11 MED ORDER — SODIUM CHLORIDE 0.9 % IV SOLN: INTRAVENOUS | Status: DC | PRN

## 2020-01-11 MED ORDER — METHYLPREDNISOLONE SODIUM SUCC 125 MG IJ SOLR: 125.0000 mg | Freq: Once | INTRAMUSCULAR | Status: DC | PRN

## 2020-01-11 NOTE — Progress Notes (Signed)
Patient reviewed Fact Sheet for Patients, Parents, and Caregivers for Emergency Use Authorization (EUA) of Sotrovimab for the Treatment of Coronavirus. Patient also reviewed and is agreeable to the estimated cost of treatment. Patient is agreeable to proceed.   

## 2020-01-11 NOTE — Progress Notes (Signed)
Diagnosis: COVID-19  Physician: Dr. Patrick Wright  Procedure: [facility] Infusion Clinic Med: Sotrovimab infusion - Provided patient with sotrovimab fact sheet for patients, parents, and caregivers prior to infusion.   Complications: No immediate complications noted  Discharge: Discharged home    

## 2020-01-11 NOTE — Progress Notes (Signed)
I connected by phone with [name] on 01/11/2020 at 12:54 PM to discuss the potential use of a new treatment for mild to moderate COVID-19 viral infection in non-hospitalized patients.  This patient is a 54 y.o. female that meets the FDA criteria for Emergency Use Authorization of COVID monoclonal antibody casirivimab/imdevimab, bamlanivimab/eteseviamb, or sotrovimab.  Has a (+) direct SARS-CoV-2 viral test result  Has mild or moderate COVID-19   Is NOT hospitalized due to COVID-19  Is within 10 days of symptom onset  Has at least one of the high risk factor(s) for progression to severe COVID-19 and/or hospitalization as defined in EUA.  Specific high risk criteria : BMI > 25, Chronic Kidney Disease (CKD), Diabetes and Cardiovascular disease or hypertension   I have spoken and communicated the following to the patient or parent/caregiver regarding COVID monoclonal antibody treatment:  1. FDA has authorized the emergency use for the treatment of mild to moderate COVID-19 in adults and pediatric patients with positive results of direct SARS-CoV-2 viral testing who are 69 years of age and older weighing at least 40 kg, and who are at high risk for progressing to severe COVID-19 and/or hospitalization.  2. The significant known and potential risks and benefits of COVID monoclonal antibody, and the extent to which such potential risks and benefits are unknown.  3. Information on available alternative treatments and the risks and benefits of those alternatives, including clinical trials.  4. Patients treated with COVID monoclonal antibody should continue to self-isolate and use infection control measures (e.g., wear mask, isolate, social distance, avoid sharing personal items, clean and disinfect "high touch" surfaces, and frequent handwashing) according to CDC guidelines.   5. The patient or parent/caregiver has the option to accept or refuse COVID monoclonal antibody  treatment.  After reviewing this information with the patient, the patient has agreed to receive one of the available covid 19 monoclonal antibodies and will be provided an appropriate fact sheet prior to infusion.   [name], [place] 01/11/2020 12:54 PM

## 2020-01-11 NOTE — Discharge Instructions (Signed)
10 Things You Can Do to Manage Your COVID-19 Symptoms at Home If you have possible or confirmed COVID-19: 1. Stay home from work and school. And stay away from other public places. If you must go out, avoid using any kind of public transportation, ridesharing, or taxis. 2. Monitor your symptoms carefully. If your symptoms get worse, call your healthcare provider immediately. 3. Get rest and stay hydrated. 4. If you have a medical appointment, call the healthcare provider ahead of time and tell them that you have or may have COVID-19. 5. For medical emergencies, call 911 and notify the dispatch personnel that you have or may have COVID-19. 6. Cover your cough and sneezes with a tissue or use the inside of your elbow. 7. Wash your hands often with soap and water for at least 20 seconds or clean your hands with an alcohol-based hand sanitizer that contains at least 60% alcohol. 8. As much as possible, stay in a specific room and away from other people in your home. Also, you should use a separate bathroom, if available. If you need to be around other people in or outside of the home, wear a mask. 9. Avoid sharing personal items with other people in your household, like dishes, towels, and bedding. 10. Clean all surfaces that are touched often, like counters, tabletops, and doorknobs. Use household cleaning sprays or wipes according to the label instructions. cdc.gov/coronavirus 08/11/2018 This information is not intended to replace advice given to you by your health care provider. Make sure you discuss any questions you have with your health care provider. Document Revised: 01/13/2019 Document Reviewed: 01/13/2019 Elsevier Patient Education  2020 Elsevier Inc. What types of side effects do monoclonal antibody drugs cause?  Common side effects  In general, the more common side effects caused by monoclonal antibody drugs include: . Allergic reactions, such as hives or itching . Flu-like signs and  symptoms, including chills, fatigue, fever, and muscle aches and pains . Nausea, vomiting . Diarrhea . Skin rashes . Low blood pressure   The CDC is recommending patients who receive monoclonal antibody treatments wait at least 90 days before being vaccinated.  Currently, there are no data on the safety and efficacy of mRNA COVID-19 vaccines in persons who received monoclonal antibodies or convalescent plasma as part of COVID-19 treatment. Based on the estimated half-life of such therapies as well as evidence suggesting that reinfection is uncommon in the 90 days after initial infection, vaccination should be deferred for at least 90 days, as a precautionary measure until additional information becomes available, to avoid interference of the antibody treatment with vaccine-induced immune responses. If you have any questions or concerns after the infusion please call the Advanced Practice Provider on call at [phone]. This number is ONLY intended for your use regarding questions or concerns about the infusion post-treatment side-effects.  Please do not provide this number to others for use. For return to work notes please contact your primary care provider.   If someone you know is interested in receiving treatment please have them call the COVID hotline at [phone].   

## 2020-01-18 ENCOUNTER — Other Ambulatory Visit: Payer: Self-pay

## 2020-01-18 ENCOUNTER — Telehealth (INDEPENDENT_AMBULATORY_CARE_PROVIDER_SITE_OTHER): Payer: 59 | Admitting: Medical

## 2020-01-18 DIAGNOSIS — U071 COVID-19: Secondary | ICD-10-CM | POA: Diagnosis not present

## 2020-01-18 DIAGNOSIS — R059 Cough, unspecified: Secondary | ICD-10-CM

## 2020-01-18 MED ORDER — BENZONATATE 100 MG PO CAPS: 100.0000 mg | ORAL_CAPSULE | Freq: Three times a day (TID) | ORAL | 0 refills | Status: DC | PRN

## 2020-01-18 NOTE — Progress Notes (Signed)
Subjective:    Patient ID: [name], female    DOB: 1965-11-09, 54 y.o.   MRN: [id number]  HPI Virtual Visit via Telephone Note  I connected with [name] on 01/18/20 at  1:20 PM EST by telephone and verified that I am speaking with the correct person using two identifiers.  Location: Patient: home Provider: office  Video visit failed connnection. Had to do visit by phone.  Pt did not check her vitals. Does not have o2 sat.   I discussed the limitations, risks, security and privacy concerns of performing an evaluation and management service by telephone and the availability of in person appointments. I also discussed with the patient that there may be a patient responsible charge related to this service. The patient expressed understanding and agreed to proceed.   History of Present Illness:  Pt had covid. Saw Dr Larose Kells by video visit. Pt states overall feels better. Has nasal congestion, residual cough, mild fatigue and slight short of breath on ambulation short distance. No calf pain. No swelling. No pain behind.  Pt does not have 02 sat monitor. Pt did get infusion treatment and states Monday started to feel the best.      Observations/Objective: General-no acute distress, pleasant, alert and normal speech.  Assessment and Plan: Recent Covid infection with monoclonal antibody treatment.  Presently described overall much improved but residual symptoms of mild cough, nasal congestion, mild fatigue and some mild shortness of breath on ambulation.  Recommend continue vitamin D, albuterol inhaler and sent benzonatate cough tab Rx.  Do recommend that you get O2 sat monitor and check percentage.  96 and above would be a good number.  Less than 94 abdomen.  If numbers less than 94 please send me a MyChart message.  Placed future order chest x-ray to get done on Saturday morning.  Follow-up date to be determined after x-ray review and update for you on how you are  doing clinically.  Return to work date pending presently as well.  Follow Up Instructions:    I discussed the assessment and treatment plan with the patient. The patient was provided an opportunity to ask questions and all were answered. The patient agreed with the plan and demonstrated an understanding of the instructions.   The patient was advised to call back or seek an in-person evaluation if the symptoms worsen or if the condition fails to improve as anticipated.  Time spent with patient today was 20  minutes which consisted of chart rediew, discussing diagnosis, work up treatment and documentation.  Mackie Pai, PA-C    Review of Systems  Constitutional: Positive for fatigue. Negative for chills and fever.  HENT:       Sense of smell decreased.  Respiratory: Positive for cough, shortness of breath and wheezing. Negative for choking and chest tightness.        Rare cough. See hpi.  Wheezing is much better.  Cardiovascular: Negative for chest pain and palpitations.  Gastrointestinal: Negative for abdominal pain.  Endocrine: Negative for polydipsia and polyuria.  Genitourinary: Negative for dysuria.  Musculoskeletal: Negative for back pain and myalgias.  Neurological: Negative for dizziness and headaches.  Hematological: Negative for adenopathy. Does not bruise/bleed easily.  Psychiatric/Behavioral: Negative for behavioral problems.    Past Medical History:  Diagnosis Date  . ABDOMINAL PAIN-EPIGASTRIC 09/14/2009   Qualifier: Diagnosis of  By: Trellis Paganini PA-c, [name]   . Acute cystitis 07/07/2017  . ANKLE PAIN, LEFT 12/25/2009   Qualifier: Diagnosis  of  By: Amil Amen MD, Benjamine Mola    . Anxiety 01/22/2016  . CAD (coronary artery disease) 07/07/2017  . Cardiac murmur 02/01/2019  . Cardiomyopathy, secondary ([place]) 09/06/2009   Qualifier: Diagnosis of  By: Arvid Right   Formatting of this note might be different from the original. Overview:  Qualifier: Diagnosis  of  By: Arvid Right  . Cardiomyopathy, unspecified ([place]) 09/06/2009   Formatting of this note might be different from the original. Overview:  Overview:  Qualifier: Diagnosis of  By: Arvid Right  Overview:  Overview:  Qualifier: Diagnosis of  By: Arvid Right Formatting of this note might be different from the original. Overview:  Qualifier: Diagnosis of  By: Arvid Right  . Cerebrovascular disease 09/13/2012  . Cervical cancer ([place])    had surgery in 2001.  . CHEST PAIN, ATYPICAL 07/30/2009   Qualifier: Diagnosis of  By: Amil Amen MD, Winona Legato of this note might be different from the original. Overview:  Qualifier: Diagnosis of  By: Amil Amen MD, Benjamine Mola  . Chronic back pain   . Chronic kidney disease, stage III (moderate) ([place]) 05/23/2014  . Chronic pain syndrome 01/23/2016  . Coronary artery disease    30% lesions noted  . Current use of insulin ([place]) 05/23/2014  . Depression   . Diabetes mellitus   . DIABETES MELLITUS, TYPE II, UNCONTROLLED 07/30/2009   Qualifier: Diagnosis of  By: Amil Amen MD, Benjamine Mola    . Diabetes type 2, uncontrolled ([place]) 05/23/2014  . Diabetic gastroparesis associated with type 1 diabetes mellitus ([place]) 11/27/2016  . DIABETIC PERIPHERAL NEUROPATHY 09/21/2009   Qualifier: Diagnosis of  By: Amil Amen MD, Benjamine Mola    . Diabetic peripheral neuropathy ([place]) 01/23/2016  . Dysuria 12/25/2009   Qualifier: Diagnosis of  By: Amil Amen MD, Benjamine Mola    . Elevation of level of transaminase or lactic acid dehydrogenase (LDH) 09/21/2009   Formatting of this note might be different from the original. Overview:  Qualifier: Diagnosis of  By: Amil Amen MD, Benjamine Mola  . Essential hypertension 08/30/2009   Qualifier: Diagnosis of  By: Lovette Cliche, CNA, Christy    . Gastric atony 07/30/2009   Formatting of this note might be different from the original. Overview:  Qualifier: Diagnosis of  By: Amil Amen MD, Benjamine Mola   . Gastroesophageal reflux disease 07/30/2009   Formatting of this note might be different from the original. Overview:  Overview:  Qualifier: Diagnosis of  By: Amil Amen MD, Elby Showers: Diagnosis of  By: Chester Holstein NP, Ellwood Handler of this note might be different from the original. Overview:  Qualifier: Diagnosis of  By: Amil Amen MD, Elby Showers: Diagnosis of  By: Chester Holstein NP, Nevin Bloodgood  . Gastroparesis 07/30/2009   Qualifier: Diagnosis of  By: Amil Amen MD, Benjamine Mola    . GERD 07/30/2009   Qualifier: Diagnosis of  By: Amil Amen MD, Elby Showers: Diagnosis of  By: Chester Holstein NP, Nevin Bloodgood    . GERD (gastroesophageal reflux disease)   . History of cervical cancer 08/17/2015   Status post partial hysterectomy  Formatting of this note might be different from the original. Status post partial hysterectomy  . HLD (hyperlipidemia) 07/30/2009   Qualifier: Diagnosis of  By: Amil Amen MD, Benjamine Mola    . Hyperlipidemia   . Hyperlipidemia, unspecified 07/30/2009   Formatting of this note might be different from the original. Overview:  Qualifier: Diagnosis of  By: Amil Amen MD, Winona Legato of this note might be different  from the original. Overview:  Qualifier: Diagnosis of  By: Amil Amen MD, Benjamine Mola  . Hypertension   . INSOMNIA 09/21/2009   Qualifier: Diagnosis of  By: Amil Amen MD, Benjamine Mola    . Lumbar facet arthropathy 05/14/2016  . Lumbar spinal stenosis 02/11/2018  . Metatarsalgia of both feet 03/11/2017  . Migraine 09/13/2012   Overview:  IMPRESSION: possible abd migraine  . Mixed dyslipidemia 02/01/2019  . Nausea & vomiting 07/07/2017  . Nausea with vomiting, unspecified 09/14/2009   Qualifier: Diagnosis of  By: Shane Crutch, [name]   Formatting of this note might be different from the original. Overview:  Qualifier: Diagnosis of  By: Shane Crutch, [name]  . Neuropathy   . Nonspecific abnormal findings on radiological and examination of skull and head 09/13/2012  . Overweight (BMI  25.0-29.9) 07/21/2014  . POSITIVE PPD 07/30/2009   Annotation: CXR clear ?  diagnosed in 2/11?  On INH/pyridoxine Qualifier: Diagnosis of  By: Amil Amen MD, Benjamine Mola    . Pure hypercholesterolemia 11/27/2016  . Retention of urine 06/25/2011  . Right flank pain   . TOBACCO ABUSE 09/21/2009   Qualifier: Diagnosis of  By: Amil Amen MD, Winona Legato of this note might be different from the original. Overview:  Qualifier: Diagnosis of  By: Amil Amen MD, Benjamine Mola  . TRANSAMINASES, SERUM, ELEVATED 09/21/2009   Qualifier: Diagnosis of  By: Amil Amen MD, Benjamine Mola    . Tuberculin test reaction 07/30/2009   Formatting of this note might be different from the original. Overview:  Annotation: CXR clear ?  diagnosed in 2/11?  On INH/pyridoxine Qualifier: Diagnosis of  By: Amil Amen MD, Benjamine Mola  . Type 2 diabetes mellitus with hyperglycemia ([place]) 07/30/2009   Qualifier: Diagnosis of  By: Amil Amen MD, Winona Legato of this note might be different from the original. Overview:  Qualifier: Diagnosis of  By: Amil Amen MD, Benjamine Mola  . Type 2 diabetes mellitus with hyperglycemia, with long-term current use of insulin ([place]) 07/30/2009   Formatting of this note might be different from the original. Overview:  05/30/2015 A1C was 16.7% (!)  Overview:  05/30/2015 A1C was 16.7% (!) Formatting of this note might be different from the original. 05/30/2015 A1C was 16.7% (!)  . Type 2 diabetes mellitus with stage 3 chronic kidney disease ([place]) 09/14/2009   Qualifier: Diagnosis of  By: Trellis Paganini PA-c, [name]   . ULCER-GASTRIC 09/28/2009   Qualifier: Diagnosis of  By: Chester Holstein NP, Nevin Bloodgood    . URINARY TRACT INFECTION 09/21/2009   Qualifier: Diagnosis of  By: Amil Amen MD, Benjamine Mola    . VAGINITIS, CANDIDAL 12/25/2009   Qualifier: Diagnosis of  By: Amil Amen MD, Benjamine Mola    . Vitamin D deficiency 04/25/2013     Social History   Socioeconomic History  . Marital status: Single    Spouse name: Not on file  . Number of  children: Not on file  . Years of education: Not on file  . Highest education level: Not on file  Occupational History  . Not on file  Tobacco Use  . Smoking status: Never Smoker  . Smokeless tobacco: Never Used  Vaping Use  . Vaping Use: Never used  Substance and Sexual Activity  . Alcohol use: Yes    Comment: occ  . Drug use: No  . Sexual activity: Not on file  Other Topics Concern  . Not on file  Social History Narrative  . Not on file   Social Determinants of Health   Financial Resource Strain:   .  Difficulty of Paying Living Expenses: Not on file  Food Insecurity:   . Worried About Charity fundraiser in the Last Year: Not on file  . Ran Out of Food in the Last Year: Not on file  Transportation Needs:   . Lack of Transportation (Medical): Not on file  . Lack of Transportation (Non-Medical): Not on file  Physical Activity:   . Days of Exercise per Week: Not on file  . Minutes of Exercise per Session: Not on file  Stress:   . Feeling of Stress : Not on file  Social Connections:   . Frequency of Communication with Friends and Family: Not on file  . Frequency of Social Gatherings with Friends and Family: Not on file  . Attends Religious Services: Not on file  . Active Member of Clubs or Organizations: Not on file  . Attends Archivist Meetings: Not on file  . Marital Status: Not on file  Intimate Partner Violence:   . Fear of Current or Ex-Partner: Not on file  . Emotionally Abused: Not on file  . Physically Abused: Not on file  . Sexually Abused: Not on file    Past Surgical History:  Procedure Laterality Date  . ABDOMINAL HYSTERECTOMY     partial  . CHOLECYSTECTOMY      Family History  Problem Relation Age of Onset  . Diabetes Mother   . Diabetes Father     Allergies  Allergen Reactions  . Insulin Aspart Other (See Comments) and Swelling    adverse reaction to Novolog   . Latex Itching and Rash  . Morphine Itching and Rash    Current  Outpatient Medications on File Prior to Visit  Medication Sig Dispense Refill  . albuterol (VENTOLIN HFA) 108 (90 Base) MCG/ACT inhaler INHALE 2 PUFFS BY MOUTH EVERY 6 HOURS AS NEEDED 18 g 0  . Albuterol Sulfate (PROAIR RESPICLICK) 950 (90 Base) MCG/ACT AEPB Inhale 2 puffs into the lungs at bedtime as needed. 90 each 2  . aspirin 81 MG tablet Take 81 mg by mouth daily.    Marland Kitchen atorvastatin (LIPITOR) 80 MG tablet Take 1 tablet (80 mg total) by mouth daily. 30 tablet 3  . busPIRone (BUSPAR) 15 MG tablet Take 1 tablet (15 mg total) by mouth 3 (three) times daily. 21 tablet 0  . carvedilol (COREG) 25 MG tablet Take 1 tablet (25 mg total) by mouth 2 (two) times daily with a meal. 180 tablet 1  . Cholecalciferol (VITAMIN D3) 400 units CAPS Take 400 mg by mouth daily.    Marland Kitchen FARXIGA 10 MG TABS tablet Take 1 tablet by mouth once daily 90 tablet 0  . fluticasone (FLONASE) 50 MCG/ACT nasal spray Place 2 sprays into both nostrils daily. 16 g 1  . fluticasone furoate-vilanterol (BREO ELLIPTA) 100-25 MCG/INH AEPB Inhale 1 puff into the lungs daily. 100 each 2  . gabapentin (NEURONTIN) 300 MG capsule Take 2 capsules by mouth 3 (three) times daily.    . insulin degludec (TRESIBA FLEXTOUCH) 100 UNIT/ML SOPN FlexTouch Pen Inject 40 Units into the skin daily at 10 pm.    . meloxicam (MOBIC) 7.5 MG tablet TAKE 1 TO 2 TABLETS BY MOUTH ONCE DAILY 30 tablet 0  . metFORMIN (GLUCOPHAGE-XR) 500 MG 24 hr tablet 2 (two) times daily.    . methocarbamol (ROBAXIN) 750 MG tablet TAKE 1 TABLET BY MOUTH EVERY 8 HOURS AS NEEDED 15 tablet 0  . neomycin-polymyxin-hydrocortisone (CORTISPORIN) OTIC solution Place 3 drops into the  left ear 4 (four) times daily. 10 mL 0  . spironolactone (ALDACTONE) 25 MG tablet Take 1 tablet by mouth once daily 30 tablet 0  . amLODipine (NORVASC) 5 MG tablet Take 1 tablet by mouth once daily (Patient not taking: Reported on 12/20/2019) 90 tablet 0  . benzonatate (TESSALON) 100 MG capsule Take 1 capsule by  mouth three times daily as needed (Patient not taking: Reported on 11/30/2019) 30 capsule 0  . nitroGLYCERIN (NITROSTAT) 0.4 MG SL tablet Place 1 tablet (0.4 mg total) under the tongue every 5 (five) minutes as needed. (Patient not taking: Reported on 01/10/2020) 25 tablet 3  . oxyCODONE (OXY IR/ROXICODONE) 5 MG immediate release tablet 1 tab po twice daily as needed for severe pain (Patient not taking: Reported on 01/10/2020) 60 tablet 0  . polyethylene glycol (MIRALAX / GLYCOLAX) packet Take 17 g by mouth daily. (Patient not taking: Reported on 11/30/2019) 14 each 0  . promethazine (PHENERGAN) 12.5 MG tablet TAKE 1 TABLET BY MOUTH EVERY 8 HOURS AS NEEDED FOR  NAUSEA  OR  VOMITING (Patient not taking: Reported on 01/10/2020) 20 tablet 0   No current facility-administered medications on file prior to visit.    There were no vitals taken for this visit.      Objective:   Physical Exam  General- no acute distress, pleasant, alert and speaking normal.      Assessment & Plan:

## 2020-01-18 NOTE — Patient Instructions (Addendum)
Recent Covid infection with monoclonal antibody treatment.  Presently described overall much improved but residual symptoms of mild cough, nasal congestion, mild fatigue and some mild shortness of breath on ambulation.  Recommend continue vitamin D, albuterol inhaler and sent benzonatate cough tab Rx.  Do recommend that you get O2 sat monitor and check percentage.  96 and above would be a good number.  Less than 94 abdomen.  If numbers less than 94 please send me a MyChart message.  Placed future order chest x-ray to get done on Saturday morning.  Follow-up date to be determined after x-ray review and update for you on how you are doing clinically.  Return to work date pending presently as well.

## 2020-01-21 ENCOUNTER — Ambulatory Visit (HOSPITAL_BASED_OUTPATIENT_CLINIC_OR_DEPARTMENT_OTHER)
Admission: RE | Admit: 2020-01-21 | Discharge: 2020-01-21 | Disposition: A | Discharge status: Home / Self Care | Payer: 59 | Source: Ambulatory Visit | Attending: Medical | Admitting: Medical

## 2020-01-21 ENCOUNTER — Other Ambulatory Visit: Payer: Self-pay

## 2020-01-21 DIAGNOSIS — R059 Cough, unspecified: Secondary | ICD-10-CM | POA: Insufficient documentation

## 2020-01-23 ENCOUNTER — Telehealth: Payer: Self-pay | Admitting: Medical

## 2020-01-23 NOTE — Telephone Encounter (Signed)
Patient asking that she needs a note to return back to work tomorrow. Please upload to [employer]

## 2020-01-23 NOTE — Telephone Encounter (Signed)
Return to work note sent to pt mychart

## 2020-01-24 NOTE — Telephone Encounter (Signed)
Pt.notified

## 2020-02-01 ENCOUNTER — Other Ambulatory Visit: Payer: Self-pay | Admitting: Medical

## 2020-02-02 ENCOUNTER — Other Ambulatory Visit: Payer: Self-pay | Admitting: Medical

## 2020-03-16 ENCOUNTER — Other Ambulatory Visit: Payer: Self-pay | Admitting: Medical

## 2020-04-03 ENCOUNTER — Other Ambulatory Visit: Payer: Self-pay | Admitting: Medical

## 2020-04-04 NOTE — Telephone Encounter (Signed)
Signing off encounter.  

## 2020-04-05 ENCOUNTER — Other Ambulatory Visit: Payer: Self-pay | Admitting: Medical

## 2020-04-09 ENCOUNTER — Encounter: Payer: Self-pay | Admitting: Internal Medicine

## 2020-04-09 ENCOUNTER — Telehealth (INDEPENDENT_AMBULATORY_CARE_PROVIDER_SITE_OTHER): Payer: 59 | Admitting: Internal Medicine

## 2020-04-09 VITALS — Ht 72.0 in | Wt 189.0 lb

## 2020-04-09 DIAGNOSIS — R519 Headache, unspecified: Secondary | ICD-10-CM | POA: Diagnosis not present

## 2020-04-09 MED ORDER — AZELASTINE HCL 0.1 % NA SOLN: 2.0000 | Freq: Two times a day (BID) | NASAL | 1 refills | Status: DC

## 2020-04-09 MED ORDER — AMOXICILLIN-POT CLAVULANATE 875-125 MG PO TABS: 1.0000 | ORAL_TABLET | Freq: Two times a day (BID) | ORAL | 0 refills | Status: DC

## 2020-04-09 NOTE — Progress Notes (Signed)
Subjective:    Patient ID: [name], female    DOB: 1965/12/04, 55 y.o.   MRN: [id number]  DOS:  04/09/2020 Type of visit - description: Virtual Visit via Video Note  I connected with the above patient  by a video enabled telemedicine application and verified that I am speaking with the correct person using two identifiers.   THIS ENCOUNTER IS A VIRTUAL VISIT DUE TO COVID-19 - PATIENT WAS NOT SEEN IN THE OFFICE. PATIENT HAS CONSENTED TO VIRTUAL VISIT / TELEMEDICINE VISIT   Location of patient: home  Location of provider: office  Persons participating in the virtual visit: patient, provider   I discussed the limitations of evaluation and management by telemedicine and the availability of in person appointments. The patient expressed understanding and agreed to proceed.  Acute Symptoms started yesterday: Noted some swelling at the left cheek and she felt she was getting a cold. Also developed left ear ache but no ear discharge. Has taken some NyQuil and today the swelling is less noticeable. + Headache, located at the left side of the head and neck but no posterior  neck pain per se. She is concerned about this being sinusitis.  Denies fever chills No rash anywhere in the face + Nausea since this started. + Runny nose which is not uncommon for her. Denies visual disturbances, no eye pain with movement, no eyeball pain per se.  Eyes are watery, a chronic issue. No cough    Review of Systems See above   Past Medical History:  Diagnosis Date  . ABDOMINAL PAIN-EPIGASTRIC 09/14/2009   Qualifier: Diagnosis of  By: Trellis Paganini PA-c, [name]   . Acute cystitis 07/07/2017  . ANKLE PAIN, LEFT 12/25/2009   Qualifier: Diagnosis of  By: Amil Amen MD, Benjamine Mola    . Anxiety 01/22/2016  . CAD (coronary artery disease) 07/07/2017  . Cardiac murmur 02/01/2019  . Cardiomyopathy, secondary ([place]) 09/06/2009   Qualifier: Diagnosis of  By: Arvid Right   Formatting of  this note might be different from the original. Overview:  Qualifier: Diagnosis of  By: Arvid Right  . Cardiomyopathy, unspecified ([place]) 09/06/2009   Formatting of this note might be different from the original. Overview:  Overview:  Qualifier: Diagnosis of  By: Arvid Right  Overview:  Overview:  Qualifier: Diagnosis of  By: Arvid Right Formatting of this note might be different from the original. Overview:  Qualifier: Diagnosis of  By: Arvid Right  . Cerebrovascular disease 09/13/2012  . Cervical cancer ([place])    had surgery in 2001.  . CHEST PAIN, ATYPICAL 07/30/2009   Qualifier: Diagnosis of  By: Amil Amen MD, Winona Legato of this note might be different from the original. Overview:  Qualifier: Diagnosis of  By: Amil Amen MD, Benjamine Mola  . Chronic back pain   . Chronic kidney disease, stage III (moderate) ([place]) 05/23/2014  . Chronic pain syndrome 01/23/2016  . Coronary artery disease    30% lesions noted  . Current use of insulin ([place]) 05/23/2014  . Depression   . Diabetes mellitus   . DIABETES MELLITUS, TYPE II, UNCONTROLLED 07/30/2009   Qualifier: Diagnosis of  By: Amil Amen MD, Benjamine Mola    . Diabetes type 2, uncontrolled ([place]) 05/23/2014  . Diabetic gastroparesis associated with type 1 diabetes mellitus ([place]) 11/27/2016  . DIABETIC PERIPHERAL NEUROPATHY 09/21/2009   Qualifier: Diagnosis of  By: Amil Amen MD, Benjamine Mola    . Diabetic peripheral neuropathy ([place])  01/23/2016  . Dysuria 12/25/2009   Qualifier: Diagnosis of  By: Amil Amen MD, Benjamine Mola    . Elevation of level of transaminase or lactic acid dehydrogenase (LDH) 09/21/2009   Formatting of this note might be different from the original. Overview:  Qualifier: Diagnosis of  By: Amil Amen MD, Benjamine Mola  . Essential hypertension 08/30/2009   Qualifier: Diagnosis of  By: Lovette Cliche, CNA, Christy    . Gastric atony 07/30/2009   Formatting of this note might be different  from the original. Overview:  Qualifier: Diagnosis of  By: Amil Amen MD, Benjamine Mola  . Gastroesophageal reflux disease 07/30/2009   Formatting of this note might be different from the original. Overview:  Overview:  Qualifier: Diagnosis of  By: Amil Amen MD, Elby Showers: Diagnosis of  By: Chester Holstein NP, Ellwood Handler of this note might be different from the original. Overview:  Qualifier: Diagnosis of  By: Amil Amen MD, Elby Showers: Diagnosis of  By: Chester Holstein NP, Nevin Bloodgood  . Gastroparesis 07/30/2009   Qualifier: Diagnosis of  By: Amil Amen MD, Benjamine Mola    . GERD 07/30/2009   Qualifier: Diagnosis of  By: Amil Amen MD, Elby Showers: Diagnosis of  By: Chester Holstein NP, Nevin Bloodgood    . GERD (gastroesophageal reflux disease)   . History of cervical cancer 08/17/2015   Status post partial hysterectomy  Formatting of this note might be different from the original. Status post partial hysterectomy  . HLD (hyperlipidemia) 07/30/2009   Qualifier: Diagnosis of  By: Amil Amen MD, Benjamine Mola    . Hyperlipidemia   . Hyperlipidemia, unspecified 07/30/2009   Formatting of this note might be different from the original. Overview:  Qualifier: Diagnosis of  By: Amil Amen MD, Winona Legato of this note might be different from the original. Overview:  Qualifier: Diagnosis of  By: Amil Amen MD, Benjamine Mola  . Hypertension   . INSOMNIA 09/21/2009   Qualifier: Diagnosis of  By: Amil Amen MD, Benjamine Mola    . Lumbar facet arthropathy 05/14/2016  . Lumbar spinal stenosis 02/11/2018  . Metatarsalgia of both feet 03/11/2017  . Migraine 09/13/2012   Overview:  IMPRESSION: possible abd migraine  . Mixed dyslipidemia 02/01/2019  . Nausea & vomiting 07/07/2017  . Nausea with vomiting, unspecified 09/14/2009   Qualifier: Diagnosis of  By: Shane Crutch, [name]   Formatting of this note might be different from the original. Overview:  Qualifier: Diagnosis of  By: Shane Crutch, [name]  . Neuropathy   . Nonspecific abnormal  findings on radiological and examination of skull and head 09/13/2012  . Overweight (BMI 25.0-29.9) 07/21/2014  . POSITIVE PPD 07/30/2009   Annotation: CXR clear ?  diagnosed in 2/11?  On INH/pyridoxine Qualifier: Diagnosis of  By: Amil Amen MD, Benjamine Mola    . Pure hypercholesterolemia 11/27/2016  . Retention of urine 06/25/2011  . Right flank pain   . TOBACCO ABUSE 09/21/2009   Qualifier: Diagnosis of  By: Amil Amen MD, Winona Legato of this note might be different from the original. Overview:  Qualifier: Diagnosis of  By: Amil Amen MD, Benjamine Mola  . TRANSAMINASES, SERUM, ELEVATED 09/21/2009   Qualifier: Diagnosis of  By: Amil Amen MD, Benjamine Mola    . Tuberculin test reaction 07/30/2009   Formatting of this note might be different from the original. Overview:  Annotation: CXR clear ?  diagnosed in 2/11?  On INH/pyridoxine Qualifier: Diagnosis of  By: Amil Amen MD, Benjamine Mola  . Type 2 diabetes mellitus with hyperglycemia ([place]) 07/30/2009   Qualifier: Diagnosis of  By: Amil Amen MD, Benjamine Mola  Formatting of this note might be different from the original. Overview:  Qualifier: Diagnosis of  By: Amil Amen MD, Benjamine Mola  . Type 2 diabetes mellitus with hyperglycemia, with long-term current use of insulin ([place]) 07/30/2009   Formatting of this note might be different from the original. Overview:  05/30/2015 A1C was 16.7% (!)  Overview:  05/30/2015 A1C was 16.7% (!) Formatting of this note might be different from the original. 05/30/2015 A1C was 16.7% (!)  . Type 2 diabetes mellitus with stage 3 chronic kidney disease ([place]) 09/14/2009   Qualifier: Diagnosis of  By: Trellis Paganini PA-c, [name]   . ULCER-GASTRIC 09/28/2009   Qualifier: Diagnosis of  By: Chester Holstein NP, Nevin Bloodgood    . URINARY TRACT INFECTION 09/21/2009   Qualifier: Diagnosis of  By: Amil Amen MD, Benjamine Mola    . VAGINITIS, CANDIDAL 12/25/2009   Qualifier: Diagnosis of  By: Amil Amen MD, Benjamine Mola    . Vitamin D deficiency 04/25/2013    Past Surgical History:   Procedure Laterality Date  . ABDOMINAL HYSTERECTOMY     partial  . CHOLECYSTECTOMY      Allergies as of 04/09/2020      Reactions   Insulin Aspart Other (See Comments), Swelling   adverse reaction to Novolog   Latex Itching, Rash   Morphine Itching, Rash      Medication List       Accurate as of April 09, 2020 11:59 PM. If you have any questions, ask your nurse or doctor.        STOP taking these medications   benzonatate 100 MG capsule Commonly known as: TESSALON Stopped by: Kathlene November, MD     TAKE these medications   amLODipine 5 MG tablet Commonly known as: NORVASC Take 1 tablet by mouth once daily   amoxicillin-clavulanate 875-125 MG tablet Commonly known as: Augmentin Take 1 tablet by mouth 2 (two) times daily. Started by: Kathlene November, MD   aspirin 81 MG tablet Take 81 mg by mouth daily.   atorvastatin 80 MG tablet Commonly known as: LIPITOR Take 1 tablet (80 mg total) by mouth daily.   azelastine 0.1 % nasal spray Commonly known as: ASTELIN Place 2 sprays into both nostrils 2 (two) times daily. Started by: Kathlene November, MD   Breo Ellipta 100-25 MCG/INH Aepb Generic drug: fluticasone furoate-vilanterol Inhale 1 puff into the lungs daily.   busPIRone 15 MG tablet Commonly known as: BUSPAR TAKE 1 TABLET BY MOUTH THREE TIMES DAILY   carvedilol 25 MG tablet Commonly known as: COREG TAKE 1 TABLET BY MOUTH TWICE DAILY WITH MEALS   Farxiga 10 MG Tabs tablet Generic drug: dapagliflozin propanediol Take 1 tablet by mouth once daily   fluticasone 50 MCG/ACT nasal spray Commonly known as: FLONASE Place 2 sprays into both nostrils daily.   gabapentin 300 MG capsule Commonly known as: NEURONTIN Take 2 capsules by mouth 3 (three) times daily.   insulin degludec 100 UNIT/ML FlexTouch Pen Commonly known as: TRESIBA Inject 40 Units into the skin daily at 10 pm.   meloxicam 7.5 MG tablet Commonly known as: MOBIC TAKE 1 TO 2 TABLETS BY MOUTH ONCE DAILY    metFORMIN 500 MG 24 hr tablet Commonly known as: GLUCOPHAGE-XR 2 (two) times daily.   methocarbamol 750 MG tablet Commonly known as: ROBAXIN TAKE 1 TABLET BY MOUTH EVERY 8 HOURS AS NEEDED   neomycin-polymyxin-hydrocortisone OTIC solution Commonly known as: CORTISPORIN Place 3 drops into the left ear 4 (four) times daily.   nitroGLYCERIN 0.4 MG SL tablet  Commonly known as: NITROSTAT Place 1 tablet (0.4 mg total) under the tongue every 5 (five) minutes as needed.   oxyCODONE 5 MG immediate release tablet Commonly known as: Oxy IR/ROXICODONE 1 tab po twice daily as needed for severe pain   polyethylene glycol 17 g packet Commonly known as: MIRALAX / GLYCOLAX Take 17 g by mouth daily.   ProAir RespiClick 937 (90 Base) MCG/ACT Aepb Generic drug: Albuterol Sulfate Inhale 2 puffs into the lungs at bedtime as needed.   albuterol 108 (90 Base) MCG/ACT inhaler Commonly known as: VENTOLIN HFA INHALE 2 PUFFS BY MOUTH EVERY 6 HOURS AS NEEDED   promethazine 12.5 MG tablet Commonly known as: PHENERGAN TAKE 1 TABLET BY MOUTH EVERY 8 HOURS AS NEEDED FOR NAUSEA FOR VOMITING   spironolactone 25 MG tablet Commonly known as: ALDACTONE Take 1 tablet by mouth once daily   Vitamin D3 10 MCG (400 UNIT) Caps Take 400 mg by mouth daily.          Objective:   Physical Exam Ht 6' (1.829 m)   Wt 189 lb (85.7 kg)   BMI 25.63 kg/m  This is a virtual video visit, alert oriented x3, in no distress.  No vital signs available. External ocular movements seem intact with no noted pain or discomfort    Assessment     55 year old female, PMH includes diabetes, heart disease, HTN, asthma, presents with:  Left facial and neck pain The patient is aware of the limitations of a virtual visit. DDx includes sinusitis, early shingles, and migraine (reports migraines many years ago, the pain today is  somewhat different). Plan: Augmentin, Flonase, Astelin, Tylenol, NyQuil. Call if not gradually  better, ER if symptoms severe Definitely see the eye doctor if she has eyeball pain or difficulties with her vision     I discussed the assessment and treatment plan with the patient. The patient was provided an opportunity to ask questions and all were answered. The patient agreed with the plan and demonstrated an understanding of the instructions.   The patient was advised to call back or seek an in-person evaluation if the symptoms worsen or if the condition fails to improve as anticipated.

## 2020-04-13 ENCOUNTER — Other Ambulatory Visit: Payer: Self-pay

## 2020-04-13 ENCOUNTER — Encounter (HOSPITAL_BASED_OUTPATIENT_CLINIC_OR_DEPARTMENT_OTHER): Payer: Self-pay

## 2020-04-13 ENCOUNTER — Emergency Department (HOSPITAL_BASED_OUTPATIENT_CLINIC_OR_DEPARTMENT_OTHER)
Admission: EM | Admit: 2020-04-13 | Discharge: 2020-04-13 | Disposition: A | Discharge status: Home / Self Care | Payer: 59 | Attending: Emergency Medicine | Admitting: Emergency Medicine

## 2020-04-13 ENCOUNTER — Emergency Department (HOSPITAL_BASED_OUTPATIENT_CLINIC_OR_DEPARTMENT_OTHER): Payer: 59

## 2020-04-13 DIAGNOSIS — E1122 Type 2 diabetes mellitus with diabetic chronic kidney disease: Secondary | ICD-10-CM | POA: Insufficient documentation

## 2020-04-13 DIAGNOSIS — I2510 Atherosclerotic heart disease of native coronary artery without angina pectoris: Secondary | ICD-10-CM | POA: Diagnosis not present

## 2020-04-13 DIAGNOSIS — Z79899 Other long term (current) drug therapy: Secondary | ICD-10-CM | POA: Insufficient documentation

## 2020-04-13 DIAGNOSIS — Z7982 Long term (current) use of aspirin: Secondary | ICD-10-CM | POA: Diagnosis not present

## 2020-04-13 DIAGNOSIS — Z7984 Long term (current) use of oral hypoglycemic drugs: Secondary | ICD-10-CM | POA: Insufficient documentation

## 2020-04-13 DIAGNOSIS — G500 Trigeminal neuralgia: Secondary | ICD-10-CM | POA: Diagnosis not present

## 2020-04-13 DIAGNOSIS — Z91040 Latex allergy status: Secondary | ICD-10-CM | POA: Insufficient documentation

## 2020-04-13 DIAGNOSIS — E1140 Type 2 diabetes mellitus with diabetic neuropathy, unspecified: Secondary | ICD-10-CM | POA: Diagnosis not present

## 2020-04-13 DIAGNOSIS — Z794 Long term (current) use of insulin: Secondary | ICD-10-CM | POA: Diagnosis not present

## 2020-04-13 DIAGNOSIS — N183 Chronic kidney disease, stage 3 unspecified: Secondary | ICD-10-CM | POA: Diagnosis not present

## 2020-04-13 DIAGNOSIS — Z8541 Personal history of malignant neoplasm of cervix uteri: Secondary | ICD-10-CM | POA: Diagnosis not present

## 2020-04-13 DIAGNOSIS — R519 Headache, unspecified: Secondary | ICD-10-CM | POA: Diagnosis present

## 2020-04-13 DIAGNOSIS — I129 Hypertensive chronic kidney disease with stage 1 through stage 4 chronic kidney disease, or unspecified chronic kidney disease: Secondary | ICD-10-CM | POA: Insufficient documentation

## 2020-04-13 LAB — CBC WITH DIFFERENTIAL/PLATELET
Abs Immature Granulocytes: 0 10*3/uL (ref 0.00–0.07)
Basophils Absolute: 0 10*3/uL (ref 0.0–0.1)
Basophils Relative: 1 %
Eosinophils Absolute: 0.5 10*3/uL (ref 0.0–0.5)
Eosinophils Relative: 8 %
HCT: 44.5 % (ref 36.0–46.0)
Hemoglobin: 14.5 g/dL (ref 12.0–15.0)
Immature Granulocytes: 0 %
Lymphocytes Relative: 37 %
Lymphs Abs: 2.3 10*3/uL (ref 0.7–4.0)
MCH: 29.8 pg (ref 26.0–34.0)
MCHC: 32.6 g/dL (ref 30.0–36.0)
MCV: 91.4 fL (ref 80.0–100.0)
Monocytes Absolute: 0.4 10*3/uL (ref 0.1–1.0)
Monocytes Relative: 7 %
Neutro Abs: 3 10*3/uL (ref 1.7–7.7)
Neutrophils Relative %: 47 %
Platelets: 260 10*3/uL (ref 150–400)
RBC: 4.87 MIL/uL (ref 3.87–5.11)
RDW: 15.1 % (ref 11.5–15.5)
WBC: 6.2 10*3/uL (ref 4.0–10.5)
nRBC: 0 % (ref 0.0–0.2)

## 2020-04-13 MED ORDER — KETOROLAC TROMETHAMINE 15 MG/ML IJ SOLN
15.0000 mg | Freq: Once | INTRAMUSCULAR | Status: AC
  Administered 2020-04-13: 15 mg via INTRAVENOUS
  Filled 2020-04-13: qty 1

## 2020-04-13 MED ORDER — IOHEXOL 300 MG/ML  SOLN
100.0000 mL | Freq: Once | INTRAMUSCULAR | Status: AC | PRN
  Administered 2020-04-13: 100 mL via INTRAVENOUS

## 2020-04-13 MED ORDER — METOCLOPRAMIDE HCL 5 MG/ML IJ SOLN
10.0000 mg | Freq: Once | INTRAMUSCULAR | Status: AC
  Administered 2020-04-13: 10 mg via INTRAVENOUS
  Filled 2020-04-13: qty 2

## 2020-04-13 NOTE — ED Triage Notes (Signed)
Arrives with c/o headache X1 week states that her PCP put her on antibiotic reports she has also had pain in her left eye and ear. Pt has been on antibiotic since Monday.

## 2020-04-13 NOTE — Discharge Instructions (Addendum)
You were seen in the emergency department for your persistent facial pain after resolution of your infection is treated with antibiotics by primary care doctor.  Your blood work and CT scan are very reassuring, as was your physical exam and vital signs.  Your symptoms appear to be from inflammation of the trigeminal nerve in your face, called trigeminal neuralgia.  For this reason you should follow-up with the neurologist listed below.  I suspect your posterior headache is a tension type headache, secondary to muscular spasm in your neck.  You may utilize topical analgesic such as Biofreeze, icy hot, heating pad, lidocaine patches to help relieve this.  Return the emergency department develop any worsening headache, blurred vision, double vision, confusion, nausea vomiting does not stop, or any other new severe symptoms.

## 2020-04-13 NOTE — ED Provider Notes (Signed)
[facility] EMERGENCY DEPARTMENT Provider Note   CSN: [id number] Arrival date & time: 04/13/20  6433     History Chief Complaint  Patient presents with  . Headache    [name] is a 55 y.o. female who presents with concern for headache and persistent left-sided facial pain x1 week.  Patient was evaluated by her PCP via telemedicine and started on an Augmentin on Monday, with improvement in the swelling beneath her left eye, however persistent pain in the left face.  She denies any pain in her eyeball itself, denies pain with eye movement, denies blurry vision, double vision in the last week.  She endorses posterior left-sided headache, with increased stress with new job, and sensation of tightness in her neck.  She endorses rhinorrhea, sinus pressure, but tested negative for Covid earlier this week.  She endorses improvement in sinus pressure with Augmentin as well.  I personally reviewed this patient's medical records.  She is history of hypertension, GERD, remote history of migraines, but not on any abortive medication at this time.  HPI     Past Medical History:  Diagnosis Date  . ABDOMINAL PAIN-EPIGASTRIC 09/14/2009   Qualifier: Diagnosis of  By: Trellis Paganini PA-c, [name]   . Acute cystitis 07/07/2017  . ANKLE PAIN, LEFT 12/25/2009   Qualifier: Diagnosis of  By: Amil Amen MD, Benjamine Mola    . Anxiety 01/22/2016  . CAD (coronary artery disease) 07/07/2017  . Cardiac murmur 02/01/2019  . Cardiomyopathy, secondary ([place]) 09/06/2009   Qualifier: Diagnosis of  By: Arvid Right   Formatting of this note might be different from the original. Overview:  Qualifier: Diagnosis of  By: Arvid Right  . Cardiomyopathy, unspecified ([place]) 09/06/2009   Formatting of this note might be different from the original. Overview:  Overview:  Qualifier: Diagnosis of  By: Arvid Right  Overview:  Overview:  Qualifier: Diagnosis of  By: Arvid Right Formatting of this note might be different from the original. Overview:  Qualifier: Diagnosis of  By: Arvid Right  . Cerebrovascular disease 09/13/2012  . Cervical cancer ([place])    had surgery in 2001.  . CHEST PAIN, ATYPICAL 07/30/2009   Qualifier: Diagnosis of  By: Amil Amen MD, Winona Legato of this note might be different from the original. Overview:  Qualifier: Diagnosis of  By: Amil Amen MD, Benjamine Mola  . Chronic back pain   . Chronic kidney disease, stage III (moderate) ([place]) 05/23/2014  . Chronic pain syndrome 01/23/2016  . Coronary artery disease    30% lesions noted  . Current use of insulin ([place]) 05/23/2014  . Depression   . Diabetes mellitus   . DIABETES MELLITUS, TYPE II, UNCONTROLLED 07/30/2009   Qualifier: Diagnosis of  By: Amil Amen MD, Benjamine Mola    . Diabetes type 2, uncontrolled ([place]) 05/23/2014  . Diabetic gastroparesis associated with type 1 diabetes mellitus ([place]) 11/27/2016  . DIABETIC PERIPHERAL NEUROPATHY 09/21/2009   Qualifier: Diagnosis of  By: Amil Amen MD, Benjamine Mola    . Diabetic peripheral neuropathy ([place]) 01/23/2016  . Dysuria 12/25/2009   Qualifier: Diagnosis of  By: Amil Amen MD, Benjamine Mola    . Elevation of level of transaminase or lactic acid dehydrogenase (LDH) 09/21/2009   Formatting of this note might be different from the original. Overview:  Qualifier: Diagnosis of  By: Amil Amen MD, Benjamine Mola  . Essential hypertension 08/30/2009   Qualifier: Diagnosis of  By: Lovette Cliche, CNA, Christy    . Gastric  atony 07/30/2009   Formatting of this note might be different from the original. Overview:  Qualifier: Diagnosis of  By: Amil Amen MD, Benjamine Mola  . Gastroesophageal reflux disease 07/30/2009   Formatting of this note might be different from the original. Overview:  Overview:  Qualifier: Diagnosis of  By: Amil Amen MD, Elby Showers: Diagnosis of  By: Chester Holstein NP, Ellwood Handler of this note might be different from the  original. Overview:  Qualifier: Diagnosis of  By: Amil Amen MD, Elby Showers: Diagnosis of  By: Chester Holstein NP, Nevin Bloodgood  . Gastroparesis 07/30/2009   Qualifier: Diagnosis of  By: Amil Amen MD, Benjamine Mola    . GERD 07/30/2009   Qualifier: Diagnosis of  By: Amil Amen MD, Elby Showers: Diagnosis of  By: Chester Holstein NP, Nevin Bloodgood    . GERD (gastroesophageal reflux disease)   . History of cervical cancer 08/17/2015   Status post partial hysterectomy  Formatting of this note might be different from the original. Status post partial hysterectomy  . HLD (hyperlipidemia) 07/30/2009   Qualifier: Diagnosis of  By: Amil Amen MD, Benjamine Mola    . Hyperlipidemia   . Hyperlipidemia, unspecified 07/30/2009   Formatting of this note might be different from the original. Overview:  Qualifier: Diagnosis of  By: Amil Amen MD, Winona Legato of this note might be different from the original. Overview:  Qualifier: Diagnosis of  By: Amil Amen MD, Benjamine Mola  . Hypertension   . INSOMNIA 09/21/2009   Qualifier: Diagnosis of  By: Amil Amen MD, Benjamine Mola    . Lumbar facet arthropathy 05/14/2016  . Lumbar spinal stenosis 02/11/2018  . Metatarsalgia of both feet 03/11/2017  . Migraine 09/13/2012   Overview:  IMPRESSION: possible abd migraine  . Mixed dyslipidemia 02/01/2019  . Nausea & vomiting 07/07/2017  . Nausea with vomiting, unspecified 09/14/2009   Qualifier: Diagnosis of  By: Shane Crutch, [name]   Formatting of this note might be different from the original. Overview:  Qualifier: Diagnosis of  By: Shane Crutch, [name]  . Neuropathy   . Nonspecific abnormal findings on radiological and examination of skull and head 09/13/2012  . Overweight (BMI 25.0-29.9) 07/21/2014  . POSITIVE PPD 07/30/2009   Annotation: CXR clear ?  diagnosed in 2/11?  On INH/pyridoxine Qualifier: Diagnosis of  By: Amil Amen MD, Benjamine Mola    . Pure hypercholesterolemia 11/27/2016  . Retention of urine 06/25/2011  . Right flank pain   . TOBACCO ABUSE  09/21/2009   Qualifier: Diagnosis of  By: Amil Amen MD, Winona Legato of this note might be different from the original. Overview:  Qualifier: Diagnosis of  By: Amil Amen MD, Benjamine Mola  . TRANSAMINASES, SERUM, ELEVATED 09/21/2009   Qualifier: Diagnosis of  By: Amil Amen MD, Benjamine Mola    . Tuberculin test reaction 07/30/2009   Formatting of this note might be different from the original. Overview:  Annotation: CXR clear ?  diagnosed in 2/11?  On INH/pyridoxine Qualifier: Diagnosis of  By: Amil Amen MD, Benjamine Mola  . Type 2 diabetes mellitus with hyperglycemia ([place]) 07/30/2009   Qualifier: Diagnosis of  By: Amil Amen MD, Winona Legato of this note might be different from the original. Overview:  Qualifier: Diagnosis of  By: Amil Amen MD, Benjamine Mola  . Type 2 diabetes mellitus with hyperglycemia, with long-term current use of insulin ([place]) 07/30/2009   Formatting of this note might be different from the original. Overview:  05/30/2015 A1C was 16.7% (!)  Overview:  05/30/2015 A1C was 16.7% (!) Formatting of this note might be different from  the original. 05/30/2015 A1C was 16.7% (!)  . Type 2 diabetes mellitus with stage 3 chronic kidney disease ([place]) 09/14/2009   Qualifier: Diagnosis of  By: Trellis Paganini PA-c, [name]   . ULCER-GASTRIC 09/28/2009   Qualifier: Diagnosis of  By: Chester Holstein NP, Nevin Bloodgood    . URINARY TRACT INFECTION 09/21/2009   Qualifier: Diagnosis of  By: Amil Amen MD, Benjamine Mola    . VAGINITIS, CANDIDAL 12/25/2009   Qualifier: Diagnosis of  By: Amil Amen MD, Benjamine Mola    . Vitamin D deficiency 04/25/2013    Patient Active Problem List   Diagnosis Date Noted  . Cervical cancer ([place])   . Chronic back pain   . Coronary artery disease   . GERD (gastroesophageal reflux disease)   . Hyperlipidemia   . Hypertension   . Mixed dyslipidemia 02/01/2019  . Cardiac murmur 02/01/2019  . Lumbar spinal stenosis 02/11/2018  . Neuropathy 07/20/2017  . Right flank pain   . Nausea & vomiting 07/07/2017   . Acute cystitis 07/07/2017  . CAD (coronary artery disease) 07/07/2017  . Metatarsalgia of both feet 03/11/2017  . Diabetic gastroparesis associated with type 1 diabetes mellitus ([place]) 11/27/2016  . Pure hypercholesterolemia 11/27/2016  . Lumbar facet arthropathy 05/14/2016  . Chronic pain syndrome 01/23/2016  . Diabetic peripheral neuropathy ([place]) 01/23/2016  . Anxiety 01/22/2016  . History of cervical cancer 08/17/2015  . Depression 08/10/2015  . Overweight (BMI 25.0-29.9) 07/21/2014  . Chronic kidney disease, stage III (moderate) ([place]) 05/23/2014  . Diabetes type 2, uncontrolled ([place]) 05/23/2014  . Current use of insulin ([place]) 05/23/2014  . Vitamin D deficiency 04/25/2013  . Cerebrovascular disease 09/13/2012  . Migraine 09/13/2012  . Nonspecific abnormal findings on radiological and examination of skull and head 09/13/2012  . Retention of urine 06/25/2011  . ANKLE PAIN, LEFT 12/25/2009  . DYSURIA 12/25/2009  . VAGINITIS, CANDIDAL 12/25/2009  . ULCER-GASTRIC 09/28/2009  . DIABETIC PERIPHERAL NEUROPATHY 09/21/2009  . TOBACCO ABUSE 09/21/2009  . URINARY TRACT INFECTION 09/21/2009  . INSOMNIA 09/21/2009  . TRANSAMINASES, SERUM, ELEVATED 09/21/2009  . Elevation of level of transaminase or lactic acid dehydrogenase (LDH) 09/21/2009  . Type 2 diabetes mellitus with stage 3 chronic kidney disease ([place]) 09/14/2009  . Nausea with vomiting, unspecified 09/14/2009  . ABDOMINAL PAIN-EPIGASTRIC 09/14/2009  . Cardiomyopathy, secondary ([place]) 09/06/2009  . Cardiomyopathy, unspecified ([place]) 09/06/2009  . Essential hypertension 08/30/2009  . DIABETES MELLITUS, TYPE II, UNCONTROLLED 07/30/2009  . HLD (hyperlipidemia) 07/30/2009  . GERD 07/30/2009  . GASTROPARESIS 07/30/2009  . CHEST PAIN, ATYPICAL 07/30/2009  . POSITIVE PPD 07/30/2009  . Type 2 diabetes mellitus with hyperglycemia ([place]) 07/30/2009  . Gastric atony 07/30/2009  . Gastroesophageal reflux disease 07/30/2009  .  Hyperlipidemia, unspecified 07/30/2009  . Type 2 diabetes mellitus with hyperglycemia, with long-term current use of insulin ([place]) 07/30/2009  . Tuberculin test reaction 07/30/2009    Past Surgical History:  Procedure Laterality Date  . ABDOMINAL HYSTERECTOMY     partial  . CHOLECYSTECTOMY       OB History   No obstetric history on file.     Family History  Problem Relation Age of Onset  . Diabetes Mother   . Diabetes Father     Social History   Tobacco Use  . Smoking status: Never Smoker  . Smokeless tobacco: Never Used  Vaping Use  . Vaping Use: Never used  Substance Use Topics  . Alcohol use: Yes    Comment: occ  . Drug use: No    Home  Medications Prior to Admission medications   Medication Sig Start Date End Date Taking? Authorizing Provider  albuterol (VENTOLIN HFA) 108 (90 Base) MCG/ACT inhaler INHALE 2 PUFFS BY MOUTH EVERY 6 HOURS AS NEEDED 10/13/19   Saguier, Percell Miller, PA-C  Albuterol Sulfate (PROAIR RESPICLICK) 284 (90 Base) MCG/ACT AEPB Inhale 2 puffs into the lungs at bedtime as needed. 04/12/19   Saguier, Percell Miller, PA-C  amLODipine (NORVASC) 5 MG tablet Take 1 tablet by mouth once daily 04/03/20   Saguier, Percell Miller, PA-C  amoxicillin-clavulanate (AUGMENTIN) 875-125 MG tablet Take 1 tablet by mouth 2 (two) times daily. 04/09/20   Colon Branch, MD  aspirin 81 MG tablet Take 81 mg by mouth daily.    [provider]  atorvastatin (LIPITOR) 80 MG tablet Take 1 tablet (80 mg total) by mouth daily. 11/08/19   Saguier, Percell Miller, PA-C  azelastine (ASTELIN) 0.1 % nasal spray Place 2 sprays into both nostrils 2 (two) times daily. 04/09/20   Colon Branch, MD  busPIRone (BUSPAR) 15 MG tablet TAKE 1 TABLET BY MOUTH THREE TIMES DAILY 02/06/20   Saguier, Percell Miller, PA-C  carvedilol (COREG) 25 MG tablet TAKE 1 TABLET BY MOUTH TWICE DAILY WITH MEALS 02/02/20   Saguier, Percell Miller, PA-C  Cholecalciferol (VITAMIN D3) 400 units CAPS Take 400 mg by mouth daily.    [provider]   FARXIGA 10 MG TABS tablet Take 1 tablet by mouth once daily 07/21/19   Saguier, Percell Miller, PA-C  fluticasone [facility]) 50 MCG/ACT nasal spray Place 2 sprays into both nostrils daily. 09/27/19   Saguier, Percell Miller, PA-C  fluticasone furoate-vilanterol (BREO ELLIPTA) 100-25 MCG/INH AEPB Inhale 1 puff into the lungs daily. 04/12/19   Saguier, Percell Miller, PA-C  gabapentin (NEURONTIN) 300 MG capsule Take 2 capsules by mouth 3 (three) times daily. 08/17/19   [provider]  insulin degludec (TRESIBA) 100 UNIT/ML FlexTouch Pen Inject 40 Units into the skin daily at 10 pm.    [provider]  meloxicam (MOBIC) 7.5 MG tablet TAKE 1 TO 2 TABLETS BY MOUTH ONCE DAILY 03/16/20   Saguier, Percell Miller, PA-C  metFORMIN (GLUCOPHAGE-XR) 500 MG 24 hr tablet 2 (two) times daily. 01/03/19   [provider]  methocarbamol (ROBAXIN) 750 MG tablet TAKE 1 TABLET BY MOUTH EVERY 8 HOURS AS NEEDED 04/05/20   Saguier, Percell Miller, PA-C  neomycin-polymyxin-hydrocortisone (CORTISPORIN) OTIC solution Place 3 drops into the left ear 4 (four) times daily. 12/29/18   Saguier, Percell Miller, PA-C  nitroGLYCERIN (NITROSTAT) 0.4 MG SL tablet Place 1 tablet (0.4 mg total) under the tongue every 5 (five) minutes as needed. Patient not taking: Reported on 01/10/2020 02/01/19 06/13/19  Revankar, Reita Cliche, MD  oxyCODONE (OXY IR/ROXICODONE) 5 MG immediate release tablet 1 tab po twice daily as needed for severe pain 11/30/19   Saguier, Percell Miller, PA-C  polyethylene glycol (MIRALAX / GLYCOLAX) packet Take 17 g by mouth daily. 07/12/17   Patrecia Pour, Christean Grief, MD  promethazine (PHENERGAN) 12.5 MG tablet TAKE 1 TABLET BY MOUTH EVERY 8 HOURS AS NEEDED FOR NAUSEA FOR VOMITING 04/05/20   Saguier, Percell Miller, PA-C  spironolactone (ALDACTONE) 25 MG tablet Take 1 tablet by mouth once daily 07/21/19   Saguier, Percell Miller, PA-C    Allergies    Insulin aspart, Latex, and Morphine  Review of Systems   Review of Systems  Constitutional: Negative.  Negative for chills and  fever.  HENT: Positive for congestion, facial swelling, sinus pressure and sinus pain. Negative for sore throat, trouble swallowing and voice change.   Eyes: Negative  for photophobia, pain, redness and visual disturbance.  Respiratory: Negative.   Cardiovascular: Negative.   Gastrointestinal: Negative.   Endocrine: Negative.   Genitourinary: Negative.   Musculoskeletal: Negative.   Skin: Negative.   Neurological: Positive for headaches. Negative for dizziness, syncope, weakness and light-headedness.  Hematological: Negative.     Physical Exam Updated Vital Signs BP 118/83   Pulse 80   Temp 98 F (36.7 C) (Oral)   Resp 18   Ht 6' (1.829 m)   Wt 85.7 kg   SpO2 100%   BMI 25.63 kg/m   Physical Exam Vitals and nursing note reviewed.  HENT:     Head: Normocephalic and atraumatic.     Comments: No skin changes to suggest herpes zoster, there is tenderness palpation to light touch above the left orbit and along the left zygomatic arch.    Nose: Nose normal.     Mouth/Throat:     Mouth: Mucous membranes are moist.     Pharynx: Oropharynx is clear. Uvula midline. No oropharyngeal exudate or posterior oropharyngeal erythema.     Tonsils: No tonsillar exudate.  Eyes:     General: Lids are normal. Vision grossly intact.        Right eye: No discharge.        Left eye: No discharge.     Extraocular Movements: Extraocular movements intact.     Conjunctiva/sclera: Conjunctivae normal.     Pupils: Pupils are equal, round, and reactive to light.     Visual Fields: Right eye visual fields normal and left eye visual fields normal.     Comments: NO pain with EOMs.  Neck:     Trachea: Trachea and phonation normal.  Cardiovascular:     Rate and Rhythm: Normal rate and regular rhythm.     Pulses: Normal pulses.          Radial pulses are 2+ on the right side and 2+ on the left side.       Dorsalis pedis pulses are 2+ on the right side and 2+ on the left side.     Heart sounds: Normal  heart sounds. No murmur heard.   Pulmonary:     Effort: Pulmonary effort is normal. No respiratory distress.     Breath sounds: Normal breath sounds. No wheezing or rales.  Chest:     Chest wall: No lacerations, swelling, tenderness, crepitus or edema.  Abdominal:     General: Bowel sounds are normal. There is no distension.     Tenderness: There is no abdominal tenderness.  Musculoskeletal:        General: No deformity.     Cervical back: Neck supple. No crepitus. Muscular tenderness present. No pain with movement or spinous process tenderness.     Right lower leg: No edema.     Left lower leg: No edema.  Lymphadenopathy:     Cervical: No cervical adenopathy.  Skin:    General: Skin is warm and dry.     Findings: No rash.     Comments: No skin changes to suggest herpes zoster oticus or a ophthalmicus   Neurological:     General: No focal deficit present.     Mental Status: She is alert and oriented to person, place, and time. Mental status is at baseline.     Cranial Nerves: Cranial nerves are intact.     Sensory: Sensation is intact.     Motor: Motor function is intact.     Coordination: Coordination is intact.  Gait: Gait is intact.  Psychiatric:        Mood and Affect: Mood normal.     ED Results / Procedures / Treatments   Labs (all labs ordered are listed, but only abnormal results are displayed) Labs Reviewed  BASIC METABOLIC PANEL - Abnormal; Notable for the following components:      Result Value   Glucose, Bld 171 (*)    All other components within normal limits  CBC WITH DIFFERENTIAL/PLATELET    EKG None  Radiology CT Orbits W Contrast  Result Date: 04/13/2020 CLINICAL DATA:  Left eye pain for 1 week, on antibiotics EXAM: CT ORBITS WITH CONTRAST TECHNIQUE: Multidetector CT images was performed according to the standard protocol following intravenous contrast administration. CONTRAST:  13mL OMNIPAQUE IOHEXOL 300 MG/ML  SOLN COMPARISON:  None. FINDINGS:  Orbits: No proptosis. No postseptal inflammatory changes. Globes, extraocular muscles, and optic nerve sheath complexes are symmetric and unremarkable. Visualized sinuses: Aerated. Visualized mastoid air cells are also clear. Soft tissues: No significant abnormality. Limited intracranial: No abnormal enhancement. IMPRESSION: No significant abnormality identified. Electronically Signed   By: Macy Mis M.D.   On: 04/13/2020 13:28    Procedures Procedures   Medications Ordered in ED Medications  metoCLOPramide (REGLAN) injection 10 mg (10 mg Intravenous Given 04/13/20 1144)  ketorolac (TORADOL) 15 MG/ML injection 15 mg (15 mg Intravenous Given 04/13/20 1144)  iohexol (OMNIPAQUE) 300 MG/ML solution 100 mL (100 mLs Intravenous Contrast Given 04/13/20 1309)    ED Course  I have reviewed the triage vital signs and the nursing notes.  Pertinent labs & imaging results that were available during my care of the patient were reviewed by me and considered in my medical decision making (see chart for details).    MDM Rules/Calculators/A&P                         55 year old female presents with concern for persistent left-sided facial pain after completing 1 week of antibiotics for skin infection around her left eye.  Hypertensive on intake, vital signs otherwise normal.  Cardiopulmonary exam is normal, abdominal exam is benign.  Facial exam revealed no skin changes to suggest herpes zoster, there is no pain with EOMs pupils are equal round and reactive to light.  There is no facial edema this time but there is tenderness to palpation of the face with light touch along the zygomatic arch and above the orbit.  No focal deficit on neuro exam.  Differential diagnosis for this patient includes but is not limited to preseptal/orbital cellulitis, trigeminal neuralgia, migraine headache, temporal arteritis, glaucoma, sinusitis, TMJ pain, herpes zoster ophthalmicus / oticus.   Analgesia offered as well as  Reglan, CBC unremarkable, BMP unremarkable.  CT of the orbits obtained due to concern for recent periorbital skin infection.  Negative for acute abnormality.  Pain improved after analgesia and Reglan.  Given reassuring physical exam, vital signs, laboratory and imaging studies, no further work-up is warranted in the ED at this time.  Suspect patient's symptoms are secondary to trigeminal neuralgia, which may have been incited by recent infection.  Patient is already on gabapentin.  Recommend close neurology follow-up for further evaluation and treatment.  Rubbie voiced understanding of her medical evaluation and treatment plan.  Each of her questions was answered to her expressed satisfaction.  Return precautions given.  Patient is well-appearing, stable, and appropriate for discharge.  This chart was dictated using voice recognition software, Dragon. Despite the best efforts  of this provider to proofread and correct errors, errors may still occur which can change documentation meaning.  Final Clinical Impression(s) / ED Diagnoses Final diagnoses:  Trigeminal neuralgia    Rx / DC Orders ED Discharge Orders    None       Aura Dials 04/13/20 1542    Blanchie Dessert, MD 04/14/20 2204

## 2020-04-16 ENCOUNTER — Emergency Department (HOSPITAL_BASED_OUTPATIENT_CLINIC_OR_DEPARTMENT_OTHER): Payer: 59

## 2020-04-16 ENCOUNTER — Ambulatory Visit (INDEPENDENT_AMBULATORY_CARE_PROVIDER_SITE_OTHER): Payer: 59 | Admitting: Medical

## 2020-04-16 ENCOUNTER — Encounter: Payer: Self-pay | Admitting: *Deleted

## 2020-04-16 ENCOUNTER — Emergency Department (HOSPITAL_BASED_OUTPATIENT_CLINIC_OR_DEPARTMENT_OTHER)
Admission: EM | Admit: 2020-04-16 | Discharge: 2020-04-16 | Disposition: A | Discharge status: Home / Self Care | Payer: 59 | Attending: Emergency Medicine | Admitting: Emergency Medicine

## 2020-04-16 ENCOUNTER — Other Ambulatory Visit: Payer: Self-pay

## 2020-04-16 VITALS — BP 134/98 | HR 94 | Temp 97.9°F | Resp 18 | Ht 72.0 in | Wt 160.2 lb

## 2020-04-16 DIAGNOSIS — R634 Abnormal weight loss: Secondary | ICD-10-CM

## 2020-04-16 DIAGNOSIS — Z79899 Other long term (current) drug therapy: Secondary | ICD-10-CM | POA: Insufficient documentation

## 2020-04-16 DIAGNOSIS — R0789 Other chest pain: Secondary | ICD-10-CM | POA: Insufficient documentation

## 2020-04-16 DIAGNOSIS — N183 Chronic kidney disease, stage 3 unspecified: Secondary | ICD-10-CM | POA: Insufficient documentation

## 2020-04-16 DIAGNOSIS — Z8541 Personal history of malignant neoplasm of cervix uteri: Secondary | ICD-10-CM | POA: Diagnosis not present

## 2020-04-16 DIAGNOSIS — I129 Hypertensive chronic kidney disease with stage 1 through stage 4 chronic kidney disease, or unspecified chronic kidney disease: Secondary | ICD-10-CM | POA: Diagnosis not present

## 2020-04-16 DIAGNOSIS — Z7984 Long term (current) use of oral hypoglycemic drugs: Secondary | ICD-10-CM | POA: Diagnosis not present

## 2020-04-16 DIAGNOSIS — Z7982 Long term (current) use of aspirin: Secondary | ICD-10-CM | POA: Insufficient documentation

## 2020-04-16 DIAGNOSIS — Z794 Long term (current) use of insulin: Secondary | ICD-10-CM | POA: Insufficient documentation

## 2020-04-16 DIAGNOSIS — Z91040 Latex allergy status: Secondary | ICD-10-CM | POA: Insufficient documentation

## 2020-04-16 DIAGNOSIS — E010 Iodine-deficiency related diffuse (endemic) goiter: Secondary | ICD-10-CM | POA: Diagnosis not present

## 2020-04-16 DIAGNOSIS — Z20822 Contact with and (suspected) exposure to covid-19: Secondary | ICD-10-CM | POA: Diagnosis not present

## 2020-04-16 DIAGNOSIS — I2510 Atherosclerotic heart disease of native coronary artery without angina pectoris: Secondary | ICD-10-CM | POA: Diagnosis not present

## 2020-04-16 DIAGNOSIS — Z1211 Encounter for screening for malignant neoplasm of colon: Secondary | ICD-10-CM | POA: Diagnosis not present

## 2020-04-16 DIAGNOSIS — R079 Chest pain, unspecified: Secondary | ICD-10-CM | POA: Diagnosis not present

## 2020-04-16 DIAGNOSIS — F439 Reaction to severe stress, unspecified: Secondary | ICD-10-CM

## 2020-04-16 DIAGNOSIS — E1122 Type 2 diabetes mellitus with diabetic chronic kidney disease: Secondary | ICD-10-CM | POA: Diagnosis not present

## 2020-04-16 DIAGNOSIS — G500 Trigeminal neuralgia: Secondary | ICD-10-CM

## 2020-04-16 DIAGNOSIS — E1140 Type 2 diabetes mellitus with diabetic neuropathy, unspecified: Secondary | ICD-10-CM | POA: Insufficient documentation

## 2020-04-16 DIAGNOSIS — Z124 Encounter for screening for malignant neoplasm of cervix: Secondary | ICD-10-CM

## 2020-04-16 HISTORY — DX: Trigeminal neuralgia: G50.0

## 2020-04-16 LAB — BASIC METABOLIC PANEL
Anion gap: 12 (ref 5–15)
Anion gap: 10 (ref 5–15)
BUN: 18 mg/dL (ref 6–20)
BUN: 20 mg/dL (ref 6–20)
CO2: 25 mmol/L (ref 22–32)
CO2: 26 mmol/L (ref 22–32)
Calcium: 9.6 mg/dL (ref 8.9–10.3)
Calcium: 9.8 mg/dL (ref 8.9–10.3)
Chloride: 102 mmol/L (ref 98–111)
Chloride: 103 mmol/L (ref 98–111)
Creatinine, Ser: 0.92 mg/dL (ref 0.44–1.00)
Creatinine, Ser: 1.12 mg/dL — ABNORMAL HIGH (ref 0.44–1.00)
GFR, Estimated: >60 mL/min (ref >60)
GFR, Estimated: 58 mL/min — ABNORMAL LOW (ref >60)
Glucose, Bld: 171 mg/dL — ABNORMAL HIGH (ref 70–99)
Glucose, Bld: 150 mg/dL — ABNORMAL HIGH (ref 70–99)
Potassium: 4.3 mmol/L (ref 3.5–5.1)
Potassium: 4.2 mmol/L (ref 3.5–5.1)
Sodium: 139 mmol/L (ref 135–145)
Sodium: 139 mmol/L (ref 135–145)

## 2020-04-16 LAB — CBC
HCT: 41.8 % (ref 36.0–46.0)
Hemoglobin: 13.6 g/dL (ref 12.0–15.0)
MCH: 30.1 pg (ref 26.0–34.0)
MCHC: 32.5 g/dL (ref 30.0–36.0)
MCV: 92.5 fL (ref 80.0–100.0)
Platelets: 287 10*3/uL (ref 150–400)
RBC: 4.52 MIL/uL (ref 3.87–5.11)
RDW: 15.4 % (ref 11.5–15.5)
WBC: 7 10*3/uL (ref 4.0–10.5)
nRBC: 0 % (ref 0.0–0.2)

## 2020-04-16 LAB — D-DIMER, QUANTITATIVE: D-Dimer, Quant: 0.47 ug/mL-FEU (ref 0.00–0.50)

## 2020-04-16 LAB — TROPONIN I (HIGH SENSITIVITY)
Troponin I (High Sensitivity): 4 ng/L (ref <18)
Troponin I (High Sensitivity): 4 ng/L (ref <18)

## 2020-04-16 MED ORDER — ASPIRIN 81 MG PO CHEW
324.0000 mg | CHEWABLE_TABLET | Freq: Once | ORAL | Status: AC
  Administered 2020-04-16: 324 mg via ORAL
  Filled 2020-04-16: qty 4
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
## 2020-04-16 NOTE — ED Notes (Signed)
See EDP assessment 

## 2020-04-16 NOTE — ED Triage Notes (Signed)
Chest pain x 3 hours. She was here a few days ago with headache.
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
## 2020-04-16 NOTE — Discharge Instructions (Addendum)
Your work up was reassuring today however it is recommended that you follow up with your cardiologist in the next 1-2 days for further evaluation  We have tested you for COVID - please await your results at home. We will call you if you test positive. You can also log into your mychart and check the results that way.   Return to the ED IMMEDIATELY for any worsening symptoms including worsening pain, vomiting, shortness of breath, passing out, or any other new/concerning symptoms.

## 2020-04-16 NOTE — ED Provider Notes (Signed)
[facility] Provider Note   CSN: [id number] Arrival date & time: 04/16/20  1622     History Chief Complaint  Patient presents with  . Chest Pain    AZZIE THIEM is a 55 y.o. female with PMHx HTN, HLD, Diabetes, CAD, who presents to the ED with complaint of gradual onset, constant, sharp, left sided chest pain radiating into right chest that began around 1 PM today. Pt reports she was getting ready to go to a doctor's appointment when she began having pain. She did not think much of it at first however it seemed to get worse while at her PCP's office; she took 1 NTG without much relief. Pt also complains of intermittent "tingling" to both of her hands at various times - states her entire right hand was tingling at one point and her left middle finger was tingling at another point. Her PCP obtained an EKG which did show nonspecific ST changes in II, III, and aVF. Cardiology recommended pt come to the ED to rule out MI. Pt continues to have pain. She denies any alleviating or exacerbating factors. She does mention that she has had "sinus" issues for the past 2 weeks and was prescribed an antibiotic without relief. She also reports she drove down to [place] with her brother over the weekend (total of 3 hours one way) and states that her brother had rhinorrhea and nasal congestion without having a COVID test done. Pt is vaccinated x 2; last dose in April of last year. She mentions she had COVID in November and had to wait to receive her booster which is why she has not gotten it done. Pt denies history of MI herself. She does have positive FHx; her father passed away at the age of 56 from an MI. Pt used to smoke very occasionally however was never a habitual smoker. She does not smoke currently. No hx DVT/PE. No exogenous hormone use. No hemoptysis. No active malignancy. Pt denies fevers, chills, shortness of breath, nausea, vomiting, leg swelling, or any other  associated symptoms.   Stress test 02/15/2019  Nuclear stress EF: 47%.  The left ventricular ejection fraction is mildly decreased (45-54%).  There was no ST segment deviation noted during stress.  No T wave inversion was noted during stress.  This is an intermediate risk study.   The history is provided by the patient and medical records.    HPI: A 55 year old patient with a history of treated diabetes and hypertension presents for evaluation of chest pain. Initial onset of pain was approximately 3-6 hours ago. The patient's chest pain is sharp and is not worse with exertion. The patient's chest pain is middle- or left-sided, is not well-localized, is not described as heaviness/pressure/tightness and does not radiate to the arms/jaw/neck. The patient does not complain of nausea and denies diaphoresis. The patient has a family history of coronary artery disease in a first-degree relative with onset less than age 29. The patient has no history of stroke, has no history of peripheral artery disease, has not smoked in the past 90 days, has no history of hypercholesterolemia and does not have an elevated BMI (>=30).   Past Medical History:  Diagnosis Date  . ABDOMINAL PAIN-EPIGASTRIC 09/14/2009   Qualifier: Diagnosis of  By: Trellis Paganini PA-c, [name]   . Acute cystitis 07/07/2017  . ANKLE PAIN, LEFT 12/25/2009   Qualifier: Diagnosis of  By: Amil Amen MD, Benjamine Mola    . Anxiety 01/22/2016  . CAD (  coronary artery disease) 07/07/2017  . Cardiac murmur 02/01/2019  . Cardiomyopathy, secondary ([place]) 09/06/2009   Qualifier: Diagnosis of  By: Arvid Right   Formatting of this note might be different from the original. Overview:  Qualifier: Diagnosis of  By: Arvid Right  . Cardiomyopathy, unspecified ([place]) 09/06/2009   Formatting of this note might be different from the original. Overview:  Overview:  Qualifier: Diagnosis of  By: Arvid Right  Overview:   Overview:  Qualifier: Diagnosis of  By: Arvid Right Formatting of this note might be different from the original. Overview:  Qualifier: Diagnosis of  By: Arvid Right  . Cerebrovascular disease 09/13/2012  . Cervical cancer ([place])    had surgery in 2001.  . CHEST PAIN, ATYPICAL 07/30/2009   Qualifier: Diagnosis of  By: Amil Amen MD, Winona Legato of this note might be different from the original. Overview:  Qualifier: Diagnosis of  By: Amil Amen MD, Benjamine Mola  . Chronic back pain   . Chronic kidney disease, stage III (moderate) ([place]) 05/23/2014  . Chronic pain syndrome 01/23/2016  . Coronary artery disease    30% lesions noted  . Current use of insulin ([place]) 05/23/2014  . Depression   . Diabetes mellitus   . DIABETES MELLITUS, TYPE II, UNCONTROLLED 07/30/2009   Qualifier: Diagnosis of  By: Amil Amen MD, Benjamine Mola    . Diabetes type 2, uncontrolled ([place]) 05/23/2014  . Diabetic gastroparesis associated with type 1 diabetes mellitus ([place]) 11/27/2016  . DIABETIC PERIPHERAL NEUROPATHY 09/21/2009   Qualifier: Diagnosis of  By: Amil Amen MD, Benjamine Mola    . Diabetic peripheral neuropathy ([place]) 01/23/2016  . Dysuria 12/25/2009   Qualifier: Diagnosis of  By: Amil Amen MD, Benjamine Mola    . Elevation of level of transaminase or lactic acid dehydrogenase (LDH) 09/21/2009   Formatting of this note might be different from the original. Overview:  Qualifier: Diagnosis of  By: Amil Amen MD, Benjamine Mola  . Essential hypertension 08/30/2009   Qualifier: Diagnosis of  By: Lovette Cliche, CNA, Christy    . Gastric atony 07/30/2009   Formatting of this note might be different from the original. Overview:  Qualifier: Diagnosis of  By: Amil Amen MD, Benjamine Mola  . Gastroesophageal reflux disease 07/30/2009   Formatting of this note might be different from the original. Overview:  Overview:  Qualifier: Diagnosis of  By: Amil Amen MD, Elby Showers: Diagnosis of  By: Chester Holstein NP, Ellwood Handler  of this note might be different from the original. Overview:  Qualifier: Diagnosis of  By: Amil Amen MD, Elby Showers: Diagnosis of  By: Chester Holstein NP, Nevin Bloodgood  . Gastroparesis 07/30/2009   Qualifier: Diagnosis of  By: Amil Amen MD, Benjamine Mola    . GERD 07/30/2009   Qualifier: Diagnosis of  By: Amil Amen MD, Elby Showers: Diagnosis of  By: Chester Holstein NP, Nevin Bloodgood    . GERD (gastroesophageal reflux disease)   . History of cervical cancer 08/17/2015   Status post partial hysterectomy  Formatting of this note might be different from the original. Status post partial hysterectomy  . HLD (hyperlipidemia) 07/30/2009   Qualifier: Diagnosis of  By: Amil Amen MD, Benjamine Mola    . Hyperlipidemia   . Hyperlipidemia, unspecified 07/30/2009   Formatting of this note might be different from the original. Overview:  Qualifier: Diagnosis of  By: Amil Amen MD, Winona Legato of this note might be different from the original. Overview:  Qualifier: Diagnosis of  By: Amil Amen MD, Benjamine Mola  .  Hypertension   . INSOMNIA 09/21/2009   Qualifier: Diagnosis of  By: Amil Amen MD, Benjamine Mola    . Lumbar facet arthropathy 05/14/2016  . Lumbar spinal stenosis 02/11/2018  . Metatarsalgia of both feet 03/11/2017  . Migraine 09/13/2012   Overview:  IMPRESSION: possible abd migraine  . Mixed dyslipidemia 02/01/2019  . Nausea & vomiting 07/07/2017  . Nausea with vomiting, unspecified 09/14/2009   Qualifier: Diagnosis of  By: Shane Crutch, [name]   Formatting of this note might be different from the original. Overview:  Qualifier: Diagnosis of  By: Shane Crutch, [name]  . Neuropathy   . Nonspecific abnormal findings on radiological and examination of skull and head 09/13/2012  . Overweight (BMI 25.0-29.9) 07/21/2014  . POSITIVE PPD 07/30/2009   Annotation: CXR clear ?  diagnosed in 2/11?  On INH/pyridoxine Qualifier: Diagnosis of  By: Amil Amen MD, Benjamine Mola    . Pure hypercholesterolemia 11/27/2016  . Retention of urine 06/25/2011  .  Right flank pain   . TOBACCO ABUSE 09/21/2009   Qualifier: Diagnosis of  By: Amil Amen MD, Winona Legato of this note might be different from the original. Overview:  Qualifier: Diagnosis of  By: Amil Amen MD, Benjamine Mola  . TRANSAMINASES, SERUM, ELEVATED 09/21/2009   Qualifier: Diagnosis of  By: Amil Amen MD, Benjamine Mola    . Trigeminal neuralgia   . Tuberculin test reaction 07/30/2009   Formatting of this note might be different from the original. Overview:  Annotation: CXR clear ?  diagnosed in 2/11?  On INH/pyridoxine Qualifier: Diagnosis of  By: Amil Amen MD, Benjamine Mola  . Type 2 diabetes mellitus with hyperglycemia ([place]) 07/30/2009   Qualifier: Diagnosis of  By: Amil Amen MD, Winona Legato of this note might be different from the original. Overview:  Qualifier: Diagnosis of  By: Amil Amen MD, Benjamine Mola  . Type 2 diabetes mellitus with hyperglycemia, with long-term current use of insulin ([place]) 07/30/2009   Formatting of this note might be different from the original. Overview:  05/30/2015 A1C was 16.7% (!)  Overview:  05/30/2015 A1C was 16.7% (!) Formatting of this note might be different from the original. 05/30/2015 A1C was 16.7% (!)  . Type 2 diabetes mellitus with stage 3 chronic kidney disease ([place]) 09/14/2009   Qualifier: Diagnosis of  By: Trellis Paganini PA-c, [name]   . ULCER-GASTRIC 09/28/2009   Qualifier: Diagnosis of  By: Chester Holstein NP, Nevin Bloodgood    . URINARY TRACT INFECTION 09/21/2009   Qualifier: Diagnosis of  By: Amil Amen MD, Benjamine Mola    . VAGINITIS, CANDIDAL 12/25/2009   Qualifier: Diagnosis of  By: Amil Amen MD, Benjamine Mola    . Vitamin D deficiency 04/25/2013    Patient Active Problem List   Diagnosis Date Noted  . Cervical cancer ([place])   . Chronic back pain   . Coronary artery disease   . GERD (gastroesophageal reflux disease)   . Hyperlipidemia   . Hypertension   . Mixed dyslipidemia 02/01/2019  . Cardiac murmur 02/01/2019  . Lumbar spinal stenosis 02/11/2018  . Neuropathy  07/20/2017  . Right flank pain   . Nausea & vomiting 07/07/2017  . Acute cystitis 07/07/2017  . CAD (coronary artery disease) 07/07/2017  . Metatarsalgia of both feet 03/11/2017  . Diabetic gastroparesis associated with type 1 diabetes mellitus ([place]) 11/27/2016  . Pure hypercholesterolemia 11/27/2016  . Lumbar facet arthropathy 05/14/2016  . Chronic pain syndrome 01/23/2016  . Diabetic peripheral neuropathy ([place]) 01/23/2016  . Anxiety 01/22/2016  . History of cervical cancer 08/17/2015  . Depression  08/10/2015  . Overweight (BMI 25.0-29.9) 07/21/2014  . Chronic kidney disease, stage III (moderate) ([place]) 05/23/2014  . Diabetes type 2, uncontrolled ([place]) 05/23/2014  . Current use of insulin ([place]) 05/23/2014  . Vitamin D deficiency 04/25/2013  . Cerebrovascular disease 09/13/2012  . Migraine 09/13/2012  . Nonspecific abnormal findings on radiological and examination of skull and head 09/13/2012  . Retention of urine 06/25/2011  . ANKLE PAIN, LEFT 12/25/2009  . DYSURIA 12/25/2009  . VAGINITIS, CANDIDAL 12/25/2009  . ULCER-GASTRIC 09/28/2009  . DIABETIC PERIPHERAL NEUROPATHY 09/21/2009  . TOBACCO ABUSE 09/21/2009  . URINARY TRACT INFECTION 09/21/2009  . INSOMNIA 09/21/2009  . TRANSAMINASES, SERUM, ELEVATED 09/21/2009  . Elevation of level of transaminase or lactic acid dehydrogenase (LDH) 09/21/2009  . Type 2 diabetes mellitus with stage 3 chronic kidney disease ([place]) 09/14/2009  . Nausea with vomiting, unspecified 09/14/2009  . ABDOMINAL PAIN-EPIGASTRIC 09/14/2009  . Cardiomyopathy, secondary ([place]) 09/06/2009  . Cardiomyopathy, unspecified ([place]) 09/06/2009  . Essential hypertension 08/30/2009  . DIABETES MELLITUS, TYPE II, UNCONTROLLED 07/30/2009  . HLD (hyperlipidemia) 07/30/2009  . GERD 07/30/2009  . GASTROPARESIS 07/30/2009  . CHEST PAIN, ATYPICAL 07/30/2009  . POSITIVE PPD 07/30/2009  . Type 2 diabetes mellitus with hyperglycemia ([place]) 07/30/2009  . Gastric atony  07/30/2009  . Gastroesophageal reflux disease 07/30/2009  . Hyperlipidemia, unspecified 07/30/2009  . Type 2 diabetes mellitus with hyperglycemia, with long-term current use of insulin ([place]) 07/30/2009  . Tuberculin test reaction 07/30/2009    Past Surgical History:  Procedure Laterality Date  . ABDOMINAL HYSTERECTOMY     partial  . CHOLECYSTECTOMY       OB History   No obstetric history on file.     Family History  Problem Relation Age of Onset  . Diabetes Mother   . Diabetes Father     Social History   Tobacco Use  . Smoking status: Never Smoker  . Smokeless tobacco: Never Used  Vaping Use  . Vaping Use: Never used  Substance Use Topics  . Alcohol use: Yes    Comment: occ  . Drug use: No    Home Medications Prior to Admission medications   Medication Sig Start Date End Date Taking? Authorizing Provider  albuterol (VENTOLIN HFA) 108 (90 Base) MCG/ACT inhaler INHALE 2 PUFFS BY MOUTH EVERY 6 HOURS AS NEEDED 10/13/19   Saguier, Percell Miller, PA-C  Albuterol Sulfate (PROAIR RESPICLICK) 465 (90 Base) MCG/ACT AEPB Inhale 2 puffs into the lungs at bedtime as needed. 04/12/19   Saguier, Percell Miller, PA-C  amLODipine (NORVASC) 5 MG tablet Take 1 tablet by mouth once daily 04/03/20   Saguier, Percell Miller, PA-C  amoxicillin-clavulanate (AUGMENTIN) 875-125 MG tablet Take 1 tablet by mouth 2 (two) times daily. Patient not taking: Reported on 04/16/2020 04/09/20   Colon Branch, MD  aspirin 81 MG tablet Take 81 mg by mouth daily.    [provider]  atorvastatin (LIPITOR) 80 MG tablet Take 1 tablet (80 mg total) by mouth daily. 11/08/19   Saguier, Percell Miller, PA-C  azelastine (ASTELIN) 0.1 % nasal spray Place 2 sprays into both nostrils 2 (two) times daily. 04/09/20   Colon Branch, MD  busPIRone (BUSPAR) 15 MG tablet TAKE 1 TABLET BY MOUTH THREE TIMES DAILY 02/06/20   Saguier, Percell Miller, PA-C  carvedilol (COREG) 25 MG tablet TAKE 1 TABLET BY MOUTH TWICE DAILY WITH MEALS 02/02/20   Saguier, Percell Miller, PA-C   Cholecalciferol (VITAMIN D3) 400 units CAPS Take 400 mg by mouth daily.    [provider]  FARXIGA 10 MG TABS tablet Take 1 tablet by mouth once daily 07/21/19   Saguier, Percell Miller, PA-C  fluticasone [facility]) 50 MCG/ACT nasal spray Place 2 sprays into both nostrils daily. 09/27/19   Saguier, Percell Miller, PA-C  fluticasone furoate-vilanterol (BREO ELLIPTA) 100-25 MCG/INH AEPB Inhale 1 puff into the lungs daily. 04/12/19   Saguier, Percell Miller, PA-C  gabapentin (NEURONTIN) 300 MG capsule Take 2 capsules by mouth 3 (three) times daily. 08/17/19   [provider]  insulin degludec (TRESIBA) 100 UNIT/ML FlexTouch Pen Inject 40 Units into the skin daily at 10 pm.    [provider]  meloxicam (MOBIC) 7.5 MG tablet TAKE 1 TO 2 TABLETS BY MOUTH ONCE DAILY 03/16/20   Saguier, Percell Miller, PA-C  metFORMIN (GLUCOPHAGE-XR) 500 MG 24 hr tablet 2 (two) times daily. 01/03/19   [provider]  methocarbamol (ROBAXIN) 750 MG tablet TAKE 1 TABLET BY MOUTH EVERY 8 HOURS AS NEEDED 04/05/20   Saguier, Percell Miller, PA-C  neomycin-polymyxin-hydrocortisone (CORTISPORIN) OTIC solution Place 3 drops into the left ear 4 (four) times daily. 12/29/18   Saguier, Percell Miller, PA-C  nitroGLYCERIN (NITROSTAT) 0.4 MG SL tablet Place 1 tablet (0.4 mg total) under the tongue every 5 (five) minutes as needed. Patient not taking: Reported on 01/10/2020 02/01/19 06/13/19  Revankar, Reita Cliche, MD  oxyCODONE (OXY IR/ROXICODONE) 5 MG immediate release tablet 1 tab po twice daily as needed for severe pain Patient not taking: Reported on 04/16/2020 11/30/19   Saguier, Percell Miller, PA-C  polyethylene glycol (MIRALAX / Floria Raveling) packet Take 17 g by mouth daily. Patient not taking: Reported on 04/16/2020 07/12/17   Patrecia Pour, Christean Grief, MD  promethazine (PHENERGAN) 12.5 MG tablet TAKE 1 TABLET BY MOUTH EVERY 8 HOURS AS NEEDED FOR NAUSEA FOR VOMITING Patient not taking: Reported on 04/16/2020 04/05/20   Saguier, Percell Miller, PA-C  spironolactone (ALDACTONE) 25 MG  tablet Take 1 tablet by mouth once daily 07/21/19   Saguier, Percell Miller, PA-C    Allergies    Insulin aspart, Latex, and Morphine  Review of Systems   Review of Systems  Constitutional: Negative for chills and fever.  HENT: Positive for sinus pressure.   Respiratory: Negative for cough and shortness of breath.   Cardiovascular: Positive for chest pain. Negative for palpitations and leg swelling.  Gastrointestinal: Negative for abdominal pain, nausea and vomiting.  All other systems reviewed and are negative.   Physical Exam Updated Vital Signs BP (!) 147/98   Pulse 85   Temp 98 F (36.7 C) (Oral)   Resp 14   Ht 6' (1.829 m)   Wt 72.7 kg   SpO2 100%   BMI 21.74 kg/m   Physical Exam Vitals and nursing note reviewed.  Constitutional:      Appearance: She is not ill-appearing or diaphoretic.  HENT:     Head: Normocephalic and atraumatic.  Eyes:     Conjunctiva/sclera: Conjunctivae normal.  Cardiovascular:     Rate and Rhythm: Normal rate and regular rhythm.     Pulses:          Radial pulses are 2+ on the right side and 2+ on the left side.       Dorsalis pedis pulses are 2+ on the right side and 2+ on the left side.     Heart sounds: Normal heart sounds.  Pulmonary:     Effort: Pulmonary effort is normal.     Breath sounds: Normal breath sounds. No decreased breath sounds, wheezing, rhonchi or rales.  Chest:     Chest  wall: Tenderness present.  Abdominal:     Palpations: Abdomen is soft.     Tenderness: There is no abdominal tenderness. There is no guarding or rebound.  Musculoskeletal:     Cervical back: Neck supple.     Right lower leg: No tenderness. No edema.     Left lower leg: No tenderness. No edema.  Skin:    General: Skin is warm and dry.  Neurological:     Mental Status: She is alert.     ED Results / Procedures / Treatments   Labs (all labs ordered are listed, but only abnormal results are displayed) Labs Reviewed  BASIC METABOLIC PANEL - Abnormal;  Notable for the following components:      Result Value   Glucose, Bld 150 (*)    Creatinine, Ser 1.12 (*)    GFR, Estimated 58 (*)    All other components within normal limits  SARS CORONAVIRUS 2 (TAT 6-24 HRS)  CBC  D-DIMER, QUANTITATIVE  PREGNANCY, URINE  TROPONIN I (HIGH SENSITIVITY)  TROPONIN I (HIGH SENSITIVITY)    EKG EKG Interpretation  Date/Time:  Monday April 16 2020 16:29:35 EST Ventricular Rate:  87 PR Interval:  160 QRS Duration: 82 QT Interval:  380 QTC Calculation: 457 R Axis:   59 Text Interpretation: Normal sinus rhythm Septal infarct , age undetermined Abnormal ECG No significant change since last tracing Confirmed by Quintella Reichert [id number]) on 04/16/2020 7:37:17 PM   Radiology DG Chest 2 View  Result Date: 04/16/2020 CLINICAL DATA:  Chest pain for several hours EXAM: CHEST - 2 VIEW COMPARISON:  01/21/2020 FINDINGS: The heart size and mediastinal contours are within normal limits. Both lungs are clear. The visualized skeletal structures are unremarkable. IMPRESSION: No active cardiopulmonary disease. Electronically Signed   By: Inez Catalina M.D.   On: 04/16/2020 17:01    Procedures Procedures   Medications Ordered in ED Medications  aspirin chewable tablet 324 mg (324 mg Oral Given 04/16/20 1901)    ED Course  I have reviewed the triage vital signs and the nursing notes.  Pertinent labs & imaging results that were available during my care of the patient were reviewed by me and considered in my medical decision making (see chart for details).    MDM Rules/Calculators/A&P HEAR Score: 85                        55 year old female presenting to the ED with complaint of left sided chest pain radiating into R chest with intermittent tingling to R hand and left middle finger that began around 1 PM. Sent by her PCP to rule out MI as she had some nonspecific ST changes. On arrival to the ED pt is afebrile, nontachycardic, and nontachypneic. She appears mildly  anxious at this time however in NAD. She does have chest wall TTP. She had equal pulses bilaterally with a stable BP 147/98. She does have + Fhx of CAD < 50 years ago however she has also has sinus issues for the past 2 weeks and was around a brother during a long car ride to [place] who may or may not have COVID. EKG obtained in the ED - no acute ischemic changes. Does appear unchanged from previous; we were able to pull up EKG obtained by PCP's office and looks similar to previous tracing as well without obvious nonspecific ST changes. CXR clear. CBC without leukocytosis and Hgb stable at 13.6. BMP with creatinine 1.12 which  does appear slightly elevated compared to baseline. Troponin of 4. Will plan to repeat troponin and add a Dimer at this time with pts recent long travel as well as having sinus pressure for 2 weeks; question if she had COVID 2 weeks ago and now presenting signs concerning for PE however pt is nontachycardic and denies SOB. If dimer negative do not feel pt requires CTA. Question anxiety vs stress as well given pt is complaining of atypical tingling in her right hand and middle finger on left hand; she does report she has been stressed recently.  Lab Results  Component Value Date   CREATININE 1.12 (H) 04/16/2020   CREATININE 0.92 04/13/2020   CREATININE 1.05 11/30/2019   Repeat troponin of 4 D dimer negative at 0.47  On reevaluation pt resting comfortably however continues to complain of 5/10 pain. Heart score of 4. Have discussed admission for observation vs discharge with close follow up with cardiology; pt feels comfortable following up and given atypical symptoms I feel comfortable as well. Have discussed case with Dr. Ralene Bathe attending physician who agrees with plan. Will discharge home at this time; have ordered COVID test per patient request given she was around her brother this weekend who has nasal congestion. Pt instructed to self isolate until she receives her results.  She is also instructed on strict return precautions including worsening pain, shortness of breath, vomiting, pain radiating down one arm, passing out, or any other new/concerning symptoms.   This note was prepared using Dragon voice recognition software and may include unintentional dictation errors due to the inherent limitations of voice recognition software.  Final Clinical Impression(s) / ED Diagnoses Final diagnoses:  Nonspecific chest pain    Rx / DC Orders ED Discharge Orders    None       Discharge Instructions     Your work up was reassuring today however it is recommended that you follow up with your cardiologist in the next 1-2 days for further evaluation  We have tested you for COVID - please await your results at home. We will call you if you test positive. You can also log into your mychart and check the results that way.   Return to the ED IMMEDIATELY for any worsening symptoms including worsening pain, vomiting, shortness of breath, passing out, or any other new/concerning symptoms.        Eustaquio Maize, PA-C 04/16/20 2002    Quintella Reichert, MD 04/17/20 1438

## 2020-04-17 ENCOUNTER — Encounter: Payer: Self-pay | Admitting: Diagnostic Neuroimaging

## 2020-04-17 ENCOUNTER — Other Ambulatory Visit: Payer: Self-pay

## 2020-04-17 ENCOUNTER — Encounter: Payer: Self-pay | Admitting: Medical

## 2020-04-17 ENCOUNTER — Telehealth: Payer: Self-pay | Admitting: *Deleted

## 2020-04-17 ENCOUNTER — Ambulatory Visit (INDEPENDENT_AMBULATORY_CARE_PROVIDER_SITE_OTHER): Payer: 59 | Admitting: Diagnostic Neuroimaging

## 2020-04-17 VITALS — BP 112/76 | HR 87 | Ht 72.0 in | Wt 160.2 lb

## 2020-04-17 DIAGNOSIS — G43109 Migraine with aura, not intractable, without status migrainosus: Secondary | ICD-10-CM | POA: Diagnosis not present

## 2020-04-17 DIAGNOSIS — R519 Headache, unspecified: Secondary | ICD-10-CM | POA: Diagnosis not present

## 2020-04-17 LAB — SARS CORONAVIRUS 2 (TAT 6-24 HRS): SARS Coronavirus 2: NEGATIVE

## 2020-04-17 MED ORDER — TOPIRAMATE 50 MG PO TABS: 50.0000 mg | ORAL_TABLET | Freq: Two times a day (BID) | ORAL | 12 refills | Status: DC

## 2020-04-17 MED ORDER — RIZATRIPTAN BENZOATE 10 MG PO TBDP: 10.0000 mg | ORAL_TABLET | ORAL | 11 refills | Status: DC | PRN

## 2020-04-17 MED ORDER — NURTEC 75 MG PO TBDP: 75.0000 mg | ORAL_TABLET | Freq: Every day | ORAL | 6 refills | Status: DC | PRN

## 2020-04-17 NOTE — Patient Instructions (Signed)
-   start topiramate 50mg  at bedtime; after 1-2 weeks increase to 50mg  twice a day; drink plenty of water  - start rimegepant (Nurtec) 75mg  as needed for breakthrough headache; max 8 per month

## 2020-04-17 NOTE — Addendum Note (Signed)
Addended by: Anabel Halon on: 04/17/2020 12:36 PM   Modules accepted: Orders

## 2020-04-17 NOTE — Telephone Encounter (Signed)
Patient was no show for new patient appointment today. 

## 2020-04-17 NOTE — Progress Notes (Signed)
[facility]  PATIENT: [name] DOB: Jul 16, 1965  REFERRING CLINICIAN: Saguier, Percell Miller, PA-C HISTORY FROM: patient  REASON FOR VISIT: new consult    HISTORICAL  CHIEF COMPLAINT:  Chief Complaint  Patient presents with  . Trigeminal neuralgia    Rm 7 New Pt, ED referral     HISTORY OF PRESENT ILLNESS:   55 year old female here for evaluation of headaches.    Patient has long history of migraine headaches since 55 years old with unilateral throbbing sensation, nausea and sensitive to light.  She was on Imitrex injections in the past with good relief.  By her 80s her headaches had resolved, around the time of her divorce.  The last 3 weeks patient has had recurrence of similar but milder left-sided headaches with nausea and sensitivity to light.  Headaches are throbbing sensation.  She tried over-the-counter medication without relief.  She went to the emergency room for evaluation and was treated with migraine cocktail which seem to help a little bit.  Possibility of trigeminal neuralgia was raised by PCP and therefore neurology consultation was recommended. Patient has constant pain and headaches.  She denies any brief lancinating electric shocklike symptoms.  No numbness or tingling.  Of note patient has been under significantly increased stress over the past 3 months due to her finding out that fraudulent activity had been going on with her bank account, credit card, online shopping apps on her phone.  She thinks that a neighbor may have hacked into her phone but she is not sure.  She had overheard neighbors talking about buying new clothes and items and patient started to get suspicious.  She was able to hear voices through the ventilation system in her apartment, which made her think that some of her neighbors may have been taking advantage of her.  She also became paranoid that neighbors had installed cameras in her home and were watching her.  Her anxiety  level significantly increased.  Therefore she abandon her apartment with all of the items there and has been living with various friends.  She cannot tell me exactly where she is staying but she says that she sleeps on the sofa as a various friends here and there.  She denies being homeless or living in a shelter.  She has not returned to her apartment to claim her belongings.  Patient tells me that she is trying to get her things in order and plans to get a clean start by moving back to [place] with her family.     REVIEW OF SYSTEMS: Full 14 system review of systems performed and negative with exception of: as per HPI.  ALLERGIES: Allergies  Allergen Reactions  . Insulin Aspart Other (See Comments) and Swelling    adverse reaction to Novolog   . Latex Itching and Rash  . Morphine Itching and Rash    HOME MEDICATIONS: Outpatient Medications Prior to Visit  Medication Sig Dispense Refill  . albuterol (VENTOLIN HFA) 108 (90 Base) MCG/ACT inhaler INHALE 2 PUFFS BY MOUTH EVERY 6 HOURS AS NEEDED 18 g 0  . Albuterol Sulfate (PROAIR RESPICLICK) 361 (90 Base) MCG/ACT AEPB Inhale 2 puffs into the lungs at bedtime as needed. 90 each 2  . amLODipine (NORVASC) 5 MG tablet Take 1 tablet by mouth once daily 30 tablet 0  . aspirin 81 MG tablet Take 81 mg by mouth daily.    Marland Kitchen atorvastatin (LIPITOR) 80 MG tablet Take 1 tablet (80 mg total) by mouth daily.  30 tablet 3  . azelastine (ASTELIN) 0.1 % nasal spray Place 2 sprays into both nostrils 2 (two) times daily. 30 mL 1  . busPIRone (BUSPAR) 15 MG tablet TAKE 1 TABLET BY MOUTH THREE TIMES DAILY 21 tablet 0  . carvedilol (COREG) 25 MG tablet TAKE 1 TABLET BY MOUTH TWICE DAILY WITH MEALS 180 tablet 0  . Cholecalciferol (VITAMIN D3) 400 units CAPS Take 400 mg by mouth daily.    Marland Kitchen FARXIGA 10 MG TABS tablet Take 1 tablet by mouth once daily 90 tablet 0  . fluticasone (FLONASE) 50 MCG/ACT nasal spray Place 2 sprays into both nostrils daily. 16 g 1  .  fluticasone furoate-vilanterol (BREO ELLIPTA) 100-25 MCG/INH AEPB Inhale 1 puff into the lungs daily. 100 each 2  . gabapentin (NEURONTIN) 300 MG capsule Take 2 capsules by mouth 3 (three) times daily.    . insulin degludec (TRESIBA) 100 UNIT/ML FlexTouch Pen Inject 40 Units into the skin daily at 10 pm.    . meloxicam (MOBIC) 7.5 MG tablet TAKE 1 TO 2 TABLETS BY MOUTH ONCE DAILY 30 tablet 0  . metFORMIN (GLUCOPHAGE-XR) 500 MG 24 hr tablet 2 (two) times daily.    . methocarbamol (ROBAXIN) 750 MG tablet TAKE 1 TABLET BY MOUTH EVERY 8 HOURS AS NEEDED 15 tablet 0  . oxyCODONE (OXY IR/ROXICODONE) 5 MG immediate release tablet 1 tab po twice daily as needed for severe pain 60 tablet 0  . promethazine (PHENERGAN) 12.5 MG tablet TAKE 1 TABLET BY MOUTH EVERY 8 HOURS AS NEEDED FOR NAUSEA FOR VOMITING 20 tablet 0  . nitroGLYCERIN (NITROSTAT) 0.4 MG SL tablet Place 1 tablet (0.4 mg total) under the tongue every 5 (five) minutes as needed. (Patient not taking: Reported on 01/10/2020) 25 tablet 3  . spironolactone (ALDACTONE) 25 MG tablet Take 1 tablet by mouth once daily (Patient not taking: Reported on 04/17/2020) 30 tablet 0  . amoxicillin-clavulanate (AUGMENTIN) 875-125 MG tablet Take 1 tablet by mouth 2 (two) times daily. (Patient not taking: Reported on 04/16/2020) 14 tablet 0  . neomycin-polymyxin-hydrocortisone (CORTISPORIN) OTIC solution Place 3 drops into the left ear 4 (four) times daily. 10 mL 0  . polyethylene glycol (MIRALAX / GLYCOLAX) packet Take 17 g by mouth daily. (Patient not taking: Reported on 04/16/2020) 14 each 0   No facility-administered medications prior to visit.    PAST MEDICAL HISTORY: Past Medical History:  Diagnosis Date  . ABDOMINAL PAIN-EPIGASTRIC 09/14/2009   Qualifier: Diagnosis of  By: Trellis Paganini PA-c, [name]   . Acute cystitis 07/07/2017  . ANKLE PAIN, LEFT 12/25/2009   Qualifier: Diagnosis of  By: Amil Amen MD, Benjamine Mola    . Anxiety 01/22/2016  . CAD (coronary artery disease)  07/07/2017  . Cardiac murmur 02/01/2019  . Cardiomyopathy, secondary ([place]) 09/06/2009   Qualifier: Diagnosis of  By: Arvid Right   Formatting of this note might be different from the original. Overview:  Qualifier: Diagnosis of  By: Arvid Right  . Cardiomyopathy, unspecified ([place]) 09/06/2009   Formatting of this note might be different from the original. Overview:  Overview:  Qualifier: Diagnosis of  By: Arvid Right  Overview:  Overview:  Qualifier: Diagnosis of  By: Arvid Right Formatting of this note might be different from the original. Overview:  Qualifier: Diagnosis of  By: Arvid Right  . Cerebrovascular disease 09/13/2012  . Cervical cancer ([place])    had surgery in 2001.  . CHEST PAIN, ATYPICAL  07/30/2009   Qualifier: Diagnosis of  By: Amil Amen MD, Winona Legato of this note might be different from the original. Overview:  Qualifier: Diagnosis of  By: Amil Amen MD, Benjamine Mola  . Chronic back pain   . Chronic kidney disease, stage III (moderate) ([place]) 05/23/2014  . Chronic pain syndrome 01/23/2016  . Coronary artery disease    30% lesions noted  . Current use of insulin ([place]) 05/23/2014  . Depression   . Diabetes mellitus   . DIABETES MELLITUS, TYPE II, UNCONTROLLED 07/30/2009   Qualifier: Diagnosis of  By: Amil Amen MD, Benjamine Mola    . Diabetes type 2, uncontrolled ([place]) 05/23/2014  . Diabetic gastroparesis associated with type 1 diabetes mellitus ([place]) 11/27/2016  . DIABETIC PERIPHERAL NEUROPATHY 09/21/2009   Qualifier: Diagnosis of  By: Amil Amen MD, Benjamine Mola    . Diabetic peripheral neuropathy ([place]) 01/23/2016  . Dysuria 12/25/2009   Qualifier: Diagnosis of  By: Amil Amen MD, Benjamine Mola    . Elevation of level of transaminase or lactic acid dehydrogenase (LDH) 09/21/2009   Formatting of this note might be different from the original. Overview:  Qualifier: Diagnosis of  By: Amil Amen MD, Benjamine Mola  .  Essential hypertension 08/30/2009   Qualifier: Diagnosis of  By: Lovette Cliche, CNA, Christy    . Gastric atony 07/30/2009   Formatting of this note might be different from the original. Overview:  Qualifier: Diagnosis of  By: Amil Amen MD, Benjamine Mola  . Gastroesophageal reflux disease 07/30/2009   Formatting of this note might be different from the original. Overview:  Overview:  Qualifier: Diagnosis of  By: Amil Amen MD, Elby Showers: Diagnosis of  By: Chester Holstein NP, Ellwood Handler of this note might be different from the original. Overview:  Qualifier: Diagnosis of  By: Amil Amen MD, Elby Showers: Diagnosis of  By: Chester Holstein NP, Nevin Bloodgood  . Gastroparesis 07/30/2009   Qualifier: Diagnosis of  By: Amil Amen MD, Benjamine Mola    . GERD 07/30/2009   Qualifier: Diagnosis of  By: Amil Amen MD, Elby Showers: Diagnosis of  By: Chester Holstein NP, Nevin Bloodgood    . GERD (gastroesophageal reflux disease)   . History of cervical cancer 08/17/2015   Status post partial hysterectomy  Formatting of this note might be different from the original. Status post partial hysterectomy  . HLD (hyperlipidemia) 07/30/2009   Qualifier: Diagnosis of  By: Amil Amen MD, Benjamine Mola    . Hyperlipidemia   . Hyperlipidemia, unspecified 07/30/2009   Formatting of this note might be different from the original. Overview:  Qualifier: Diagnosis of  By: Amil Amen MD, Winona Legato of this note might be different from the original. Overview:  Qualifier: Diagnosis of  By: Amil Amen MD, Benjamine Mola  . Hypertension   . INSOMNIA 09/21/2009   Qualifier: Diagnosis of  By: Amil Amen MD, Benjamine Mola    . Lumbar facet arthropathy 05/14/2016  . Lumbar spinal stenosis 02/11/2018  . Metatarsalgia of both feet 03/11/2017  . Migraine 09/13/2012   Overview:  IMPRESSION: possible abd migraine  . Mixed dyslipidemia 02/01/2019  . Nausea & vomiting 07/07/2017  . Nausea with vomiting, unspecified 09/14/2009   Qualifier: Diagnosis of  By: Shane Crutch, [name]   Formatting  of this note might be different from the original. Overview:  Qualifier: Diagnosis of  By: Shane Crutch, [name]  . Neuropathy   . Nonspecific abnormal findings on radiological and examination of skull and head 09/13/2012  . Overweight (BMI 25.0-29.9) 07/21/2014  . POSITIVE PPD 07/30/2009   Annotation: CXR clear ?  diagnosed in 2/11?  On INH/pyridoxine Qualifier: Diagnosis of  By: Amil Amen MD, Benjamine Mola    . Pure hypercholesterolemia 11/27/2016  . Retention of urine 06/25/2011  . Right flank pain   . TOBACCO ABUSE 09/21/2009   Qualifier: Diagnosis of  By: Amil Amen MD, Winona Legato of this note might be different from the original. Overview:  Qualifier: Diagnosis of  By: Amil Amen MD, Benjamine Mola  . TRANSAMINASES, SERUM, ELEVATED 09/21/2009   Qualifier: Diagnosis of  By: Amil Amen MD, Benjamine Mola    . Trigeminal neuralgia   . Tuberculin test reaction 07/30/2009   Formatting of this note might be different from the original. Overview:  Annotation: CXR clear ?  diagnosed in 2/11?  On INH/pyridoxine Qualifier: Diagnosis of  By: Amil Amen MD, Benjamine Mola  . Type 2 diabetes mellitus with hyperglycemia ([place]) 07/30/2009   Qualifier: Diagnosis of  By: Amil Amen MD, Winona Legato of this note might be different from the original. Overview:  Qualifier: Diagnosis of  By: Amil Amen MD, Benjamine Mola  . Type 2 diabetes mellitus with hyperglycemia, with long-term current use of insulin ([place]) 07/30/2009   Formatting of this note might be different from the original. Overview:  05/30/2015 A1C was 16.7% (!)  Overview:  05/30/2015 A1C was 16.7% (!) Formatting of this note might be different from the original. 05/30/2015 A1C was 16.7% (!)  . Type 2 diabetes mellitus with stage 3 chronic kidney disease ([place]) 09/14/2009   Qualifier: Diagnosis of  By: Trellis Paganini PA-c, [name]   . ULCER-GASTRIC 09/28/2009   Qualifier: Diagnosis of  By: Chester Holstein NP, Nevin Bloodgood    . URINARY TRACT INFECTION 09/21/2009   Qualifier: Diagnosis of  By:  Amil Amen MD, Benjamine Mola    . VAGINITIS, CANDIDAL 12/25/2009   Qualifier: Diagnosis of  By: Amil Amen MD, Benjamine Mola    . Vitamin D deficiency 04/25/2013    PAST SURGICAL HISTORY: Past Surgical History:  Procedure Laterality Date  . ABDOMINAL HYSTERECTOMY     partial  . CHOLECYSTECTOMY      FAMILY HISTORY: Family History  Problem Relation Age of Onset  . Diabetes Mother   . Diabetes Father     SOCIAL HISTORY: Social History   Socioeconomic History  . Marital status: Single    Spouse name: Not on file  . Number of children: Not on file  . Years of education: Not on file  . Highest education level: Not on file  Occupational History  . Not on file  Tobacco Use  . Smoking status: Never Smoker  . Smokeless tobacco: Never Used  Vaping Use  . Vaping Use: Never used  Substance and Sexual Activity  . Alcohol use: Yes    Comment: occ  . Drug use: No  . Sexual activity: Not on file  Other Topics Concern  . Not on file  Social History Narrative  . Not on file   Social Determinants of Health   Financial Resource Strain: Not on file  Food Insecurity: Not on file  Transportation Needs: Not on file  Physical Activity: Not on file  Stress: Not on file  Social Connections: Not on file  Intimate Partner Violence: Not on file     PHYSICAL EXAM  GENERAL EXAM/CONSTITUTIONAL: Vitals:  Vitals:   04/17/20 1146  BP: 112/76  Pulse: 87  Weight: 160 lb 3.2 oz (72.7 kg)  Height: 6' (1.829 m)     Body mass index is 21.73 kg/m. Wt Readings from Last 3 Encounters:  04/17/20 160 lb 3.2 oz (72.7 kg)  04/16/20 160 lb 4.4 oz (72.7 kg)  04/16/20 160 lb 3.2 oz (72.7 kg)     Patient is in no distress; well developed, nourished and groomed; neck is supple  CARDIOVASCULAR:  Examination of carotid arteries is normal; no carotid bruits  Regular rate and rhythm, no murmurs  Examination of peripheral vascular system by observation and palpation is  normal  EYES:  Ophthalmoscopic exam of optic discs and posterior segments is normal; no papilledema or hemorrhages  No exam data present  MUSCULOSKELETAL:  Gait, strength, tone, movements noted in Neurologic exam below  NEUROLOGIC: MENTAL STATUS:  No flowsheet data found.  awake, alert, oriented to person, place and time  recent and remote memory intact  normal attention and concentration  language fluent, comprehension intact, naming intact  fund of knowledge appropriate  CRANIAL NERVE:   2nd - no papilledema on fundoscopic exam  2nd, 3rd, 4th, 6th - pupils equal and reactive to light, visual fields full to confrontation, extraocular muscles intact, no nystagmus  5th - facial sensation symmetric  7th - facial strength symmetric  8th - hearing intact  9th - palate elevates symmetrically, uvula midline  11th - shoulder shrug symmetric  12th - tongue protrusion midline  MOTOR:   normal bulk and tone, full strength in the BUE, BLE  SENSORY:   normal and symmetric to light touch, temperature, vibration  COORDINATION:   finger-nose-finger, fine finger movements normal  REFLEXES:   deep tendon reflexes present and symmetric  GAIT/STATION:   narrow based gait     DIAGNOSTIC DATA (LABS, IMAGING, TESTING) - I reviewed patient records, labs, notes, testing and imaging myself where available.  Lab Results  Component Value Date   WBC 7.0 04/16/2020   HGB 13.6 04/16/2020   HCT 41.8 04/16/2020   MCV 92.5 04/16/2020   PLT 287 04/16/2020      Component Value Date/Time   NA 139 04/16/2020 1638   K 4.2 04/16/2020 1638   CL 103 04/16/2020 1638   CO2 26 04/16/2020 1638   GLUCOSE 150 (H) 04/16/2020 1638   BUN 20 04/16/2020 1638   CREATININE 1.12 (H) 04/16/2020 1638   CREATININE 1.05 11/30/2019 1220   CALCIUM 9.8 04/16/2020 1638   PROT 8.5 (H) 11/30/2019 1220   ALBUMIN 4.6 06/03/2019 1919   AST 23 11/30/2019 1220   ALT 21 11/30/2019 1220    ALKPHOS 95 06/03/2019 1919   BILITOT 0.5 11/30/2019 1220   GFRNONAA 58 (L) 04/16/2020 1638   GFRAA >60 06/03/2019 1919   Lab Results  Component Value Date   CHOL 170 11/30/2019   HDL 61 11/30/2019   LDLCALC 90 11/30/2019   TRIG 91 11/30/2019   CHOLHDL 2.8 11/30/2019   Lab Results  Component Value Date   HGBA1C 7.7 (H) 07/09/2017   No results found for: VITAMINB12 No results found for: TSH    04/13/20 CT orbits - No significant abnormality identified.    ASSESSMENT AND PLAN  55 y.o. year old female here with migraine with aura since age 72 years old, with recurrence of similar symptoms 3 to 4 weeks ago in the setting of significant increased stress.  Likely represents recurrence of migraine.  Signs symptoms not consistent with trigeminal neuralgia.  Dx:  1. Migraine with aura and without status migrainosus, not intractable       PLAN:  NEW ONSET PAIN / HEADACHE / ANXIETY - check MRI brain   MIGRAINE WITH AURA  MIGRAINE PREVENTION  LIFESTYLE CHANGES -Stop or avoid  smoking -Decrease or avoid caffeine / alcohol -Eat and sleep on a regular schedule -Exercise several times per week - start topiramate 50mg  at bedtime; after 1-2 weeks increase to 50mg  twice a day; drink plenty of water  MIGRAINE RESCUE  - ibuprofen, tylenol as needed - avoid triptans (? history of CAD) - start rimegepant (Nurtec) 75mg  as needed for breakthrough headache; max 8 per month  STRESS / ANXIETY / PARANOIA  - apparently had fraudulent activity on her bank card, amazon account, instacart account; she working with her bank and credit card to recover funds - patient is paranoid that people have installed cameras in her apartment and have been recording her; she does not have any proof of this; now has abandoned her apartment and has been living at various friend's houses, sleeping on their sofas (?) - she is planning to move back to [facility] for a "clean start" in about 1-2 months   Meds ordered  this encounter  Medications  . DISCONTD: rizatriptan (MAXALT-MLT) 10 MG disintegrating tablet    Sig: Take 1 tablet (10 mg total) by mouth as needed for migraine. May repeat in 2 hours if needed    Dispense:  9 tablet    Refill:  11  . topiramate (TOPAMAX) 50 MG tablet    Sig: Take 1 tablet (50 mg total) by mouth 2 (two) times daily.    Dispense:  60 tablet    Refill:  12  . Rimegepant Sulfate (NURTEC) 75 MG TBDP    Sig: Take 75 mg by mouth daily as needed.    Dispense:  8 tablet    Refill:  6   Orders Placed This Encounter  Procedures  . MR BRAIN W WO CONTRAST   Return in about 6 months (around 10/18/2020) for with NP (Amy Lomax).    Penni Bombard, MD 07/11/5181, 43:73 AM Certified in Neurology, Neurophysiology and Neuroimaging  [facility] [address], [place], [place] [address] [phone]

## 2020-04-18 ENCOUNTER — Telehealth: Payer: Self-pay | Admitting: Diagnostic Neuroimaging

## 2020-04-18 ENCOUNTER — Ambulatory Visit: Payer: 59 | Admitting: Cardiology

## 2020-04-18 NOTE — Telephone Encounter (Signed)
[place]: [id number] (exp. 04/18/20 to 06/17/20) sent a message to [place] at [facility]. She will reach out to the patient to schedule.

## 2020-04-18 NOTE — Telephone Encounter (Signed)
Do you want to se pt today?

## 2020-04-19 ENCOUNTER — Other Ambulatory Visit: Payer: Self-pay

## 2020-04-19 ENCOUNTER — Encounter: Payer: Self-pay | Admitting: Family Medicine

## 2020-04-19 ENCOUNTER — Ambulatory Visit (INDEPENDENT_AMBULATORY_CARE_PROVIDER_SITE_OTHER): Payer: 59 | Admitting: Family Medicine

## 2020-04-19 VITALS — BP 142/98 | HR 98 | Temp 97.8°F | Resp 17 | Ht 72.0 in | Wt 160.0 lb

## 2020-04-19 DIAGNOSIS — K921 Melena: Secondary | ICD-10-CM | POA: Diagnosis not present

## 2020-04-19 DIAGNOSIS — R82998 Other abnormal findings in urine: Secondary | ICD-10-CM

## 2020-04-19 DIAGNOSIS — R109 Unspecified abdominal pain: Secondary | ICD-10-CM | POA: Diagnosis not present

## 2020-04-19 LAB — COMPREHENSIVE METABOLIC PANEL
ALT: 14 U/L (ref 0–35)
AST: 16 U/L (ref 0–37)
Albumin: 4.3 g/dL (ref 3.5–5.2)
Alkaline Phosphatase: 93 U/L (ref 39–117)
BUN: 20 mg/dL (ref 6–23)
CO2: 27 mEq/L (ref 19–32)
Calcium: 9.3 mg/dL (ref 8.4–10.5)
Chloride: 105 mEq/L (ref 96–112)
Creatinine, Ser: 0.99 mg/dL (ref 0.40–1.20)
GFR: 64.28 mL/min (ref >60.00)
Glucose, Bld: 157 mg/dL — ABNORMAL HIGH (ref 70–99)
Potassium: 4.2 mEq/L (ref 3.5–5.1)
Sodium: 140 mEq/L (ref 135–145)
Total Bilirubin: 0.5 mg/dL (ref 0.2–1.2)
Total Protein: 7.4 g/dL (ref 6.0–8.3)

## 2020-04-19 LAB — CBC
HCT: 39.6 % (ref 36.0–46.0)
Hemoglobin: 13.2 g/dL (ref 12.0–15.0)
MCHC: 33.2 g/dL (ref 30.0–36.0)
MCV: 91.1 fl (ref 78.0–100.0)
Platelets: 255 10*3/uL (ref 150.0–400.0)
RBC: 4.35 Mil/uL (ref 3.87–5.11)
RDW: 15.7 % — ABNORMAL HIGH (ref 11.5–15.5)
WBC: 5.3 10*3/uL (ref 4.0–10.5)

## 2020-04-19 LAB — POCT URINALYSIS DIP (MANUAL ENTRY)
Bilirubin, UA: NEGATIVE
Blood, UA: NEGATIVE
Glucose, UA: >=1000 mg/dL — AB
Ketones, POC UA: NEGATIVE mg/dL
Leukocytes, UA: NEGATIVE
Nitrite, UA: NEGATIVE
Spec Grav, UA: 1.02 (ref 1.010–1.025)
Urobilinogen, UA: 0.2 E.U./dL
pH, UA: 6 (ref 5.0–8.0)

## 2020-04-19 LAB — IFOBT (OCCULT BLOOD): IFOBT: NEGATIVE

## 2020-04-19 NOTE — Telephone Encounter (Signed)
Patient is scheduled at [facility] for 04/28/20.

## 2020-04-19 NOTE — Progress Notes (Addendum)
[name], [name], [name] [phone]  Date:  04/19/2020   Name:  [name]   DOB:  01/13/1966   MRN:  [id number]  PCP:  Mackie Pai, PA-C    Chief Complaint: Hematuria (Stabbing back pain) and Blood In Stools (Vomiting, started yesterday, no fever/)   History of Present Illness:  [name] is a 55 y.o. very pleasant female patient who presents with the following:  Primary patient of my partner Mackie Pai, here today with concern of bleeding in stool and dark-colored urine History of hypertension, hyperlipidemia, diabetes, CAD I have not seen her myself previously  Her DM is managed by [facility] endocrinology last visit in November   She had COVID-19 in December and underwent infusion therapy She was seen in the emergency department on March 7 with chest pain, she was evaluated and released home with plans for her to see cardiology in short order.  She was also seen by her neurologist, Dr. Leta Baptist 2 days ago-March 8-to follow-up on migraine headaches He had her start on Nurtec as needed Yesterday pt noted blood in her stool- bright red, was present in the stool Also, her urine appeared dark but not bloody Today her urine is still darker than typical but improved  She also noticed a stabbing back pain on the left side only yesterday- this was quite usual for her  The back pain has now resolved She had nausea and vomiting yesterday- she was supposed to see her cardiologist but she was not able to make it due to her symptoms, this has been rescheduled She tends to have some back pain but this was not like her typical back pain  Never had a kidney stone that she is aware of No vomiting today She did have a BM today- no blood in her stool as far as she can tell  No colonoscopy done as of yet  - the chart says she had a colon in 2019 but pt correctly state this was jut an  endoscopy.  Colonoscopy was planned but not completed due to food in stomach  Lab Results  Component Value Date   HGBA1C 7.7 (H) 07/09/2017   It appears she is well overdue for A1c  Patient Active Problem List   Diagnosis Date Noted  . Cervical cancer ([place])   . Chronic back pain   . Coronary artery disease   . GERD (gastroesophageal reflux disease)   . Hyperlipidemia   . Hypertension   . Mixed dyslipidemia 02/01/2019  . Cardiac murmur 02/01/2019  . Lumbar spinal stenosis 02/11/2018  . Neuropathy 07/20/2017  . Right flank pain   . Nausea & vomiting 07/07/2017  . Acute cystitis 07/07/2017  . CAD (coronary artery disease) 07/07/2017  . Metatarsalgia of both feet 03/11/2017  . Diabetic gastroparesis associated with type 1 diabetes mellitus ([place]) 11/27/2016  . Pure hypercholesterolemia 11/27/2016  . Lumbar facet arthropathy 05/14/2016  . Chronic pain syndrome 01/23/2016  . Diabetic peripheral neuropathy ([place]) 01/23/2016  . Anxiety 01/22/2016  . History of cervical cancer 08/17/2015  . Depression 08/10/2015  . Overweight (BMI 25.0-29.9) 07/21/2014  . Chronic kidney disease, stage III (moderate) ([place]) 05/23/2014  . Diabetes type 2, uncontrolled ([place]) 05/23/2014  . Current use of insulin ([place]) 05/23/2014  . Vitamin D deficiency 04/25/2013  . Cerebrovascular disease 09/13/2012  . Migraine 09/13/2012  . Nonspecific abnormal findings on radiological and examination of  skull and head 09/13/2012  . Retention of urine 06/25/2011  . ANKLE PAIN, LEFT 12/25/2009  . DYSURIA 12/25/2009  . VAGINITIS, CANDIDAL 12/25/2009  . ULCER-GASTRIC 09/28/2009  . DIABETIC PERIPHERAL NEUROPATHY 09/21/2009  . TOBACCO ABUSE 09/21/2009  . URINARY TRACT INFECTION 09/21/2009  . INSOMNIA 09/21/2009  . TRANSAMINASES, SERUM, ELEVATED 09/21/2009  . Elevation of level of transaminase or lactic acid dehydrogenase (LDH) 09/21/2009  . Type 2 diabetes mellitus with stage 3 chronic kidney disease ([place]) 09/14/2009   . Nausea with vomiting, unspecified 09/14/2009  . ABDOMINAL PAIN-EPIGASTRIC 09/14/2009  . Cardiomyopathy, secondary ([place]) 09/06/2009  . Cardiomyopathy, unspecified ([place]) 09/06/2009  . Essential hypertension 08/30/2009  . DIABETES MELLITUS, TYPE II, UNCONTROLLED 07/30/2009  . HLD (hyperlipidemia) 07/30/2009  . GERD 07/30/2009  . GASTROPARESIS 07/30/2009  . CHEST PAIN, ATYPICAL 07/30/2009  . POSITIVE PPD 07/30/2009  . Type 2 diabetes mellitus with hyperglycemia ([place]) 07/30/2009  . Gastric atony 07/30/2009  . Gastroesophageal reflux disease 07/30/2009  . Hyperlipidemia, unspecified 07/30/2009  . Type 2 diabetes mellitus with hyperglycemia, with long-term current use of insulin ([place]) 07/30/2009  . Tuberculin test reaction 07/30/2009    Past Medical History:  Diagnosis Date  . ABDOMINAL PAIN-EPIGASTRIC 09/14/2009   Qualifier: Diagnosis of  By: Trellis Paganini PA-c, [name]   . Acute cystitis 07/07/2017  . ANKLE PAIN, LEFT 12/25/2009   Qualifier: Diagnosis of  By: Amil Amen MD, Benjamine Mola    . Anxiety 01/22/2016  . CAD (coronary artery disease) 07/07/2017  . Cardiac murmur 02/01/2019  . Cardiomyopathy, secondary ([place]) 09/06/2009   Qualifier: Diagnosis of  By: Arvid Right   Formatting of this note might be different from the original. Overview:  Qualifier: Diagnosis of  By: Arvid Right  . Cardiomyopathy, unspecified ([place]) 09/06/2009   Formatting of this note might be different from the original. Overview:  Overview:  Qualifier: Diagnosis of  By: Arvid Right  Overview:  Overview:  Qualifier: Diagnosis of  By: Arvid Right Formatting of this note might be different from the original. Overview:  Qualifier: Diagnosis of  By: Arvid Right  . Cerebrovascular disease 09/13/2012  . Cervical cancer ([place])    had surgery in 2001.  . CHEST PAIN, ATYPICAL 07/30/2009   Qualifier: Diagnosis of  By: Amil Amen MD, Winona Legato of this note might be different from the original. Overview:  Qualifier: Diagnosis of  By: Amil Amen MD, Benjamine Mola  . Chronic back pain   . Chronic kidney disease, stage III (moderate) ([place]) 05/23/2014  . Chronic pain syndrome 01/23/2016  . Coronary artery disease    30% lesions noted  . Current use of insulin ([place]) 05/23/2014  . Depression   . Diabetes mellitus   . DIABETES MELLITUS, TYPE II, UNCONTROLLED 07/30/2009   Qualifier: Diagnosis of  By: Amil Amen MD, Benjamine Mola    . Diabetes type 2, uncontrolled ([place]) 05/23/2014  . Diabetic gastroparesis associated with type 1 diabetes mellitus ([place]) 11/27/2016  . DIABETIC PERIPHERAL NEUROPATHY 09/21/2009   Qualifier: Diagnosis of  By: Amil Amen MD, Benjamine Mola    . Diabetic peripheral neuropathy ([place]) 01/23/2016  . Dysuria 12/25/2009   Qualifier: Diagnosis of  By: Amil Amen MD, Benjamine Mola    . Elevation of level of transaminase or lactic acid dehydrogenase (LDH) 09/21/2009   Formatting of this note might be different from the original. Overview:  Qualifier: Diagnosis of  By: Amil Amen MD, Benjamine Mola  . Essential hypertension 08/30/2009   Qualifier: Diagnosis of  By: Lovette Cliche,  CNA, [place]    . Gastric atony 07/30/2009   Formatting of this note might be different from the original. Overview:  Qualifier: Diagnosis of  By: Amil Amen MD, Benjamine Mola  . Gastroesophageal reflux disease 07/30/2009   Formatting of this note might be different from the original. Overview:  Overview:  Qualifier: Diagnosis of  By: Amil Amen MD, Elby Showers: Diagnosis of  By: Chester Holstein NP, Ellwood Handler of this note might be different from the original. Overview:  Qualifier: Diagnosis of  By: Amil Amen MD, Elby Showers: Diagnosis of  By: Chester Holstein NP, Nevin Bloodgood  . Gastroparesis 07/30/2009   Qualifier: Diagnosis of  By: Amil Amen MD, Benjamine Mola    . GERD 07/30/2009   Qualifier: Diagnosis of  By: Amil Amen MD, Elby Showers: Diagnosis of  By: Chester Holstein NP, Nevin Bloodgood    .  GERD (gastroesophageal reflux disease)   . History of cervical cancer 08/17/2015   Status post partial hysterectomy  Formatting of this note might be different from the original. Status post partial hysterectomy  . HLD (hyperlipidemia) 07/30/2009   Qualifier: Diagnosis of  By: Amil Amen MD, Benjamine Mola    . Hyperlipidemia   . Hyperlipidemia, unspecified 07/30/2009   Formatting of this note might be different from the original. Overview:  Qualifier: Diagnosis of  By: Amil Amen MD, Winona Legato of this note might be different from the original. Overview:  Qualifier: Diagnosis of  By: Amil Amen MD, Benjamine Mola  . Hypertension   . INSOMNIA 09/21/2009   Qualifier: Diagnosis of  By: Amil Amen MD, Benjamine Mola    . Lumbar facet arthropathy 05/14/2016  . Lumbar spinal stenosis 02/11/2018  . Metatarsalgia of both feet 03/11/2017  . Migraine 09/13/2012   Overview:  IMPRESSION: possible abd migraine  . Mixed dyslipidemia 02/01/2019  . Nausea & vomiting 07/07/2017  . Nausea with vomiting, unspecified 09/14/2009   Qualifier: Diagnosis of  By: Shane Crutch, [name]   Formatting of this note might be different from the original. Overview:  Qualifier: Diagnosis of  By: Shane Crutch, [name]  . Neuropathy   . Nonspecific abnormal findings on radiological and examination of skull and head 09/13/2012  . Overweight (BMI 25.0-29.9) 07/21/2014  . POSITIVE PPD 07/30/2009   Annotation: CXR clear ?  diagnosed in 2/11?  On INH/pyridoxine Qualifier: Diagnosis of  By: Amil Amen MD, Benjamine Mola    . Pure hypercholesterolemia 11/27/2016  . Retention of urine 06/25/2011  . Right flank pain   . TOBACCO ABUSE 09/21/2009   Qualifier: Diagnosis of  By: Amil Amen MD, Winona Legato of this note might be different from the original. Overview:  Qualifier: Diagnosis of  By: Amil Amen MD, Benjamine Mola  . TRANSAMINASES, SERUM, ELEVATED 09/21/2009   Qualifier: Diagnosis of  By: Amil Amen MD, Benjamine Mola    . Trigeminal neuralgia   . Tuberculin test  reaction 07/30/2009   Formatting of this note might be different from the original. Overview:  Annotation: CXR clear ?  diagnosed in 2/11?  On INH/pyridoxine Qualifier: Diagnosis of  By: Amil Amen MD, Benjamine Mola  . Type 2 diabetes mellitus with hyperglycemia ([place]) 07/30/2009   Qualifier: Diagnosis of  By: Amil Amen MD, Winona Legato of this note might be different from the original. Overview:  Qualifier: Diagnosis of  By: Amil Amen MD, Benjamine Mola  . Type 2 diabetes mellitus with hyperglycemia, with long-term current use of insulin ([place]) 07/30/2009   Formatting of this note might be different from the original. Overview:  05/30/2015 A1C was 16.7% (!)  Overview:  05/30/2015  A1C was 16.7% [place] of this note might be different from the original. 05/30/2015 A1C was 16.7% (!)  . Type 2 diabetes mellitus with stage 3 chronic kidney disease ([place]) 09/14/2009   Qualifier: Diagnosis of  By: Trellis Paganini PA-c, [name]   . ULCER-GASTRIC 09/28/2009   Qualifier: Diagnosis of  By: Chester Holstein NP, Nevin Bloodgood    . URINARY TRACT INFECTION 09/21/2009   Qualifier: Diagnosis of  By: Amil Amen MD, Benjamine Mola    . VAGINITIS, CANDIDAL 12/25/2009   Qualifier: Diagnosis of  By: Amil Amen MD, Benjamine Mola    . Vitamin D deficiency 04/25/2013    Past Surgical History:  Procedure Laterality Date  . ABDOMINAL HYSTERECTOMY     partial  . CHOLECYSTECTOMY      Social History   Tobacco Use  . Smoking status: Never Smoker  . Smokeless tobacco: Never Used  Vaping Use  . Vaping Use: Never used  Substance Use Topics  . Alcohol use: Yes    Comment: occ  . Drug use: No    Family History  Problem Relation Age of Onset  . Diabetes Mother   . Diabetes Father     Allergies  Allergen Reactions  . Insulin Aspart Other (See Comments) and Swelling    adverse reaction to Novolog   . Latex Itching and Rash  . Morphine Itching and Rash    Medication list has been reviewed and updated.  Current Outpatient Medications on File Prior  to Visit  Medication Sig Dispense Refill  . albuterol (VENTOLIN HFA) 108 (90 Base) MCG/ACT inhaler INHALE 2 PUFFS BY MOUTH EVERY 6 HOURS AS NEEDED 18 g 0  . Albuterol Sulfate (PROAIR RESPICLICK) 027 (90 Base) MCG/ACT AEPB Inhale 2 puffs into the lungs at bedtime as needed. 90 each 2  . amLODipine (NORVASC) 5 MG tablet Take 1 tablet by mouth once daily 30 tablet 0  . aspirin 81 MG tablet Take 81 mg by mouth daily.    Marland Kitchen atorvastatin (LIPITOR) 80 MG tablet Take 1 tablet (80 mg total) by mouth daily. 30 tablet 3  . azelastine (ASTELIN) 0.1 % nasal spray Place 2 sprays into both nostrils 2 (two) times daily. 30 mL 1  . busPIRone (BUSPAR) 15 MG tablet TAKE 1 TABLET BY MOUTH THREE TIMES DAILY 21 tablet 0  . carvedilol (COREG) 25 MG tablet TAKE 1 TABLET BY MOUTH TWICE DAILY WITH MEALS 180 tablet 0  . Cholecalciferol (VITAMIN D3) 400 units CAPS Take 400 mg by mouth daily.    Marland Kitchen FARXIGA 10 MG TABS tablet Take 1 tablet by mouth once daily 90 tablet 0  . fluticasone (FLONASE) 50 MCG/ACT nasal spray Place 2 sprays into both nostrils daily. 16 g 1  . fluticasone furoate-vilanterol (BREO ELLIPTA) 100-25 MCG/INH AEPB Inhale 1 puff into the lungs daily. 100 each 2  . gabapentin (NEURONTIN) 300 MG capsule Take 2 capsules by mouth 3 (three) times daily.    . insulin degludec (TRESIBA) 100 UNIT/ML FlexTouch Pen Inject 40 Units into the skin daily at 10 pm.    . meloxicam (MOBIC) 7.5 MG tablet TAKE 1 TO 2 TABLETS BY MOUTH ONCE DAILY 30 tablet 0  . metFORMIN (GLUCOPHAGE-XR) 500 MG 24 hr tablet Take by mouth.    . methocarbamol (ROBAXIN) 750 MG tablet TAKE 1 TABLET BY MOUTH EVERY 8 HOURS AS NEEDED 15 tablet 0  . oxyCODONE (OXY IR/ROXICODONE) 5 MG immediate release tablet 1 tab po twice daily as needed for severe pain 60 tablet 0  . promethazine (  PHENERGAN) 12.5 MG tablet TAKE 1 TABLET BY MOUTH EVERY 8 HOURS AS NEEDED FOR NAUSEA FOR VOMITING 20 tablet 0  . Rimegepant Sulfate (NURTEC) 75 MG TBDP Take 75 mg by mouth daily  as needed. 8 tablet 6  . spironolactone (ALDACTONE) 25 MG tablet Take 1 tablet by mouth once daily 30 tablet 0  . topiramate (TOPAMAX) 50 MG tablet Take 1 tablet (50 mg total) by mouth 2 (two) times daily. 60 tablet 12  . nitroGLYCERIN (NITROSTAT) 0.4 MG SL tablet Place 1 tablet (0.4 mg total) under the tongue every 5 (five) minutes as needed. (Patient not taking: Reported on 01/10/2020) 25 tablet 3   No current facility-administered medications on file prior to visit.    Review of Systems:  As per HPI- otherwise negative. Wt Readings from Last 3 Encounters:  04/19/20 160 lb (72.6 kg)  04/17/20 160 lb 3.2 oz (72.7 kg)  04/16/20 160 lb 4.4 oz (72.7 kg)   BP Readings from Last 3 Encounters:  04/19/20 (!) 142/98  04/17/20 112/76  04/16/20 (!) 144/93     Physical Examination: Vitals:   04/19/20 1059  BP: (!) 142/98  Pulse: 98  Resp: 17  Temp: 97.8 F (36.6 C)  SpO2: 99%   Vitals:   04/19/20 1059  Weight: 160 lb (72.6 kg)  Height: 6' (1.829 m)   Body mass index is 21.7 kg/m. Ideal Body Weight: Weight in (lb) to have BMI = 25: 183.9  GEN: no acute distress.  Tall build, normal weight, looks well  HEENT: Atraumatic, Normocephalic.  Ears and Nose: No external deformity. CV: RRR, No M/G/R. No JVD. No thrill. No extra heart sounds. PULM: CTA B, no wheezes, crackles, rhonchi. No retractions. No resp. distress. No accessory muscle use. ABD: S, NT, ND, +BS. No rebound. No HSM.  Belly is benign EXTR: No c/c/e PSYCH: Normally interactive. Conversant.  Hemoccult negative Her back is no longer painful.  No CVA tenderness  Results for orders placed or performed in visit on 04/19/20  Urine Culture   Specimen: Blood  Result Value Ref Range   MICRO NUMBER: [id number]    SPECIMEN QUALITY: Adequate    Sample Source NOT GIVEN    STATUS: FINAL    Result: No Growth   CBC  Result Value Ref Range   WBC 5.3 4.0 - 10.5 K/uL   RBC 4.35 3.87 - 5.11 Mil/uL   Platelets 255.0 150.0 -  400.0 K/uL   Hemoglobin 13.2 12.0 - 15.0 g/dL   HCT 39.6 36.0 - 46.0 %   MCV 91.1 78.0 - 100.0 fl   MCHC 33.2 30.0 - 36.0 g/dL   RDW 15.7 (H) 11.5 - 15.5 %  Comprehensive metabolic panel  Result Value Ref Range   Sodium 140 135 - 145 mEq/L   Potassium 4.2 3.5 - 5.1 mEq/L   Chloride 105 96 - 112 mEq/L   CO2 27 19 - 32 mEq/L   Glucose, Bld 157 (H) 70 - 99 mg/dL   BUN 20 6 - 23 mg/dL   Creatinine, Ser 0.99 0.40 - 1.20 mg/dL   Total Bilirubin 0.5 0.2 - 1.2 mg/dL   Alkaline Phosphatase 93 39 - 117 U/L   AST 16 0 - 37 U/L   ALT 14 0 - 35 U/L   Total Protein 7.4 6.0 - 8.3 g/dL   Albumin 4.3 3.5 - 5.2 g/dL   GFR 64.28 >60.00 mL/min   Calcium 9.3 8.4 - 10.5 mg/dL  POCT urinalysis dipstick  Result Value  Ref Range   Color, UA yellow yellow   Clarity, UA clear clear   Glucose, UA >=1,000 (A) negative mg/dL   Bilirubin, UA negative negative   Ketones, POC UA negative negative mg/dL   Spec Grav, UA 1.020 1.010 - 1.025   Blood, UA negative negative   pH, UA 6.0 5.0 - 8.0   Protein Ur, POC trace (A) negative mg/dL   Urobilinogen, UA 0.2 0.2 or 1.0 E.U./dL   Nitrite, UA Negative Negative   Leukocytes, UA Negative Negative  IFOBT POC (occult bld, rslt in office)  Result Value Ref Range   IFOBT Negative     Assessment and Plan: Dark urine - Plan: POCT urinalysis dipstick, Urine Culture  Flank pain - Plan: POCT urinalysis dipstick, Urine Culture, CBC, Comprehensive metabolic panel  Blood in stool - Plan: Comprehensive metabolic panel, Ambulatory referral to Gastroenterology, IFOBT POC (occult bld, rslt in office), IFOBT POC (occult bld, rslt in office)  Patient seen today with a couple of concerns.  She recently was seen in the ER with chest pain.  This is no longer acutely bothersome, cardiology follow-up as planned.  Yesterday she noted blood in her stool, dark-colored urine, vomiting and left-sided back pain.  All of these seem to have resolved at this time.  Hemoccult today is  negative UA is as expected, patient is taking [place] which explains glucose in urine Suspect she may have had an episode of colitis Needs referral to GI anyway as she is overdue for routine screening Labs pending as above Discussed strict return to care precautions with patient.  I will be in touch with her pending her results, she will let us know if any concerns in the meantime  This visit occurred during the SARS-CoV-2 public health emergency.  Safety protocols were in place, including screening questions prior to the visit, additional usage of staff PPE, and extensive cleaning of exam room while observing appropriate contact time as indicated for disinfecting solutions.     Signed Lamar Blinks, MD  Received labs as below, message to patient Results for orders placed or performed in visit on 04/19/20  Urine Culture   Specimen: Blood  Result Value Ref Range   MICRO NUMBER: [id number]    SPECIMEN QUALITY: Adequate    Sample Source NOT GIVEN    STATUS: FINAL    Result: No Growth   CBC  Result Value Ref Range   WBC 5.3 4.0 - 10.5 K/uL   RBC 4.35 3.87 - 5.11 Mil/uL   Platelets 255.0 150.0 - 400.0 K/uL   Hemoglobin 13.2 12.0 - 15.0 g/dL   HCT 39.6 36.0 - 46.0 %   MCV 91.1 78.0 - 100.0 fl   MCHC 33.2 30.0 - 36.0 g/dL   RDW 15.7 (H) 11.5 - 15.5 %  Comprehensive metabolic panel  Result Value Ref Range   Sodium 140 135 - 145 mEq/L   Potassium 4.2 3.5 - 5.1 mEq/L   Chloride 105 96 - 112 mEq/L   CO2 27 19 - 32 mEq/L   Glucose, Bld 157 (H) 70 - 99 mg/dL   BUN 20 6 - 23 mg/dL   Creatinine, Ser 0.99 0.40 - 1.20 mg/dL   Total Bilirubin 0.5 0.2 - 1.2 mg/dL   Alkaline Phosphatase 93 39 - 117 U/L   AST 16 0 - 37 U/L   ALT 14 0 - 35 U/L   Total Protein 7.4 6.0 - 8.3 g/dL   Albumin 4.3 3.5 - 5.2 g/dL   GFR  64.28 >60.00 mL/min   Calcium 9.3 8.4 - 10.5 mg/dL  POCT urinalysis dipstick  Result Value Ref Range   Color, UA yellow yellow   Clarity, UA clear clear   Glucose, UA >=1,000  (A) negative mg/dL   Bilirubin, UA negative negative   Ketones, POC UA negative negative mg/dL   Spec Grav, UA 1.020 1.010 - 1.025   Blood, UA negative negative   pH, UA 6.0 5.0 - 8.0   Protein Ur, POC trace (A) negative mg/dL   Urobilinogen, UA 0.2 0.2 or 1.0 E.U./dL   Nitrite, UA Negative Negative   Leukocytes, UA Negative Negative  IFOBT POC (occult bld, rslt in office)  Result Value Ref Range   IFOBT Negative    Received urine culture 3/12- message to pt

## 2020-04-19 NOTE — Telephone Encounter (Signed)
Patient came into office this AM and was scheduled with Dr.Copland

## 2020-04-19 NOTE — Patient Instructions (Addendum)
It was good to see you today- I will be in touch with your labs asap!   If you have any significant rectal bleeding please let me know- if any severe bleeding seek immediate care! We will get you set up for a colonoscopy to look for a source of your bleeding and to screen for colon cancer We have sent your urine out for a culture also   We will watch for your FMLA paperwork

## 2020-04-20 LAB — URINE CULTURE
MICRO NUMBER:: 11632020
Result:: NO GROWTH
SPECIMEN QUALITY:: ADEQUATE

## 2020-04-21 ENCOUNTER — Encounter: Payer: Self-pay | Admitting: Family Medicine

## 2020-04-23 ENCOUNTER — Telehealth: Payer: Self-pay | Admitting: Medical

## 2020-04-23 NOTE — Telephone Encounter (Signed)
Documents placed in edward's folder

## 2020-04-23 NOTE — Telephone Encounter (Signed)
Document fax to our office to be filled out by provider ([place] 5 pages) Document put at front office tray under providers name.

## 2020-04-25 ENCOUNTER — Telehealth: Payer: Self-pay | Admitting: Medical

## 2020-04-25 MED ORDER — OXYCODONE HCL 5 MG PO TABS: ORAL_TABLET | ORAL | 0 refills | Status: DC

## 2020-04-25 NOTE — Telephone Encounter (Signed)
Patient # [phone]   Patient called to follow up on FMLA paper she would like for form to include heart issue and headaches Patient would like a phone call so she can pick up the document when ready number

## 2020-04-25 NOTE — Telephone Encounter (Signed)
Medication:oxyCODONE (OXY IR/ROXICODONE) 5 MG immediate release tablet   Has the patient contacted their pharmacy? No. (If no, request that the patient contact the pharmacy for the refill.) (If yes, when and what did the pharmacy advise?)  Preferred Pharmacy (with phone number or street name):  [place], [place] [address]  Phone:  [phone] Fax:  [phone]  Agent: Please be advised that RX refills may take up to 3 business days. We ask that you follow-up with your pharmacy.

## 2020-04-25 NOTE — Telephone Encounter (Signed)
Refilled her oxycocdone. She also needs controlled med visit. If you can get her scheduled. For fmla paperwork will do controlled med visit same day.

## 2020-04-25 NOTE — Telephone Encounter (Signed)
Requesting: oxycodone Contract:09/2019 UDS:09/2019 Last Visit:04/2020 Next Visit:n/a Last Refill:11/2019  Please Advise

## 2020-04-25 NOTE — Telephone Encounter (Signed)
I did review patient's FMLA form.  She has had numerous issues from blood in urine, headache and chest pain.  Typically do need to see patients and fill out the forms.  Would prefer that she have 40-minute appointment as she usually brings up various issues.  Need to discuss her goals with FMLA.  Need to consult with her in office to fill out forms.  Please schedule her for Friday or Monday.

## 2020-04-26 NOTE — Telephone Encounter (Signed)
Pt called and appointment made

## 2020-04-26 NOTE — Telephone Encounter (Signed)
Please refer to other phone note.  °

## 2020-04-27 ENCOUNTER — Ambulatory Visit (INDEPENDENT_AMBULATORY_CARE_PROVIDER_SITE_OTHER): Payer: 59 | Admitting: Medical

## 2020-04-27 ENCOUNTER — Other Ambulatory Visit: Payer: Self-pay

## 2020-04-27 VITALS — BP 110/76 | HR 78 | Resp 18 | Ht 72.0 in | Wt 160.0 lb

## 2020-04-27 DIAGNOSIS — R079 Chest pain, unspecified: Secondary | ICD-10-CM

## 2020-04-27 DIAGNOSIS — F439 Reaction to severe stress, unspecified: Secondary | ICD-10-CM | POA: Diagnosis not present

## 2020-04-27 DIAGNOSIS — F419 Anxiety disorder, unspecified: Secondary | ICD-10-CM | POA: Diagnosis not present

## 2020-04-27 DIAGNOSIS — G43809 Other migraine, not intractable, without status migrainosus: Secondary | ICD-10-CM

## 2020-04-27 MED ORDER — SERTRALINE HCL 25 MG PO TABS: 25.0000 mg | ORAL_TABLET | Freq: Every day | ORAL | 0 refills | Status: DC

## 2020-04-27 NOTE — Progress Notes (Signed)
Subjective:    Patient ID: [name], female    DOB: [birth year], 55 y.o.   MRN: [id number]  HPI  Pt in for follow up on conditions and to fill out fmla.    Pt first missed work on 04-13-2020.  She had facial pain with ha. Evaluated in ED(not admitted). Then eventually saw neurologist. Dx:  1. Migraine with aura and without status migrainosus, not intractable       PLAN:  NEW ONSET PAIN / HEADACHE / ANXIETY - check MRI brain    Mri is scheduled for 04/28/2020.  Pt has head daily. Pt was given tpiramate 50 mg to prevent ha as well as Nurtec ODT. But still daily ha.     Pt saw me on 04/16/2020 and had chest pain. ED evaluated her.  HEAR Score: 4                       ED summary below in " "55 year old female presenting to the ED with complaint of left sided chest pain radiating into R chest with intermittent tingling to R hand and left middle finger that began around 1 PM. Sent by her PCP to rule out MI as she had some nonspecific ST changes. On arrival to the ED pt is afebrile, nontachycardic, and nontachypneic. She appears mildly anxious at this time however in NAD. She does have chest wall TTP. She had equal pulses bilaterally with a stable BP 147/98. She does have + Fhx of CAD < 50 years ago however she has also has sinus issues for the past 2 weeks and was around a brother during a long car ride to [place] who may or may not have COVID. EKG obtained in the ED - no acute ischemic changes. Does appear unchanged from previous; we were able to pull up EKG obtained by PCP's office and looks similar to previous tracing as well without obvious nonspecific ST changes. CXR clear. CBC without leukocytosis and Hgb stable at 13.6. BMP with creatinine 1.12 which does appear slightly elevated compared to baseline. Troponin of 4. Will plan to repeat troponin and add a Dimer at this time with pts recent long travel as well as having sinus pressure for 2 weeks; question if she  had COVID 2 weeks ago and now presenting signs concerning for PE however pt is nontachycardic and denies SOB. If dimer negative do not feel pt requires CTA. Question anxiety vs stress as well given pt is complaining of atypical tingling in her right hand and middle finger on left hand; she does report she has been stressed recently."  No current chest pain today but the other day transient and slight. She has appointment with cadriologist on May 19, 2020. Possibly sooner if cancellation.   Pt does express that work and death in family causing her stress/anxiety. She worries about her health above recent symptoms as well. Pt states her anxiety is high and having some depression as well. Pt was using buspar and she states tid not help.Pt states has never been on sertaline.  She also has chronic back pain in past. She uses oxycodone.    Review of Systems  Constitutional: Negative for chills, fatigue and fever.  Respiratory: Negative for cough, chest tightness, shortness of breath and wheezing.   Cardiovascular: Negative for chest pain and palpitations.  Gastrointestinal: Negative for abdominal distention, anal bleeding, nausea and vomiting.  Musculoskeletal: Negative for back pain and myalgias.  Skin: Negative for  rash.  Neurological: Negative for dizziness, seizures, weakness and headaches.  Hematological: Negative for adenopathy. Does not bruise/bleed easily.  Psychiatric/Behavioral: Positive for dysphoric mood. Negative for suicidal ideas. The patient is nervous/anxious.     Past Medical History:  Diagnosis Date  . ABDOMINAL PAIN-EPIGASTRIC 09/14/2009   Qualifier: Diagnosis of  By: Trellis Paganini PA-c, [name]   . Acute cystitis 07/07/2017  . ANKLE PAIN, LEFT 12/25/2009   Qualifier: Diagnosis of  By: Amil Amen MD, Benjamine Mola    . Anxiety 01/22/2016  . CAD (coronary artery disease) 07/07/2017  . Cardiac murmur 02/01/2019  . Cardiomyopathy, secondary ([place]) 09/06/2009   Qualifier: Diagnosis of  By:  Arvid Right   Formatting of this note might be different from the original. Overview:  Qualifier: Diagnosis of  By: Arvid Right  . Cardiomyopathy, unspecified ([place]) 09/06/2009   Formatting of this note might be different from the original. Overview:  Overview:  Qualifier: Diagnosis of  By: Arvid Right  Overview:  Overview:  Qualifier: Diagnosis of  By: Arvid Right Formatting of this note might be different from the original. Overview:  Qualifier: Diagnosis of  By: Arvid Right  . Cerebrovascular disease 09/13/2012  . Cervical cancer ([place])    had surgery in 2001.  . CHEST PAIN, ATYPICAL 07/30/2009   Qualifier: Diagnosis of  By: Amil Amen MD, Winona Legato of this note might be different from the original. Overview:  Qualifier: Diagnosis of  By: Amil Amen MD, Benjamine Mola  . Chronic back pain   . Chronic kidney disease, stage III (moderate) ([place]) 05/23/2014  . Chronic pain syndrome 01/23/2016  . Coronary artery disease    30% lesions noted  . Current use of insulin ([place]) 05/23/2014  . Depression   . Diabetes mellitus   . DIABETES MELLITUS, TYPE II, UNCONTROLLED 07/30/2009   Qualifier: Diagnosis of  By: Amil Amen MD, Benjamine Mola    . Diabetes type 2, uncontrolled ([place]) 05/23/2014  . Diabetic gastroparesis associated with type 1 diabetes mellitus ([place]) 11/27/2016  . DIABETIC PERIPHERAL NEUROPATHY 09/21/2009   Qualifier: Diagnosis of  By: Amil Amen MD, Benjamine Mola    . Diabetic peripheral neuropathy ([place]) 01/23/2016  . Dysuria 12/25/2009   Qualifier: Diagnosis of  By: Amil Amen MD, Benjamine Mola    . Elevation of level of transaminase or lactic acid dehydrogenase (LDH) 09/21/2009   Formatting of this note might be different from the original. Overview:  Qualifier: Diagnosis of  By: Amil Amen MD, Benjamine Mola  . Essential hypertension 08/30/2009   Qualifier: Diagnosis of  By: Lovette Cliche, CNA, Christy    . Gastric atony 07/30/2009    Formatting of this note might be different from the original. Overview:  Qualifier: Diagnosis of  By: Amil Amen MD, Benjamine Mola  . Gastroesophageal reflux disease 07/30/2009   Formatting of this note might be different from the original. Overview:  Overview:  Qualifier: Diagnosis of  By: Amil Amen MD, Elby Showers: Diagnosis of  By: Chester Holstein NP, Ellwood Handler of this note might be different from the original. Overview:  Qualifier: Diagnosis of  By: Amil Amen MD, Elby Showers: Diagnosis of  By: Chester Holstein NP, Nevin Bloodgood  . Gastroparesis 07/30/2009   Qualifier: Diagnosis of  By: Amil Amen MD, Benjamine Mola    . GERD 07/30/2009   Qualifier: Diagnosis of  By: Amil Amen MD, Elby Showers: Diagnosis of  By: Chester Holstein NP, Nevin Bloodgood    . GERD (gastroesophageal reflux disease)   . History of cervical cancer 08/17/2015  Status post partial hysterectomy  Formatting of this note might be different from the original. Status post partial hysterectomy  . HLD (hyperlipidemia) 07/30/2009   Qualifier: Diagnosis of  By: Amil Amen MD, Benjamine Mola    . Hyperlipidemia   . Hyperlipidemia, unspecified 07/30/2009   Formatting of this note might be different from the original. Overview:  Qualifier: Diagnosis of  By: Amil Amen MD, Winona Legato of this note might be different from the original. Overview:  Qualifier: Diagnosis of  By: Amil Amen MD, Benjamine Mola  . Hypertension   . INSOMNIA 09/21/2009   Qualifier: Diagnosis of  By: Amil Amen MD, Benjamine Mola    . Lumbar facet arthropathy 05/14/2016  . Lumbar spinal stenosis 02/11/2018  . Metatarsalgia of both feet 03/11/2017  . Migraine 09/13/2012   Overview:  IMPRESSION: possible abd migraine  . Mixed dyslipidemia 02/01/2019  . Nausea & vomiting 07/07/2017  . Nausea with vomiting, unspecified 09/14/2009   Qualifier: Diagnosis of  By: Shane Crutch, [name]   Formatting of this note might be different from the original. Overview:  Qualifier: Diagnosis of  By: Shane Crutch, [name]  .  Neuropathy   . Nonspecific abnormal findings on radiological and examination of skull and head 09/13/2012  . Overweight (BMI 25.0-29.9) 07/21/2014  . POSITIVE PPD 07/30/2009   Annotation: CXR clear ?  diagnosed in 2/11?  On INH/pyridoxine Qualifier: Diagnosis of  By: Amil Amen MD, Benjamine Mola    . Pure hypercholesterolemia 11/27/2016  . Retention of urine 06/25/2011  . Right flank pain   . TOBACCO ABUSE 09/21/2009   Qualifier: Diagnosis of  By: Amil Amen MD, Winona Legato of this note might be different from the original. Overview:  Qualifier: Diagnosis of  By: Amil Amen MD, Benjamine Mola  . TRANSAMINASES, SERUM, ELEVATED 09/21/2009   Qualifier: Diagnosis of  By: Amil Amen MD, Benjamine Mola    . Trigeminal neuralgia   . Tuberculin test reaction 07/30/2009   Formatting of this note might be different from the original. Overview:  Annotation: CXR clear ?  diagnosed in 2/11?  On INH/pyridoxine Qualifier: Diagnosis of  By: Amil Amen MD, Benjamine Mola  . Type 2 diabetes mellitus with hyperglycemia ([place]) 07/30/2009   Qualifier: Diagnosis of  By: Amil Amen MD, Winona Legato of this note might be different from the original. Overview:  Qualifier: Diagnosis of  By: Amil Amen MD, Benjamine Mola  . Type 2 diabetes mellitus with hyperglycemia, with long-term current use of insulin ([place]) 07/30/2009   Formatting of this note might be different from the original. Overview:  05/30/2015 A1C was 16.7% (!)  Overview:  05/30/2015 A1C was 16.7% (!) Formatting of this note might be different from the original. 05/30/2015 A1C was 16.7% (!)  . Type 2 diabetes mellitus with stage 3 chronic kidney disease ([place]) 09/14/2009   Qualifier: Diagnosis of  By: Trellis Paganini PA-c, [name]   . ULCER-GASTRIC 09/28/2009   Qualifier: Diagnosis of  By: Chester Holstein NP, Nevin Bloodgood    . URINARY TRACT INFECTION 09/21/2009   Qualifier: Diagnosis of  By: Amil Amen MD, Benjamine Mola    . VAGINITIS, CANDIDAL 12/25/2009   Qualifier: Diagnosis of  By: Amil Amen MD, Benjamine Mola    .  Vitamin D deficiency 04/25/2013     Social History   Socioeconomic History  . Marital status: Single    Spouse name: Not on file  . Number of children: Not on file  . Years of education: Not on file  . Highest education level: Not on file  Occupational History  . Not on file  Tobacco  Use  . Smoking status: Never Smoker  . Smokeless tobacco: Never Used  Vaping Use  . Vaping Use: Never used  Substance and Sexual Activity  . Alcohol use: Yes    Comment: occ  . Drug use: No  . Sexual activity: Not on file  Other Topics Concern  . Not on file  Social History Narrative  . Not on file   Social Determinants of Health   Financial Resource Strain: Not on file  Food Insecurity: Not on file  Transportation Needs: Not on file  Physical Activity: Not on file  Stress: Not on file  Social Connections: Not on file  Intimate Partner Violence: Not on file    Past Surgical History:  Procedure Laterality Date  . ABDOMINAL HYSTERECTOMY     partial  . CHOLECYSTECTOMY      Family History  Problem Relation Age of Onset  . Diabetes Mother   . Diabetes Father     Allergies  Allergen Reactions  . Insulin Aspart Other (See Comments) and Swelling    adverse reaction to Novolog   . Latex Itching and Rash  . Morphine Itching and Rash    Current Outpatient Medications on File Prior to Visit  Medication Sig Dispense Refill  . albuterol (VENTOLIN HFA) 108 (90 Base) MCG/ACT inhaler INHALE 2 PUFFS BY MOUTH EVERY 6 HOURS AS NEEDED 18 g 0  . Albuterol Sulfate (PROAIR RESPICLICK) 809 (90 Base) MCG/ACT AEPB Inhale 2 puffs into the lungs at bedtime as needed. 90 each 2  . amLODipine (NORVASC) 5 MG tablet Take 1 tablet by mouth once daily 30 tablet 0  . aspirin 81 MG tablet Take 81 mg by mouth daily.    Marland Kitchen atorvastatin (LIPITOR) 80 MG tablet Take 1 tablet (80 mg total) by mouth daily. 30 tablet 3  . azelastine (ASTELIN) 0.1 % nasal spray Place 2 sprays into both nostrils 2 (two) times daily.  30 mL 1  . busPIRone (BUSPAR) 15 MG tablet TAKE 1 TABLET BY MOUTH THREE TIMES DAILY 21 tablet 0  . carvedilol (COREG) 25 MG tablet TAKE 1 TABLET BY MOUTH TWICE DAILY WITH MEALS 180 tablet 0  . Cholecalciferol (VITAMIN D3) 400 units CAPS Take 400 mg by mouth daily.    Marland Kitchen FARXIGA 10 MG TABS tablet Take 1 tablet by mouth once daily 90 tablet 0  . fluticasone (FLONASE) 50 MCG/ACT nasal spray Place 2 sprays into both nostrils daily. 16 g 1  . fluticasone furoate-vilanterol (BREO ELLIPTA) 100-25 MCG/INH AEPB Inhale 1 puff into the lungs daily. 100 each 2  . gabapentin (NEURONTIN) 300 MG capsule Take 2 capsules by mouth 3 (three) times daily.    . insulin degludec (TRESIBA) 100 UNIT/ML FlexTouch Pen Inject 40 Units into the skin daily at 10 pm.    . meloxicam (MOBIC) 7.5 MG tablet TAKE 1 TO 2 TABLETS BY MOUTH ONCE DAILY 30 tablet 0  . metFORMIN (GLUCOPHAGE-XR) 500 MG 24 hr tablet Take by mouth.    . methocarbamol (ROBAXIN) 750 MG tablet TAKE 1 TABLET BY MOUTH EVERY 8 HOURS AS NEEDED 15 tablet 0  . nitroGLYCERIN (NITROSTAT) 0.4 MG SL tablet Place 1 tablet (0.4 mg total) under the tongue every 5 (five) minutes as needed. (Patient not taking: Reported on 01/10/2020) 25 tablet 3  . oxyCODONE (OXY IR/ROXICODONE) 5 MG immediate release tablet 1 tab po twice daily as needed for severe pain 60 tablet 0  . promethazine (PHENERGAN) 12.5 MG tablet TAKE 1 TABLET BY MOUTH EVERY  8 HOURS AS NEEDED FOR NAUSEA FOR VOMITING 20 tablet 0  . Rimegepant Sulfate (NURTEC) 75 MG TBDP Take 75 mg by mouth daily as needed. 8 tablet 6  . spironolactone (ALDACTONE) 25 MG tablet Take 1 tablet by mouth once daily 30 tablet 0  . topiramate (TOPAMAX) 50 MG tablet Take 1 tablet (50 mg total) by mouth 2 (two) times daily. 60 tablet 12   No current facility-administered medications on file prior to visit.    BP 110/76   Pulse 78   Resp 18   Ht 6' (1.829 m)   Wt 160 lb (72.6 kg)   SpO2 98%   BMI 21.70 kg/m       Objective:    Physical Exam  General Mental Status- Alert. General Appearance- Not in acute distress.   Skin General: Color- Normal Color. Moisture- Normal Moisture.  Neck Carotid Arteries- Normal color. Moisture- Normal Moisture. No carotid bruits. No JVD.  Chest and Lung Exam Auscultation: Breath Sounds:-Normal.  Cardiovascular Auscultation:Rythm- Regular. Murmurs & Other Heart Sounds:Auscultation of the heart reveals- No Murmurs.  Abdomen Inspection:-Inspeection Normal. Palpation/Percussion:Note:No mass. Palpation and Percussion of the abdomen reveal- Non Tender, Non Distended + BS, no rebound or guarding.   Neurologic Cranial Nerve exam:- CN III-XII intact(No nystagmus), symmetric smile. Strength:- 5/5 equal and symmetric strength both upper and lower extremities.      Assessment & Plan:  Filled out Fmla form today. Will ask staff to fax it on Monday morning.  For stress/anxiety high level with some depressed mood rx sertraline 25 mg daily and can continue buspar.  For migraine HA continue with regimen neurologist has you on. Follow thru with mri and keep follow up appt.  For intermittent chest pain follow up with cardiologist. Call them to see if you can get earlier appt. If any severe/significant chest pain or associated signs/symptoms then be seen in ED during interim.  I think weight loss is related to stress and not eating. Keep Gi appointment for repeat colonoscopy and keep pap upcoming. Mammogram up to date.  For back pain chronic reviewed up to date on contract and uds. Will fill oxycodone intermittent when needed.   Follow up in 2 weeks or as needed  Time spent with patient today was 41  minutes which consisted of chart review, discussing diagnosis, work up treatment and documentation.

## 2020-04-27 NOTE — Patient Instructions (Signed)
Filled out Fmla form today. Will ask staff to fax it on Monday morning.  For stress/anxiety high level with some depressed mood rx sertraline 25 mg daily and can continue buspar.  For migraine HA continue with regimen neurologist has you on. Follow thru with mri and keep follow up appt.  For intermittent chest pain follow up with cardiologist. Call them to see if you can get earlier appt. If any severe/significant chest pain or associated signs/symptoms then be seen in ED during interim.  I think weight loss is related to stress and not eating. Keep Gi appointment for repeat colonoscopy and keep pap upcoming. Mammogram up to date.  For back pain chronic reviewed up to date on contract and uds. Will fill oxycodone intermittent when needed.   Follow up in 2 weeks or as needed

## 2020-04-28 ENCOUNTER — Emergency Department (HOSPITAL_BASED_OUTPATIENT_CLINIC_OR_DEPARTMENT_OTHER): Admission: EM | Admit: 2020-04-28 | Discharge: 2020-04-28 | Discharge status: Absent without leave | Payer: 59

## 2020-04-28 ENCOUNTER — Ambulatory Visit (HOSPITAL_BASED_OUTPATIENT_CLINIC_OR_DEPARTMENT_OTHER)
Admission: RE | Admit: 2020-04-28 | Discharge: 2020-04-28 | Disposition: A | Discharge status: Home / Self Care | Payer: 59 | Source: Ambulatory Visit | Attending: Medical | Admitting: Medical

## 2020-04-28 ENCOUNTER — Ambulatory Visit (HOSPITAL_BASED_OUTPATIENT_CLINIC_OR_DEPARTMENT_OTHER)
Admission: RE | Admit: 2020-04-28 | Discharge: 2020-04-28 | Disposition: A | Discharge status: Home / Self Care | Payer: 59 | Source: Ambulatory Visit | Attending: Diagnostic Neuroimaging | Admitting: Diagnostic Neuroimaging

## 2020-04-28 DIAGNOSIS — E010 Iodine-deficiency related diffuse (endemic) goiter: Secondary | ICD-10-CM | POA: Insufficient documentation

## 2020-04-28 DIAGNOSIS — R519 Headache, unspecified: Secondary | ICD-10-CM | POA: Insufficient documentation

## 2020-04-28 MED ORDER — GADOBUTROL 1 MMOL/ML IV SOLN
7.0000 mL | Freq: Once | INTRAVENOUS | Status: AC | PRN
  Administered 2020-04-28: 7 mL via INTRAVENOUS

## 2020-04-29 ENCOUNTER — Other Ambulatory Visit: Payer: Self-pay | Admitting: Medical

## 2020-04-30 ENCOUNTER — Telehealth: Payer: Self-pay | Admitting: Medical

## 2020-04-30 NOTE — Telephone Encounter (Signed)
I can't find that in epic. Where did she go? Can you see the notes?

## 2020-04-30 NOTE — Telephone Encounter (Signed)
I called patient and she stated she was seen , but since there is no AVS I believe she left and she stated no recent falls but if she falls again she will call to schedule an appointment

## 2020-04-30 NOTE — Telephone Encounter (Signed)
It sounds like she left if she went to [place]?

## 2020-04-30 NOTE — Telephone Encounter (Signed)
Difficult to address this as extra brief update. But if she randomly has other fall then need to make visit as need to know context of events. May need to refer to PT to evaluate gait assesment, consider MSK, neurologic cause etc?

## 2020-04-30 NOTE — Telephone Encounter (Signed)
Patient states she forgot to mention to provider at her last visit, that she fell twice. Patient wanted to make provider aware

## 2020-04-30 NOTE — Telephone Encounter (Signed)
Patient went to ER on 04/28/20 , did you review the AVS

## 2020-05-01 NOTE — Telephone Encounter (Signed)
Can't really advise without exam and discussion of events.

## 2020-05-01 NOTE — Telephone Encounter (Signed)
Yes she left

## 2020-05-02 ENCOUNTER — Other Ambulatory Visit: Payer: Self-pay | Admitting: Medical

## 2020-05-03 ENCOUNTER — Telehealth: Payer: Self-pay | Admitting: Medical

## 2020-05-03 ENCOUNTER — Other Ambulatory Visit: Payer: Self-pay | Admitting: Medical

## 2020-05-03 NOTE — Telephone Encounter (Signed)
Caller: [name]  Call Back @ [phone]  Patient  Is calling requesting a refill or medication below due to patient no longer having an Oncologist.  Medication: promethazine (PHENERGAN) 12.5 MG tablet [[id number]  insulin degludec (TRESIBA) 100 UNIT/ML FlexTouch Pen [[id number]]   gabapentin (NEURONTIN) 300 MG capsule [[id number]]  amLODipine (NORVASC) 5 MG tablet [[id number]]    Has the patient contacted their pharmacy? No. (If no, request that the patient contact the pharmacy for the refill.) (If yes, when and what did the pharmacy advise?)  Preferred Pharmacy (with phone number or street name):  [facility]  [address] [place], [place] [address]  Phone:  [phone] Fax:  [phone]  DEA #:  --   Agent: Please be advised that RX refills may take up to 3 business days. We ask that you follow-up with your pharmacy.

## 2020-05-04 MED ORDER — GABAPENTIN 300 MG PO CAPS: 600.0000 mg | ORAL_CAPSULE | Freq: Three times a day (TID) | ORAL | 2 refills | Status: DC

## 2020-05-04 MED ORDER — INSULIN DEGLUDEC 100 UNIT/ML ~~LOC~~ SOPN: 40.0000 [IU] | PEN_INJECTOR | Freq: Every day | SUBCUTANEOUS | 2 refills | Status: DC

## 2020-05-04 MED ORDER — PROMETHAZINE HCL 12.5 MG PO TABS: ORAL_TABLET | ORAL | 0 refills | Status: DC

## 2020-05-04 MED ORDER — AMLODIPINE BESYLATE 5 MG PO TABS: 5.0000 mg | ORAL_TABLET | Freq: Every day | ORAL | 0 refills | Status: DC

## 2020-05-04 NOTE — Telephone Encounter (Signed)
Rx sent 

## 2020-05-05 ENCOUNTER — Other Ambulatory Visit: Payer: Self-pay | Admitting: Medical

## 2020-05-07 ENCOUNTER — Telehealth: Payer: Self-pay | Admitting: Medical

## 2020-05-07 MED ORDER — CARVEDILOL 25 MG PO TABS: 25.0000 mg | ORAL_TABLET | Freq: Two times a day (BID) | ORAL | 0 refills | Status: DC

## 2020-05-07 NOTE — Telephone Encounter (Signed)
Donika from [employer] Caller# [phone]  Claim # [id number]   Caller wanted to ask some question in regards to patient disability claim

## 2020-05-07 NOTE — Telephone Encounter (Signed)
Lipitor denied due to being sent at office visit. Resent in carvedilol

## 2020-05-07 NOTE — Addendum Note (Signed)
Addended by: Wynonia Musty A on: 05/07/2020 10:28 AM   Modules accepted: Orders

## 2020-05-07 NOTE — Telephone Encounter (Signed)
Jarrett Soho this was a refill error-could you check and see if it needed to be sent? It looks like in the note it says refused but the refusal didn't go through. Could you just look behind me?

## 2020-05-08 NOTE — Telephone Encounter (Signed)
Spoke with [employer] .

## 2020-05-08 NOTE — Telephone Encounter (Signed)
Called [name] and lvm to return call .

## 2020-05-09 ENCOUNTER — Telehealth: Payer: Self-pay | Admitting: *Deleted

## 2020-05-09 NOTE — Telephone Encounter (Signed)
Called patient, advised her of Dr Garth Bigness MRI brain result. Patient verbalized understanding, appreciation.

## 2020-05-09 NOTE — Telephone Encounter (Signed)
Please let the patient know that  "the MRI of the brain looks okay.  It does show changes that are likely due to combination of age, hypertension and diabetes.  Make sure to try to keep the hypertension and diabetes under the best control possible."

## 2020-05-16 ENCOUNTER — Telehealth: Payer: Self-pay | Admitting: Medical

## 2020-05-16 MED ORDER — BUSPIRONE HCL 15 MG PO TABS: 15.0000 mg | ORAL_TABLET | Freq: Three times a day (TID) | ORAL | 0 refills | Status: DC

## 2020-05-16 MED ORDER — METHOCARBAMOL 750 MG PO TABS: 750.0000 mg | ORAL_TABLET | Freq: Three times a day (TID) | ORAL | 0 refills | Status: DC | PRN

## 2020-05-16 NOTE — Telephone Encounter (Signed)
Medication:busPIRone (BUSPAR) 15 MG tablet  methocarbamol (ROBAXIN) 750 MG tablet    Has the patient contacted their pharmacy? No. (If no, request that the patient contact the pharmacy for the refill.) (If yes, when and what did the pharmacy advise?)  Preferred Pharmacy (with phone number or street name): [facility], [place] [address]  Phone:  [phone] Fax:  [phone]  Agent: Please be advised that RX refills may take up to 3 business days. We ask that you follow-up with your pharmacy.

## 2020-05-16 NOTE — Telephone Encounter (Signed)
Rx sent 

## 2020-05-21 ENCOUNTER — Encounter: Payer: Self-pay | Admitting: Gastroenterology

## 2020-05-21 ENCOUNTER — Telehealth: Payer: Self-pay | Admitting: Gastroenterology

## 2020-05-21 NOTE — Telephone Encounter (Signed)
Hi Dr. Bryan Lemma, we have received a referral from patient's PCP for a colon. Patient used to see Dr. Olevia Perches, then transferred her care to [place], attempted colon was performed in 2019. Pt wants to transfer back to [place] because she prefers to have her doctors within the same health system. Records from [facility] are in [place]. Could you please review them and advise on scheduling? Thank you.

## 2020-05-21 NOTE — Telephone Encounter (Signed)
Pt scheduled for consult on 06/21/20 at 11:00am.

## 2020-05-21 NOTE — Telephone Encounter (Signed)
Colonoscopy was planned for 2019, but not attempted due to retained food in stomach during concomitant EGD. Agree with previous GI recommendations for extended 2 day prep with 2 days of clears, Dulcolax x2 days, then start bowel prep for colonoscopy. Ok to schedule direct if no other GI symptoms. If other issues, will need OV first.

## 2020-06-05 ENCOUNTER — Telehealth: Payer: Self-pay | Admitting: Medical

## 2020-06-05 MED ORDER — BUSPIRONE HCL 15 MG PO TABS: 15.0000 mg | ORAL_TABLET | Freq: Three times a day (TID) | ORAL | 0 refills | Status: DC

## 2020-06-05 MED ORDER — METHOCARBAMOL 750 MG PO TABS: 750.0000 mg | ORAL_TABLET | Freq: Three times a day (TID) | ORAL | 0 refills | Status: DC | PRN

## 2020-06-05 MED ORDER — SERTRALINE HCL 25 MG PO TABS: 25.0000 mg | ORAL_TABLET | Freq: Every day | ORAL | 0 refills | Status: DC

## 2020-06-05 NOTE — Telephone Encounter (Signed)
Rx sent 

## 2020-06-05 NOTE — Telephone Encounter (Signed)
Patient would like a 90 day supplies on her prescription   Medication:busPIRone (BUSPAR) 15 MG tablet [[id number]]    sertraline (ZOLOFT) 25 MG tablet [[id number]]   methocarbamol (ROBAXIN) 750 MG tablet [[id number]]      Has the patient contacted their pharmacy? no (If no, request that the patient contact the pharmacy for the refill.) (If yes, when and what did the pharmacy advise?)    Preferred Pharmacy (with phone number or street name):  [facility], [place] [address]  Phone:  [phone] Fax:  [phone]    Agent: Please be advised that RX refills may take up to 3 business days. We ask that you follow-up with your pharmacy.

## 2020-06-06 DIAGNOSIS — G500 Trigeminal neuralgia: Secondary | ICD-10-CM | POA: Insufficient documentation

## 2020-06-06 NOTE — Telephone Encounter (Signed)
Patient called asking to increase the MG of    busPIRone (BUSPAR) 15 MG tablet [[id number]]   sertraline (ZOLOFT) 25 MG tablet [[id number]]  And patient wants a 30 day supply of these medications.

## 2020-06-07 NOTE — Telephone Encounter (Signed)
I had asked pt to follow up in 2 weeks about one month ago. Please get her scheduled next week.   That 2 week instruction is on pt last visit note avs.  Please get her in for follow up early next week.  Might increase dose at that time.

## 2020-06-07 NOTE — Telephone Encounter (Signed)
Appt scheduled

## 2020-06-11 ENCOUNTER — Ambulatory Visit: Payer: 59 | Admitting: Medical

## 2020-06-12 ENCOUNTER — Telehealth: Payer: Self-pay

## 2020-06-12 ENCOUNTER — Telehealth: Payer: Self-pay | Admitting: Medical

## 2020-06-12 ENCOUNTER — Telehealth (INDEPENDENT_AMBULATORY_CARE_PROVIDER_SITE_OTHER): Payer: 59 | Admitting: Medical

## 2020-06-12 ENCOUNTER — Telehealth (INDEPENDENT_AMBULATORY_CARE_PROVIDER_SITE_OTHER): Payer: 59 | Admitting: Cardiology

## 2020-06-12 ENCOUNTER — Other Ambulatory Visit: Payer: Self-pay

## 2020-06-12 ENCOUNTER — Encounter: Payer: Self-pay | Admitting: Cardiology

## 2020-06-12 VITALS — Ht 72.0 in | Wt 163.0 lb

## 2020-06-12 DIAGNOSIS — I10 Essential (primary) hypertension: Secondary | ICD-10-CM | POA: Diagnosis not present

## 2020-06-12 DIAGNOSIS — F32A Depression, unspecified: Secondary | ICD-10-CM | POA: Diagnosis not present

## 2020-06-12 DIAGNOSIS — E782 Mixed hyperlipidemia: Secondary | ICD-10-CM | POA: Diagnosis not present

## 2020-06-12 DIAGNOSIS — F419 Anxiety disorder, unspecified: Secondary | ICD-10-CM

## 2020-06-12 DIAGNOSIS — R5383 Other fatigue: Secondary | ICD-10-CM | POA: Diagnosis not present

## 2020-06-12 DIAGNOSIS — Z20822 Contact with and (suspected) exposure to covid-19: Secondary | ICD-10-CM

## 2020-06-12 DIAGNOSIS — G894 Chronic pain syndrome: Secondary | ICD-10-CM

## 2020-06-12 MED ORDER — BUSPIRONE HCL 15 MG PO TABS: 15.0000 mg | ORAL_TABLET | Freq: Two times a day (BID) | ORAL | 3 refills | Status: DC

## 2020-06-12 MED ORDER — SERTRALINE HCL 50 MG PO TABS: 50.0000 mg | ORAL_TABLET | Freq: Every day | ORAL | 3 refills | Status: DC

## 2020-06-12 MED ORDER — METHOCARBAMOL 750 MG PO TABS: ORAL_TABLET | ORAL | 0 refills | Status: DC

## 2020-06-12 NOTE — Progress Notes (Signed)
Virtual Visit via Video Note   This visit type was conducted due to national recommendations for restrictions regarding the COVID-19 Pandemic (e.g. social distancing) in an effort to limit this patient's exposure and mitigate transmission in our community.  Due to her co-morbid illnesses, this patient is at least at moderate risk for complications without adequate follow up.  This format is felt to be most appropriate for this patient at this time.  All issues noted in this document were discussed and addressed.  A limited physical exam was performed with this format.  Please refer to the patient's chart for her consent to telehealth for [facility].       Date:  06/12/2020   ID:  [name], DOB September 06, 1965, MRN [id number] The patient was identified using 2 identifiers.  Patient Location: Home Provider Location: Home Office   PCP:  Saguier, Iris Pert   [facility] Providers Cardiologist:  None     Evaluation Performed:  Follow-Up Visit  Chief Complaint: Coronary artery disease  History of Present Illness:    [name] is a 55 y.o. female with past medical history of coronary artery disease essential hypertension and dyslipidemia.  She mentions to me that she underwent coronary angiography in 2011 and was Nonobstructive disease.  She denies any problems at this time.  No chest pain orthopnea or PND.  She leads a sedentary lifestyle   The patient does not have symptoms concerning for COVID-19 infection (fever, chills, cough, or new shortness of breath).    Past Medical History:  Diagnosis Date  . ABDOMINAL PAIN-EPIGASTRIC 09/14/2009   Qualifier: Diagnosis of  By: Trellis Paganini PA-c, [name]   . Acute cystitis 07/07/2017  . ANKLE PAIN, LEFT 12/25/2009   Qualifier: Diagnosis of  By: Amil Amen MD, Benjamine Mola    . Anxiety 01/22/2016  . CAD (coronary artery disease) 07/07/2017  . Cardiac murmur 02/01/2019  . Cardiomyopathy, secondary ([place]) 09/06/2009   Qualifier:  Diagnosis of  By: Arvid Right   Formatting of this note might be different from the original. Overview:  Qualifier: Diagnosis of  By: Arvid Right  . Cardiomyopathy, unspecified ([place]) 09/06/2009   Formatting of this note might be different from the original. Overview:  Overview:  Qualifier: Diagnosis of  By: Arvid Right  Overview:  Overview:  Qualifier: Diagnosis of  By: Arvid Right Formatting of this note might be different from the original. Overview:  Qualifier: Diagnosis of  By: Arvid Right  . Cerebrovascular disease 09/13/2012  . Cervical cancer ([place])    had surgery in 2001.  . CHEST PAIN, ATYPICAL 07/30/2009   Qualifier: Diagnosis of  By: Amil Amen MD, Winona Legato of this note might be different from the original. Overview:  Qualifier: Diagnosis of  By: Amil Amen MD, Benjamine Mola  . Chronic back pain   . Chronic kidney disease, stage III (moderate) ([place]) 05/23/2014  . Chronic pain syndrome 01/23/2016  . Coronary artery disease    30% lesions noted  . Current use of insulin ([place]) 05/23/2014  . Depression   . Diabetes mellitus   . DIABETES MELLITUS, TYPE II, UNCONTROLLED 07/30/2009   Qualifier: Diagnosis of  By: Amil Amen MD, Benjamine Mola    . Diabetes type 2, uncontrolled ([place]) 05/23/2014  . Diabetic gastroparesis associated with type 1 diabetes mellitus ([place]) 11/27/2016  . DIABETIC PERIPHERAL NEUROPATHY 09/21/2009   Qualifier: Diagnosis of  By: Amil Amen MD, Benjamine Mola    . Diabetic  peripheral neuropathy ([place]) 01/23/2016  . Dysuria 12/25/2009   Qualifier: Diagnosis of  By: Amil Amen MD, Benjamine Mola    . Elevation of level of transaminase or lactic acid dehydrogenase (LDH) 09/21/2009   Formatting of this note might be different from the original. Overview:  Qualifier: Diagnosis of  By: Amil Amen MD, Benjamine Mola  . Essential hypertension 08/30/2009   Qualifier: Diagnosis of  By: Lovette Cliche, CNA, Christy    . Gastric  atony 07/30/2009   Formatting of this note might be different from the original. Overview:  Qualifier: Diagnosis of  By: Amil Amen MD, Benjamine Mola  . Gastroesophageal reflux disease 07/30/2009   Formatting of this note might be different from the original. Overview:  Overview:  Qualifier: Diagnosis of  By: Amil Amen MD, Elby Showers: Diagnosis of  By: Chester Holstein NP, Ellwood Handler of this note might be different from the original. Overview:  Qualifier: Diagnosis of  By: Amil Amen MD, Elby Showers: Diagnosis of  By: Chester Holstein NP, Nevin Bloodgood  . Gastroparesis 07/30/2009   Qualifier: Diagnosis of  By: Amil Amen MD, Benjamine Mola    . GERD 07/30/2009   Qualifier: Diagnosis of  By: Amil Amen MD, Elby Showers: Diagnosis of  By: Chester Holstein NP, Nevin Bloodgood    . GERD (gastroesophageal reflux disease)   . History of cervical cancer 08/17/2015   Status post partial hysterectomy  Formatting of this note might be different from the original. Status post partial hysterectomy  . HLD (hyperlipidemia) 07/30/2009   Qualifier: Diagnosis of  By: Amil Amen MD, Benjamine Mola    . Hyperlipidemia   . Hyperlipidemia, unspecified 07/30/2009   Formatting of this note might be different from the original. Overview:  Qualifier: Diagnosis of  By: Amil Amen MD, Winona Legato of this note might be different from the original. Overview:  Qualifier: Diagnosis of  By: Amil Amen MD, Benjamine Mola  . Hypertension   . INSOMNIA 09/21/2009   Qualifier: Diagnosis of  By: Amil Amen MD, Benjamine Mola    . Lumbar facet arthropathy 05/14/2016  . Lumbar spinal stenosis 02/11/2018  . Metatarsalgia of both feet 03/11/2017  . Migraine 09/13/2012   Overview:  IMPRESSION: possible abd migraine  . Mixed dyslipidemia 02/01/2019  . Nausea & vomiting 07/07/2017  . Nausea with vomiting, unspecified 09/14/2009   Qualifier: Diagnosis of  By: Shane Crutch, [name]   Formatting of this note might be different from the original. Overview:  Qualifier: Diagnosis of  By:  Shane Crutch, [name]  . Neuropathy   . Nonspecific abnormal findings on radiological and examination of skull and head 09/13/2012  . Overweight (BMI 25.0-29.9) 07/21/2014  . POSITIVE PPD 07/30/2009   Annotation: CXR clear ?  diagnosed in 2/11?  On INH/pyridoxine Qualifier: Diagnosis of  By: Amil Amen MD, Benjamine Mola    . Pure hypercholesterolemia 11/27/2016  . Retention of urine 06/25/2011  . Right flank pain   . TOBACCO ABUSE 09/21/2009   Qualifier: Diagnosis of  By: Amil Amen MD, Winona Legato of this note might be different from the original. Overview:  Qualifier: Diagnosis of  By: Amil Amen MD, Benjamine Mola  . TRANSAMINASES, SERUM, ELEVATED 09/21/2009   Qualifier: Diagnosis of  By: Amil Amen MD, Benjamine Mola    . Trigeminal neuralgia   . Tuberculin test reaction 07/30/2009   Formatting of this note might be different from the original. Overview:  Annotation: CXR clear ?  diagnosed in 2/11?  On INH/pyridoxine Qualifier: Diagnosis of  By: Amil Amen MD, Benjamine Mola  . Type 2 diabetes mellitus with hyperglycemia ([place]) 07/30/2009  Qualifier: Diagnosis of  By: Amil Amen MD, Winona Legato of this note might be different from the original. Overview:  Qualifier: Diagnosis of  By: Amil Amen MD, Benjamine Mola  . Type 2 diabetes mellitus with hyperglycemia, with long-term current use of insulin ([place]) 07/30/2009   Formatting of this note might be different from the original. Overview:  05/30/2015 A1C was 16.7% (!)  Overview:  05/30/2015 A1C was 16.7% (!) Formatting of this note might be different from the original. 05/30/2015 A1C was 16.7% (!)  . Type 2 diabetes mellitus with stage 3 chronic kidney disease ([place]) 09/14/2009   Qualifier: Diagnosis of  By: Trellis Paganini PA-c, [name]   . ULCER-GASTRIC 09/28/2009   Qualifier: Diagnosis of  By: Chester Holstein NP, Nevin Bloodgood    . URINARY TRACT INFECTION 09/21/2009   Qualifier: Diagnosis of  By: Amil Amen MD, Benjamine Mola    . VAGINITIS, CANDIDAL 12/25/2009   Qualifier: Diagnosis of  By:  Amil Amen MD, Benjamine Mola    . Vitamin D deficiency 04/25/2013   Past Surgical History:  Procedure Laterality Date  . ABDOMINAL HYSTERECTOMY     partial  . CHOLECYSTECTOMY       Current Meds  Medication Sig  . albuterol (VENTOLIN HFA) 108 (90 Base) MCG/ACT inhaler Inhale 2 puffs into the lungs every 6 (six) hours as needed for wheezing or shortness of breath.  Marland Kitchen amLODipine (NORVASC) 5 MG tablet Take 1 tablet by mouth once daily  . aspirin 81 MG tablet Take 81 mg by mouth daily.  Marland Kitchen atorvastatin (LIPITOR) 80 MG tablet Take 1 tablet by mouth once daily  . azelastine (ASTELIN) 0.1 % nasal spray Place 2 sprays into both nostrils 2 (two) times daily.  . busPIRone (BUSPAR) 15 MG tablet Take 1 tablet (15 mg total) by mouth 3 (three) times daily.  . carvedilol (COREG) 25 MG tablet Take 1 tablet (25 mg total) by mouth 2 (two) times daily with a meal.  . Cholecalciferol (VITAMIN D3) 400 units CAPS Take 400 mg by mouth daily.  Marland Kitchen FARXIGA 10 MG TABS tablet Take 1 tablet by mouth once daily  . fluticasone (FLONASE) 50 MCG/ACT nasal spray Place 2 sprays into both nostrils daily.  . fluticasone furoate-vilanterol (BREO ELLIPTA) 100-25 MCG/INH AEPB Inhale 1 puff into the lungs daily.  Marland Kitchen gabapentin (NEURONTIN) 300 MG capsule Take 600 mg by mouth 3 (three) times daily.  . insulin degludec (TRESIBA) 100 UNIT/ML FlexTouch Pen Inject 40 Units into the skin daily at 10 pm.  . meloxicam (MOBIC) 7.5 MG tablet Take 1-2 tablets by mouth daily.  . metFORMIN (GLUCOPHAGE-XR) 500 MG 24 hr tablet Take 500 mg by mouth daily.  . methocarbamol (ROBAXIN) 750 MG tablet Take 750 mg by mouth every 8 (eight) hours as needed for muscle spasms.  . nitroGLYCERIN (NITROSTAT) 0.4 MG SL tablet Place 0.4 mg under the tongue every 5 (five) minutes as needed for chest pain.  Marland Kitchen oxycodone (OXY-IR) 5 MG capsule Take 5 mg by mouth as needed for pain.  . promethazine (PHENERGAN) 12.5 MG tablet Take 12.5 mg by mouth every 8 (eight) hours as  needed for nausea/vomiting.  . rizatriptan (MAXALT-MLT) 10 MG disintegrating tablet Take 10 mg by mouth daily as needed for migraine.  . sertraline (ZOLOFT) 25 MG tablet Take 1 tablet (25 mg total) by mouth daily.  Marland Kitchen spironolactone (ALDACTONE) 25 MG tablet Take 1 tablet by mouth once daily  . topiramate (TOPAMAX) 50 MG tablet Take 1 tablet (50 mg total) by mouth 2 (two) times daily.  Allergies:   Insulin aspart, Latex, and Morphine   Social History   Tobacco Use  . Smoking status: Never Smoker  . Smokeless tobacco: Never Used  Vaping Use  . Vaping Use: Never used  Substance Use Topics  . Alcohol use: Yes    Comment: occ  . Drug use: No     Family Hx: The patient's family history includes Diabetes in her father and mother.  ROS:   Please see the history of present illness.    Patient denies any symptoms from a cardiovascular standpoint All other systems reviewed and are negative.   Prior CV studies:   The following studies were reviewed today:  I discussed my findings with the patient  Labs/Other Tests and Data Reviewed:    EKG:  EKG from previous evaluation was visited  Recent Labs: 04/19/2020: ALT 14; BUN 20; Creatinine, Ser 0.99; Hemoglobin 13.2; Platelets 255.0; Potassium 4.2; Sodium 140   Recent Lipid Panel Lab Results  Component Value Date/Time   CHOL 170 11/30/2019 12:20 PM   TRIG 91 11/30/2019 12:20 PM   HDL 61 11/30/2019 12:20 PM   CHOLHDL 2.8 11/30/2019 12:20 PM   LDLCALC 90 11/30/2019 12:20 PM    Wt Readings from Last 3 Encounters:  06/12/20 163 lb (73.9 kg)  04/27/20 160 lb (72.6 kg)  04/19/20 160 lb (72.6 kg)     Risk Assessment/Calculations:      Objective:    Vital Signs:  Ht 6' (1.829 m)   Wt 163 lb (73.9 kg)   BMI 22.11 kg/m    VITAL SIGNS:  reviewed  ASSESSMENT & PLAN:    1. Coronary artery disease: Secondary prevention stressed with the patient.  Importance of compliance with diet and medication stressed and she vocalized  understanding.  She was advised to walk at least half an hour a day 5 days a week and she promises to do so. 2. Essential hypertension: Overall she mentions to me that blood pressure stable.  Diet was emphasized.  Lifestyle modification was encouraged. 3. Mixed dyslipidemia: Patient is on statin therapy.  She will be back in a month for fasting blood work which will be Chem-7 and liver lipid check 4. Patient will be seen in follow-up appointment in 3 months or earlier if the patient has any concerns.  She had multiple questions which were answered to her satisfaction.        COVID-19 Education: The signs and symptoms of COVID-19 were discussed with the patient and how to seek care for testing (follow up with PCP or arrange E-visit).  The importance of social distancing was discussed today.  Time:   Today, I have spent 15 minutes with the patient with telehealth technology discussing the above problems.     Medication Adjustments/Labs and Tests Ordered: Current medicines are reviewed at length with the patient today.  Concerns regarding medicines are outlined above.   Tests Ordered: No orders of the defined types were placed in this encounter.   Medication Changes: No orders of the defined types were placed in this encounter.   Follow Up:  In Person in 3 month(s)  Signed, Jenean Lindau, MD  06/12/2020 9:58 AM    [place]

## 2020-06-12 NOTE — Telephone Encounter (Signed)
Forms given to [name] for virtual visit today

## 2020-06-12 NOTE — Patient Instructions (Addendum)
Recent close exposure to person with COVID and the following day she describes severe fatigue.  History of being vaccinated and boosted.  Very close exposure and onset of fatigue explained to get COVID test today.  Test once today and if negative chest again on Thursday.  If any positive test notify us and discussed potential treatment plan.  Make sure stays well-hydrated and notify us of any worsening or changing signs and symptoms.   Also even before recent acute status fatigue she reports daily fatigue.  On last visit I thought she has been thyromegaly but ultrasound of the thyroid came back unremarkable.  I asked her to get TSH and T4 but it looks like those were not done.  So when office for follow-up will get TSH, T4 CBC and metabolic panel.  Persisting high level of depression and anxiety.  So much so that patient reports debilitating and she feels like she cannot work.  We will increase sertraline to 50 mg daily and refilled BuSpar same dose.  Explained to patient that if she is not having clinical improvement and will refer to psychiatrist.  We will fill out FMLA/disability paperwork so patient can have additional time off.  History of chronic back pain.  History of lumbar spinal stenosis.  Patient is on oxycodone for pain and uses Robaxin for muscle spasm.  Result Robaxin today.  Follow-up in 3 weeks or as needed.

## 2020-06-12 NOTE — Progress Notes (Signed)
Subjective:    Patient ID: [name], female    DOB: [birth year], 55 y.o.   MRN: [id number]  HPI  Virtual Visit via Video Note  I connected with [name] on 06/12/20 at  3:40 PM EDT by a video enabled telemedicine application and verified that I am speaking with the correct person using two identifiers.  Location: Patient: home Provider: office   I discussed the limitations of evaluation and management by telemedicine and the availability of in person appointments. The patient expressed understanding and agreed to proceed.  History of Present Illness: Pt states since this weekend has felt tired over the weekend with decrease appetite. No bodyaches, no fevers or chills.   Pt has mild itchy irritate throat. No description of swollen tonsils or swollen lips.  Pt friend states friend has covid. She had contact with that friend on Friday. Pt symptoms started on Saturday night.  Still fatigued but beginning to feel better.  Pt has been vaccinated twice with covid and she got booster 04-27-2020.  Pt has not done covid test.   Hx of depression and some anxiety. Pt states she is still feeling depressed despite being on sertarline 25 mg daily. She wants increase dose. Pt is anxious.Pt is on buspar three times daily.  On last visit back in March last follow-up 2 weeks after starting sertraline.  That appointment was missed.  Describes having severe debilitating depression that causes a lack of motivation and feels like able to do her daily work duties.  Indicates work is a source of depression and stress.  On review of last note she was describing diazepam family member and will work as a significant stressor that worsens her mood.  She feels like she cannot work presently and she wants me to fill out additional FMLA type paperwork.  I discussed with her we will fill out paperwork.  We will need to see how she responds to medication changes and if not improving then will refer  to psychiatrist.  Chronic back pain. She is on oxycodone in past for this.  Also on Robaxin and she request refill of that today.     Observations/Objective: General-no acute distress, pleasant, oriented.  Appears to have flat affect. Lungs- on inspection lungs appear unlabored. Neck- no tracheal deviation or jvd on inspection. Neuro- gross motor function appears intact.  Assessment and Plan: Recent close exposure to person with COVID and the following day she describes severe fatigue.  History of being vaccinated and boosted.  Very close exposure and onset of fatigue explained to get COVID test today.  Test once today and if negative chest again on Thursday.  If any positive test notify us and discussed potential treatment plan.  Make sure stays Peck-hydrated and notify us of any worsening or changing signs and symptoms.   Also even before recent acute status fatigue she reports daily fatigue.  On last visit I thought she has been thyromegaly but ultrasound of the thyroid came back unremarkable.  I asked her to get TSH and T4 but it looks like those were not done.  So when office for follow-up will get TSH, T4 CBC and metabolic panel.  Persisting high level of depression and anxiety.  So much so that patient reports debilitating and she feels like she cannot work.  We will increase sertraline to 50 mg daily and refilled BuSpar same dose.  Explained to patient that if she is not having clinical improvement and will refer to psychiatrist.  We will fill out FMLA/disability paperwork so patient can have additional time off.  History of chronic back pain.  History of lumbar spinal stenosis.  Patient is on oxycodone for pain and uses Robaxin for muscle spasm.  Result Robaxin today.  Follow-up in 3 weeks or as needed.  Follow Up Instructions:    I discussed the assessment and treatment plan with the patient. The patient was provided an opportunity to ask questions and all were answered. The  patient agreed with the plan and demonstrated an understanding of the instructions.   The patient was advised to call back or seek an in-person evaluation if the symptoms worsen or if the condition fails to improve as anticipated.  Time spent with patient today was 43  minutes which consisted of chart revdiew, discussing diagnosis, work up, treatment and documentation.Mackie Pai, PA-C   Review of Systems  Constitutional: Negative for chills and fatigue.  Respiratory: Negative for cough, chest tightness, shortness of breath and wheezing.   Cardiovascular: Negative for chest pain and palpitations.  Gastrointestinal: Negative for abdominal pain.  Musculoskeletal: Negative for back pain.  Skin: Negative for rash.  Neurological: Negative for dizziness and headaches.  Hematological: Negative for adenopathy. Does not bruise/bleed easily.  Psychiatric/Behavioral: Positive for dysphoric mood. Negative for confusion and suicidal ideas. The patient is nervous/anxious.        Pt indicates she thinks eating less due to depression       Objective:   Physical Exam        Assessment & Plan:

## 2020-06-12 NOTE — Telephone Encounter (Signed)
  Patient Consent for Virtual Visit         [name] has provided verbal consent on 06/12/2020 for a virtual visit (video or telephone).   CONSENT FOR VIRTUAL VISIT FOR:  [name]  By participating in this virtual visit I agree to the following:  I hereby voluntarily request, consent and authorize [facility] and its employed or contracted physicians, physician assistants, nurse practitioners or other licensed health care professionals (the Practitioner), to provide me with telemedicine health care services (the "Services") as deemed necessary by the treating Practitioner. I acknowledge and consent to receive the Services by the Practitioner via telemedicine. I understand that the telemedicine visit will involve communicating with the Practitioner through live audiovisual communication technology and the disclosure of certain medical information by electronic transmission. I acknowledge that I have been given the opportunity to request an in-person assessment or other available alternative prior to the telemedicine visit and am voluntarily participating in the telemedicine visit.  I understand that I have the right to withhold or withdraw my consent to the use of telemedicine in the course of my care at any time, without affecting my right to future care or treatment, and that the Practitioner or I may terminate the telemedicine visit at any time. I understand that I have the right to inspect all information obtained and/or recorded in the course of the telemedicine visit and may receive copies of available information for a reasonable fee.  I understand that some of the potential risks of receiving the Services via telemedicine include:  Marland Kitchen Delay or interruption in medical evaluation due to technological equipment failure or disruption; . Information transmitted may not be sufficient (e.g. poor resolution of images) to allow for appropriate medical decision making by the  Practitioner; and/or  . In rare instances, security protocols could fail, causing a breach of personal health information.  Furthermore, I acknowledge that it is my responsibility to provide information about my medical history, conditions and care that is complete and accurate to the best of my ability. I acknowledge that Practitioner's advice, recommendations, and/or decision may be based on factors not within their control, such as incomplete or inaccurate data provided by me or distortions of diagnostic images or specimens that may result from electronic transmissions. I understand that the practice of medicine is not an exact science and that Practitioner makes no warranties or guarantees regarding treatment outcomes. I acknowledge that a copy of this consent can be made available to me via my patient portal ([facility]), or I can request a printed copy by calling the office of [facility].    I understand that my insurance will be billed for this visit.   I have read or had this consent read to me. . I understand the contents of this consent, which adequately explains the benefits and risks of the Services being provided via telemedicine.  . I have been provided ample opportunity to ask questions regarding this consent and the Services and have had my questions answered to my satisfaction. . I give my informed consent for the services to be provided through the use of telemedicine in my medical care

## 2020-06-12 NOTE — Patient Instructions (Signed)
Medication Instructions:  Your physician recommends that you continue on your current medications as directed. Please refer to the Current Medication list given to you today.  *If you need a refill on your cardiac medications before your next appointment, please call your pharmacy*   Lab Work: Your physician recommends that you return for lab work in: [place] BMP, Lipids, LFT'S If you have labs (blood work) drawn today and your tests are completely normal, you will receive your results only by: Marland Kitchen MyChart Message (if you have MyChart) OR . A paper copy in the mail If you have any lab test that is abnormal or we need to change your treatment, we will call you to review the results.   Testing/Procedures: None   Follow-Up: At [facility], you and your health needs are our priority.  As part of our continuing mission to provide you with exceptional heart care, we have created designated Provider Care Teams.  These Care Teams include your primary Cardiologist (physician) and Advanced Practice Providers (APPs -  Physician Assistants and Nurse Practitioners) who all work together to provide you with the care you need, when you need it.  We recommend signing up for the patient portal called "MyChart".  Sign up information is provided on this After Visit Summary.  MyChart is used to connect with patients for Virtual Visits (Telemedicine).  Patients are able to view lab/test results, encounter notes, upcoming appointments, etc.  Non-urgent messages can be sent to your provider as well.   To learn more about what you can do with MyChart, go to [email or web address].    Your next appointment:   3 month(s)  The format for your next appointment:   In Person  Provider:   Jyl Heinz, MD   Other Instructions

## 2020-06-12 NOTE — Telephone Encounter (Signed)
Received facsimile from our HIM Dept. On 06/11/20 from [facility] requesting information from provider.  Document given to CMA for completion.

## 2020-06-12 NOTE — Telephone Encounter (Signed)
Will you fax over [employer] paperwork for pt. Then give copy to jennifer. They want copy of records 04-27-2020

## 2020-06-13 NOTE — Telephone Encounter (Signed)
Formed faxed

## 2020-06-18 ENCOUNTER — Encounter: Payer: 59 | Admitting: Family Medicine

## 2020-06-21 ENCOUNTER — Ambulatory Visit: Payer: 59 | Admitting: Gastroenterology

## 2020-06-21 ENCOUNTER — Other Ambulatory Visit: Payer: Self-pay

## 2020-06-21 MED ORDER — MELOXICAM 7.5 MG PO TABS: 7.5000 mg | ORAL_TABLET | Freq: Every day | ORAL | 1 refills | Status: DC

## 2020-06-21 MED ORDER — OXYCODONE HCL 5 MG PO CAPS: 5.0000 mg | ORAL_CAPSULE | ORAL | 0 refills | Status: DC | PRN

## 2020-06-21 NOTE — Telephone Encounter (Signed)
Requesting refill on oxycodone 5mg  and meloxicam 7.5mg  to [facility] on [address] in [place].

## 2020-06-21 NOTE — Telephone Encounter (Signed)
Rx oxycodone sent to pt pharmacy. 

## 2020-06-21 NOTE — Telephone Encounter (Signed)
I sent in the Meloxicam aready     Requesting: oxycodone Contract:10/05/19 UDS:09/27/2019 Last Visit:06/12/20 Next Visit: n/a Last Refill:  Please Advise

## 2020-07-02 ENCOUNTER — Telehealth: Payer: Self-pay

## 2020-07-02 NOTE — Telephone Encounter (Signed)
Pt needs Oxycodone sent to [facility] in [facility], [address] [place], [place], Ph # [phone].  Pt is out of town and requesting her refill be sent to this location.

## 2020-07-02 NOTE — Telephone Encounter (Signed)
Patient states she will return to [place] on 05/10/2019   Requesting: oxycodone Contract: 10/05/2019 UDS:09/27/2019 Last Visit:06/12/20 Next Visit:n/a Last Refill:06/21/20  Please Advise

## 2020-07-02 NOTE — Telephone Encounter (Signed)
I reviewed epic. Her contract is up to date but drug screen states future? I don't see the result.  Is she just visiting or did she move? When will she back you stated march 21. Yo meant 2023?  Her contract and uds needs to be up to date. August contract expires  Controlled meds need to be every 4 months in office.   [place] is about 2.5 hours away.   If uds not up to date then could give limited rx oxycodone 30 tabs and have her make in office visit with me within one month.  Also let her know if in [facility] and something comes up can't do video visit since I don't have [place] state license.   She may need to investigate urgent cares in [place] if planning to come back here very rarely?

## 2020-07-03 ENCOUNTER — Telehealth: Payer: Self-pay | Admitting: Medical

## 2020-07-03 MED ORDER — OXYCODONE HCL 5 MG PO CAPS: ORAL_CAPSULE | ORAL | 0 refills | Status: DC

## 2020-07-03 NOTE — Telephone Encounter (Signed)
Opened to send in new rx oxycodone.

## 2020-07-03 NOTE — Telephone Encounter (Signed)
I sent in 10 tab rx of oxy to pharmacy in [facility]. If you get med request for me to send to different pharmacy would you go ahead and load that pharmacy for me.  Ask pt to follow up next week to have controlled med visit.

## 2020-07-03 NOTE — Addendum Note (Signed)
Addended by: Anabel Halon on: 07/03/2020 11:51 AM   Modules accepted: Orders

## 2020-07-03 NOTE — Telephone Encounter (Signed)
Patient is on vacation returning next Monday the 30th

## 2020-07-03 NOTE — Telephone Encounter (Signed)
PT notified states she is in the middle of something & will call back to schedule appt next week

## 2020-08-03 ENCOUNTER — Ambulatory Visit (INDEPENDENT_AMBULATORY_CARE_PROVIDER_SITE_OTHER): Payer: 59 | Admitting: Medical

## 2020-08-03 ENCOUNTER — Other Ambulatory Visit: Payer: Self-pay | Admitting: Medical

## 2020-08-03 ENCOUNTER — Other Ambulatory Visit: Payer: Self-pay

## 2020-08-03 VITALS — BP 144/93 | HR 81 | Resp 20 | Ht 72.0 in | Wt 171.0 lb

## 2020-08-03 DIAGNOSIS — G8929 Other chronic pain: Secondary | ICD-10-CM

## 2020-08-03 DIAGNOSIS — J4520 Mild intermittent asthma, uncomplicated: Secondary | ICD-10-CM | POA: Diagnosis not present

## 2020-08-03 DIAGNOSIS — F32A Depression, unspecified: Secondary | ICD-10-CM

## 2020-08-03 DIAGNOSIS — G894 Chronic pain syndrome: Secondary | ICD-10-CM

## 2020-08-03 DIAGNOSIS — R102 Pelvic and perineal pain: Secondary | ICD-10-CM | POA: Diagnosis not present

## 2020-08-03 DIAGNOSIS — F419 Anxiety disorder, unspecified: Secondary | ICD-10-CM | POA: Diagnosis not present

## 2020-08-03 DIAGNOSIS — M5440 Lumbago with sciatica, unspecified side: Secondary | ICD-10-CM

## 2020-08-03 LAB — POC URINALSYSI DIPSTICK (AUTOMATED)
Bilirubin, UA: NEGATIVE
Blood, UA: NEGATIVE
Glucose, UA: POSITIVE — AB
Ketones, UA: NEGATIVE
Leukocytes, UA: NEGATIVE
Nitrite, UA: POSITIVE
Protein, UA: NEGATIVE
Spec Grav, UA: 1.015 (ref 1.010–1.025)
Urobilinogen, UA: 0.2 E.U./dL
pH, UA: 6 (ref 5.0–8.0)

## 2020-08-03 MED ORDER — ALBUTEROL SULFATE HFA 108 (90 BASE) MCG/ACT IN AERS: 2.0000 | INHALATION_SPRAY | Freq: Four times a day (QID) | RESPIRATORY_TRACT | 2 refills | Status: DC | PRN

## 2020-08-03 MED ORDER — OXYCODONE-ACETAMINOPHEN 10-325 MG PO TABS: 1.0000 | ORAL_TABLET | Freq: Three times a day (TID) | ORAL | 0 refills | Status: AC | PRN

## 2020-08-03 MED ORDER — FLUTICASONE FUROATE-VILANTEROL 100-25 MCG/INH IN AEPB: 1.0000 | INHALATION_SPRAY | Freq: Every day | RESPIRATORY_TRACT | 2 refills | Status: DC

## 2020-08-03 MED ORDER — CEPHALEXIN 500 MG PO CAPS: 500.0000 mg | ORAL_CAPSULE | Freq: Two times a day (BID) | ORAL | 0 refills | Status: DC

## 2020-08-03 MED ORDER — SERTRALINE HCL 100 MG PO TABS: 100.0000 mg | ORAL_TABLET | Freq: Every day | ORAL | 3 refills | Status: DC

## 2020-08-03 NOTE — Progress Notes (Signed)
Subjective:    Patient ID: [name], female    DOB: 1965/12/08, 55 y.o.   MRN: [id number]  HPI  Last time I talked with pt by video her main concern was anxiety and depression.   "Hx of depression and some anxiety. Pt states she is still feeling depressed despite being on sertarline 25 mg daily. She wants increase dose. Pt is anxious.Pt is on buspar three times daily.  On last visit back in March last follow-up 2 weeks after starting sertraline.  That appointment was missed.  Describes having severe debilitating depression that causes a lack of motivation and feels like able to do her daily work duties.  Indicates work is a source of depression and stress.  On review of last note she was describing dying family member and work as a significant stressor that worsens her mood.  She feels like she cannot work presently and she wants me to fill out additional FMLA type paperwork.  I discussed with her we will fill out paperwork.  We will need to see how she responds to medication changes and if not improving then will refer to psychiatrist.   Pt currently on sertraline 50 mg daily. She is also on buspar 15 mg twice daily  Pt continues not to work due to above. She has other conditions pyschiatrc condition has played large role.   Pt phq 9 score is 23. GAD-7 score is 18. Has dealt with depression since 55 years of age.    Review of Systems  Constitutional:  Negative for chills, fatigue and fever.  HENT:  Negative for congestion, ear discharge, ear pain and hearing loss.   Respiratory:  Positive for wheezing. Negative for cough, chest tightness and shortness of breath.        Wheezing every other day since ran out of inhalers and very hot weather.  Cardiovascular:  Negative for chest pain and palpitations.  Gastrointestinal:  Negative for abdominal pain, blood in stool, constipation, diarrhea and vomiting.       Suprapubic pain/pressure.  Genitourinary:  Positive for frequency.  Negative for difficulty urinating, dysuria, hematuria and urgency.  Musculoskeletal:  Negative for back pain.    Past Medical History:  Diagnosis Date   ABDOMINAL PAIN-EPIGASTRIC 09/14/2009   Qualifier: Diagnosis of  By: Trellis Paganini PA-c, [name]    Acute cystitis 07/07/2017   ANKLE PAIN, LEFT 12/25/2009   Qualifier: Diagnosis of  By: Amil Amen MD, [name]     Anxiety 01/22/2016   CAD (coronary artery disease) 07/07/2017   Cardiac murmur 02/01/2019   Cardiomyopathy, secondary ([place]) 09/06/2009   Qualifier: Diagnosis of  By: Arvid Right   Formatting of this note might be different from the original. Overview:  Qualifier: Diagnosis of  By: Arvid Right   Cardiomyopathy, unspecified ([place]) 09/06/2009   Formatting of this note might be different from the original. Overview:  Overview:  Qualifier: Diagnosis of  By: Arvid Right  Overview:  Overview:  Qualifier: Diagnosis of  By: Arvid Right Formatting of this note might be different from the original. Overview:  Qualifier: Diagnosis of  By: Arvid Right   Cerebrovascular disease 09/13/2012   Cervical cancer ([place])    had surgery in 2001.   CHEST PAIN, ATYPICAL 07/30/2009   Qualifier: Diagnosis of  By: Amil Amen MD, Winona Legato of this note might be different from the original. Overview:  Qualifier: Diagnosis of  By: Amil Amen MD, Benjamine Mola  Chronic back pain    Chronic kidney disease, stage III (moderate) (HCC) 05/23/2014   Chronic pain syndrome 01/23/2016   Coronary artery disease    30% lesions noted   Current use of insulin ([place]) 05/23/2014   Depression    Diabetes mellitus    DIABETES MELLITUS, TYPE II, UNCONTROLLED 07/30/2009   Qualifier: Diagnosis of  By: Amil Amen MD, [name]     Diabetes type 2, uncontrolled ([place]) 05/23/2014   Diabetic gastroparesis associated with type 1 diabetes mellitus ([place]) 11/27/2016   DIABETIC PERIPHERAL NEUROPATHY 09/21/2009    Qualifier: Diagnosis of  By: Amil Amen MD, [name]     Diabetic peripheral neuropathy ([place]) 01/23/2016   Dysuria 12/25/2009   Qualifier: Diagnosis of  By: Amil Amen MD, [name]     Elevation of level of transaminase or lactic acid dehydrogenase (LDH) 09/21/2009   Formatting of this note might be different from the original. Overview:  Qualifier: Diagnosis of  By: Amil Amen MD, [place]   Essential hypertension 08/30/2009   Qualifier: Diagnosis of  By: Lovette Cliche, CNA, Christy     Gastric atony 07/30/2009   Formatting of this note might be different from the original. Overview:  Qualifier: Diagnosis of  By: Amil Amen MD, [place]   Gastroesophageal reflux disease 07/30/2009   Formatting of this note might be different from the original. Overview:  Overview:  Qualifier: Diagnosis of  By: Amil Amen MD, Elby Showers: Diagnosis of  By: Chester Holstein NP, Ellwood Handler of this note might be different from the original. Overview:  Qualifier: Diagnosis of  By: Amil Amen MD, Elby Showers: Diagnosis of  By: Chester Holstein NP, Nevin Bloodgood   Gastroparesis 07/30/2009   Qualifier: Diagnosis of  By: Amil Amen MD, [name]     GERD 07/30/2009   Qualifier: Diagnosis of  By: Amil Amen MD, Elby Showers: Diagnosis of  By: Chester Holstein NP, Nevin Bloodgood     GERD (gastroesophageal reflux disease)    History of cervical cancer 08/17/2015   Status post partial hysterectomy  Formatting of this note might be different from the original. Status post partial hysterectomy   HLD (hyperlipidemia) 07/30/2009   Qualifier: Diagnosis of  By: Amil Amen MD, [name]     Hyperlipidemia    Hyperlipidemia, unspecified 07/30/2009   Formatting of this note might be different from the original. Overview:  Qualifier: Diagnosis of  By: Amil Amen MD, Winona Legato of this note might be different from the original. Overview:  Qualifier: Diagnosis of  By: Amil Amen MD, [name]   Hypertension    INSOMNIA 09/21/2009   Qualifier: Diagnosis of  By:  Amil Amen MD, [name]     Lumbar facet arthropathy 05/14/2016   Lumbar spinal stenosis 02/11/2018   Metatarsalgia of both feet 03/11/2017   Migraine 09/13/2012   Overview:  IMPRESSION: possible abd migraine   Mixed dyslipidemia 02/01/2019   Nausea & vomiting 07/07/2017   Nausea with vomiting, unspecified 09/14/2009   Qualifier: Diagnosis of  By: Shane Crutch, [name]   Formatting of this note might be different from the original. Overview:  Qualifier: Diagnosis of  By: Shane Crutch, [name]   Neuropathy    Nonspecific abnormal findings on radiological and examination of skull and head 09/13/2012   Overweight (BMI 25.0-29.9) 07/21/2014   POSITIVE PPD 07/30/2009   Annotation: CXR clear ?  diagnosed in 2/11?  On INH/pyridoxine Qualifier: Diagnosis of  By: Amil Amen MD, [name]     Pure hypercholesterolemia 11/27/2016   Retention of urine 06/25/2011   Right flank pain    TOBACCO  ABUSE 09/21/2009   Qualifier: Diagnosis of  By: Amil Amen MD, Winona Legato of this note might be different from the original. Overview:  Qualifier: Diagnosis of  By: Amil Amen MD, Elinor Parkinson, SERUM, ELEVATED 09/21/2009   Qualifier: Diagnosis of  By: Amil Amen MD, [name]     Trigeminal neuralgia    Tuberculin test reaction 07/30/2009   Formatting of this note might be different from the original. Overview:  Annotation: CXR clear ?  diagnosed in 2/11?  On INH/pyridoxine Qualifier: Diagnosis of  By: Amil Amen MD, [name]   Type 2 diabetes mellitus with hyperglycemia ([place]) 07/30/2009   Qualifier: Diagnosis of  By: Amil Amen MD, Winona Legato of this note might be different from the original. Overview:  Qualifier: Diagnosis of  By: Amil Amen MD, [name]   Type 2 diabetes mellitus with hyperglycemia, with long-term current use of insulin ([place]) 07/30/2009   Formatting of this note might be different from the original. Overview:  05/30/2015 A1C was 16.7% (!)  Overview:  05/30/2015 A1C was 16.7% (!) Formatting  of this note might be different from the original. 05/30/2015 A1C was 16.7% (!)   Type 2 diabetes mellitus with stage 3 chronic kidney disease ([place]) 09/14/2009   Qualifier: Diagnosis of  By: Shane Crutch, [name]    ULCER-GASTRIC 09/28/2009   Qualifier: Diagnosis of  By: Chester Holstein NP, Paula     URINARY TRACT INFECTION 09/21/2009   Qualifier: Diagnosis of  By: Amil Amen MD, Louanne Belton, CANDIDAL 12/25/2009   Qualifier: Diagnosis of  By: Amil Amen MD, Tessa Lerner D deficiency 04/25/2013     Social History   Socioeconomic History   Marital status: Single    Spouse name: Not on file   Number of children: Not on file   Years of education: Not on file   Highest education level: Not on file  Occupational History   Not on file  Tobacco Use   Smoking status: Never   Smokeless tobacco: Never  Vaping Use   Vaping Use: Never used  Substance and Sexual Activity   Alcohol use: Yes    Comment: occ   Drug use: No   Sexual activity: Not on file  Other Topics Concern   Not on file  Social History Narrative   Not on file   Social Determinants of Health   Financial Resource Strain: Not on file  Food Insecurity: Not on file  Transportation Needs: Not on file  Physical Activity: Not on file  Stress: Not on file  Social Connections: Not on file  Intimate Partner Violence: Not on file    Past Surgical History:  Procedure Laterality Date   ABDOMINAL HYSTERECTOMY     partial   CHOLECYSTECTOMY      Family History  Problem Relation Age of Onset   Diabetes Mother    Diabetes Father     Allergies  Allergen Reactions   Insulin Aspart Other (See Comments) and Swelling    adverse reaction to Novolog    Latex Itching and Rash   Morphine Itching and Rash    Current Outpatient Medications on File Prior to Visit  Medication Sig Dispense Refill   albuterol (VENTOLIN HFA) 108 (90 Base) MCG/ACT inhaler Inhale 2 puffs into the lungs every 6 (six) hours as needed for  wheezing or shortness of breath.     amLODipine (NORVASC) 5 MG tablet Take 1 tablet by mouth once daily 30 tablet 0   aspirin  81 MG tablet Take 81 mg by mouth daily.     atorvastatin (LIPITOR) 80 MG tablet Take 1 tablet by mouth once daily 60 tablet 0   azelastine (ASTELIN) 0.1 % nasal spray Place 2 sprays into both nostrils 2 (two) times daily. 30 mL 1   busPIRone (BUSPAR) 15 MG tablet Take 1 tablet (15 mg total) by mouth 3 (three) times daily. 21 tablet 0   busPIRone (BUSPAR) 15 MG tablet Take 1 tablet (15 mg total) by mouth 2 (two) times daily. 90 tablet 3   carvedilol (COREG) 25 MG tablet Take 1 tablet (25 mg total) by mouth 2 (two) times daily with a meal. 180 tablet 0   Cholecalciferol (VITAMIN D3) 400 units CAPS Take 400 mg by mouth daily.     FARXIGA 10 MG TABS tablet Take 1 tablet by mouth once daily 90 tablet 0   fluticasone (FLONASE) 50 MCG/ACT nasal spray Place 2 sprays into both nostrils daily. 16 g 1   fluticasone furoate-vilanterol (BREO ELLIPTA) 100-25 MCG/INH AEPB Inhale 1 puff into the lungs daily. 100 each 2   gabapentin (NEURONTIN) 300 MG capsule Take 600 mg by mouth 3 (three) times daily.     insulin degludec (TRESIBA) 100 UNIT/ML FlexTouch Pen Inject 40 Units into the skin daily at 10 pm. 100 mL 2   meloxicam (MOBIC) 7.5 MG tablet Take 1-2 tablets (7.5-15 mg total) by mouth daily. 60 tablet 1   metFORMIN (GLUCOPHAGE-XR) 500 MG 24 hr tablet Take 500 mg by mouth daily.     methocarbamol (ROBAXIN) 750 MG tablet Take 750 mg by mouth every 8 (eight) hours as needed for muscle spasms.     methocarbamol (ROBAXIN-750) 750 MG tablet 1 tab po q day prn lower back pain/spasms 30 tablet 0   nitroGLYCERIN (NITROSTAT) 0.4 MG SL tablet Place 0.4 mg under the tongue every 5 (five) minutes as needed for chest pain.     oxycodone (OXY-IR) 5 MG capsule 1 tab po bid prn severe pain 10 capsule 0   promethazine (PHENERGAN) 12.5 MG tablet Take 12.5 mg by mouth every 8 (eight) hours as needed for  nausea/vomiting.     rizatriptan (MAXALT-MLT) 10 MG disintegrating tablet Take 10 mg by mouth daily as needed for migraine.     sertraline (ZOLOFT) 50 MG tablet Take 1 tablet (50 mg total) by mouth daily. 30 tablet 3   spironolactone (ALDACTONE) 25 MG tablet Take 1 tablet by mouth once daily 30 tablet 0   topiramate (TOPAMAX) 50 MG tablet Take 1 tablet (50 mg total) by mouth 2 (two) times daily. 60 tablet 12   No current facility-administered medications on file prior to visit.    BP (!) 144/93   Pulse 81   Resp 20   Ht 6' (1.829 m)   Wt 171 lb (77.6 kg)   SpO2 99%   BMI 23.19 kg/m       Objective:   Physical Exam  General- No acute distress. Pleasant patient. Neck- Full range of motion, no jvd Lungs- Clear, even and unlabored. Heart- regular rate and rhythm. Neurologic- CNII- XII grossly intact.  Abdomen- soft, nt, nd, +bs,no rebound or guarding. Back- no cva tenderness.      Assessment & Plan:   Depression and anxiety not well controlled despite current tx. Increase sertraline to 100 mg daily and continue buspar. Placed referral to psychiatrist, gave list for pt to call specialist, explained referral process and advised notify us which one she called and  chose.  For frequent urination and suspicious UA do urine culture and rx keflex.  For asthma and recent wheezing refilled inhalers since out for 2 weeks.  For chronic back pain update your controlled med contract, refill limited pain med refill and you will return for uds on Tuesday since lab closed presently.  Follow up in 2 weeks for wellness exam and fasting labs.  Mackie Pai, PA-C   Time spent with patient today was 51 minutes which consisted of chart review, discussing diagnosis, work up treatment, answering pt questions  and documentation.

## 2020-08-03 NOTE — Patient Instructions (Addendum)
Depression and anxiety not well controlled despite current tx. Increase sertraline to 100 mg daily and continue buspar. Placed referral to psychiatrist, gave list for pt to call specialist, explained referral process and advised notify us which one she called and chose.  For frequent urination and suspicious UA do urine culture and rx keflex.  For asthma and recent wheezing refilled inhalers since out for 2 weeks.  For chronic back pain update your controlled med contract, refill limited pain med refill and you will return for uds on Tuesday since lab closed presently.  Follow up in 2 weeks for wellness exam and fasting labs.

## 2020-08-05 ENCOUNTER — Other Ambulatory Visit: Payer: Self-pay | Admitting: Medical

## 2020-08-05 LAB — URINE CULTURE
MICRO NUMBER:: 12047783
SPECIMEN QUALITY:: ADEQUATE

## 2020-08-07 ENCOUNTER — Telehealth: Payer: Self-pay

## 2020-08-07 ENCOUNTER — Other Ambulatory Visit: Payer: 59

## 2020-08-07 NOTE — Telephone Encounter (Signed)
Pt called stating medication: oxyCODONE-acetaminophen (PERCOCET   Is making her nausea

## 2020-08-08 MED ORDER — ONDANSETRON 4 MG PO TBDP: 4.0000 mg | ORAL_TABLET | Freq: Three times a day (TID) | ORAL | 0 refills | Status: DC | PRN

## 2020-08-08 NOTE — Addendum Note (Signed)
Addended by: Anabel Halon on: 08/08/2020 09:03 AM   Modules accepted: Orders

## 2020-08-08 NOTE — Telephone Encounter (Signed)
Rx zofran sent in for pt nausea. Her thoughts of percocet making her nausea. Not sure about that as she has been on medication for a while. Please make sure she is scheduled for wellness exam in about 2 weeks. If nausea worsens or other associated signs/symptoms be seen in office before then. UC or ED options as well.

## 2020-08-08 NOTE — Telephone Encounter (Signed)
Called pt she stated she would prefer another medication instead of zofran

## 2020-08-09 MED ORDER — PROMETHAZINE HCL 12.5 MG PO TABS: 12.5000 mg | ORAL_TABLET | Freq: Three times a day (TID) | ORAL | 0 refills | Status: DC | PRN

## 2020-08-09 NOTE — Addendum Note (Signed)
Addended by: Anabel Halon on: 08/09/2020 08:58 AM   Modules accepted: Orders

## 2020-08-09 NOTE — Telephone Encounter (Signed)
Patient states she wants Oxy Hcl , nothing with Acetaminophen ... nomore nausea medication

## 2020-08-09 NOTE — Telephone Encounter (Signed)
CPE scheduled  

## 2020-08-10 ENCOUNTER — Telehealth: Payer: Self-pay | Admitting: Medical

## 2020-08-10 MED ORDER — PROMETHAZINE HCL 12.5 MG PO TABS: 12.5000 mg | ORAL_TABLET | Freq: Three times a day (TID) | ORAL | 0 refills | Status: DC | PRN

## 2020-08-10 NOTE — Telephone Encounter (Signed)
Phenergan rx sent.

## 2020-08-10 NOTE — Telephone Encounter (Signed)
Controlled contract updated/modified on 08/03/20 & she was given 10 tablets on 08/03/2020 of the percocet

## 2020-08-10 NOTE — Telephone Encounter (Signed)
Opened to review 

## 2020-08-14 ENCOUNTER — Telehealth: Payer: Self-pay | Admitting: Medical

## 2020-08-14 MED ORDER — OXYCODONE HCL 5 MG PO CAPS: ORAL_CAPSULE | ORAL | 0 refills | Status: DC

## 2020-08-14 NOTE — Telephone Encounter (Signed)
Script cancelled. 

## 2020-08-14 NOTE — Addendum Note (Signed)
Addended by: Anabel Halon on: 08/14/2020 11:17 AM   Modules accepted: Orders

## 2020-08-14 NOTE — Telephone Encounter (Signed)
Rx oxy ir sent to pt pharmacy. When she is in for wellness exam in about 2 weeks will need to change/update  her contract.

## 2020-08-14 NOTE — Telephone Encounter (Signed)
Patient states her contract is for a 10 mg not 5 mg. Patient would like a correction.

## 2020-08-14 NOTE — Telephone Encounter (Signed)
Opened to review and send in rx.

## 2020-08-14 NOTE — Telephone Encounter (Signed)
Need that 5 mg prescription canceled. Then can send in higher dose. Advise pt not to pick up the 5 mg dose. Then I will sent in the 10 mg dose.

## 2020-08-22 ENCOUNTER — Other Ambulatory Visit: Payer: Self-pay | Admitting: Medical

## 2020-08-23 ENCOUNTER — Other Ambulatory Visit: Payer: Self-pay

## 2020-08-24 ENCOUNTER — Encounter: Payer: Self-pay | Admitting: Medical

## 2020-08-24 ENCOUNTER — Ambulatory Visit (INDEPENDENT_AMBULATORY_CARE_PROVIDER_SITE_OTHER): Payer: 59 | Admitting: Medical

## 2020-08-24 ENCOUNTER — Telehealth: Payer: Self-pay

## 2020-08-24 VITALS — BP 130/85 | HR 88 | Temp 98.1°F | Resp 18 | Ht 72.0 in | Wt 170.0 lb

## 2020-08-24 DIAGNOSIS — R5383 Other fatigue: Secondary | ICD-10-CM

## 2020-08-24 DIAGNOSIS — E1165 Type 2 diabetes mellitus with hyperglycemia: Secondary | ICD-10-CM

## 2020-08-24 DIAGNOSIS — Z23 Encounter for immunization: Secondary | ICD-10-CM | POA: Diagnosis not present

## 2020-08-24 DIAGNOSIS — Z113 Encounter for screening for infections with a predominantly sexual mode of transmission: Secondary | ICD-10-CM | POA: Diagnosis not present

## 2020-08-24 DIAGNOSIS — R700 Elevated erythrocyte sedimentation rate: Secondary | ICD-10-CM

## 2020-08-24 DIAGNOSIS — G43809 Other migraine, not intractable, without status migrainosus: Secondary | ICD-10-CM | POA: Diagnosis not present

## 2020-08-24 DIAGNOSIS — Z0000 Encounter for general adult medical examination without abnormal findings: Secondary | ICD-10-CM | POA: Diagnosis not present

## 2020-08-24 DIAGNOSIS — R519 Headache, unspecified: Secondary | ICD-10-CM

## 2020-08-24 DIAGNOSIS — J3489 Other specified disorders of nose and nasal sinuses: Secondary | ICD-10-CM

## 2020-08-24 DIAGNOSIS — Z1211 Encounter for screening for malignant neoplasm of colon: Secondary | ICD-10-CM

## 2020-08-24 DIAGNOSIS — Z124 Encounter for screening for malignant neoplasm of cervix: Secondary | ICD-10-CM

## 2020-08-24 LAB — COMPREHENSIVE METABOLIC PANEL
ALT: 30 U/L (ref 0–35)
AST: 22 U/L (ref 0–37)
Albumin: 4.5 g/dL (ref 3.5–5.2)
Alkaline Phosphatase: 111 U/L (ref 39–117)
BUN: 19 mg/dL (ref 6–23)
CO2: 27 mEq/L (ref 19–32)
Calcium: 9.3 mg/dL (ref 8.4–10.5)
Chloride: 104 mEq/L (ref 96–112)
Creatinine, Ser: 0.94 mg/dL (ref 0.40–1.20)
GFR: 68.23 mL/min (ref >60.00)
Glucose, Bld: 193 mg/dL — ABNORMAL HIGH (ref 70–99)
Potassium: 3.9 mEq/L (ref 3.5–5.1)
Sodium: 140 mEq/L (ref 135–145)
Total Bilirubin: 0.5 mg/dL (ref 0.2–1.2)
Total Protein: 7.9 g/dL (ref 6.0–8.3)

## 2020-08-24 LAB — CBC WITH DIFFERENTIAL/PLATELET
Basophils Absolute: 0 10*3/uL (ref 0.0–0.1)
Basophils Relative: 1.1 % (ref 0.0–3.0)
Eosinophils Absolute: 0.3 10*3/uL (ref 0.0–0.7)
Eosinophils Relative: 6.5 % — ABNORMAL HIGH (ref 0.0–5.0)
HCT: 41.8 % (ref 36.0–46.0)
Hemoglobin: 13.7 g/dL (ref 12.0–15.0)
Lymphocytes Relative: 37.4 % (ref 12.0–46.0)
Lymphs Abs: 1.7 10*3/uL (ref 0.7–4.0)
MCHC: 32.9 g/dL (ref 30.0–36.0)
MCV: 90.4 fl (ref 78.0–100.0)
Monocytes Absolute: 0.3 10*3/uL (ref 0.1–1.0)
Monocytes Relative: 7.7 % (ref 3.0–12.0)
Neutro Abs: 2.1 10*3/uL (ref 1.4–7.7)
Neutrophils Relative %: 47.3 % (ref 43.0–77.0)
Platelets: 262 10*3/uL (ref 150.0–400.0)
RBC: 4.62 Mil/uL (ref 3.87–5.11)
RDW: 15 % (ref 11.5–15.5)
WBC: 4.4 10*3/uL (ref 4.0–10.5)

## 2020-08-24 LAB — HEMOGLOBIN A1C: Hgb A1c MFr Bld: 7.9 % — ABNORMAL HIGH (ref 4.6–6.5)

## 2020-08-24 LAB — LIPID PANEL
Cholesterol: 204 mg/dL — ABNORMAL HIGH (ref 0–200)
HDL: 64.9 mg/dL (ref >39.00)
LDL Cholesterol: 125 mg/dL — ABNORMAL HIGH (ref 0–99)
NonHDL: 138.79
Total CHOL/HDL Ratio: 3
Triglycerides: 67 mg/dL (ref 0.0–149.0)
VLDL: 13.4 mg/dL (ref 0.0–40.0)

## 2020-08-24 LAB — IRON: Iron: 84 ug/dL (ref 42–145)

## 2020-08-24 LAB — T4, FREE: Free T4: 0.8 ng/dL (ref 0.60–1.60)

## 2020-08-24 LAB — TSH: TSH: 1.13 u[IU]/mL (ref 0.35–5.50)

## 2020-08-24 LAB — SEDIMENTATION RATE: Sed Rate: 41 mm/hr — ABNORMAL HIGH (ref 0–30)

## 2020-08-24 MED ORDER — GABAPENTIN 300 MG PO CAPS: 600.0000 mg | ORAL_CAPSULE | Freq: Three times a day (TID) | ORAL | 0 refills | Status: DC

## 2020-08-24 MED ORDER — METHOCARBAMOL 750 MG PO TABS: ORAL_TABLET | ORAL | 0 refills | Status: DC

## 2020-08-24 MED ORDER — ATORVASTATIN CALCIUM 80 MG PO TABS: 80.0000 mg | ORAL_TABLET | Freq: Every day | ORAL | 11 refills | Status: DC

## 2020-08-24 MED ORDER — PREDNISONE 10 MG (21) PO TBPK: ORAL_TABLET | ORAL | 0 refills | Status: DC

## 2020-08-24 MED ORDER — AZITHROMYCIN 250 MG PO TABS: ORAL_TABLET | ORAL | 0 refills | Status: AC

## 2020-08-24 MED ORDER — KETOROLAC TROMETHAMINE 60 MG/2ML IM SOLN
60.0000 mg | Freq: Once | INTRAMUSCULAR | Status: AC
  Administered 2020-08-24: 60 mg via INTRAMUSCULAR

## 2020-08-24 NOTE — Addendum Note (Signed)
Addended by: Anabel Halon on: 08/24/2020 10:21 PM   Modules accepted: Orders

## 2020-08-24 NOTE — Addendum Note (Signed)
Addended by: Anabel Halon on: 08/24/2020 06:06 PM   Modules accepted: Orders

## 2020-08-24 NOTE — Patient Instructions (Addendum)
For you wellness exam today I have ordered cbc, cmp, lipid panel and hiv.  Vaccine tdap and pcv 20.  Recommend exercise and healthy diet.  We will let you know lab results as they come in.  Follow up date appointment will be determined after lab review.    For recent ha and possible residual migraine toradol 60 mg im.  If toradol does not resolve ha/frontal sinus area pain then start azithromycin.  Add sed rate to labs since some temporal pain left side.  If ha not resolving with above then notify me will send neurologist a note if can see for follow up. If any gross motor or sensory deficit with ha then ED evaluation.  For hx of diabetes get a1c today.  Refer back to gi and gyn for screening.(Gi colonscopy, gyn pap)  For fatigue added tsh and t4.  Glad to see 10 lb wt gain.  Preventive Care 14-30 Years Old, Female Preventive care refers to lifestyle choices and visits with your health care provider that can promote health and wellness. This includes: A yearly physical exam. This is also called an annual wellness visit. Regular dental and eye exams. Immunizations. Screening for certain conditions. Healthy lifestyle choices, such as: Eating a healthy diet. Getting regular exercise. Not using drugs or products that contain nicotine and tobacco. Limiting alcohol use. What can I expect for my preventive care visit? Physical exam Your health care provider will check your: Height and weight. These may be used to calculate your BMI (body mass index). BMI is a measurement that tells if you are at a healthy weight. Heart rate and blood pressure. Body temperature. Skin for abnormal spots. Counseling Your health care provider may ask you questions about your: Past medical problems. Family's medical history. Alcohol, tobacco, and drug use. Emotional well-being. Home life and relationship well-being. Sexual activity. Diet, exercise, and sleep habits. Work and work  Statistician. Access to firearms. Method of birth control. Menstrual cycle. Pregnancy history. What immunizations do I need?  Vaccines are usually given at various ages, according to a schedule. Your health care provider will recommend vaccines for you based on your age, medicalhistory, and lifestyle or other factors, such as travel or where you work. What tests do I need? Blood tests Lipid and cholesterol levels. These may be checked every 5 years, or more often if you are over 74 years old. Hepatitis C test. Hepatitis B test. Screening Lung cancer screening. You may have this screening every year starting at age 56 if you have a 30-pack-year history of smoking and currently smoke or have quit within the past 15 years. Colorectal cancer screening. All adults should have this screening starting at age 30 and continuing until age 34. Your health care provider may recommend screening at age 79 if you are at increased risk. You will have tests every 1-10 years, depending on your results and the type of screening test. Diabetes screening. This is done by checking your blood sugar (glucose) after you have not eaten for a while (fasting). You may have this done every 1-3 years. Mammogram. This may be done every 1-2 years. Talk with your health care provider about when you should start having regular mammograms. This may depend on whether you have a family history of breast cancer. BRCA-related cancer screening. This may be done if you have a family history of breast, ovarian, tubal, or peritoneal cancers. Pelvic exam and Pap test. This may be done every 3 years starting at age 56.  Starting at age 81, this may be done every 5 years if you have a Pap test in combination with an HPV test. Other tests STD (sexually transmitted disease) testing, if you are at risk. Bone density scan. This is done to screen for osteoporosis. You may have this scan if you are at high risk for osteoporosis. Talk  with your health care provider about your test results, treatment options,and if necessary, the need for more tests. Follow these instructions at home: Eating and drinking  Eat a diet that includes fresh fruits and vegetables, whole grains, lean protein, and low-fat dairy products. Take vitamin and mineral supplements as recommended by your health care provider. Do not drink alcohol if: Your health care provider tells you not to drink. You are pregnant, may be pregnant, or are planning to become pregnant. If you drink alcohol: Limit how much you have to 0-1 drink a day. Be aware of how much alcohol is in your drink. In the [place] one drink equals one 12 oz bottle of beer (355 mL), one 5 oz glass of wine (148 mL), or one 1 oz glass of hard liquor (44 mL).  Lifestyle Take daily care of your teeth and gums. Brush your teeth every morning and night with fluoride toothpaste. Floss one time each day. Stay active. Exercise for at least 30 minutes 5 or more days each week. Do not use any products that contain nicotine or tobacco, such as cigarettes, e-cigarettes, and chewing tobacco. If you need help quitting, ask your health care provider. Do not use drugs. If you are sexually active, practice safe sex. Use a condom or other form of protection to prevent STIs (sexually transmitted infections). If you do not wish to become pregnant, use a form of birth control. If you plan to become pregnant, see your health care provider for a prepregnancy visit. If told by your health care provider, take low-dose aspirin daily starting at age 1. Find healthy ways to cope with stress, such as: Meditation, yoga, or listening to music. Journaling. Talking to a trusted person. Spending time with friends and family. Safety Always wear your seat belt while driving or riding in a vehicle. Do not drive: If you have been drinking alcohol. Do not ride with someone who has been drinking. When you are tired or  distracted. While texting. Wear a helmet and other protective equipment during sports activities. If you have firearms in your house, make sure you follow all gun safety procedures. What's next? Visit your health care provider once a year for an annual wellness visit. Ask your health care provider how often you should have your eyes and teeth checked. Stay up to date on all vaccines. This information is not intended to replace advice given to you by your health care provider. Make sure you discuss any questions you have with your healthcare provider. Document Revised: 11/01/2019 Document Reviewed: 10/08/2017 Elsevier Patient Education  2022 [employer].

## 2020-08-24 NOTE — Progress Notes (Addendum)
Subjective:    Patient ID: [name], female    DOB: 04/09/1965, 55 y.o.   MRN: [id number]  HPI Pt in for cpe/wellness exam. She is fasting.  On review can get pcv 20. Pt is diabetic  Pt not up to date on tdap. Will get today.  Pt thinks had shingrix in past. 1 vaccine but needs second. She will check with her pharmacy which she thinks gave.  Pt had mammogram done last year. 12-28-2019.   Pt not up to date on colonoscopy. I had referred her but she did not get done. Pt had hx of blood in her stool. Pt saw Dr. Edilia Bo and by time seen by her the blood in stool stopped.   Looks like she was referred twice in epic.(5/01-2021 note in epic "Pt canceled appt and did not reschedule, will close referral"  Pt states most recently went to [place].  Pt states she had 2 covid vaccine follwed by booster.   Pt weight has increased over past 2 months. In past had some weight loss.  In march gyn referral placed. Pt canceled that appointment.  Mild fatigue recently.  Lt side headache since Monday. Pt has hx of left side headache in the past. Pt has seen neurologist for this. Mri showed below.    IMPRESSION: 1. Mild to moderate for age cerebral white matter signal changes which are predominantly subcortical and nonspecific. 2. Otherwise negative MRI appearance of the brain.   Neurologist recommendations day of service in march.  "MIGRAINE WITH AURA   MIGRAINE PREVENTION LIFESTYLE CHANGES -Stop or avoid smoking -Decrease or avoid caffeine / alcohol -Eat and sleep on a regular schedule -Exercise several times per week - start topiramate 50mg  at bedtime; after 1-2 weeks increase to 50mg  twice a day; drink plenty of water   MIGRAINE RESCUE - ibuprofen, tylenol as needed - avoid triptans (? history of CAD) - start rimegepant (Nurtec) 75mg  as needed for breakthrough headache; max 8 per month   STRESS / ANXIETY / PARANOIA  - apparently had fraudulent activity on her  bank card, amaz[employer] account, instacart account; she working with her bank and credit card to recover funds - patient is paranoid that people have installed cameras in her apartment and have been recording her; she does not have any proof of this; now has abandoned her apartment and has been living at various friend's houses, sleeping on their sofas (?) - she is planning to move back to [facility] for a "clean start" in about 1-2 months"  Pt states nurtec seems to not be helping recently but early on did help. Last headache was yesterday. Recent ha daily despite neurologist tx.  Pt thinks maybe has sinus infection. She has been little nasal congested.  Frontal sinus pain today mild. Headache today is minimal compared to other days.     Review of Systems  Constitutional:  Positive for fatigue. Negative for chills and fever.  HENT:  Negative for congestion.   Respiratory:  Negative for cough, chest tightness, shortness of breath and wheezing.   Cardiovascular:  Negative for chest pain and palpitations.  Gastrointestinal:  Negative for abdominal pain, blood in stool, constipation, diarrhea, nausea and vomiting.  Genitourinary:  Negative for dysuria, flank pain, frequency, menstrual problem and urgency.  Musculoskeletal:  Positive for back pain.       Chronic.  Skin:  Negative for rash.  Psychiatric/Behavioral:  Negative for behavioral problems and confusion.     Past Medical History:  Diagnosis  Date   ABDOMINAL PAIN-EPIGASTRIC 09/14/2009   Qualifier: Diagnosis of  By: Shane Crutch, [name]    Acute cystitis 07/07/2017   ANKLE PAIN, LEFT 12/25/2009   Qualifier: Diagnosis of  By: Amil Amen MD, [name]     Anxiety 01/22/2016   CAD (coronary artery disease) 07/07/2017   Cardiac murmur 02/01/2019   Cardiomyopathy, secondary ([place]) 09/06/2009   Qualifier: Diagnosis of  By: Arvid Right   Formatting of this note might be different from the original. Overview:  Qualifier: Diagnosis of  By:  Arvid Right   Cardiomyopathy, unspecified ([place]) 09/06/2009   Formatting of this note might be different from the original. Overview:  Overview:  Qualifier: Diagnosis of  By: Arvid Right  Overview:  Overview:  Qualifier: Diagnosis of  By: Arvid Right Formatting of this note might be different from the original. Overview:  Qualifier: Diagnosis of  By: Arvid Right   Cerebrovascular disease 09/13/2012   Cervical cancer ([place])    had surgery in 2001.   CHEST PAIN, ATYPICAL 07/30/2009   Qualifier: Diagnosis of  By: Amil Amen MD, Winona Legato of this note might be different from the original. Overview:  Qualifier: Diagnosis of  By: Amil Amen MD, [name]   Chronic back pain    Chronic kidney disease, stage III (moderate) ([place]) 05/23/2014   Chronic pain syndrome 01/23/2016   Coronary artery disease    30% lesions noted   Current use of insulin ([place]) 05/23/2014   Depression    Diabetes mellitus    DIABETES MELLITUS, TYPE II, UNCONTROLLED 07/30/2009   Qualifier: Diagnosis of  By: Amil Amen MD, [name]     Diabetes type 2, uncontrolled ([place]) 05/23/2014   Diabetic gastroparesis associated with type 1 diabetes mellitus ([place]) 11/27/2016   DIABETIC PERIPHERAL NEUROPATHY 09/21/2009   Qualifier: Diagnosis of  By: Amil Amen MD, [name]     Diabetic peripheral neuropathy ([place]) 01/23/2016   Dysuria 12/25/2009   Qualifier: Diagnosis of  By: Amil Amen MD, [name]     Elevation of level of transaminase or lactic acid dehydrogenase (LDH) 09/21/2009   Formatting of this note might be different from the original. Overview:  Qualifier: Diagnosis of  By: Amil Amen MD, [place]   Essential hypertension 08/30/2009   Qualifier: Diagnosis of  By: Lovette Cliche, CNA, Christy     Gastric atony 07/30/2009   Formatting of this note might be different from the original. Overview:  Qualifier: Diagnosis of  By: Amil Amen MD, [place]   Gastroesophageal reflux  disease 07/30/2009   Formatting of this note might be different from the original. Overview:  Overview:  Qualifier: Diagnosis of  By: Amil Amen MD, Elby Showers: Diagnosis of  By: [place] Holstein NP, Ellwood Handler of this note might be different from the original. Overview:  Qualifier: Diagnosis of  By: Amil Amen MD, Elby Showers: Diagnosis of  By: [place] Holstein NP, Nevin Bloodgood   Gastroparesis 07/30/2009   Qualifier: Diagnosis of  By: Amil Amen MD, [name]     GERD 07/30/2009   Qualifier: Diagnosis of  By: Amil Amen MD, Elby Showers: Diagnosis of  By: [place] Holstein NP, Nevin Bloodgood     GERD (gastroesophageal reflux disease)    History of cervical cancer 08/17/2015   Status post partial hysterectomy  Formatting of this note might be different from the original. Status post partial hysterectomy   HLD (hyperlipidemia) 07/30/2009   Qualifier: Diagnosis of  By: Amil Amen MD, Benjamine Mola     Hyperlipidemia  Hyperlipidemia, unspecified 07/30/2009   Formatting of this note might be different from the original. Overview:  Qualifier: Diagnosis of  By: Amil Amen MD, Winona Legato of this note might be different from the original. Overview:  Qualifier: Diagnosis of  By: Amil Amen MD, [place]   Hypertension    INSOMNIA 09/21/2009   Qualifier: Diagnosis of  By: Amil Amen MD, [name]     Lumbar facet arthropathy 05/14/2016   Lumbar spinal stenosis 02/11/2018   Metatarsalgia of both feet 03/11/2017   Migraine 09/13/2012   Overview:  IMPRESSION: possible abd migraine   Mixed dyslipidemia 02/01/2019   Nausea & vomiting 07/07/2017   Nausea with vomiting, unspecified 09/14/2009   Qualifier: Diagnosis of  By: Shane Crutch, [name]   Formatting of this note might be different from the original. Overview:  Qualifier: Diagnosis of  By: Shane Crutch, [name]   Neuropathy    Nonspecific abnormal findings on radiological and examination of skull and head 09/13/2012   Overweight (BMI 25.0-29.9) 07/21/2014   POSITIVE PPD 07/30/2009    Annotation: CXR clear ?  diagnosed in 2/11?  On INH/pyridoxine Qualifier: Diagnosis of  By: Amil Amen MD, [name]     Pure hypercholesterolemia 11/27/2016   Retention of urine 06/25/2011   Right flank pain    TOBACCO ABUSE 09/21/2009   Qualifier: Diagnosis of  By: Amil Amen MD, Winona Legato of this note might be different from the original. Overview:  Qualifier: Diagnosis of  By: Amil Amen MD, Elinor Parkinson, SERUM, ELEVATED 09/21/2009   Qualifier: Diagnosis of  By: Amil Amen MD, [name]     Trigeminal neuralgia    Tuberculin test reaction 07/30/2009   Formatting of this note might be different from the original. Overview:  Annotation: CXR clear ?  diagnosed in 2/11?  On INH/pyridoxine Qualifier: Diagnosis of  By: Amil Amen MD, [name]   Type 2 diabetes mellitus with hyperglycemia ([place]) 07/30/2009   Qualifier: Diagnosis of  By: Amil Amen MD, Winona Legato of this note might be different from the original. Overview:  Qualifier: Diagnosis of  By: Amil Amen MD, [name]   Type 2 diabetes mellitus with hyperglycemia, with long-term current use of insulin ([place]) 07/30/2009   Formatting of this note might be different from the original. Overview:  05/30/2015 A1C was 16.7% (!)  Overview:  05/30/2015 A1C was 16.7% (!) Formatting of this note might be different from the original. 05/30/2015 A1C was 16.7% (!)   Type 2 diabetes mellitus with stage 3 chronic kidney disease ([place]) 09/14/2009   Qualifier: Diagnosis of  By: Shane Crutch, [name]    ULCER-GASTRIC 09/28/2009   Qualifier: Diagnosis of  By: [place] Holstein NP, Paula     URINARY TRACT INFECTION 09/21/2009   Qualifier: Diagnosis of  By: Amil Amen MD, Louanne Belton, CANDIDAL 12/25/2009   Qualifier: Diagnosis of  By: Amil Amen MD, Tessa Lerner D deficiency 04/25/2013     Social History   Socioeconomic History   Marital status: Single    Spouse name: Not on file   Number of children: Not on file   Years of  education: Not on file   Highest education level: Not on file  Occupational History   Not on file  Tobacco Use   Smoking status: Never   Smokeless tobacco: Never  Vaping Use   Vaping Use: Never used  Substance and Sexual Activity   Alcohol use: Yes    Comment: occ   Drug use: No  Sexual activity: Not on file  Other Topics Concern   Not on file  Social History Narrative   Not on file   Social Determinants of Health   Financial Resource Strain: Not on file  Food Insecurity: Not on file  Transportation Needs: Not on file  Physical Activity: Not on file  Stress: Not on file  Social Connections: Not on file  Intimate Partner Violence: Not on file    Past Surgical History:  Procedure Laterality Date   ABDOMINAL HYSTERECTOMY     partial   CHOLECYSTECTOMY      Family History  Problem Relation Age of Onset   Diabetes Mother    Diabetes Father     Allergies  Allergen Reactions   Insulin Aspart Other (See Comments) and Swelling    adverse reaction to Novolog    Latex Itching and Rash   Morphine Itching and Rash    Current Outpatient Medications on File Prior to Visit  Medication Sig Dispense Refill   albuterol (VENTOLIN HFA) 108 (90 Base) MCG/ACT inhaler Inhale 2 puffs into the lungs every 6 (six) hours as needed for wheezing or shortness of breath. 18 g 2   amLODipine (NORVASC) 5 MG tablet Take 1 tablet by mouth once daily 30 tablet 0   aspirin 81 MG tablet Take 81 mg by mouth daily.     atorvastatin (LIPITOR) 80 MG tablet Take 1 tablet by mouth once daily 30 tablet 0   atorvastatin (LIPITOR) 80 MG tablet Take 1 tablet by mouth once daily 60 tablet 0   azelastine (ASTELIN) 0.1 % nasal spray Place 2 sprays into both nostrils 2 (two) times daily. 30 mL 1   busPIRone (BUSPAR) 15 MG tablet Take 1 tablet (15 mg total) by mouth 3 (three) times daily. 21 tablet 0   busPIRone (BUSPAR) 15 MG tablet Take 1 tablet (15 mg total) by mouth 2 (two) times daily. 90 tablet 3    busPIRone (BUSPAR) 15 MG tablet TAKE 1 TABLET BY MOUTH THREE TIMES DAILY 21 tablet 0   carvedilol (COREG) 25 MG tablet Take 1 tablet (25 mg total) by mouth 2 (two) times daily with a meal. 180 tablet 0   cephALEXin (KEFLEX) 500 MG capsule Take 1 capsule (500 mg total) by mouth 2 (two) times daily. 14 capsule 0   Cholecalciferol (VITAMIN D3) 400 units CAPS Take 400 mg by mouth daily.     FARXIGA 10 MG TABS tablet Take 1 tablet by mouth once daily 90 tablet 0   fluticasone (FLONASE) 50 MCG/ACT nasal spray Place 2 sprays into both nostrils daily. 16 g 1   fluticasone furoate-vilanterol (BREO ELLIPTA) 100-25 MCG/INH AEPB Inhale 1 puff into the lungs daily. 100 each 2   gabapentin (NEURONTIN) 300 MG capsule Take 600 mg by mouth 3 (three) times daily.     insulin degludec (TRESIBA) 100 UNIT/ML FlexTouch Pen Inject 40 Units into the skin daily at 10 pm. 100 mL 2   meloxicam (MOBIC) 7.5 MG tablet Take 1-2 tablets (7.5-15 mg total) by mouth daily. 60 tablet 1   metFORMIN (GLUCOPHAGE-XR) 500 MG 24 hr tablet Take 500 mg by mouth daily.     methocarbamol (ROBAXIN) 750 MG tablet Take 750 mg by mouth every 8 (eight) hours as needed for muscle spasms.     methocarbamol (ROBAXIN-750) 750 MG tablet 1 tab po q day prn lower back pain/spasms 30 tablet 0   nitroGLYCERIN (NITROSTAT) 0.4 MG SL tablet Place 0.4 mg under the tongue every  5 (five) minutes as needed for chest pain.     ondansetron (ZOFRAN ODT) 4 MG disintegrating tablet Take 1 tablet (4 mg total) by mouth every 8 (eight) hours as needed for nausea or vomiting. 10 tablet 0   oxycodone (OXY-IR) 5 MG capsule 2 tab po bid prn severe pain 20 capsule 0   promethazine (PHENERGAN) 12.5 MG tablet Take 12.5 mg by mouth every 8 (eight) hours as needed for nausea/vomiting.     promethazine (PHENERGAN) 12.5 MG tablet Take 1 tablet (12.5 mg total) by mouth every 8 (eight) hours as needed for nausea or vomiting. 8 tablet 0   rizatriptan (MAXALT-MLT) 10 MG disintegrating  tablet Take 10 mg by mouth daily as needed for migraine.     sertraline (ZOLOFT) 100 MG tablet Take 1 tablet (100 mg total) by mouth daily. 30 tablet 3   spironolactone (ALDACTONE) 25 MG tablet Take 1 tablet by mouth once daily 30 tablet 0   topiramate (TOPAMAX) 50 MG tablet Take 1 tablet (50 mg total) by mouth 2 (two) times daily. 60 tablet 12   No current facility-administered medications on file prior to visit.    BP (!) 137/97   Pulse 88   Temp 98.1 F (36.7 C)   Resp 18   Ht 6' (1.829 m)   Wt 170 lb (77.1 kg)   SpO2 100%   BMI 23.06 kg/m       Objective:   Physical Exam  General Mental Status- Alert. General Appearance- Not in acute distress.   Skin General: Color- Normal Color. Moisture- Normal Moisture.  Neck Carotid Arteries- Normal color. Moisture- Normal Moisture. No carotid bruits. No JVD.  Chest and Lung Exam Auscultation: Breath Sounds:-Normal.  Cardiovascular Auscultation:Rythm- Regular. Murmurs & Other Heart Sounds:Auscultation of the heart reveals- No Murmurs.  Abdomen Inspection:-Inspeection Normal. Palpation/Percussion:Note:No mass. Palpation and Percussion of the abdomen reveal- Non Tender, Non Distended + BS, no rebound or guarding.    Neurologic Cranial Nerve exam:- CN III-XII intact(No nystagmus), symmetric smile. Drift Test:- No drift. Romberg Exam:- Negative.  Heal to Toe Gait exam:-Normal. Finger to Nose:- Normal/Intact Strength:- 5/5 equal and symmetric strength both upper and lower extremities.   Heent- left frontal sinus tenderness to palpation. Mid left temporal area tenderness to palpation.     Assessment & Plan:   For you wellness exam today I have ordered cbc, cmp, lipid panel and hiv.  Vaccine tdap and pcv 20.  Recommend exercise and healthy diet.  We will let you know lab results as they come in.  Follow up date appointment will be determined after lab review.    For recent ha and possible residual migraine toradol  60 mg im.  If toradol does not resolve ha/frontal sinus area pain then start azithromycin.  Add sed rate to labs since some temporal pain left side.  If ha not resolving with above then notify me will send neurologist a note if can see for follow up. If any gross motor or sensory deficit with ha then ED evaluation.  For hx of diabetes get a1c today.  Refer back to gi and gyn for screening.(Gi colonscopy, gyn pap)  For fatigue added tsh and t4.  Glad to see 10 lb wt gain.  Mackie Pai, [place]   [id number] charge as addressed fatigue, diaetes and discussed/treated HA/sinus pressure. Most of additional time spent on ha.

## 2020-08-24 NOTE — Telephone Encounter (Signed)
Patient is requesting refills on :  Buspar Lipitor  Methocarbamol Gabapentin     But these medications have 0 refills left , is it okay to send in to patient's pharmacy

## 2020-08-27 LAB — HIV ANTIBODY (ROUTINE TESTING W REFLEX): HIV 1&2 Ab, 4th Generation: NONREACTIVE

## 2020-08-27 MED ORDER — BUSPIRONE HCL 15 MG PO TABS: 15.0000 mg | ORAL_TABLET | Freq: Three times a day (TID) | ORAL | 0 refills | Status: DC

## 2020-08-27 NOTE — Telephone Encounter (Signed)
Spoke with pharmacy stated she has not filled script since December , she didn't pick up her script this year , will send in buspar

## 2020-08-27 NOTE — Addendum Note (Signed)
Addended by: Jeronimo Greaves on: 08/27/2020 11:43 AM   Modules accepted: Orders

## 2020-08-29 ENCOUNTER — Telehealth: Payer: Self-pay

## 2020-08-29 NOTE — Telephone Encounter (Signed)
Forms faxed into front office Placed into saguiers bin

## 2020-08-29 NOTE — Telephone Encounter (Signed)
Caller wanted to get the office fax number and to let Mackie Pai know that her job will be sending a fax for a release return to work and could he please fax it back to them when he completes it.  Telephone: [phone]

## 2020-08-30 ENCOUNTER — Other Ambulatory Visit: Payer: Self-pay | Admitting: Medical

## 2020-09-03 NOTE — Telephone Encounter (Signed)
Called pt to schedule 40 min appt , lvm to return call

## 2020-09-04 NOTE — Telephone Encounter (Signed)
Called pt and no answer.  

## 2020-09-10 ENCOUNTER — Ambulatory Visit (INDEPENDENT_AMBULATORY_CARE_PROVIDER_SITE_OTHER): Payer: 59 | Admitting: Medical

## 2020-09-10 ENCOUNTER — Other Ambulatory Visit: Payer: Self-pay

## 2020-09-10 ENCOUNTER — Telehealth: Payer: Self-pay | Admitting: Medical

## 2020-09-10 ENCOUNTER — Ambulatory Visit: Payer: 59 | Admitting: Medical

## 2020-09-10 VITALS — BP 147/100 | HR 87 | Resp 18 | Ht 72.0 in | Wt 172.4 lb

## 2020-09-10 DIAGNOSIS — G43809 Other migraine, not intractable, without status migrainosus: Secondary | ICD-10-CM

## 2020-09-10 DIAGNOSIS — R700 Elevated erythrocyte sedimentation rate: Secondary | ICD-10-CM

## 2020-09-10 DIAGNOSIS — I10 Essential (primary) hypertension: Secondary | ICD-10-CM

## 2020-09-10 MED ORDER — AMLODIPINE BESYLATE 10 MG PO TABS: 10.0000 mg | ORAL_TABLET | Freq: Every day | ORAL | 2 refills | Status: DC

## 2020-09-10 NOTE — Telephone Encounter (Signed)
Dr. Leta Baptist,  [facility] patient is going to see you at end of this month. She is still having ha. She tells me she never got prescription for nurtec. She indicates was not aware she was to take. Wanted to let you know in event you wanted to resend.    Thanks for your help with our patients.  Mackie Pai, PA-C

## 2020-09-10 NOTE — Progress Notes (Signed)
Subjective:    Patient ID: [name], female    DOB: 1965-08-29, 55 y.o.   MRN: [id number]  HPI   Pt in for follow up.  Pt states one of her job let her go.  Pt states/thinks I don't need to fill out metlife form. She thinks that paperwork associated with job that let her go.  He new job is requesting that I fill out fmla paperwork. Pt been at that job for about a month. Pt missed work from July 15th- July 24th. She returned on 25 th.   Pt states she was having headaches during that period of time. Pt will see neurologist on 10-07-2020.  I had written prednisone since her sed rate was elevated. Pt states her ha is less now but still comes and goes. Ha still on left side and pain level often 4/10.   Prior mri of head showed.  IMPRESSION: 1. Mild to moderate for age cerebral white matter signal changes which are predominantly subcortical and nonspecific. 2. Otherwise negative MRI appearance of the brain.   DX/day of last visit with neurologist read.(Pt started topiramate. She never got nurtec)  PLAN:   NEW ONSET PAIN / HEADACHE / ANXIETY - check MRI brain     MIGRAINE WITH AURA   MIGRAINE PREVENTION LIFESTYLE CHANGES -Stop or avoid smoking -Decrease or avoid caffeine / alcohol -Eat and sleep on a regular schedule -Exercise several times per week - start topiramate '50mg'$  at bedtime; after 1-2 weeks increase to '50mg'$  twice a day; drink plenty of water   MIGRAINE RESCUE - ibuprofen, tylenol as needed - avoid triptans (? history of CAD) - start rimegepant (Nurtec) '75mg'$  as needed for breakthrough headache; max 8 per month   STRESS / ANXIETY / PARANOIA  - apparently had fraudulent activity on her bank card, amaz[employer] account, inst[employer] account; she working with her bank and credit card to recover funds - patient is paranoid that people have installed cameras in her apartment and have been recording her; she does not have any proof of this; now has abandoned her  apartment and has been living at various friend's houses, sleeping on their sofas (?) - she is planning to move back to [facility] for a "clean start" in about 1-2 months   Pt bp is elevated today.    Review of Systems  Constitutional:  Negative for chills, fatigue and fever.  Respiratory:  Negative for cough, chest tightness, shortness of breath and wheezing.   Cardiovascular:  Negative for chest pain and palpitations.  Gastrointestinal:  Negative for abdominal pain.  Musculoskeletal:  Negative for back pain.  Neurological:  Positive for headaches. Negative for dizziness, syncope, speech difficulty and weakness.  Hematological:  Negative for adenopathy. Does not bruise/bleed easily.    Past Medical History:  Diagnosis Date   ABDOMINAL PAIN-EPIGASTRIC 09/14/2009   Qualifier: Diagnosis of  By: Trellis Paganini PA-c, [name]    Acute cystitis 07/07/2017   ANKLE PAIN, LEFT 12/25/2009   Qualifier: Diagnosis of  By: Amil Amen MD, [name]     Anxiety 01/22/2016   CAD (coronary artery disease) 07/07/2017   Cardiac murmur 02/01/2019   Cardiomyopathy, secondary ([place]) 09/06/2009   Qualifier: Diagnosis of  By: Arvid Right   Formatting of this note might be different from the original. Overview:  Qualifier: Diagnosis of  By: Arvid Right   Cardiomyopathy, unspecified ([place]) 09/06/2009   Formatting of this note might be different from the original. Overview:  Overview:  Qualifier: Diagnosis of  By: Arvid Right  Overview:  Overview:  Qualifier: Diagnosis of  By: Arvid Right Formatting of this note might be different from the original. Overview:  Qualifier: Diagnosis of  By: Arvid Right   Cerebrovascular disease 09/13/2012   Cervical cancer ([place])    had surgery in 2001.   CHEST PAIN, ATYPICAL 07/30/2009   Qualifier: Diagnosis of  By: Amil Amen MD, Winona Legato of this note might be different from the original. Overview:   Qualifier: Diagnosis of  By: Amil Amen MD, [name]   Chronic back pain    Chronic kidney disease, stage III (moderate) ([place]) 05/23/2014   Chronic pain syndrome 01/23/2016   Coronary artery disease    30% lesions noted   Current use of insulin ([place]) 05/23/2014   Depression    Diabetes mellitus    DIABETES MELLITUS, TYPE II, UNCONTROLLED 07/30/2009   Qualifier: Diagnosis of  By: Amil Amen MD, [name]     Diabetes type 2, uncontrolled ([place]) 05/23/2014   Diabetic gastroparesis associated with type 1 diabetes mellitus ([place]) 11/27/2016   DIABETIC PERIPHERAL NEUROPATHY 09/21/2009   Qualifier: Diagnosis of  By: Amil Amen MD, [name]     Diabetic peripheral neuropathy ([place]) 01/23/2016   Dysuria 12/25/2009   Qualifier: Diagnosis of  By: Amil Amen MD, [name]     Elevation of level of transaminase or lactic acid dehydrogenase (LDH) 09/21/2009   Formatting of this note might be different from the original. Overview:  Qualifier: Diagnosis of  By: Amil Amen MD, [place]   Essential hypertension 08/30/2009   Qualifier: Diagnosis of  By: Lovette Cliche, CNA, Christy     Gastric atony 07/30/2009   Formatting of this note might be different from the original. Overview:  Qualifier: Diagnosis of  By: Amil Amen MD, [place]   Gastroesophageal reflux disease 07/30/2009   Formatting of this note might be different from the original. Overview:  Overview:  Qualifier: Diagnosis of  By: Amil Amen MD, Elby Showers: Diagnosis of  By: Chester Holstein NP, Ellwood Handler of this note might be different from the original. Overview:  Qualifier: Diagnosis of  By: Amil Amen MD, Elby Showers: Diagnosis of  By: Chester Holstein NP, Nevin Bloodgood   Gastroparesis 07/30/2009   Qualifier: Diagnosis of  By: Amil Amen MD, [name]     GERD 07/30/2009   Qualifier: Diagnosis of  By: Amil Amen MD, Elby Showers: Diagnosis of  By: Chester Holstein NP, Nevin Bloodgood     GERD (gastroesophageal reflux disease)    History of cervical cancer 08/17/2015   Status post  partial hysterectomy  Formatting of this note might be different from the original. Status post partial hysterectomy   HLD (hyperlipidemia) 07/30/2009   Qualifier: Diagnosis of  By: Amil Amen MD, [name]     Hyperlipidemia    Hyperlipidemia, unspecified 07/30/2009   Formatting of this note might be different from the original. Overview:  Qualifier: Diagnosis of  By: Amil Amen MD, Winona Legato of this note might be different from the original. Overview:  Qualifier: Diagnosis of  By: Amil Amen MD, [name]   Hypertension    INSOMNIA 09/21/2009   Qualifier: Diagnosis of  By: Amil Amen MD, [name]     Lumbar facet arthropathy 05/14/2016   Lumbar spinal stenosis 02/11/2018   Metatarsalgia of both feet 03/11/2017   Migraine 09/13/2012   Overview:  IMPRESSION: possible abd migraine   Mixed dyslipidemia 02/01/2019   Nausea & vomiting 07/07/2017   Nausea with vomiting, unspecified 09/14/2009   Qualifier: Diagnosis  of  By: Shane Crutch, [name]   Formatting of this note might be different from the original. Overview:  Qualifier: Diagnosis of  By: Shane Crutch, [name]   Neuropathy    Nonspecific abnormal findings on radiological and examination of skull and head 09/13/2012   Overweight (BMI 25.0-29.9) 07/21/2014   POSITIVE PPD 07/30/2009   Annotation: CXR clear ?  diagnosed in 2/11?  On INH/pyridoxine Qualifier: Diagnosis of  By: Amil Amen MD, [name]     Pure hypercholesterolemia 11/27/2016   Retention of urine 06/25/2011   Right flank pain    TOBACCO ABUSE 09/21/2009   Qualifier: Diagnosis of  By: Amil Amen MD, Winona Legato of this note might be different from the original. Overview:  Qualifier: Diagnosis of  By: Amil Amen MD, Elinor Parkinson, SERUM, ELEVATED 09/21/2009   Qualifier: Diagnosis of  By: Amil Amen MD, [name]     Trigeminal neuralgia    Tuberculin test reaction 07/30/2009   Formatting of this note might be different from the original. Overview:  Annotation: CXR clear ?   diagnosed in 2/11?  On INH/pyridoxine Qualifier: Diagnosis of  By: Amil Amen MD, [name]   Type 2 diabetes mellitus with hyperglycemia ([place]) 07/30/2009   Qualifier: Diagnosis of  By: Amil Amen MD, Winona Legato of this note might be different from the original. Overview:  Qualifier: Diagnosis of  By: Amil Amen MD, [name]   Type 2 diabetes mellitus with hyperglycemia, with long-term current use of insulin ([place]) 07/30/2009   Formatting of this note might be different from the original. Overview:  05/30/2015 A1C was 16.7% (!)  Overview:  05/30/2015 A1C was 16.7% (!) Formatting of this note might be different from the original. 05/30/2015 A1C was 16.7% (!)   Type 2 diabetes mellitus with stage 3 chronic kidney disease ([place]) 09/14/2009   Qualifier: Diagnosis of  By: Shane Crutch, [name]    ULCER-GASTRIC 09/28/2009   Qualifier: Diagnosis of  By: Chester Holstein NP, Paula     URINARY TRACT INFECTION 09/21/2009   Qualifier: Diagnosis of  By: Amil Amen MD, Louanne Belton, CANDIDAL 12/25/2009   Qualifier: Diagnosis of  By: Amil Amen MD, Tessa Lerner D deficiency 04/25/2013     Social History   Socioeconomic History   Marital status: Single    Spouse name: Not on file   Number of children: Not on file   Years of education: Not on file   Highest education level: Not on file  Occupational History   Not on file  Tobacco Use   Smoking status: Never   Smokeless tobacco: Never  Vaping Use   Vaping Use: Never used  Substance and Sexual Activity   Alcohol use: Yes    Comment: occ   Drug use: No   Sexual activity: Not on file  Other Topics Concern   Not on file  Social History Narrative   Not on file   Social Determinants of Health   Financial Resource Strain: Not on file  Food Insecurity: Not on file  Transportation Needs: Not on file  Physical Activity: Not on file  Stress: Not on file  Social Connections: Not on file  Intimate Partner Violence: Not on file    Past  Surgical History:  Procedure Laterality Date   ABDOMINAL HYSTERECTOMY     partial   CHOLECYSTECTOMY      Family History  Problem Relation Age of Onset   Diabetes Mother    Diabetes Father  Allergies  Allergen Reactions   Insulin Aspart Other (See Comments) and Swelling    adverse reaction to Novolog    Latex Itching and Rash   Morphine Itching and Rash    Current Outpatient Medications on File Prior to Visit  Medication Sig Dispense Refill   albuterol (VENTOLIN HFA) 108 (90 Base) MCG/ACT inhaler Inhale 2 puffs into the lungs every 6 (six) hours as needed for wheezing or shortness of breath. 18 g 2   amLODipine (NORVASC) 5 MG tablet Take 1 tablet by mouth once daily 30 tablet 0   aspirin 81 MG tablet Take 81 mg by mouth daily.     atorvastatin (LIPITOR) 80 MG tablet Take 1 tablet by mouth once daily 60 tablet 0   atorvastatin (LIPITOR) 80 MG tablet Take 1 tablet (80 mg total) by mouth daily. 30 tablet 11   azelastine (ASTELIN) 0.1 % nasal spray Place 2 sprays into both nostrils 2 (two) times daily. 30 mL 1   busPIRone (BUSPAR) 15 MG tablet Take 1 tablet (15 mg total) by mouth 2 (two) times daily. 90 tablet 3   busPIRone (BUSPAR) 15 MG tablet Take 1 tablet (15 mg total) by mouth 3 (three) times daily. 21 tablet 0   carvedilol (COREG) 25 MG tablet Take 1 tablet (25 mg total) by mouth 2 (two) times daily with a meal. 180 tablet 0   cephALEXin (KEFLEX) 500 MG capsule Take 1 capsule (500 mg total) by mouth 2 (two) times daily. 14 capsule 0   Cholecalciferol (VITAMIN D3) 400 units CAPS Take 400 mg by mouth daily.     FARXIGA 10 MG TABS tablet Take 1 tablet by mouth once daily 90 tablet 0   fluticasone (FLONASE) 50 MCG/ACT nasal spray Place 2 sprays into both nostrils daily. 16 g 1   fluticasone furoate-vilanterol (BREO ELLIPTA) 100-25 MCG/INH AEPB Inhale 1 puff into the lungs daily. 100 each 2   gabapentin (NEURONTIN) 300 MG capsule Take 2 capsules (600 mg total) by mouth 3 (three)  times daily. 90 capsule 0   insulin degludec (TRESIBA) 100 UNIT/ML FlexTouch Pen Inject 40 Units into the skin daily at 10 pm. 100 mL 2   meloxicam (MOBIC) 7.5 MG tablet Take 1-2 tablets (7.5-15 mg total) by mouth daily. 60 tablet 0   metFORMIN (GLUCOPHAGE-XR) 500 MG 24 hr tablet Take 500 mg by mouth daily.     methocarbamol (ROBAXIN) 750 MG tablet Take 750 mg by mouth every 8 (eight) hours as needed for muscle spasms.     methocarbamol (ROBAXIN-750) 750 MG tablet 1 tab po q day prn lower back pain/spasms 30 tablet 0   nitroGLYCERIN (NITROSTAT) 0.4 MG SL tablet Place 0.4 mg under the tongue every 5 (five) minutes as needed for chest pain.     ondansetron (ZOFRAN ODT) 4 MG disintegrating tablet Take 1 tablet (4 mg total) by mouth every 8 (eight) hours as needed for nausea or vomiting. 10 tablet 0   oxycodone (OXY-IR) 5 MG capsule 2 tab po bid prn severe pain 20 capsule 0   predniSONE (STERAPRED UNI-PAK 21 TAB) 10 MG (21) TBPK tablet Standard 6 day taper 21 tablet 0   promethazine (PHENERGAN) 12.5 MG tablet Take 12.5 mg by mouth every 8 (eight) hours as needed for nausea/vomiting.     promethazine (PHENERGAN) 12.5 MG tablet Take 1 tablet (12.5 mg total) by mouth every 8 (eight) hours as needed for nausea or vomiting. 8 tablet 0   rizatriptan (MAXALT-MLT) 10 MG disintegrating  tablet Take 10 mg by mouth daily as needed for migraine.     sertraline (ZOLOFT) 100 MG tablet Take 1 tablet (100 mg total) by mouth daily. 30 tablet 3   spironolactone (ALDACTONE) 25 MG tablet Take 1 tablet by mouth once daily 30 tablet 0   topiramate (TOPAMAX) 50 MG tablet Take 1 tablet (50 mg total) by mouth 2 (two) times daily. 60 tablet 12   No current facility-administered medications on file prior to visit.    BP (!) 147/100   Pulse 87   Resp 18   Ht 6' (1.829 m)   Wt 172 lb 6.4 oz (78.2 kg)   SpO2 99%   BMI 23.38 kg/m       Objective:   Physical Exam   General Mental Status- Alert. General Appearance- Not  in acute distress.   Skin General: Color- Normal Color. Moisture- Normal Moisture.  Neck Carotid Arteries- Normal color. Moisture- Normal Moisture. No carotid bruits. No JVD.  Chest and Lung Exam Auscultation: Breath Sounds:-Normal.  Cardiovascular Auscultation:Rythm- Regular. Murmurs & Other Heart Sounds:Auscultation of the heart reveals- No Murmurs.  Abdomen Inspection:-Inspeection Normal. Palpation/Percussion:Note:No mass. Palpation and Percussion of the abdomen reveal- Non Tender, Non Distended + BS, no rebound or guarding.    Neurologic Cranial Nerve exam:- CN III-XII intact(No nystagmus), symmetric smile. Drift Test:- No drift. Romberg Exam:- Negative.  Heal to Toe Gait exam:-Normal. Finger to Nose:- Normal/Intact Strength:- 5/5 equal and symmetric strength both upper and lower extremities.      Assessment & Plan:     History of migraine headache for years since 55 years of age.  But more frequent/almost daily since in February per patient's report.  Headaches present despite use of Topamax daily.  Also she had moderate elevated sed rate for which I prescribed prednisone last visit.  Her headache did not resolve with prednisone. Her neurologist prescribed  nurtec but patient states that she never got the prescription filled.  We will going to try to send a message to neurologist to see if they can try to get her that prescription to try before upcoming follow-up at the end of the month.  History of hypertension.  Patient blood pressure elevated today.  Decided to increase her amlodipine to 10 mg daily.  Sed rate order placed today.  I forgot to advise patient to do that test today in the office.  We will send staff message to call patient and have her get scheduled for that later in the week.   Follow-up in 5 weeks or sooner as needed  Time spent with patient today was  42 minutes which consisted of chart review, filling out fmal form discussing diagnosis, work up  ,treatment and documentation.

## 2020-09-10 NOTE — Telephone Encounter (Signed)
Pt called and lab appointment made for 09/14/20

## 2020-09-10 NOTE — Telephone Encounter (Signed)
Forgot to tell pt since pt is up that want to increase amlodipine to 10 mg daily. Also wanted her to repeat sed rate today. Can you get her scheduled for sed rate later in week or return today if possible.

## 2020-09-10 NOTE — Patient Instructions (Signed)
History of migraine headache for years since 55 years of age.  But more frequent/almost daily since in February per patient's report.  Headaches present despite use of Topamax daily.  Also she had moderate elevated sed rate for which I prescribed prednisone last visit.  Her headache did not resolve with prednisone. Her neurologist prescribed  nurtec but patient states that she never got the prescription filled.  We will going to try to send a message to neurologist to see if they can try to get her that prescription to try before upcoming follow-up at the end of the month.  History of hypertension.  Patient blood pressure elevated today.  Decided to increase her amlodipine to 10 mg daily.  Sed rate order placed today.  I forgot to advise patient to do that test today in the office.  We will send staff message to call patient and have her get scheduled for that later in the week.   Follow-up in 5 weeks or sooner as needed

## 2020-09-14 ENCOUNTER — Other Ambulatory Visit: Payer: Self-pay

## 2020-09-14 ENCOUNTER — Institutional Professional Consult (permissible substitution): Payer: 59 | Admitting: Diagnostic Neuroimaging

## 2020-09-27 ENCOUNTER — Institutional Professional Consult (permissible substitution): Payer: 59 | Admitting: Diagnostic Neuroimaging

## 2020-10-12 ENCOUNTER — Other Ambulatory Visit: Payer: Self-pay | Admitting: Medical

## 2020-10-12 ENCOUNTER — Telehealth: Payer: Self-pay | Admitting: Medical

## 2020-10-12 NOTE — Telephone Encounter (Signed)
Medication: promethazine (PHENERGAN) 12.5 MG tablet  Has the patient contacted their pharmacy? Yes.   (If no, request that the patient contact the pharmacy for the refill.) (If yes, when and what did the pharmacy advise?)  Preferred Pharmacy (with phone number or street name): [place], [place] [address]  Phone:  [phone]  Fax:  [phone]  Agent: Please be advised that RX refills may take up to 3 business days. We ask that you follow-up with your pharmacy.

## 2020-10-17 NOTE — Telephone Encounter (Signed)
Rx sent 09/02

## 2020-10-24 ENCOUNTER — Telehealth: Payer: Self-pay

## 2020-10-24 ENCOUNTER — Ambulatory Visit: Payer: 59 | Admitting: Family Medicine

## 2020-10-24 NOTE — Telephone Encounter (Signed)
Pt NS for virtual PV.  Attempted to reach x 3 --LVMM to call back by 5:00 today 9/14 to prevent cancellation of procedure,

## 2020-10-24 NOTE — Telephone Encounter (Signed)
No return call to r/s PV by EOB today.  No show letter sent.  Procedure cancelled.

## 2020-10-29 ENCOUNTER — Other Ambulatory Visit: Payer: Self-pay | Admitting: Medical

## 2020-10-29 NOTE — Telephone Encounter (Signed)
Requesting: oxy Contract:08/16/2020 UDS:02/2018 Last Visit:09/10/2020 Next Visit: n/a Last Refill:08/24/2020  Please Advise

## 2020-10-29 NOTE — Telephone Encounter (Signed)
Pt need meds refilled.  metFORMIN (GLUCOPHAGE-XR) 500 MG 24 hr tablet   oxycodone (OXY-IR) 5 MG capsule  promethazine (PHENERGAN) 12.5 MG tablet  [facility] [address] [place] - [address]  [address] [place], [place] [address]  Phone:  [phone]  Fax:  [phone]

## 2020-10-30 ENCOUNTER — Other Ambulatory Visit: Payer: Self-pay

## 2020-10-30 ENCOUNTER — Ambulatory Visit (INDEPENDENT_AMBULATORY_CARE_PROVIDER_SITE_OTHER): Payer: BC Managed Care – PPO | Admitting: Medical

## 2020-10-30 VITALS — BP 124/84 | HR 93 | Temp 98.2°F | Resp 18 | Ht 72.0 in | Wt 167.0 lb

## 2020-10-30 DIAGNOSIS — Z23 Encounter for immunization: Secondary | ICD-10-CM

## 2020-10-30 DIAGNOSIS — R300 Dysuria: Secondary | ICD-10-CM | POA: Diagnosis not present

## 2020-10-30 DIAGNOSIS — Z79899 Other long term (current) drug therapy: Secondary | ICD-10-CM | POA: Diagnosis not present

## 2020-10-30 DIAGNOSIS — F419 Anxiety disorder, unspecified: Secondary | ICD-10-CM

## 2020-10-30 DIAGNOSIS — G894 Chronic pain syndrome: Secondary | ICD-10-CM

## 2020-10-30 DIAGNOSIS — F3289 Other specified depressive episodes: Secondary | ICD-10-CM

## 2020-10-30 DIAGNOSIS — E1165 Type 2 diabetes mellitus with hyperglycemia: Secondary | ICD-10-CM

## 2020-10-30 LAB — POC URINALSYSI DIPSTICK (AUTOMATED)
Bilirubin, UA: NEGATIVE
Blood, UA: NEGATIVE
Glucose, UA: POSITIVE — AB
Ketones, UA: NEGATIVE
Leukocytes, UA: NEGATIVE
Nitrite, UA: NEGATIVE
Protein, UA: NEGATIVE
Spec Grav, UA: 1.015 (ref 1.010–1.025)
Urobilinogen, UA: 0.2 E.U./dL
pH, UA: 5 (ref 5.0–8.0)

## 2020-10-30 MED ORDER — BUSPIRONE HCL 15 MG PO TABS: 15.0000 mg | ORAL_TABLET | Freq: Two times a day (BID) | ORAL | 3 refills | Status: DC

## 2020-10-30 MED ORDER — OXYCODONE-ACETAMINOPHEN 10-325 MG PO TABS: 1.0000 | ORAL_TABLET | Freq: Three times a day (TID) | ORAL | 0 refills | Status: DC | PRN

## 2020-10-30 MED ORDER — METHOCARBAMOL 750 MG PO TABS: ORAL_TABLET | ORAL | 0 refills | Status: DC

## 2020-10-30 MED ORDER — METFORMIN HCL ER 500 MG PO TB24: 500.0000 mg | ORAL_TABLET | Freq: Every day | ORAL | 2 refills | Status: DC

## 2020-10-30 NOTE — Telephone Encounter (Signed)
Patient coming in today  

## 2020-10-30 NOTE — Patient Instructions (Addendum)
Chronic pain syndrome/back pain.  We will get you to update urine drug screen today and contract.  Self-limited number of oxycodone 10/325 to your pharmacy.  Please continue to use sparingly.  If pain worsens and requiring more tablets consider referral back to pain specialist.  History of anxiety and depression.  Recommend continue sertraline 100 mg daily and BuSpar 15 mg twice daily.  Challenging circumstances recently with mother passing and daughter-in-law passing.  If your mood worsens or changes let us know.  Dysuria recently.  UA and culture done today.  Will follow culture and prescribe antibiotics accordingly.  Urinalysis shows some sugar but no obvious indicators of infection presently.  History of diabetes with A1c elevated last time.  In addition you ran out of metformin recently.  Refilling her metformin today.  Continue Tyler Aas but remember you can do titration off on units as explained.  Follow-up in 3 months or sooner if needed.

## 2020-10-30 NOTE — Progress Notes (Signed)
Subjective:    Patient ID: [name], female    DOB: 07/19/1965, 55 y.o.   MRN: [id number]  HPI  Pt in for follow up.  Hx of chronic low back pain. Pt uses oxycodone.Pt states back pain has been little worse recently. Pt now working at [employer]. She is on her feet all day. She has to move boxes.   Pt was living in [place]. She was training there. Pt saw Dr in [facility]. Advised therapy but no medications.   Pain lower back and thoracic area.   Hx of chronic back pain syndrome.  Chronic pain syndrome (Primary Dx);  Diabetic peripheral neuropathy ([place]);  Spinal stenosis of lumbar region with neurogenic claudication;  Lumbar facet arthropathy    Assessment: 1. Chronic pain syndrome  2. Diabetic peripheral neuropathy ([place])  3. Spinal stenosis of lumbar region with neurogenic claudication  4. Lumbar facet arthropathy   Pt takes oxycodone very sparingly. Her last 2 rx  08-03-2020 has last until now.  Pt states last time in she gave uds but not ran?     Pt also mentions since I last saw her. Her mom passed away. Also her grandchildren mother/her son wife passed away. Daugher in law was 7 yo and 26 yo. Pt had already been struggling with anxiety and depressed mood. On sertaline 100 mg daily and buspar 15 mg bid. Meds helpin.  Pt needs refill of metformin. Last a1c was  7.9. pt uses tresiba 12 unit. On metformin. Pt has not been titrating as I had asked.  Acute on set of uti symptoms last 24 hours. See ros.   Review of Systems  Constitutional:  Negative for chills, fatigue and fever.  HENT:  Negative for mouth sores.   Respiratory:  Negative for cough, chest tightness, shortness of breath and wheezing.   Cardiovascular:  Negative for chest pain and palpitations.  Gastrointestinal:  Negative for abdominal pain.  Genitourinary:  Positive for dysuria and frequency. Negative for pelvic pain and urgency.       For 2days.  Musculoskeletal:  Positive for back pain.  Negative for joint swelling, myalgias, neck pain and neck stiffness.  Skin:  Negative for pallor and rash.  Neurological:  Negative for dizziness, numbness and headaches.  Psychiatric/Behavioral:  Negative for behavioral problems, confusion and sleep disturbance. The patient is nervous/anxious.     Past Medical History:  Diagnosis Date   ABDOMINAL PAIN-EPIGASTRIC 09/14/2009   Qualifier: Diagnosis of  By: Trellis Paganini PA-c, [name]    Acute cystitis 07/07/2017   ANKLE PAIN, LEFT 12/25/2009   Qualifier: Diagnosis of  By: Amil Amen MD, [name]     Anxiety 01/22/2016   CAD (coronary artery disease) 07/07/2017   Cardiac murmur 02/01/2019   Cardiomyopathy, secondary ([place]) 09/06/2009   Qualifier: Diagnosis of  By: Arvid Right   Formatting of this note might be different from the original. Overview:  Qualifier: Diagnosis of  By: Arvid Right   Cardiomyopathy, unspecified ([place]) 09/06/2009   Formatting of this note might be different from the original. Overview:  Overview:  Qualifier: Diagnosis of  By: Arvid Right  Overview:  Overview:  Qualifier: Diagnosis of  By: Arvid Right Formatting of this note might be different from the original. Overview:  Qualifier: Diagnosis of  By: Arvid Right   Cerebrovascular disease 09/13/2012   Cervical cancer ([place])    had surgery in 2001.   CHEST PAIN, ATYPICAL 07/30/2009  Qualifier: Diagnosis of  By: Amil Amen MD, Winona Legato of this note might be different from the original. Overview:  Qualifier: Diagnosis of  By: Amil Amen MD, [name]   Chronic back pain    Chronic kidney disease, stage III (moderate) ([place]) 05/23/2014   Chronic pain syndrome 01/23/2016   Coronary artery disease    30% lesions noted   Current use of insulin ([place]) 05/23/2014   Depression    Diabetes mellitus    DIABETES MELLITUS, TYPE II, UNCONTROLLED 07/30/2009   Qualifier: Diagnosis of  By: Amil Amen MD,  [name]     Diabetes type 2, uncontrolled ([place]) 05/23/2014   Diabetic gastroparesis associated with type 1 diabetes mellitus ([place]) 11/27/2016   DIABETIC PERIPHERAL NEUROPATHY 09/21/2009   Qualifier: Diagnosis of  By: Amil Amen MD, [name]     Diabetic peripheral neuropathy ([place]) 01/23/2016   Dysuria 12/25/2009   Qualifier: Diagnosis of  By: Amil Amen MD, [name]     Elevation of level of transaminase or lactic acid dehydrogenase (LDH) 09/21/2009   Formatting of this note might be different from the original. Overview:  Qualifier: Diagnosis of  By: Amil Amen MD, [place]   Essential hypertension 08/30/2009   Qualifier: Diagnosis of  By: Lovette Cliche, CNA, Christy     Gastric atony 07/30/2009   Formatting of this note might be different from the original. Overview:  Qualifier: Diagnosis of  By: Amil Amen MD, [place]   Gastroesophageal reflux disease 07/30/2009   Formatting of this note might be different from the original. Overview:  Overview:  Qualifier: Diagnosis of  By: Amil Amen MD, Elby Showers: Diagnosis of  By: Chester Holstein NP, Ellwood Handler of this note might be different from the original. Overview:  Qualifier: Diagnosis of  By: Amil Amen MD, Elby Showers: Diagnosis of  By: Chester Holstein NP, Nevin Bloodgood   Gastroparesis 07/30/2009   Qualifier: Diagnosis of  By: Amil Amen MD, [name]     GERD 07/30/2009   Qualifier: Diagnosis of  By: Amil Amen MD, Elby Showers: Diagnosis of  By: Chester Holstein NP, Nevin Bloodgood     GERD (gastroesophageal reflux disease)    History of cervical cancer 08/17/2015   Status post partial hysterectomy  Formatting of this note might be different from the original. Status post partial hysterectomy   HLD (hyperlipidemia) 07/30/2009   Qualifier: Diagnosis of  By: Amil Amen MD, [name]     Hyperlipidemia    Hyperlipidemia, unspecified 07/30/2009   Formatting of this note might be different from the original. Overview:  Qualifier: Diagnosis of  By: Amil Amen MD, Winona Legato of this note might be different from the original. Overview:  Qualifier: Diagnosis of  By: Amil Amen MD, [name]   Hypertension    INSOMNIA 09/21/2009   Qualifier: Diagnosis of  By: Amil Amen MD, [name]     Lumbar facet arthropathy 05/14/2016   Lumbar spinal stenosis 02/11/2018   Metatarsalgia of both feet 03/11/2017   Migraine 09/13/2012   Overview:  IMPRESSION: possible abd migraine   Mixed dyslipidemia 02/01/2019   Nausea & vomiting 07/07/2017   Nausea with vomiting, unspecified 09/14/2009   Qualifier: Diagnosis of  By: Shane Crutch, [name]   Formatting of this note might be different from the original. Overview:  Qualifier: Diagnosis of  By: Shane Crutch, [name]   Neuropathy    Nonspecific abnormal findings on radiological and examination of skull and head 09/13/2012   Overweight (BMI 25.0-29.9) 07/21/2014   POSITIVE PPD 07/30/2009   Annotation: CXR clear ?  diagnosed in 2/11?  On INH/pyridoxine Qualifier: Diagnosis of  By: Amil Amen MD, [name]     Pure hypercholesterolemia 11/27/2016   Retention of urine 06/25/2011   Right flank pain    TOBACCO ABUSE 09/21/2009   Qualifier: Diagnosis of  By: Amil Amen MD, Winona Legato of this note might be different from the original. Overview:  Qualifier: Diagnosis of  By: Amil Amen MD, Elinor Parkinson, SERUM, ELEVATED 09/21/2009   Qualifier: Diagnosis of  By: Amil Amen MD, [name]     Trigeminal neuralgia    Tuberculin test reaction 07/30/2009   Formatting of this note might be different from the original. Overview:  Annotation: CXR clear ?  diagnosed in 2/11?  On INH/pyridoxine Qualifier: Diagnosis of  By: Amil Amen MD, [name]   Type 2 diabetes mellitus with hyperglycemia ([place]) 07/30/2009   Qualifier: Diagnosis of  By: Amil Amen MD, Winona Legato of this note might be different from the original. Overview:  Qualifier: Diagnosis of  By: Amil Amen MD, [name]   Type 2 diabetes mellitus with hyperglycemia, with long-term  current use of insulin ([place]) 07/30/2009   Formatting of this note might be different from the original. Overview:  05/30/2015 A1C was 16.7% (!)  Overview:  05/30/2015 A1C was 16.7% (!) Formatting of this note might be different from the original. 05/30/2015 A1C was 16.7% (!)   Type 2 diabetes mellitus with stage 3 chronic kidney disease ([employer]) 09/14/2009   Qualifier: Diagnosis of  By: Shane Crutch, [name]    ULCER-GASTRIC 09/28/2009   Qualifier: Diagnosis of  By: Chester Holstein NP, Paula     URINARY TRACT INFECTION 09/21/2009   Qualifier: Diagnosis of  By: Amil Amen MD, Louanne Belton, CANDIDAL 12/25/2009   Qualifier: Diagnosis of  By: Amil Amen MD, Tessa Lerner D deficiency 04/25/2013     Social History   Socioeconomic History   Marital status: Single    Spouse name: Not on file   Number of children: Not on file   Years of education: Not on file   Highest education level: Not on file  Occupational History   Not on file  Tobacco Use   Smoking status: Never   Smokeless tobacco: Never  Vaping Use   Vaping Use: Never used  Substance and Sexual Activity   Alcohol use: Yes    Comment: occ   Drug use: No   Sexual activity: Not on file  Other Topics Concern   Not on file  Social History Narrative   Not on file   Social Determinants of Health   Financial Resource Strain: Not on file  Food Insecurity: Not on file  Transportation Needs: Not on file  Physical Activity: Not on file  Stress: Not on file  Social Connections: Not on file  Intimate Partner Violence: Not on file    Past Surgical History:  Procedure Laterality Date   ABDOMINAL HYSTERECTOMY     partial   CHOLECYSTECTOMY      Family History  Problem Relation Age of Onset   Diabetes Mother    Diabetes Father     Allergies  Allergen Reactions   Insulin Aspart Other (See Comments) and Swelling    adverse reaction to Novolog    Latex Itching and Rash   Morphine Itching and Rash    Current Outpatient  Medications on File Prior to Visit  Medication Sig Dispense Refill   albuterol (VENTOLIN HFA) 108 (90 Base) MCG/ACT inhaler Inhale 2 puffs into the lungs every 6 (  six) hours as needed for wheezing or shortness of breath. 18 g 2   amLODipine (NORVASC) 10 MG tablet Take 1 tablet (10 mg total) by mouth daily. 30 tablet 2   amLODipine (NORVASC) 5 MG tablet Take 1 tablet by mouth once daily 90 tablet 1   aspirin 81 MG tablet Take 81 mg by mouth daily.     atorvastatin (LIPITOR) 80 MG tablet Take 1 tablet by mouth once daily 60 tablet 0   atorvastatin (LIPITOR) 80 MG tablet Take 1 tablet (80 mg total) by mouth daily. 30 tablet 11   azelastine (ASTELIN) 0.1 % nasal spray Place 2 sprays into both nostrils 2 (two) times daily. 30 mL 1   busPIRone (BUSPAR) 15 MG tablet Take 1 tablet (15 mg total) by mouth 2 (two) times daily. 90 tablet 3   busPIRone (BUSPAR) 15 MG tablet Take 1 tablet (15 mg total) by mouth 3 (three) times daily. 21 tablet 0   carvedilol (COREG) 25 MG tablet Take 1 tablet (25 mg total) by mouth 2 (two) times daily with a meal. 180 tablet 0   cephALEXin (KEFLEX) 500 MG capsule Take 1 capsule (500 mg total) by mouth 2 (two) times daily. 14 capsule 0   Cholecalciferol (VITAMIN D3) 400 units CAPS Take 400 mg by mouth daily.     FARXIGA 10 MG TABS tablet Take 1 tablet by mouth once daily 90 tablet 0   fluticasone (FLONASE) 50 MCG/ACT nasal spray Place 2 sprays into both nostrils daily. 16 g 1   fluticasone furoate-vilanterol (BREO ELLIPTA) 100-25 MCG/INH AEPB Inhale 1 puff into the lungs daily. 100 each 2   gabapentin (NEURONTIN) 300 MG capsule TAKE 2 CAPSULES BY MOUTH THREE TIMES DAILY 180 capsule 1   insulin degludec (TRESIBA) 100 UNIT/ML FlexTouch Pen Inject 40 Units into the skin daily at 10 pm. 100 mL 2   meloxicam (MOBIC) 7.5 MG tablet Take 1-2 tablets (7.5-15 mg total) by mouth daily. 60 tablet 0   metFORMIN (GLUCOPHAGE-XR) 500 MG 24 hr tablet Take 500 mg by mouth daily.      methocarbamol (ROBAXIN) 750 MG tablet Take 750 mg by mouth every 8 (eight) hours as needed for muscle spasms.     methocarbamol (ROBAXIN-750) 750 MG tablet 1 tab po q day prn lower back pain/spasms 30 tablet 0   nitroGLYCERIN (NITROSTAT) 0.4 MG SL tablet Place 0.4 mg under the tongue every 5 (five) minutes as needed for chest pain.     ondansetron (ZOFRAN ODT) 4 MG disintegrating tablet Take 1 tablet (4 mg total) by mouth every 8 (eight) hours as needed for nausea or vomiting. 10 tablet 0   oxycodone (OXY-IR) 5 MG capsule 2 tab po bid prn severe pain 20 capsule 0   predniSONE (STERAPRED UNI-PAK 21 TAB) 10 MG (21) TBPK tablet Standard 6 day taper 21 tablet 0   promethazine (PHENERGAN) 12.5 MG tablet Take 12.5 mg by mouth every 8 (eight) hours as needed for nausea/vomiting.     promethazine (PHENERGAN) 12.5 MG tablet TAKE 1 TABLET BY MOUTH EVERY 8 HOURS AS NEEDED FOR NAUSEA FOR VOMITING 12 tablet 3   rizatriptan (MAXALT-MLT) 10 MG disintegrating tablet Take 10 mg by mouth daily as needed for migraine.     sertraline (ZOLOFT) 100 MG tablet Take 1 tablet (100 mg total) by mouth daily. 30 tablet 3   spironolactone (ALDACTONE) 25 MG tablet Take 1 tablet by mouth once daily 30 tablet 0   topiramate (TOPAMAX) 50 MG tablet  Take 1 tablet (50 mg total) by mouth 2 (two) times daily. 60 tablet 12   No current facility-administered medications on file prior to visit.    BP 124/84   Pulse 93   Temp 98.2 F (36.8 C)   Resp 18   Ht 6' (1.829 m)   Wt 167 lb (75.8 kg)   SpO2 100%   BMI 22.65 kg/m       Objective:   Physical Exam  General Mental Status- Alert. General Appearance- Not in acute distress.   Skin General: Color- Normal Color. Moisture- Normal Moisture.  Neck Carotid Arteries- Normal color. Moisture- Normal Moisture. No carotid bruits. No JVD.  Chest and Lung Exam Auscultation: Breath Sounds:-Normal.  Cardiovascular Auscultation:Rythm- Regular. Murmurs & Other Heart  Sounds:Auscultation of the heart reveals- No Murmurs.  Abdomen Inspection:-Inspeection Normal. Palpation/Percussion:Note:No mass. Palpation and Percussion of the abdomen reveal- Non Tender, Non Distended + BS, no rebound or guarding.  Neurologic Cranial Nerve exam:- CN III-XII intact(No nystagmus), symmetric smile. Strength:- 5/5 equal and symmetric strength both upper and lower extremities.       Assessment & Plan:   Patient Instructions  Chronic pain syndrome/back pain.  We will get you to update urine drug screen today and contract.  Self-limited number of oxycodone 10/325 to your pharmacy.  Please continue to use sparingly.  If pain worsens and requiring more tablets consider referral back to pain specialist.  History of anxiety and depression.  Recommend continue sertraline 100 mg daily and BuSpar 15 mg twice daily.  Challenging circumstances recently with mother passing and daughter-in-law passing.  If your mood worsens or changes let us know.  Dysuria recently.  UA and culture done today.  Will follow culture and prescribe antibiotics accordingly.  Urinalysis shows some sugar but no obvious indicators of infection presently.  History of diabetes with A1c elevated last time.  In addition you ran out of metformin recently.  Refilling her metformin today.  Continue Tyler Aas but remember you can do titration off on units as explained.  Follow-up in 3 months or sooner if needed.    Time spent with patient today was 41  minutes which consisted of chart revdiew, discussing diagnosis, work up treatment and documentation.

## 2020-10-30 NOTE — Telephone Encounter (Signed)
Please deny medication , so it can get out of the medication refill pool

## 2020-11-05 LAB — DRUG MONITORING PANEL 375977 , URINE
Alcohol Metabolites: POSITIVE ng/mL — AB (ref <500)
Amphetamines: NEGATIVE ng/mL (ref <500)
Barbiturates: NEGATIVE ng/mL (ref <300)
Benzodiazepines: NEGATIVE ng/mL (ref <100)
Cocaine Metabolite: NEGATIVE ng/mL (ref <150)
Codeine: NEGATIVE ng/mL (ref <50)
Desmethyltramadol: NEGATIVE ng/mL (ref <100)
Ethyl Glucuronide (ETG): NEGATIVE ng/mL (ref <500)
Ethyl Sulfate (ETS): 371 ng/mL — ABNORMAL HIGH (ref <100)
Hydrocodone: NEGATIVE ng/mL (ref <50)
Hydromorphone: NEGATIVE ng/mL (ref <50)
Marijuana Metabolite: NEGATIVE ng/mL (ref <20)
Morphine: NEGATIVE ng/mL (ref <50)
Norhydrocodone: NEGATIVE ng/mL (ref <50)
Noroxycodone: 300 ng/mL — ABNORMAL HIGH (ref <50)
Opiates: NEGATIVE ng/mL (ref <100)
Oxycodone: 244 ng/mL — ABNORMAL HIGH (ref <50)
Oxycodone: POSITIVE ng/mL — AB (ref <100)
Oxymorphone: 137 ng/mL — ABNORMAL HIGH (ref <50)
Tramadol: NEGATIVE ng/mL (ref <100)

## 2020-11-05 LAB — DM TEMPLATE

## 2020-11-07 ENCOUNTER — Encounter: Payer: 59 | Admitting: Gastroenterology

## 2020-11-08 ENCOUNTER — Telehealth: Payer: Self-pay | Admitting: Medical

## 2020-11-08 NOTE — Telephone Encounter (Signed)
Requesting: oxycodone Contract:08/16/20 UDS:10/30/20 Last Visit:10/30/20 Next Visit:n/a Last Refill:  Please Advise

## 2020-11-08 NOTE — Telephone Encounter (Signed)
Medication: oxyCODONE-acetaminophen (PERCOCET) 10-325 MG tablet   Has the patient contacted their pharmacy? No. (If no, request that the patient contact the pharmacy for the refill.) (If yes, when and what did the pharmacy advise?)   Preferred Pharmacy (with phone number or street name): [facility]  [place], [place] [address]  Phone:  [phone]  Fax:  [phone]

## 2020-11-09 NOTE — Telephone Encounter (Signed)
States she only takes the tablets like 3 times a week , stated and you sent the wrong medication to the pharmacy she needs the HCL pills instead of the ones with tylenol because they make her sick and she has never taken those, she never picked up the script for the 24 pills.

## 2020-11-12 NOTE — Telephone Encounter (Signed)
Spoke with Bill Salinas at [facility] , stated script with oxycodone with tylenol is still at the pharmacy on hold as on 11/01/20

## 2020-11-12 NOTE — Telephone Encounter (Signed)
Spoke with Sushmai at [facility] again , she looked to see if [facility] had [facility] other [facility] and to see where [facility] ( doesn't exist  ) , and Sushmai went on PMP as well and it did show #[id number] but she clicked on the number it [facility] [id number] .   PMP states patient filled script , but script was put back like patient stated on phone last week, Sushmai stated she will try to fix it on the PMP website and also [facility] script that " pt refused pick up due to being wrong medication"

## 2020-11-12 NOTE — Addendum Note (Signed)
Addended by: Anabel Halon on: 11/12/2020 10:38 AM   Modules accepted: Orders

## 2020-11-12 NOTE — Telephone Encounter (Signed)
As stated below --  [facility] clicked the [facility][id number] on PMP website and it came up with their store address , there is no [id number] , and also patient has never used [facility] before.

## 2020-11-13 ENCOUNTER — Telehealth: Payer: Self-pay | Admitting: Medical

## 2020-11-13 MED ORDER — OXYCODONE HCL 5 MG PO TABS: 5.0000 mg | ORAL_TABLET | Freq: Four times a day (QID) | ORAL | 0 refills | Status: DC | PRN

## 2020-11-13 NOTE — Telephone Encounter (Signed)
As stated below:  PMP states patient filled script , but script was put back like patient stated on phone last week, Sushmai stated she will try to fix it on the PMP website and also [facility] that [facility] that " pt refused pick up due to being wrong medication"

## 2020-11-13 NOTE — Telephone Encounter (Signed)
Sent in oxycodone to pharmacy.

## 2020-11-16 ENCOUNTER — Telehealth: Payer: Self-pay | Admitting: Medical

## 2020-11-16 MED ORDER — INSULIN DEGLUDEC 100 UNIT/ML ~~LOC~~ SOPN: 40.0000 [IU] | PEN_INJECTOR | Freq: Every day | SUBCUTANEOUS | 2 refills | Status: DC

## 2020-11-16 NOTE — Telephone Encounter (Signed)
Rx sent 

## 2020-11-16 NOTE — Telephone Encounter (Signed)
Medication: insulin degludec (TRESIBA) 100 UNIT/ML FlexTouch Pen   Has the patient contacted their pharmacy? No. (If no, request that the patient contact the pharmacy for the refill.) (If yes, when and what did the pharmacy advise?)  Preferred Pharmacy (with phone number or street name):  [facility]  [place], [place] [address]  Phone:  [phone]  Fax:  [phone]

## 2020-11-30 ENCOUNTER — Telehealth (INDEPENDENT_AMBULATORY_CARE_PROVIDER_SITE_OTHER): Payer: BC Managed Care – PPO | Admitting: Family

## 2020-11-30 ENCOUNTER — Encounter: Payer: Self-pay | Admitting: Family

## 2020-11-30 DIAGNOSIS — G43809 Other migraine, not intractable, without status migrainosus: Secondary | ICD-10-CM | POA: Diagnosis not present

## 2020-11-30 DIAGNOSIS — J069 Acute upper respiratory infection, unspecified: Secondary | ICD-10-CM | POA: Insufficient documentation

## 2020-11-30 HISTORY — DX: Acute upper respiratory infection, unspecified: J06.9

## 2020-11-30 MED ORDER — PROMETHAZINE HCL 12.5 MG PO TABS: 12.5000 mg | ORAL_TABLET | Freq: Three times a day (TID) | ORAL | 0 refills | Status: DC | PRN

## 2020-11-30 NOTE — Assessment & Plan Note (Addendum)
Pt takes Topamax daily and has had to take a Maxalt daily since virus started. Advised adding the Robaxin she has bid, ok to cut in half during day if too drowsy. Also refilling her promethazine.

## 2020-11-30 NOTE — Progress Notes (Signed)
MyChart Video Visit    Virtual Visit via Video Note   This visit type was conducted due to national recommendations for restrictions regarding the COVID-19 Pandemic (e.g. social distancing) in an effort to limit this patient's exposure and mitigate transmission in our community. This patient is at least at moderate risk for complications without adequate follow up. This format is felt to be most appropriate for this patient at this time. Physical exam was limited by quality of the video and audio technology used for the visit. CMA was able to get the patient set up on a video visit.  Patient location: Home. Patient and provider in visit Provider location: Office  I discussed the limitations of evaluation and management by telemedicine and the availability of in person appointments. The patient expressed understanding and agreed to proceed.  Visit Date: 11/30/2020  Today's healthcare provider: Jeanie Sewer, NP     Subjective:    Patient ID: [name], female    DOB: 09/05/1965, 55 y.o.   MRN: [id number]  Chief Complaint  Patient presents with   Sore Throat    Symptoms Started 3 days ago.    Nasal Congestion   Headache   Cough   HPI:  Upper Respiratory Infection: Patient complains of symptoms of a URI. Symptoms include congestion, cough, and sore throat. Onset of symptoms was 3 days ago, headache is worsening since that time. She also c/o headache described as a migraine  for the past 3 days .  She is drinking moderate amounts of fluids. Evaluation to date: none. Treatment to date: none. Did receive the flu vaccine.   Migraine: pt reports worsening of pain, has had sx daily since her URI started. She is taking extra tylenol and her maxalt as directed without relief.  Past Medical History:  Diagnosis Date   ABDOMINAL PAIN-EPIGASTRIC 09/14/2009   Qualifier: Diagnosis of  By: Trellis Paganini PA-c, [name]    Acute cystitis 07/07/2017   ANKLE PAIN, LEFT 12/25/2009    Qualifier: Diagnosis of  By: Amil Amen MD, [name]     Anxiety 01/22/2016   CAD (coronary artery disease) 07/07/2017   Cardiac murmur 02/01/2019   Cardiomyopathy, secondary ([place]) 09/06/2009   Qualifier: Diagnosis of  By: Arvid Right   Formatting of this note might be different from the original. Overview:  Qualifier: Diagnosis of  By: Arvid Right   Cardiomyopathy, unspecified ([place]) 09/06/2009   Formatting of this note might be different from the original. Overview:  Overview:  Qualifier: Diagnosis of  By: Arvid Right  Overview:  Overview:  Qualifier: Diagnosis of  By: Arvid Right Formatting of this note might be different from the original. Overview:  Qualifier: Diagnosis of  By: Arvid Right   Cerebrovascular disease 09/13/2012   Cervical cancer ([place])    had surgery in 2001.   CHEST PAIN, ATYPICAL 07/30/2009   Qualifier: Diagnosis of  By: Amil Amen MD, Winona Legato of this note might be different from the original. Overview:  Qualifier: Diagnosis of  By: Amil Amen MD, [name]   Chronic back pain    Chronic kidney disease, stage III (moderate) ([place]) 05/23/2014   Chronic pain syndrome 01/23/2016   Coronary artery disease    30% lesions noted   Current use of insulin ([place]) 05/23/2014   Depression    Diabetes mellitus    DIABETES MELLITUS, TYPE II, UNCONTROLLED 07/30/2009   Qualifier: Diagnosis of  By: Amil Amen MD, Benjamine Mola  Diabetes type 2, uncontrolled 05/23/2014   Diabetic gastroparesis associated with type 1 diabetes mellitus ([place]) 11/27/2016   DIABETIC PERIPHERAL NEUROPATHY 09/21/2009   Qualifier: Diagnosis of  By: Amil Amen MD, [name]     Diabetic peripheral neuropathy ([place]) 01/23/2016   Dysuria 12/25/2009   Qualifier: Diagnosis of  By: Amil Amen MD, [name]     Elevation of level of transaminase or lactic acid dehydrogenase (LDH) 09/21/2009   Formatting of this note might be different  from the original. Overview:  Qualifier: Diagnosis of  By: Amil Amen MD, [place] hypertension 08/30/2009   Qualifier: Diagnosis of  By: Lovette Cliche, CNA, Christy     Gastric atony 07/30/2009   Formatting of this note might be different from the original. Overview:  Qualifier: Diagnosis of  By: Amil Amen MD, [name]   Gastroesophageal reflux disease 07/30/2009   Formatting of this note might be different from the original. Overview:  Overview:  Qualifier: Diagnosis of  By: Amil Amen MD, Elby Showers: Diagnosis of  By: Chester Holstein NP, Ellwood Handler of this note might be different from the original. Overview:  Qualifier: Diagnosis of  By: Amil Amen MD, Elby Showers: Diagnosis of  By: Chester Holstein NP, Nevin Bloodgood   Gastroparesis 07/30/2009   Qualifier: Diagnosis of  By: Amil Amen MD, [place]     GERD 07/30/2009   Qualifier: Diagnosis of  By: Amil Amen MD, Elby Showers: Diagnosis of  By: Chester Holstein NP, Nevin Bloodgood     GERD (gastroesophageal reflux disease)    History of cervical cancer 08/17/2015   Status post partial hysterectomy  Formatting of this note might be different from the original. Status post partial hysterectomy   HLD (hyperlipidemia) 07/30/2009   Qualifier: Diagnosis of  By: Amil Amen MD, [name]     Hyperlipidemia    Hyperlipidemia, unspecified 07/30/2009   Formatting of this note might be different from the original. Overview:  Qualifier: Diagnosis of  By: Amil Amen MD, Winona Legato of this note might be different from the original. Overview:  Qualifier: Diagnosis of  By: Amil Amen MD, [name]   Hypertension    INSOMNIA 09/21/2009   Qualifier: Diagnosis of  By: Amil Amen MD, [name]     Lumbar facet arthropathy 05/14/2016   Lumbar spinal stenosis 02/11/2018   Metatarsalgia of both feet 03/11/2017   Migraine 09/13/2012   Overview:  IMPRESSION: possible abd migraine   Mixed dyslipidemia 02/01/2019   Nausea & vomiting 07/07/2017   Nausea with vomiting, unspecified 09/14/2009    Qualifier: Diagnosis of  By: Shane Crutch, [name]   Formatting of this note might be different from the original. Overview:  Qualifier: Diagnosis of  By: Shane Crutch, [name]   Neuropathy    Nonspecific abnormal findings on radiological and examination of skull and head 09/13/2012   Overweight (BMI 25.0-29.9) 07/21/2014   POSITIVE PPD 07/30/2009   Annotation: CXR clear ?  diagnosed in 2/11?  On INH/pyridoxine Qualifier: Diagnosis of  By: Amil Amen MD, [name]     Pure hypercholesterolemia 11/27/2016   Retention of urine 06/25/2011   Right flank pain    TOBACCO ABUSE 09/21/2009   Qualifier: Diagnosis of  By: Amil Amen MD, Winona Legato of this note might be different from the original. Overview:  Qualifier: Diagnosis of  By: Amil Amen MD, Elinor Parkinson, SERUM, ELEVATED 09/21/2009   Qualifier: Diagnosis of  By: Amil Amen MD, [name]     Trigeminal neuralgia    Tuberculin test reaction 07/30/2009   Formatting of this note might  be different from the original. Overview:  Annotation: CXR clear ?  diagnosed in 2/11?  On INH/pyridoxine Qualifier: Diagnosis of  By: Amil Amen MD, [name]   Type 2 diabetes mellitus with hyperglycemia ([place]) 07/30/2009   Qualifier: Diagnosis of  By: Amil Amen MD, Winona Legato of this note might be different from the original. Overview:  Qualifier: Diagnosis of  By: Amil Amen MD, [name]   Type 2 diabetes mellitus with hyperglycemia, with long-term current use of insulin ([place]) 07/30/2009   Formatting of this note might be different from the original. Overview:  05/30/2015 A1C was 16.7% (!)  Overview:  05/30/2015 A1C was 16.7% (!) Formatting of this note might be different from the original. 05/30/2015 A1C was 16.7% (!)   Type 2 diabetes mellitus with stage 3 chronic kidney disease ([place]) 09/14/2009   Qualifier: Diagnosis of  By: Shane Crutch, [name]    ULCER-GASTRIC 09/28/2009   Qualifier: Diagnosis of  By: Chester Holstein NP, Paula     URINARY TRACT  INFECTION 09/21/2009   Qualifier: Diagnosis of  By: Amil Amen MD, Louanne Belton, CANDIDAL 12/25/2009   Qualifier: Diagnosis of  By: Amil Amen MD, Tessa Lerner D deficiency 04/25/2013    Past Surgical History:  Procedure Laterality Date   ABDOMINAL HYSTERECTOMY     partial   CHOLECYSTECTOMY      Outpatient Medications Prior to Visit  Medication Sig Dispense Refill   albuterol (VENTOLIN HFA) 108 (90 Base) MCG/ACT inhaler Inhale 2 puffs into the lungs every 6 (six) hours as needed for wheezing or shortness of breath. 18 g 2   amLODipine (NORVASC) 10 MG tablet Take 1 tablet (10 mg total) by mouth daily. 30 tablet 2   aspirin 81 MG tablet Take 81 mg by mouth daily.     atorvastatin (LIPITOR) 80 MG tablet Take 1 tablet by mouth once daily 60 tablet 0   azelastine (ASTELIN) 0.1 % nasal spray Place 2 sprays into both nostrils 2 (two) times daily. 30 mL 1   busPIRone (BUSPAR) 15 MG tablet Take 1 tablet (15 mg total) by mouth 2 (two) times daily. 60 tablet 3   carvedilol (COREG) 25 MG tablet Take 1 tablet (25 mg total) by mouth 2 (two) times daily with a meal. 180 tablet 0   Cholecalciferol (VITAMIN D3) 400 units CAPS Take 400 mg by mouth daily.     fluticasone (FLONASE) 50 MCG/ACT nasal spray Place 2 sprays into both nostrils daily. 16 g 1   fluticasone furoate-vilanterol (BREO ELLIPTA) 100-25 MCG/INH AEPB Inhale 1 puff into the lungs daily. 100 each 2   gabapentin (NEURONTIN) 300 MG capsule TAKE 2 CAPSULES BY MOUTH THREE TIMES DAILY 180 capsule 1   insulin degludec (TRESIBA) 100 UNIT/ML FlexTouch Pen Inject 40 Units into the skin daily at 10 pm. 100 mL 2   meloxicam (MOBIC) 7.5 MG tablet Take 1-2 tablets (7.5-15 mg total) by mouth daily. 60 tablet 0   metFORMIN (GLUCOPHAGE-XR) 500 MG 24 hr tablet Take 1 tablet (500 mg total) by mouth daily with breakfast. 60 tablet 2   methocarbamol (ROBAXIN) 750 MG tablet Take 750 mg by mouth every 8 (eight) hours as needed for muscle spasms.      nitroGLYCERIN (NITROSTAT) 0.4 MG SL tablet Place 0.4 mg under the tongue every 5 (five) minutes as needed for chest pain.     oxyCODONE (ROXICODONE) 5 MG immediate release tablet Take 1 tablet (5 mg total) by mouth every 6 (six) hours  as needed for severe pain. 30 tablet 0   promethazine (PHENERGAN) 12.5 MG tablet Take 12.5 mg by mouth every 8 (eight) hours as needed for nausea/vomiting.     rizatriptan (MAXALT-MLT) 10 MG disintegrating tablet Take 10 mg by mouth daily as needed for migraine.     sertraline (ZOLOFT) 100 MG tablet Take 1 tablet (100 mg total) by mouth daily. 30 tablet 3   spironolactone (ALDACTONE) 25 MG tablet Take 1 tablet by mouth once daily 30 tablet 0   topiramate (TOPAMAX) 50 MG tablet Take 1 tablet (50 mg total) by mouth 2 (two) times daily. 60 tablet 12   amLODipine (NORVASC) 5 MG tablet Take 1 tablet by mouth once daily (Patient not taking: Reported on 11/30/2020) 90 tablet 1   atorvastatin (LIPITOR) 80 MG tablet Take 1 tablet (80 mg total) by mouth daily. (Patient not taking: Reported on 11/30/2020) 30 tablet 11   cephALEXin (KEFLEX) 500 MG capsule Take 1 capsule (500 mg total) by mouth 2 (two) times daily. (Patient not taking: Reported on 11/30/2020) 14 capsule 0   FARXIGA 10 MG TABS tablet Take 1 tablet by mouth once daily (Patient not taking: Reported on 11/30/2020) 90 tablet 0   methocarbamol (ROBAXIN-750) 750 MG tablet 1 tab po q day prn lower back pain/spasms (Patient not taking: Reported on 11/30/2020) 30 tablet 0   ondansetron (ZOFRAN ODT) 4 MG disintegrating tablet Take 1 tablet (4 mg total) by mouth every 8 (eight) hours as needed for nausea or vomiting. (Patient not taking: Reported on 11/30/2020) 10 tablet 0   predniSONE (STERAPRED UNI-PAK 21 TAB) 10 MG (21) TBPK tablet Standard 6 day taper (Patient not taking: Reported on 11/30/2020) 21 tablet 0   promethazine (PHENERGAN) 12.5 MG tablet TAKE 1 TABLET BY MOUTH EVERY 8 HOURS AS NEEDED FOR NAUSEA FOR VOMITING  (Patient not taking: Reported on 11/30/2020) 12 tablet 3   No facility-administered medications prior to visit.    Allergies  Allergen Reactions   Insulin Aspart Other (See Comments) and Swelling    adverse reaction to Novolog    Latex Itching and Rash   Morphine Itching and Rash        Objective:    Physical Exam Vitals and nursing note reviewed.  Constitutional:      General: She is not in acute distress.    Appearance: Normal appearance.  HENT:     Head: Normocephalic.  Pulmonary:     Effort: No respiratory distress.  Musculoskeletal:     Cervical back: Normal range of motion.  Skin:    General: Skin is dry.     Coloration: Skin is not pale.  Neurological:     Mental Status: She is alert and oriented to person, place, and time.  Psychiatric:        Mood and Affect: Mood normal.    Ht 6' (1.829 m)   Wt 167 lb 1.7 oz (75.8 kg)   BMI 22.66 kg/m   Wt Readings from Last 3 Encounters:  11/30/20 167 lb 1.7 oz (75.8 kg)  10/30/20 167 lb (75.8 kg)  09/10/20 172 lb 6.4 oz (78.2 kg)       Assessment & Plan:   Problem List Items Addressed This Visit       Cardiovascular and Mediastinum   Migraine    Pt takes Topamax daily and has had to take a Maxalt daily since virus started. Advised adding the Robaxin she has bid, ok to cut in half during day if too  drowsy.        Respiratory   Viral upper respiratory tract infection    Advised to continue to take OTC meds, drink plenty of water. Call next week if sinus sx or headache are worse or no better.           I discussed the assessment and treatment plan with the patient. The patient was provided an opportunity to ask questions and all were answered. The patient agreed with the plan and demonstrated an understanding of the instructions.   The patient was advised to call back or seek an in-person evaluation if the symptoms worsen or if the condition fails to improve as anticipated.  I provided 21 minutes of  face-to-face time during this encounter.   Jeanie Sewer, NP [place] [phone] (phone) [phone] (fax)  Anton Ruiz

## 2020-11-30 NOTE — Assessment & Plan Note (Signed)
Advised to continue to take OTC meds, drink plenty of water. Call next week if sinus sx or headache are worse or no better.

## 2020-12-07 ENCOUNTER — Other Ambulatory Visit (INDEPENDENT_AMBULATORY_CARE_PROVIDER_SITE_OTHER): Payer: BC Managed Care – PPO

## 2020-12-07 ENCOUNTER — Other Ambulatory Visit: Payer: Self-pay

## 2020-12-07 ENCOUNTER — Telehealth (INDEPENDENT_AMBULATORY_CARE_PROVIDER_SITE_OTHER): Payer: BC Managed Care – PPO | Admitting: Family Medicine

## 2020-12-07 DIAGNOSIS — R300 Dysuria: Secondary | ICD-10-CM | POA: Diagnosis not present

## 2020-12-07 LAB — URINALYSIS, MICROSCOPIC ONLY

## 2020-12-07 MED ORDER — CEPHALEXIN 500 MG PO CAPS: 500.0000 mg | ORAL_CAPSULE | Freq: Two times a day (BID) | ORAL | 0 refills | Status: DC

## 2020-12-07 NOTE — Progress Notes (Signed)
Virtual Visit via audio. Note  Appointment scheduled at 1140.  Plan to call patient around 12,  but she needed to reschedule during her lunch hour at 1250.  I connected with [name] on 12/07/20 at 12:58PM by a video enabled telemedicine application and verified that I am speaking with the correct person using two identifiers. Her video would not connect.  Patient location: work  - in car.  My location: office - [place].    I discussed the limitations, risks, security and privacy concerns of performing an evaluation and management service by telephone and the availability of in person appointments. I also discussed with the patient that there may be a patient responsible charge related to this service. The patient expressed understanding and agreed to proceed, consent obtained  Chief complaint: Chief Complaint  Patient presents with   Dysuria    Pt reports pain with urination strong odor as well as dark color, denies OTC treatment, pt reports happens occasionally as she is diabetic     History of Present Illness: [name] is a 55 y.o. female  Dysuria Discomfort with urination, some odor and darker color recently. Similar symptoms in past with UTI. Has spots of blood past few days.  Started about 2 days ago. Few days of discoloration, but then odor started. Small amounts, with frequency.  Has not checked blood sugar. No abd pain. Some chronic back pain, some new discomfort in back.  Min nausea, takes promethazine chronically.   No fever, no vomiting, no abd pain.  No new sexual contacts.  No vaginal itching/discharge.   Urine culture 4 months ago positive for E. coli.  Pansensitive treated with keflex.  History of diabetes complicated by hyperglycemia, and does take SGLT2 with [place].  Dysuria discussed with PCP on 9/20 visit - neg nitrite LE. No abx. Similar sx's now but also having burning.    Lab Results  Component Value Date   HGBA1C 7.9 (H) 08/24/2020     Patient Active Problem List   Diagnosis Date Noted   Viral upper respiratory tract infection 11/30/2020   Trigeminal neuralgia    Cervical cancer (HCC)    Chronic back pain    Coronary artery disease    GERD (gastroesophageal reflux disease)    Hyperlipidemia    Hypertension    Mixed dyslipidemia 02/01/2019   Cardiac murmur 02/01/2019   Lumbar spinal stenosis 02/11/2018   Neuropathy 07/20/2017   Right flank pain    Nausea & vomiting 07/07/2017   Acute cystitis 07/07/2017   CAD (coronary artery disease) 07/07/2017   Metatarsalgia of both feet 03/11/2017   Diabetic gastroparesis associated with type 1 diabetes mellitus ([place]) 11/27/2016   Pure hypercholesterolemia 11/27/2016   Lumbar facet arthropathy 05/14/2016   Chronic pain syndrome 01/23/2016   Diabetic peripheral neuropathy ([place]) 01/23/2016   Anxiety 01/22/2016   History of cervical cancer 08/17/2015   Depression 08/10/2015   Overweight (BMI 25.0-29.9) 07/21/2014   Chronic kidney disease, stage III (moderate) ([place]) 05/23/2014   Diabetes type 2, uncontrolled 05/23/2014   Current use of insulin ([place]) 05/23/2014   Vitamin D deficiency 04/25/2013   Cerebrovascular disease 09/13/2012   Migraine 09/13/2012   Nonspecific abnormal findings on radiological and examination of skull and head 09/13/2012   Retention of urine 06/25/2011   ANKLE PAIN, LEFT 12/25/2009   DYSURIA 12/25/2009   VAGINITIS, CANDIDAL 12/25/2009   ULCER-GASTRIC 09/28/2009   DIABETIC PERIPHERAL NEUROPATHY 09/21/2009   TOBACCO ABUSE 09/21/2009   URINARY TRACT INFECTION 09/21/2009  INSOMNIA 09/21/2009   TRANSAMINASES, SERUM, ELEVATED 09/21/2009   Elevation of level of transaminase or lactic acid dehydrogenase (LDH) 09/21/2009   Type 2 diabetes mellitus with stage 3 chronic kidney disease ([place]) 09/14/2009   Nausea with vomiting, unspecified 09/14/2009   ABDOMINAL PAIN-EPIGASTRIC 09/14/2009   Cardiomyopathy, secondary ([place]) 09/06/2009   Cardiomyopathy,  unspecified ([place]) 09/06/2009   Essential hypertension 08/30/2009   DIABETES MELLITUS, TYPE II, UNCONTROLLED 07/30/2009   HLD (hyperlipidemia) 07/30/2009   GERD 07/30/2009   GASTROPARESIS 07/30/2009   CHEST PAIN, ATYPICAL 07/30/2009   POSITIVE PPD 07/30/2009   Type 2 diabetes mellitus with hyperglycemia ([place]) 07/30/2009   Gastric atony 07/30/2009   Gastroesophageal reflux disease 07/30/2009   Hyperlipidemia, unspecified 07/30/2009   Type 2 diabetes mellitus with hyperglycemia, with long-term current use of insulin ([place]) 07/30/2009   Tuberculin test reaction 07/30/2009   Past Medical History:  Diagnosis Date   ABDOMINAL PAIN-EPIGASTRIC 09/14/2009   Qualifier: Diagnosis of  By: Trellis Paganini PA-c, [name]    Acute cystitis 07/07/2017   ANKLE PAIN, LEFT 12/25/2009   Qualifier: Diagnosis of  By: Amil Amen MD, [name]     Anxiety 01/22/2016   CAD (coronary artery disease) 07/07/2017   Cardiac murmur 02/01/2019   Cardiomyopathy, secondary ([place]) 09/06/2009   Qualifier: Diagnosis of  By: Arvid Right   Formatting of this note might be different from the original. Overview:  Qualifier: Diagnosis of  By: Arvid Right   Cardiomyopathy, unspecified ([place]) 09/06/2009   Formatting of this note might be different from the original. Overview:  Overview:  Qualifier: Diagnosis of  By: Arvid Right  Overview:  Overview:  Qualifier: Diagnosis of  By: Arvid Right Formatting of this note might be different from the original. Overview:  Qualifier: Diagnosis of  By: Arvid Right   Cerebrovascular disease 09/13/2012   Cervical cancer ([place])    had surgery in 2001.   CHEST PAIN, ATYPICAL 07/30/2009   Qualifier: Diagnosis of  By: Amil Amen MD, Winona Legato of this note might be different from the original. Overview:  Qualifier: Diagnosis of  By: Amil Amen MD, [name]   Chronic back pain    Chronic kidney disease, stage III  (moderate) ([place]) 05/23/2014   Chronic pain syndrome 01/23/2016   Coronary artery disease    30% lesions noted   Current use of insulin ([place]) 05/23/2014   Depression    Diabetes mellitus    DIABETES MELLITUS, TYPE II, UNCONTROLLED 07/30/2009   Qualifier: Diagnosis of  By: Amil Amen MD, [name]     Diabetes type 2, uncontrolled 05/23/2014   Diabetic gastroparesis associated with type 1 diabetes mellitus ([place]) 11/27/2016   DIABETIC PERIPHERAL NEUROPATHY 09/21/2009   Qualifier: Diagnosis of  By: Amil Amen MD, [name]     Diabetic peripheral neuropathy ([place]) 01/23/2016   Dysuria 12/25/2009   Qualifier: Diagnosis of  By: Amil Amen MD, [name]     Elevation of level of transaminase or lactic acid dehydrogenase (LDH) 09/21/2009   Formatting of this note might be different from the original. Overview:  Qualifier: Diagnosis of  By: Amil Amen MD, [place]   Essential hypertension 08/30/2009   Qualifier: Diagnosis of  By: Lovette Cliche, CNA, Christy     Gastric atony 07/30/2009   Formatting of this note might be different from the original. Overview:  Qualifier: Diagnosis of  By: Amil Amen MD, [name]   Gastroesophageal reflux disease 07/30/2009   Formatting of this note might be different from the original. Overview:  Overview:  Qualifier: Diagnosis of  By: Amil Amen MD, Elby Showers: Diagnosis of  By: Chester Holstein NP, Ellwood Handler of this note might be different from the original. Overview:  Qualifier: Diagnosis of  By: Amil Amen MD, Elby Showers: Diagnosis of  By: Chester Holstein NP, Nevin Bloodgood   Gastroparesis 07/30/2009   Qualifier: Diagnosis of  By: Amil Amen MD, [place]     GERD 07/30/2009   Qualifier: Diagnosis of  By: Amil Amen MD, Elby Showers: Diagnosis of  By: Chester Holstein NP, Nevin Bloodgood     GERD (gastroesophageal reflux disease)    History of cervical cancer 08/17/2015   Status post partial hysterectomy  Formatting of this note might be different from the original. Status post partial hysterectomy    HLD (hyperlipidemia) 07/30/2009   Qualifier: Diagnosis of  By: Amil Amen MD, [name]     Hyperlipidemia    Hyperlipidemia, unspecified 07/30/2009   Formatting of this note might be different from the original. Overview:  Qualifier: Diagnosis of  By: Amil Amen MD, Winona Legato of this note might be different from the original. Overview:  Qualifier: Diagnosis of  By: Amil Amen MD, [place]   Hypertension    INSOMNIA 09/21/2009   Qualifier: Diagnosis of  By: Amil Amen MD, [name]     Lumbar facet arthropathy 05/14/2016   Lumbar spinal stenosis 02/11/2018   Metatarsalgia of both feet 03/11/2017   Migraine 09/13/2012   Overview:  IMPRESSION: possible abd migraine   Mixed dyslipidemia 02/01/2019   Nausea & vomiting 07/07/2017   Nausea with vomiting, unspecified 09/14/2009   Qualifier: Diagnosis of  By: Shane Crutch, [name]   Formatting of this note might be different from the original. Overview:  Qualifier: Diagnosis of  By: Shane Crutch, [name]   Neuropathy    Nonspecific abnormal findings on radiological and examination of skull and head 09/13/2012   Overweight (BMI 25.0-29.9) 07/21/2014   POSITIVE PPD 07/30/2009   Annotation: CXR clear ?  diagnosed in 2/11?  On INH/pyridoxine Qualifier: Diagnosis of  By: Amil Amen MD, [name]     Pure hypercholesterolemia 11/27/2016   Retention of urine 06/25/2011   Right flank pain    TOBACCO ABUSE 09/21/2009   Qualifier: Diagnosis of  By: Amil Amen MD, Winona Legato of this note might be different from the original. Overview:  Qualifier: Diagnosis of  By: Amil Amen MD, Elinor Parkinson, SERUM, ELEVATED 09/21/2009   Qualifier: Diagnosis of  By: Amil Amen MD, [name]     Trigeminal neuralgia    Tuberculin test reaction 07/30/2009   Formatting of this note might be different from the original. Overview:  Annotation: CXR clear ?  diagnosed in 2/11?  On INH/pyridoxine Qualifier: Diagnosis of  By: Amil Amen MD, [name]   Type 2 diabetes mellitus  with hyperglycemia ([place]) 07/30/2009   Qualifier: Diagnosis of  By: Amil Amen MD, Winona Legato of this note might be different from the original. Overview:  Qualifier: Diagnosis of  By: Amil Amen MD, [name]   Type 2 diabetes mellitus with hyperglycemia, with long-term current use of insulin ([place]) 07/30/2009   Formatting of this note might be different from the original. Overview:  05/30/2015 A1C was 16.7% (!)  Overview:  05/30/2015 A1C was 16.7% Kingsley Callander of this note might be different from the original. 05/30/2015 A1C was 16.7% (!)   Type 2 diabetes mellitus with stage 3 chronic kidney disease ([place]) 09/14/2009   Qualifier: Diagnosis of  By: Shane Crutch, [name]    ULCER-GASTRIC 09/28/2009  Qualifier: Diagnosis of  By: Chester Holstein NP, [place]     URINARY TRACT INFECTION 09/21/2009   Qualifier: Diagnosis of  By: Amil Amen MD, Louanne Belton, CANDIDAL 12/25/2009   Qualifier: Diagnosis of  By: Amil Amen MD, Tessa Lerner D deficiency 04/25/2013   Past Surgical History:  Procedure Laterality Date   ABDOMINAL HYSTERECTOMY     partial   CHOLECYSTECTOMY     Allergies  Allergen Reactions   Insulin Aspart Other (See Comments) and Swelling    adverse reaction to Novolog    Latex Itching and Rash   Morphine Itching and Rash   Prior to Admission medications   Medication Sig Start Date End Date Taking? Authorizing Provider  albuterol (VENTOLIN HFA) 108 (90 Base) MCG/ACT inhaler Inhale 2 puffs into the lungs every 6 (six) hours as needed for wheezing or shortness of breath. 08/03/20  Yes Saguier, Percell Miller, PA-C  amLODipine (NORVASC) 10 MG tablet Take 1 tablet (10 mg total) by mouth daily. 09/10/20  Yes Saguier, Percell Miller, PA-C  aspirin 81 MG tablet Take 81 mg by mouth daily.   Yes [provider]  atorvastatin (LIPITOR) 80 MG tablet Take 1 tablet by mouth once daily 08/06/20  Yes Saguier, Percell Miller, PA-C  azelastine (ASTELIN) 0.1 % nasal spray Place 2 sprays into both nostrils 2  (two) times daily. 04/09/20  Yes Paz, Alda Berthold, MD  busPIRone (BUSPAR) 15 MG tablet Take 1 tablet (15 mg total) by mouth 2 (two) times daily. 10/30/20  Yes Saguier, Percell Miller, PA-C  carvedilol (COREG) 25 MG tablet Take 1 tablet (25 mg total) by mouth 2 (two) times daily with a meal. 05/07/20  Yes Saguier, Percell Miller, PA-C  Cholecalciferol (VITAMIN D3) 400 units CAPS Take 400 mg by mouth daily.   Yes [provider]  fluticasone (FLONASE) 50 MCG/ACT nasal spray Place 2 sprays into both nostrils daily. 09/27/19  Yes Saguier, Percell Miller, PA-C  fluticasone furoate-vilanterol (BREO ELLIPTA) 100-25 MCG/INH AEPB Inhale 1 puff into the lungs daily. 08/03/20  Yes Saguier, Percell Miller, PA-C  gabapentin (NEURONTIN) 300 MG capsule TAKE 2 CAPSULES BY MOUTH THREE TIMES DAILY 10/12/20  Yes Saguier, Percell Miller, PA-C  insulin degludec (TRESIBA) 100 UNIT/ML FlexTouch Pen Inject 40 Units into the skin daily at 10 pm. 11/16/20  Yes Saguier, Percell Miller, PA-C  meloxicam (MOBIC) 7.5 MG tablet Take 1-2 tablets (7.5-15 mg total) by mouth daily. 08/30/20  Yes Saguier, Percell Miller, PA-C  metFORMIN (GLUCOPHAGE-XR) 500 MG 24 hr tablet Take 1 tablet (500 mg total) by mouth daily with breakfast. 10/30/20  Yes Saguier, Percell Miller, PA-C  methocarbamol (ROBAXIN) 750 MG tablet Take 750 mg by mouth every 8 (eight) hours as needed for muscle spasms.   Yes [provider]  nitroGLYCERIN (NITROSTAT) 0.4 MG SL tablet Place 0.4 mg under the tongue every 5 (five) minutes as needed for chest pain.   Yes [provider]  oxyCODONE (ROXICODONE) 5 MG immediate release tablet Take 1 tablet (5 mg total) by mouth every 6 (six) hours as needed for severe pain. 11/13/20  Yes Saguier, Percell Miller, PA-C  promethazine (PHENERGAN) 12.5 MG tablet Take 12.5 mg by mouth every 8 (eight) hours as needed for nausea/vomiting.   Yes [provider]  promethazine (PHENERGAN) 12.5 MG tablet Take 1 tablet (12.5 mg total) by mouth every 8 (eight) hours as needed for nausea or  vomiting. 11/30/20  Yes Jeanie Sewer, NP  rizatriptan (MAXALT-MLT) 10 MG disintegrating tablet Take 10 mg by mouth daily as needed for migraine.  04/17/20  Yes [provider]  sertraline (ZOLOFT) 100 MG tablet Take 1 tablet (100 mg total) by mouth daily. 08/03/20  Yes Saguier, Percell Miller, PA-C  spironolactone (ALDACTONE) 25 MG tablet Take 1 tablet by mouth once daily 07/21/19  Yes Saguier, Percell Miller, PA-C  topiramate (TOPAMAX) 50 MG tablet Take 1 tablet (50 mg total) by mouth 2 (two) times daily. 04/17/20  Yes Penumalli, Earlean Polka, MD  FARXIGA 10 MG TABS tablet Take 1 tablet by mouth once daily Patient not taking: No sig reported 07/21/19   Saguier, Percell Miller, PA-C   Social History   Socioeconomic History   Marital status: Single    Spouse name: Not on file   Number of children: Not on file   Years of education: Not on file   Highest education level: Not on file  Occupational History   Not on file  Tobacco Use   Smoking status: Never   Smokeless tobacco: Never  Vaping Use   Vaping Use: Never used  Substance and Sexual Activity   Alcohol use: Yes    Comment: occ   Drug use: No   Sexual activity: Not on file  Other Topics Concern   Not on file  Social History Narrative   Not on file   Social Determinants of Health   Financial Resource Strain: Not on file  Food Insecurity: Not on file  Transportation Needs: Not on file  Physical Activity: Not on file  Stress: Not on file  Social Connections: Not on file  Intimate Partner Violence: Not on file    Observations/Objective: There were no vitals filed for this visit. No distress on phone. normal responses, no respiratory distress.  All questions were answered with understanding plan expressed.  Assessment and Plan: Dysuria - Plan: POCT urinalysis dipstick, Urine Microscopic, Urine Culture, cephALEXin (KEFLEX) 500 MG capsule  -Ordered urine microscopic, urinalysis to be performed at lab, as well as urine culture, prescribed  Keflex.  Potential side effects and risks of meds discussed.  RTC precautions.  -Some increased risk of UTIs with diabetes as well as use of SGLT2.  If continued or frequent UTIs, may need to discuss med regimen with her PCP.  No changes for now.   Called on Monday, 12/10/2020 to check status, had not yet started antibiotic.  Discussed results of microscopy.  Culture pending.  Denies new symptoms, will start Keflex today.  Urgent care/RTC precautions given if fever, vomiting, new back pain.  Understanding expressed  Follow Up Instructions:  As needed with urgent care/ER precautions.  I discussed the assessment and treatment plan with the patient. The patient was provided an opportunity to ask questions and all were answered. The patient agreed with the plan and demonstrated an understanding of the instructions.   The patient was advised to call back or seek an in-person evaluation if the symptoms worsen or if the condition fails to improve as anticipated.  I provided 21 minutes of non-face-to-face time during this encounter, including chart review.    Wendie Agreste, MD

## 2020-12-07 NOTE — Patient Instructions (Signed)
Please check blood sugar to make sure that is not very high causing your symptoms, but less likely.  Please have urine test performed today after work -orders were placed.  Those can be performed at [facility].  I will review those results later, but if it does appear to be infection I did send antibiotic to your pharmacy.  If you continue to have urinary symptoms or recurrence of infection when discussed your current medications and possible further work-up with your primary care provider.

## 2020-12-10 ENCOUNTER — Other Ambulatory Visit: Payer: Self-pay | Admitting: Medical

## 2020-12-10 LAB — URINE CULTURE
MICRO NUMBER:: 12565758
SPECIMEN QUALITY:: ADEQUATE

## 2020-12-13 ENCOUNTER — Other Ambulatory Visit: Payer: Self-pay | Admitting: Medical

## 2020-12-13 ENCOUNTER — Other Ambulatory Visit: Payer: Self-pay | Admitting: Family Medicine

## 2020-12-13 DIAGNOSIS — R300 Dysuria: Secondary | ICD-10-CM

## 2020-12-17 ENCOUNTER — Other Ambulatory Visit: Payer: Self-pay | Admitting: Medical

## 2020-12-17 MED ORDER — MELOXICAM 7.5 MG PO TABS: 7.5000 mg | ORAL_TABLET | Freq: Every day | ORAL | 0 refills | Status: DC

## 2020-12-17 MED ORDER — PROMETHAZINE HCL 12.5 MG PO TABS: 12.5000 mg | ORAL_TABLET | Freq: Three times a day (TID) | ORAL | 0 refills | Status: DC | PRN

## 2020-12-17 MED ORDER — GABAPENTIN 300 MG PO CAPS: 600.0000 mg | ORAL_CAPSULE | Freq: Three times a day (TID) | ORAL | 0 refills | Status: DC

## 2020-12-17 MED ORDER — CARVEDILOL 25 MG PO TABS
25.0000 mg | ORAL_TABLET | Freq: Two times a day (BID) | ORAL | 0 refills | Status: DC
Start: 2020-12-17 — End: 2021-07-23

## 2020-12-17 MED ORDER — VITAMIN D3 10 MCG (400 UNIT) PO CAPS: 400.0000 mg | ORAL_CAPSULE | Freq: Every day | ORAL | 0 refills | Status: DC

## 2020-12-17 MED ORDER — OXYCODONE HCL 5 MG PO TABS
5.0000 mg | ORAL_TABLET | Freq: Four times a day (QID) | ORAL | 0 refills | Status: DC | PRN
Start: 2020-12-17 — End: 2021-01-15

## 2020-12-17 MED ORDER — METHOCARBAMOL 750 MG PO TABS: 750.0000 mg | ORAL_TABLET | Freq: Three times a day (TID) | ORAL | 0 refills | Status: DC | PRN

## 2020-12-17 NOTE — Telephone Encounter (Addendum)
Requesting: OXYCODONE Contract:09/27/19 UDS: 10/30/20  Last Visit: 10/30/20 Next Visit: NONE Last Refill: 11/13/20  Please Advise  Refilled oxycodone.  Mackie Pai, PA-C

## 2021-01-10 ENCOUNTER — Encounter: Payer: Self-pay | Admitting: Family Medicine

## 2021-01-10 ENCOUNTER — Telehealth (INDEPENDENT_AMBULATORY_CARE_PROVIDER_SITE_OTHER): Payer: BC Managed Care – PPO | Admitting: Family Medicine

## 2021-01-10 DIAGNOSIS — J329 Chronic sinusitis, unspecified: Secondary | ICD-10-CM

## 2021-01-10 DIAGNOSIS — B9689 Other specified bacterial agents as the cause of diseases classified elsewhere: Secondary | ICD-10-CM

## 2021-01-10 DIAGNOSIS — J111 Influenza due to unidentified influenza virus with other respiratory manifestations: Secondary | ICD-10-CM

## 2021-01-10 MED ORDER — AMOXICILLIN-POT CLAVULANATE 875-125 MG PO TABS: 1.0000 | ORAL_TABLET | Freq: Two times a day (BID) | ORAL | 0 refills | Status: DC

## 2021-01-10 NOTE — Progress Notes (Signed)
Virtual Visit via Video   I connected with patient on 01/10/21 at  2:30 PM EST by a video enabled telemedicine application and verified that I am speaking with the correct person using two identifiers.  Location patient: Home Location provider: [facility] Persons participating in the virtual visit: Patient, Provider, [place] [employer] [facility] C)  I discussed the limitations of evaluation and management by telemedicine and the availability of in person appointments. The patient expressed understanding and agreed to proceed.  Subjective:   HPI:   URI- pt reports 'cold or flu like sxs' that started last week.  Was unable to work on Thursday/Friday due to severe headache and excessive fatigue.  Pt has been taking OTC cough and cold medication but she continues to feel 'weak'.  + body aches.  + nasal and sinus congestion.  + tooth pain- L>R.  L ear pain.  Has not taken COVID test.  Skin is very sensitive.  ROS:   See pertinent positives and negatives per HPI.  Patient Active Problem List   Diagnosis Date Noted   Viral upper respiratory tract infection 11/30/2020   Trigeminal neuralgia    Cervical cancer (HCC)    Chronic back pain    Coronary artery disease    GERD (gastroesophageal reflux disease)    Hyperlipidemia    Hypertension    Mixed dyslipidemia 02/01/2019   Cardiac murmur 02/01/2019   Lumbar spinal stenosis 02/11/2018   Neuropathy 07/20/2017   Right flank pain    Nausea & vomiting 07/07/2017   Acute cystitis 07/07/2017   CAD (coronary artery disease) 07/07/2017   Metatarsalgia of both feet 03/11/2017   Diabetic gastroparesis associated with type 1 diabetes mellitus ([place]) 11/27/2016   Pure hypercholesterolemia 11/27/2016   Lumbar facet arthropathy 05/14/2016   Chronic pain syndrome 01/23/2016   Diabetic peripheral neuropathy ([place]) 01/23/2016   Anxiety 01/22/2016   History of cervical cancer 08/17/2015   Depression 08/10/2015   Overweight (BMI 25.0-29.9)  07/21/2014   Chronic kidney disease, stage III (moderate) ([place]) 05/23/2014   Diabetes type 2, uncontrolled 05/23/2014   Current use of insulin ([place]) 05/23/2014   Vitamin D deficiency 04/25/2013   Cerebrovascular disease 09/13/2012   Migraine 09/13/2012   Nonspecific abnormal findings on radiological and examination of skull and head 09/13/2012   Retention of urine 06/25/2011   ANKLE PAIN, LEFT 12/25/2009   DYSURIA 12/25/2009   VAGINITIS, CANDIDAL 12/25/2009   ULCER-GASTRIC 09/28/2009   DIABETIC PERIPHERAL NEUROPATHY 09/21/2009   TOBACCO ABUSE 09/21/2009   URINARY TRACT INFECTION 09/21/2009   INSOMNIA 09/21/2009   TRANSAMINASES, SERUM, ELEVATED 09/21/2009   Elevation of level of transaminase or lactic acid dehydrogenase (LDH) 09/21/2009   Type 2 diabetes mellitus with stage 3 chronic kidney disease ([place]) 09/14/2009   Nausea with vomiting, unspecified 09/14/2009   ABDOMINAL PAIN-EPIGASTRIC 09/14/2009   Cardiomyopathy, secondary ([place]) 09/06/2009   Cardiomyopathy, unspecified ([place]) 09/06/2009   Essential hypertension 08/30/2009   DIABETES MELLITUS, TYPE II, UNCONTROLLED 07/30/2009   HLD (hyperlipidemia) 07/30/2009   GERD 07/30/2009   GASTROPARESIS 07/30/2009   CHEST PAIN, ATYPICAL 07/30/2009   POSITIVE PPD 07/30/2009   Type 2 diabetes mellitus with hyperglycemia ([place]) 07/30/2009   Gastric atony 07/30/2009   Gastroesophageal reflux disease 07/30/2009   Hyperlipidemia, unspecified 07/30/2009   Type 2 diabetes mellitus with hyperglycemia, with long-term current use of insulin ([place]) 07/30/2009   Tuberculin test reaction 07/30/2009    Social History   Tobacco Use   Smoking status: Never   Smokeless tobacco: Never  Substance Use Topics   Alcohol use: Yes    Comment: occ    Current Outpatient Medications:    albuterol (VENTOLIN HFA) 108 (90 Base) MCG/ACT inhaler, Inhale 2 puffs into the lungs every 6 (six) hours as needed for wheezing or shortness of breath., Disp: 18 g, Rfl:  2   amLODipine (NORVASC) 10 MG tablet, Take 1 tablet (10 mg total) by mouth daily., Disp: 30 tablet, Rfl: 2   aspirin 81 MG tablet, Take 81 mg by mouth daily., Disp: , Rfl:    atorvastatin (LIPITOR) 80 MG tablet, Take 1 tablet by mouth once daily, Disp: 60 tablet, Rfl: 0   azelastine (ASTELIN) 0.1 % nasal spray, Place 2 sprays into both nostrils 2 (two) times daily., Disp: 30 mL, Rfl: 1   busPIRone (BUSPAR) 15 MG tablet, Take 1 tablet (15 mg total) by mouth 2 (two) times daily., Disp: 60 tablet, Rfl: 3   carvedilol (COREG) 25 MG tablet, Take 1 tablet (25 mg total) by mouth 2 (two) times daily with a meal., Disp: 180 tablet, Rfl: 0   cephALEXin (KEFLEX) 500 MG capsule, Take 1 capsule (500 mg total) by mouth 2 (two) times daily., Disp: 14 capsule, Rfl: 0   Cholecalciferol (VITAMIN D3) 10 MCG (400 UNIT) CAPS, Take 400 mg by mouth daily., Disp: 150 capsule, Rfl: 0   FARXIGA 10 MG TABS tablet, Take 1 tablet by mouth once daily, Disp: 90 tablet, Rfl: 0   fluticasone (FLONASE) 50 MCG/ACT nasal spray, Place 2 sprays into both nostrils daily., Disp: 16 g, Rfl: 1   fluticasone furoate-vilanterol (BREO ELLIPTA) 100-25 MCG/INH AEPB, Inhale 1 puff into the lungs daily., Disp: 100 each, Rfl: 2   gabapentin (NEURONTIN) 300 MG capsule, Take 2 capsules (600 mg total) by mouth 3 (three) times daily., Disp: 270 capsule, Rfl: 0   insulin degludec (TRESIBA) 100 UNIT/ML FlexTouch Pen, Inject 40 Units into the skin daily at 10 pm., Disp: 100 mL, Rfl: 2   meloxicam (MOBIC) 7.5 MG tablet, Take 1-2 tablets (7.5-15 mg total) by mouth daily., Disp: 60 tablet, Rfl: 0   metFORMIN (GLUCOPHAGE-XR) 500 MG 24 hr tablet, Take 1 tablet (500 mg total) by mouth daily with breakfast., Disp: 60 tablet, Rfl: 2   methocarbamol (ROBAXIN) 750 MG tablet, Take 1 tablet (750 mg total) by mouth every 8 (eight) hours as needed for muscle spasms., Disp: 30 tablet, Rfl: 0   nitroGLYCERIN (NITROSTAT) 0.4 MG SL tablet, Place 0.4 mg under the tongue  every 5 (five) minutes as needed for chest pain., Disp: , Rfl:    oxyCODONE (ROXICODONE) 5 MG immediate release tablet, Take 1 tablet (5 mg total) by mouth every 6 (six) hours as needed for severe pain., Disp: 30 tablet, Rfl: 0   promethazine (PHENERGAN) 12.5 MG tablet, Take 1 tablet (12.5 mg total) by mouth every 8 (eight) hours as needed for nausea or vomiting., Disp: 12 tablet, Rfl: 0   promethazine (PHENERGAN) 12.5 MG tablet, Take 1 tablet (12.5 mg total) by mouth every 8 (eight) hours as needed., Disp: 30 tablet, Rfl: 0   rizatriptan (MAXALT-MLT) 10 MG disintegrating tablet, Take 10 mg by mouth daily as needed for migraine., Disp: , Rfl:    sertraline (ZOLOFT) 100 MG tablet, Take 1 tablet (100 mg total) by mouth daily., Disp: 30 tablet, Rfl: 3   spironolactone (ALDACTONE) 25 MG tablet, Take 1 tablet by mouth once daily, Disp: 30 tablet, Rfl: 0   topiramate (TOPAMAX) 50 MG tablet, Take 1 tablet (50  mg total) by mouth 2 (two) times daily., Disp: 60 tablet, Rfl: 12  Allergies  Allergen Reactions   Insulin Aspart Other (See Comments) and Swelling    adverse reaction to Novolog    Latex Itching and Rash   Morphine Itching and Rash    Objective:   There were no vitals taken for this visit. AAOx3, NAD NCAT, EOMI Swelling of L maxillary sinus No obvious CN deficits Coloring WNL Pt is able to speak clearly, coherently without shortness of breath or increased work of breathing.  Thought process is linear.  Mood is appropriate.   Assessment and Plan:   Bacterial sinusitis- new.  Given pt's underlying diabetes, there is greater concern for bacterial infxn.  Pt's recent facial swelling, pain, and tooth pain are consistent w/ maxillary sinusitis.  Start Augmentin.  Pt expressed understanding and is in agreement w/ plan.   ILI- new.  Pt's fatigue, body aches, subjective fevers w/ chills, and skin sensitivity are consistent w/ flu like illness.  Unfortunately she is outside the Tamiflu window  so we will treat supportively w/ fluids, rest, Tylenol.  Pt is in agreement.  Note provided for work.   Annye Asa, MD 01/10/2021

## 2021-01-15 ENCOUNTER — Telehealth: Payer: Self-pay | Admitting: Medical

## 2021-01-15 MED ORDER — OXYCODONE HCL 5 MG PO TABS
5.0000 mg | ORAL_TABLET | Freq: Four times a day (QID) | ORAL | 0 refills | Status: DC | PRN
Start: 2021-01-15 — End: 2021-03-21

## 2021-01-15 MED ORDER — METFORMIN HCL ER 500 MG PO TB24: 500.0000 mg | ORAL_TABLET | Freq: Every day | ORAL | 2 refills | Status: DC

## 2021-01-15 MED ORDER — MELOXICAM 7.5 MG PO TABS
7.5000 mg | ORAL_TABLET | Freq: Every day | ORAL | 0 refills | Status: DC
Start: 2021-01-15 — End: 2021-03-21

## 2021-01-15 NOTE — Telephone Encounter (Signed)
Requesting: oxycodone Contract:  UDS:10/30/20 Last Visit: 10/30/20 Next Visit: none Last Refill: 12/17/20  Please Advise

## 2021-01-15 NOTE — Telephone Encounter (Signed)
Rx oxycodone sent to pt pharmacy. Up to date on contract and uds. Due for refill.  Mackie Pai, PA-C

## 2021-01-15 NOTE — Telephone Encounter (Signed)
Medication:  1.meloxicam (MOBIC) 7.5 MG tablet 2.metFORMIN (GLUCOPHAGE-XR) 500 MG 24 hr tablet 3.oxyCODONE (ROXICODONE) 5 MG immediate release tablet   Has the patient contacted their pharmacy? No. (If no, request that the patient contact the pharmacy for the refill.) (If yes, when and what did the pharmacy advise?)  Preferred Pharmacy (with phone number or street name):  [facility], [place] [address]  Phone:  [phone]  Fax:  [phone]  Agent: Please be advised that RX refills may take up to 3 business days. We ask that you follow-up with your pharmacy.

## 2021-01-16 NOTE — Telephone Encounter (Signed)
Pt called and lvm to return call to schedule

## 2021-01-17 ENCOUNTER — Encounter (HOSPITAL_BASED_OUTPATIENT_CLINIC_OR_DEPARTMENT_OTHER): Payer: Self-pay

## 2021-01-17 ENCOUNTER — Emergency Department (HOSPITAL_BASED_OUTPATIENT_CLINIC_OR_DEPARTMENT_OTHER): Payer: Self-pay

## 2021-01-17 ENCOUNTER — Emergency Department (HOSPITAL_BASED_OUTPATIENT_CLINIC_OR_DEPARTMENT_OTHER)
Admission: EM | Admit: 2021-01-17 | Discharge: 2021-01-17 | Disposition: A | Discharge status: Home / Self Care | Payer: Self-pay | Attending: Student | Admitting: Student

## 2021-01-17 ENCOUNTER — Other Ambulatory Visit: Payer: Self-pay

## 2021-01-17 DIAGNOSIS — Z91040 Latex allergy status: Secondary | ICD-10-CM | POA: Insufficient documentation

## 2021-01-17 DIAGNOSIS — I10 Essential (primary) hypertension: Secondary | ICD-10-CM | POA: Insufficient documentation

## 2021-01-17 DIAGNOSIS — Z79899 Other long term (current) drug therapy: Secondary | ICD-10-CM | POA: Insufficient documentation

## 2021-01-17 DIAGNOSIS — I2510 Atherosclerotic heart disease of native coronary artery without angina pectoris: Secondary | ICD-10-CM | POA: Insufficient documentation

## 2021-01-17 DIAGNOSIS — Z7982 Long term (current) use of aspirin: Secondary | ICD-10-CM | POA: Insufficient documentation

## 2021-01-17 DIAGNOSIS — E1140 Type 2 diabetes mellitus with diabetic neuropathy, unspecified: Secondary | ICD-10-CM | POA: Insufficient documentation

## 2021-01-17 DIAGNOSIS — M25552 Pain in left hip: Secondary | ICD-10-CM | POA: Insufficient documentation

## 2021-01-17 DIAGNOSIS — Z794 Long term (current) use of insulin: Secondary | ICD-10-CM | POA: Insufficient documentation

## 2021-01-17 DIAGNOSIS — Z7984 Long term (current) use of oral hypoglycemic drugs: Secondary | ICD-10-CM | POA: Insufficient documentation

## 2021-01-17 LAB — CBC WITH DIFFERENTIAL/PLATELET
Abs Immature Granulocytes: 0.01 10*3/uL (ref 0.00–0.07)
Basophils Absolute: 0 10*3/uL (ref 0.0–0.1)
Basophils Relative: 0 %
Eosinophils Absolute: 0.3 10*3/uL (ref 0.0–0.5)
Eosinophils Relative: 5 %
HCT: 40.7 % (ref 36.0–46.0)
Hemoglobin: 13.9 g/dL (ref 12.0–15.0)
Immature Granulocytes: 0 %
Lymphocytes Relative: 28 %
Lymphs Abs: 1.8 10*3/uL (ref 0.7–4.0)
MCH: 29.5 pg (ref 26.0–34.0)
MCHC: 34.2 g/dL (ref 30.0–36.0)
MCV: 86.4 fL (ref 80.0–100.0)
Monocytes Absolute: 0.5 10*3/uL (ref 0.1–1.0)
Monocytes Relative: 8 %
Neutro Abs: 3.9 10*3/uL (ref 1.7–7.7)
Neutrophils Relative %: 59 %
Platelets: 225 10*3/uL (ref 150–400)
RBC: 4.71 MIL/uL (ref 3.87–5.11)
RDW: 12.9 % (ref 11.5–15.5)
WBC: 6.6 10*3/uL (ref 4.0–10.5)
nRBC: 0 % (ref 0.0–0.2)

## 2021-01-17 LAB — COMPREHENSIVE METABOLIC PANEL
ALT: 19 U/L (ref 0–44)
AST: 16 U/L (ref 15–41)
Albumin: 3.8 g/dL (ref 3.5–5.0)
Alkaline Phosphatase: 96 U/L (ref 38–126)
Anion gap: 8 (ref 5–15)
BUN: 20 mg/dL (ref 6–20)
CO2: 28 mmol/L (ref 22–32)
Calcium: 9 mg/dL (ref 8.9–10.3)
Chloride: 98 mmol/L (ref 98–111)
Creatinine, Ser: 1.23 mg/dL — ABNORMAL HIGH (ref 0.44–1.00)
GFR, Estimated: 52 mL/min — ABNORMAL LOW (ref >60)
Glucose, Bld: 518 mg/dL (ref 70–99)
Potassium: 4.7 mmol/L (ref 3.5–5.1)
Sodium: 134 mmol/L — ABNORMAL LOW (ref 135–145)
Total Bilirubin: 0.7 mg/dL (ref 0.3–1.2)
Total Protein: 7.7 g/dL (ref 6.5–8.1)

## 2021-01-17 LAB — CBG MONITORING, ED: Glucose-Capillary: 401 mg/dL — ABNORMAL HIGH (ref 70–99)

## 2021-01-17 LAB — LIPASE, BLOOD: Lipase: 58 U/L — ABNORMAL HIGH (ref 11–51)

## 2021-01-17 MED ORDER — IOHEXOL 300 MG/ML  SOLN
100.0000 mL | Freq: Once | INTRAMUSCULAR | Status: AC | PRN
  Administered 2021-01-17: 100 mL via INTRAVENOUS

## 2021-01-17 MED ORDER — FENTANYL CITRATE PF 50 MCG/ML IJ SOSY
50.0000 ug | PREFILLED_SYRINGE | Freq: Once | INTRAMUSCULAR | Status: AC
  Administered 2021-01-17: 50 ug via INTRAVENOUS
  Filled 2021-01-17: qty 1

## 2021-01-17 MED ORDER — KETOROLAC TROMETHAMINE 15 MG/ML IJ SOLN
15.0000 mg | Freq: Once | INTRAMUSCULAR | Status: AC
  Administered 2021-01-17: 15 mg via INTRAVENOUS
  Filled 2021-01-17: qty 1

## 2021-01-17 MED ORDER — SODIUM CHLORIDE 0.9 % IV BOLUS
1000.0000 mL | Freq: Once | INTRAVENOUS | Status: AC
  Administered 2021-01-17: 1000 mL via INTRAVENOUS

## 2021-01-17 MED ORDER — ONDANSETRON HCL 4 MG/2ML IJ SOLN
4.0000 mg | Freq: Once | INTRAMUSCULAR | Status: AC
  Administered 2021-01-17: 4 mg via INTRAVENOUS
  Filled 2021-01-17: qty 2

## 2021-01-17 NOTE — Telephone Encounter (Signed)
Called pt and lvm to return call.  

## 2021-01-17 NOTE — Discharge Instructions (Signed)
You were seen in the ER today for your hip pain.  Your work-up was very reassuring.  Suspect you have bursitis which is inflammation around the joint.  You may take a very low dose of ibuprofen as needed as well as Tylenol.  He may ice the area.  Additionally your blood sugar was quite high.  Please take the remaining dosage of your insulin as prescribed at home as well as the metformin you have not given your refill for from your primary care doctor.  Return to the ER with any new numbness, tingling, weakness in the leg, swelling, fevers, chills, or any other new severe symptom.

## 2021-01-17 NOTE — ED Provider Notes (Signed)
[facility] Provider Note   CSN: [id number] Arrival date & time: 01/17/21  4580     History Chief Complaint  Patient presents with   Hip Pain    [name] is a 55 y.o. female who presents with concern for 4 days of progressively worsening left hip pain and sensation that it is swollen.  Painful with direct pressure applied to the hip pain when walking but not with passive range of motion of the hip.  Denies any fevers or chills at home, denies any recent procedures to the area, any recent prolonged immobilization or surgeries. No history of recent trauma.  She denies any numbness, tingling or weakness in her leg.  She does history of type 2 diabetes that is insulin-dependent, gastroparesis with as needed promethazine at home, hypertension, and history of cervical cancer.  HPI     Past Medical History:  Diagnosis Date   ABDOMINAL PAIN-EPIGASTRIC 09/14/2009   Qualifier: Diagnosis of  By: Trellis Paganini PA-c, [name]    Acute cystitis 07/07/2017   ANKLE PAIN, LEFT 12/25/2009   Qualifier: Diagnosis of  By: Amil Amen MD, [name]     Anxiety 01/22/2016   CAD (coronary artery disease) 07/07/2017   Cardiac murmur 02/01/2019   Cardiomyopathy, secondary ([place]) 09/06/2009   Qualifier: Diagnosis of  By: Arvid Right   Formatting of this note might be different from the original. Overview:  Qualifier: Diagnosis of  By: Arvid Right   Cardiomyopathy, unspecified ([place]) 09/06/2009   Formatting of this note might be different from the original. Overview:  Overview:  Qualifier: Diagnosis of  By: Arvid Right  Overview:  Overview:  Qualifier: Diagnosis of  By: Arvid Right Formatting of this note might be different from the original. Overview:  Qualifier: Diagnosis of  By: Arvid Right   Cerebrovascular disease 09/13/2012   Cervical cancer ([place])    had surgery in 2001.   CHEST PAIN,  ATYPICAL 07/30/2009   Qualifier: Diagnosis of  By: Amil Amen MD, Winona Legato of this note might be different from the original. Overview:  Qualifier: Diagnosis of  By: Amil Amen MD, [name]   Chronic back pain    Chronic kidney disease, stage III (moderate) ([place]) 05/23/2014   Chronic pain syndrome 01/23/2016   Coronary artery disease    30% lesions noted   Current use of insulin ([place]) 05/23/2014   Depression    Diabetes mellitus    DIABETES MELLITUS, TYPE II, UNCONTROLLED 07/30/2009   Qualifier: Diagnosis of  By: Amil Amen MD, [name]     Diabetes type 2, uncontrolled 05/23/2014   Diabetic gastroparesis associated with type 1 diabetes mellitus ([place]) 11/27/2016   DIABETIC PERIPHERAL NEUROPATHY 09/21/2009   Qualifier: Diagnosis of  By: Amil Amen MD, [name]     Diabetic peripheral neuropathy ([place]) 01/23/2016   Dysuria 12/25/2009   Qualifier: Diagnosis of  By: Amil Amen MD, [name]     Elevation of level of transaminase or lactic acid dehydrogenase (LDH) 09/21/2009   Formatting of this note might be different from the original. Overview:  Qualifier: Diagnosis of  By: Amil Amen MD, [place]   Essential hypertension 08/30/2009   Qualifier: Diagnosis of  By: Lovette Cliche, CNA, Christy     Gastric atony 07/30/2009   Formatting of this note might be different from the original. Overview:  Qualifier: Diagnosis of  By: Amil Amen MD, [name]   Gastroesophageal reflux disease 07/30/2009   Formatting of this note might be  different from the original. Overview:  Overview:  Qualifier: Diagnosis of  By: Amil Amen MD, Elby Showers: Diagnosis of  By: Chester Holstein NP, Ellwood Handler of this note might be different from the original. Overview:  Qualifier: Diagnosis of  By: Amil Amen MD, Elby Showers: Diagnosis of  By: Chester Holstein NP, Nevin Bloodgood   Gastroparesis 07/30/2009   Qualifier: Diagnosis of  By: Amil Amen MD, [place]     GERD 07/30/2009   Qualifier: Diagnosis of  By: Amil Amen MD, Elby Showers: Diagnosis of  By: Chester Holstein NP, Nevin Bloodgood     GERD (gastroesophageal reflux disease)    History of cervical cancer 08/17/2015   Status post partial hysterectomy  Formatting of this note might be different from the original. Status post partial hysterectomy   HLD (hyperlipidemia) 07/30/2009   Qualifier: Diagnosis of  By: Amil Amen MD, [name]     Hyperlipidemia    Hyperlipidemia, unspecified 07/30/2009   Formatting of this note might be different from the original. Overview:  Qualifier: Diagnosis of  By: Amil Amen MD, Winona Legato of this note might be different from the original. Overview:  Qualifier: Diagnosis of  By: Amil Amen MD, [name]   Hypertension    INSOMNIA 09/21/2009   Qualifier: Diagnosis of  By: Amil Amen MD, [name]     Lumbar facet arthropathy 05/14/2016   Lumbar spinal stenosis 02/11/2018   Metatarsalgia of both feet 03/11/2017   Migraine 09/13/2012   Overview:  IMPRESSION: possible abd migraine   Mixed dyslipidemia 02/01/2019   Nausea & vomiting 07/07/2017   Nausea with vomiting, unspecified 09/14/2009   Qualifier: Diagnosis of  By: Shane Crutch, [name]   Formatting of this note might be different from the original. Overview:  Qualifier: Diagnosis of  By: Shane Crutch, [name]   Neuropathy    Nonspecific abnormal findings on radiological and examination of skull and head 09/13/2012   Overweight (BMI 25.0-29.9) 07/21/2014   POSITIVE PPD 07/30/2009   Annotation: CXR clear ?  diagnosed in 2/11?  On INH/pyridoxine Qualifier: Diagnosis of  By: Amil Amen MD, [name]     Pure hypercholesterolemia 11/27/2016   Retention of urine 06/25/2011   Right flank pain    TOBACCO ABUSE 09/21/2009   Qualifier: Diagnosis of  By: Amil Amen MD, Winona Legato of this note might be different from the original. Overview:  Qualifier: Diagnosis of  By: Amil Amen MD, Elinor Parkinson, SERUM, ELEVATED 09/21/2009   Qualifier: Diagnosis of  By: Amil Amen MD, [name]     Trigeminal  neuralgia    Tuberculin test reaction 07/30/2009   Formatting of this note might be different from the original. Overview:  Annotation: CXR clear ?  diagnosed in 2/11?  On INH/pyridoxine Qualifier: Diagnosis of  By: Amil Amen MD, [name]   Type 2 diabetes mellitus with hyperglycemia ([place]) 07/30/2009   Qualifier: Diagnosis of  By: Amil Amen MD, Winona Legato of this note might be different from the original. Overview:  Qualifier: Diagnosis of  By: Amil Amen MD, [name]   Type 2 diabetes mellitus with hyperglycemia, with long-term current use of insulin ([place]) 07/30/2009   Formatting of this note might be different from the original. Overview:  05/30/2015 A1C was 16.7% (!)  Overview:  05/30/2015 A1C was 16.7% (!) Formatting of this note might be different from the original. 05/30/2015 A1C was 16.7% (!)   Type 2 diabetes mellitus with stage 3 chronic kidney disease ([place]) 09/14/2009   Qualifier: Diagnosis of  By: Shane Crutch, [name]  ULCER-GASTRIC 09/28/2009   Qualifier: Diagnosis of  By: Chester Holstein NP, Nevin Bloodgood     URINARY TRACT INFECTION 09/21/2009   Qualifier: Diagnosis of  By: Amil Amen MD, Louanne Belton, CANDIDAL 12/25/2009   Qualifier: Diagnosis of  By: Amil Amen MD, Tessa Lerner D deficiency 04/25/2013    Patient Active Problem List   Diagnosis Date Noted   Viral upper respiratory tract infection 11/30/2020   Trigeminal neuralgia    Cervical cancer (HCC)    Chronic back pain    Coronary artery disease    GERD (gastroesophageal reflux disease)    Hyperlipidemia    Hypertension    Mixed dyslipidemia 02/01/2019   Cardiac murmur 02/01/2019   Lumbar spinal stenosis 02/11/2018   Neuropathy 07/20/2017   Right flank pain    Nausea & vomiting 07/07/2017   Acute cystitis 07/07/2017   CAD (coronary artery disease) 07/07/2017   Metatarsalgia of both feet 03/11/2017   Diabetic gastroparesis associated with type 1 diabetes mellitus ([place]) 11/27/2016   Pure hypercholesterolemia  11/27/2016   Lumbar facet arthropathy 05/14/2016   Chronic pain syndrome 01/23/2016   Diabetic peripheral neuropathy ([place]) 01/23/2016   Anxiety 01/22/2016   History of cervical cancer 08/17/2015   Depression 08/10/2015   Overweight (BMI 25.0-29.9) 07/21/2014   Chronic kidney disease, stage III (moderate) ([place]) 05/23/2014   Diabetes type 2, uncontrolled 05/23/2014   Current use of insulin ([place]) 05/23/2014   Vitamin D deficiency 04/25/2013   Cerebrovascular disease 09/13/2012   Migraine 09/13/2012   Nonspecific abnormal findings on radiological and examination of skull and head 09/13/2012   Retention of urine 06/25/2011   ANKLE PAIN, LEFT 12/25/2009   DYSURIA 12/25/2009   VAGINITIS, CANDIDAL 12/25/2009   ULCER-GASTRIC 09/28/2009   DIABETIC PERIPHERAL NEUROPATHY 09/21/2009   TOBACCO ABUSE 09/21/2009   URINARY TRACT INFECTION 09/21/2009   INSOMNIA 09/21/2009   TRANSAMINASES, SERUM, ELEVATED 09/21/2009   Elevation of level of transaminase or lactic acid dehydrogenase (LDH) 09/21/2009   Type 2 diabetes mellitus with stage 3 chronic kidney disease ([place]) 09/14/2009   Nausea with vomiting, unspecified 09/14/2009   ABDOMINAL PAIN-EPIGASTRIC 09/14/2009   Cardiomyopathy, secondary ([place]) 09/06/2009   Cardiomyopathy, unspecified ([place]) 09/06/2009   Essential hypertension 08/30/2009   DIABETES MELLITUS, TYPE II, UNCONTROLLED 07/30/2009   HLD (hyperlipidemia) 07/30/2009   GERD 07/30/2009   GASTROPARESIS 07/30/2009   CHEST PAIN, ATYPICAL 07/30/2009   POSITIVE PPD 07/30/2009   Type 2 diabetes mellitus with hyperglycemia ([place]) 07/30/2009   Gastric atony 07/30/2009   Gastroesophageal reflux disease 07/30/2009   Hyperlipidemia, unspecified 07/30/2009   Type 2 diabetes mellitus with hyperglycemia, with long-term current use of insulin ([place]) 07/30/2009   Tuberculin test reaction 07/30/2009    Past Surgical History:  Procedure Laterality Date   ABDOMINAL HYSTERECTOMY     partial    CHOLECYSTECTOMY       OB History   No obstetric history on file.     Family History  Problem Relation Age of Onset   Diabetes Mother    Diabetes Father     Social History   Tobacco Use   Smoking status: Never   Smokeless tobacco: Never  Vaping Use   Vaping Use: Never used  Substance Use Topics   Alcohol use: Yes    Comment: occ   Drug use: No    Home Medications Prior to Admission medications   Medication Sig Start Date End Date Taking? Authorizing Provider  albuterol (VENTOLIN HFA) 108 (90 Base) MCG/ACT inhaler  Inhale 2 puffs into the lungs every 6 (six) hours as needed for wheezing or shortness of breath. 08/03/20   Saguier, Percell Miller, PA-C  amLODipine (NORVASC) 10 MG tablet Take 1 tablet (10 mg total) by mouth daily. 09/10/20   Saguier, Percell Miller, PA-C  amoxicillin-clavulanate (AUGMENTIN) 875-125 MG tablet Take 1 tablet by mouth 2 (two) times daily. 01/10/21   Midge Minium, MD  aspirin 81 MG tablet Take 81 mg by mouth daily.    [provider]  atorvastatin (LIPITOR) 80 MG tablet Take 1 tablet by mouth once daily 08/06/20   Saguier, Percell Miller, PA-C  azelastine (ASTELIN) 0.1 % nasal spray Place 2 sprays into both nostrils 2 (two) times daily. 04/09/20   Colon Branch, MD  busPIRone (BUSPAR) 15 MG tablet Take 1 tablet (15 mg total) by mouth 2 (two) times daily. 10/30/20   Saguier, Percell Miller, PA-C  carvedilol (COREG) 25 MG tablet Take 1 tablet (25 mg total) by mouth 2 (two) times daily with a meal. 12/17/20   Saguier, Percell Miller, PA-C  Cholecalciferol (VITAMIN D3) 10 MCG (400 UNIT) CAPS Take 400 mg by mouth daily. 12/17/20   Saguier, Percell Miller, PA-C  FARXIGA 10 MG TABS tablet Take 1 tablet by mouth once daily 07/21/19   Saguier, Percell Miller, PA-C  fluticasone [facility]) 50 MCG/ACT nasal spray Place 2 sprays into both nostrils daily. 09/27/19   Saguier, Percell Miller, PA-C  fluticasone furoate-vilanterol (BREO ELLIPTA) 100-25 MCG/INH AEPB Inhale 1 puff into the lungs daily. 08/03/20   Saguier, Percell Miller, PA-C   gabapentin (NEURONTIN) 300 MG capsule Take 2 capsules (600 mg total) by mouth 3 (three) times daily. 12/17/20   Saguier, Percell Miller, PA-C  insulin degludec (TRESIBA) 100 UNIT/ML FlexTouch Pen Inject 40 Units into the skin daily at 10 pm. 11/16/20   Saguier, Percell Miller, PA-C  meloxicam (MOBIC) 7.5 MG tablet Take 1-2 tablets (7.5-15 mg total) by mouth daily. 01/15/21   Saguier, Percell Miller, PA-C  metFORMIN (GLUCOPHAGE-XR) 500 MG 24 hr tablet Take 1 tablet (500 mg total) by mouth daily with breakfast. 01/15/21   Saguier, Percell Miller, PA-C  methocarbamol (ROBAXIN) 750 MG tablet Take 1 tablet (750 mg total) by mouth every 8 (eight) hours as needed for muscle spasms. 12/17/20   Saguier, Percell Miller, PA-C  nitroGLYCERIN (NITROSTAT) 0.4 MG SL tablet Place 0.4 mg under the tongue every 5 (five) minutes as needed for chest pain.    [provider]  oxyCODONE (ROXICODONE) 5 MG immediate release tablet Take 1 tablet (5 mg total) by mouth every 6 (six) hours as needed for severe pain. 01/15/21   Saguier, Percell Miller, PA-C  promethazine (PHENERGAN) 12.5 MG tablet Take 1 tablet (12.5 mg total) by mouth every 8 (eight) hours as needed for nausea or vomiting. 11/30/20   Jeanie Sewer, NP  promethazine (PHENERGAN) 12.5 MG tablet Take 1 tablet (12.5 mg total) by mouth every 8 (eight) hours as needed. 12/17/20   Saguier, Percell Miller, PA-C  rizatriptan (MAXALT-MLT) 10 MG disintegrating tablet Take 10 mg by mouth daily as needed for migraine. 04/17/20   [provider]  sertraline (ZOLOFT) 100 MG tablet Take 1 tablet (100 mg total) by mouth daily. 08/03/20   Saguier, Percell Miller, PA-C  spironolactone (ALDACTONE) 25 MG tablet Take 1 tablet by mouth once daily 07/21/19   Saguier, Percell Miller, PA-C  topiramate (TOPAMAX) 50 MG tablet Take 1 tablet (50 mg total) by mouth 2 (two) times daily. 04/17/20   Penumalli, Earlean Polka, MD    Allergies    Insulin aspart, Latex, and Morphine  Review  of Systems   Review of Systems  Constitutional: Negative.   HENT:  Negative.    Eyes: Negative.   Respiratory: Negative.    Cardiovascular: Negative.   Gastrointestinal: Negative.   Genitourinary: Negative.   Musculoskeletal:  Positive for joint swelling and myalgias.  Skin: Negative.   Neurological: Negative.   Hematological: Negative.    Physical Exam Updated Vital Signs BP (!) 143/87   Pulse 80   Temp 97.7 F (36.5 C) (Oral)   Resp 17   Ht 6' (1.829 m)   Wt 86.2 kg   SpO2 99%   BMI 25.77 kg/m   Physical Exam Vitals and nursing note reviewed.  Constitutional:      Appearance: She is not ill-appearing or toxic-appearing.  HENT:     Head: Normocephalic and atraumatic.     Nose: Nose normal. No congestion.     Mouth/Throat:     Mouth: Mucous membranes are moist.     Pharynx: No oropharyngeal exudate or posterior oropharyngeal erythema.  Eyes:     General:        Right eye: No discharge.        Left eye: No discharge.     Extraocular Movements: Extraocular movements intact.     Conjunctiva/sclera: Conjunctivae normal.     Pupils: Pupils are equal, round, and reactive to light.  Cardiovascular:     Rate and Rhythm: Normal rate and regular rhythm.     Pulses: Normal pulses.     Heart sounds: Normal heart sounds. No murmur heard. Pulmonary:     Effort: Pulmonary effort is normal. No respiratory distress.     Breath sounds: Normal breath sounds. No wheezing or rales.  Abdominal:     General: Bowel sounds are normal. There is no distension.     Palpations: Abdomen is soft.     Tenderness: There is no abdominal tenderness. There is no right CVA tenderness, left CVA tenderness, guarding or rebound.  Musculoskeletal:        General: No deformity.     Cervical back: Neck supple.     Right hip: Normal.     Left hip: Tenderness and bony tenderness present. No crepitus. Normal range of motion.     Right upper leg: Normal.     Left upper leg: Tenderness and bony tenderness present.     Right knee: Normal.     Left knee: Normal.      Right lower leg: Normal. No edema.     Left lower leg: Normal. No edema.     Right ankle: Normal.     Right Achilles Tendon: Normal.     Left ankle: Normal.     Left Achilles Tendon: Normal.       Legs:  Skin:    General: Skin is warm and dry.     Capillary Refill: Capillary refill takes less than 2 seconds.  Neurological:     General: No focal deficit present.     Mental Status: She is alert and oriented to person, place, and time. Mental status is at baseline.  Psychiatric:        Mood and Affect: Mood normal.    ED Results / Procedures / Treatments   Labs (all labs ordered are listed, but only abnormal results are displayed) Labs Reviewed  COMPREHENSIVE METABOLIC PANEL - Abnormal; Notable for the following components:      Result Value   Sodium 134 (*)    Glucose, Bld 518 (*)    Creatinine,  Ser 1.23 (*)    GFR, Estimated 52 (*)    All other components within normal limits  LIPASE, BLOOD - Abnormal; Notable for the following components:   Lipase 58 (*)    All other components within normal limits  CBG MONITORING, ED - Abnormal; Notable for the following components:   Glucose-Capillary 401 (*)    All other components within normal limits  CBC WITH DIFFERENTIAL/PLATELET    EKG None  Radiology CT ABDOMEN PELVIS W CONTRAST  Result Date: 01/17/2021 CLINICAL DATA:  Left upper quadrant abdominal pain. EXAM: CT ABDOMEN AND PELVIS WITH CONTRAST TECHNIQUE: Multidetector CT imaging of the abdomen and pelvis was performed using the standard protocol following bolus administration of intravenous contrast. CONTRAST:  137mL OMNIPAQUE IOHEXOL 300 MG/ML  SOLN COMPARISON:  Jul 08, 2017. FINDINGS: Lower chest: No acute abnormality. Hepatobiliary: No focal liver abnormality is seen. Status post cholecystectomy. No biliary dilatation. Pancreas: Unremarkable. No pancreatic ductal dilatation or surrounding inflammatory changes. Spleen: Normal in size without focal abnormality. Adrenals/Urinary  Tract: Adrenal glands are unremarkable. Kidneys are normal, without renal calculi, focal lesion, or hydronephrosis. Bladder is unremarkable. Stomach/Bowel: Stomach is within normal limits. Appendix appears normal. No evidence of bowel wall thickening, distention, or inflammatory changes. Vascular/Lymphatic: Aortic atherosclerosis. No enlarged abdominal or pelvic lymph nodes. Reproductive: Status post hysterectomy. No adnexal masses. Other: No abdominal wall hernia or abnormality. No abdominopelvic ascites. Musculoskeletal: No acute or significant osseous findings. IMPRESSION: No acute abnormality seen in the abdomen or pelvis. Aortic Atherosclerosis (ICD10-I70.0). Electronically Signed   By: Marijo Conception M.D.   On: 01/17/2021 13:41   DG Hip Unilat With Pelvis 2-3 Views Left  Result Date: 01/17/2021 CLINICAL DATA:  Acute left hip pain without known injury. EXAM: DG HIP (WITH OR WITHOUT PELVIS) 2-3V LEFT COMPARISON:  None. FINDINGS: There is no evidence of hip fracture or dislocation. There is no evidence of arthropathy or other focal bone abnormality. IMPRESSION: Negative. Electronically Signed   By: Marijo Conception M.D.   On: 01/17/2021 10:07    Procedures Procedures   Medications Ordered in ED Medications  fentaNYL (SUBLIMAZE) injection 50 mcg (50 mcg Intravenous Given 01/17/21 1115)  ondansetron (ZOFRAN) injection 4 mg (4 mg Intravenous Given 01/17/21 1115)  sodium chloride 0.9 % bolus 1,000 mL (0 mLs Intravenous Stopped 01/17/21 1440)  iohexol (OMNIPAQUE) 300 MG/ML solution 100 mL (100 mLs Intravenous Contrast Given 01/17/21 1308)  ketorolac (TORADOL) 15 MG/ML injection 15 mg (15 mg Intravenous Given 01/17/21 1453)    ED Course  I have reviewed the triage vital signs and the nursing notes.  Pertinent labs & imaging results that were available during my care of the patient were reviewed by me and considered in my medical decision making (see chart for details).  Clinical Course as of 01/17/21  1609  Thu Jan 17, 2021  1200 I was informed by RN the patient's is significant elevated 518.  Unfortunately patient has an anaphylactic reaction to insulin aspart (NovoLog) therefore short acting insulin was not administered.  She does state that she only took 10 units of her long-acting insulin when she usually takes 40 this morning.  Also did not take her metformin or her [place] today.  Denies any nausea, vomiting, lightheadedness, or confusion. [RS]    Clinical Course User Index [RS] Charrise Lardner, Gypsy Balsam, PA-C   MDM Rules/Calculators/A&P  55 year old female presents with concern for left hip pain times a few days without known injury or trauma.  Differential includes related to acute fracture, dislocation, septic arthritis, bursitis, muscular strain.  Hypertensive on intake, vitals otherwise normal.  Cardiopulmonary exam is normal, abdominal exam is significant for left lower quadrant tenderness to palpation; tenderness palpation over the left ASIS and left greater trochanter without erythema, induration, or cellulitic changes of the skin.  CBC unremarkable, CMP with mild hyponatremia 134.  and creatinine 1.2 near patient's baseline; unfortunately she is very hyperglycemic to 518.  Patient with history of anaphylaxis to insulin aspart (NovoLog) therefore no short acting insulin administered in the ED.  Patient has not taken her metformin or her full dose of her long-acting insulin at home; only took 10 units rather than her normal 40 units of insulin at home today.  IV fluid bolus given with improvement in hyperglycemia to 400.  Patient without symptoms suggestive of DKA and anion gap is 8.  Lipase is very mildly elevated to 58.  From a perspective of the hip, suspect acute bursitis of the hip; will administer low-dose Toradol in the emergency department.  Recommend discussing role of NSAIDs short-term with her primary care doctor for her bursitis in the context of her  known poorly controlled diabetes.  May use Tylenol at home.  Ice the area.  No further work-up warranted in the ER at this time.  Ysabella voiced understanding of her medical evaluation and treatment plan.  Each of her questions was answered to her expressed satisfaction.  Return precautions were given.  Patient is well-appearing, stable, and appropriate for discharge at this time.  Able to ambulate out of the emergency department.  This chart was dictated using voice recognition software, Dragon. Despite the best efforts of this provider to proofread and correct errors, errors may still occur which can change documentation meaning.  Final Clinical Impression(s) / ED Diagnoses Final diagnoses:  Left hip pain    Rx / DC Orders ED Discharge Orders     None        Aura Dials 01/17/21 1610    Kommor, [place], MD 01/17/21 [id number]

## 2021-01-17 NOTE — ED Triage Notes (Signed)
Pt c/o pain to left hip bone. Denies known injury. States tender to palpation.

## 2021-01-17 NOTE — ED Notes (Signed)
Patient eating chips and drinking juice when picked up from the lobby.

## 2021-02-02 ENCOUNTER — Other Ambulatory Visit: Payer: Self-pay | Admitting: Medical

## 2021-02-21 ENCOUNTER — Telehealth (INDEPENDENT_AMBULATORY_CARE_PROVIDER_SITE_OTHER): Payer: Self-pay | Admitting: Family Medicine

## 2021-02-21 DIAGNOSIS — R519 Headache, unspecified: Secondary | ICD-10-CM

## 2021-02-21 DIAGNOSIS — R0981 Nasal congestion: Secondary | ICD-10-CM

## 2021-02-21 MED ORDER — DOXYCYCLINE HYCLATE 100 MG PO TABS: 100.0000 mg | ORAL_TABLET | Freq: Two times a day (BID) | ORAL | 0 refills | Status: DC

## 2021-02-21 NOTE — Patient Instructions (Signed)
° ° ° °--------------------------------------------------------------------------------------------------------------------------- ° ° ° °  WORK SLIP:  Patient [name],  26-Sep-1965, was seen for a medical visit today, 02/21/21 . Please excuse from work for a COVID/flu like illness. If Covid19 testing is positive advise 10 days minimum from the onset of symptoms (02/19/21) PLUS 1 day of no fever and improved symptoms. Will defer to employer for a sooner return to work if patient has 2 negative covid tests 24-48 hours apart and is feeling better, or if symptoms have resolved, it is greater than 5 days since the positive test and the patient can wear a high-quality, tight fitting mask such as N95 or KN95 at all times for an additional 5 days. Would also suggest COVID19 antigen testing is negative prior to early return from a Covid illness.  Sincerely: E-signature: Dr. Colin Benton, DO [place] Ph: [phone]   ------------------------------------------------------------------------------------------------------------------------------    HOME CARE TIPS:  -COVID19 testing information: [email or web address].[email or web address]  Most pharmacies also offer testing and home test kits. If the Covid19 test is positive and you desire antiviral treatment, please contact a [facility] or schedule a follow up virtual visit through your primary care office or through the [facility].  Other test to treat options: ConnectRV.is?[email or web address]  -I sent the medication(s) we discussed to your pharmacy: Meds ordered this encounter  Medications   doxycycline (VIBRA-TABS) 100 MG tablet    Sig: Take 1 tablet (100 mg total) by mouth 2 (two) times daily.    Dispense:  14 tablet    Refill:  0     -can use tylenol  if needed for fevers, aches and pains per instructions  -can use nasal saline a few times per day if you have nasal  congestion  -stay hydrated, drink plenty of fluids and eat small healthy meals - avoid dairy  -If the Covid test is positive, check out the [facility] website for more information on home care, transmission and treatment for COVID19  -follow up with your doctor in 2-3 days unless improving and feeling better  -stay home while sick, except to seek medical care. If you have COVID19, you will likely be contagious for 7-10 days.   It was nice to meet you today, and I really hope you are feeling better soon. I help [facility] out with telemedicine visits on Tuesdays and Thursdays and am happy to help if you need a follow up virtual visit on those days. Otherwise, if you have any concerns or questions following this visit please schedule a follow up visit with your Primary Care doctor or seek care at a local urgent care clinic to avoid delays in care.    Seek in person care or schedule a follow up video visit promptly if your symptoms worsen, new concerns arise or you are not improving with treatment. Call 911 and/or seek emergency care if your symptoms are severe or life threatening.

## 2021-02-21 NOTE — Progress Notes (Signed)
Virtual Visit via Video Note  I connected with [name]  on 02/21/21 at 12:40 PM EST by a video enabled telemedicine application and verified that I am speaking with the correct person using two identifiers.  Location patient:  Location provider:work or home office Persons participating in the virtual visit: patient, provider  I discussed the limitations and requested verbal permission for telemedicine visit. The patient expressed understanding and agreed to proceed.   HPI:  Acute telemedicine visit for "sinus infection": -Onset:has had congestion for several weeks, but much worse the last several days -Symptoms include: nasal congestion, now sinus pain and pressures, thick green and yellow mucus, headache, nausea, pnd -Denies: CP, SOB, , fevers, vomiting, diarrhea -Pertinent past medical history: see below, reports history of sinusitis -Pertinent medication allergies: Allergies  Allergen Reactions   Insulin Aspart Other (See Comments) and Swelling    adverse reaction to Novolog    Latex Itching and Rash   Morphine Itching and Rash  -COVID-19 vaccine status:  Immunization History  Administered Date(s) Administered   Influenza Whole 12/25/2009   Influenza, Seasonal, Injecte, Preservative Fre 12/11/2013   Influenza,inj,Quad PF,6+ Mos 11/24/2016, 11/10/2018, 10/30/2020   Influenza-Unspecified 11/30/2019   PFIZER(Purple Top)SARS-COV-2 Vaccination 04/28/2019, 05/23/2019   PNEUMOCOCCAL CONJUGATE-20 08/24/2020   Pneumococcal Polysaccharide-23 12/25/2009, 12/21/2012   Tdap 08/24/2020     ROS: See pertinent positives and negatives per HPI.  Past Medical History:  Diagnosis Date   ABDOMINAL PAIN-EPIGASTRIC 09/14/2009   Qualifier: Diagnosis of  By: Trellis Paganini PA-c, [name]    Acute cystitis 07/07/2017   ANKLE PAIN, LEFT 12/25/2009   Qualifier: Diagnosis of  By: Amil Amen MD, [name]     Anxiety 01/22/2016   CAD (coronary artery disease) 07/07/2017   Cardiac murmur 02/01/2019    Cardiomyopathy, secondary ([place]) 09/06/2009   Qualifier: Diagnosis of  By: Arvid Right   Formatting of this note might be different from the original. Overview:  Qualifier: Diagnosis of  By: Arvid Right   Cardiomyopathy, unspecified ([place]) 09/06/2009   Formatting of this note might be different from the original. Overview:  Overview:  Qualifier: Diagnosis of  By: Arvid Right  Overview:  Overview:  Qualifier: Diagnosis of  By: Arvid Right Formatting of this note might be different from the original. Overview:  Qualifier: Diagnosis of  By: Arvid Right   Cerebrovascular disease 09/13/2012   Cervical cancer ([place])    had surgery in 2001.   CHEST PAIN, ATYPICAL 07/30/2009   Qualifier: Diagnosis of  By: Amil Amen MD, Winona Legato of this note might be different from the original. Overview:  Qualifier: Diagnosis of  By: Amil Amen MD, [name]   Chronic back pain    Chronic kidney disease, stage III (moderate) ([place]) 05/23/2014   Chronic pain syndrome 01/23/2016   Coronary artery disease    30% lesions noted   Current use of insulin ([place]) 05/23/2014   Depression    Diabetes mellitus    DIABETES MELLITUS, TYPE II, UNCONTROLLED 07/30/2009   Qualifier: Diagnosis of  By: Amil Amen MD, [name]     Diabetes type 2, uncontrolled 05/23/2014   Diabetic gastroparesis associated with type 1 diabetes mellitus ([place]) 11/27/2016   DIABETIC PERIPHERAL NEUROPATHY 09/21/2009   Qualifier: Diagnosis of  By: Amil Amen MD, [name]     Diabetic peripheral neuropathy ([place]) 01/23/2016   Dysuria 12/25/2009   Qualifier: Diagnosis of  By: Amil Amen MD, [name]     Elevation of level of transaminase or  lactic acid dehydrogenase (LDH) 09/21/2009   Formatting of this note might be different from the original. Overview:  Qualifier: Diagnosis of  By: Amil Amen MD, [place]   Essential hypertension 08/30/2009   Qualifier: Diagnosis of  By:  Lovette Cliche, CNA, Christy     Gastric atony 07/30/2009   Formatting of this note might be different from the original. Overview:  Qualifier: Diagnosis of  By: Amil Amen MD, [name]   Gastroesophageal reflux disease 07/30/2009   Formatting of this note might be different from the original. Overview:  Overview:  Qualifier: Diagnosis of  By: Amil Amen MD, Elby Showers: Diagnosis of  By: Chester Holstein NP, Ellwood Handler of this note might be different from the original. Overview:  Qualifier: Diagnosis of  By: Amil Amen MD, Elby Showers: Diagnosis of  By: Chester Holstein NP, Nevin Bloodgood   Gastroparesis 07/30/2009   Qualifier: Diagnosis of  By: Amil Amen MD, [place]     GERD 07/30/2009   Qualifier: Diagnosis of  By: Amil Amen MD, Elby Showers: Diagnosis of  By: Chester Holstein NP, Nevin Bloodgood     GERD (gastroesophageal reflux disease)    History of cervical cancer 08/17/2015   Status post partial hysterectomy  Formatting of this note might be different from the original. Status post partial hysterectomy   HLD (hyperlipidemia) 07/30/2009   Qualifier: Diagnosis of  By: Amil Amen MD, [name]     Hyperlipidemia    Hyperlipidemia, unspecified 07/30/2009   Formatting of this note might be different from the original. Overview:  Qualifier: Diagnosis of  By: Amil Amen MD, Winona Legato of this note might be different from the original. Overview:  Qualifier: Diagnosis of  By: Amil Amen MD, [name]   Hypertension    INSOMNIA 09/21/2009   Qualifier: Diagnosis of  By: Amil Amen MD, [name]     Lumbar facet arthropathy 05/14/2016   Lumbar spinal stenosis 02/11/2018   Metatarsalgia of both feet 03/11/2017   Migraine 09/13/2012   Overview:  IMPRESSION: possible abd migraine   Mixed dyslipidemia 02/01/2019   Nausea & vomiting 07/07/2017   Nausea with vomiting, unspecified 09/14/2009   Qualifier: Diagnosis of  By: Shane Crutch, [name]   Formatting of this note might be different from the original. Overview:  Qualifier: Diagnosis  of  By: Shane Crutch, [name]   Neuropathy    Nonspecific abnormal findings on radiological and examination of skull and head 09/13/2012   Overweight (BMI 25.0-29.9) 07/21/2014   POSITIVE PPD 07/30/2009   Annotation: CXR clear ?  diagnosed in 2/11?  On INH/pyridoxine Qualifier: Diagnosis of  By: Amil Amen MD, [name]     Pure hypercholesterolemia 11/27/2016   Retention of urine 06/25/2011   Right flank pain    TOBACCO ABUSE 09/21/2009   Qualifier: Diagnosis of  By: Amil Amen MD, Winona Legato of this note might be different from the original. Overview:  Qualifier: Diagnosis of  By: Amil Amen MD, Elinor Parkinson, SERUM, ELEVATED 09/21/2009   Qualifier: Diagnosis of  By: Amil Amen MD, [name]     Trigeminal neuralgia    Tuberculin test reaction 07/30/2009   Formatting of this note might be different from the original. Overview:  Annotation: CXR clear ?  diagnosed in 2/11?  On INH/pyridoxine Qualifier: Diagnosis of  By: Amil Amen MD, [name]   Type 2 diabetes mellitus with hyperglycemia ([place]) 07/30/2009   Qualifier: Diagnosis of  By: Amil Amen MD, Winona Legato of this note might be different from the original. Overview:  Qualifier: Diagnosis of  By: Amil Amen MD,  [name]   Type 2 diabetes mellitus with hyperglycemia, with long-term current use of insulin ([place]) 07/30/2009   Formatting of this note might be different from the original. Overview:  05/30/2015 A1C was 16.7% (!)  Overview:  05/30/2015 A1C was 16.7% (!) Formatting of this note might be different from the original. 05/30/2015 A1C was 16.7% (!)   Type 2 diabetes mellitus with stage 3 chronic kidney disease ([place]) 09/14/2009   Qualifier: Diagnosis of  By: Shane Crutch, [name]    ULCER-GASTRIC 09/28/2009   Qualifier: Diagnosis of  By: Chester Holstein NP, Paula     URINARY TRACT INFECTION 09/21/2009   Qualifier: Diagnosis of  By: Amil Amen MD, Louanne Belton, CANDIDAL 12/25/2009   Qualifier: Diagnosis of  By: Amil Amen MD,  Tessa Lerner D deficiency 04/25/2013    Past Surgical History:  Procedure Laterality Date   ABDOMINAL HYSTERECTOMY     partial   CHOLECYSTECTOMY       Current Outpatient Medications:    doxycycline (VIBRA-TABS) 100 MG tablet, Take 1 tablet (100 mg total) by mouth 2 (two) times daily., Disp: 14 tablet, Rfl: 0   albuterol (VENTOLIN HFA) 108 (90 Base) MCG/ACT inhaler, Inhale 2 puffs into the lungs every 6 (six) hours as needed for wheezing or shortness of breath., Disp: 18 g, Rfl: 2   amLODipine (NORVASC) 10 MG tablet, Take 1 tablet by mouth once daily, Disp: 90 tablet, Rfl: 0   aspirin 81 MG tablet, Take 81 mg by mouth daily., Disp: , Rfl:    atorvastatin (LIPITOR) 80 MG tablet, Take 1 tablet by mouth once daily, Disp: 60 tablet, Rfl: 0   azelastine (ASTELIN) 0.1 % nasal spray, Place 2 sprays into both nostrils 2 (two) times daily., Disp: 30 mL, Rfl: 1   busPIRone (BUSPAR) 15 MG tablet, Take 1 tablet (15 mg total) by mouth 2 (two) times daily., Disp: 60 tablet, Rfl: 3   carvedilol (COREG) 25 MG tablet, Take 1 tablet (25 mg total) by mouth 2 (two) times daily with a meal., Disp: 180 tablet, Rfl: 0   Cholecalciferol (VITAMIN D3) 10 MCG (400 UNIT) CAPS, Take 400 mg by mouth daily., Disp: 150 capsule, Rfl: 0   FARXIGA 10 MG TABS tablet, Take 1 tablet by mouth once daily, Disp: 90 tablet, Rfl: 0   fluticasone (FLONASE) 50 MCG/ACT nasal spray, Place 2 sprays into both nostrils daily., Disp: 16 g, Rfl: 1   fluticasone furoate-vilanterol (BREO ELLIPTA) 100-25 MCG/INH AEPB, Inhale 1 puff into the lungs daily., Disp: 100 each, Rfl: 2   gabapentin (NEURONTIN) 300 MG capsule, Take 2 capsules (600 mg total) by mouth 3 (three) times daily., Disp: 270 capsule, Rfl: 0   insulin degludec (TRESIBA) 100 UNIT/ML FlexTouch Pen, Inject 40 Units into the skin daily at 10 pm., Disp: 100 mL, Rfl: 2   meloxicam (MOBIC) 7.5 MG tablet, Take 1-2 tablets (7.5-15 mg total) by mouth daily., Disp: 60 tablet, Rfl:  0   metFORMIN (GLUCOPHAGE-XR) 500 MG 24 hr tablet, Take 1 tablet (500 mg total) by mouth daily with breakfast., Disp: 60 tablet, Rfl: 2   methocarbamol (ROBAXIN) 750 MG tablet, Take 1 tablet (750 mg total) by mouth every 8 (eight) hours as needed for muscle spasms., Disp: 30 tablet, Rfl: 0   nitroGLYCERIN (NITROSTAT) 0.4 MG SL tablet, Place 0.4 mg under the tongue every 5 (five) minutes as needed for chest pain., Disp: , Rfl:    oxyCODONE (ROXICODONE) 5 MG immediate release  tablet, Take 1 tablet (5 mg total) by mouth every 6 (six) hours as needed for severe pain., Disp: 30 tablet, Rfl: 0   promethazine (PHENERGAN) 12.5 MG tablet, Take 1 tablet (12.5 mg total) by mouth every 8 (eight) hours as needed for nausea or vomiting., Disp: 12 tablet, Rfl: 0   promethazine (PHENERGAN) 12.5 MG tablet, Take 1 tablet (12.5 mg total) by mouth every 8 (eight) hours as needed., Disp: 30 tablet, Rfl: 0   rizatriptan (MAXALT-MLT) 10 MG disintegrating tablet, Take 10 mg by mouth daily as needed for migraine., Disp: , Rfl:    sertraline (ZOLOFT) 100 MG tablet, Take 1 tablet (100 mg total) by mouth daily., Disp: 30 tablet, Rfl: 3   spironolactone (ALDACTONE) 25 MG tablet, Take 1 tablet by mouth once daily, Disp: 30 tablet, Rfl: 0   topiramate (TOPAMAX) 50 MG tablet, Take 1 tablet (50 mg total) by mouth 2 (two) times daily., Disp: 60 tablet, Rfl: 12  EXAM:  VITALS per patient if applicable:  GENERAL: alert, oriented, appears well and in no acute distress  HEENT: atraumatic, conjunttiva clear, no obvious abnormalities on inspection of external nose and ears  NECK: normal movements of the head and neck  LUNGS: on inspection no signs of respiratory distress, breathing rate appears normal, no obvious gross SOB, gasping or wheezing  CV: no obvious cyanosis  MS: moves all visible extremities without noticeable abnormality  PSYCH/NEURO: pleasant and cooperative, no obvious depression or anxiety, speech and thought  processing grossly intact  ASSESSMENT AND PLAN:  Discussed the following assessment and plan:  Nasal sinus congestion  Facial discomfort  -we discussed possible serious and likely etiologies, options for evaluation and workup, limitations of telemedicine visit vs in person visit, treatment, treatment risks and precautions. Pt is agreeable to treatment via telemedicine at this moment. Query bacterial sinusitis given several weeks of symptoms now worsening, versus new acute VURI, covid vs other. She agrees to covid test and if positive knows can contact [facility] pharmacy if desires antiviral tx. O/w nasal saline twice daily and she wants to try empiric doxy.  Work/School slipped offered: provided in patient instructions Advised to seek prompt virtual visit or in person care if worsening, new symptoms arise, or if is not improving with treatment as expected per our conversation of expected course I discussed the assessment and treatment plan with the patient. The patient was provided an opportunity to ask questions and all were answered. The patient agreed with the plan and demonstrated an understanding of the instructions.     Lucretia Kern, DO

## 2021-03-20 ENCOUNTER — Other Ambulatory Visit: Payer: Self-pay | Admitting: Medical

## 2021-03-20 NOTE — Telephone Encounter (Signed)
Medication: methocarbamol (ROBAXIN) 750 MG tablet   nitroGLYCERIN (NITROSTAT) 0.4 MG SL tablet  oxyCODONE (ROXICODONE) 5 MG immediate release tablet   Has the patient contacted their pharmacy? Yes.     Preferred Pharmacy: [facility] [place], [place], [place] [address] [phone]

## 2021-03-21 MED ORDER — OXYCODONE HCL 5 MG PO TABS: 5.0000 mg | ORAL_TABLET | Freq: Four times a day (QID) | ORAL | 0 refills | Status: DC | PRN

## 2021-03-21 MED ORDER — MELOXICAM 7.5 MG PO TABS: 7.5000 mg | ORAL_TABLET | Freq: Every day | ORAL | 0 refills | Status: DC

## 2021-03-21 MED ORDER — NITROGLYCERIN 0.4 MG SL SUBL: 0.4000 mg | SUBLINGUAL_TABLET | SUBLINGUAL | 3 refills | Status: DC | PRN

## 2021-03-21 NOTE — Telephone Encounter (Addendum)
Requesting:OXYCODONE Contract:08/2020 UDS:10/30/20 Last Visit:10/30/20 Next Visit:n/a Last Refill:01/15/21  Please Advise   Set rx.   Have pt schedule follow up within a month. Control med visit and to follow other chronic problems.  Mackie Pai, PA-C

## 2021-04-01 ENCOUNTER — Telehealth: Payer: Self-pay | Admitting: Medical

## 2021-04-01 MED ORDER — METHOCARBAMOL 750 MG PO TABS: 750.0000 mg | ORAL_TABLET | Freq: Three times a day (TID) | ORAL | 0 refills | Status: DC | PRN

## 2021-04-01 NOTE — Telephone Encounter (Signed)
Called pt tried to get her to come in and drop off a sample no answer and unable to lvm

## 2021-04-01 NOTE — Telephone Encounter (Signed)
Medication: methocarbamol (ROBAXIN) 750 MG tablet  Has the patient contacted their pharmacy? Yes.     Preferred Pharmacy: [facility], [address]  [place], [place] [address]  Phone:  [phone]  Fax:  [phone]

## 2021-04-01 NOTE — Telephone Encounter (Signed)
Pt is currently having uti symps, pt is requesting urine samples. Pt has vv sched 2/21, advised pt Percell Miller may want in person.   Please advise.

## 2021-04-01 NOTE — Telephone Encounter (Signed)
Rx sent 

## 2021-04-02 ENCOUNTER — Telehealth (INDEPENDENT_AMBULATORY_CARE_PROVIDER_SITE_OTHER): Payer: Self-pay | Admitting: Medical

## 2021-04-02 DIAGNOSIS — R5383 Other fatigue: Secondary | ICD-10-CM

## 2021-04-02 DIAGNOSIS — R1084 Generalized abdominal pain: Secondary | ICD-10-CM

## 2021-04-02 NOTE — Telephone Encounter (Signed)
Called pt no answer and unable to lvm

## 2021-04-02 NOTE — Patient Instructions (Addendum)
Acute onset yesterday of fatigue and describing feeling almost near syncope/faint.  Her stomach has been hurting and she was having trouble sleeping at night.  Woke up shaky this morning and could barely get out of bed.  Described that her energy did improve some since this morning.  Appointment notes stated UTI.  However this overall description does not seem UTI-like.  So advised in office visit tomorrow at 140.  We will likely need to do labs and probably get urinalysis as well.  Advised patient during the interim that if her signs symptoms worsen or change in seen in the emergency department.

## 2021-04-02 NOTE — Telephone Encounter (Signed)
Called pt no answer , and unable to lv

## 2021-04-02 NOTE — Telephone Encounter (Signed)
Pt stated she unable to come into the office to bring sample due to feeling nauseous

## 2021-04-02 NOTE — Progress Notes (Signed)
° °  Subjective:    Patient ID: [name], female    DOB: 1965/09/12, 56 y.o.   MRN: [id number]  HPI Virtual Visit via Telephone Note  I connected with [name] on 04/02/21 at  3:20 PM EST by telephone and verified that I am speaking with the correct person using two identifiers.  Location: Patient: home Provider: office   I discussed the limitations, risks, security and privacy concerns of performing an evaluation and management service by telephone and the availability of in person appointments. I also discussed with the patient that there may be a patient responsible charge related to this service. The patient expressed understanding and agreed to proceed.   History of Present Illness:  Symptoms started yesterday feeling weak and faint. Stomach was hurtng.  Later in evening stomach kept hurting. Went to sleep around 10 pm.  Woke up shaking like could barely stand. Describe later got more energy/ could get out of bed.  Pt did not mention any uti signs/symptoms.    Observations/Objective: No acute distress.  Assessment and Plan:  Patient Instructions  Acute onset yesterday of fatigue and describing feeling almost near syncope/faint.  Her stomach has been hurting and she was having trouble sleeping at night.  Woke up shaky this morning and could barely get out of bed.  Described that her energy did improve some since this morning.  Appointment notes stated UTI.  However this overall description does not seem UTI-like.  So advised in office visit tomorrow at 140.  We will likely need to do labs and probably get urinalysis as well.  Advised patient during the interim that if her signs symptoms worsen or change in seen in the emergency department.   Mackie Pai, PA-C  Follow Up Instructions:    I discussed the assessment and treatment plan with the patient. The patient was provided an opportunity to ask questions and all were answered. The patient agreed with  the plan and demonstrated an understanding of the instructions.   The patient was advised to call back or seek an in-person evaluation if the symptoms worsen or if the condition fails to improve as anticipated.  I provided 13 minutes of non-face-to-face time during this encounter.   Mackie Pai, PA-C    Review of Systems     Objective:   Physical Exam        Assessment & Plan:

## 2021-04-03 ENCOUNTER — Ambulatory Visit (INDEPENDENT_AMBULATORY_CARE_PROVIDER_SITE_OTHER): Payer: Self-pay | Admitting: Medical

## 2021-04-03 VITALS — BP 140/97 | HR 89 | Temp 98.2°F | Resp 18 | Ht 73.0 in | Wt 175.0 lb

## 2021-04-03 DIAGNOSIS — R1030 Lower abdominal pain, unspecified: Secondary | ICD-10-CM

## 2021-04-03 DIAGNOSIS — R197 Diarrhea, unspecified: Secondary | ICD-10-CM

## 2021-04-03 DIAGNOSIS — G43809 Other migraine, not intractable, without status migrainosus: Secondary | ICD-10-CM

## 2021-04-03 DIAGNOSIS — R5383 Other fatigue: Secondary | ICD-10-CM

## 2021-04-03 DIAGNOSIS — R110 Nausea: Secondary | ICD-10-CM

## 2021-04-03 DIAGNOSIS — R300 Dysuria: Secondary | ICD-10-CM

## 2021-04-03 LAB — POC URINALSYSI DIPSTICK (AUTOMATED)
Bilirubin, UA: NEGATIVE
Glucose, UA: NEGATIVE
Ketones, UA: NEGATIVE
Leukocytes, UA: NEGATIVE
Nitrite, UA: NEGATIVE
Protein, UA: POSITIVE — AB
Spec Grav, UA: 1.015 (ref 1.010–1.025)
Urobilinogen, UA: 0.2 E.U./dL
pH, UA: 6.5 (ref 5.0–8.0)

## 2021-04-03 LAB — CBC WITH DIFFERENTIAL/PLATELET
Basophils Absolute: 0 10*3/uL (ref 0.0–0.1)
Basophils Relative: 0.6 % (ref 0.0–3.0)
Eosinophils Absolute: 0.5 10*3/uL (ref 0.0–0.7)
Eosinophils Relative: 9 % — ABNORMAL HIGH (ref 0.0–5.0)
HCT: 41 % (ref 36.0–46.0)
Hemoglobin: 13.5 g/dL (ref 12.0–15.0)
Lymphocytes Relative: 36.2 % (ref 12.0–46.0)
Lymphs Abs: 2 10*3/uL (ref 0.7–4.0)
MCHC: 32.8 g/dL (ref 30.0–36.0)
MCV: 89.7 fl (ref 78.0–100.0)
Monocytes Absolute: 0.6 10*3/uL (ref 0.1–1.0)
Monocytes Relative: 11 % (ref 3.0–12.0)
Neutro Abs: 2.4 10*3/uL (ref 1.4–7.7)
Neutrophils Relative %: 43.2 % (ref 43.0–77.0)
Platelets: 222 10*3/uL (ref 150.0–400.0)
RBC: 4.57 Mil/uL (ref 3.87–5.11)
RDW: 15.3 % (ref 11.5–15.5)
WBC: 5.5 10*3/uL (ref 4.0–10.5)

## 2021-04-03 LAB — COMPREHENSIVE METABOLIC PANEL
ALT: 19 U/L (ref 0–35)
AST: 19 U/L (ref 0–37)
Albumin: 3.9 g/dL (ref 3.5–5.2)
Alkaline Phosphatase: 81 U/L (ref 39–117)
BUN: 19 mg/dL (ref 6–23)
CO2: 28 mEq/L (ref 19–32)
Calcium: 8.6 mg/dL (ref 8.4–10.5)
Chloride: 105 mEq/L (ref 96–112)
Creatinine, Ser: 1.05 mg/dL (ref 0.40–1.20)
GFR: 59.49 mL/min — ABNORMAL LOW (ref >60.00)
Glucose, Bld: 228 mg/dL — ABNORMAL HIGH (ref 70–99)
Potassium: 3.7 mEq/L (ref 3.5–5.1)
Sodium: 138 mEq/L (ref 135–145)
Total Bilirubin: 0.5 mg/dL (ref 0.2–1.2)
Total Protein: 7.2 g/dL (ref 6.0–8.3)

## 2021-04-03 LAB — LIPASE: Lipase: 34 U/L (ref 11.0–59.0)

## 2021-04-03 MED ORDER — METRONIDAZOLE 500 MG PO TABS: 500.0000 mg | ORAL_TABLET | Freq: Three times a day (TID) | ORAL | 0 refills | Status: AC

## 2021-04-03 MED ORDER — PROMETHAZINE HCL 12.5 MG PO TABS: 12.5000 mg | ORAL_TABLET | Freq: Three times a day (TID) | ORAL | 0 refills | Status: DC | PRN

## 2021-04-03 MED ORDER — FAMOTIDINE 20 MG PO TABS: 20.0000 mg | ORAL_TABLET | Freq: Every day | ORAL | 0 refills | Status: DC

## 2021-04-03 MED ORDER — CIPROFLOXACIN HCL 500 MG PO TABS: 500.0000 mg | ORAL_TABLET | Freq: Two times a day (BID) | ORAL | 0 refills | Status: DC

## 2021-04-03 NOTE — Progress Notes (Signed)
Subjective:    Patient ID: [name], female    DOB: [birth year], 56 y.o.   MRN: [id number]  HPI Pt had telephone visit yesterday. Advised to come in due to nature of complaint. Below in " from phone visit.   "Symptoms started yesterday feeling weak and faint. Stomach was hurtng.  Later in evening stomach kept hurting. Went to sleep around 10 pm.   Woke up shaking like could barely stand. Describe later got more energy/ could get out of bed."  Pt states when eats or drinks has abdomen pain. Some [place] over past 2 days.  She has diarrhea since yesterday. Had 3 loose stools.    01-17-2021 ct abd and pelvis.  IMPRESSION: No acute abnormality seen in the abdomen or pelvis.  Some pain in lower abdomen region.  Pt never had colonoscopy. She states on egd her stomach was too full and colnosocpy not done but not rescheduled.  I put in referral for colonoscopy back in 10-25-2020.  08-24-20. Referral to gyn placed for screening cervical CA. Scheduled on review and then pt cancelled.  Pt states she does have pain when urinating. Some frequent urination. Chills but no fever. Faint mild left cva area pain.     Review of Systems  Constitutional:  Negative for chills, fatigue and fever.  Respiratory:  Negative for cough, chest tightness, shortness of breath and wheezing.   Cardiovascular:  Negative for chest pain and palpitations.  Gastrointestinal:  Positive for nausea. Negative for abdominal distention, blood in stool and vomiting.  Genitourinary:  Positive for dysuria and frequency. Negative for urgency and vaginal pain.  Musculoskeletal:  Negative for back pain.       Some left cva per pt but not on exam  Neurological:  Negative for dizziness and headaches.  Hematological:  Negative for adenopathy. Does not bruise/bleed easily.  Psychiatric/Behavioral:  Negative for behavioral problems and confusion.     Past Medical History:  Diagnosis Date   ABDOMINAL  PAIN-EPIGASTRIC 09/14/2009   Qualifier: Diagnosis of  By: Trellis Paganini PA-c, [name]    Acute cystitis 07/07/2017   ANKLE PAIN, LEFT 12/25/2009   Qualifier: Diagnosis of  By: Amil Amen MD, [name]     Anxiety 01/22/2016   CAD (coronary artery disease) 07/07/2017   Cardiac murmur 02/01/2019   Cardiomyopathy, secondary ([place]) 09/06/2009   Qualifier: Diagnosis of  By: Arvid Right   Formatting of this note might be different from the original. Overview:  Qualifier: Diagnosis of  By: Arvid Right   Cardiomyopathy, unspecified ([place]) 09/06/2009   Formatting of this note might be different from the original. Overview:  Overview:  Qualifier: Diagnosis of  By: Arvid Right  Overview:  Overview:  Qualifier: Diagnosis of  By: Arvid Right Formatting of this note might be different from the original. Overview:  Qualifier: Diagnosis of  By: Arvid Right   Cerebrovascular disease 09/13/2012   Cervical cancer ([place])    had surgery in 2001.   CHEST PAIN, ATYPICAL 07/30/2009   Qualifier: Diagnosis of  By: Amil Amen MD, Winona Legato of this note might be different from the original. Overview:  Qualifier: Diagnosis of  By: Amil Amen MD, [name]   Chronic back pain    Chronic kidney disease, stage III (moderate) ([place]) 05/23/2014   Chronic pain syndrome 01/23/2016   Coronary artery disease    30% lesions noted   Current use of insulin ([place]) 05/23/2014   Depression  Diabetes mellitus    DIABETES MELLITUS, TYPE II, UNCONTROLLED 07/30/2009   Qualifier: Diagnosis of  By: Amil Amen MD, [name]     Diabetes type 2, uncontrolled 05/23/2014   Diabetic gastroparesis associated with type 1 diabetes mellitus ([place]) 11/27/2016   DIABETIC PERIPHERAL NEUROPATHY 09/21/2009   Qualifier: Diagnosis of  By: Amil Amen MD, [name]     Diabetic peripheral neuropathy ([place]) 01/23/2016   Dysuria 12/25/2009   Qualifier: Diagnosis of  By: Amil Amen MD,  [name]     Elevation of level of transaminase or lactic acid dehydrogenase (LDH) 09/21/2009   Formatting of this note might be different from the original. Overview:  Qualifier: Diagnosis of  By: Amil Amen MD, [place] Neighbors   Essential hypertension 08/30/2009   Qualifier: Diagnosis of  By: Lovette Cliche, CNA, Christy     Gastric atony 07/30/2009   Formatting of this note might be different from the original. Overview:  Qualifier: Diagnosis of  By: Amil Amen MD, [place]   Gastroesophageal reflux disease 07/30/2009   Formatting of this note might be different from the original. Overview:  Overview:  Qualifier: Diagnosis of  By: Amil Amen MD, Elby Showers: Diagnosis of  By: Chester Holstein NP, Ellwood Handler of this note might be different from the original. Overview:  Qualifier: Diagnosis of  By: Amil Amen MD, Elby Showers: Diagnosis of  By: Chester Holstein NP, Nevin Bloodgood   Gastroparesis 07/30/2009   Qualifier: Diagnosis of  By: Amil Amen MD, [place]     GERD 07/30/2009   Qualifier: Diagnosis of  By: Amil Amen MD, Elby Showers: Diagnosis of  By: Chester Holstein NP, Nevin Bloodgood     GERD (gastroesophageal reflux disease)    History of cervical cancer 08/17/2015   Status post partial hysterectomy  Formatting of this note might be different from the original. Status post partial hysterectomy   HLD (hyperlipidemia) 07/30/2009   Qualifier: Diagnosis of  By: Amil Amen MD, [name]     Hyperlipidemia    Hyperlipidemia, unspecified 07/30/2009   Formatting of this note might be different from the original. Overview:  Qualifier: Diagnosis of  By: Amil Amen MD, Winona Legato of this note might be different from the original. Overview:  Qualifier: Diagnosis of  By: Amil Amen MD, [name]   Hypertension    INSOMNIA 09/21/2009   Qualifier: Diagnosis of  By: Amil Amen MD, [name]     Lumbar facet arthropathy 05/14/2016   Lumbar spinal stenosis 02/11/2018   Metatarsalgia of both feet 03/11/2017   Migraine 09/13/2012   Overview:   IMPRESSION: possible abd migraine   Mixed dyslipidemia 02/01/2019   Nausea & vomiting 07/07/2017   Nausea with vomiting, unspecified 09/14/2009   Qualifier: Diagnosis of  By: Shane Crutch, [name]   Formatting of this note might be different from the original. Overview:  Qualifier: Diagnosis of  By: Shane Crutch, [name]   Neuropathy    Nonspecific abnormal findings on radiological and examination of skull and head 09/13/2012   Overweight (BMI 25.0-29.9) 07/21/2014   POSITIVE PPD 07/30/2009   Annotation: CXR clear ?  diagnosed in 2/11?  On INH/pyridoxine Qualifier: Diagnosis of  By: Amil Amen MD, [name]     Pure hypercholesterolemia 11/27/2016   Retention of urine 06/25/2011   Right flank pain    TOBACCO ABUSE 09/21/2009   Qualifier: Diagnosis of  By: Amil Amen MD, Winona Legato of this note might be different from the original. Overview:  Qualifier: Diagnosis of  By: Amil Amen MD, Elinor Parkinson, SERUM, ELEVATED 09/21/2009   Qualifier: Diagnosis of  By: Amil Amen MD, [name]     Trigeminal neuralgia    Tuberculin test reaction 07/30/2009   Formatting of this note might be different from the original. Overview:  Annotation: CXR clear ?  diagnosed in 2/11?  On INH/pyridoxine Qualifier: Diagnosis of  By: Amil Amen MD, [name]   Type 2 diabetes mellitus with hyperglycemia ([place]) 07/30/2009   Qualifier: Diagnosis of  By: Amil Amen MD, Winona Legato of this note might be different from the original. Overview:  Qualifier: Diagnosis of  By: Amil Amen MD, [name]   Type 2 diabetes mellitus with hyperglycemia, with long-term current use of insulin ([place]) 07/30/2009   Formatting of this note might be different from the original. Overview:  05/30/2015 A1C was 16.7% (!)  Overview:  05/30/2015 A1C was 16.7% (!) Formatting of this note might be different from the original. 05/30/2015 A1C was 16.7% (!)   Type 2 diabetes mellitus with stage 3 chronic kidney disease ([place]) 09/14/2009   Qualifier:  Diagnosis of  By: Shane Crutch, [name]    ULCER-GASTRIC 09/28/2009   Qualifier: Diagnosis of  By: Chester Holstein NP, Paula     URINARY TRACT INFECTION 09/21/2009   Qualifier: Diagnosis of  By: Amil Amen MD, Louanne Belton, CANDIDAL 12/25/2009   Qualifier: Diagnosis of  By: Amil Amen MD, Tessa Lerner D deficiency 04/25/2013     Social History   Socioeconomic History   Marital status: Single    Spouse name: Not on file   Number of children: Not on file   Years of education: Not on file   Highest education level: Not on file  Occupational History   Not on file  Tobacco Use   Smoking status: Never   Smokeless tobacco: Never  Vaping Use   Vaping Use: Never used  Substance and Sexual Activity   Alcohol use: Yes    Comment: occ   Drug use: No   Sexual activity: Not on file  Other Topics Concern   Not on file  Social History Narrative   Not on file   Social Determinants of Health   Financial Resource Strain: Not on file  Food Insecurity: Not on file  Transportation Needs: Not on file  Physical Activity: Not on file  Stress: Not on file  Social Connections: Not on file  Intimate Partner Violence: Not on file    Past Surgical History:  Procedure Laterality Date   ABDOMINAL HYSTERECTOMY     partial   CHOLECYSTECTOMY      Family History  Problem Relation Age of Onset   Diabetes Mother    Diabetes Father     Allergies  Allergen Reactions   Insulin Aspart Other (See Comments) and Swelling    adverse reaction to Novolog    Latex Itching and Rash   Morphine Itching and Rash    Current Outpatient Medications on File Prior to Visit  Medication Sig Dispense Refill   albuterol (VENTOLIN HFA) 108 (90 Base) MCG/ACT inhaler Inhale 2 puffs into the lungs every 6 (six) hours as needed for wheezing or shortness of breath. 18 g 2   amLODipine (NORVASC) 10 MG tablet Take 1 tablet by mouth once daily 90 tablet 0   aspirin 81 MG tablet Take 81 mg by mouth daily.      atorvastatin (LIPITOR) 80 MG tablet Take 1 tablet by mouth once daily 60 tablet 0   azelastine (ASTELIN) 0.1 % nasal spray Place 2 sprays into both nostrils 2 (two) times daily.  30 mL 1   busPIRone (BUSPAR) 15 MG tablet Take 1 tablet (15 mg total) by mouth 2 (two) times daily. 60 tablet 3   carvedilol (COREG) 25 MG tablet Take 1 tablet (25 mg total) by mouth 2 (two) times daily with a meal. 180 tablet 0   Cholecalciferol (VITAMIN D3) 10 MCG (400 UNIT) CAPS Take 400 mg by mouth daily. 150 capsule 0   doxycycline (VIBRA-TABS) 100 MG tablet Take 1 tablet (100 mg total) by mouth 2 (two) times daily. 14 tablet 0   FARXIGA 10 MG TABS tablet Take 1 tablet by mouth once daily 90 tablet 0   fluticasone (FLONASE) 50 MCG/ACT nasal spray Place 2 sprays into both nostrils daily. 16 g 1   fluticasone furoate-vilanterol (BREO ELLIPTA) 100-25 MCG/INH AEPB Inhale 1 puff into the lungs daily. 100 each 2   gabapentin (NEURONTIN) 300 MG capsule Take 2 capsules (600 mg total) by mouth 3 (three) times daily. 270 capsule 0   insulin degludec (TRESIBA) 100 UNIT/ML FlexTouch Pen Inject 40 Units into the skin daily at 10 pm. 100 mL 2   meloxicam (MOBIC) 7.5 MG tablet Take 1-2 tablets (7.5-15 mg total) by mouth daily. 60 tablet 0   metFORMIN (GLUCOPHAGE-XR) 500 MG 24 hr tablet Take 1 tablet (500 mg total) by mouth daily with breakfast. 60 tablet 2   methocarbamol (ROBAXIN) 750 MG tablet Take 1 tablet (750 mg total) by mouth every 8 (eight) hours as needed for muscle spasms. 30 tablet 0   nitroGLYCERIN (NITROSTAT) 0.4 MG SL tablet Place 1 tablet (0.4 mg total) under the tongue every 5 (five) minutes as needed for chest pain. 30 tablet 3   oxyCODONE (ROXICODONE) 5 MG immediate release tablet Take 1 tablet (5 mg total) by mouth every 6 (six) hours as needed for severe pain. 30 tablet 0   promethazine (PHENERGAN) 12.5 MG tablet Take 1 tablet (12.5 mg total) by mouth every 8 (eight) hours as needed for nausea or vomiting. 12 tablet  0   promethazine (PHENERGAN) 12.5 MG tablet Take 1 tablet (12.5 mg total) by mouth every 8 (eight) hours as needed. 30 tablet 0   rizatriptan (MAXALT-MLT) 10 MG disintegrating tablet Take 10 mg by mouth daily as needed for migraine.     sertraline (ZOLOFT) 100 MG tablet Take 1 tablet (100 mg total) by mouth daily. 30 tablet 3   spironolactone (ALDACTONE) 25 MG tablet Take 1 tablet by mouth once daily 30 tablet 0   topiramate (TOPAMAX) 50 MG tablet Take 1 tablet (50 mg total) by mouth 2 (two) times daily. 60 tablet 12   No current facility-administered medications on file prior to visit.    BP (!) 140/97    Pulse 89    Temp 98.2 F (36.8 C)    Resp 18    Ht 6\' 1"  (1.854 m)    Wt 175 lb (79.4 kg)    SpO2 99%    BMI 23.09 kg/m       Objective:   Physical Exam General Mental Status- Alert. General Appearance- Not in acute distress.   Skin General: Color- Normal Color. Moisture- Normal Moisture.  Neck Carotid Arteries- Normal color. Moisture- Normal Moisture. No carotid bruits. No JVD.  Chest and Lung Exam Auscultation: Breath Sounds:-Normal.  Cardiovascular Auscultation:Rythm- Regular. Murmurs & Other Heart Sounds:Auscultation of the heart reveals- No Murmurs.  Abdomen Inspection:-Inspeection Normal. Palpation/Percussion:Note:No mass. Palpation and Percussion of the abdomen reveal-faint suprapubic tender, Non Distended + BS, no rebound or guarding.  Neurologic Cranial Nerve exam:- CN III-XII intact(No nystagmus), symmetric smile. Strength:- 5/5 equal and symmetric strength both upper and lower extremities.   Back-no CVA tenderness       Assessment & Plan:   Patient Instructions  Recent described moderate abdomen pain after eating.  Some nausea.  Placed order for CMP, CBC and lipase.  Refilled your Phenergan for nausea.  Prescribed famotidine to take 1 tablet daily.  For loose stools I did place order for stool studies.  If your diarrhea worsens/continues and  recommend turning in stool studies by Friday.  Along with the plug you also report some painful urination and frequent urination.  Small protein and blood in the urine.  However no obvious UTI indicators presently.  Ending out urine culture.  Pending study results go ahead and start Cipro antibiotic.  Also making Flagyl antibiotic available.  Let you know the results of the labs and urine studies.  I have you start Flagyl in addition to the Cipro.  For the blood in your urine it is very important to repeat your urine in 2 weeks.  We need to make sure that blood no longer present.  If blood still persist then would refer you to urologist.  Follow-up in 2 weeks or sooner if needed.   Mackie Pai, PA-C

## 2021-04-03 NOTE — Patient Instructions (Signed)
Recent described moderate abdomen pain after eating.  Some nausea.  Placed order for CMP, CBC and lipase.  Refilled your Phenergan for nausea.  Prescribed famotidine to take 1 tablet daily.  For loose stools I did place order for stool studies.  If your diarrhea worsens/continues and recommend turning in stool studies by Friday.  Along with the plug you also report some painful urination and frequent urination.  Small protein and blood in the urine.  However no obvious UTI indicators presently.  Ending out urine culture.  Pending study results go ahead and start Cipro antibiotic.  Also making Flagyl antibiotic available.  Let you know the results of the labs and urine studies.  I have you start Flagyl in addition to the Cipro.  For the blood in your urine it is very important to repeat your urine in 2 weeks.  We need to make sure that blood no longer present.  If blood still persist then would refer you to urologist.  Follow-up in 2 weeks or sooner if needed.

## 2021-04-06 LAB — URINE CULTURE
MICRO NUMBER:: 13042621
SPECIMEN QUALITY:: ADEQUATE

## 2021-04-08 ENCOUNTER — Telehealth: Payer: Self-pay

## 2021-04-08 NOTE — Telephone Encounter (Signed)
Pt called in states that she needed medication for edema

## 2021-04-09 NOTE — Telephone Encounter (Signed)
Pt.notified

## 2021-04-19 ENCOUNTER — Other Ambulatory Visit: Payer: Self-pay

## 2021-04-19 ENCOUNTER — Telehealth: Payer: Self-pay | Admitting: Medical

## 2021-04-19 ENCOUNTER — Encounter: Payer: Self-pay | Admitting: Family Medicine

## 2021-04-19 ENCOUNTER — Emergency Department (HOSPITAL_BASED_OUTPATIENT_CLINIC_OR_DEPARTMENT_OTHER): Payer: Self-pay

## 2021-04-19 ENCOUNTER — Emergency Department (HOSPITAL_BASED_OUTPATIENT_CLINIC_OR_DEPARTMENT_OTHER)
Admission: EM | Admit: 2021-04-19 | Discharge: 2021-04-19 | Disposition: A | Discharge status: Home / Self Care | Payer: Self-pay | Attending: Emergency Medicine | Admitting: Emergency Medicine

## 2021-04-19 ENCOUNTER — Ambulatory Visit (INDEPENDENT_AMBULATORY_CARE_PROVIDER_SITE_OTHER): Payer: Self-pay | Admitting: Family Medicine

## 2021-04-19 ENCOUNTER — Encounter (HOSPITAL_BASED_OUTPATIENT_CLINIC_OR_DEPARTMENT_OTHER): Payer: Self-pay

## 2021-04-19 VITALS — BP 118/84 | HR 110 | Temp 98.9°F | Resp 18 | Ht 73.0 in | Wt 168.4 lb

## 2021-04-19 DIAGNOSIS — M79662 Pain in left lower leg: Secondary | ICD-10-CM

## 2021-04-19 DIAGNOSIS — Z79899 Other long term (current) drug therapy: Secondary | ICD-10-CM | POA: Insufficient documentation

## 2021-04-19 DIAGNOSIS — Z7982 Long term (current) use of aspirin: Secondary | ICD-10-CM | POA: Insufficient documentation

## 2021-04-19 DIAGNOSIS — R000 Tachycardia, unspecified: Secondary | ICD-10-CM | POA: Insufficient documentation

## 2021-04-19 DIAGNOSIS — Z91040 Latex allergy status: Secondary | ICD-10-CM | POA: Insufficient documentation

## 2021-04-19 DIAGNOSIS — L03115 Cellulitis of right lower limb: Secondary | ICD-10-CM | POA: Insufficient documentation

## 2021-04-19 DIAGNOSIS — Z20822 Contact with and (suspected) exposure to covid-19: Secondary | ICD-10-CM | POA: Insufficient documentation

## 2021-04-19 HISTORY — DX: Pain in left lower leg: M79.662

## 2021-04-19 LAB — BASIC METABOLIC PANEL
Anion gap: 10 (ref 5–15)
BUN: 21 mg/dL — ABNORMAL HIGH (ref 6–20)
CO2: 23 mmol/L (ref 22–32)
Calcium: 8.5 mg/dL — ABNORMAL LOW (ref 8.9–10.3)
Chloride: 101 mmol/L (ref 98–111)
Creatinine, Ser: 1.54 mg/dL — ABNORMAL HIGH (ref 0.44–1.00)
GFR, Estimated: 39 mL/min — ABNORMAL LOW (ref >60)
Glucose, Bld: 358 mg/dL — ABNORMAL HIGH (ref 70–99)
Potassium: 4 mmol/L (ref 3.5–5.1)
Sodium: 134 mmol/L — ABNORMAL LOW (ref 135–145)

## 2021-04-19 LAB — CBC WITH DIFFERENTIAL/PLATELET
Abs Immature Granulocytes: 0.09 10*3/uL — ABNORMAL HIGH (ref 0.00–0.07)
Basophils Absolute: 0 10*3/uL (ref 0.0–0.1)
Basophils Relative: 0 %
Eosinophils Absolute: 0 10*3/uL (ref 0.0–0.5)
Eosinophils Relative: 0 %
HCT: 38.4 % (ref 36.0–46.0)
Hemoglobin: 12.8 g/dL (ref 12.0–15.0)
Immature Granulocytes: 1 %
Lymphocytes Relative: 15 %
Lymphs Abs: 2.3 10*3/uL (ref 0.7–4.0)
MCH: 29.6 pg (ref 26.0–34.0)
MCHC: 33.3 g/dL (ref 30.0–36.0)
MCV: 88.7 fL (ref 80.0–100.0)
Monocytes Absolute: 1.2 10*3/uL — ABNORMAL HIGH (ref 0.1–1.0)
Monocytes Relative: 8 %
Neutro Abs: 11.5 10*3/uL — ABNORMAL HIGH (ref 1.7–7.7)
Neutrophils Relative %: 76 %
Platelets: 205 10*3/uL (ref 150–400)
RBC: 4.33 MIL/uL (ref 3.87–5.11)
RDW: 14.5 % (ref 11.5–15.5)
WBC: 15.1 10*3/uL — ABNORMAL HIGH (ref 4.0–10.5)
nRBC: 0 % (ref 0.0–0.2)

## 2021-04-19 LAB — LACTIC ACID, PLASMA: Lactic Acid, Venous: 1.2 mmol/L (ref 0.5–1.9)

## 2021-04-19 LAB — RESP PANEL BY RT-PCR (FLU A&B, COVID) ARPGX2
Influenza A by PCR: NEGATIVE
Influenza B by PCR: NEGATIVE
SARS Coronavirus 2 by RT PCR: NEGATIVE

## 2021-04-19 MED ORDER — ACETAMINOPHEN 325 MG PO TABS
650.0000 mg | ORAL_TABLET | Freq: Once | ORAL | Status: AC
Start: 2021-04-19 — End: 2021-04-19
  Administered 2021-04-19: 650 mg via ORAL
  Filled 2021-04-19: qty 2

## 2021-04-19 MED ORDER — SODIUM CHLORIDE 0.9 % IV BOLUS
1000.0000 mL | Freq: Once | INTRAVENOUS | Status: AC
  Administered 2021-04-19: 1000 mL via INTRAVENOUS

## 2021-04-19 MED ORDER — SODIUM CHLORIDE 0.9 % IV SOLN
1.0000 g | Freq: Once | INTRAVENOUS | Status: AC
  Administered 2021-04-19: 1 g via INTRAVENOUS
  Filled 2021-04-19: qty 10

## 2021-04-19 MED ORDER — DOXYCYCLINE HYCLATE 100 MG PO CAPS: 100.0000 mg | ORAL_CAPSULE | Freq: Two times a day (BID) | ORAL | 0 refills | Status: DC

## 2021-04-19 MED ORDER — FENTANYL CITRATE PF 50 MCG/ML IJ SOSY
50.0000 ug | PREFILLED_SYRINGE | Freq: Once | INTRAMUSCULAR | Status: AC
  Administered 2021-04-19: 50 ug via INTRAVENOUS
  Filled 2021-04-19: qty 1

## 2021-04-19 NOTE — Assessment & Plan Note (Signed)
Ordered US to be done down stairs but no appointment available until tomorrow ?So cma brought pt to ER to be evaluated  ?

## 2021-04-19 NOTE — Telephone Encounter (Signed)
Pt scheduled with Dr.Lowne today ?

## 2021-04-19 NOTE — ED Provider Notes (Signed)
[facility] EMERGENCY DEPARTMENT Provider Note   CSN: [id number] Arrival date & time: 04/19/21  1443    History  Chief Complaint  Patient presents with   Leg Pain    [name] is a 56 y.o. female here for evaluation of left calf pain and feeling unwell. Also with congestion and rhinorrhea for a few days. Noted redness and pain to left lateral aspect calf. No hx of PE, DVT. Denies CP, SOB. No recent surgery or immobilization. No recent covid exposures. Feels generally unwell. Seen by PCP today, unable to get [place] outpatient to R/O DVT and sent here for further evaluation. No HA, neck pain, neck stiffness, CP, SOB, ABD pain, numbness, weakness. No meds PTA. Did not know she had a fever until arrival.  HPI     Home Medications Prior to Admission medications   Medication Sig Start Date End Date Taking? Authorizing Provider  doxycycline (VIBRAMYCIN) 100 MG capsule Take 1 capsule (100 mg total) by mouth 2 (two) times daily. 04/19/21  Yes Krystyna Cleckley A, PA-C  albuterol (VENTOLIN HFA) 108 (90 Base) MCG/ACT inhaler Inhale 2 puffs into the lungs every 6 (six) hours as needed for wheezing or shortness of breath. 08/03/20   Saguier, Percell Miller, PA-C  amLODipine (NORVASC) 10 MG tablet Take 1 tablet by mouth once daily 02/05/21   Saguier, Percell Miller, PA-C  aspirin 81 MG tablet Take 81 mg by mouth daily.    [provider]  atorvastatin (LIPITOR) 80 MG tablet Take 1 tablet by mouth once daily 08/06/20   Saguier, Percell Miller, PA-C  azelastine (ASTELIN) 0.1 % nasal spray Place 2 sprays into both nostrils 2 (two) times daily. 04/09/20   Colon Branch, MD  busPIRone (BUSPAR) 15 MG tablet Take 1 tablet (15 mg total) by mouth 2 (two) times daily. 10/30/20   Saguier, Percell Miller, PA-C  carvedilol (COREG) 25 MG tablet Take 1 tablet (25 mg total) by mouth 2 (two) times daily with a meal. 12/17/20   Saguier, Percell Miller, PA-C  Cholecalciferol (VITAMIN D3) 10 MCG (400 UNIT) CAPS Take 400 mg by mouth daily.  12/17/20   Saguier, Percell Miller, PA-C  famotidine (PEPCID) 20 MG tablet Take 1 tablet (20 mg total) by mouth daily. 04/03/21   Saguier, Percell Miller, PA-C  FARXIGA 10 MG TABS tablet Take 1 tablet by mouth once daily 07/21/19   Saguier, Percell Miller, PA-C  fluticasone [facility]) 50 MCG/ACT nasal spray Place 2 sprays into both nostrils daily. 09/27/19   Saguier, Percell Miller, PA-C  fluticasone furoate-vilanterol (BREO ELLIPTA) 100-25 MCG/INH AEPB Inhale 1 puff into the lungs daily. 08/03/20   Saguier, Percell Miller, PA-C  gabapentin (NEURONTIN) 300 MG capsule Take 2 capsules (600 mg total) by mouth 3 (three) times daily. 12/17/20   Saguier, Percell Miller, PA-C  insulin degludec (TRESIBA) 100 UNIT/ML FlexTouch Pen Inject 40 Units into the skin daily at 10 pm. 11/16/20   Saguier, Percell Miller, PA-C  meloxicam (MOBIC) 7.5 MG tablet Take 1-2 tablets (7.5-15 mg total) by mouth daily. 03/21/21   Saguier, Percell Miller, PA-C  metFORMIN (GLUCOPHAGE-XR) 500 MG 24 hr tablet Take 1 tablet (500 mg total) by mouth daily with breakfast. 01/15/21   Saguier, Percell Miller, PA-C  methocarbamol (ROBAXIN) 750 MG tablet Take 1 tablet (750 mg total) by mouth every 8 (eight) hours as needed for muscle spasms. 04/01/21   Saguier, Percell Miller, PA-C  nitroGLYCERIN (NITROSTAT) 0.4 MG SL tablet Place 1 tablet (0.4 mg total) under the tongue every 5 (five) minutes as needed for chest pain. 03/21/21   Saguier,  Percell Miller, PA-C  oxyCODONE (ROXICODONE) 5 MG immediate release tablet Take 1 tablet (5 mg total) by mouth every 6 (six) hours as needed for severe pain. 03/21/21   Saguier, Percell Miller, PA-C  promethazine (PHENERGAN) 12.5 MG tablet Take 1 tablet (12.5 mg total) by mouth every 8 (eight) hours as needed. 12/17/20   Saguier, Percell Miller, PA-C  promethazine (PHENERGAN) 12.5 MG tablet Take 1 tablet (12.5 mg total) by mouth every 8 (eight) hours as needed for nausea or vomiting. 04/03/21   Saguier, Percell Miller, PA-C  rizatriptan (MAXALT-MLT) 10 MG disintegrating tablet Take 10 mg by mouth daily as needed for migraine. 04/17/20    [provider]  sertraline (ZOLOFT) 100 MG tablet Take 1 tablet (100 mg total) by mouth daily. 08/03/20   Saguier, Percell Miller, PA-C  spironolactone (ALDACTONE) 25 MG tablet Take 1 tablet by mouth once daily 07/21/19   Saguier, Percell Miller, PA-C  topiramate (TOPAMAX) 50 MG tablet Take 1 tablet (50 mg total) by mouth 2 (two) times daily. 04/17/20   Penumalli, Earlean Polka, MD      Allergies    Insulin aspart, Latex, and Morphine    Review of Systems   Review of Systems  Constitutional:  Positive for fatigue.  HENT:  Positive for congestion, postnasal drip and rhinorrhea. Negative for sore throat, trouble swallowing and voice change.   Respiratory: Negative.    Cardiovascular: Negative.   Gastrointestinal: Negative.   Genitourinary: Negative.   Musculoskeletal:  Negative for neck stiffness.       Left lateral calf pain  Skin:  Positive for wound.  Neurological: Negative.   Hematological: Negative.   All other systems reviewed and are negative.  Physical Exam Updated Vital Signs BP 128/85 (BP Location: Left Arm)    Pulse (!) 108    Temp (!) 102.9 F (39.4 C)    Resp 18    Ht 6' (1.829 m)    Wt 76.2 kg    SpO2 98%    BMI 22.78 kg/m  Physical Exam Vitals and nursing note reviewed.  Constitutional:      General: She is not in acute distress.    Appearance: She is well-developed. She is not ill-appearing.  HENT:     Head: Atraumatic.     Jaw: There is normal jaw occlusion.     Nose: Congestion and rhinorrhea present. Rhinorrhea is purulent.     Right Turbinates: Enlarged.     Left Turbinates: Enlarged.     Right Sinus: No maxillary sinus tenderness or frontal sinus tenderness.     Left Sinus: No maxillary sinus tenderness or frontal sinus tenderness.     Comments: Congestion, yellow rhinorrhea bilateral nares.  Large turbinates bilaterally    Mouth/Throat:     Lips: Pink.     Mouth: Mucous membranes are moist.     Pharynx: Oropharynx is clear.     Tonsils: No tonsillar exudate or  tonsillar abscesses.     Comments: Posterior pharynx clear, tongue midline.  No PTA, RPA, no tonsillar edema, exudate Eyes:     Pupils: Pupils are equal, round, and reactive to light.  Neck:     Trachea: Trachea and phonation normal.     Comments: No neck stiffness neck rigidity.  No meningismus Cardiovascular:     Rate and Rhythm: Tachycardia present.     Pulses: Normal pulses.          Dorsalis pedis pulses are 2+ on the right side and 2+ on the left side.  Posterior tibial pulses are 2+ on the right side and 2+ on the left side.     Heart sounds: Normal heart sounds.  Pulmonary:     Effort: Pulmonary effort is normal. No respiratory distress.     Breath sounds: Normal breath sounds and air entry.     Comments: Clear bil speaks in full sentences without difficulty Chest:     Comments: Non tender chest wall Abdominal:     General: Bowel sounds are normal. There is no distension.     Tenderness: There is no abdominal tenderness. There is no guarding or rebound.  Musculoskeletal:        General: Normal range of motion.     Cervical back: Full passive range of motion without pain and normal range of motion.     Right upper leg: Normal.     Left upper leg: Normal.     Right knee: Normal.     Left knee: Normal.     Right lower leg: Normal.     Left lower leg: Tenderness present.     Right ankle: Normal.     Left ankle: Normal.     Right foot: Normal.     Left foot: Normal.       Legs:     Comments: Redness and warmth to left lateral calf. Compartment soft. Non bony tenderness. Full ROM to ankle/ knee without edema, erythema, warmth to anterior knee. No obvious effusion. No crepitus to skin. No midline C/T/L tenderness  Skin:    General: Skin is warm and dry.     Capillary Refill: Capillary refill takes less than 2 seconds.     Findings: Erythema present.     Comments: Erythema, warmth to left lateral calf.  No fluctuance, induration, streaking.  No lesions  Neurological:      General: No focal deficit present.     Mental Status: She is alert.     Sensory: Sensation is intact.     Motor: Motor function is intact.     Gait: Gait is intact.     Comments: Ambulatory Equal strength Intact sensation  Psychiatric:        Mood and Affect: Mood normal.    ED Results / Procedures / Treatments   Labs (all labs ordered are listed, but only abnormal results are displayed) Labs Reviewed  CBC WITH DIFFERENTIAL/PLATELET - Abnormal; Notable for the following components:      Result Value   WBC 15.1 (*)    Neutro Abs 11.5 (*)    Monocytes Absolute 1.2 (*)    Abs Immature Granulocytes 0.09 (*)    All other components within normal limits  BASIC METABOLIC PANEL - Abnormal; Notable for the following components:   Sodium 134 (*)    Glucose, Bld 358 (*)    BUN 21 (*)    Creatinine, Ser 1.54 (*)    Calcium 8.5 (*)    GFR, Estimated 39 (*)    All other components within normal limits  RESP PANEL BY RT-PCR (FLU A&B, COVID) ARPGX2  CULTURE, BLOOD (ROUTINE X 2)  CULTURE, BLOOD (ROUTINE X 2)  LACTIC ACID, PLASMA  LACTIC ACID, PLASMA    EKG None  Radiology US Venous Img Lower Unilateral Left  Result Date: 04/19/2021 CLINICAL DATA:  Left leg pain for 1 day.  Fever. EXAM: LEFT LOWER EXTREMITY VENOUS DOPPLER ULTRASOUND TECHNIQUE: Gray-scale sonography with compression, as well as color and duplex ultrasound, were performed to evaluate the deep venous system(s) from the  level of the common femoral vein through the popliteal and proximal calf veins. COMPARISON:  09/24/2017 FINDINGS: VENOUS Normal compressibility of the common femoral, superficial femoral, and popliteal veins, as well as the visualized calf veins. Visualized portions of profunda femoral vein and great saphenous vein unremarkable. No filling defects to suggest DVT on grayscale or color Doppler imaging. Doppler waveforms show normal direction of venous flow, normal respiratory plasticity and response to  augmentation. Limited views of the contralateral common femoral vein are unremarkable. OTHER Lymph nodes along the left groin have fatty hila, index node short axis diameter 0.8 cm, within normal limits. Along the left lateral upper calf, a subcutaneous venous varicosity is noted and does not appear to be thrombosed for example on image 60 series 1. Limitations: none IMPRESSION: 1. No DVT identified in the left lower extremity. 2. Along the left lateral upper calf, patent subcutaneous venous varicosity noted. Electronically Signed   By: Van Clines M.D.   On: 04/19/2021 16:51    Procedures Procedures    Medications Ordered in ED Medications  acetaminophen (TYLENOL) tablet 650 mg (650 mg Oral Given 04/19/21 1530)  sodium chloride 0.9 % bolus 1,000 mL (1,000 mLs Intravenous New Bag/Given 04/19/21 1655)  cefTRIAXone (ROCEPHIN) 1 g in sodium chloride 0.9 % 100 mL IVPB (1 g Intravenous New Bag/Given 04/19/21 1730)  fentaNYL (SUBLIMAZE) injection 50 mcg (50 mcg Intravenous Given 04/19/21 1726)    ED Course/ Medical Decision Making/ A&P    56 year old here for evaluation of left lateral calf pain.  Sent by PCP who saw patient earlier today with concern for DVT.  Patient denies any chest pain, shortness of breath.  She does admit to some vague upper respiratory symptoms with rhinorrhea, congestion, sinus pressure and intermittent cough.  She denies any prior history of PE, DVT, recent surgery, lesion or malignancy.  On arrival she is febrile and tachycardic however appears otherwise well.  She has full range of motion to her joints.  No overlying erythema, warmth to her joints however does have erythema, warmth and significant tenderness to her left lateral calf.  No streaking, lesions.  Neurovascularly intact, ambulatory.  Patient did not know she had a fever until arrival.  We will plan on labs, imaging and reassess.  Labs and imaging personally viewed and interpreted:  CBC leukocytosis 15.1 BMP  sodium 134, glucose 358, creatinine 1.54 [place] negative for DVT, does show venous varicosities BC pending COVID/Flu neg Lactic 1.2  Patient reassessed.  Discussed labs and imaging.  She was given pain control, defervesced while here in the emergency department.  Also gave IV antibiotics.  Start her on p.o. antibiotics, discussed close monitoring of site for likely cellulitis.  At this time I low suspicion for deep space infection, abscess, myositis, bony infection, gas forming organism, PE, DVT, ischemia.  We will have her follow-up with PCP early next week patient agreeable.  She will return for any worsening symptoms  The patient has been appropriately medically screened and/or stabilized in the ED. I have low suspicion for any other emergent medical condition which would require further screening, evaluation or treatment in the ED or require inpatient management.  Patient is hemodynamically stable and in no acute distress.  Patient able to ambulate in department prior to ED.  Evaluation does not show acute pathology that would require ongoing or additional emergent interventions while in the emergency department or further inpatient treatment.  I have discussed the diagnosis with the patient and answered all questions.  Pain  is been managed while in the emergency department and patient has no further complaints prior to discharge.  Patient is comfortable with plan discussed in room and is stable for discharge at this time.  I have discussed strict return precautions for returning to the emergency department.  Patient was encouraged to follow-up with PCP/specialist refer to at discharge.                            Medical Decision Making Amount and/or Complexity of Data Reviewed External Data Reviewed: labs and notes. Labs: ordered. Decision-making details documented in ED Course. Radiology: ordered and independent interpretation performed. Decision-making details documented in ED  Course.  Risk OTC drugs. Prescription drug management.         Final Clinical Impression(s) / ED Diagnoses Final diagnoses:  Cellulitis of right lower extremity    Rx / DC Orders ED Discharge Orders          Ordered    doxycycline (VIBRAMYCIN) 100 MG capsule  2 times daily        04/19/21 1809              Kirklin Mcduffee A, PA-C 04/19/21 1814    Charlesetta Shanks, MD 04/19/21 2150

## 2021-04-19 NOTE — Progress Notes (Signed)
Subjective:   By signing my name below, I, Zite Okoli, attest that this documentation has been prepared under the direction and in the presence of Ann Held, DO. 04/19/2021    Patient ID: [name], female    DOB: 1965/08/28, 56 y.o.   MRN: [id number]  Chief Complaint  Patient presents with   Rash    Left leg, pt states leg started feeling sore last night and has some swelling and redness    HPI Patient is in today for an office visit.  She is complaining of pain in her left knee that is localized to the back of her knee and started yesterday. The pain is associated with erythema and swelling in the lower left leg. She denies chest pain and shortness of breath. She walks a lot at work. She has not used any medication to manage the pain.  She checks her sugars at home and they have been okay.  Past Medical History:  Diagnosis Date   ABDOMINAL PAIN-EPIGASTRIC 09/14/2009   Qualifier: Diagnosis of  By: Trellis Paganini PA-c, [name]    Acute cystitis 07/07/2017   ANKLE PAIN, LEFT 12/25/2009   Qualifier: Diagnosis of  By: Amil Amen MD, [name]     Anxiety 01/22/2016   CAD (coronary artery disease) 07/07/2017   Cardiac murmur 02/01/2019   Cardiomyopathy, secondary ([place]) 09/06/2009   Qualifier: Diagnosis of  By: Arvid Right   Formatting of this note might be different from the original. Overview:  Qualifier: Diagnosis of  By: Arvid Right   Cardiomyopathy, unspecified ([place]) 09/06/2009   Formatting of this note might be different from the original. Overview:  Overview:  Qualifier: Diagnosis of  By: Arvid Right  Overview:  Overview:  Qualifier: Diagnosis of  By: Arvid Right Formatting of this note might be different from the original. Overview:  Qualifier: Diagnosis of  By: Arvid Right   Cerebrovascular disease 09/13/2012   Cervical cancer ([place])    had surgery in 2001.   CHEST PAIN, ATYPICAL  07/30/2009   Qualifier: Diagnosis of  By: Amil Amen MD, Winona Legato of this note might be different from the original. Overview:  Qualifier: Diagnosis of  By: Amil Amen MD, [name]   Chronic back pain    Chronic kidney disease, stage III (moderate) ([place]) 05/23/2014   Chronic pain syndrome 01/23/2016   Coronary artery disease    30% lesions noted   Current use of insulin ([place]) 05/23/2014   Depression    Diabetes mellitus    DIABETES MELLITUS, TYPE II, UNCONTROLLED 07/30/2009   Qualifier: Diagnosis of  By: Amil Amen MD, [name]     Diabetes type 2, uncontrolled 05/23/2014   Diabetic gastroparesis associated with type 1 diabetes mellitus ([place]) 11/27/2016   DIABETIC PERIPHERAL NEUROPATHY 09/21/2009   Qualifier: Diagnosis of  By: Amil Amen MD, [name]     Diabetic peripheral neuropathy ([place]) 01/23/2016   Dysuria 12/25/2009   Qualifier: Diagnosis of  By: Amil Amen MD, [name]     Elevation of level of transaminase or lactic acid dehydrogenase (LDH) 09/21/2009   Formatting of this note might be different from the original. Overview:  Qualifier: Diagnosis of  By: Amil Amen MD, [place]   Essential hypertension 08/30/2009   Qualifier: Diagnosis of  By: Lovette Cliche, CNA, Christy     Gastric atony 07/30/2009   Formatting of this note might be different from the original. Overview:  Qualifier: Diagnosis of  By: Amil Amen MD, Benjamine Mola  Gastroesophageal reflux disease 07/30/2009   Formatting of this note might be different from the original. Overview:  Overview:  Qualifier: Diagnosis of  By: Amil Amen MD, Elby Showers: Diagnosis of  By: Chester Holstein NP, Ellwood Handler of this note might be different from the original. Overview:  Qualifier: Diagnosis of  By: Amil Amen MD, Elby Showers: Diagnosis of  By: Chester Holstein NP, Nevin Bloodgood   Gastroparesis 07/30/2009   Qualifier: Diagnosis of  By: Amil Amen MD, [place]     GERD 07/30/2009   Qualifier: Diagnosis of  By: Amil Amen MD, Elby Showers:  Diagnosis of  By: Chester Holstein NP, Nevin Bloodgood     GERD (gastroesophageal reflux disease)    History of cervical cancer 08/17/2015   Status post partial hysterectomy  Formatting of this note might be different from the original. Status post partial hysterectomy   HLD (hyperlipidemia) 07/30/2009   Qualifier: Diagnosis of  By: Amil Amen MD, [name]     Hyperlipidemia    Hyperlipidemia, unspecified 07/30/2009   Formatting of this note might be different from the original. Overview:  Qualifier: Diagnosis of  By: Amil Amen MD, Winona Legato of this note might be different from the original. Overview:  Qualifier: Diagnosis of  By: Amil Amen MD, [name]   Hypertension    INSOMNIA 09/21/2009   Qualifier: Diagnosis of  By: Amil Amen MD, [name]     Lumbar facet arthropathy 05/14/2016   Lumbar spinal stenosis 02/11/2018   Metatarsalgia of both feet 03/11/2017   Migraine 09/13/2012   Overview:  IMPRESSION: possible abd migraine   Mixed dyslipidemia 02/01/2019   Nausea & vomiting 07/07/2017   Nausea with vomiting, unspecified 09/14/2009   Qualifier: Diagnosis of  By: Shane Crutch, [name]   Formatting of this note might be different from the original. Overview:  Qualifier: Diagnosis of  By: Shane Crutch, [name]   Neuropathy    Nonspecific abnormal findings on radiological and examination of skull and head 09/13/2012   Overweight (BMI 25.0-29.9) 07/21/2014   POSITIVE PPD 07/30/2009   Annotation: CXR clear ?  diagnosed in 2/11?  On INH/pyridoxine Qualifier: Diagnosis of  By: Amil Amen MD, [name]     Pure hypercholesterolemia 11/27/2016   Retention of urine 06/25/2011   Right flank pain    TOBACCO ABUSE 09/21/2009   Qualifier: Diagnosis of  By: Amil Amen MD, Winona Legato of this note might be different from the original. Overview:  Qualifier: Diagnosis of  By: Amil Amen MD, Elinor Parkinson, SERUM, ELEVATED 09/21/2009   Qualifier: Diagnosis of  By: Amil Amen MD, [name]     Trigeminal neuralgia     Tuberculin test reaction 07/30/2009   Formatting of this note might be different from the original. Overview:  Annotation: CXR clear ?  diagnosed in 2/11?  On INH/pyridoxine Qualifier: Diagnosis of  By: Amil Amen MD, [name]   Type 2 diabetes mellitus with hyperglycemia ([place]) 07/30/2009   Qualifier: Diagnosis of  By: Amil Amen MD, Winona Legato of this note might be different from the original. Overview:  Qualifier: Diagnosis of  By: Amil Amen MD, [name]   Type 2 diabetes mellitus with hyperglycemia, with long-term current use of insulin ([place]) 07/30/2009   Formatting of this note might be different from the original. Overview:  05/30/2015 A1C was 16.7% (!)  Overview:  05/30/2015 A1C was 16.7% Kingsley Callander of this note might be different from the original. 05/30/2015 A1C was 16.7% (!)   Type 2 diabetes mellitus with stage 3 chronic kidney disease ([place]) 09/14/2009  Qualifier: Diagnosis of  By: Shane Crutch, [name]    ULCER-GASTRIC 09/28/2009   Qualifier: Diagnosis of  By: Chester Holstein NP, Nevin Bloodgood     URINARY TRACT INFECTION 09/21/2009   Qualifier: Diagnosis of  By: Amil Amen MD, Louanne Belton, CANDIDAL 12/25/2009   Qualifier: Diagnosis of  By: Amil Amen MD, Tessa Lerner D deficiency 04/25/2013    Past Surgical History:  Procedure Laterality Date   ABDOMINAL HYSTERECTOMY     partial   CHOLECYSTECTOMY      Family History  Problem Relation Age of Onset   Diabetes Mother    Diabetes Father     Social History   Socioeconomic History   Marital status: Single    Spouse name: Not on file   Number of children: Not on file   Years of education: Not on file   Highest education level: Not on file  Occupational History   Not on file  Tobacco Use   Smoking status: Never   Smokeless tobacco: Never  Vaping Use   Vaping Use: Never used  Substance and Sexual Activity   Alcohol use: Yes    Comment: occ   Drug use: No   Sexual activity: Not on file  Other Topics Concern    Not on file  Social History Narrative   Not on file   Social Determinants of Health   Financial Resource Strain: Not on file  Food Insecurity: Not on file  Transportation Needs: Not on file  Physical Activity: Not on file  Stress: Not on file  Social Connections: Not on file  Intimate Partner Violence: Not on file    Outpatient Medications Prior to Visit  Medication Sig Dispense Refill   albuterol (VENTOLIN HFA) 108 (90 Base) MCG/ACT inhaler Inhale 2 puffs into the lungs every 6 (six) hours as needed for wheezing or shortness of breath. 18 g 2   amLODipine (NORVASC) 10 MG tablet Take 1 tablet by mouth once daily 90 tablet 0   aspirin 81 MG tablet Take 81 mg by mouth daily.     atorvastatin (LIPITOR) 80 MG tablet Take 1 tablet by mouth once daily 60 tablet 0   azelastine (ASTELIN) 0.1 % nasal spray Place 2 sprays into both nostrils 2 (two) times daily. 30 mL 1   busPIRone (BUSPAR) 15 MG tablet Take 1 tablet (15 mg total) by mouth 2 (two) times daily. 60 tablet 3   carvedilol (COREG) 25 MG tablet Take 1 tablet (25 mg total) by mouth 2 (two) times daily with a meal. 180 tablet 0   Cholecalciferol (VITAMIN D3) 10 MCG (400 UNIT) CAPS Take 400 mg by mouth daily. 150 capsule 0   famotidine (PEPCID) 20 MG tablet Take 1 tablet (20 mg total) by mouth daily. 30 tablet 0   FARXIGA 10 MG TABS tablet Take 1 tablet by mouth once daily 90 tablet 0   fluticasone (FLONASE) 50 MCG/ACT nasal spray Place 2 sprays into both nostrils daily. 16 g 1   fluticasone furoate-vilanterol (BREO ELLIPTA) 100-25 MCG/INH AEPB Inhale 1 puff into the lungs daily. 100 each 2   gabapentin (NEURONTIN) 300 MG capsule Take 2 capsules (600 mg total) by mouth 3 (three) times daily. 270 capsule 0   insulin degludec (TRESIBA) 100 UNIT/ML FlexTouch Pen Inject 40 Units into the skin daily at 10 pm. 100 mL 2   meloxicam (MOBIC) 7.5 MG tablet Take 1-2 tablets (7.5-15 mg total) by mouth daily. 60 tablet 0  metFORMIN (GLUCOPHAGE-XR)  500 MG 24 hr tablet Take 1 tablet (500 mg total) by mouth daily with breakfast. 60 tablet 2   methocarbamol (ROBAXIN) 750 MG tablet Take 1 tablet (750 mg total) by mouth every 8 (eight) hours as needed for muscle spasms. 30 tablet 0   nitroGLYCERIN (NITROSTAT) 0.4 MG SL tablet Place 1 tablet (0.4 mg total) under the tongue every 5 (five) minutes as needed for chest pain. 30 tablet 3   oxyCODONE (ROXICODONE) 5 MG immediate release tablet Take 1 tablet (5 mg total) by mouth every 6 (six) hours as needed for severe pain. 30 tablet 0   promethazine (PHENERGAN) 12.5 MG tablet Take 1 tablet (12.5 mg total) by mouth every 8 (eight) hours as needed. 30 tablet 0   promethazine (PHENERGAN) 12.5 MG tablet Take 1 tablet (12.5 mg total) by mouth every 8 (eight) hours as needed for nausea or vomiting. 12 tablet 0   rizatriptan (MAXALT-MLT) 10 MG disintegrating tablet Take 10 mg by mouth daily as needed for migraine.     sertraline (ZOLOFT) 100 MG tablet Take 1 tablet (100 mg total) by mouth daily. 30 tablet 3   spironolactone (ALDACTONE) 25 MG tablet Take 1 tablet by mouth once daily 30 tablet 0   topiramate (TOPAMAX) 50 MG tablet Take 1 tablet (50 mg total) by mouth 2 (two) times daily. 60 tablet 12   No facility-administered medications prior to visit.    Allergies  Allergen Reactions   Insulin Aspart Other (See Comments) and Swelling    adverse reaction to Novolog    Latex Itching and Rash   Morphine Itching and Rash    Review of Systems  Constitutional:  Negative for fever.  HENT:  Negative for congestion, ear pain, hearing loss, sinus pain and sore throat.   Eyes:  Negative for blurred vision and pain.  Respiratory:  Negative for cough, sputum production, shortness of breath and wheezing.   Cardiovascular:  Negative for chest pain and palpitations.  Gastrointestinal:  Negative for blood in stool, constipation, diarrhea, nausea and vomiting.  Genitourinary:  Negative for dysuria, frequency,  hematuria and urgency.  Musculoskeletal:  Negative for back pain, falls and myalgias.  Neurological:  Negative for dizziness, sensory change, loss of consciousness, weakness and headaches.  Endo/Heme/Allergies:  Negative for environmental allergies. Does not bruise/bleed easily.  Psychiatric/Behavioral:  Negative for depression and suicidal ideas. The patient is not nervous/anxious and does not have insomnia.       Objective:    Physical Exam Vitals and nursing note reviewed.  Constitutional:      General: She is not in acute distress.    Appearance: Normal appearance. She is ill-appearing.  HENT:     Head: Normocephalic and atraumatic.     Right Ear: External ear normal.     Left Ear: External ear normal.  Eyes:     Extraocular Movements: Extraocular movements intact.     Pupils: Pupils are equal, round, and reactive to light.  Cardiovascular:     Rate and Rhythm: Normal rate and regular rhythm.     Pulses: Normal pulses.     Heart sounds: Normal heart sounds. No murmur heard.   No gallop.  Pulmonary:     Effort: Pulmonary effort is normal. No respiratory distress.     Breath sounds: Normal breath sounds. No wheezing, rhonchi or rales.  Abdominal:     General: Bowel sounds are normal. There is no distension.     Palpations: Abdomen is soft. There  is no mass.     Tenderness: There is no abdominal tenderness. There is no guarding or rebound.     Hernia: No hernia is present.  Musculoskeletal:        General: Tenderness present.     Cervical back: Normal range of motion and neck supple.     Comments: L calf pain with palpation and errythema    Lymphadenopathy:     Cervical: No cervical adenopathy.  Skin:    General: Skin is warm and dry.     Findings: Erythema present.  Neurological:     Mental Status: She is alert and oriented to person, place, and time.  Psychiatric:        Behavior: Behavior normal.    BP 118/84 (BP Location: Left Arm, Patient Position: Sitting,  Cuff Size: Normal)    Pulse (!) 110    Temp 98.9 F (37.2 C) (Oral)    Resp 18    Ht '6\' 1"'$  (1.854 m)    Wt 168 lb 6.4 oz (76.4 kg)    SpO2 96%    BMI 22.22 kg/m  Wt Readings from Last 3 Encounters:  04/19/21 168 lb (76.2 kg)  04/19/21 168 lb 6.4 oz (76.4 kg)  04/03/21 175 lb (79.4 kg)    Diabetic Foot Exam - Simple   No data filed    Lab Results  Component Value Date   WBC 5.5 04/03/2021   HGB 13.5 04/03/2021   HCT 41.0 04/03/2021   PLT 222.0 04/03/2021   GLUCOSE 228 (H) 04/03/2021   CHOL 204 (H) 08/24/2020   TRIG 67.0 08/24/2020   HDL 64.90 08/24/2020   LDLCALC 125 (H) 08/24/2020   ALT 19 04/03/2021   AST 19 04/03/2021   NA 138 04/03/2021   K 3.7 04/03/2021   CL 105 04/03/2021   CREATININE 1.05 04/03/2021   BUN 19 04/03/2021   CO2 28 04/03/2021   TSH 1.13 08/24/2020   INR 1.02 09/12/2009   HGBA1C 7.9 (H) 08/24/2020    Lab Results  Component Value Date   TSH 1.13 08/24/2020   Lab Results  Component Value Date   WBC 5.5 04/03/2021   HGB 13.5 04/03/2021   HCT 41.0 04/03/2021   MCV 89.7 04/03/2021   PLT 222.0 04/03/2021   Lab Results  Component Value Date   NA 138 04/03/2021   K 3.7 04/03/2021   CO2 28 04/03/2021   GLUCOSE 228 (H) 04/03/2021   BUN 19 04/03/2021   CREATININE 1.05 04/03/2021   BILITOT 0.5 04/03/2021   ALKPHOS 81 04/03/2021   AST 19 04/03/2021   ALT 19 04/03/2021   PROT 7.2 04/03/2021   ALBUMIN 3.9 04/03/2021   CALCIUM 8.6 04/03/2021   ANIONGAP 8 01/17/2021   GFR 59.49 (L) 04/03/2021   Lab Results  Component Value Date   CHOL 204 (H) 08/24/2020   Lab Results  Component Value Date   HDL 64.90 08/24/2020   Lab Results  Component Value Date   LDLCALC 125 (H) 08/24/2020   Lab Results  Component Value Date   TRIG 67.0 08/24/2020   Lab Results  Component Value Date   CHOLHDL 3 08/24/2020   Lab Results  Component Value Date   HGBA1C 7.9 (H) 08/24/2020       Assessment & Plan:   Problem List Items Addressed This Visit        Unprioritized   Pain of left calf - Primary    Ordered US to be done down stairs but  no appointment available until tomorrow So cma brought pt to ER to be evaluated       Relevant Orders   US Venous Img Lower Unilateral Left (DVT)    No orders of the defined types were placed in this encounter.   I,Zite Okoli,acting as a Education administrator for [employer], DO.,have documented all relevant documentation on the behalf of Ann Held, DO,as directed by  Ann Held, DO while in the presence of [place], DO. , personally preformed the services described in this documentation.  All medical record entries made by the scribe were at my direction and in my presence.  I have reviewed the chart and discharge instructions (if applicable) and agree that the record reflects my personal performance and is accurate and complete. 04/19/2021

## 2021-04-19 NOTE — ED Notes (Signed)
Patient discharged to home.  All discharge instructions reviewed.  Patient verbalized understanding via teachback method.  VS WDL.  Respirations even and unlabored.  Ambulatory out of ED.   °

## 2021-04-19 NOTE — ED Notes (Signed)
Spoke to patient who states she has nobody to take her home at this time.  Refuses to let staff call someone to come pick her up. ?

## 2021-04-19 NOTE — Telephone Encounter (Signed)
Nurse Assessment ?Nurse: Hardin Negus, RN, Mardene Celeste Date/Time [name] Time): 04/19/2021 9:55:49 AM ?Confirm and document reason for call. If ?symptomatic, describe symptoms. ?---She has had cold like s&s for a couple of days and ?had the chills. There are bumps on the back of her ?knees. She is eating and drinking well. Stuffy nose and ?congestion and a headache ?Does the patient have any new or worsening ?symptoms? ---Yes ?Will a triage be completed? ---Yes ?Related visit to physician within the last 2 weeks? ---No ?Does the PT have any chronic conditions? (i.e. ?diabetes, asthma, this includes High risk factors for ?pregnancy, etc.) ?---Yes ?List chronic conditions. ---diabetes. hypertension ?Is this a behavioral health or substance abuse call? ---No ?Guidelines ?Guideline Title Affirmed Question Affirmed Notes Nurse Date/Time (Eastern ?Time) ?Sinus Pain or ?Congestion ?[1] Using nasal ?washes and pain ?medicine > 24 hours ?AND [2] sinus pain ?(around cheekbone or ?eye) persists ?Hardin Negus, RNMardene Celeste 04/19/2021 9:58:23 ?AM ?Leg Pain [1] Thigh or calf pain ?AND [2] only 1 side ?AND [3] present > ?1 hour (Exception: ?Hardin Negus, RN, Mardene Celeste 04/19/2021 10:02:19 ?AM ?PLEASE NOTE: All timestamps contained within this report are represented as [place] Standard Time. ?CONFIDENTIALTY NOTICE: This fax transmission is intended only for the addressee. It contains information that is legally privileged, confidential or ?otherwise protected from use or disclosure. If you are not the intended recipient, you are strictly prohibited from reviewing, disclosing, copying using ?or disseminating any of this information or taking any action in reliance on or regarding this information. If you have received this fax in error, please ?notify us immediately by telephone so that we can arrange for its return to [place]. Phone: [phone], Toll-Free: [phone], Fax: [phone] ?Page: 2 of 2 ?Call Id: [id number] ?Guidelines ?Guideline Title  Affirmed Question Affirmed Notes Nurse Date/Time (Eastern ?Time) ?chronic unchanged ?pain) ?Disp. Time (Eastern ?Time) Disposition Final User ?04/19/2021 10:01:01 AM SEE PCP WITHIN 3 DAYS Hardin Negus, RN, Mardene Celeste ?04/19/2021 10:06:02 AM See HCP within 4 Hours (or ?PCP triage) ?Yes Hardin Negus, RN, Mardene Celeste ?Caller Disagree/Comply Comply ?Caller Understands Yes ?PreDisposition Call Doctor ?Care Advice Given Per Guideline ?SEE PCP WITHIN 3 DAYS: * You need to be seen within 2 or 3 days. NASAL WASHES FOR A STUFFY NOSE: CALL BACK ?IF: * Difficulty breathing (and not relieved by cleaning out nose) * You become worse CARE ADVICE given per Sinus Pain or ?Congestion (Adult) guideline. ?SEE HCP (OR PCP TRIAGE) WITHIN 4 HOURS: * IF OFFICE WILL BE OPEN: You need to be seen within the next 3 or 4 ?hours. Call your doctor (or NP/PA) now or as soon as the office opens. CALL BACK IF: * You become worse CARE ADVICE ?given per Leg Pain (Adult) guideline. CALL EMS IF: * You develop any chest pain or shortness of breath. ?Comments ?User: Ledora Bottcher, RN Date/Time [name] Time): 04/19/2021 10:02:08 AM ?The sore spot is on the outside of her left leg ?User: Ledora Bottcher, RN Date/Time [name] Time): 04/19/2021 10:08:39 AM ?04/19/21 @ 220PM ?Referrals ?REFERRED TO PCP OFFICE ?

## 2021-04-19 NOTE — Telephone Encounter (Signed)
Pt states she has two large lumps on the back of her leg, that are hard and hurt to touch. Transferred to triage.  ?

## 2021-04-19 NOTE — ED Notes (Signed)
Lb notified to add lactic acid ?

## 2021-04-19 NOTE — ED Triage Notes (Signed)
Pt c/o pain to left calf started last night-denies injury-states she was seen by PCP and needs US-NAD-to triage in w/c ?

## 2021-04-19 NOTE — Discharge Instructions (Signed)
Take the antibiotics as prescribed.  May also take Tylenol Motrin as needed for pain.  Keep a close eye on the area.  If it starts worsening please seek reevaluation otherwise follow-up with primary care provider early next week ?

## 2021-04-19 NOTE — Patient Instructions (Signed)
Deep Vein Thrombosis ?Deep vein thrombosis (DVT) is a condition in which a blood clot forms in a vein of the deep venous system. This can occur in the lower leg, thigh, pelvis, arm, or neck. A clot is blood that has thickened into a gel or solid. This condition is serious and can be life-threatening if the clot travels to the arteries of the lungs and causes a blockage (pulmonary embolism). A DVT can also damage veins in the leg, which can lead to long-term venous disease, leg pain, swelling, discoloration, and ulcers or sores (post-thrombotic syndrome). ?What are the causes? ?This condition may be caused by: ?A slowdown of blood flow. ?Damage to a vein. ?A condition that causes blood to clot more easily, such as certain bleeding disorders. ?What increases the risk? ?The following factors may make you more likely to develop this condition: ?Obesity. ?Being older, especially older than age 35. ?Being inactive or not moving around (sedentary lifestyle). This may include: ?Sitting or lying down for longer than 4-6 hours other than to sleep at night. ?Being in the hospital, or having major or lengthy surgery. ?Having any recent bone injuries, such as breaks (fractures), that reduce movement, especially in the lower extremities. ?Having recent orthopedic surgery on the lower extremities. ?Being pregnant, giving birth, or having recently given birth. ?Taking medicines that contain estrogen, such as birth control or hormone replacement therapy. ?Using products that contain nicotine or tobacco, especially if you use hormonal birth control. ?Having a history of a blood vessel disease (peripheral vascular disease) or congestive heart disease. ?Having a history of cancer, especially if being treated with chemotherapy. ?What are the signs or symptoms? ?Symptoms of this condition include: ?Swelling, pain, pressure, or tenderness in an arm or a leg. ?An arm or a leg becoming warm, red, or discolored. ?A leg turning very pale or  blue. You may have a large DVT. This is rare. ?If the clot is in your leg, you may notice that symptoms get worse when you stand or walk. ?In some cases, there are no symptoms. ?How is this diagnosed? ?This condition is diagnosed with: ?Your medical history and a physical exam. ?Tests, such as: ?Blood tests to check how well your blood clots. ?Doppler ultrasound. This is the best way to find a DVT. ?CT venogram. Contrast dye is injected into a vein, and X-rays are taken to check for clots. This is helpful for veins in the chest or pelvis. ?How is this treated? ?Treatment for this condition depends on: ?The cause of your DVT. ?The size and location of your DVT, or having more than one DVT. ?Your risk for bleeding or developing more clots. ?Other medical conditions you may have. ?Treatment may include: ?Taking a blood thinner medicine (anticoagulant) to prevent more clots from forming or current clots from growing. ?Wearing compression stockings. ?Injecting medicines into the affected vein to break up the clot (catheter-directed thrombolysis). ?Surgical procedures, when DVT is severe or hard to treat. These may be done to: ?Isolate and remove your clot. ?Place an inferior vena cava (IVC) filter. This filter is placed into a large vein called the inferior vena cava to catch blood clots before they reach your lungs. ?You may get some medical treatments for 6 months or longer. ?Follow these instructions at home: ?If you are taking blood thinners: ?Talk with your health care provider before you take any medicines that contain aspirin or NSAIDs, such as ibuprofen. These medicines increase your risk for dangerous bleeding. ?Take your medicine exactly as  told, at the same time every day. Do not skip a dose. Do not take more than the prescribed dose. This is important. ?Ask your health care provider about foods and medicines that could change or interact with the way your blood thinner works. Avoid these foods and medicines  if you are told to do so. ?Avoid anything that may cause bleeding or bruising. You may bleed more easily while taking blood thinners. ?Be very careful when using knives, scissors, or other sharp objects. ?Use an electric razor instead of a blade. ?Avoid activities that could cause injury or bruising, and follow instructions for preventing falls. ?Tell your health care provider if you have had any internal bleeding, bleeding ulcers, or neurologic diseases, such as strokes or cerebral aneurysms. ?Wear a medical alert bracelet or carry a card that lists what medicines you take. ?General instructions ?Take over-the-counter and prescription medicines only as told by your health care provider. ?Return to your normal activities as told by your health care provider. Ask your health care provider what activities are safe for you. ?If recommended, wear compression stockings as told by your health care provider. These stockings help to prevent blood clots and reduce swelling in your legs. Never wear your compression stockings while sleeping at night. ?Keep all follow-up visits. This is important. ?Where to find more information ?American Heart Association: www.heart.org ?Centers for Disease Control and Prevention: http://www.wolf.info/ ?National Heart, Lung, and Blood Institute: https://wilson-eaton.com/ ?Contact a health care provider if: ?You miss a dose of your blood thinner. ?You have unusual bruising or other color changes. ?You have new or worse pain, swelling, or redness in an arm or a leg. ?You have worsening numbness or tingling in an arm or a leg. ?You have a significant color change (pale or blue) in the extremity that has the DVT. ?Get help right away if: ?You have signs or symptoms that a blood clot has moved to the lungs. These may include: ?Shortness of breath. ?Chest pain. ?Fast or irregular heartbeats (palpitations). ?Light-headedness, dizziness, or fainting. ?Coughing up blood. ?You have signs or symptoms that your blood is  too thin. These may include: ?Blood in your vomit, stool, or urine. ?A cut that will not stop bleeding. ?A menstrual period that is heavier than usual. ?A severe headache or confusion. ?These symptoms may be an emergency. Get help right away. Call 911. ?Do not wait to see if the symptoms will go away. ?Do not drive yourself to the hospital. ?Summary ?Deep vein thrombosis (DVT) happens when a blood clot forms in a deep vein. This may occur in the lower leg, thigh, pelvis, arm, or neck. ?Symptoms affect the arm or leg and can include swelling, pain, tenderness, warmth, redness, or discoloration. ?This condition may be treated with medicines. In severe cases, a procedure or surgery may be done to remove or dissolve the clots. ?If you are taking blood thinners, take them exactly as told. Do not skip a dose. Do not take more than is prescribed. ?Get help right away if you have a severe headache, shortness of breath, chest pain, fast or irregular heartbeats, or blood in your vomit, urine, or stool. ?This information is not intended to replace advice given to you by your health care provider. Make sure you discuss any questions you have with your health care provider. ?Document Revised: 08/20/2020 Document Reviewed: 08/20/2020 ?[employer] Patient Education ? [employer]. ? ?

## 2021-04-19 NOTE — ED Notes (Signed)
Pt endorses she will be able to call a ride upon discharge since she is receiving a narcotic for pain ?

## 2021-04-19 NOTE — ED Notes (Signed)
Pt states she does not have a ride to pick her up and that she was fine. Explained unable to allow her to drive due to narcotic administered at 536 pm. Notified PA and instructed she would need to stay until re evaluated ?

## 2021-04-22 ENCOUNTER — Other Ambulatory Visit: Payer: Self-pay

## 2021-04-22 ENCOUNTER — Ambulatory Visit (INDEPENDENT_AMBULATORY_CARE_PROVIDER_SITE_OTHER): Payer: Self-pay | Admitting: Medical

## 2021-04-22 ENCOUNTER — Inpatient Hospital Stay (HOSPITAL_BASED_OUTPATIENT_CLINIC_OR_DEPARTMENT_OTHER)
Admission: EM | Admit: 2021-04-22 | Discharge: 2021-05-03 | DRG: 853 | Disposition: A | Discharge status: Home / Self Care | Payer: Self-pay | Attending: Internal Medicine | Admitting: Internal Medicine

## 2021-04-22 ENCOUNTER — Emergency Department (HOSPITAL_BASED_OUTPATIENT_CLINIC_OR_DEPARTMENT_OTHER): Payer: Self-pay

## 2021-04-22 ENCOUNTER — Encounter (HOSPITAL_BASED_OUTPATIENT_CLINIC_OR_DEPARTMENT_OTHER): Payer: Self-pay | Admitting: Emergency Medicine

## 2021-04-22 VITALS — BP 96/69 | HR 62 | Temp 97.8°F | Resp 18 | Ht 72.0 in | Wt 168.0 lb

## 2021-04-22 DIAGNOSIS — I2510 Atherosclerotic heart disease of native coronary artery without angina pectoris: Secondary | ICD-10-CM | POA: Diagnosis present

## 2021-04-22 DIAGNOSIS — E1142 Type 2 diabetes mellitus with diabetic polyneuropathy: Secondary | ICD-10-CM | POA: Diagnosis present

## 2021-04-22 DIAGNOSIS — I5032 Chronic diastolic (congestive) heart failure: Secondary | ICD-10-CM | POA: Diagnosis present

## 2021-04-22 DIAGNOSIS — K5900 Constipation, unspecified: Secondary | ICD-10-CM | POA: Diagnosis not present

## 2021-04-22 DIAGNOSIS — L03116 Cellulitis of left lower limb: Secondary | ICD-10-CM | POA: Diagnosis present

## 2021-04-22 DIAGNOSIS — I429 Cardiomyopathy, unspecified: Secondary | ICD-10-CM | POA: Diagnosis present

## 2021-04-22 DIAGNOSIS — N179 Acute kidney failure, unspecified: Secondary | ICD-10-CM

## 2021-04-22 DIAGNOSIS — N189 Chronic kidney disease, unspecified: Secondary | ICD-10-CM

## 2021-04-22 DIAGNOSIS — E872 Acidosis, unspecified: Secondary | ICD-10-CM | POA: Diagnosis present

## 2021-04-22 DIAGNOSIS — G43909 Migraine, unspecified, not intractable, without status migrainosus: Secondary | ICD-10-CM | POA: Diagnosis present

## 2021-04-22 DIAGNOSIS — E785 Hyperlipidemia, unspecified: Secondary | ICD-10-CM

## 2021-04-22 DIAGNOSIS — F32A Depression, unspecified: Secondary | ICD-10-CM | POA: Diagnosis present

## 2021-04-22 DIAGNOSIS — Z8541 Personal history of malignant neoplasm of cervix uteri: Secondary | ICD-10-CM

## 2021-04-22 DIAGNOSIS — L02416 Cutaneous abscess of left lower limb: Secondary | ICD-10-CM | POA: Diagnosis present

## 2021-04-22 DIAGNOSIS — Z91040 Latex allergy status: Secondary | ICD-10-CM

## 2021-04-22 DIAGNOSIS — E782 Mixed hyperlipidemia: Secondary | ICD-10-CM | POA: Diagnosis present

## 2021-04-22 DIAGNOSIS — R079 Chest pain, unspecified: Secondary | ICD-10-CM

## 2021-04-22 DIAGNOSIS — Z7982 Long term (current) use of aspirin: Secondary | ICD-10-CM

## 2021-04-22 DIAGNOSIS — E1143 Type 2 diabetes mellitus with diabetic autonomic (poly)neuropathy: Secondary | ICD-10-CM | POA: Diagnosis present

## 2021-04-22 DIAGNOSIS — Z20822 Contact with and (suspected) exposure to covid-19: Secondary | ICD-10-CM | POA: Diagnosis present

## 2021-04-22 DIAGNOSIS — Z7951 Long term (current) use of inhaled steroids: Secondary | ICD-10-CM

## 2021-04-22 DIAGNOSIS — A419 Sepsis, unspecified organism: Secondary | ICD-10-CM

## 2021-04-22 DIAGNOSIS — E1065 Type 1 diabetes mellitus with hyperglycemia: Secondary | ICD-10-CM

## 2021-04-22 DIAGNOSIS — Z8673 Personal history of transient ischemic attack (TIA), and cerebral infarction without residual deficits: Secondary | ICD-10-CM

## 2021-04-22 DIAGNOSIS — Z885 Allergy status to narcotic agent status: Secondary | ICD-10-CM

## 2021-04-22 DIAGNOSIS — Z888 Allergy status to other drugs, medicaments and biological substances status: Secondary | ICD-10-CM

## 2021-04-22 DIAGNOSIS — R6520 Severe sepsis without septic shock: Secondary | ICD-10-CM

## 2021-04-22 DIAGNOSIS — K3184 Gastroparesis: Secondary | ICD-10-CM | POA: Diagnosis present

## 2021-04-22 DIAGNOSIS — N183 Chronic kidney disease, stage 3 unspecified: Secondary | ICD-10-CM | POA: Diagnosis present

## 2021-04-22 DIAGNOSIS — E1165 Type 2 diabetes mellitus with hyperglycemia: Secondary | ICD-10-CM

## 2021-04-22 DIAGNOSIS — G894 Chronic pain syndrome: Secondary | ICD-10-CM | POA: Diagnosis present

## 2021-04-22 DIAGNOSIS — Z87891 Personal history of nicotine dependence: Secondary | ICD-10-CM

## 2021-04-22 DIAGNOSIS — Z791 Long term (current) use of non-steroidal anti-inflammatories (NSAID): Secondary | ICD-10-CM

## 2021-04-22 DIAGNOSIS — Z7984 Long term (current) use of oral hypoglycemic drugs: Secondary | ICD-10-CM

## 2021-04-22 DIAGNOSIS — I130 Hypertensive heart and chronic kidney disease with heart failure and stage 1 through stage 4 chronic kidney disease, or unspecified chronic kidney disease: Secondary | ICD-10-CM | POA: Diagnosis present

## 2021-04-22 DIAGNOSIS — F419 Anxiety disorder, unspecified: Secondary | ICD-10-CM | POA: Diagnosis present

## 2021-04-22 DIAGNOSIS — M726 Necrotizing fasciitis: Secondary | ICD-10-CM | POA: Diagnosis present

## 2021-04-22 DIAGNOSIS — Z794 Long term (current) use of insulin: Secondary | ICD-10-CM

## 2021-04-22 DIAGNOSIS — K219 Gastro-esophageal reflux disease without esophagitis: Secondary | ICD-10-CM | POA: Diagnosis present

## 2021-04-22 DIAGNOSIS — Z79899 Other long term (current) drug therapy: Secondary | ICD-10-CM

## 2021-04-22 DIAGNOSIS — L0390 Cellulitis, unspecified: Secondary | ICD-10-CM

## 2021-04-22 DIAGNOSIS — E11649 Type 2 diabetes mellitus with hypoglycemia without coma: Secondary | ICD-10-CM | POA: Diagnosis not present

## 2021-04-22 DIAGNOSIS — E871 Hypo-osmolality and hyponatremia: Secondary | ICD-10-CM | POA: Diagnosis present

## 2021-04-22 DIAGNOSIS — Z833 Family history of diabetes mellitus: Secondary | ICD-10-CM

## 2021-04-22 HISTORY — DX: Cellulitis of left lower limb: L03.116

## 2021-04-22 HISTORY — DX: Chronic kidney disease, unspecified: N17.9

## 2021-04-22 HISTORY — DX: Sepsis, unspecified organism: A41.9

## 2021-04-22 HISTORY — DX: Chest pain, unspecified: R07.9

## 2021-04-22 HISTORY — DX: Type 2 diabetes mellitus with hyperglycemia: E11.65

## 2021-04-22 HISTORY — DX: Severe sepsis without septic shock: R65.20

## 2021-04-22 LAB — TROPONIN I (HIGH SENSITIVITY)
Troponin I (High Sensitivity): 5 ng/L (ref <18)
Troponin I (High Sensitivity): 5 ng/L (ref <18)
Troponin I (High Sensitivity): 6 ng/L (ref <18)

## 2021-04-22 LAB — HEPATIC FUNCTION PANEL
ALT: 35 U/L (ref 0–44)
AST: 31 U/L (ref 15–41)
Albumin: 3.3 g/dL — ABNORMAL LOW (ref 3.5–5.0)
Alkaline Phosphatase: 87 U/L (ref 38–126)
Bilirubin, Direct: 0.1 mg/dL (ref 0.0–0.2)
Indirect Bilirubin: 0.6 mg/dL (ref 0.3–0.9)
Total Bilirubin: 0.7 mg/dL (ref 0.3–1.2)
Total Protein: 8.3 g/dL — ABNORMAL HIGH (ref 6.5–8.1)

## 2021-04-22 LAB — CBC
HCT: 39.2 % (ref 36.0–46.0)
Hemoglobin: 12.9 g/dL (ref 12.0–15.0)
MCH: 29.6 pg (ref 26.0–34.0)
MCHC: 32.9 g/dL (ref 30.0–36.0)
MCV: 89.9 fL (ref 80.0–100.0)
Platelets: 260 10*3/uL (ref 150–400)
RBC: 4.36 MIL/uL (ref 3.87–5.11)
RDW: 15.2 % (ref 11.5–15.5)
WBC: 9.9 10*3/uL (ref 4.0–10.5)
nRBC: 0 % (ref 0.0–0.2)

## 2021-04-22 LAB — BASIC METABOLIC PANEL
Anion gap: 9 (ref 5–15)
BUN: 23 mg/dL — ABNORMAL HIGH (ref 6–20)
CO2: 21 mmol/L — ABNORMAL LOW (ref 22–32)
Calcium: 8.8 mg/dL — ABNORMAL LOW (ref 8.9–10.3)
Chloride: 101 mmol/L (ref 98–111)
Creatinine, Ser: 1.59 mg/dL — ABNORMAL HIGH (ref 0.44–1.00)
GFR, Estimated: 38 mL/min — ABNORMAL LOW (ref >60)
Glucose, Bld: 310 mg/dL — ABNORMAL HIGH (ref 70–99)
Potassium: 3.8 mmol/L (ref 3.5–5.1)
Sodium: 131 mmol/L — ABNORMAL LOW (ref 135–145)

## 2021-04-22 LAB — PROTIME-INR
INR: 1 (ref 0.8–1.2)
Prothrombin Time: 12.8 seconds (ref 11.4–15.2)

## 2021-04-22 LAB — APTT: aPTT: 29 seconds (ref 24–36)

## 2021-04-22 LAB — LACTIC ACID, PLASMA
Lactic Acid, Venous: 2.3 mmol/L (ref 0.5–1.9)
Lactic Acid, Venous: 3.4 mmol/L (ref 0.5–1.9)

## 2021-04-22 LAB — GLUCOSE, CAPILLARY: Glucose-Capillary: 271 mg/dL — ABNORMAL HIGH (ref 70–99)

## 2021-04-22 MED ORDER — ALBUTEROL SULFATE (2.5 MG/3ML) 0.083% IN NEBU: 2.5000 mg | INHALATION_SOLUTION | Freq: Four times a day (QID) | RESPIRATORY_TRACT | Status: DC | PRN

## 2021-04-22 MED ORDER — MELOXICAM 7.5 MG PO TABS
7.5000 mg | ORAL_TABLET | Freq: Every day | ORAL | Status: DC
  Administered 2021-04-23 – 2021-04-25 (×2): 7.5 mg via ORAL
  Filled 2021-04-22 (×4): qty 1

## 2021-04-22 MED ORDER — AZELASTINE HCL 0.1 % NA SOLN: 2.0000 | Freq: Two times a day (BID) | NASAL | Status: DC

## 2021-04-22 MED ORDER — SUMATRIPTAN SUCCINATE 100 MG PO TABS
100.0000 mg | ORAL_TABLET | Freq: Every day | ORAL | Status: DC | PRN
  Administered 2021-04-22: 100 mg via ORAL
  Filled 2021-04-22 (×2): qty 1

## 2021-04-22 MED ORDER — NITROGLYCERIN 0.4 MG SL SUBL: 0.4000 mg | SUBLINGUAL_TABLET | SUBLINGUAL | Status: DC | PRN

## 2021-04-22 MED ORDER — METRONIDAZOLE 500 MG PO TABS
500.0000 mg | ORAL_TABLET | Freq: Two times a day (BID) | ORAL | Status: DC
  Administered 2021-04-22: 500 mg via ORAL
  Filled 2021-04-22: qty 1

## 2021-04-22 MED ORDER — METHOCARBAMOL 750 MG PO TABS
750.0000 mg | ORAL_TABLET | Freq: Three times a day (TID) | ORAL | Status: DC | PRN
  Administered 2021-04-22 – 2021-04-23 (×2): 750 mg via ORAL
  Filled 2021-04-22 (×2): qty 1

## 2021-04-22 MED ORDER — ASPIRIN EC 81 MG PO TBEC: 81.0000 mg | DELAYED_RELEASE_TABLET | Freq: Every day | ORAL | Status: DC

## 2021-04-22 MED ORDER — LACTATED RINGERS IV BOLUS
1350.0000 mL | Freq: Once | INTRAVENOUS | Status: AC
Start: 2021-04-22 — End: 2021-04-22
  Administered 2021-04-22: 1350 mL via INTRAVENOUS

## 2021-04-22 MED ORDER — INSULIN DEGLUDEC 100 UNIT/ML ~~LOC~~ SOPN: 40.0000 [IU] | PEN_INJECTOR | Freq: Every evening | SUBCUTANEOUS | Status: DC

## 2021-04-22 MED ORDER — FENTANYL CITRATE PF 50 MCG/ML IJ SOSY: 25.0000 ug | PREFILLED_SYRINGE | INTRAMUSCULAR | Status: DC | PRN

## 2021-04-22 MED ORDER — SERTRALINE HCL 100 MG PO TABS
100.0000 mg | ORAL_TABLET | Freq: Every day | ORAL | Status: DC
  Administered 2021-04-22 – 2021-05-03 (×12): 100 mg via ORAL
  Filled 2021-04-22 (×11): qty 1

## 2021-04-22 MED ORDER — PROMETHAZINE HCL 25 MG PO TABS
12.5000 mg | ORAL_TABLET | Freq: Three times a day (TID) | ORAL | Status: DC | PRN
  Administered 2021-04-28: 12.5 mg via ORAL
  Filled 2021-04-22 (×2): qty 1

## 2021-04-22 MED ORDER — FENTANYL CITRATE PF 50 MCG/ML IJ SOSY
50.0000 ug | PREFILLED_SYRINGE | Freq: Once | INTRAMUSCULAR | Status: AC
  Administered 2021-04-22: 50 ug via INTRAVENOUS
  Filled 2021-04-22: qty 1

## 2021-04-22 MED ORDER — ONDANSETRON HCL 4 MG/2ML IJ SOLN
4.0000 mg | Freq: Four times a day (QID) | INTRAMUSCULAR | Status: DC | PRN
  Administered 2021-04-23: 4 mg via INTRAVENOUS
  Filled 2021-04-22: qty 2

## 2021-04-22 MED ORDER — ASPIRIN EC 81 MG PO TBEC
81.0000 mg | DELAYED_RELEASE_TABLET | Freq: Every day | ORAL | Status: DC
  Administered 2021-04-23 – 2021-05-03 (×10): 81 mg via ORAL
  Filled 2021-04-22 (×10): qty 1

## 2021-04-22 MED ORDER — ALBUTEROL SULFATE HFA 108 (90 BASE) MCG/ACT IN AERS: 2.0000 | INHALATION_SPRAY | Freq: Four times a day (QID) | RESPIRATORY_TRACT | Status: DC | PRN

## 2021-04-22 MED ORDER — VANCOMYCIN HCL IN DEXTROSE 1-5 GM/200ML-% IV SOLN: 1000.0000 mg | Freq: Once | INTRAVENOUS | Status: DC

## 2021-04-22 MED ORDER — AMLODIPINE BESYLATE 10 MG PO TABS
10.0000 mg | ORAL_TABLET | Freq: Every day | ORAL | Status: DC
  Administered 2021-04-23 – 2021-05-03 (×11): 10 mg via ORAL
  Filled 2021-04-22 (×11): qty 1

## 2021-04-22 MED ORDER — SPIRONOLACTONE 25 MG PO TABS
25.0000 mg | ORAL_TABLET | Freq: Every day | ORAL | Status: DC
Start: 2021-04-23 — End: 2021-04-22

## 2021-04-22 MED ORDER — TRAZODONE HCL 50 MG PO TABS
25.0000 mg | ORAL_TABLET | Freq: Every evening | ORAL | Status: DC | PRN
  Administered 2021-04-22 – 2021-05-01 (×4): 25 mg via ORAL
  Filled 2021-04-22 (×5): qty 1

## 2021-04-22 MED ORDER — ACETAMINOPHEN 325 MG PO TABS: 650.0000 mg | ORAL_TABLET | Freq: Four times a day (QID) | ORAL | Status: DC | PRN

## 2021-04-22 MED ORDER — SODIUM CHLORIDE 0.9 % IV SOLN
2.0000 g | Freq: Once | INTRAVENOUS | Status: AC
  Administered 2021-04-22: 2 g via INTRAVENOUS
  Filled 2021-04-22: qty 2

## 2021-04-22 MED ORDER — FLUTICASONE PROPIONATE 50 MCG/ACT NA SUSP
2.0000 | Freq: Every day | NASAL | Status: DC
  Administered 2021-04-23 – 2021-05-03 (×10): 2 via NASAL
  Filled 2021-04-22: qty 16

## 2021-04-22 MED ORDER — BUSPIRONE HCL 5 MG PO TABS: 15.0000 mg | ORAL_TABLET | Freq: Two times a day (BID) | ORAL | Status: DC | PRN

## 2021-04-22 MED ORDER — RIZATRIPTAN BENZOATE 10 MG PO TBDP: 10.0000 mg | ORAL_TABLET | Freq: Every day | ORAL | Status: DC | PRN

## 2021-04-22 MED ORDER — INSULIN GLARGINE-YFGN 100 UNIT/ML ~~LOC~~ SOLN
40.0000 [IU] | Freq: Every day | SUBCUTANEOUS | Status: DC
  Administered 2021-04-22 – 2021-04-23 (×2): 40 [IU] via SUBCUTANEOUS
  Filled 2021-04-22 (×3): qty 0.4

## 2021-04-22 MED ORDER — GABAPENTIN 300 MG PO CAPS
600.0000 mg | ORAL_CAPSULE | Freq: Three times a day (TID) | ORAL | Status: DC
  Administered 2021-04-22 – 2021-05-03 (×31): 600 mg via ORAL
  Filled 2021-04-22 (×33): qty 2

## 2021-04-22 MED ORDER — ONDANSETRON HCL 4 MG PO TABS: 4.0000 mg | ORAL_TABLET | Freq: Four times a day (QID) | ORAL | Status: DC | PRN

## 2021-04-22 MED ORDER — FLUTICASONE FUROATE-VILANTEROL 100-25 MCG/INH IN AEPB: 1.0000 | INHALATION_SPRAY | Freq: Every day | RESPIRATORY_TRACT | Status: DC

## 2021-04-22 MED ORDER — ATORVASTATIN CALCIUM 80 MG PO TABS
80.0000 mg | ORAL_TABLET | Freq: Every day | ORAL | Status: DC
Start: 2021-04-23 — End: 2021-05-03
  Administered 2021-04-23 – 2021-05-03 (×10): 80 mg via ORAL
  Filled 2021-04-22 (×10): qty 1

## 2021-04-22 MED ORDER — ENOXAPARIN SODIUM 40 MG/0.4ML IJ SOSY
40.0000 mg | PREFILLED_SYRINGE | INTRAMUSCULAR | Status: DC
  Administered 2021-04-22 – 2021-05-02 (×11): 40 mg via SUBCUTANEOUS
  Filled 2021-04-22 (×11): qty 0.4

## 2021-04-22 MED ORDER — VANCOMYCIN HCL 1250 MG/250ML IV SOLN
1250.0000 mg | INTRAVENOUS | Status: DC
  Administered 2021-04-23: 1250 mg via INTRAVENOUS
  Filled 2021-04-22: qty 250

## 2021-04-22 MED ORDER — MAGNESIUM HYDROXIDE 400 MG/5ML PO SUSP
30.0000 mL | Freq: Every day | ORAL | Status: DC | PRN
  Filled 2021-04-22: qty 30

## 2021-04-22 MED ORDER — FLUTICASONE FUROATE-VILANTEROL 100-25 MCG/ACT IN AEPB
1.0000 | INHALATION_SPRAY | Freq: Every day | RESPIRATORY_TRACT | Status: DC
  Administered 2021-04-23 – 2021-05-03 (×11): 1 via RESPIRATORY_TRACT
  Filled 2021-04-22: qty 28

## 2021-04-22 MED ORDER — ACETAMINOPHEN 650 MG RE SUPP: 650.0000 mg | Freq: Four times a day (QID) | RECTAL | Status: DC | PRN

## 2021-04-22 MED ORDER — CARVEDILOL 25 MG PO TABS
25.0000 mg | ORAL_TABLET | Freq: Two times a day (BID) | ORAL | Status: DC
Start: 2021-04-23 — End: 2021-05-03
  Administered 2021-04-23 – 2021-05-03 (×17): 25 mg via ORAL
  Filled 2021-04-22 (×19): qty 1

## 2021-04-22 MED ORDER — METRONIDAZOLE 500 MG/100ML IV SOLN: 500.0000 mg | Freq: Two times a day (BID) | INTRAVENOUS | Status: DC

## 2021-04-22 MED ORDER — SODIUM CHLORIDE 0.9 % IV SOLN
2.0000 g | Freq: Two times a day (BID) | INTRAVENOUS | Status: DC
  Administered 2021-04-23 – 2021-04-28 (×11): 2 g via INTRAVENOUS
  Filled 2021-04-22 (×11): qty 2

## 2021-04-22 MED ORDER — TOPIRAMATE 25 MG PO TABS
50.0000 mg | ORAL_TABLET | Freq: Two times a day (BID) | ORAL | Status: DC
  Administered 2021-04-22 – 2021-05-03 (×21): 50 mg via ORAL
  Filled 2021-04-22 (×21): qty 2

## 2021-04-22 MED ORDER — OXYCODONE HCL 5 MG PO TABS
5.0000 mg | ORAL_TABLET | Freq: Four times a day (QID) | ORAL | Status: DC | PRN
  Administered 2021-04-22 – 2021-04-23 (×2): 5 mg via ORAL
  Filled 2021-04-22 (×2): qty 1

## 2021-04-22 MED ORDER — VANCOMYCIN HCL IN DEXTROSE 1-5 GM/200ML-% IV SOLN
1000.0000 mg | Freq: Once | INTRAVENOUS | Status: AC
  Administered 2021-04-22: 1000 mg via INTRAVENOUS
  Filled 2021-04-22: qty 200

## 2021-04-22 MED ORDER — LACTATED RINGERS IV BOLUS
1000.0000 mL | Freq: Once | INTRAVENOUS | Status: AC
  Administered 2021-04-22: 1000 mL via INTRAVENOUS

## 2021-04-22 MED ORDER — CHOLECALCIFEROL 10 MCG (400 UNIT) PO TABS
400.0000 [IU] | ORAL_TABLET | Freq: Every day | ORAL | Status: DC
  Administered 2021-04-23 – 2021-05-03 (×10): 400 [IU] via ORAL
  Filled 2021-04-22 (×11): qty 1

## 2021-04-22 MED ORDER — FAMOTIDINE 20 MG PO TABS
20.0000 mg | ORAL_TABLET | Freq: Every day | ORAL | Status: DC
  Administered 2021-04-23 – 2021-05-03 (×10): 20 mg via ORAL
  Filled 2021-04-22 (×10): qty 1

## 2021-04-22 NOTE — Assessment & Plan Note (Signed)
-   The patient will be hydrated with IV normal saline. - We will follow BMP. - We will avoid nephrotoxins. 

## 2021-04-22 NOTE — Assessment & Plan Note (Signed)
-   This is manifested by tachypnea of 22 and heart rate of 96 in the setting of cellulitis. ?- The patient will be hydrated with IV normal saline. ?- We will follow blood cultures. ?- Patient will be on IV vancomycin and cefepime as mentioned above. ?

## 2021-04-22 NOTE — H&P (Incomplete)
[place]   PATIENT NAME: [name]    MR#:  [id number]  DATE OF BIRTH:  10-06-1965  DATE OF ADMISSION:  04/22/2021  PRIMARY CARE PHYSICIAN: Mackie Pai, PA-C   Patient is coming from: Home  REQUESTING/REFERRING PHYSICIAN: [facility]: Sherrell Puller, [place]   CHIEF COMPLAINT:   Chief Complaint  Patient presents with   Chest Pain   Cellulitis    HISTORY OF PRESENT ILLNESS:  [name] is a 56 y.o. African-American female with medical history significant for coronary artery disease, cardiomyopathy, CVA, type 2 diabetes mellitus with peripheral neuropathy, depression, GERD, and dyslipidemia, who presented to the [facility] ED with acute onset of left lower extremity swelling with associated erythema and pain for which she has been treated for cellulitis with p.o. doxycycline and has still been worsening since Saturday.  No fever or chills.  No nausea or vomiting or abdominal pain.  She has been complaining of mid sternal chest pain.  ED Course: When she came to the ED vital signs were within normal and later on respiratory rate was 22 and here heart rate was 96.  BP has been as low as 84/58 and responded to IV fluids.  Labs revealed mild hyponatremia 131 and her CO2 was 21 with glucose of 310, BUN of 23 and creatinine 1.59 comparable to 3 days ago but above previous levels.  Albumin was 3.3 with total protein of 8.3.  High 60 troponin I was 5 twice.  Lactic acid was 2.3 and later 3.4.  CBC was within normal.  Blood cultures were drawn. EKG as reviewed by me :  EKG showed normal sinus rhythm with rate 78. Imaging: 2 view chest x-ray showed no acute cardiopulmonary disease.  The patient was given 50 mcg of IV fentanyl, 2.35 L bolus of lactated Ringer as well as 1 g of IV vancomycin.  She is directly admitted to a medical bed for further evaluation and management. PAST MEDICAL HISTORY:   Past Medical History:  Diagnosis Date   ABDOMINAL PAIN-EPIGASTRIC 09/14/2009    Qualifier: Diagnosis of  By: Shane Crutch, [name]    Acute cystitis 07/07/2017   ANKLE PAIN, LEFT 12/25/2009   Qualifier: Diagnosis of  By: Amil Amen MD, [name]     Anxiety 01/22/2016   CAD (coronary artery disease) 07/07/2017   Cardiac murmur 02/01/2019   Cardiomyopathy, secondary ([place]) 09/06/2009   Qualifier: Diagnosis of  By: Arvid Right   Formatting of this note might be different from the original. Overview:  Qualifier: Diagnosis of  By: Arvid Right   Cardiomyopathy, unspecified ([place]) 09/06/2009   Formatting of this note might be different from the original. Overview:  Overview:  Qualifier: Diagnosis of  By: Arvid Right  Overview:  Overview:  Qualifier: Diagnosis of  By: Arvid Right Formatting of this note might be different from the original. Overview:  Qualifier: Diagnosis of  By: Arvid Right   Cerebrovascular disease 09/13/2012   Cervical cancer ([place])    had surgery in 2001.   CHEST PAIN, ATYPICAL 07/30/2009   Qualifier: Diagnosis of  By: Amil Amen MD, Winona Legato of this note might be different from the original. Overview:  Qualifier: Diagnosis of  By: Amil Amen MD, [name]   Chronic back pain    Chronic kidney disease, stage III (moderate) ([place]) 05/23/2014   Chronic pain syndrome 01/23/2016   Coronary artery disease    30% lesions noted  Current use of insulin ([place]) 05/23/2014   Depression    Diabetes mellitus    DIABETES MELLITUS, TYPE II, UNCONTROLLED 07/30/2009   Qualifier: Diagnosis of  By: Amil Amen MD, [name]     Diabetes type 2, uncontrolled 05/23/2014   Diabetic gastroparesis associated with type 1 diabetes mellitus ([place]) 11/27/2016   DIABETIC PERIPHERAL NEUROPATHY 09/21/2009   Qualifier: Diagnosis of  By: Amil Amen MD, [name]     Diabetic peripheral neuropathy ([place]) 01/23/2016   Dysuria 12/25/2009   Qualifier: Diagnosis of  By: Amil Amen MD, [name]     Elevation of  level of transaminase or lactic acid dehydrogenase (LDH) 09/21/2009   Formatting of this note might be different from the original. Overview:  Qualifier: Diagnosis of  By: Amil Amen MD, [place]   Essential hypertension 08/30/2009   Qualifier: Diagnosis of  By: Lovette Cliche, CNA, Christy     Gastric atony 07/30/2009   Formatting of this note might be different from the original. Overview:  Qualifier: Diagnosis of  By: Amil Amen MD, [place]   Gastroesophageal reflux disease 07/30/2009   Formatting of this note might be different from the original. Overview:  Overview:  Qualifier: Diagnosis of  By: Amil Amen MD, Elby Showers: Diagnosis of  By: Chester Holstein NP, Ellwood Handler of this note might be different from the original. Overview:  Qualifier: Diagnosis of  By: Amil Amen MD, Elby Showers: Diagnosis of  By: Chester Holstein NP, Nevin Bloodgood   Gastroparesis 07/30/2009   Qualifier: Diagnosis of  By: Amil Amen MD, [place]     GERD 07/30/2009   Qualifier: Diagnosis of  By: Amil Amen MD, Elby Showers: Diagnosis of  By: Chester Holstein NP, Nevin Bloodgood     GERD (gastroesophageal reflux disease)    History of cervical cancer 08/17/2015   Status post partial hysterectomy  Formatting of this note might be different from the original. Status post partial hysterectomy   HLD (hyperlipidemia) 07/30/2009   Qualifier: Diagnosis of  By: Amil Amen MD, [name]     Hyperlipidemia    Hyperlipidemia, unspecified 07/30/2009   Formatting of this note might be different from the original. Overview:  Qualifier: Diagnosis of  By: Amil Amen MD, Winona Legato of this note might be different from the original. Overview:  Qualifier: Diagnosis of  By: Amil Amen MD, [name]   Hypertension    INSOMNIA 09/21/2009   Qualifier: Diagnosis of  By: Amil Amen MD, [name]     Lumbar facet arthropathy 05/14/2016   Lumbar spinal stenosis 02/11/2018   Metatarsalgia of both feet 03/11/2017   Migraine 09/13/2012   Overview:  IMPRESSION: possible abd  migraine   Mixed dyslipidemia 02/01/2019   Nausea & vomiting 07/07/2017   Nausea with vomiting, unspecified 09/14/2009   Qualifier: Diagnosis of  By: Shane Crutch, [name]   Formatting of this note might be different from the original. Overview:  Qualifier: Diagnosis of  By: Shane Crutch, [name]   Neuropathy    Nonspecific abnormal findings on radiological and examination of skull and head 09/13/2012   Overweight (BMI 25.0-29.9) 07/21/2014   POSITIVE PPD 07/30/2009   Annotation: CXR clear ?  diagnosed in 2/11?  On INH/pyridoxine Qualifier: Diagnosis of  By: Amil Amen MD, [name]     Pure hypercholesterolemia 11/27/2016   Retention of urine 06/25/2011   Right flank pain    TOBACCO ABUSE 09/21/2009   Qualifier: Diagnosis of  By: Amil Amen MD, Winona Legato of this note might be different from the original. Overview:  Qualifier: Diagnosis of  By: Amil Amen MD,  [name]   TRANSAMINASES, SERUM, ELEVATED 09/21/2009   Qualifier: Diagnosis of  By: Amil Amen MD, [name]     Trigeminal neuralgia    Tuberculin test reaction 07/30/2009   Formatting of this note might be different from the original. Overview:  Annotation: CXR clear ?  diagnosed in 2/11?  On INH/pyridoxine Qualifier: Diagnosis of  By: Amil Amen MD, [name]   Type 2 diabetes mellitus with hyperglycemia ([place]) 07/30/2009   Qualifier: Diagnosis of  By: Amil Amen MD, Winona Legato of this note might be different from the original. Overview:  Qualifier: Diagnosis of  By: Amil Amen MD, [name]   Type 2 diabetes mellitus with hyperglycemia, with long-term current use of insulin ([place]) 07/30/2009   Formatting of this note might be different from the original. Overview:  05/30/2015 A1C was 16.7% (!)  Overview:  05/30/2015 A1C was 16.7% (!) Formatting of this note might be different from the original. 05/30/2015 A1C was 16.7% (!)   Type 2 diabetes mellitus with stage 3 chronic kidney disease ([place]) 09/14/2009   Qualifier: Diagnosis of  By:  Shane Crutch, [name]    ULCER-GASTRIC 09/28/2009   Qualifier: Diagnosis of  By: Chester Holstein NP, [place]     URINARY TRACT INFECTION 09/21/2009   Qualifier: Diagnosis of  By: Amil Amen MD, Louanne Belton, CANDIDAL 12/25/2009   Qualifier: Diagnosis of  By: Amil Amen MD, Tessa Lerner D deficiency 04/25/2013    PAST SURGICAL HISTORY:   Past Surgical History:  Procedure Laterality Date   ABDOMINAL HYSTERECTOMY     partial   CHOLECYSTECTOMY      SOCIAL HISTORY:   Social History   Tobacco Use   Smoking status: Never   Smokeless tobacco: Never  Substance Use Topics   Alcohol use: Yes    Comment: occ    FAMILY HISTORY:   Family History  Problem Relation Age of Onset   Diabetes Mother    Diabetes Father     DRUG ALLERGIES:   Allergies  Allergen Reactions   Insulin Aspart Other (See Comments) and Swelling    adverse reaction to Novolog    Latex Itching and Rash   Morphine Itching and Rash    REVIEW OF SYSTEMS:   ROS As per history of present illness. All pertinent systems were reviewed above. Constitutional, HEENT, cardiovascular, respiratory, GI, GU, musculoskeletal, neuro, psychiatric, endocrine, integumentary and hematologic systems were reviewed and are otherwise negative/unremarkable except for positive findings mentioned above in the HPI.   MEDICATIONS AT HOME:   Prior to Admission medications   Medication Sig Start Date End Date Taking? Authorizing Provider  albuterol (VENTOLIN HFA) 108 (90 Base) MCG/ACT inhaler Inhale 2 puffs into the lungs every 6 (six) hours as needed for wheezing or shortness of breath. 08/03/20  Yes Saguier, Percell Miller, PA-C  amLODipine (NORVASC) 10 MG tablet Take 1 tablet by mouth once daily Patient taking differently: Take 10 mg by mouth daily. 02/05/21  Yes Saguier, Percell Miller, PA-C  aspirin 81 MG tablet Take 81 mg by mouth daily.   Yes [provider]  atorvastatin (LIPITOR) 80 MG tablet Take 1 tablet by mouth once  daily Patient taking differently: Take 80 mg by mouth daily. 08/06/20  Yes Saguier, Percell Miller, PA-C  azelastine (ASTELIN) 0.1 % nasal spray Place 2 sprays into both nostrils 2 (two) times daily. 04/09/20  Yes Paz, Alda Berthold, MD  busPIRone (BUSPAR) 15 MG tablet Take 1 tablet (15 mg total) by mouth 2 (two) times daily. 10/30/20  Yes Saguier, Percell Miller, PA-C  carvedilol (COREG) 25 MG tablet Take 1 tablet (25 mg total) by mouth 2 (two) times daily with a meal. 12/17/20  Yes Saguier, Percell Miller, PA-C  Cholecalciferol (VITAMIN D3) 10 MCG (400 UNIT) CAPS Take 400 mg by mouth daily. 12/17/20  Yes Saguier, Percell Miller, PA-C  doxycycline (VIBRAMYCIN) 100 MG capsule Take 1 capsule (100 mg total) by mouth 2 (two) times daily. 04/19/21  Yes Henderly, Britni A, PA-C  famotidine (PEPCID) 20 MG tablet Take 1 tablet (20 mg total) by mouth daily. 04/03/21  Yes Saguier, Percell Miller, PA-C  fluticasone (FLONASE) 50 MCG/ACT nasal spray Place 2 sprays into both nostrils daily. 09/27/19  Yes Saguier, Percell Miller, PA-C  fluticasone furoate-vilanterol (BREO ELLIPTA) 100-25 MCG/INH AEPB Inhale 1 puff into the lungs daily. 08/03/20  Yes Saguier, Percell Miller, PA-C  gabapentin (NEURONTIN) 300 MG capsule Take 2 capsules (600 mg total) by mouth 3 (three) times daily. 12/17/20  Yes Saguier, Percell Miller, PA-C  insulin degludec (TRESIBA) 100 UNIT/ML FlexTouch Pen Inject 40 Units into the skin daily at 10 pm. Patient taking differently: Inject 40 Units into the skin every evening. 11/16/20  Yes Saguier, Percell Miller, PA-C  meloxicam (MOBIC) 7.5 MG tablet Take 1-2 tablets (7.5-15 mg total) by mouth daily. 03/21/21  Yes Saguier, Percell Miller, PA-C  metFORMIN (GLUCOPHAGE-XR) 500 MG 24 hr tablet Take 1 tablet (500 mg total) by mouth daily with breakfast. 01/15/21  Yes Saguier, Percell Miller, PA-C  methocarbamol (ROBAXIN) 750 MG tablet Take 1 tablet (750 mg total) by mouth every 8 (eight) hours as needed for muscle spasms. 04/01/21  Yes Saguier, Percell Miller, PA-C  nitroGLYCERIN (NITROSTAT) 0.4 MG SL tablet Place 1  tablet (0.4 mg total) under the tongue every 5 (five) minutes as needed for chest pain. 03/21/21  Yes Saguier, Percell Miller, PA-C  oxyCODONE (ROXICODONE) 5 MG immediate release tablet Take 1 tablet (5 mg total) by mouth every 6 (six) hours as needed for severe pain. 03/21/21  Yes Saguier, Percell Miller, PA-C  promethazine (PHENERGAN) 12.5 MG tablet Take 1 tablet (12.5 mg total) by mouth every 8 (eight) hours as needed. 12/17/20  Yes Saguier, Percell Miller, PA-C  rizatriptan (MAXALT-MLT) 10 MG disintegrating tablet Take 10 mg by mouth daily as needed for migraine. 04/17/20  Yes [provider]  sertraline (ZOLOFT) 100 MG tablet Take 1 tablet (100 mg total) by mouth daily. 08/03/20  Yes Saguier, Percell Miller, PA-C  spironolactone (ALDACTONE) 25 MG tablet Take 1 tablet by mouth once daily Patient taking differently: Take 25 mg by mouth daily. 07/21/19  Yes Saguier, Percell Miller, PA-C  topiramate (TOPAMAX) 50 MG tablet Take 1 tablet (50 mg total) by mouth 2 (two) times daily. 04/17/20  Yes Penumalli, Earlean Polka, MD  FARXIGA 10 MG TABS tablet Take 1 tablet by mouth once daily Patient not taking: Reported on 04/22/2021 07/21/19   Saguier, Percell Miller, PA-C  promethazine (PHENERGAN) 12.5 MG tablet Take 1 tablet (12.5 mg total) by mouth every 8 (eight) hours as needed for nausea or vomiting. Patient not taking: Reported on 04/22/2021 04/03/21   Saguier, Percell Miller, PA-C      VITAL SIGNS:  Blood pressure (!) 102/51, pulse 96, temperature 99.4 F (37.4 C), resp. rate 18, height 6' (1.829 m), weight 76.7 kg, SpO2 99 %.  PHYSICAL EXAMINATION:  Physical Exam  GENERAL:  57 y.o.-year-old patient lying in the bed with no acute distress.  EYES: Pupils equal, round, reactive to light and accommodation. No scleral icterus. Extraocular muscles intact.  HEENT: Head atraumatic, normocephalic. Oropharynx and nasopharynx clear.  NECK:  Supple, no jugular venous distention. No thyroid enlargement, no tenderness.  LUNGS: Normal breath sounds bilaterally, no  wheezing, rales,rhonchi or crepitation. No use of accessory muscles of respiration.  CARDIOVASCULAR: Regular rate and rhythm, S1, S2 normal. No murmurs, rubs, or gallops.  ABDOMEN: Soft, nondistended, nontender. Bowel sounds present. No organomegaly or mass.  EXTREMITIES: No pedal edema, cyanosis, or clubbing.  NEUROLOGIC: Cranial nerves II through XII are intact. Muscle strength 5/5 in all extremities. Sensation intact. Gait not checked.  PSYCHIATRIC: The patient is alert and oriented x 3.  Normal affect and good eye contact. SKIN: Left leg erythema with associated induration and tenderness with associated anterolateral wound.        LABORATORY PANEL:   CBC Recent Labs  Lab 04/22/21 1244  WBC 9.9  HGB 12.9  HCT 39.2  PLT 260   ------------------------------------------------------------------------------------------------------------------  Chemistries  Recent Labs  Lab 04/22/21 1244  NA 131*  K 3.8  CL 101  CO2 21*  GLUCOSE 310*  BUN 23*  CREATININE 1.59*  CALCIUM 8.8*  AST 31  ALT 35  ALKPHOS 87  BILITOT 0.7   ------------------------------------------------------------------------------------------------------------------  Cardiac Enzymes No results for input(s): TROPONINI in the last 168 hours. ------------------------------------------------------------------------------------------------------------------  RADIOLOGY:  DG Chest 2 View  Result Date: 04/22/2021 CLINICAL DATA:  Chest pain EXAM: CHEST - 2 VIEW COMPARISON:  04/16/2020 FINDINGS: The heart size and mediastinal contours are within normal limits. Both lungs are clear. The visualized skeletal structures are unremarkable. IMPRESSION: No acute abnormality of the lungs. Electronically Signed   By: Delanna Ahmadi M.D.   On: 04/22/2021 12:37      IMPRESSION AND PLAN:  Assessment and Plan: * Cellulitis of left leg - The patient will be admitted to a medical monitored bed. - We will place her on IV  cefepime and vancomycin. --Patient management will be provided. - Warm compresses will be up applied.  Sepsis due to cellulitis [facility]) - This is manifested by tachypnea of 22 and heart rate of 96 in the setting of cellulitis. - The patient will be hydrated with IV normal saline. - We will follow blood cultures. - Patient will be on IV vancomycin and cefepime as mentioned above.  Severe sepsis ([place]) - This is manifested by elevated lactic acid and hypotension. - Management as above.  Chest pain, unspecified - We will follow serial troponins. - Patient be placed on aspirin as well as as needed sublingual nitroglycerin and morphine sulfate for pain. - Cardiology consult can be considered in a.m. though I think the patient's pain is atypical.  Acute kidney injury superimposed on chronic kidney disease ([place]) - The patient will be hydrated with IV normal saline. - We will follow BMP. - We will avoid nephrotoxins.  Uncontrolled type 2 diabetes mellitus with hyperglycemia, without long-term current use of insulin ([place]) - The patient be placed on supplement coverage with NovoLog. - Continue Farxiga. - We will hold off metformin.  Dyslipidemia - The patient be continued on statin therapy.       DVT prophylaxis: Lovenox.  Advanced Care Planning:  Code Status: full code.  Family Communication:  The plan of care was discussed in details with the patient (and family). I answered all questions. The patient agreed to proceed with the above mentioned plan. Further management will depend upon hospital course. Disposition Plan: Back to previous home environment Consults called: none.  All the records are reviewed and case discussed with ED provider.  Status is: Inpatient  At the time of  the admission, it appears that the appropriate admission status for this patient is inpatient.  This is judged to be reasonable and necessary in order to provide the required intensity of service to ensure  the patient's safety given the presenting symptoms, physical exam findings and initial radiographic and laboratory data in the context of comorbid conditions.  The patient requires inpatient status due to high intensity of service, high risk of further deterioration and high frequency of surveillance required.  I certify that at the time of admission, it is my clinical judgment that the patient will require inpatient hospital care extending more than 2 midnights.                            Dispo: The patient is from: Home              Anticipated d/c is to: Home              Patient currently is not medically stable to d/c.              Difficult to place patient: No  Christel Mormon M.D on 04/22/2021 at 11:06 PM  [facility]   From 7 PM-7 AM, contact night-coverage [email or web address]  CC: Primary care physician; Mackie Pai, PA-C

## 2021-04-22 NOTE — Assessment & Plan Note (Signed)
-   This is manifested by elevated lactic acid and hypotension. ?- Management as above. ?

## 2021-04-22 NOTE — Patient Instructions (Addendum)
Cellulitis of left lower ext that you describe as worsening since Saturday despite starting doxycycline. Under scenario such as this with recent high wbc count would recommend ED evaluation again to get stat cbc/wbc, lactic acid, possible re-imaging and iv antibiotics. Work up to evaluate if admission needed since appearing to fail out patient therapy. ? ?I talked with ED MD and informed of the above. Sending down for stat evaluation and tx. ? ?We discussed with failed out pt oral antibiotic treatment admission might be necessary. In that event transfer by ambulance would be needed. Other option might be to go directly to [facility] or [place] [facility] but wait might be longer for initial evaluation? ? ?Follow up with me as needed/as recommended  post ED evaluation ? ? ? ? ? ? ?

## 2021-04-22 NOTE — Progress Notes (Signed)
? ?Subjective:  ? ? Patient ID: [name], female    DOB: [birth year], 56 y.o.   MRN: [id number] ? ?HPI ? ?Pt seen just recently at on 04/19/2021. ? ?Arrival date & time: 04/19/21  1443 ?  ?"History ?  ?   ?Chief Complaint  ?Patient presents with  ? Leg Pain  ?  ?  ?[name] is a 56 y.o. female here for evaluation of left calf pain and feeling unwell. Also with congestion and rhinorrhea for a few days. Noted redness and pain to left lateral aspect calf. No hx of PE, DVT. Denies CP, SOB. No recent surgery or immobilization. No recent covid exposures. Feels generally unwell. Seen by PCP today, unable to get [place] outpatient to R/O DVT and sent here for further evaluation. No HA, neck pain, neck stiffness, CP, SOB, ABD pain, numbness, weakness. No meds PTA. Did not know she had a fever until arrival." ? ?Exam in ED. ? ?HENT:  ?   Head: Atraumatic.  ?   Jaw: There is normal jaw occlusion.  ?   Nose: Congestion and rhinorrhea present. Rhinorrhea is purulent.  ?   Right Turbinates: Enlarged.  ?   Left Turbinates: Enlarged.  ?   Right Sinus: No maxillary sinus tenderness or frontal sinus tenderness.  ?   Left Sinus: No maxillary sinus tenderness or frontal sinus tenderness.  ?   Comments: Congestion, yellow rhinorrhea bilateral nares.  Large turbinates bilaterally ?   Mouth/Throat:  ?   Lips: Pink.  ?   Mouth: Mucous membranes are moist.  ?   Pharynx: Oropharynx is clear.  ?   Tonsils: No tonsillar exudate or tonsillar abscesses.  ?   Comments: Posterior pharynx clear, tongue midline.  No PTA, RPA, no tonsillar edema, exudate ? ? ?     Legs: ?   Comments: Redness and warmth to left lateral calf. Compartment soft. Non bony tenderness. Full ROM to ankle/ knee without edema, erythema, warmth to anterior knee. No obvious effusion. No crepitus to skin. No midline C/T/L tenderness  ?Skin: ?   General: Skin is warm and dry.  ?   Capillary Refill: Capillary refill takes less than 2 seconds.  ?   Findings: Erythema  present.  ?   Comments: Erythema, warmth to left lateral calf ? ?Wbc count 15.1  ? ?ED Course/ Medical Decision Making/ A&P ?  ?56 year old here for evaluation of left lateral calf pain.  Sent by PCP who saw patient earlier today with concern for DVT.  Patient denies any chest pain, shortness of breath.  She does admit to some vague upper respiratory symptoms with rhinorrhea, congestion, sinus pressure and intermittent cough.  She denies any prior history of PE, DVT, recent surgery, lesion or malignancy.  On arrival she is febrile and tachycardic however appears otherwise well.  She has full range of motion to her joints.  No overlying erythema, warmth to her joints however does have erythema, warmth and significant tenderness to her left lateral calf.  No streaking, lesions.  Neurovascularly intact, ambulatory.  Patient did not know she had a fever until arrival.  We will plan on labs, imaging and reassess. ?  ?Labs and imaging personally viewed and interpreted: ?  ?CBC leukocytosis 15.1 ?BMP sodium 134, glucose 358, creatinine 1.54 ?[place] negative for DVT, does show venous varicosities ?BC pending ?COVID/Flu neg ?Lactic 1.2 ? ?Patient reassessed.  Discussed labs and imaging.  She was given pain control, defervesced while here in the emergency  department.  Also gave IV antibiotics.  Start her on p.o. antibiotics, discussed close monitoring of site for likely cellulitis.  At this time I low suspicion for deep space infection, abscess, myositis, bony infection, gas forming organism, PE, DVT, ischemia. ?  ?We will have her follow-up with PCP early next week patient agreeable.  She will return for any worsening symptoms ?  ?The patient has been appropriately medically screened and/or stabilized in the ED. I have low suspicion for any other emergent medical condition which would require further screening, evaluation or treatment in the ED or require inpatient management. ?  ?Patient is hemodynamically stable and in no  acute distress.  Patient able to ambulate in department prior to ED.  Evaluation does not show acute pathology that would require ongoing or additional emergent interventions while in the emergency department or further inpatient treatment.  I have discussed the diagnosis with the patient and answered all questions.  Pain is been managed while in the emergency department and patient has no further complaints prior to discharge.  Patient is comfortable with plan discussed in room and is stable for discharge at this time.  I have discussed strict return precautions for returning to the emergency department.  Patient was encouraged to follow-up with PCP/specialist refer to at discharge.  ? ? ? ?Pt tells me that her leg has worsened. She describes small in pretibial region of only 6 cm x 6 cm. Smaller red area latera knee as well.  ? ?Pt started the doxycycline on Saturday. Despite getting doxycyline the area of redness has expanded significantly.  ? ? ? ? ?Review of Systems  ?Constitutional:  Positive for chills, diaphoresis and fever. Negative for fatigue.  ?     Sweating yesterday intermittently with chills.  ?Respiratory:  Negative for cough, shortness of breath and wheezing.   ?Cardiovascular:  Negative for chest pain and palpitations.  ?Gastrointestinal:  Negative for abdominal pain.  ?Musculoskeletal:  Negative for back pain and joint swelling.  ?Neurological:  Negative for dizziness, light-headedness, numbness and headaches.  ?Hematological:  Negative for adenopathy. Does not bruise/bleed easily.  ?Psychiatric/Behavioral:  Negative for behavioral problems and confusion.   ? ? ? ?   ?Objective:  ? Physical Exam ? ?General- No acute distress. Pleasant patient. ?Neck- Full range of motion, no jvd ?Lungs- Clear, even and unlabored. ?Heart- regular rate and rhythm. ?Neurologic- CNII- XII grossly intact.  ? ?Skin- Left lower extremity- 16 cm x 14 cm pretibial region are of warmth, indurated and tenderness with small  blister like lesion in center.  ? ?Skin- left lateral knee ara of redness. 6 cm x 6 cm.  ? ? ? ? ? ?   ?Assessment & Plan:  ? ?Patient Instructions  ?Cellulitis of left lower ext that you describe as worsening since Saturday despite starting doxycycline. Under scenario such as this with recent high wbc count would recommend ED evaluation again to get stat cbc/wbc, lactic acid, possible re-imaging and iv antibiotics. Work up to evaluate if admission needed since appearing to fail out patient therapy. ? ?I talked with ED MD and informed of the above. Sending down for stat evaluation and tx. ? ?We discussed with failed out pt oral antibiotic treatment admission might be necessary. In that event transfer by ambulance would be needed. Other option might be to go directly to [facility] or [facility] but wait might be longer for initial evaluation? ? ?Follow up with me as needed/as recommended  post ED evaluation ? ? ? ? ? ?  Mackie Pai, PA-C  ?

## 2021-04-22 NOTE — Progress Notes (Signed)
Pharmacy Antibiotic Note ? ?[name] is a 56 y.o. female admitted on 04/22/2021 with cellulitis.  Pharmacy has been consulted for vancomycin dosing. Pt is afebrile and WBC is WNL. Scr is elevated above baseline at 1.59. Lactic acid is also elevated. ? ?Plan: ?Vancomycin 1gm IV x 1 then '1250mg'$  IV Q24H  ?F/u renal fxn, C&S, clinical status and peak/trough at [facility] ? ?Height: 6' (182.9 cm) ?Weight: 76.7 kg (169 lb) ?IBW/kg (Calculated) : 73.1 ? ?Temp (24hrs), Avg:98 ?F (36.7 ?C), Min:97.8 ?F (36.6 ?C), Max:98.1 ?F (36.7 ?C) ? ?Recent Labs  ?Lab 04/19/21 ?1611 04/19/21 ?1616 04/22/21 ?4034  ?WBC 15.1*  --  9.9  ?CREATININE 1.54*  --  1.59*  ?LATICACIDVEN  --  1.2 2.3*  ?  ?Estimated Creatinine Clearance: 45.6 mL/min (A) (by C-G formula based on SCr of 1.59 mg/dL (H)).   ? ?Allergies  ?Allergen Reactions  ? Insulin Aspart Other (See Comments) and Swelling  ?  adverse reaction to Novolog ?  ? Latex Itching and Rash  ? Morphine Itching and Rash  ? ? ?Antimicrobials this admission: ?Vanc 3/13>> ? ?Dose adjustments this admission: ?N/A ? ?Microbiology results: ?Pending ? ?Thank you for allowing pharmacy to be a part of this patient?s care. ? ?[name], [name] ?04/22/2021 1:43 PM ? ?

## 2021-04-22 NOTE — Assessment & Plan Note (Signed)
-   The patient will be admitted to a medical monitored bed. - We will place her on IV cefepime and vancomycin. --Patient management will be provided. - Warm compresses will be up applied.

## 2021-04-22 NOTE — Assessment & Plan Note (Addendum)
-   The patient be placed on supplement coverage with NovoLog. ?- Continue [place]. ?- We will hold off metformin. ?

## 2021-04-22 NOTE — Assessment & Plan Note (Signed)
-   We will follow serial troponins. - Patient be placed on aspirin as well as as needed sublingual nitroglycerin and morphine sulfate for pain. - Cardiology consult can be considered in a.m. though I think the patient's pain is atypical.

## 2021-04-22 NOTE — ED Notes (Signed)
Patient transported to X-ray 

## 2021-04-22 NOTE — Progress Notes (Addendum)
Pharmacy Antibiotic Note ? ?[name] is a 56 y.o. female admitted on 04/22/2021 with cellulitis.  Pharmacy has been consulted for vancomycin/cefepime dosing. Pt is afebrile and WBC is WNL. Scr is elevated above baseline at 1.59. Lactic acid is also elevated. ? ?Plan: ?Vancomycin 1gm IV x 1 at [facility] then '1250mg'$  IV Q24H  ?Cefepime 2gm q12hr ?F/u renal fxn, C&S, clinical status and peak/trough at [facility] ? ?Height: 6' (182.9 cm) ?Weight: 76.7 kg (169 lb) ?IBW/kg (Calculated) : 73.1 ? ?Temp (24hrs), Avg:98.6 ?F (37 ?C), Min:97.8 ?F (36.6 ?C), Max:99.4 ?F (37.4 ?C) ? ?Recent Labs  ?Lab 04/19/21 ?1611 04/19/21 ?1616 04/22/21 ?1244 04/22/21 ?1447  ?WBC 15.1*  --  9.9  --   ?CREATININE 1.54*  --  1.59*  --   ?LATICACIDVEN  --  1.2 2.3* 3.4*  ? ?  ?Estimated Creatinine Clearance: 45.6 mL/min (A) (by C-G formula based on SCr of 1.59 mg/dL (H)).   ? ?Allergies  ?Allergen Reactions  ? Insulin Aspart Other (See Comments) and Swelling  ?  adverse reaction to Novolog ?  ? Latex Itching and Rash  ? Morphine Itching and Rash  ? ? ?Antimicrobials this admission: ?Vanc 3/13>> ?Cefepime 3/13>> ?Metronidazole 3/13>> ? ?Dose adjustments this admission: ?N/A ? ?Microbiology results: ?Pending ? ?Thank you for allowing pharmacy to be a part of this patient?s care. ? ?Donnald Garre, PharmD ?Clinical Pharmacist ? ?Please check AMION for all [place] numbers ?After 10:00 PM, call [place] [phone] ? ? ?

## 2021-04-22 NOTE — ED Provider Notes (Incomplete)
[place] EMERGENCY DEPARTMENT Provider Note   CSN: [id number] Arrival date & time: 04/22/21  1212     History Chief Complaint  Patient presents with   Chest Pain   Cellulitis    [name] is a 56 y.o. female.   Chest Pain     Home Medications Prior to Admission medications   Medication Sig Start Date End Date Taking? Authorizing Provider  albuterol (VENTOLIN HFA) 108 (90 Base) MCG/ACT inhaler Inhale 2 puffs into the lungs every 6 (six) hours as needed for wheezing or shortness of breath. 08/03/20   Saguier, Percell Miller, PA-C  amLODipine (NORVASC) 10 MG tablet Take 1 tablet by mouth once daily 02/05/21   Saguier, Percell Miller, PA-C  aspirin 81 MG tablet Take 81 mg by mouth daily.    [provider]  atorvastatin (LIPITOR) 80 MG tablet Take 1 tablet by mouth once daily 08/06/20   Saguier, Percell Miller, PA-C  azelastine (ASTELIN) 0.1 % nasal spray Place 2 sprays into both nostrils 2 (two) times daily. 04/09/20   Colon Branch, MD  busPIRone (BUSPAR) 15 MG tablet Take 1 tablet (15 mg total) by mouth 2 (two) times daily. 10/30/20   Saguier, Percell Miller, PA-C  carvedilol (COREG) 25 MG tablet Take 1 tablet (25 mg total) by mouth 2 (two) times daily with a meal. 12/17/20   Saguier, Percell Miller, PA-C  Cholecalciferol (VITAMIN D3) 10 MCG (400 UNIT) CAPS Take 400 mg by mouth daily. 12/17/20   Saguier, Percell Miller, PA-C  doxycycline (VIBRAMYCIN) 100 MG capsule Take 1 capsule (100 mg total) by mouth 2 (two) times daily. 04/19/21   Henderly, Britni A, PA-C  famotidine (PEPCID) 20 MG tablet Take 1 tablet (20 mg total) by mouth daily. 04/03/21   Saguier, Percell Miller, PA-C  FARXIGA 10 MG TABS tablet Take 1 tablet by mouth once daily 07/21/19   Saguier, Percell Miller, PA-C  fluticasone [facility]) 50 MCG/ACT nasal spray Place 2 sprays into both nostrils daily. 09/27/19   Saguier, Percell Miller, PA-C  fluticasone furoate-vilanterol (BREO ELLIPTA) 100-25 MCG/INH AEPB Inhale 1 puff into the lungs daily. 08/03/20   Saguier, Percell Miller,  PA-C  gabapentin (NEURONTIN) 300 MG capsule Take 2 capsules (600 mg total) by mouth 3 (three) times daily. 12/17/20   Saguier, Percell Miller, PA-C  insulin degludec (TRESIBA) 100 UNIT/ML FlexTouch Pen Inject 40 Units into the skin daily at 10 pm. 11/16/20   Saguier, Percell Miller, PA-C  meloxicam (MOBIC) 7.5 MG tablet Take 1-2 tablets (7.5-15 mg total) by mouth daily. 03/21/21   Saguier, Percell Miller, PA-C  metFORMIN (GLUCOPHAGE-XR) 500 MG 24 hr tablet Take 1 tablet (500 mg total) by mouth daily with breakfast. 01/15/21   Saguier, Percell Miller, PA-C  methocarbamol (ROBAXIN) 750 MG tablet Take 1 tablet (750 mg total) by mouth every 8 (eight) hours as needed for muscle spasms. 04/01/21   Saguier, Percell Miller, PA-C  nitroGLYCERIN (NITROSTAT) 0.4 MG SL tablet Place 1 tablet (0.4 mg total) under the tongue every 5 (five) minutes as needed for chest pain. 03/21/21   Saguier, Percell Miller, PA-C  oxyCODONE (ROXICODONE) 5 MG immediate release tablet Take 1 tablet (5 mg total) by mouth every 6 (six) hours as needed for severe pain. 03/21/21   Saguier, Percell Miller, PA-C  promethazine (PHENERGAN) 12.5 MG tablet Take 1 tablet (12.5 mg total) by mouth every 8 (eight) hours as needed. 12/17/20   Saguier, Percell Miller, PA-C  promethazine (PHENERGAN) 12.5 MG tablet Take 1 tablet (12.5 mg total) by mouth every 8 (eight) hours as needed for nausea or vomiting. 04/03/21  Saguier, Percell Miller, PA-C  rizatriptan (MAXALT-MLT) 10 MG disintegrating tablet Take 10 mg by mouth daily as needed for migraine. 04/17/20   [provider]  sertraline (ZOLOFT) 100 MG tablet Take 1 tablet (100 mg total) by mouth daily. 08/03/20   Saguier, Percell Miller, PA-C  spironolactone (ALDACTONE) 25 MG tablet Take 1 tablet by mouth once daily 07/21/19   Saguier, Percell Miller, PA-C  topiramate (TOPAMAX) 50 MG tablet Take 1 tablet (50 mg total) by mouth 2 (two) times daily. 04/17/20   Penumalli, Earlean Polka, MD      Allergies    Insulin aspart, Latex, and Morphine    Review of Systems   Review of Systems   Cardiovascular:  Positive for chest pain.   Physical Exam Updated Vital Signs BP (!) 90/58    Pulse 78    Temp 98.1 F (36.7 C) (Oral)    Resp 16    Ht 6' (1.829 m)    Wt 76.7 kg    SpO2 97%    BMI 22.92 kg/m  Physical Exam Vitals and nursing note reviewed.  Constitutional:      Appearance: Normal appearance.  HENT:     Head: Normocephalic and atraumatic.  Eyes:     General: No scleral icterus. Cardiovascular:     Rate and Rhythm: Normal rate and regular rhythm.  Pulmonary:     Effort: Pulmonary effort is normal.     Breath sounds: Normal breath sounds.  Abdominal:     General: Abdomen is flat. Bowel sounds are normal.     Palpations: Abdomen is soft.  Musculoskeletal:        General: No deformity.     Cervical back: Normal range of motion.  Skin:    General: Skin is warm and dry.  Neurological:     General: No focal deficit present.     Mental Status: She is alert. Mental status is at baseline.    ED Results / Procedures / Treatments   Labs (all labs ordered are listed, but only abnormal results are displayed) Labs Reviewed  CULTURE, BLOOD (ROUTINE X 2)  CULTURE, BLOOD (ROUTINE X 2)  CBC  BASIC METABOLIC PANEL  LACTIC ACID, PLASMA  LACTIC ACID, PLASMA  PROTIME-INR  APTT  URINALYSIS, ROUTINE W REFLEX MICROSCOPIC  PREGNANCY, URINE  HEPATIC FUNCTION PANEL  TROPONIN I (HIGH SENSITIVITY)    EKG None  Radiology DG Chest 2 View  Result Date: 04/22/2021 CLINICAL DATA:  Chest pain EXAM: CHEST - 2 VIEW COMPARISON:  04/16/2020 FINDINGS: The heart size and mediastinal contours are within normal limits. Both lungs are clear. The visualized skeletal structures are unremarkable. IMPRESSION: No acute abnormality of the lungs. Electronically Signed   By: Delanna Ahmadi M.D.   On: 04/22/2021 12:37    Procedures Procedures   Medications Ordered in ED Medications  lactated ringers bolus 1,000 mL (has no administration in time range)    ED Course/ Medical Decision  Making/ A&P Clinical Course as of 04/22/21 1546  Mon Apr 22, 2021  1336 This is a 56 year old female presenting to ED with left leg pain and swelling.  She reports that she had some erythema that was treated with an antibiotic, doxycycline, started yesterday, she has taken 2 doses yesterday and a dose this morning.  She notes that her redness has not spread further up and down her leg.  She has some swelling of the leg.  She is diabetic.  She is afebrile her blood pressures on the softer side, MAP of  69.  She says her blood pressure tends to run exactly normal, on 10 mg amlodipine.  Labs today show a lactate of 2.3, white blood cell count of 9.9 (down from 15.1 3 days ago).  Overall clinically this presentation is concern for worsening cellulitis with possible filled out treatment, although she has not completed a full 2 days, its clear that the erythema is extending despite her doxycycline.  We will initiate her on vancomycin for staph coverage.  She will need a repeat lactate after her fluid bolus.  I think blood cultures sepsis and bacteremia rule out are reasonable given her borderline hypotension.  She is otherwise clinically well-appearing on exam, does not appear toxic, I have a low suspicion for septic shock at this point. [MT]  7416 I do not appreciate any crepitus of the underlying skin or significant pain to suggest necrotizing fasciitis. [MT]    Clinical Course User Index [MT] Trifan, Carola Rhine, MD                           Medical Decision Making Amount and/or Complexity of Data Reviewed Labs: ordered. Radiology: ordered.     {Document critical care time when appropriate:1} {Document review of labs and clinical decision tools ie heart score, Chads2Vasc2 etc:1}  {Document your independent review of radiology images, and any outside records:1} {Document your discussion with family members, caretakers, and with consultants:1} {Document social determinants of health affecting pt's  care:1} {Document your decision making why or why not admission, treatments were needed:1} Final Clinical Impression(s) / ED Diagnoses Final diagnoses:  None    Rx / DC Orders ED Discharge Orders     None

## 2021-04-22 NOTE — ED Triage Notes (Signed)
Was seen at PCP for left leg infection , mid leg redness and swelling , obvious infection . ? ?Also reports throbbing mid chest pain started today , tingling to left fingers .  ?

## 2021-04-22 NOTE — Assessment & Plan Note (Signed)
-   The patient be continued on statin therapy. ?

## 2021-04-23 ENCOUNTER — Inpatient Hospital Stay (HOSPITAL_COMMUNITY): Payer: Self-pay

## 2021-04-23 DIAGNOSIS — R778 Other specified abnormalities of plasma proteins: Secondary | ICD-10-CM

## 2021-04-23 DIAGNOSIS — R079 Chest pain, unspecified: Secondary | ICD-10-CM

## 2021-04-23 LAB — GLUCOSE, CAPILLARY
Glucose-Capillary: 115 mg/dL — ABNORMAL HIGH (ref 70–99)
Glucose-Capillary: 155 mg/dL — ABNORMAL HIGH (ref 70–99)
Glucose-Capillary: 89 mg/dL (ref 70–99)
Glucose-Capillary: 94 mg/dL (ref 70–99)

## 2021-04-23 LAB — ECHOCARDIOGRAM COMPLETE
AR max vel: 2.92 cm2
AV Area VTI: 3.05 cm2
AV Area mean vel: 2.97 cm2
AV Mean grad: 3 mmHg
AV Peak grad: 4.8 mmHg
Ao pk vel: 1.1 m/s
Area-P 1/2: 4.68 cm2
Calc EF: 61.7 %
Height: 72 in
S' Lateral: 3 cm
Single Plane A2C EF: 63.5 %
Single Plane A4C EF: 59.3 %
Weight: 2704 oz

## 2021-04-23 LAB — CBC
HCT: 32.5 % — ABNORMAL LOW (ref 36.0–46.0)
Hemoglobin: 10.6 g/dL — ABNORMAL LOW (ref 12.0–15.0)
MCH: 29.3 pg (ref 26.0–34.0)
MCHC: 32.6 g/dL (ref 30.0–36.0)
MCV: 89.8 fL (ref 80.0–100.0)
Platelets: 279 10*3/uL (ref 150–400)
RBC: 3.62 MIL/uL — ABNORMAL LOW (ref 3.87–5.11)
RDW: 14.9 % (ref 11.5–15.5)
WBC: 8.1 10*3/uL (ref 4.0–10.5)
nRBC: 0 % (ref 0.0–0.2)

## 2021-04-23 LAB — BASIC METABOLIC PANEL
Anion gap: 8 (ref 5–15)
BUN: 14 mg/dL (ref 6–20)
CO2: 22 mmol/L (ref 22–32)
Calcium: 8.4 mg/dL — ABNORMAL LOW (ref 8.9–10.3)
Chloride: 108 mmol/L (ref 98–111)
Creatinine, Ser: 1.15 mg/dL — ABNORMAL HIGH (ref 0.44–1.00)
GFR, Estimated: 56 mL/min — ABNORMAL LOW (ref >60)
Glucose, Bld: 257 mg/dL — ABNORMAL HIGH (ref 70–99)
Potassium: 3.8 mmol/L (ref 3.5–5.1)
Sodium: 138 mmol/L (ref 135–145)

## 2021-04-23 LAB — MRSA NEXT GEN BY PCR, NASAL: MRSA by PCR Next Gen: NOT DETECTED

## 2021-04-23 LAB — PROTIME-INR
INR: 1 (ref 0.8–1.2)
Prothrombin Time: 13.6 seconds (ref 11.4–15.2)

## 2021-04-23 LAB — TROPONIN I (HIGH SENSITIVITY): Troponin I (High Sensitivity): 8 ng/L (ref <18)

## 2021-04-23 LAB — HEMOGLOBIN A1C
Hgb A1c MFr Bld: 12.3 % — ABNORMAL HIGH (ref 4.8–5.6)
Mean Plasma Glucose: 306 mg/dL

## 2021-04-23 LAB — CORTISOL-AM, BLOOD: Cortisol - AM: 6.9 ug/dL (ref 6.7–22.6)

## 2021-04-23 LAB — PROCALCITONIN: Procalcitonin: 0.52 ng/mL

## 2021-04-23 MED ORDER — INSULIN ASPART 100 UNIT/ML IJ SOLN
0.0000 [IU] | Freq: Three times a day (TID) | INTRAMUSCULAR | Status: DC
  Administered 2021-04-23: 2 [IU] via SUBCUTANEOUS
  Administered 2021-04-24: 3 [IU] via SUBCUTANEOUS

## 2021-04-23 MED ORDER — SODIUM CHLORIDE 0.9 % IV SOLN: INTRAVENOUS | Status: DC

## 2021-04-23 MED ORDER — ENSURE PRE-SURGERY PO LIQD
296.0000 mL | Freq: Once | ORAL | Status: AC
  Administered 2021-04-24: 296 mL via ORAL
  Filled 2021-04-23: qty 296

## 2021-04-23 MED ORDER — VANCOMYCIN HCL 1500 MG/300ML IV SOLN
1500.0000 mg | INTRAVENOUS | Status: DC
  Administered 2021-04-24 – 2021-05-01 (×8): 1500 mg via INTRAVENOUS
  Filled 2021-04-23 (×11): qty 300

## 2021-04-23 MED ORDER — OXYCODONE-ACETAMINOPHEN 5-325 MG PO TABS
1.0000 | ORAL_TABLET | Freq: Four times a day (QID) | ORAL | Status: AC
  Administered 2021-04-23 – 2021-04-26 (×7): 1 via ORAL
  Filled 2021-04-23 (×9): qty 1

## 2021-04-23 MED ORDER — HYDROMORPHONE HCL 1 MG/ML IJ SOLN
1.0000 mg | INTRAMUSCULAR | Status: DC | PRN
  Administered 2021-04-23 (×2): 1 mg via INTRAVENOUS
  Filled 2021-04-23 (×2): qty 1

## 2021-04-23 MED ORDER — POVIDONE-IODINE 10 % EX SWAB: 2.0000 "application " | Freq: Once | CUTANEOUS | Status: DC

## 2021-04-23 MED ORDER — CEFAZOLIN SODIUM-DEXTROSE 2-4 GM/100ML-% IV SOLN
2.0000 g | INTRAVENOUS | Status: AC
  Administered 2021-04-24: 2 g via INTRAVENOUS
  Filled 2021-04-23: qty 100

## 2021-04-23 MED ORDER — CHLORHEXIDINE GLUCONATE 4 % EX LIQD
60.0000 mL | Freq: Once | CUTANEOUS | Status: DC
Start: 2021-04-23 — End: 2021-04-24
  Filled 2021-04-23: qty 60

## 2021-04-23 MED ORDER — FENTANYL CITRATE PF 50 MCG/ML IJ SOSY: 25.0000 ug | PREFILLED_SYRINGE | INTRAMUSCULAR | Status: DC | PRN

## 2021-04-23 NOTE — Plan of Care (Signed)

## 2021-04-23 NOTE — Consult Note (Signed)
Reason for Consult:Left leg cellulitis ?Referring Physician: Terrilee Croak ?Time called: 1312 ?Time at bedside: 1400 ? ? ?[name] is an 56 y.o. female.  ?HPI: Cyriah developed left lower leg redness and pain on Thursday. She sought care and was placed on outpatient doxycycline. Her redness and pain only worsened and she presented to the ED and was admitted yesterday. Orthopedic surgery was consulted today as she is not improving. She has had associate fevers, chills, sweats, and nausea. She denies any antecedent event or prior hx/o similar. ? ?Past Medical History:  ?Diagnosis Date  ? ABDOMINAL PAIN-EPIGASTRIC 09/14/2009  ? Qualifier: Diagnosis of  By: Shane Crutch, [name]   ? Acute cystitis 07/07/2017  ? ANKLE PAIN, LEFT 12/25/2009  ? Qualifier: Diagnosis of  By: Amil Amen MD, Benjamine Mola    ? Anxiety 01/22/2016  ? CAD (coronary artery disease) 07/07/2017  ? Cardiac murmur 02/01/2019  ? Cardiomyopathy, secondary ([place]) 09/06/2009  ? Qualifier: Diagnosis of  By: Arvid Right   Formatting of this note might be different from the original. Overview:  Qualifier: Diagnosis of  By: Arvid Right  ? Cardiomyopathy, unspecified ([place]) 09/06/2009  ? Formatting of this note might be different from the original. Overview:  Overview:  Qualifier: Diagnosis of  By: Arvid Right  Overview:  Overview:  Qualifier: Diagnosis of  By: Arvid Right Formatting of this note might be different from the original. Overview:  Qualifier: Diagnosis of  By: Arvid Right  ? Cerebrovascular disease 09/13/2012  ? Cervical cancer ([place])   ? had surgery in 2001.  ? CHEST PAIN, ATYPICAL 07/30/2009  ? Qualifier: Diagnosis of  By: Amil Amen MD, Winona Legato of this note might be different from the original. Overview:  Qualifier: Diagnosis of  By: Amil Amen MD, Benjamine Mola  ? Chronic back pain   ? Chronic kidney disease, stage III (moderate) ([place]) 05/23/2014  ?  Chronic pain syndrome 01/23/2016  ? Coronary artery disease   ? 30% lesions noted  ? Current use of insulin ([place]) 05/23/2014  ? Depression   ? Diabetes mellitus   ? DIABETES MELLITUS, TYPE II, UNCONTROLLED 07/30/2009  ? Qualifier: Diagnosis of  By: Amil Amen MD, Benjamine Mola    ? Diabetes type 2, uncontrolled 05/23/2014  ? Diabetic gastroparesis associated with type 1 diabetes mellitus ([place]) 11/27/2016  ? DIABETIC PERIPHERAL NEUROPATHY 09/21/2009  ? Qualifier: Diagnosis of  By: Amil Amen MD, Benjamine Mola    ? Diabetic peripheral neuropathy ([place]) 01/23/2016  ? Dysuria 12/25/2009  ? Qualifier: Diagnosis of  By: Amil Amen MD, Benjamine Mola    ? Elevation of level of transaminase or lactic acid dehydrogenase (LDH) 09/21/2009  ? Formatting of this note might be different from the original. Overview:  Qualifier: Diagnosis of  By: Amil Amen MD, Benjamine Mola  ? Essential hypertension 08/30/2009  ? Qualifier: Diagnosis of  By: Lovette Cliche CNA, Christy    ? Gastric atony 07/30/2009  ? Formatting of this note might be different from the original. Overview:  Qualifier: Diagnosis of  By: Amil Amen MD, Benjamine Mola  ? Gastroesophageal reflux disease 07/30/2009  ? Formatting of this note might be different from the original. Overview:  Overview:  Qualifier: Diagnosis of  By: Amil Amen MD, Elby Showers: Diagnosis of  By: Chester Holstein NP, Ellwood Handler of this note might be different from the original. Overview:  Qualifier: Diagnosis of  By: Amil Amen MD, Elby Showers: Diagnosis of  By: Chester Holstein NP, Nevin Bloodgood  ? Gastroparesis 07/30/2009  ?  Qualifier: Diagnosis of  By: Amil Amen MD, Benjamine Mola    ? GERD 07/30/2009  ? Qualifier: Diagnosis of  By: Amil Amen MD, Elby Showers: Diagnosis of  By: Chester Holstein NP, Nevin Bloodgood    ? GERD (gastroesophageal reflux disease)   ? History of cervical cancer 08/17/2015  ? Status post partial hysterectomy  Formatting of this note might be different from the original. Status post partial hysterectomy  ? HLD (hyperlipidemia)  07/30/2009  ? Qualifier: Diagnosis of  By: Amil Amen MD, Benjamine Mola    ? Hyperlipidemia   ? Hyperlipidemia, unspecified 07/30/2009  ? Formatting of this note might be different from the original. Overview:  Qualifier: Diagnosis of  By: Amil Amen MD, Winona Legato of this note might be different from the original. Overview:  Qualifier: Diagnosis of  By: Amil Amen MD, Benjamine Mola  ? Hypertension   ? INSOMNIA 09/21/2009  ? Qualifier: Diagnosis of  By: Amil Amen MD, Benjamine Mola    ? Lumbar facet arthropathy 05/14/2016  ? Lumbar spinal stenosis 02/11/2018  ? Metatarsalgia of both feet 03/11/2017  ? Migraine 09/13/2012  ? Overview:  IMPRESSION: possible abd migraine  ? Mixed dyslipidemia 02/01/2019  ? Nausea & vomiting 07/07/2017  ? Nausea with vomiting, unspecified 09/14/2009  ? Qualifier: Diagnosis of  By: Shane Crutch, [name]   Formatting of this note might be different from the original. Overview:  Qualifier: Diagnosis of  By: Shane Crutch, [name]  ? Neuropathy   ? Nonspecific abnormal findings on radiological and examination of skull and head 09/13/2012  ? Overweight (BMI 25.0-29.9) 07/21/2014  ? POSITIVE PPD 07/30/2009  ? Annotation: CXR clear ?  diagnosed in 2/11?  On INH/pyridoxine Qualifier: Diagnosis of  By: Amil Amen MD, Benjamine Mola    ? Pure hypercholesterolemia 11/27/2016  ? Retention of urine 06/25/2011  ? Right flank pain   ? TOBACCO ABUSE 09/21/2009  ? Qualifier: Diagnosis of  By: Amil Amen MD, Winona Legato of this note might be different from the original. Overview:  Qualifier: Diagnosis of  By: Amil Amen MD, Benjamine Mola  ? TRANSAMINASES, SERUM, ELEVATED 09/21/2009  ? Qualifier: Diagnosis of  By: Amil Amen MD, Benjamine Mola    ? Trigeminal neuralgia   ? Tuberculin test reaction 07/30/2009  ? Formatting of this note might be different from the original. Overview:  Annotation: CXR clear ?  diagnosed in 2/11?  On INH/pyridoxine Qualifier: Diagnosis of  By: Amil Amen MD, Benjamine Mola  ? Type 2 diabetes mellitus with hyperglycemia  ([place]) 07/30/2009  ? Qualifier: Diagnosis of  By: Amil Amen MD, Winona Legato of this note might be different from the original. Overview:  Qualifier: Diagnosis of  By: Amil Amen MD, Benjamine Mola  ? Type 2 diabetes mellitus with hyperglycemia, with long-term current use of insulin ([place]) 07/30/2009  ? Formatting of this note might be different from the original. Overview:  05/30/2015 A1C was 16.7% (!)  Overview:  05/30/2015 A1C was 16.7% Kingsley Callander of this note might be different from the original. 05/30/2015 A1C was 16.7% (!)  ? Type 2 diabetes mellitus with stage 3 chronic kidney disease ([place]) 09/14/2009  ? Qualifier: Diagnosis of  By: Shane Crutch, [name]   ? ULCER-GASTRIC 09/28/2009  ? Qualifier: Diagnosis of  By: Chester Holstein NP, Nevin Bloodgood    ? URINARY TRACT INFECTION 09/21/2009  ? Qualifier: Diagnosis of  By: Amil Amen MD, Benjamine Mola    ? VAGINITIS, CANDIDAL 12/25/2009  ? Qualifier: Diagnosis of  By: Amil Amen MD, Benjamine Mola    ? Vitamin D deficiency 04/25/2013  ? ? ?Past Surgical  History:  ?Procedure Laterality Date  ? ABDOMINAL HYSTERECTOMY    ? partial  ? CHOLECYSTECTOMY    ? ? ?Family History  ?Problem Relation Age of Onset  ? Diabetes Mother   ? Diabetes Father   ? ? ?Social History:  reports that she has never smoked. She has never used smokeless tobacco. She reports current alcohol use. She reports that she does not use drugs. ? ?Allergies:  ?Allergies  ?Allergen Reactions  ? Insulin Aspart Other (See Comments) and Swelling  ?  adverse reaction to Novolog ?  ? Latex Itching and Rash  ? Morphine Itching and Rash  ? ? ?Medications: I have reviewed the patient's current medications. ? ?Results for orders placed or performed during the hospital encounter of 04/22/21 (from the past 48 hour(s))  ?Basic metabolic panel     Status: Abnormal  ? Collection Time: 04/22/21 12:44 PM  ?Result Value Ref Range  ? Sodium 131 (L) 135 - 145 mmol/L  ? Potassium 3.8 3.5 - 5.1 mmol/L  ? Chloride 101 98 - 111 mmol/L  ? CO2 21 (L) 22 - 32  mmol/L  ? Glucose, Bld 310 (H) 70 - 99 mg/dL  ?  Comment: Glucose reference range applies only to samples taken after fasting for at least 8 hours.  ? BUN 23 (H) 6 - 20 mg/dL  ? Creatinine, Ser 1.59 (H) 0.44 - 1.00 mg/dL

## 2021-04-23 NOTE — Progress Notes (Signed)
? ?  Echocardiogram ?2D Echocardiogram has been performed. ? ?[name] ?04/23/2021, 12:09 PM ?

## 2021-04-23 NOTE — Consult Note (Signed)
?Cardiology Consultation:  ? ?Patient ID: [name] ?MRN: [id number]; DOB: [birth year] ? ?Admit date: 04/22/2021 ?Date of Consult: 04/23/2021 ? ?PCP:  Mackie Pai, PA-C ?  ?[facility] Providers ?Cardiologist:  Jenean Lindau, MD ? ? ?Patient Profile:  ? ?[name] is a 56 y.o. female with a hx of coronary artery disease (?), cardiomyopathy (?), HTN, T2DM with peripheral neuropathy, HLD, GERD, former smoker and CKD who is being seen 04/23/2021 for the evaluation of CHEST PAIN at the request of Dr. Sidney Peck. ? ?History of Present Illness:  ? ?[name] reports sudden onset left lower extremity swelling, warmth and pain since last Thursday. He was admitted for cellulitis management. Patient mentions to the primary team that she had one episode of chest pain and cardiology is consulted. Patient says she had one episode of mid-chest dull pain without radiation associated some heart palpitations yesterday. There is no precipitating, alleviating or aggravating factors she can call. The pain resolved on its own in a few hours. She says she has been anxious about her cellulitis treatment. Reports Stable weights. Denies chills, dizziness, syncope, dyspnea, nausea, vomiting, abdominal fullness, dysuria, diarrhea, pedal edema or any bleeding events. ? ?On admission ?Troponins x 3 <10, EKG x 2 on admission NSR. ? ? ?Past Medical History:  ?Diagnosis Date  ? ABDOMINAL PAIN-EPIGASTRIC 09/14/2009  ? Qualifier: Diagnosis of  By: Shane Crutch, [name]   ? Acute cystitis 07/07/2017  ? ANKLE PAIN, LEFT 12/25/2009  ? Qualifier: Diagnosis of  By: Amil Amen MD, Benjamine Mola    ? Anxiety 01/22/2016  ? CAD (coronary artery disease) 07/07/2017  ? Cardiac murmur 02/01/2019  ? Cardiomyopathy, secondary ([place]) 09/06/2009  ? Qualifier: Diagnosis of  By: Arvid Right   Formatting of this note might be different from the original. Overview:  Qualifier: Diagnosis of  By: Arvid Right  ?  Cardiomyopathy, unspecified ([place]) 09/06/2009  ? Formatting of this note might be different from the original. Overview:  Overview:  Qualifier: Diagnosis of  By: Arvid Right  Overview:  Overview:  Qualifier: Diagnosis of  By: Arvid Right Formatting of this note might be different from the original. Overview:  Qualifier: Diagnosis of  By: Arvid Right  ? Cerebrovascular disease 09/13/2012  ? Cervical cancer ([place])   ? had surgery in 2001.  ? CHEST PAIN, ATYPICAL 07/30/2009  ? Qualifier: Diagnosis of  By: Amil Amen MD, Winona Legato of this note might be different from the original. Overview:  Qualifier: Diagnosis of  By: Amil Amen MD, Benjamine Mola  ? Chronic back pain   ? Chronic kidney disease, stage III (moderate) ([place]) 05/23/2014  ? Chronic pain syndrome 01/23/2016  ? Coronary artery disease   ? 30% lesions noted  ? Current use of insulin ([place]) 05/23/2014  ? Depression   ? Diabetes mellitus   ? DIABETES MELLITUS, TYPE II, UNCONTROLLED 07/30/2009  ? Qualifier: Diagnosis of  By: Amil Amen MD, Benjamine Mola    ? Diabetes type 2, uncontrolled 05/23/2014  ? Diabetic gastroparesis associated with type 1 diabetes mellitus ([place]) 11/27/2016  ? DIABETIC PERIPHERAL NEUROPATHY 09/21/2009  ? Qualifier: Diagnosis of  By: Amil Amen MD, Benjamine Mola    ? Diabetic peripheral neuropathy ([place]) 01/23/2016  ? Dysuria 12/25/2009  ? Qualifier: Diagnosis of  By: Amil Amen MD, Benjamine Mola    ? Elevation of level of transaminase or lactic acid dehydrogenase (LDH) 09/21/2009  ? Formatting of this note might be different from the original. Overview:  Qualifier: Diagnosis of  By: Amil Amen MD, Benjamine Mola  ? Essential hypertension 08/30/2009  ? Qualifier: Diagnosis of  By: Lovette Cliche CNA, Christy    ? Gastric atony 07/30/2009  ? Formatting of this note might be different from the original. Overview:  Qualifier: Diagnosis of  By: Amil Amen MD, Benjamine Mola  ? Gastroesophageal reflux disease 07/30/2009  ? Formatting of this  note might be different from the original. Overview:  Overview:  Qualifier: Diagnosis of  By: Amil Amen MD, Elby Showers: Diagnosis of  By: Chester Holstein NP, Ellwood Handler of this note might be different from the original. Overview:  Qualifier: Diagnosis of  By: Amil Amen MD, Elby Showers: Diagnosis of  By: Chester Holstein NP, Nevin Bloodgood  ? Gastroparesis 07/30/2009  ? Qualifier: Diagnosis of  By: Amil Amen MD, Benjamine Mola    ? GERD 07/30/2009  ? Qualifier: Diagnosis of  By: Amil Amen MD, Elby Showers: Diagnosis of  By: Chester Holstein NP, Nevin Bloodgood    ? GERD (gastroesophageal reflux disease)   ? History of cervical cancer 08/17/2015  ? Status post partial hysterectomy  Formatting of this note might be different from the original. Status post partial hysterectomy  ? HLD (hyperlipidemia) 07/30/2009  ? Qualifier: Diagnosis of  By: Amil Amen MD, Benjamine Mola    ? Hyperlipidemia   ? Hyperlipidemia, unspecified 07/30/2009  ? Formatting of this note might be different from the original. Overview:  Qualifier: Diagnosis of  By: Amil Amen MD, Winona Legato of this note might be different from the original. Overview:  Qualifier: Diagnosis of  By: Amil Amen MD, Benjamine Mola  ? Hypertension   ? INSOMNIA 09/21/2009  ? Qualifier: Diagnosis of  By: Amil Amen MD, Benjamine Mola    ? Lumbar facet arthropathy 05/14/2016  ? Lumbar spinal stenosis 02/11/2018  ? Metatarsalgia of both feet 03/11/2017  ? Migraine 09/13/2012  ? Overview:  IMPRESSION: possible abd migraine  ? Mixed dyslipidemia 02/01/2019  ? Nausea & vomiting 07/07/2017  ? Nausea with vomiting, unspecified 09/14/2009  ? Qualifier: Diagnosis of  By: Shane Crutch, [name]   Formatting of this note might be different from the original. Overview:  Qualifier: Diagnosis of  By: Shane Crutch, [name]  ? Neuropathy   ? Nonspecific abnormal findings on radiological and examination of skull and head 09/13/2012  ? Overweight (BMI 25.0-29.9) 07/21/2014  ? POSITIVE PPD 07/30/2009  ? Annotation: CXR clear ?  diagnosed in  2/11?  On INH/pyridoxine Qualifier: Diagnosis of  By: Amil Amen MD, Benjamine Mola    ? Pure hypercholesterolemia 11/27/2016  ? Retention of urine 06/25/2011  ? Right flank pain   ? TOBACCO ABUSE 09/21/2009  ? Qualifier: Diagnosis of  By: Amil Amen MD, Winona Legato of this note might be different from the original. Overview:  Qualifier: Diagnosis of  By: Amil Amen MD, Benjamine Mola  ? TRANSAMINASES, SERUM, ELEVATED 09/21/2009  ? Qualifier: Diagnosis of  By: Amil Amen MD, Benjamine Mola    ? Trigeminal neuralgia   ? Tuberculin test reaction 07/30/2009  ? Formatting of this note might be different from the original. Overview:  Annotation: CXR clear ?  diagnosed in 2/11?  On INH/pyridoxine Qualifier: Diagnosis of  By: Amil Amen MD, Benjamine Mola  ? Type 2 diabetes mellitus with hyperglycemia ([place]) 07/30/2009  ? Qualifier: Diagnosis of  By: Amil Amen MD, Winona Legato of this note might be different from the original. Overview:  Qualifier: Diagnosis of  By: Amil Amen MD, Benjamine Mola  ? Type 2 diabetes mellitus with hyperglycemia, with long-term current use of insulin ([place]) 07/30/2009  ?  Formatting of this note might be different from the original. Overview:  05/30/2015 A1C was 16.7% (!)  Overview:  05/30/2015 A1C was 16.7% Kingsley Callander of this note might be different from the original. 05/30/2015 A1C was 16.7% (!)  ? Type 2 diabetes mellitus with stage 3 chronic kidney disease ([place]) 09/14/2009  ? Qualifier: Diagnosis of  By: Shane Crutch, [name]   ? ULCER-GASTRIC 09/28/2009  ? Qualifier: Diagnosis of  By: Chester Holstein NP, Nevin Bloodgood    ? URINARY TRACT INFECTION 09/21/2009  ? Qualifier: Diagnosis of  By: Amil Amen MD, Benjamine Mola    ? VAGINITIS, CANDIDAL 12/25/2009  ? Qualifier: Diagnosis of  By: Amil Amen MD, Benjamine Mola    ? Vitamin D deficiency 04/25/2013  ? ? ?Past Surgical History:  ?Procedure Laterality Date  ? ABDOMINAL HYSTERECTOMY    ? partial  ? CHOLECYSTECTOMY    ?  ? ? ? ?Inpatient Medications: ?Scheduled Meds: ? amLODipine  10 mg Oral Daily   ? aspirin EC  81 mg Oral Daily  ? atorvastatin  80 mg Oral Daily  ? carvedilol  25 mg Oral BID WC  ? cholecalciferol  400 Units Oral Daily  ? enoxaparin (LOVENOX) injection  40 mg Subcutaneous Q24H  ? famotidine

## 2021-04-23 NOTE — Progress Notes (Signed)
Pharmacy Antibiotic Note ? ?[name] is a 56 y.o. female admitted on 04/22/2021 with LLE cellulitis.  Pharmacy has been consulted for Vancomycin and Cefepime dosing. ? ?  Creatinine improved 1.59 (above baseline) > 1.15 after fluids. ?  Noted possible I&D and VAC placement tomorrow. ? ?Plan: ?Increase Vancomycin 1250 mg > 1500 mg IV q24hr. ?Goal AUC 400-550. ?Expected AUC: 458 ?SCr used: 1.15 ?Cefepime 2gm IV q12h remains appropriate. ?Follow renal function, culture data, clinical progress and antibiotic plans. ? ? ?Height: 6' (182.9 cm) ?Weight: 76.7 kg (169 lb) ?IBW/kg (Calculated) : 73.1 ? ?Temp (24hrs), Avg:98.8 ?F (37.1 ?C), Min:98.4 ?F (36.9 ?C), Max:99.4 ?F (37.4 ?C) ? ?Recent Labs  ?Lab 04/19/21 ?1611 04/19/21 ?1616 04/22/21 ?1244 04/22/21 ?1447 04/23/21 ?0240  ?WBC 15.1*  --  9.9  --  8.1  ?CREATININE 1.54*  --  1.59*  --  1.15*  ?LATICACIDVEN  --  1.2 2.3* 3.4*  --   ?  ?Estimated Creatinine Clearance: 63 mL/min (A) (by C-G formula based on SCr of 1.15 mg/dL (H)).   ? ?Allergies  ?Allergen Reactions  ? Insulin Aspart Other (See Comments) and Swelling  ?  adverse reaction to Novolog ?  ? Latex Itching and Rash  ? Morphine Itching and Rash  ? ? ?Antimicrobials this admission: ?Vancomycin 3/13>> ?Cefepime 3/13>> ?Metronidazole PO x 1 on 3/13 ? ?Dose adjustments this admission: ?3/14: empiric Vanc increase 1250 mg to 1500 mg IV q24h for improved creatinine ? ?Microbiology results: ?3/13: blood: no growth < 24 hours to date ? ?Thank you for allowing pharmacy to be a part of this patient?s care. ? ?Arty Baumgartner, RPh ?04/23/2021 2:56 PM ? ?

## 2021-04-23 NOTE — Progress Notes (Signed)
?PROGRESS NOTE ? ?[name]  ?DOB: Jun 12, 1965  ?PCP: Mackie Pai, PA-C ?KWI:[id number]  ?DOA: 04/22/2021 ? LOS: 1 day  ?Hospital Day: 2 ? ?Brief narrative: ?[name] is a 56 y.o. female with PMH significant for DM2, HTN, HLD, CAD, cardiomyopathy, CVA, CKD, diabetic gastroparesis, diabetic neuropathy, GERD, depression ?Patient presented to the ED on 3/13 with complaint of left leg cellulitis not responding to a course of oral doxycycline as an outpatient. ?It started as a small swelling and blister that noted on 3/9.  It enlarged and the blister ruptured on 3/11. ? ?In the ED, patient was hemodynamically stable, blood pressure in 90s ?Labs with sodium 131, glucose 310, BUN/creatinine 23/1.59, lactic acid elevated to 2.3 and later worsened to 3.4, unremarkable CBC ?Chest x-ray unremarkable ?Blood culture was sent ?EKG with normal sinus rhythm ?Patient was given IV antibiotics, IV fluid, IV pain medicine ?Admitted to hospitalist service for further evaluation management ? ?Subjective: ?Patient was seen and examined this morning.  Pleasant middle-aged African-American female.  Lying down in bed.  States her pain is not adequately controlled.  Left leg cellulitis area painful even on gentle touch.  There is a central area of fluid-filled blister. ?Chart reviewed ?Hemodynamically stable ?Creatinine improved this morning to 1.15 ? ?Principal Problem: ?  Cellulitis of left leg ?Active Problems: ?  Sepsis due to cellulitis [facility]) ?  Severe sepsis ([place]) ?  Chest pain, unspecified ?  Acute kidney injury superimposed on chronic kidney disease ([place]) ?  Uncontrolled type 2 diabetes mellitus with hyperglycemia, without long-term current use of insulin ([place]) ?  Dyslipidemia ?  ?Assessment and Plan: ?Sepsis -POA ?cellulitis of left leg ?-Presented with worsening pain, swelling and redness of left leg.   ?-Met sepsis criteria with tachypnea, tachycardia, lactic acidosis, elevated creatinine ?-Per history, not  responding to oral doxycycline as an outpatient.   ?-Currently on broad-spectrum IV antibiotics. ?-On my examination, patient seems to have an underlying collection that requires drainage.  Orthopedic consultation called. ?-Pain management: Scheduled Percocet, as needed IV Dilaudid.  I would avoid IV fentanyl on the floor ?-IV hydration ?Recent Labs  ?Lab 04/19/21 ?1611 04/19/21 ?1616 04/22/21 ?1244 04/22/21 ?1447 04/23/21 ?0240  ?WBC 15.1*  --  9.9  --  8.1  ?LATICACIDVEN  --  1.2 2.3* 3.4*  --   ?PROCALCITON  --   --   --   --  0.52  ? ?AKI on CKD2 ?-Creatinine elevated on admission probably because of sepsis.  Improving on IV fluid.  Continue to monitor. ?Recent Labs  ?  08/24/20 ?1000 01/17/21 ?1120 04/03/21 ?1431 04/19/21 ?1611 04/22/21 ?1244 04/23/21 ?0240  ?BUN _0 21* 23* 14  ?CREATININE 0.94 1.23* 1.05 1.54* 1.59* 1.15*  ? ?Uncontrolled type 2 diabetes mellitus with hyperglycemia ?-A1c 7.9 on July 2022. ?-Home meds include Farxiga, metformin ?-Currently on Farxiga.  Metformin on hold. ?Recent Labs  ?Lab 04/22/21 ?2245 04/23/21 ?0759 04/23/21 ?1145  ?GLUCAP 271* 115* 155*  ? ? ?Chest pain, unspecified ?-We will follow serial troponins. ?-Patient will be placed on aspirin as well as as needed sublingual nitroglycerin and morphine sulfate for pain. ?-I discussed it with the cardiology fellow Dr. Alfred Levins who will consult on the patient. ? ? ?Dyslipidemia ?-The patient be continued on statin therapy. ? ? ? ? ? ?Goals of care ?  Code Status: Full Code  ? ? ?Mobility: Encourage ambulation ? ?Nutritional status:  ?Body mass index is 22.92 kg/m?.  ?  ?  ? ? ? ? ?  Diet:  ?Diet Order   ? ?       ?  Diet Carb Modified Fluid consistency: Thin; Room service appropriate? Yes  Diet effective now       ?  ? ?  ?  ? ?  ? ? ?DVT prophylaxis:  ?enoxaparin (LOVENOX) injection 40 mg Start: 04/22/21 2230 ?  ?Antimicrobials: IV cefepime and IV vancomycin ?Fluid: Start NS at 75 mill per hour ?Consultants: Orthopedics ?Family  Communication: None at bedside ? ?Status is: Inpatient ? ?Continue in-hospital care because: Ongoing management of sepsis, may need surgical intervention as well. ?Level of care: Med-Surg  ? ?Dispo: The patient is from: Home ?             Anticipated d/c is to: Pending clinical course ?             Patient currently is not medically stable to d/c. ?  Difficult to place patient No ? ? ? ? ?Infusions:  ? sodium chloride    ? ceFEPime (MAXIPIME) IV 2 g (04/23/21 0908)  ? vancomycin    ? ? ?Scheduled Meds: ? amLODipine  10 mg Oral Daily  ? aspirin EC  81 mg Oral Daily  ? atorvastatin  80 mg Oral Daily  ? carvedilol  25 mg Oral BID WC  ? cholecalciferol  400 Units Oral Daily  ? enoxaparin (LOVENOX) injection  40 mg Subcutaneous Q24H  ? famotidine  20 mg Oral Daily  ? fluticasone  2 spray Each Nare Daily  ? fluticasone furoate-vilanterol  1 puff Inhalation Daily  ? gabapentin  600 mg Oral TID  ? insulin aspart  0-9 Units Subcutaneous TID AC & HS  ? insulin glargine-yfgn  40 Units Subcutaneous QHS  ? meloxicam  7.5 mg Oral Daily  ? oxyCODONE-acetaminophen  1 tablet Oral Q6H  ? sertraline  100 mg Oral Daily  ? topiramate  50 mg Oral BID  ? ? ?PRN meds: ?acetaminophen **OR** acetaminophen, albuterol, busPIRone, HYDROmorphone (DILAUDID) injection, magnesium hydroxide, methocarbamol, nitroGLYCERIN, ondansetron **OR** ondansetron (ZOFRAN) IV, promethazine, SUMAtriptan, traZODone  ? ?Antimicrobials: ?Anti-infectives (From admission, onward)  ? ? Start     Dose/Rate Route Frequency Ordered Stop  ? 04/23/21 1200  vancomycin (VANCOREADY) IVPB 1250 mg/250 mL       ? 1,250 mg ?166.7 mL/hr over 90 Minutes Intravenous Every 24 hours 04/22/21 1342    ? 04/23/21 1000  ceFEPIme (MAXIPIME) 2 g in sodium chloride 0.9 % 100 mL IVPB       ? 2 g ?200 mL/hr over 30 Minutes Intravenous Every 12 hours 04/22/21 2149    ? 04/22/21 2245  metroNIDAZOLE (FLAGYL) tablet 500 mg  Status:  Discontinued       ? 500 mg Oral Every 12 hours 04/22/21 2155  04/22/21 2257  ? 04/22/21 2230  ceFEPIme (MAXIPIME) 2 g in sodium chloride 0.9 % 100 mL IVPB       ? 2 g ?200 mL/hr over 30 Minutes Intravenous  Once 04/22/21 2135 04/22/21 2314  ? 04/22/21 2230  metroNIDAZOLE (FLAGYL) IVPB 500 mg  Status:  Discontinued       ? 500 mg ?100 mL/hr over 60 Minutes Intravenous Every 12 hours 04/22/21 2135 04/22/21 2155  ? 04/22/21 2230  vancomycin (VANCOCIN) IVPB 1000 mg/200 mL premix  Status:  Discontinued       ? 1,000 mg ?200 mL/hr over 60 Minutes Intravenous  Once 04/22/21 2135 04/22/21 2155  ? 04/22/21 1345  vancomycin (VANCOCIN) IVPB 1000 mg/200  mL premix       ? 1,000 mg ?200 mL/hr over 60 Minutes Intravenous  Once 04/22/21 1338 04/22/21 1506  ? ?  ? ? ?Objective: ?Vitals:  ? 04/23/21 0700 04/23/21 0831  ?BP: 99/65   ?Pulse: 84   ?Resp: 16   ?Temp: 98.6 ?F (37 ?C)   ?SpO2: 99% 99%  ? ? ?Intake/Output Summary (Last 24 hours) at 04/23/2021 1318 ?Last data filed at 04/23/2021 0900 ?Gross per 24 hour  ?Intake 2697.62 ml  ?Output --  ?Net 2697.62 ml  ? ?Filed Weights  ? 04/22/21 1219 04/22/21 1253  ?Weight: 76.7 kg 76.7 kg  ? ?Weight change:  ?Body mass index is 22.92 kg/m?.  ? ?Physical Exam: ?General exam: Pleasant, middle-aged African-American female.  In distress from pain ?Skin: No rashes, lesions or ulcers. ?HEENT: Atraumatic, normocephalic, no obvious bleeding ?Lungs: Clear to auscultation bilaterally ?CVS: Regular rate and rhythm, no murmur ?GI/Abd soft, nontender, nondistended, bowel sound present ?CNS: Alert, awake, oriented x3 ?Psychiatry: Sad affect because of pain ?Extremities: No pedal edema, no calf tenderness on the right.  Left leg swollen between the knee and ankle with an area of cellulitis and a pus filled spot anteriorly. ? ?Data Review: I have personally reviewed the laboratory data and studies available. ? ?F/u labs ordered ?Unresulted Labs (From admission, onward)  ? ?  Start     Ordered  ? 04/24/21 0500  CBC with Differential/Platelet  Daily,   R     ?Question:   Specimen collection method  Answer:  Lab=Lab collect  ? 04/23/21 1318  ? 04/24/21 4580  Basic metabolic panel  Daily,   R     ?Question:  Specimen collection method  Answer:  Lab=Lab collect  ? 04/23/21 1318  ? 03

## 2021-04-24 ENCOUNTER — Inpatient Hospital Stay (HOSPITAL_COMMUNITY): Payer: Self-pay | Admitting: Anesthesiology

## 2021-04-24 ENCOUNTER — Encounter (HOSPITAL_COMMUNITY): Payer: Self-pay | Admitting: Internal Medicine

## 2021-04-24 ENCOUNTER — Encounter (HOSPITAL_COMMUNITY)
Admission: EM | Disposition: A | Discharge status: Home / Self Care | Payer: Self-pay | Source: Home / Self Care | Attending: Internal Medicine

## 2021-04-24 DIAGNOSIS — M726 Necrotizing fasciitis: Secondary | ICD-10-CM

## 2021-04-24 DIAGNOSIS — L02416 Cutaneous abscess of left lower limb: Secondary | ICD-10-CM

## 2021-04-24 DIAGNOSIS — I10 Essential (primary) hypertension: Secondary | ICD-10-CM

## 2021-04-24 DIAGNOSIS — E1165 Type 2 diabetes mellitus with hyperglycemia: Secondary | ICD-10-CM

## 2021-04-24 DIAGNOSIS — E119 Type 2 diabetes mellitus without complications: Secondary | ICD-10-CM

## 2021-04-24 DIAGNOSIS — F418 Other specified anxiety disorders: Secondary | ICD-10-CM

## 2021-04-24 HISTORY — PX: I & D EXTREMITY: SHX5045

## 2021-04-24 LAB — GLUCOSE, CAPILLARY
Glucose-Capillary: 201 mg/dL — ABNORMAL HIGH (ref 70–99)
Glucose-Capillary: 132 mg/dL — ABNORMAL HIGH (ref 70–99)
Glucose-Capillary: 52 mg/dL — ABNORMAL LOW (ref 70–99)
Glucose-Capillary: 65 mg/dL — ABNORMAL LOW (ref 70–99)
Glucose-Capillary: 70 mg/dL (ref 70–99)
Glucose-Capillary: 64 mg/dL — ABNORMAL LOW (ref 70–99)
Glucose-Capillary: 124 mg/dL — ABNORMAL HIGH (ref 70–99)
Glucose-Capillary: 153 mg/dL — ABNORMAL HIGH (ref 70–99)
Glucose-Capillary: 102 mg/dL — ABNORMAL HIGH (ref 70–99)
Glucose-Capillary: 147 mg/dL — ABNORMAL HIGH (ref 70–99)

## 2021-04-24 LAB — CBC WITH DIFFERENTIAL/PLATELET
Abs Immature Granulocytes: 0.06 10*3/uL (ref 0.00–0.07)
Basophils Absolute: 0 10*3/uL (ref 0.0–0.1)
Basophils Relative: 0 %
Eosinophils Absolute: 0.3 10*3/uL (ref 0.0–0.5)
Eosinophils Relative: 4 %
HCT: 31.3 % — ABNORMAL LOW (ref 36.0–46.0)
Hemoglobin: 10.2 g/dL — ABNORMAL LOW (ref 12.0–15.0)
Immature Granulocytes: 1 %
Lymphocytes Relative: 27 %
Lymphs Abs: 2.6 10*3/uL (ref 0.7–4.0)
MCH: 29.9 pg (ref 26.0–34.0)
MCHC: 32.6 g/dL (ref 30.0–36.0)
MCV: 91.8 fL (ref 80.0–100.0)
Monocytes Absolute: 1 10*3/uL (ref 0.1–1.0)
Monocytes Relative: 11 %
Neutro Abs: 5.5 10*3/uL (ref 1.7–7.7)
Neutrophils Relative %: 57 %
Platelets: 296 10*3/uL (ref 150–400)
RBC: 3.41 MIL/uL — ABNORMAL LOW (ref 3.87–5.11)
RDW: 15.3 % (ref 11.5–15.5)
WBC: 9.5 10*3/uL (ref 4.0–10.5)
nRBC: 0.2 % (ref 0.0–0.2)

## 2021-04-24 LAB — CULTURE, BLOOD (ROUTINE X 2)
Culture: NO GROWTH
Culture: NO GROWTH
Special Requests: ADEQUATE
Special Requests: ADEQUATE

## 2021-04-24 LAB — BASIC METABOLIC PANEL
Anion gap: 9 (ref 5–15)
BUN: 12 mg/dL (ref 6–20)
CO2: 22 mmol/L (ref 22–32)
Calcium: 8.2 mg/dL — ABNORMAL LOW (ref 8.9–10.3)
Chloride: 108 mmol/L (ref 98–111)
Creatinine, Ser: 1.21 mg/dL — ABNORMAL HIGH (ref 0.44–1.00)
GFR, Estimated: 53 mL/min — ABNORMAL LOW (ref >60)
Glucose, Bld: 128 mg/dL — ABNORMAL HIGH (ref 70–99)
Potassium: 4.2 mmol/L (ref 3.5–5.1)
Sodium: 139 mmol/L (ref 135–145)

## 2021-04-24 SURGERY — IRRIGATION AND DEBRIDEMENT EXTREMITY
Anesthesia: General | Laterality: Left

## 2021-04-24 MED ORDER — DEXTROSE 50 % IV SOLN
INTRAVENOUS | Status: AC
  Administered 2021-04-24: 25 mL
  Filled 2021-04-24: qty 50

## 2021-04-24 MED ORDER — PHENYLEPHRINE 40 MCG/ML (10ML) SYRINGE FOR IV PUSH (FOR BLOOD PRESSURE SUPPORT)
PREFILLED_SYRINGE | INTRAVENOUS | Status: AC
  Filled 2021-04-24: qty 20

## 2021-04-24 MED ORDER — DEXAMETHASONE SODIUM PHOSPHATE 10 MG/ML IJ SOLN
INTRAMUSCULAR | Status: DC | PRN
  Administered 2021-04-24: 4 mg via INTRAVENOUS

## 2021-04-24 MED ORDER — DOCUSATE SODIUM 100 MG PO CAPS
100.0000 mg | ORAL_CAPSULE | Freq: Two times a day (BID) | ORAL | Status: DC
  Administered 2021-04-24 – 2021-04-30 (×12): 100 mg via ORAL
  Filled 2021-04-24 (×14): qty 1

## 2021-04-24 MED ORDER — HYDROMORPHONE HCL 1 MG/ML IJ SOLN
INTRAMUSCULAR | Status: AC
  Filled 2021-04-24: qty 1

## 2021-04-24 MED ORDER — SCOPOLAMINE 1 MG/3DAYS TD PT72: 1.0000 | MEDICATED_PATCH | TRANSDERMAL | Status: DC

## 2021-04-24 MED ORDER — MIDAZOLAM HCL 5 MG/5ML IJ SOLN
INTRAMUSCULAR | Status: DC | PRN
  Administered 2021-04-24: 2 mg via INTRAVENOUS

## 2021-04-24 MED ORDER — INSULIN ASPART 100 UNIT/ML IJ SOLN: 0.0000 [IU] | INTRAMUSCULAR | Status: DC | PRN

## 2021-04-24 MED ORDER — MEPERIDINE HCL 25 MG/ML IJ SOLN: 6.2500 mg | INTRAMUSCULAR | Status: DC | PRN

## 2021-04-24 MED ORDER — ONDANSETRON HCL 4 MG/2ML IJ SOLN
4.0000 mg | Freq: Four times a day (QID) | INTRAMUSCULAR | Status: DC | PRN
  Filled 2021-04-24: qty 2

## 2021-04-24 MED ORDER — LIVING WELL WITH DIABETES BOOK
Freq: Once | Status: AC
  Filled 2021-04-24: qty 1

## 2021-04-24 MED ORDER — HYDROMORPHONE HCL 1 MG/ML IJ SOLN
0.5000 mg | INTRAMUSCULAR | Status: DC | PRN
  Administered 2021-04-24 – 2021-04-26 (×5): 1 mg via INTRAVENOUS
  Filled 2021-04-24 (×5): qty 1

## 2021-04-24 MED ORDER — METHOCARBAMOL 500 MG PO TABS
500.0000 mg | ORAL_TABLET | Freq: Four times a day (QID) | ORAL | Status: DC | PRN
  Administered 2021-04-27 – 2021-05-01 (×8): 500 mg via ORAL
  Filled 2021-04-24 (×8): qty 1

## 2021-04-24 MED ORDER — DEXTROSE 50 % IV SOLN: 25.0000 g | Freq: Once | INTRAVENOUS | Status: AC

## 2021-04-24 MED ORDER — MIDAZOLAM HCL 2 MG/2ML IJ SOLN: 0.5000 mg | Freq: Once | INTRAMUSCULAR | Status: DC | PRN

## 2021-04-24 MED ORDER — 0.9 % SODIUM CHLORIDE (POUR BTL) OPTIME
TOPICAL | Status: DC | PRN
  Administered 2021-04-24: 1000 mL

## 2021-04-24 MED ORDER — HYDROMORPHONE HCL 1 MG/ML IJ SOLN
0.2500 mg | INTRAMUSCULAR | Status: DC | PRN
  Administered 2021-04-24 (×2): 0.25 mg via INTRAVENOUS

## 2021-04-24 MED ORDER — ACETAMINOPHEN 325 MG PO TABS
325.0000 mg | ORAL_TABLET | Freq: Four times a day (QID) | ORAL | Status: DC | PRN
  Administered 2021-04-24: 650 mg via ORAL
  Filled 2021-04-24: qty 2

## 2021-04-24 MED ORDER — OXYCODONE HCL 5 MG PO TABS: 5.0000 mg | ORAL_TABLET | ORAL | Status: DC | PRN

## 2021-04-24 MED ORDER — PHENYLEPHRINE 40 MCG/ML (10ML) SYRINGE FOR IV PUSH (FOR BLOOD PRESSURE SUPPORT)
PREFILLED_SYRINGE | INTRAVENOUS | Status: DC | PRN
  Administered 2021-04-24 (×2): 80 ug via INTRAVENOUS
  Administered 2021-04-24 (×4): 120 ug via INTRAVENOUS

## 2021-04-24 MED ORDER — METOCLOPRAMIDE HCL 5 MG PO TABS: 5.0000 mg | ORAL_TABLET | Freq: Three times a day (TID) | ORAL | Status: DC | PRN

## 2021-04-24 MED ORDER — ONDANSETRON HCL 4 MG/2ML IJ SOLN
INTRAMUSCULAR | Status: DC | PRN
  Administered 2021-04-24: 4 mg via INTRAVENOUS

## 2021-04-24 MED ORDER — METHOCARBAMOL 1000 MG/10ML IJ SOLN
500.0000 mg | Freq: Four times a day (QID) | INTRAVENOUS | Status: DC | PRN
  Filled 2021-04-24: qty 5

## 2021-04-24 MED ORDER — OXYCODONE HCL 5 MG PO TABS
10.0000 mg | ORAL_TABLET | ORAL | Status: DC | PRN
  Administered 2021-04-24 – 2021-04-30 (×9): 15 mg via ORAL
  Filled 2021-04-24 (×9): qty 3

## 2021-04-24 MED ORDER — LACTATED RINGERS IV SOLN: INTRAVENOUS | Status: DC

## 2021-04-24 MED ORDER — SODIUM CHLORIDE 0.9 % IV SOLN: INTRAVENOUS | Status: DC

## 2021-04-24 MED ORDER — INSULIN GLARGINE-YFGN 100 UNIT/ML ~~LOC~~ SOLN
45.0000 [IU] | Freq: Every day | SUBCUTANEOUS | Status: DC
  Filled 2021-04-24: qty 0.45

## 2021-04-24 MED ORDER — DEXTROSE 50 % IV SOLN: 25.0000 mL | Freq: Once | INTRAVENOUS | Status: DC

## 2021-04-24 MED ORDER — EPHEDRINE SULFATE-NACL 50-0.9 MG/10ML-% IV SOSY
PREFILLED_SYRINGE | INTRAVENOUS | Status: DC | PRN
  Administered 2021-04-24: 10 mg via INTRAVENOUS
  Administered 2021-04-24: 5 mg via INTRAVENOUS
  Administered 2021-04-24: 10 mg via INTRAVENOUS

## 2021-04-24 MED ORDER — FENTANYL CITRATE (PF) 100 MCG/2ML IJ SOLN
INTRAMUSCULAR | Status: DC | PRN
Start: 2021-04-24 — End: 2021-04-24
  Administered 2021-04-24: 100 ug via INTRAVENOUS

## 2021-04-24 MED ORDER — OXYCODONE HCL 5 MG PO TABS: 5.0000 mg | ORAL_TABLET | Freq: Once | ORAL | Status: DC | PRN

## 2021-04-24 MED ORDER — DEXTROSE-NACL 5-0.9 % IV SOLN: INTRAVENOUS | Status: DC

## 2021-04-24 MED ORDER — METOCLOPRAMIDE HCL 5 MG/ML IJ SOLN: 5.0000 mg | Freq: Three times a day (TID) | INTRAMUSCULAR | Status: DC | PRN

## 2021-04-24 MED ORDER — PROPOFOL 10 MG/ML IV BOLUS
INTRAVENOUS | Status: DC | PRN
  Administered 2021-04-24: 150 mg via INTRAVENOUS

## 2021-04-24 MED ORDER — BISACODYL 10 MG RE SUPP: 10.0000 mg | Freq: Every day | RECTAL | Status: DC | PRN

## 2021-04-24 MED ORDER — MIDAZOLAM HCL 2 MG/2ML IJ SOLN
INTRAMUSCULAR | Status: AC
  Filled 2021-04-24: qty 2

## 2021-04-24 MED ORDER — FENTANYL CITRATE (PF) 250 MCG/5ML IJ SOLN
INTRAMUSCULAR | Status: AC
  Filled 2021-04-24: qty 5

## 2021-04-24 MED ORDER — DEXTROSE 50 % IV SOLN
INTRAVENOUS | Status: AC
  Filled 2021-04-24: qty 50

## 2021-04-24 MED ORDER — CHLORHEXIDINE GLUCONATE 0.12 % MT SOLN
15.0000 mL | Freq: Once | OROMUCOSAL | Status: AC
  Administered 2021-04-24: 15 mL via OROMUCOSAL
  Filled 2021-04-24: qty 15

## 2021-04-24 MED ORDER — ORAL CARE MOUTH RINSE: 15.0000 mL | Freq: Once | OROMUCOSAL | Status: AC

## 2021-04-24 MED ORDER — OXYCODONE HCL 5 MG/5ML PO SOLN: 5.0000 mg | Freq: Once | ORAL | Status: DC | PRN

## 2021-04-24 MED ORDER — ONDANSETRON HCL 4 MG PO TABS
4.0000 mg | ORAL_TABLET | Freq: Four times a day (QID) | ORAL | Status: DC | PRN
  Administered 2021-04-24: 4 mg via ORAL
  Filled 2021-04-24: qty 1

## 2021-04-24 MED ORDER — EPHEDRINE 5 MG/ML INJ
INTRAVENOUS | Status: AC
  Filled 2021-04-24: qty 5

## 2021-04-24 MED ORDER — POLYETHYLENE GLYCOL 3350 17 G PO PACK
17.0000 g | PACK | Freq: Every day | ORAL | Status: DC | PRN
  Filled 2021-04-24: qty 1

## 2021-04-24 MED ORDER — INSULIN GLARGINE-YFGN 100 UNIT/ML ~~LOC~~ SOLN
20.0000 [IU] | Freq: Every day | SUBCUTANEOUS | Status: DC
  Administered 2021-04-24: 20 [IU] via SUBCUTANEOUS
  Filled 2021-04-24 (×3): qty 0.2

## 2021-04-24 SURGICAL SUPPLY — 30 items
BAG COUNTER SPONGE SURGICOUNT (BAG) IMPLANT
BLADE SURG 21 STRL SS (BLADE) ×2 IMPLANT
BNDG COHESIVE 6X5 TAN STRL LF (GAUZE/BANDAGES/DRESSINGS) IMPLANT
BNDG GAUZE ELAST 4 BULKY (GAUZE/BANDAGES/DRESSINGS) ×4 IMPLANT
COVER SURGICAL LIGHT HANDLE (MISCELLANEOUS) ×4 IMPLANT
DRAPE U-SHAPE 47X51 STRL (DRAPES) ×2 IMPLANT
DRSG ADAPTIC 3X8 NADH LF (GAUZE/BANDAGES/DRESSINGS) ×2 IMPLANT
DURAPREP 26ML APPLICATOR (WOUND CARE) ×2 IMPLANT
ELECT REM PT RETURN 9FT ADLT (ELECTROSURGICAL)
ELECTRODE REM PT RTRN 9FT ADLT (ELECTROSURGICAL) IMPLANT
GAUZE SPONGE 4X4 12PLY STRL (GAUZE/BANDAGES/DRESSINGS) ×2 IMPLANT
GLOVE SURG ORTHO LTX SZ9 (GLOVE) ×2 IMPLANT
GLOVE SURG UNDER POLY LF SZ9 (GLOVE) ×2 IMPLANT
GOWN STRL REUS W/ TWL XL LVL3 (GOWN DISPOSABLE) ×2 IMPLANT
GOWN STRL REUS W/TWL XL LVL3 (GOWN DISPOSABLE) ×4
HANDPIECE INTERPULSE COAX TIP (DISPOSABLE)
KIT BASIN OR (CUSTOM PROCEDURE TRAY) ×2 IMPLANT
KIT TURNOVER KIT B (KITS) ×2 IMPLANT
MANIFOLD NEPTUNE II (INSTRUMENTS) ×2 IMPLANT
NS IRRIG 1000ML POUR BTL (IV SOLUTION) ×2 IMPLANT
PACK ORTHO EXTREMITY (CUSTOM PROCEDURE TRAY) ×2 IMPLANT
PAD ARMBOARD 7.5X6 YLW CONV (MISCELLANEOUS) ×4 IMPLANT
SET HNDPC FAN SPRY TIP SCT (DISPOSABLE) IMPLANT
STOCKINETTE IMPERVIOUS 9X36 MD (GAUZE/BANDAGES/DRESSINGS) IMPLANT
SUT ETHILON 2 0 PSLX (SUTURE) ×2 IMPLANT
SWAB COLLECTION DEVICE MRSA (MISCELLANEOUS) ×2 IMPLANT
SWAB CULTURE ESWAB REG 1ML (MISCELLANEOUS) IMPLANT
TOWEL GREEN STERILE (TOWEL DISPOSABLE) ×2 IMPLANT
TUBE CONNECTING 12X1/4 (SUCTIONS) ×2 IMPLANT
YANKAUER SUCT BULB TIP NO VENT (SUCTIONS) ×2 IMPLANT

## 2021-04-24 NOTE — Progress Notes (Incomplete)
Hypoglycemic Event ? ?CBG: 64 ? ?Treatment: D50 25 mL (12.5 gm) ? ?Symptoms: None ? ?Follow-up CBG: Time:1620 CBG Result:*** ? ?Possible Reasons for Event: {Possible Reasons for Event:[id number]} ? ?Comments/MD notified:Sam CRNA to repeat CBG in OR ? ? ? ?[name] [name] ? ? ?

## 2021-04-24 NOTE — Transfer of Care (Signed)
Immediate Anesthesia Transfer of Care Note ? ?Patient: ZIZA HASTINGS ? ?Procedure(s) Performed: LEFT LEG DEBRIDEMENT (Left) ? ?Patient Location: PACU ? ?Anesthesia Type:General ? ?Level of Consciousness: awake, alert  and oriented ? ?Airway & Oxygen Therapy: Patient Spontanous Breathing and Patient connected to face mask oxygen ? ?Post-op Assessment: Report given to RN and Post -op Vital signs reviewed and stable ? ?Post vital signs: Reviewed and stable ? ?Last Vitals:  ?Vitals Value Taken Time  ?BP 118/77 04/24/21 1657  ?Temp    ?Pulse 91 04/24/21 1659  ?Resp 9 04/24/21 1659  ?SpO2 98 % 04/24/21 1659  ?Vitals shown include unvalidated device data. ? ?Last Pain:  ?Vitals:  ? 04/24/21 1451  ?TempSrc:   ?PainSc: 4   ?   ? ?Patients Stated Pain Goal: 0 (04/22/21 2228) ? ?Complications: No notable events documented. ?

## 2021-04-24 NOTE — Progress Notes (Signed)
?PROGRESS NOTE ? ?[name]  ?DOB: 20-Aug-1965  ?PCP: Mackie Pai, PA-C ?TSV:[id number]  ?DOA: 04/22/2021 ? LOS: 2 days  ?Hospital Day: 3 ? ?Brief narrative: ?[name] is a 56 y.o. female with PMH significant for DM2, HTN, HLD, CAD, cardiomyopathy, CVA, CKD, diabetic gastroparesis, diabetic neuropathy, GERD, depression ?Patient presented to the ED on 3/13 with complaint of left leg cellulitis not responding to a course of oral doxycycline as an outpatient. ?It started as a small swelling and blister that noted on 3/9.  It enlarged and the blister ruptured on 3/11. ? ?In the ED, patient was hemodynamically stable, blood pressure in 90s ?Labs with sodium 131, glucose 310, BUN/creatinine 23/1.59, lactic acid elevated to 2.3 and later worsened to 3.4, unremarkable CBC ?Chest x-ray unremarkable ?Blood culture was sent ?EKG with normal sinus rhythm ?Patient was given IV antibiotics, IV fluid, IV pain medicine ?Admitted to hospitalist service for further evaluation management ? ?Subjective: ?Patient was seen and examined this morning.  ?Lying down in bed.  Not in distress.  Pain controlled.  Pending I&D of the abscess this afternoon. ? ?Principal Problem: ?  Cellulitis of left leg ?Active Problems: ?  Sepsis due to cellulitis [facility]) ?  Severe sepsis ([place]) ?  Chest pain, unspecified ?  Acute kidney injury superimposed on chronic kidney disease ([place]) ?  Uncontrolled type 2 diabetes mellitus with hyperglycemia, without long-term current use of insulin ([place]) ?  Dyslipidemia ?  Abscess of left lower leg ?  ?Assessment and Plan: ?Sepsis -POA ?cellulitis of left leg ?-Presented with worsening pain, swelling and redness of left leg.   ?-Met sepsis criteria with tachypnea, tachycardia, lactic acidosis, elevated creatinine ?-Per history, not responding to oral doxycycline as an outpatient.   ?-Currently on broad-spectrum IV antibiotics. ?-Orthopedics consultation appreciated.  Suspect abscess underneath.  I&D  planned for this afternoon. ?-Continue with IV hydration. ?-Pain management: Scheduled Percocet, as needed IV Dilaudid.  ?Recent Labs  ?Lab 04/19/21 ?1611 04/19/21 ?1616 04/22/21 ?1244 04/22/21 ?1447 04/23/21 ?9233 04/24/21 ?0208  ?WBC 15.1*  --  9.9  --  8.1 9.5  ?LATICACIDVEN  --  1.2 2.3* 3.4*  --   --   ?PROCALCITON  --   --   --   --  0.52  --   ? ?AKI on CKD2 ?-Creatinine elevated on admission probably because of sepsis.  Improving on IV fluid.  Continue to monitor. ?Recent Labs  ?  08/24/20 ?1000 01/17/21 ?1120 04/03/21 ?1431 04/19/21 ?1611 04/22/21 ?1244 04/23/21 ?0076 04/24/21 ?0208  ?BUN 19 20 19  21* 23* 14 12  ?CREATININE 0.94 1.23* 1.05 1.54* 1.59* 1.15* 1.21*  ? ?Uncontrolled type 2 diabetes mellitus ?hyperglycemia and hypoglycemia ?-Patient had a significant worsening of A1c from 7.9 on July 2022 to 12.3 on 04/13/2021. ?-Home meds include Tresiba 40 units daily, Farxiga, metformin ?-Currently on Semglee 40 units nightly, Farxiga.  Metformin on hold. ?-This morning, patient had significant drop in blood sugar level from 201 to 52 in a span of 3 hours.  This is probably because she is n.p.o. she received 40 units of Lantus last night.  For now I will switch her from NS to D5 NS for the duration of surgery. ?Recent Labs  ?Lab 04/23/21 ?1628 04/23/21 ?1949 04/24/21 ?0736 04/24/21 ?1052 04/24/21 ?1114  ?GLUCAP 89 94 201* 52* 132*  ? ?Chest pain, unspecified ?-She complained of chest pain on the day of admission.  Troponin was negative.  Echocardiogram with EF 60 to 65%, no wall motion  abnormality.  No suspicion of ischemic etiology at this time. ?Recent Labs  ?  04/22/21 ?1447 04/22/21 ?2246 04/23/21 ?0240  ?TROPONINIHS 5 6 8   ? ?Dyslipidemia ?-Continue statin ? ?Goals of care ?  Code Status: Full Code  ? ? ?Mobility: Encourage ambulation ? ?Nutritional status:  ?Body mass index is 22.92 kg/m?.  ?  ?  ? ? ? ? ?Diet:  ?Diet Order   ? ?       ?  Diet NPO time specified  Diet effective ____       ?  ? ?  ?  ? ?   ? ? ?DVT prophylaxis:  ?enoxaparin (LOVENOX) injection 40 mg Start: 04/22/21 2230 ?  ?Antimicrobials: IV cefepime and IV vancomycin ?Fluid: Switch fluid to D5 NS at 50 mill per hour. ?Consultants: Orthopedics ?Family Communication: None at bedside ? ?Status is: Inpatient ? ?Continue in-hospital care because: Pending I&D today. ?Level of care: Med-Surg  ? ?Dispo: The patient is from: Home ?             Anticipated d/c is to: Pending clinical course ?             Patient currently is not medically stable to d/c. ?  Difficult to place patient No ? ? ? ? ?Infusions:  ?  ceFAZolin (ANCEF) IV    ? ceFEPime (MAXIPIME) IV 2 g (04/24/21 1048)  ? dextrose 5 % and 0.9% NaCl    ? vancomycin    ? ? ?Scheduled Meds: ? amLODipine  10 mg Oral Daily  ? aspirin EC  81 mg Oral Daily  ? atorvastatin  80 mg Oral Daily  ? carvedilol  25 mg Oral BID WC  ? chlorhexidine  60 mL Topical Once  ? cholecalciferol  400 Units Oral Daily  ? enoxaparin (LOVENOX) injection  40 mg Subcutaneous Q24H  ? famotidine  20 mg Oral Daily  ? fluticasone  2 spray Each Nare Daily  ? fluticasone furoate-vilanterol  1 puff Inhalation Daily  ? gabapentin  600 mg Oral TID  ? insulin aspart  0-9 Units Subcutaneous TID AC & HS  ? insulin glargine-yfgn  45 Units Subcutaneous QHS  ? living well with diabetes book   Does not apply Once  ? meloxicam  7.5 mg Oral Daily  ? oxyCODONE-acetaminophen  1 tablet Oral Q6H  ? povidone-iodine  2 application. Topical Once  ? sertraline  100 mg Oral Daily  ? topiramate  50 mg Oral BID  ? ? ?PRN meds: ?acetaminophen **OR** acetaminophen, albuterol, busPIRone, HYDROmorphone (DILAUDID) injection, magnesium hydroxide, methocarbamol, nitroGLYCERIN, ondansetron **OR** ondansetron (ZOFRAN) IV, promethazine, SUMAtriptan, traZODone  ? ?Antimicrobials: ?Anti-infectives (From admission, onward)  ? ? Start     Dose/Rate Route Frequency Ordered Stop  ? 04/24/21 1200  vancomycin (VANCOREADY) IVPB 1500 mg/300 mL       ? 1,500 mg ?150 mL/hr over  120 Minutes Intravenous Every 24 hours 04/23/21 1455    ? 04/24/21 0600  ceFAZolin (ANCEF) IVPB 2g/100 mL premix       ? 2 g ?200 mL/hr over 30 Minutes Intravenous On call to O.R. 04/23/21 1928 04/25/21 0559  ? 04/23/21 1200  vancomycin (VANCOREADY) IVPB 1250 mg/250 mL  Status:  Discontinued       ? 1,250 mg ?166.7 mL/hr over 90 Minutes Intravenous Every 24 hours 04/22/21 1342 04/23/21 1455  ? 04/23/21 1000  ceFEPIme (MAXIPIME) 2 g in sodium chloride 0.9 % 100 mL IVPB       ?  2 g ?200 mL/hr over 30 Minutes Intravenous Every 12 hours 04/22/21 2149    ? 04/22/21 2245  metroNIDAZOLE (FLAGYL) tablet 500 mg  Status:  Discontinued       ? 500 mg Oral Every 12 hours 04/22/21 2155 04/22/21 2257  ? 04/22/21 2230  ceFEPIme (MAXIPIME) 2 g in sodium chloride 0.9 % 100 mL IVPB       ? 2 g ?200 mL/hr over 30 Minutes Intravenous  Once 04/22/21 2135 04/22/21 2314  ? 04/22/21 2230  metroNIDAZOLE (FLAGYL) IVPB 500 mg  Status:  Discontinued       ? 500 mg ?100 mL/hr over 60 Minutes Intravenous Every 12 hours 04/22/21 2135 04/22/21 2155  ? 04/22/21 2230  vancomycin (VANCOCIN) IVPB 1000 mg/200 mL premix  Status:  Discontinued       ? 1,000 mg ?200 mL/hr over 60 Minutes Intravenous  Once 04/22/21 2135 04/22/21 2155  ? 04/22/21 1345  vancomycin (VANCOCIN) IVPB 1000 mg/200 mL premix       ? 1,000 mg ?200 mL/hr over 60 Minutes Intravenous  Once 04/22/21 1338 04/22/21 1506  ? ?  ? ? ?Objective: ?Vitals:  ? 04/23/21 2012 04/24/21 0738  ?BP: 117/74 (!) 92/58  ?Pulse: 85 84  ?Resp: 20 16  ?Temp: 98.9 ?F (37.2 ?C) 98.4 ?F (36.9 ?C)  ?SpO2: 91% 96%  ? ?No intake or output data in the 24 hours ending 04/24/21 1121 ? ?Filed Weights  ? 04/22/21 1219 04/22/21 1253  ?Weight: 76.7 kg 76.7 kg  ? ?Weight change:  ?Body mass index is 22.92 kg/m?.  ? ?Physical Exam: ?General exam: Pleasant, middle-aged African-American female.  Pain controlled ?Skin: No rashes, lesions or ulcers. ?HEENT: Atraumatic, normocephalic, no obvious bleeding ?Lungs: Clear to  auscultation bilaterally ?CVS: Regular rate and rhythm, no murmur ?GI/Abd soft, nontender, nondistended, bowel sound present ?CNS: Alert, awake, oriented x3 ?Psychiatry: Sad affect because of pain ?Extremities:

## 2021-04-24 NOTE — Plan of Care (Signed)

## 2021-04-24 NOTE — Progress Notes (Signed)
Pt was found sweaty and lethargic.  POC Blood sugar at 52.  See standing hypoglycemic orders.  1 amp of D50 adminstered ?

## 2021-04-24 NOTE — Op Note (Signed)
04/24/2021 ? ?5:01 PM ? ?PATIENT:  [name]   ? ?PRE-OPERATIVE DIAGNOSIS:  Abscess Left Leg ? ?POST-OPERATIVE DIAGNOSIS:  Same ? ?PROCEDURE:  LEFT LEG EXCISIONAL DEBRIDEMENT ?Local tissue rearrangement for wound closure 11 x 4 cm. ?Application of 13 cm Prevena wound VAC. ?Tissue sent for cultures. ? ?SURGEON:  [name], MD ? ?PHYSICIAN ASSISTANT:None ?ANESTHESIA:   General ? ?PREOPERATIVE INDICATIONS:  [name] is a  56 y.o. female with a diagnosis of Abscess Left Leg who failed conservative measures and elected for surgical management.   ? ?The risks benefits and alternatives were discussed with the patient preoperatively including but not limited to the risks of infection, bleeding, nerve injury, cardiopulmonary complications, the need for revision surgery, among others, and the patient was willing to proceed. ? ?OPERATIVE IMPLANTS: 13 cm Prevena wound VAC. ? ?'@ENCIMAGES'$ @ ? ?OPERATIVE FINDINGS: There was thickened necrotic fascia with gas in the soft tissue that seemed consistent with necrotizing fasciitis. ? ?The soft tissue was sent for cultures. ? ?OPERATIVE PROCEDURE: Patient brought the operating room and underwent a general anesthetic.  After adequate levels anesthesia were obtained patient's left lower extremity was prepped using DuraPrep draped into a sterile field a timeout was called.  Elliptical incision was made around the ulcerative tissue.  This left a wound that was 11 x 4 cm.  There was gas in the soft tissue.  There was necrotic soft tissue and thickened necrotic fascia.  Fascia muscle soft tissue and skin was excised.  The tissue margins were clear this was irrigated with normal saline.  Local tissue rearrangement was used to close the wound 11 x 4 cm.  Using 2-0 nylon.  A 13 cm Prevena wound VAC was applied this had a good suction fit.  This was overwrapped with Coban patient was extubated taken the PACU in stable condition. ? ?Debridement type: Excisional  Debridement ? ?Side: left ? ?Body Location: lower leg  ? ?Tools used for debridement: scalpel and rongeur ? ?Pre-debridement Wound size (cm):   Length: 1        Width: 1     Depth: 1  ? ?Post-debridement Wound size (cm):   Length: 11        Width: 4     Depth: 2  ? ?Debridement depth beyond dead/damaged tissue down to healthy viable tissue: yes ? ?Tissue layer involved: skin, subcutaneous tissue, muscle / fascia ? ?Nature of tissue removed: Slough, Necrotic, Devitalized Tissue, Non-viable tissue, and Purulence ? ?Irrigation volume: 1 liter    ? ?Irrigation fluid type: Normal Saline ? ? ? ? ?DISCHARGE PLANNING: ? ?Antibiotic duration: Continue IV antibiotics adjust according to tissue cultures. ? ?Weightbearing: Weightbearing as tolerated ? ?Pain medication: Opioid pathway ? ?Dressing care/ Wound VAC: Wound VAC continue for 1 week ? ?Ambulatory devices: As needed ? ?Discharge to: Anticipate discharge to home after cultures finalized. ? ?Follow-up: In the office 1 week post operative. ? ? ? ? ? ? ?  ?

## 2021-04-24 NOTE — Consult Note (Signed)
ORTHOPAEDIC CONSULTATION  REQUESTING PHYSICIAN: Lorin Glass, MD  Chief Complaint: Pain swelling and drainage left leg.  HPI: [name] is a 56 y.o. female who presents with acute cellulitis and abscess left leg.  Patient states she noticed pain at night and developed a ulcer with drainage.  Patient denies any trauma.  Patient has a history of uncontrolled type 2 diabetes.  Past Medical History:  Diagnosis Date   ABDOMINAL PAIN-EPIGASTRIC 09/14/2009   Qualifier: Diagnosis of  By: Monica Becton PA-c, [name]    Acute cystitis 07/07/2017   ANKLE PAIN, LEFT 12/25/2009   Qualifier: Diagnosis of  By: Delrae Alfred MD, [name]     Anxiety 01/22/2016   CAD (coronary artery disease) 07/07/2017   Cardiac murmur 02/01/2019   Cardiomyopathy, secondary (HCC) 09/06/2009   Qualifier: Diagnosis of  By: Jolene Provost   Formatting of this note might be different from the original. Overview:  Qualifier: Diagnosis of  By: Jolene Provost   Cardiomyopathy, unspecified (HCC) 09/06/2009   Formatting of this note might be different from the original. Overview:  Overview:  Qualifier: Diagnosis of  By: Jolene Provost  Overview:  Overview:  Qualifier: Diagnosis of  By: Jolene Provost Formatting of this note might be different from the original. Overview:  Qualifier: Diagnosis of  By: Jolene Provost   Cerebrovascular disease 09/13/2012   Cervical cancer (HCC)    had surgery in 2001.   CHEST PAIN, ATYPICAL 07/30/2009   Qualifier: Diagnosis of  By: Delrae Alfred MD, Beverely Low of this note might be different from the original. Overview:  Qualifier: Diagnosis of  By: Delrae Alfred MD, [name]   Chronic back pain    Chronic kidney disease, stage III (moderate) (HCC) 05/23/2014   Chronic pain syndrome 01/23/2016   Coronary artery disease    30% lesions noted   Current use of insulin (HCC) 05/23/2014   Depression    Diabetes mellitus     DIABETES MELLITUS, TYPE II, UNCONTROLLED 07/30/2009   Qualifier: Diagnosis of  By: Delrae Alfred MD, [name]     Diabetes type 2, uncontrolled 05/23/2014   Diabetic gastroparesis associated with type 1 diabetes mellitus (HCC) 11/27/2016   DIABETIC PERIPHERAL NEUROPATHY 09/21/2009   Qualifier: Diagnosis of  By: Delrae Alfred MD, [name]     Diabetic peripheral neuropathy (HCC) 01/23/2016   Dysuria 12/25/2009   Qualifier: Diagnosis of  By: Delrae Alfred MD, [name]     Elevation of level of transaminase or lactic acid dehydrogenase (LDH) 09/21/2009   Formatting of this note might be different from the original. Overview:  Qualifier: Diagnosis of  By: Delrae Alfred MD, [name]   Essential hypertension 08/30/2009   Qualifier: Diagnosis of  By: Cristela Felt, CNA, Christy     Gastric atony 07/30/2009   Formatting of this note might be different from the original. Overview:  Qualifier: Diagnosis of  By: Delrae Alfred MD, [name]   Gastroesophageal reflux disease 07/30/2009   Formatting of this note might be different from the original. Overview:  Overview:  Qualifier: Diagnosis of  By: Delrae Alfred MD, Alfonso Patten: Diagnosis of  By: Wilmon Pali NP, Consuela Mimes of this note might be different from the original. Overview:  Qualifier: Diagnosis of  By: Delrae Alfred MD, Alfonso Patten: Diagnosis of  By: Wilmon Pali NP, Gunnar Fusi   Gastroparesis 07/30/2009   Qualifier: Diagnosis of  By: Delrae Alfred MD, [name]     GERD 07/30/2009   Qualifier: Diagnosis of  By: Delrae Alfred  MD, Alfonso Patten: Diagnosis of  By: Wilmon Pali NP, Gunnar Fusi     GERD (gastroesophageal reflux disease)    History of cervical cancer 08/17/2015   Status post partial hysterectomy  Formatting of this note might be different from the original. Status post partial hysterectomy   HLD (hyperlipidemia) 07/30/2009   Qualifier: Diagnosis of  By: Delrae Alfred MD, [name]     Hyperlipidemia    Hyperlipidemia, unspecified 07/30/2009   Formatting of this note might be different  from the original. Overview:  Qualifier: Diagnosis of  By: Delrae Alfred MD, Beverely Low of this note might be different from the original. Overview:  Qualifier: Diagnosis of  By: Delrae Alfred MD, [name]   Hypertension    INSOMNIA 09/21/2009   Qualifier: Diagnosis of  By: Delrae Alfred MD, [name]     Lumbar facet arthropathy 05/14/2016   Lumbar spinal stenosis 02/11/2018   Metatarsalgia of both feet 03/11/2017   Migraine 09/13/2012   Overview:  IMPRESSION: possible abd migraine   Mixed dyslipidemia 02/01/2019   Nausea & vomiting 07/07/2017   Nausea with vomiting, unspecified 09/14/2009   Qualifier: Diagnosis of  By: Myrtie Hawk, [name]   Formatting of this note might be different from the original. Overview:  Qualifier: Diagnosis of  By: Myrtie Hawk, [name]   Neuropathy    Nonspecific abnormal findings on radiological and examination of skull and head 09/13/2012   Overweight (BMI 25.0-29.9) 07/21/2014   POSITIVE PPD 07/30/2009   Annotation: CXR clear ?  diagnosed in 2/11?  On INH/pyridoxine Qualifier: Diagnosis of  By: Delrae Alfred MD, [name]     Pure hypercholesterolemia 11/27/2016   Retention of urine 06/25/2011   Right flank pain    TOBACCO ABUSE 09/21/2009   Qualifier: Diagnosis of  By: Delrae Alfred MD, Beverely Low of this note might be different from the original. Overview:  Qualifier: Diagnosis of  By: Delrae Alfred MD, Claybon Jabs, SERUM, ELEVATED 09/21/2009   Qualifier: Diagnosis of  By: Delrae Alfred MD, [name]     Trigeminal neuralgia    Tuberculin test reaction 07/30/2009   Formatting of this note might be different from the original. Overview:  Annotation: CXR clear ?  diagnosed in 2/11?  On INH/pyridoxine Qualifier: Diagnosis of  By: Delrae Alfred MD, [name]   Type 2 diabetes mellitus with hyperglycemia (HCC) 07/30/2009   Qualifier: Diagnosis of  By: Delrae Alfred MD, Beverely Low of this note might be different from the original. Overview:  Qualifier: Diagnosis of  By:  Delrae Alfred MD, [name]   Type 2 diabetes mellitus with hyperglycemia, with long-term current use of insulin (HCC) 07/30/2009   Formatting of this note might be different from the original. Overview:  05/30/2015 A1C was 16.7% (!)  Overview:  05/30/2015 A1C was 16.7% (!) Formatting of this note might be different from the original. 05/30/2015 A1C was 16.7% (!)   Type 2 diabetes mellitus with stage 3 chronic kidney disease (HCC) 09/14/2009   Qualifier: Diagnosis of  By: Myrtie Hawk, [name]    ULCER-GASTRIC 09/28/2009   Qualifier: Diagnosis of  By: Wilmon Pali NP, Paula     URINARY TRACT INFECTION 09/21/2009   Qualifier: Diagnosis of  By: Delrae Alfred MD, Adele Dan, CANDIDAL 12/25/2009   Qualifier: Diagnosis of  By: Delrae Alfred MD, Hubbard Robinson D deficiency 04/25/2013   Past Surgical History:  Procedure Laterality Date   ABDOMINAL HYSTERECTOMY     partial   CHOLECYSTECTOMY     Social History  Socioeconomic History   Marital status: Single    Spouse name: Not on file   Number of children: Not on file   Years of education: Not on file   Highest education level: Not on file  Occupational History   Not on file  Tobacco Use   Smoking status: Never   Smokeless tobacco: Never  Vaping Use   Vaping Use: Never used  Substance and Sexual Activity   Alcohol use: Yes    Comment: occ   Drug use: No   Sexual activity: Not on file  Other Topics Concern   Not on file  Social History Narrative   Not on file   Social Determinants of Health   Financial Resource Strain: Not on file  Food Insecurity: Not on file  Transportation Needs: Not on file  Physical Activity: Not on file  Stress: Not on file  Social Connections: Not on file   Family History  Problem Relation Age of Onset   Diabetes Mother    Diabetes Father    - negative except otherwise stated in the family history section Allergies  Allergen Reactions   Insulin Aspart Other (See Comments) and Swelling    adverse  reaction to Novolog    Latex Itching and Rash   Morphine Itching and Rash   Prior to Admission medications   Medication Sig Start Date End Date Taking? Authorizing Provider  albuterol (VENTOLIN HFA) 108 (90 Base) MCG/ACT inhaler Inhale 2 puffs into the lungs every 6 (six) hours as needed for wheezing or shortness of breath. 08/03/20  Yes Saguier, Ramon Dredge, PA-C  amLODipine (NORVASC) 10 MG tablet Take 1 tablet by mouth once daily Patient taking differently: Take 10 mg by mouth daily. 02/05/21  Yes Saguier, Ramon Dredge, PA-C  aspirin 81 MG tablet Take 81 mg by mouth daily.   Yes [provider]  atorvastatin (LIPITOR) 80 MG tablet Take 1 tablet by mouth once daily Patient taking differently: Take 80 mg by mouth daily. 08/06/20  Yes Saguier, Ramon Dredge, PA-C  azelastine (ASTELIN) 0.1 % nasal spray Place 2 sprays into both nostrils 2 (two) times daily. 04/09/20  Yes Paz, Nolon Rod, MD  busPIRone (BUSPAR) 15 MG tablet Take 1 tablet (15 mg total) by mouth 2 (two) times daily. 10/30/20  Yes Saguier, Ramon Dredge, PA-C  carvedilol (COREG) 25 MG tablet Take 1 tablet (25 mg total) by mouth 2 (two) times daily with a meal. 12/17/20  Yes Saguier, Ramon Dredge, PA-C  Cholecalciferol (VITAMIN D3) 10 MCG (400 UNIT) CAPS Take 400 mg by mouth daily. 12/17/20  Yes Saguier, Ramon Dredge, PA-C  doxycycline (VIBRAMYCIN) 100 MG capsule Take 1 capsule (100 mg total) by mouth 2 (two) times daily. 04/19/21  Yes Henderly, Britni A, PA-C  famotidine (PEPCID) 20 MG tablet Take 1 tablet (20 mg total) by mouth daily. 04/03/21  Yes Saguier, Ramon Dredge, PA-C  fluticasone (FLONASE) 50 MCG/ACT nasal spray Place 2 sprays into both nostrils daily. 09/27/19  Yes Saguier, Ramon Dredge, PA-C  fluticasone furoate-vilanterol (BREO ELLIPTA) 100-25 MCG/INH AEPB Inhale 1 puff into the lungs daily. 08/03/20  Yes Saguier, Ramon Dredge, PA-C  gabapentin (NEURONTIN) 300 MG capsule Take 2 capsules (600 mg total) by mouth 3 (three) times daily. 12/17/20  Yes Saguier, Ramon Dredge, PA-C  insulin  degludec (TRESIBA) 100 UNIT/ML FlexTouch Pen Inject 40 Units into the skin daily at 10 pm. Patient taking differently: Inject 40 Units into the skin every evening. 11/16/20  Yes Saguier, Ramon Dredge, PA-C  meloxicam (MOBIC) 7.5 MG tablet Take 1-2 tablets (7.5-15 mg  total) by mouth daily. 03/21/21  Yes Saguier, Ramon Dredge, PA-C  metFORMIN (GLUCOPHAGE-XR) 500 MG 24 hr tablet Take 1 tablet (500 mg total) by mouth daily with breakfast. 01/15/21  Yes Saguier, Ramon Dredge, PA-C  methocarbamol (ROBAXIN) 750 MG tablet Take 1 tablet (750 mg total) by mouth every 8 (eight) hours as needed for muscle spasms. 04/01/21  Yes Saguier, Ramon Dredge, PA-C  nitroGLYCERIN (NITROSTAT) 0.4 MG SL tablet Place 1 tablet (0.4 mg total) under the tongue every 5 (five) minutes as needed for chest pain. 03/21/21  Yes Saguier, Ramon Dredge, PA-C  oxyCODONE (ROXICODONE) 5 MG immediate release tablet Take 1 tablet (5 mg total) by mouth every 6 (six) hours as needed for severe pain. 03/21/21  Yes Saguier, Ramon Dredge, PA-C  promethazine (PHENERGAN) 12.5 MG tablet Take 1 tablet (12.5 mg total) by mouth every 8 (eight) hours as needed. 12/17/20  Yes Saguier, Ramon Dredge, PA-C  rizatriptan (MAXALT-MLT) 10 MG disintegrating tablet Take 10 mg by mouth daily as needed for migraine. 04/17/20  Yes [provider]  sertraline (ZOLOFT) 100 MG tablet Take 1 tablet (100 mg total) by mouth daily. 08/03/20  Yes Saguier, Ramon Dredge, PA-C  spironolactone (ALDACTONE) 25 MG tablet Take 1 tablet by mouth once daily Patient taking differently: Take 25 mg by mouth daily. 07/21/19  Yes Saguier, Ramon Dredge, PA-C  topiramate (TOPAMAX) 50 MG tablet Take 1 tablet (50 mg total) by mouth 2 (two) times daily. 04/17/20  Yes Penumalli, Glenford Bayley, MD  FARXIGA 10 MG TABS tablet Take 1 tablet by mouth once daily Patient not taking: Reported on 04/22/2021 07/21/19   Saguier, Ramon Dredge, PA-C  promethazine (PHENERGAN) 12.5 MG tablet Take 1 tablet (12.5 mg total) by mouth every 8 (eight) hours as needed for nausea or  vomiting. Patient not taking: Reported on 04/22/2021 04/03/21   Marisue Brooklyn   DG Chest 2 View  Result Date: 04/22/2021 CLINICAL DATA:  Chest pain EXAM: CHEST - 2 VIEW COMPARISON:  04/16/2020 FINDINGS: The heart size and mediastinal contours are within normal limits. Both lungs are clear. The visualized skeletal structures are unremarkable. IMPRESSION: No acute abnormality of the lungs. Electronically Signed   By: Jearld Lesch M.D.   On: 04/22/2021 12:37   ECHOCARDIOGRAM COMPLETE  Result Date: 04/23/2021    ECHOCARDIOGRAM REPORT   Patient Name:   LEILONI LENARZ Date of Exam: 04/23/2021 Medical Rec #:  [id number]          Height:       72.0 in Accession #:    [id number]         Weight:       169.0 lb Date of Birth:  04-15-1965         BSA:          1.983 m Patient Age:    55 years           BP:           99/65 mmHg Patient Gender: F                  HR:           82 bpm. Exam Location:  Inpatient Procedure: 2D Echo, Cardiac Doppler and Color Doppler Indications:    Elevated Troponin  History:        Patient has prior history of Echocardiogram examinations. Risk                 Factors:Hypertension, Diabetes and Dyslipidemia.  Sonographer:    Festus Barren Referring Phys:  [id number] BINAYA DAHAL IMPRESSIONS  1. Left ventricular ejection fraction, by estimation, is 60 to 65%. The left ventricle has normal function. The left ventricle has no regional wall motion abnormalities. Left ventricular diastolic parameters were normal.  2. Right ventricular systolic function is normal. The right ventricular size is normal.  3. The mitral valve is normal in structure. No evidence of mitral valve regurgitation. No evidence of mitral stenosis.  4. The aortic valve is normal in structure. Aortic valve regurgitation is not visualized. No aortic stenosis is present.  5. The inferior vena cava is normal in size with greater than 50% respiratory variability, suggesting right atrial pressure of 3 mmHg. FINDINGS  Left  Ventricle: Left ventricular ejection fraction, by estimation, is 60 to 65%. The left ventricle has normal function. The left ventricle has no regional wall motion abnormalities. The left ventricular internal cavity size was normal in size. There is  no left ventricular hypertrophy. Left ventricular diastolic parameters were normal. Indeterminate filling pressures. Right Ventricle: The right ventricular size is normal. No increase in right ventricular wall thickness. Right ventricular systolic function is normal. Left Atrium: Left atrial size was normal in size. Right Atrium: Right atrial size was normal in size. Pericardium: There is no evidence of pericardial effusion. Mitral Valve: The mitral valve is normal in structure. No evidence of mitral valve regurgitation. No evidence of mitral valve stenosis. Tricuspid Valve: The tricuspid valve is normal in structure. Tricuspid valve regurgitation is not demonstrated. No evidence of tricuspid stenosis. Aortic Valve: The aortic valve is normal in structure. Aortic valve regurgitation is not visualized. No aortic stenosis is present. Aortic valve mean gradient measures 3.0 mmHg. Aortic valve peak gradient measures 4.8 mmHg. Aortic valve area, by VTI measures 3.05 cm. Pulmonic Valve: The pulmonic valve was normal in structure. Pulmonic valve regurgitation is not visualized. No evidence of pulmonic stenosis. Aorta: The aortic root is normal in size and structure. Venous: The inferior vena cava is normal in size with greater than 50% respiratory variability, suggesting right atrial pressure of 3 mmHg. IAS/Shunts: No atrial level shunt detected by color flow Doppler.  LEFT VENTRICLE PLAX 2D LVIDd:         4.50 cm     Diastology LVIDs:         3.00 cm     LV e' medial:    7.51 cm/s LV PW:         1.00 cm     LV E/e' medial:  13.2 LV IVS:        0.90 cm     LV e' lateral:   9.79 cm/s LVOT diam:     2.20 cm     LV E/e' lateral: 10.2 LV SV:         59 LV SV Index:   30 LVOT Area:      3.80 cm  LV Volumes (MOD) LV vol d, MOD A2C: 67.7 ml LV vol d, MOD A4C: 59.2 ml LV vol s, MOD A2C: 24.7 ml LV vol s, MOD A4C: 24.1 ml LV SV MOD A2C:     43.0 ml LV SV MOD A4C:     59.2 ml LV SV MOD BP:      39.9 ml RIGHT VENTRICLE             IVC RV Basal diam:  4.25 cm     IVC diam: 1.90 cm RV Mid diam:    3.10 cm RV S prime:     13.80 cm/s  TAPSE (M-mode): 2.6 cm LEFT ATRIUM             Index        RIGHT ATRIUM           Index LA diam:        3.00 cm 1.51 cm/m   RA Area:     12.30 cm LA Vol (A2C):   55.4 ml 27.93 ml/m  RA Volume:   31.30 ml  15.78 ml/m LA Vol (A4C):   27.0 ml 13.61 ml/m LA Biplane Vol: 41.0 ml 20.67 ml/m  AORTIC VALVE                    PULMONIC VALVE AV Area (Vmax):    2.92 cm     PV Vmax:       0.41 m/s AV Area (Vmean):   2.97 cm     PV Vmean:      28.400 cm/s AV Area (VTI):     3.05 cm     PV VTI:        0.088 m AV Vmax:           110.00 cm/s  PV Peak grad:  0.7 mmHg AV Vmean:          73.300 cm/s  PV Mean grad:  0.0 mmHg AV VTI:            0.193 m AV Peak Grad:      4.8 mmHg AV Mean Grad:      3.0 mmHg LVOT Vmax:         84.40 cm/s LVOT Vmean:        57.200 cm/s LVOT VTI:          0.155 m LVOT/AV VTI ratio: 0.80  AORTA Ao Root diam: 2.80 cm Ao Asc diam:  2.70 cm MITRAL VALVE MV Area (PHT): 4.68 cm    SHUNTS MV Decel Time: 162 msec    Systemic VTI:  0.16 m MV E velocity: 99.40 cm/s  Systemic Diam: 2.20 cm MV A velocity: 59.70 cm/s MV E/A ratio:  1.66 Mihai Croitoru MD Electronically signed by Thurmon Fair MD Signature Date/Time: 04/23/2021/4:51:50 PM    Final    - pertinent xrays, CT, MRI studies were reviewed and independently interpreted  Positive ROS: All other systems have been reviewed and were otherwise negative with the exception of those mentioned in the HPI and as above.  Physical Exam: General: Alert, no acute distress Psychiatric: Patient is competent for consent with normal mood and affect Lymphatic: No axillary or cervical lymphadenopathy Cardiovascular: No  pedal edema Respiratory: No cyanosis, no use of accessory musculature GI: No organomegaly, abdomen is soft and non-tender    Images:  @ENCIMAGES @  Labs:  Lab Results  Component Value Date   HGBA1C 12.3 (H) 04/23/2021   HGBA1C 7.9 (H) 08/24/2020   HGBA1C 7.7 (H) 07/09/2017   ESRSEDRATE 41 (H) 08/24/2020   REPTSTATUS PENDING 04/22/2021   CULT  04/22/2021    NO GROWTH < 24 HOURS Performed at [facility], [address] [place], [place] [address]    [facility] ESCHERICHIA COLI 03/29/2012    Lab Results  Component Value Date   ALBUMIN 3.3 (L) 04/22/2021   ALBUMIN 3.9 04/03/2021   ALBUMIN 3.8 01/17/2021     CBC EXTENDED Latest Ref Rng & Units 04/24/2021 04/23/2021 04/22/2021  WBC 4.0 - 10.5 K/uL 9.5 8.1 9.9  RBC 3.87 - 5.11 MIL/uL 3.41(L) 3.62(L) 4.36  HGB 12.0 -  15.0 g/dL 10.2(L) 10.6(L) 12.9  HCT 36.0 - 46.0 % 31.3(L) 32.5(L) 39.2  PLT 150 - 400 K/uL 296 279 260  NEUTROABS 1.7 - 7.7 K/uL 5.5 - -  LYMPHSABS 0.7 - 4.0 K/uL 2.6 - -    Neurologic: Patient does not have protective sensation bilateral lower extremities.   MUSCULOSKELETAL:   Skin: Examination patient has cellulitis mid left calf.  The swelling seems to be resolving but there is still tenderness to palpation with a central blister core to the abscess.  Patient has a palpable dorsalis pedis pulse.  No ascending cellulitis.  Patient has a hemoglobin A1c of 12.3 and a white cell count of 9.5.  Hemoglobin 10.2.  Assessment: Assessment: Abscess left calf.  Plan: We will plan for excisional debridement of the ulcer and abscess with wound closure and application of a wound VAC.  Risk and benefits were discussed including need for additional surgery.  Patient states she understands wished to proceed at this time will send tissue for cultures.  Thank you for the consult and the opportunity to see Ms. Hillard Danker, MD [facility] [phone] 7:28 AM

## 2021-04-24 NOTE — Progress Notes (Addendum)
Inpatient Diabetes Program Recommendations ? ?AACE/ADA: New Consensus Statement on Inpatient Glycemic Control (2015) ? ?Target Ranges:  Prepandial:   less than 140 mg/dL ?     Peak postprandial:   less than 180 mg/dL (1-2 hours) ?     Critically ill patients:  140 - 180 mg/dL  ? ?Lab Results  ?Component Value Date  ? GLUCAP 132 (H) 04/24/2021  ? HGBA1C 12.3 (H) 04/23/2021  ? ? ?Review of Glycemic Control ? Latest Reference Range & Units 04/24/21 07:36 04/24/21 10:52 04/24/21 11:14  ?Glucose-Capillary 70 - 99 mg/dL 201 (H) 52 (L) 132 (H)  ?(H): Data is abnormally high ?(L): Data is abnormally low ? ?Diabetes history: DM2 ?Outpatient Diabetes medications: Tresiba 20 units BID, Metformin 500 mg BID ?Current orders for Inpatient glycemic control: Tresiba 45 units QHS, Novolog 0-9 units TID/HS ? ?Referral received for DM and insulin teaching.  Spoke with patient at bedside.  She confirms above home medications.  She is current with her PCP.  Reviewed patient's current A1c of 12.3% (average blood sugar of 306 mg/dL). Explained what a A1c is and what it measures. Also reviewed goal A1c with patient, importance of good glucose control @ home, and blood sugar goals. ? ?She states her glucose has been elevated since September when she started working.  She states she stands all day and needs to keep eating.   ? ?Patient is very sleeping and closing eyes.  RN to bedside and states her CBG was 52 mg?dL a few minutes ago and she administered dextrose as she is NPO.  CBG up to 132 mg/dL.  Patient sleeping.  Will follow up tomorrow.  Procedure scheduled for 4PM today.   ? ?Ordered LWWD.   ? ?Addendum'@1348'$ : ? ?CBG was 65 mg/dL at 1322. ? ?Please reduce Semglee to 20 units QHS ? ?Will continue to follow while inpatient. ? ?Thank you, ?Reche Dixon, MSN, RN ?Diabetes Coordinator ?Inpatient Diabetes Program ?[phone] (team pager from 8a-5p) ? ? ? ? ? ? ?

## 2021-04-24 NOTE — Anesthesia Procedure Notes (Signed)
Procedure Name: LMA Insertion ?Date/Time: 04/24/2021 4:16 PM ?Performed by: Genelle Bal, CRNA ?Pre-anesthesia Checklist: Patient identified, Emergency Drugs available, Suction available and Patient being monitored ?Patient Re-evaluated:Patient Re-evaluated prior to induction ?Oxygen Delivery Method: Circle system utilized ?Preoxygenation: Pre-oxygenation with 100% oxygen ?Induction Type: IV induction ?Ventilation: Mask ventilation without difficulty ?LMA: LMA inserted ?LMA Size: 4.0 ?Number of attempts: 1 ?Airway Equipment and Method: Bite block ?Placement Confirmation: positive ETCO2 ?Tube secured with: Tape ?Dental Injury: Teeth and Oropharynx as per pre-operative assessment  ? ? ? ? ?

## 2021-04-24 NOTE — Anesthesia Postprocedure Evaluation (Signed)
Anesthesia Post Note ? ?Patient: [name] ? ?Procedure(s) Performed: LEFT LEG DEBRIDEMENT (Left) ? ?  ? ?Patient location during evaluation: PACU ?Anesthesia Type: General ?Level of consciousness: awake and alert and oriented ?Pain management: pain level controlled ?Vital Signs Assessment: post-procedure vital signs reviewed and stable ?Respiratory status: spontaneous breathing, nonlabored ventilation and respiratory function stable ?Cardiovascular status: blood pressure returned to baseline and stable ?Postop Assessment: no apparent nausea or vomiting ?Anesthetic complications: no ? ? ?No notable events documented. ? ?Last Vitals:  ?Vitals:  ? 04/24/21 1700 04/24/21 1715  ?BP: 118/77 112/61  ?Pulse: 92 86  ?Resp: 15 16  ?Temp: 37.3 ?C   ?SpO2: 95% 94%  ?  ?Last Pain:  ?Vitals:  ? 04/24/21 1700  ?TempSrc:   ?PainSc: 5   ? ? ?  ?  ?  ?  ?  ?  ? ?Chan Sheahan A. ? ? ? ? ?

## 2021-04-24 NOTE — Anesthesia Preprocedure Evaluation (Addendum)
Anesthesia Evaluation  ?Patient identified by MRN, date of birth, ID band ?Patient awake ? ? ? ?Reviewed: ?Allergy & Precautions, NPO status , Patient's Chart, lab work & pertinent test results ? ?History of Anesthesia Complications ?Negative for: history of anesthetic complications ? ?Airway ?Mallampati: II ? ?TM Distance: >3 FB ?Neck ROM: Full ? ? ? Dental ? ?(+) Dental Advisory Given ?  ?Pulmonary ?neg pulmonary ROS,  ?  ?breath sounds clear to auscultation ? ? ? ? ? ? Cardiovascular ?hypertension, Pt. on medications and Pt. on home beta blockers ?(-) angina ?Rhythm:Regular Rate:Normal ? ?04/23/2021 ECHO: EF 60-65%, normal LVF, normal RVF, no significant valvular abnormalities ?'21 Stress test: Normal perfusion. LVEF 47% with mild global hypokinesis ?  ?Neuro/Psych ? Headaches, Anxiety Depression   ? GI/Hepatic ?Neg liver ROS, GERD  Medicated and Controlled,  ?Endo/Other  ?diabetes (glu 70), Oral Hypoglycemic Agents ? Renal/GU ?Renal InsufficiencyRenal disease  ? ?  ?Musculoskeletal ? ? Abdominal ?  ?Peds ? Hematology ? ?(+) Blood dyscrasia (Hb 10.2), anemia ,   ?Anesthesia Other Findings ? ? Reproductive/Obstetrics ? ?  ? ? ? ? ? ? ? ? ? ? ? ? ? ?  ?  ? ? ? ? ? ? ? ?Anesthesia Physical ?Anesthesia Plan ? ?ASA: 3 ? ?Anesthesia Plan: General  ? ?Post-op Pain Management: Tylenol PO (pre-op)*  ? ?Induction: Intravenous ? ?PONV Risk Score and Plan: 3 and Ondansetron, Dexamethasone and Scopolamine patch - Pre-op ? ?Airway Management Planned: LMA ? ?Additional Equipment: None ? ?Intra-op Plan:  ? ?Post-operative Plan:  ? ?Informed Consent: I have reviewed the patients History and Physical, chart, labs and discussed the procedure including the risks, benefits and alternatives for the proposed anesthesia with the patient or authorized representative who has indicated his/her understanding and acceptance.  ? ? ? ?Dental advisory given ? ?Plan Discussed with: CRNA and Surgeon ? ?Anesthesia  Plan Comments:   ? ? ? ? ? ?Anesthesia Quick Evaluation ? ?

## 2021-04-25 ENCOUNTER — Encounter (HOSPITAL_COMMUNITY): Payer: Self-pay | Admitting: Orthopedic Surgery

## 2021-04-25 LAB — BASIC METABOLIC PANEL
Anion gap: 13 (ref 5–15)
BUN: 25 mg/dL — ABNORMAL HIGH (ref 6–20)
CO2: 25 mmol/L (ref 22–32)
Calcium: 8.9 mg/dL (ref 8.9–10.3)
Chloride: 99 mmol/L (ref 98–111)
Creatinine, Ser: 1.08 mg/dL — ABNORMAL HIGH (ref 0.44–1.00)
GFR, Estimated: >60 mL/min (ref >60)
Glucose, Bld: 151 mg/dL — ABNORMAL HIGH (ref 70–99)
Potassium: 4.2 mmol/L (ref 3.5–5.1)
Sodium: 137 mmol/L (ref 135–145)

## 2021-04-25 LAB — CBC WITH DIFFERENTIAL/PLATELET
Abs Immature Granulocytes: 0.02 10*3/uL (ref 0.00–0.07)
Basophils Absolute: 0 10*3/uL (ref 0.0–0.1)
Basophils Relative: 0 %
Eosinophils Absolute: 0 10*3/uL (ref 0.0–0.5)
Eosinophils Relative: 0 %
HCT: 30.6 % — ABNORMAL LOW (ref 36.0–46.0)
Hemoglobin: 9.4 g/dL — ABNORMAL LOW (ref 12.0–15.0)
Immature Granulocytes: 0 %
Lymphocytes Relative: 6 %
Lymphs Abs: 0.5 10*3/uL — ABNORMAL LOW (ref 0.7–4.0)
MCH: 27.4 pg (ref 26.0–34.0)
MCHC: 30.7 g/dL (ref 30.0–36.0)
MCV: 89.2 fL (ref 80.0–100.0)
Monocytes Absolute: 1.6 10*3/uL — ABNORMAL HIGH (ref 0.1–1.0)
Monocytes Relative: 20 %
Neutro Abs: 5.9 10*3/uL (ref 1.7–7.7)
Neutrophils Relative %: 74 %
Platelets: 201 10*3/uL (ref 150–400)
RBC: 3.43 MIL/uL — ABNORMAL LOW (ref 3.87–5.11)
RDW: 15.9 % — ABNORMAL HIGH (ref 11.5–15.5)
WBC: 8 10*3/uL (ref 4.0–10.5)
nRBC: 0 % (ref 0.0–0.2)

## 2021-04-25 LAB — GLUCOSE, CAPILLARY
Glucose-Capillary: 343 mg/dL — ABNORMAL HIGH (ref 70–99)
Glucose-Capillary: 289 mg/dL — ABNORMAL HIGH (ref 70–99)

## 2021-04-25 MED ORDER — SODIUM CHLORIDE 0.9 % IV SOLN: INTRAVENOUS | Status: DC

## 2021-04-25 NOTE — Progress Notes (Addendum)
?PROGRESS NOTE ? ?[name]  ?DOB: [birth year]  ?PCP: Mackie Pai, PA-C ?JXB:[id number]  ?DOA: 04/22/2021 ? LOS: 3 days  ?Hospital Day: 4 ? ?Brief narrative: ?[name] is a 56 y.o. female with PMH significant for DM2, HTN, HLD, CAD, cardiomyopathy, CVA, CKD, diabetic gastroparesis, diabetic neuropathy, GERD, depression ?Patient presented to the ED on 3/13 with complaint of left leg cellulitis not responding to a course of oral doxycycline as an outpatient. ?It started as a small swelling and blister that noted on 3/9.  It enlarged and the blister ruptured on 3/11. ? ?In the ED, patient was hemodynamically stable, blood pressure in 90s ?Labs with sodium 131, glucose 310, BUN/creatinine 23/1.59, lactic acid elevated to 2.3 and later worsened to 3.4, unremarkable CBC ?Chest x-ray unremarkable ?Blood culture was sent ?EKG with normal sinus rhythm ?Patient was given IV antibiotics, IV fluid, IV pain medicine ?Admitted to hospitalist service for further evaluation management ?Seen by orthopedics. ?3/15, underwent left leg excisional debridement and wound VAC placement. ? ?Subjective: ?Patient was seen and examined this morning.  ?Propped up in bed taking her breakfast.  Not in distress.  Pain controlled. ? ? ?Principal Problem: ?  Cellulitis of left leg ?Active Problems: ?  Sepsis due to cellulitis [facility]) ?  Severe sepsis ([place]) ?  Chest pain, unspecified ?  Acute kidney injury superimposed on chronic kidney disease ([place]) ?  Uncontrolled type 2 diabetes mellitus with hyperglycemia, without long-term current use of insulin ([place]) ?  Dyslipidemia ?  Abscess of left lower leg ?  Necrotizing fasciitis ([place]) ?  ?Assessment and Plan: ?Sepsis -POA ?Left leg cellulitis and abscess ?Necrotizing fasciitis ?-Presented with worsening cellulitis of left leg with central pus collection. ?-Orthopedic consult appreciated. ?-3/15, underwent left leg excisional debridement and wound VAC placement.  Per orthopedics note,  there was necrotic fascia and gas in the soft tissue concerning for possible necrotizing fasciitis. ?-Currently on IV cefepime and IV vancomycin. ?-Sepsis physiology improving. ?-Continue IV hydration with NS at 75 mill per hour ?-Pain management: Scheduled Percocet, as needed IV Dilaudid.  ?Recent Labs  ?Lab 04/19/21 ?1611 04/19/21 ?1616 04/22/21 ?1244 04/22/21 ?1447 04/23/21 ?0240 04/24/21 ?0208 04/25/21 ?0200  ?WBC 15.1*  --  9.9  --  8.1 9.5 8.0  ?LATICACIDVEN  --  1.2 2.3* 3.4*  --   --   --   ?PROCALCITON  --   --   --   --  0.52  --   --   ? ?AKI on CKD2 ?-Creatinine elevated on admission probably because of sepsis.  Improving on IV fluid.  Continue to monitor. ?Recent Labs  ?  08/24/20 ?1000 01/17/21 ?1120 04/03/21 ?1431 04/19/21 ?1611 04/22/21 ?1244 04/23/21 ?0240 04/24/21 ?0208 04/25/21 ?0200  ?BUN '19 20 19 '$ 21* 23* 14 12 25*  ?CREATININE 0.94 1.23* 1.05 1.54* 1.59* 1.15* 1.21* 1.08*  ? ?Uncontrolled type 2 diabetes mellitus ?hyperglycemia and hypoglycemia ?-Patient had a significant worsening of A1c from 7.9 on July 2022 to 12.3 on 04/13/2021. ?-Home meds include Tresiba 40 units daily, Farxiga, metformin. ?-Her blood glucose level seems to be widely fluctuating.  While n.p.o. yesterday for surgery, she was hypoglycemic and was started on D5 NS. ?-Patient received 20 units of Semglee last night.  Blood sugar level over 300 this morning.  I just switched her from D5 NS to NS. ?-Continue to monitor blood sugar with sliding scale insulin.  Continue Farxiga.  Metformin on hold. ?Recent Labs  ?Lab 04/24/21 ?1630 04/24/21 ?1659 04/24/21 ?1837 04/24/21 ?  1954 04/25/21 ?1103  ?GLUCAP 153* 124* 102* 147* 343*  ? ?Chest pain, unspecified ?-She complained of chest pain on the day of admission.  Troponin was negative.  Echocardiogram with EF 60 to 65%, no wall motion abnormality.  No suspicion of ischemic etiology at this time. ?Recent Labs  ?  04/22/21 ?1447 04/22/21 ?2246 04/23/21 ?0240  ?TROPONINIHS '5 6 8   '$ ? ?Dyslipidemia ?-Continue statin ? ?Goals of care ?  Code Status: Full Code  ? ? ?Mobility: Encourage ambulation ? ?Nutritional status:  ?Body mass index is 22.92 kg/m?.  ?  ?  ? ? ? ? ?Diet:  ?Diet Order   ? ?       ?  Diet Carb Modified Fluid consistency: Thin; Room service appropriate? Yes  Diet effective now       ?  ? ?  ?  ? ?  ? ? ?DVT prophylaxis:  ?SCDs Start: 04/24/21 1810 ?enoxaparin (LOVENOX) injection 40 mg Start: 04/22/21 2230 ?  ?Antimicrobials: IV cefepime and IV vancomycin ?Fluid: NS at 75 ?Consultants: Orthopedics ?Family Communication: None at bedside ? ?Status is: Inpatient ? ?Continue in-hospital care because: POD1 ?Level of care: Med-Surg  ? ?Dispo: The patient is from: Home ?             Anticipated d/c is to: Pending clinical course ?             Patient currently is not medically stable to d/c. ?  Difficult to place patient No ? ? ? ? ?Infusions:  ? sodium chloride    ? ceFEPime (MAXIPIME) IV 2 g (04/25/21 0949)  ? methocarbamol (ROBAXIN) IV    ? vancomycin 1,500 mg (04/25/21 1209)  ? ? ?Scheduled Meds: ? amLODipine  10 mg Oral Daily  ? aspirin EC  81 mg Oral Daily  ? atorvastatin  80 mg Oral Daily  ? carvedilol  25 mg Oral BID WC  ? cholecalciferol  400 Units Oral Daily  ? docusate sodium  100 mg Oral BID  ? enoxaparin (LOVENOX) injection  40 mg Subcutaneous Q24H  ? famotidine  20 mg Oral Daily  ? fluticasone  2 spray Each Nare Daily  ? fluticasone furoate-vilanterol  1 puff Inhalation Daily  ? gabapentin  600 mg Oral TID  ? insulin aspart  0-9 Units Subcutaneous TID AC & HS  ? insulin glargine-yfgn  20 Units Subcutaneous QHS  ? meloxicam  7.5 mg Oral Daily  ? oxyCODONE-acetaminophen  1 tablet Oral Q6H  ? sertraline  100 mg Oral Daily  ? topiramate  50 mg Oral BID  ? ? ?PRN meds: ?acetaminophen, albuterol, bisacodyl, busPIRone, HYDROmorphone (DILAUDID) injection, magnesium hydroxide, methocarbamol **OR** methocarbamol (ROBAXIN) IV, metoCLOPramide **OR** metoCLOPramide (REGLAN) injection,  nitroGLYCERIN, ondansetron **OR** ondansetron (ZOFRAN) IV, oxyCODONE, oxyCODONE, polyethylene glycol, promethazine, SUMAtriptan, traZODone  ? ?Antimicrobials: ?Anti-infectives (From admission, onward)  ? ? Start     Dose/Rate Route Frequency Ordered Stop  ? 04/24/21 1200  vancomycin (VANCOREADY) IVPB 1500 mg/300 mL       ? 1,500 mg ?150 mL/hr over 120 Minutes Intravenous Every 24 hours 04/23/21 1455    ? 04/24/21 0600  ceFAZolin (ANCEF) IVPB 2g/100 mL premix       ? 2 g ?200 mL/hr over 30 Minutes Intravenous On call to O.R. 04/23/21 1928 04/24/21 1650  ? 04/23/21 1200  vancomycin (VANCOREADY) IVPB 1250 mg/250 mL  Status:  Discontinued       ? 1,250 mg ?166.7 mL/hr over 90 Minutes Intravenous Every  24 hours 04/22/21 1342 04/23/21 1455  ? 04/23/21 1000  ceFEPIme (MAXIPIME) 2 g in sodium chloride 0.9 % 100 mL IVPB       ? 2 g ?200 mL/hr over 30 Minutes Intravenous Every 12 hours 04/22/21 2149    ? 04/22/21 2245  metroNIDAZOLE (FLAGYL) tablet 500 mg  Status:  Discontinued       ? 500 mg Oral Every 12 hours 04/22/21 2155 04/22/21 2257  ? 04/22/21 2230  ceFEPIme (MAXIPIME) 2 g in sodium chloride 0.9 % 100 mL IVPB       ? 2 g ?200 mL/hr over 30 Minutes Intravenous  Once 04/22/21 2135 04/22/21 2314  ? 04/22/21 2230  metroNIDAZOLE (FLAGYL) IVPB 500 mg  Status:  Discontinued       ? 500 mg ?100 mL/hr over 60 Minutes Intravenous Every 12 hours 04/22/21 2135 04/22/21 2155  ? 04/22/21 2230  vancomycin (VANCOCIN) IVPB 1000 mg/200 mL premix  Status:  Discontinued       ? 1,000 mg ?200 mL/hr over 60 Minutes Intravenous  Once 04/22/21 2135 04/22/21 2155  ? 04/22/21 1345  vancomycin (VANCOCIN) IVPB 1000 mg/200 mL premix       ? 1,000 mg ?200 mL/hr over 60 Minutes Intravenous  Once 04/22/21 1338 04/22/21 1506  ? ?  ? ? ?Objective: ?Vitals:  ? 04/25/21 0443 04/25/21 0841  ?BP: 110/70 98/63  ?Pulse: 89 92  ?Resp: 17 19  ?Temp: 97.8 ?F (36.6 ?C) 98.3 ?F (36.8 ?C)  ?SpO2: 93% 95%  ? ? ?Intake/Output Summary (Last 24 hours) at 04/25/2021  1254 ?Last data filed at 04/25/2021 0900 ?Gross per 24 hour  ?Intake 1375.58 ml  ?Output 785 ml  ?Net 590.58 ml  ? ? ?Filed Weights  ? 04/22/21 1219 04/22/21 1253  ?Weight: 76.7 kg 76.7 kg  ? ?Weight change:  ?Body m

## 2021-04-25 NOTE — Progress Notes (Addendum)
Inpatient Diabetes Program Recommendations ? ?AACE/ADA: New Consensus Statement on Inpatient Glycemic Control (2015) ? ?Target Ranges:  Prepandial:   less than 140 mg/dL ?     Peak postprandial:   less than 180 mg/dL (1-2 hours) ?     Critically ill patients:  140 - 180 mg/dL  ? ?Lab Results  ?Component Value Date  ? GLUCAP 343 (H) 04/25/2021  ? HGBA1C 12.3 (H) 04/23/2021  ? ? ?Review of Glycemic Control ? Latest Reference Range & Units 04/25/21 11:03  ?Glucose-Capillary 70 - 99 mg/dL 343 (H)  ?(H): Data is abnormally high ? ?Diabetes history: DM2 ?Outpatient Diabetes medications: Tresiba 20 units BID, Metformin  ?Current orders for Inpatient glycemic control: Semglee 2 units QHS, Novolog 0-9 units ac/hs ? ?CBG of 343 mg/dL was obtained just after eating her meal.  No AM CBG noted.  Spoke with patient again at bedside.  Reviewed patient's current A1c of 12.3% (average blood sugar of 36 mg/dL). Explained what a A1c is and what it measures. Also reviewed goal A1c with patient, importance of good glucose control @ home, and blood sugar goals. ? ?She states she eats candy and drinks Gatorade throughout the day while working.  She does not have a glucometer therefore does not check her blood glucose.  Will provide a glucometer for her as she is uninsured.   ? ?Encouraged her to avoid caloric beverages and try Gatorade zero or G2 which has 8 CHO in a small bottle.  Asked her to check her CBG at least daily.   ? ?Discussed long and short term complications of uncontrolled blood sugars.   ? ?Addendum'@1234'$ : ? ?Bought patient a [employer] glucometer and supplies.  Asked her how she affords her [place] without insurance.  She states she still has some from when she moved from [facility] a couple of months ago.  She will have insurance starting in April.  Will ask TOC to assist with MATCH. ? ?Will continue to follow while inpatient. ? ?Thank you, ?Reche Dixon, MSN, RN ?Diabetes Coordinator ?Inpatient Diabetes Program ?[phone] (team  pager from 8a-5p) ? ? ? ? ? ? ?

## 2021-04-25 NOTE — TOC Initial Note (Signed)
Transition of Care (TOC) - Initial/Assessment Note  ? ? ?Patient Details  ?Name: [name] ?MRN: [id number] ?Date of Birth: 1965/11/06 ? ?[facility] of [facility]) CM/SW Contact:    ?Sharin Mons, RN ?Phone Number: ?04/25/2021, 4:10 PM ? ?Clinical Narrative:      ?NCM spoke with pt regarding d/c planning. Pt states from home alone. PTA independent with ADL's, no DME usage. States can manage care once d/c. Pt without health insurance. [facility] referral made for medicaid screening. Pt states works but was evasive with giving information. ? ?? MATCH Letter needed @ d/c... ? ?Pt states has transportation to home. ? ?TOC team watching for needs. ? ?Expected Discharge Plan: Home/Self Care ?Barriers to Discharge: Continued Medical Work up ? ? ?Patient Goals and CMS Choice ?  ?  ?  ? ?Expected Discharge Plan and Services ?Expected Discharge Plan: Home/Self Care ?In-house Referral: Development worker, community ([facility]) ?Discharge Planning Services: CM Consult ?  ?Living arrangements for the past 2 months: Apartment (one level apartment) ?                ?  ?  ?  ?  ?  ?  ?  ?  ?  ?  ? ?Prior Living Arrangements/Services ?Living arrangements for the past 2 months: Apartment (one level apartment) ?Lives with:: Self ?Patient language and need for interpreter reviewed:: Yes ?Do you feel safe going back to the place where you live?: Yes      ?Need for Family Participation in Patient Care: Yes (Comment) ?Care giver support system in place?: No (comment) ?  ?Criminal Activity/Legal Involvement Pertinent to Current Situation/Hospitalization: No - Comment as needed ? ?Activities of Daily Living ?Home Assistive Devices/Equipment: None ?ADL Screening (condition at time of admission) ?Patient's cognitive ability adequate to safely complete daily activities?: Yes ?Is the patient deaf or have difficulty hearing?: No ?Does the patient have difficulty seeing, even when wearing glasses/contacts?: No ?Does the patient have  difficulty concentrating, remembering, or making decisions?: No ?Patient able to express need for assistance with ADLs?: Yes ?Does the patient have difficulty dressing or bathing?: No ?Independently performs ADLs?: Yes (appropriate for developmental age) ?Does the patient have difficulty walking or climbing stairs?: No ?Weakness of Legs: None ?Weakness of Arms/Hands: None ? ?Permission Sought/Granted ?  ?  ?   ?   ?   ?   ? ?Emotional Assessment ?Appearance:: Appears younger than stated age ?Attitude/Demeanor/Rapport: Engaged ?Affect (typically observed): Accepting ?Orientation: : Oriented to Self, Oriented to Place, Oriented to  Time, Oriented to Situation ?Alcohol / Substance Use: Not Applicable ?Psych Involvement: No (comment) ? ?Admission diagnosis:  Cellulitis of left leg [L03.116] ?Patient Active Problem List  ? Diagnosis Date Noted  ? Abscess of left lower leg   ? Necrotizing fasciitis ([place])   ? Cellulitis of left leg 04/22/2021  ? Sepsis due to cellulitis ([place]) 04/22/2021  ? Severe sepsis ([place]) 04/22/2021  ? Acute kidney injury superimposed on chronic kidney disease ([place]) 04/22/2021  ? Chest pain, unspecified 04/22/2021  ? Uncontrolled type 2 diabetes mellitus with hyperglycemia, without long-term current use of insulin ([place]) 04/22/2021  ? Pain of left calf 04/19/2021  ? Viral upper respiratory tract infection 11/30/2020  ? Trigeminal neuralgia   ? Cervical cancer ([place])   ? Chronic back pain   ? Coronary artery disease   ? GERD (gastroesophageal reflux disease)   ? Hyperlipidemia   ? Hypertension   ? Dyslipidemia 02/01/2019  ? Cardiac murmur 02/01/2019  ?  Lumbar spinal stenosis 02/11/2018  ? Neuropathy 07/20/2017  ? Right flank pain   ? Nausea & vomiting 07/07/2017  ? Acute cystitis 07/07/2017  ? CAD (coronary artery disease) 07/07/2017  ? Metatarsalgia of both feet 03/11/2017  ? Diabetic gastroparesis associated with type 1 diabetes mellitus ([place]) 11/27/2016  ? Pure hypercholesterolemia 11/27/2016  ? Lumbar  facet arthropathy 05/14/2016  ? Chronic pain syndrome 01/23/2016  ? Diabetic peripheral neuropathy ([place]) 01/23/2016  ? Anxiety 01/22/2016  ? History of cervical cancer 08/17/2015  ? Depression 08/10/2015  ? Overweight (BMI 25.0-29.9) 07/21/2014  ? Chronic kidney disease, stage III (moderate) ([place]) 05/23/2014  ? Diabetes type 2, uncontrolled 05/23/2014  ? Current use of insulin ([place]) 05/23/2014  ? Vitamin D deficiency 04/25/2013  ? Cerebrovascular disease 09/13/2012  ? Migraine 09/13/2012  ? Nonspecific abnormal findings on radiological and examination of skull and head 09/13/2012  ? Retention of urine 06/25/2011  ? ANKLE PAIN, LEFT 12/25/2009  ? DYSURIA 12/25/2009  ? VAGINITIS, CANDIDAL 12/25/2009  ? ULCER-GASTRIC 09/28/2009  ? DIABETIC PERIPHERAL NEUROPATHY 09/21/2009  ? TOBACCO ABUSE 09/21/2009  ? URINARY TRACT INFECTION 09/21/2009  ? INSOMNIA 09/21/2009  ? TRANSAMINASES, SERUM, ELEVATED 09/21/2009  ? Elevation of level of transaminase or lactic acid dehydrogenase (LDH) 09/21/2009  ? Type 2 diabetes mellitus with stage 3 chronic kidney disease ([place]) 09/14/2009  ? Nausea with vomiting, unspecified 09/14/2009  ? ABDOMINAL PAIN-EPIGASTRIC 09/14/2009  ? Cardiomyopathy, secondary ([place]) 09/06/2009  ? Cardiomyopathy, unspecified ([place]) 09/06/2009  ? Essential hypertension 08/30/2009  ? DIABETES MELLITUS, TYPE II, UNCONTROLLED 07/30/2009  ? HLD (hyperlipidemia) 07/30/2009  ? GERD 07/30/2009  ? GASTROPARESIS 07/30/2009  ? CHEST PAIN, ATYPICAL 07/30/2009  ? POSITIVE PPD 07/30/2009  ? Type 2 diabetes mellitus with hyperglycemia ([place]) 07/30/2009  ? Gastric atony 07/30/2009  ? Gastroesophageal reflux disease 07/30/2009  ? Hyperlipidemia, unspecified 07/30/2009  ? Tuberculin test reaction 07/30/2009  ? ?PCP:  Mackie Pai, PA-C ?Pharmacy:   ?[id number] [place] ?[address] ?[place] [address] ?Phone: [phone] Fax: [phone] ? ?[place], [place] - [address] ?[address] ?[place] [address] ?Phone: [phone] Fax: [phone] ? ?[place], [place] ?[place] [address] ?Phone: [phone] Fax: [phone] ? ?[place], [place]. ?[place] ?[place] [address] ?Phone: [phone] Fax: [phone] ? ? ? ? ?Social Determinants of Health (SDOH) Interventions ?  ? ?Readmission Risk Interventions ?No flowsheet data found. ? ? ?

## 2021-04-25 NOTE — Evaluation (Signed)
Physical Therapy Evaluation ?Patient Details ?Name: [name] ?MRN: [id number] ?DOB: Nov 12, 1965 ?Today's Date: 04/25/2021 ? ?History of Present Illness ? Pt is a 56 y.o. female admitted 04/22/21 with LLE cellulitis and abcess not responding to a course of oral doxycycline as an outpatient. S/p LLE debridement 3/15. PMH includes CAD, CKD III, DM2, HTN, HLD, cardiomyopathy, lumbar stenosis, anxiety. ?  ?Clinical Impression ? Pt presents with an overall decrease in functional mobility secondary to above. PTA, pt independent, lives alone, values her independence. Today, pt able to initiate transfer and gait training with and without RW; encouraged use of RW to limit WB through LLE and improve gait mechanics. Pt would benefit from continued acute PT services to maximize functional mobility and independence prior to d/c home. Pt reports hopeful for d/c home tomorrow.     ? ?Recommendations for follow up therapy are one component of a multi-disciplinary discharge planning process, led by the attending physician.  Recommendations may be updated based on patient status, additional functional criteria and insurance authorization. ? ?Follow Up Recommendations No PT follow up ? ?  ?Assistance Recommended at Discharge PRN  ?Patient can return home with the following ? Assistance with cooking/housework;Assist for transportation ? ?  ?Equipment Recommendations Rolling walker (2 wheels)  ?Recommendations for Other Services ?    ?  ?Functional Status Assessment Patient has had a recent decline in their functional status and demonstrates the ability to make significant improvements in function in a reasonable and predictable amount of time.  ? ?  ?Precautions / Restrictions Precautions ?Precautions: Fall;Other (comment) ?Precaution Comments: LLE wound vac ?Restrictions ?Weight Bearing Restrictions: Yes ?LLE Weight Bearing: Weight bearing as tolerated  ? ?  ? ?Mobility ? Bed Mobility ?Overal bed mobility: Independent ?  ?  ?  ?   ?  ?  ?  ?  ? ?Transfers ?Overall transfer level: Needs assistance ?Equipment used: Rolling walker (2 wheels), None ?Transfers: Sit to/from Stand ?Sit to Stand: Min guard ?  ?  ?  ?  ?  ?General transfer comment: initial sit<>stand from bed and recliner to RW, min guard; additional stand from recliner without DME, min guard for balance ?  ? ?Ambulation/Gait ?Ambulation/Gait assistance: Min guard ?Gait Distance (Feet): 58 Feet ?Assistive device: Rolling walker (2 wheels), IV Pole, None ?Gait Pattern/deviations: Step-to pattern, Decreased dorsiflexion - left, Knee flexed in stance - left, Antalgic, Trunk flexed, Decreased weight shift to left ?Gait velocity: Decreased ?  ?  ?General Gait Details: Slow, antalgic gait with RW and initial min guard for balance; pt progressing to use of only IV pole, then no UE support; difficulty reaching L heel to floor due to pain; encouraged use of RW initially to allow for improved gait mechanics and stability ? ?Stairs ?Stairs:  (pt declines, "Oh I'm not worried about my stairs") ?  ?  ?  ?  ? ?Wheelchair Mobility ?  ? ?Modified Rankin (Stroke Patients Only) ?  ? ?  ? ?Balance Overall balance assessment: Needs assistance ?  ?Sitting balance-Leahy Scale: Good ?  ?  ?Standing balance support: No upper extremity supported, During functional activity ?Standing balance-Leahy Scale: Fair ?Standing balance comment: can stand and walk without UE support ?  ?  ?  ?  ?  ?  ?  ?  ?  ?  ?  ?   ? ? ? ?Pertinent Vitals/Pain Pain Assessment ?Pain Assessment: Faces ?Faces Pain Scale: Hurts little more ?Pain Location: LLE ?Pain Descriptors / Indicators: Discomfort, Grimacing, Guarding ?  Pain Intervention(s): Monitored during session, Limited activity within patient's tolerance, Premedicated before session  ? ? ?Home Living Family/patient expects to be discharged to:: Private residence ?Living Arrangements: Alone ?Available Help at Discharge: Friend(s);Available PRN/intermittently ?Type of Home:  Apartment ?Home Access: Stairs to enter ?  ?Entrance Stairs-Number of Steps: 1 ?  ?Home Layout: One level ?Home Equipment: None ?Additional Comments: pt adamant she only has herself to rely on, but ultimately has a friend she could call if needed  ?  ?Prior Function Prior Level of Function : Independent/Modified Independent ?  ?  ?  ?  ?  ?  ?  ?  ?  ? ? ?Hand Dominance  ?   ? ?  ?Extremity/Trunk Assessment  ? Upper Extremity Assessment ?Upper Extremity Assessment: Overall WFL for tasks assessed ?  ? ?Lower Extremity Assessment ?Lower Extremity Assessment: LLE deficits/detail ?LLE Deficits / Details: s/p L lower leg debridement with wound vac; hip and knee functionally at least 3/5 throughout ?LLE: Unable to fully assess due to pain ?  ? ?   ?Communication  ? Communication: No difficulties  ?Cognition Arousal/Alertness: Awake/alert ?Behavior During Therapy: [facility] for tasks assessed/performed ?Overall Cognitive Status: Within Functional Limits for tasks assessed ?  ?  ?  ?  ?  ?  ?  ?  ?  ?  ?  ?  ?  ?  ?  ?  ?General Comments: Pt repeating, "Oh I know how to not hurt" ?  ?  ? ?  ?General Comments General comments (skin integrity, edema, etc.): further activity limited by pt lethargic at end of session (suspect related to pain meds; pt reports receiving dilaudid prior to session), falling asleep during educ on therex; will reinforce next session ? ?  ?Exercises    ? ?Assessment/Plan  ?  ?PT Assessment Patient needs continued PT services  ?PT Problem List Decreased strength;Decreased range of motion;Decreased activity tolerance;Decreased balance;Decreased mobility;Decreased knowledge of use of DME;Decreased knowledge of precautions;Pain ? ?   ?  ?PT Treatment Interventions DME instruction;Gait training;Stair training;Functional mobility training;Therapeutic activities;Therapeutic exercise;Balance training;Patient/family education   ? ?PT Goals (Current goals can be found in the Care Plan section)  ?Acute Rehab PT  Goals ?Patient Stated Goal: Hope asap ?PT Goal Formulation: With patient ?Time For Goal Achievement: 05/09/21 ?Potential to Achieve Goals: Good ? ?  ?Frequency Min 5X/week ?  ? ? ?Co-evaluation   ?  ?  ?  ?  ? ? ?  ?AM-PAC PT "6 Clicks" Mobility  ?Outcome Measure Help needed turning from your back to your side while in a flat bed without using bedrails?: None ?Help needed moving from lying on your back to sitting on the side of a flat bed without using bedrails?: None ?Help needed moving to and from a bed to a chair (including a wheelchair)?: A Little ?Help needed standing up from a chair using your arms (e.g., wheelchair or bedside chair)?: A Little ?Help needed to walk in hospital room?: A Little ?Help needed climbing 3-5 steps with a railing? : A Little ?6 Click Score: 20 ? ?  ?End of Session   ?Activity Tolerance: Patient tolerated treatment well;Patient limited by fatigue ?Patient left: in chair;with call bell/phone within reach;with chair alarm set ?Nurse Communication: Mobility status ?PT Visit Diagnosis: Other abnormalities of gait and mobility (R26.89);Pain ?Pain - Right/Left: Left ?Pain - part of body: Leg ?  ? ?Time: 1443-1540 ?PT Time Calculation (min) (ACUTE ONLY): 19 min ? ? ?Charges:   PT Evaluation ?$  PT Eval Low Complexity: 1 Low ?  ?  ?   ? ?[name], PT, DPT ?Acute Rehabilitation Services  ?Pager [phone] ?Office [phone] ? ?[name] ?04/25/2021, 12:50 PM ? ?

## 2021-04-25 NOTE — Progress Notes (Signed)
Patient ID: [name], female   DOB: 1965/03/06, 56 y.o.   MRN: [id number] ?Postoperative day 1 debridement ulcer left calf.  There is no drainage in the wound VAC canister and cultures are pending.  There was necrotic fascia and gas in the soft tissue concerning for possible necrotizing fasciitis.  Do not anticipate further surgery. ?

## 2021-04-25 NOTE — Progress Notes (Signed)
Mobility Specialist: Progress Note ? ? 04/25/21 1625  ?Mobility  ?Activity Ambulated with assistance in hallway  ?Level of Assistance Standby assist, set-up cues, supervision of patient - no hands on  ?Assistive Device  ?(IV pole)  ?LLE Weight Bearing WBAT  ?Distance Ambulated (ft) 196 ft  ?Activity Response Tolerated well  ?$Mobility charge 1 Mobility  ? ?Received pt in bed having no complaints and agreeable to mobility. Asymptomatic throughout ambulation, returned back to bed w/ call bell in reach and all needs met. ? ?[name] ?Mobility Specialist ?Mobility Specialist [place]: [phone] ?Mobility Specialist [place]: [phone] ? ?

## 2021-04-25 NOTE — Plan of Care (Signed)

## 2021-04-26 LAB — CBC WITH DIFFERENTIAL/PLATELET
Abs Immature Granulocytes: 0.12 10*3/uL — ABNORMAL HIGH (ref 0.00–0.07)
Basophils Absolute: 0 10*3/uL (ref 0.0–0.1)
Basophils Relative: 0 %
Eosinophils Absolute: 0.1 10*3/uL (ref 0.0–0.5)
Eosinophils Relative: 1 %
HCT: 30.2 % — ABNORMAL LOW (ref 36.0–46.0)
Hemoglobin: 9.9 g/dL — ABNORMAL LOW (ref 12.0–15.0)
Immature Granulocytes: 1 %
Lymphocytes Relative: 16 %
Lymphs Abs: 2.1 10*3/uL (ref 0.7–4.0)
MCH: 29.9 pg (ref 26.0–34.0)
MCHC: 32.8 g/dL (ref 30.0–36.0)
MCV: 91.2 fL (ref 80.0–100.0)
Monocytes Absolute: 0.9 10*3/uL (ref 0.1–1.0)
Monocytes Relative: 7 %
Neutro Abs: 10 10*3/uL — ABNORMAL HIGH (ref 1.7–7.7)
Neutrophils Relative %: 75 %
Platelets: 397 10*3/uL (ref 150–400)
RBC: 3.31 MIL/uL — ABNORMAL LOW (ref 3.87–5.11)
RDW: 15.1 % (ref 11.5–15.5)
WBC: 13.4 10*3/uL — ABNORMAL HIGH (ref 4.0–10.5)
nRBC: 0.1 % (ref 0.0–0.2)

## 2021-04-26 LAB — BASIC METABOLIC PANEL
Anion gap: 7 (ref 5–15)
BUN: 17 mg/dL (ref 6–20)
CO2: 24 mmol/L (ref 22–32)
Calcium: 8.4 mg/dL — ABNORMAL LOW (ref 8.9–10.3)
Chloride: 105 mmol/L (ref 98–111)
Creatinine, Ser: 1.21 mg/dL — ABNORMAL HIGH (ref 0.44–1.00)
GFR, Estimated: 53 mL/min — ABNORMAL LOW (ref >60)
Glucose, Bld: 359 mg/dL — ABNORMAL HIGH (ref 70–99)
Potassium: 4.8 mmol/L (ref 3.5–5.1)
Sodium: 136 mmol/L (ref 135–145)

## 2021-04-26 LAB — GLUCOSE, CAPILLARY
Glucose-Capillary: 296 mg/dL — ABNORMAL HIGH (ref 70–99)
Glucose-Capillary: 285 mg/dL — ABNORMAL HIGH (ref 70–99)

## 2021-04-26 MED ORDER — METFORMIN HCL 500 MG PO TABS
500.0000 mg | ORAL_TABLET | Freq: Two times a day (BID) | ORAL | Status: DC
  Administered 2021-04-26 – 2021-04-30 (×9): 500 mg via ORAL
  Filled 2021-04-26 (×10): qty 1

## 2021-04-26 MED ORDER — INSULIN REGULAR HUMAN 100 UNIT/ML IJ SOLN
0.0000 [IU] | Freq: Three times a day (TID) | INTRAMUSCULAR | Status: DC
  Administered 2021-04-26 (×2): 5 [IU] via SUBCUTANEOUS
  Administered 2021-04-27 – 2021-04-28 (×2): 1 [IU] via SUBCUTANEOUS
  Administered 2021-04-29: 2 [IU] via SUBCUTANEOUS
  Administered 2021-04-29: 3 [IU] via SUBCUTANEOUS
  Administered 2021-04-30: 2 [IU] via SUBCUTANEOUS
  Administered 2021-04-30 – 2021-05-02 (×3): 3 [IU] via SUBCUTANEOUS
  Administered 2021-05-02 (×2): 5 [IU] via SUBCUTANEOUS
  Administered 2021-05-03: 7 [IU] via SUBCUTANEOUS
  Filled 2021-04-26: qty 3
  Filled 2021-04-26 (×2): qty 10

## 2021-04-26 MED ORDER — INSULIN GLARGINE-YFGN 100 UNIT/ML ~~LOC~~ SOLN
20.0000 [IU] | Freq: Every day | SUBCUTANEOUS | Status: DC
  Administered 2021-04-26 – 2021-05-02 (×6): 20 [IU] via SUBCUTANEOUS
  Filled 2021-04-26 (×8): qty 0.2

## 2021-04-26 MED ORDER — INSULIN GLARGINE-YFGN 100 UNIT/ML ~~LOC~~ SOLN
30.0000 [IU] | Freq: Every day | SUBCUTANEOUS | Status: DC
  Filled 2021-04-26: qty 0.3

## 2021-04-26 MED ORDER — NON FORMULARY: 0.0000 [IU] | Freq: Three times a day (TID) | Status: DC

## 2021-04-26 MED ORDER — HYDROMORPHONE HCL 1 MG/ML IJ SOLN
0.5000 mg | INTRAMUSCULAR | Status: DC | PRN
  Administered 2021-04-26 – 2021-04-30 (×11): 0.5 mg via INTRAVENOUS
  Filled 2021-04-26 (×11): qty 0.5

## 2021-04-26 NOTE — Progress Notes (Signed)
Physical Therapy Treatment ?Patient Details ?Name: [name] ?MRN: [id number] ?DOB: 09/10/1965 ?Today's Date: 04/26/2021 ? ? ?History of Present Illness Pt is a 56 y.o. female admitted 04/22/21 with LLE cellulitis and abcess not responding to a course of oral doxycycline as an outpatient. S/p LLE debridement 3/15. PMH includes CAD, CKD III, DM2, HTN, HLD, cardiomyopathy, lumbar stenosis, anxiety. ? ?  ?PT Comments  ? ? Patient is progressing towards goals. Pt requiring min guard to min A with transfers, ambulation, and stairs.  Pt required min A with stair navigation due to unsteadiness; will continue to practice during acute stay. Pt impulsive and lacked safety awareness during ambulation, even with verbal cuing. Pt practiced and provided with HEP handout. Pt will benefit from continued skilled PT to increase pt safety and independence with functional mobility. ?   ?Recommendations for follow up therapy are one component of a multi-disciplinary discharge planning process, led by the attending physician.  Recommendations may be updated based on patient status, additional functional criteria and insurance authorization. ? ?Follow Up Recommendations ? No PT follow up ?  ?  ?Assistance Recommended at Discharge Frequent or constant Supervision/Assistance  ?Patient can return home with the following A little help with walking and/or transfers;Assist for transportation;Assistance with cooking/housework;Direct supervision/assist for medications management;Help with stairs or ramp for entrance ?  ?Equipment Recommendations ? Rolling walker (2 wheels)  ?  ?Recommendations for Other Services   ? ? ?  ?Precautions / Restrictions Precautions ?Precautions: Fall;Other (comment) ?Precaution Comments: LLE wound vac ?Restrictions ?Weight Bearing Restrictions: Yes ?LLE Weight Bearing: Weight bearing as tolerated  ?  ? ?Mobility ? Bed Mobility ?Overal bed mobility: Independent ?  ?  ?  ?  ?  ?  ?  ?  ? ?Transfers ?Overall  transfer level: Needs assistance ?Equipment used: Rolling walker (2 wheels) ?Transfers: Sit to/from Stand ?Sit to Stand: Min guard ?  ?  ?  ?  ?  ?General transfer comment: using RW, required min guard A to steady and for balance in standing. Pt impulsive and required safety cues from SPT prior to transferring. ?  ? ?Ambulation/Gait ?Ambulation/Gait assistance: Min guard, Min assist ?Gait Distance (Feet): 200 Feet ?Assistive device: Rolling walker (2 wheels), IV Pole ?Gait Pattern/deviations: Step-through pattern, Decreased stride length, Decreased dorsiflexion - right, Decreased dorsiflexion - left, Trunk flexed, Narrow base of support, Antalgic ?Gait velocity: decreased ?Gait velocity interpretation: 1.31 - 2.62 ft/sec, indicative of limited community ambulator ?  ?General Gait Details: antalgic gait with RW and initial min guard A for safety and steadying. Pt had decreased heel strike bilaterally and required cues to improve gait mechanics with decreased carryover. Pt veering left with ambulation and repeatedly running walker into yellow, wet floor signs in hallway, even with verbal cues to adjust. As progressed, pt required min A with walking with mild shakiness in BLEs. She had a mild forward trunk lean onto RW near the end of mobility due to fatigue and needed cues to correct. Inconsistent response to verbal cues during mobility. Pt also unable to center RW with mobility, picking up the RW off the ground to adjust instead of moving her body laterally. ? ? ?Stairs ?Stairs: Yes ?Stairs assistance: Min assist ?Stair Management: No rails, Alternating pattern, Forwards ?Number of Stairs: 6 ?General stair comments: Pt required min A as pt was unsteady, especially with descending stairs with a step- through. Pt educated in slow stair training for safety with no UE support, since she has no railings at home.  Would benefit from further stair navigation with a step to pattern and use of an AD. ? ? ?Wheelchair Mobility ?   ? ?Modified Rankin (Stroke Patients Only) ?  ? ? ?  ?Balance Overall balance assessment: Needs assistance ?Sitting-balance support: Single extremity supported, Feet supported ?Sitting balance-Leahy Scale: Good ?Sitting balance - Comments: pt did require single UE support with sitting and min guard for safety since pt impulsive with mobility. ?  ?Standing balance support: Reliant on assistive device for balance, During functional activity, Bilateral upper extremity supported ?Standing balance-Leahy Scale: Poor ?Standing balance comment: Pt required BUE support using RW with ambulation and secondary support at times due to unsteadiness. ?  ?  ?  ?  ?  ?  ?  ?  ?  ?  ?  ?  ? ?  ?Cognition Arousal/Alertness: Suspect due to medications ?Behavior During Therapy: [facility] for tasks assessed/performed ?Overall Cognitive Status: No family/caregiver present to determine baseline cognitive functioning ?  ?  ?  ?  ?  ?  ?  ?  ?  ?  ?  ?  ?  ?  ?  ?  ?General Comments: pt mumbling during session and groggy during session. Pt impulsive. She needed repeated cues for safety with mobility tasks. Pt decreased safety awareness with ambulation, repeatedly running walker into bright yellow wet floor signs in hallway, even with verbal cues to adjust. ?  ?  ? ?  ?Exercises Total Joint Exercises ?Ankle Circles/Pumps: Both, 10 reps, Supine ?Heel Slides: AROM, 5 reps, Both, Seated ?Straight Leg Raises: 5 reps, Left, Supine ? ?  ?General Comments General comments (skin integrity, edema, etc.): pt groggy during session and decreased safety awareness throughout. HEP handout provided. ?  ?  ? ?Pertinent Vitals/Pain Pain Assessment ?Pain Assessment: 0-10 ?Pain Score: 9  ?Pain Location: LLE ?Pain Descriptors / Indicators: Discomfort, Grimacing ?Pain Intervention(s): Monitored during session  ? ? ?Home Living   ?  ?  ?  ?  ?  ?  ?  ?  ?  ?   ?  ?Prior Function    ?  ?  ?   ? ?PT Goals (current goals can now be found in the care plan section) Acute  Rehab PT Goals ?Patient Stated Goal: to go home ?PT Goal Formulation: With patient ?Time For Goal Achievement: 05/09/21 ?Potential to Achieve Goals: Good ?Progress towards PT goals: Progressing toward goals ? ?  ?Frequency ? ? ? Min 5X/week ? ? ? ?  ?PT Plan Current plan remains appropriate  ? ? ?Co-evaluation   ?  ?  ?  ?  ? ?  ?AM-PAC PT "6 Clicks" Mobility   ?Outcome Measure ? Help needed turning from your back to your side while in a flat bed without using bedrails?: None ?Help needed moving from lying on your back to sitting on the side of a flat bed without using bedrails?: None ?Help needed moving to and from a bed to a chair (including a wheelchair)?: A Little ?Help needed standing up from a chair using your arms (e.g., wheelchair or bedside chair)?: A Little ?Help needed to walk in hospital room?: A Little ?Help needed climbing 3-5 steps with a railing? : A Lot ?6 Click Score: 19 ? ?  ?End of Session Equipment Utilized During Treatment: Gait belt ?Activity Tolerance: Patient tolerated treatment well ?Patient left: in bed;with call bell/phone within reach ?Nurse Communication: Mobility status ?PT Visit Diagnosis: Unsteadiness on feet (R26.81);Other abnormalities of gait and mobility (  R26.89);Pain ?Pain - Right/Left: Left ?Pain - part of body: Leg ?  ? ? ?Time: 1020-1056 ?PT Time Calculation (min) (ACUTE ONLY): 36 min ? ?Charges:  $Gait Training: 23-37 mins          ?          ? ?Jonne Ply, SPT  ? ? ?Jonne Ply ?04/26/2021, 12:21 PM ? ?

## 2021-04-26 NOTE — Progress Notes (Signed)
Patient ID: [name], female   DOB: 06-Apr-1965, 56 y.o.   MRN: [id number] ?Patient is seen in follow-up status post debridement abscess and necrotic fascia left leg.  Patient's wound cultures are showing gram-positive cocci in clusters.  Anticipate she will need 3 to 4 weeks of antibiotic coverage.  No drainage in the wound VAC canister. ?

## 2021-04-26 NOTE — Plan of Care (Signed)

## 2021-04-26 NOTE — Progress Notes (Addendum)
?PROGRESS NOTE ? ?[name]  ?DOB: 1965/06/01  ?PCP: Mackie Pai, PA-C ?RUE:[id number]  ?DOA: 04/22/2021 ? LOS: 4 days  ?Hospital Day: 5 ? ?Brief narrative: ?[name] is a 56 y.o. female with PMH significant for DM2, HTN, HLD, CAD, cardiomyopathy, CVA, CKD, diabetic gastroparesis, diabetic neuropathy, GERD, depression ?Patient presented to the ED on 3/13 with complaint of left leg cellulitis not responding to a course of oral doxycycline as an outpatient. ?It started as a small swelling and blister that noted on 3/9.  It enlarged and the blister ruptured on 3/11. ? ?In the ED, patient was hemodynamically stable, blood pressure in 90s ?Labs with sodium 131, glucose 310, BUN/creatinine 23/1.59, lactic acid elevated to 2.3 and later worsened to 3.4, unremarkable CBC ?Chest x-ray unremarkable ?Blood culture was sent ?EKG with normal sinus rhythm ?Patient was given IV antibiotics, IV fluid, IV pain medicine ?Admitted to hospitalist service for further evaluation management ?Seen by orthopedics. ?3/15, underwent left leg excisional debridement and wound VAC placement. ? ?Subjective: ?Patient was seen and examined this morning.  ?Taking her breakfast.  She was eating potato chips and had multiple packets of them by her bedside. ?Pain controlled. ?Blood sugar level elevated to 26 this morning. ? ?Principal Problem: ?  Cellulitis of left leg ?Active Problems: ?  Sepsis due to cellulitis [facility]) ?  Severe sepsis ([place]) ?  Chest pain, unspecified ?  Acute kidney injury superimposed on chronic kidney disease ([place]) ?  Uncontrolled type 2 diabetes mellitus with hyperglycemia, without long-term current use of insulin ([place]) ?  Dyslipidemia ?  Abscess of left lower leg ?  Necrotizing fasciitis ([place]) ?  ?Assessment and Plan: ?Sepsis -POA ?Left leg cellulitis and abscess ?Necrotizing fasciitis ?-Presented with worsening cellulitis of left leg with central pus collection. ?-Orthopedic consult appreciated. ?-3/15,  underwent left leg excisional debridement and wound VAC placement.  Per orthopedics note, there was necrotic fascia and gas in the soft tissue concerning for possible necrotizing fasciitis. ?-Wound cultures growing gram-positive cocci in clusters.  Pending final identification and sensitivity ?-Currently on IV cefepime and IV vancomycin.  3/17, I checked with Dr. Sharol Given.  He anticipates 3 to 4 weeks of antibiotic coverage, oral antibiotic would be appropriate as he states the wound bed was healthy muscle. ?-Sepsis physiology improving. ?-Adequately hydrated.  Stop IV fluid today ?-Pain management: Scheduled Percocet, as needed IV Dilaudid.  Minimize use of IV Dilaudid ?-WBC count up today.  No fever.  Continue to trend labs.  Repeat lactic acid level tomorrow. ?Recent Labs  ?Lab 04/19/21 ?1616 04/22/21 ?1244 04/22/21 ?1447 04/23/21 ?1478 04/24/21 ?2956 04/25/21 ?0200 04/26/21 ?0232  ?WBC  --  9.9  --  8.1 9.5 8.0 13.4*  ?LATICACIDVEN 1.2 2.3* 3.4*  --   --   --   --   ?PROCALCITON  --   --   --  0.52  --   --   --   ? ?AKI on CKD2 ?-Creatinine elevated on admission probably because of sepsis.  Improving on IV fluid.  Continue to monitor. ?Recent Labs  ?  08/24/20 ?1000 01/17/21 ?1120 04/03/21 ?1431 04/19/21 ?1611 04/22/21 ?1244 04/23/21 ?2130 04/24/21 ?8657 04/25/21 ?0200 04/26/21 ?0232  ?BUN '19 20 19 '$ 21* 23* 14 12 25* 17  ?CREATININE 0.94 1.23* 1.05 1.54* 1.59* 1.15* 1.21* 1.08* 1.21*  ? ?Uncontrolled type 2 diabetes mellitus ?hyperglycemia and hypoglycemia ?Diabetic neuropathy ?-Patient had a significant worsening of A1c from 7.9 on July 2022 to 12.3 on 04/13/2021. ?-Home meds include  Tresiba 40 units daily, metformin. ?-It seems patient refused Semglee last night.  She states she is allergic to NovoLog was given her swelling.  Blood sugar level running elevated, 296 this morning. ?-I switched the timing of Semglee to be given in the a.m. starting today.  Patient states she is allergic to NovoLog and aspart and has  been refusing it.  Resume metformin today ?-We will try to see if pharmacy can start her on regular insulin sliding scale. ?-Currently on carb controlled diet.  Encouraged to improve compliance with that. ?-Continue gabapentin. ?Recent Labs  ?Lab 04/24/21 ?1837 04/24/21 ?1954 04/25/21 ?1103 04/25/21 ?1530 04/26/21 ?0728  ?GLUCAP 102* [place] ?Chronic diastolic CHF ?Essential hypertension ?-3/14 echocardiogram with EF 60 to 65%, no wall motion abnormality.   ?-Home meds include carvedilol 25 mg twice daily, amlodipine 10 mg daily Aldactone 25 mg daily, ?-Continue Coreg and amlodipine.  Aldactone remains on hold. ?-Not on diuretics. ? ?History of CVA  ?Dyslipidemia ?-Continue aspirin and statin  ? ?Goals of care ?  Code Status: Full Code  ? ? ?Mobility: Encourage ambulation ? ?Nutritional status:  ?Body mass index is 22.92 kg/m?.  ?  ?  ? ? ? ? ?Diet:  ?Diet Order   ? ?       ?  Diet Carb Modified Fluid consistency: Thin; Room service appropriate? Yes  Diet effective now       ?  ? ?  ?  ? ?  ? ? ?DVT prophylaxis:  ?SCDs Start: 04/24/21 1810 ?enoxaparin (LOVENOX) injection 40 mg Start: 04/22/21 2230 ?  ?Antimicrobials: IV cefepime and IV vancomycin ?Fluid: Stop IV fluid today ?Consultants: Orthopedics ?Family Communication: None at bedside ? ?Status is: Inpatient ? ?Continue in-hospital care because: POD 2, ongoing management of sepsis and wound ?Level of care: Med-Surg  ? ?Dispo: The patient is from: Home ?             Anticipated d/c is to: PT eval obtained on 3/16.  No follow-up recommended. ?             Patient currently is not medically stable to d/c. ?  Difficult to place patient No ? ? ? ? ?Infusions:  ? ceFEPime (MAXIPIME) IV 2 g (04/26/21 0953)  ? methocarbamol (ROBAXIN) IV    ? vancomycin Stopped (04/25/21 1416)  ? ? ?Scheduled Meds: ? amLODipine  10 mg Oral Daily  ? aspirin EC  81 mg Oral Daily  ? atorvastatin  80 mg Oral Daily  ? carvedilol  25 mg Oral BID WC  ? cholecalciferol  400 Units  Oral Daily  ? docusate sodium  100 mg Oral BID  ? enoxaparin (LOVENOX) injection  40 mg Subcutaneous Q24H  ? famotidine  20 mg Oral Daily  ? fluticasone  2 spray Each Nare Daily  ? fluticasone furoate-vilanterol  1 puff Inhalation Daily  ? gabapentin  600 mg Oral TID  ? insulin aspart  0-9 Units Subcutaneous TID AC & HS  ? insulin glargine-yfgn  20 Units Subcutaneous Daily  ? metFORMIN  500 mg Oral BID WC  ? oxyCODONE-acetaminophen  1 tablet Oral Q6H  ? sertraline  100 mg Oral Daily  ? topiramate  50 mg Oral BID  ? ? ?PRN meds: ?acetaminophen, albuterol, bisacodyl, busPIRone, HYDROmorphone (DILAUDID) injection, magnesium hydroxide, methocarbamol **OR** methocarbamol (ROBAXIN) IV, metoCLOPramide **OR** metoCLOPramide (REGLAN) injection, nitroGLYCERIN, ondansetron **OR** ondansetron (ZOFRAN) IV, oxyCODONE, oxyCODONE, polyethylene glycol, promethazine, SUMAtriptan, traZODone  ? ?Antimicrobials: ?Anti-infectives (From  admission, onward)  ? ? Start     Dose/Rate Route Frequency Ordered Stop  ? 04/24/21 1200  vancomycin (VANCOREADY) IVPB 1500 mg/300 mL       ? 1,500 mg ?150 mL/hr over 120 Minutes Intravenous Every 24 hours 04/23/21 1455    ? 04/24/21 0600  ceFAZolin (ANCEF) IVPB 2g/100 mL premix       ? 2 g ?200 mL/hr over 30 Minutes Intravenous On call to O.R. 04/23/21 1928 04/24/21 1650  ? 04/23/21 1200  vancomycin (VANCOREADY) IVPB 1250 mg/250 mL  Status:  Discontinued       ? 1,250 mg ?166.7 mL/hr over 90 Minutes Intravenous Every 24 hours 04/22/21 1342 04/23/21 1455  ? 04/23/21 1000  ceFEPIme (MAXIPIME) 2 g in sodium chloride 0.9 % 100 mL IVPB       ? 2 g ?200 mL/hr over 30 Minutes Intravenous Every 12 hours 04/22/21 2149    ? 04/22/21 2245  metroNIDAZOLE (FLAGYL) tablet 500 mg  Status:  Discontinued       ? 500 mg Oral Every 12 hours 04/22/21 2155 04/22/21 2257  ? 04/22/21 2230  ceFEPIme (MAXIPIME) 2 g in sodium chloride 0.9 % 100 mL IVPB       ? 2 g ?200 mL/hr over 30 Minutes Intravenous  Once 04/22/21 2135  04/22/21 2314  ? 04/22/21 2230  metroNIDAZOLE (FLAGYL) IVPB 500 mg  Status:  Discontinued       ? 500 mg ?100 mL/hr over 60 Minutes Intravenous Every 12 hours 04/22/21 2135 04/22/21 2155  ? 04/22/21 2230  vancomyc

## 2021-04-26 NOTE — Progress Notes (Signed)
Inpatient Diabetes Program Recommendations ? ?AACE/ADA: New Consensus Statement on Inpatient Glycemic Control (2015) ? ?Target Ranges:  Prepandial:   less than 140 mg/dL ?     Peak postprandial:   less than 180 mg/dL (1-2 hours) ?     Critically ill patients:  140 - 180 mg/dL  ? ? Latest Reference Range & Units 04/25/21 11:03 04/25/21 15:30  ?Glucose-Capillary 70 - 99 mg/dL 343 (H) ? ?REFUSED Novolog 289 (H) ? ?REFUSED Novolog ? ?REFUSED Semglee  ? ? ?Home DM Meds: Tresiba 40 units QHS ?      Metformin 500 mg daily ? ?Current Orders: Semglee 20 units QHS  ?Novolog 0-9 units TID ac/h ? ? ?Hypoglycemic 3/15--Reduced the Semglee to 20 units (was 40 units) ? ?Got Decadron 4 mg X 1 dose 4pm on 3/15 and CBGs elevated 3/16 ? ? ? ?Pt Refused Novolog all day yest and Refused Semglee last PM ? ?Allergy listed in chart to Insulin Aspart (Novolog) ? ?Spoke with Dr. Pietro Cassis by phone to discuss the above.  Dr. Pietro Cassis planning to change the timing of the Semglee to AM so pt will have dose on board today since refused Semglee last PM.  Also discussed stopping the Novolog and starting Regular Insulin as a sliding scale.  Called Pharmacy to discuss.  Orders entered with Dr. Judie Bonus permission for Regular Insulin as a sliding scale (see admin notes for the scale instructions).  [place] to RN caring for pt to alert her to new Regular insulin orders as well. ? ? ? ?--Will follow patient during hospitalization-- ? ?Wyn Quaker RN, MSN, CDE ?Diabetes Coordinator ?Inpatient Glycemic Control Team ?Team Pager: [phone] (8a-5p) ? ? ?

## 2021-04-26 NOTE — Progress Notes (Signed)
Pharmacy Antibiotic Note ? ?[name] is a 56 y.o. female admitted on 04/22/2021 with LLE cellulitis, abscess and necrotizing fasciitis  Pharmacy has been consulted for Vancomycin and Cefepime dosing. ? ?Creatinine improved to 1.21 ? ?Abscess cultures growing GPC in clusters (staph?) ? ?Plan: ?Continue Vancomycin 1500 mg IV q24hr. ?Goal AUC 400-550. ?Expected AUC: 458 ?SCr used: 1.15 ?Cefepime 2gm IV q12h remains appropriate. ?Follow renal function, culture data, clinical progress and antibiotic plans. ? ? ?Height: 6' (182.9 cm) ?Weight: 76.7 kg (169 lb) ?IBW/kg (Calculated) : 73.1 ? ?Temp (24hrs), Avg:98 ?F (36.7 ?C), Min:97.5 ?F (36.4 ?C), Max:98.3 ?F (36.8 ?C) ? ?Recent Labs  ?Lab 04/19/21 ?1616 04/22/21 ?1244 04/22/21 ?1447 04/23/21 ?4496 04/24/21 ?7591 04/25/21 ?0200 04/26/21 ?0232  ?WBC  --  9.9  --  8.1 9.5 8.0 13.4*  ?CREATININE  --  1.59*  --  1.15* 1.21* 1.08* 1.21*  ?LATICACIDVEN 1.2 2.3* 3.4*  --   --   --   --   ? ?  ?Estimated Creatinine Clearance: 59.9 mL/min (A) (by C-G formula based on SCr of 1.21 mg/dL (H)).   ? ?Allergies  ?Allergen Reactions  ? Insulin Aspart Other (See Comments) and Swelling  ?  adverse reaction to Novolog ?  ? Latex Itching and Rash  ? Morphine Itching and Rash  ? ? ?Antimicrobials this admission: ?Vancomycin 3/13>> ?Cefepime 3/13>> ?Metronidazole PO x 1 on 3/13 ? ?Dose adjustments this admission: ?3/14: empiric Vanc increase 1250 mg to 1500 mg IV q24h for improved creatinine ? ?Microbiology results: ?3/13: blood: no growth < 24 hours to date ?3/15 Tissue: GPC in clusters ? ?Stephan Draughn A. Levada Dy, PharmD, BCPS, FNKF ?Clinical Pharmacist ?[place] ?Please utilize Am[facility] for appropriate phone number to reach the unit pharmacist ([place]) ? ?04/26/2021 8:30 AM ? ?

## 2021-04-26 NOTE — Progress Notes (Signed)
Mobility Specialist: Progress Note ? ? 04/26/21 1631  ?Mobility  ?Activity Ambulated with assistance in hallway  ?Level of Assistance Standby assist, set-up cues, supervision of patient - no hands on  ?Assistive Device  ?(IV pole)  ?LLE Weight Bearing WBAT  ?Distance Ambulated (ft) 360 ft  ?Activity Response Tolerated well  ?$Mobility charge 1 Mobility  ? ?Received pt in bed sleeping, easily awakened and agreeable to mobility. Asymptomatic throughout ambulation, returned back to bed w/ call bell in reach and all needs met. ? ?Harrell Gave Virdia Ziesmer ?Mobility Specialist ?Mobility Specialist [place]: [phone] ?Mobility Specialist [place]: [phone] ? ?

## 2021-04-27 LAB — CBC WITH DIFFERENTIAL/PLATELET
Abs Immature Granulocytes: 0.14 10*3/uL — ABNORMAL HIGH (ref 0.00–0.07)
Basophils Absolute: 0 10*3/uL (ref 0.0–0.1)
Basophils Relative: 0 %
Eosinophils Absolute: 0.3 10*3/uL (ref 0.0–0.5)
Eosinophils Relative: 3 %
HCT: 31.1 % — ABNORMAL LOW (ref 36.0–46.0)
Hemoglobin: 9.8 g/dL — ABNORMAL LOW (ref 12.0–15.0)
Immature Granulocytes: 1 %
Lymphocytes Relative: 25 %
Lymphs Abs: 2.8 10*3/uL (ref 0.7–4.0)
MCH: 29.1 pg (ref 26.0–34.0)
MCHC: 31.5 g/dL (ref 30.0–36.0)
MCV: 92.3 fL (ref 80.0–100.0)
Monocytes Absolute: 1.2 10*3/uL — ABNORMAL HIGH (ref 0.1–1.0)
Monocytes Relative: 11 %
Neutro Abs: 6.5 10*3/uL (ref 1.7–7.7)
Neutrophils Relative %: 60 %
Platelets: 436 10*3/uL — ABNORMAL HIGH (ref 150–400)
RBC: 3.37 MIL/uL — ABNORMAL LOW (ref 3.87–5.11)
RDW: 15.4 % (ref 11.5–15.5)
WBC: 10.9 10*3/uL — ABNORMAL HIGH (ref 4.0–10.5)
nRBC: 0.5 % — ABNORMAL HIGH (ref 0.0–0.2)

## 2021-04-27 LAB — BASIC METABOLIC PANEL
Anion gap: 10 (ref 5–15)
BUN: 13 mg/dL (ref 6–20)
CO2: 22 mmol/L (ref 22–32)
Calcium: 8.5 mg/dL — ABNORMAL LOW (ref 8.9–10.3)
Chloride: 109 mmol/L (ref 98–111)
Creatinine, Ser: 1.08 mg/dL — ABNORMAL HIGH (ref 0.44–1.00)
GFR, Estimated: >60 mL/min (ref >60)
Glucose, Bld: 164 mg/dL — ABNORMAL HIGH (ref 70–99)
Potassium: 4.3 mmol/L (ref 3.5–5.1)
Sodium: 141 mmol/L (ref 135–145)

## 2021-04-27 LAB — CULTURE, BLOOD (ROUTINE X 2)
Culture: NO GROWTH
Culture: NO GROWTH
Special Requests: ADEQUATE
Specimen Description: ADEQUATE

## 2021-04-27 LAB — GLUCOSE, CAPILLARY
Glucose-Capillary: 120 mg/dL — ABNORMAL HIGH (ref 70–99)
Glucose-Capillary: 119 mg/dL — ABNORMAL HIGH (ref 70–99)
Glucose-Capillary: 128 mg/dL — ABNORMAL HIGH (ref 70–99)
Glucose-Capillary: 173 mg/dL — ABNORMAL HIGH (ref 70–99)

## 2021-04-27 LAB — LACTIC ACID, PLASMA: Lactic Acid, Venous: 1.1 mmol/L (ref 0.5–1.9)

## 2021-04-27 NOTE — Progress Notes (Signed)
Mobility Specialist Progress Note  ? ? 04/27/21 1148  ?Mobility  ?Activity Ambulated with assistance in hallway  ?Level of Assistance Standby assist, set-up cues, supervision of patient - no hands on  ?Assistive Device  ?(hallway railings)  ?Distance Ambulated (ft) 350 ft  ?Activity Response Tolerated well  ?$Mobility charge 1 Mobility  ? ?Pt received in bed and agreeable. C/o 9/10 pain, RN notified. Left in bed with call bell in reach.  ? ?Hildred Alamin ?Mobility Specialist  ?  ?

## 2021-04-27 NOTE — Progress Notes (Signed)
?PROGRESS NOTE ? ? ? ?[name]  QVZ:[id number] DOB: 11-19-1965 DOA: 04/22/2021 ?PCP: Mackie Pai, PA-C  ? ?  ?Brief Narrative:  ?56 year old female with a history of type 2 diabetes, hypertension, hyperlipidemia, CAD, cardiomyopathy, CKD, GERD, and depression presenting on 3/13 with left leg cellulitis failing outpatient doxycycline.  She first noticed on 3/9 and enlarged on 3/11.  On 3/15, she underwent a left leg excisional debridement with wound VAC placement by the orthopedic surgery team. ? ? ?New events last 24 hours / Subjective: ?Sleeping upon my arrival.  Patient reports pain is still there.  She is not ambulating very much.  2 wheeled walker recommended.  Ortho recommends 3 to 4 weeks of antibiotics after discharge.  She required IV Dilaudid yesterday evening.  States the oral pain medicine has not been particularly helpful.  Appetite is fair.  No chest pain, shortness of breath, or fevers. ? ?Assessment & Plan: ?  ?Principal Problem: ?  Cellulitis of left leg ?Active Problems: ?  Dyslipidemia ?  Sepsis due to cellulitis [facility]) ?  Severe sepsis ([place]) ?  Acute kidney injury superimposed on chronic kidney disease ([place]) ?  Chest pain, unspecified ?  Uncontrolled type 2 diabetes mellitus with hyperglycemia, without long-term current use of insulin ([place]) ?  Abscess of left lower leg ?  Necrotizing fasciitis ([employer]) ? ?Cellulitis of left leg with abscess/possible necrotizing fasciitis; severe sepsis ? Appreciate orthopedic surgery ? Dilaudid as needed for severe pain ? Oxycodone as needed for moderate pain ? Will continue cefepime and vancomycin ? Has been afebrile, no leukocytosis, lactate is normalized ? Wound cultures pending, blood cultures show no growth to date ? Sepsis has resolved ? Robaxin 500 mg every 6 hours as needed ? ?Acute kidney injury superimposed on CKD ? Resolved, monitor BMP ? ?Uncontrolled type 2 diabetes with hyperglycemia and diabetic neuropathy ? Semglee 20 u in the morning,  metformin 500 mg twice daily restarted ? Continue gabapentin  ? Low-carb diet ? If refusing insulin moving forward, will consider moderate dose of a sulfonylurea like glyburide 5 mg twice daily as she is not insulin na?ve ? ?Dyslipidemia, CAD ? Continue Lipitor 80 mg daily ? Continue baby aspirin with history of CAD ? ?CHF/essential hypertension ? Continue Norvasc 10 mg daily, Coreg 25 mg twice daily ? ?Migraines/depression ? Continue Topamax 50 mg twice daily ? Continue Zoloft 100 mg daily, trazodone 25 mg at night as needed for sleep ? ?DVT prophylaxis: Lovenox ?Code Status: Full ?Family Communication: Self ?Coming From: Home  ?Disposition Plan: Home ?Barriers to Discharge: Clinical improvement ? ?Consultants:  ?Orthopedic surgery ? ?Procedures:  ?04/24/21-left excisional wound debridement and wound VAC placement ? ?Antimicrobials:  ?Anti-infectives (From admission, onward)  ? ? Start     Dose/Rate Route Frequency Ordered Stop  ? 04/24/21 1200  vancomycin (VANCOREADY) IVPB 1500 mg/300 mL       ? 1,500 mg ?150 mL/hr over 120 Minutes Intravenous Every 24 hours 04/23/21 1455    ? 04/24/21 0600  ceFAZolin (ANCEF) IVPB 2g/100 mL premix       ? 2 g ?200 mL/hr over 30 Minutes Intravenous On call to O.R. 04/23/21 1928 04/24/21 1650  ? 04/23/21 1200  vancomycin (VANCOREADY) IVPB 1250 mg/250 mL  Status:  Discontinued       ? 1,250 mg ?166.7 mL/hr over 90 Minutes Intravenous Every 24 hours 04/22/21 1342 04/23/21 1455  ? 04/23/21 1000  ceFEPIme (MAXIPIME) 2 g in sodium chloride 0.9 % 100 mL IVPB       ?  2 g ?200 mL/hr over 30 Minutes Intravenous Every 12 hours 04/22/21 2149    ? 04/22/21 2245  metroNIDAZOLE (FLAGYL) tablet 500 mg  Status:  Discontinued       ? 500 mg Oral Every 12 hours 04/22/21 2155 04/22/21 2257  ? 04/22/21 2230  ceFEPIme (MAXIPIME) 2 g in sodium chloride 0.9 % 100 mL IVPB       ? 2 g ?200 mL/hr over 30 Minutes Intravenous  Once 04/22/21 2135 04/22/21 2314  ? 04/22/21 2230  metroNIDAZOLE (FLAGYL) IVPB 500 mg   Status:  Discontinued       ? 500 mg ?100 mL/hr over 60 Minutes Intravenous Every 12 hours 04/22/21 2135 04/22/21 2155  ? 04/22/21 2230  vancomycin (VANCOCIN) IVPB 1000 mg/200 mL premix  Status:  Discontinued       ? 1,000 mg ?200 mL/hr over 60 Minutes Intravenous  Once 04/22/21 2135 04/22/21 2155  ? 04/22/21 1345  vancomycin (VANCOCIN) IVPB 1000 mg/200 mL premix       ? 1,000 mg ?200 mL/hr over 60 Minutes Intravenous  Once 04/22/21 1338 04/22/21 1506  ? ?  ? ? ? ?Objective: ?Vitals:  ? 04/26/21 1520 04/26/21 1949 04/27/21 0458 04/27/21 0830  ?BP: 103/72 100/71 123/88 123/89  ?Pulse: 81 80 90 94  ?Resp: '14 16 15 17  '$ ?Temp:  98 ?F (36.7 ?C) 98.4 ?F (36.9 ?C) 98.9 ?F (37.2 ?C)  ?TempSrc:  Oral Axillary Oral  ?SpO2: 94% 96% 99% 99%  ?Weight:      ?Height:      ? ? ?Intake/Output Summary (Last 24 hours) at 04/27/2021 0914 ?Last data filed at 04/26/2021 2019 ?Gross per 24 hour  ?Intake 240 ml  ?Output 0 ml  ?Net 240 ml  ? ?Filed Weights  ? 04/22/21 1219 04/22/21 1253  ?Weight: 76.7 kg 76.7 kg  ? ? ?Examination:  ?General exam: Appears calm and comfortable  ?Respiratory system: Clear to auscultation. Respiratory effort normal. No respiratory distress. No conversational dyspnea.  ?Cardiovascular system: S1 & S2 heard, RRR. No murmurs. No pedal edema on right, 1+ pitting around ankles on the left. ?Gastrointestinal system: Abdomen is nondistended, soft and nontender. Normal bowel sounds heard. ?Central nervous system: Alert and oriented. No focal neurological deficits. Speech clear.  ?Skin: Wound examination deferred to orthopedic surgery ?Psychiatry: Judgement and insight appear normal. Mood & affect appropriate.  ? ?Data Reviewed: I have personally reviewed following labs and imaging studies ? ?CBC: ?Recent Labs  ?Lab 04/23/21 ?0240 04/24/21 ?0208 04/25/21 ?0200 04/26/21 ?0232 04/27/21 ?0245  ?WBC 8.1 9.5 8.0 13.4* 10.9*  ?NEUTROABS  --  5.5 5.9 10.0* 6.5  ?HGB 10.6* 10.2* 9.4* 9.9* 9.8*  ?HCT 32.5* 31.3* 30.6* 30.2* 31.1*   ?MCV 89.8 91.8 89.2 91.2 92.3  ?PLT 279 296 201 397 436*  ? ?Basic Metabolic Panel: ?Recent Labs  ?Lab 04/23/21 ?0240 04/24/21 ?0208 04/25/21 ?0200 04/26/21 ?0232 04/27/21 ?0245  ?NA 138 139 137 136 141  ?K 3.8 4.2 4.2 4.8 4.3  ?CL 108 108 99 105 109  ?CO2 '22 22 25 24 22  '$ ?GLUCOSE 257* 128* 151* 359* 164*  ?BUN 14 12 25* 17 13  ?CREATININE 1.15* 1.21* 1.08* 1.21* 1.08*  ?CALCIUM 8.4* 8.2* 8.9 8.4* 8.5*  ? ?GFR: ?Estimated Creatinine Clearance: 67.1 mL/min (A) (by C-G formula based on SCr of 1.08 mg/dL (H)). ? ?Liver Function Tests: ?Recent Labs  ?Lab 04/22/21 ?1244  ?AST 31  ?ALT 35  ?ALKPHOS 87  ?BILITOT 0.7  ?PROT 8.3*  ?  ALBUMIN 3.3*  ? ?Coagulation Profile: ?Recent Labs  ?Lab 04/22/21 ?1244 04/23/21 ?0240  ?INR 1.0 1.0  ? ?CBG: ?Recent Labs  ?Lab 04/24/21 ?1954 04/25/21 ?1103 04/25/21 ?1530 04/26/21 ?0728 04/26/21 ?1129  ?GLUCAP 147* 343* 289* 296* 285*  ? ?Sepsis Labs: ?Recent Labs  ?Lab 04/22/21 ?1244 04/22/21 ?1447 04/23/21 ?0240 04/27/21 ?0245  ?PROCALCITON  --   --  0.52  --   ?LATICACIDVEN 2.3* 3.4*  --  1.1  ? ? ?Recent Results (from the past 240 hour(s))  ?Resp Panel by RT-PCR (Flu A&B, Covid) Nasopharyngeal Swab     Status: None  ? Collection Time: 04/19/21  4:01 PM  ? Specimen: Nasopharyngeal Swab; Nasopharyngeal(NP) swabs in vial transport medium  ?Result Value Ref Range Status  ? SARS Coronavirus 2 by RT PCR NEGATIVE NEGATIVE Final  ?  Comment: (NOTE) ?SARS-CoV-2 target nucleic acids are NOT DETECTED. ? ?The SARS-CoV-2 RNA is generally detectable in upper respiratory ?specimens during the acute phase of infection. The lowest ?concentration of SARS-CoV-2 viral copies this assay can detect is ?138 copies/mL. A negative result does not preclude SARS-Cov-2 ?infection and should not be used as the sole basis for treatment or ?other patient management decisions. A negative result may occur with  ?improper specimen collection/handling, submission of specimen other ?than nasopharyngeal swab, presence of  viral mutation(s) within the ?areas targeted by this assay, and inadequate number of viral ?copies(<138 copies/mL). A negative result must be combined with ?clinical observations, patient history, and epid

## 2021-04-28 ENCOUNTER — Inpatient Hospital Stay (HOSPITAL_COMMUNITY): Payer: Self-pay

## 2021-04-28 LAB — CBC WITH DIFFERENTIAL/PLATELET
Abs Immature Granulocytes: 0.08 10*3/uL — ABNORMAL HIGH (ref 0.00–0.07)
Basophils Absolute: 0 10*3/uL (ref 0.0–0.1)
Basophils Relative: 0 %
Eosinophils Absolute: 0.3 10*3/uL (ref 0.0–0.5)
Eosinophils Relative: 2 %
HCT: 32.3 % — ABNORMAL LOW (ref 36.0–46.0)
Hemoglobin: 10.4 g/dL — ABNORMAL LOW (ref 12.0–15.0)
Immature Granulocytes: 1 %
Lymphocytes Relative: 22 %
Lymphs Abs: 2.4 10*3/uL (ref 0.7–4.0)
MCH: 29.5 pg (ref 26.0–34.0)
MCHC: 32.2 g/dL (ref 30.0–36.0)
MCV: 91.8 fL (ref 80.0–100.0)
Monocytes Absolute: 1 10*3/uL (ref 0.1–1.0)
Monocytes Relative: 9 %
Neutro Abs: 7.2 10*3/uL (ref 1.7–7.7)
Neutrophils Relative %: 66 %
Platelets: 455 10*3/uL — ABNORMAL HIGH (ref 150–400)
RBC: 3.52 MIL/uL — ABNORMAL LOW (ref 3.87–5.11)
RDW: 15.4 % (ref 11.5–15.5)
WBC: 10.9 10*3/uL — ABNORMAL HIGH (ref 4.0–10.5)
nRBC: 0.3 % — ABNORMAL HIGH (ref 0.0–0.2)

## 2021-04-28 LAB — BASIC METABOLIC PANEL
Anion gap: 8 (ref 5–15)
BUN: 13 mg/dL (ref 6–20)
CO2: 24 mmol/L (ref 22–32)
Calcium: 8.5 mg/dL — ABNORMAL LOW (ref 8.9–10.3)
Chloride: 104 mmol/L (ref 98–111)
Creatinine, Ser: 0.97 mg/dL (ref 0.44–1.00)
GFR, Estimated: >60 mL/min (ref >60)
Glucose, Bld: 126 mg/dL — ABNORMAL HIGH (ref 70–99)
Potassium: 3.9 mmol/L (ref 3.5–5.1)
Sodium: 136 mmol/L (ref 135–145)

## 2021-04-28 LAB — GLUCOSE, CAPILLARY
Glucose-Capillary: 104 mg/dL — ABNORMAL HIGH (ref 70–99)
Glucose-Capillary: 121 mg/dL — ABNORMAL HIGH (ref 70–99)
Glucose-Capillary: 110 mg/dL — ABNORMAL HIGH (ref 70–99)

## 2021-04-28 MED ORDER — IOHEXOL 350 MG/ML SOLN
100.0000 mL | Freq: Once | INTRAVENOUS | Status: AC | PRN
  Administered 2021-04-28: 100 mL via INTRAVENOUS

## 2021-04-28 NOTE — Consult Note (Signed)
[facility]    Date of Admission:  04/22/2021     Reason for Consult: cellulitis/abscess    Referring Provider: Carmelia Roller   Abx: 3/13-c cefepime 3/13-c vancomycin         Assessment: 56 yo female dm2 admitted on 3/13 for progressive ssi lle  She is s/p I&D by dr Lajoyce Corners on 3/15 with wound vac, but the last 2 days had developed another fluctuant area just distal to the left knee laterally  I reviewed the finding -- thickened necrotic fascia concerning for necrotizing fasciitis  Wound cx ngtd but show gpc in clusters -- final cx pending 3/13 bcx ngtd   This appears to be a gram positive process and likely mrsa/staph aureus. There is a new lesion on the knee -- we can get ct to assess for big abscess and if present will need further debridement  Plan: Can dc cefepime Continue vancomycin Ct LLE with contrast Could also discuss with ortho again for bedside I&D of the fluctuant area if possible  Discussed with primary team     ------------------------------------------------ Principal Problem:   Cellulitis of left leg Active Problems:   Dyslipidemia   Sepsis due to cellulitis (HCC)   Severe sepsis (HCC)   Acute kidney injury superimposed on chronic kidney disease (HCC)   Chest pain, unspecified   Uncontrolled type 2 diabetes mellitus with hyperglycemia, without long-term current use of insulin (HCC)   Abscess of left lower leg   Necrotizing fasciitis (HCC)    HPI: [name] is a 56 y.o. female dm2, cad, cva, pad, gerd, htn, hlp admitted 3/13 with severe lle ssi  Onset of redness/swelling/pain a few days prior to admission. Given outpatient doxy without improvement. Febrile at home to 102.8. developed blisters around cellulitic area.  Came to ed on 3/13 Hypotensive responded to iv abx - no pressors given Cr 1.6 slightly highre than baseline Bcx negative Started on bs abx  Taken to or on 3/15 I&D and placement of wound  vac  2 days after I&D new fluctuant/tender area lateral distal to knee joint  Afebrile this round of admission.  No n/v/diarrhea No other focal pain     Family History  Problem Relation Age of Onset   Diabetes Mother    Diabetes Father     Social History   Tobacco Use   Smoking status: Never   Smokeless tobacco: Never  Vaping Use   Vaping Use: Never used  Substance Use Topics   Alcohol use: Yes    Comment: occ   Drug use: No    Allergies  Allergen Reactions   Insulin Aspart Other (See Comments) and Swelling    adverse reaction to Novolog    Latex Itching and Rash   Morphine Itching and Rash    Review of Systems: ROS All Other ROS was negative, except mentioned above   Past Medical History:  Diagnosis Date   ABDOMINAL PAIN-EPIGASTRIC 09/14/2009   Qualifier: Diagnosis of  By: Monica Becton PA-c, [name]    Acute cystitis 07/07/2017   ANKLE PAIN, LEFT 12/25/2009   Qualifier: Diagnosis of  By: Delrae Alfred MD, [name]     Anxiety 01/22/2016   CAD (coronary artery disease) 07/07/2017   Cardiac murmur 02/01/2019   Cardiomyopathy, secondary (HCC) 09/06/2009   Qualifier: Diagnosis of  By: Jolene Provost   Formatting of this note might be different from the original. Overview:  Qualifier: Diagnosis of  By:  Geni Bers, PA-C, Oneal Grout   Cardiomyopathy, unspecified (HCC) 09/06/2009   Formatting of this note might be different from the original. Overview:  Overview:  Qualifier: Diagnosis of  By: Jolene Provost  Overview:  Overview:  Qualifier: Diagnosis of  By: Jolene Provost Formatting of this note might be different from the original. Overview:  Qualifier: Diagnosis of  By: Jolene Provost   Cerebrovascular disease 09/13/2012   Cervical cancer (HCC)    had surgery in 2001.   CHEST PAIN, ATYPICAL 07/30/2009   Qualifier: Diagnosis of  By: Delrae Alfred MD, Beverely Low of this note might be different from the  original. Overview:  Qualifier: Diagnosis of  By: Delrae Alfred MD, [name]   Chronic back pain    Chronic kidney disease, stage III (moderate) (HCC) 05/23/2014   Chronic pain syndrome 01/23/2016   Coronary artery disease    30% lesions noted   Current use of insulin (HCC) 05/23/2014   Depression    Diabetes mellitus    DIABETES MELLITUS, TYPE II, UNCONTROLLED 07/30/2009   Qualifier: Diagnosis of  By: Delrae Alfred MD, [name]     Diabetes type 2, uncontrolled 05/23/2014   Diabetic gastroparesis associated with type 1 diabetes mellitus (HCC) 11/27/2016   DIABETIC PERIPHERAL NEUROPATHY 09/21/2009   Qualifier: Diagnosis of  By: Delrae Alfred MD, [name]     Diabetic peripheral neuropathy (HCC) 01/23/2016   Dysuria 12/25/2009   Qualifier: Diagnosis of  By: Delrae Alfred MD, [name]     Elevation of level of transaminase or lactic acid dehydrogenase (LDH) 09/21/2009   Formatting of this note might be different from the original. Overview:  Qualifier: Diagnosis of  By: Delrae Alfred MD, [name]   Essential hypertension 08/30/2009   Qualifier: Diagnosis of  By: Cristela Felt, CNA, Christy     Gastric atony 07/30/2009   Formatting of this note might be different from the original. Overview:  Qualifier: Diagnosis of  By: Delrae Alfred MD, [name]   Gastroesophageal reflux disease 07/30/2009   Formatting of this note might be different from the original. Overview:  Overview:  Qualifier: Diagnosis of  By: Delrae Alfred MD, Alfonso Patten: Diagnosis of  By: Wilmon Pali NP, Consuela Mimes of this note might be different from the original. Overview:  Qualifier: Diagnosis of  By: Delrae Alfred MD, Alfonso Patten: Diagnosis of  By: Wilmon Pali NP, Gunnar Fusi   Gastroparesis 07/30/2009   Qualifier: Diagnosis of  By: Delrae Alfred MD, [name]     GERD 07/30/2009   Qualifier: Diagnosis of  By: Delrae Alfred MD, Alfonso Patten: Diagnosis of  By: Wilmon Pali NP, Gunnar Fusi     GERD (gastroesophageal reflux disease)    History of cervical cancer 08/17/2015    Status post partial hysterectomy  Formatting of this note might be different from the original. Status post partial hysterectomy   HLD (hyperlipidemia) 07/30/2009   Qualifier: Diagnosis of  By: Delrae Alfred MD, [name]     Hyperlipidemia    Hyperlipidemia, unspecified 07/30/2009   Formatting of this note might be different from the original. Overview:  Qualifier: Diagnosis of  By: Delrae Alfred MD, Beverely Low of this note might be different from the original. Overview:  Qualifier: Diagnosis of  By: Delrae Alfred MD, [name]   Hypertension    INSOMNIA 09/21/2009   Qualifier: Diagnosis of  By: Delrae Alfred MD, [name]     Lumbar facet arthropathy 05/14/2016   Lumbar spinal stenosis 02/11/2018   Metatarsalgia of both feet 03/11/2017   Migraine 09/13/2012   Overview:  IMPRESSION:  possible abd migraine   Mixed dyslipidemia 02/01/2019   Nausea & vomiting 07/07/2017   Nausea with vomiting, unspecified 09/14/2009   Qualifier: Diagnosis of  By: Myrtie Hawk, [name]   Formatting of this note might be different from the original. Overview:  Qualifier: Diagnosis of  By: Myrtie Hawk, [name]   Neuropathy    Nonspecific abnormal findings on radiological and examination of skull and head 09/13/2012   Overweight (BMI 25.0-29.9) 07/21/2014   POSITIVE PPD 07/30/2009   Annotation: CXR clear ?  diagnosed in 2/11?  On INH/pyridoxine Qualifier: Diagnosis of  By: Delrae Alfred MD, [name]     Pure hypercholesterolemia 11/27/2016   Retention of urine 06/25/2011   Right flank pain    TOBACCO ABUSE 09/21/2009   Qualifier: Diagnosis of  By: Delrae Alfred MD, Beverely Low of this note might be different from the original. Overview:  Qualifier: Diagnosis of  By: Delrae Alfred MD, Claybon Jabs, SERUM, ELEVATED 09/21/2009   Qualifier: Diagnosis of  By: Delrae Alfred MD, [name]     Trigeminal neuralgia    Tuberculin test reaction 07/30/2009   Formatting of this note might be different from the original. Overview:  Annotation:  CXR clear ?  diagnosed in 2/11?  On INH/pyridoxine Qualifier: Diagnosis of  By: Delrae Alfred MD, [name]   Type 2 diabetes mellitus with hyperglycemia (HCC) 07/30/2009   Qualifier: Diagnosis of  By: Delrae Alfred MD, Beverely Low of this note might be different from the original. Overview:  Qualifier: Diagnosis of  By: Delrae Alfred MD, [name]   Type 2 diabetes mellitus with hyperglycemia, with long-term current use of insulin (HCC) 07/30/2009   Formatting of this note might be different from the original. Overview:  05/30/2015 A1C was 16.7% (!)  Overview:  05/30/2015 A1C was 16.7% (!) Formatting of this note might be different from the original. 05/30/2015 A1C was 16.7% (!)   Type 2 diabetes mellitus with stage 3 chronic kidney disease (HCC) 09/14/2009   Qualifier: Diagnosis of  By: Myrtie Hawk, [name]    ULCER-GASTRIC 09/28/2009   Qualifier: Diagnosis of  By: Wilmon Pali NP, Gunnar Fusi     URINARY TRACT INFECTION 09/21/2009   Qualifier: Diagnosis of  By: Delrae Alfred MD, Adele Dan, CANDIDAL 12/25/2009   Qualifier: Diagnosis of  By: Delrae Alfred MD, Hubbard Robinson D deficiency 04/25/2013       Scheduled Meds:  amLODipine  10 mg Oral Daily   aspirin EC  81 mg Oral Daily   atorvastatin  80 mg Oral Daily   carvedilol  25 mg Oral BID WC   cholecalciferol  400 Units Oral Daily   docusate sodium  100 mg Oral BID   enoxaparin (LOVENOX) injection  40 mg Subcutaneous Q24H   famotidine  20 mg Oral Daily   fluticasone  2 spray Each Nare Daily   fluticasone furoate-vilanterol  1 puff Inhalation Daily   gabapentin  600 mg Oral TID   insulin glargine-yfgn  20 Units Subcutaneous Daily   insulin regular  0-9 Units Subcutaneous TID AC   metFORMIN  500 mg Oral BID WC   sertraline  100 mg Oral Daily   topiramate  50 mg Oral BID   Continuous Infusions:  ceFEPime (MAXIPIME) IV 2 g (04/28/21 0930)   methocarbamol (ROBAXIN) IV     vancomycin 150 mL/hr at 04/27/21 2041   PRN Meds:.acetaminophen,  albuterol, bisacodyl, busPIRone, HYDROmorphone (DILAUDID) injection, magnesium hydroxide, methocarbamol **OR** methocarbamol (ROBAXIN) IV, metoCLOPramide **OR**  metoCLOPramide (REGLAN) injection, nitroGLYCERIN, ondansetron **OR** ondansetron (ZOFRAN) IV, oxyCODONE, oxyCODONE, polyethylene glycol, promethazine, SUMAtriptan, traZODone   OBJECTIVE: Blood pressure 103/86, pulse 82, temperature 98.4 F (36.9 C), temperature source Oral, resp. rate 16, height 6' (1.829 m), weight 76.7 kg, SpO2 98 %.  Physical Exam  General/constitutional: no distress, pleasant HEENT: Normocephalic, PER, Conj Clear, EOMI, Oropharynx clear Neck supple CV: rrr no mrg Lungs: clear to auscultation, normal respiratory effort Abd: Soft, Nontender Ext: no edema Skin: No Rash Neuro: nonfocal MSK: LLE dressing cdi; wound vac functioning -- no purulence in canister; left knee joint laterally and just distal to joint line is a 2 inch erythematous/fluctuant/tender area   Lab Results Lab Results  Component Value Date   WBC 10.9 (H) 04/28/2021   HGB 10.4 (L) 04/28/2021   HCT 32.3 (L) 04/28/2021   MCV 91.8 04/28/2021   PLT 455 (H) 04/28/2021    Lab Results  Component Value Date   CREATININE 0.97 04/28/2021   BUN 13 04/28/2021   NA 136 04/28/2021   K 3.9 04/28/2021   CL 104 04/28/2021   CO2 24 04/28/2021    Lab Results  Component Value Date   ALT 35 04/22/2021   AST 31 04/22/2021   ALKPHOS 87 04/22/2021   BILITOT 0.7 04/22/2021      Microbiology: Recent Results (from the past 240 hour(s))  Resp Panel by RT-PCR (Flu A&B, Covid) Nasopharyngeal Swab     Status: None   Collection Time: 04/19/21  4:01 PM   Specimen: Nasopharyngeal Swab; Nasopharyngeal(NP) swabs in vial transport medium  Result Value Ref Range Status   SARS Coronavirus 2 by RT PCR NEGATIVE NEGATIVE Final    Comment: (NOTE) SARS-CoV-2 target nucleic acids are NOT DETECTED.  The SARS-CoV-2 RNA is generally detectable in upper  respiratory specimens during the acute phase of infection. The lowest concentration of SARS-CoV-2 viral copies this assay can detect is 138 copies/mL. A negative result does not preclude SARS-Cov-2 infection and should not be used as the sole basis for treatment or other patient management decisions. A negative result may occur with  improper specimen collection/handling, submission of specimen other than nasopharyngeal swab, presence of viral mutation(s) within the areas targeted by this assay, and inadequate number of viral copies(<138 copies/mL). A negative result must be combined with clinical observations, patient history, and epidemiological information. The expected result is Negative.  Fact Sheet for Patients:  BloggerCourse.com  Fact Sheet for Healthcare Providers:  SeriousBroker.it  This test is no t yet approved or cleared by the [place] FDA and  has been authorized for detection and/or diagnosis of SARS-CoV-2 by FDA under an Emergency Use Authorization (EUA). This EUA will remain  in effect (meaning this test can be used) for the duration of the COVID-19 declaration under Section 564(b)(1) of the Act, 21 U.S.C.section 360bbb-3(b)(1), unless the authorization is terminated  or revoked sooner.       Influenza A by PCR NEGATIVE NEGATIVE Final   Influenza B by PCR NEGATIVE NEGATIVE Final    Comment: (NOTE) The Xpert Xpress SARS-CoV-2/FLU/RSV plus assay is intended as an aid in the diagnosis of influenza from Nasopharyngeal swab specimens and should not be used as a sole basis for treatment. Nasal washings and aspirates are unacceptable for Xpert Xpress SARS-CoV-2/FLU/RSV testing.  Fact Sheet for Patients: BloggerCourse.com  Fact Sheet for Healthcare Providers: SeriousBroker.it  This test is not yet approved or cleared by the [place] FDA and has been  authorized for detection and/or diagnosis of  SARS-CoV-2 by FDA under an Emergency Use Authorization (EUA). This EUA will remain in effect (meaning this test can be used) for the duration of the COVID-19 declaration under Section 564(b)(1) of the Act, 21 U.S.C. section 360bbb-3(b)(1), unless the authorization is terminated or revoked.  Performed at [facility], [address] [place], [place] [address]   Blood culture (routine x 2)     Status: None   Collection Time: 04/19/21  4:05 PM   Specimen: BLOOD LEFT ARM  Result Value Ref Range Status   Specimen Description   Final    BLOOD LEFT ARM BLOOD Performed at [facility], [address] [place], [place] [address]    Special Requests   Final    Blood Culture adequate volume BOTTLES DRAWN AEROBIC AND ANAEROBIC Performed at [facility], [address] [place], [place] [address]    Culture   Final    NO GROWTH 5 DAYS Performed at [facility], [address] [place], [place] [address]    Report Status 04/24/2021 FINAL  Final  Blood culture (routine x 2)     Status: None   Collection Time: 04/19/21  4:16 PM   Specimen: BLOOD RIGHT HAND  Result Value Ref Range Status   Specimen Description   Final    BLOOD RIGHT HAND BLOOD Performed at [facility], [address] [place], [place] [address]    Special Requests   Final    Blood Culture adequate volume BOTTLES DRAWN AEROBIC AND ANAEROBIC Performed at [facility], [address], [place], [place] [address]    Culture   Final    NO GROWTH 5 DAYS Performed at [facility], [address], [place], [place] [address]    Report Status 04/24/2021 FINAL  Final  Blood Culture (routine x 2)     Status: None   Collection Time: 04/22/21 12:49 PM   Specimen: BLOOD  Result Value Ref Range Status   Specimen Description   Final    BLOOD Blood Culture adequate volume Performed at [facility], [address] [place], [place] [address]    Special Requests   Final    BOTTLES DRAWN AEROBIC AND ANAEROBIC BLOOD RIGHT FOREARM Performed at [facility], [address], [place], [place] [address]    Culture   Final    NO GROWTH 5 DAYS Performed at [facility], [address], [place], [place] [address]    Report Status 04/27/2021 FINAL  Final  Blood Culture (routine x 2)     Status: None   Collection Time: 04/22/21  1:44 PM   Specimen: BLOOD  Result Value Ref Range Status   Specimen Description   Final    BLOOD BLOOD LEFT HAND Performed at [facility], [address] [place], [place] [address]    Special Requests   Final    AEROBIC BOTTLE ONLY Blood Culture adequate volume Performed at [facility], [address] [place], [place] [address]    Culture   Final    NO GROWTH 5 DAYS Performed at [facility], [address] [place], [place] [address]    Report Status 04/27/2021 FINAL  Final  MRSA Next Gen by PCR, Nasal     Status: None   Collection Time: 04/23/21  7:27 PM   Specimen: Nasal  Mucosa; Nasal Swab  Result Value Ref Range Status   MRSA by PCR Next Gen NOT DETECTED NOT DETECTED Final    Comment: (NOTE) The GeneXpert MRSA Assay (FDA approved for NASAL specimens only), is one component of a comprehensive MRSA colonization surveillance program. It is not intended to diagnose MRSA infection nor to guide or monitor treatment for MRSA infections. Test performance is not FDA approved in patients less than 66 years old. Performed at [facility], [address], [place], [place] [address]   Aerobic/Anaerobic Culture w Gram Stain (surgical/deep wound)     Status: None (Preliminary result)   Collection Time: 04/24/21  4:34 PM   Specimen: Wound; Abscess  Result Value Ref Range Status   Specimen Description TISSUE LEFT LEG  Final   Special Requests NONE  Final   Gram Stain   Final    RARE WBC PRESENT,BOTH PMN AND  MONONUCLEAR RARE GRAM POSITIVE COCCI IN CLUSTERS    Culture   Final    NO GROWTH 3 DAYS NO ANAEROBES ISOLATED; CULTURE IN PROGRESS FOR 5 DAYS Performed at [facility], [address] [place], [place] [address]    Report Status PENDING  Incomplete     Serology:    Imaging: If present, new imagings (plain films, ct scans, and mri) have been personally visualized and interpreted; radiology reports have been reviewed. Decision making incorporated into the Impression / Recommendations.    Raymondo Band, MD [facility] [phone] pager    04/28/2021, 11:40 AM

## 2021-04-28 NOTE — Progress Notes (Signed)
PT Progress Note ? ?New order received for PT eval. Pt is currently on PT caseload. Current POC remains appropriate. Next PT session is scheduled for tomorrow. ? ?Lorrin Goodell, PT  ?Office # [phone] ?Pager [phone] ? ?

## 2021-04-28 NOTE — Progress Notes (Signed)
?PROGRESS NOTE ? ? ? ?[name]  JSH:[id number] DOB: 06-06-1965 DOA: 04/22/2021 ?PCP: Mackie Pai, PA-C  ? ?  ?Brief Narrative:  ?56 year old female with a history of type 2 diabetes, hypertension, hyperlipidemia, CAD, cardiomyopathy, CKD, GERD, and depression presenting on 3/13 with left leg cellulitis failing outpatient doxycycline.  She first noticed on 3/9 and enlarged on 3/11.  On 3/15, she underwent a left leg excisional debridement with wound VAC placement by the orthopedic surgery team. ? ? ?New events last 24 hours / Subjective: ?Pain is not better, not ambulating very much.  Feels that the redness and pain is spreading up her leg with some swelling.  Unsure if Ortho saw her yesterday.  They did the debridement on 3/15. ? ?Assessment & Plan: ?  ?Principal Problem: ?  Cellulitis of left leg ?Active Problems: ?  Dyslipidemia ?  Sepsis due to cellulitis [facility]) ?  Severe sepsis ([place]) ?  Acute kidney injury superimposed on chronic kidney disease ([place]) ?  Chest pain, unspecified ?  Uncontrolled type 2 diabetes mellitus with hyperglycemia, without long-term current use of insulin ([place]) ?  Abscess of left lower leg ?  Necrotizing fasciitis ([place]) ? ?Cellulitis of left leg with abscess/possible necrotizing fasciitis; severe sepsis ? Cellulitis seems to be worsening despite no changes in her numbers, will consult infectious disease ?            Appreciate orthopedic surgery ?            Dilaudid as needed for severe pain ?            Oxycodone as needed for moderate pain ?            Will continue cefepime and vancomycin ?            Has been afebrile, no leukocytosis, lactate is normalized ?            Wound cultures pending, blood cultures show no growth to date ?            Sepsis has resolved ?            Robaxin 500 mg every 6 hours as needed ?  ?Acute kidney injury superimposed on CKD ?            Resolved, monitor BMP ?  ?Uncontrolled type 2 diabetes with hyperglycemia and diabetic neuropathy ?             Semglee 20 u in the morning, metformin 500 mg twice daily restarted ?            Continue gabapentin    ?            Low-carb diet ?            If refusing insulin moving forward, will consider moderate dose of a sulfonylurea like glyburide 5 mg twice daily as she is not insulin na?ve ?  ?Dyslipidemia, CAD ?            Continue Lipitor 80 mg daily ?            Continue baby aspirin with history of CAD ?  ?CHF/essential hypertension ?            Continue Norvasc 10 mg daily, Coreg 25 mg twice daily ?  ?Migraines/depression ?            Continue Topamax 50 mg twice daily ?            Continue  Zoloft 100 mg daily, trazodone 25 mg at night as needed for sleep ? ?DVT prophylaxis: Lovenox ?Code Status: Full ?Family Communication: Self ?Coming From: Home  ?Disposition Plan: Home ?Barriers to Discharge: Clinical improvement ?  ?Consultants:  ?Orthopedic surgery ?Infectious disease ?  ?Procedures:  ?04/24/21-left excisional wound debridement and wound VAC placement ? ?Antimicrobials:  ?Anti-infectives (From admission, onward)  ? ? Start     Dose/Rate Route Frequency Ordered Stop  ? 04/24/21 1200  vancomycin (VANCOREADY) IVPB 1500 mg/300 mL       ? 1,500 mg ?150 mL/hr over 120 Minutes Intravenous Every 24 hours 04/23/21 1455    ? 04/24/21 0600  ceFAZolin (ANCEF) IVPB 2g/100 mL premix       ? 2 g ?200 mL/hr over 30 Minutes Intravenous On call to O.R. 04/23/21 1928 04/24/21 1650  ? 04/23/21 1200  vancomycin (VANCOREADY) IVPB 1250 mg/250 mL  Status:  Discontinued       ? 1,250 mg ?166.7 mL/hr over 90 Minutes Intravenous Every 24 hours 04/22/21 1342 04/23/21 1455  ? 04/23/21 1000  ceFEPIme (MAXIPIME) 2 g in sodium chloride 0.9 % 100 mL IVPB       ? 2 g ?200 mL/hr over 30 Minutes Intravenous Every 12 hours 04/22/21 2149    ? 04/22/21 2245  metroNIDAZOLE (FLAGYL) tablet 500 mg  Status:  Discontinued       ? 500 mg Oral Every 12 hours 04/22/21 2155 04/22/21 2257  ? 04/22/21 2230  ceFEPIme (MAXIPIME) 2 g in sodium chloride 0.9 %  100 mL IVPB       ? 2 g ?200 mL/hr over 30 Minutes Intravenous  Once 04/22/21 2135 04/22/21 2314  ? 04/22/21 2230  metroNIDAZOLE (FLAGYL) IVPB 500 mg  Status:  Discontinued       ? 500 mg ?100 mL/hr over 60 Minutes Intravenous Every 12 hours 04/22/21 2135 04/22/21 2155  ? 04/22/21 2230  vancomycin (VANCOCIN) IVPB 1000 mg/200 mL premix  Status:  Discontinued       ? 1,000 mg ?200 mL/hr over 60 Minutes Intravenous  Once 04/22/21 2135 04/22/21 2155  ? 04/22/21 1345  vancomycin (VANCOCIN) IVPB 1000 mg/200 mL premix       ? 1,000 mg ?200 mL/hr over 60 Minutes Intravenous  Once 04/22/21 1338 04/22/21 1506  ? ?  ? ? ? ?Objective: ?Vitals:  ? 04/27/21 1536 04/27/21 2006 04/28/21 0236 04/28/21 0739  ?BP: 101/84 105/77 95/68 103/86  ?Pulse: 84 80 77 82  ?Resp: 16   16  ?Temp: 98.5 ?F (36.9 ?C) 98.4 ?F (36.9 ?C)  98.4 ?F (36.9 ?C)  ?TempSrc: Oral Oral  Oral  ?SpO2:  93% 96% 98%  ?Weight:      ?Height:      ? ? ?Intake/Output Summary (Last 24 hours) at 04/28/2021 1105 ?Last data filed at 04/28/2021 0453 ?Gross per 24 hour  ?Intake 1306.28 ml  ?Output --  ?Net 1306.28 ml  ? ?Filed Weights  ? 04/22/21 1219 04/22/21 1253  ?Weight: 76.7 kg 76.7 kg  ? ? ?Examination:  ?General exam: Appears calm and comfortable  ?Respiratory system: Clear to auscultation. Respiratory effort normal. No respiratory distress. No conversational dyspnea.  ?Cardiovascular system: S1 & S2 heard, RRR. 2+ pitting edema of the left foot, no lower extreme edema of the right ?Gastrointestinal system: Abdomen is nondistended, soft and nontender. Normal bowel sounds heard. ?Central nervous system: Alert and oriented. No focal neurological deficits. Speech clear.  ?Extremities: Symmetric in appearance  ?Skin: Left lower extremity  with wrapping in place, over the lateral knee/proximal leg, there is erythema, warmth, and induration appreciated, it is tender to palpation ?Psychiatry: Judgement and insight appear normal. Mood & affect appropriate.  ? ?Data Reviewed: I  have personally reviewed following labs and imaging studies ? ?CBC: ?Recent Labs  ?Lab 04/24/21 ?0208 04/25/21 ?0200 04/26/21 ?0232 04/27/21 ?7711 04/28/21 ?0152  ?WBC 9.5 8.0 13.4* 10.9* 10.9*  ?NEUTROABS 5.5 5.9 10.0* 6.5 7.2  ?HGB 10.2* 9.4* 9.9* 9.8* 10.4*  ?HCT 31.3* 30.6* 30.2* 31.1* 32.3*  ?MCV 91.8 89.2 91.2 92.3 91.8  ?PLT 296 201 397 436* 455*  ? ?Basic Metabolic Panel: ?Recent Labs  ?Lab 04/24/21 ?0208 04/25/21 ?0200 04/26/21 ?0232 04/27/21 ?6579 04/28/21 ?0152  ?NA 139 137 136 141 136  ?K 4.2 4.2 4.8 4.3 3.9  ?CL 108 99 105 109 104  ?CO2 '22 25 24 22 24  '$ ?GLUCOSE 128* 151* 359* 164* 126*  ?BUN 12 25* '17 13 13  '$ ?CREATININE 1.21* 1.08* 1.21* 1.08* 0.97  ?CALCIUM 8.2* 8.9 8.4* 8.5* 8.5*  ? ?GFR: ?Estimated Creatinine Clearance: 74.7 mL/min (by C-G formula based on SCr of 0.97 mg/dL). ? ?Liver Function Tests: ?Recent Labs  ?Lab 04/22/21 ?1244  ?AST 31  ?ALT 35  ?ALKPHOS 87  ?BILITOT 0.7  ?PROT 8.3*  ?ALBUMIN 3.3*  ? ?Coagulation Profile: ?Recent Labs  ?Lab 04/22/21 ?1244 04/23/21 ?0240  ?INR 1.0 1.0  ? ?CBG: ?Recent Labs  ?Lab 04/27/21 ?0830 04/27/21 ?1148 04/27/21 ?1625 04/27/21 ?2034 04/28/21 ?0728  ?GLUCAP 120* 119* 128* 173* 104*  ? ?Sepsis Labs: ?Recent Labs  ?Lab 04/22/21 ?1244 04/22/21 ?1447 04/23/21 ?0240 04/27/21 ?0245  ?PROCALCITON  --   --  0.52  --   ?LATICACIDVEN 2.3* 3.4*  --  1.1  ? ? ?Recent Results (from the past 240 hour(s))  ?Resp Panel by RT-PCR (Flu A&B, Covid) Nasopharyngeal Swab     Status: None  ? Collection Time: 04/19/21  4:01 PM  ? Specimen: Nasopharyngeal Swab; Nasopharyngeal(NP) swabs in vial transport medium  ?Result Value Ref Range Status  ? SARS Coronavirus 2 by RT PCR NEGATIVE NEGATIVE Final  ?  Comment: (NOTE) ?SARS-CoV-2 target nucleic acids are NOT DETECTED. ? ?The SARS-CoV-2 RNA is generally detectable in upper respiratory ?specimens during the acute phase of infection. The lowest ?concentration of SARS-CoV-2 viral copies this assay can detect is ?138 copies/mL. A negative  result does not preclude SARS-Cov-2 ?infection and should not be used as the sole basis for treatment or ?other patient management decisions. A negative result may occur with  ?improper specimen coll

## 2021-04-28 NOTE — Progress Notes (Signed)
Mobility Specialist Progress Note  ? ? 04/28/21 1516  ?Mobility  ?Activity Ambulated with assistance in hallway  ?Level of Assistance Standby assist, set-up cues, supervision of patient - no hands on  ?Assistive Device  ?(IV pole)  ?Distance Ambulated (ft) 450 ft  ?Activity Response Tolerated well  ?$Mobility charge 1 Mobility  ? ?Pt received sitting EOB and agreeable. C/o 8/10 pain. Returned to sitting EOB with call bell in reach.  ? ?Hildred Alamin ?Mobility Specialist  ?  ?

## 2021-04-29 LAB — BASIC METABOLIC PANEL
Anion gap: 10 (ref 5–15)
BUN: 14 mg/dL (ref 6–20)
CO2: 21 mmol/L — ABNORMAL LOW (ref 22–32)
Calcium: 8.6 mg/dL — ABNORMAL LOW (ref 8.9–10.3)
Chloride: 106 mmol/L (ref 98–111)
Creatinine, Ser: 1.04 mg/dL — ABNORMAL HIGH (ref 0.44–1.00)
GFR, Estimated: >60 mL/min (ref >60)
Glucose, Bld: 176 mg/dL — ABNORMAL HIGH (ref 70–99)
Potassium: 3.9 mmol/L (ref 3.5–5.1)
Sodium: 137 mmol/L (ref 135–145)

## 2021-04-29 LAB — CBC WITH DIFFERENTIAL/PLATELET
Abs Immature Granulocytes: 0.11 10*3/uL — ABNORMAL HIGH (ref 0.00–0.07)
Basophils Absolute: 0 10*3/uL (ref 0.0–0.1)
Basophils Relative: 0 %
Eosinophils Absolute: 0.4 10*3/uL (ref 0.0–0.5)
Eosinophils Relative: 4 %
HCT: 35 % — ABNORMAL LOW (ref 36.0–46.0)
Hemoglobin: 11.1 g/dL — ABNORMAL LOW (ref 12.0–15.0)
Immature Granulocytes: 1 %
Lymphocytes Relative: 23 %
Lymphs Abs: 2.4 10*3/uL (ref 0.7–4.0)
MCH: 28.8 pg (ref 26.0–34.0)
MCHC: 31.7 g/dL (ref 30.0–36.0)
MCV: 90.9 fL (ref 80.0–100.0)
Monocytes Absolute: 1 10*3/uL (ref 0.1–1.0)
Monocytes Relative: 10 %
Neutro Abs: 6.5 10*3/uL (ref 1.7–7.7)
Neutrophils Relative %: 62 %
Platelets: 434 10*3/uL — ABNORMAL HIGH (ref 150–400)
RBC: 3.85 MIL/uL — ABNORMAL LOW (ref 3.87–5.11)
RDW: 15.3 % (ref 11.5–15.5)
WBC: 10.5 10*3/uL (ref 4.0–10.5)
nRBC: 0.2 % (ref 0.0–0.2)

## 2021-04-29 LAB — GLUCOSE, CAPILLARY
Glucose-Capillary: 176 mg/dL — ABNORMAL HIGH (ref 70–99)
Glucose-Capillary: 163 mg/dL — ABNORMAL HIGH (ref 70–99)
Glucose-Capillary: 104 mg/dL — ABNORMAL HIGH (ref 70–99)

## 2021-04-29 LAB — AEROBIC/ANAEROBIC CULTURE W GRAM STAIN (SURGICAL/DEEP WOUND): Culture: NO GROWTH

## 2021-04-29 MED ORDER — SUCRALFATE 1 GM/10ML PO SUSP
1.0000 g | Freq: Three times a day (TID) | ORAL | Status: DC
  Administered 2021-04-29 – 2021-05-03 (×12): 1 g via ORAL
  Filled 2021-04-29 (×14): qty 10

## 2021-04-29 NOTE — Progress Notes (Signed)
Physical Therapy Treatment ?Patient Details ?Name: [name] ?MRN: [id number] ?DOB: August 17, 1965 ?Today's Date: 04/29/2021 ? ? ?History of Present Illness Pt is a 56 y.o. female admitted 04/22/21 with LLE cellulitis and abcess not responding to a course of oral doxycycline as an outpatient. S/p LLE debridement 3/15. Pt with new abscess on LLE 3/20; plan for additional surgical excisional debridement 3/22. PMH includes CAD, CKD III, DM2, HTN, HLD, cardiomyopathy, lumbar stenosis, anxiety. ?  ?PT Comments  ? ? Pt progressing with mobility. Today's session focused on gait and stair training; pt moving well with intermittent minA, demonstrates improving gait mechanics. Pt remains limited by pain and impaired balance strategies/postural reactions. Will continue to follow acutely to address established goals ? ?Post-ambulation BP 97/81; HR 96 ?   ?Recommendations for follow up therapy are one component of a multi-disciplinary discharge planning process, led by the attending physician.  Recommendations may be updated based on patient status, additional functional criteria and insurance authorization. ? ?Follow Up Recommendations ? No PT follow up ?  ?  ?Assistance Recommended at Discharge Frequent or constant Supervision/Assistance  ?Patient can return home with the following Assistance with cooking/housework;Assist for transportation;Help with stairs or ramp for entrance ?  ?Equipment Recommendations ? Rolling walker (2 wheels)  ?  ?Recommendations for Other Services   ? ? ?  ?Precautions / Restrictions Precautions ?Precautions: Fall ?Restrictions ?Weight Bearing Restrictions: Yes ?LLE Weight Bearing: Weight bearing as tolerated  ?  ? ?Mobility ? Bed Mobility ?Overal bed mobility: Independent ?  ?  ?  ?  ?  ?  ?General bed mobility comments: return to supine with bed flat ?  ? ?Transfers ?Overall transfer level: Needs assistance ?Equipment used: Rolling walker (2 wheels) ?Transfers: Sit to/from Stand ?Sit to Stand:  Supervision ?  ?  ?  ?  ?  ?General transfer comment: encouraged use of RW to offload painful LLE and decrease WBAT; supervision for safety ?  ? ?Ambulation/Gait ?Ambulation/Gait assistance: Min guard, Supervision ?Gait Distance (Feet): 250 Feet ?Assistive device: Rolling walker (2 wheels) ?Gait Pattern/deviations: Step-through pattern, Decreased stride length, Trunk flexed, Antalgic ?Gait velocity: Decreased ?  ?  ?General Gait Details: Slow, antalgic gait with RW and intermittent min guard for balance; pt with good heel strike bilaterally without cues; verbal cues for fully upright posture and closer proximity to RW; pt 1x running into object on R side with walker wheel, able to self-correct ? ? ?Stairs ?Stairs: Yes ?Stairs assistance: Min guard, Min assist ?Stair Management: One rail Right, No rails, Step to pattern, Forwards ?Number of Stairs: 4 ?General stair comments: Ascend/descend 2 steps initially with rail support, min guard for balance, cues for sequencing. additional trial with LUE HHA for stability, minA for this ? ? ?Wheelchair Mobility ?  ? ?Modified Rankin (Stroke Patients Only) ?  ? ? ?  ?Balance Overall balance assessment: Needs assistance ?Sitting-balance support: No upper extremity supported ?Sitting balance-Leahy Scale: Good ?  ?  ?Standing balance support: No upper extremity supported, During functional activity ?Standing balance-Leahy Scale: Fair ?Standing balance comment: can static stand and take steps without UE support; encouraged RW use to offload LLE ?  ?  ?  ?  ?  ?  ?  ?  ?  ?  ?  ?  ? ?  ?Cognition Arousal/Alertness: Awake/alert ?Behavior During Therapy: Flat affect ?Overall Cognitive Status: No family/caregiver present to determine baseline cognitive functioning ?  ?  ?  ?  ?  ?  ?  ?  ?  ?  ?  ?  ?  ?  ?  ?  ?  General Comments: flat affect and soft speech, but cognition seems more appropriate than last PT session; pt running into object on floor 1x with RW, suspect improving  awareness, not impulsive this session ?  ?  ? ?  ?Exercises   ? ?  ?General Comments General comments (skin integrity, edema, etc.): pt with good awareness to discuss plan for procedure wednesday ?  ?  ? ?Pertinent Vitals/Pain Pain Assessment ?Pain Assessment: Faces ?Faces Pain Scale: Hurts little more ?Pain Location: LLE ?Pain Descriptors / Indicators: Discomfort, Guarding ?Pain Intervention(s): Monitored during session, Limited activity within patient's tolerance  ? ? ?Home Living   ?  ?  ?  ?  ?  ?  ?  ?  ?  ?   ?  ?Prior Function    ?  ?  ?   ? ?PT Goals (current goals can now be found in the care plan section) Progress towards PT goals: Progressing toward goals ? ?  ?Frequency ? ? ? Min 3X/week ? ? ? ?  ?PT Plan Current plan remains appropriate  ? ? ?Co-evaluation   ?  ?  ?  ?  ? ?  ?AM-PAC PT "6 Clicks" Mobility   ?Outcome Measure ? Help needed turning from your back to your side while in a flat bed without using bedrails?: None ?Help needed moving from lying on your back to sitting on the side of a flat bed without using bedrails?: None ?Help needed moving to and from a bed to a chair (including a wheelchair)?: A Little ?Help needed standing up from a chair using your arms (e.g., wheelchair or bedside chair)?: A Little ?Help needed to walk in hospital room?: A Little ?Help needed climbing 3-5 steps with a railing? : A Little ?6 Click Score: 20 ? ?  ?End of Session Equipment Utilized During Treatment: Gait belt ?Activity Tolerance: Patient tolerated treatment well ?Patient left: in bed;with call bell/phone within reach;with bed alarm set ?Nurse Communication: Mobility status ?PT Visit Diagnosis: Unsteadiness on feet (R26.81);Other abnormalities of gait and mobility (R26.89);Pain ?Pain - Right/Left: Left ?Pain - part of body: Leg ?  ? ? ?Time: 4967-5916 ?PT Time Calculation (min) (ACUTE ONLY): 22 min ? ?Charges:  $Gait Training: 8-22 mins          ?          ? ?[name], PT, DPT ?Acute Rehabilitation  Services  ?Pager [phone] ?Office [phone] ? ?[name] ?04/29/2021, 11:01 AM ? ?

## 2021-04-29 NOTE — Progress Notes (Signed)
?PROGRESS NOTE ? ? ? ?[name]  MIW:[id number] DOB: 1965/03/14 DOA: 04/22/2021 ?PCP: Mackie Pai, PA-C  ? ?  ?Brief Narrative:  ?56 year old female with a history of type 2 diabetes, hypertension, hyperlipidemia, CAD, cardiomyopathy, CKD, GERD, and depression presenting on 3/13 with left leg cellulitis failing outpatient doxycycline.  She first noticed on 3/9 and enlarged on 3/11.  On 3/15, she underwent a left leg excisional debridement with wound VAC placement by the orthopedic surgery team. ? ?04/29/2021: Patient seen.  Available records reviewed.  Also discussed with the infectious disease team.  Patient remains on IV vancomycin.  Patient admitted need another I&D.  No complaints from the patient.  No fever or chills, no shortness of breath.  No headache or neck pain reported. ? ?Assessment & Plan: ?  ?Principal Problem: ?  Cellulitis of left leg ?Active Problems: ?  Dyslipidemia ?  Sepsis due to cellulitis [facility]) ?  Severe sepsis ([place]) ?  Acute kidney injury superimposed on chronic kidney disease ([place]) ?  Chest pain, unspecified ?  Uncontrolled type 2 diabetes mellitus with hyperglycemia, without long-term current use of insulin ([place]) ?  Abscess of left lower leg ?  Necrotizing fasciitis ([place]) ? ?Cellulitis of left leg with abscess/possible necrotizing fasciitis; severe sepsis ? Cellulitis seems to be worsening despite no changes in her numbers, will consult infectious disease ?            Appreciate orthopedic surgery ?            Dilaudid as needed for severe pain ?            Oxycodone as needed for moderate pain ?            Will continue cefepime and vancomycin ?            Has been afebrile, no leukocytosis, lactate is normalized ?            Wound cultures pending, blood cultures show no growth to date ?            Sepsis has resolved ?            Robaxin 500 mg every 6 hours as needed ? ?04/29/2021: Patient is only on IV vancomycin.  Cefepime has been discontinued.  For possible repeat I&D.    ?  ?Acute kidney injury superimposed on CKD ?            Resolved, monitor BMP ?  ?Uncontrolled type 2 diabetes with hyperglycemia and diabetic neuropathy ?            Semglee 20 u in the morning, metformin 500 mg twice daily restarted ?            Continue gabapentin    ?            Low-carb diet ?            If refusing insulin moving forward, will consider moderate dose of a sulfonylurea like glyburide 5 mg twice daily as she is not insulin na?ve ?04/29/2021: Patient is currently on subcutaneous Semglee 20 units once daily, sliding scale insulin coverage, metformin 500 Mg p.o. twice daily ?  ?Dyslipidemia, CAD ?            Continue Lipitor 80 mg daily ?            Continue baby aspirin with history of CAD ?  ?CHF/essential hypertension ?  Continue Norvasc 10 mg daily, Coreg 25 mg twice daily ?04/29/2021: Blood pressures controlled. ?  ?Migraines/depression ?            Continue Topamax 50 mg twice daily ?            Continue Zoloft 100 mg daily, trazodone 25 mg at night as needed for sleep ? ?DVT prophylaxis: Lovenox ?Code Status: Full ?Family Communication: Self ?Coming From: Home  ?Disposition Plan: Home ?Barriers to Discharge: Clinical improvement ?  ?Consultants:  ?Orthopedic surgery ?Infectious disease ?  ?Procedures:  ?04/24/21-left excisional wound debridement and wound VAC placement ? ?Antimicrobials:  ?Anti-infectives (From admission, onward)  ? ? Start     Dose/Rate Route Frequency Ordered Stop  ? 04/24/21 1200  vancomycin (VANCOREADY) IVPB 1500 mg/300 mL       ? 1,500 mg ?150 mL/hr over 120 Minutes Intravenous Every 24 hours 04/23/21 1455    ? 04/24/21 0600  ceFAZolin (ANCEF) IVPB 2g/100 mL premix       ? 2 g ?200 mL/hr over 30 Minutes Intravenous On call to O.R. 04/23/21 1928 04/24/21 1650  ? 04/23/21 1200  vancomycin (VANCOREADY) IVPB 1250 mg/250 mL  Status:  Discontinued       ? 1,250 mg ?166.7 mL/hr over 90 Minutes Intravenous Every 24 hours 04/22/21 1342 04/23/21 1455  ? 04/23/21 1000   ceFEPIme (MAXIPIME) 2 g in sodium chloride 0.9 % 100 mL IVPB  Status:  Discontinued       ? 2 g ?200 mL/hr over 30 Minutes Intravenous Every 12 hours 04/22/21 2149 04/28/21 1711  ? 04/22/21 2245  metroNIDAZOLE (FLAGYL) tablet 500 mg  Status:  Discontinued       ? 500 mg Oral Every 12 hours 04/22/21 2155 04/22/21 2257  ? 04/22/21 2230  ceFEPIme (MAXIPIME) 2 g in sodium chloride 0.9 % 100 mL IVPB       ? 2 g ?200 mL/hr over 30 Minutes Intravenous  Once 04/22/21 2135 04/22/21 2314  ? 04/22/21 2230  metroNIDAZOLE (FLAGYL) IVPB 500 mg  Status:  Discontinued       ? 500 mg ?100 mL/hr over 60 Minutes Intravenous Every 12 hours 04/22/21 2135 04/22/21 2155  ? 04/22/21 2230  vancomycin (VANCOCIN) IVPB 1000 mg/200 mL premix  Status:  Discontinued       ? 1,000 mg ?200 mL/hr over 60 Minutes Intravenous  Once 04/22/21 2135 04/22/21 2155  ? 04/22/21 1345  vancomycin (VANCOCIN) IVPB 1000 mg/200 mL premix       ? 1,000 mg ?200 mL/hr over 60 Minutes Intravenous  Once 04/22/21 1338 04/22/21 1506  ? ?  ? ? ? ?Objective: ?Vitals:  ? 04/28/21 2109 04/29/21 0308 04/29/21 0727 04/29/21 0840  ?BP: 101/63 113/74 102/80 111/67  ?Pulse: 86 85 90 93  ?Resp:   18 16  ?Temp: 98.9 ?F (37.2 ?C)  98.5 ?F (36.9 ?C)   ?TempSrc: Oral  Oral   ?SpO2: 97% 96% 100%   ?Weight:      ?Height:      ? ? ?Intake/Output Summary (Last 24 hours) at 04/29/2021 1005 ?Last data filed at 04/28/2021 2249 ?Gross per 24 hour  ?Intake 300 ml  ?Output --  ?Net 300 ml  ? ? ?Filed Weights  ? 04/22/21 1219 04/22/21 1253  ?Weight: 76.7 kg 76.7 kg  ? ? ?Examination:  ?General exam: Appears calm.  Patient is resting quietly.  Not in any distress.  Patient is awake and alert.  Facial hair noted. ?HEENT: Not pale.  No jaundice.  Dry buccal mucosa. ?Respiratory system: Clear to auscultation.  ?Cardiovascular system: S1 & S2 heard ?Gastrointestinal system: Abdomen is nondistended, soft and nontender. Normal bowel sounds heard. ?Central nervous system: Alert and oriented.  Patient  moves all extremities.  Extremities: Status post I&D of left lower leg with stitches.  Patient has poor posterior aspect of left lower leg (closer to posterior aspect of the knee). ?  ? ?Data Reviewed: I have personally reviewed following labs and imaging studies ? ?CBC: ?Recent Labs  ?Lab 04/25/21 ?0200 04/26/21 ?0232 04/27/21 ?7510 04/28/21 ?2585 04/29/21 ?0229  ?WBC 8.0 13.4* 10.9* 10.9* 10.5  ?NEUTROABS 5.9 10.0* 6.5 7.2 6.5  ?HGB 9.4* 9.9* 9.8* 10.4* 11.1*  ?HCT 30.6* 30.2* 31.1* 32.3* 35.0*  ?MCV 89.2 91.2 92.3 91.8 90.9  ?PLT 201 397 436* 455* 434*  ? ? ?Basic Metabolic Panel: ?Recent Labs  ?Lab 04/25/21 ?0200 04/26/21 ?0232 04/27/21 ?2778 04/28/21 ?2423 04/29/21 ?0229  ?NA 137 136 141 136 137  ?K 4.2 4.8 4.3 3.9 3.9  ?CL 99 105 109 104 106  ?CO2 '25 24 22 24 '$ 21*  ?GLUCOSE 151* 359* 164* 126* 176*  ?BUN 25* '17 13 13 14  '$ ?CREATININE 1.08* 1.21* 1.08* 0.97 1.04*  ?CALCIUM 8.9 8.4* 8.5* 8.5* 8.6*  ? ? ?GFR: ?Estimated Creatinine Clearance: 69.7 mL/min (A) (by C-G formula based on SCr of 1.04 mg/dL (H)). ? ?Liver Function Tests: ?Recent Labs  ?Lab 04/22/21 ?1244  ?AST 31  ?ALT 35  ?ALKPHOS 87  ?BILITOT 0.7  ?PROT 8.3*  ?ALBUMIN 3.3*  ? ? ?Coagulation Profile: ?Recent Labs  ?Lab 04/22/21 ?1244 04/23/21 ?0240  ?INR 1.0 1.0  ? ? ?CBG: ?Recent Labs  ?Lab 04/27/21 ?2034 04/28/21 ?0728 04/28/21 ?1126 04/28/21 ?1522 04/28/21 ?1932  ?GLUCAP 173* 104* 121* 110* 176*  ? ? ?Sepsis Labs: ?Recent Labs  ?Lab 04/22/21 ?1244 04/22/21 ?1447 04/23/21 ?0240 04/27/21 ?0245  ?PROCALCITON  --   --  0.52  --   ?LATICACIDVEN 2.3* 3.4*  --  1.1  ? ? ? ?Recent Results (from the past 240 hour(s))  ?Resp Panel by RT-PCR (Flu A&B, Covid) Nasopharyngeal Swab     Status: None  ? Collection Time: 04/19/21  4:01 PM  ? Specimen: Nasopharyngeal Swab; Nasopharyngeal(NP) swabs in vial transport medium  ?Result Value Ref Range Status  ? SARS Coronavirus 2 by RT PCR NEGATIVE NEGATIVE Final  ?  Comment: (NOTE) ?SARS-CoV-2 target nucleic acids are NOT  DETECTED. ? ?The SARS-CoV-2 RNA is generally detectable in upper respiratory ?specimens during the acute phase of infection. The lowest ?concentration of SARS-CoV-2 viral copies this assay can detect is ?138 co

## 2021-04-29 NOTE — Progress Notes (Signed)
Pharmacy Antibiotic Note ? ?[name] is a 56 y.o. female admitted on 04/22/2021 with LLE cellulitis, abscess and necrotizing fasciitis  Pharmacy has been consulted for Vancomycin dosing.  Now s/p I and D with Dr. Sharol Given on 3/15. OR culture no growth to date but with gram positive cocci on the gram stain. Plans to return to OR on 3/22. SCr is stable at 1.04.  ? ? ?Plan: ?Continue Vancomycin 1500 mg IV q24hr for now ?Discussed with ID and may convert to PO after OR on 3/22 ?If no oral switch will plan Vanc levels later this week ?Continue to monitor renal function, cultures and surgical plans  ? ?Height: 6' (182.9 cm) ?Weight: 76.7 kg (169 lb) ?IBW/kg (Calculated) : 73.1 ? ?Temp (24hrs), Avg:98.7 ?F (37.1 ?C), Min:98.5 ?F (36.9 ?C), Max:98.9 ?F (37.2 ?C) ? ?Recent Labs  ?Lab 04/22/21 ?1244 04/22/21 ?1447 04/23/21 ?0240 04/25/21 ?0200 04/26/21 ?0232 04/27/21 ?9480 04/28/21 ?1655 04/29/21 ?0229  ?WBC 9.9  --    < > 8.0 13.4* 10.9* 10.9* 10.5  ?CREATININE 1.59*  --    < > 1.08* 1.21* 1.08* 0.97 1.04*  ?LATICACIDVEN 2.3* 3.4*  --   --   --  1.1  --   --   ? < > = values in this interval not displayed.  ? ?  ?Estimated Creatinine Clearance: 69.7 mL/min (A) (by C-G formula based on SCr of 1.04 mg/dL (H)).   ? ?Allergies  ?Allergen Reactions  ? Insulin Aspart Other (See Comments) and Swelling  ?  adverse reaction to Novolog ?  ? Latex Itching and Rash  ? Morphine Itching and Rash  ? ? ?Antimicrobials this admission: ?Vancomycin 3/13>> ?Cefepime 3/13>> 3/19  ?Metronidazole PO x 1 on 3/13 ? ?Dose adjustments this admission: ?3/14: empiric Vanc increase 1250 mg to 1500 mg IV q24h for improved creatinine ? ?Microbiology results: ?3/13: blood: no growth final ?3/15 Tissue: GPC in clusters ? ?Jimmy Footman, PharmD, BCPS, BCIDP ?Infectious Diseases Clinical Pharmacist ?Phone: [phone] ?04/29/2021 11:32 AM ? ?

## 2021-04-29 NOTE — Progress Notes (Signed)
?   ? ? ? ? ?[facility] for Infectious Disease ? ?Date of Admission:  04/22/2021    ? ?Abx: ?3/13-c cefepime ?3/13-c vancomycin ?                                                             ?  ?  ?Assessment: ?56 yo female dm2 admitted on 3/13 for progressive ssi lle ?  ?She is s/p I&D by dr Sharol Given on 3/15; cx gpc in clusters -- final cx pending ?3/13 bcx ngtd ?  ?3/20 assessment ?Dr Sharol Given planned to take to or to I&D new left leg abscess. The original incision/wound appear grossly ok but with marked tenderness still ?No sign of new sepsis ?Tolerating vanc ?  ?Plan: ?Continue vanc. After surgery Wednesday, could consider transitioning to oral antibiotics ?Discussed with primary team ? ? ? ? ?Principal Problem: ?  Cellulitis of left leg ?Active Problems: ?  Dyslipidemia ?  Sepsis due to cellulitis [facility]) ?  Severe sepsis ([place]) ?  Acute kidney injury superimposed on chronic kidney disease ([place]) ?  Chest pain, unspecified ?  Uncontrolled type 2 diabetes mellitus with hyperglycemia, without long-term current use of insulin ([place]) ?  Abscess of left lower leg ?  Necrotizing fasciitis ([place]) ? ? ?Allergies  ?Allergen Reactions  ? Insulin Aspart Other (See Comments) and Swelling  ?  adverse reaction to Novolog ?  ? Latex Itching and Rash  ? Morphine Itching and Rash  ? ? ?Scheduled Meds: ? amLODipine  10 mg Oral Daily  ? aspirin EC  81 mg Oral Daily  ? atorvastatin  80 mg Oral Daily  ? carvedilol  25 mg Oral BID WC  ? cholecalciferol  400 Units Oral Daily  ? docusate sodium  100 mg Oral BID  ? enoxaparin (LOVENOX) injection  40 mg Subcutaneous Q24H  ? famotidine  20 mg Oral Daily  ? fluticasone  2 spray Each Nare Daily  ? fluticasone furoate-vilanterol  1 puff Inhalation Daily  ? gabapentin  600 mg Oral TID  ? insulin glargine-yfgn  20 Units Subcutaneous Daily  ? insulin regular  0-9 Units Subcutaneous TID AC  ? metFORMIN  500 mg Oral BID WC  ? sertraline  100 mg Oral Daily  ? topiramate  50 mg Oral BID  ? ?Continuous  Infusions: ? methocarbamol (ROBAXIN) IV    ? vancomycin Stopped (04/28/21 1505)  ? ?PRN Meds:.acetaminophen, albuterol, bisacodyl, busPIRone, HYDROmorphone (DILAUDID) injection, magnesium hydroxide, methocarbamol **OR** methocarbamol (ROBAXIN) IV, metoCLOPramide **OR** metoCLOPramide (REGLAN) injection, nitroGLYCERIN, ondansetron **OR** ondansetron (ZOFRAN) IV, oxyCODONE, oxyCODONE, polyethylene glycol, promethazine, SUMAtriptan, traZODone ? ? ?SUBJECTIVE: ?Wound vac removed ?Ortho plan to I&D the left knee abscess this Wednesday ? ?Afebrile ?No n/v/diarrhea ? ?Review of Systems: ?ROS ?All other ROS was negative, except mentioned above ? ? ? ? ?OBJECTIVE: ?Vitals:  ? 04/28/21 2109 04/29/21 0308 04/29/21 0727 04/29/21 0840  ?BP: 101/63 113/74 102/80 111/67  ?Pulse: 86 85 90 93  ?Resp:   18 16  ?Temp: 98.9 ?F (37.2 ?C)  98.5 ?F (36.9 ?C)   ?TempSrc: Oral  Oral   ?SpO2: 97% 96% 100%   ?Weight:      ?Height:      ? ?Body mass index is 22.92 kg/m?. ? ?Physical Exam ?General/constitutional: no distress, pleasant ?  HEENT: Normocephalic, PER, Conj Clear, EOMI, Oropharynx clear ?Neck supple ?CV: rrr no mrg ?Lungs: clear to auscultation, normal respiratory effort ?Abd: Soft, Nontender ?Ext: no edema ?Skin: No Rash ?Neuro: nonfocal ?MSK: left incision sutures intact no purulence, tender moderately along incision without obvious fluctuance; the left laterodistal knee joint fluctuance is starting to rupture ? ? ? ?Lab Results ?Lab Results  ?Component Value Date  ? WBC 10.5 04/29/2021  ? HGB 11.1 (L) 04/29/2021  ? HCT 35.0 (L) 04/29/2021  ? MCV 90.9 04/29/2021  ? PLT 434 (H) 04/29/2021  ?  ?Lab Results  ?Component Value Date  ? CREATININE 1.04 (H) 04/29/2021  ? BUN 14 04/29/2021  ? NA 137 04/29/2021  ? K 3.9 04/29/2021  ? CL 106 04/29/2021  ? CO2 21 (L) 04/29/2021  ?  ?Lab Results  ?Component Value Date  ? ALT 35 04/22/2021  ? AST 31 04/22/2021  ? ALKPHOS 87 04/22/2021  ? BILITOT 0.7 04/22/2021  ?  ? ? ?Microbiology: ?Recent  Results (from the past 240 hour(s))  ?Resp Panel by RT-PCR (Flu A&B, Covid) Nasopharyngeal Swab     Status: None  ? Collection Time: 04/19/21  4:01 PM  ? Specimen: Nasopharyngeal Swab; Nasopharyngeal(NP) swabs in vial transport medium  ?Result Value Ref Range Status  ? SARS Coronavirus 2 by RT PCR NEGATIVE NEGATIVE Final  ?  Comment: (NOTE) ?SARS-CoV-2 target nucleic acids are NOT DETECTED. ? ?The SARS-CoV-2 RNA is generally detectable in upper respiratory ?specimens during the acute phase of infection. The lowest ?concentration of SARS-CoV-2 viral copies this assay can detect is ?138 copies/mL. A negative result does not preclude SARS-Cov-2 ?infection and should not be used as the sole basis for treatment or ?other patient management decisions. A negative result may occur with  ?improper specimen collection/handling, submission of specimen other ?than nasopharyngeal swab, presence of viral mutation(s) within the ?areas targeted by this assay, and inadequate number of viral ?copies(<138 copies/mL). A negative result must be combined with ?clinical observations, patient history, and epidemiological ?information. The expected result is Negative. ? ?Fact Sheet for Patients:  ?EntrepreneurPulse.com.au ? ?Fact Sheet for Healthcare Providers:  ?IncredibleEmployment.be ? ?This test is no t yet approved or cleared by the [place] FDA and  ?has been authorized for detection and/or diagnosis of SARS-CoV-2 by ?FDA under an Emergency Use Authorization (EUA). This EUA will remain  ?in effect (meaning this test can be used) for the duration of the ?COVID-19 declaration under Section 564(b)(1) of the Act, 21 ?U.S.C.section 360bbb-3(b)(1), unless the authorization is terminated  ?or revoked sooner.  ? ? ?  ? Influenza A by PCR NEGATIVE NEGATIVE Final  ? Influenza B by PCR NEGATIVE NEGATIVE Final  ?  Comment: (NOTE) ?The Xpert Xpress SARS-CoV-2/FLU/RSV plus assay is intended as an aid ?in the  diagnosis of influenza from Nasopharyngeal swab specimens and ?should not be used as a sole basis for treatment. Nasal washings and ?aspirates are unacceptable for Xpert Xpress SARS-CoV-2/FLU/RSV ?testing. ? ?Fact Sheet for Patients: ?EntrepreneurPulse.com.au ? ?Fact Sheet for Healthcare Providers: ?IncredibleEmployment.be ? ?This test is not yet approved or cleared by the [place] FDA and ?has been authorized for detection and/or diagnosis of SARS-CoV-2 by ?FDA under an Emergency Use Authorization (EUA). This EUA will remain ?in effect (meaning this test can be used) for the duration of the ?COVID-19 declaration under Section 564(b)(1) of the Act, 21 U.S.C. ?section 360bbb-3(b)(1), unless the authorization is terminated or ?revoked. ? ?Performed at [facility], [address] [place], [place] [address] ?  ?Blood culture (routine x 2)     Status: None  ? Collection Time: 04/19/21  4:05 PM  ? Specimen: BLOOD LEFT ARM  ?Result Value Ref Range Status  ? Specimen Description   Final  ?  BLOOD LEFT ARM BLOOD ?Performed at [facility], [address] [place], [place] [address] ?  ? Special Requests   Final  ?  Blood Culture adequate volume BOTTLES DRAWN AEROBIC AND ANAEROBIC ?Performed at [facility], [address], [place], [place] [address] ?  ? Culture   Final  ?  NO GROWTH 5 DAYS ?Performed at [facility], [place] [address] [place], [place] [address] ?  ? Report Status 04/24/2021 FINAL  Final  ?Blood culture (routine x 2)     Status: None  ? Collection Time: 04/19/21  4:16 PM  ? Specimen: BLOOD RIGHT HAND  ?Result Value Ref Range Status  ? Specimen Description   Final  ?  BLOOD RIGHT HAND BLOOD ?Performed at [facility], [address], [place], [place] [address] ?  ? Special Requests   Final  ?  Blood Culture adequate volume BOTTLES DRAWN AEROBIC AND ANAEROBIC ?Performed at [facility], [address],  [place], [place] [address] ?  ? Culture   Final  ?  NO GROWTH 5 DAYS ?Performed at [facility], [place] [address] [place], [place] [address] ?  ? Report Status 04/24/2021 FINAL  Final  ?Blood Culture (routine x 2

## 2021-04-29 NOTE — Progress Notes (Signed)
Patient ID: [name], female   DOB: Mar 08, 1965, 56 y.o.   MRN: [id number] ?Patient has a new abscess over the lateral femoral condyle.  The wound VAC pump was not plugged in and the pump was off.  The dressing was removed and patient has some mild ischemic changes along the surgical incision but no cellulitis no signs of infection.  Patient's original infection was consistent with necrotizing fasciitis and I anticipate this new infection is the same infected fascia.  We will plan for surgical excisional debridement on Wednesday. ?

## 2021-04-29 NOTE — Progress Notes (Signed)
Mobility Specialist: Progress Note ? ? 04/29/21 1248  ?Mobility  ?Activity Ambulated with assistance in hallway  ?Level of Assistance Standby assist, set-up cues, supervision of patient - no hands on  ?Assistive Device Front wheel walker  ?LLE Weight Bearing WBAT  ?Distance Ambulated (ft) 380 ft  ?Activity Response Tolerated well  ?$Mobility charge 1 Mobility  ? ?Pt received in bed and agreeable to mobility. C/o LLE pain they rated 10/10 during mobility, otherwise asymptomatic. Pt sitting EOB after session with all needs met.  ? ?Harrell Gave Tajha Sammarco ?Mobility Specialist ?Mobility Specialist [place]: [phone] ?Mobility Specialist [place]: [phone] ? ?

## 2021-04-30 LAB — GLUCOSE, CAPILLARY
Glucose-Capillary: 239 mg/dL — ABNORMAL HIGH (ref 70–99)
Glucose-Capillary: 210 mg/dL — ABNORMAL HIGH (ref 70–99)
Glucose-Capillary: 148 mg/dL — ABNORMAL HIGH (ref 70–99)

## 2021-04-30 MED ORDER — OXYCODONE-ACETAMINOPHEN 5-325 MG PO TABS
1.0000 | ORAL_TABLET | Freq: Four times a day (QID) | ORAL | Status: DC | PRN
  Filled 2021-04-30: qty 1

## 2021-04-30 MED ORDER — HYDROMORPHONE HCL 1 MG/ML IJ SOLN
0.5000 mg | Freq: Four times a day (QID) | INTRAMUSCULAR | Status: DC | PRN
  Administered 2021-04-30 – 2021-05-02 (×6): 0.5 mg via INTRAVENOUS
  Filled 2021-04-30 (×6): qty 0.5

## 2021-04-30 NOTE — Progress Notes (Signed)
Patient ID: [name], female   DOB: [birth year], 56 y.o.   MRN: [id number] ?Patient is status post debridement of necrotizing fasciitis left calf.  She has developed a new ulcer more proximally.  The CT scan does not show any large abscess.  We will plan for surgical debridement tomorrow of the lateral left knee wound.  We will send fascia and soft tissue for cultures. ?

## 2021-04-30 NOTE — Plan of Care (Signed)
Pt alert and oriented x 4.  ?Med complaint. Pt still taking dilaudid every 4-5 hours and oxy approx every 6-7. Robaxin every 8. Pt hasn't had bm since 3/11. Pt refused to take a ducolax supp.  ?Problem: Pain Managment: ?Goal: General experience of comfort will improve ?Outcome: Not Progressing ?  ?Problem: Clinical Measurements: ?Goal: Ability to maintain clinical measurements within normal limits will improve ?Outcome: Progressing ?Goal: Will remain free from infection ?Outcome: Progressing ?Goal: Diagnostic test results will improve ?Outcome: Progressing ?Goal: Respiratory complications will improve ?Outcome: Progressing ?Goal: Cardiovascular complication will be avoided ?Outcome: Progressing ?  ?Problem: Activity: ?Goal: Risk for activity intolerance will decrease ?Outcome: Progressing ?  ?Problem: Nutrition: ?Goal: Adequate nutrition will be maintained ?Outcome: Progressing ?  ?Problem: Coping: ?Goal: Level of anxiety will decrease ?Outcome: Progressing ?  ?Problem: Elimination: ?Goal: Will not experience complications related to bowel motility ?Outcome: Progressing ?Goal: Will not experience complications related to urinary retention ?Outcome: Progressing ?  ?Problem: Safety: ?Goal: Ability to remain free from injury will improve ?Outcome: Progressing ?  ?Problem: Skin Integrity: ?Goal: Risk for impaired skin integrity will decrease ?Outcome: Progressing ?  ?

## 2021-04-30 NOTE — Progress Notes (Signed)
Mobility Specialist: Progress Note ? ? 04/30/21 1229  ?Mobility  ?Activity Ambulated with assistance in hallway  ?Level of Assistance Minimal assist, patient does 75% or more  ?Assistive Device Front wheel walker  ?LLE Weight Bearing WBAT  ?Distance Ambulated (ft) 390 ft  ?Activity Response Tolerated well  ?$Mobility charge 1 Mobility  ? ?Post-Mobility: 77 HR, 120/87 BP, 100% SpO2 ? ?Pt received in bed and agreeable to ambulation. C/o some LLE pain during session, no rating given. Pt back to bed after walk with call bell and phone in reach. Bed alarm is on.  ? ?Harrell Gave Dahir Ayer ?Mobility Specialist ?Mobility Specialist [place]: [phone] ?Mobility Specialist [place]: [phone] ? ?

## 2021-04-30 NOTE — Plan of Care (Signed)
?  Problem: Clinical Measurements: ?Goal: Ability to maintain clinical measurements within normal limits will improve ?Outcome: Not Progressing ?Goal: Will remain free from infection ?Outcome: Not Progressing ?Goal: Diagnostic test results will improve ?Outcome: Not Progressing ?Goal: Respiratory complications will improve ?Outcome: Not Progressing ?Goal: Cardiovascular complication will be avoided ?Outcome: Not Progressing ?  ?Problem: Activity: ?Goal: Risk for activity intolerance will decrease ?Outcome: Not Progressing ?  ?Problem: Nutrition: ?Goal: Adequate nutrition will be maintained ?Outcome: Not Progressing ?  ?Problem: Elimination: ?Goal: Will not experience complications related to bowel motility ?Outcome: Not Progressing ?Goal: Will not experience complications related to urinary retention ?Outcome: Not Progressing ?  ?Problem: Pain Managment: ?Goal: General experience of comfort will improve ?Outcome: Not Progressing ?  ?Problem: Safety: ?Goal: Ability to remain free from injury will improve ?Outcome: Not Progressing ?  ?

## 2021-04-30 NOTE — H&P (View-Only) (Signed)
Patient ID: [name], female   DOB: 28-Dec-1965, 56 y.o.   MRN: [id number] ?Patient is status post debridement of necrotizing fasciitis left calf.  She has developed a new ulcer more proximally.  The CT scan does not show any large abscess.  We will plan for surgical debridement tomorrow of the lateral left knee wound.  We will send fascia and soft tissue for cultures. ?

## 2021-04-30 NOTE — Progress Notes (Signed)
?PROGRESS NOTE ? ? ? ?[name]  KZS:[id number] DOB: [birth year] DOA: 04/22/2021 ?PCP: Mackie Pai, PA-C  ? ?  ?Brief Narrative:  ?56 year old female with a history of type 2 diabetes, hypertension, hyperlipidemia, CAD, cardiomyopathy, CKD, GERD, and depression presenting on 3/13 with left leg cellulitis failing outpatient doxycycline.  She first noticed on 3/9 and enlarged on 3/11.  On 3/15, she underwent a left leg excisional debridement with wound VAC placement by the orthopedic surgery team. ? ?04/29/2021: Patient seen.  Available records reviewed.  Also discussed with the infectious disease team.  Patient remains on IV vancomycin.  Patient admitted need another I&D.  No complaints from the patient.  No fever or chills, no shortness of breath.  No headache or neck pain reported. ? ?04/30/2021: Patient has new abscess lateral to the left knee.  Orthopedic team's input is highly appreciated.  Patient will undergo surgical debridement of the left or left knee wound tomorrow.  No new complaints today. ? ?Assessment & Plan: ?  ?Principal Problem: ?  Cellulitis of left leg ?Active Problems: ?  Sepsis due to cellulitis [facility]) ?  Severe sepsis ([place]) ?  Chest pain, unspecified ?  Acute kidney injury superimposed on chronic kidney disease ([place]) ?  Uncontrolled type 2 diabetes mellitus with hyperglycemia, without long-term current use of insulin ([place]) ?  Dyslipidemia ?  Abscess of left lower leg ?  Necrotizing fasciitis ([place]) ? ?Cellulitis of left leg with abscess/possible necrotizing fasciitis; severe sepsis ? Cellulitis seems to be worsening despite no changes in her numbers, will consult infectious disease ?            Appreciate orthopedic surgery ?            Dilaudid as needed for severe pain ?            Oxycodone as needed for moderate pain ?            Will continue cefepime and vancomycin ?            Has been afebrile, no leukocytosis, lactate is normalized ?            Wound cultures pending, blood  cultures show no growth to date ?            Sepsis has resolved ?            Robaxin 500 mg every 6 hours as needed ? ?04/29/2021: Patient is only on IV vancomycin.  Cefepime has been discontinued.  For I&D in the morning.   ?  ?Acute kidney injury superimposed on CKD ?            Resolved ?Continue to monitor renal function and electrolytes.  ?  ?Uncontrolled type 2 diabetes with hyperglycemia and diabetic neuropathy ?            Semglee 20 u in the morning, metformin 500 mg twice daily restarted ?            Continue gabapentin    ?            Low-carb diet ?            If refusing insulin moving forward, will consider moderate dose of a sulfonylurea like glyburide 5 mg twice daily as she is not insulin na?ve ?04/29/2021: Patient is currently on subcutaneous Semglee 20 units once daily, sliding scale insulin coverage, metformin 500 Mg p.o. twice daily ?  ?Dyslipidemia, CAD ?  Continue Lipitor 80 mg daily ?            Continue baby aspirin with history of CAD ?  ?CHF/essential hypertension ?            Continue Norvasc 10 mg daily, Coreg 25 mg twice daily ?04/29/2021: Blood pressures controlled. ?  ?Migraines/depression ?            Continue Topamax 50 mg twice daily ?            Continue Zoloft 100 mg daily, trazodone 25 mg at night as needed for sleep ? ?DVT prophylaxis: Lovenox ?Code Status: Full ?Family Communication: Self ?Coming From: Home  ?Disposition Plan: Home ?Barriers to Discharge: Clinical improvement ?  ?Consultants:  ?Orthopedic surgery ?Infectious disease ?  ?Procedures:  ?04/24/21-left excisional wound debridement and wound VAC placement ? ?Antimicrobials:  ?Anti-infectives (From admission, onward)  ? ? Start     Dose/Rate Route Frequency Ordered Stop  ? 04/24/21 1200  vancomycin (VANCOREADY) IVPB 1500 mg/300 mL       ? 1,500 mg ?150 mL/hr over 120 Minutes Intravenous Every 24 hours 04/23/21 1455    ? 04/24/21 0600  ceFAZolin (ANCEF) IVPB 2g/100 mL premix       ? 2 g ?200 mL/hr over 30  Minutes Intravenous On call to O.R. 04/23/21 1928 04/24/21 1650  ? 04/23/21 1200  vancomycin (VANCOREADY) IVPB 1250 mg/250 mL  Status:  Discontinued       ? 1,250 mg ?166.7 mL/hr over 90 Minutes Intravenous Every 24 hours 04/22/21 1342 04/23/21 1455  ? 04/23/21 1000  ceFEPIme (MAXIPIME) 2 g in sodium chloride 0.9 % 100 mL IVPB  Status:  Discontinued       ? 2 g ?200 mL/hr over 30 Minutes Intravenous Every 12 hours 04/22/21 2149 04/28/21 1711  ? 04/22/21 2245  metroNIDAZOLE (FLAGYL) tablet 500 mg  Status:  Discontinued       ? 500 mg Oral Every 12 hours 04/22/21 2155 04/22/21 2257  ? 04/22/21 2230  ceFEPIme (MAXIPIME) 2 g in sodium chloride 0.9 % 100 mL IVPB       ? 2 g ?200 mL/hr over 30 Minutes Intravenous  Once 04/22/21 2135 04/22/21 2314  ? 04/22/21 2230  metroNIDAZOLE (FLAGYL) IVPB 500 mg  Status:  Discontinued       ? 500 mg ?100 mL/hr over 60 Minutes Intravenous Every 12 hours 04/22/21 2135 04/22/21 2155  ? 04/22/21 2230  vancomycin (VANCOCIN) IVPB 1000 mg/200 mL premix  Status:  Discontinued       ? 1,000 mg ?200 mL/hr over 60 Minutes Intravenous  Once 04/22/21 2135 04/22/21 2155  ? 04/22/21 1345  vancomycin (VANCOCIN) IVPB 1000 mg/200 mL premix       ? 1,000 mg ?200 mL/hr over 60 Minutes Intravenous  Once 04/22/21 1338 04/22/21 1506  ? ?  ? ? ? ?Objective: ?Vitals:  ? 04/30/21 0049 04/30/21 0539 04/30/21 0174 04/30/21 0853  ?BP: 125/83 116/80 116/74   ?Pulse: 83 79 79 84  ?Resp: '16 18 18 16  '$ ?Temp:  97.8 ?F (36.6 ?C) 97.9 ?F (36.6 ?C)   ?TempSrc:   Oral   ?SpO2: 94% 98% 97% 95%  ?Weight:      ?Height:      ? ?No intake or output data in the 24 hours ending 04/30/21 1210 ? ?Filed Weights  ? 04/22/21 1219 04/22/21 1253  ?Weight: 76.7 kg 76.7 kg  ? ? ?Examination:  ?General exam: Appears calm.  Patient is  resting quietly.  Not in any distress.  Patient is awake and alert.  Facial hair noted. ?HEENT: Not pale.  No jaundice.  Dry buccal mucosa. ?Respiratory system: Clear to auscultation.  ?Cardiovascular system:  S1 & S2 heard ?Gastrointestinal system: Abdomen is nondistended, soft and nontender. Normal bowel sounds heard. ?Central nervous system: Alert and oriented.  Patient moves all extremities.  Extremities: Status post I&D of left lower leg with stitches.  Patient has poor posterior aspect of left lower leg (closer to posterior aspect of the knee). ?  ? ?Data Reviewed: I have personally reviewed following labs and imaging studies ? ?CBC: ?Recent Labs  ?Lab 04/25/21 ?0200 04/26/21 ?0232 04/27/21 ?4401 04/28/21 ?0272 04/29/21 ?0229  ?WBC 8.0 13.4* 10.9* 10.9* 10.5  ?NEUTROABS 5.9 10.0* 6.5 7.2 6.5  ?HGB 9.4* 9.9* 9.8* 10.4* 11.1*  ?HCT 30.6* 30.2* 31.1* 32.3* 35.0*  ?MCV 89.2 91.2 92.3 91.8 90.9  ?PLT 201 397 436* 455* 434*  ? ? ?Basic Metabolic Panel: ?Recent Labs  ?Lab 04/25/21 ?0200 04/26/21 ?0232 04/27/21 ?5366 04/28/21 ?4403 04/29/21 ?0229  ?NA 137 136 141 136 137  ?K 4.2 4.8 4.3 3.9 3.9  ?CL 99 105 109 104 106  ?CO2 '25 24 22 24 '$ 21*  ?GLUCOSE 151* 359* 164* 126* 176*  ?BUN 25* '17 13 13 14  '$ ?CREATININE 1.08* 1.21* 1.08* 0.97 1.04*  ?CALCIUM 8.9 8.4* 8.5* 8.5* 8.6*  ? ? ?GFR: ?Estimated Creatinine Clearance: 69.7 mL/min (A) (by C-G formula based on SCr of 1.04 mg/dL (H)). ? ?Liver Function Tests: ?No results for input(s): AST, ALT, ALKPHOS, BILITOT, PROT, ALBUMIN in the last 168 hours. ? ?Coagulation Profile: ?No results for input(s): INR, PROTIME in the last 168 hours. ? ?CBG: ?Recent Labs  ?Lab 04/28/21 ?1522 04/28/21 ?1932 04/29/21 ?1625 04/29/21 ?1957 04/30/21 ?1157  ?GLUCAP 110* 176* 163* 104* 239*  ? ? ?Sepsis Labs: ?Recent Labs  ?Lab 04/27/21 ?0245  ?LATICACIDVEN 1.1  ? ? ? ?Recent Results (from the past 240 hour(s))  ?Blood Culture (routine x 2)     Status: None  ? Collection Time: 04/22/21 12:49 PM  ? Specimen: BLOOD  ?Result Value Ref Range Status  ? Specimen Description   Final  ?  BLOOD Blood Culture adequate volume ?Performed at [facility], [address], [place], [place] [address] ?  ?  Special Requests   Final  ?  BOTTLES DRAWN AEROBIC AND ANAEROBIC BLOOD RIGHT FOREARM ?Performed at [facility], [address], [place], [place] [address] ?  ? Culture   Final  ?  NO GROWTH 5 DAYS ?Perf

## 2021-05-01 ENCOUNTER — Encounter (HOSPITAL_COMMUNITY)
Admission: EM | Disposition: A | Discharge status: Home / Self Care | Payer: Self-pay | Source: Home / Self Care | Attending: Internal Medicine

## 2021-05-01 ENCOUNTER — Inpatient Hospital Stay (HOSPITAL_COMMUNITY): Payer: Self-pay | Admitting: Certified Registered Nurse Anesthetist

## 2021-05-01 ENCOUNTER — Encounter (HOSPITAL_COMMUNITY): Payer: Self-pay | Admitting: Internal Medicine

## 2021-05-01 DIAGNOSIS — I2510 Atherosclerotic heart disease of native coronary artery without angina pectoris: Secondary | ICD-10-CM

## 2021-05-01 DIAGNOSIS — I10 Essential (primary) hypertension: Secondary | ICD-10-CM

## 2021-05-01 DIAGNOSIS — L02416 Cutaneous abscess of left lower limb: Secondary | ICD-10-CM

## 2021-05-01 DIAGNOSIS — E119 Type 2 diabetes mellitus without complications: Secondary | ICD-10-CM

## 2021-05-01 HISTORY — PX: I & D EXTREMITY: SHX5045

## 2021-05-01 LAB — BASIC METABOLIC PANEL
Anion gap: 7 (ref 5–15)
BUN: 11 mg/dL (ref 6–20)
CO2: 26 mmol/L (ref 22–32)
Calcium: 9.1 mg/dL (ref 8.9–10.3)
Chloride: 108 mmol/L (ref 98–111)
Creatinine, Ser: 0.85 mg/dL (ref 0.44–1.00)
GFR, Estimated: >60 mL/min (ref >60)
Glucose, Bld: 77 mg/dL (ref 70–99)
Potassium: 4.1 mmol/L (ref 3.5–5.1)
Sodium: 141 mmol/L (ref 135–145)

## 2021-05-01 LAB — GLUCOSE, CAPILLARY
Glucose-Capillary: 318 mg/dL — ABNORMAL HIGH (ref 70–99)
Glucose-Capillary: 411 mg/dL — ABNORMAL HIGH (ref 70–99)
Glucose-Capillary: 62 mg/dL — ABNORMAL LOW (ref 70–99)
Glucose-Capillary: 69 mg/dL — ABNORMAL LOW (ref 70–99)
Glucose-Capillary: 79 mg/dL (ref 70–99)
Glucose-Capillary: 82 mg/dL (ref 70–99)
Glucose-Capillary: 84 mg/dL (ref 70–99)
Glucose-Capillary: 98 mg/dL (ref 70–99)

## 2021-05-01 LAB — CBC WITH DIFFERENTIAL/PLATELET
Abs Immature Granulocytes: 0.05 10*3/uL (ref 0.00–0.07)
Basophils Absolute: 0 10*3/uL (ref 0.0–0.1)
Basophils Relative: 0 %
Eosinophils Absolute: 0.3 10*3/uL (ref 0.0–0.5)
Eosinophils Relative: 4 %
HCT: 34.5 % — ABNORMAL LOW (ref 36.0–46.0)
Hemoglobin: 10.9 g/dL — ABNORMAL LOW (ref 12.0–15.0)
Immature Granulocytes: 1 %
Lymphocytes Relative: 22 %
Lymphs Abs: 2 10*3/uL (ref 0.7–4.0)
MCH: 28.9 pg (ref 26.0–34.0)
MCHC: 31.6 g/dL (ref 30.0–36.0)
MCV: 91.5 fL (ref 80.0–100.0)
Monocytes Absolute: 0.8 10*3/uL (ref 0.1–1.0)
Monocytes Relative: 8 %
Neutro Abs: 5.8 10*3/uL (ref 1.7–7.7)
Neutrophils Relative %: 65 %
Platelets: 502 10*3/uL — ABNORMAL HIGH (ref 150–400)
RBC: 3.77 MIL/uL — ABNORMAL LOW (ref 3.87–5.11)
RDW: 14.7 % (ref 11.5–15.5)
WBC: 9 10*3/uL (ref 4.0–10.5)
nRBC: 0 % (ref 0.0–0.2)

## 2021-05-01 SURGERY — IRRIGATION AND DEBRIDEMENT EXTREMITY
Anesthesia: General | Laterality: Left

## 2021-05-01 MED ORDER — ONDANSETRON HCL 4 MG/2ML IJ SOLN
INTRAMUSCULAR | Status: AC
  Filled 2021-05-01: qty 2

## 2021-05-01 MED ORDER — PHENYLEPHRINE 40 MCG/ML (10ML) SYRINGE FOR IV PUSH (FOR BLOOD PRESSURE SUPPORT)
PREFILLED_SYRINGE | INTRAVENOUS | Status: AC
  Filled 2021-05-01: qty 10

## 2021-05-01 MED ORDER — ONDANSETRON HCL 4 MG PO TABS: 4.0000 mg | ORAL_TABLET | Freq: Four times a day (QID) | ORAL | Status: DC | PRN

## 2021-05-01 MED ORDER — PHENYLEPHRINE 40 MCG/ML (10ML) SYRINGE FOR IV PUSH (FOR BLOOD PRESSURE SUPPORT)
PREFILLED_SYRINGE | INTRAVENOUS | Status: AC
  Filled 2021-05-01: qty 40

## 2021-05-01 MED ORDER — PROPOFOL 10 MG/ML IV BOLUS
INTRAVENOUS | Status: DC | PRN
  Administered 2021-05-01: 140 mg via INTRAVENOUS

## 2021-05-01 MED ORDER — OXYCODONE HCL 5 MG PO TABS: 5.0000 mg | ORAL_TABLET | Freq: Once | ORAL | Status: DC | PRN

## 2021-05-01 MED ORDER — CHLORHEXIDINE GLUCONATE 4 % EX LIQD
60.0000 mL | Freq: Once | CUTANEOUS | Status: DC
  Filled 2021-05-01: qty 60

## 2021-05-01 MED ORDER — FENTANYL CITRATE (PF) 250 MCG/5ML IJ SOLN
INTRAMUSCULAR | Status: AC
  Filled 2021-05-01: qty 5

## 2021-05-01 MED ORDER — MIDAZOLAM HCL 2 MG/2ML IJ SOLN
INTRAMUSCULAR | Status: AC
  Filled 2021-05-01: qty 2

## 2021-05-01 MED ORDER — ONDANSETRON HCL 4 MG/2ML IJ SOLN: 4.0000 mg | Freq: Once | INTRAMUSCULAR | Status: DC | PRN

## 2021-05-01 MED ORDER — LIDOCAINE 2% (20 MG/ML) 5 ML SYRINGE
INTRAMUSCULAR | Status: AC
  Filled 2021-05-01: qty 5

## 2021-05-01 MED ORDER — PHENYLEPHRINE 40 MCG/ML (10ML) SYRINGE FOR IV PUSH (FOR BLOOD PRESSURE SUPPORT)
PREFILLED_SYRINGE | INTRAVENOUS | Status: DC | PRN
  Administered 2021-05-01 (×2): 120 ug via INTRAVENOUS
  Administered 2021-05-01: 80 ug via INTRAVENOUS
  Administered 2021-05-01: 200 ug via INTRAVENOUS

## 2021-05-01 MED ORDER — PROPOFOL 10 MG/ML IV BOLUS
INTRAVENOUS | Status: AC
  Filled 2021-05-01: qty 20

## 2021-05-01 MED ORDER — DEXAMETHASONE SODIUM PHOSPHATE 10 MG/ML IJ SOLN
INTRAMUSCULAR | Status: DC | PRN
  Administered 2021-05-01: 5 mg via INTRAVENOUS

## 2021-05-01 MED ORDER — ACETAMINOPHEN 500 MG PO TABS
ORAL_TABLET | ORAL | Status: AC
  Administered 2021-05-01: 1000 mg via ORAL
  Filled 2021-05-01: qty 2

## 2021-05-01 MED ORDER — LACTATED RINGERS IV SOLN: INTRAVENOUS | Status: DC | PRN

## 2021-05-01 MED ORDER — ONDANSETRON HCL 4 MG/2ML IJ SOLN
INTRAMUSCULAR | Status: DC | PRN
  Administered 2021-05-01: 4 mg via INTRAVENOUS

## 2021-05-01 MED ORDER — GLYCOPYRROLATE PF 0.2 MG/ML IJ SOSY
PREFILLED_SYRINGE | INTRAMUSCULAR | Status: AC
  Filled 2021-05-01: qty 1

## 2021-05-01 MED ORDER — CHLORHEXIDINE GLUCONATE 0.12 % MT SOLN
OROMUCOSAL | Status: AC
  Administered 2021-05-01: 30 mL
  Filled 2021-05-01: qty 15

## 2021-05-01 MED ORDER — METHOCARBAMOL 500 MG PO TABS
500.0000 mg | ORAL_TABLET | Freq: Four times a day (QID) | ORAL | Status: DC | PRN
  Administered 2021-05-01 – 2021-05-03 (×4): 500 mg via ORAL
  Filled 2021-05-01 (×4): qty 1

## 2021-05-01 MED ORDER — ACETAMINOPHEN 500 MG PO TABS: 1000.0000 mg | ORAL_TABLET | Freq: Once | ORAL | Status: AC

## 2021-05-01 MED ORDER — DEXAMETHASONE SODIUM PHOSPHATE 10 MG/ML IJ SOLN
INTRAMUSCULAR | Status: AC
  Filled 2021-05-01: qty 1

## 2021-05-01 MED ORDER — SCOPOLAMINE 1 MG/3DAYS TD PT72
MEDICATED_PATCH | TRANSDERMAL | Status: AC
  Filled 2021-05-01: qty 1

## 2021-05-01 MED ORDER — EPHEDRINE SULFATE-NACL 50-0.9 MG/10ML-% IV SOSY
PREFILLED_SYRINGE | INTRAVENOUS | Status: DC | PRN
  Administered 2021-05-01: 10 mg via INTRAVENOUS
  Administered 2021-05-01: 5 mg via INTRAVENOUS
  Administered 2021-05-01: 10 mg via INTRAVENOUS

## 2021-05-01 MED ORDER — EPHEDRINE 5 MG/ML INJ
INTRAVENOUS | Status: AC
  Filled 2021-05-01: qty 5

## 2021-05-01 MED ORDER — CEFAZOLIN SODIUM-DEXTROSE 2-4 GM/100ML-% IV SOLN: 2.0000 g | INTRAVENOUS | Status: DC

## 2021-05-01 MED ORDER — ROCURONIUM BROMIDE 10 MG/ML (PF) SYRINGE
PREFILLED_SYRINGE | INTRAVENOUS | Status: AC
  Filled 2021-05-01: qty 10

## 2021-05-01 MED ORDER — METOCLOPRAMIDE HCL 5 MG PO TABS: 5.0000 mg | ORAL_TABLET | Freq: Three times a day (TID) | ORAL | Status: DC | PRN

## 2021-05-01 MED ORDER — METOCLOPRAMIDE HCL 5 MG/ML IJ SOLN: 5.0000 mg | Freq: Three times a day (TID) | INTRAMUSCULAR | Status: DC | PRN

## 2021-05-01 MED ORDER — POLYETHYLENE GLYCOL 3350 17 G PO PACK: 17.0000 g | PACK | Freq: Every day | ORAL | Status: DC | PRN

## 2021-05-01 MED ORDER — ONDANSETRON HCL 4 MG/2ML IJ SOLN: 4.0000 mg | Freq: Four times a day (QID) | INTRAMUSCULAR | Status: DC | PRN

## 2021-05-01 MED ORDER — DEXAMETHASONE SODIUM PHOSPHATE 10 MG/ML IJ SOLN
INTRAMUSCULAR | Status: AC
  Filled 2021-05-01: qty 2

## 2021-05-01 MED ORDER — BISACODYL 10 MG RE SUPP: 10.0000 mg | Freq: Every day | RECTAL | Status: DC | PRN

## 2021-05-01 MED ORDER — LIDOCAINE 2% (20 MG/ML) 5 ML SYRINGE
INTRAMUSCULAR | Status: DC | PRN
  Administered 2021-05-01: 80 mg via INTRAVENOUS

## 2021-05-01 MED ORDER — FENTANYL CITRATE (PF) 100 MCG/2ML IJ SOLN
INTRAMUSCULAR | Status: DC | PRN
  Administered 2021-05-01: 25 ug via INTRAVENOUS

## 2021-05-01 MED ORDER — POVIDONE-IODINE 10 % EX SWAB: 2.0000 "application " | Freq: Once | CUTANEOUS | Status: DC

## 2021-05-01 MED ORDER — METHOCARBAMOL 1000 MG/10ML IJ SOLN
500.0000 mg | Freq: Four times a day (QID) | INTRAVENOUS | Status: DC | PRN
  Filled 2021-05-01: qty 5

## 2021-05-01 MED ORDER — FENTANYL CITRATE (PF) 100 MCG/2ML IJ SOLN: 25.0000 ug | INTRAMUSCULAR | Status: DC | PRN

## 2021-05-01 MED ORDER — GLYCOPYRROLATE 0.2 MG/ML IJ SOLN
INTRAMUSCULAR | Status: DC | PRN
  Administered 2021-05-01: .2 mg via INTRAVENOUS

## 2021-05-01 MED ORDER — SODIUM CHLORIDE 0.9 % IV SOLN: INTRAVENOUS | Status: DC

## 2021-05-01 MED ORDER — SCOPOLAMINE 1 MG/3DAYS TD PT72
MEDICATED_PATCH | TRANSDERMAL | Status: DC | PRN
  Administered 2021-05-01: 1 via TRANSDERMAL

## 2021-05-01 MED ORDER — OXYCODONE HCL 5 MG/5ML PO SOLN: 5.0000 mg | Freq: Once | ORAL | Status: DC | PRN

## 2021-05-01 MED ORDER — 0.9 % SODIUM CHLORIDE (POUR BTL) OPTIME
TOPICAL | Status: DC | PRN
  Administered 2021-05-01: 1000 mL

## 2021-05-01 MED ORDER — AMISULPRIDE (ANTIEMETIC) 5 MG/2ML IV SOLN: 10.0000 mg | Freq: Once | INTRAVENOUS | Status: DC | PRN

## 2021-05-01 MED ORDER — DOCUSATE SODIUM 100 MG PO CAPS
100.0000 mg | ORAL_CAPSULE | Freq: Two times a day (BID) | ORAL | Status: DC
  Administered 2021-05-01 – 2021-05-02 (×2): 100 mg via ORAL
  Filled 2021-05-01 (×2): qty 1

## 2021-05-01 MED ORDER — MIDAZOLAM HCL 2 MG/2ML IJ SOLN
INTRAMUSCULAR | Status: DC | PRN
  Administered 2021-05-01: 2 mg via INTRAVENOUS

## 2021-05-01 SURGICAL SUPPLY — 28 items
BAG COUNTER SPONGE SURGICOUNT (BAG) ×1 IMPLANT
BLADE SURG 21 STRL SS (BLADE) ×2 IMPLANT
BNDG COHESIVE 6X5 TAN STRL LF (GAUZE/BANDAGES/DRESSINGS) ×2 IMPLANT
BNDG GAUZE ELAST 4 BULKY (GAUZE/BANDAGES/DRESSINGS) ×3 IMPLANT
CNTNR URN SCR LID CUP LEK RST (MISCELLANEOUS) IMPLANT
CONT SPEC 4OZ STRL OR WHT (MISCELLANEOUS) ×2
COVER SURGICAL LIGHT HANDLE (MISCELLANEOUS) ×4 IMPLANT
DRAPE DERMATAC (DRAPES) IMPLANT
DRAPE U-SHAPE 47X51 STRL (DRAPES) ×2 IMPLANT
DRSG ADAPTIC 3X8 NADH LF (GAUZE/BANDAGES/DRESSINGS) ×2 IMPLANT
DURAPREP 26ML APPLICATOR (WOUND CARE) ×2 IMPLANT
ELECT REM PT RETURN 9FT ADLT (ELECTROSURGICAL) ×2
ELECTRODE REM PT RTRN 9FT ADLT (ELECTROSURGICAL) IMPLANT
GAUZE SPONGE 4X4 12PLY STRL (GAUZE/BANDAGES/DRESSINGS) ×2 IMPLANT
GLOVE SURG UNDER POLY LF SZ9 (GLOVE) ×2 IMPLANT
GOWN STRL REUS W/ TWL XL LVL3 (GOWN DISPOSABLE) ×2 IMPLANT
GOWN STRL REUS W/TWL XL LVL3 (GOWN DISPOSABLE) ×4
KIT BASIN OR (CUSTOM PROCEDURE TRAY) ×2 IMPLANT
KIT TURNOVER KIT B (KITS) ×2 IMPLANT
MANIFOLD NEPTUNE II (INSTRUMENTS) ×2 IMPLANT
NS IRRIG 1000ML POUR BTL (IV SOLUTION) ×2 IMPLANT
PACK ORTHO EXTREMITY (CUSTOM PROCEDURE TRAY) ×2 IMPLANT
STOCKINETTE IMPERVIOUS 9X36 MD (GAUZE/BANDAGES/DRESSINGS) ×1 IMPLANT
SUT ETHILON 2 0 PSLX (SUTURE) ×2 IMPLANT
SWAB COLLECTION DEVICE MRSA (MISCELLANEOUS) ×2 IMPLANT
TOWEL GREEN STERILE (TOWEL DISPOSABLE) ×2 IMPLANT
TUBE CONNECTING 12X1/4 (SUCTIONS) ×2 IMPLANT
YANKAUER SUCT BULB TIP NO VENT (SUCTIONS) ×2 IMPLANT

## 2021-05-01 NOTE — Interval H&P Note (Signed)
History and Physical Interval Note: ? ?05/01/2021 ?7:18 AM ? ?[name]  has presented today for surgery, with the diagnosis of Abscess Left Knee.  The various methods of treatment have been discussed with the patient and family. After consideration of risks, benefits and other options for treatment, the patient has consented to  Procedure(s): ?LEFT KNEE DEBRIDEMENT (Left) as a surgical intervention.  The patient's history has been reviewed, patient examined, no change in status, stable for surgery.  I have reviewed the patient's chart and labs.  Questions were answered to the patient's satisfaction.   ? ? ?[name] ? ? ?

## 2021-05-01 NOTE — Plan of Care (Signed)
?  Problem: Clinical Measurements: ?Goal: Diagnostic test results will improve ?Outcome: Progressing ?  ?Problem: Activity: ?Goal: Risk for activity intolerance will decrease ?Outcome: Progressing ?  ?Problem: Nutrition: ?Goal: Adequate nutrition will be maintained ?Outcome: Progressing ?  ?Problem: Coping: ?Goal: Level of anxiety will decrease ?Outcome: Progressing ?  ?Problem: Safety: ?Goal: Ability to remain free from injury will improve ?Outcome: Progressing ?  ?

## 2021-05-01 NOTE — Anesthesia Preprocedure Evaluation (Signed)
Anesthesia Evaluation  ?Patient identified by MRN, date of birth, ID band ?Patient awake ? ? ? ?Reviewed: ?Allergy & Precautions, NPO status , Patient's Chart, lab work & pertinent test results ? ?History of Anesthesia Complications ?Negative for: history of anesthetic complications ? ?Airway ?Mallampati: II ? ?TM Distance: >3 FB ?Neck ROM: Full ? ? ? Dental ? ?(+) Dental Advisory Given ?  ?Pulmonary ?neg pulmonary ROS,  ?  ?breath sounds clear to auscultation ? ? ? ? ? ? Cardiovascular ?hypertension, Pt. on medications and Pt. on home beta blockers ?(-) angina+ CAD  ?+ Valvular Problems/Murmurs  ?Rhythm:Regular Rate:Normal ? ?04/23/2021 Echo  ??1. Left ventricular ejection fraction, by estimation, is 60 to 65%. The left ventricle has normal function. The left ventricle has no regional wall motion abnormalities. Left ventricular diastolic parameters were normal.  ??2. Right ventricular systolic function is normal. The right ventricular size is normal.  ??3. The mitral valve is normal in structure. No evidence of mitral valve regurgitation. No evidence of mitral stenosis.  ??4. The aortic valve is normal in structure. Aortic valve regurgitation is not visualized. No aortic stenosis is present.  ??5. The inferior vena cava is normal in size with greater than 50% respiratory variability, suggesting right atrial pressure of 3 mmHg.  ?  ?Neuro/Psych ? Headaches, PSYCHIATRIC DISORDERS Anxiety Depression   ? GI/Hepatic ?Neg liver ROS, PUD, GERD  Medicated and Controlled,  ?Endo/Other  ?diabetes, Oral Hypoglycemic Agents ? Renal/GU ?Renal InsufficiencyRenal disease  ? ?  ?Musculoskeletal ? ? Abdominal ?  ?Peds ? Hematology ? ?(+) Blood dyscrasia (Hb 10.2), anemia ,   ?Anesthesia Other Findings ? ? Reproductive/Obstetrics ? ?  ? ? ? ? ? ? ? ? ? ? ? ? ? ?  ?  ? ? ? ? ? ? ? ?Anesthesia Physical ? ?Anesthesia Plan ? ?ASA: 3 ? ?Anesthesia Plan: General  ? ?Post-op Pain Management: Tylenol PO  (pre-op)*  ? ?Induction: Intravenous ? ?PONV Risk Score and Plan: 3 and Ondansetron, Dexamethasone and Scopolamine patch - Pre-op ? ?Airway Management Planned: LMA ? ?Additional Equipment: None ? ?Intra-op Plan:  ? ?Post-operative Plan: Extubation in OR ? ?Informed Consent: I have reviewed the patients History and Physical, chart, labs and discussed the procedure including the risks, benefits and alternatives for the proposed anesthesia with the patient or authorized representative who has indicated his/her understanding and acceptance.  ? ? ? ?Dental advisory given ? ?Plan Discussed with: CRNA ? ?Anesthesia Plan Comments:   ? ? ? ? ? ? ?Anesthesia Quick Evaluation ? ?

## 2021-05-01 NOTE — Transfer of Care (Signed)
Immediate Anesthesia Transfer of Care Note ? ?Patient: ANNALIA METZGER ? ?Procedure(s) Performed: LEFT KNEE DEBRIDEMENT (Left) ? ?Patient Location: PACU ? ?Anesthesia Type:General ? ?Level of Consciousness: awake and drowsy ? ?Airway & Oxygen Therapy: Patient Spontanous Breathing and Patient connected to face mask oxygen ? ?Post-op Assessment: Report given to RN and Post -op Vital signs reviewed and stable ? ?Post vital signs: Reviewed and stable ? ?Last Vitals:  ?Vitals Value Taken Time  ?BP 118/75 05/01/21 1602  ?Temp    ?Pulse 82 05/01/21 1605  ?Resp 15 05/01/21 1605  ?SpO2 100 % 05/01/21 1605  ?Vitals shown include unvalidated device data. ? ?Last Pain:  ?Vitals:  ? 05/01/21 1406  ?TempSrc: Oral  ?PainSc:   ?   ? ?Patients Stated Pain Goal: 0 (05/01/21 0021) ? ?Complications: No notable events documented. ?

## 2021-05-01 NOTE — Progress Notes (Signed)
?PROGRESS NOTE ? ?[name]  ?DOB: 18-Jan-1966  ?PCP: Mackie Pai, PA-C ?MBW:[id number]  ?DOA: 04/22/2021 ? LOS: 9 days  ?Hospital Day: 10 ? ?Brief narrative: ?[name] is a 56 y.o. female with PMH significant for DM2, HTN, HLD, CAD, cardiomyopathy, CVA, CKD, diabetic gastroparesis, diabetic neuropathy, GERD, depression ?Patient presented to the ED on 3/13 with complaint of left leg cellulitis not responding to a course of oral doxycycline as an outpatient. ?It started as a small swelling and blister that noted on 3/9.  It enlarged and the blister ruptured on 3/11. ? ?Patient was admitted for sepsis secondary to left leg cellulitis and abscess. ?Seen by Dr. Sharol Given. ?3/15, underwent left leg excisional debridement and wound VAC placement. ?Over the next few days, patient developed a new ulcer more proximally. ?Patient is planned for surgical debridement of left lateral knee today. ? ?Subjective: ?Patient was seen and examined this morning.  ?Frustrated with the prolonged course of hospitalization. ?Left leg incision site with serous discharge and redness.  Left knee with a new localized swelling on the lateral aspect ? ?Principal Problem: ?  Cellulitis of left leg ?Active Problems: ?  Sepsis due to cellulitis [facility]) ?  Severe sepsis ([place]) ?  Chest pain, unspecified ?  Acute kidney injury superimposed on chronic kidney disease ([place]) ?  Uncontrolled type 2 diabetes mellitus with hyperglycemia, without long-term current use of insulin ([place]) ?  Dyslipidemia ?  Abscess of left lower leg ?  Necrotizing fasciitis ([place]) ?  ?Assessment and Plan: ?Sepsis -POA ?Left leg cellulitis and abscess ?Necrotizing fasciitis ?-Presented with worsening cellulitis of left leg with central pus collection. ?-3/15, underwent left leg excisional debridement and wound VAC placement by Dr. Sharol Given.   ?-WBC count improved but cellulitis continue to worsen. ?-Surgical debridement of the wound planned again today. ?-Currently on IV  vancomycin.  ID following. ?-Continue pain management. ?Recent Labs  ?Lab 04/26/21 ?0232 04/27/21 ?0177 04/28/21 ?9390 04/29/21 ?3009 05/01/21 ?2330  ?WBC 13.4* 10.9* 10.9* 10.5 9.0  ?LATICACIDVEN  --  1.1  --   --   --   ? ? ?AKI on CKD2 ?-Creatinine elevated on admission probably because of sepsis. Improved with IV fluid. Continue to monitor. ?Recent Labs  ?  04/19/21 ?1611 04/22/21 ?1244 04/23/21 ?0240 04/24/21 ?0208 04/25/21 ?0200 04/26/21 ?0232 04/27/21 ?0762 04/28/21 ?2633 04/29/21 ?3545 05/01/21 ?6256  ?BUN 21* 23* 14 12 25* '17 13 13 14 11  '$ ?CREATININE 1.54* 1.59* 1.15* 1.21* 1.08* 1.21* 1.08* 0.97 1.04* 0.85  ? ?Uncontrolled type 2 diabetes mellitus ?Hyperglycemia and hypoglycemia ?Diabetic neuropathy ?-Patient had a significant worsening of A1c from 7.9 on July 2022 to 12.3 on 04/13/2021. ?-Home meds include Tresiba 40 units daily, metformin. ?-Patient currently on Semglee 20 units daily.  This morning her blood sugar level was less than 100 and she was n.p.o. for the procedure.  Semglee was held.  Continue to monitor blood glucose level. ?-Stop metformin for now. ?-Continue gabapentin. ?Recent Labs  ?Lab 04/30/21 ?1641 04/30/21 ?2023 05/01/21 ?0723 05/01/21 ?1102 05/01/21 ?1354  ?GLUCAP 210* 148* 84 79 82  ? ?Chronic diastolic CHF ?Essential hypertension ?-3/14 echocardiogram with EF 60 to 65%, no wall motion abnormality.   ?-Home meds include carvedilol 25 mg twice daily, amlodipine 10 mg daily Aldactone 25 mg daily, ?-Continue Coreg and amlodipine.  Aldactone remains on hold. ?-Not on diuretics. ? ?History of CVA  ?Dyslipidemia ?-Continue aspirin and statin  ? ?Goals of care ?  Code Status: Full Code  ? ? ?  Mobility: Encourage ambulation ? ?Nutritional status:  ?Body mass index is 22.92 kg/m?.  ?  ?  ? ? ? ? ?Diet:  ?Diet Order   ? ?       ?  Diet NPO time specified  Diet effective ____       ?  ?  Diet NPO time specified  Diet effective now       ?  ? ?  ?  ? ?  ? ? ?DVT prophylaxis:  ?SCDs Start: 04/24/21  1810 ?enoxaparin (LOVENOX) injection 40 mg Start: 04/22/21 2230 ?  ?Antimicrobials: IV cefepime and IV vancomycin ?Fluid: None ?Consultants: Orthopedics ?Family Communication: None at bedside ? ?Status is: Inpatient ? ?Continue in-hospital care because: ongoing management of sepsis and wound ?Level of care: Med-Surg  ? ?Dispo: The patient is from: Home ?             Anticipated d/c is to: Hopefully home ?             Patient currently is not medically stable to d/c. ?  Difficult to place patient No ? ? ? ? ?Infusions:  ? [START ON 05/02/2021]  ceFAZolin (ANCEF) IV    ? [MAR Hold] methocarbamol (ROBAXIN) IV    ? [MAR Hold] vancomycin 1,500 mg (05/01/21 1325)  ? ? ?Scheduled Meds: ? [MAR Hold] amLODipine  10 mg Oral Daily  ? [MAR Hold] aspirin EC  81 mg Oral Daily  ? [MAR Hold] atorvastatin  80 mg Oral Daily  ? [MAR Hold] carvedilol  25 mg Oral BID WC  ? chlorhexidine  60 mL Topical Once  ? [MAR Hold] cholecalciferol  400 Units Oral Daily  ? [MAR Hold] docusate sodium  100 mg Oral BID  ? [MAR Hold] enoxaparin (LOVENOX) injection  40 mg Subcutaneous Q24H  ? [MAR Hold] famotidine  20 mg Oral Daily  ? [MAR Hold] fluticasone  2 spray Each Nare Daily  ? [MAR Hold] fluticasone furoate-vilanterol  1 puff Inhalation Daily  ? [MAR Hold] gabapentin  600 mg Oral TID  ? [MAR Hold] insulin glargine-yfgn  20 Units Subcutaneous Daily  ? [MAR Hold] insulin regular  0-9 Units Subcutaneous TID AC  ? povidone-iodine  2 application. Topical Once  ? [MAR Hold] sertraline  100 mg Oral Daily  ? [MAR Hold] sucralfate  1 g Oral TID WC & HS  ? [MAR Hold] topiramate  50 mg Oral BID  ? ? ?PRN meds: ?[MAR Hold] acetaminophen, [MAR Hold] albuterol, [MAR Hold] bisacodyl, [MAR Hold] busPIRone, [MAR Hold]  HYDROmorphone (DILAUDID) injection, [MAR Hold] magnesium hydroxide, [MAR Hold] methocarbamol **OR** [MAR Hold] methocarbamol (ROBAXIN) IV, [MAR Hold] metoCLOPramide **OR** [MAR Hold] metoCLOPramide (REGLAN) injection, [MAR Hold] nitroGLYCERIN, [MAR  Hold] oxyCODONE-acetaminophen, [MAR Hold] polyethylene glycol, [MAR Hold] SUMAtriptan, [MAR Hold] traZODone  ? ?Antimicrobials: ?Anti-infectives (From admission, onward)  ? ? Start     Dose/Rate Route Frequency Ordered Stop  ? 05/02/21 0600  ceFAZolin (ANCEF) IVPB 2g/100 mL premix       ? 2 g ?200 mL/hr over 30 Minutes Intravenous On call to O.R. 05/01/21 1358 05/03/21 0559  ? 04/24/21 1200  [MAR Hold]  vancomycin (VANCOREADY) IVPB 1500 mg/300 mL        (MAR Hold since Wed 05/01/2021 at 1405.Hold Reason: Transfer to a Procedural area)  ? 1,500 mg ?150 mL/hr over 120 Minutes Intravenous Every 24 hours 04/23/21 1455    ? 04/24/21 0600  ceFAZolin (ANCEF) IVPB 2g/100 mL premix       ?  2 g ?200 mL/hr over 30 Minutes Intravenous On call to O.R. 04/23/21 1928 04/24/21 1650  ? 04/23/21 1200  vancomycin (VANCOREADY) IVPB 1250 mg/250 mL  Status:  Discontinued       ? 1,250 mg ?166.7 mL/hr over 90 Minutes Intravenous Every 24 hours 04/22/21 1342 04/23/21 1455  ? 04/23/21 1000  ceFEPIme (MAXIPIME) 2 g in sodium chloride 0.9 % 100 mL IVPB  Status:  Discontinued       ? 2 g ?200 mL/hr over 30 Minutes Intravenous Every 12 hours 04/22/21 2149 04/28/21 1711  ? 04/22/21 2245  metroNIDAZOLE (FLAGYL) tablet 500 mg  Status:  Discontinued       ? 500 mg Oral Every 12 hours 04/22/21 2155 04/22/21 2257  ? 04/22/21 2230  ceFEPIme (MAXIPIME) 2 g in sodium chloride 0.9 % 100 mL IVPB       ? 2 g ?200 mL/hr over 30 Minutes Intravenous  Once 04/22/21 2135 04/22/21 2314  ? 04/22/21 2230  metroNIDAZOLE (FLAGYL) IVPB 500 mg  Status:  Discontinued       ? 500 mg ?100 mL/hr over 60 Minutes Intravenous Every 12 hours 04/22/21 2135 04/22/21 2155  ? 04/22/21 2230  vancomycin (VANCOCIN) IVPB 1000 mg/200 mL premix  Status:  Discontinued       ? 1,000 mg ?200 mL/hr over 60 Minutes Intravenous  Once 04/22/21 2135 04/22/21 2155  ? 04/22/21 1345  vancomycin (VANCOCIN) IVPB 1000 mg/200 mL premix       ? 1,000 mg ?200 mL/hr over 60 Minutes Intravenous  Once  04/22/21 1338 04/22/21 1506  ? ?  ? ? ?Objective: ?Vitals:  ? 05/01/21 1316 05/01/21 1406  ?BP: (!) 120/94 110/70  ?Pulse: 83 77  ?Resp: 17 17  ?Temp: 98.3 ?F (36.8 ?C) 98.1 ?F (36.7 ?C)  ?SpO2: 98% 97%

## 2021-05-01 NOTE — Op Note (Signed)
05/01/2021 ? ?3:53 PM ? ?PATIENT:  [name]   ? ?PRE-OPERATIVE DIAGNOSIS:  Abscess Left Knee ? ?POST-OPERATIVE DIAGNOSIS:  Same ? ?PROCEDURE:  LEFT KNEE EXCISIONAL DEBRIDEMENT ? ?Patient developed a new area of necrotizing fasciitis in a new location separate from her initial debridement.  Patient underwent surgery during the postoperative.  For a separate identifiable problem by the same physician. ? ?SURGEON:  [name], MD ? ?PHYSICIAN ASSISTANT:None ?ANESTHESIA:   General ? ?PREOPERATIVE INDICATIONS:  [name] is a  56 y.o. female with a diagnosis of Abscess Left Knee who failed conservative measures and elected for surgical management.   ? ?The risks benefits and alternatives were discussed with the patient preoperatively including but not limited to the risks of infection, bleeding, nerve injury, cardiopulmonary complications, the need for revision surgery, among others, and the patient was willing to proceed. ? ?OPERATIVE IMPLANTS: None ? ?'@ENCIMAGES'$ @ ? ?OPERATIVE FINDINGS: Patient had necrotic tissue and necrotic fascia and this was sent for cultures. ? ?OPERATIVE PROCEDURE: Patient brought the operating room underwent general anesthetic.  After adequate levels anesthesia were obtained patient's left lower extremity was prepped using DuraPrep draped into a sterile field a timeout was called.  Elliptical incision was made around the ulcerative area lateral to the lateral femoral condyle left knee.  This was carried down to the fascia which was necrotic.  The fascia was excised.  The muscle was healthy and viable there is no purulence there is no gas in the soft tissue.  After debridement of the nonviable tissue the wound was irrigated with normal saline incision closed using 2-0 nylon a sterile dressing was applied patient was extubated taken the PACU in stable condition ? ? ?DISCHARGE PLANNING: ? ?Antibiotic duration: Continue IV antibiotics.  Cultures pending ? ?Weightbearing: Weight  bearing as tolerated ? ?Pain medication: Opioid pathway ? ?Dressing care/ Wound VAC: Dry dressing ? ?Ambulatory devices: Weightbearing as tolerated ? ?Discharge to: Anticipate discharge to home ? ?Follow-up: In the office 1 week post operative. ? ? ? ? ? ? ?  ?

## 2021-05-01 NOTE — Progress Notes (Signed)
PT Cancellation Note ? ?Patient Details ?Name: [name] ?MRN: [id number] ?DOB: May 26, 1965 ? ? ?Cancelled Treatment:    Reason Eval/Treat Not Completed: Patient at procedure or test/unavailable (to OR). Will follow-up for PT treatment as schedule permits. ? ?[name], PT, DPT ?Acute Rehabilitation Services  ?Pager [phone] ?Office [phone]  ? ?[name] ?05/01/2021, 2:06 PM ? ? ?

## 2021-05-01 NOTE — Progress Notes (Signed)
Mobility Specialist: Progress Note ? ? 05/01/21 1251  ?Mobility  ?Activity Ambulated with assistance in hallway  ?Level of Assistance Standby assist, set-up cues, supervision of patient - no hands on  ?Assistive Device Front wheel walker  ?LLE Weight Bearing WBAT  ?Distance Ambulated (ft) 240 ft  ?Activity Response Tolerated well  ?$Mobility charge 1 Mobility  ? ?Received pt in bed having no complaints and agreeable to mobility. Asymptomatic throughout ambulation. To BR and then returned back to bed w/ call bell in reach and all needs met. ? ?[name] ?Mobility Specialist ?Mobility Specialist [place]: [phone] ?Mobility Specialist [place]: [phone] ? ?

## 2021-05-01 NOTE — Anesthesia Procedure Notes (Signed)
Procedure Name: LMA Insertion ?Date/Time: 05/01/2021 3:29 PM ?Performed by: Inda Coke, CRNA ?Pre-anesthesia Checklist: Patient identified, Emergency Drugs available, Suction available, Timeout performed and Patient being monitored ?Patient Re-evaluated:Patient Re-evaluated prior to induction ?Oxygen Delivery Method: Circle system utilized ?Preoxygenation: Pre-oxygenation with 100% oxygen ?Induction Type: IV induction ?Ventilation: Mask ventilation without difficulty ?LMA: LMA inserted ?LMA Size: 4.0 ?Tube type: Oral ?Placement Confirmation: positive ETCO2, CO2 detector and breath sounds checked- equal and bilateral ?Tube secured with: Tape ?Dental Injury: Teeth and Oropharynx as per pre-operative assessment  ? ? ? ? ?

## 2021-05-02 ENCOUNTER — Encounter (HOSPITAL_COMMUNITY): Payer: Self-pay | Admitting: Orthopedic Surgery

## 2021-05-02 LAB — GLUCOSE, CAPILLARY
Glucose-Capillary: 199 mg/dL — ABNORMAL HIGH (ref 70–99)
Glucose-Capillary: 205 mg/dL — ABNORMAL HIGH (ref 70–99)
Glucose-Capillary: 183 mg/dL — ABNORMAL HIGH (ref 70–99)
Glucose-Capillary: 278 mg/dL — ABNORMAL HIGH (ref 70–99)
Glucose-Capillary: 262 mg/dL — ABNORMAL HIGH (ref 70–99)
Glucose-Capillary: 207 mg/dL — ABNORMAL HIGH (ref 70–99)
Glucose-Capillary: 258 mg/dL — ABNORMAL HIGH (ref 70–99)
Glucose-Capillary: 276 mg/dL — ABNORMAL HIGH (ref 70–99)

## 2021-05-02 LAB — CBC WITH DIFFERENTIAL/PLATELET
Abs Immature Granulocytes: 0.02 10*3/uL (ref 0.00–0.07)
Basophils Absolute: 0 10*3/uL (ref 0.0–0.1)
Basophils Relative: 0 %
Eosinophils Absolute: 0 10*3/uL (ref 0.0–0.5)
Eosinophils Relative: 0 %
HCT: 32.4 % — ABNORMAL LOW (ref 36.0–46.0)
Hemoglobin: 10.5 g/dL — ABNORMAL LOW (ref 12.0–15.0)
Immature Granulocytes: 0 %
Lymphocytes Relative: 12 %
Lymphs Abs: 1 10*3/uL (ref 0.7–4.0)
MCH: 29.3 pg (ref 26.0–34.0)
MCHC: 32.4 g/dL (ref 30.0–36.0)
MCV: 90.5 fL (ref 80.0–100.0)
Monocytes Absolute: 0.1 10*3/uL (ref 0.1–1.0)
Monocytes Relative: 2 %
Neutro Abs: 6.9 10*3/uL (ref 1.7–7.7)
Neutrophils Relative %: 86 %
Platelets: 535 10*3/uL — ABNORMAL HIGH (ref 150–400)
RBC: 3.58 MIL/uL — ABNORMAL LOW (ref 3.87–5.11)
RDW: 14.4 % (ref 11.5–15.5)
WBC: 8.1 10*3/uL (ref 4.0–10.5)
nRBC: 0 % (ref 0.0–0.2)

## 2021-05-02 LAB — BASIC METABOLIC PANEL
Anion gap: 5 (ref 5–15)
BUN: 14 mg/dL (ref 6–20)
CO2: 24 mmol/L (ref 22–32)
Calcium: 8.8 mg/dL — ABNORMAL LOW (ref 8.9–10.3)
Chloride: 106 mmol/L (ref 98–111)
Creatinine, Ser: 0.96 mg/dL (ref 0.44–1.00)
GFR, Estimated: >60 mL/min (ref >60)
Glucose, Bld: 366 mg/dL — ABNORMAL HIGH (ref 70–99)
Potassium: 4.2 mmol/L (ref 3.5–5.1)
Sodium: 135 mmol/L (ref 135–145)

## 2021-05-02 MED ORDER — DOXYCYCLINE HYCLATE 100 MG PO TABS
100.0000 mg | ORAL_TABLET | Freq: Two times a day (BID) | ORAL | Status: DC
  Administered 2021-05-02 – 2021-05-03 (×3): 100 mg via ORAL
  Filled 2021-05-02 (×3): qty 1

## 2021-05-02 MED ORDER — OXYCODONE HCL 5 MG PO TABS
5.0000 mg | ORAL_TABLET | Freq: Four times a day (QID) | ORAL | Status: DC | PRN
  Administered 2021-05-02 (×2): 5 mg via ORAL
  Filled 2021-05-02 (×2): qty 1

## 2021-05-02 MED ORDER — HYDROMORPHONE HCL 1 MG/ML IJ SOLN
0.5000 mg | INTRAMUSCULAR | Status: DC | PRN
  Administered 2021-05-02 – 2021-05-03 (×4): 0.5 mg via INTRAVENOUS
  Filled 2021-05-02 (×5): qty 0.5

## 2021-05-02 MED ORDER — CEFADROXIL 500 MG PO CAPS
500.0000 mg | ORAL_CAPSULE | Freq: Two times a day (BID) | ORAL | Status: DC
  Administered 2021-05-02 – 2021-05-03 (×3): 500 mg via ORAL
  Filled 2021-05-02 (×5): qty 1

## 2021-05-02 MED ORDER — PROMETHAZINE HCL 25 MG PO TABS
12.5000 mg | ORAL_TABLET | Freq: Four times a day (QID) | ORAL | Status: DC | PRN
  Administered 2021-05-02: 12.5 mg via ORAL
  Filled 2021-05-02: qty 1

## 2021-05-02 MED ORDER — SENNOSIDES-DOCUSATE SODIUM 8.6-50 MG PO TABS
1.0000 | ORAL_TABLET | Freq: Two times a day (BID) | ORAL | Status: DC
  Administered 2021-05-02 – 2021-05-03 (×3): 1 via ORAL
  Filled 2021-05-02 (×3): qty 1

## 2021-05-02 MED ORDER — INSULIN ASPART 100 UNIT/ML IJ SOLN: 10.0000 [IU] | Freq: Once | INTRAMUSCULAR | Status: DC

## 2021-05-02 MED ORDER — INSULIN REGULAR HUMAN 100 UNIT/ML IJ SOLN
10.0000 [IU] | Freq: Once | INTRAMUSCULAR | Status: AC
  Administered 2021-05-02: 10 [IU] via SUBCUTANEOUS

## 2021-05-02 NOTE — Progress Notes (Signed)
?   ? ? ? ? ?[facility] for Infectious Disease ? ?Date of Admission:  04/22/2021    ? ?Abx: ?3/23-c doxy/cefadroxyl ? ?3/13-19 cefepime ?3/13-23 vancomycin ?                                                             ?  ?  ?Assessment: ?56 yo female dm2 admitted on 3/13 for progressive ssi lle ?  ?She is s/p I&D by dr Sharol Given on 3/15; cx gpc in clusters -- final cx pending ?3/13 bcx ngtd ?  ?3/23 assessment ?Patient is s/p debridement on 3/22 of the new lle fluctuant lesion -- no purulence seen but only necrotic fascia -- muscle viable; primary closure ? ?Cultures from 3/15 continue to be negative despite gram stain gpc clusters ? ?Given source control at this time can finish another 5 days of oral antibiotics and close ID clinic f/u ?  ?Plan: ?Stop vanc ?Start doxycycline/cefadroxil for another 5 days until 3/28 ?I can see her in ID clinic on 3/29 @ 4pm ?Discussed with primary team ? ? ? ? ?Principal Problem: ?  Cellulitis of left leg ?Active Problems: ?  Dyslipidemia ?  Sepsis due to cellulitis [facility]) ?  Severe sepsis ([place]) ?  Acute kidney injury superimposed on chronic kidney disease ([place]) ?  Chest pain, unspecified ?  Uncontrolled type 2 diabetes mellitus with hyperglycemia, without long-term current use of insulin ([place]) ?  Abscess of left lower leg ?  Necrotizing fasciitis ([place]) ? ? ?Allergies  ?Allergen Reactions  ? Insulin Aspart Other (See Comments) and Swelling  ?  adverse reaction to Novolog ?  ? Latex Itching and Rash  ? Morphine Itching and Rash  ? ? ?Scheduled Meds: ? amLODipine  10 mg Oral Daily  ? aspirin EC  81 mg Oral Daily  ? atorvastatin  80 mg Oral Daily  ? carvedilol  25 mg Oral BID WC  ? cefadroxil  500 mg Oral BID  ? cholecalciferol  400 Units Oral Daily  ? doxycycline  100 mg Oral Q12H  ? enoxaparin (LOVENOX) injection  40 mg Subcutaneous Q24H  ? famotidine  20 mg Oral Daily  ? fluticasone  2 spray Each Nare Daily  ? fluticasone furoate-vilanterol  1 puff Inhalation Daily  ? gabapentin  600  mg Oral TID  ? insulin glargine-yfgn  20 Units Subcutaneous Daily  ? insulin regular  0-9 Units Subcutaneous TID AC  ? senna-docusate  1 tablet Oral BID  ? sertraline  100 mg Oral Daily  ? sucralfate  1 g Oral TID WC & HS  ? topiramate  50 mg Oral BID  ? ?Continuous Infusions: ? sodium chloride 10 mL/hr at 05/01/21 1947  ? methocarbamol (ROBAXIN) IV    ? ?PRN Meds:.acetaminophen, albuterol, bisacodyl, busPIRone, HYDROmorphone (DILAUDID) injection, magnesium hydroxide, methocarbamol **OR** methocarbamol (ROBAXIN) IV, nitroGLYCERIN, ondansetron **OR** ondansetron (ZOFRAN) IV, oxyCODONE, polyethylene glycol, polyethylene glycol, promethazine, SUMAtriptan, traZODone ? ? ?SUBJECTIVE: ?Some sore around debridement site ?No f/c ?No n/v/diarrhea ? ?3/22 wound cx no organism seen; cx in process ? ?Review of Systems: ?ROS ?All other ROS was negative, except mentioned above ? ? ? ? ?OBJECTIVE: ?Vitals:  ? 05/01/21 2300 05/02/21 0605 05/02/21 1048 05/02/21 1248  ?BP: (!) 122/99 1'19/79 99/72 97/70 '$  ?Pulse: 76 81    ?  Resp: '17 18 13 18  '$ ?Temp: 97.9 ?F (36.6 ?C) (!) 97.5 ?F (36.4 ?C) 97.6 ?F (36.4 ?C) 98.4 ?F (36.9 ?C)  ?TempSrc: Oral Oral Oral   ?SpO2: 96% 94%    ?Weight:      ?Height:      ? ?Body mass index is 22.92 kg/m?. ? ?Physical Exam ?General/constitutional: sitting in chair, verbal, no distress ?Heent: normocephalic ?Pulm: normal respiratory effort ?Abd: s/nt ?Ext: no edema ?Neuro: nonfocal ?MSK: left LE in dressing that is clean/dry; no wound vac ? ? ? ?Lab Results ?Lab Results  ?Component Value Date  ? WBC 8.1 05/02/2021  ? HGB 10.5 (L) 05/02/2021  ? HCT 32.4 (L) 05/02/2021  ? MCV 90.5 05/02/2021  ? PLT 535 (H) 05/02/2021  ?  ?Lab Results  ?Component Value Date  ? CREATININE 0.96 05/02/2021  ? BUN 14 05/02/2021  ? NA 135 05/02/2021  ? K 4.2 05/02/2021  ? CL 106 05/02/2021  ? CO2 24 05/02/2021  ?  ?Lab Results  ?Component Value Date  ? ALT 35 04/22/2021  ? AST 31 04/22/2021  ? ALKPHOS 87 04/22/2021  ? BILITOT 0.7  04/22/2021  ?  ? ? ?Microbiology: ?Recent Results (from the past 240 hour(s))  ?MRSA Next Gen by PCR, Nasal     Status: None  ? Collection Time: 04/23/21  7:27 PM  ? Specimen: Nasal Mucosa; Nasal Swab  ?Result Value Ref Range Status  ? MRSA by PCR Next Gen NOT DETECTED NOT DETECTED Final  ?  Comment: (NOTE) ?The GeneXpert MRSA Assay (FDA approved for NASAL specimens only), ?is one component of a comprehensive MRSA colonization surveillance ?program. It is not intended to diagnose MRSA infection nor to guide ?or monitor treatment for MRSA infections. ?Test performance is not FDA approved in patients less than 2 years ?old. ?Performed at [facility], [place] [address], [place], [place] ?[address] ?  ?Aerobic/Anaerobic Culture w Gram Stain (surgical/deep wound)     Status: None  ? Collection Time: 04/24/21  4:34 PM  ? Specimen: Wound; Abscess  ?Result Value Ref Range Status  ? Specimen Description TISSUE LEFT LEG  Final  ? Special Requests NONE  Final  ? Gram Stain   Final  ?  RARE WBC PRESENT,BOTH PMN AND MONONUCLEAR ?RARE GRAM POSITIVE COCCI IN CLUSTERS ?  ? Culture   Final  ?  No growth aerobically or anaerobically. ?Performed at [facility], [place] [address], [place], [place] [address] ?  ? Report Status 04/29/2021 FINAL  Final  ?Aerobic/Anaerobic Culture w Gram Stain (surgical/deep wound)     Status: None (Preliminary result)  ? Collection Time: 05/01/21  3:39 PM  ? Specimen: Wound; Tissue  ?Result Value Ref Range Status  ? Specimen Description TISSUE  Final  ? Special Requests LEFT KNEE NECROTIZING FASCITIS SPEC A  Final  ? Gram Stain   Final  ?  FEW WBC PRESENT,BOTH PMN AND MONONUCLEAR ?NO ORGANISMS SEEN ?  ? Culture   Final  ?  NO GROWTH < 24 HOURS ?Performed at [facility], [place] [address], [place], [place] [address] ?  ? Report Status PENDING  Incomplete  ? ? ? ?Serology: ? ? ?Imaging: ?If present, new imagings (plain films, ct scans, and mri) have been personally visualized and interpreted;  radiology reports have been reviewed. Decision making incorporated into the Impression / Recommendations. ? ? ?Jabier Mutton, MD ?[facility] ?[place] ?[phone] pager    ?  05/02/2021, 1:54 PM ? ?

## 2021-05-02 NOTE — Anesthesia Postprocedure Evaluation (Signed)
Anesthesia Post Note ? ?Patient: [name] ? ?Procedure(s) Performed: LEFT KNEE DEBRIDEMENT (Left) ? ?  ? ?Patient location during evaluation: PACU ?Anesthesia Type: General ?Level of consciousness: sedated and patient cooperative ?Pain management: pain level controlled ?Vital Signs Assessment: post-procedure vital signs reviewed and stable ?Respiratory status: spontaneous breathing ?Cardiovascular status: stable ?Anesthetic complications: no ? ? ?No notable events documented. ? ?Last Vitals:  ?Vitals:  ? 05/01/21 2300 05/02/21 0605  ?BP: (!) 122/99 119/79  ?Pulse: 76 81  ?Resp: 17 18  ?Temp: 36.6 ?C (!) 36.4 ?C  ?SpO2: 96% 94%  ?  ?Last Pain:  ?Vitals:  ? 05/02/21 0605  ?TempSrc: Oral  ?PainSc:   ? ? ?  ?  ?  ?  ?  ?  ? ?Nolon Nations ? ? ? ? ?

## 2021-05-02 NOTE — Progress Notes (Signed)
?   05/02/21 1048  ?Assess: MEWS Score  ?Temp 97.6 ?F (36.4 ?C)  ?BP 99/72  ?ECG Heart Rate 80  ?Resp 13  ?Level of Consciousness Alert  ?Assess: MEWS Score  ?MEWS Temp 0  ?MEWS Systolic 1  ?MEWS Pulse 0  ?MEWS RR 1  ?MEWS LOC 0  ?MEWS Score 2  ?MEWS Score Color Yellow  ?Assess: if the MEWS score is Yellow or Red  ?Were vital signs taken at a resting state? No  ?Focused Assessment Change from prior assessment (see assessment flowsheet)  ?Early Detection of Sepsis Score *See Row Information* Low  ?MEWS guidelines implemented *See Row Information* Yes  ?Treat  ?Pain Scale 0-10  ?Pain Score 0  ?Faces Pain Scale 0  ?Take Vital Signs  ?Increase Vital Sign Frequency  Yellow: Q 2hr X 2 then Q 4hr X 2, if remains yellow, continue Q 4hrs  ?Escalate  ?MEWS: Escalate Yellow: discuss with charge nurse/RN and consider discussing with provider and RRT  ?Notify: Charge Nurse/RN  ?Name of Charge Nurse/RN Notified Lauren  ?Date Charge Nurse/RN Notified 05/02/21  ?Time Charge Nurse/RN Notified 1050  ?Notify: Provider  ?Provider Name/Title [place]  ?Date Provider Notified 05/02/21  ?Time Provider Notified 1051  ?Time of Provider Response 1051  ?Document  ?Patient Outcome Stabilized after interventions  ?Progress note created (see row info) Yes  ? ?Will continue to monitor patient, charge nurse aware, patient just finished PT.  ?

## 2021-05-02 NOTE — Progress Notes (Signed)
Pt reported that she did not receive her scheduled insulin-semglee due at 10 am today, due to having surgery. Checked CBG upon request, CBG-[id number] at this time. Pt is requesting her missed scheduled insulin at this time. James Daniels-NP notified, received new order for Regular insulin 10 units. Insulin administered as ordered, recheck CBG-[id number] at 0417. Pt medicated x3 with prn pain medication for reports of left leg pain-partial effects noted.  ?

## 2021-05-02 NOTE — Progress Notes (Addendum)
Patient ID: [name], female   DOB: 1965-02-22, 56 y.o.   MRN: [id number] ?Patient is a 56 year old woman who presents status post debridement of an abscess lateral left knee.  There was necrotic fascia and necrotic tissue but no purulence.  Tissue was sent for cultures.  Tissue margins were clear with healthy muscle tissue.  Patient requested oxycodone instead of Percocet.  Order was changed. ?

## 2021-05-02 NOTE — Progress Notes (Signed)
Mobility Specialist Progress Note  ? ? 05/02/21 1428  ?Mobility  ?Activity Ambulated with assistance in room  ?Level of Assistance Standby assist, set-up cues, supervision of patient - no hands on  ?Assistive Device Front wheel walker  ?LLE Weight Bearing WBAT  ?Distance Ambulated (ft) 32 ft  ?Activity Response Tolerated well  ?$Mobility charge 1 Mobility  ? ?Pt received in chair and agreeable. C/o pain. Returned to chair with call bell in reach.  ? ?Hildred Alamin ?Mobility Specialist  ?  ?

## 2021-05-02 NOTE — Progress Notes (Addendum)
?PROGRESS NOTE ? ?[name]  ?DOB: May 23, 1965  ?PCP: Mackie Pai, PA-C ?QMV:[id number]  ?DOA: 04/22/2021 ? LOS: 10 days  ?Hospital Day: 11 ? ?Brief narrative: ?[name] is a 56 y.o. female with PMH significant for DM2, HTN, HLD, CAD, cardiomyopathy, CVA, CKD, diabetic gastroparesis, diabetic neuropathy, GERD, depression ?Patient presented to the ED on 3/13 with complaint of left leg cellulitis not responding to a course of oral doxycycline as an outpatient. ?It started as a small swelling and blister that noted on 3/9.  It enlarged and the blister ruptured on 3/11. ? ?Patient was admitted for sepsis secondary to left leg cellulitis and abscess. ?Seen by Dr. Sharol Given. ?3/15, underwent left leg excisional debridement and wound VAC placement. ?Over the next few days, patient developed a new ulcer more proximally. ?3/22, patient underwent left knee excisional debridement. ? ?Subjective: ?Patient was seen and examined this morning.  ?Pain controlled. ?Reports that her last BM was 10 days ago. ? ?Principal Problem: ?  Cellulitis of left leg ?Active Problems: ?  Sepsis due to cellulitis [facility]) ?  Severe sepsis ([place]) ?  Chest pain, unspecified ?  Acute kidney injury superimposed on chronic kidney disease ([place]) ?  Uncontrolled type 2 diabetes mellitus with hyperglycemia, without long-term current use of insulin ([place]) ?  Dyslipidemia ?  Abscess of left lower leg ?  Necrotizing fasciitis ([place]) ?  ?Assessment and Plan: ?Sepsis -POA ?Left leg cellulitis/abscess/Necrotizing fasciitis ?Left knee necrotizing fasciitis ?-Presented with worsening cellulitis of left leg with central pus collection. ?-3/15, underwent left leg excisional debridement, found to have necrotizing fasciitis, underwent wound VAC placement by Dr. Sharol Given.   ?-After few days, patient was noted to have a new area of collection in the lateral aspect of her left knee. ?-3/22, patient underwent left knee excisional debridement.  Per surgical note,  necrotic fascia was excised.  Muscle was healthy and viable without any purulence or gas in the soft tissue.   ?-ID following.  Previously on broad-spectrum antibiotics.  I noticed that ID switched her to oral cefadroxil today.   ?-Continue pain management. ?Recent Labs  ?Lab 04/27/21 ?2841 04/28/21 ?3244 04/29/21 ?0102 05/01/21 ?7253 05/02/21 ?6644  ?WBC 10.9* 10.9* 10.5 9.0 8.1  ?LATICACIDVEN 1.1  --   --   --   --   ? ?AKI on CKD2 ?-Creatinine elevated on admission probably because of sepsis. Improved with IV fluid. Continue to monitor. ?Recent Labs  ?  04/22/21 ?1244 04/23/21 ?0240 04/24/21 ?0208 04/25/21 ?0200 04/26/21 ?0232 04/27/21 ?0347 04/28/21 ?4259 04/29/21 ?5638 05/01/21 ?7564 05/02/21 ?3329  ?BUN 23* 14 12 25* '17 13 13 14 11 14  '$ ?CREATININE 1.59* 1.15* 1.21* 1.08* 1.21* 1.08* 0.97 1.04* 0.85 0.96  ? ?Uncontrolled type 2 diabetes mellitus ?Hyperglycemia and hypoglycemia ?Diabetic neuropathy ?-Patient had a significant worsening of A1c from 7.9 on July 2022 to 12.3 on 04/13/2021. ?-Home meds include Tresiba 40 units daily, metformin. ?-Patient currently on Semglee 20 units daily.  A.m. dose of Semglee was held yesterday because he was n.p.o. for the procedure.  Blood sugar level ran over 300 overnight.  Semglee given this morning.  Continue to monitor Accu-Cheks with sliding scale insulin. ?-Stop metformin for now. ?-Continue gabapentin. ?Recent Labs  ?Lab 05/01/21 ?2122 05/01/21 ?2344 05/02/21 ?5188 05/02/21 ?4166 05/02/21 ?1111  ?GLUCAP 318* 411* 278* 262* 207*  ? ?Chronic diastolic CHF ?Essential hypertension ?-3/14 echocardiogram with EF 60 to 65%, no wall motion abnormality.   ?-Home meds include carvedilol 25 mg twice daily, amlodipine  10 mg daily Aldactone 25 mg daily, ?-Continue Coreg and amlodipine.  Aldactone remains on hold. ?-Not on diuretics. ? ?History of CVA  ?Dyslipidemia ?-Continue aspirin and statin  ? ?Constipation ?-Reports bowel movement 10 days ago ?-Reminded the nurse to use as needed  Dulcolax and MiraLAX ?-Ordered scheduled Senokot as well. ? ?Goals of care ?  Code Status: Full Code  ? ? ?Mobility: Encourage ambulation ? ?Nutritional status:  ?Body mass index is 22.92 kg/m?.  ?  ?  ? ? ? ? ?Diet:  ?Diet Order   ? ?       ?  Diet regular Room service appropriate? Yes; Fluid consistency: Thin  Diet effective now       ?  ? ?  ?  ? ?  ? ? ?DVT prophylaxis:  ?SCDs Start: 05/01/21 1726 ?SCDs Start: 04/24/21 1810 ?enoxaparin (LOVENOX) injection 40 mg Start: 04/22/21 2230 ?  ?Antimicrobials: Cefadroxil oral ?Fluid: None ?Consultants: Orthopedics ?Family Communication: None at bedside ? ?Status is: Inpatient ? ?Continue in-hospital care because: ongoing management of sepsis and wound ?Level of care: Med-Surg  ? ?Dispo: The patient is from: Home ?             Anticipated d/c is to: Hopefully home ?             Patient currently is not medically stable to d/c. ?  Difficult to place patient No ? ? ? ? ?Infusions:  ? sodium chloride 10 mL/hr at 05/01/21 1947  ? methocarbamol (ROBAXIN) IV    ? ? ?Scheduled Meds: ? amLODipine  10 mg Oral Daily  ? aspirin EC  81 mg Oral Daily  ? atorvastatin  80 mg Oral Daily  ? carvedilol  25 mg Oral BID WC  ? cefadroxil  500 mg Oral BID  ? cholecalciferol  400 Units Oral Daily  ? doxycycline  100 mg Oral Q12H  ? enoxaparin (LOVENOX) injection  40 mg Subcutaneous Q24H  ? famotidine  20 mg Oral Daily  ? fluticasone  2 spray Each Nare Daily  ? fluticasone furoate-vilanterol  1 puff Inhalation Daily  ? gabapentin  600 mg Oral TID  ? insulin glargine-yfgn  20 Units Subcutaneous Daily  ? insulin regular  0-9 Units Subcutaneous TID AC  ? senna-docusate  1 tablet Oral BID  ? sertraline  100 mg Oral Daily  ? sucralfate  1 g Oral TID WC & HS  ? topiramate  50 mg Oral BID  ? ? ?PRN meds: ?acetaminophen, albuterol, bisacodyl, busPIRone, HYDROmorphone (DILAUDID) injection, magnesium hydroxide, methocarbamol **OR** methocarbamol (ROBAXIN) IV, nitroGLYCERIN, ondansetron **OR** ondansetron  (ZOFRAN) IV, oxyCODONE, polyethylene glycol, polyethylene glycol, promethazine, SUMAtriptan, traZODone  ? ?Antimicrobials: ?Anti-infectives (From admission, onward)  ? ? Start     Dose/Rate Route Frequency Ordered Stop  ? 05/02/21 1045  cefadroxil (DURICEF) capsule 500 mg       ? 500 mg Oral 2 times daily 05/02/21 0955 05/07/21 0959  ? 05/02/21 1045  doxycycline (VIBRA-TABS) tablet 100 mg       ? 100 mg Oral Every 12 hours 05/02/21 0955 05/07/21 0959  ? 05/02/21 0600  ceFAZolin (ANCEF) IVPB 2g/100 mL premix  Status:  Discontinued       ? 2 g ?200 mL/hr over 30 Minutes Intravenous On call to O.R. 05/01/21 1358 05/01/21 1700  ? 04/24/21 1200  vancomycin (VANCOREADY) IVPB 1500 mg/300 mL  Status:  Discontinued       ? 1,500 mg ?150 mL/hr over 120 Minutes Intravenous  Every 24 hours 04/23/21 1455 05/02/21 0955  ? 04/24/21 0600  ceFAZolin (ANCEF) IVPB 2g/100 mL premix       ? 2 g ?200 mL/hr over 30 Minutes Intravenous On call to O.R. 04/23/21 1928 04/24/21 1650  ? 04/23/21 1200  vancomycin (VANCOREADY) IVPB 1250 mg/250 mL  Status:  Discontinued       ? 1,250 mg ?166.7 mL/hr over 90 Minutes Intravenous Every 24 hours 04/22/21 1342 04/23/21 1455  ? 04/23/21 1000  ceFEPIme (MAXIPIME) 2 g in sodium chloride 0.9 % 100 mL IVPB  Status:  Discontinued       ? 2 g ?200 mL/hr over 30 Minutes Intravenous Every 12 hours 04/22/21 2149 04/28/21 1711  ? 04/22/21 2245  metroNIDAZOLE (FLAGYL) tablet 500 mg  Status:  Discontinued       ? 500 mg Oral Every 12 hours 04/22/21 2155 04/22/21 2257  ? 04/22/21 2230  ceFEPIme (MAXIPIME) 2 g in sodium chloride 0.9 % 100 mL IVPB       ? 2 g ?200 mL/hr over 30 Minutes Intravenous  Once 04/22/21 2135 04/22/21 2314  ? 04/22/21 2230  metroNIDAZOLE (FLAGYL) IVPB 500 mg  Status:  Discontinued       ? 500 mg ?100 mL/hr over 60 Minutes Intravenous Every 12 hours 04/22/21 2135 04/22/21 2155  ? 04/22/21 2230  vancomycin (VANCOCIN) IVPB 1000 mg/200 mL premix  Status:  Discontinued       ? 1,000 mg ?200 mL/hr  over 60 Minutes Intravenous  Once 04/22/21 2135 04/22/21 2155  ? 04/22/21 1345  vancomycin (VANCOCIN) IVPB 1000 mg/200 mL premix       ? 1,000 mg ?200 mL/hr over 60 Minutes Intravenous  Once 04/22/21 1338

## 2021-05-02 NOTE — Progress Notes (Signed)
Physical Therapy Treatment ?Patient Details ?Name: [name] ?MRN: [id number] ?DOB: 07-24-1965 ?Today's Date: 05/02/2021 ? ? ?History of Present Illness Pt is a 56 y.o. female admitted 04/22/21 with LLE cellulitis and abcess not responding to a course of oral doxycycline as an outpatient. S/p LLE debridement 3/15. Pt with new abscess on LLE 3/20; s/p L knee excisional debridement 3/22. PMH includes CAD, CKD III, DM2, HTN, HLD, cardiomyopathy, lumbar stenosis, anxiety. ?  ?PT Comments  ? ? Pt progressing with mobility, now s/p L knee debridement. Pt mobilizing mod indep in room, but with poor gait quality due to pain and limited LLE ROM; pt not incorporating educ/recommendations for improved gait mechanics. Pt agreeable to RW for initial use at home. Will continue to follow acutely to address established goals. ?   ?Recommendations for follow up therapy are one component of a multi-disciplinary discharge planning process, led by the attending physician.  Recommendations may be updated based on patient status, additional functional criteria and insurance authorization. ? ?Follow Up Recommendations ? No PT follow up ?  ?  ?Assistance Recommended at Discharge Frequent or constant Supervision/Assistance  ?Patient can return home with the following Assistance with cooking/housework;Assist for transportation;Help with stairs or ramp for entrance ?  ?Equipment Recommendations ? Rolling walker (2 wheels)  ?  ?Recommendations for Other Services   ? ? ?  ?Precautions / Restrictions Precautions ?Precautions: Fall;Other (comment) ?Precaution Comments: watch BP ?Restrictions ?Weight Bearing Restrictions: Yes ?LLE Weight Bearing: Weight bearing as tolerated  ?  ? ?Mobility ? Bed Mobility ?Overal bed mobility: Independent ?  ?  ?  ?  ?  ?  ?  ?  ? ?Transfers ?Overall transfer level: Independent ?Equipment used: None ?Transfers: Sit to/from Stand ?  ?  ?  ?  ?  ?  ?General transfer comment: able to stand from EOB, recliner and  low toilet without DME ?  ? ?Ambulation/Gait ?Ambulation/Gait assistance: Modified independent (Device/Increase time) ?Gait Distance (Feet): 24 Feet ?Assistive device: None ?Gait Pattern/deviations: Step-through pattern, Decreased stride length, Trunk flexed, Antalgic, Knee flexed in stance - left ?Gait velocity: Decreased ?  ?  ?General Gait Details: pt declining use of DME to walk to/from bathroom; able to do so mod indep for increased time, antalgic pattern, pt not incorporating cues for improved posture/gait mechanics ? ? ?Stairs ?  ?  ?  ?  ?  ? ? ?Wheelchair Mobility ?  ? ?Modified Rankin (Stroke Patients Only) ?  ? ? ?  ?Balance Overall balance assessment: Needs assistance ?Sitting-balance support: No upper extremity supported ?Sitting balance-Leahy Scale: Good ?  ?  ?Standing balance support: No upper extremity supported, During functional activity ?Standing balance-Leahy Scale: Fair ?Standing balance comment: can static stand and take steps without UE support; encouraged RW use to offload LLE ?  ?  ?  ?  ?  ?  ?  ?  ?  ?  ?  ?  ? ?  ?Cognition Arousal/Alertness: Awake/alert ?Behavior During Therapy: Flat affect ?Overall Cognitive Status: No family/caregiver present to determine baseline cognitive functioning ?  ?  ?  ?  ?  ?  ?  ?  ?  ?  ?  ?  ?  ?  ?  ?  ?General Comments: flat affect and soft speech, similar to how pt has been presenting this admission ?  ?  ? ?  ?Exercises   ? ?  ?General Comments General comments (skin integrity, edema, etc.): pt agreeable to RW for  home "I guess I should use it the first few days." BP 99/72 post-mobility ?  ?  ? ?Pertinent Vitals/Pain Pain Assessment ?Pain Assessment: Faces ?Faces Pain Scale: Hurts little more ?Pain Location: LLE ?Pain Descriptors / Indicators: Discomfort, Guarding ?Pain Intervention(s): Monitored during session  ? ? ?Home Living   ?  ?  ?  ?  ?  ?  ?  ?  ?  ?   ?  ?Prior Function    ?  ?  ?   ? ?PT Goals (current goals can now be found in the care  plan section) Progress towards PT goals: Progressing toward goals ? ?  ?Frequency ? ? ? Min 3X/week ? ? ? ?  ?PT Plan Current plan remains appropriate  ? ? ?Co-evaluation   ?  ?  ?  ?  ? ?  ?AM-PAC PT "6 Clicks" Mobility   ?Outcome Measure ? Help needed turning from your back to your side while in a flat bed without using bedrails?: None ?Help needed moving from lying on your back to sitting on the side of a flat bed without using bedrails?: None ?Help needed moving to and from a bed to a chair (including a wheelchair)?: None ?Help needed standing up from a chair using your arms (e.g., wheelchair or bedside chair)?: None ?Help needed to walk in hospital room?: None ?Help needed climbing 3-5 steps with a railing? : A Little ?6 Click Score: 23 ? ?  ?End of Session   ?Activity Tolerance: Patient tolerated treatment well;Patient limited by fatigue ?Patient left: in chair;with call bell/phone within reach;with nursing/sitter in room ?Nurse Communication: Mobility status;Other (comment) (RN ok with pt mobilizing independently) ?PT Visit Diagnosis: Unsteadiness on feet (R26.81);Other abnormalities of gait and mobility (R26.89);Pain ?Pain - Right/Left: Left ?Pain - part of body: Leg ?  ? ? ?Time: 1036-1050 ?PT Time Calculation (min) (ACUTE ONLY): 14 min ? ?Charges:  $Self Care/Home Management: 8-22          ?          ? ?[name], PT, DPT ?Acute Rehabilitation Services  ?Pager [phone] ?Office [phone] ? ?[name] ?05/02/2021, 1:35 PM ? ?

## 2021-05-03 ENCOUNTER — Other Ambulatory Visit (HOSPITAL_COMMUNITY): Payer: Self-pay

## 2021-05-03 LAB — BASIC METABOLIC PANEL
Anion gap: 9 (ref 5–15)
BUN: 21 mg/dL — ABNORMAL HIGH (ref 6–20)
CO2: 22 mmol/L (ref 22–32)
Calcium: 8.8 mg/dL — ABNORMAL LOW (ref 8.9–10.3)
Chloride: 106 mmol/L (ref 98–111)
Creatinine, Ser: 1.02 mg/dL — ABNORMAL HIGH (ref 0.44–1.00)
GFR, Estimated: >60 mL/min (ref >60)
Glucose, Bld: 302 mg/dL — ABNORMAL HIGH (ref 70–99)
Potassium: 3.9 mmol/L (ref 3.5–5.1)
Sodium: 137 mmol/L (ref 135–145)

## 2021-05-03 LAB — CBC WITH DIFFERENTIAL/PLATELET
Abs Immature Granulocytes: 0.04 10*3/uL (ref 0.00–0.07)
Basophils Absolute: 0 10*3/uL (ref 0.0–0.1)
Basophils Relative: 0 %
Eosinophils Absolute: 0.2 10*3/uL (ref 0.0–0.5)
Eosinophils Relative: 2 %
HCT: 32.2 % — ABNORMAL LOW (ref 36.0–46.0)
Hemoglobin: 10.7 g/dL — ABNORMAL LOW (ref 12.0–15.0)
Immature Granulocytes: 1 %
Lymphocytes Relative: 31 %
Lymphs Abs: 2.5 10*3/uL (ref 0.7–4.0)
MCH: 30.1 pg (ref 26.0–34.0)
MCHC: 33.2 g/dL (ref 30.0–36.0)
MCV: 90.7 fL (ref 80.0–100.0)
Monocytes Absolute: 0.7 10*3/uL (ref 0.1–1.0)
Monocytes Relative: 8 %
Neutro Abs: 4.6 10*3/uL (ref 1.7–7.7)
Neutrophils Relative %: 58 %
Platelets: 480 10*3/uL — ABNORMAL HIGH (ref 150–400)
RBC: 3.55 MIL/uL — ABNORMAL LOW (ref 3.87–5.11)
RDW: 14.5 % (ref 11.5–15.5)
WBC: 8 10*3/uL (ref 4.0–10.5)
nRBC: 0 % (ref 0.0–0.2)

## 2021-05-03 LAB — GLUCOSE, CAPILLARY
Glucose-Capillary: 303 mg/dL — ABNORMAL HIGH (ref 70–99)
Glucose-Capillary: 116 mg/dL — ABNORMAL HIGH (ref 70–99)

## 2021-05-03 MED ORDER — OXYCODONE HCL 5 MG PO TABS
5.0000 mg | ORAL_TABLET | ORAL | Status: DC | PRN
  Administered 2021-05-03: 5 mg via ORAL
  Filled 2021-05-03: qty 1

## 2021-05-03 MED ORDER — SENNOSIDES-DOCUSATE SODIUM 8.6-50 MG PO TABS
1.0000 | ORAL_TABLET | Freq: Two times a day (BID) | ORAL | 0 refills | Status: AC
  Filled 2021-05-03: qty 28, 14d supply, fill #0

## 2021-05-03 MED ORDER — CEFADROXIL 500 MG PO CAPS: 500.0000 mg | ORAL_CAPSULE | Freq: Two times a day (BID) | ORAL | 0 refills | Status: DC

## 2021-05-03 MED ORDER — CHOLECALCIFEROL 10 MCG (400 UNIT) PO TABS: 400.0000 [IU] | ORAL_TABLET | Freq: Every day | ORAL | 0 refills | Status: DC

## 2021-05-03 MED ORDER — BISACODYL 10 MG RE SUPP
10.0000 mg | Freq: Every day | RECTAL | 0 refills | Status: AC | PRN
  Filled 2021-05-03: qty 12, 12d supply, fill #0

## 2021-05-03 MED ORDER — METFORMIN HCL 500 MG PO TABS
500.0000 mg | ORAL_TABLET | Freq: Two times a day (BID) | ORAL | Status: DC
  Administered 2021-05-03 (×2): 500 mg via ORAL
  Filled 2021-05-03 (×2): qty 1

## 2021-05-03 MED ORDER — BISACODYL 10 MG RE SUPP: 10.0000 mg | Freq: Every day | RECTAL | 0 refills | Status: DC | PRN

## 2021-05-03 MED ORDER — DOXYCYCLINE HYCLATE 100 MG PO TABS: 100.0000 mg | ORAL_TABLET | Freq: Two times a day (BID) | ORAL | 0 refills | Status: DC

## 2021-05-03 MED ORDER — INSULIN GLARGINE-YFGN 100 UNIT/ML ~~LOC~~ SOLN
25.0000 [IU] | Freq: Every day | SUBCUTANEOUS | Status: DC
  Administered 2021-05-03: 25 [IU] via SUBCUTANEOUS
  Filled 2021-05-03: qty 0.25

## 2021-05-03 MED ORDER — DOXYCYCLINE HYCLATE 100 MG PO TABS
100.0000 mg | ORAL_TABLET | Freq: Two times a day (BID) | ORAL | 0 refills | Status: AC
  Filled 2021-05-03: qty 8, 4d supply, fill #0

## 2021-05-03 MED ORDER — SENNOSIDES-DOCUSATE SODIUM 8.6-50 MG PO TABS
1.0000 | ORAL_TABLET | Freq: Two times a day (BID) | ORAL | 0 refills | Status: DC
Start: 2021-05-03 — End: 2021-05-03

## 2021-05-03 MED ORDER — OXYCODONE HCL 5 MG PO TABS: 5.0000 mg | ORAL_TABLET | Freq: Four times a day (QID) | ORAL | 0 refills | Status: DC | PRN

## 2021-05-03 MED ORDER — OXYCODONE HCL 5 MG PO TABS
5.0000 mg | ORAL_TABLET | Freq: Four times a day (QID) | ORAL | 0 refills | Status: DC | PRN
  Filled 2021-05-03: qty 20, 5d supply, fill #0

## 2021-05-03 MED ORDER — CHOLECALCIFEROL 10 MCG (400 UNIT) PO TABS
400.0000 [IU] | ORAL_TABLET | Freq: Every day | ORAL | 0 refills | Status: AC
  Filled 2021-05-03 – 2021-07-23 (×2): qty 90, 90d supply, fill #0

## 2021-05-03 MED ORDER — CEFADROXIL 500 MG PO CAPS
500.0000 mg | ORAL_CAPSULE | Freq: Two times a day (BID) | ORAL | 0 refills | Status: AC
  Filled 2021-05-03: qty 8, 4d supply, fill #0

## 2021-05-03 NOTE — Progress Notes (Signed)
Mobility Specialist: Progress Note ? ? 05/03/21 1320  ?Mobility  ?Activity Ambulated with assistance in hallway  ?Level of Assistance Standby assist, set-up cues, supervision of patient - no hands on  ?Assistive Device Front wheel walker  ?LLE Weight Bearing WBAT  ?Distance Ambulated (ft) 220 ft  ?Activity Response Tolerated well  ?$Mobility charge 1 Mobility  ? ?Pt received in BR and agreeable to mobility. C/o LLE pain during ambulation, no rating given, otherwise asymptomatic. Pt to bed after session with all needs met.  ? ?[name] ?Mobility Specialist ?Mobility Specialist [place]: [phone] ?Mobility Specialist [place]: [phone] ? ?

## 2021-05-03 NOTE — Plan of Care (Signed)

## 2021-05-03 NOTE — Discharge Summary (Signed)
? ?Physician Discharge Summary  ?[name] PTW:[id number] DOB: [birth year] DOA: 04/22/2021 ? ?PCP: Mackie Pai, PA-C ? ?Admit date: 04/22/2021 ?Discharge date: 05/03/2021 ? ?Admitted From: Home ?Discharge disposition: Home ? ?Recommendations at discharge:  ?Continue oral antibiotics till 3/28 ?Follow-up with Dr. Sharol Given as an outpatient ?Improved diabetes control ? ?Brief narrative: ?[name] is a 56 y.o. female with PMH significant for DM2, HTN, HLD, CAD, cardiomyopathy, CVA, CKD, diabetic gastroparesis, diabetic neuropathy, GERD, depression ?Patient presented to the ED on 3/13 with complaint of left leg cellulitis not responding to a course of oral doxycycline as an outpatient. ?It started as a small swelling and blister that noted on 3/9.  It enlarged and the blister ruptured on 3/11. ? ?Patient was admitted for sepsis secondary to left leg cellulitis and abscess. ?Seen by Dr. Sharol Given. ?3/15, underwent left leg excisional debridement and wound VAC placement. ?Over the next few days, patient developed a new ulcer more proximally. ?3/22, patient underwent left knee excisional debridement. ? ?Subjective: ?Patient was seen and examined this morning.  ?Not in distress.  ? ?Principal Problem: ?  Cellulitis of left leg ?Active Problems: ?  Sepsis due to cellulitis [facility]) ?  Severe sepsis ([place]) ?  Chest pain, unspecified ?  Acute kidney injury superimposed on chronic kidney disease ([place]) ?  Uncontrolled type 2 diabetes mellitus with hyperglycemia, without long-term current use of insulin ([place]) ?  Dyslipidemia ?  Abscess of left lower leg ?  Necrotizing fasciitis ([place]) ?  ?Hospital course ?Sepsis -POA ?Left leg cellulitis/abscess/Necrotizing fasciitis ?Left knee necrotizing fasciitis ?-Presented with worsening cellulitis of left leg with central pus collection. ?-3/15, underwent left leg excisional debridement, found to have necrotizing fasciitis, underwent wound VAC placement by Dr. Sharol Given.   ?-After few days,  patient was noted to have a new area of collection in the lateral aspect of her left knee. ?-3/22, patient underwent left knee excisional debridement.  Per surgical note, necrotic fascia was excised.  Muscle was healthy and viable without any purulence or gas in the soft tissue.   ?-ID consult appreciated.  Previously on broad-spectrum antibiotics.  Patient has been switched to oral doxycycline and cefadroxil for 4 more days until 05/07/2021.  Patient will follow-up with ID Dr. Gale Journey in [place] [facility] on 3/29 at 4 PM. ?-Per Dr. Sharol Given.  Patient to continue dry dressing change at home and to follow-up with him in the office in 1 week. ?Recent Labs  ?Lab 04/27/21 ?7001 04/28/21 ?7494 04/29/21 ?4967 05/01/21 ?5916 05/02/21 ?3846 05/03/21 ?6599  ?WBC 10.9* 10.9* 10.5 9.0 8.1 8.0  ?LATICACIDVEN 1.1  --   --   --   --   --   ? ?AKI on CKD2 ?-Creatinine elevated on admission probably because of sepsis. Improved with IV fluid.  ?Recent Labs  ?  04/23/21 ?0240 04/24/21 ?0208 04/25/21 ?0200 04/26/21 ?0232 04/27/21 ?3570 04/28/21 ?1779 04/29/21 ?3903 05/01/21 ?0092 05/02/21 ?3300 05/03/21 ?7622  ?BUN 14 12 25* '17 13 13 14 11 14 '$ 21*  ?CREATININE 1.15* 1.21* 1.08* 1.21* 1.08* 0.97 1.04* 0.85 0.96 1.02*  ? ?Uncontrolled type 2 diabetes mellitus ?Hyperglycemia and hypoglycemia ?Diabetic neuropathy ?-Patient had a significant worsening of A1c from 7.9 on July 2022 to 12.3 on 04/13/2021. ?-Home meds include Tresiba 40 units daily, metformin. ?-While in the hospital, patient's blood sugar control is good.  She states that she has inconsistent compliance with her meds and also has an easy access to candies and Gatorade at her work.   ?-Resume the  same antidiabetic regimen post discharge.  Recommend to improve compliance. ?-Continue gabapentin for neuropathy ?Recent Labs  ?Lab 05/02/21 ?1111 05/02/21 ?1556 05/02/21 ?2110 05/03/21 ?0645 05/03/21 ?1037  ?GLUCAP 207* 258* 276* 303* 116*  ? ?Chronic diastolic CHF ?Essential hypertension ?-3/14  echocardiogram with EF 60 to 65%, no wall motion abnormality.   ?-Home meds include carvedilol 25 mg twice daily, amlodipine 10 mg daily Aldactone 25 mg daily, ?-Continue the same post discharge. ? ?History of CVA  ?Dyslipidemia ?-Continue aspirin and statin  ? ?Goals of care ?  Code Status: Full Code  ? ? ?Mobility: Encourage ambulation ? ?Nutritional status:  ?Body mass index is 22.92 kg/m?.  ?  ?  ? ? ?Wounds:  ?- ?Incision (Closed) 04/24/21 Leg Left (Active)  ?Date First Assessed/Time First Assessed: 04/24/21 1641   Location: Leg  Location Orientation: Left  ?  ?Assessments 04/24/2021  5:00 PM 05/02/2021  8:46 AM  ?Dressing Type Negative pressure wound therapy;Compression wrap Compression wrap  ?Dressing Clean, Dry, Intact Clean, Dry, Intact  ?Site / Wound Assessment Dressing in place / Unable to assess Dressing in place / Unable to assess  ?Drainage Amount None --  ?   ?No Linked orders to display  ?   ?Incision (Closed) 05/01/21 Leg Left (Active)  ?Date First Assessed/Time First Assessed: 05/01/21 1551   Location: Leg  Location Orientation: Left  ?  ?Assessments 05/01/2021  4:03 PM 05/02/2021  8:07 PM  ?Dressing Type Compression wrap Compression wrap  ?Dressing Clean, Dry, Intact Clean, Dry, Intact  ?Site / Wound Assessment Dressing in place / Unable to assess Dressing in place / Unable to assess  ?Drainage Amount -- None  ?   ?No Linked orders to display  ? ? ?Discharge Exam:  ? ?Vitals:  ? 05/02/21 2242 05/02/21 2357 05/03/21 0740 05/03/21 0757  ?BP: 100/71 98/67 118/87 122/83  ?Pulse:  80 82 80  ?Resp:  '16 15 20  '$ ?Temp:    97.8 ?F (36.6 ?C)  ?TempSrc:    Oral  ?SpO2:  94% 95% 97%  ?Weight:      ?Height:      ? ? ?Body mass index is 22.92 kg/m?.  ? ?General exam: Pleasant, middle-aged African-American female.  Pain controlled. ?Skin: No rashes, lesions or ulcers. ?HEENT: Atraumatic, normocephalic, no obvious bleeding ?Lungs: Clear to auscultation bilaterally ?CVS: Regular rate and rhythm, no murmur ?GI/Abd soft,  nontender, nondistended, bowel sound present ?CNS: Alert, awake, oriented x3 ?Psychiatry: Sad affect because of pain ?Extremities: No pedal edema, no calf tenderness on the right.  Left leg with bandage in place ? ?Follow ups:  ? ? Follow-up Information   ? ? Saguier, Percell Miller, PA-C Follow up.   ?Specialties: Internal Medicine, Family Medicine ?Contact information: ?[address] ?[address] ?[place] [address] ?[phone] ? ? ?  ?  ? ? [name], MD Follow up in 1 week(s).   ?Specialty: Orthopedic Surgery ?Contact information: ?[address] ?[place] [address] ?[phone] ? ? ?  ?  ? ? Vu, Rockey Situ, MD Follow up.   ?Specialty: Infectious Diseases ?Why: 3/29 at 4 PM ?Contact information: ?[address] ?[address] ?[place] [address] ?[phone] ? ? ?  ?  ? ?  ?  ? ?  ? ? ?Discharge Instructions:  ? ?Discharge Instructions   ? ? Call MD for:  difficulty breathing, headache or visual disturbances   Complete by: As directed ?  ? Call MD for:  extreme fatigue  Complete by: As directed ?  ? Call MD for:  hives   Complete by: As directed ?  ? Call MD for:  persistant dizziness or light-headedness   Complete by: As directed ?  ? Call MD for:  persistant nausea and vomiting   Complete by: As directed ?  ? Call MD for:  severe uncontrolled pain   Complete by: As directed ?  ? Call MD for:  temperature >100.4   Complete by: As directed ?  ? Diet Carb Modified   Complete by: As directed ?  ? Discharge instructions   Complete by: As directed ?  ? Recommendations at discharge:  ?? Continue oral antibiotics till 3/28 ?? Follow-up with Dr. Sharol Given as an outpatient ?? Improved diabetes control ? ?Discharge instructions for diabetes mellitus: ?Check blood sugar 3 times a day and bedtime at home. ?If blood sugar running above 200 or less than 70 please call your MD to adjust insulin. ?If you notice signs and symptoms of hypoglycemia (low blood sugar) like jitteriness, confusion, thirst, tremor and sweating,  please check blood sugar, drink sugary drink/biscuits/sweets to increase sugar level and call MD or return to ER. ? ? ? ?General discharge instructions: ?Follow with Primary MD Saguier, Percell Miller, PA-C in 7 days  ?P

## 2021-05-03 NOTE — Plan of Care (Signed)
  Problem: Clinical Measurements: Goal: Ability to maintain clinical measurements within normal limits will improve Outcome: Adequate for Discharge Goal: Will remain free from infection Outcome: Adequate for Discharge Goal: Diagnostic test results will improve Outcome: Adequate for Discharge Goal: Respiratory complications will improve Outcome: Adequate for Discharge Goal: Cardiovascular complication will be avoided Outcome: Adequate for Discharge   Problem: Activity: Goal: Risk for activity intolerance will decrease Outcome: Adequate for Discharge   Problem: Nutrition: Goal: Adequate nutrition will be maintained Outcome: Adequate for Discharge   Problem: Coping: Goal: Level of anxiety will decrease Outcome: Adequate for Discharge   Problem: Elimination: Goal: Will not experience complications related to bowel motility Outcome: Adequate for Discharge Goal: Will not experience complications related to urinary retention Outcome: Adequate for Discharge   Problem: Pain Managment: Goal: General experience of comfort will improve Outcome: Adequate for Discharge   Problem: Safety: Goal: Ability to remain free from injury will improve Outcome: Adequate for Discharge   Problem: Skin Integrity: Goal: Risk for impaired skin integrity will decrease Outcome: Adequate for Discharge   

## 2021-05-03 NOTE — TOC Transition Note (Signed)
Transition of Care (TOC) - CM/SW Discharge Note ? ? ?Patient Details  ?Name: [name] ?MRN: [id number] ?Date of Birth: 1965/09/12 ? ?Transition of Care [facility]) CM/SW Contact:  ?Sharin Mons, RN ?Phone Number: ?05/03/2021, 2:57 PM ? ? ?Clinical Narrative:    ?Patient will DC to: home ?Anticipated DC date: 05/03/2021 ?Family notified:yes ?Transport by: taxi ? ?Per MD patient ready for DC today. RN, patient, and patient's family made aware of DC. Pt states has family but min. support. States will take care of self once home. Pt doesn't work.Limited income. Match Letter provided to assist with Rx med cost. [facility] pharmacy will deliver Rx meds to pt prior to d/c.  ?Taxi voucher given to assist with transportation to home. ? ?Post hospital f/u noted on AVS. ? ?RNCM will sign off for now as intervention is no longer needed. Please consult [place] again if new needs arise.  ? ? ?Final next level of care: Home/Self Care ?Barriers to Discharge: No Barriers Identified ? ? ?Patient Goals and CMS Choice ?  ?  ?  ? ?Discharge Placement ?  ?           ?  ?  ?  ?  ? ?Discharge Plan and Services ?In-house Referral: Development worker, community ([facility]) ?Discharge Planning Services: CM Consult ?           ?DME Arranged: [place] rolling ?DME Agency: [employer] ?Date DME Agency Contacted: 05/03/21 ?Time DME Agency Contacted: [phone] ?Representative spoke with at DME Agency: Freda Munro ?  ?  ?  ?  ?  ? ?Social Determinants of Health (SDOH) Interventions ?  ? ? ?Readmission Risk Interventions ?   ? View : No data to display.  ?  ?  ?  ? ? ? ? ? ?

## 2021-05-06 LAB — AEROBIC/ANAEROBIC CULTURE W GRAM STAIN (SURGICAL/DEEP WOUND): Culture: NO GROWTH

## 2021-05-08 ENCOUNTER — Encounter: Payer: Self-pay | Admitting: Internal Medicine

## 2021-05-08 ENCOUNTER — Other Ambulatory Visit: Payer: Self-pay

## 2021-05-08 ENCOUNTER — Ambulatory Visit (INDEPENDENT_AMBULATORY_CARE_PROVIDER_SITE_OTHER): Payer: Self-pay | Admitting: Internal Medicine

## 2021-05-08 VITALS — BP 120/77 | HR 83 | Temp 98.6°F | Ht 72.0 in | Wt 168.0 lb

## 2021-05-08 DIAGNOSIS — L0291 Cutaneous abscess, unspecified: Secondary | ICD-10-CM

## 2021-05-08 NOTE — Progress Notes (Signed)
?  ? ? ? ? ?[place] for Infectious Disease ? ?Patient Active Problem List  ? Diagnosis Date Noted  ? Abscess of left lower leg   ? Necrotizing fasciitis ([place])   ? Cellulitis of left leg 04/22/2021  ? Sepsis due to cellulitis ([place]) 04/22/2021  ? Severe sepsis ([place]) 04/22/2021  ? Acute kidney injury superimposed on chronic kidney disease ([place]) 04/22/2021  ? Chest pain, unspecified 04/22/2021  ? Uncontrolled type 2 diabetes mellitus with hyperglycemia, without long-term current use of insulin ([place]) 04/22/2021  ? Pain of left calf 04/19/2021  ? Viral upper respiratory tract infection 11/30/2020  ? Trigeminal neuralgia   ? Cervical cancer ([place])   ? Chronic back pain   ? Coronary artery disease   ? GERD (gastroesophageal reflux disease)   ? Hyperlipidemia   ? Hypertension   ? Dyslipidemia 02/01/2019  ? Cardiac murmur 02/01/2019  ? Lumbar spinal stenosis 02/11/2018  ? Neuropathy 07/20/2017  ? Right flank pain   ? Nausea & vomiting 07/07/2017  ? Acute cystitis 07/07/2017  ? CAD (coronary artery disease) 07/07/2017  ? Metatarsalgia of both feet 03/11/2017  ? Diabetic gastroparesis associated with type 1 diabetes mellitus ([place]) 11/27/2016  ? Pure hypercholesterolemia 11/27/2016  ? Lumbar facet arthropathy 05/14/2016  ? Chronic pain syndrome 01/23/2016  ? Diabetic peripheral neuropathy ([place]) 01/23/2016  ? Anxiety 01/22/2016  ? History of cervical cancer 08/17/2015  ? Depression 08/10/2015  ? Overweight (BMI 25.0-29.9) 07/21/2014  ? Chronic kidney disease, stage III (moderate) ([place]) 05/23/2014  ? Diabetes type 2, uncontrolled 05/23/2014  ? Current use of insulin ([place]) 05/23/2014  ? Vitamin D deficiency 04/25/2013  ? Cerebrovascular disease 09/13/2012  ? Migraine 09/13/2012  ? Nonspecific abnormal findings on radiological and examination of skull and head 09/13/2012  ? Retention of urine 06/25/2011  ? ANKLE PAIN, LEFT 12/25/2009  ? DYSURIA 12/25/2009  ? VAGINITIS, CANDIDAL 12/25/2009  ? ULCER-GASTRIC 09/28/2009  ?  DIABETIC PERIPHERAL NEUROPATHY 09/21/2009  ? TOBACCO ABUSE 09/21/2009  ? URINARY TRACT INFECTION 09/21/2009  ? INSOMNIA 09/21/2009  ? TRANSAMINASES, SERUM, ELEVATED 09/21/2009  ? Elevation of level of transaminase or lactic acid dehydrogenase (LDH) 09/21/2009  ? Type 2 diabetes mellitus with stage 3 chronic kidney disease ([place]) 09/14/2009  ? Nausea with vomiting, unspecified 09/14/2009  ? ABDOMINAL PAIN-EPIGASTRIC 09/14/2009  ? Cardiomyopathy, secondary ([place]) 09/06/2009  ? Cardiomyopathy, unspecified ([place]) 09/06/2009  ? Essential hypertension 08/30/2009  ? DIABETES MELLITUS, TYPE II, UNCONTROLLED 07/30/2009  ? HLD (hyperlipidemia) 07/30/2009  ? GERD 07/30/2009  ? GASTROPARESIS 07/30/2009  ? CHEST PAIN, ATYPICAL 07/30/2009  ? POSITIVE PPD 07/30/2009  ? Type 2 diabetes mellitus with hyperglycemia ([place]) 07/30/2009  ? Gastric atony 07/30/2009  ? Gastroesophageal reflux disease 07/30/2009  ? Hyperlipidemia, unspecified 07/30/2009  ? Tuberculin test reaction 07/30/2009  ? ? ? ? ?Subjective:  ? ? Patient ID: [name], female    DOB: 1965-06-28, 56 y.o.   MRN: [id number] ? ?Cc - hospital follow up of left LE abscess ? ?HPI: ? ?[name] is a 56 y.o. female here for hospital follow up skin/soft tissue infection/abscess LLE ? ?I saw here on 3/19 after initial I&D of LLE. She underwent another I&D on 3/22 for a new abscess that developed on abx. ?Cx from initial 3/15 showe gpc clusters but no growth ? ?She improved on vancomycin and I transitioned her to doxy/cefadroxil for another 7 days from the second I&D ? ? ? ? ?Allergies  ?Allergen Reactions  ? Insulin Aspart  Other (See Comments) and Swelling  ?  adverse reaction to Novolog ?  ? Latex Itching and Rash  ? Morphine Itching and Rash  ? ? ? ? ?Outpatient Medications Prior to Visit  ?Medication Sig Dispense Refill  ? albuterol (VENTOLIN HFA) 108 (90 Base) MCG/ACT inhaler Inhale 2 puffs into the lungs every 6 (six) hours as needed for wheezing or shortness of  breath. 18 g 2  ? amLODipine (NORVASC) 10 MG tablet Take 1 tablet by mouth once daily (Patient taking differently: Take 10 mg by mouth daily.) 90 tablet 0  ? aspirin 81 MG tablet Take 81 mg by mouth daily.    ? atorvastatin (LIPITOR) 80 MG tablet Take 1 tablet by mouth once daily (Patient taking differently: Take 80 mg by mouth daily.) 60 tablet 0  ? azelastine (ASTELIN) 0.1 % nasal spray Place 2 sprays into both nostrils 2 (two) times daily. 30 mL 1  ? bisacodyl (DULCOLAX) 10 MG suppository Place 1 suppository (10 mg total) rectally daily as needed for up to 12 days for moderate constipation. 12 suppository 0  ? busPIRone (BUSPAR) 15 MG tablet Take 1 tablet (15 mg total) by mouth 2 (two) times daily. 60 tablet 3  ? carvedilol (COREG) 25 MG tablet Take 1 tablet (25 mg total) by mouth 2 (two) times daily with a meal. 180 tablet 0  ? cholecalciferol (VITAMIN D3) 10 MCG (400 UNIT) TABS tablet Take 1 tablet (400 Units total) by mouth daily. 90 tablet 0  ? famotidine (PEPCID) 20 MG tablet Take 1 tablet (20 mg total) by mouth daily. 30 tablet 0  ? fluticasone (FLONASE) 50 MCG/ACT nasal spray Place 2 sprays into both nostrils daily. 16 g 1  ? fluticasone furoate-vilanterol (BREO ELLIPTA) 100-25 MCG/INH AEPB Inhale 1 puff into the lungs daily. 100 each 2  ? gabapentin (NEURONTIN) 300 MG capsule Take 2 capsules (600 mg total) by mouth 3 (three) times daily. 270 capsule 0  ? insulin degludec (TRESIBA) 100 UNIT/ML FlexTouch Pen Inject 40 Units into the skin daily at 10 pm. (Patient taking differently: Inject 40 Units into the skin every evening.) 100 mL 2  ? metFORMIN (GLUCOPHAGE-XR) 500 MG 24 hr tablet Take 1 tablet (500 mg total) by mouth daily with breakfast. 60 tablet 2  ? methocarbamol (ROBAXIN) 750 MG tablet Take 1 tablet (750 mg total) by mouth every 8 (eight) hours as needed for muscle spasms. 30 tablet 0  ? nitroGLYCERIN (NITROSTAT) 0.4 MG SL tablet Place 1 tablet (0.4 mg total) under the tongue every 5 (five) minutes  as needed for chest pain. 30 tablet 3  ? oxyCODONE (OXY IR/ROXICODONE) 5 MG immediate release tablet Take 1 tablet (5 mg total) by mouth every 6 (six) hours as needed for up to 5 days for severe pain. 20 tablet 0  ? promethazine (PHENERGAN) 12.5 MG tablet Take 1 tablet (12.5 mg total) by mouth every 8 (eight) hours as needed. 30 tablet 0  ? rizatriptan (MAXALT-MLT) 10 MG disintegrating tablet Take 10 mg by mouth daily as needed for migraine.    ? senna-docusate (SENOKOT-S) 8.6-50 MG tablet Take 1 tablet by mouth 2 (two) times daily for 14 days. 28 tablet 0  ? sertraline (ZOLOFT) 100 MG tablet Take 1 tablet (100 mg total) by mouth daily. 30 tablet 3  ? spironolactone (ALDACTONE) 25 MG tablet Take 1 tablet by mouth once daily (Patient taking differently: Take 25 mg by mouth daily.) 30 tablet 0  ? topiramate (TOPAMAX) 50 MG  tablet Take 1 tablet (50 mg total) by mouth 2 (two) times daily. 60 tablet 12  ? ?No facility-administered medications prior to visit.  ? ? ? ?Social History  ? ?Socioeconomic History  ? Marital status: Single  ?  Spouse name: Not on file  ? Number of children: Not on file  ? Years of education: Not on file  ? Highest education level: Not on file  ?Occupational History  ? Not on file  ?Tobacco Use  ? Smoking status: Never  ? Smokeless tobacco: Never  ?Vaping Use  ? Vaping Use: Never used  ?Substance and Sexual Activity  ? Alcohol use: Yes  ?  Comment: occ  ? Drug use: No  ? Sexual activity: Not on file  ?Other Topics Concern  ? Not on file  ?Social History Narrative  ? Not on file  ? ?Social Determinants of Health  ? ?Financial Resource Strain: Not on file  ?Food Insecurity: Not on file  ?Transportation Needs: Not on file  ?Physical Activity: Not on file  ?Stress: Not on file  ?Social Connections: Not on file  ?Intimate Partner Violence: Not on file  ? ? ? ? ?Review of Systems ?   ?All other ros negative ? ?Objective:  ?  ?BP 120/77   Pulse 83   Temp 98.6 ?F (37 ?C) (Oral)   Ht 6' (1.829 m)   Wt  168 lb (76.2 kg)   SpO2 99%   BMI 22.78 kg/m?  ?Nursing note and vital signs reviewed. ? ?Physical Exam ? ?   ?General/constitutional: no distress, pleasant ?HEENT: Normocephalic, PER, Conj Clear, EOMI, [place]

## 2021-05-08 NOTE — Patient Instructions (Signed)
The infection seem to have resolved ? ? ?If new redness/focal swelling/purulence or fever/chill please call our clinic ? ? ?For now no need for any scheduled follow up ?

## 2021-05-10 ENCOUNTER — Other Ambulatory Visit: Payer: Self-pay | Admitting: Medical

## 2021-05-10 MED ORDER — GABAPENTIN 300 MG PO CAPS: 600.0000 mg | ORAL_CAPSULE | Freq: Three times a day (TID) | ORAL | 0 refills | Status: DC

## 2021-05-10 MED ORDER — METHOCARBAMOL 750 MG PO TABS: 750.0000 mg | ORAL_TABLET | Freq: Three times a day (TID) | ORAL | 0 refills | Status: DC | PRN

## 2021-05-10 MED ORDER — PROMETHAZINE HCL 12.5 MG PO TABS
12.5000 mg | ORAL_TABLET | Freq: Three times a day (TID) | ORAL | 0 refills | Status: DC | PRN
Start: 2021-05-10 — End: 2021-08-21

## 2021-05-10 NOTE — Telephone Encounter (Addendum)
Okay for refills on Promethazine and Robaxin? ? ?Rx refill sent to pt pharmacy. ? ?Mackie Pai, PA-C  ?

## 2021-05-14 ENCOUNTER — Ambulatory Visit (INDEPENDENT_AMBULATORY_CARE_PROVIDER_SITE_OTHER): Payer: Self-pay | Admitting: Family

## 2021-05-14 ENCOUNTER — Encounter: Payer: Self-pay | Admitting: Family

## 2021-05-14 ENCOUNTER — Ambulatory Visit (INDEPENDENT_AMBULATORY_CARE_PROVIDER_SITE_OTHER): Payer: 59

## 2021-05-14 ENCOUNTER — Telehealth: Payer: Self-pay | Admitting: Family

## 2021-05-14 DIAGNOSIS — M25572 Pain in left ankle and joints of left foot: Secondary | ICD-10-CM

## 2021-05-14 NOTE — Progress Notes (Signed)
? ?Office Visit Note ?  ?Patient: [name]           ?Date of Birth: [birth year]           ?MRN: [id number] ?Visit Date: 05/14/2021 ?             ?Requested by: Mackie Pai, PA-C ?[address] ?[address] ?[place],  [place] [address] ?PCP: Mackie Pai, PA-C ? ?Chief Complaint  ?Patient presents with  ? Left Knee - Routine Post Op  ?  05/01/21 left knee debridement ?04/24/21 left leg debridement  ? ? ? ? ?HPI: ?The patient is a 56 year old woman who presents today in postoperative follow-up she is status post left knee debridement on March 22 as well as a left leg debridement on March 15 ?Sutures are still in place she states she has been cleansing and doing dressing changes daily with these. ? ?She goes on to note that she feels all of her problems with her left leg may have started from a wound that she had on the bottom of her left foot she had a callused ulceration which she attempted to debride at home she states that she noticed purulent drainage weeks later she had issues with her upper leg she goes on to tell me that she has had surgical intervention of her left foot previously that some of her left foot third metatarsal is absent she is also complaining of intense ankle pain the ankle and foot pain beneath the metatarsal heads is her most worrisome issue today ? ?Assessment & Plan: ?Visit Diagnoses:  ?1. Pain in left ankle and joints of left foot   ? ? ?Plan: Concern for possible osteomyelitis of the foot we will send for MRI of her foot to further evaluate radiographs showing AVN versus osteo of the third metatarsal head ? ?Follow-Up Instructions: No follow-ups on file.  ? ?Ortho Exam ? ?Patient is alert, oriented, no adenopathy, well-dressed, normal affect, normal respiratory effort. ?On examination of the left lower extremity her to left leg incisions are well approximated with sutures these are healing well these are dry sutures harvested today without incident she does have 2 well-healed  surgical scars over her left foot dorsum she is tender to the third metatarsal head there is a 2 cm in diameter callused ulceration beneath the third metatarsal head this was debrided with a 10 blade knife back to viable tissue there is no active drainage no erythema this does not probe to bone she does have moderate ankle edema as well there is no point tenderness ? ?Imaging: ?No results found. ?No images are attached to the encounter. ? ?Labs: ?Lab Results  ?Component Value Date  ? HGBA1C 12.3 (H) 04/23/2021  ? HGBA1C 7.9 (H) 08/24/2020  ? HGBA1C 7.7 (H) 07/09/2017  ? ESRSEDRATE 41 (H) 08/24/2020  ? REPTSTATUS 05/06/2021 FINAL 05/01/2021  ? GRAMSTAIN  05/01/2021  ?  FEW WBC PRESENT,BOTH PMN AND MONONUCLEAR ?NO ORGANISMS SEEN ?  ? CULT  05/01/2021  ?  No growth aerobically or anaerobically. ?Performed at [facility], [place] [address], [place], [place] [address] ?  ? [place] 03/29/2012  ? ? ? ?Lab Results  ?Component Value Date  ? ALBUMIN 3.3 (L) 04/22/2021  ? ALBUMIN 3.9 04/03/2021  ? ALBUMIN 3.8 01/17/2021  ? ? ?Lab Results  ?Component Value Date  ? MG 1.8 07/11/2017  ? ?No results found for: VD25OH ? ?No results found for: PREALBUMIN ? ?  Latest Ref  Rng & Units 05/03/2021  ?  4:27 AM 05/02/2021  ?  2:26 AM 05/01/2021  ?  8:59 AM  ?CBC EXTENDED  ?WBC 4.0 - 10.5 K/uL 8.0   8.1   9.0    ?RBC 3.87 - 5.11 MIL/uL 3.55   3.58   3.77    ?Hemoglobin 12.0 - 15.0 g/dL 10.7   10.5   10.9    ?HCT 36.0 - 46.0 % 32.2   32.4   34.5    ?Platelets 150 - 400 K/uL 480   535   502    ?NEUT# 1.7 - 7.7 K/uL 4.6   6.9   5.8    ?Lymph# 0.7 - 4.0 K/uL 2.5   1.0   2.0    ? ? ? ?There is no height or weight on file to calculate BMI. ? ?Orders:  ?Orders Placed This Encounter  ?Procedures  ? XR Foot Complete Left  ? XR Ankle 2 Views Left  ? ?No orders of the defined types were placed in this encounter. ? ? ? Procedures: ?No procedures performed ? ?Clinical Data: ?No additional findings. ? ?ROS: ? ?All other systems  negative, except as noted in the HPI. ?Review of Systems  ?Constitutional:  Negative for chills and fever.  ?Musculoskeletal:  Positive for arthralgias and myalgias.  ?Skin:  Negative for color change and wound.  ? ?Objective: ?Vital Signs: There were no vitals taken for this visit. ? ?Specialty Comments:  ?No specialty comments available. ? ?PMFS History: ?Patient Active Problem List  ? Diagnosis Date Noted  ? Abscess of left lower leg   ? Necrotizing fasciitis ([place])   ? Cellulitis of left leg 04/22/2021  ? Sepsis due to cellulitis ([place]) 04/22/2021  ? Severe sepsis ([place]) 04/22/2021  ? Acute kidney injury superimposed on chronic kidney disease ([place]) 04/22/2021  ? Chest pain, unspecified 04/22/2021  ? Uncontrolled type 2 diabetes mellitus with hyperglycemia, without long-term current use of insulin ([place]) 04/22/2021  ? Pain of left calf 04/19/2021  ? Viral upper respiratory tract infection 11/30/2020  ? Trigeminal neuralgia   ? Cervical cancer ([place])   ? Chronic back pain   ? Coronary artery disease   ? GERD (gastroesophageal reflux disease)   ? Hyperlipidemia   ? Hypertension   ? Dyslipidemia 02/01/2019  ? Cardiac murmur 02/01/2019  ? Lumbar spinal stenosis 02/11/2018  ? Neuropathy 07/20/2017  ? Right flank pain   ? Nausea & vomiting 07/07/2017  ? Acute cystitis 07/07/2017  ? CAD (coronary artery disease) 07/07/2017  ? Metatarsalgia of both feet 03/11/2017  ? Diabetic gastroparesis associated with type 1 diabetes mellitus ([place]) 11/27/2016  ? Pure hypercholesterolemia 11/27/2016  ? Lumbar facet arthropathy 05/14/2016  ? Chronic pain syndrome 01/23/2016  ? Diabetic peripheral neuropathy ([place]) 01/23/2016  ? Anxiety 01/22/2016  ? History of cervical cancer 08/17/2015  ? Depression 08/10/2015  ? Overweight (BMI 25.0-29.9) 07/21/2014  ? Chronic kidney disease, stage III (moderate) ([place]) 05/23/2014  ? Diabetes type 2, uncontrolled 05/23/2014  ? Current use of insulin ([place]) 05/23/2014  ? Vitamin D deficiency 04/25/2013  ?  Cerebrovascular disease 09/13/2012  ? Migraine 09/13/2012  ? Nonspecific abnormal findings on radiological and examination of skull and head 09/13/2012  ? Retention of urine 06/25/2011  ? ANKLE PAIN, LEFT 12/25/2009  ? DYSURIA 12/25/2009  ? VAGINITIS, CANDIDAL 12/25/2009  ? ULCER-GASTRIC 09/28/2009  ? DIABETIC PERIPHERAL NEUROPATHY 09/21/2009  ? TOBACCO ABUSE 09/21/2009  ? URINARY TRACT INFECTION 09/21/2009  ? INSOMNIA 09/21/2009  ?  TRANSAMINASES, SERUM, ELEVATED 09/21/2009  ? Elevation of level of transaminase or lactic acid dehydrogenase (LDH) 09/21/2009  ? Type 2 diabetes mellitus with stage 3 chronic kidney disease ([place]) 09/14/2009  ? Nausea with vomiting, unspecified 09/14/2009  ? ABDOMINAL PAIN-EPIGASTRIC 09/14/2009  ? Cardiomyopathy, secondary ([place]) 09/06/2009  ? Cardiomyopathy, unspecified ([place]) 09/06/2009  ? Essential hypertension 08/30/2009  ? DIABETES MELLITUS, TYPE II, UNCONTROLLED 07/30/2009  ? HLD (hyperlipidemia) 07/30/2009  ? GERD 07/30/2009  ? GASTROPARESIS 07/30/2009  ? CHEST PAIN, ATYPICAL 07/30/2009  ? POSITIVE PPD 07/30/2009  ? Type 2 diabetes mellitus with hyperglycemia ([place]) 07/30/2009  ? Gastric atony 07/30/2009  ? Gastroesophageal reflux disease 07/30/2009  ? Hyperlipidemia, unspecified 07/30/2009  ? Tuberculin test reaction 07/30/2009  ? ?Past Medical History:  ?Diagnosis Date  ? ABDOMINAL PAIN-EPIGASTRIC 09/14/2009  ? Qualifier: Diagnosis of  By: Shane Crutch, [name]   ? Acute cystitis 07/07/2017  ? ANKLE PAIN, LEFT 12/25/2009  ? Qualifier: Diagnosis of  By: Amil Amen MD, Benjamine Mola    ? Anxiety 01/22/2016  ? CAD (coronary artery disease) 07/07/2017  ? Cardiac murmur 02/01/2019  ? Cardiomyopathy, secondary ([place]) 09/06/2009  ? Qualifier: Diagnosis of  By: Arvid Right   Formatting of this note might be different from the original. Overview:  Qualifier: Diagnosis of  By: Arvid Right  ? Cardiomyopathy, unspecified ([place]) 09/06/2009  ? Formatting of this note might  be different from the original. Overview:  Overview:  Qualifier: Diagnosis of  By: Arvid Right  Overview:  Overview:  Qualifier: Diagnosis of  By: Hyman Hopes of [phone]

## 2021-05-14 NOTE — Telephone Encounter (Signed)
Pt had an appt today and forgot to ask if she can shower her leg. Please call pt at [phone]. ?

## 2021-05-14 NOTE — Telephone Encounter (Signed)
Pt informed showering is fine. Do not soak leg yet in tub. ?

## 2021-05-15 ENCOUNTER — Telehealth: Payer: Self-pay | Admitting: *Deleted

## 2021-05-15 NOTE — Telephone Encounter (Signed)
SW pt, per Junie Panning needs to wear compression stocking. Pt says she is today and leg is very tight. Pt stated she is working now and on her feet. I advised her she needs to keep her leg elevated. Standing on it all day increases swelling and pain. I checked on her MRI order as she has a sore on bottom of left foot that possibly is infected. We changed the order to STAT in hopes pt can have this done sooner. Pt informed. ?

## 2021-05-15 NOTE — Telephone Encounter (Signed)
Pt was in office yesterday and had the stitches taken out and stated that MD told her she got possibly get an infection, pt today called saying her left leg is now 2x bigger than yesterday and has been back at work since Monday. Pt wants to know what can she do to keep it from swelling? ? ? ?CB [phone] ?

## 2021-05-18 ENCOUNTER — Other Ambulatory Visit: Payer: Self-pay

## 2021-05-21 ENCOUNTER — Emergency Department (HOSPITAL_COMMUNITY): Payer: 59

## 2021-05-21 ENCOUNTER — Encounter (HOSPITAL_COMMUNITY): Payer: Self-pay | Admitting: *Deleted

## 2021-05-21 ENCOUNTER — Other Ambulatory Visit: Payer: Self-pay

## 2021-05-21 ENCOUNTER — Observation Stay (HOSPITAL_COMMUNITY)
Admission: EM | Admit: 2021-05-21 | Discharge: 2021-05-25 | Disposition: A | Discharge status: Home / Self Care | Payer: 59 | Attending: Internal Medicine | Admitting: Internal Medicine

## 2021-05-21 DIAGNOSIS — E1122 Type 2 diabetes mellitus with diabetic chronic kidney disease: Secondary | ICD-10-CM | POA: Diagnosis not present

## 2021-05-21 DIAGNOSIS — Z7982 Long term (current) use of aspirin: Secondary | ICD-10-CM | POA: Diagnosis not present

## 2021-05-21 DIAGNOSIS — F32A Depression, unspecified: Secondary | ICD-10-CM | POA: Diagnosis present

## 2021-05-21 DIAGNOSIS — E782 Mixed hyperlipidemia: Secondary | ICD-10-CM | POA: Diagnosis present

## 2021-05-21 DIAGNOSIS — N3001 Acute cystitis with hematuria: Secondary | ICD-10-CM

## 2021-05-21 DIAGNOSIS — I10 Essential (primary) hypertension: Secondary | ICD-10-CM | POA: Diagnosis present

## 2021-05-21 DIAGNOSIS — B9689 Other specified bacterial agents as the cause of diseases classified elsewhere: Secondary | ICD-10-CM | POA: Diagnosis present

## 2021-05-21 DIAGNOSIS — A419 Sepsis, unspecified organism: Secondary | ICD-10-CM | POA: Diagnosis present

## 2021-05-21 DIAGNOSIS — M48061 Spinal stenosis, lumbar region without neurogenic claudication: Secondary | ICD-10-CM | POA: Diagnosis present

## 2021-05-21 DIAGNOSIS — E785 Hyperlipidemia, unspecified: Secondary | ICD-10-CM | POA: Insufficient documentation

## 2021-05-21 DIAGNOSIS — I2510 Atherosclerotic heart disease of native coronary artery without angina pectoris: Secondary | ICD-10-CM | POA: Diagnosis present

## 2021-05-21 DIAGNOSIS — K219 Gastro-esophageal reflux disease without esophagitis: Secondary | ICD-10-CM | POA: Diagnosis present

## 2021-05-21 DIAGNOSIS — Z90710 Acquired absence of both cervix and uterus: Secondary | ICD-10-CM

## 2021-05-21 DIAGNOSIS — I130 Hypertensive heart and chronic kidney disease with heart failure and stage 1 through stage 4 chronic kidney disease, or unspecified chronic kidney disease: Secondary | ICD-10-CM | POA: Diagnosis not present

## 2021-05-21 DIAGNOSIS — N390 Urinary tract infection, site not specified: Secondary | ICD-10-CM | POA: Insufficient documentation

## 2021-05-21 DIAGNOSIS — Z79899 Other long term (current) drug therapy: Secondary | ICD-10-CM | POA: Diagnosis not present

## 2021-05-21 DIAGNOSIS — Z9049 Acquired absence of other specified parts of digestive tract: Secondary | ICD-10-CM

## 2021-05-21 DIAGNOSIS — Z888 Allergy status to other drugs, medicaments and biological substances status: Secondary | ICD-10-CM

## 2021-05-21 DIAGNOSIS — E119 Type 2 diabetes mellitus without complications: Secondary | ICD-10-CM

## 2021-05-21 DIAGNOSIS — I160 Hypertensive urgency: Secondary | ICD-10-CM | POA: Diagnosis present

## 2021-05-21 DIAGNOSIS — E1142 Type 2 diabetes mellitus with diabetic polyneuropathy: Secondary | ICD-10-CM | POA: Insufficient documentation

## 2021-05-21 DIAGNOSIS — G4700 Insomnia, unspecified: Secondary | ICD-10-CM | POA: Diagnosis present

## 2021-05-21 DIAGNOSIS — G43909 Migraine, unspecified, not intractable, without status migrainosus: Secondary | ICD-10-CM | POA: Diagnosis not present

## 2021-05-21 DIAGNOSIS — F418 Other specified anxiety disorders: Secondary | ICD-10-CM | POA: Diagnosis not present

## 2021-05-21 DIAGNOSIS — Z794 Long term (current) use of insulin: Secondary | ICD-10-CM | POA: Diagnosis not present

## 2021-05-21 DIAGNOSIS — Z8541 Personal history of malignant neoplasm of cervix uteri: Secondary | ICD-10-CM

## 2021-05-21 DIAGNOSIS — L03116 Cellulitis of left lower limb: Secondary | ICD-10-CM | POA: Diagnosis not present

## 2021-05-21 DIAGNOSIS — M79605 Pain in left leg: Secondary | ICD-10-CM | POA: Diagnosis not present

## 2021-05-21 DIAGNOSIS — R6510 Systemic inflammatory response syndrome (SIRS) of non-infectious origin without acute organ dysfunction: Secondary | ICD-10-CM

## 2021-05-21 DIAGNOSIS — E1165 Type 2 diabetes mellitus with hyperglycemia: Secondary | ICD-10-CM | POA: Insufficient documentation

## 2021-05-21 DIAGNOSIS — I5032 Chronic diastolic (congestive) heart failure: Secondary | ICD-10-CM | POA: Insufficient documentation

## 2021-05-21 DIAGNOSIS — Z91040 Latex allergy status: Secondary | ICD-10-CM

## 2021-05-21 DIAGNOSIS — Z833 Family history of diabetes mellitus: Secondary | ICD-10-CM

## 2021-05-21 DIAGNOSIS — Z885 Allergy status to narcotic agent status: Secondary | ICD-10-CM

## 2021-05-21 DIAGNOSIS — G894 Chronic pain syndrome: Secondary | ICD-10-CM | POA: Diagnosis present

## 2021-05-21 DIAGNOSIS — E872 Acidosis, unspecified: Secondary | ICD-10-CM | POA: Diagnosis not present

## 2021-05-21 DIAGNOSIS — M7989 Other specified soft tissue disorders: Secondary | ICD-10-CM

## 2021-05-21 DIAGNOSIS — Z8673 Personal history of transient ischemic attack (TIA), and cerebral infarction without residual deficits: Secondary | ICD-10-CM

## 2021-05-21 DIAGNOSIS — F419 Anxiety disorder, unspecified: Secondary | ICD-10-CM | POA: Diagnosis present

## 2021-05-21 DIAGNOSIS — N183 Chronic kidney disease, stage 3 unspecified: Secondary | ICD-10-CM | POA: Diagnosis not present

## 2021-05-21 HISTORY — DX: Systemic inflammatory response syndrome (sirs) of non-infectious origin without acute organ dysfunction: R65.10

## 2021-05-21 HISTORY — DX: Chronic diastolic (congestive) heart failure: I50.32

## 2021-05-21 LAB — CBC WITH DIFFERENTIAL/PLATELET
Abs Immature Granulocytes: 0.04 10*3/uL (ref 0.00–0.07)
Basophils Absolute: 0 10*3/uL (ref 0.0–0.1)
Basophils Relative: 0 %
Eosinophils Absolute: 0.4 10*3/uL (ref 0.0–0.5)
Eosinophils Relative: 4 %
HCT: 35.9 % — ABNORMAL LOW (ref 36.0–46.0)
Hemoglobin: 11.6 g/dL — ABNORMAL LOW (ref 12.0–15.0)
Immature Granulocytes: 0 %
Lymphocytes Relative: 21 %
Lymphs Abs: 2.3 10*3/uL (ref 0.7–4.0)
MCH: 29.6 pg (ref 26.0–34.0)
MCHC: 32.3 g/dL (ref 30.0–36.0)
MCV: 91.6 fL (ref 80.0–100.0)
Monocytes Absolute: 0.7 10*3/uL (ref 0.1–1.0)
Monocytes Relative: 7 %
Neutro Abs: 7.4 10*3/uL (ref 1.7–7.7)
Neutrophils Relative %: 68 %
Platelets: 293 10*3/uL (ref 150–400)
RBC: 3.92 MIL/uL (ref 3.87–5.11)
RDW: 15 % (ref 11.5–15.5)
WBC: 11 10*3/uL — ABNORMAL HIGH (ref 4.0–10.5)
nRBC: 0 % (ref 0.0–0.2)

## 2021-05-21 LAB — CBG MONITORING, ED
Glucose-Capillary: 215 mg/dL — ABNORMAL HIGH (ref 70–99)
Glucose-Capillary: 252 mg/dL — ABNORMAL HIGH (ref 70–99)

## 2021-05-21 LAB — LACTIC ACID, PLASMA
Lactic Acid, Venous: 2.5 mmol/L (ref 0.5–1.9)
Lactic Acid, Venous: 1.2 mmol/L (ref 0.5–1.9)

## 2021-05-21 LAB — URINALYSIS, ROUTINE W REFLEX MICROSCOPIC
Bilirubin Urine: NEGATIVE
Glucose, UA: >=500 mg/dL — AB
Ketones, ur: NEGATIVE mg/dL
Nitrite: POSITIVE — AB
Protein, ur: 30 mg/dL — AB
Specific Gravity, Urine: 1.018 (ref 1.005–1.030)
WBC, UA: >50 WBC/hpf — ABNORMAL HIGH (ref 0–5)
pH: 5 (ref 5.0–8.0)

## 2021-05-21 LAB — COMPREHENSIVE METABOLIC PANEL
ALT: 14 U/L (ref 0–44)
AST: 16 U/L (ref 15–41)
Albumin: 3.6 g/dL (ref 3.5–5.0)
Alkaline Phosphatase: 85 U/L (ref 38–126)
Anion gap: 7 (ref 5–15)
BUN: 22 mg/dL — ABNORMAL HIGH (ref 6–20)
CO2: 26 mmol/L (ref 22–32)
Calcium: 8.7 mg/dL — ABNORMAL LOW (ref 8.9–10.3)
Chloride: 105 mmol/L (ref 98–111)
Creatinine, Ser: 1.12 mg/dL — ABNORMAL HIGH (ref 0.44–1.00)
GFR, Estimated: 58 mL/min — ABNORMAL LOW (ref >60)
Glucose, Bld: 410 mg/dL — ABNORMAL HIGH (ref 70–99)
Potassium: 3.8 mmol/L (ref 3.5–5.1)
Sodium: 138 mmol/L (ref 135–145)
Total Bilirubin: 0.5 mg/dL (ref 0.3–1.2)
Total Protein: 8.2 g/dL — ABNORMAL HIGH (ref 6.5–8.1)

## 2021-05-21 MED ORDER — ACETAMINOPHEN 325 MG PO TABS
650.0000 mg | ORAL_TABLET | Freq: Four times a day (QID) | ORAL | Status: DC | PRN
  Administered 2021-05-23 – 2021-05-24 (×2): 650 mg via ORAL
  Filled 2021-05-21 (×2): qty 2

## 2021-05-21 MED ORDER — VANCOMYCIN HCL 1750 MG/350ML IV SOLN
1750.0000 mg | Freq: Once | INTRAVENOUS | Status: AC
  Administered 2021-05-21: 1750 mg via INTRAVENOUS
  Filled 2021-05-21: qty 350

## 2021-05-21 MED ORDER — SODIUM CHLORIDE 0.9 % IV SOLN
2.0000 g | Freq: Three times a day (TID) | INTRAVENOUS | Status: DC
  Administered 2021-05-21 – 2021-05-25 (×12): 2 g via INTRAVENOUS
  Filled 2021-05-21 (×13): qty 12.5

## 2021-05-21 MED ORDER — ENOXAPARIN SODIUM 40 MG/0.4ML IJ SOSY
40.0000 mg | PREFILLED_SYRINGE | INTRAMUSCULAR | Status: DC
  Administered 2021-05-22 – 2021-05-24 (×3): 40 mg via SUBCUTANEOUS
  Filled 2021-05-21 (×4): qty 0.4

## 2021-05-21 MED ORDER — VANCOMYCIN HCL 1500 MG/300ML IV SOLN
1500.0000 mg | INTRAVENOUS | Status: DC
  Filled 2021-05-21: qty 300

## 2021-05-21 MED ORDER — INSULIN ASPART 100 UNIT/ML IJ SOLN
0.0000 [IU] | Freq: Three times a day (TID) | INTRAMUSCULAR | Status: DC
  Administered 2021-05-21 – 2021-05-22 (×2): 5 [IU] via SUBCUTANEOUS
  Administered 2021-05-22: 11 [IU] via SUBCUTANEOUS
  Administered 2021-05-22: 3 [IU] via SUBCUTANEOUS
  Administered 2021-05-23: 2 [IU] via SUBCUTANEOUS
  Administered 2021-05-23: 5 [IU] via SUBCUTANEOUS
  Administered 2021-05-24: 11 [IU] via SUBCUTANEOUS
  Administered 2021-05-24: 5 [IU] via SUBCUTANEOUS
  Administered 2021-05-25: 3 [IU] via SUBCUTANEOUS
  Administered 2021-05-25: 2 [IU] via SUBCUTANEOUS
  Filled 2021-05-21: qty 0.15

## 2021-05-21 MED ORDER — PROMETHAZINE HCL 25 MG PO TABS
12.5000 mg | ORAL_TABLET | Freq: Once | ORAL | Status: AC
  Administered 2021-05-21: 12.5 mg via ORAL
  Filled 2021-05-21: qty 1

## 2021-05-21 MED ORDER — OXYCODONE HCL 5 MG PO TABS
5.0000 mg | ORAL_TABLET | Freq: Four times a day (QID) | ORAL | Status: DC | PRN
  Administered 2021-05-21 – 2021-05-22 (×3): 5 mg via ORAL
  Filled 2021-05-21 (×3): qty 1

## 2021-05-21 MED ORDER — INSULIN ASPART 100 UNIT/ML IJ SOLN
0.0000 [IU] | Freq: Every day | INTRAMUSCULAR | Status: DC
  Administered 2021-05-21: 3 [IU] via SUBCUTANEOUS
  Filled 2021-05-21: qty 0.05

## 2021-05-21 MED ORDER — LORAZEPAM 2 MG/ML IJ SOLN
1.0000 mg | Freq: Once | INTRAMUSCULAR | Status: DC
Start: 2021-05-21 — End: 2021-05-21

## 2021-05-21 MED ORDER — ACETAMINOPHEN 650 MG RE SUPP: 650.0000 mg | Freq: Four times a day (QID) | RECTAL | Status: DC | PRN

## 2021-05-21 MED ORDER — OXYCODONE-ACETAMINOPHEN 5-325 MG PO TABS
1.0000 | ORAL_TABLET | Freq: Once | ORAL | Status: DC
  Filled 2021-05-21: qty 1

## 2021-05-21 MED ORDER — OXYCODONE HCL 5 MG PO TABS
5.0000 mg | ORAL_TABLET | Freq: Once | ORAL | Status: AC
  Administered 2021-05-21: 5 mg via ORAL
  Filled 2021-05-21: qty 1

## 2021-05-21 MED ORDER — INSULIN GLARGINE-YFGN 100 UNIT/ML ~~LOC~~ SOLN
40.0000 [IU] | Freq: Every day | SUBCUTANEOUS | Status: DC
  Administered 2021-05-21 – 2021-05-24 (×3): 40 [IU] via SUBCUTANEOUS
  Filled 2021-05-21 (×5): qty 0.4

## 2021-05-21 MED ORDER — OXYCODONE-ACETAMINOPHEN 5-325 MG PO TABS
1.0000 | ORAL_TABLET | Freq: Four times a day (QID) | ORAL | Status: DC | PRN
  Administered 2021-05-22: 1 via ORAL
  Filled 2021-05-21 (×2): qty 1

## 2021-05-21 MED ORDER — PIPERACILLIN-TAZOBACTAM 3.375 G IVPB 30 MIN
3.3750 g | Freq: Once | INTRAVENOUS | Status: AC
  Administered 2021-05-21: 3.375 g via INTRAVENOUS
  Filled 2021-05-21: qty 50

## 2021-05-21 MED ORDER — GADOBUTROL 1 MMOL/ML IV SOLN
7.0000 mL | Freq: Once | INTRAVENOUS | Status: AC | PRN
  Administered 2021-05-21: 7 mL via INTRAVENOUS

## 2021-05-21 NOTE — Progress Notes (Signed)
Pharmacy Antibiotic Note ? ?[name] is a 56 y.o. female who was recently hospitalized from 04/22/21 to 05/03/21 for left leg cellulitis/abscess//necrotizing fasciitis.  She underwent left leg excisional debridement with wound VAC placement on 3/15 and another left knee excisional debridement on 3/22. During this admission, she was treated with vancomycin/cefepime, then doxy and discharged on 05/03/21 with cefadroxil through 05/07/21.  She presented back to the ED on 05/21/2021 with c/o LLE swelling and pain.  Pharmacy has been consulted to dose vancomycin and cefepime for LLE cellulitis. ? ?- scr 1.12 (crcl~65) ? ?Plan: ?- vancomycin 1750 mg Iv x1 given in the ED at ~4p, then 1500 mg q24h for est AUC 471 ?- cefepime 2gm q8h ? ?___________________________________________ ? ?Height: 6' (182.9 cm) ?Weight: 76.2 kg (167 lb 15.9 oz) ?IBW/kg (Calculated) : 73.1 ? ?Temp (24hrs), Avg:97.8 ?F (36.6 ?C), Min:97.8 ?F (36.6 ?C), Max:97.8 ?F (36.6 ?C) ? ?Recent Labs  ?Lab 05/21/21 ?1120 05/21/21 ?1505  ?WBC 11.0*  --   ?CREATININE 1.12*  --   ?LATICACIDVEN 2.5* 1.2  ?  ?Estimated Creatinine Clearance: 64.7 mL/min (A) (by C-G formula based on SCr of 1.12 mg/dL (H)).   ? ?Allergies  ?Allergen Reactions  ? Insulin Aspart Other (See Comments) and Swelling  ?  adverse reaction to Novolog ?  ? Latex Itching and Rash  ? Morphine Itching and Rash  ? ? ? ?Thank you for allowing pharmacy to be a part of this patient?s care. ? ?Lynelle Doctor ?05/21/2021 5:06 PM ? ?

## 2021-05-21 NOTE — ED Provider Notes (Signed)
?[facility] DEPT ?Provider Note ? ? ?CSN: [id number] ?Arrival date & time: 05/21/21  1039 ? ?  ? ?History ?Chief Complaint  ?Patient presents with  ? Foot Pain  ? Leg Pain  ? ? ?[name] is a 56 y.o. female. ? ?56 year old female with a past medical history of GERD, DM, neuropathy, hypertension presents to the ED via [facility] with a chief complaint of left leg swelling times pain for the past few days.  Patient had a improvement of her left lower leg by Dr. Sharol Given at the end of March, had a follow-up appointment with him last week where her stitches were removed.  States the pain along the left knee, left ankle, entire lower left leg has continued to worsen.  She is also noted more swelling to the area exacerbated after standing for long peers of time at work.  She did have an MRI of her foot order, however this has not been able to be performed due to problems with insurance.  She was previously admitted into the hospital and placed on IV antibiotics prior to her debridement.  Continues to have generalized fatigue, "like I do not feel well ".  Exacerbated pain along the left lateral malleolus, left midfoot with ambulation.  Denies any fever, drainage from the wound, no prior history of blood clots. ? ?The history is provided by the patient and medical records.  ?Foot Pain ?Pertinent negatives include no chest pain, no abdominal pain, no headaches and no shortness of breath.  ?Leg Pain ?Associated symptoms: no back pain and no fever   ? ?  ? ?Home Medications ?Prior to Admission medications   ?Medication Sig Start Date End Date Taking? Authorizing Provider  ?albuterol (VENTOLIN HFA) 108 (90 Base) MCG/ACT inhaler Inhale 2 puffs into the lungs every 6 (six) hours as needed for wheezing or shortness of breath. 08/03/20   Saguier, Percell Miller, PA-C  ?amLODipine (NORVASC) 10 MG tablet Take 1 tablet by mouth once daily ?Patient taking differently: Take 10 mg by mouth daily. 02/05/21   Saguier,  Percell Miller, PA-C  ?aspirin 81 MG tablet Take 81 mg by mouth daily.    [provider]  ?atorvastatin (LIPITOR) 80 MG tablet Take 1 tablet by mouth once daily ?Patient taking differently: Take 80 mg by mouth daily. 08/06/20   Saguier, Percell Miller, PA-C  ?azelastine (ASTELIN) 0.1 % nasal spray Place 2 sprays into both nostrils 2 (two) times daily. 04/09/20   Colon Branch, MD  ?busPIRone (BUSPAR) 15 MG tablet Take 1 tablet (15 mg total) by mouth 2 (two) times daily. 10/30/20   Saguier, Percell Miller, PA-C  ?carvedilol (COREG) 25 MG tablet Take 1 tablet (25 mg total) by mouth 2 (two) times daily with a meal. 12/17/20   Saguier, Percell Miller, PA-C  ?cholecalciferol (VITAMIN D3) 10 MCG (400 UNIT) TABS tablet Take 1 tablet (400 Units total) by mouth daily. 05/04/21 08/02/21  Terrilee Croak, MD  ?famotidine (PEPCID) 20 MG tablet Take 1 tablet (20 mg total) by mouth daily. 04/03/21   Saguier, Percell Miller, PA-C  ?fluticasone (FLONASE) 50 MCG/ACT nasal spray Place 2 sprays into both nostrils daily. 09/27/19   Saguier, Percell Miller, PA-C  ?fluticasone furoate-vilanterol (BREO ELLIPTA) 100-25 MCG/INH AEPB Inhale 1 puff into the lungs daily. 08/03/20   Saguier, Percell Miller, PA-C  ?gabapentin (NEURONTIN) 300 MG capsule Take 2 capsules (600 mg total) by mouth 3 (three) times daily. 05/10/21   Saguier, Percell Miller, PA-C  ?insulin degludec (TRESIBA) 100 UNIT/ML FlexTouch Pen Inject 40 Units into  the skin daily at 10 pm. ?Patient taking differently: Inject 40 Units into the skin every evening. 11/16/20   Saguier, Percell Miller, PA-C  ?metFORMIN (GLUCOPHAGE-XR) 500 MG 24 hr tablet Take 1 tablet (500 mg total) by mouth daily with breakfast. 01/15/21   Saguier, Percell Miller, PA-C  ?methocarbamol (ROBAXIN) 750 MG tablet Take 1 tablet (750 mg total) by mouth every 8 (eight) hours as needed for muscle spasms. 05/10/21   Saguier, Percell Miller, PA-C  ?nitroGLYCERIN (NITROSTAT) 0.4 MG SL tablet Place 1 tablet (0.4 mg total) under the tongue every 5 (five) minutes as needed for chest pain. 03/21/21   Saguier,  Percell Miller, PA-C  ?promethazine (PHENERGAN) 12.5 MG tablet Take 1 tablet (12.5 mg total) by mouth every 8 (eight) hours as needed. 05/10/21   Saguier, Percell Miller, PA-C  ?rizatriptan (MAXALT-MLT) 10 MG disintegrating tablet Take 10 mg by mouth daily as needed for migraine. 04/17/20   [provider]  ?sertraline (ZOLOFT) 100 MG tablet Take 1 tablet (100 mg total) by mouth daily. 08/03/20   Saguier, Percell Miller, PA-C  ?spironolactone (ALDACTONE) 25 MG tablet Take 1 tablet by mouth once daily ?Patient taking differently: Take 25 mg by mouth daily. 07/21/19   Saguier, Percell Miller, PA-C  ?topiramate (TOPAMAX) 50 MG tablet Take 1 tablet (50 mg total) by mouth 2 (two) times daily. 04/17/20   Penumalli, Earlean Polka, MD  ?   ? ?Allergies    ?Insulin aspart, Latex, and Morphine   ? ?Review of Systems   ?Review of Systems  ?Constitutional:  Negative for chills and fever.  ?Respiratory:  Negative for shortness of breath.   ?Cardiovascular:  Positive for leg swelling. Negative for chest pain.  ?Gastrointestinal:  Negative for abdominal pain and nausea.  ?Genitourinary:  Negative for flank pain.  ?Musculoskeletal:  Negative for back pain.  ?Skin:  Positive for wound. Negative for pallor.  ?Neurological:  Negative for light-headedness and headaches.  ?All other systems reviewed and are negative. ? ?Physical Exam ?Updated Vital Signs ?BP (!) 128/92   Pulse 94   Temp 97.8 ?F (36.6 ?C) (Oral)   Resp 18   Ht 6' (1.829 m)   Wt 76.2 kg   SpO2 100%   BMI 22.78 kg/m?  ?Physical Exam ?Vitals and nursing note reviewed.  ?Constitutional:   ?   Appearance: Normal appearance.  ?HENT:  ?   Head: Normocephalic and atraumatic.  ?Eyes:  ?   Pupils: Pupils are equal, round, and reactive to light.  ?Cardiovascular:  ?   Rate and Rhythm: Normal rate.  ?   Pulses:     ?     Dorsalis pedis pulses are 2+ on the left side.  ?     Posterior tibial pulses are 2+ on the left side.  ?   Comments: Left lower extremity with 1+ pitting edema. Wound noted.  ?Pulmonary:  ?    Effort: Pulmonary effort is normal.  ?   Breath sounds: No wheezing.  ?Abdominal:  ?   General: Abdomen is flat.  ?   Tenderness: There is no abdominal tenderness.  ?Musculoskeletal:     ?   General: Swelling present. No deformity.  ?   Cervical back: Normal range of motion and neck supple.  ?   Left lower leg: 1+ Edema present.  ?Skin: ?   General: Skin is warm and dry.  ?Neurological:  ?   Mental Status: She is alert and oriented to person, place, and time.  ? ? ? ? ? ? ? ?ED Results /  Procedures / Treatments   ?Labs ?(all labs ordered are listed, but only abnormal results are displayed) ?Labs Reviewed  ?CBC WITH DIFFERENTIAL/PLATELET - Abnormal; Notable for the following components:  ?    Result Value  ? WBC 11.0 (*)   ? Hemoglobin 11.6 (*)   ? HCT 35.9 (*)   ? All other components within normal limits  ?COMPREHENSIVE METABOLIC PANEL - Abnormal; Notable for the following components:  ? Glucose, Bld 410 (*)   ? BUN 22 (*)   ? Creatinine, Ser 1.12 (*)   ? Calcium 8.7 (*)   ? Total Protein 8.2 (*)   ? GFR, Estimated 58 (*)   ? All other components within normal limits  ?LACTIC ACID, PLASMA - Abnormal; Notable for the following components:  ? Lactic Acid, Venous 2.5 (*)   ? All other components within normal limits  ?URINALYSIS, ROUTINE W REFLEX MICROSCOPIC - Abnormal; Notable for the following components:  ? APPearance CLOUDY (*)   ? Glucose, UA >=500 (*)   ? Hgb urine dipstick MODERATE (*)   ? Protein, ur 30 (*)   ? Nitrite POSITIVE (*)   ? Leukocytes,Ua LARGE (*)   ? WBC, UA >50 (*)   ? Bacteria, UA MANY (*)   ? All other components within normal limits  ?CULTURE, BLOOD (ROUTINE X 2)  ?CULTURE, BLOOD (ROUTINE X 2)  ?URINE CULTURE  ?LACTIC ACID, PLASMA  ?CBG MONITORING, ED  ? ? ?EKG ?None ? ?Radiology ?MR ANKLE LEFT W WO CONTRAST ? ?Result Date: 05/21/2021 ?CLINICAL DATA:  Entire foot and ankle swelling with difficulty ambulated. Previous toe surgery. EXAM: MRI OF THE LEFT ANKLE WITHOUT AND WITH CONTRAST TECHNIQUE:  Multiplanar, multisequence MR imaging of the ankle was performed before and after the administration of intravenous contrast. CONTRAST:  81m GADAVIST GADOBUTROL 1 MMOL/ML IV SOLN COMPARISON:  Foot and ankle radi

## 2021-05-21 NOTE — ED Notes (Signed)
Waiting for pt to return from MRI. ?

## 2021-05-21 NOTE — ED Notes (Signed)
Pt has not returned from MRI. Will continue care once pt is back in her room.  ?

## 2021-05-21 NOTE — ED Notes (Signed)
US at bedside

## 2021-05-21 NOTE — Progress Notes (Signed)
LLE venous duplex has been completed.  Preliminary results given to Janeece Fitting, PA-C. ? ? ?Results can be found under chart review under CV PROC. ?05/21/2021 1:51 PM ?Stonewall Doss RVT, RDMS ? ?

## 2021-05-21 NOTE — Progress Notes (Signed)
PHARMACY -  BRIEF ANTIBIOTIC NOTE  ? ?Pharmacy has received consult(s) for vancomycin from an ED provider.  The patient's profile has been reviewed for ht/wt/allergies/indication/available labs.   ? ?One time order(s) placed for vancomycin 1750 mg ? ?Further antibiotics/pharmacy consults should be ordered by admitting physician if indicated.       ?                ?Thank you, ? ?Tawnya Crook, PharmD, BCPS ?Clinical Pharmacist ?05/21/2021 1:06 PM ? ? ?

## 2021-05-21 NOTE — H&P (Signed)
?History and Physical  ? ? ?Patient: [name] NLG:[id number] DOB: [birth year] ?DOA: 05/21/2021 ?DOS: the patient was seen and examined on 05/21/2021 ?PCP: Mackie Pai, PA-C  ?Patient coming from: Home ? ?Chief Complaint:  ?Chief Complaint  ?Patient presents with  ? Foot Pain  ? Leg Pain  ? ?HPI: [name] is a 56 y.o. female with medical history significant of DM2, CAD, HTN, HLD, migraines, GERD. Presenting with left foot pain. She was recently in the hospital for LLE cellulitis and abscess. She underwent left leg debridement and left knee debridement. She was sent home on seven days of doxy and cefadroxil. She followed up with orthopedics about a week ago. At that time, she had suture removed. Per her account, orthopedics felt she still had infection at the time and needed an MRI to assess. She was not on abx at the time. She was scheduled for the MRI, but her foot swelling and pain became too much for her to tolerate, so she came to the ED for evaluation. She denies fever, chills, N/V. She reports increased odor and cloudiness of her urine over the last few days. She denies any other aggravating or alleviating factors.    ? ?Review of Systems: As mentioned in the history of present illness. All other systems reviewed and are negative. ?Past Medical History:  ?Diagnosis Date  ? ABDOMINAL PAIN-EPIGASTRIC 09/14/2009  ? Qualifier: Diagnosis of  By: Shane Crutch, [name]   ? Acute cystitis 07/07/2017  ? ANKLE PAIN, LEFT 12/25/2009  ? Qualifier: Diagnosis of  By: Amil Amen MD, Benjamine Mola    ? Anxiety 01/22/2016  ? CAD (coronary artery disease) 07/07/2017  ? Cardiac murmur 02/01/2019  ? Cardiomyopathy, secondary ([place]) 09/06/2009  ? Qualifier: Diagnosis of  By: Arvid Right   Formatting of this note might be different from the original. Overview:  Qualifier: Diagnosis of  By: Arvid Right  ? Cardiomyopathy, unspecified ([place]) 09/06/2009  ? Formatting of this note might be  different from the original. Overview:  Overview:  Qualifier: Diagnosis of  By: Arvid Right  Overview:  Overview:  Qualifier: Diagnosis of  By: Arvid Right Formatting of this note might be different from the original. Overview:  Qualifier: Diagnosis of  By: Arvid Right  ? Cerebrovascular disease 09/13/2012  ? Cervical cancer ([place])   ? had surgery in 2001.  ? CHEST PAIN, ATYPICAL 07/30/2009  ? Qualifier: Diagnosis of  By: Amil Amen MD, Winona Legato of this note might be different from the original. Overview:  Qualifier: Diagnosis of  By: Amil Amen MD, Benjamine Mola  ? Chronic back pain   ? Chronic kidney disease, stage III (moderate) ([place]) 05/23/2014  ? Chronic pain syndrome 01/23/2016  ? Coronary artery disease   ? 30% lesions noted  ? Current use of insulin ([place]) 05/23/2014  ? Depression   ? Diabetes mellitus   ? DIABETES MELLITUS, TYPE II, UNCONTROLLED 07/30/2009  ? Qualifier: Diagnosis of  By: Amil Amen MD, Benjamine Mola    ? Diabetes type 2, uncontrolled 05/23/2014  ? Diabetic gastroparesis associated with type 1 diabetes mellitus ([place]) 11/27/2016  ? DIABETIC PERIPHERAL NEUROPATHY 09/21/2009  ? Qualifier: Diagnosis of  By: Amil Amen MD, Benjamine Mola    ? Diabetic peripheral neuropathy ([place]) 01/23/2016  ? Dysuria 12/25/2009  ? Qualifier: Diagnosis of  By: Amil Amen MD, Benjamine Mola    ? Elevation of level of transaminase or lactic acid dehydrogenase (LDH) 09/21/2009  ? Formatting of this  note might be different from the original. Overview:  Qualifier: Diagnosis of  By: Amil Amen MD, Benjamine Mola  ? Essential hypertension 08/30/2009  ? Qualifier: Diagnosis of  By: Lovette Cliche CNA, Christy    ? Gastric atony 07/30/2009  ? Formatting of this note might be different from the original. Overview:  Qualifier: Diagnosis of  By: Amil Amen MD, Benjamine Mola  ? Gastroesophageal reflux disease 07/30/2009  ? Formatting of this note might be different from the original. Overview:  Overview:  Qualifier:  Diagnosis of  By: Amil Amen MD, Elby Showers: Diagnosis of  By: Chester Holstein NP, Ellwood Handler of this note might be different from the original. Overview:  Qualifier: Diagnosis of  By: Amil Amen MD, Elby Showers: Diagnosis of  By: Chester Holstein NP, Nevin Bloodgood  ? Gastroparesis 07/30/2009  ? Qualifier: Diagnosis of  By: Amil Amen MD, Benjamine Mola    ? GERD 07/30/2009  ? Qualifier: Diagnosis of  By: Amil Amen MD, Elby Showers: Diagnosis of  By: Chester Holstein NP, Nevin Bloodgood    ? GERD (gastroesophageal reflux disease)   ? History of cervical cancer 08/17/2015  ? Status post partial hysterectomy  Formatting of this note might be different from the original. Status post partial hysterectomy  ? HLD (hyperlipidemia) 07/30/2009  ? Qualifier: Diagnosis of  By: Amil Amen MD, Benjamine Mola    ? Hyperlipidemia   ? Hyperlipidemia, unspecified 07/30/2009  ? Formatting of this note might be different from the original. Overview:  Qualifier: Diagnosis of  By: Amil Amen MD, Winona Legato of this note might be different from the original. Overview:  Qualifier: Diagnosis of  By: Amil Amen MD, Benjamine Mola  ? Hypertension   ? INSOMNIA 09/21/2009  ? Qualifier: Diagnosis of  By: Amil Amen MD, Benjamine Mola    ? Lumbar facet arthropathy 05/14/2016  ? Lumbar spinal stenosis 02/11/2018  ? Metatarsalgia of both feet 03/11/2017  ? Migraine 09/13/2012  ? Overview:  IMPRESSION: possible abd migraine  ? Mixed dyslipidemia 02/01/2019  ? Nausea & vomiting 07/07/2017  ? Nausea with vomiting, unspecified 09/14/2009  ? Qualifier: Diagnosis of  By: Shane Crutch, [name]   Formatting of this note might be different from the original. Overview:  Qualifier: Diagnosis of  By: Shane Crutch, [name]  ? Neuropathy   ? Nonspecific abnormal findings on radiological and examination of skull and head 09/13/2012  ? Overweight (BMI 25.0-29.9) 07/21/2014  ? POSITIVE PPD 07/30/2009  ? Annotation: CXR clear ?  diagnosed in 2/11?  On INH/pyridoxine Qualifier: Diagnosis of  By: Amil Amen MD,  Benjamine Mola    ? Pure hypercholesterolemia 11/27/2016  ? Retention of urine 06/25/2011  ? Right flank pain   ? TOBACCO ABUSE 09/21/2009  ? Qualifier: Diagnosis of  By: Amil Amen MD, Winona Legato of this note might be different from the original. Overview:  Qualifier: Diagnosis of  By: Amil Amen MD, Benjamine Mola  ? TRANSAMINASES, SERUM, ELEVATED 09/21/2009  ? Qualifier: Diagnosis of  By: Amil Amen MD, Benjamine Mola    ? Trigeminal neuralgia   ? Tuberculin test reaction 07/30/2009  ? Formatting of this note might be different from the original. Overview:  Annotation: CXR clear ?  diagnosed in 2/11?  On INH/pyridoxine Qualifier: Diagnosis of  By: Amil Amen MD, Benjamine Mola  ? Type 2 diabetes mellitus with hyperglycemia ([place]) 07/30/2009  ? Qualifier: Diagnosis of  By: Amil Amen MD, Winona Legato of this note might be different from the original. Overview:  Qualifier: Diagnosis of  By: Amil Amen MD, Benjamine Mola  ? Type 2 diabetes mellitus with hyperglycemia, with  long-term current use of insulin ([place]) 07/30/2009  ? Formatting of this note might be different from the original. Overview:  05/30/2015 A1C was 16.7% (!)  Overview:  05/30/2015 A1C was 16.7% Kingsley Callander of this note might be different from the original. 05/30/2015 A1C was 16.7% (!)  ? Type 2 diabetes mellitus with stage 3 chronic kidney disease ([place]) 09/14/2009  ? Qualifier: Diagnosis of  By: Shane Crutch, [name]   ? ULCER-GASTRIC 09/28/2009  ? Qualifier: Diagnosis of  By: Chester Holstein NP, Nevin Bloodgood    ? URINARY TRACT INFECTION 09/21/2009  ? Qualifier: Diagnosis of  By: Amil Amen MD, Benjamine Mola    ? VAGINITIS, CANDIDAL 12/25/2009  ? Qualifier: Diagnosis of  By: Amil Amen MD, Benjamine Mola    ? Vitamin D deficiency 04/25/2013  ? ?Past Surgical History:  ?Procedure Laterality Date  ? ABDOMINAL HYSTERECTOMY    ? partial  ? CHOLECYSTECTOMY    ? I & D EXTREMITY Left 04/24/2021  ? Procedure: LEFT LEG DEBRIDEMENT;  Surgeon: [name], MD;  Location: [facility];  Service: Orthopedics;   Laterality: Left;  ? I & D EXTREMITY Left 05/01/2021  ? Procedure: LEFT KNEE DEBRIDEMENT;  Surgeon: [name], MD;  Location: [facility];  Service: Orthopedics;  Laterality: Left;  ? ?Social History:  reports that she has never

## 2021-05-21 NOTE — ED Notes (Signed)
Pt transported to MRI 

## 2021-05-21 NOTE — ED Triage Notes (Signed)
Pt presents with pain and swelling in left lower extremity. Surgery in March, incision still has unhealed areas. Pt states swelling started with pain in left lateral ankle and top of left foot. ?

## 2021-05-22 DIAGNOSIS — A419 Sepsis, unspecified organism: Principal | ICD-10-CM

## 2021-05-22 LAB — CBC
HCT: 32 % — ABNORMAL LOW (ref 36.0–46.0)
Hemoglobin: 10.6 g/dL — ABNORMAL LOW (ref 12.0–15.0)
MCH: 30 pg (ref 26.0–34.0)
MCHC: 33.1 g/dL (ref 30.0–36.0)
MCV: 90.7 fL (ref 80.0–100.0)
Platelets: 267 10*3/uL (ref 150–400)
RBC: 3.53 MIL/uL — ABNORMAL LOW (ref 3.87–5.11)
RDW: 15 % (ref 11.5–15.5)
WBC: 7.4 10*3/uL (ref 4.0–10.5)
nRBC: 0 % (ref 0.0–0.2)

## 2021-05-22 LAB — GLUCOSE, CAPILLARY
Glucose-Capillary: 55 mg/dL — ABNORMAL LOW (ref 70–99)
Glucose-Capillary: 127 mg/dL — ABNORMAL HIGH (ref 70–99)
Glucose-Capillary: 233 mg/dL — ABNORMAL HIGH (ref 70–99)
Glucose-Capillary: 320 mg/dL — ABNORMAL HIGH (ref 70–99)
Glucose-Capillary: 152 mg/dL — ABNORMAL HIGH (ref 70–99)

## 2021-05-22 LAB — COMPREHENSIVE METABOLIC PANEL
ALT: 12 U/L (ref 0–44)
AST: 13 U/L — ABNORMAL LOW (ref 15–41)
Albumin: 3.1 g/dL — ABNORMAL LOW (ref 3.5–5.0)
Alkaline Phosphatase: 69 U/L (ref 38–126)
Anion gap: 7 (ref 5–15)
BUN: 17 mg/dL (ref 6–20)
CO2: 27 mmol/L (ref 22–32)
Calcium: 8.6 mg/dL — ABNORMAL LOW (ref 8.9–10.3)
Chloride: 105 mmol/L (ref 98–111)
Creatinine, Ser: 0.82 mg/dL (ref 0.44–1.00)
GFR, Estimated: >60 mL/min (ref >60)
Glucose, Bld: 191 mg/dL — ABNORMAL HIGH (ref 70–99)
Potassium: 3.9 mmol/L (ref 3.5–5.1)
Sodium: 139 mmol/L (ref 135–145)
Total Bilirubin: 0.4 mg/dL (ref 0.3–1.2)
Total Protein: 6.9 g/dL (ref 6.5–8.1)

## 2021-05-22 LAB — CBG MONITORING, ED: Glucose-Capillary: 156 mg/dL — ABNORMAL HIGH (ref 70–99)

## 2021-05-22 MED ORDER — FLUTICASONE PROPIONATE 50 MCG/ACT NA SUSP
2.0000 | Freq: Every day | NASAL | Status: DC
  Administered 2021-05-22 – 2021-05-25 (×4): 2 via NASAL
  Filled 2021-05-22: qty 16

## 2021-05-22 MED ORDER — AZELASTINE HCL 0.1 % NA SOLN
2.0000 | Freq: Two times a day (BID) | NASAL | Status: DC
  Administered 2021-05-22 – 2021-05-25 (×4): 2 via NASAL
  Filled 2021-05-22: qty 30

## 2021-05-22 MED ORDER — HYDROMORPHONE HCL 1 MG/ML IJ SOLN
0.5000 mg | INTRAMUSCULAR | Status: DC | PRN
Start: 2021-05-22 — End: 2021-05-25
  Administered 2021-05-22 – 2021-05-25 (×16): 0.5 mg via INTRAVENOUS
  Filled 2021-05-22 (×16): qty 0.5

## 2021-05-22 MED ORDER — SERTRALINE HCL 100 MG PO TABS
100.0000 mg | ORAL_TABLET | Freq: Every day | ORAL | Status: DC
  Administered 2021-05-22 – 2021-05-25 (×4): 100 mg via ORAL
  Filled 2021-05-22 (×5): qty 1

## 2021-05-22 MED ORDER — MELOXICAM 7.5 MG PO TABS
7.5000 mg | ORAL_TABLET | Freq: Two times a day (BID) | ORAL | Status: DC
  Administered 2021-05-22 – 2021-05-25 (×6): 7.5 mg via ORAL
  Filled 2021-05-22 (×7): qty 1

## 2021-05-22 MED ORDER — PROMETHAZINE HCL 25 MG PO TABS
12.5000 mg | ORAL_TABLET | Freq: Four times a day (QID) | ORAL | Status: DC | PRN
  Administered 2021-05-22 – 2021-05-24 (×6): 12.5 mg via ORAL
  Filled 2021-05-22 (×6): qty 1

## 2021-05-22 MED ORDER — HYDROXYZINE HCL 10 MG PO TABS
10.0000 mg | ORAL_TABLET | Freq: Three times a day (TID) | ORAL | Status: DC | PRN
  Administered 2021-05-22 – 2021-05-24 (×3): 10 mg via ORAL
  Filled 2021-05-22 (×4): qty 1

## 2021-05-22 MED ORDER — OXYCODONE-ACETAMINOPHEN 5-325 MG PO TABS
1.0000 | ORAL_TABLET | ORAL | Status: DC | PRN
  Filled 2021-05-22 (×2): qty 1

## 2021-05-22 MED ORDER — VANCOMYCIN HCL IN DEXTROSE 1-5 GM/200ML-% IV SOLN
1000.0000 mg | Freq: Two times a day (BID) | INTRAVENOUS | Status: DC
  Administered 2021-05-22 – 2021-05-25 (×6): 1000 mg via INTRAVENOUS
  Filled 2021-05-22 (×7): qty 200

## 2021-05-22 MED ORDER — BUSPIRONE HCL 5 MG PO TABS
15.0000 mg | ORAL_TABLET | Freq: Two times a day (BID) | ORAL | Status: DC
  Administered 2021-05-22 – 2021-05-25 (×6): 15 mg via ORAL
  Filled 2021-05-22 (×6): qty 3

## 2021-05-22 MED ORDER — AMLODIPINE BESYLATE 5 MG PO TABS
5.0000 mg | ORAL_TABLET | Freq: Every day | ORAL | Status: DC
  Administered 2021-05-22 – 2021-05-25 (×4): 5 mg via ORAL
  Filled 2021-05-22 (×4): qty 1

## 2021-05-22 MED ORDER — CARVEDILOL 25 MG PO TABS
25.0000 mg | ORAL_TABLET | Freq: Two times a day (BID) | ORAL | Status: DC
  Administered 2021-05-22 – 2021-05-25 (×6): 25 mg via ORAL
  Filled 2021-05-22 (×6): qty 1

## 2021-05-22 MED ORDER — FAMOTIDINE 20 MG PO TABS
20.0000 mg | ORAL_TABLET | Freq: Every day | ORAL | Status: DC
  Administered 2021-05-22 – 2021-05-25 (×4): 20 mg via ORAL
  Filled 2021-05-22 (×4): qty 1

## 2021-05-22 MED ORDER — FLUTICASONE FUROATE-VILANTEROL 100-25 MCG/ACT IN AEPB
1.0000 | INHALATION_SPRAY | Freq: Every day | RESPIRATORY_TRACT | Status: DC
  Administered 2021-05-23 – 2021-05-25 (×3): 1 via RESPIRATORY_TRACT
  Filled 2021-05-22: qty 28

## 2021-05-22 MED ORDER — DIPHENHYDRAMINE HCL 25 MG PO CAPS
25.0000 mg | ORAL_CAPSULE | Freq: Once | ORAL | Status: AC
  Administered 2021-05-22: 25 mg via ORAL
  Filled 2021-05-22: qty 1

## 2021-05-22 MED ORDER — DIPHENHYDRAMINE HCL 25 MG PO CAPS
25.0000 mg | ORAL_CAPSULE | Freq: Three times a day (TID) | ORAL | Status: DC | PRN
  Administered 2021-05-22: 25 mg via ORAL
  Filled 2021-05-22: qty 1

## 2021-05-22 MED ORDER — ATORVASTATIN CALCIUM 40 MG PO TABS
80.0000 mg | ORAL_TABLET | Freq: Every day | ORAL | Status: DC
  Administered 2021-05-22 – 2021-05-25 (×4): 80 mg via ORAL
  Filled 2021-05-22 (×4): qty 2

## 2021-05-22 MED ORDER — ASPIRIN EC 81 MG PO TBEC
81.0000 mg | DELAYED_RELEASE_TABLET | Freq: Every day | ORAL | Status: DC
  Administered 2021-05-22 – 2021-05-25 (×4): 81 mg via ORAL
  Filled 2021-05-22 (×5): qty 1

## 2021-05-22 MED ORDER — ALBUTEROL SULFATE (2.5 MG/3ML) 0.083% IN NEBU: 3.0000 mL | INHALATION_SOLUTION | Freq: Four times a day (QID) | RESPIRATORY_TRACT | Status: DC | PRN

## 2021-05-22 MED ORDER — DIPHENHYDRAMINE-ZINC ACETATE 2-0.1 % EX CREA
TOPICAL_CREAM | CUTANEOUS | Status: DC | PRN
  Filled 2021-05-22: qty 28

## 2021-05-22 MED ORDER — CHOLECALCIFEROL 10 MCG (400 UNIT) PO TABS
400.0000 [IU] | ORAL_TABLET | Freq: Every day | ORAL | Status: DC
  Administered 2021-05-22 – 2021-05-25 (×4): 400 [IU] via ORAL
  Filled 2021-05-22 (×4): qty 1

## 2021-05-22 MED ORDER — GABAPENTIN 300 MG PO CAPS
600.0000 mg | ORAL_CAPSULE | Freq: Three times a day (TID) | ORAL | Status: DC
  Administered 2021-05-22 – 2021-05-25 (×10): 600 mg via ORAL
  Filled 2021-05-22 (×10): qty 2

## 2021-05-22 NOTE — Progress Notes (Signed)
?  Progress Note ? ? ?Patient: [name]:[id number] DOB: 02-Mar-1965 DOA: 05/21/2021     1 ?DOS: the patient was seen and examined on 05/22/2021 ?  ?Brief hospital course: ?56 y.o. female with medical history significant of DM2, CAD, HTN, HLD, migraines, GERD. Presenting with left foot pain. She was recently in the hospital for LLE cellulitis and abscess. She underwent left leg debridement and left knee debridement. She was sent [facility] on seven days of doxy and cefadroxil. She followed up with orthopedics about a week ago. At that time, she had suture removed. Per her account, orthopedics felt she still had infection at the time and needed an MRI to assess. She was not on abx at the time. She was scheduled for the MRI, but her foot swelling and pain became too much for her to tolerate, so she came to the ED for evaluation.  ED provider spoke with Dr. Due to and reviewed MRI findings and recommended IV antibiotics and outpatient follow-up. ? ?Assessment and Plan: ?Left lower extremity cellulitis: Improving.  Continue vancomycin and cefepime.  Blood cultures negative to date.  Lactic acidosis resolved.  MRI showed no abscess or osteomyelitis. ? ?Serratia marcescens UTI: Continue cefepime ? ?Diabetes mellitus: Uncontrolled continue SSI, glucose checks, DM diet, semglee, gabapentin ? ?Chronic diastolic CHF, hypertension: Continue [facility] amlodipine, carvedilol ?Acute decompensation ? ?Migraines: Continue [facility] Topamax ? ?CAD, HLD: Continue [facility] atorvastatin and aspirin ? ?Depression/anxiety: Continue Peck Zoloft and buspirone ? ?Subjective: Swelling has improved.  Still having itching.  Her pain is still not well controlled.  No fevers or chills. ? ?Physical Exam: ?Vitals:  ? 05/22/21 0830 05/22/21 0850 05/22/21 0953 05/22/21 1318  ?BP:  116/81 (!) 156/98 109/76  ?Pulse: 85 79 85 84  ?Resp: '15 14 18 16  '$ ?Temp:   98.5 ?F (36.9 ?C) 97.9 ?F (36.6 ?C)  ?TempSrc:   Oral Oral  ?SpO2: 100% 99% 100% 99%  ?Weight:       ?Height:      ? ?General: 56 y.o. female resting in bed in NAD ?Eyes: PERRL, normal sclera ?ENMT: Nares patent w/o discharge, orophaynx clear, dentition normal, ears w/o discharge/lesions/ulcers ?Neck: Supple, trachea midline ?Cardiovascular: RRR, +S1, S2, no m/g/r, equal pulses throughout ?Respiratory: CTABL, no w/r/r, normal WOB ?GI: BS+, NDNT, no masses noted, no organomegaly noted ?MSK: No c/c; LLE edema, TTP dorsum of foot ?Neuro: A&O x 3, no focal deficits ?Psyc: Appropriate interaction and affect, calm/cooperative ?  ? ?Data Reviewed: ?Labs, imaging, micro ? ?Family Communication: None present at bedside ? ?Disposition: ?Status is: Inpatient ?Remains inpatient appropriate because: IV antibiotics ? Planned Discharge Destination: [facility] ?DVT prophylaxis: Lovenox subcu ? ?Time spent: 45 minutes ? ?Author: ?Brenda Home, DO ?05/22/2021 5:09 PM ? ?For on call review [email or web address].  ?

## 2021-05-22 NOTE — Progress Notes (Signed)
Arrived to patient's room. Patient is currently in the shower. Notified nurse and requested she reconsult [facility] when patient is back in bed and ready for PIV assessment/placement. VU Fran Lowes, RN [facility] ?

## 2021-05-22 NOTE — Progress Notes (Addendum)
Pharmacy Antibiotic Note ? ?[name] is a 56 y.o. female who was recently hospitalized from 04/22/21 to 05/03/21 for left leg cellulitis/abscess//necrotizing fasciitis. She underwent left leg excisional debridement with wound VAC placement on 04/24/21 and another left knee excisional debridement on 05/01/21. During that admission, she was treated with Vancomycin and Cefepime, then discharged on Doxycycline and Cefadroxil through 05/07/21.  She presented back to the ED on 05/21/21 with c/o LLE swelling and pain. Also noted increased odor and cloudiness of urine. Pharmacy has been consulted to dose Vancomycin and Cefepime for LLE cellulitis and UTI. ? ?Plan: ?- Adjust Vancomycin to 1g IV q12h due to improvement in renal function to keep AUC 400-550 ?- Vancomycin levels at steady state, as indicated ?- Continue Cefepime 2g IV q8h ?- Monitor renal function, cultures, clinical course ? ?Height: 6' (182.9 cm) ?Weight: 76.2 kg (167 lb 15.9 oz) ?IBW/kg (Calculated) : 73.1 ? ?Temp (24hrs), Avg:98.2 ?F (36.8 ?C), Min:97.9 ?F (36.6 ?C), Max:98.5 ?F (36.9 ?C) ? ?Recent Labs  ?Lab 05/21/21 ?1120 05/21/21 ?1505 05/22/21 ?0610  ?WBC 11.0*  --  7.4  ?CREATININE 1.12*  --  0.82  ?LATICACIDVEN 2.5* 1.2  --   ? ?  ?Estimated Creatinine Clearance: 88.4 mL/min (by C-G formula based on SCr of 0.82 mg/dL).   ? ?Allergies  ?Allergen Reactions  ? Insulin Aspart Other (See Comments) and Swelling  ?  adverse reaction to Novolog ?  ? Latex Itching and Rash  ? Morphine Itching and Rash  ? ? ?Antimicrobials this admission:  ?4/11 Zosyn x 1 ?4/11 Vancomycin >> ?4/11 Cefepime >> ? ?Microbiology results:  ?4/11 BCx: NGTD ?4/11 UCx: > 100K Serratia marcescens  ? ? ?Thank you for allowing pharmacy to be a part of this patient?s care. ? ? ?Lindell Spar, PharmD, BCPS ?Clinical Pharmacist  ?05/22/2021 2:41 PM ? ?

## 2021-05-23 DIAGNOSIS — E1142 Type 2 diabetes mellitus with diabetic polyneuropathy: Secondary | ICD-10-CM

## 2021-05-23 DIAGNOSIS — I2510 Atherosclerotic heart disease of native coronary artery without angina pectoris: Secondary | ICD-10-CM

## 2021-05-23 DIAGNOSIS — I5032 Chronic diastolic (congestive) heart failure: Secondary | ICD-10-CM

## 2021-05-23 DIAGNOSIS — L03116 Cellulitis of left lower limb: Secondary | ICD-10-CM | POA: Diagnosis not present

## 2021-05-23 DIAGNOSIS — A419 Sepsis, unspecified organism: Secondary | ICD-10-CM | POA: Diagnosis not present

## 2021-05-23 LAB — CBC
HCT: 33 % — ABNORMAL LOW (ref 36.0–46.0)
Hemoglobin: 10.4 g/dL — ABNORMAL LOW (ref 12.0–15.0)
MCH: 29.1 pg (ref 26.0–34.0)
MCHC: 31.5 g/dL (ref 30.0–36.0)
MCV: 92.2 fL (ref 80.0–100.0)
Platelets: 248 10*3/uL (ref 150–400)
RBC: 3.58 MIL/uL — ABNORMAL LOW (ref 3.87–5.11)
RDW: 15.1 % (ref 11.5–15.5)
WBC: 6.3 10*3/uL (ref 4.0–10.5)
nRBC: 0 % (ref 0.0–0.2)

## 2021-05-23 LAB — BASIC METABOLIC PANEL
Anion gap: 4 — ABNORMAL LOW (ref 5–15)
BUN: 26 mg/dL — ABNORMAL HIGH (ref 6–20)
CO2: 28 mmol/L (ref 22–32)
Calcium: 8.2 mg/dL — ABNORMAL LOW (ref 8.9–10.3)
Chloride: 106 mmol/L (ref 98–111)
Creatinine, Ser: 0.96 mg/dL (ref 0.44–1.00)
GFR, Estimated: >60 mL/min (ref >60)
Glucose, Bld: 207 mg/dL — ABNORMAL HIGH (ref 70–99)
Potassium: 4.5 mmol/L (ref 3.5–5.1)
Sodium: 138 mmol/L (ref 135–145)

## 2021-05-23 LAB — URINE CULTURE: Culture: >=100000 — AB

## 2021-05-23 LAB — GLUCOSE, CAPILLARY
Glucose-Capillary: 109 mg/dL — ABNORMAL HIGH (ref 70–99)
Glucose-Capillary: 61 mg/dL — ABNORMAL LOW (ref 70–99)
Glucose-Capillary: 138 mg/dL — ABNORMAL HIGH (ref 70–99)
Glucose-Capillary: 233 mg/dL — ABNORMAL HIGH (ref 70–99)
Glucose-Capillary: 74 mg/dL (ref 70–99)

## 2021-05-23 NOTE — Progress Notes (Signed)
?  Transition of Care (TOC) Screening Note ? ? ?Patient Details  ?Name: [name] ?Date of Birth: Nov 11, 1965 ? ? ?Transition of Care (TOC) CM/SW Contact:    ?Chanoch Mccleery, LCSW ?Phone Number: ?05/23/2021, 2:05 PM ? ? ? ?Transition of Care Department [facility]) has reviewed patient and no TOC needs have been identified at this time. We will continue to monitor patient advancement through interdisciplinary progression rounds. If new patient transition needs arise, please place a TOC consult. ? ? ?

## 2021-05-23 NOTE — Progress Notes (Signed)
Inpatient Diabetes Program Recommendations ? ?AACE/ADA: New Consensus Statement on Inpatient Glycemic Control (2015) ? ?Target Ranges:  Prepandial:   less than 140 mg/dL ?     Peak postprandial:   less than 180 mg/dL (1-2 hours) ?     Critically ill patients:  140 - 180 mg/dL  ? ?Lab Results  ?Component Value Date  ? GLUCAP 138 (H) 05/23/2021  ? HGBA1C 12.3 (H) 04/23/2021  ? ? ?Review of Glycemic Control ? ?Diabetes history: DM2 ?Outpatient Diabetes medications: metformin 500 mg BID, Tresiba 20 units BID ?Current orders for Inpatient glycemic control: Semglee 40 units QHS, Novolog 0-15 TID with meals and 0-5 HS ? ?HgbA1C - 12.3% - may not be accurate with low H/H ?Hypo of 61 mg/dL this am ? ?Inpatient Diabetes Program Recommendations:   ? ?Decrease Semglee to 38 units QHS ?Add Novolog 4 units TID with meals if eating > 50% ? ?Spoke with pt at bedside regarding her diabetes control and HgbA1C of 12.3%. Pt states blood sugars are usually in lower 100s in the morning, but tend to go up during the day. Has never been on rapid-acting insulin at home. Encouraged pt to take blood sugar logs to PCP for review of post-prandials. May benefit from addition of Novolog/Humalog with meals. Pt states she attempts to eat healthy meals. Likely infection causing hyperglycemia. Denies any hypos at home.  ?Pt to f/u with PCP after discharge. ? ?Thank you. ?Lorenda Peck, RD, LDN, CDE ?Inpatient Diabetes Coordinator ?[phone]  ? ? ? ? ? ? ? ? ? ?

## 2021-05-23 NOTE — Progress Notes (Signed)
?PROGRESS NOTE ? ?GIARA MCGAUGHEY OEU:[id number] DOB: 1965/12/31 DOA: 05/21/2021 ?PCP: Mackie Pai, PA-C ? ?Brief History   ?56 y.o. female with medical history significant of DM2, CAD, HTN, HLD, migraines, GERD. Presenting with left foot pain. She was recently in the hospital for LLE cellulitis and abscess. She underwent left leg debridement and left knee debridement. She was sent home on seven days of doxy and cefadroxil. She followed up with orthopedics about a week ago. At that time, she had suture removed. Per her account, orthopedics felt she still had infection at the time and needed an MRI to assess. She was not on abx at the time. She was scheduled for the MRI, but her foot swelling and pain became too much for her to tolerate, so she came to the ED for evaluation.  ED provider spoke with Dr. Due to and reviewed MRI findings and recommended IV antibiotics and outpatient follow-up. ? ?Consultants  ?None ? ?Procedures  ?None ? ?Antibiotics  ? ?Anti-infectives (From admission, onward)  ? ? Start     Dose/Rate Route Frequency Ordered Stop  ? 05/22/21 1600  vancomycin (VANCOREADY) IVPB 1500 mg/300 mL  Status:  Discontinued       ? 1,500 mg ?150 mL/hr over 120 Minutes Intravenous Every 24 hours 05/21/21 1720 05/22/21 1444  ? 05/22/21 1600  vancomycin (VANCOCIN) IVPB 1000 mg/200 mL premix       ? 1,000 mg ?200 mL/hr over 60 Minutes Intravenous Every 12 hours 05/22/21 1444    ? 05/21/21 2100  ceFEPIme (MAXIPIME) 2 g in sodium chloride 0.9 % 100 mL IVPB       ? 2 g ?200 mL/hr over 30 Minutes Intravenous Every 8 hours 05/21/21 1720    ? 05/21/21 1315  piperacillin-tazobactam (ZOSYN) IVPB 3.375 g       ? 3.375 g ?100 mL/hr over 30 Minutes Intravenous  Once 05/21/21 1304 05/21/21 1558  ? 05/21/21 1315  vancomycin (VANCOREADY) IVPB 1750 mg/350 mL       ? 1,750 mg ?175 mL/hr over 120 Minutes Intravenous  Once 05/21/21 1306 05/21/21 1816  ? ?  ? ?Subjective  ?The patient is resting comfortably. She continues to  complain of significant pain in her left lower extremity and posterior left knee. ? ?Objective  ? ?Vitals:  ?Vitals:  ? 05/23/21 1100 05/23/21 1257  ?BP:  111/89  ?Pulse:  73  ?Resp:  15  ?Temp:  (!) 97.4 ?F (36.3 ?C)  ?SpO2: 90% 94%  ? ? ?Exam: ? ?Constitutional:  ?The patient is awake, alert, and oriented x 3. Mild distress from left lower extremity leg. ?Respiratory:  ?No increased work of breathing. ?No wheezes, rales, or rhonchi ?No tactile fremitus ?Cardiovascular:  ?Regular rate and rhythm ?No murmurs, ectopy, or gallups. ?No lateral PMI. No thrills. ?Abdomen:  ?Abdomen is soft, non-tender, non-distended ?No hernias, masses, or organomegaly ?Normoactive bowel sounds.  ?Musculoskeletal:  ?Mild edema to foot of left lower extremity. No cyanosis or clubbing to lower extremities bilaterally. ?Skin:  ?Left lower extremity with erythema, induration and warmth. Tenderness to palpation. ?Neurologic:  ?CN 2-12 intact ?Sensation all 4 extremities intact ?Psychiatric:  ?Mental status ?Mood, affect appropriate ?Orientation to person, place, time  ?judgment and insight appear intact ? ? ?I have personally reviewed the following:  ? ?Today's Data  ?Vitals ? ?Lab Data  ?CBC, BMP ? ?Micro Data  ?Urine culture has grown out serratia marscesens ?Blood culture has had no growth. ? ?Imaging  ?MRI left foot and ankle. ? ?  Cardiology Data  ? ? ?Other Data  ? ? ?Scheduled Meds: ? amLODipine  5 mg Oral Daily  ? aspirin EC  81 mg Oral Daily  ? atorvastatin  80 mg Oral Daily  ? azelastine  2 spray Each Nare BID  ? busPIRone  15 mg Oral BID  ? carvedilol  25 mg Oral BID WC  ? cholecalciferol  400 Units Oral Daily  ? enoxaparin (LOVENOX) injection  40 mg Subcutaneous Q24H  ? famotidine  20 mg Oral Daily  ? fluticasone  2 spray Each Nare Daily  ? fluticasone furoate-vilanterol  1 puff Inhalation Daily  ? gabapentin  600 mg Oral TID  ? insulin aspart  0-15 Units Subcutaneous TID WC  ? insulin aspart  0-5 Units Subcutaneous QHS  ? insulin  glargine-yfgn  40 Units Subcutaneous QHS  ? meloxicam  7.5 mg Oral BID  ? sertraline  100 mg Oral Daily  ? ?Continuous Infusions: ? ceFEPime (MAXIPIME) IV 2 g (05/23/21 1315)  ? vancomycin 1,000 mg (05/23/21 1638)  ? ? ?Principal Problem: ?  Sepsis ([place]) ?Active Problems: ?  Cellulitis of left leg ?  Dyslipidemia ?  DM2 (diabetes mellitus, type 2) ([place]) ?  HLD (hyperlipidemia) ?  Essential hypertension ?  GERD ?  UTI (urinary tract infection) ?  CAD (coronary artery disease) ?  Diabetic peripheral neuropathy ([place]) ?  Migraine ?  Hyperlipidemia, unspecified ?  Coronary artery disease ?  Chronic diastolic HF (heart failure) () ? ? ?A & P  ?Left lower extremity cellulitis: Improving.  Continue vancomycin and cefepime.  Blood cultures negative to date.  Lactic acidosis resolved.  MRI showed no abscess or osteomyelitis. There remains tissue warmth, erythema, tenderness and induration. Continue IV antibiotics. ?  ?Serratia marcescens UTI: Continue cefepime ?  ?Diabetes mellitus: Uncontrolled continue SSI, glucose checks, DM diet, semglee, gabapentin ?  ?Chronic diastolic CHF, hypertension: Continue home amlodipine, carvedilol ?Acute decompensation ?  ?Migraines: Continue home Topamax ?  ?CAD, HLD: Continue home atorvastatin and aspirin ?  ?Depression/anxiety: Continue home Zoloft and buspirone ? ?I have seen and examined this patient myself.  ? ?DVT prophylaxis: Lovenox ?Code Status: Full Code ?Family Communication: None available ?Disposition Plan: Home  ? ? ?Courtenay Creger, DO ?[facility] ?Direct contact: see [email or web address]  ?7PM-7AM contact night coverage as above ?05/23/2021, 6:58 PM  LOS: 2 days  ? ? LOS: 2 days  ? ? ? ? ? ? ?  ?

## 2021-05-24 DIAGNOSIS — I5032 Chronic diastolic (congestive) heart failure: Secondary | ICD-10-CM | POA: Diagnosis not present

## 2021-05-24 DIAGNOSIS — I2510 Atherosclerotic heart disease of native coronary artery without angina pectoris: Secondary | ICD-10-CM | POA: Diagnosis not present

## 2021-05-24 DIAGNOSIS — A419 Sepsis, unspecified organism: Secondary | ICD-10-CM | POA: Diagnosis not present

## 2021-05-24 DIAGNOSIS — L03116 Cellulitis of left lower limb: Secondary | ICD-10-CM | POA: Diagnosis not present

## 2021-05-24 LAB — GLUCOSE, CAPILLARY
Glucose-Capillary: 119 mg/dL — ABNORMAL HIGH (ref 70–99)
Glucose-Capillary: 248 mg/dL — ABNORMAL HIGH (ref 70–99)
Glucose-Capillary: 342 mg/dL — ABNORMAL HIGH (ref 70–99)
Glucose-Capillary: 172 mg/dL — ABNORMAL HIGH (ref 70–99)

## 2021-05-24 LAB — CREATININE, SERUM
Creatinine, Ser: 0.78 mg/dL (ref 0.44–1.00)
GFR, Estimated: >60 mL/min (ref >60)

## 2021-05-24 MED ORDER — INSULIN ASPART 100 UNIT/ML IJ SOLN
3.0000 [IU] | Freq: Three times a day (TID) | INTRAMUSCULAR | Status: DC
  Administered 2021-05-24 – 2021-05-25 (×3): 3 [IU] via SUBCUTANEOUS

## 2021-05-24 MED ORDER — INSULIN ASPART 100 UNIT/ML IJ SOLN: 0.0000 [IU] | Freq: Three times a day (TID) | INTRAMUSCULAR | Status: DC

## 2021-05-24 MED ORDER — INSULIN ASPART 100 UNIT/ML IJ SOLN: 0.0000 [IU] | Freq: Every day | INTRAMUSCULAR | Status: DC

## 2021-05-24 NOTE — Progress Notes (Addendum)
Pharmacy Antibiotic Note ? ?[name] is a 56 y.o. female who was recently hospitalized from 04/22/21 to 05/03/21 for left leg cellulitis/abscess//necrotizing fasciitis. She underwent left leg excisional debridement with wound VAC placement on 04/24/21 and another left knee excisional debridement on 05/01/21. During that admission, she was treated with Vancomycin and Cefepime, then discharged on Doxycycline and Cefadroxil through 05/07/21.  She presented back to the ED on 05/21/21 with c/o LLE swelling and pain. Also noted increased odor and cloudiness of urine. Pharmacy has been consulted to dose Vancomycin and Cefepime for LLE cellulitis and UTI. ? ?Plan: ?- Continue Vancomycin 1g IV q12h, goal AUC 400-550 ?- Vancomycin levels at steady state, as indicated ?- Continue Cefepime 2g IV q8h ?- Monitor renal function, cultures, clinical course, and for ability to transition to PO antibiotics ? ?Height: 6' (182.9 cm) ?Weight: 76.2 kg (167 lb 15.9 oz) ?IBW/kg (Calculated) : 73.1 ? ?Temp (24hrs), Avg:98 ?F (36.7 ?C), Min:97.7 ?F (36.5 ?C), Max:98.3 ?F (36.8 ?C) ? ?Recent Labs  ?Lab 05/21/21 ?1120 05/21/21 ?1505 05/22/21 ?0610 05/23/21 ?0442 05/24/21 ?8921  ?WBC 11.0*  --  7.4 6.3  --   ?CREATININE 1.12*  --  0.82 0.96 0.78  ?LATICACIDVEN 2.5* 1.2  --   --   --   ? ?  ?Estimated Creatinine Clearance: 90.6 mL/min (by C-G formula based on SCr of 0.78 mg/dL).   ? ?Allergies  ?Allergen Reactions  ? Insulin Aspart Other (See Comments) and Swelling  ?  adverse reaction to Novolog ?  ? Latex Itching and Rash  ? Morphine Itching and Rash  ? ? ?Antimicrobials this admission:  ?4/11 Zosyn x 1 ?4/11 Vancomycin >> ?4/11 Cefepime >> ? ?Microbiology results:  ?4/11 BCx: NGTD ?4/11 UCx: > 100K Serratia marcescens (S cefepime, ceftriaxone, cipro, gentamicin, bactrim) ? ? ?Thank you for allowing pharmacy to be a part of this patient?s care. ? ? ?Lindell Spar, PharmD, BCPS ?Clinical Pharmacist  ?05/24/2021 2:55 PM ? ?

## 2021-05-24 NOTE — Progress Notes (Signed)
?PROGRESS NOTE ? ?[name]:[id number] DOB: 1965/10/31 DOA: 05/21/2021 ?PCP: Mackie Pai, PA-C ? ?Brief History   ?56 y.o. female with medical history significant of DM2, CAD, HTN, HLD, migraines, GERD. Presenting with left foot pain. She was recently in the hospital for LLE cellulitis and abscess. She underwent left leg debridement and left knee debridement. She was sent home on seven days of doxy and cefadroxil. She followed up with orthopedics about a week ago. At that time, she had suture removed. Per her account, orthopedics felt she still had infection at the time and needed an MRI to assess. She was not on abx at the time. She was scheduled for the MRI, but her foot swelling and pain became too much for her to tolerate, so she came to the ED for evaluation.  ED provider spoke with Dr. Due to and reviewed MRI findings and recommended IV antibiotics and outpatient follow-up. ? ?Consultants  ?None ? ?Procedures  ?None ? ?Antibiotics  ? ?Anti-infectives (From admission, onward)  ? ? Start     Dose/Rate Route Frequency Ordered Stop  ? 05/22/21 1600  vancomycin (VANCOREADY) IVPB 1500 mg/300 mL  Status:  Discontinued       ? 1,500 mg ?150 mL/hr over 120 Minutes Intravenous Every 24 hours 05/21/21 1720 05/22/21 1444  ? 05/22/21 1600  vancomycin (VANCOCIN) IVPB 1000 mg/200 mL premix       ? 1,000 mg ?200 mL/hr over 60 Minutes Intravenous Every 12 hours 05/22/21 1444    ? 05/21/21 2100  ceFEPIme (MAXIPIME) 2 g in sodium chloride 0.9 % 100 mL IVPB       ? 2 g ?200 mL/hr over 30 Minutes Intravenous Every 8 hours 05/21/21 1720    ? 05/21/21 1315  piperacillin-tazobactam (ZOSYN) IVPB 3.375 g       ? 3.375 g ?100 mL/hr over 30 Minutes Intravenous  Once 05/21/21 1304 05/21/21 1558  ? 05/21/21 1315  vancomycin (VANCOREADY) IVPB 1750 mg/350 mL       ? 1,750 mg ?175 mL/hr over 120 Minutes Intravenous  Once 05/21/21 1306 05/21/21 1816  ? ?  ? ?Subjective  ?The patient is resting comfortably. No new  complaints. ? ?Objective  ? ?Vitals:  ?Vitals:  ? 05/24/21 0543 05/24/21 1346  ?BP: (!) 139/99 (!) 134/91  ?Pulse: 78 78  ?Resp: 16 18  ?Temp: 97.7 ?F (36.5 ?C) 98.3 ?F (36.8 ?C)  ?SpO2: 96% 99%  ? ? ?Exam: ? ?Constitutional:  ?The patient is awake, alert, and oriented x 3. No acute distress. ?Respiratory:  ?No increased work of breathing. ?No wheezes, rales, or rhonchi ?No tactile fremitus ?Cardiovascular:  ?Regular rate and rhythm ?No murmurs, ectopy, or gallups. ?No lateral PMI. No thrills. ?Abdomen:  ?Abdomen is soft, non-tender, non-distended ?No hernias, masses, or organomegaly ?Normoactive bowel sounds.  ?Musculoskeletal:  ?Only slight edema to foot of left lower extremity. No cyanosis or clubbing to lower extremities bilaterally. ?Skin:  ?Left lower extremity with improvement in erythema, induration and warmth. Tenderness to palpation is also resolving. ?Neurologic:  ?CN 2-12 intact ?Sensation all 4 extremities intact ?Psychiatric:  ?Mental status ?Mood, affect appropriate ?Orientation to person, place, time  ?judgment and insight appear intact ? ? ?I have personally reviewed the following:  ? ?Today's Data  ?Vitals ? ?Lab Data  ?Glucoses are high.  ? ?Micro Data  ?Urine culture has grown out serratia marscesens ?Blood culture has had no growth. ? ?Imaging  ?MRI left foot and ankle. ? ?Cardiology Data  ? ? ?  Other Data  ? ? ?Scheduled Meds: ? amLODipine  5 mg Oral Daily  ? aspirin EC  81 mg Oral Daily  ? atorvastatin  80 mg Oral Daily  ? azelastine  2 spray Each Nare BID  ? busPIRone  15 mg Oral BID  ? carvedilol  25 mg Oral BID WC  ? cholecalciferol  400 Units Oral Daily  ? enoxaparin (LOVENOX) injection  40 mg Subcutaneous Q24H  ? famotidine  20 mg Oral Daily  ? fluticasone  2 spray Each Nare Daily  ? fluticasone furoate-vilanterol  1 puff Inhalation Daily  ? gabapentin  600 mg Oral TID  ? insulin aspart  0-15 Units Subcutaneous TID WC  ? insulin aspart  0-5 Units Subcutaneous QHS  ? insulin glargine-yfgn   40 Units Subcutaneous QHS  ? meloxicam  7.5 mg Oral BID  ? sertraline  100 mg Oral Daily  ? ?Continuous Infusions: ? ceFEPime (MAXIPIME) IV 2 g (05/24/21 1229)  ? vancomycin 1,000 mg (05/24/21 1520)  ? ? ?Principal Problem: ?  Sepsis ([place]) ?Active Problems: ?  Cellulitis of left leg ?  Dyslipidemia ?  DM2 (diabetes mellitus, type 2) ([place]) ?  HLD (hyperlipidemia) ?  Essential hypertension ?  GERD ?  UTI (urinary tract infection) ?  CAD (coronary artery disease) ?  Diabetic peripheral neuropathy ([place]) ?  Migraine ?  Hyperlipidemia, unspecified ?  Coronary artery disease ?  Chronic diastolic HF (heart failure) ([place]) ? ? ?A & P  ?Left lower extremity cellulitis: Improving.  Continue vancomycin and cefepime.  Blood cultures negative to date.  Lactic acidosis resolved.  MRI showed no abscess or osteomyelitis. There remains tissue warmth, erythema, tenderness and induration. Continue IV antibiotics. ?  ?Serratia marcescens UTI: Continue cefepime ?  ?Diabetes mellitus: Uncontrolled continue SSI, glucose checks, DM diet, semglee, gabapentin. High glucose mid-day and this afternoon. Will add prandial insulin. ?  ?Chronic diastolic CHF, hypertension: Continue home amlodipine, carvedilol ?Acute decompensation ?  ?Migraines: Continue home Topamax ?  ?CAD, HLD: Continue home atorvastatin and aspirin ?  ?Depression/anxiety: Continue home Zoloft and buspirone ? ?I have seen and examined this patient myself.  ? ?DVT prophylaxis: Lovenox ?Code Status: Full Code ?Family Communication: None available ?Disposition Plan: Home  ? ? ?Haidyn Chadderdon, DO ?[facility] ?Direct contact: see [email or web address]  ?7PM-7AM contact night coverage as above ?05/24/2021, 5:10 PM  LOS: 2 days  ? ? LOS: 3 days  ? ? ? ? ? ? ?  ?

## 2021-05-25 DIAGNOSIS — A419 Sepsis, unspecified organism: Secondary | ICD-10-CM | POA: Diagnosis not present

## 2021-05-25 DIAGNOSIS — I5032 Chronic diastolic (congestive) heart failure: Secondary | ICD-10-CM | POA: Diagnosis not present

## 2021-05-25 DIAGNOSIS — I2510 Atherosclerotic heart disease of native coronary artery without angina pectoris: Secondary | ICD-10-CM | POA: Diagnosis not present

## 2021-05-25 DIAGNOSIS — L03116 Cellulitis of left lower limb: Secondary | ICD-10-CM | POA: Diagnosis not present

## 2021-05-25 LAB — CBC WITH DIFFERENTIAL/PLATELET
Abs Immature Granulocytes: 0.03 10*3/uL (ref 0.00–0.07)
Basophils Absolute: 0 10*3/uL (ref 0.0–0.1)
Basophils Relative: 1 %
Eosinophils Absolute: 0.5 10*3/uL (ref 0.0–0.5)
Eosinophils Relative: 8 %
HCT: 32.3 % — ABNORMAL LOW (ref 36.0–46.0)
Hemoglobin: 10.3 g/dL — ABNORMAL LOW (ref 12.0–15.0)
Immature Granulocytes: 1 %
Lymphocytes Relative: 42 %
Lymphs Abs: 2.5 10*3/uL (ref 0.7–4.0)
MCH: 29.3 pg (ref 26.0–34.0)
MCHC: 31.9 g/dL (ref 30.0–36.0)
MCV: 92 fL (ref 80.0–100.0)
Monocytes Absolute: 0.8 10*3/uL (ref 0.1–1.0)
Monocytes Relative: 13 %
Neutro Abs: 2.1 10*3/uL (ref 1.7–7.7)
Neutrophils Relative %: 35 %
Platelets: 232 10*3/uL (ref 150–400)
RBC: 3.51 MIL/uL — ABNORMAL LOW (ref 3.87–5.11)
RDW: 15.2 % (ref 11.5–15.5)
WBC: 5.9 10*3/uL (ref 4.0–10.5)
nRBC: 0 % (ref 0.0–0.2)

## 2021-05-25 LAB — GLUCOSE, CAPILLARY
Glucose-Capillary: 139 mg/dL — ABNORMAL HIGH (ref 70–99)
Glucose-Capillary: 51 mg/dL — ABNORMAL LOW (ref 70–99)
Glucose-Capillary: 99 mg/dL (ref 70–99)
Glucose-Capillary: 192 mg/dL — ABNORMAL HIGH (ref 70–99)

## 2021-05-25 LAB — BASIC METABOLIC PANEL
Anion gap: 5 (ref 5–15)
BUN: 31 mg/dL — ABNORMAL HIGH (ref 6–20)
CO2: 28 mmol/L (ref 22–32)
Calcium: 8.5 mg/dL — ABNORMAL LOW (ref 8.9–10.3)
Chloride: 104 mmol/L (ref 98–111)
Creatinine, Ser: 0.94 mg/dL (ref 0.44–1.00)
GFR, Estimated: >60 mL/min (ref >60)
Glucose, Bld: 273 mg/dL — ABNORMAL HIGH (ref 70–99)
Potassium: 4.5 mmol/L (ref 3.5–5.1)
Sodium: 137 mmol/L (ref 135–145)

## 2021-05-25 MED ORDER — AMOXICILLIN-POT CLAVULANATE 875-125 MG PO TABS: 1.0000 | ORAL_TABLET | Freq: Two times a day (BID) | ORAL | 0 refills | Status: AC

## 2021-05-25 NOTE — Plan of Care (Signed)

## 2021-05-25 NOTE — Progress Notes (Signed)
Assessment unchanged. Pt verbalized understanding of dc instructions including meds and follow up care. Discharged to front entrance accompanied by NT.  ?

## 2021-05-25 NOTE — Progress Notes (Signed)
Communicated my concerns w/ MD about pt possibly becoming dependent on dilaudid.  ?

## 2021-05-25 NOTE — Progress Notes (Signed)
Hypoglycemic Event ? ?CBG: 51 @ 0940 ? ?Treatment: 8 oz juice/soda also peanut butter w/crackers-- breakfast tray arrived 34mn after pt   ? ?Symptoms:  feeling low and weak  ? ?Follow-up CBG: Time: 05056CBG Result: 99 ? ?Possible Reasons for Event: Unknown ? ?Comments/MD notified: Dr SBenny Lennertaware.no new orders ? ? ? ?PTanda Rockers? ? ?

## 2021-05-26 LAB — CULTURE, BLOOD (ROUTINE X 2)
Culture: NO GROWTH
Culture: NO GROWTH
Special Requests: ADEQUATE
Special Requests: ADEQUATE

## 2021-05-26 NOTE — Discharge Summary (Signed)
?Physician Discharge Summary ?  ?Patient: [name] MRN: [id number] DOB: Aug 06, 1965  ?Admit date:     05/21/2021  ?Discharge date: 05/25/2021  ?Discharge Physician: Perez Dirico  ? ?PCP: Mackie Pai, PA-C  ? ?Recommendations at discharge:  ? ? Discharge to home.  ?Follow up with PCP in 7-10 days. ?Follow up with Dr. Jolyn Nap. Call for an appointment. ? ?Discharge Diagnoses: ?Principal Problem: ?  Sepsis ([place]) ?Active Problems: ?  Cellulitis of left leg ?  Dyslipidemia ?  DM2 (diabetes mellitus, type 2) ([place]) ?  HLD (hyperlipidemia) ?  Essential hypertension ?  GERD ?  UTI (urinary tract infection) ?  CAD (coronary artery disease) ?  Diabetic peripheral neuropathy ([place]) ?  Migraine ?  Hyperlipidemia, unspecified ?  Coronary artery disease ?  Chronic diastolic HF (heart failure) ([place]) ? ?Resolved Problems: ?  * No resolved hospital problems. * ? ?Hospital Course: ?56 y.o. female with medical history significant of DM2, CAD, HTN, HLD, migraines, GERD. Presenting with left foot pain. She was recently in the hospital for LLE cellulitis and abscess. She underwent left leg debridement and left knee debridement. She was sent home on seven days of doxy and cefadroxil. She followed up with orthopedics about a week ago. At that time, she had suture removed. Per her account, orthopedics felt she still had infection at the time and needed an MRI to assess. She was not on abx at the time. She was scheduled for the MRI, but her foot swelling and pain became too much for her to tolerate, so she came to the ED for evaluation.  ED provider spoke with Dr. Due to and reviewed MRI findings and recommended IV antibiotics and outpatient follow-up. ? ?The patient was admitted to a medical bed. She received IV Vancomycin and Cefepime, as well as pain control. The lower extremity gradually improved. She was discharged to home in fair condition on oral antibiotics. ? ?Assessment and Plan: ?Left lower extremity cellulitis: Improving.   Continue vancomycin and cefepime.  Blood cultures negative to date.  Lactic acidosis resolved.  MRI showed no abscess or osteomyelitis. The lower extremity looks much better. She will be discharged to home on oral antibiotics. ?  ?Serratia marcescens UTI: The patient has concluded the course of antibiotics for this infection. ?  ?Diabetes mellitus: Uncontrolled continue SSI, glucose checks, DM diet, semglee, gabapentin. High glucose mid-day and this afternoon. Will add prandial insulin. ?  ?Chronic diastolic CHF, hypertension: Continue home amlodipine, carvedilol ?Acute decompensation ?  ?Migraines: Continue home Topamax ?  ?CAD, HLD: Continue home atorvastatin and aspirin ?  ?Depression/anxiety: Continue home Zoloft and buspiron ? ?Consultants: None ?Procedures performed: None  ?Disposition: Home ?Diet recommendation:  ?Discharge Diet Orders (From admission, onward)  ? ?  Start     Ordered  ? 05/25/21 0000  Diet - low sodium heart healthy       ? 05/25/21 1344  ? ?  ?  ? ?  ? ?Cardiac diet ?DISCHARGE MEDICATION: ?Allergies as of 05/25/2021   ? ?   Reactions  ? Insulin Aspart Other (See Comments), Swelling  ? adverse reaction to Novolog  ? Latex Itching, Rash  ? Morphine Itching, Rash  ? ?  ? ?  ?Medication List  ?  ? ?TAKE these medications   ? ?albuterol 108 (90 Base) MCG/ACT inhaler ?Commonly known as: VENTOLIN HFA ?Inhale 2 puffs into the lungs every 6 (six) hours as needed for wheezing or shortness of breath. ?  ?amLODipine 5 MG  tablet ?Commonly known as: NORVASC ?Take 5 mg by mouth daily. ?  ?amoxicillin-clavulanate 875-125 MG tablet ?Commonly known as: Augmentin ?Take 1 tablet by mouth 2 (two) times daily for 5 days. ?  ?aspirin 81 MG tablet ?Take 81 mg by mouth daily. ?  ?atorvastatin 80 MG tablet ?Commonly known as: LIPITOR ?Take 1 tablet by mouth once daily ?  ?azelastine 0.1 % nasal spray ?Commonly known as: ASTELIN ?Place 2 sprays into both nostrils 2 (two) times daily. ?  ?busPIRone 15 MG  tablet ?Commonly known as: BUSPAR ?Take 1 tablet (15 mg total) by mouth 2 (two) times daily. ?What changed:  ?when to take this ?reasons to take this ?  ?carvedilol 25 MG tablet ?Commonly known as: COREG ?Take 1 tablet (25 mg total) by mouth 2 (two) times daily with a meal. ?  ?cholecalciferol 10 MCG (400 UNIT) Tabs tablet ?Commonly known as: VITAMIN D3 ?Take 1 tablet (400 Units total) by mouth daily. ?  ?famotidine 20 MG tablet ?Commonly known as: PEPCID ?Take 1 tablet (20 mg total) by mouth daily. ?  ?fluticasone 50 MCG/ACT nasal spray ?Commonly known as: FLONASE ?Place 2 sprays into both nostrils daily. ?  ?fluticasone furoate-vilanterol 100-25 MCG/INH Aepb ?Commonly known as: Breo Ellipta ?Inhale 1 puff into the lungs daily. ?  ?gabapentin 300 MG capsule ?Commonly known as: NEURONTIN ?Take 2 capsules (600 mg total) by mouth 3 (three) times daily. ?  ?insulin degludec 100 UNIT/ML FlexTouch Pen ?Commonly known as: TRESIBA ?Inject 40 Units into the skin daily at 10 pm. ?What changed: when to take this ?  ?meloxicam 7.5 MG tablet ?Commonly known as: MOBIC ?Take 7.5 mg by mouth 2 (two) times daily. ?  ?metFORMIN 500 MG 24 hr tablet ?Commonly known as: GLUCOPHAGE-XR ?Take 1 tablet (500 mg total) by mouth daily with breakfast. ?What changed: when to take this ?  ?methocarbamol 750 MG tablet ?Commonly known as: ROBAXIN ?Take 1 tablet (750 mg total) by mouth every 8 (eight) hours as needed for muscle spasms. ?  ?nitroGLYCERIN 0.4 MG SL tablet ?Commonly known as: NITROSTAT ?Place 1 tablet (0.4 mg total) under the tongue every 5 (five) minutes as needed for chest pain. ?  ?OxyCODONE HCl (Abuse Deter) 5 MG Taba ?Commonly known as: OXAYDO ?Take 1 tablet by mouth 2 (two) times daily. ?  ?promethazine 12.5 MG tablet ?Commonly known as: PHENERGAN ?Take 1 tablet (12.5 mg total) by mouth every 8 (eight) hours as needed. ?What changed: reasons to take this ?  ?sertraline 100 MG tablet ?Commonly known as: ZOLOFT ?Take 1 tablet (100  mg total) by mouth daily. ?  ?topiramate 50 MG tablet ?Commonly known as: TOPAMAX ?Take 1 tablet (50 mg total) by mouth 2 (two) times daily. ?What changed:  ?when to take this ?reasons to take this ?  ? ?  ? ? ?Discharge Exam: ?Filed Weights  ? 05/21/21 1048  ?Weight: 76.2 kg  ? ?Exam: ?  ?Constitutional:  ?The patient is awake, alert, and oriented x 3. No acute distress. ?Respiratory:  ?No increased work of breathing. ?No wheezes, rales, or rhonchi ?No tactile fremitus ?Cardiovascular:  ?Regular rate and rhythm ?No murmurs, ectopy, or gallups. ?No lateral PMI. No thrills. ?Abdomen:  ?Abdomen is soft, non-tender, non-distended ?No hernias, masses, or organomegaly ?Normoactive bowel sounds.  ?Musculoskeletal:  ?Only slight edema to foot of left lower extremity. No cyanosis or clubbing to lower extremities bilaterally. ?Skin:  ?Left lower extremity with improvement in erythema, induration and warmth. Tenderness to palpation is  also resolving. ?Neurologic:  ?CN 2-12 intact ?Sensation all 4 extremities intact ?Psychiatric:  ?Mental status ?Mood, affect appropriate ?Orientation to person, place, time  ?judgment and insight appear intact ? ?Condition at discharge: fair ? ?The results of significant diagnostics from this hospitalization (including imaging, microbiology, ancillary and laboratory) are listed below for reference.  ? ?Imaging Studies: ?MR FOOT LEFT W WO CONTRAST ? ?Result Date: 05/21/2021 ?CLINICAL DATA:  Entire foot and ankle swelling with difficulty ambulated. Previous toe surgery. EXAM: MRI OF THE LEFT FOREFOOT WITHOUT AND WITH CONTRAST TECHNIQUE: Multiplanar, multisequence MR imaging of the left forefoot was performed both before and after administration of intravenous contrast. CONTRAST:  74m GADAVIST GADOBUTROL 1 MMOL/ML IV SOLN COMPARISON:  Radiographs 05/14/2021 FINDINGS: Bones/Joint/Cartilage Postsurgical changes in the 1st metatarsal head with associated susceptibility artifact related to metallic  wires. Underlying moderate degenerative changes at the 1st metatarsophalangeal joint. In correlation with the recent radiographs, there is fragmentation of the 3rd metatarsal head without evidence of associated T2 hyperin

## 2021-05-27 ENCOUNTER — Telehealth: Payer: Self-pay

## 2021-05-27 NOTE — Telephone Encounter (Signed)
Transition Care Management Follow-up Telephone Call ?Date of discharge and from where: [facility] 05-25-21 Dx: Sepsis ?How have you been since you were released from the hospital? Doing ok  ?Any questions or concerns? No ? ?Items Reviewed: ?Did the pt receive and understand the discharge instructions provided? Yes  ?Medications obtained and verified? Yes  ?Other? No  ?Any new allergies since your discharge? No  ?Dietary orders reviewed? Yes ?Do you have support at home? Yes  ? ?Home Care and Equipment/Supplies: ?Were home health services ordered? no ?If so, what is the name of the agency? na  ?Has the agency set up a time to come to the patient's home? not applicable ?Were any new equipment or medical supplies ordered?  No ?What is the name of the medical supply agency? na ?Were you able to get the supplies/equipment? not applicable ?Do you have any questions related to the use of the equipment or supplies? No ? ?Functional Questionnaire: (I = Independent and D = Dependent) ?ADLs: I ? ?Bathing/Dressing- I ? ?Meal Prep- I ? ?Eating- I ? ?Maintaining continence- I ? ?Transferring/Ambulation- I ? ?Managing Meds- I ? ?Follow up appointments reviewed: ? ?PCP Hospital f/u appt confirmed? Yes  Scheduled to see Dr Harvie Heck on 05-28-21 @ 920am. ?[facility] f/u appt confirmed? Yes  Scheduled to see Dr Coralyn Pear on 05-29-21 @ 2pm. ?Are transportation arrangements needed? No  ?If their condition worsens, is the pt aware to call PCP or go to the Emergency Dept.? Yes ?Was the patient provided with contact information for the PCP's office or ED? Yes ?Was to pt encouraged to call back with questions or concerns? Yes  ?

## 2021-05-27 NOTE — Telephone Encounter (Signed)
40 min appointment slot approved by AF , appointment changed  ?

## 2021-05-28 ENCOUNTER — Ambulatory Visit (INDEPENDENT_AMBULATORY_CARE_PROVIDER_SITE_OTHER): Payer: 59 | Admitting: Medical

## 2021-05-28 ENCOUNTER — Ambulatory Visit (HOSPITAL_BASED_OUTPATIENT_CLINIC_OR_DEPARTMENT_OTHER)
Admission: RE | Admit: 2021-05-28 | Discharge: 2021-05-28 | Disposition: A | Discharge status: Home / Self Care | Payer: 59 | Source: Ambulatory Visit | Attending: Medical | Admitting: Medical

## 2021-05-28 VITALS — BP 122/88 | HR 84 | Temp 98.2°F | Resp 18 | Ht 72.0 in | Wt 178.6 lb

## 2021-05-28 DIAGNOSIS — M898X6 Other specified disorders of bone, lower leg: Secondary | ICD-10-CM | POA: Insufficient documentation

## 2021-05-28 DIAGNOSIS — E1165 Type 2 diabetes mellitus with hyperglycemia: Secondary | ICD-10-CM | POA: Diagnosis not present

## 2021-05-28 DIAGNOSIS — L089 Local infection of the skin and subcutaneous tissue, unspecified: Secondary | ICD-10-CM | POA: Diagnosis not present

## 2021-05-28 DIAGNOSIS — Z794 Long term (current) use of insulin: Secondary | ICD-10-CM | POA: Diagnosis not present

## 2021-05-28 LAB — COMPREHENSIVE METABOLIC PANEL
ALT: 74 U/L — ABNORMAL HIGH (ref 0–35)
AST: 70 U/L — ABNORMAL HIGH (ref 0–37)
Albumin: 3.9 g/dL (ref 3.5–5.2)
Alkaline Phosphatase: 88 U/L (ref 39–117)
BUN: 26 mg/dL — ABNORMAL HIGH (ref 6–23)
CO2: 27 mEq/L (ref 19–32)
Calcium: 9 mg/dL (ref 8.4–10.5)
Chloride: 100 mEq/L (ref 96–112)
Creatinine, Ser: 0.9 mg/dL (ref 0.40–1.20)
GFR: 71.51 mL/min (ref >60.00)
Glucose, Bld: 197 mg/dL — ABNORMAL HIGH (ref 70–99)
Potassium: 4.5 mEq/L (ref 3.5–5.1)
Sodium: 137 mEq/L (ref 135–145)
Total Bilirubin: 0.4 mg/dL (ref 0.2–1.2)
Total Protein: 7.5 g/dL (ref 6.0–8.3)

## 2021-05-28 LAB — CBC WITH DIFFERENTIAL/PLATELET
Basophils Absolute: 0 10*3/uL (ref 0.0–0.1)
Basophils Relative: 0.4 % (ref 0.0–3.0)
Eosinophils Absolute: 0.4 10*3/uL (ref 0.0–0.7)
Eosinophils Relative: 6.3 % — ABNORMAL HIGH (ref 0.0–5.0)
HCT: 34.3 % — ABNORMAL LOW (ref 36.0–46.0)
Hemoglobin: 11.2 g/dL — ABNORMAL LOW (ref 12.0–15.0)
Lymphocytes Relative: 39 % (ref 12.0–46.0)
Lymphs Abs: 2.7 10*3/uL (ref 0.7–4.0)
MCHC: 32.7 g/dL (ref 30.0–36.0)
MCV: 90.4 fl (ref 78.0–100.0)
Monocytes Absolute: 0.7 10*3/uL (ref 0.1–1.0)
Monocytes Relative: 10.6 % (ref 3.0–12.0)
Neutro Abs: 3.1 10*3/uL (ref 1.4–7.7)
Neutrophils Relative %: 43.7 % (ref 43.0–77.0)
Platelets: 278 10*3/uL (ref 150.0–400.0)
RBC: 3.79 Mil/uL — ABNORMAL LOW (ref 3.87–5.11)
RDW: 15.5 % (ref 11.5–15.5)
WBC: 7 10*3/uL (ref 4.0–10.5)

## 2021-05-28 NOTE — Progress Notes (Signed)
? ?Subjective:  ? ? Patient ID: [name], female    DOB: 1965/02/20, 56 y.o.   MRN: [id number] ? ?HPI ? ?Pt in for follow up. ? ?Pt has been hospitalized twice. She states first time about 11 days then had readmission. Pt did not follow up with me between admission. ? ? ?Pt not sure when he follow up with ID MD.  ? ?Admit date: 04/22/2021 ?Discharge date: 05/03/2021 ?  ?Admitted From: Home ?Discharge disposition: Home ?  ?Recommendations at discharge:  ?Continue oral antibiotics till 3/28 ?Follow-up with Dr. Sharol Given as an outpatient ?Improved diabetes control ?First Dc summary.  ? ? ?Brief narrative: ?[name] is a 56 y.o. female with PMH significant for DM2, HTN, HLD, CAD, cardiomyopathy, CVA, CKD, diabetic gastroparesis, diabetic neuropathy, GERD, depression ?Patient presented to the ED on 3/13 with complaint of left leg cellulitis not responding to a course of oral doxycycline as an outpatient. ?It started as a small swelling and blister that noted on 3/9.  It enlarged and the blister ruptured on 3/11. ?  ?Patient was admitted for sepsis secondary to left leg cellulitis and abscess. ?Seen by Dr. Sharol Given. ?3/15, underwent left leg excisional debridement and wound VAC placement. ?Over the next few days, patient developed a new ulcer more proximally. ?3/22, patient underwent left knee excisional debridement. ? ? ?Subjective: ?Patient was seen and examined this morning.  ?Not in distress.  ?  ?Principal Problem: ?  Cellulitis of left leg ?Active Problems: ?  Sepsis due to cellulitis [facility]) ?  Severe sepsis ([place]) ?  Chest pain, unspecified ?  Acute kidney injury superimposed on chronic kidney disease ([place]) ?  Uncontrolled type 2 diabetes mellitus with hyperglycemia, without long-term current use of insulin ([place]) ?  Dyslipidemia ?  Abscess of left lower leg ?  Necrotizing fasciitis ([place]) ? ?Hospital course ?Sepsis -POA ?Left leg cellulitis/abscess/Necrotizing fasciitis ?Left knee necrotizing  fasciitis ?-Presented with worsening cellulitis of left leg with central pus collection. ?-3/15, underwent left leg excisional debridement, found to have necrotizing fasciitis, underwent wound VAC placement by Dr. Sharol Given.   ?-After few days, patient was noted to have a new area of collection in the lateral aspect of her left knee. ?-3/22, patient underwent left knee excisional debridement.  Per surgical note, necrotic fascia was excised.  Muscle was healthy and viable without any purulence or gas in the soft tissue.   ?-ID consult appreciated.  Previously on broad-spectrum antibiotics.  Patient has been switched to oral doxycycline and cefadroxil for 4 more days until 05/07/2021.  Patient will follow-up with ID Dr. Gale Journey in [place] clinic on 3/29 at 4 PM. ?-Per Dr. Sharol Given.  Patient to continue dry dressing change at home and to follow-up with him in the office in 1 week. ?Last Labs   ?        ?Recent Labs  ?Lab 04/27/21 ?9784 04/28/21 ?7841 04/29/21 ?2820 05/01/21 ?8138 05/02/21 ?8719 05/03/21 ?5974  ?WBC 10.9* 10.9* 10.5 9.0 8.1 8.0  ?LATICACIDVEN 1.1  --   --   --   --   --   ?  ?  ?AKI on CKD2 ?-Creatinine elevated on admission probably because of sepsis. Improved with IV fluid.  ?Recent Labs (within last 365 days)  ?            ?Recent Labs  ?  04/23/21 ?0240 04/24/21 ?0208 04/25/21 ?0200 04/26/21 ?0232 04/27/21 ?7185 04/28/21 ?5015 04/29/21 ?8682 05/01/21 ?5749 05/02/21 ?3552 05/03/21 ?1747  ?BUN 14 12 25*  $'17 13 13 14 11 14 'S$ 21*  ?CREATININE 1.15* 1.21* 1.08* 1.21* 1.08* 0.97 1.04* 0.85 0.96 1.02*  ?  ?  ?Uncontrolled type 2 diabetes mellitus ?Hyperglycemia and hypoglycemia ?Diabetic neuropathy ?-Patient had a significant worsening of A1c from 7.9 on July 2022 to 12.3 on 04/13/2021. ?-Home meds include Tresiba 40 units daily, metformin. ?-While in the hospital, patient's blood sugar control is good.  She states that she has inconsistent compliance with her meds and also has an easy access to candies and Gatorade at her work.    ?-Resume the same antidiabetic regimen post discharge.  Recommend to improve compliance. ?-Continue gabapentin for neuropathy ?Last Labs   ?       ?Recent Labs  ?Lab 05/02/21 ?1111 05/02/21 ?1556 05/02/21 ?2110 05/03/21 ?0645 05/03/21 ?1037  ?GLUCAP 207* 258* 276* 303* 116*  ?  ?  ?Chronic diastolic CHF ?Essential hypertension ?-3/14 echocardiogram with EF 60 to 65%, no wall motion abnormality.   ?-Home meds include carvedilol 25 mg twice daily, amlodipine 10 mg daily Aldactone 25 mg daily, ?-Continue the same post discharge. ?  ?History of CVA  ?Dyslipidemia ?-Continue aspirin and statin  ? ?2nd admission DC summary.  ? ?Admit date:     05/21/2021  ?Discharge date: 05/25/2021  ?Discharge Physician: Ava Swayze  ? ? ? ?PCP: Mackie Pai, PA-C  ?  ?Recommendations at discharge:  ?  ? Discharge to home.  ?Follow up with PCP in 7-10 days. ?Follow up with Dr. Jolyn Nap. Call for an appointment. ? ?Discharge Diagnoses: ?Principal Problem: ?  Sepsis ([place]) ?Active Problems: ?  Cellulitis of left leg ?  Dyslipidemia ?  DM2 (diabetes mellitus, type 2) ([place]) ?  HLD (hyperlipidemia) ?  Essential hypertension ?  GERD ?  UTI (urinary tract infection) ?  CAD (coronary artery disease) ?  Diabetic peripheral neuropathy ([place]) ?  Migraine ?  Hyperlipidemia, unspecified ?  Coronary artery disease ?  Chronic diastolic HF (heart failure) ([place]) ?  ?Resolved Problems: ?  * No resolved hospital problems. * ? ? ?Hospital Course: ?56 y.o. female with medical history significant of DM2, CAD, HTN, HLD, migraines, GERD. Presenting with left foot pain. She was recently in the hospital for LLE cellulitis and abscess. She underwent left leg debridement and left knee debridement. She was sent home on seven days of doxy and cefadroxil. She followed up with orthopedics about a week ago. At that time, she had suture removed. Per her account, orthopedics felt she still had infection at the time and needed an MRI to assess. She was not on abx at the time.  She was scheduled for the MRI, but her foot swelling and pain became too much for her to tolerate, so she came to the ED for evaluation.  ED provider spoke with Dr. Due to and reviewed MRI findings and recommended IV antibiotics and outpatient follow-up. ?  ?The patient was admitted to a medical bed. She received IV Vancomycin and Cefepime, as well as pain control. The lower extremity gradually improved. She was discharged to home in fair condition on oral antibiotics. ? ? ?Assessment and Plan: ?Left lower extremity cellulitis: Improving.  Continue vancomycin and cefepime.  Blood cultures negative to date.  Lactic acidosis resolved.  MRI showed no abscess or osteomyelitis. The lower extremity looks much better. She will be discharged to home on oral antibiotics. ?  ?Serratia marcescens UTI: The patient has concluded the course of antibiotics for this infection. ?  ?Diabetes mellitus: Uncontrolled continue SSI, glucose  checks, DM diet, semglee, gabapentin. High glucose mid-day and this afternoon. Will add prandial insulin. ?  ?Chronic diastolic CHF, hypertension: Continue home amlodipine, carvedilol ?Acute decompensation ?  ?Migraines: Continue home Topamax ?  ?CAD, HLD: Continue home atorvastatin and aspirin ?  ?Depression/anxiety: Continue home Zoloft and buspiron ? ? ? ?Pt since discharge on Saturday she felt some sweats Saturday and Sunday but non yesterday. Pt was discharged on augmentin.  ? ? ?Pt was on semglee in the hospital and novolog. On review states had rash to novolog.  Pt a1c  7.9 9 months ago and at time of admission march 14 was 12.3.  ? ?Pt sugar yesterday was 150 yesterday and 130. Currently pt is only on Tresiba 40 units and metformin xr 500 one in am and one in pm. ? ?Tibia pain distal 1/3 region  with faint red rash.  ? ?Review of Systems  ?Constitutional:  Positive for diaphoresis. Negative for chills.  ?Respiratory:  Negative for cough, chest tightness, shortness of breath and wheezing.    ?Cardiovascular:  Negative for chest pain and palpitations.  ?Gastrointestinal:  Negative for abdominal pain.  ?Genitourinary:  Negative for dysuria.  ?Musculoskeletal:   ?     See HPI.  ?Neurological:  Negative for dizzin

## 2021-05-28 NOTE — Patient Instructions (Addendum)
Left lower extremity cellulitis with 2 hospitalizations.  Area that needed excisional debridement by Dr. Sharol Given and found to have necrotizing fasciitis.  On review I diagnosed with sepsis.  Presently appears clinically stable but describes 2 days since hospital discharge of feeling a little sweaty.  No sweating today.  Decided to get wound culture from the scabbed area where excisional debridement was done.  We will also get CBC and lactic acid stat.  In addition she has some medial tibia region no tenderness on left side so did get tibia x-ray today.  Will review results of the studies and then decide on management.  Will refer to Dr. Gale Journey in infectious disease.  On discharge summary noted patient will call for appointment.  Continue Augmentin presently. ? ?Providing Dr. Gale Journey ID MD number so you can call for follow up appt. [phone] ? ?Uncontrolled diabetes recently prior to hospital admission.  Now on Tresiba 40 units daily and metformin extended release 500 mg twice daily.  We will get metabolic panel today.  Asking patient to check her blood sugars at least twice daily and update me on those readings.  Yesterday she had sugar level of 150 and 130.  Since historically patient has not been well controlled decided to go ahead and refer patient to endocrinologist. ? ?Follow-up date to be determined after lab review. ?

## 2021-05-29 ENCOUNTER — Ambulatory Visit (INDEPENDENT_AMBULATORY_CARE_PROVIDER_SITE_OTHER): Payer: 59 | Admitting: Family

## 2021-05-29 ENCOUNTER — Encounter: Payer: Self-pay | Admitting: Family

## 2021-05-29 ENCOUNTER — Telehealth: Payer: Self-pay | Admitting: Family

## 2021-05-29 DIAGNOSIS — L02416 Cutaneous abscess of left lower limb: Secondary | ICD-10-CM

## 2021-05-29 LAB — LACTIC ACID, PLASMA: LACTIC ACID: 1.7 mmol/L (ref 0.4–1.8)

## 2021-05-29 NOTE — Telephone Encounter (Signed)
Pt had an post op today and need a post op 1 wki from now and pt wants to see PA Erin. Please open slot and call pt at [phone] ?

## 2021-05-29 NOTE — Progress Notes (Signed)
? ?Post-Op Visit Note ?  ?Patient: [name]           ?Date of Birth: 08/06/1965           ?MRN: [id number] ?Visit Date: 05/29/2021 ?PCP: Mackie Pai, PA-C ? ?Chief Complaint:  ?Chief Complaint  ?Patient presents with  ? Left Leg - Routine Post Op  ?  04/24/20 left leg deb ?05/01/21 left knee deb  ? ? ?HPI:  ?HPI ?The patient is a 56 year old woman who presents today in hospitalization follow-up she was hospitalized over the weekend for cellulitis to her operative leg.  She is status post left leg debridement x2 the distal surgery was done on March 15 the left knee surgery was done on March 22 she had I&D's for abscesses. ? ?There was concern for osteomyelitis of the left foot an MRI was performed on April 11 in the emergency department of the foot and ankle these were negative for osteomyelitis.  She completed a course of IV antibiotics and is currently taking Augmentin she states she has 2 more days ? ?She seems to be most concerned about continued edema to the left lower leg.  She has used compression garments but is distressed by the level of swelling that she has above the leg level of the compression stocking at the knee ?Ortho Exam ?On examination of the left lower extremity she does have moderate pitting edema of the left lower leg this is from the foot to the knee there is a well-healed surgical incision to the lateral left knee over her anterior compartment there is an area of eschar along her incision line the incision itself is healed this area of eschar is dry she states she does not have any drainage there is very mild surrounding erythema no concern for cellulitis this is quite tender for her ? ?Visit Diagnoses: No diagnosis found. ? ?Plan: We will trial a week in Dynaflex compression wraps.  Hope to see improvement in the edema and healing of her lower leg incision.  She will call for any concerns or worsening ? ?Follow-Up Instructions: Return in about 6 days (around 06/04/2021), or pm c  duda.  ? ?Imaging: ?No results found. ? ?Orders:  ?No orders of the defined types were placed in this encounter. ? ?No orders of the defined types were placed in this encounter. ? ? ? ?PMFS History: ?Patient Active Problem List  ? Diagnosis Date Noted  ? Sepsis ([place]) 05/21/2021  ? Chronic diastolic HF (heart failure) ([place]) 05/21/2021  ? Abscess of left lower leg   ? Necrotizing fasciitis ([place])   ? Cellulitis of left leg 04/22/2021  ? Sepsis due to cellulitis ([place]) 04/22/2021  ? Severe sepsis ([place]) 04/22/2021  ? Acute kidney injury superimposed on chronic kidney disease ([place]) 04/22/2021  ? Chest pain, unspecified 04/22/2021  ? Uncontrolled type 2 diabetes mellitus with hyperglycemia, without long-term current use of insulin ([place]) 04/22/2021  ? Pain of left calf 04/19/2021  ? Viral upper respiratory tract infection 11/30/2020  ? Trigeminal neuralgia   ? Cervical cancer ([place])   ? Chronic back pain   ? Coronary artery disease   ? GERD (gastroesophageal reflux disease)   ? Hyperlipidemia   ? Hypertension   ? Dyslipidemia 02/01/2019  ? Cardiac murmur 02/01/2019  ? Lumbar spinal stenosis 02/11/2018  ? Neuropathy 07/20/2017  ? Right flank pain   ? Nausea & vomiting 07/07/2017  ? Acute cystitis 07/07/2017  ? CAD (coronary artery disease) 07/07/2017  ? Metatarsalgia  of both feet 03/11/2017  ? Diabetic gastroparesis associated with type 1 diabetes mellitus ([place]) 11/27/2016  ? Pure hypercholesterolemia 11/27/2016  ? Lumbar facet arthropathy 05/14/2016  ? Chronic pain syndrome 01/23/2016  ? Diabetic peripheral neuropathy ([place]) 01/23/2016  ? Anxiety 01/22/2016  ? History of cervical cancer 08/17/2015  ? Depression 08/10/2015  ? Overweight (BMI 25.0-29.9) 07/21/2014  ? Chronic kidney disease, stage III (moderate) ([place]) 05/23/2014  ? Diabetes type 2, uncontrolled 05/23/2014  ? Current use of insulin ([place]) 05/23/2014  ? Vitamin D deficiency 04/25/2013  ? Cerebrovascular disease 09/13/2012  ? Migraine 09/13/2012  ? Nonspecific  abnormal findings on radiological and examination of skull and head 09/13/2012  ? Retention of urine 06/25/2011  ? ANKLE PAIN, LEFT 12/25/2009  ? DYSURIA 12/25/2009  ? VAGINITIS, CANDIDAL 12/25/2009  ? ULCER-GASTRIC 09/28/2009  ? DIABETIC PERIPHERAL NEUROPATHY 09/21/2009  ? TOBACCO ABUSE 09/21/2009  ? UTI (urinary tract infection) 09/21/2009  ? INSOMNIA 09/21/2009  ? TRANSAMINASES, SERUM, ELEVATED 09/21/2009  ? Elevation of level of transaminase or lactic acid dehydrogenase (LDH) 09/21/2009  ? Type 2 diabetes mellitus with stage 3 chronic kidney disease ([place]) 09/14/2009  ? Nausea with vomiting, unspecified 09/14/2009  ? ABDOMINAL PAIN-EPIGASTRIC 09/14/2009  ? Cardiomyopathy, secondary ([place]) 09/06/2009  ? Cardiomyopathy, unspecified ([place]) 09/06/2009  ? Essential hypertension 08/30/2009  ? DM2 (diabetes mellitus, type 2) ([place]) 07/30/2009  ? HLD (hyperlipidemia) 07/30/2009  ? GERD 07/30/2009  ? GASTROPARESIS 07/30/2009  ? CHEST PAIN, ATYPICAL 07/30/2009  ? POSITIVE PPD 07/30/2009  ? Type 2 diabetes mellitus with hyperglycemia ([place]) 07/30/2009  ? Gastric atony 07/30/2009  ? Gastroesophageal reflux disease 07/30/2009  ? Hyperlipidemia, unspecified 07/30/2009  ? Tuberculin test reaction 07/30/2009  ? ?Past Medical History:  ?Diagnosis Date  ? ABDOMINAL PAIN-EPIGASTRIC 09/14/2009  ? Qualifier: Diagnosis of  By: Shane Crutch, [name]   ? Acute cystitis 07/07/2017  ? ANKLE PAIN, LEFT 12/25/2009  ? Qualifier: Diagnosis of  By: Amil Amen MD, Benjamine Mola    ? Anxiety 01/22/2016  ? CAD (coronary artery disease) 07/07/2017  ? Cardiac murmur 02/01/2019  ? Cardiomyopathy, secondary ([place]) 09/06/2009  ? Qualifier: Diagnosis of  By: Arvid Right   Formatting of this note might be different from the original. Overview:  Qualifier: Diagnosis of  By: Arvid Right  ? Cardiomyopathy, unspecified ([place]) 09/06/2009  ? Formatting of this note might be different from the original. Overview:  Overview:  Qualifier:  Diagnosis of  By: Arvid Right  Overview:  Overview:  Qualifier: Diagnosis of  By: Arvid Right Formatting of this note might be different from the original. Overview:  Qualifier: Diagnosis of  By: Arvid Right  ? Cerebrovascular disease 09/13/2012  ? Cervical cancer ([place])   ? had surgery in 2001.  ? CHEST PAIN, ATYPICAL 07/30/2009  ? Qualifier: Diagnosis of  By: Amil Amen MD, Winona Legato of this note might be different from the original. Overview:  Qualifier: Diagnosis of  By: Amil Amen MD, Benjamine Mola  ? Chronic back pain   ? Chronic kidney disease, stage III (moderate) ([place]) 05/23/2014  ? Chronic pain syndrome 01/23/2016  ? Coronary artery disease   ? 30% lesions noted  ? Current use of insulin ([place]) 05/23/2014  ? Depression   ? Diabetes mellitus   ? DIABETES MELLITUS, TYPE II, UNCONTROLLED 07/30/2009  ? Qualifier: Diagnosis of  By: Amil Amen MD, Benjamine Mola    ? Diabetes type 2, uncontrolled 05/23/2014  ? Diabetic gastroparesis associated with  type 1 diabetes mellitus ([place]) 11/27/2016  ? DIABETIC PERIPHERAL NEUROPATHY 09/21/2009  ? Qualifier: Diagnosis of  By: Amil Amen MD, Benjamine Mola    ? Diabetic peripheral neuropathy ([place]) 01/23/2016  ? Dysuria 12/25/2009  ? Qualifier: Diagnosis of  By: Amil Amen MD, Benjamine Mola    ? Elevation of level of transaminase or lactic acid dehydrogenase (LDH) 09/21/2009  ? Formatting of this note might be different from the original. Overview:  Qualifier: Diagnosis of  By: Amil Amen MD, Benjamine Mola  ? Essential hypertension 08/30/2009  ? Qualifier: Diagnosis of  By: Lovette Cliche CNA, Christy    ? Gastric atony 07/30/2009  ? Formatting of this note might be different from the original. Overview:  Qualifier: Diagnosis of  By: Amil Amen MD, Benjamine Mola  ? Gastroesophageal reflux disease 07/30/2009  ? Formatting of this note might be different from the original. Overview:  Overview:  Qualifier: Diagnosis of  By: Amil Amen MD, Elby Showers: Diagnosis of   By: Chester Holstein NP, Ellwood Handler of this note might be different from the original. Overview:  Qualifier: Diagnosis of  By: Amil Amen MD, Elby Showers: Diagnosis of  By: Chester Holstein NP, Nevin Bloodgood  ? Gastropares

## 2021-05-30 ENCOUNTER — Telehealth: Payer: Self-pay | Admitting: Medical

## 2021-05-30 NOTE — Telephone Encounter (Signed)
Pt states she is needing medical clearance, stating that she can return to work with no restrictions. She stated her job does not have the paperwork, please advise.  ?

## 2021-05-30 NOTE — Addendum Note (Signed)
Addended by: Anabel Halon on: 05/30/2021 09:06 AM ? ? Modules accepted: Orders ? ?

## 2021-05-30 NOTE — Telephone Encounter (Signed)
Did pt give paperwork at time of visit ? ?

## 2021-05-31 ENCOUNTER — Ambulatory Visit: Payer: 59 | Admitting: Internal Medicine

## 2021-05-31 ENCOUNTER — Telehealth: Payer: Self-pay

## 2021-05-31 LAB — WOUND CULTURE
MICRO NUMBER:: 13278369
SPECIMEN QUALITY:: ADEQUATE

## 2021-05-31 NOTE — Telephone Encounter (Signed)
Called patient to reschedule missed appointment with Dr. Linus Salmons this afternoon. No answer and voicemail not set up. Unable to leave voice message. MyChart message sent.  ? ?Binnie Kand, RN  ?

## 2021-05-31 NOTE — Telephone Encounter (Signed)
Pt is scheduled on 06/04/21 with Erin ?

## 2021-05-31 NOTE — Telephone Encounter (Signed)
Pt called and unable to lvm 

## 2021-06-03 ENCOUNTER — Encounter: Payer: Self-pay | Admitting: Medical

## 2021-06-03 NOTE — Telephone Encounter (Signed)
Pt called back and informed we needed a form from her employer ?

## 2021-06-03 NOTE — Telephone Encounter (Signed)
Pt called and unable to lvm 

## 2021-06-04 ENCOUNTER — Encounter: Payer: Self-pay | Admitting: Family

## 2021-06-04 ENCOUNTER — Ambulatory Visit (INDEPENDENT_AMBULATORY_CARE_PROVIDER_SITE_OTHER): Payer: 59 | Admitting: Family

## 2021-06-04 DIAGNOSIS — L02416 Cutaneous abscess of left lower limb: Secondary | ICD-10-CM

## 2021-06-04 NOTE — Progress Notes (Signed)
? ?Post-Op Visit Note ?  ?Patient: [name]           ?Date of Birth: 08-Jul-1965           ?MRN: [id number] ?Visit Date: 06/04/2021 ?PCP: Mackie Pai, PA-C ? ?Chief Complaint:  ?Chief Complaint  ?Patient presents with  ? Left Knee - Routine Post Op  ?  05/01/21 left knee debridement   ? ? ?HPI:  ?HPI ?The patient is a 56 year old woman who presents today in hospitalization follow-up she was hospitalized over the weekend for cellulitis to her operative leg.  She is status post left leg debridement x2 the distal surgery was done on March 15 the left knee surgery was done on March 22 she had I&D's for abscesses. ? ?There was concern for osteomyelitis of the left foot an MRI was performed on April 11 in the emergency department of the foot and ankle these were negative for osteomyelitis.  She completed a course of IV antibiotics and is currently taking Augmentin she states she has 2 more days ? ?She seems to be most concerned about continued edema to the left lower leg.  She has used compression garments but is distressed by the level of swelling that she has above the leg level of the compression stocking at the knee ? ?Since last visit has been in a Dynaflex compression wrap.  She is quite pleased with the reduction in her swelling. ? ?Ortho Exam ?On examination of the left lower extremity she has good wrinkling of the skin and well-controlled edema.  Her area of eschar over her anterior and compartment is slowly healing.  There is no open area no drainage no erythema no warmth the more proximal incision is well-healed  ? ? ?Visit Diagnoses: No diagnosis found. ? ?Plan: Resume compression garments.  Daily Dial soap cleansing dry dressings.  She will call for any concerns or worsening ? ?Follow-Up Instructions: Return in about 3 weeks (around 06/25/2021).  ? ?Imaging: ?No results found. ? ?Orders:  ?No orders of the defined types were placed in this encounter. ? ?No orders of the defined types were placed  in this encounter. ? ? ? ?PMFS History: ?Patient Active Problem List  ? Diagnosis Date Noted  ? Sepsis ([place]) 05/21/2021  ? Chronic diastolic HF (heart failure) ([place]) 05/21/2021  ? Abscess of left lower leg   ? Necrotizing fasciitis ([place])   ? Cellulitis of left leg 04/22/2021  ? Sepsis due to cellulitis ([place]) 04/22/2021  ? Severe sepsis ([place]) 04/22/2021  ? Acute kidney injury superimposed on chronic kidney disease ([place]) 04/22/2021  ? Chest pain, unspecified 04/22/2021  ? Uncontrolled type 2 diabetes mellitus with hyperglycemia, without long-term current use of insulin ([place]) 04/22/2021  ? Pain of left calf 04/19/2021  ? Viral upper respiratory tract infection 11/30/2020  ? Trigeminal neuralgia   ? Cervical cancer ([place])   ? Chronic back pain   ? Coronary artery disease   ? GERD (gastroesophageal reflux disease)   ? Hyperlipidemia   ? Hypertension   ? Dyslipidemia 02/01/2019  ? Cardiac murmur 02/01/2019  ? Lumbar spinal stenosis 02/11/2018  ? Neuropathy 07/20/2017  ? Right flank pain   ? Nausea & vomiting 07/07/2017  ? Acute cystitis 07/07/2017  ? CAD (coronary artery disease) 07/07/2017  ? Metatarsalgia of both feet 03/11/2017  ? Diabetic gastroparesis associated with type 1 diabetes mellitus ([place]) 11/27/2016  ? Pure hypercholesterolemia 11/27/2016  ? Lumbar facet arthropathy 05/14/2016  ? Chronic pain syndrome 01/23/2016  ?  Diabetic peripheral neuropathy ([place]) 01/23/2016  ? Anxiety 01/22/2016  ? History of cervical cancer 08/17/2015  ? Depression 08/10/2015  ? Overweight (BMI 25.0-29.9) 07/21/2014  ? Chronic kidney disease, stage III (moderate) ([place]) 05/23/2014  ? Diabetes type 2, uncontrolled 05/23/2014  ? Current use of insulin ([place]) 05/23/2014  ? Vitamin D deficiency 04/25/2013  ? Cerebrovascular disease 09/13/2012  ? Migraine 09/13/2012  ? Nonspecific abnormal findings on radiological and examination of skull and head 09/13/2012  ? Retention of urine 06/25/2011  ? ANKLE PAIN, LEFT 12/25/2009  ? DYSURIA 12/25/2009   ? VAGINITIS, CANDIDAL 12/25/2009  ? ULCER-GASTRIC 09/28/2009  ? DIABETIC PERIPHERAL NEUROPATHY 09/21/2009  ? TOBACCO ABUSE 09/21/2009  ? UTI (urinary tract infection) 09/21/2009  ? INSOMNIA 09/21/2009  ? TRANSAMINASES, SERUM, ELEVATED 09/21/2009  ? Elevation of level of transaminase or lactic acid dehydrogenase (LDH) 09/21/2009  ? Type 2 diabetes mellitus with stage 3 chronic kidney disease ([place]) 09/14/2009  ? Nausea with vomiting, unspecified 09/14/2009  ? ABDOMINAL PAIN-EPIGASTRIC 09/14/2009  ? Cardiomyopathy, secondary ([place]) 09/06/2009  ? Cardiomyopathy, unspecified ([place]) 09/06/2009  ? Essential hypertension 08/30/2009  ? DM2 (diabetes mellitus, type 2) ([place]) 07/30/2009  ? HLD (hyperlipidemia) 07/30/2009  ? GERD 07/30/2009  ? GASTROPARESIS 07/30/2009  ? CHEST PAIN, ATYPICAL 07/30/2009  ? POSITIVE PPD 07/30/2009  ? Type 2 diabetes mellitus with hyperglycemia ([place]) 07/30/2009  ? Gastric atony 07/30/2009  ? Gastroesophageal reflux disease 07/30/2009  ? Hyperlipidemia, unspecified 07/30/2009  ? Tuberculin test reaction 07/30/2009  ? ?Past Medical History:  ?Diagnosis Date  ? ABDOMINAL PAIN-EPIGASTRIC 09/14/2009  ? Qualifier: Diagnosis of  By: Shane Crutch, [name]   ? Acute cystitis 07/07/2017  ? ANKLE PAIN, LEFT 12/25/2009  ? Qualifier: Diagnosis of  By: Amil Amen MD, Benjamine Mola    ? Anxiety 01/22/2016  ? CAD (coronary artery disease) 07/07/2017  ? Cardiac murmur 02/01/2019  ? Cardiomyopathy, secondary ([place]) 09/06/2009  ? Qualifier: Diagnosis of  By: Arvid Right   Formatting of this note might be different from the original. Overview:  Qualifier: Diagnosis of  By: Arvid Right  ? Cardiomyopathy, unspecified ([place]) 09/06/2009  ? Formatting of this note might be different from the original. Overview:  Overview:  Qualifier: Diagnosis of  By: Arvid Right  Overview:  Overview:  Qualifier: Diagnosis of  By: Arvid Right Formatting of this note might be  different from the original. Overview:  Qualifier: Diagnosis of  By: Arvid Right  ? Cerebrovascular disease 09/13/2012  ? Cervical cancer ([place])   ? had surgery in 2001.  ? CHEST PAIN, ATYPICAL 07/30/2009  ? Qualifier: Diagnosis of  By: Amil Amen MD, Winona Legato of this note might be different from the original. Overview:  Qualifier: Diagnosis of  By: Amil Amen MD, Benjamine Mola  ? Chronic back pain   ? Chronic kidney disease, stage III (moderate) ([place]) 05/23/2014  ? Chronic pain syndrome 01/23/2016  ? Coronary artery disease   ? 30% lesions noted  ? Current use of insulin ([place]) 05/23/2014  ? Depression   ? Diabetes mellitus   ? DIABETES MELLITUS, TYPE II, UNCONTROLLED 07/30/2009  ? Qualifier: Diagnosis of  By: Amil Amen MD, Benjamine Mola    ? Diabetes type 2, uncontrolled 05/23/2014  ? Diabetic gastroparesis associated with type 1 diabetes mellitus ([place]) 11/27/2016  ? DIABETIC PERIPHERAL NEUROPATHY 09/21/2009  ? Qualifier: Diagnosis of  By: Amil Amen MD, Benjamine Mola    ? Diabetic peripheral neuropathy ([place]) 01/23/2016  ? Dysuria 12/25/2009  ?  Qualifier: Diagnosis of  By: Amil Amen MD, Benjamine Mola    ? Elevation of level of transaminase or lactic acid dehydrogenase (LDH) 09/21/2009  ? Formatting of this note might be different from the original. Overview:  Qualifier: Diagnosis of  By: Amil Amen MD, Benjamine Mola  ? Essential hypertension 08/30/2009  ? Qualifier: Diagnosis of  By: Lovette Cliche CNA, Christy    ? Gastric atony 07/30/2009  ? Formatting of this note might be different from the original. Overview:  Qualifier: Diagnosis of  By: Amil Amen MD, Benjamine Mola  ? Gastroesophageal reflux disease 07/30/2009  ? Formatting of this note might be different from the original. Overview:  Overview:  Qualifier: Diagnosis of  By: Amil Amen MD, Elby Showers: Diagnosis of  By: Chester Holstein NP, Ellwood Handler of this note might be different from the original. Overview:  Qualifier: Diagnosis of  By: Amil Amen MD, Elby Showers:  Diagnosis of  By: Chester Holstein NP, Nevin Bloodgood  ? Gastroparesis 07/30/2009  ? Qualifier: Diagnosis of  By: Amil Amen MD, Benjamine Mola    ? GERD 07/30/2009  ? Qualifier: Diagnosis of  By: Amil Amen MD, Elby Showers: Dia

## 2021-06-06 ENCOUNTER — Telehealth: Payer: Self-pay | Admitting: Medical

## 2021-06-06 MED ORDER — AMLODIPINE BESYLATE 5 MG PO TABS: 5.0000 mg | ORAL_TABLET | Freq: Every day | ORAL | 3 refills | Status: DC

## 2021-06-06 NOTE — Telephone Encounter (Signed)
Pt called stating she needs a refill on her amLOPipine. Please Advise. ? ?Medication:  ? ?amLODipine (NORVASC) 5 MG tablet [[id number]]  ? ?Has the patient contacted their pharmacy? Yes.   ?(If no, request that the patient contact the pharmacy for the refill.) ?(If yes, when and what did the pharmacy advise?) Contact PCP ? ?Preferred Pharmacy (with phone number or street name):  ? ?[facility] ?[address], [place], [place] [address] ?Phone: [phone] ? ?Agent: Please be advised that RX refills may take up to 3 business days. We ask that you follow-up with your pharmacy. ? ?

## 2021-06-06 NOTE — Telephone Encounter (Signed)
Rx sent 

## 2021-06-12 ENCOUNTER — Encounter: Payer: 59 | Admitting: Family

## 2021-06-12 ENCOUNTER — Ambulatory Visit (INDEPENDENT_AMBULATORY_CARE_PROVIDER_SITE_OTHER): Payer: 59 | Admitting: Internal Medicine

## 2021-06-12 ENCOUNTER — Other Ambulatory Visit: Payer: Self-pay

## 2021-06-12 ENCOUNTER — Encounter: Payer: Self-pay | Admitting: Internal Medicine

## 2021-06-12 VITALS — BP 131/84 | HR 99 | Temp 97.9°F | Ht 72.0 in | Wt 181.0 lb

## 2021-06-12 DIAGNOSIS — L03116 Cellulitis of left lower limb: Secondary | ICD-10-CM

## 2021-06-12 DIAGNOSIS — L02416 Cutaneous abscess of left lower limb: Secondary | ICD-10-CM | POA: Diagnosis not present

## 2021-06-12 NOTE — Progress Notes (Signed)
?  ? ? ? ? ?[facility] ? ?Patient Active Problem List  ? Diagnosis Date Noted  ? Sepsis ([place]) 05/21/2021  ? Chronic diastolic HF (heart failure) ([place]) 05/21/2021  ? Abscess of left lower leg   ? Necrotizing fasciitis ([place])   ? Cellulitis of left leg 04/22/2021  ? Sepsis due to cellulitis ([place]) 04/22/2021  ? Severe sepsis ([place]) 04/22/2021  ? Acute kidney injury superimposed on chronic kidney disease ([place]) 04/22/2021  ? Chest pain, unspecified 04/22/2021  ? Uncontrolled type 2 diabetes mellitus with hyperglycemia, without long-term current use of insulin ([place]) 04/22/2021  ? Pain of left calf 04/19/2021  ? Viral upper respiratory tract infection 11/30/2020  ? Trigeminal neuralgia   ? Cervical cancer ([place])   ? Chronic back pain   ? Coronary artery disease   ? GERD (gastroesophageal reflux disease)   ? Hyperlipidemia   ? Hypertension   ? Dyslipidemia 02/01/2019  ? Cardiac murmur 02/01/2019  ? Lumbar spinal stenosis 02/11/2018  ? Neuropathy 07/20/2017  ? Right flank pain   ? Nausea & vomiting 07/07/2017  ? Acute cystitis 07/07/2017  ? CAD (coronary artery disease) 07/07/2017  ? Metatarsalgia of both feet 03/11/2017  ? Diabetic gastroparesis associated with type 1 diabetes mellitus ([place]) 11/27/2016  ? Pure hypercholesterolemia 11/27/2016  ? Lumbar facet arthropathy 05/14/2016  ? Chronic pain syndrome 01/23/2016  ? Diabetic peripheral neuropathy ([place]) 01/23/2016  ? Anxiety 01/22/2016  ? History of cervical cancer 08/17/2015  ? Depression 08/10/2015  ? Overweight (BMI 25.0-29.9) 07/21/2014  ? Chronic kidney disease, stage III (moderate) ([place]) 05/23/2014  ? Diabetes type 2, uncontrolled 05/23/2014  ? Current use of insulin ([place]) 05/23/2014  ? Vitamin D deficiency 04/25/2013  ? Cerebrovascular disease 09/13/2012  ? Migraine 09/13/2012  ? Nonspecific abnormal findings on radiological and examination of skull and head 09/13/2012  ? Retention of urine 06/25/2011  ? ANKLE PAIN, LEFT 12/25/2009  ?  DYSURIA 12/25/2009  ? VAGINITIS, CANDIDAL 12/25/2009  ? ULCER-GASTRIC 09/28/2009  ? DIABETIC PERIPHERAL NEUROPATHY 09/21/2009  ? TOBACCO ABUSE 09/21/2009  ? UTI (urinary tract infection) 09/21/2009  ? INSOMNIA 09/21/2009  ? TRANSAMINASES, SERUM, ELEVATED 09/21/2009  ? Elevation of level of transaminase or lactic acid dehydrogenase (LDH) 09/21/2009  ? Type 2 diabetes mellitus with stage 3 chronic kidney disease ([place]) 09/14/2009  ? Nausea with vomiting, unspecified 09/14/2009  ? ABDOMINAL PAIN-EPIGASTRIC 09/14/2009  ? Cardiomyopathy, secondary ([place]) 09/06/2009  ? Cardiomyopathy, unspecified ([place]) 09/06/2009  ? Essential hypertension 08/30/2009  ? DM2 (diabetes mellitus, type 2) ([place]) 07/30/2009  ? HLD (hyperlipidemia) 07/30/2009  ? GERD 07/30/2009  ? GASTROPARESIS 07/30/2009  ? CHEST PAIN, ATYPICAL 07/30/2009  ? POSITIVE PPD 07/30/2009  ? Type 2 diabetes mellitus with hyperglycemia ([place]) 07/30/2009  ? Gastric atony 07/30/2009  ? Gastroesophageal reflux disease 07/30/2009  ? Hyperlipidemia, unspecified 07/30/2009  ? Tuberculin test reaction 07/30/2009  ? ? ? ? ?Subjective:  ? ? Patient ID: [name], [name]    DOB: [birth year], 56 y.o.   MRN: [id number] ? ?Cc - hospital follow up of left LE abscess ? ?HPI: ? ?[name] is a 56 y.o. [name] here for hospital follow up skin/soft tissue infection/abscess LLE ? ?I saw here on 3/19 after initial I&D of LLE. She underwent another I&D on 3/22 for a new abscess that developed on abx. ?Cx from initial 3/15 showe gpc clusters but no growth ? ?She improved on vancomycin and I transitioned her to doxy/cefadroxil for another 7 days  from the second I&D ? ?---- ?5/3 id f/u ?Finished abx 2 weeks ago ?The lower leg incision is now scabbing over, still some swelling/edema in lle ?The lateral knee incision healed ?Feels well ? ?Asked about hyperpigmentation in her foot (no previous infection there) ? ? ? ?Allergies  ?Allergen Reactions  ? Insulin Aspart Other (See  Comments) and Swelling  ?  adverse reaction to Novolog ?  ? Latex Itching and Rash  ? Morphine Itching and Rash  ? ? ? ? ?Outpatient Medications Prior to Visit  ?Medication Sig Dispense Refill  ? albuterol (VENTOLIN HFA) 108 (90 Base) MCG/ACT inhaler Inhale 2 puffs into the lungs every 6 (six) hours as needed for wheezing or shortness of breath. 18 g 2  ? amLODipine (NORVASC) 5 MG tablet Take 1 tablet (5 mg total) by mouth daily. 90 tablet 3  ? aspirin 81 MG tablet Take 81 mg by mouth daily.    ? atorvastatin (LIPITOR) 80 MG tablet Take 1 tablet by mouth once daily (Patient taking differently: Take 80 mg by mouth daily.) 60 tablet 0  ? azelastine (ASTELIN) 0.1 % nasal spray Place 2 sprays into both nostrils 2 (two) times daily. 30 mL 1  ? busPIRone (BUSPAR) 15 MG tablet Take 1 tablet (15 mg total) by mouth 2 (two) times daily. (Patient taking differently: Take 15 mg by mouth 2 (two) times daily as needed (anxiety).) 60 tablet 3  ? carvedilol (COREG) 25 MG tablet Take 1 tablet (25 mg total) by mouth 2 (two) times daily with a meal. 180 tablet 0  ? cholecalciferol (VITAMIN D3) 10 MCG (400 UNIT) TABS tablet Take 1 tablet (400 Units total) by mouth daily. 90 tablet 0  ? famotidine (PEPCID) 20 MG tablet Take 1 tablet (20 mg total) by mouth daily. 30 tablet 0  ? fluticasone (FLONASE) 50 MCG/ACT nasal spray Place 2 sprays into both nostrils daily. 16 g 1  ? fluticasone furoate-vilanterol (BREO ELLIPTA) 100-25 MCG/INH AEPB Inhale 1 puff into the lungs daily. 100 each 2  ? gabapentin (NEURONTIN) 300 MG capsule Take 2 capsules (600 mg total) by mouth 3 (three) times daily. 270 capsule 0  ? insulin degludec (TRESIBA) 100 UNIT/ML FlexTouch Pen Inject 40 Units into the skin daily at 10 pm. (Patient taking differently: Inject 40 Units into the skin every evening.) 100 mL 2  ? meloxicam (MOBIC) 7.5 MG tablet Take 7.5 mg by mouth 2 (two) times daily.    ? metFORMIN (GLUCOPHAGE-XR) 500 MG 24 hr tablet Take 1 tablet (500 mg total) by  mouth daily with breakfast. (Patient taking differently: Take 500 mg by mouth 2 (two) times daily.) 60 tablet 2  ? methocarbamol (ROBAXIN) 750 MG tablet Take 1 tablet (750 mg total) by mouth every 8 (eight) hours as needed for muscle spasms. 30 tablet 0  ? nitroGLYCERIN (NITROSTAT) 0.4 MG SL tablet Place 1 tablet (0.4 mg total) under the tongue every 5 (five) minutes as needed for chest pain. 30 tablet 3  ? OxyCODONE HCl, Abuse Deter, (OXAYDO) 5 MG TABA Take 1 tablet by mouth 2 (two) times daily.    ? promethazine (PHENERGAN) 12.5 MG tablet Take 1 tablet (12.5 mg total) by mouth every 8 (eight) hours as needed. (Patient taking differently: Take 12.5 mg by mouth every 8 (eight) hours as needed for nausea or vomiting.) 30 tablet 0  ? sertraline (ZOLOFT) 100 MG tablet Take 1 tablet (100 mg total) by mouth daily. 30 tablet 3  ? topiramate (TOPAMAX)  50 MG tablet Take 1 tablet (50 mg total) by mouth 2 (two) times daily. (Patient taking differently: Take 50 mg by mouth 2 (two) times daily as needed (headache).) 60 tablet 12  ? ?No facility-administered medications prior to visit.  ? ? ? ?Social History  ? ?Socioeconomic History  ? Marital status: Single  ?  Spouse name: Not on file  ? Number of children: Not on file  ? Years of education: Not on file  ? Highest education level: Not on file  ?Occupational History  ? Not on file  ?Tobacco Use  ? Smoking status: Never  ? Smokeless tobacco: Never  ?Vaping Use  ? Vaping Use: Never used  ?Substance and Sexual Activity  ? Alcohol use: Yes  ?  Comment: occasionally  ? Drug use: No  ? Sexual activity: Not on file  ?Other Topics Concern  ? Not on file  ?Social History Narrative  ? Not on file  ? ?Social Determinants of Health  ? ?Financial Resource Strain: Not on file  ?Food Insecurity: Not on file  ?Transportation Needs: Not on file  ?Physical Activity: Not on file  ?Stress: Not on file  ?Social Connections: Not on file  ?Intimate Partner Violence: Not on file  ? ? ? ? ?Review of  Systems ?   ?All other ros negative ? ?Objective:  ?  ?BP 131/84   Pulse 99   Temp 97.9 ?F (36.6 ?C) (Temporal)   Ht 6' (1.829 m)   Wt 181 lb (82.1 kg)   BMI 24.55 kg/m?  ?Nursing note and vital signs reviewed. ?

## 2021-06-12 NOTE — Patient Instructions (Signed)
Your infection appear treated ? ?There is still some skin healing and edema but no active sign of infection especially with continued improvement 2 weeks off antibiotics ? ? ?Continue to monitor for focal persistent worsening pain, swelling, ulceration/redness, fluctuance if occur let me know ? ? ?No need to schedule any appointment at this time ?

## 2021-06-14 ENCOUNTER — Ambulatory Visit (HOSPITAL_BASED_OUTPATIENT_CLINIC_OR_DEPARTMENT_OTHER)
Admission: RE | Admit: 2021-06-14 | Discharge: 2021-06-14 | Disposition: A | Discharge status: Home / Self Care | Payer: 59 | Source: Ambulatory Visit | Attending: Medical | Admitting: Medical

## 2021-06-14 ENCOUNTER — Ambulatory Visit: Payer: 59 | Admitting: Medical

## 2021-06-14 ENCOUNTER — Other Ambulatory Visit (HOSPITAL_BASED_OUTPATIENT_CLINIC_OR_DEPARTMENT_OTHER): Payer: Self-pay

## 2021-06-14 ENCOUNTER — Ambulatory Visit (INDEPENDENT_AMBULATORY_CARE_PROVIDER_SITE_OTHER): Payer: 59 | Admitting: Medical

## 2021-06-14 ENCOUNTER — Encounter: Payer: Self-pay | Admitting: Medical

## 2021-06-14 VITALS — BP 140/90 | HR 89 | Resp 18 | Ht 72.0 in | Wt 180.0 lb

## 2021-06-14 DIAGNOSIS — M79671 Pain in right foot: Secondary | ICD-10-CM

## 2021-06-14 DIAGNOSIS — L089 Local infection of the skin and subcutaneous tissue, unspecified: Secondary | ICD-10-CM | POA: Diagnosis not present

## 2021-06-14 DIAGNOSIS — E1165 Type 2 diabetes mellitus with hyperglycemia: Secondary | ICD-10-CM | POA: Diagnosis not present

## 2021-06-14 DIAGNOSIS — M79672 Pain in left foot: Secondary | ICD-10-CM | POA: Diagnosis present

## 2021-06-14 DIAGNOSIS — L98491 Non-pressure chronic ulcer of skin of other sites limited to breakdown of skin: Secondary | ICD-10-CM | POA: Diagnosis not present

## 2021-06-14 DIAGNOSIS — Z794 Long term (current) use of insulin: Secondary | ICD-10-CM

## 2021-06-14 MED ORDER — INSULIN GLARGINE-YFGN 100 UNIT/ML ~~LOC~~ SOPN
40.0000 [IU] | PEN_INJECTOR | Freq: Every day | SUBCUTANEOUS | 11 refills | Status: DC
  Filled 2021-06-14 – 2021-08-30 (×2): qty 15, 37d supply, fill #0

## 2021-06-14 MED ORDER — CEFTRIAXONE SODIUM 1 G IJ SOLR
1.0000 g | Freq: Once | INTRAMUSCULAR | Status: AC
  Administered 2021-06-14: 1 g via INTRAMUSCULAR

## 2021-06-14 MED ORDER — SULFAMETHOXAZOLE-TRIMETHOPRIM 800-160 MG PO TABS
1.0000 | ORAL_TABLET | Freq: Two times a day (BID) | ORAL | 0 refills | Status: DC
Start: 2021-06-14 — End: 2021-07-26
  Filled 2021-06-14: qty 20, 10d supply, fill #0

## 2021-06-14 NOTE — Patient Instructions (Signed)
Recent new right lower extremity/right second toe ulcer.  Also left foot pain with callus and distal metatarsal area and foot pain on top aspect of foot/metatarsal region.  Getting right second toe wound culture today.  Also getting CBC and lactic acid stat.  In addition getting bilateral feet x-rays stat.  Concern for developing these infections.  Recently hospitalized due to sepsis from left lower extremity/pretibial area infection.  On that hospitalization needed procedure by Dr. Sharol Given. ? ?Rocephin 1 g IM injection today and will use Bactrim DS oral antibiotic. ? ?Diabetes with recent poor control since running out of [place].  Continue metformin and discussed with pharmacy downstairs replacement insulin since [place] cost in excess of $500 recently.  Wrote for [facility] to use of 40 units at night.  Check sugars daily.  If sugars not coming down might need to consider adding mealtime insulin.  If with basal insulin and metformin sugars exceeding 400 then be seen in the emergency department. ? ?Follow-up appointment with me in 5 days with me if we can't get you in with Dr. Sharol Given before then. ? ?In the event of fevers, chills, sweats, worsening foot appearance be seen in the emergency department. ?

## 2021-06-14 NOTE — Progress Notes (Signed)
? ?Subjective:  ? ? Patient ID: [name], female    DOB: 1965-05-04, 56 y.o.   MRN: [id number] ? ?HPI ? ?Pt has rt second toe medial aspect break down that occurred 2 days ago. Pt also states her left foot metatarsal has callous and top of foot also hurts..  ? ?Pt saw Dr. Gale Journey ID MD on Tuesday. She did not show him rt 2nd toe. Pt states he checked more proximal let pretibial area where prior area of infection was. ? ?Pt has seen Dr. Sharol Given in hospital.  ? ?No fever, no chills or sweats. She states in past stay on cold side. ? ?Pt was on augmentin most recently about 3 weeks ago. ? ? ?Pt is diabetic. Pt states her sugars have been more than 200. She ran out tresiba 2 days ago. She also is on metformin.  ? ?Pt states tresiba  cost 534 dollars.  ? ?While in hospital pt was on sliding scale novolog. ? ? ?Review of Systems  ?Constitutional:  Negative for chills and fever.  ?Respiratory:  Negative for chest tightness, shortness of breath and wheezing.   ?Cardiovascular:  Negative for chest pain and palpitations.  ?Gastrointestinal:  Negative for abdominal pain.  ?Musculoskeletal:  Negative for back pain, joint swelling, myalgias and neck stiffness.  ?     Feet pain  ?Skin:  Positive for color change. Negative for rash.  ?     See hpi.  ? ?Past Medical History:  ?Diagnosis Date  ? ABDOMINAL PAIN-EPIGASTRIC 09/14/2009  ? Qualifier: Diagnosis of  By: Shane Crutch, [name]   ? Acute cystitis 07/07/2017  ? ANKLE PAIN, LEFT 12/25/2009  ? Qualifier: Diagnosis of  By: Amil Amen MD, Benjamine Mola    ? Anxiety 01/22/2016  ? CAD (coronary artery disease) 07/07/2017  ? Cardiac murmur 02/01/2019  ? Cardiomyopathy, secondary ([place]) 09/06/2009  ? Qualifier: Diagnosis of  By: Arvid Right   Formatting of this note might be different from the original. Overview:  Qualifier: Diagnosis of  By: Arvid Right  ? Cardiomyopathy, unspecified ([place]) 09/06/2009  ? Formatting of this note might be different from the  original. Overview:  Overview:  Qualifier: Diagnosis of  By: Arvid Right  Overview:  Overview:  Qualifier: Diagnosis of  By: Arvid Right Formatting of this note might be different from the original. Overview:  Qualifier: Diagnosis of  By: Arvid Right  ? Cerebrovascular disease 09/13/2012  ? Cervical cancer ([place])   ? had surgery in 2001.  ? CHEST PAIN, ATYPICAL 07/30/2009  ? Qualifier: Diagnosis of  By: Amil Amen MD, Winona Legato of this note might be different from the original. Overview:  Qualifier: Diagnosis of  By: Amil Amen MD, Benjamine Mola  ? Chronic back pain   ? Chronic kidney disease, stage III (moderate) ([place]) 05/23/2014  ? Chronic pain syndrome 01/23/2016  ? Coronary artery disease   ? 30% lesions noted  ? Current use of insulin ([place]) 05/23/2014  ? Depression   ? Diabetes mellitus   ? DIABETES MELLITUS, TYPE II, UNCONTROLLED 07/30/2009  ? Qualifier: Diagnosis of  By: Amil Amen MD, Benjamine Mola    ? Diabetes type 2, uncontrolled 05/23/2014  ? Diabetic gastroparesis associated with type 1 diabetes mellitus ([place]) 11/27/2016  ? DIABETIC PERIPHERAL NEUROPATHY 09/21/2009  ? Qualifier: Diagnosis of  By: Amil Amen MD, Benjamine Mola    ? Diabetic peripheral neuropathy ([place]) 01/23/2016  ? Dysuria 12/25/2009  ? Qualifier: Diagnosis of  ByAmil Amen MD, Benjamine Mola    ? Elevation of level of transaminase or lactic acid dehydrogenase (LDH) 09/21/2009  ? Formatting of this note might be different from the original. Overview:  Qualifier: Diagnosis of  By: Amil Amen MD, Benjamine Mola  ? Essential hypertension 08/30/2009  ? Qualifier: Diagnosis of  By: Lovette Cliche CNA, Christy    ? Gastric atony 07/30/2009  ? Formatting of this note might be different from the original. Overview:  Qualifier: Diagnosis of  By: Amil Amen MD, Benjamine Mola  ? Gastroesophageal reflux disease 07/30/2009  ? Formatting of this note might be different from the original. Overview:  Overview:  Qualifier: Diagnosis of  By:  Amil Amen MD, Elby Showers: Diagnosis of  By: Chester Holstein NP, Ellwood Handler of this note might be different from the original. Overview:  Qualifier: Diagnosis of  By: Amil Amen MD, Elby Showers: Diagnosis of  By: Chester Holstein NP, Nevin Bloodgood  ? Gastroparesis 07/30/2009  ? Qualifier: Diagnosis of  By: Amil Amen MD, Benjamine Mola    ? GERD 07/30/2009  ? Qualifier: Diagnosis of  By: Amil Amen MD, Elby Showers: Diagnosis of  By: Chester Holstein NP, Nevin Bloodgood    ? GERD (gastroesophageal reflux disease)   ? History of cervical cancer 08/17/2015  ? Status post partial hysterectomy  Formatting of this note might be different from the original. Status post partial hysterectomy  ? HLD (hyperlipidemia) 07/30/2009  ? Qualifier: Diagnosis of  By: Amil Amen MD, Benjamine Mola    ? Hyperlipidemia   ? Hyperlipidemia, unspecified 07/30/2009  ? Formatting of this note might be different from the original. Overview:  Qualifier: Diagnosis of  By: Amil Amen MD, Winona Legato of this note might be different from the original. Overview:  Qualifier: Diagnosis of  By: Amil Amen MD, Benjamine Mola  ? Hypertension   ? INSOMNIA 09/21/2009  ? Qualifier: Diagnosis of  By: Amil Amen MD, Benjamine Mola    ? Lumbar facet arthropathy 05/14/2016  ? Lumbar spinal stenosis 02/11/2018  ? Metatarsalgia of both feet 03/11/2017  ? Migraine 09/13/2012  ? Overview:  IMPRESSION: possible abd migraine  ? Mixed dyslipidemia 02/01/2019  ? Nausea & vomiting 07/07/2017  ? Nausea with vomiting, unspecified 09/14/2009  ? Qualifier: Diagnosis of  By: Shane Crutch, [name]   Formatting of this note might be different from the original. Overview:  Qualifier: Diagnosis of  By: Shane Crutch, [name]  ? Neuropathy   ? Nonspecific abnormal findings on radiological and examination of skull and head 09/13/2012  ? Overweight (BMI 25.0-29.9) 07/21/2014  ? POSITIVE PPD 07/30/2009  ? Annotation: CXR clear ?  diagnosed in 2/11?  On INH/pyridoxine Qualifier: Diagnosis of  By: Amil Amen MD, Benjamine Mola    ? Pure  hypercholesterolemia 11/27/2016  ? Retention of urine 06/25/2011  ? Right flank pain   ? TOBACCO ABUSE 09/21/2009  ? Qualifier: Diagnosis of  By: Amil Amen MD, Winona Legato of this note might be different from the original. Overview:  Qualifier: Diagnosis of  By: Amil Amen MD, Benjamine Mola  ? TRANSAMINASES, SERUM, ELEVATED 09/21/2009  ? Qualifier: Diagnosis of  By: Amil Amen MD, Benjamine Mola    ? Trigeminal neuralgia   ? Tuberculin test reaction 07/30/2009  ? Formatting of this note might be different from the original. Overview:  Annotation: CXR clear ?  diagnosed in 2/11?  On INH/pyridoxine Qualifier: Diagnosis of  By: Amil Amen MD, Benjamine Mola  ? Type 2 diabetes mellitus with hyperglycemia ([place]) 07/30/2009  ? Qualifier: Diagnosis of  By: Amil Amen MD, Winona Legato of this note might  be different from the original. Overview:  Qualifier: Diagnosis of  By: Amil Amen MD, Benjamine Mola  ? Type 2 diabetes mellitus with hyperglycemia, with long-term current use of insulin ([place]) 07/30/2009  ? Formatting of this note might be different from the original. Overview:  05/30/2015 A1C was 16.7% (!)  Overview:  05/30/2015 A1C was 16.7% Kingsley Callander of this note might be different from the original. 05/30/2015 A1C was 16.7% (!)  ? Type 2 diabetes mellitus with stage 3 chronic kidney disease ([place]) 09/14/2009  ? Qualifier: Diagnosis of  By: Shane Crutch, [name]   ? ULCER-GASTRIC 09/28/2009  ? Qualifier: Diagnosis of  By: Chester Holstein NP, Nevin Bloodgood    ? URINARY TRACT INFECTION 09/21/2009  ? Qualifier: Diagnosis of  By: Amil Amen MD, Benjamine Mola    ? VAGINITIS, CANDIDAL 12/25/2009  ? Qualifier: Diagnosis of  By: Amil Amen MD, Benjamine Mola    ? Vitamin D deficiency 04/25/2013  ? ?  ?Social History  ? ?Socioeconomic History  ? Marital status: Single  ?  Spouse name: Not on file  ? Number of children: Not on file  ? Years of education: Not on file  ? Highest education level: Not on file  ?Occupational History  ? Not on file  ?Tobacco Use  ? Smoking status:  Never  ? Smokeless tobacco: Never  ?Vaping Use  ? Vaping Use: Never used  ?Substance and Sexual Activity  ? Alcohol use: Yes  ?  Comment: occasionally  ? Drug use: No  ? Sexual activity: Not on file  ?Other Topics Conce

## 2021-06-15 NOTE — Addendum Note (Signed)
Addended by: Anabel Halon on: 06/15/2021 02:14 PM ? ? Modules accepted: Orders ? ?

## 2021-06-17 LAB — CBC WITH DIFFERENTIAL/PLATELET
Absolute Monocytes: 465 cells/uL (ref 200–950)
Basophils Absolute: 32 cells/uL (ref 0–200)
Basophils Relative: 0.7 %
Eosinophils Absolute: 354 cells/uL (ref 15–500)
Eosinophils Relative: 7.7 %
HCT: 40.3 % (ref 35.0–45.0)
Hemoglobin: 12.9 g/dL (ref 11.7–15.5)
Lymphs Abs: 2245 cells/uL (ref 850–3900)
MCH: 29.8 pg (ref 27.0–33.0)
MCHC: 32 g/dL (ref 32.0–36.0)
MCV: 93.1 fL (ref 80.0–100.0)
MPV: 10.3 fL (ref 7.5–12.5)
Monocytes Relative: 10.1 %
Neutro Abs: 1504 cells/uL (ref 1500–7800)
Neutrophils Relative %: 32.7 %
Platelets: 327 10*3/uL (ref 140–400)
RBC: 4.33 10*6/uL (ref 3.80–5.10)
RDW: 14.7 % (ref 11.0–15.0)
Total Lymphocyte: 48.8 %
WBC: 4.6 10*3/uL (ref 3.8–10.8)

## 2021-06-17 LAB — WOUND CULTURE
MICRO NUMBER:: 13357641
SPECIMEN QUALITY:: ADEQUATE

## 2021-06-17 LAB — LACTIC ACID, PLASMA: LACTIC ACID: 1 mmol/L (ref 0.4–1.8)

## 2021-06-18 ENCOUNTER — Ambulatory Visit (INDEPENDENT_AMBULATORY_CARE_PROVIDER_SITE_OTHER): Payer: 59 | Admitting: Family

## 2021-06-18 ENCOUNTER — Encounter: Payer: Self-pay | Admitting: Family

## 2021-06-18 DIAGNOSIS — L97511 Non-pressure chronic ulcer of other part of right foot limited to breakdown of skin: Secondary | ICD-10-CM

## 2021-06-18 DIAGNOSIS — L02416 Cutaneous abscess of left lower limb: Secondary | ICD-10-CM | POA: Diagnosis not present

## 2021-06-18 DIAGNOSIS — M7742 Metatarsalgia, left foot: Secondary | ICD-10-CM | POA: Diagnosis not present

## 2021-06-18 NOTE — Progress Notes (Signed)
? ?Office Visit Note ?  ?Patient: [name]           ?Date of Birth: [birth year]           ?MRN: [id number] ?Visit Date: 06/18/2021 ?             ?Requested by: Mackie Pai, PA-C ?[address] ?[address] ?[place],  [place] [id number] ?PCP: Mackie Pai, PA-C ? ?No chief complaint on file. ? ? ? ? ?HPI: ?The patient is a 56 year old woman who presents today in postoperative follow-up as well as for a new issue. ? ?She is status post left knee and left calf debridement for abscess March 22.  She has been doing well she has been continuing to wear her compression garments and doing Dial soap cleansing. ? ?She unfortunately did develop a ulcer in the first webspace over the second toe.  She is unsure how this occurred she was seen by her primary care and started on antibiotics for a skin infection wound cultures were performed she also had a CBC and a lactic acid drawn these were all within normal limits.  Radiographs of both feet were unrevealing. ? ?The incisions on the left leg are well-healed unfortunately she has noticed over the last 5 days increasing swelling over her anterior ankle on the left with warmth she is fears there is fluid inside.  She is also currently struggling with ulcer beneath the second metatarsal head same foot this has been ongoing for years.  She has had previous claw toe surgery on the same foot currently the ulcer and callus has broken down this is increased in size and beginning to drain ? ?Denies fevers or chills ? ?Assessment & Plan: ?Visit Diagnoses: No diagnosis found. ? ?Plan: Aspiration of the left anterior ankle abscess.  This drained serosanguinous fluid.  We will send fluid for culture.  She will continue on her oral antibiotics, Bactrim.  As for the long second metatarsal tarsal head discussed with Dr. Sharol Given he would like to proceed with Weil osteotomy of the second metatarsal. ? ?For the wound on the right foot continue with daily dose of cleansing.  May use  antibacterial ointment and a dressing feel this will is healed unremarkably now that her nails have been trimmed. ? ?Follow-Up Instructions: No follow-ups on file.  ? ?Ortho Exam ? ?Patient is alert, oriented, no adenopathy, well-dressed, normal affect, normal respiratory effort. ?On examination of the right foot she has no edema no erythema she does have ulcer in the webspace and over the medial aspect of her second toe this appears to be from rubbing over a thickened and overgrown toenail at her great toe.  There is crossover deformity.  Nail was trimmed after her informed consent antibacterial ointment and dressing placed ?On examination of the left lower extremity she has well-healed surgical scars over the knee and calf there is no erythema no edema over the anterior ankle she does have an area of fluctuance with mild erythema this was aspirated today patient consented.  There was serosanguineous drainage about 2 cc.  Site of a sending cellulitis. ? ?Beneath the second metatarsal head she does have a half dollar sized callused ulceration this was debrided with a 10 blade knife back to viable tissue again with serosanguineous drainage no surrounding erythema there is tenderness no warmth ? ?Imaging: ?No results found. ?No images are attached to the encounter. ? ?Labs: ?Lab Results  ?Component Value Date  ? HGBA1C 12.3 (H) 04/23/2021  ?  HGBA1C 7.9 (H) 08/24/2020  ? HGBA1C 7.7 (H) 07/09/2017  ? ESRSEDRATE 41 (H) 08/24/2020  ? REPTSTATUS 05/23/2021 FINAL 05/21/2021  ? GRAMSTAIN  05/01/2021  ?  FEW WBC PRESENT,BOTH PMN AND MONONUCLEAR ?NO ORGANISMS SEEN ?  ? CULT >=100,000 COLONIES/mL SERRATIA MARCESCENS (A) 05/21/2021  ? LABORGA SERRATIA MARCESCENS (A) 05/21/2021  ? ? ? ?Lab Results  ?Component Value Date  ? ALBUMIN 3.9 05/28/2021  ? ALBUMIN 3.1 (L) 05/22/2021  ? ALBUMIN 3.6 05/21/2021  ? ? ?Lab Results  ?Component Value Date  ? MG 1.8 07/11/2017  ? ?No results found for: VD25OH ? ?No results found for:  PREALBUMIN ? ?  Latest Ref Rng & Units 06/14/2021  ? 11:39 AM 05/28/2021  ? 12:55 PM 05/25/2021  ?  5:07 AM  ?CBC EXTENDED  ?WBC 3.8 - 10.8 Thousand/uL 4.6   7.0   5.9    ?RBC 3.80 - 5.10 Million/uL 4.33   3.79   3.51    ?Hemoglobin 11.7 - 15.5 g/dL 12.9   11.2   10.3    ?HCT 35.0 - 45.0 % 40.3   34.3   32.3    ?Platelets 140 - 400 Thousand/uL 327   278.0   232    ?NEUT# 1,500 - 7,800 cells/uL 1,504   3.1   2.1    ?Lymph# 850 - 3,900 cells/uL 2,245   2.7   2.5    ? ? ? ?There is no height or weight on file to calculate BMI. ? ?Orders:  ?No orders of the defined types were placed in this encounter. ? ?No orders of the defined types were placed in this encounter. ? ? ? Procedures: ?No procedures performed ? ?Clinical Data: ?No additional findings. ? ?ROS: ? ?All other systems negative, except as noted in the HPI. ?Review of Systems ? ?Objective: ?Vital Signs: There were no vitals taken for this visit. ? ?Specialty Comments:  ?No specialty comments available. ? ?PMFS History: ?Patient Active Problem List  ? Diagnosis Date Noted  ? Sepsis ([place]) 05/21/2021  ? Chronic diastolic HF (heart failure) ([place]) 05/21/2021  ? Abscess of left lower leg   ? Necrotizing fasciitis ([place])   ? Cellulitis of left leg 04/22/2021  ? Sepsis due to cellulitis ([place]) 04/22/2021  ? Severe sepsis ([place]) 04/22/2021  ? Acute kidney injury superimposed on chronic kidney disease ([place]) 04/22/2021  ? Chest pain, unspecified 04/22/2021  ? Uncontrolled type 2 diabetes mellitus with hyperglycemia, without long-term current use of insulin ([place]) 04/22/2021  ? Pain of left calf 04/19/2021  ? Viral upper respiratory tract infection 11/30/2020  ? Trigeminal neuralgia   ? Cervical cancer ([place])   ? Chronic back pain   ? Coronary artery disease   ? GERD (gastroesophageal reflux disease)   ? Hyperlipidemia   ? Hypertension   ? Dyslipidemia 02/01/2019  ? Cardiac murmur 02/01/2019  ? Lumbar spinal stenosis 02/11/2018  ? Neuropathy 07/20/2017  ? Right flank pain   ? Nausea  & vomiting 07/07/2017  ? Acute cystitis 07/07/2017  ? CAD (coronary artery disease) 07/07/2017  ? Metatarsalgia of both feet 03/11/2017  ? Diabetic gastroparesis associated with type 1 diabetes mellitus ([place]) 11/27/2016  ? Pure hypercholesterolemia 11/27/2016  ? Lumbar facet arthropathy 05/14/2016  ? Chronic pain syndrome 01/23/2016  ? Diabetic peripheral neuropathy ([place]) 01/23/2016  ? Anxiety 01/22/2016  ? History of cervical cancer 08/17/2015  ? Depression 08/10/2015  ? Overweight (BMI 25.0-29.9) 07/21/2014  ? Chronic kidney disease, stage III (moderate) ([place]) 05/23/2014  ?  Diabetes type 2, uncontrolled 05/23/2014  ? Current use of insulin ([place]) 05/23/2014  ? Vitamin D deficiency 04/25/2013  ? Cerebrovascular disease 09/13/2012  ? Migraine 09/13/2012  ? Nonspecific abnormal findings on radiological and examination of skull and head 09/13/2012  ? Retention of urine 06/25/2011  ? ANKLE PAIN, LEFT 12/25/2009  ? DYSURIA 12/25/2009  ? VAGINITIS, CANDIDAL 12/25/2009  ? ULCER-GASTRIC 09/28/2009  ? DIABETIC PERIPHERAL NEUROPATHY 09/21/2009  ? TOBACCO ABUSE 09/21/2009  ? UTI (urinary tract infection) 09/21/2009  ? INSOMNIA 09/21/2009  ? TRANSAMINASES, SERUM, ELEVATED 09/21/2009  ? Elevation of level of transaminase or lactic acid dehydrogenase (LDH) 09/21/2009  ? Type 2 diabetes mellitus with stage 3 chronic kidney disease ([place]) 09/14/2009  ? Nausea with vomiting, unspecified 09/14/2009  ? ABDOMINAL PAIN-EPIGASTRIC 09/14/2009  ? Cardiomyopathy, secondary ([place]) 09/06/2009  ? Cardiomyopathy, unspecified ([place]) 09/06/2009  ? Essential hypertension 08/30/2009  ? DM2 (diabetes mellitus, type 2) ([place]) 07/30/2009  ? HLD (hyperlipidemia) 07/30/2009  ? GERD 07/30/2009  ? GASTROPARESIS 07/30/2009  ? CHEST PAIN, ATYPICAL 07/30/2009  ? POSITIVE PPD 07/30/2009  ? Type 2 diabetes mellitus with hyperglycemia ([place]) 07/30/2009  ? Gastric atony 07/30/2009  ? Gastroesophageal reflux disease 07/30/2009  ? Hyperlipidemia, unspecified 07/30/2009   ? Tuberculin test reaction 07/30/2009  ? ?Past Medical History:  ?Diagnosis Date  ? ABDOMINAL PAIN-EPIGASTRIC 09/14/2009  ? Qualifier: Diagnosis of  By: Shane Crutch, [name]   ? Acute cystitis 07/07/2017  ? ANKLE PA

## 2021-06-19 ENCOUNTER — Ambulatory Visit: Payer: 59 | Admitting: Medical

## 2021-06-21 ENCOUNTER — Other Ambulatory Visit (HOSPITAL_BASED_OUTPATIENT_CLINIC_OR_DEPARTMENT_OTHER): Payer: Self-pay

## 2021-06-26 ENCOUNTER — Encounter: Admission: RE | Payer: Self-pay | Source: Home / Self Care

## 2021-06-26 ENCOUNTER — Ambulatory Visit: Admission: RE | Admit: 2021-06-26 | Payer: 59 | Source: Home / Self Care | Admitting: Orthopedic Surgery

## 2021-06-26 SURGERY — OSTEOTOMY, WEIL
Anesthesia: Choice | Laterality: Left

## 2021-07-23 ENCOUNTER — Other Ambulatory Visit (HOSPITAL_COMMUNITY): Payer: Self-pay

## 2021-07-23 ENCOUNTER — Telehealth: Payer: Self-pay | Admitting: Medical

## 2021-07-23 ENCOUNTER — Other Ambulatory Visit: Payer: Self-pay | Admitting: Medical

## 2021-07-23 MED ORDER — CARVEDILOL 25 MG PO TABS
25.0000 mg | ORAL_TABLET | Freq: Two times a day (BID) | ORAL | 0 refills | Status: DC
Start: 2021-07-23 — End: 2022-01-08
  Filled 2021-07-23: qty 180, 90d supply, fill #0

## 2021-07-23 MED ORDER — METHOCARBAMOL 750 MG PO TABS
750.0000 mg | ORAL_TABLET | Freq: Three times a day (TID) | ORAL | 0 refills | Status: DC | PRN
  Filled 2021-07-23: qty 30, 10d supply, fill #0

## 2021-07-23 MED ORDER — ATORVASTATIN CALCIUM 80 MG PO TABS
80.0000 mg | ORAL_TABLET | Freq: Every day | ORAL | 0 refills | Status: DC
  Filled 2021-07-23: qty 90, 90d supply, fill #0

## 2021-07-23 NOTE — Telephone Encounter (Signed)
Requesting: oxycodone '5mg'$   Contract: 08/03/20 UDS: 10/30/20  Last Visit: 06/14/21 Next Visit: None Last Refill: 03/21/21 #30 and 0RF  Please Advise

## 2021-07-24 ENCOUNTER — Other Ambulatory Visit (HOSPITAL_COMMUNITY): Payer: Self-pay

## 2021-07-24 MED ORDER — OXYCODONE HCL 5 MG PO TABA
ORAL_TABLET | ORAL | 0 refills | Status: DC
  Filled 2021-07-24: qty 10, 5d supply, fill #0

## 2021-07-24 NOTE — Telephone Encounter (Signed)
Got refill request for oxycodone. Sent in 10 tab rx refill. I have not seen in her recently for pain. She needs controlled med visit. If you schedule her for 40 minute slot if possible. Or have break in schedule following her visit. She is very complicated has multiple medical problems/

## 2021-07-25 ENCOUNTER — Other Ambulatory Visit (HOSPITAL_COMMUNITY): Payer: Self-pay

## 2021-07-25 ENCOUNTER — Telehealth: Payer: Self-pay

## 2021-07-25 NOTE — Telephone Encounter (Signed)
Pt requesting to drop off urine sample for potential UTI. She started having odor and discharge 3-4 days ago, +nausea, denies fever. She is requesting an abx. States Percell Miller has previously let her do this.

## 2021-07-25 NOTE — Telephone Encounter (Signed)
Pt called and lvm to return call  Pt needs a vv or either office visit for uti sx and controlled visit per PCP

## 2021-07-26 ENCOUNTER — Encounter: Payer: Self-pay | Admitting: Family Medicine

## 2021-07-26 ENCOUNTER — Other Ambulatory Visit (HOSPITAL_BASED_OUTPATIENT_CLINIC_OR_DEPARTMENT_OTHER): Payer: Self-pay

## 2021-07-26 ENCOUNTER — Ambulatory Visit (INDEPENDENT_AMBULATORY_CARE_PROVIDER_SITE_OTHER): Payer: 59 | Admitting: Family Medicine

## 2021-07-26 VITALS — BP 120/84 | HR 79 | Temp 97.9°F | Ht 72.0 in | Wt 174.0 lb

## 2021-07-26 DIAGNOSIS — N3090 Cystitis, unspecified without hematuria: Secondary | ICD-10-CM | POA: Diagnosis not present

## 2021-07-26 LAB — POC URINALSYSI DIPSTICK (AUTOMATED)
Glucose, UA: NEGATIVE
Ketones, UA: POSITIVE
Leukocytes, UA: NEGATIVE
Nitrite, UA: NEGATIVE
Protein, UA: POSITIVE — AB
Spec Grav, UA: >=1.03 — AB (ref 1.010–1.025)
Urobilinogen, UA: 0.2 E.U./dL
pH, UA: 6 (ref 5.0–8.0)

## 2021-07-26 MED ORDER — PHENAZOPYRIDINE HCL 100 MG PO TABS
100.0000 mg | ORAL_TABLET | Freq: Three times a day (TID) | ORAL | 0 refills | Status: DC | PRN
  Filled 2021-07-26: qty 30, 10d supply, fill #0

## 2021-07-26 MED ORDER — SULFAMETHOXAZOLE-TRIMETHOPRIM 800-160 MG PO TABS
1.0000 | ORAL_TABLET | Freq: Two times a day (BID) | ORAL | 0 refills | Status: AC
  Filled 2021-07-26: qty 6, 3d supply, fill #0

## 2021-07-26 NOTE — Progress Notes (Signed)
Chief Complaint  Patient presents with   Nausea    Abdominal pain    Hematuria    [name] is a 56 y.o. female here for possible UTI.  Duration: 4 days. Symptoms: lower abd pain, hematuria, urinary hesitancy, nausea, and discharge Denies: urinary retention, fever, vomiting, and urgency Hx of recurrent UTI? Gets 1 per 2-3 months Denies new sexual partners.  Past Medical History:  Diagnosis Date   ABDOMINAL PAIN-EPIGASTRIC 09/14/2009   Qualifier: Diagnosis of  By: Trellis Paganini PA-c, [name]    Acute cystitis 07/07/2017   ANKLE PAIN, LEFT 12/25/2009   Qualifier: Diagnosis of  By: Amil Amen MD, [name]     Anxiety 01/22/2016   CAD (coronary artery disease) 07/07/2017   Cardiac murmur 02/01/2019   Cardiomyopathy, secondary ([place]) 09/06/2009   Qualifier: Diagnosis of  By: Arvid Right   Formatting of this note might be different from the original. Overview:  Qualifier: Diagnosis of  By: Arvid Right   Cardiomyopathy, unspecified ([place]) 09/06/2009   Formatting of this note might be different from the original. Overview:  Overview:  Qualifier: Diagnosis of  By: Arvid Right  Overview:  Overview:  Qualifier: Diagnosis of  By: Arvid Right Formatting of this note might be different from the original. Overview:  Qualifier: Diagnosis of  By: Arvid Right   Cerebrovascular disease 09/13/2012   Cervical cancer ([place])    had surgery in 2001.   CHEST PAIN, ATYPICAL 07/30/2009   Qualifier: Diagnosis of  By: Amil Amen MD, Winona Legato of this note might be different from the original. Overview:  Qualifier: Diagnosis of  By: Amil Amen MD, [name]   Chronic back pain    Chronic kidney disease, stage III (moderate) ([place]) 05/23/2014   Chronic pain syndrome 01/23/2016   Coronary artery disease    30% lesions noted   Current use of insulin ([place]) 05/23/2014   Depression    Diabetes mellitus    DIABETES MELLITUS,  TYPE II, UNCONTROLLED 07/30/2009   Qualifier: Diagnosis of  By: Amil Amen MD, [name]     Diabetes type 2, uncontrolled 05/23/2014   Diabetic gastroparesis associated with type 1 diabetes mellitus ([place]) 11/27/2016   DIABETIC PERIPHERAL NEUROPATHY 09/21/2009   Qualifier: Diagnosis of  By: Amil Amen MD, [name]     Diabetic peripheral neuropathy ([place]) 01/23/2016   Dysuria 12/25/2009   Qualifier: Diagnosis of  By: Amil Amen MD, [name]     Elevation of level of transaminase or lactic acid dehydrogenase (LDH) 09/21/2009   Formatting of this note might be different from the original. Overview:  Qualifier: Diagnosis of  By: Amil Amen MD, [place]   Essential hypertension 08/30/2009   Qualifier: Diagnosis of  By: Lovette Cliche, CNA, Christy     Gastric atony 07/30/2009   Formatting of this note might be different from the original. Overview:  Qualifier: Diagnosis of  By: Amil Amen MD, [name]   Gastroesophageal reflux disease 07/30/2009   Formatting of this note might be different from the original. Overview:  Overview:  Qualifier: Diagnosis of  By: Amil Amen MD, Elby Showers: Diagnosis of  By: Chester Holstein NP, Ellwood Handler of this note might be different from the original. Overview:  Qualifier: Diagnosis of  By: Amil Amen MD, Elby Showers: Diagnosis of  By: Chester Holstein NP, Nevin Bloodgood   Gastroparesis 07/30/2009   Qualifier: Diagnosis of  By: Amil Amen MD, Carl     GERD 07/30/2009   Qualifier: Diagnosis of  By: Amil Amen  MD, Elby Showers: Diagnosis of  By: Chester Holstein NP, Nevin Bloodgood     GERD (gastroesophageal reflux disease)    History of cervical cancer 08/17/2015   Status post partial hysterectomy  Formatting of this note might be different from the original. Status post partial hysterectomy   HLD (hyperlipidemia) 07/30/2009   Qualifier: Diagnosis of  By: Amil Amen MD, [name]     Hyperlipidemia    Hyperlipidemia, unspecified 07/30/2009   Formatting of this note might be different from the original.  Overview:  Qualifier: Diagnosis of  By: Amil Amen MD, Winona Legato of this note might be different from the original. Overview:  Qualifier: Diagnosis of  By: Amil Amen MD, [place]   Hypertension    INSOMNIA 09/21/2009   Qualifier: Diagnosis of  By: Amil Amen MD, [name]     Lumbar facet arthropathy 05/14/2016   Lumbar spinal stenosis 02/11/2018   Metatarsalgia of both feet 03/11/2017   Migraine 09/13/2012   Overview:  IMPRESSION: possible abd migraine   Mixed dyslipidemia 02/01/2019   Nausea & vomiting 07/07/2017   Nausea with vomiting, unspecified 09/14/2009   Qualifier: Diagnosis of  By: Shane Crutch, [name]   Formatting of this note might be different from the original. Overview:  Qualifier: Diagnosis of  By: Shane Crutch, [name]   Neuropathy    Nonspecific abnormal findings on radiological and examination of skull and head 09/13/2012   Overweight (BMI 25.0-29.9) 07/21/2014   POSITIVE PPD 07/30/2009   Annotation: CXR clear ?  diagnosed in 2/11?  On INH/pyridoxine Qualifier: Diagnosis of  By: Amil Amen MD, [name]     Pure hypercholesterolemia 11/27/2016   Retention of urine 06/25/2011   Right flank pain    TOBACCO ABUSE 09/21/2009   Qualifier: Diagnosis of  By: Amil Amen MD, Winona Legato of this note might be different from the original. Overview:  Qualifier: Diagnosis of  By: Amil Amen MD, Elinor Parkinson, SERUM, ELEVATED 09/21/2009   Qualifier: Diagnosis of  By: Amil Amen MD, [name]     Trigeminal neuralgia    Tuberculin test reaction 07/30/2009   Formatting of this note might be different from the original. Overview:  Annotation: CXR clear ?  diagnosed in 2/11?  On INH/pyridoxine Qualifier: Diagnosis of  By: Amil Amen MD, [name]   Type 2 diabetes mellitus with hyperglycemia ([place]) 07/30/2009   Qualifier: Diagnosis of  By: Amil Amen MD, Winona Legato of this note might be different from the original. Overview:  Qualifier: Diagnosis of  By: Amil Amen MD, [name]    Type 2 diabetes mellitus with hyperglycemia, with long-term current use of insulin ([place]) 07/30/2009   Formatting of this note might be different from the original. Overview:  05/30/2015 A1C was 16.7% (!)  Overview:  05/30/2015 A1C was 16.7% (!) Formatting of this note might be different from the original. 05/30/2015 A1C was 16.7% (!)   Type 2 diabetes mellitus with stage 3 chronic kidney disease ([place]) 09/14/2009   Qualifier: Diagnosis of  By: Shane Crutch, [name]    ULCER-GASTRIC 09/28/2009   Qualifier: Diagnosis of  By: Chester Holstein NP, Paula     URINARY TRACT INFECTION 09/21/2009   Qualifier: Diagnosis of  By: Amil Amen MD, Louanne Belton, CANDIDAL 12/25/2009   Qualifier: Diagnosis of  By: Amil Amen MD, Tessa Lerner D deficiency 04/25/2013     BP 120/84   Pulse 79   Temp 97.9 F (36.6 C) (Oral)   Ht 6' (1.829 m)   Wt  174 lb (78.9 kg)   SpO2 97%   BMI 23.60 kg/m  General: Awake, alert, appears stated age Heart: RRR Lungs: CTAB, normal respiratory effort, no accessory muscle usage Abd: BS+, soft, diffuse ttp, worse in suprapubic region, ND, no masses or organomegaly MSK: No CVA tenderness, neg Lloyd's sign Psych: Age appropriate judgment and insight  Cystitis - Plan: POCT Urinalysis Dipstick (Automated), Urine Culture, sulfamethoxazole-trimethoprim (BACTRIM DS) 800-160 MG tablet, phenazopyridine (PYRIDIUM) 100 MG tablet  W hematuria. 3 d of Bactrim, previous cultures show sensitivity. Pyridium OTC, also sent in to compare prices. Seek immediate care if pt starts to develop fevers, new/worsening symptoms, uncontrollable N/V. F/u prn. The patient voiced understanding and agreement to the plan.  [place], DO 07/26/21 3:32 PM

## 2021-07-26 NOTE — Telephone Encounter (Signed)
Appt scheduled

## 2021-07-26 NOTE — Patient Instructions (Signed)
Stay hydrated.   Warning signs/symptoms: Uncontrollable nausea/vomiting, fevers, worsening symptoms despite treatment, confusion.  Give us around 2 business days to get culture back to you.  Let us know if you need anything. 

## 2021-07-29 ENCOUNTER — Telehealth: Payer: Self-pay | Admitting: Family Medicine

## 2021-07-29 LAB — URINE CULTURE
MICRO NUMBER:: 13535837
SPECIMEN QUALITY:: ADEQUATE

## 2021-07-29 NOTE — Telephone Encounter (Signed)
Patient informed. She has completed the medicine and is just concerned that she may need more to get completely well

## 2021-07-29 NOTE — Telephone Encounter (Signed)
Give it a few more days to clear out plz.

## 2021-07-29 NOTE — Telephone Encounter (Signed)
Pt saw Dr.Wendling last wek and wanted him to know there is still blood in her urine. She would like to know what to do.

## 2021-07-31 MED ORDER — OXYCODONE HCL 5 MG PO TABA: ORAL_TABLET | ORAL | 0 refills | Status: DC

## 2021-07-31 NOTE — Telephone Encounter (Signed)
Probably should be seen if she is still having abd issues. Ty.

## 2021-07-31 NOTE — Telephone Encounter (Signed)
Pt states she is still having symptoms including nausea and upset stomach. Pt requests additional medication.  Pt also requested that her oxycodone be resent. Sent on 6/14 to [facility]. [facility] did not fill due to pt's copay. See note on rx from pharmacy:   "No insurance, has not picked up expensive meds in past. Cost is $170.44 for 10 tabs. No answer on calls. 07/25/21"  Please send to [place], [place].   Requesting: Oxycodone Contract: 7/22 UDS: 10/30/20 Last Visit: 07/26/21 Next Visit: 08/12/21 Last Refill: 07/24/21  Please Advise

## 2021-08-01 ENCOUNTER — Telehealth: Payer: Self-pay | Admitting: *Deleted

## 2021-08-01 NOTE — Telephone Encounter (Signed)
[facility] faxed over stating that oxycodone (oxaydo), they do not carry that formulation.  Did you meant to sent that in?

## 2021-08-02 ENCOUNTER — Other Ambulatory Visit (HOSPITAL_COMMUNITY): Payer: Self-pay

## 2021-08-02 ENCOUNTER — Telehealth: Payer: Self-pay | Admitting: Medical

## 2021-08-02 ENCOUNTER — Other Ambulatory Visit (HOSPITAL_BASED_OUTPATIENT_CLINIC_OR_DEPARTMENT_OTHER): Payer: Self-pay

## 2021-08-02 ENCOUNTER — Encounter: Payer: Self-pay | Admitting: Medical

## 2021-08-02 MED ORDER — OXYCODONE HCL 5 MG PO TABS
5.0000 mg | ORAL_TABLET | Freq: Four times a day (QID) | ORAL | 0 refills | Status: AC | PRN
  Filled 2021-08-02: qty 20, 5d supply, fill #0

## 2021-08-02 MED ORDER — CIPROFLOXACIN HCL 500 MG PO TABS: 500.0000 mg | ORAL_TABLET | Freq: Two times a day (BID) | ORAL | 0 refills | Status: DC

## 2021-08-02 NOTE — Telephone Encounter (Signed)
Rx cipro sent to pt pharmacy. Per culture result. Pt had 3 days of bactrim and was still symptomatic.

## 2021-08-08 ENCOUNTER — Other Ambulatory Visit (HOSPITAL_BASED_OUTPATIENT_CLINIC_OR_DEPARTMENT_OTHER): Payer: Self-pay

## 2021-08-12 ENCOUNTER — Ambulatory Visit: Payer: 59 | Admitting: Medical

## 2021-08-16 ENCOUNTER — Ambulatory Visit: Payer: 59 | Admitting: Medical

## 2021-08-21 ENCOUNTER — Ambulatory Visit (INDEPENDENT_AMBULATORY_CARE_PROVIDER_SITE_OTHER): Payer: 59 | Admitting: Medical

## 2021-08-21 VITALS — BP 110/70 | HR 93 | Resp 18 | Ht 72.0 in | Wt 180.0 lb

## 2021-08-21 DIAGNOSIS — I10 Essential (primary) hypertension: Secondary | ICD-10-CM | POA: Diagnosis not present

## 2021-08-21 DIAGNOSIS — Z794 Long term (current) use of insulin: Secondary | ICD-10-CM

## 2021-08-21 DIAGNOSIS — R112 Nausea with vomiting, unspecified: Secondary | ICD-10-CM

## 2021-08-21 DIAGNOSIS — E785 Hyperlipidemia, unspecified: Secondary | ICD-10-CM

## 2021-08-21 DIAGNOSIS — G8929 Other chronic pain: Secondary | ICD-10-CM

## 2021-08-21 DIAGNOSIS — G629 Polyneuropathy, unspecified: Secondary | ICD-10-CM

## 2021-08-21 DIAGNOSIS — M79671 Pain in right foot: Secondary | ICD-10-CM | POA: Diagnosis not present

## 2021-08-21 DIAGNOSIS — K219 Gastro-esophageal reflux disease without esophagitis: Secondary | ICD-10-CM

## 2021-08-21 DIAGNOSIS — R300 Dysuria: Secondary | ICD-10-CM

## 2021-08-21 DIAGNOSIS — Z79899 Other long term (current) drug therapy: Secondary | ICD-10-CM

## 2021-08-21 DIAGNOSIS — M5440 Lumbago with sciatica, unspecified side: Secondary | ICD-10-CM

## 2021-08-21 DIAGNOSIS — E1165 Type 2 diabetes mellitus with hyperglycemia: Secondary | ICD-10-CM | POA: Diagnosis not present

## 2021-08-21 DIAGNOSIS — R944 Abnormal results of kidney function studies: Secondary | ICD-10-CM

## 2021-08-21 DIAGNOSIS — M79672 Pain in left foot: Secondary | ICD-10-CM

## 2021-08-21 DIAGNOSIS — R110 Nausea: Secondary | ICD-10-CM

## 2021-08-21 DIAGNOSIS — I5032 Chronic diastolic (congestive) heart failure: Secondary | ICD-10-CM

## 2021-08-21 MED ORDER — OXYCODONE HCL 5 MG PO TABS: ORAL_TABLET | ORAL | 0 refills | Status: DC

## 2021-08-21 MED ORDER — PROMETHAZINE HCL 12.5 MG PO TABS: 12.5000 mg | ORAL_TABLET | Freq: Three times a day (TID) | ORAL | 1 refills | Status: DC | PRN

## 2021-08-21 MED ORDER — SERTRALINE HCL 100 MG PO TABS: 100.0000 mg | ORAL_TABLET | Freq: Every day | ORAL | 3 refills | Status: DC

## 2021-08-21 MED ORDER — GABAPENTIN 300 MG PO CAPS: 600.0000 mg | ORAL_CAPSULE | Freq: Three times a day (TID) | ORAL | 0 refills | Status: DC

## 2021-08-21 MED ORDER — BUSPIRONE HCL 15 MG PO TABS: 15.0000 mg | ORAL_TABLET | Freq: Two times a day (BID) | ORAL | 4 refills | Status: DC

## 2021-08-21 MED ORDER — ATORVASTATIN CALCIUM 80 MG PO TABS
80.0000 mg | ORAL_TABLET | Freq: Every day | ORAL | 3 refills | Status: DC
Start: 2021-08-21 — End: 2022-01-13

## 2021-08-21 NOTE — Patient Instructions (Addendum)
Type 2 diabetes with no recent A1c.  Continue Ozempic 0.5 mg weekly.  Get A1c this Friday and see if additional medication needed.  Hypertension but today blood pressure is on lower side.  In addition you did not take amlodipine or carvedilol today.  Stay off of your blood pressure medications but check your blood pressure tonight, tomorrow morning and Friday morning.  Give me an update on Friday morning when you are in for lab work.  During the interim make sure you are well-hydrated.  We will check if your mild anemia has worsened.  Sometimes significant anemia can affect blood pressure.  Hyperlipidemia-continue atorvastatin and get fasting lipid panel with metabolic panel on Friday.  GERD we will refill your famotidine.  History of chronic intermittent nausea.  Refill milligram and will get lipase with future labs.  Chronic back pain, refill meloxicam and will prescribe oxy IR 5 mg to take twice daily.  Patient will update contract today and give UDS.  Also neuropathy associated with the back pain.  Refilled gabapentin.  History of CHF clinically stable but will get BMP.  Left foot callus.  Area looks stable presently but will eventually need to get surgery per patient report.  If area worsens or changes let me know.  Depression and anxiety relatively well controlled despite challenging life circumstances.  Continue sertraline and BuSpar.  If depression or anxiety worsens please let me know.  Follow-up date to be determined after lab work.  But would asked that you definitely make 57-monthappointment for diabetic routine follow-up.

## 2021-08-21 NOTE — Progress Notes (Signed)
Subjective:    Patient ID: [name], female    DOB: 09-Jun-1965, 56 y.o.   MRN: [id number]  HPI  Pt in for follow up.  Pt update me that she needs to get surgery for ulcer area bottom of left foot. She made decision to delay surgery so she could work. One of her jobs she lost during hospitalization.  Just recently she got another job.   Diabetes- not on metformin or semglee. No recent a1c. Pt states she got samples of ozempic. States got samples from prior endocrinogist. Is on 0.5 mg weekly.   Htn- bp well controlled. On amlodipine and carvedilol daily but did not take today. Pt does not check her bp at home.  Hyperlipidemia- on atorvastatin 80 mg daily.  Depression and anxiety. On sertraline and buspar.  Back pain with radicular pain- pt on meloxicam and gabapentin. She has chronic back pain. Pt had prior dx of below. Pt does use oxycodone sporadically. When she works pain will be more severe. Discussed today with increased work and more use of oxy will give 60 tab rx.  1. Chronic pain syndrome  2. Diabetic peripheral neuropathy ([place])  3. Spinal stenosis of lumbar region with neurogenic claudication  4. Lumbar facet arthropathy    Review of Systems  Constitutional:  Negative for chills and fatigue.  Respiratory:  Negative for cough, chest tightness, shortness of breath and wheezing.   Cardiovascular:  Negative for chest pain and palpitations.  Gastrointestinal:  Negative for abdominal pain, diarrhea and nausea.  Genitourinary:  Negative for difficulty urinating and dysuria.  Musculoskeletal:  Positive for back pain. Negative for gait problem and myalgias.  Skin:  Negative for rash.  Neurological:  Negative for dizziness, speech difficulty, weakness, numbness and headaches.  Hematological:  Negative for adenopathy. Does not bruise/bleed easily.  Psychiatric/Behavioral:  Positive for dysphoric mood. Negative for behavioral problems, confusion and suicidal ideas. The  patient is nervous/anxious.     Past Medical History:  Diagnosis Date   ABDOMINAL PAIN-EPIGASTRIC 09/14/2009   Qualifier: Diagnosis of  By: Trellis Paganini PA-c, [name]    Acute cystitis 07/07/2017   ANKLE PAIN, LEFT 12/25/2009   Qualifier: Diagnosis of  By: Amil Amen MD, [name]     Anxiety 01/22/2016   CAD (coronary artery disease) 07/07/2017   Cardiac murmur 02/01/2019   Cardiomyopathy, secondary ([place]) 09/06/2009   Qualifier: Diagnosis of  By: Arvid Right   Formatting of this note might be different from the original. Overview:  Qualifier: Diagnosis of  By: Arvid Right   Cardiomyopathy, unspecified ([place]) 09/06/2009   Formatting of this note might be different from the original. Overview:  Overview:  Qualifier: Diagnosis of  By: Arvid Right  Overview:  Overview:  Qualifier: Diagnosis of  By: Arvid Right Formatting of this note might be different from the original. Overview:  Qualifier: Diagnosis of  By: Arvid Right   Cerebrovascular disease 09/13/2012   Cervical cancer ([place])    had surgery in 2001.   CHEST PAIN, ATYPICAL 07/30/2009   Qualifier: Diagnosis of  By: Amil Amen MD, Winona Legato of this note might be different from the original. Overview:  Qualifier: Diagnosis of  By: Amil Amen MD, [name]   Chronic back pain    Chronic kidney disease, stage III (moderate) ([place]) 05/23/2014   Chronic pain syndrome 01/23/2016   Coronary artery disease    30% lesions noted   Current use of insulin (  [place]) 05/23/2014   Depression    Diabetes mellitus    DIABETES MELLITUS, TYPE II, UNCONTROLLED 07/30/2009   Qualifier: Diagnosis of  By: Amil Amen MD, [name]     Diabetes type 2, uncontrolled 05/23/2014   Diabetic gastroparesis associated with type 1 diabetes mellitus ([place]) 11/27/2016   DIABETIC PERIPHERAL NEUROPATHY 09/21/2009   Qualifier: Diagnosis of  By: Amil Amen MD, [name]     Diabetic peripheral  neuropathy ([place]) 01/23/2016   Dysuria 12/25/2009   Qualifier: Diagnosis of  By: Amil Amen MD, [name]     Elevation of level of transaminase or lactic acid dehydrogenase (LDH) 09/21/2009   Formatting of this note might be different from the original. Overview:  Qualifier: Diagnosis of  By: Amil Amen MD, [place]   Essential hypertension 08/30/2009   Qualifier: Diagnosis of  By: Lovette Cliche, CNA, Christy     Gastric atony 07/30/2009   Formatting of this note might be different from the original. Overview:  Qualifier: Diagnosis of  By: Amil Amen MD, [place]   Gastroesophageal reflux disease 07/30/2009   Formatting of this note might be different from the original. Overview:  Overview:  Qualifier: Diagnosis of  By: Amil Amen MD, Elby Showers: Diagnosis of  By: Chester Holstein NP, Ellwood Handler of this note might be different from the original. Overview:  Qualifier: Diagnosis of  By: Amil Amen MD, Elby Showers: Diagnosis of  By: Chester Holstein NP, Nevin Bloodgood   Gastroparesis 07/30/2009   Qualifier: Diagnosis of  By: Amil Amen MD, [place]     GERD 07/30/2009   Qualifier: Diagnosis of  By: Amil Amen MD, Elby Showers: Diagnosis of  By: Chester Holstein NP, Nevin Bloodgood     GERD (gastroesophageal reflux disease)    History of cervical cancer 08/17/2015   Status post partial hysterectomy  Formatting of this note might be different from the original. Status post partial hysterectomy   HLD (hyperlipidemia) 07/30/2009   Qualifier: Diagnosis of  By: Amil Amen MD, [name]     Hyperlipidemia    Hyperlipidemia, unspecified 07/30/2009   Formatting of this note might be different from the original. Overview:  Qualifier: Diagnosis of  By: Amil Amen MD, Winona Legato of this note might be different from the original. Overview:  Qualifier: Diagnosis of  By: Amil Amen MD, [name]   Hypertension    INSOMNIA 09/21/2009   Qualifier: Diagnosis of  By: Amil Amen MD, [name]     Lumbar facet arthropathy 05/14/2016   Lumbar spinal  stenosis 02/11/2018   Metatarsalgia of both feet 03/11/2017   Migraine 09/13/2012   Overview:  IMPRESSION: possible abd migraine   Mixed dyslipidemia 02/01/2019   Nausea & vomiting 07/07/2017   Nausea with vomiting, unspecified 09/14/2009   Qualifier: Diagnosis of  By: Shane Crutch, [name]   Formatting of this note might be different from the original. Overview:  Qualifier: Diagnosis of  By: Shane Crutch, [name]   Neuropathy    Nonspecific abnormal findings on radiological and examination of skull and head 09/13/2012   Overweight (BMI 25.0-29.9) 07/21/2014   POSITIVE PPD 07/30/2009   Annotation: CXR clear ?  diagnosed in 2/11?  On INH/pyridoxine Qualifier: Diagnosis of  By: Amil Amen MD, [name]     Pure hypercholesterolemia 11/27/2016   Retention of urine 06/25/2011   Right flank pain    TOBACCO ABUSE 09/21/2009   Qualifier: Diagnosis of  By: Amil Amen MD, Winona Legato of this note might be different from the original. Overview:  Qualifier: Diagnosis of  By: Amil Amen MD, Elinor Parkinson,  SERUM, ELEVATED 09/21/2009   Qualifier: Diagnosis of  By: Amil Amen MD, [name]     Trigeminal neuralgia    Tuberculin test reaction 07/30/2009   Formatting of this note might be different from the original. Overview:  Annotation: CXR clear ?  diagnosed in 2/11?  On INH/pyridoxine Qualifier: Diagnosis of  By: Amil Amen MD, [name]   Type 2 diabetes mellitus with hyperglycemia ([place]) 07/30/2009   Qualifier: Diagnosis of  By: Amil Amen MD, Winona Legato of this note might be different from the original. Overview:  Qualifier: Diagnosis of  By: Amil Amen MD, [name]   Type 2 diabetes mellitus with hyperglycemia, with long-term current use of insulin ([place]) 07/30/2009   Formatting of this note might be different from the original. Overview:  05/30/2015 A1C was 16.7% (!)  Overview:  05/30/2015 A1C was 16.7% (!) Formatting of this note might be different from the original. 05/30/2015 A1C was 16.7% (!)    Type 2 diabetes mellitus with stage 3 chronic kidney disease ([place]) 09/14/2009   Qualifier: Diagnosis of  By: Shane Crutch, [name]    ULCER-GASTRIC 09/28/2009   Qualifier: Diagnosis of  By: Chester Holstein NP, Paula     URINARY TRACT INFECTION 09/21/2009   Qualifier: Diagnosis of  By: Amil Amen MD, Louanne Belton, CANDIDAL 12/25/2009   Qualifier: Diagnosis of  By: Amil Amen MD, Tessa Lerner D deficiency 04/25/2013     Social History   Socioeconomic History   Marital status: Single    Spouse name: Not on file   Number of children: Not on file   Years of education: Not on file   Highest education level: Not on file  Occupational History   Not on file  Tobacco Use   Smoking status: Never   Smokeless tobacco: Never  Vaping Use   Vaping Use: Never used  Substance and Sexual Activity   Alcohol use: Yes    Comment: occasionally   Drug use: No   Sexual activity: Not on file  Other Topics Concern   Not on file  Social History Narrative   Not on file   Social Determinants of Health   Financial Resource Strain: Not on file  Food Insecurity: Not on file  Transportation Needs: Not on file  Physical Activity: Not on file  Stress: Not on file  Social Connections: Not on file  Intimate Partner Violence: Not on file    Past Surgical History:  Procedure Laterality Date   ABDOMINAL HYSTERECTOMY     partial   CHOLECYSTECTOMY     I & D EXTREMITY Left 04/24/2021   Procedure: LEFT LEG DEBRIDEMENT;  Surgeon: [name], MD;  Location: [facility];  Service: Orthopedics;  Laterality: Left;   I & D EXTREMITY Left 05/01/2021   Procedure: LEFT KNEE DEBRIDEMENT;  Surgeon: [name], MD;  Location: [facility];  Service: Orthopedics;  Laterality: Left;    Family History  Problem Relation Age of Onset   Diabetes Mother    Diabetes Father     Allergies  Allergen Reactions   Insulin Aspart Other (See Comments) and Swelling    adverse reaction to Novolog    Latex Itching and Rash    Morphine Itching and Rash    Current Outpatient Medications on File Prior to Visit  Medication Sig Dispense Refill   albuterol (VENTOLIN HFA) 108 (90 Base) MCG/ACT inhaler Inhale 2 puffs into the lungs every 6 (six) hours as needed for wheezing or shortness of breath.  18 g 2   amLODipine (NORVASC) 5 MG tablet Take 1 tablet (5 mg total) by mouth daily. 90 tablet 3   aspirin 81 MG tablet Take 81 mg by mouth daily.     atorvastatin (LIPITOR) 80 MG tablet Take 1 tablet (80 mg total) by mouth daily. 90 tablet 0   azelastine (ASTELIN) 0.1 % nasal spray Place 2 sprays into both nostrils 2 (two) times daily. 30 mL 1   busPIRone (BUSPAR) 15 MG tablet Take 1 tablet (15 mg total) by mouth 2 (two) times daily. (Patient taking differently: Take 15 mg by mouth 2 (two) times daily as needed (anxiety).) 60 tablet 3   carvedilol (COREG) 25 MG tablet Take 1 tablet (25 mg total) by mouth 2 (two) times daily with a meal. 180 tablet 0   ciprofloxacin (CIPRO) 500 MG tablet Take 1 tablet (500 mg total) by mouth 2 (two) times daily. 14 tablet 0   famotidine (PEPCID) 20 MG tablet Take 1 tablet (20 mg total) by mouth daily. 30 tablet 0   fluticasone (FLONASE) 50 MCG/ACT nasal spray Place 2 sprays into both nostrils daily. 16 g 1   fluticasone furoate-vilanterol (BREO ELLIPTA) 100-25 MCG/INH AEPB Inhale 1 puff into the lungs daily. 100 each 2   gabapentin (NEURONTIN) 300 MG capsule Take 2 capsules (600 mg total) by mouth 3 (three) times daily. 270 capsule 0   insulin glargine-yfgn (SEMGLEE) 100 UNIT/ML Pen Inject 40 Units into the skin at bedtime. 15 mL 11   meloxicam (MOBIC) 7.5 MG tablet Take 7.5 mg by mouth 2 (two) times daily.     metFORMIN (GLUCOPHAGE-XR) 500 MG 24 hr tablet Take 1 tablet (500 mg total) by mouth daily with breakfast. (Patient taking differently: Take 500 mg by mouth 2 (two) times daily.) 60 tablet 2   methocarbamol (ROBAXIN) 750 MG tablet Take 1 tablet (750 mg total) by mouth every 8 (eight) hours  as needed for muscle spasms. 30 tablet 0   nitroGLYCERIN (NITROSTAT) 0.4 MG SL tablet Place 1 tablet (0.4 mg total) under the tongue every 5 (five) minutes as needed for chest pain. 30 tablet 3   phenazopyridine (PYRIDIUM) 100 MG tablet Take 1 tablet (100 mg total) by mouth 3 (three) times daily as needed for pain. 30 tablet 0   promethazine (PHENERGAN) 12.5 MG tablet Take 1 tablet (12.5 mg total) by mouth every 8 (eight) hours as needed. (Patient taking differently: Take 12.5 mg by mouth every 8 (eight) hours as needed for nausea or vomiting.) 30 tablet 0   sertraline (ZOLOFT) 100 MG tablet Take 1 tablet (100 mg total) by mouth daily. 30 tablet 3   topiramate (TOPAMAX) 50 MG tablet Take 1 tablet (50 mg total) by mouth 2 (two) times daily. (Patient taking differently: Take 50 mg by mouth 2 (two) times daily as needed (headache).) 60 tablet 12   No current facility-administered medications on file prior to visit.    BP 107/78   Pulse 93   Resp 18   Ht 6' (1.829 m)   Wt 180 lb (81.6 kg)   SpO2 99%   BMI 24.41 kg/m        Objective:   Physical Exam  General Appearance- Not in acute distress.    Chest and Lung Exam Auscultation: Breath sounds:-Normal. Clear even and unlabored. Adventitious sounds:- No Adventitious sounds.  Cardiovascular Auscultation:Rythm - Regular, rate and rythm. Heart Sounds -Normal heart sounds.  Abdomen Inspection:-Inspection Normal.  Palpation/Perucssion: Palpation and Percussion of the abdomen  reveal- Non Tender, No Rebound tenderness, No rigidity([place]) and No Palpable abdominal masses.  Liver:-Normal.  Spleen:- Normal.   Back Mid lumbar spine tenderness to palpation. Pain on straight leg lift. Pain on lateral movements and flexion/extension of the spine.  Lower ext neurologic  L5-S1 sensation intact bilaterally. Normal patellar reflexes bilaterally. No foot drop bilaterally.    Left foot- distal metarsal thick callous present, no redness  no dc.     Assessment & Plan:   Patient Instructions  Type 2 diabetes with no recent A1c.  Continue Ozempic 0.5 mg weekly.  Get A1c this Friday and see if additional medication needed.  Hypertension but today blood pressure is on lower side.  In addition you did not take amlodipine or carvedilol today.  Stay off of your blood pressure medications but check your blood pressure tonight, tomorrow morning and Friday morning.  Give me an update on Friday morning when you are in for lab work.  During the interim make sure you are well-hydrated.  We will check if your mild anemia has worsened.  Sometimes significant anemia can affect blood pressure.  Hyperlipidemia-continue atorvastatin and get fasting lipid panel with metabolic panel on Friday.  GERD we will refill your famotidine.  History of chronic intermittent nausea.  Refill milligram and will get lipase with future labs.  Chronic back pain, refill meloxicam and will prescribe oxy IR 5 mg to take twice daily.  Patient will update contract today and give UDS.  Also neuropathy associated with the back pain.  Refilled gabapentin.  History of CHF clinically stable but will get BMP.  Left foot callus.  Area looks stable presently but will eventually need to get surgery per patient report.  If area worsens or changes let me know.  Follow-up date to be determined after lab work.  But would asked that you definitely make 65-monthappointment for diabetic routine follow-up.    EMackie Pai PA-C   Time spent with patient today was 41  minutes which consisted of chart revdiew, discussing diagnosis, work up treatment and documentation.

## 2021-08-21 NOTE — Addendum Note (Signed)
Addended by: Jeronimo Greaves on: 08/21/2021 04:43 PM   Modules accepted: Orders

## 2021-08-21 NOTE — Addendum Note (Signed)
Addended by: Anabel Halon on: 08/21/2021 05:11 PM   Modules accepted: Orders

## 2021-08-22 ENCOUNTER — Telehealth: Payer: Self-pay | Admitting: Medical

## 2021-08-22 NOTE — Telephone Encounter (Signed)
Pharmacy states they need clinical information regarding oxyCODONE (OXY IR/ROXICODONE) 5 MG immediate release tablet , like diagnosis code, and action plan. Please advise.

## 2021-08-22 NOTE — Telephone Encounter (Signed)
Spoke with pharmacy and gave dx code and told them there was no action plan such as PT or pain management , pharmacy stated a flag came up in their system since pt went from acute to chronic use of medication

## 2021-08-23 ENCOUNTER — Other Ambulatory Visit: Payer: 59

## 2021-08-23 ENCOUNTER — Telehealth: Payer: Self-pay | Admitting: *Deleted

## 2021-08-23 NOTE — Telephone Encounter (Signed)
Caller Name [place] Phone Number [phone] Patient Name [name] Patient DOB 12/24/1959 Call Type Message Only Information Provided Reason for Call Request to Reschedule Office Appointment Initial Comment Caller states she needs to reschedule appt. for lab work. Patient request to speak to RN No Additional Comment hrs prov Disp. Time Disposition Final User 08/23/2021 7:27:58 AM General Information Provided Yes Idolina Primer

## 2021-08-23 NOTE — Telephone Encounter (Signed)
Appt rescheduled

## 2021-08-24 LAB — DRUG MONITORING, PANEL 8 WITH CONFIRMATION, URINE
6 Acetylmorphine: NEGATIVE ng/mL (ref <10)
Alcohol Metabolites: NEGATIVE ng/mL (ref <500)
Amphetamines: NEGATIVE ng/mL (ref <500)
Benzodiazepines: NEGATIVE ng/mL (ref <100)
Benzoylecgonine: 175 ng/mL — ABNORMAL HIGH (ref <100)
Buprenorphine, Urine: NEGATIVE ng/mL (ref <5)
Cocaine Metabolite: POSITIVE ng/mL — AB (ref <150)
Codeine: NEGATIVE ng/mL (ref <50)
Creatinine: 235.4 mg/dL (ref >=20.0)
Hydrocodone: NEGATIVE ng/mL (ref <50)
Hydromorphone: NEGATIVE ng/mL (ref <50)
MDMA: NEGATIVE ng/mL (ref <500)
Marijuana Metabolite: 12 ng/mL — ABNORMAL HIGH (ref <5)
Marijuana Metabolite: POSITIVE ng/mL — AB (ref <20)
Morphine: NEGATIVE ng/mL (ref <50)
Norhydrocodone: NEGATIVE ng/mL (ref <50)
Noroxycodone: 1024 ng/mL — ABNORMAL HIGH (ref <50)
Opiates: NEGATIVE ng/mL (ref <100)
Oxidant: NEGATIVE ug/mL (ref <200)
Oxycodone: 308 ng/mL — ABNORMAL HIGH (ref <50)
Oxycodone: POSITIVE ng/mL — AB (ref <100)
Oxymorphone: 523 ng/mL — ABNORMAL HIGH (ref <50)
pH: 6.3 (ref 4.5–9.0)

## 2021-08-24 LAB — DM TEMPLATE

## 2021-08-25 NOTE — Addendum Note (Signed)
Addended by: Anabel Halon on: 08/25/2021 08:05 AM   Modules accepted: Orders

## 2021-08-26 ENCOUNTER — Other Ambulatory Visit (INDEPENDENT_AMBULATORY_CARE_PROVIDER_SITE_OTHER): Payer: 59

## 2021-08-26 DIAGNOSIS — E1165 Type 2 diabetes mellitus with hyperglycemia: Secondary | ICD-10-CM | POA: Diagnosis not present

## 2021-08-26 DIAGNOSIS — M79671 Pain in right foot: Secondary | ICD-10-CM | POA: Diagnosis not present

## 2021-08-26 DIAGNOSIS — I5032 Chronic diastolic (congestive) heart failure: Secondary | ICD-10-CM | POA: Diagnosis not present

## 2021-08-26 DIAGNOSIS — E785 Hyperlipidemia, unspecified: Secondary | ICD-10-CM | POA: Diagnosis not present

## 2021-08-26 DIAGNOSIS — M79672 Pain in left foot: Secondary | ICD-10-CM | POA: Diagnosis not present

## 2021-08-26 DIAGNOSIS — Z794 Long term (current) use of insulin: Secondary | ICD-10-CM

## 2021-08-26 DIAGNOSIS — I10 Essential (primary) hypertension: Secondary | ICD-10-CM | POA: Diagnosis not present

## 2021-08-26 DIAGNOSIS — R300 Dysuria: Secondary | ICD-10-CM

## 2021-08-26 LAB — CBC WITH DIFFERENTIAL/PLATELET
Basophils Absolute: 0 10*3/uL (ref 0.0–0.1)
Basophils Relative: 0.3 % (ref 0.0–3.0)
Eosinophils Absolute: 0.6 10*3/uL (ref 0.0–0.7)
Eosinophils Relative: 8.9 % — ABNORMAL HIGH (ref 0.0–5.0)
HCT: 37.1 % (ref 36.0–46.0)
Hemoglobin: 12.3 g/dL (ref 12.0–15.0)
Lymphocytes Relative: 36.3 % (ref 12.0–46.0)
Lymphs Abs: 2.6 10*3/uL (ref 0.7–4.0)
MCHC: 33.2 g/dL (ref 30.0–36.0)
MCV: 88.1 fl (ref 78.0–100.0)
Monocytes Absolute: 0.7 10*3/uL (ref 0.1–1.0)
Monocytes Relative: 8.9 % (ref 3.0–12.0)
Neutro Abs: 3.3 10*3/uL (ref 1.4–7.7)
Neutrophils Relative %: 45.6 % (ref 43.0–77.0)
Platelets: 260 10*3/uL (ref 150.0–400.0)
RBC: 4.21 Mil/uL (ref 3.87–5.11)
RDW: 15 % (ref 11.5–15.5)
WBC: 7.3 10*3/uL (ref 4.0–10.5)

## 2021-08-26 LAB — BRAIN NATRIURETIC PEPTIDE: Pro B Natriuretic peptide (BNP): 13 pg/mL (ref 0.0–100.0)

## 2021-08-26 LAB — HEMOGLOBIN A1C: Hgb A1c MFr Bld: 9.8 % — ABNORMAL HIGH (ref 4.6–6.5)

## 2021-08-27 LAB — COMPREHENSIVE METABOLIC PANEL
ALT: 15 U/L (ref 0–35)
AST: 19 U/L (ref 0–37)
Albumin: 4 g/dL (ref 3.5–5.2)
Alkaline Phosphatase: 83 U/L (ref 39–117)
BUN: 23 mg/dL (ref 6–23)
CO2: 26 mEq/L (ref 19–32)
Calcium: 9.2 mg/dL (ref 8.4–10.5)
Chloride: 103 mEq/L (ref 96–112)
Creatinine, Ser: 1.62 mg/dL — ABNORMAL HIGH (ref 0.40–1.20)
GFR: 35.26 mL/min — ABNORMAL LOW (ref >60.00)
Glucose, Bld: 113 mg/dL — ABNORMAL HIGH (ref 70–99)
Potassium: 4.2 mEq/L (ref 3.5–5.1)
Sodium: 139 mEq/L (ref 135–145)
Total Bilirubin: 0.3 mg/dL (ref 0.2–1.2)
Total Protein: 7.5 g/dL (ref 6.0–8.3)

## 2021-08-27 LAB — LIPID PANEL
Cholesterol: 118 mg/dL (ref 0–200)
HDL: 37.3 mg/dL — ABNORMAL LOW (ref >39.00)
LDL Cholesterol: 62 mg/dL (ref 0–99)
NonHDL: 80.95
Total CHOL/HDL Ratio: 3
Triglycerides: 93 mg/dL (ref 0.0–149.0)
VLDL: 18.6 mg/dL (ref 0.0–40.0)

## 2021-08-27 LAB — URINE CULTURE
MICRO NUMBER:: 13655625
Result:: NO GROWTH
SPECIMEN QUALITY:: ADEQUATE

## 2021-08-27 NOTE — Addendum Note (Signed)
Addended by: Anabel Halon on: 08/27/2021 08:58 PM   Modules accepted: Orders

## 2021-08-28 MED ORDER — SEMAGLUTIDE (1 MG/DOSE) 4 MG/3ML ~~LOC~~ SOPN: 1.0000 mg | PEN_INJECTOR | SUBCUTANEOUS | 3 refills | Status: DC

## 2021-08-28 NOTE — Addendum Note (Signed)
Addended by: Anabel Halon on: 08/28/2021 05:14 PM   Modules accepted: Orders

## 2021-08-29 ENCOUNTER — Telehealth: Payer: Self-pay | Admitting: Medical

## 2021-08-29 ENCOUNTER — Other Ambulatory Visit: Payer: Self-pay

## 2021-08-29 MED ORDER — METHOCARBAMOL 750 MG PO TABS: 750.0000 mg | ORAL_TABLET | Freq: Three times a day (TID) | ORAL | 0 refills | Status: DC | PRN

## 2021-08-29 NOTE — Telephone Encounter (Signed)
Letter was sent to pt

## 2021-08-29 NOTE — Telephone Encounter (Signed)
Spoke with patient regarding positive UDS for Cocaine and Marijuana.  Advised PCP will not be prescribing pain medication going forward.  Informed referral to pain management has been ordered.  Patient verbalized understanding.

## 2021-08-29 NOTE — Telephone Encounter (Signed)
Patient requested refill of methocarbamol to   [facility], [place].  [address] [place] [address]  Phone:  [phone]  Fax:  [phone]

## 2021-08-29 NOTE — Telephone Encounter (Signed)
Ok to refill given past UDS

## 2021-08-29 NOTE — Telephone Encounter (Signed)
Spoke w/ [facility]- cancelled prescription for oxycodone.

## 2021-08-30 ENCOUNTER — Other Ambulatory Visit (HOSPITAL_BASED_OUTPATIENT_CLINIC_OR_DEPARTMENT_OTHER): Payer: Self-pay

## 2021-08-30 ENCOUNTER — Other Ambulatory Visit: Payer: Self-pay | Admitting: Medical

## 2021-08-30 ENCOUNTER — Telehealth: Payer: Self-pay

## 2021-08-30 NOTE — Telephone Encounter (Signed)
Do you want patient on ozmepic and insulin glargine-yfgn (SEMGLEE) 100 UNIT/ML Pen

## 2021-09-02 ENCOUNTER — Other Ambulatory Visit (HOSPITAL_BASED_OUTPATIENT_CLINIC_OR_DEPARTMENT_OTHER): Payer: Self-pay

## 2021-09-02 NOTE — Telephone Encounter (Signed)
PA submitted for Ozempic

## 2021-09-04 ENCOUNTER — Ambulatory Visit (INDEPENDENT_AMBULATORY_CARE_PROVIDER_SITE_OTHER): Payer: Commercial Managed Care - HMO | Admitting: Family

## 2021-09-04 ENCOUNTER — Ambulatory Visit (INDEPENDENT_AMBULATORY_CARE_PROVIDER_SITE_OTHER): Payer: Commercial Managed Care - HMO

## 2021-09-04 ENCOUNTER — Encounter: Payer: Self-pay | Admitting: Family

## 2021-09-04 DIAGNOSIS — L97511 Non-pressure chronic ulcer of other part of right foot limited to breakdown of skin: Secondary | ICD-10-CM

## 2021-09-04 DIAGNOSIS — L97521 Non-pressure chronic ulcer of other part of left foot limited to breakdown of skin: Secondary | ICD-10-CM

## 2021-09-04 DIAGNOSIS — M7742 Metatarsalgia, left foot: Secondary | ICD-10-CM

## 2021-09-04 DIAGNOSIS — L02612 Cutaneous abscess of left foot: Secondary | ICD-10-CM | POA: Diagnosis not present

## 2021-09-04 NOTE — Progress Notes (Signed)
Office Visit Note   Patient: [name]           Date of Birth: 1965-08-06           MRN: [id number] Visit Date: 09/04/2021              Requested by: Mackie Pai, PA-C [address] [place],  [place] [address] PCP: Mackie Pai, PA-C  Chief Complaint  Patient presents with   Right Foot - Wound Check      HPI: The patient is a 56 year old woman who presents today for 2 separate issues she is having worsening discoloration of her right foot second toe she has had issues with this toe for quite some time and has a ulcer to the tip of it she is concerned for worsening discoloration and swelling  Of the left foot she had previously been scheduled for Weil osteotomy of the second metatarsal this was in May.  The patient has had to put off surgery for financial reasons.  Today she is concerned for worsening ulceration beneath the first metatarsal head with foul odor and drainage.  Denies purulence has been having serosanguineous drainage per her report which has been worsening she has not been using pressure offloading pads  Assessment & Plan: Visit Diagnoses:  1. Right foot ulcer, limited to breakdown of skin ([place])   2. Metatarsalgia, left foot   3. Non-pressure chronic ulcer of other part of left foot limited to breakdown of skin ([place])     Plan: Will place on antibiotics concern for infection ulceration beneath the second metatarsal head on the left foot.  Radiographs of the right second toe unrevealing for osteomyelitis.  We will have her follow-up in the office in 2 weeks  Follow-Up Instructions: No follow-ups on file.   Ortho Exam  Patient is alert, oriented, no adenopathy, well-dressed, normal affect, normal respiratory effort. On examination of the left foot she does have a palpable dorsalis pedis pulse there is Wagner grade 1 ulcer beneath the second metatarsal head this is 2 and half centimeters in diameter there is nonviable tissue this was  debrided with a 10 blade knife underlying ulceration is 15 mm in diameter 3 mm deep there is proud granulation there is no active drainage there is some maceration and foul odor no ascending cellulitis On examination of the right foot the second toe is swollen there is not sausage digit swelling however there is darkened discoloration and distal ulceration 3 mm in diameter this does not probe to bone is 2 mm deep there is no active drainage no erythema  Imaging: No results found. No images are attached to the encounter.  Labs: Lab Results  Component Value Date   HGBA1C 9.8 (H) 08/26/2021   HGBA1C 12.3 (H) 04/23/2021   HGBA1C 7.9 (H) 08/24/2020   ESRSEDRATE 41 (H) 08/24/2020   REPTSTATUS 05/23/2021 FINAL 05/21/2021   GRAMSTAIN  05/01/2021    FEW WBC PRESENT,BOTH PMN AND MONONUCLEAR NO ORGANISMS SEEN    CULT >=100,000 COLONIES/mL SERRATIA MARCESCENS (A) 05/21/2021   LABORGA SERRATIA MARCESCENS (A) 05/21/2021     Lab Results  Component Value Date   ALBUMIN 4.0 08/26/2021   ALBUMIN 3.9 05/28/2021   ALBUMIN 3.1 (L) 05/22/2021    Lab Results  Component Value Date   MG 1.8 07/11/2017   No results found for: "VD25OH"  No results found for: "PREALBUMIN"    Latest Ref Rng & Units 08/26/2021    3:04 PM 06/14/2021  11:39 AM 05/28/2021   12:55 PM  CBC EXTENDED  WBC 4.0 - 10.5 K/uL 7.3  4.6  7.0   RBC 3.87 - 5.11 Mil/uL 4.21  4.33  3.79   Hemoglobin 12.0 - 15.0 g/dL 12.3  12.9  11.2   HCT 36.0 - 46.0 % 37.1  40.3  34.3   Platelets 150.0 - 400.0 K/uL 260.0  327  278.0   NEUT# 1.4 - 7.7 K/uL 3.3  1,504  3.1   Lymph# 0.7 - 4.0 K/uL 2.6  2,245  2.7      There is no height or weight on file to calculate BMI.  Orders:  Orders Placed This Encounter  Procedures   XR Toe 2nd Right   VAS [place] PAD ABI   No orders of the defined types were placed in this encounter.    Procedures: No procedures performed  Clinical Data: No additional findings.  ROS:  All other systems  negative, except as noted in the HPI. Review of Systems  Constitutional:  Negative for chills and fever.  Skin:  Positive for color change and wound.    Objective: Vital Signs: There were no vitals taken for this visit.  Specialty Comments:  No specialty comments available.  PMFS History: Patient Active Problem List   Diagnosis Date Noted   Sepsis ([place]) 05/21/2021   Chronic diastolic HF (heart failure) ([place]) 05/21/2021   Abscess of left lower leg    Necrotizing fasciitis ([place])    Cellulitis of left leg 04/22/2021   Sepsis due to cellulitis ([place]) 04/22/2021   Severe sepsis ([place]) 04/22/2021   Acute kidney injury superimposed on chronic kidney disease ([place]) 04/22/2021   Chest pain, unspecified 04/22/2021   Uncontrolled type 2 diabetes mellitus with hyperglycemia, without long-term current use of insulin ([place]) 04/22/2021   Pain of left calf 04/19/2021   Viral upper respiratory tract infection 11/30/2020   Trigeminal neuralgia    Cervical cancer (HCC)    Chronic back pain    Coronary artery disease    GERD (gastroesophageal reflux disease)    Hyperlipidemia    Hypertension    Dyslipidemia 02/01/2019   Cardiac murmur 02/01/2019   Lumbar spinal stenosis 02/11/2018   Neuropathy 07/20/2017   Right flank pain    Nausea & vomiting 07/07/2017   Acute cystitis 07/07/2017   CAD (coronary artery disease) 07/07/2017   Metatarsalgia of both feet 03/11/2017   Diabetic gastroparesis associated with type 1 diabetes mellitus ([place]) 11/27/2016   Pure hypercholesterolemia 11/27/2016   Lumbar facet arthropathy 05/14/2016   Chronic pain syndrome 01/23/2016   Diabetic peripheral neuropathy ([place]) 01/23/2016   Anxiety 01/22/2016   History of cervical cancer 08/17/2015   Depression 08/10/2015   Overweight (BMI 25.0-29.9) 07/21/2014   Chronic kidney disease, stage III (moderate) ([place]) 05/23/2014   Diabetes type 2, uncontrolled 05/23/2014   Current use of insulin ([place]) 05/23/2014   Vitamin D  deficiency 04/25/2013   Cerebrovascular disease 09/13/2012   Migraine 09/13/2012   Nonspecific abnormal findings on radiological and examination of skull and head 09/13/2012   Retention of urine 06/25/2011   ANKLE PAIN, LEFT 12/25/2009   DYSURIA 12/25/2009   VAGINITIS, CANDIDAL 12/25/2009   ULCER-GASTRIC 09/28/2009   DIABETIC PERIPHERAL NEUROPATHY 09/21/2009   TOBACCO ABUSE 09/21/2009   UTI (urinary tract infection) 09/21/2009   INSOMNIA 09/21/2009   TRANSAMINASES, SERUM, ELEVATED 09/21/2009   Elevation of level of transaminase or lactic acid dehydrogenase (LDH) 09/21/2009   Type 2 diabetes mellitus with stage 3 chronic kidney  disease ([place]) 09/14/2009   Nausea with vomiting, unspecified 09/14/2009   ABDOMINAL PAIN-EPIGASTRIC 09/14/2009   Cardiomyopathy, secondary ([place]) 09/06/2009   Cardiomyopathy, unspecified ([place]) 09/06/2009   Essential hypertension 08/30/2009   DM2 (diabetes mellitus, type 2) ([place]) 07/30/2009   HLD (hyperlipidemia) 07/30/2009   GERD 07/30/2009   GASTROPARESIS 07/30/2009   CHEST PAIN, ATYPICAL 07/30/2009   POSITIVE PPD 07/30/2009   Type 2 diabetes mellitus with hyperglycemia ([place]) 07/30/2009   Gastric atony 07/30/2009   Gastroesophageal reflux disease 07/30/2009   Hyperlipidemia, unspecified 07/30/2009   Tuberculin test reaction 07/30/2009   Past Medical History:  Diagnosis Date   ABDOMINAL PAIN-EPIGASTRIC 09/14/2009   Qualifier: Diagnosis of  By: Trellis Paganini PA-c, [name]    Acute cystitis 07/07/2017   ANKLE PAIN, LEFT 12/25/2009   Qualifier: Diagnosis of  By: Amil Amen MD, [name]     Anxiety 01/22/2016   CAD (coronary artery disease) 07/07/2017   Cardiac murmur 02/01/2019   Cardiomyopathy, secondary ([place]) 09/06/2009   Qualifier: Diagnosis of  By: Arvid Right   Formatting of this note might be different from the original. Overview:  Qualifier: Diagnosis of  By: Arvid Right   Cardiomyopathy, unspecified ([place]) 09/06/2009    Formatting of this note might be different from the original. Overview:  Overview:  Qualifier: Diagnosis of  By: Arvid Right  Overview:  Overview:  Qualifier: Diagnosis of  By: Hyman Hopes of this note might be different from the original. Overview:  Qualifier: Diagnosis of  By: Arvid Right   Cerebrovascular disease 09/13/2012   Cervical cancer ([place])    had surgery in 2001.   CHEST PAIN, ATYPICAL 07/30/2009   Qualifier: Diagnosis of  By: Amil Amen MD, Winona Legato of this note might be different from the original. Overview:  Qualifier: Diagnosis of  By: Amil Amen MD, [name]   Chronic back pain    Chronic kidney disease, stage III (moderate) ([place]) 05/23/2014   Chronic pain syndrome 01/23/2016   Coronary artery disease    30% lesions noted   Current use of insulin ([place]) 05/23/2014   Depression    Diabetes mellitus    DIABETES MELLITUS, TYPE II, UNCONTROLLED 07/30/2009   Qualifier: Diagnosis of  By: Amil Amen MD, [name]     Diabetes type 2, uncontrolled 05/23/2014   Diabetic gastroparesis associated with type 1 diabetes mellitus ([place]) 11/27/2016   DIABETIC PERIPHERAL NEUROPATHY 09/21/2009   Qualifier: Diagnosis of  By: Amil Amen MD, [name]     Diabetic peripheral neuropathy ([place]) 01/23/2016   Dysuria 12/25/2009   Qualifier: Diagnosis of  By: Amil Amen MD, [name]     Elevation of level of transaminase or lactic acid dehydrogenase (LDH) 09/21/2009   Formatting of this note might be different from the original. Overview:  Qualifier: Diagnosis of  By: Amil Amen MD, [place]   Essential hypertension 08/30/2009   Qualifier: Diagnosis of  By: Lovette Cliche, CNA, Christy     Gastric atony 07/30/2009   Formatting of this note might be different from the original. Overview:  Qualifier: Diagnosis of  By: Amil Amen MD, [name]   Gastroesophageal reflux disease 07/30/2009   Formatting of this note might be different from the original.  Overview:  Overview:  Qualifier: Diagnosis of  By: Amil Amen MD, Elby Showers: Diagnosis of  By: Chester Holstein NP, Ellwood Handler of this note might be different from the original. Overview:  Qualifier: Diagnosis of  By: Amil Amen MD, Elby Showers: Diagnosis of  By: Chester Holstein NP, Nevin Bloodgood   Gastroparesis 07/30/2009   Qualifier: Diagnosis of  By: Amil Amen MD, [place]     GERD 07/30/2009   Qualifier: Diagnosis of  By: Amil Amen MD, Elby Showers: Diagnosis of  By: Chester Holstein NP, Nevin Bloodgood     GERD (gastroesophageal reflux disease)    History of cervical cancer 08/17/2015   Status post partial hysterectomy  Formatting of this note might be different from the original. Status post partial hysterectomy   HLD (hyperlipidemia) 07/30/2009   Qualifier: Diagnosis of  By: Amil Amen MD, [name]     Hyperlipidemia    Hyperlipidemia, unspecified 07/30/2009   Formatting of this note might be different from the original. Overview:  Qualifier: Diagnosis of  By: Amil Amen MD, Winona Legato of this note might be different from the original. Overview:  Qualifier: Diagnosis of  By: Amil Amen MD, [place]   Hypertension    INSOMNIA 09/21/2009   Qualifier: Diagnosis of  By: Amil Amen MD, [name]     Lumbar facet arthropathy 05/14/2016   Lumbar spinal stenosis 02/11/2018   Metatarsalgia of both feet 03/11/2017   Migraine 09/13/2012   Overview:  IMPRESSION: possible abd migraine   Mixed dyslipidemia 02/01/2019   Nausea & vomiting 07/07/2017   Nausea with vomiting, unspecified 09/14/2009   Qualifier: Diagnosis of  By: Shane Crutch, [name]   Formatting of this note might be different from the original. Overview:  Qualifier: Diagnosis of  By: Shane Crutch, [name]   Neuropathy    Nonspecific abnormal findings on radiological and examination of skull and head 09/13/2012   Overweight (BMI 25.0-29.9) 07/21/2014   POSITIVE PPD 07/30/2009   Annotation: CXR clear ?  diagnosed in 2/11?  On INH/pyridoxine Qualifier:  Diagnosis of  By: Amil Amen MD, [name]     Pure hypercholesterolemia 11/27/2016   Retention of urine 06/25/2011   Right flank pain    TOBACCO ABUSE 09/21/2009   Qualifier: Diagnosis of  By: Amil Amen MD, Winona Legato of this note might be different from the original. Overview:  Qualifier: Diagnosis of  By: Amil Amen MD, Elinor Parkinson, SERUM, ELEVATED 09/21/2009   Qualifier: Diagnosis of  By: Amil Amen MD, [name]     Trigeminal neuralgia    Tuberculin test reaction 07/30/2009   Formatting of this note might be different from the original. Overview:  Annotation: CXR clear ?  diagnosed in 2/11?  On INH/pyridoxine Qualifier: Diagnosis of  By: Amil Amen MD, [name]   Type 2 diabetes mellitus with hyperglycemia ([place]) 07/30/2009   Qualifier: Diagnosis of  By: Amil Amen MD, Winona Legato of this note might be different from the original. Overview:  Qualifier: Diagnosis of  By: Amil Amen MD, [name]   Type 2 diabetes mellitus with hyperglycemia, with long-term current use of insulin ([place]) 07/30/2009   Formatting of this note might be different from the original. Overview:  05/30/2015 A1C was 16.7% (!)  Overview:  05/30/2015 A1C was 16.7% (!) Formatting of this note might be different from the original. 05/30/2015 A1C was 16.7% (!)   Type 2 diabetes mellitus with stage 3 chronic kidney disease ([place]) 09/14/2009   Qualifier: Diagnosis of  By: Shane Crutch, [name]    ULCER-GASTRIC 09/28/2009   Qualifier: Diagnosis of  By: Chester Holstein NP, Paula     URINARY TRACT INFECTION 09/21/2009   Qualifier: Diagnosis of  By: Amil Amen MD, Louanne Belton, CANDIDAL 12/25/2009   Qualifier: Diagnosis of  By: Amil Amen MD, Benjamine Mola  Vitamin D deficiency 04/25/2013    Family History  Problem Relation Age of Onset   Diabetes Mother    Diabetes Father     Past Surgical History:  Procedure Laterality Date   ABDOMINAL HYSTERECTOMY     partial   CHOLECYSTECTOMY     I & D EXTREMITY Left  04/24/2021   Procedure: LEFT LEG DEBRIDEMENT;  Surgeon: [name], MD;  Location: [facility];  Service: Orthopedics;  Laterality: Left;   I & D EXTREMITY Left 05/01/2021   Procedure: LEFT KNEE DEBRIDEMENT;  Surgeon: [name], MD;  Location: [facility];  Service: Orthopedics;  Laterality: Left;   Social History   Occupational History   Not on file  Tobacco Use   Smoking status: Never   Smokeless tobacco: Never  Vaping Use   Vaping Use: Never used  Substance and Sexual Activity   Alcohol use: Yes    Comment: occasionally   Drug use: No   Sexual activity: Not on file

## 2021-09-06 MED ORDER — TRULICITY 0.75 MG/0.5ML ~~LOC~~ SOAJ
0.7500 mg | SUBCUTANEOUS | 0 refills | Status: DC
  Filled 2021-09-06: qty 6, 84d supply, fill #0

## 2021-09-06 NOTE — Telephone Encounter (Signed)
Ozempic denied by insurance please advise

## 2021-09-06 NOTE — Telephone Encounter (Signed)
I have reviewed the request to cover Ozempic 1/0.75 (3) PEN INJCTR. The information submitted did not meet the criteria necessary to approve this medication.  Based on the information provided, I am unable to approve coverage for this medication because:  There is nothing to support that the individual has had failure, contraindication, or intolerance to the covered alternative, Trulicity (dulaglutide). There is no indication that your patient has met both of the following: A) Individual will continue maximally tolerated metformin therapy, if not contraindicated per FDA label, intolerant, or otherwise not a candidate; and B) Documentation of one of the following: 1. Unable to achieve goal HbA1C despite metformin or metformin-containing regimen (meglitinides, sulfonylureas, or thiazolidinediones) at greater than or equal to 1,500 mg per day; 2. Intolerance to metformin 1,500 mg per day despite appropriate dose titration duration (for example, period of 8-12 weeks); 3. Contraindication to metformin per FDA label (for example, acute/chronic metabolic acidosis, severe renal dysfunction); 4. Not a candidate for metformin (for example, hepatic impairment, moderate renal dysfunction, unstable heart failure, individual is using an agent for a non-diabetic FDAapproved indication); 5. Initial metformin combination therapy is clinically appropriate for elevated HbA1C (for example, greater than 1.5% above goal); or 6. Initial metformin combination therapy is clinically appropriate in an individual with co-morbid conditions (such as ASCVD, heart failure, or CKD). This drug requires supportive documentation for all answers including chart notes, lab/test results. This documentation was not included or was missing for some answers.

## 2021-09-06 NOTE — Telephone Encounter (Signed)
Can she try Trulicity?

## 2021-09-06 NOTE — Addendum Note (Signed)
Addended by: Anabel Halon on: 09/06/2021 09:14 PM   Modules accepted: Orders

## 2021-09-08 ENCOUNTER — Other Ambulatory Visit (HOSPITAL_BASED_OUTPATIENT_CLINIC_OR_DEPARTMENT_OTHER): Payer: Self-pay

## 2021-09-09 ENCOUNTER — Other Ambulatory Visit (HOSPITAL_BASED_OUTPATIENT_CLINIC_OR_DEPARTMENT_OTHER): Payer: Self-pay

## 2021-09-09 NOTE — Telephone Encounter (Signed)
Pt notified about medication changes

## 2021-09-13 ENCOUNTER — Telehealth: Payer: Self-pay | Admitting: Family

## 2021-09-13 ENCOUNTER — Other Ambulatory Visit: Payer: Self-pay | Admitting: Orthopedic Surgery

## 2021-09-13 MED ORDER — DOXYCYCLINE HYCLATE 100 MG PO TABS: 100.0000 mg | ORAL_TABLET | Freq: Two times a day (BID) | ORAL | 0 refills | Status: DC

## 2021-09-13 NOTE — Telephone Encounter (Signed)
It looks like this pt was in the office on 09/04/21 and was to have ABX called into pharm. I do not see anything in chart please advise.

## 2021-09-13 NOTE — Telephone Encounter (Signed)
Patient called advised her Rx for an antibiotic and antibiotic ointment is not showing received at the pharmacy yet. Patient asked for a call when Rx is sent to the pharmacy. The number to contact patient  is [phone]

## 2021-09-13 NOTE — Telephone Encounter (Signed)
I called pt and advised that rx has been sent to pharm and she will call with an update on how her toe is doing. If it gets worse she will make an appt.

## 2021-09-17 ENCOUNTER — Other Ambulatory Visit (HOSPITAL_BASED_OUTPATIENT_CLINIC_OR_DEPARTMENT_OTHER): Payer: Self-pay

## 2021-09-18 ENCOUNTER — Other Ambulatory Visit (HOSPITAL_BASED_OUTPATIENT_CLINIC_OR_DEPARTMENT_OTHER): Payer: Self-pay

## 2021-09-24 ENCOUNTER — Other Ambulatory Visit (HOSPITAL_BASED_OUTPATIENT_CLINIC_OR_DEPARTMENT_OTHER): Payer: Self-pay

## 2021-09-28 ENCOUNTER — Other Ambulatory Visit: Payer: Self-pay | Admitting: Medical

## 2021-10-20 ENCOUNTER — Ambulatory Visit
Admission: EM | Admit: 2021-10-20 | Discharge: 2021-10-20 | Disposition: A | Discharge status: Home / Self Care | Payer: Commercial Managed Care - HMO | Attending: Emergency Medicine | Admitting: Emergency Medicine

## 2021-10-20 DIAGNOSIS — U071 COVID-19: Secondary | ICD-10-CM | POA: Insufficient documentation

## 2021-10-20 DIAGNOSIS — B349 Viral infection, unspecified: Secondary | ICD-10-CM | POA: Insufficient documentation

## 2021-10-20 LAB — RESP PANEL BY RT-PCR (FLU A&B, COVID) ARPGX2
Influenza A by PCR: NEGATIVE
Influenza B by PCR: NEGATIVE
SARS Coronavirus 2 by RT PCR: POSITIVE — AB

## 2021-10-20 NOTE — ED Provider Notes (Signed)
[facility]    CSN: [id number] Arrival date & time: 10/20/21  1456    HISTORY   Chief Complaint  Patient presents with   Headache   HPI [name] is a pleasant, 56 y.o. female who presents to urgent care today. Patients C/O headache, body ache, sore throat, nausea and diarrhea, nasal congestion, runny nose and nonproductive cough. Denies known fever.  Patient states one of her coworkers has been sick but does not know what she had.  Patient states she has not tried anything to alleviate her symptoms.  Patient states symptoms began 4 days ago and have gotten progressively worse.  The history is provided by the patient.   Past Medical History:  Diagnosis Date   ABDOMINAL PAIN-EPIGASTRIC 09/14/2009   Qualifier: Diagnosis of  By: Trellis Paganini PA-c, [name]    Acute cystitis 07/07/2017   ANKLE PAIN, LEFT 12/25/2009   Qualifier: Diagnosis of  By: Amil Amen MD, [name]     Anxiety 01/22/2016   CAD (coronary artery disease) 07/07/2017   Cardiac murmur 02/01/2019   Cardiomyopathy, secondary ([place]) 09/06/2009   Qualifier: Diagnosis of  By: Arvid Right   Formatting of this note might be different from the original. Overview:  Qualifier: Diagnosis of  By: Arvid Right   Cardiomyopathy, unspecified ([place]) 09/06/2009   Formatting of this note might be different from the original. Overview:  Overview:  Qualifier: Diagnosis of  By: Arvid Right  Overview:  Overview:  Qualifier: Diagnosis of  By: Arvid Right Formatting of this note might be different from the original. Overview:  Qualifier: Diagnosis of  By: Arvid Right   Cerebrovascular disease 09/13/2012   Cervical cancer ([place])    had surgery in 2001.   CHEST PAIN, ATYPICAL 07/30/2009   Qualifier: Diagnosis of  By: Amil Amen MD, Winona Legato of this note might be different from the original. Overview:  Qualifier: Diagnosis of  By: Amil Amen  MD, [name]   Chronic back pain    Chronic kidney disease, stage III (moderate) ([place]) 05/23/2014   Chronic pain syndrome 01/23/2016   Coronary artery disease    30% lesions noted   Current use of insulin ([place]) 05/23/2014   Depression    Diabetes mellitus    DIABETES MELLITUS, TYPE II, UNCONTROLLED 07/30/2009   Qualifier: Diagnosis of  By: Amil Amen MD, [name]     Diabetes type 2, uncontrolled 05/23/2014   Diabetic gastroparesis associated with type 1 diabetes mellitus ([place]) 11/27/2016   DIABETIC PERIPHERAL NEUROPATHY 09/21/2009   Qualifier: Diagnosis of  By: Amil Amen MD, [name]     Diabetic peripheral neuropathy ([place]) 01/23/2016   Dysuria 12/25/2009   Qualifier: Diagnosis of  By: Amil Amen MD, [name]     Elevation of level of transaminase or lactic acid dehydrogenase (LDH) 09/21/2009   Formatting of this note might be different from the original. Overview:  Qualifier: Diagnosis of  By: Amil Amen MD, [place]   Essential hypertension 08/30/2009   Qualifier: Diagnosis of  By: Lovette Cliche, CNA, Christy     Gastric atony 07/30/2009   Formatting of this note might be different from the original. Overview:  Qualifier: Diagnosis of  By: Amil Amen MD, [place]   Gastroesophageal reflux disease 07/30/2009   Formatting of this note might be different from the original. Overview:  Overview:  Qualifier: Diagnosis of  By: Amil Amen MD, Elby Showers: Diagnosis of  By: Chester Holstein NP, Ellwood Handler of this note might be  different from the original. Overview:  Qualifier: Diagnosis of  By: Amil Amen MD, Elby Showers: Diagnosis of  By: Chester Holstein NP, Nevin Bloodgood   Gastroparesis 07/30/2009   Qualifier: Diagnosis of  By: Amil Amen MD, [place]     GERD 07/30/2009   Qualifier: Diagnosis of  By: Amil Amen MD, Elby Showers: Diagnosis of  By: Chester Holstein NP, Nevin Bloodgood     GERD (gastroesophageal reflux disease)    History of cervical cancer 08/17/2015   Status post partial hysterectomy  Formatting of this note  might be different from the original. Status post partial hysterectomy   HLD (hyperlipidemia) 07/30/2009   Qualifier: Diagnosis of  By: Amil Amen MD, [name]     Hyperlipidemia    Hyperlipidemia, unspecified 07/30/2009   Formatting of this note might be different from the original. Overview:  Qualifier: Diagnosis of  By: Amil Amen MD, Winona Legato of this note might be different from the original. Overview:  Qualifier: Diagnosis of  By: Amil Amen MD, [name]   Hypertension    INSOMNIA 09/21/2009   Qualifier: Diagnosis of  By: Amil Amen MD, [name]     Lumbar facet arthropathy 05/14/2016   Lumbar spinal stenosis 02/11/2018   Metatarsalgia of both feet 03/11/2017   Migraine 09/13/2012   Overview:  IMPRESSION: possible abd migraine   Mixed dyslipidemia 02/01/2019   Nausea & vomiting 07/07/2017   Nausea with vomiting, unspecified 09/14/2009   Qualifier: Diagnosis of  By: Shane Crutch, [name]   Formatting of this note might be different from the original. Overview:  Qualifier: Diagnosis of  By: Shane Crutch, [name]   Neuropathy    Nonspecific abnormal findings on radiological and examination of skull and head 09/13/2012   Overweight (BMI 25.0-29.9) 07/21/2014   POSITIVE PPD 07/30/2009   Annotation: CXR clear ?  diagnosed in 2/11?  On INH/pyridoxine Qualifier: Diagnosis of  By: Amil Amen MD, [name]     Pure hypercholesterolemia 11/27/2016   Retention of urine 06/25/2011   Right flank pain    TOBACCO ABUSE 09/21/2009   Qualifier: Diagnosis of  By: Amil Amen MD, Winona Legato of this note might be different from the original. Overview:  Qualifier: Diagnosis of  By: Amil Amen MD, Elinor Parkinson, SERUM, ELEVATED 09/21/2009   Qualifier: Diagnosis of  By: Amil Amen MD, [name]     Trigeminal neuralgia    Tuberculin test reaction 07/30/2009   Formatting of this note might be different from the original. Overview:  Annotation: CXR clear ?  diagnosed in 2/11?  On INH/pyridoxine  Qualifier: Diagnosis of  By: Amil Amen MD, [name]   Type 2 diabetes mellitus with hyperglycemia ([place]) 07/30/2009   Qualifier: Diagnosis of  By: Amil Amen MD, Winona Legato of this note might be different from the original. Overview:  Qualifier: Diagnosis of  By: Amil Amen MD, [name]   Type 2 diabetes mellitus with hyperglycemia, with long-term current use of insulin ([place]) 07/30/2009   Formatting of this note might be different from the original. Overview:  05/30/2015 A1C was 16.7% (!)  Overview:  05/30/2015 A1C was 16.7% (!) Formatting of this note might be different from the original. 05/30/2015 A1C was 16.7% (!)   Type 2 diabetes mellitus with stage 3 chronic kidney disease ([place]) 09/14/2009   Qualifier: Diagnosis of  By: Shane Crutch, [name]    ULCER-GASTRIC 09/28/2009   Qualifier: Diagnosis of  By: Chester Holstein NP, Paula     URINARY TRACT INFECTION 09/21/2009   Qualifier: Diagnosis of  By: Amil Amen MD, Benjamine Mola  VAGINITIS, CANDIDAL 12/25/2009   Qualifier: Diagnosis of  By: Amil Amen MD, Tessa Lerner D deficiency 04/25/2013   Patient Active Problem List   Diagnosis Date Noted   Sepsis ([place]) 05/21/2021   Chronic diastolic HF (heart failure) ([place]) 05/21/2021   Abscess of left lower leg    Necrotizing fasciitis ([place])    Cellulitis of left leg 04/22/2021   Sepsis due to cellulitis ([place]) 04/22/2021   Severe sepsis ([place]) 04/22/2021   Acute kidney injury superimposed on chronic kidney disease ([place]) 04/22/2021   Chest pain, unspecified 04/22/2021   Uncontrolled type 2 diabetes mellitus with hyperglycemia, without long-term current use of insulin ([place]) 04/22/2021   Pain of left calf 04/19/2021   Viral upper respiratory tract infection 11/30/2020   Trigeminal neuralgia    Cervical cancer ([facility])    Chronic back pain    Coronary artery disease    GERD (gastroesophageal reflux disease)    Hyperlipidemia    Hypertension    Dyslipidemia 02/01/2019   Cardiac murmur 02/01/2019    Lumbar spinal stenosis 02/11/2018   Neuropathy 07/20/2017   Right flank pain    Nausea & vomiting 07/07/2017   Acute cystitis 07/07/2017   CAD (coronary artery disease) 07/07/2017   Metatarsalgia of both feet 03/11/2017   Diabetic gastroparesis associated with type 1 diabetes mellitus ([place]) 11/27/2016   Pure hypercholesterolemia 11/27/2016   Lumbar facet arthropathy 05/14/2016   Chronic pain syndrome 01/23/2016   Diabetic peripheral neuropathy ([place]) 01/23/2016   Anxiety 01/22/2016   History of cervical cancer 08/17/2015   Depression 08/10/2015   Overweight (BMI 25.0-29.9) 07/21/2014   Chronic kidney disease, stage III (moderate) ([place]) 05/23/2014   Diabetes type 2, uncontrolled 05/23/2014   Current use of insulin ([place]) 05/23/2014   Vitamin D deficiency 04/25/2013   Cerebrovascular disease 09/13/2012   Migraine 09/13/2012   Nonspecific abnormal findings on radiological and examination of skull and head 09/13/2012   Retention of urine 06/25/2011   ANKLE PAIN, LEFT 12/25/2009   DYSURIA 12/25/2009   VAGINITIS, CANDIDAL 12/25/2009   ULCER-GASTRIC 09/28/2009   DIABETIC PERIPHERAL NEUROPATHY 09/21/2009   TOBACCO ABUSE 09/21/2009   UTI (urinary tract infection) 09/21/2009   INSOMNIA 09/21/2009   TRANSAMINASES, SERUM, ELEVATED 09/21/2009   Elevation of level of transaminase or lactic acid dehydrogenase (LDH) 09/21/2009   Type 2 diabetes mellitus with stage 3 chronic kidney disease ([place]) 09/14/2009   Nausea with vomiting, unspecified 09/14/2009   ABDOMINAL PAIN-EPIGASTRIC 09/14/2009   Cardiomyopathy, secondary ([place]) 09/06/2009   Cardiomyopathy, unspecified ([place]) 09/06/2009   Essential hypertension 08/30/2009   DM2 (diabetes mellitus, type 2) ([place]) 07/30/2009   HLD (hyperlipidemia) 07/30/2009   GERD 07/30/2009   GASTROPARESIS 07/30/2009   CHEST PAIN, ATYPICAL 07/30/2009   POSITIVE PPD 07/30/2009   Type 2 diabetes mellitus with hyperglycemia ([place]) 07/30/2009   Gastric atony  07/30/2009   Gastroesophageal reflux disease 07/30/2009   Hyperlipidemia, unspecified 07/30/2009   Tuberculin test reaction 07/30/2009   Past Surgical History:  Procedure Laterality Date   ABDOMINAL HYSTERECTOMY     partial   CHOLECYSTECTOMY     I & D EXTREMITY Left 04/24/2021   Procedure: LEFT LEG DEBRIDEMENT;  Surgeon: [name], MD;  Location: [facility];  Service: Orthopedics;  Laterality: Left;   I & D EXTREMITY Left 05/01/2021   Procedure: LEFT KNEE DEBRIDEMENT;  Surgeon: [name], MD;  Location: [facility];  Service: Orthopedics;  Laterality: Left;   OB History   No obstetric history on file.  Home Medications    Prior to Admission medications   Medication Sig Start Date End Date Taking? Authorizing Provider  albuterol (VENTOLIN HFA) 108 (90 Base) MCG/ACT inhaler Inhale 2 puffs into the lungs every 6 (six) hours as needed for wheezing or shortness of breath. 08/03/20   Saguier, Percell Miller, PA-C  amLODipine (NORVASC) 5 MG tablet Take 1 tablet (5 mg total) by mouth daily. 06/06/21 06/01/22  Saguier, Percell Miller, PA-C  aspirin 81 MG tablet Take 81 mg by mouth daily.    [provider]  atorvastatin (LIPITOR) 80 MG tablet Take 1 tablet (80 mg total) by mouth daily. 08/21/21   Saguier, Percell Miller, PA-C  azelastine (ASTELIN) 0.1 % nasal spray Place 2 sprays into both nostrils 2 (two) times daily. 04/09/20   Colon [place], MD  busPIRone (BUSPAR) 15 MG tablet Take 1 tablet (15 mg total) by mouth 2 (two) times daily. 08/21/21   Saguier, Percell Miller, PA-C  carvedilol (COREG) 25 MG tablet Take 1 tablet (25 mg total) by mouth 2 (two) times daily with a meal. 07/23/21   Saguier, Percell Miller, PA-C  ciprofloxacin (CIPRO) 500 MG tablet Take 1 tablet (500 mg total) by mouth 2 (two) times daily. 08/02/21   Saguier, Percell Miller, PA-C  doxycycline (VIBRA-TABS) 100 MG tablet Take 1 tablet (100 mg total) by mouth 2 (two) times daily. 09/13/21   [name], MD  Dulaglutide (TRULICITY) 5.40 JW/1.1BJ SOPN Inject 0.75 mg into  the skin once a week. 09/06/21   Saguier, Percell Miller, PA-C  famotidine (PEPCID) 20 MG tablet Take 1 tablet (20 mg total) by mouth daily. 04/03/21   Saguier, Percell Miller, PA-C  fluticasone (FLONASE) 50 MCG/ACT nasal spray Place 2 sprays into both nostrils daily. 09/27/19   Saguier, Percell Miller, PA-C  fluticasone furoate-vilanterol (BREO ELLIPTA) 100-25 MCG/INH AEPB Inhale 1 puff into the lungs daily. 08/03/20   Saguier, Percell Miller, PA-C  gabapentin (NEURONTIN) 300 MG capsule Take 2 capsules (600 mg total) by mouth 3 (three) times daily. 09/30/21   Saguier, Percell Miller, PA-C  insulin glargine-yfgn (SEMGLEE) 100 UNIT/ML Pen Inject 40 Units into the skin at bedtime. 06/14/21   Saguier, Percell Miller, PA-C  meloxicam (MOBIC) 7.5 MG tablet Take 7.5 mg by mouth 2 (two) times daily.    [provider]  metFORMIN (GLUCOPHAGE-XR) 500 MG 24 hr tablet Take 1 tablet (500 mg total) by mouth daily with breakfast. Patient taking differently: Take 500 mg by mouth 2 (two) times daily. 01/15/21   Saguier, Percell Miller, PA-C  methocarbamol (ROBAXIN) 750 MG tablet Take 1 tablet (750 mg total) by mouth every 8 (eight) hours as needed for muscle spasms. 08/29/21   Saguier, Percell Miller, PA-C  nitroGLYCERIN (NITROSTAT) 0.4 MG SL tablet Place 1 tablet (0.4 mg total) under the tongue every 5 (five) minutes as needed for chest pain. 03/21/21   Saguier, Percell Miller, PA-C  oxyCODONE (OXY IR/ROXICODONE) 5 MG immediate release tablet 1 tab po bid prn severe pain 08/21/21   Saguier, Percell Miller, PA-C  phenazopyridine (PYRIDIUM) 100 MG tablet Take 1 tablet (100 mg total) by mouth 3 (three) times daily as needed for pain. 07/26/21   Shelda Pal, DO  promethazine (PHENERGAN) 12.5 MG tablet Take 1 tablet (12.5 mg total) by mouth every 8 (eight) hours as needed for nausea or vomiting. 08/21/21   Saguier, Percell Miller, PA-C  sertraline (ZOLOFT) 100 MG tablet Take 1 tablet (100 mg total) by mouth daily. 08/21/21   Saguier, Percell Miller, PA-C  topiramate (TOPAMAX) 50 MG tablet Take 1 tablet (50 mg  total) by mouth  2 (two) times daily. Patient taking differently: Take 50 mg by mouth 2 (two) times daily as needed (headache). 04/17/20   Penumalli, Earlean Polka, MD    Family History Family History  Problem Relation Age of Onset   Diabetes Mother    Diabetes Father    Social History Social History   Tobacco Use   Smoking status: Never   Smokeless tobacco: Never  Vaping Use   Vaping Use: Never used  Substance Use Topics   Alcohol use: Yes    Comment: occasionally   Drug use: No   Allergies   Insulin aspart, Latex, and Morphine  Review of Systems Review of Systems Pertinent findings revealed after performing a 14 point review of systems has been noted in the history of present illness.  Physical Exam Triage Vital Signs ED Triage Vitals  Enc Vitals Group     BP 12/07/20 0827 (!) 147/82     Pulse Rate 12/07/20 0827 72     Resp 12/07/20 0827 18     Temp 12/07/20 0827 98.3 F (36.8 C)     Temp Source 12/07/20 0827 Oral     SpO2 12/07/20 0827 98 %     Weight --      Height --      Head Circumference --      Peak Flow --      Pain Score 12/07/20 0826 5     Pain Loc --      Pain Edu? --      Excl. in [place]? --   No data found.  Updated Vital Signs BP (!) 137/96 (BP Location: Right Arm)   Pulse 93   Temp 98.2 F (36.8 C) (Oral)   Resp 20   SpO2 96%   Physical Exam Vitals and nursing note reviewed.  Constitutional:      General: She is not in acute distress.    Appearance: Normal appearance. She is not ill-appearing.  HENT:     Head: Normocephalic and atraumatic.     Salivary Glands: Right salivary gland is not diffusely enlarged or tender. Left salivary gland is not diffusely enlarged or tender.     Right Ear: Ear canal and external ear normal. No drainage. A middle ear effusion is present. There is no impacted cerumen. Tympanic membrane is bulging. Tympanic membrane is not injected or erythematous.     Left Ear: Ear canal and external ear normal. No drainage. A  middle ear effusion is present. There is no impacted cerumen. Tympanic membrane is bulging. Tympanic membrane is not injected or erythematous.     Ears:     Comments: Bilateral EACs normal, both TMs bulging with clear fluid    Nose: Rhinorrhea present. No nasal deformity, septal deviation, signs of injury, nasal tenderness, mucosal edema or congestion. Rhinorrhea is clear.     Right Nostril: Occlusion present. No foreign body, epistaxis or septal hematoma.     Left Nostril: Occlusion present. No foreign body, epistaxis or septal hematoma.     Right Turbinates: Enlarged and swollen. Not pale.     Left Turbinates: Enlarged and swollen. Not pale.     Right Sinus: No maxillary sinus tenderness or frontal sinus tenderness.     Left Sinus: No maxillary sinus tenderness or frontal sinus tenderness.     Mouth/Throat:     Lips: Pink. No lesions.     Mouth: Mucous membranes are moist. No oral lesions.     Pharynx: Oropharynx is clear. Uvula midline. No posterior  oropharyngeal erythema or uvula swelling.     Tonsils: No tonsillar exudate. 0 on the right. 0 on the left.     Comments: Postnasal drip Eyes:     General: Lids are normal.        Right eye: No discharge.        Left eye: No discharge.     Extraocular Movements: Extraocular movements intact.     Conjunctiva/sclera: Conjunctivae normal.     Right eye: Right conjunctiva is not injected.     Left eye: Left conjunctiva is not injected.  Neck:     Trachea: Trachea and phonation normal.  Cardiovascular:     Rate and Rhythm: Normal rate and regular rhythm.     Pulses: Normal pulses.     Heart sounds: Normal heart sounds. No murmur heard.    No friction rub. No gallop.  Pulmonary:     Effort: Pulmonary effort is normal. No tachypnea, bradypnea, accessory muscle usage, prolonged expiration, respiratory distress or retractions.     Breath sounds: Normal breath sounds. No stridor, decreased air movement or transmitted upper airway sounds. No  decreased breath sounds, wheezing, rhonchi or rales.  Chest:     Chest wall: No tenderness.  Musculoskeletal:        General: Normal range of motion.     Cervical back: Full passive range of motion without pain, normal range of motion and neck supple. Normal range of motion.  Lymphadenopathy:     Cervical: No cervical adenopathy.  Skin:    General: Skin is warm and dry.     Findings: No erythema or rash.  Neurological:     General: No focal deficit present.     Mental Status: She is alert and oriented to person, place, and time.  Psychiatric:        Mood and Affect: Mood normal.        Behavior: Behavior normal.     Visual Acuity Right Eye Distance:   Left Eye Distance:   Bilateral Distance:    Right Eye Near:   Left Eye Near:    Bilateral Near:     [facility] / Diagnostics / Procedures:     Radiology No results found.  Procedures Procedures (including critical care time) EKG  Pending results:  Labs Reviewed  RESP PANEL BY RT-PCR (FLU A&B, COVID) [id number]    Medications Ordered in UC: Medications - No data to display  UC Diagnoses / Final Clinical Impressions(s)   I have reviewed the triage vital signs and the nursing notes.  Pertinent labs & imaging results that were available during my care of the patient were reviewed by me and considered in my medical decision making (see chart for details).    Final diagnoses:  Viral illness   COVID-19 and influenza testing performed, patient would not benefit from antiviral treatment if either test is positive.  Supportive medications recommended.  Conservative care recommended.  Return precautions advised.  ED Prescriptions   None    PDMP not reviewed this encounter.  Disposition Upon Discharge:  Condition: stable for discharge home Home: take medications as prescribed; routine discharge instructions as discussed; follow up as advised.  Patient presented with an acute illness with associated systemic symptoms and  significant discomfort requiring urgent management. In my opinion, this is a condition that a prudent lay person (someone who possesses an average knowledge of health and medicine) may potentially expect to result in complications if not addressed urgently such as respiratory distress, impairment  of bodily function or dysfunction of bodily organs.   Routine symptom specific, illness specific and/or disease specific instructions were discussed with the patient and/or caregiver at length.   As such, the patient has been evaluated and assessed, work-up was performed and treatment was provided in alignment with urgent care protocols and evidence based medicine.  Patient/parent/caregiver has been advised that the patient may require follow up for further testing and treatment if the symptoms continue in spite of treatment, as clinically indicated and appropriate.  If the patient was tested for COVID-19, Influenza and/or RSV, then the patient/parent/guardian was advised to isolate at home pending the results of his/her diagnostic coronavirus test and potentially longer if they're positive. I have also advised pt that if his/her COVID-19 test returns positive, it's recommended to self-isolate for at least 10 days after symptoms first appeared AND until fever-free for 24 hours without fever reducer AND other symptoms have improved or resolved. Discussed self-isolation recommendations as well as instructions for household member/close contacts as per the [facility] and [place] [facility]HS, and also gave patient the [facility] packet with this information.  Patient/parent/caregiver has been advised to return to the [facility] or PCP in 3-5 days if no better; to PCP or the Emergency Department if new signs and symptoms develop, or if the current signs or symptoms continue to change or worsen for further workup, evaluation and treatment as clinically indicated and appropriate  The patient will follow up with their current PCP if and as advised. If  the patient does not currently have a PCP we will assist them in obtaining one.   The patient may need specialty follow up if the symptoms continue, in spite of conservative treatment and management, for further workup, evaluation, consultation and treatment as clinically indicated and appropriate.  Patient/parent/caregiver verbalized understanding and agreement of plan as discussed.  All questions were addressed during visit.  Please see discharge instructions below for further details of plan.  Discharge Instructions:   Discharge Instructions      Your symptoms and physical exam findings are concerning for a viral respiratory infection.  The result of your viral PCR testing for COVID-19 and influenza will be posted to your MyChart once it is complete, typically this takes 6 to 12 hours.    If your COVID-19 PCR test is positive, you will be contacted by phone.  Because you do not have a history of being immune compromised, you are currently vaccinated for COVID-19, you are under the age of 36, you do not have a risk of severe disease due to COVID-19.  Antiviral treatment is not indicated.  If your influenza PCR test is positive, you will be contacted by phone.  Due to the duration of your symptoms, you would no longer benefit from antiviral therapy for influenza.     Please see the list below for recommended medications, dosages and frequencies to provide relief of your current symptoms:     Advil, Motrin (ibuprofen): This is a good anti-inflammatory medication which addresses aches, pains and inflammation of the upper airways that causes sinus and nasal congestion as well as in the lower airways which makes your cough feel tight and sometimes burn.  I recommend that you take between 400 to 600 mg every 6-8 hours as needed.      Tylenol (acetaminophen): This is a good fever reducer.  If your body temperature rises above 101.5 as measured with a thermometer, it is recommended that you take  1,000 mg every 8 hours until  your temperature falls below 101.5, please not take more than 3,000 mg of acetaminophen either as a separate medication or as in ingredient in an over-the-counter cold/flu preparation within a 24-hour period.      Sudafed (pseudoephedrine): This is a decongestant.  This medication has to be purchased from the pharmacist counter, I recommend taking 2 tablets, 60 mg, 2-3 times a day as needed to relieve runny nose and sinus drainage.  This medication is available over-the-counter however I have sent a prescription for your convenience.   Robitussin, Mucinex (guaifenesin): This is an expectorant.  This helps break up chest congestion and loosen up thick nasal drainage making phlegm and drainage more liquid and therefore easier to remove.  I recommend being 400 mg three times daily as needed.      Dextromethorphan (any cough medicine with the letters "DM" added to it's name such as Robitussin DM): This is a cough suppressant.  This is often recommended to be taken at nighttime to suppress cough and help children sleep.  Give dosage as directed on the bottle.  This medication is available over-the-counter.   Chloraseptic Throat Spray: Spray 5 sprays into affected area every 2 hours, hold for 15 seconds and either swallow or spit it out.  This is a excellent numbing medication because it is a spray, you can put it right where you needed and so sucking on a lozenge and numbing your entire mouth.      Please follow-up within the next 5-7 days either with your primary care provider or urgent care if your symptoms do not resolve.  If you do not have a primary care provider, we will assist you in finding one.   Thank you for visiting urgent care today.  We appreciate the opportunity to participate in your care.       This office note has been dictated using Museum/gallery curator.  Unfortunately, this method of dictation can sometimes lead to typographical or  grammatical errors.  I apologize for your inconvenience in advance if this occurs.  Please do not hesitate to reach out to me if clarification is needed.      Lynden Oxford Scales, PA-C 10/20/21 1829

## 2021-10-20 NOTE — ED Triage Notes (Signed)
C/O headache, nausea and diarrhea, head congestion and cough. Denies known fever.

## 2021-10-20 NOTE — Discharge Instructions (Addendum)
Your symptoms and physical exam findings are concerning for a viral respiratory infection.  The result of your viral PCR testing for COVID-19 and influenza will be posted to your MyChart once it is complete, typically this takes 6 to 12 hours.    If your COVID-19 PCR test is positive, you will be contacted by phone.  Because you do not have a history of being immune compromised, you are currently vaccinated for COVID-19, you are under the age of 7, you do not have a risk of severe disease due to COVID-19.  Antiviral treatment is not indicated.  If your influenza PCR test is positive, you will be contacted by phone.  Due to the duration of your symptoms, you would no longer benefit from antiviral therapy for influenza.     Please see the list below for recommended medications, dosages and frequencies to provide relief of your current symptoms:     Advil, Motrin (ibuprofen): This is a good anti-inflammatory medication which addresses aches, pains and inflammation of the upper airways that causes sinus and nasal congestion as well as in the lower airways which makes your cough feel tight and sometimes burn.  I recommend that you take between 400 to 600 mg every 6-8 hours as needed.      Tylenol (acetaminophen): This is a good fever reducer.  If your body temperature rises above 101.5 as measured with a thermometer, it is recommended that you take 1,000 mg every 8 hours until your temperature falls below 101.5, please not take more than 3,000 mg of acetaminophen either as a separate medication or as in ingredient in an over-the-counter cold/flu preparation within a 24-hour period.      Sudafed (pseudoephedrine): This is a decongestant.  This medication has to be purchased from the pharmacist counter, I recommend taking 2 tablets, 60 mg, 2-3 times a day as needed to relieve runny nose and sinus drainage.  This medication is available over-the-counter however I have sent a prescription for your convenience.    Robitussin, Mucinex (guaifenesin): This is an expectorant.  This helps break up chest congestion and loosen up thick nasal drainage making phlegm and drainage more liquid and therefore easier to remove.  I recommend being 400 mg three times daily as needed.      Dextromethorphan (any cough medicine with the letters "DM" added to it's name such as Robitussin DM): This is a cough suppressant.  This is often recommended to be taken at nighttime to suppress cough and help children sleep.  Give dosage as directed on the bottle.  This medication is available over-the-counter.   Chloraseptic Throat Spray: Spray 5 sprays into affected area every 2 hours, hold for 15 seconds and either swallow or spit it out.  This is a excellent numbing medication because it is a spray, you can put it right where you needed and so sucking on a lozenge and numbing your entire mouth.      Please follow-up within the next 5-7 days either with your primary care provider or urgent care if your symptoms do not resolve.  If you do not have a primary care provider, we will assist you in finding one.   Thank you for visiting urgent care today.  We appreciate the opportunity to participate in your care.

## 2021-10-21 ENCOUNTER — Telehealth: Payer: Self-pay | Admitting: Medical

## 2021-10-21 ENCOUNTER — Encounter: Payer: Self-pay | Admitting: Medical

## 2021-10-21 MED ORDER — MOLNUPIRAVIR EUA 200MG CAPSULE: 4.0000 | ORAL_CAPSULE | Freq: Two times a day (BID) | ORAL | 0 refills | Status: AC

## 2021-10-21 NOTE — Telephone Encounter (Signed)
Pcp does not have anything until 9/12. Pt was not sure why [facility] wasn't able to send rx in.

## 2021-10-21 NOTE — Telephone Encounter (Signed)
Pt tested + for covid at [facility] yesterday and would like some medication sent in.   [place], [place].   [address] [place] [address]  Phone:  [phone]  Fax:  [phone]

## 2021-10-21 NOTE — Telephone Encounter (Signed)
Please advise   [facility] notes in epic as well

## 2021-10-21 NOTE — Telephone Encounter (Signed)
Patient needs an appointment

## 2021-10-21 NOTE — Telephone Encounter (Signed)
Rx molnupiravir sent to patient's pharmacy.

## 2021-10-23 NOTE — Telephone Encounter (Signed)
Pt called and she stated she has picked up medication

## 2021-11-13 ENCOUNTER — Ambulatory Visit (INDEPENDENT_AMBULATORY_CARE_PROVIDER_SITE_OTHER): Payer: Commercial Managed Care - HMO | Admitting: Family

## 2021-11-13 ENCOUNTER — Ambulatory Visit (INDEPENDENT_AMBULATORY_CARE_PROVIDER_SITE_OTHER): Payer: Commercial Managed Care - HMO

## 2021-11-13 DIAGNOSIS — M79672 Pain in left foot: Secondary | ICD-10-CM

## 2021-11-13 DIAGNOSIS — L97522 Non-pressure chronic ulcer of other part of left foot with fat layer exposed: Secondary | ICD-10-CM

## 2021-11-13 MED ORDER — AMOXICILLIN-POT CLAVULANATE 500-125 MG PO TABS: 1.0000 | ORAL_TABLET | Freq: Three times a day (TID) | ORAL | 0 refills | Status: DC

## 2021-11-13 MED ORDER — MUPIROCIN 2 % EX OINT: 1.0000 | TOPICAL_OINTMENT | Freq: Two times a day (BID) | CUTANEOUS | 0 refills | Status: DC

## 2021-11-13 MED ORDER — OXYCODONE HCL 5 MG PO TABS: 5.0000 mg | ORAL_TABLET | Freq: Four times a day (QID) | ORAL | 0 refills | Status: DC | PRN

## 2021-11-13 NOTE — Progress Notes (Signed)
Office Visit Note   Patient: [name]           Date of Birth: 1965-05-10           MRN: [id number] Visit Date: 11/13/2021              Requested by: Mackie Pai, PA-C [place],  [place] [address] PCP: Mackie Pai, PA-C  Chief Complaint  Patient presents with   Left Foot - Pain      HPI: Patient is a 56 year old woman seen today in follow-up for ongoing left foot swelling and pain to the third toe this has become much more painful.  She did have radiographs in May of this year which were unrevealing.  Assessment & Plan: Visit Diagnoses:  1. Left foot pain   2. Ulcer of left foot, with fat layer exposed ([place])     Plan: Placed on a course of Augmentin she will continue daily dose of cleansing mupirocin dressing changes.  We will proceed with MRI of the left foot concern for osteomyelitis of the third metatarsal head.  Follow-Up Instructions: No follow-ups on file.   Ortho Exam  Patient is alert, oriented, no adenopathy, well-dressed, normal affect, normal respiratory effort. On examination of the left foot she does have a palpable dorsalis pedis pulse.  Ulcer beneath the second metatarsal head after informed consent 10 blade knife was used to debride the skin and soft tissue back to healthy viable granulation tissue.  Was probed with silver nitrate.  Today does not probe to bone or tendon.  Ulcer 3 cm in diameter 5 mm deep.  Imaging: XR Toe 3rd Left  Result Date: 11/13/2021 Radiographs of left 3rd toe show surgically absent 3rd MT head. Silver nitrate probing of the wound seen. No lytic lesion seen.  No images are attached to the encounter.  Labs: Lab Results  Component Value Date   HGBA1C 9.8 (H) 08/26/2021   HGBA1C 12.3 (H) 04/23/2021   HGBA1C 7.9 (H) 08/24/2020   ESRSEDRATE 41 (H) 08/24/2020   REPTSTATUS 05/23/2021 FINAL 05/21/2021   GRAMSTAIN  05/01/2021    FEW WBC PRESENT,BOTH PMN AND MONONUCLEAR NO ORGANISMS SEEN     CULT >=100,000 COLONIES/mL SERRATIA MARCESCENS (A) 05/21/2021   LABORGA SERRATIA MARCESCENS (A) 05/21/2021     Lab Results  Component Value Date   ALBUMIN 4.0 08/26/2021   ALBUMIN 3.9 05/28/2021   ALBUMIN 3.1 (L) 05/22/2021    Lab Results  Component Value Date   MG 1.8 07/11/2017   No results found for: "VD25OH"  No results found for: "PREALBUMIN"    Latest Ref Rng & Units 08/26/2021    3:04 PM 06/14/2021   11:39 AM 05/28/2021   12:55 PM  CBC EXTENDED  WBC 4.0 - 10.5 K/uL 7.3  4.6  7.0   RBC 3.87 - 5.11 Mil/uL 4.21  4.33  3.79   Hemoglobin 12.0 - 15.0 g/dL 12.3  12.9  11.2   HCT 36.0 - 46.0 % 37.1  40.3  34.3   Platelets 150.0 - 400.0 K/uL 260.0  327  278.0   NEUT# 1.4 - 7.7 K/uL 3.3  1,504  3.1   Lymph# 0.7 - 4.0 K/uL 2.6  2,245  2.7      There is no height or weight on file to calculate BMI.  Orders:  Orders Placed This Encounter  Procedures   XR Toe 3rd Left   MR Foot Left w/o contrast   Meds ordered  this encounter  Medications   amoxicillin-clavulanate (AUGMENTIN) 500-125 MG tablet    Sig: Take 1 tablet (500 mg total) by mouth 3 (three) times daily.    Dispense:  30 tablet    Refill:  0   oxyCODONE (OXY IR/ROXICODONE) 5 MG immediate release tablet    Sig: Take 1 tablet (5 mg total) by mouth every 6 (six) hours as needed for severe pain. 1 tab po bid prn severe pain    Dispense:  28 tablet    Refill:  0   mupirocin ointment (BACTROBAN) 2 %    Sig: Apply 1 Application topically 2 (two) times daily.    Dispense:  22 g    Refill:  0     Procedures: No procedures performed  Clinical Data: No additional findings.  ROS:  All other systems negative, except as noted in the HPI. Review of Systems  Objective: Vital Signs: There were no vitals taken for this visit.  Specialty Comments:  No specialty comments available.  PMFS History: Patient Active Problem List   Diagnosis Date Noted   Sepsis ([place]) 05/21/2021   Chronic diastolic HF (heart  failure) ([place]) 05/21/2021   Abscess of left lower leg    Necrotizing fasciitis ([place])    Cellulitis of left leg 04/22/2021   Sepsis due to cellulitis ([place]) 04/22/2021   Severe sepsis ([place]) 04/22/2021   Acute kidney injury superimposed on chronic kidney disease ([place]) 04/22/2021   Chest pain, unspecified 04/22/2021   Uncontrolled type 2 diabetes mellitus with hyperglycemia, without long-term current use of insulin ([place]) 04/22/2021   Pain of left calf 04/19/2021   Viral upper respiratory tract infection 11/30/2020   Trigeminal neuralgia    Cervical cancer (HCC)    Chronic back pain    Coronary artery disease    GERD (gastroesophageal reflux disease)    Hyperlipidemia    Hypertension    Dyslipidemia 02/01/2019   Cardiac murmur 02/01/2019   Lumbar spinal stenosis 02/11/2018   Neuropathy 07/20/2017   Right flank pain    Nausea & vomiting 07/07/2017   Acute cystitis 07/07/2017   CAD (coronary artery disease) 07/07/2017   Metatarsalgia of both feet 03/11/2017   Diabetic gastroparesis associated with type 1 diabetes mellitus ([place]) 11/27/2016   Pure hypercholesterolemia 11/27/2016   Lumbar facet arthropathy 05/14/2016   Chronic pain syndrome 01/23/2016   Diabetic peripheral neuropathy ([place]) 01/23/2016   Anxiety 01/22/2016   History of cervical cancer 08/17/2015   Depression 08/10/2015   Overweight (BMI 25.0-29.9) 07/21/2014   Chronic kidney disease, stage III (moderate) ([place]) 05/23/2014   Diabetes type 2, uncontrolled 05/23/2014   Current use of insulin ([place]) 05/23/2014   Vitamin D deficiency 04/25/2013   Cerebrovascular disease 09/13/2012   Migraine 09/13/2012   Nonspecific abnormal findings on radiological and examination of skull and head 09/13/2012   Retention of urine 06/25/2011   ANKLE PAIN, LEFT 12/25/2009   DYSURIA 12/25/2009   VAGINITIS, CANDIDAL 12/25/2009   ULCER-GASTRIC 09/28/2009   DIABETIC PERIPHERAL NEUROPATHY 09/21/2009   TOBACCO ABUSE 09/21/2009   UTI (urinary  tract infection) 09/21/2009   INSOMNIA 09/21/2009   TRANSAMINASES, SERUM, ELEVATED 09/21/2009   Elevation of level of transaminase or lactic acid dehydrogenase (LDH) 09/21/2009   Type 2 diabetes mellitus with stage 3 chronic kidney disease ([place]) 09/14/2009   Nausea with vomiting, unspecified 09/14/2009   ABDOMINAL PAIN-EPIGASTRIC 09/14/2009   Cardiomyopathy, secondary ([place]) 09/06/2009   Cardiomyopathy, unspecified ([place]) 09/06/2009   Essential hypertension 08/30/2009   DM2 (diabetes mellitus, type  2) ([place]) 07/30/2009   HLD (hyperlipidemia) 07/30/2009   GERD 07/30/2009   GASTROPARESIS 07/30/2009   CHEST PAIN, ATYPICAL 07/30/2009   POSITIVE PPD 07/30/2009   Type 2 diabetes mellitus with hyperglycemia ([place]) 07/30/2009   Gastric atony 07/30/2009   Gastroesophageal reflux disease 07/30/2009   Hyperlipidemia, unspecified 07/30/2009   Tuberculin test reaction 07/30/2009   Past Medical History:  Diagnosis Date   ABDOMINAL PAIN-EPIGASTRIC 09/14/2009   Qualifier: Diagnosis of  By: Trellis Paganini PA-c, [name]    Acute cystitis 07/07/2017   ANKLE PAIN, LEFT 12/25/2009   Qualifier: Diagnosis of  By: Amil Amen MD, [name]     Anxiety 01/22/2016   CAD (coronary artery disease) 07/07/2017   Cardiac murmur 02/01/2019   Cardiomyopathy, secondary ([place]) 09/06/2009   Qualifier: Diagnosis of  By: Arvid Right   Formatting of this note might be different from the original. Overview:  Qualifier: Diagnosis of  By: Arvid Right   Cardiomyopathy, unspecified ([place]) 09/06/2009   Formatting of this note might be different from the original. Overview:  Overview:  Qualifier: Diagnosis of  By: Arvid Right  Overview:  Overview:  Qualifier: Diagnosis of  By: Hyman Hopes of this note might be different from the original. Overview:  Qualifier: Diagnosis of  By: Arvid Right   Cerebrovascular disease 09/13/2012   Cervical cancer ([place])     had surgery in 2001.   CHEST PAIN, ATYPICAL 07/30/2009   Qualifier: Diagnosis of  By: Amil Amen MD, Winona Legato of this note might be different from the original. Overview:  Qualifier: Diagnosis of  By: Amil Amen MD, [name]   Chronic back pain    Chronic kidney disease, stage III (moderate) ([place]) 05/23/2014   Chronic pain syndrome 01/23/2016   Coronary artery disease    30% lesions noted   Current use of insulin ([place]) 05/23/2014   Depression    Diabetes mellitus    DIABETES MELLITUS, TYPE II, UNCONTROLLED 07/30/2009   Qualifier: Diagnosis of  By: Amil Amen MD, [name]     Diabetes type 2, uncontrolled 05/23/2014   Diabetic gastroparesis associated with type 1 diabetes mellitus ([place]) 11/27/2016   DIABETIC PERIPHERAL NEUROPATHY 09/21/2009   Qualifier: Diagnosis of  By: Amil Amen MD, [name]     Diabetic peripheral neuropathy ([place]) 01/23/2016   Dysuria 12/25/2009   Qualifier: Diagnosis of  By: Amil Amen MD, [name]     Elevation of level of transaminase or lactic acid dehydrogenase (LDH) 09/21/2009   Formatting of this note might be different from the original. Overview:  Qualifier: Diagnosis of  By: Amil Amen MD, [place]   Essential hypertension 08/30/2009   Qualifier: Diagnosis of  By: Lovette Cliche, CNA, Christy     Gastric atony 07/30/2009   Formatting of this note might be different from the original. Overview:  Qualifier: Diagnosis of  By: Amil Amen MD, [place]   Gastroesophageal reflux disease 07/30/2009   Formatting of this note might be different from the original. Overview:  Overview:  Qualifier: Diagnosis of  By: Amil Amen MD, Elby Showers: Diagnosis of  By: Chester Holstein NP, Ellwood Handler of this note might be different from the original. Overview:  Qualifier: Diagnosis of  By: Amil Amen MD, Elby Showers: Diagnosis of  By: Chester Holstein NP, Nevin Bloodgood   Gastroparesis 07/30/2009   Qualifier: Diagnosis of  By: Amil Amen MD, [name]     GERD 07/30/2009   Qualifier:  Diagnosis of  By: Amil Amen MD, Elby Showers: Diagnosis  of  By: Chester Holstein NP, Nevin Bloodgood     GERD (gastroesophageal reflux disease)    History of cervical cancer 08/17/2015   Status post partial hysterectomy  Formatting of this note might be different from the original. Status post partial hysterectomy   HLD (hyperlipidemia) 07/30/2009   Qualifier: Diagnosis of  By: Amil Amen MD, [name]     Hyperlipidemia    Hyperlipidemia, unspecified 07/30/2009   Formatting of this note might be different from the original. Overview:  Qualifier: Diagnosis of  By: Amil Amen MD, Winona Legato of this note might be different from the original. Overview:  Qualifier: Diagnosis of  By: Amil Amen MD, [name]   Hypertension    INSOMNIA 09/21/2009   Qualifier: Diagnosis of  By: Amil Amen MD, [name]     Lumbar facet arthropathy 05/14/2016   Lumbar spinal stenosis 02/11/2018   Metatarsalgia of both feet 03/11/2017   Migraine 09/13/2012   Overview:  IMPRESSION: possible abd migraine   Mixed dyslipidemia 02/01/2019   Nausea & vomiting 07/07/2017   Nausea with vomiting, unspecified 09/14/2009   Qualifier: Diagnosis of  By: Shane Crutch, [name]   Formatting of this note might be different from the original. Overview:  Qualifier: Diagnosis of  By: Shane Crutch, [name]   Neuropathy    Nonspecific abnormal findings on radiological and examination of skull and head 09/13/2012   Overweight (BMI 25.0-29.9) 07/21/2014   POSITIVE PPD 07/30/2009   Annotation: CXR clear ?  diagnosed in 2/11?  On INH/pyridoxine Qualifier: Diagnosis of  By: Amil Amen MD, [name]     Pure hypercholesterolemia 11/27/2016   Retention of urine 06/25/2011   Right flank pain    TOBACCO ABUSE 09/21/2009   Qualifier: Diagnosis of  By: Amil Amen MD, Winona Legato of this note might be different from the original. Overview:  Qualifier: Diagnosis of  By: Amil Amen MD, Elinor Parkinson, SERUM, ELEVATED 09/21/2009   Qualifier: Diagnosis of  By:  Amil Amen MD, [name]     Trigeminal neuralgia    Tuberculin test reaction 07/30/2009   Formatting of this note might be different from the original. Overview:  Annotation: CXR clear ?  diagnosed in 2/11?  On INH/pyridoxine Qualifier: Diagnosis of  By: Amil Amen MD, [name]   Type 2 diabetes mellitus with hyperglycemia ([place]) 07/30/2009   Qualifier: Diagnosis of  By: Amil Amen MD, Winona Legato of this note might be different from the original. Overview:  Qualifier: Diagnosis of  By: Amil Amen MD, [name]   Type 2 diabetes mellitus with hyperglycemia, with long-term current use of insulin ([place]) 07/30/2009   Formatting of this note might be different from the original. Overview:  05/30/2015 A1C was 16.7% (!)  Overview:  05/30/2015 A1C was 16.7% (!) Formatting of this note might be different from the original. 05/30/2015 A1C was 16.7% (!)   Type 2 diabetes mellitus with stage 3 chronic kidney disease ([place]) 09/14/2009   Qualifier: Diagnosis of  By: Shane Crutch, [name]    ULCER-GASTRIC 09/28/2009   Qualifier: Diagnosis of  By: Chester Holstein NP, Paula     URINARY TRACT INFECTION 09/21/2009   Qualifier: Diagnosis of  By: Amil Amen MD, Louanne Belton, CANDIDAL 12/25/2009   Qualifier: Diagnosis of  By: Amil Amen MD, Tessa Lerner D deficiency 04/25/2013    Family History  Problem Relation Age of Onset   Diabetes Mother    Diabetes Father     Past Surgical History:  Procedure Laterality Date   ABDOMINAL  HYSTERECTOMY     partial   CHOLECYSTECTOMY     I & D EXTREMITY Left 04/24/2021   Procedure: LEFT LEG DEBRIDEMENT;  Surgeon: [name], MD;  Location: [facility];  Service: Orthopedics;  Laterality: Left;   I & D EXTREMITY Left 05/01/2021   Procedure: LEFT KNEE DEBRIDEMENT;  Surgeon: [name], MD;  Location: [facility];  Service: Orthopedics;  Laterality: Left;   Social History   Occupational History   Not on file  Tobacco Use   Smoking status: Never   Smokeless tobacco: Never   Vaping Use   Vaping Use: Never used  Substance and Sexual Activity   Alcohol use: Yes    Comment: occasionally   Drug use: No   Sexual activity: Not on file

## 2021-11-19 ENCOUNTER — Telehealth: Payer: Self-pay | Admitting: Medical

## 2021-11-19 MED ORDER — METHOCARBAMOL 750 MG PO TABS: 750.0000 mg | ORAL_TABLET | Freq: Three times a day (TID) | ORAL | 0 refills | Status: DC | PRN

## 2021-11-19 MED ORDER — AMLODIPINE BESYLATE 5 MG PO TABS: 5.0000 mg | ORAL_TABLET | Freq: Every day | ORAL | 3 refills | Status: DC

## 2021-11-19 MED ORDER — PROMETHAZINE HCL 12.5 MG PO TABS: 12.5000 mg | ORAL_TABLET | Freq: Three times a day (TID) | ORAL | 1 refills | Status: DC | PRN

## 2021-11-19 MED ORDER — METFORMIN HCL ER 500 MG PO TB24: 500.0000 mg | ORAL_TABLET | Freq: Every day | ORAL | 2 refills | Status: DC

## 2021-11-19 NOTE — Telephone Encounter (Signed)
Rx sent 

## 2021-11-19 NOTE — Telephone Encounter (Signed)
Medication:  1.methocarbamol (ROBAXIN) 750 MG tablet  2.amLODipine (NORVASC) 5 MG tablet  3.metFORMIN (GLUCOPHAGE-XR) 500 MG 24 hr tablet  4.promethazine (PHENERGAN) 12.5 MG tablet    Has the patient contacted their pharmacy? No.  Preferred Pharmacy (with phone number or street name):  [place], [place]. [address] [place] [address] Phone: [phone]  Fax: [phone]

## 2021-11-28 ENCOUNTER — Telehealth: Payer: Self-pay | Admitting: Family

## 2021-11-28 ENCOUNTER — Other Ambulatory Visit: Payer: Self-pay | Admitting: Orthopedic Surgery

## 2021-11-28 MED ORDER — OXYCODONE HCL 5 MG PO TABS: 5.0000 mg | ORAL_TABLET | Freq: Four times a day (QID) | ORAL | 0 refills | Status: DC | PRN

## 2021-11-28 NOTE — Telephone Encounter (Signed)
Pt in office to see Junie Panning 11/13/21 dictation not available. Treated for foot ulcer. Asking for refill on Oxycodone. Last given 11/13/2021 #28 please advise.

## 2021-11-28 NOTE — Telephone Encounter (Signed)
Pt called requesting pain medication refill. Please send to pharmacy on file? Pt phone number is [phone].

## 2021-11-29 ENCOUNTER — Other Ambulatory Visit: Payer: Self-pay | Admitting: Orthopedic Surgery

## 2021-11-29 ENCOUNTER — Telehealth: Payer: Self-pay | Admitting: Orthopedic Surgery

## 2021-11-29 MED ORDER — OXYCODONE-ACETAMINOPHEN 5-325 MG PO TABS: 1.0000 | ORAL_TABLET | Freq: Four times a day (QID) | ORAL | 0 refills | Status: DC | PRN

## 2021-11-29 NOTE — Telephone Encounter (Signed)
Katrina Warehouse manager) called from [employer]  requesting a call back for clarification of when and how many times a day to take oxycodone. Please call Katrina back at [phone]

## 2021-11-29 NOTE — Telephone Encounter (Signed)
[facility] informed. Please resend new Rx just for q6h.

## 2021-11-29 NOTE — Telephone Encounter (Signed)
[facility] called due to two different sigs on oxycodone. States take q6h prn for severe pain. Take 1 tab BID prn for severe pain. How should she take this?

## 2021-11-30 ENCOUNTER — Inpatient Hospital Stay: Admission: RE | Admit: 2021-11-30 | Payer: Commercial Managed Care - HMO | Source: Ambulatory Visit

## 2021-12-02 ENCOUNTER — Ambulatory Visit (INDEPENDENT_AMBULATORY_CARE_PROVIDER_SITE_OTHER): Payer: Commercial Managed Care - HMO | Admitting: Orthopedic Surgery

## 2021-12-02 ENCOUNTER — Telehealth: Payer: Self-pay | Admitting: Orthopedic Surgery

## 2021-12-02 ENCOUNTER — Encounter: Payer: Self-pay | Admitting: Orthopedic Surgery

## 2021-12-02 ENCOUNTER — Telehealth: Payer: Self-pay

## 2021-12-02 DIAGNOSIS — L97522 Non-pressure chronic ulcer of other part of left foot with fat layer exposed: Secondary | ICD-10-CM

## 2021-12-02 DIAGNOSIS — M79672 Pain in left foot: Secondary | ICD-10-CM | POA: Diagnosis not present

## 2021-12-02 MED ORDER — OXYCODONE HCL 5 MG PO TABS: 5.0000 mg | ORAL_TABLET | Freq: Four times a day (QID) | ORAL | 0 refills | Status: DC | PRN

## 2021-12-02 NOTE — Progress Notes (Signed)
Office Visit Note   Patient: [name]           Date of Birth: [birth year]           MRN: [id number] Visit Date: 12/02/2021              Requested by: Mackie Pai, PA-C [place],  [place] [id number] PCP: Mackie Pai, PA-C  Chief Complaint  Patient presents with   Left Foot - Follow-up      HPI: Patient presents in follow-up for left foot ulcer beneath the second metatarsal head.  Patient states that she had her times mixed up and did not obtain the MRI scan on Saturday.  She has been doing Bactroban dressing changes.  She has completed a course of antibiotics.  Assessment & Plan: Visit Diagnoses:  1. Ulcer of left foot, with fat layer exposed ([place])   2. Left foot pain     Plan: MRI scan reordered with the concern for osteomyelitis of the second metatarsal head.  Ulcer debrided.  Follow-Up Instructions: Return in about 2 weeks (around 12/16/2021).   Ortho Exam  Patient is alert, oriented, no adenopathy, well-dressed, normal affect, normal respiratory effort. Examination patient has a good dorsalis pedis pulse she has a persistent ulcer beneath the second metatarsal head.  After informed consent a 10 blade knife was used to debride the skin and soft tissue back to healthy viable granulation tissue this was touched with silver nitrate.  The ulcer probes down to bone.  After debridement the ulcer is 3 cm in diameter.  5 mm deep.  Imaging: No results found. No images are attached to the encounter.  Labs: Lab Results  Component Value Date   HGBA1C 9.8 (H) 08/26/2021   HGBA1C 12.3 (H) 04/23/2021   HGBA1C 7.9 (H) 08/24/2020   ESRSEDRATE 41 (H) 08/24/2020   REPTSTATUS 05/23/2021 FINAL 05/21/2021   GRAMSTAIN  05/01/2021    FEW WBC PRESENT,BOTH PMN AND MONONUCLEAR NO ORGANISMS SEEN    CULT >=100,000 COLONIES/mL SERRATIA MARCESCENS (A) 05/21/2021   LABORGA SERRATIA MARCESCENS (A) 05/21/2021     Lab Results  Component Value Date    ALBUMIN 4.0 08/26/2021   ALBUMIN 3.9 05/28/2021   ALBUMIN 3.1 (L) 05/22/2021    Lab Results  Component Value Date   MG 1.8 07/11/2017   No results found for: "VD25OH"  No results found for: "PREALBUMIN"    Latest Ref Rng & Units 08/26/2021    3:04 PM 06/14/2021   11:39 AM 05/28/2021   12:55 PM  CBC EXTENDED  WBC 4.0 - 10.5 K/uL 7.3  4.6  7.0   RBC 3.87 - 5.11 Mil/uL 4.21  4.33  3.79   Hemoglobin 12.0 - 15.0 g/dL 12.3  12.9  11.2   HCT 36.0 - 46.0 % 37.1  40.3  34.3   Platelets 150.0 - 400.0 K/uL 260.0  327  278.0   NEUT# 1.4 - 7.7 K/uL 3.3  1,504  3.1   Lymph# 0.7 - 4.0 K/uL 2.6  2,245  2.7      There is no height or weight on file to calculate BMI.  Orders:  Orders Placed This Encounter  Procedures   MR Foot Left w/o contrast   Meds ordered this encounter  Medications   oxyCODONE (OXY IR/ROXICODONE) 5 MG immediate release tablet    Sig: Take 1 tablet (5 mg total) by mouth every 6 (six) hours as needed for severe pain. 1 tab po  bid prn severe pain    Dispense:  28 tablet    Refill:  0     Procedures: No procedures performed  Clinical Data: No additional findings.  ROS:  All other systems negative, except as noted in the HPI. Review of Systems  Objective: Vital Signs: There were no vitals taken for this visit.  Specialty Comments:  No specialty comments available.  PMFS History: Patient Active Problem List   Diagnosis Date Noted   Sepsis ([place]) 05/21/2021   Chronic diastolic HF (heart failure) ([place]) 05/21/2021   Abscess of left lower leg    Necrotizing fasciitis ([place])    Cellulitis of left leg 04/22/2021   Sepsis due to cellulitis ([place]) 04/22/2021   Severe sepsis ([place]) 04/22/2021   Acute kidney injury superimposed on chronic kidney disease ([place]) 04/22/2021   Chest pain, unspecified 04/22/2021   Uncontrolled type 2 diabetes mellitus with hyperglycemia, without long-term current use of insulin ([place]) 04/22/2021   Pain of left calf 04/19/2021   Viral  upper respiratory tract infection 11/30/2020   Trigeminal neuralgia    Cervical cancer (HCC)    Chronic back pain    Coronary artery disease    GERD (gastroesophageal reflux disease)    Hyperlipidemia    Hypertension    Dyslipidemia 02/01/2019   Cardiac murmur 02/01/2019   Lumbar spinal stenosis 02/11/2018   Neuropathy 07/20/2017   Right flank pain    Nausea & vomiting 07/07/2017   Acute cystitis 07/07/2017   CAD (coronary artery disease) 07/07/2017   Metatarsalgia of both feet 03/11/2017   Diabetic gastroparesis associated with type 1 diabetes mellitus ([place]) 11/27/2016   Pure hypercholesterolemia 11/27/2016   Lumbar facet arthropathy 05/14/2016   Chronic pain syndrome 01/23/2016   Diabetic peripheral neuropathy ([place]) 01/23/2016   Anxiety 01/22/2016   History of cervical cancer 08/17/2015   Depression 08/10/2015   Overweight (BMI 25.0-29.9) 07/21/2014   Chronic kidney disease, stage III (moderate) ([place]) 05/23/2014   Diabetes type 2, uncontrolled 05/23/2014   Current use of insulin ([place]) 05/23/2014   Vitamin D deficiency 04/25/2013   Cerebrovascular disease 09/13/2012   Migraine 09/13/2012   Nonspecific abnormal findings on radiological and examination of skull and head 09/13/2012   Retention of urine 06/25/2011   ANKLE PAIN, LEFT 12/25/2009   DYSURIA 12/25/2009   VAGINITIS, CANDIDAL 12/25/2009   ULCER-GASTRIC 09/28/2009   DIABETIC PERIPHERAL NEUROPATHY 09/21/2009   TOBACCO ABUSE 09/21/2009   UTI (urinary tract infection) 09/21/2009   INSOMNIA 09/21/2009   TRANSAMINASES, SERUM, ELEVATED 09/21/2009   Elevation of level of transaminase or lactic acid dehydrogenase (LDH) 09/21/2009   Type 2 diabetes mellitus with stage 3 chronic kidney disease ([place]) 09/14/2009   Nausea with vomiting, unspecified 09/14/2009   ABDOMINAL PAIN-EPIGASTRIC 09/14/2009   Cardiomyopathy, secondary ([place]) 09/06/2009   Cardiomyopathy, unspecified ([place]) 09/06/2009   Essential hypertension 08/30/2009    DM2 (diabetes mellitus, type 2) ([place]) 07/30/2009   HLD (hyperlipidemia) 07/30/2009   GERD 07/30/2009   GASTROPARESIS 07/30/2009   CHEST PAIN, ATYPICAL 07/30/2009   POSITIVE PPD 07/30/2009   Type 2 diabetes mellitus with hyperglycemia ([place]) 07/30/2009   Gastric atony 07/30/2009   Gastroesophageal reflux disease 07/30/2009   Hyperlipidemia, unspecified 07/30/2009   Tuberculin test reaction 07/30/2009   Past Medical History:  Diagnosis Date   ABDOMINAL PAIN-EPIGASTRIC 09/14/2009   Qualifier: Diagnosis of  By: Trellis Paganini PA-c, [name]    Acute cystitis 07/07/2017   ANKLE PAIN, LEFT 12/25/2009   Qualifier: Diagnosis of  By: Amil Amen MD, Benjamine Mola  Anxiety 01/22/2016   CAD (coronary artery disease) 07/07/2017   Cardiac murmur 02/01/2019   Cardiomyopathy, secondary ([place]) 09/06/2009   Qualifier: Diagnosis of  By: Arvid Right   Formatting of this note might be different from the original. Overview:  Qualifier: Diagnosis of  By: Arvid Right   Cardiomyopathy, unspecified ([place]) 09/06/2009   Formatting of this note might be different from the original. Overview:  Overview:  Qualifier: Diagnosis of  By: Arvid Right  Overview:  Overview:  Qualifier: Diagnosis of  By: Arvid Right Formatting of this note might be different from the original. Overview:  Qualifier: Diagnosis of  By: Arvid Right   Cerebrovascular disease 09/13/2012   Cervical cancer ([place])    had surgery in 2001.   CHEST PAIN, ATYPICAL 07/30/2009   Qualifier: Diagnosis of  By: Amil Amen MD, Winona Legato of this note might be different from the original. Overview:  Qualifier: Diagnosis of  By: Amil Amen MD, [name]   Chronic back pain    Chronic kidney disease, stage III (moderate) ([place]) 05/23/2014   Chronic pain syndrome 01/23/2016   Coronary artery disease    30% lesions noted   Current use of insulin ([place]) 05/23/2014   Depression     Diabetes mellitus    DIABETES MELLITUS, TYPE II, UNCONTROLLED 07/30/2009   Qualifier: Diagnosis of  By: Amil Amen MD, [name]     Diabetes type 2, uncontrolled 05/23/2014   Diabetic gastroparesis associated with type 1 diabetes mellitus ([place]) 11/27/2016   DIABETIC PERIPHERAL NEUROPATHY 09/21/2009   Qualifier: Diagnosis of  By: Amil Amen MD, [name]     Diabetic peripheral neuropathy ([place]) 01/23/2016   Dysuria 12/25/2009   Qualifier: Diagnosis of  By: Amil Amen MD, [name]     Elevation of level of transaminase or lactic acid dehydrogenase (LDH) 09/21/2009   Formatting of this note might be different from the original. Overview:  Qualifier: Diagnosis of  By: Amil Amen MD, [place]   Essential hypertension 08/30/2009   Qualifier: Diagnosis of  By: Lovette Cliche, CNA, Christ[place]     Gastric atony 07/30/2009   Formatting of this note might be different from the original. Overview:  Qualifier: Diagnosis of  By: Amil Amen MD, [place]   Gastroesophageal reflux disease 07/30/2009   Formatting of this note might be different from the original. Overview:  Overview:  Qualifier: Diagnosis of  By: Amil Amen MD, Elby Showers: Diagnosis of  By: [place] Holstein NP, Ellwood Handler of this note might be different from the original. Overview:  Qualifier: Diagnosis of  By: Amil Amen MD, Elby Showers: Diagnosis of  By: [place] Holstein NP, Nevin Bloodgood   Gastroparesis 07/30/2009   Qualifier: Diagnosis of  By: Amil Amen MD, [name]     GERD 07/30/2009   Qualifier: Diagnosis of  By: Amil Amen MD, Elby Showers: Diagnosis of  By: [place] Holstein NP, Nevin Bloodgood     GERD (gastroesophageal reflux disease)    History of cervical cancer 08/17/2015   Status post partial hysterectomy  Formatting of this note might be different from the original. Status post partial hysterectomy   HLD (hyperlipidemia) 07/30/2009   Qualifier: Diagnosis of  By: Amil Amen MD, [name]     Hyperlipidemia    Hyperlipidemia, unspecified 07/30/2009   Formatting of this  note might be different from the original. Overview:  Qualifier: Diagnosis of  By: Amil Amen MD, Winona Legato of this note might be different from the original. Overview:  Qualifier: Diagnosis of  By: Amil Amen  MD, [name]   Hypertension    INSOMNIA 09/21/2009   Qualifier: Diagnosis of  By: Amil Amen MD, [name]     Lumbar facet arthropathy 05/14/2016   Lumbar spinal stenosis 02/11/2018   Metatarsalgia of both feet 03/11/2017   Migraine 09/13/2012   Overview:  IMPRESSION: possible abd migraine   Mixed dyslipidemia 02/01/2019   Nausea & vomiting 07/07/2017   Nausea with vomiting, unspecified 09/14/2009   Qualifier: Diagnosis of  By: Shane Crutch, [name]   Formatting of this note might be different from the original. Overview:  Qualifier: Diagnosis of  By: [place] Crutch, [name]   Neuropathy    Nonspecific abnormal findings on radiological and examination of skull and head 09/13/2012   Overweight (BMI 25.0-29.9) 07/21/2014   POSITIVE PPD 07/30/2009   Annotation: CXR clear ?  diagnosed in 2/11?  On INH/pyridoxine Qualifier: Diagnosis of  By: Amil Amen MD, [name]     Pure hypercholesterolemia 11/27/2016   Retention of urine 06/25/2011   Right flank pain    TOBACCO ABUSE 09/21/2009   Qualifier: Diagnosis of  By: Amil Amen MD, Winona Legato of this note might be different from the original. Overview:  Qualifier: Diagnosis of  By: Amil Amen MD, Elinor Parkinson, SERUM, ELEVATED 09/21/2009   Qualifier: Diagnosis of  By: Amil Amen MD, [name]     Trigeminal neuralgia    Tuberculin test reaction 07/30/2009   Formatting of this note might be different from the original. Overview:  Annotation: CXR clear ?  diagnosed in 2/11?  On INH/pyridoxine Qualifier: Diagnosis of  By: Amil Amen MD, [name]   Type 2 diabetes mellitus with hyperglycemia ([place]) 07/30/2009   Qualifier: Diagnosis of  By: Amil Amen MD, Winona Legato of this note might be different from the original. Overview:   Qualifier: Diagnosis of  By: Amil Amen MD, [name]   Type 2 diabetes mellitus with hyperglycemia, with long-term current use of insulin ([place]) 07/30/2009   Formatting of this note might be different from the original. Overview:  05/30/2015 A1C was 16.7% (!)  Overview:  05/30/2015 A1C was 16.7% (!) Formatting of this note might be different from the original. 05/30/2015 A1C was 16.7% (!)   Type 2 diabetes mellitus with stage 3 chronic kidney disease ([place]) 09/14/2009   Qualifier: Diagnosis of  By: [place] Crutch, [name]    ULCER-GASTRIC 09/28/2009   Qualifier: Diagnosis of  By: [place] Holstein NP, [place]     URINARY TRACT INFECTION 09/21/2009   Qualifier: Diagnosis of  By: Amil Amen MD, Louanne Belton, CANDIDAL 12/25/2009   Qualifier: Diagnosis of  By: Amil Amen MD, Tessa Lerner D deficiency 04/25/2013    Family History  Problem Relation Age of Onset   Diabetes Mother    Diabetes Father     Past Surgical History:  Procedure Laterality Date   ABDOMINAL HYSTERECTOMY     partial   CHOLECYSTECTOMY     I & D EXTREMITY Left 04/24/2021   Procedure: LEFT LEG DEBRIDEMENT;  Surgeon: [name], MD;  Location: [facility];  Service: Orthopedics;  Laterality: Left;   I & D EXTREMITY Left 05/01/2021   Procedure: LEFT KNEE DEBRIDEMENT;  Surgeon: [name], MD;  Location: [facility];  Service: Orthopedics;  Laterality: Left;   Social History   Occupational History   Not on file  Tobacco Use   Smoking status: Never   Smokeless tobacco: Never  Vaping Use   Vaping Use: Never used  Substance and  Sexual Activity   Alcohol use: Yes    Comment: occasionally   Drug use: No   Sexual activity: Not on file

## 2021-12-02 NOTE — Telephone Encounter (Signed)
Pt called requesting a call back concerning her medication just sent to pharmacy. Please call pt at [phone].

## 2021-12-02 NOTE — Telephone Encounter (Signed)
I called and [name] and she states that the pharmacy filled the rx for Oxycodone 5/325 written 11/29/21 and she declined this in the store. There was another rx for Oxycodone 5 mg that was written and that was the rx that she was to have filled. I called and lm on vm for pharmacy to advise to fill the rx written on 11/28/2021 and d/c the one written on 11/29/21 and to call me with any questions.

## 2021-12-02 NOTE — Telephone Encounter (Signed)
[facility] called back and states that they did not get the rx for Oxycodone 5 mg on 11/28/2021 can you resubmit this and they will disregard the rx written on 11/29/21. Thanks.

## 2021-12-05 ENCOUNTER — Telehealth: Payer: Self-pay | Admitting: Orthopedic Surgery

## 2021-12-05 ENCOUNTER — Ambulatory Visit (INDEPENDENT_AMBULATORY_CARE_PROVIDER_SITE_OTHER): Payer: Commercial Managed Care - HMO | Admitting: Orthopedic Surgery

## 2021-12-05 DIAGNOSIS — L97522 Non-pressure chronic ulcer of other part of left foot with fat layer exposed: Secondary | ICD-10-CM | POA: Diagnosis not present

## 2021-12-05 MED ORDER — SULFAMETHOXAZOLE-TRIMETHOPRIM 800-160 MG PO TABS: 1.0000 | ORAL_TABLET | Freq: Two times a day (BID) | ORAL | 0 refills | Status: DC

## 2021-12-05 NOTE — Telephone Encounter (Signed)
Pt scheduled at 9:45 this morning.

## 2021-12-05 NOTE — Telephone Encounter (Signed)
Pt called in stating that she had an appointment on Monday with Dr. Sharol Given to check her foot... Pt stated that today she woke up and her foot is red and swollen.... Pt stated that she can't even walk on her foot right now... Pt was wondering if she can get antibiotics to help with the infection until she can get the MRI on her foot.... Pt requesting callback as soon as possible.Marland KitchenMarland KitchenMarland Kitchen

## 2021-12-06 ENCOUNTER — Encounter: Payer: Self-pay | Admitting: Orthopedic Surgery

## 2021-12-06 NOTE — Progress Notes (Signed)
Office Visit Note   Patient: [name]           Date of Birth: 24-Dec-1965           MRN: [id number] Visit Date: 12/05/2021              Requested by: Mackie Pai, PA-C [place],  [place] [id number] PCP: Mackie Pai, PA-C  Chief Complaint  Patient presents with   Left Foot - Wound Check      HPI: Patient is a 56 year old woman with ulceration beneath the second metatarsal head left foot.  Patient was scheduled for an MRI scan and the MRI is still pending.  Patient states that her foot feels red swollen and painful.  She has completed a course of Augmentin.  And has been on doxycycline.  Assessment & Plan: Visit Diagnoses:  1. Ulcer of left foot, with fat layer exposed ([place])     Plan: We will call in a prescription for Bactrim DS.  We will have her MRI scan rescheduled.  Follow-Up Instructions: Return in about 2 weeks (around 12/19/2021).   Ortho Exam  Patient is alert, oriented, no adenopathy, well-dressed, normal affect, normal respiratory effort. Examination patient's foot is warm there is no purulence.  There is an ulcer beneath the second metatarsal head patient has increased pain throughout the midfoot.  Imaging: No results found. No images are attached to the encounter.  Labs: Lab Results  Component Value Date   HGBA1C 9.8 (H) 08/26/2021   HGBA1C 12.3 (H) 04/23/2021   HGBA1C 7.9 (H) 08/24/2020   ESRSEDRATE 41 (H) 08/24/2020   REPTSTATUS 05/23/2021 FINAL 05/21/2021   GRAMSTAIN  05/01/2021    FEW WBC PRESENT,BOTH PMN AND MONONUCLEAR NO ORGANISMS SEEN    CULT >=100,000 COLONIES/mL SERRATIA MARCESCENS (A) 05/21/2021   LABORGA SERRATIA MARCESCENS (A) 05/21/2021     Lab Results  Component Value Date   ALBUMIN 4.0 08/26/2021   ALBUMIN 3.9 05/28/2021   ALBUMIN 3.1 (L) 05/22/2021    Lab Results  Component Value Date   MG 1.8 07/11/2017   No results found for: "VD25OH"  No results found for: "PREALBUMIN"     Latest Ref Rng & Units 08/26/2021    3:04 PM 06/14/2021   11:39 AM 05/28/2021   12:55 PM  CBC EXTENDED  WBC 4.0 - 10.5 K/uL 7.3  4.6  7.0   RBC 3.87 - 5.11 Mil/uL 4.21  4.33  3.79   Hemoglobin 12.0 - 15.0 g/dL 12.3  12.9  11.2   HCT 36.0 - 46.0 % 37.1  40.3  34.3   Platelets 150.0 - 400.0 K/uL 260.0  327  278.0   NEUT# 1.4 - 7.7 K/uL 3.3  1,504  3.1   Lymph# 0.7 - 4.0 K/uL 2.6  2,245  2.7      There is no height or weight on file to calculate BMI.  Orders:  No orders of the defined types were placed in this encounter.  Meds ordered this encounter  Medications   sulfamethoxazole-trimethoprim (BACTRIM DS) 800-160 MG tablet    Sig: Take 1 tablet by mouth 2 (two) times daily.    Dispense:  20 tablet    Refill:  0     Procedures: No procedures performed  Clinical Data: No additional findings.  ROS:  All other systems negative, except as noted in the HPI. Review of Systems  Objective: Vital Signs: There were no vitals taken for this visit.  Specialty  Comments:  No specialty comments available.  PMFS History: Patient Active Problem List   Diagnosis Date Noted   Sepsis ([place]) 05/21/2021   Chronic diastolic HF (heart failure) ([place]) 05/21/2021   Abscess of left lower leg    Necrotizing fasciitis ([place])    Cellulitis of left leg 04/22/2021   Sepsis due to cellulitis ([place]) 04/22/2021   Severe sepsis ([place]) 04/22/2021   Acute kidney injury superimposed on chronic kidney disease ([place]) 04/22/2021   Chest pain, unspecified 04/22/2021   Uncontrolled type 2 diabetes mellitus with hyperglycemia, without long-term current use of insulin ([facility]) 04/22/2021   Pain of left calf 04/19/2021   Viral upper respiratory tract infection 11/30/2020   Trigeminal neuralgia    Cervical cancer (HCC)    Chronic back pain    Coronary artery disease    GERD (gastroesophageal reflux disease)    Hyperlipidemia    Hypertension    Dyslipidemia 02/01/2019   Cardiac murmur 02/01/2019   Lumbar spinal  stenosis 02/11/2018   Neuropathy 07/20/2017   Right flank pain    Nausea & vomiting 07/07/2017   Acute cystitis 07/07/2017   CAD (coronary artery disease) 07/07/2017   Metatarsalgia of both feet 03/11/2017   Diabetic gastroparesis associated with type 1 diabetes mellitus ([place]) 11/27/2016   Pure hypercholesterolemia 11/27/2016   Lumbar facet arthropathy 05/14/2016   Chronic pain syndrome 01/23/2016   Diabetic peripheral neuropathy ([place]) 01/23/2016   Anxiety 01/22/2016   History of cervical cancer 08/17/2015   Depression 08/10/2015   Overweight (BMI 25.0-29.9) 07/21/2014   Chronic kidney disease, stage III (moderate) ([place]) 05/23/2014   Diabetes type 2, uncontrolled 05/23/2014   Current use of insulin ([facility]) 05/23/2014   Vitamin D deficiency 04/25/2013   Cerebrovascular disease 09/13/2012   Migraine 09/13/2012   Nonspecific abnormal findings on radiological and examination of skull and head 09/13/2012   Retention of urine 06/25/2011   ANKLE PAIN, LEFT 12/25/2009   DYSURIA 12/25/2009   VAGINITIS, CANDIDAL 12/25/2009   ULCER-GASTRIC 09/28/2009   DIABETIC PERIPHERAL NEUROPATHY 09/21/2009   TOBACCO ABUSE 09/21/2009   UTI (urinary tract infection) 09/21/2009   INSOMNIA 09/21/2009   TRANSAMINASES, SERUM, ELEVATED 09/21/2009   Elevation of level of transaminase or lactic acid dehydrogenase (LDH) 09/21/2009   Type 2 diabetes mellitus with stage 3 chronic kidney disease ([place]) 09/14/2009   Nausea with vomiting, unspecified 09/14/2009   ABDOMINAL PAIN-EPIGASTRIC 09/14/2009   Cardiomyopathy, secondary ([place]) 09/06/2009   Cardiomyopathy, unspecified ([place]) 09/06/2009   Essential hypertension 08/30/2009   DM2 (diabetes mellitus, type 2) ([place]) 07/30/2009   HLD (hyperlipidemia) 07/30/2009   GERD 07/30/2009   GASTROPARESIS 07/30/2009   CHEST PAIN, ATYPICAL 07/30/2009   POSITIVE PPD 07/30/2009   Type 2 diabetes mellitus with hyperglycemia ([place]) 07/30/2009   Gastric atony 07/30/2009    Gastroesophageal reflux disease 07/30/2009   Hyperlipidemia, unspecified 07/30/2009   Tuberculin test reaction 07/30/2009   Past Medical History:  Diagnosis Date   ABDOMINAL PAIN-EPIGASTRIC 09/14/2009   Qualifier: Diagnosis of  By: Trellis Paganini PA-c, [name]    Acute cystitis 07/07/2017   ANKLE PAIN, LEFT 12/25/2009   Qualifier: Diagnosis of  By: Amil Amen MD, [name]     Anxiety 01/22/2016   CAD (coronary artery disease) 07/07/2017   Cardiac murmur 02/01/2019   Cardiomyopathy, secondary ([place]) 09/06/2009   Qualifier: Diagnosis of  By: Arvid Right   Formatting of this note might be different from the original. Overview:  Qualifier: Diagnosis of  By: Arvid Right   Cardiomyopathy, unspecified (  [place]) 09/06/2009   Formatting of this note might be different from the original. Overview:  Overview:  Qualifier: Diagnosis of  By: Arvid Right  Overview:  Overview:  Qualifier: Diagnosis of  By: Arvid Right Formatting of this note might be different from the original. Overview:  Qualifier: Diagnosis of  By: Arvid Right   Cerebrovascular disease 09/13/2012   Cervical cancer ([place])    had surgery in 2001.   CHEST PAIN, ATYPICAL 07/30/2009   Qualifier: Diagnosis of  By: Amil Amen MD, Winona Legato of this note might be different from the original. Overview:  Qualifier: Diagnosis of  By: Amil Amen MD, [name]   Chronic back pain    Chronic kidney disease, stage III (moderate) ([place]) 05/23/2014   Chronic pain syndrome 01/23/2016   Coronary artery disease    30% lesions noted   Current use of insulin ([place]) 05/23/2014   Depression    Diabetes mellitus    DIABETES MELLITUS, TYPE II, UNCONTROLLED 07/30/2009   Qualifier: Diagnosis of  By: Amil Amen MD, [name]     Diabetes type 2, uncontrolled 05/23/2014   Diabetic gastroparesis associated with type 1 diabetes mellitus ([place]) 11/27/2016   DIABETIC PERIPHERAL NEUROPATHY  09/21/2009   Qualifier: Diagnosis of  By: Amil Amen MD, [name]     Diabetic peripheral neuropathy ([place]) 01/23/2016   Dysuria 12/25/2009   Qualifier: Diagnosis of  By: Amil Amen MD, [name]     Elevation of level of transaminase or lactic acid dehydrogenase (LDH) 09/21/2009   Formatting of this note might be different from the original. Overview:  Qualifier: Diagnosis of  By: Amil Amen MD, [place]   Essential hypertension 08/30/2009   Qualifier: Diagnosis of  By: Lovette Cliche, CNA, Christy     Gastric atony 07/30/2009   Formatting of this note might be different from the original. Overview:  Qualifier: Diagnosis of  By: Amil Amen MD, [place]   Gastroesophageal reflux disease 07/30/2009   Formatting of this note might be different from the original. Overview:  Overview:  Qualifier: Diagnosis of  By: Amil Amen MD, Elby Showers: Diagnosis of  By: Chester Holstein NP, Ellwood Handler of this note might be different from the original. Overview:  Qualifier: Diagnosis of  By: Amil Amen MD, Elby Showers: Diagnosis of  By: Chester Holstein NP, Nevin Bloodgood   Gastroparesis 07/30/2009   Qualifier: Diagnosis of  By: Amil Amen MD, [name]     GERD 07/30/2009   Qualifier: Diagnosis of  By: Amil Amen MD, Elby Showers: Diagnosis of  By: Chester Holstein NP, Nevin Bloodgood     GERD (gastroesophageal reflux disease)    History of cervical cancer 08/17/2015   Status post partial hysterectomy  Formatting of this note might be different from the original. Status post partial hysterectomy   HLD (hyperlipidemia) 07/30/2009   Qualifier: Diagnosis of  By: Amil Amen MD, [name]     Hyperlipidemia    Hyperlipidemia, unspecified 07/30/2009   Formatting of this note might be different from the original. Overview:  Qualifier: Diagnosis of  By: Amil Amen MD, Winona Legato of this note might be different from the original. Overview:  Qualifier: Diagnosis of  By: Amil Amen MD, [name]   Hypertension    INSOMNIA 09/21/2009   Qualifier:  Diagnosis of  By: Amil Amen MD, [name]     Lumbar facet arthropathy 05/14/2016   Lumbar spinal stenosis 02/11/2018   Metatarsalgia of both feet 03/11/2017   Migraine 09/13/2012   Overview:  IMPRESSION: possible abd migraine   Mixed dyslipidemia 02/01/2019  Nausea & vomiting 07/07/2017   Nausea with vomiting, unspecified 09/14/2009   Qualifier: Diagnosis of  By: Shane Crutch, [name]   Formatting of this note might be different from the original. Overview:  Qualifier: Diagnosis of  By: Shane Crutch, [name]   Neuropathy    Nonspecific abnormal findings on radiological and examination of skull and head 09/13/2012   Overweight (BMI 25.0-29.9) 07/21/2014   POSITIVE PPD 07/30/2009   Annotation: CXR clear ?  diagnosed in 2/11?  On INH/pyridoxine Qualifier: Diagnosis of  By: Amil Amen MD, [name]     Pure hypercholesterolemia 11/27/2016   Retention of urine 06/25/2011   Right flank pain    TOBACCO ABUSE 09/21/2009   Qualifier: Diagnosis of  By: Amil Amen MD, Winona Legato of this note might be different from the original. Overview:  Qualifier: Diagnosis of  By: Amil Amen MD, Elinor Parkinson, SERUM, ELEVATED 09/21/2009   Qualifier: Diagnosis of  By: Amil Amen MD, [name]     Trigeminal neuralgia    Tuberculin test reaction 07/30/2009   Formatting of this note might be different from the original. Overview:  Annotation: CXR clear ?  diagnosed in 2/11?  On INH/pyridoxine Qualifier: Diagnosis of  By: Amil Amen MD, [name]   Type 2 diabetes mellitus with hyperglycemia ([place]) 07/30/2009   Qualifier: Diagnosis of  By: Amil Amen MD, Winona Legato of this note might be different from the original. Overview:  Qualifier: Diagnosis of  By: Amil Amen MD, [name]   Type 2 diabetes mellitus with hyperglycemia, with long-term current use of insulin ([place]) 07/30/2009   Formatting of this note might be different from the original. Overview:  05/30/2015 A1C was 16.7% (!)  Overview:  05/30/2015 A1C was  16.7% (!) Formatting of this note might be different from the original. 05/30/2015 A1C was 16.7% (!)   Type 2 diabetes mellitus with stage 3 chronic kidney disease ([place]) 09/14/2009   Qualifier: Diagnosis of  By: Shane Crutch, [name]    ULCER-GASTRIC 09/28/2009   Qualifier: Diagnosis of  By: Chester Holstein NP, Paula     URINARY TRACT INFECTION 09/21/2009   Qualifier: Diagnosis of  By: Amil Amen MD, Louanne Belton, CANDIDAL 12/25/2009   Qualifier: Diagnosis of  By: Amil Amen MD, Tessa Lerner D deficiency 04/25/2013    Family History  Problem Relation Age of Onset   Diabetes Mother    Diabetes Father     Past Surgical History:  Procedure Laterality Date   ABDOMINAL HYSTERECTOMY     partial   CHOLECYSTECTOMY     I & D EXTREMITY Left 04/24/2021   Procedure: LEFT LEG DEBRIDEMENT;  Surgeon: [name], MD;  Location: [facility];  Service: Orthopedics;  Laterality: Left;   I & D EXTREMITY Left 05/01/2021   Procedure: LEFT KNEE DEBRIDEMENT;  Surgeon: [name], MD;  Location: [facility];  Service: Orthopedics;  Laterality: Left;   Social History   Occupational History   Not on file  Tobacco Use   Smoking status: Never   Smokeless tobacco: Never  Vaping Use   Vaping Use: Never used  Substance and Sexual Activity   Alcohol use: Yes    Comment: occasionally   Drug use: No   Sexual activity: Not on file

## 2021-12-11 ENCOUNTER — Encounter: Payer: Self-pay | Admitting: Family

## 2021-12-16 ENCOUNTER — Ambulatory Visit: Payer: Commercial Managed Care - HMO | Admitting: Orthopedic Surgery

## 2021-12-19 ENCOUNTER — Other Ambulatory Visit: Payer: Self-pay | Admitting: Orthopedic Surgery

## 2021-12-19 MED ORDER — SULFAMETHOXAZOLE-TRIMETHOPRIM 800-160 MG PO TABS: 1.0000 | ORAL_TABLET | Freq: Two times a day (BID) | ORAL | 0 refills | Status: DC

## 2021-12-19 NOTE — Telephone Encounter (Signed)
Pt was on bactrim DS for ulcer of left second MT. MRI scheduled 12/24/21 She says when she was taking abx she felt a lot better and ulcer was looking well. She has now completed course and feeling like she was before abx. Can she get a refill?

## 2021-12-19 NOTE — Telephone Encounter (Signed)
Patient called in stating she is finished with antibiotics and Pain Medication everything was fine but she feels she ,may need it refilled because the pain and swelling is coming back. Condition was manageable and going away when she was on antibiotics. Please advise

## 2021-12-24 ENCOUNTER — Ambulatory Visit
Admission: RE | Admit: 2021-12-24 | Discharge: 2021-12-24 | Disposition: A | Discharge status: Home / Self Care | Payer: Commercial Managed Care - HMO | Source: Ambulatory Visit | Attending: Family | Admitting: Family

## 2021-12-24 ENCOUNTER — Other Ambulatory Visit: Payer: Self-pay | Admitting: Orthopedic Surgery

## 2021-12-24 ENCOUNTER — Telehealth: Payer: Self-pay | Admitting: Orthopedic Surgery

## 2021-12-24 DIAGNOSIS — L97522 Non-pressure chronic ulcer of other part of left foot with fat layer exposed: Secondary | ICD-10-CM

## 2021-12-24 DIAGNOSIS — M79672 Pain in left foot: Secondary | ICD-10-CM

## 2021-12-24 MED ORDER — OXYCODONE HCL 5 MG PO TABS: 5.0000 mg | ORAL_TABLET | Freq: Four times a day (QID) | ORAL | 0 refills | Status: DC | PRN

## 2021-12-24 NOTE — Telephone Encounter (Signed)
Pt sch for MRI today eval osteo of foot. Pt is requesting refill of Oxycodone 5 mg. Last refilled on 12/02/21 #28

## 2021-12-24 NOTE — Telephone Encounter (Signed)
Pt called requesting a refill of pain medication. The pharmacy refilled antibiotics but not pain medication. Please send pain medication to pharmacy on file. Pt phone number is [phone]

## 2021-12-27 ENCOUNTER — Telehealth: Payer: Self-pay | Admitting: Orthopedic Surgery

## 2021-12-27 NOTE — Telephone Encounter (Signed)
Called patient left message to return call to schedule an appointment with Dr. Sharol Given for MRI review

## 2021-12-27 NOTE — Progress Notes (Signed)
Needsfu c duda please

## 2021-12-30 ENCOUNTER — Telehealth: Payer: Self-pay | Admitting: Medical

## 2021-12-30 MED ORDER — METHOCARBAMOL 750 MG PO TABS: 750.0000 mg | ORAL_TABLET | Freq: Three times a day (TID) | ORAL | 0 refills | Status: DC | PRN

## 2021-12-30 NOTE — Telephone Encounter (Signed)
Rx sent 

## 2021-12-30 NOTE — Telephone Encounter (Signed)
Medication: methocarbamol (ROBAXIN) 750 MG tablet  Has the patient contacted their pharmacy? No.   Preferred Pharmacy :  [facility], [place]. [address] [place] [address] Phone: [phone]  Fax: [phone]

## 2022-01-04 ENCOUNTER — Other Ambulatory Visit: Payer: Self-pay

## 2022-01-04 ENCOUNTER — Inpatient Hospital Stay (HOSPITAL_BASED_OUTPATIENT_CLINIC_OR_DEPARTMENT_OTHER)
Admission: EM | Admit: 2022-01-04 | Discharge: 2022-01-08 | DRG: 617 | Disposition: A | Discharge status: Home / Self Care | Payer: Commercial Managed Care - HMO | Attending: Internal Medicine | Admitting: Internal Medicine

## 2022-01-04 ENCOUNTER — Emergency Department (HOSPITAL_BASED_OUTPATIENT_CLINIC_OR_DEPARTMENT_OTHER): Payer: Commercial Managed Care - HMO

## 2022-01-04 ENCOUNTER — Encounter (HOSPITAL_BASED_OUTPATIENT_CLINIC_OR_DEPARTMENT_OTHER): Payer: Self-pay | Admitting: Emergency Medicine

## 2022-01-04 DIAGNOSIS — E1069 Type 1 diabetes mellitus with other specified complication: Principal | ICD-10-CM | POA: Diagnosis present

## 2022-01-04 DIAGNOSIS — Z833 Family history of diabetes mellitus: Secondary | ICD-10-CM

## 2022-01-04 DIAGNOSIS — M869 Osteomyelitis, unspecified: Secondary | ICD-10-CM | POA: Diagnosis not present

## 2022-01-04 DIAGNOSIS — Z794 Long term (current) use of insulin: Secondary | ICD-10-CM

## 2022-01-04 DIAGNOSIS — N183 Chronic kidney disease, stage 3 unspecified: Secondary | ICD-10-CM | POA: Diagnosis present

## 2022-01-04 DIAGNOSIS — Z7984 Long term (current) use of oral hypoglycemic drugs: Secondary | ICD-10-CM

## 2022-01-04 DIAGNOSIS — Z90711 Acquired absence of uterus with remaining cervical stump: Secondary | ICD-10-CM

## 2022-01-04 DIAGNOSIS — R300 Dysuria: Secondary | ICD-10-CM | POA: Diagnosis not present

## 2022-01-04 DIAGNOSIS — I2510 Atherosclerotic heart disease of native coronary artery without angina pectoris: Secondary | ICD-10-CM | POA: Diagnosis present

## 2022-01-04 DIAGNOSIS — E1065 Type 1 diabetes mellitus with hyperglycemia: Secondary | ICD-10-CM | POA: Diagnosis present

## 2022-01-04 DIAGNOSIS — E1165 Type 2 diabetes mellitus with hyperglycemia: Secondary | ICD-10-CM | POA: Diagnosis present

## 2022-01-04 DIAGNOSIS — Z7982 Long term (current) use of aspirin: Secondary | ICD-10-CM

## 2022-01-04 DIAGNOSIS — L97529 Non-pressure chronic ulcer of other part of left foot with unspecified severity: Secondary | ICD-10-CM | POA: Diagnosis present

## 2022-01-04 DIAGNOSIS — L03116 Cellulitis of left lower limb: Secondary | ICD-10-CM | POA: Diagnosis present

## 2022-01-04 DIAGNOSIS — E1042 Type 1 diabetes mellitus with diabetic polyneuropathy: Secondary | ICD-10-CM | POA: Diagnosis present

## 2022-01-04 DIAGNOSIS — E559 Vitamin D deficiency, unspecified: Secondary | ICD-10-CM | POA: Diagnosis present

## 2022-01-04 DIAGNOSIS — F39 Unspecified mood [affective] disorder: Secondary | ICD-10-CM | POA: Diagnosis present

## 2022-01-04 DIAGNOSIS — L02612 Cutaneous abscess of left foot: Secondary | ICD-10-CM | POA: Diagnosis present

## 2022-01-04 DIAGNOSIS — K3184 Gastroparesis: Secondary | ICD-10-CM | POA: Diagnosis present

## 2022-01-04 DIAGNOSIS — G894 Chronic pain syndrome: Secondary | ICD-10-CM | POA: Diagnosis present

## 2022-01-04 DIAGNOSIS — Z79899 Other long term (current) drug therapy: Secondary | ICD-10-CM

## 2022-01-04 DIAGNOSIS — I429 Cardiomyopathy, unspecified: Secondary | ICD-10-CM | POA: Diagnosis present

## 2022-01-04 DIAGNOSIS — I129 Hypertensive chronic kidney disease with stage 1 through stage 4 chronic kidney disease, or unspecified chronic kidney disease: Secondary | ICD-10-CM | POA: Diagnosis present

## 2022-01-04 DIAGNOSIS — E1043 Type 1 diabetes mellitus with diabetic autonomic (poly)neuropathy: Secondary | ICD-10-CM | POA: Diagnosis present

## 2022-01-04 DIAGNOSIS — F419 Anxiety disorder, unspecified: Secondary | ICD-10-CM | POA: Diagnosis present

## 2022-01-04 DIAGNOSIS — Z8541 Personal history of malignant neoplasm of cervix uteri: Secondary | ICD-10-CM

## 2022-01-04 DIAGNOSIS — E785 Hyperlipidemia, unspecified: Secondary | ICD-10-CM | POA: Diagnosis present

## 2022-01-04 DIAGNOSIS — Z87891 Personal history of nicotine dependence: Secondary | ICD-10-CM

## 2022-01-04 DIAGNOSIS — I10 Essential (primary) hypertension: Secondary | ICD-10-CM | POA: Diagnosis present

## 2022-01-04 DIAGNOSIS — Z7951 Long term (current) use of inhaled steroids: Secondary | ICD-10-CM

## 2022-01-04 DIAGNOSIS — K219 Gastro-esophageal reflux disease without esophagitis: Secondary | ICD-10-CM | POA: Diagnosis present

## 2022-01-04 DIAGNOSIS — E10621 Type 1 diabetes mellitus with foot ulcer: Secondary | ICD-10-CM | POA: Diagnosis present

## 2022-01-04 DIAGNOSIS — E1022 Type 1 diabetes mellitus with diabetic chronic kidney disease: Secondary | ICD-10-CM | POA: Diagnosis present

## 2022-01-04 LAB — PREGNANCY, URINE: Preg Test, Ur: NEGATIVE

## 2022-01-04 LAB — BASIC METABOLIC PANEL
Anion gap: 8 (ref 5–15)
BUN: 22 mg/dL — ABNORMAL HIGH (ref 6–20)
CO2: 24 mmol/L (ref 22–32)
Calcium: 9.1 mg/dL (ref 8.9–10.3)
Chloride: 104 mmol/L (ref 98–111)
Creatinine, Ser: 1.39 mg/dL — ABNORMAL HIGH (ref 0.44–1.00)
GFR, Estimated: 44 mL/min — ABNORMAL LOW (ref >60)
Glucose, Bld: 209 mg/dL — ABNORMAL HIGH (ref 70–99)
Potassium: 3.8 mmol/L (ref 3.5–5.1)
Sodium: 136 mmol/L (ref 135–145)

## 2022-01-04 LAB — CBC
HCT: 45.4 % (ref 36.0–46.0)
Hemoglobin: 14.6 g/dL (ref 12.0–15.0)
MCH: 29.1 pg (ref 26.0–34.0)
MCHC: 32.2 g/dL (ref 30.0–36.0)
MCV: 90.4 fL (ref 80.0–100.0)
Platelets: 360 10*3/uL (ref 150–400)
RBC: 5.02 MIL/uL (ref 3.87–5.11)
RDW: 15.3 % (ref 11.5–15.5)
WBC: 6.3 10*3/uL (ref 4.0–10.5)
nRBC: 0 % (ref 0.0–0.2)

## 2022-01-04 LAB — TROPONIN I (HIGH SENSITIVITY): Troponin I (High Sensitivity): 5 ng/L (ref <18)

## 2022-01-04 NOTE — ED Triage Notes (Signed)
Intermittent L sided sharp chest pains x2-3 days that last 1-2 min. Also mentions L foot edema and pain x 3 days. Pt has a wound to L foot x months, sees Dr. Sharol Given for this. Pt reports HA that is intermittent and sharp as well that lasts 2-3 min and started 2-3 days ago. Denies [facility] [place], recent long distance travel.

## 2022-01-04 NOTE — ED Provider Notes (Signed)
[name] EMERGENCY DEPARTMENT Provider Note   CSN: [id number] Arrival date & time: 01/04/22  1837     History {Add pertinent medical, surgical, social history, OB history to HPI:1} Chief Complaint  Patient presents with   Chest Pain    [name] is a 56 y.o. female.  Patient is a 56 year old female with past medical history of type 1 diabetes, peripheral neuropathy, hypertension, hyperlipidemia.  Patient presenting today with multiple complaints.  She describes headache, chest pain, and pain and swelling to the left foot.  She has been dealing with a sore to the bottom of her foot for the past 2 months that has become larger and deeper recently.  She has seen Dr. Sharol Given in the orthopedic clinic and had an MRI performed on November 14, but does not know the results.  She has been treated in the past with Augmentin, but is not on any antibiotics currently.  The history is provided by the patient.       Home Medications Prior to Admission medications   Medication Sig Start Date End Date Taking? Authorizing Provider  albuterol (VENTOLIN HFA) 108 (90 Base) MCG/ACT inhaler Inhale 2 puffs into the lungs every 6 (six) hours as needed for wheezing or shortness of breath. 08/03/20   Saguier, Percell Miller, PA-C  amLODipine (NORVASC) 5 MG tablet Take 1 tablet (5 mg total) by mouth daily. 11/19/21 11/14/22  Saguier, Percell Miller, PA-C  amoxicillin-clavulanate (AUGMENTIN) 500-125 MG tablet Take 1 tablet (500 mg total) by mouth 3 (three) times daily. 11/13/21   Suzan Slick, NP  aspirin 81 MG tablet Take 81 mg by mouth daily.    [provider]  atorvastatin (LIPITOR) 80 MG tablet Take 1 tablet (80 mg total) by mouth daily. 08/21/21   Saguier, Percell Miller, PA-C  azelastine (ASTELIN) 0.1 % nasal spray Place 2 sprays into both nostrils 2 (two) times daily. 04/09/20   Colon [place], MD  busPIRone (BUSPAR) 15 MG tablet Take 1 tablet (15 mg total) by mouth 2 (two) times daily. 08/21/21   Saguier,  Percell Miller, PA-C  carvedilol (COREG) 25 MG tablet Take 1 tablet (25 mg total) by mouth 2 (two) times daily with a meal. 07/23/21   Saguier, Percell Miller, PA-C  ciprofloxacin (CIPRO) 500 MG tablet Take 1 tablet (500 mg total) by mouth 2 (two) times daily. 08/02/21   Saguier, Percell Miller, PA-C  doxycycline (VIBRA-TABS) 100 MG tablet Take 1 tablet (100 mg total) by mouth 2 (two) times daily. Patient not taking: Reported on 10/20/2021 09/13/21   [name], MD  Dulaglutide (TRULICITY) 8.29 FA/2.1HY SOPN Inject 0.75 mg into the skin once a week. 09/06/21   Saguier, Percell Miller, PA-C  famotidine (PEPCID) 20 MG tablet Take 1 tablet (20 mg total) by mouth daily. 04/03/21   Saguier, Percell Miller, PA-C  fluticasone (FLONASE) 50 MCG/ACT nasal spray Place 2 sprays into both nostrils daily. 09/27/19   Saguier, Percell Miller, PA-C  fluticasone furoate-vilanterol (BREO ELLIPTA) 100-25 MCG/INH AEPB Inhale 1 puff into the lungs daily. 08/03/20   Saguier, Percell Miller, PA-C  gabapentin (NEURONTIN) 300 MG capsule Take 2 capsules (600 mg total) by mouth 3 (three) times daily. 09/30/21   Saguier, Percell Miller, PA-C  insulin glargine-yfgn (SEMGLEE) 100 UNIT/ML Pen Inject 40 Units into the skin at bedtime. 06/14/21   Saguier, Percell Miller, PA-C  meloxicam (MOBIC) 7.5 MG tablet Take 7.5 mg by mouth 2 (two) times daily.    [provider]  metFORMIN (GLUCOPHAGE-XR) 500 MG 24 hr tablet Take 1 tablet (500  mg total) by mouth daily with breakfast. 11/19/21   Saguier, Percell Miller, PA-C  methocarbamol (ROBAXIN) 750 MG tablet Take 1 tablet (750 mg total) by mouth every 8 (eight) hours as needed for muscle spasms. 12/30/21   Saguier, Percell Miller, PA-C  mupirocin ointment (BACTROBAN) 2 % Apply 1 Application topically 2 (two) times daily. 11/13/21   Suzan Slick, NP  nitroGLYCERIN (NITROSTAT) 0.4 MG SL tablet Place 1 tablet (0.4 mg total) under the tongue every 5 (five) minutes as needed for chest pain. 03/21/21   Saguier, Percell Miller, PA-C  oxyCODONE (OXY IR/ROXICODONE) 5 MG immediate release  tablet Take 1 tablet (5 mg total) by mouth every 6 (six) hours as needed for severe pain. 1 tab po bid prn severe pain 12/24/21   [name], MD  oxyCODONE-acetaminophen (PERCOCET/ROXICET) 5-325 MG tablet Take 1 tablet by mouth every 6 (six) hours as needed for severe pain. 11/29/21   [name], MD  phenazopyridine (PYRIDIUM) 100 MG tablet Take 1 tablet (100 mg total) by mouth 3 (three) times daily as needed for pain. Patient not taking: Reported on 10/20/2021 07/26/21   Shelda Pal, DO  promethazine (PHENERGAN) 12.5 MG tablet Take 1 tablet (12.5 mg total) by mouth every 8 (eight) hours as needed for nausea or vomiting. 11/19/21   Saguier, Percell Miller, PA-C  sertraline (ZOLOFT) 100 MG tablet Take 1 tablet (100 mg total) by mouth daily. 08/21/21   Saguier, Percell Miller, PA-C  sulfamethoxazole-trimethoprim (BACTRIM DS) 800-160 MG tablet Take 1 tablet by mouth 2 (two) times daily. 12/19/21   [name], MD  topiramate (TOPAMAX) 50 MG tablet Take 1 tablet (50 mg total) by mouth 2 (two) times daily. Patient taking differently: Take 50 mg by mouth 2 (two) times daily as needed (headache). 04/17/20   Penumalli, Earlean Polka, MD      Allergies    Insulin aspart, Latex, and Morphine    Review of Systems   Review of Systems  All other systems reviewed and are negative.   Physical Exam Updated Vital Signs BP (!) 130/90   Pulse 93   Temp 98 F (36.7 C) (Oral)   Resp 18   Ht 6' (1.829 m)   Wt 86.2 kg   SpO2 100%   BMI 25.77 kg/m  Physical Exam Vitals and nursing note reviewed.  Constitutional:      General: She is not in acute distress.    Appearance: She is well-developed. She is not diaphoretic.  HENT:     Head: Normocephalic and atraumatic.  Cardiovascular:     Rate and Rhythm: Normal rate and regular rhythm.     Heart sounds: No murmur heard.    No friction rub. No gallop.  Pulmonary:     Effort: Pulmonary effort is normal. No respiratory distress.     Breath sounds: Normal  breath sounds. No wheezing.  Abdominal:     General: Bowel sounds are normal. There is no distension.     Palpations: Abdomen is soft.     Tenderness: There is no abdominal tenderness.  Musculoskeletal:        General: Normal range of motion.     Cervical back: Normal range of motion and neck supple.     Comments: To the bottom of the left foot, there is an open sore with surrounding induration and erythema.  Skin:    General: Skin is warm and dry.  Neurological:     General: No focal deficit present.     Mental Status:  She is alert and oriented to person, place, and time.     ED Results / Procedures / Treatments   Labs (all labs ordered are listed, but only abnormal results are displayed) Labs Reviewed  BASIC METABOLIC PANEL - Abnormal; Notable for the following components:      Result Value   Glucose, Bld 209 (*)    BUN 22 (*)    Creatinine, Ser 1.39 (*)    GFR, Estimated 44 (*)    All other components within normal limits  CBC  PREGNANCY, URINE  TROPONIN I (HIGH SENSITIVITY)  TROPONIN I (HIGH SENSITIVITY)    EKG EKG Interpretation  Date/Time:  Saturday January 04 2022 19:13:10 EST Ventricular Rate:  97 PR Interval:  144 QRS Duration: 76 QT Interval:  360 QTC Calculation: 457 R Axis:   43 Text Interpretation: Sinus rhythm with occasional Premature ventricular complexes No significant change since 04/22/2021 Confirmed by Veryl Speak [phone]) on 01/04/2022 11:28:13 PM  Radiology DG Chest 2 View  Result Date: 01/04/2022 CLINICAL DATA:  Chest pain for several days EXAM: CHEST - 2 VIEW COMPARISON:  04/22/2021 FINDINGS: The heart size and mediastinal contours are within normal limits. Both lungs are clear. The visualized skeletal structures are unremarkable. IMPRESSION: No active cardiopulmonary disease. Electronically Signed   By: Inez Catalina M.D.   On: 01/04/2022 20:22    Procedures Procedures  {Document cardiac monitor, telemetry assessment procedure when  appropriate:1}  Medications Ordered in ED Medications - No data to display  ED Course/ Medical Decision Making/ A&P                           Medical Decision Making Amount and/or Complexity of Data Reviewed Labs: ordered. Radiology: ordered.   ***  {Document critical care time when appropriate:1} {Document review of labs and clinical decision tools ie heart score, Chads2Vasc2 etc:1}  {Document your independent review of radiology images, and any outside records:1} {Document your discussion with family members, caretakers, and with consultants:1} {Document social determinants of health affecting pt's care:1} {Document your decision making why or why not admission, treatments were needed:1} Final Clinical Impression(s) / ED Diagnoses Final diagnoses:  None    Rx / DC Orders ED Discharge Orders     None

## 2022-01-05 ENCOUNTER — Encounter (HOSPITAL_COMMUNITY): Payer: Self-pay | Admitting: Internal Medicine

## 2022-01-05 ENCOUNTER — Encounter (HOSPITAL_COMMUNITY): Payer: Self-pay

## 2022-01-05 DIAGNOSIS — I2510 Atherosclerotic heart disease of native coronary artery without angina pectoris: Secondary | ICD-10-CM | POA: Diagnosis present

## 2022-01-05 DIAGNOSIS — F39 Unspecified mood [affective] disorder: Secondary | ICD-10-CM | POA: Diagnosis present

## 2022-01-05 DIAGNOSIS — M868X7 Other osteomyelitis, ankle and foot: Secondary | ICD-10-CM

## 2022-01-05 DIAGNOSIS — G894 Chronic pain syndrome: Secondary | ICD-10-CM | POA: Diagnosis present

## 2022-01-05 DIAGNOSIS — I10 Essential (primary) hypertension: Secondary | ICD-10-CM | POA: Diagnosis not present

## 2022-01-05 DIAGNOSIS — E1042 Type 1 diabetes mellitus with diabetic polyneuropathy: Secondary | ICD-10-CM | POA: Diagnosis present

## 2022-01-05 DIAGNOSIS — F419 Anxiety disorder, unspecified: Secondary | ICD-10-CM | POA: Diagnosis present

## 2022-01-05 DIAGNOSIS — M869 Osteomyelitis, unspecified: Secondary | ICD-10-CM | POA: Diagnosis present

## 2022-01-05 DIAGNOSIS — Z833 Family history of diabetes mellitus: Secondary | ICD-10-CM | POA: Diagnosis not present

## 2022-01-05 DIAGNOSIS — E1169 Type 2 diabetes mellitus with other specified complication: Secondary | ICD-10-CM | POA: Diagnosis not present

## 2022-01-05 DIAGNOSIS — I429 Cardiomyopathy, unspecified: Secondary | ICD-10-CM | POA: Diagnosis present

## 2022-01-05 DIAGNOSIS — E785 Hyperlipidemia, unspecified: Secondary | ICD-10-CM | POA: Diagnosis present

## 2022-01-05 DIAGNOSIS — Z8541 Personal history of malignant neoplasm of cervix uteri: Secondary | ICD-10-CM | POA: Diagnosis not present

## 2022-01-05 DIAGNOSIS — L97529 Non-pressure chronic ulcer of other part of left foot with unspecified severity: Secondary | ICD-10-CM | POA: Diagnosis present

## 2022-01-05 DIAGNOSIS — E10621 Type 1 diabetes mellitus with foot ulcer: Secondary | ICD-10-CM | POA: Diagnosis present

## 2022-01-05 DIAGNOSIS — E1122 Type 2 diabetes mellitus with diabetic chronic kidney disease: Secondary | ICD-10-CM | POA: Diagnosis not present

## 2022-01-05 DIAGNOSIS — E1165 Type 2 diabetes mellitus with hyperglycemia: Secondary | ICD-10-CM | POA: Diagnosis not present

## 2022-01-05 DIAGNOSIS — E1065 Type 1 diabetes mellitus with hyperglycemia: Secondary | ICD-10-CM | POA: Diagnosis present

## 2022-01-05 DIAGNOSIS — I129 Hypertensive chronic kidney disease with stage 1 through stage 4 chronic kidney disease, or unspecified chronic kidney disease: Secondary | ICD-10-CM | POA: Diagnosis present

## 2022-01-05 DIAGNOSIS — I509 Heart failure, unspecified: Secondary | ICD-10-CM | POA: Diagnosis not present

## 2022-01-05 DIAGNOSIS — E1069 Type 1 diabetes mellitus with other specified complication: Secondary | ICD-10-CM | POA: Diagnosis present

## 2022-01-05 DIAGNOSIS — Z794 Long term (current) use of insulin: Secondary | ICD-10-CM | POA: Diagnosis not present

## 2022-01-05 DIAGNOSIS — L03116 Cellulitis of left lower limb: Secondary | ICD-10-CM | POA: Diagnosis present

## 2022-01-05 DIAGNOSIS — I130 Hypertensive heart and chronic kidney disease with heart failure and stage 1 through stage 4 chronic kidney disease, or unspecified chronic kidney disease: Secondary | ICD-10-CM | POA: Diagnosis not present

## 2022-01-05 DIAGNOSIS — Z87891 Personal history of nicotine dependence: Secondary | ICD-10-CM | POA: Diagnosis not present

## 2022-01-05 DIAGNOSIS — N183 Chronic kidney disease, stage 3 unspecified: Secondary | ICD-10-CM | POA: Diagnosis present

## 2022-01-05 DIAGNOSIS — E1043 Type 1 diabetes mellitus with diabetic autonomic (poly)neuropathy: Secondary | ICD-10-CM | POA: Diagnosis present

## 2022-01-05 DIAGNOSIS — Z7984 Long term (current) use of oral hypoglycemic drugs: Secondary | ICD-10-CM | POA: Diagnosis not present

## 2022-01-05 DIAGNOSIS — Z90711 Acquired absence of uterus with remaining cervical stump: Secondary | ICD-10-CM | POA: Diagnosis not present

## 2022-01-05 DIAGNOSIS — K3184 Gastroparesis: Secondary | ICD-10-CM | POA: Diagnosis present

## 2022-01-05 DIAGNOSIS — M86172 Other acute osteomyelitis, left ankle and foot: Secondary | ICD-10-CM | POA: Diagnosis not present

## 2022-01-05 DIAGNOSIS — L02612 Cutaneous abscess of left foot: Secondary | ICD-10-CM | POA: Diagnosis present

## 2022-01-05 DIAGNOSIS — E1022 Type 1 diabetes mellitus with diabetic chronic kidney disease: Secondary | ICD-10-CM | POA: Diagnosis present

## 2022-01-05 HISTORY — DX: Osteomyelitis, unspecified: M86.9

## 2022-01-05 HISTORY — DX: Unspecified mood (affective) disorder: F39

## 2022-01-05 LAB — TROPONIN I (HIGH SENSITIVITY): Troponin I (High Sensitivity): 4 ng/L (ref <18)

## 2022-01-05 LAB — GLUCOSE, CAPILLARY
Glucose-Capillary: 210 mg/dL — ABNORMAL HIGH (ref 70–99)
Glucose-Capillary: 247 mg/dL — ABNORMAL HIGH (ref 70–99)
Glucose-Capillary: 310 mg/dL — ABNORMAL HIGH (ref 70–99)
Glucose-Capillary: 147 mg/dL — ABNORMAL HIGH (ref 70–99)

## 2022-01-05 LAB — SURGICAL PCR SCREEN
MRSA, PCR: NEGATIVE
Staphylococcus aureus: NEGATIVE

## 2022-01-05 LAB — PREALBUMIN: Prealbumin: 23 mg/dL (ref 18–38)

## 2022-01-05 LAB — HIV ANTIBODY (ROUTINE TESTING W REFLEX): HIV Screen 4th Generation wRfx: NONREACTIVE

## 2022-01-05 LAB — C-REACTIVE PROTEIN: CRP: 0.6 mg/dL (ref <1.0)

## 2022-01-05 LAB — SEDIMENTATION RATE: Sed Rate: 35 mm/hr — ABNORMAL HIGH (ref 0–22)

## 2022-01-05 MED ORDER — SODIUM CHLORIDE 0.9% FLUSH
3.0000 mL | Freq: Two times a day (BID) | INTRAVENOUS | Status: DC
  Administered 2022-01-05 – 2022-01-07 (×5): 3 mL via INTRAVENOUS

## 2022-01-05 MED ORDER — INSULIN ASPART 100 UNIT/ML IJ SOLN
0.0000 [IU] | Freq: Three times a day (TID) | INTRAMUSCULAR | Status: DC
  Administered 2022-01-05: 5 [IU] via SUBCUTANEOUS
  Administered 2022-01-05: 11 [IU] via SUBCUTANEOUS
  Administered 2022-01-06: 3 [IU] via SUBCUTANEOUS

## 2022-01-05 MED ORDER — ALBUTEROL SULFATE HFA 108 (90 BASE) MCG/ACT IN AERS: 2.0000 | INHALATION_SPRAY | Freq: Three times a day (TID) | RESPIRATORY_TRACT | Status: DC | PRN

## 2022-01-05 MED ORDER — PROMETHAZINE HCL 25 MG RE SUPP
12.5000 mg | Freq: Four times a day (QID) | RECTAL | Status: DC | PRN
  Administered 2022-01-06: 25 mg via RECTAL
  Filled 2022-01-05 (×2): qty 1

## 2022-01-05 MED ORDER — AMLODIPINE BESYLATE 5 MG PO TABS
5.0000 mg | ORAL_TABLET | Freq: Every day | ORAL | Status: DC
  Administered 2022-01-05 – 2022-01-08 (×4): 5 mg via ORAL
  Filled 2022-01-05 (×4): qty 1

## 2022-01-05 MED ORDER — BISACODYL 5 MG PO TBEC: 5.0000 mg | DELAYED_RELEASE_TABLET | Freq: Every day | ORAL | Status: DC | PRN

## 2022-01-05 MED ORDER — OXYCODONE HCL 5 MG PO TABS
5.0000 mg | ORAL_TABLET | ORAL | Status: DC | PRN
  Filled 2022-01-05: qty 1

## 2022-01-05 MED ORDER — ONDANSETRON HCL 4 MG PO TABS: 4.0000 mg | ORAL_TABLET | Freq: Four times a day (QID) | ORAL | Status: DC | PRN

## 2022-01-05 MED ORDER — ONDANSETRON HCL 4 MG/2ML IJ SOLN
4.0000 mg | Freq: Four times a day (QID) | INTRAMUSCULAR | Status: DC | PRN
  Administered 2022-01-05 – 2022-01-08 (×5): 4 mg via INTRAVENOUS
  Filled 2022-01-05 (×5): qty 2

## 2022-01-05 MED ORDER — ACETAMINOPHEN 325 MG PO TABS
650.0000 mg | ORAL_TABLET | Freq: Four times a day (QID) | ORAL | Status: DC | PRN
  Administered 2022-01-05 – 2022-01-08 (×2): 650 mg via ORAL
  Filled 2022-01-05 (×2): qty 2

## 2022-01-05 MED ORDER — SODIUM CHLORIDE 0.9 % IV SOLN
2.0000 g | INTRAVENOUS | Status: DC
  Administered 2022-01-05 – 2022-01-06 (×2): 2 g via INTRAVENOUS
  Filled 2022-01-05 (×3): qty 20

## 2022-01-05 MED ORDER — ATORVASTATIN CALCIUM 80 MG PO TABS
80.0000 mg | ORAL_TABLET | Freq: Every day | ORAL | Status: DC
  Administered 2022-01-05 – 2022-01-08 (×4): 80 mg via ORAL
  Filled 2022-01-05 (×4): qty 1

## 2022-01-05 MED ORDER — POVIDONE-IODINE 10 % EX SWAB
2.0000 | Freq: Once | CUTANEOUS | Status: AC
  Administered 2022-01-06: 2 via TOPICAL

## 2022-01-05 MED ORDER — POLYETHYLENE GLYCOL 3350 17 G PO PACK: 17.0000 g | PACK | Freq: Every day | ORAL | Status: DC | PRN

## 2022-01-05 MED ORDER — SODIUM CHLORIDE 0.9 % IV SOLN
3.0000 g | Freq: Once | INTRAVENOUS | Status: AC
  Administered 2022-01-05: 3 g via INTRAVENOUS

## 2022-01-05 MED ORDER — INSULIN GLARGINE-YFGN 100 UNIT/ML ~~LOC~~ SOPN
20.0000 [IU] | PEN_INJECTOR | Freq: Every day | SUBCUTANEOUS | Status: DC
  Filled 2022-01-05: qty 3

## 2022-01-05 MED ORDER — ACETAMINOPHEN 325 MG PO TABS
650.0000 mg | ORAL_TABLET | ORAL | Status: DC | PRN
  Filled 2022-01-05: qty 2

## 2022-01-05 MED ORDER — FENTANYL CITRATE PF 50 MCG/ML IJ SOSY
12.5000 ug | PREFILLED_SYRINGE | INTRAMUSCULAR | Status: DC | PRN
  Administered 2022-01-05 (×5): 50 ug via INTRAVENOUS
  Administered 2022-01-05: 12.5 ug via INTRAVENOUS
  Administered 2022-01-06: 50 ug via INTRAVENOUS
  Filled 2022-01-05 (×7): qty 1

## 2022-01-05 MED ORDER — METHOCARBAMOL 750 MG PO TABS
750.0000 mg | ORAL_TABLET | ORAL | Status: DC | PRN
  Administered 2022-01-05 – 2022-01-07 (×3): 750 mg via ORAL
  Filled 2022-01-05 (×4): qty 1

## 2022-01-05 MED ORDER — ASPIRIN 81 MG PO TBEC
81.0000 mg | DELAYED_RELEASE_TABLET | Freq: Every day | ORAL | Status: DC
  Administered 2022-01-05 – 2022-01-08 (×4): 81 mg via ORAL
  Filled 2022-01-05 (×4): qty 1

## 2022-01-05 MED ORDER — VANCOMYCIN HCL 2000 MG/400ML IV SOLN
2000.0000 mg | Freq: Once | INTRAVENOUS | Status: AC
  Administered 2022-01-05: 2000 mg via INTRAVENOUS
  Filled 2022-01-05: qty 400

## 2022-01-05 MED ORDER — CEFAZOLIN SODIUM-DEXTROSE 2-4 GM/100ML-% IV SOLN
2.0000 g | INTRAVENOUS | Status: AC
  Administered 2022-01-06: 2 g via INTRAVENOUS
  Filled 2022-01-05: qty 100

## 2022-01-05 MED ORDER — GABAPENTIN 300 MG PO CAPS
600.0000 mg | ORAL_CAPSULE | Freq: Three times a day (TID) | ORAL | Status: DC
  Administered 2022-01-05 – 2022-01-08 (×9): 600 mg via ORAL
  Filled 2022-01-05 (×9): qty 2

## 2022-01-05 MED ORDER — MUPIROCIN 2 % EX OINT: 1.0000 | TOPICAL_OINTMENT | Freq: Two times a day (BID) | CUTANEOUS | Status: DC

## 2022-01-05 MED ORDER — VANCOMYCIN HCL 1500 MG/300ML IV SOLN
1500.0000 mg | INTRAVENOUS | Status: DC
  Administered 2022-01-06: 1500 mg via INTRAVENOUS
  Filled 2022-01-05 (×2): qty 300

## 2022-01-05 MED ORDER — HYDRALAZINE HCL 20 MG/ML IJ SOLN: 5.0000 mg | INTRAMUSCULAR | Status: DC | PRN

## 2022-01-05 MED ORDER — METRONIDAZOLE 500 MG/100ML IV SOLN
500.0000 mg | Freq: Three times a day (TID) | INTRAVENOUS | Status: DC
  Administered 2022-01-05 – 2022-01-07 (×6): 500 mg via INTRAVENOUS
  Filled 2022-01-05 (×6): qty 100

## 2022-01-05 MED ORDER — INSULIN GLARGINE-YFGN 100 UNIT/ML ~~LOC~~ SOLN
20.0000 [IU] | Freq: Every day | SUBCUTANEOUS | Status: DC
  Administered 2022-01-05 – 2022-01-07 (×3): 20 [IU] via SUBCUTANEOUS
  Filled 2022-01-05 (×5): qty 0.2

## 2022-01-05 MED ORDER — INSULIN ASPART 100 UNIT/ML IJ SOLN: 0.0000 [IU] | Freq: Every day | INTRAMUSCULAR | Status: DC

## 2022-01-05 MED ORDER — BUSPIRONE HCL 15 MG PO TABS: 15.0000 mg | ORAL_TABLET | ORAL | Status: DC | PRN

## 2022-01-05 MED ORDER — DOCUSATE SODIUM 100 MG PO CAPS
100.0000 mg | ORAL_CAPSULE | Freq: Two times a day (BID) | ORAL | Status: DC
  Administered 2022-01-07 – 2022-01-08 (×2): 100 mg via ORAL
  Filled 2022-01-05 (×4): qty 1

## 2022-01-05 MED ORDER — ACETAMINOPHEN 650 MG RE SUPP: 650.0000 mg | Freq: Four times a day (QID) | RECTAL | Status: DC | PRN

## 2022-01-05 MED ORDER — ALBUTEROL SULFATE (2.5 MG/3ML) 0.083% IN NEBU: 2.5000 mg | INHALATION_SOLUTION | Freq: Four times a day (QID) | RESPIRATORY_TRACT | Status: DC | PRN

## 2022-01-05 MED ORDER — CHLORHEXIDINE GLUCONATE 4 % EX LIQD
60.0000 mL | Freq: Once | CUTANEOUS | Status: AC
  Administered 2022-01-06: 4 via TOPICAL
  Filled 2022-01-05 (×2): qty 60

## 2022-01-05 MED ORDER — SODIUM CHLORIDE 0.9 % IV SOLN: INTRAVENOUS | Status: DC

## 2022-01-05 MED ORDER — HYDROMORPHONE HCL 1 MG/ML IJ SOLN
1.0000 mg | Freq: Once | INTRAMUSCULAR | Status: AC
  Administered 2022-01-05: 1 mg via INTRAVENOUS
  Filled 2022-01-05: qty 1

## 2022-01-05 MED ORDER — HYDROMORPHONE HCL 1 MG/ML IJ SOLN
0.5000 mg | Freq: Once | INTRAMUSCULAR | Status: AC | PRN
  Administered 2022-01-05: 0.5 mg via INTRAVENOUS
  Filled 2022-01-05: qty 0.5

## 2022-01-05 NOTE — ED Notes (Signed)
Attempted to give report to receiving nurse at [facility] but was unable to take it at this time. Secretary stated she will ask her to call us for report when available

## 2022-01-05 NOTE — ED Notes (Signed)
Report given to [facility] with [facility]. ETA approximately 20 min

## 2022-01-05 NOTE — H&P (Signed)
History and Physical    Patient: [name] CWU:[id number] DOB: February 05, 1966 DOA: 01/04/2022 DOS: the patient was seen and examined on 01/05/2022 PCP: Mackie Pai, PA-C  Patient coming from: Home - lives alone; NOK: Freddy Finner, [phone]   Chief Complaint: Foot wound  HPI: [name] is a 56 y.o. female with medical history significant of DM with neuropathy and gastroparesis, CAD, chronic pain, HTN, and HLD presenting with an unhealing foot wound.  She reports that she had some chest pain that were sharp for about 3 days.  Then the foot pain worsened for about 3 days. The foot started smelling bad for 2-3 days.  No fever.  Bad headache.  She has been followed by Dr. Sharol Given for the foot issue, 2 rounds of antibiotics.  The pain just recently started worsening.  The hole is deeper than prior.  MRI on 11/14 with concern for osteo.  Takes medication and insulin daily, thinks sugars are reasonably controlled (197, 210).      ER Course:  [facility] transfer, per Dr. Nevada Crane:  Left diabetic foot ulcer for several months with drainage, followed by orthopedic surgery Dr. Sharol Given, with recent left foot MRI (12/24/21) which revealed findings concerning for osteomyelitis.  The patient has been on Augmentin outpatient but her left foot wound continued to worsen.  Additionally, the patient had complained of chest pain.  High-sensitivity troponin was negative x 2.  There was no evidence of acute ischemia on twelve-lead EKG.     EDP requested admission for IV antibiotics and local wound care.  Please consult orthopedic surgery, Dr. Sharol Given, once the patient arrives at [facility].     Review of Systems: As mentioned in the history of present illness. All other systems reviewed and are negative. Past Medical History:  Diagnosis Date   Anxiety 01/22/2016   Cardiac murmur 02/01/2019   Cardiomyopathy, secondary ([place]) 09/06/2009   Qualifier: Diagnosis of  By: Arvid Right   Formatting of this note might be different from the original. Overview:  Qualifier: Diagnosis of  By: Arvid Right   Chronic kidney disease, stage III (moderate) ([place]) 05/23/2014   Chronic pain syndrome 01/23/2016   Coronary artery disease    30% lesions noted   Depression    Diabetes type 2, uncontrolled 05/23/2014   Diabetic gastroparesis associated with type 1 diabetes mellitus ([place]) 11/27/2016   DIABETIC PERIPHERAL NEUROPATHY 09/21/2009   Qualifier: Diagnosis of  By: Amil Amen MD, [place]     Essential hypertension 08/30/2009   Qualifier: Diagnosis of  By: Lovette Cliche, CNA, Christy     Gastroesophageal reflux disease 07/30/2009   Formatting of this note might be different from the original. Overview:  Overview:  Qualifier: Diagnosis of  By: Amil Amen MD, Elby Showers: Diagnosis of  By: Chester Holstein NP, Ellwood Handler of this note might be different from the original. Overview:  Qualifier: Diagnosis of  By: Amil Amen MD, Elby Showers: Diagnosis of  By: Chester Holstein NP, Nevin Bloodgood   GERD (gastroesophageal reflux disease)    History of cervical cancer 08/17/2015   Status post partial hysterectomy  Formatting of this note might be different from the original. Status post partial hysterectomy   Hyperlipidemia    INSOMNIA 09/21/2009   Qualifier: Diagnosis of  By: Amil Amen MD, [name]     Lumbar facet arthropathy 05/14/2016   Lumbar spinal stenosis 02/11/2018   Metatarsalgia of both feet 03/11/2017   Migraine 09/13/2012   Overview:  IMPRESSION: possible abd migraine   TOBACCO ABUSE 09/21/2009   Qualifier: Diagnosis of  By: Amil Amen MD, Winona Legato of this note might be different from the original. Overview:  Qualifier: Diagnosis of  By: Amil Amen MD, [name]   Trigeminal neuralgia    ULCER-GASTRIC 09/28/2009   Qualifier: Diagnosis of  By: Chester Holstein NP, Lyn Records, CANDIDAL 12/25/2009   Qualifier: Diagnosis of  By: Amil Amen MD,  Tessa Lerner D deficiency 04/25/2013   Past Surgical History:  Procedure Laterality Date   ABDOMINAL HYSTERECTOMY     partial   CHOLECYSTECTOMY     I & D EXTREMITY Left 04/24/2021   Procedure: LEFT LEG DEBRIDEMENT;  Surgeon: [name], MD;  Location: [place];  Service: Orthopedics;  Laterality: Left;   I & D EXTREMITY Left 05/01/2021   Procedure: LEFT KNEE DEBRIDEMENT;  Surgeon: [name], MD;  Location: [place];  Service: Orthopedics;  Laterality: Left;   Social History:  reports that she quit smoking about 33 years ago. Her smoking use included cigarettes. She has never used smokeless tobacco. She reports current alcohol use. She reports that she does not use drugs.  Allergies  Allergen Reactions   Insulin Aspart Other (See Comments) and Swelling    adverse reaction to Novolog    Oxycodone-Acetaminophen Nausea Only   Latex Itching, Swelling and Rash   Morphine Itching and Rash    Family History  Problem Relation Age of Onset   Diabetes Mother    Diabetes Father     Prior to Admission medications   Medication Sig Start Date End Date Taking? Authorizing Provider  albuterol (VENTOLIN HFA) 108 (90 Base) MCG/ACT inhaler Inhale 2 puffs into the lungs every 6 (six) hours as needed for wheezing or shortness of breath. 08/03/20   Saguier, Percell Miller, PA-C  amLODipine (NORVASC) 5 MG tablet Take 1 tablet (5 mg total) by mouth daily. 11/19/21 11/14/22  Saguier, Percell Miller, PA-C  amoxicillin-clavulanate (AUGMENTIN) 500-125 MG tablet Take 1 tablet (500 mg total) by mouth 3 (three) times daily. 11/13/21   Suzan Slick, NP  aspirin 81 MG tablet Take 81 mg by mouth daily.    [provider]  atorvastatin (LIPITOR) 80 MG tablet Take 1 tablet (80 mg total) by mouth daily. 08/21/21   Saguier, Percell Miller, PA-C  azelastine (ASTELIN) 0.1 % nasal spray Place 2 sprays into both nostrils 2 (two) times daily. 04/09/20   Colon Branch, MD  busPIRone (BUSPAR) 15 MG tablet Take 1 tablet (15 mg total)  by mouth 2 (two) times daily. 08/21/21   Saguier, Percell Miller, PA-C  carvedilol (COREG) 25 MG tablet Take 1 tablet (25 mg total) by mouth 2 (two) times daily with a meal. 07/23/21   Saguier, Percell Miller, PA-C  ciprofloxacin (CIPRO) 500 MG tablet Take 1 tablet (500 mg total) by mouth 2 (two) times daily. 08/02/21   Saguier, Percell Miller, PA-C  doxycycline (VIBRA-TABS) 100 MG tablet Take 1 tablet (100 mg total) by mouth 2 (two) times daily. Patient not taking: Reported on 10/20/2021 09/13/21   [name], MD  Dulaglutide (TRULICITY) 6.23 JS/2.8BT SOPN Inject 0.75 mg into the skin once a week. 09/06/21   Saguier, Percell Miller, PA-C  famotidine (PEPCID) 20 MG tablet Take 1 tablet (20 mg total) by mouth daily. 04/03/21   Saguier, Percell Miller, PA-C  fluticasone (FLONASE) 50 MCG/ACT nasal spray Place 2 sprays into both nostrils daily. 09/27/19   Saguier, Percell Miller, PA-C  fluticasone furoate-vilanterol (  BREO ELLIPTA) 100-25 MCG/INH AEPB Inhale 1 puff into the lungs daily. 08/03/20   Saguier, Percell Miller, PA-C  gabapentin (NEURONTIN) 300 MG capsule Take 2 capsules (600 mg total) by mouth 3 (three) times daily. 09/30/21   Saguier, Percell Miller, PA-C  insulin glargine-yfgn (SEMGLEE) 100 UNIT/ML Pen Inject 40 Units into the skin at bedtime. 06/14/21   Saguier, Percell Miller, PA-C  meloxicam (MOBIC) 7.5 MG tablet Take 7.5 mg by mouth 2 (two) times daily.    [provider]  metFORMIN (GLUCOPHAGE-XR) 500 MG 24 hr tablet Take 1 tablet (500 mg total) by mouth daily with breakfast. 11/19/21   Saguier, Percell Miller, PA-C  methocarbamol (ROBAXIN) 750 MG tablet Take 1 tablet (750 mg total) by mouth every 8 (eight) hours as needed for muscle spasms. 12/30/21   Saguier, Percell Miller, PA-C  mupirocin ointment (BACTROBAN) 2 % Apply 1 Application topically 2 (two) times daily. 11/13/21   Suzan Slick, NP  nitroGLYCERIN (NITROSTAT) 0.4 MG SL tablet Place 1 tablet (0.4 mg total) under the tongue every 5 (five) minutes as needed for chest pain. 03/21/21   Saguier, Percell Miller, PA-C  oxyCODONE  (OXY IR/ROXICODONE) 5 MG immediate release tablet Take 1 tablet (5 mg total) by mouth every 6 (six) hours as needed for severe pain. 1 tab po bid prn severe pain 12/24/21   [name], MD  oxyCODONE-acetaminophen (PERCOCET/ROXICET) 5-325 MG tablet Take 1 tablet by mouth every 6 (six) hours as needed for severe pain. 11/29/21   [name], MD  phenazopyridine (PYRIDIUM) 100 MG tablet Take 1 tablet (100 mg total) by mouth 3 (three) times daily as needed for pain. Patient not taking: Reported on 10/20/2021 07/26/21   Shelda Pal, DO  promethazine (PHENERGAN) 12.5 MG tablet Take 1 tablet (12.5 mg total) by mouth every 8 (eight) hours as needed for nausea or vomiting. 11/19/21   Saguier, Percell Miller, PA-C  sertraline (ZOLOFT) 100 MG tablet Take 1 tablet (100 mg total) by mouth daily. 08/21/21   Saguier, Percell Miller, PA-C  sulfamethoxazole-trimethoprim (BACTRIM DS) 800-160 MG tablet Take 1 tablet by mouth 2 (two) times daily. 12/19/21   [name], MD  topiramate (TOPAMAX) 50 MG tablet Take 1 tablet (50 mg total) by mouth 2 (two) times daily. Patient taking differently: Take 50 mg by mouth 2 (two) times daily as needed (headache). 04/17/20   Penni Bombard, MD    Physical Exam: Vitals:   01/05/22 0256 01/05/22 0300 01/05/22 0530 01/05/22 0730  BP:  121/78 132/77 131/79  Pulse:  86 84 86  Resp:  18 18 (!) 22  Temp: 98.1 F (36.7 C)  98.2 F (36.8 C)   TempSrc: Oral     SpO2:  100% 100% 98%  Weight:      Height:       General:  Appears calm and comfortable and is in NAD Eyes:   EOMI, normal lids, iris ENT:  grossly normal hearing, lips & tongue, mmm Neck:  no LAD, masses or thyromegaly Cardiovascular:  RRR, no m/r/g. No LE edema.  Respiratory:   CTA bilaterally with no wheezes/rales/rhonchi.  Normal respiratory effort. Abdomen:  soft, NT, ND Skin:  ulceration on left plantar 2nd metatarsal head      Musculoskeletal:  grossly normal tone BUE/BLE, good ROM, no bony  abnormality Psychiatric:  grossly normal mood and affect, speech fluent and appropriate, AOx3 Neurologic:  CN 2-12 grossly intact, moves all extremities in coordinated fashion   Radiological Exams on Admission: Independently reviewed - see discussion  in A/P where applicable  DG Foot Complete Left  Result Date: 01/05/2022 CLINICAL DATA:  Left foot wound for several months, no known injury, initial encounter EXAM: LEFT FOOT - COMPLETE 3+ VIEW COMPARISON:  11/13/2021 FINDINGS: Soft tissue defect is noted adjacent to the base of the second proximal phalanx consistent with the known plantar wound. This does not extend to the bony structures and no erosive changes to suggest osteomyelitis are noted. Postsurgical changes in the first metatarsal are noted distally stable from prior exams. Chronic deformity of the head of the third metatarsal is seen. Generalized soft tissue swelling is noted in the distal aspect of the foot. IMPRESSION: Soft tissue wound without evidence of osteomyelitis. Electronically Signed   By: Inez Catalina M.D.   On: 01/05/2022 00:03   DG Chest 2 View  Result Date: 01/04/2022 CLINICAL DATA:  Chest pain for several days EXAM: CHEST - 2 VIEW COMPARISON:  04/22/2021 FINDINGS: The heart size and mediastinal contours are within normal limits. Both lungs are clear. The visualized skeletal structures are unremarkable. IMPRESSION: No active cardiopulmonary disease. Electronically Signed   By: Inez Catalina M.D.   On: 01/04/2022 20:22    EKG: Independently reviewed.  NSR with rate 97; PVCs  with no evidence of acute ischemia   Labs on Admission: I have personally reviewed the available labs and imaging studies at the time of the admission.  Pertinent labs:    Glucose 209 BUN 22/Creatinine 1.39/GFR 44 HS troponin 5, 4 Normal CBC Upreg negative   Assessment and Plan: Principal Problem:   Osteomyelitis of second toe of left foot (HCC) Active Problems:   Uncontrolled type 2  diabetes mellitus with hyperglycemia, without long-term current use of insulin (HCC)   Chronic pain syndrome   Coronary artery disease   Hyperlipidemia   Hypertension   Mood disorder (HCC)    Diabetic foot ulcer -Prior foot MRI on 11/14 with concern for abscess and likely osteo at the 2nd MTP -Patient with h/o prior antibiotic treatment with Augmentin and Bactrim recently and so has failed outpatient management -Foot ulcer is present and draining and now the patient has developed surrounding cellulitis -No current concerns for sepsis -Will treat with IV antibiotics (Rocephin/Flagyl/Vanc as per the lower extremity wound algorithm) -Orthopedics will consult -Based on refractory ulcer infection with osteitis, it seems that the patient may be heading for amputation -I have ordered ABIs in case revascularization may be indicated -Patient is NPO after midnight in case she needs a procedure tomorrow -LE wound order set utilized including labs (CRP, ESR, prealbumin, HIV, and blood cultures) and consults (diabetes coordinator; peripheral vascular navigator; TOC team; wound care; and nutrition)  -Continue Neurontin  DM -Last A1c was 9.8, indicating poor control -Will check A1c -hold Glucophage -Continue glargine -Cover with moderate-scale SSI  -DM coordinator consulted  HTN -Continue amlodipine -Reports that she is not currently taking carvedilol  HLD -Continue atorvastatin  CAD -Continue ASA  Mood d/o -Continue buspirone  Chronic pain -I have reviewed this patient in the [place] Controlled Substances Reporting System. She is receiving medications from only one provider and appears to be taking them as prescribed. -She is at increased risk of opioid misuse, diversion, or overdose.  -Continue methocarbamol, Fentanyl as needed for pain    Advance Care Planning:   Code Status: Full Code   Consults: Orthopedics; diabetes coordinator; peripheral vascular navigator; TOC team; wound  care; and nutrition  DVT Prophylaxis: SCDs  Family Communication: None present; she is capable of  communicating with family at this time  Severity of Illness: The appropriate patient status for this patient is INPATIENT. Inpatient status is judged to be reasonable and necessary in order to provide the required intensity of service to ensure the patient's safety. The patient's presenting symptoms, physical exam findings, and initial radiographic and laboratory data in the context of their chronic comorbidities is felt to place them at high risk for further clinical deterioration. Furthermore, it is not anticipated that the patient will be medically stable for discharge from the hospital within 2 midnights of admission.   * I certify that at the point of admission it is my clinical judgment that the patient will require inpatient hospital care spanning beyond 2 midnights from the point of admission due to high intensity of service, high risk for further deterioration and high frequency of surveillance required.*  Author: Karmen Bongo, MD 01/05/2022 2:55 PM  For on call review [email or web address].

## 2022-01-05 NOTE — Progress Notes (Signed)
Notified by CCMD that patient had ST elevation in lead II. EKG obtained, patient c/o mild chest pain but reports feeling anxious about infection and upcoming procedure. Dr. Lorin Mercy made aware.

## 2022-01-05 NOTE — Progress Notes (Addendum)
Plan of Care Note for accepted transfer   Patient: [name] MRN: [id number]   DOA: 01/04/2022  Facility requesting transfer: [facility]. Requesting Provider: Dr. Stark Jock, EDP. Reason for transfer: Left foot osteomyelitis.   Facility course: 56 year old female with history of insulin-dependent type 2 diabetes, diabetic polyneuropathy, left diabetic foot ulcer for several months with drainage, followed by orthopedic surgery Dr. Sharol Given, with recent left foot MRI (12/24/21) which revealed findings concerning for osteomyelitis.  The patient has been on Augmentin outpatient but her left foot wound continued to worsen.  Additionally, the patient had complained of chest pain.  High-sensitivity troponin was negative x 2.  There was no evidence of acute ischemia on twelve-lead EKG.    EDP requested admission for IV antibiotics and local wound care.  Please consult orthopedic surgery, Dr. Sharol Given, once the patient arrives at [place] of care: The patient is accepted for admission to Telemetry surgical unit, at [facility] as inpatient status.   Author: Kayleen Memos, DO 01/05/2022  Check [email or web address] for on-call coverage.  Nursing staff, Please call [place] number on Amion as soon as patient's arrival, so appropriate admitting provider can evaluate the pt.

## 2022-01-05 NOTE — ED Notes (Signed)
Patient declined pain medication at this time. [facility] at bedside

## 2022-01-05 NOTE — ED Notes (Signed)
Pt care taken, no complaints at this time. 

## 2022-01-05 NOTE — ED Notes (Signed)
Pt indicated that her pain is greatest in her left foot. She has a wound on the bottom of her foot that she reports is draining and "now is smelling."  She is able to walk on her foot but does cause her pain.

## 2022-01-05 NOTE — H&P (View-Only) (Signed)
ORTHOPAEDIC CONSULTATION  REQUESTING PHYSICIAN: Karmen Bongo, MD  Chief Complaint: Painful ulceration left foot second metatarsal head.  HPI: [name] is a 56 y.o. female who presents with painful ulceration and left second toe.  Patient has had a chronic ulcer beneath the second metatarsal head has undergone prolonged conservative wound care and presents at this time with cellulitis swelling and bruising around the second toe MTP joint.  Past Medical History:  Diagnosis Date   Anxiety 01/22/2016   Cardiac murmur 02/01/2019   Cardiomyopathy, secondary ([place]) 09/06/2009   Qualifier: Diagnosis of  By: Arvid Right   Formatting of this note might be different from the original. Overview:  Qualifier: Diagnosis of  By: Arvid Right   Chronic kidney disease, stage III (moderate) ([place]) 05/23/2014   Chronic pain syndrome 01/23/2016   Coronary artery disease    30% lesions noted   Depression    Diabetes type 2, uncontrolled 05/23/2014   Diabetic gastroparesis associated with type 1 diabetes mellitus ([place]) 11/27/2016   DIABETIC PERIPHERAL NEUROPATHY 09/21/2009   Qualifier: Diagnosis of  By: Amil Amen MD, [place]     Essential hypertension 08/30/2009   Qualifier: Diagnosis of  By: Lovette Cliche, CNA, Christy     Gastroesophageal reflux disease 07/30/2009   Formatting of this note might be different from the original. Overview:  Overview:  Qualifier: Diagnosis of  By: Amil Amen MD, Elby Showers: Diagnosis of  By: Chester Holstein NP, Ellwood Handler of this note might be different from the original. Overview:  Qualifier: Diagnosis of  By: Amil Amen MD, Elby Showers: Diagnosis of  By: Chester Holstein NP, Nevin Bloodgood   GERD (gastroesophageal reflux disease)    History of cervical cancer 08/17/2015   Status post partial hysterectomy  Formatting of this note might be different from the original. Status post partial hysterectomy   Hyperlipidemia    INSOMNIA  09/21/2009   Qualifier: Diagnosis of  By: Amil Amen MD, [name]     Lumbar facet arthropathy 05/14/2016   Lumbar spinal stenosis 02/11/2018   Metatarsalgia of both feet 03/11/2017   Migraine 09/13/2012   Overview:  IMPRESSION: possible abd migraine   TOBACCO ABUSE 09/21/2009   Qualifier: Diagnosis of  By: Amil Amen MD, Winona Legato of this note might be different from the original. Overview:  Qualifier: Diagnosis of  By: Amil Amen MD, [name]   Trigeminal neuralgia    ULCER-GASTRIC 09/28/2009   Qualifier: Diagnosis of  By: Chester Holstein NP, Lyn Records, CANDIDAL 12/25/2009   Qualifier: Diagnosis of  By: Amil Amen MD, Tessa Lerner D deficiency 04/25/2013   Past Surgical History:  Procedure Laterality Date   ABDOMINAL HYSTERECTOMY     partial   CHOLECYSTECTOMY     I & D EXTREMITY Left 04/24/2021   Procedure: LEFT LEG DEBRIDEMENT;  Surgeon: [name], MD;  Location: [facility];  Service: Orthopedics;  Laterality: Left;   I & D EXTREMITY Left 05/01/2021   Procedure: LEFT KNEE DEBRIDEMENT;  Surgeon: [name], MD;  Location: [facility];  Service: Orthopedics;  Laterality: Left;   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  Tobacco Use   Smoking status: Former  Types: Cigarettes    Quit date: 1    Years since quitting: 33.9   Smokeless tobacco: Never  Vaping Use   Vaping Use: Never used  Substance and Sexual Activity   Alcohol use: Yes    Comment: occasionally   Drug use: No   Sexual activity: Not on file  Other Topics Concern   Not on file  Social History Narrative   Not on file   Social Determinants of Health   Financial Resource Strain: Not on file  Food Insecurity: Not on file  Transportation Needs: Not on file  Physical  Activity: Not on file  Stress: Not on file  Social Connections: Not on file   Family History  Problem Relation Age of Onset   Diabetes Mother    Diabetes Father    - negative except otherwise stated in the family history section Allergies  Allergen Reactions   Insulin Aspart Other (See Comments) and Swelling    adverse reaction to Novolog    Oxycodone-Acetaminophen Nausea Only   Latex Itching, Swelling and Rash   Morphine Itching and Rash   Prior to Admission medications   Medication Sig Start Date End Date Taking? Authorizing Provider  albuterol (VENTOLIN HFA) 108 (90 Base) MCG/ACT inhaler Inhale 2 puffs into the lungs every 6 (six) hours as needed for wheezing or shortness of breath. Patient taking differently: Inhale 2 puffs into the lungs 3 (three) times daily as needed for wheezing or shortness of breath. 08/03/20  Yes Saguier, Percell Miller, PA-C  amLODipine (NORVASC) 5 MG tablet Take 1 tablet (5 mg total) by mouth daily. 11/19/21 11/14/22 Yes Saguier, Percell Miller, PA-C  aspirin 81 MG tablet Take 81 mg by mouth daily.   Yes [provider]  atorvastatin (LIPITOR) 80 MG tablet Take 1 tablet (80 mg total) by mouth daily. 08/21/21  Yes Saguier, Percell Miller, PA-C  busPIRone (BUSPAR) 15 MG tablet Take 1 tablet (15 mg total) by mouth 2 (two) times daily. Patient taking differently: Take 15-30 mg by mouth as needed (anxiety). 08/21/21  Yes Saguier, Percell Miller, PA-C  fluticasone (FLONASE) 50 MCG/ACT nasal spray Place 2 sprays into both nostrils daily. Patient taking differently: Place 1 spray into both nostrils 2 (two) times daily as needed for allergies. 09/27/19  Yes Saguier, Percell Miller, PA-C  gabapentin (NEURONTIN) 300 MG capsule Take 2 capsules (600 mg total) by mouth 3 (three) times daily. 09/30/21  Yes Saguier, Percell Miller, PA-C  metFORMIN (GLUCOPHAGE-XR) 500 MG 24 hr tablet Take 1 tablet (500 mg total) by mouth daily with breakfast. 11/19/21  Yes Saguier, Percell Miller, PA-C  methocarbamol (ROBAXIN) 750 MG tablet  Take 1 tablet (750 mg total) by mouth every 8 (eight) hours as needed for muscle spasms. Patient taking differently: Take 750 mg by mouth as needed for muscle spasms. 12/30/21  Yes Saguier, Percell Miller, PA-C  nitroGLYCERIN (NITROSTAT) 0.4 MG SL tablet Place 1 tablet (0.4 mg total) under the tongue every 5 (five) minutes as needed for chest pain. 03/21/21  Yes Saguier, Percell Miller, PA-C  oxyCODONE (OXY IR/ROXICODONE) 5 MG immediate release tablet Take 1 tablet (5 mg total) by mouth every 6 (six) hours as needed for severe pain. 1 tab po bid prn severe pain Patient taking differently: Take 5 mg by mouth 2 (two) times daily as needed for severe pain. 12/24/21  Yes [name], MD  promethazine (PHENERGAN) 12.5 MG tablet Take 1 tablet (12.5 mg total) by mouth every 8 (eight) hours as needed for nausea or vomiting. Patient taking differently: Take 12.5  mg by mouth as needed for nausea or vomiting. 11/19/21  Yes Saguier, Percell Miller, PA-C  topiramate (TOPAMAX) 50 MG tablet Take 1 tablet (50 mg total) by mouth 2 (two) times daily. Patient taking differently: Take 50 mg by mouth daily as needed (headache). 04/17/20  Yes Penumalli, Earlean Polka, MD  VITAMIN D PO Take 1 capsule by mouth daily.   Yes [provider]  carvedilol (COREG) 25 MG tablet Take 1 tablet (25 mg total) by mouth 2 (two) times daily with a meal. Patient not taking: Reported on 01/05/2022 07/23/21   Saguier, Percell Miller, PA-C  fluticasone furoate-vilanterol (BREO ELLIPTA) 100-25 MCG/INH AEPB Inhale 1 puff into the lungs daily. Patient not taking: Reported on 01/05/2022 08/03/20   Saguier, Percell Miller, PA-C  insulin glargine-yfgn (SEMGLEE) 100 UNIT/ML Pen Inject 40 Units into the skin at bedtime. Patient not taking: Reported on 01/05/2022 06/14/21   Mackie Pai, PA-C   DG Foot Complete Left  Result Date: 01/05/2022 CLINICAL DATA:  Left foot wound for several months, no known injury, initial encounter EXAM: LEFT FOOT - COMPLETE 3+ VIEW COMPARISON:  11/13/2021  FINDINGS: Soft tissue defect is noted adjacent to the base of the second proximal phalanx consistent with the known plantar wound. This does not extend to the bony structures and no erosive changes to suggest osteomyelitis are noted. Postsurgical changes in the first metatarsal are noted distally stable from prior exams. Chronic deformity of the head of the third metatarsal is seen. Generalized soft tissue swelling is noted in the distal aspect of the foot. IMPRESSION: Soft tissue wound without evidence of osteomyelitis. Electronically Signed   By: Inez Catalina M.D.   On: 01/05/2022 00:03   DG Chest 2 View  Result Date: 01/04/2022 CLINICAL DATA:  Chest pain for several days EXAM: CHEST - 2 VIEW COMPARISON:  04/22/2021 FINDINGS: The heart size and mediastinal contours are within normal limits. Both lungs are clear. The visualized skeletal structures are unremarkable. IMPRESSION: No active cardiopulmonary disease. Electronically Signed   By: Inez Catalina M.D.   On: 01/04/2022 20:22   - pertinent xrays, CT, MRI studies were reviewed and independently interpreted  Positive ROS: All other systems have been reviewed and were otherwise negative with the exception of those mentioned in the HPI and as above.  Physical Exam: General: Alert, no acute distress Psychiatric: Patient is competent for consent with normal mood and affect Lymphatic: No axillary or cervical lymphadenopathy Cardiovascular: No pedal edema Respiratory: No cyanosis, no use of accessory musculature GI: No organomegaly, abdomen is soft and non-tender    Images:  '@ENCIMAGES'$ @  Labs:  Lab Results  Component Value Date   HGBA1C 9.8 (H) 08/26/2021   HGBA1C 12.3 (H) 04/23/2021   HGBA1C 7.9 (H) 08/24/2020   ESRSEDRATE 35 (H) 01/05/2022   ESRSEDRATE 41 (H) 08/24/2020   REPTSTATUS 05/23/2021 FINAL 05/21/2021   GRAMSTAIN  05/01/2021    FEW WBC PRESENT,BOTH PMN AND MONONUCLEAR NO ORGANISMS SEEN    CULT >=100,000 COLONIES/mL  SERRATIA MARCESCENS (A) 05/21/2021   LABORGA SERRATIA MARCESCENS (A) 05/21/2021    Lab Results  Component Value Date   ALBUMIN 4.0 08/26/2021   ALBUMIN 3.9 05/28/2021   ALBUMIN 3.1 (L) 05/22/2021   PREALBUMIN 23 01/05/2022        Latest Ref Rng & Units 01/04/2022    7:33 PM 08/26/2021    3:04 PM 06/14/2021   11:39 AM  CBC EXTENDED  WBC 4.0 - 10.5 K/uL 6.3  7.3  4.6   RBC 3.87 -  5.11 MIL/uL 5.02  4.21  4.33   Hemoglobin 12.0 - 15.0 g/dL 14.6  12.3  12.9   HCT 36.0 - 46.0 % 45.4  37.1  40.3   Platelets 150 - 400 K/uL 360  260.0  327   NEUT# 1.4 - 7.7 K/uL  3.3  1,504   Lymph# 0.7 - 4.0 K/uL  2.6  2,245     Neurologic: Patient does not have protective sensation bilateral lower extremities.   MUSCULOSKELETAL:   Skin: Examination patient has swelling cellulitis and a plantar ulcer beneath the second MTP joint.  Patient has a strong dorsalis pedis pulse.  There is no ascending cellulitis.  Review of the MRI scan shows edema at the base of the proximal phalanx second toe as well as edema around the MTP joint.  Patient's white cell count is 6.3 hemoglobin A1c 9.8 and before that was 12.3.    Assessment: Assessment: Septic MTP joint left foot second toe with osteomyelitis base of the proximal phalanx second toe.  Plan: Discussed with the patient the least invasive procedure would be to proceed with a left foot second ray amputation.  Risks and benefits were discussed including need for additional surgery potential need for transmetatarsal amputation.  Discussed the importance of strict nonweightbearing to ensure wound healing.  Patient states she understands wishes to proceed at this time.  Will try to get her on for surgery Monday.  Thank you for the consult and the opportunity to see Ms. Nehal Witting, [place] [phone] 1:20 PM

## 2022-01-05 NOTE — Progress Notes (Signed)
Pharmacy Antibiotic Note  [name] is a 56 y.o. female admitted on 01/04/2022 with lower extremity wound infection, high risk for MRSA. Of note, patient with left diabetic foot ulcer for several months with drainage, followed by orthopedic surgery Dr. Sharol Given, with recent left foot MRI (12/24/21) with findings concerning for osteomyelitis. Patient presented to [facility] on 11/25 with complaints of headache, chest pain, and pain and swelling in her left foot. Patient reports having been treated with Augmentin (10/4) and Bactrim DS (10/26, 11/9) in the past, but is not on any antibiotics currently. Patient is afebrile, WBC 6.3. Patient received Unasyn 3 g IV x 1 in the ED at [facility]. Patient was transferred to [facility] and has been started on Ceftriaxone and Metronidazole during this admission. Pharmacy has been consulted for Vancomycin dosing.  Plan: Vancomycin 2000 mg IV x 1, followed by 1500 mg IV q24h  (Scr 1.39, eAUC 512.4, goal AUC 400-550) Ceftriaxone 2 g IV q24h Metronidazole 500 mg IV q8h  Follow up clinical course, LOT, de-escalate as able Monitor renal function and obtain Vancomycin levels as clinically indicated    Height: 6' (182.9 cm) Weight: 86.2 kg (190 lb) IBW/kg (Calculated) : 73.1  Temp (24hrs), Avg:98.1 F (36.7 C), Min:98 F (36.7 C), Max:98.2 F (36.8 C)  Recent Labs  Lab 01/04/22 1933  WBC 6.3  CREATININE 1.39*    Estimated Creatinine Clearance: 51.5 mL/min (A) (by C-G formula based on SCr of 1.39 mg/dL (H)).    Allergies  Allergen Reactions   Insulin Aspart Other (See Comments) and Swelling    adverse reaction to Novolog    Oxycodone-Acetaminophen Nausea Only   Latex Itching, Swelling and Rash   Morphine Itching and Rash    Antimicrobials this admission: Unasyn 11/26 x 1 Vancomycin 11/26 >>  Ceftriaxone 11/26 >>  Metronidazole 11/26 >>  Dose adjustments this admission:   Microbiology results:   Thank you for allowing pharmacy to be a part of this  patient's care.  Vance Peper, PharmD PGY-2 Pharmacy Resident Phone [phone] 01/05/2022 10:20 AM   Please check AMION for all [place] phone numbers After 10:00 PM, call [place] [phone]

## 2022-01-05 NOTE — Consult Note (Signed)
ORTHOPAEDIC CONSULTATION  REQUESTING PHYSICIAN: Karmen Bongo, MD  Chief Complaint: Painful ulceration left foot second metatarsal head.  HPI: [name] is a 56 y.o. female who presents with painful ulceration and left second toe.  Patient has had a chronic ulcer beneath the second metatarsal head has undergone prolonged conservative wound care and presents at this time with cellulitis swelling and bruising around the second toe MTP joint.  Past Medical History:  Diagnosis Date   Anxiety 01/22/2016   Cardiac murmur 02/01/2019   Cardiomyopathy, secondary ([place]) 09/06/2009   Qualifier: Diagnosis of  By: Arvid Right   Formatting of this note might be different from the original. Overview:  Qualifier: Diagnosis of  By: Arvid Right   Chronic kidney disease, stage III (moderate) ([place]) 05/23/2014   Chronic pain syndrome 01/23/2016   Coronary artery disease    30% lesions noted   Depression    Diabetes type 2, uncontrolled 05/23/2014   Diabetic gastroparesis associated with type 1 diabetes mellitus ([place]) 11/27/2016   DIABETIC PERIPHERAL NEUROPATHY 09/21/2009   Qualifier: Diagnosis of  By: Amil Amen MD, [place]     Essential hypertension 08/30/2009   Qualifier: Diagnosis of  By: Lovette Cliche, CNA, Christy     Gastroesophageal reflux disease 07/30/2009   Formatting of this note might be different from the original. Overview:  Overview:  Qualifier: Diagnosis of  By: Amil Amen MD, Elby Showers: Diagnosis of  By: Chester Holstein NP, Ellwood Handler of this note might be different from the original. Overview:  Qualifier: Diagnosis of  By: Amil Amen MD, Elby Showers: Diagnosis of  By: Chester Holstein NP, Nevin Bloodgood   GERD (gastroesophageal reflux disease)    History of cervical cancer 08/17/2015   Status post partial hysterectomy  Formatting of this note might be different from the original. Status post partial hysterectomy   Hyperlipidemia    INSOMNIA  09/21/2009   Qualifier: Diagnosis of  By: Amil Amen MD, [name]     Lumbar facet arthropathy 05/14/2016   Lumbar spinal stenosis 02/11/2018   Metatarsalgia of both feet 03/11/2017   Migraine 09/13/2012   Overview:  IMPRESSION: possible abd migraine   TOBACCO ABUSE 09/21/2009   Qualifier: Diagnosis of  By: Amil Amen MD, Winona Legato of this note might be different from the original. Overview:  Qualifier: Diagnosis of  By: Amil Amen MD, [name]   Trigeminal neuralgia    ULCER-GASTRIC 09/28/2009   Qualifier: Diagnosis of  By: Chester Holstein NP, Lyn Records, CANDIDAL 12/25/2009   Qualifier: Diagnosis of  By: Amil Amen MD, Tessa Lerner D deficiency 04/25/2013   Past Surgical History:  Procedure Laterality Date   ABDOMINAL HYSTERECTOMY     partial   CHOLECYSTECTOMY     I & D EXTREMITY Left 04/24/2021   Procedure: LEFT LEG DEBRIDEMENT;  Surgeon: [name], MD;  Location: [facility];  Service: Orthopedics;  Laterality: Left;   I & D EXTREMITY Left 05/01/2021   Procedure: LEFT KNEE DEBRIDEMENT;  Surgeon: [name], MD;  Location: [facility];  Service: Orthopedics;  Laterality: Left;   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  Tobacco Use   Smoking status: Former  Types: Cigarettes    Quit date: 51    Years since quitting: 33.9   Smokeless tobacco: Never  Vaping Use   Vaping Use: Never used  Substance and Sexual Activity   Alcohol use: Yes    Comment: occasionally   Drug use: No   Sexual activity: Not on file  Other Topics Concern   Not on file  Social History Narrative   Not on file   Social Determinants of Health   Financial Resource Strain: Not on file  Food Insecurity: Not on file  Transportation Needs: Not on file  Physical  Activity: Not on file  Stress: Not on file  Social Connections: Not on file   Family History  Problem Relation Age of Onset   Diabetes Mother    Diabetes Father    - negative except otherwise stated in the family history section Allergies  Allergen Reactions   Insulin Aspart Other (See Comments) and Swelling    adverse reaction to Novolog    Oxycodone-Acetaminophen Nausea Only   Latex Itching, Swelling and Rash   Morphine Itching and Rash   Prior to Admission medications   Medication Sig Start Date End Date Taking? Authorizing Provider  albuterol (VENTOLIN HFA) 108 (90 Base) MCG/ACT inhaler Inhale 2 puffs into the lungs every 6 (six) hours as needed for wheezing or shortness of breath. Patient taking differently: Inhale 2 puffs into the lungs 3 (three) times daily as needed for wheezing or shortness of breath. 08/03/20  Yes Saguier, Percell Miller, PA-C  amLODipine (NORVASC) 5 MG tablet Take 1 tablet (5 mg total) by mouth daily. 11/19/21 11/14/22 Yes Saguier, Percell Miller, PA-C  aspirin 81 MG tablet Take 81 mg by mouth daily.   Yes [provider]  atorvastatin (LIPITOR) 80 MG tablet Take 1 tablet (80 mg total) by mouth daily. 08/21/21  Yes Saguier, Percell Miller, PA-C  busPIRone (BUSPAR) 15 MG tablet Take 1 tablet (15 mg total) by mouth 2 (two) times daily. Patient taking differently: Take 15-30 mg by mouth as needed (anxiety). 08/21/21  Yes Saguier, Percell Miller, PA-C  fluticasone (FLONASE) 50 MCG/ACT nasal spray Place 2 sprays into both nostrils daily. Patient taking differently: Place 1 spray into both nostrils 2 (two) times daily as needed for allergies. 09/27/19  Yes Saguier, Percell Miller, PA-C  gabapentin (NEURONTIN) 300 MG capsule Take 2 capsules (600 mg total) by mouth 3 (three) times daily. 09/30/21  Yes Saguier, Percell Miller, PA-C  metFORMIN (GLUCOPHAGE-XR) 500 MG 24 hr tablet Take 1 tablet (500 mg total) by mouth daily with breakfast. 11/19/21  Yes Saguier, Percell Miller, PA-C  methocarbamol (ROBAXIN) 750 MG tablet  Take 1 tablet (750 mg total) by mouth every 8 (eight) hours as needed for muscle spasms. Patient taking differently: Take 750 mg by mouth as needed for muscle spasms. 12/30/21  Yes Saguier, Percell Miller, PA-C  nitroGLYCERIN (NITROSTAT) 0.4 MG SL tablet Place 1 tablet (0.4 mg total) under the tongue every 5 (five) minutes as needed for chest pain. 03/21/21  Yes Saguier, Percell Miller, PA-C  oxyCODONE (OXY IR/ROXICODONE) 5 MG immediate release tablet Take 1 tablet (5 mg total) by mouth every 6 (six) hours as needed for severe pain. 1 tab po bid prn severe pain Patient taking differently: Take 5 mg by mouth 2 (two) times daily as needed for severe pain. 12/24/21  Yes [name], MD  promethazine (PHENERGAN) 12.5 MG tablet Take 1 tablet (12.5 mg total) by mouth every 8 (eight) hours as needed for nausea or vomiting. Patient taking differently: Take 12.5  mg by mouth as needed for nausea or vomiting. 11/19/21  Yes Saguier, Percell Miller, PA-C  topiramate (TOPAMAX) 50 MG tablet Take 1 tablet (50 mg total) by mouth 2 (two) times daily. Patient taking differently: Take 50 mg by mouth daily as needed (headache). 04/17/20  Yes Penumalli, Earlean Polka, MD  VITAMIN D PO Take 1 capsule by mouth daily.   Yes [provider]  carvedilol (COREG) 25 MG tablet Take 1 tablet (25 mg total) by mouth 2 (two) times daily with a meal. Patient not taking: Reported on 01/05/2022 07/23/21   Saguier, Percell Miller, PA-C  fluticasone furoate-vilanterol (BREO ELLIPTA) 100-25 MCG/INH AEPB Inhale 1 puff into the lungs daily. Patient not taking: Reported on 01/05/2022 08/03/20   Saguier, Percell Miller, PA-C  insulin glargine-yfgn (SEMGLEE) 100 UNIT/ML Pen Inject 40 Units into the skin at bedtime. Patient not taking: Reported on 01/05/2022 06/14/21   Mackie Pai, PA-C   DG Foot Complete Left  Result Date: 01/05/2022 CLINICAL DATA:  Left foot wound for several months, no known injury, initial encounter EXAM: LEFT FOOT - COMPLETE 3+ VIEW COMPARISON:  11/13/2021  FINDINGS: Soft tissue defect is noted adjacent to the base of the second proximal phalanx consistent with the known plantar wound. This does not extend to the bony structures and no erosive changes to suggest osteomyelitis are noted. Postsurgical changes in the first metatarsal are noted distally stable from prior exams. Chronic deformity of the head of the third metatarsal is seen. Generalized soft tissue swelling is noted in the distal aspect of the foot. IMPRESSION: Soft tissue wound without evidence of osteomyelitis. Electronically Signed   By: Inez Catalina M.D.   On: 01/05/2022 00:03   DG Chest 2 View  Result Date: 01/04/2022 CLINICAL DATA:  Chest pain for several days EXAM: CHEST - 2 VIEW COMPARISON:  04/22/2021 FINDINGS: The heart size and mediastinal contours are within normal limits. Both lungs are clear. The visualized skeletal structures are unremarkable. IMPRESSION: No active cardiopulmonary disease. Electronically Signed   By: Inez Catalina M.D.   On: 01/04/2022 20:22   - pertinent xrays, CT, MRI studies were reviewed and independently interpreted  Positive ROS: All other systems have been reviewed and were otherwise negative with the exception of those mentioned in the HPI and as above.  Physical Exam: General: Alert, no acute distress Psychiatric: Patient is competent for consent with normal mood and affect Lymphatic: No axillary or cervical lymphadenopathy Cardiovascular: No pedal edema Respiratory: No cyanosis, no use of accessory musculature GI: No organomegaly, abdomen is soft and non-tender    Images:  '@ENCIMAGES'$ @  Labs:  Lab Results  Component Value Date   HGBA1C 9.8 (H) 08/26/2021   HGBA1C 12.3 (H) 04/23/2021   HGBA1C 7.9 (H) 08/24/2020   ESRSEDRATE 35 (H) 01/05/2022   ESRSEDRATE 41 (H) 08/24/2020   REPTSTATUS 05/23/2021 FINAL 05/21/2021   GRAMSTAIN  05/01/2021    FEW WBC PRESENT,BOTH PMN AND MONONUCLEAR NO ORGANISMS SEEN    CULT >=100,000 COLONIES/mL  SERRATIA MARCESCENS (A) 05/21/2021   LABORGA SERRATIA MARCESCENS (A) 05/21/2021    Lab Results  Component Value Date   ALBUMIN 4.0 08/26/2021   ALBUMIN 3.9 05/28/2021   ALBUMIN 3.1 (L) 05/22/2021   PREALBUMIN 23 01/05/2022        Latest Ref Rng & Units 01/04/2022    7:33 PM 08/26/2021    3:04 PM 06/14/2021   11:39 AM  CBC EXTENDED  WBC 4.0 - 10.5 K/uL 6.3  7.3  4.6   RBC 3.87 -  5.11 MIL/uL 5.02  4.21  4.33   Hemoglobin 12.0 - 15.0 g/dL 14.6  12.3  12.9   HCT 36.0 - 46.0 % 45.4  37.1  40.3   Platelets 150 - 400 K/uL 360  260.0  327   NEUT# 1.4 - 7.7 K/uL  3.3  1,504   Lymph# 0.7 - 4.0 K/uL  2.6  2,245     Neurologic: Patient does not have protective sensation bilateral lower extremities.   MUSCULOSKELETAL:   Skin: Examination patient has swelling cellulitis and a plantar ulcer beneath the second MTP joint.  Patient has a strong dorsalis pedis pulse.  There is no ascending cellulitis.  Review of the MRI scan shows edema at the base of the proximal phalanx second toe as well as edema around the MTP joint.  Patient's white cell count is 6.3 hemoglobin A1c 9.8 and before that was 12.3.    Assessment: Assessment: Septic MTP joint left foot second toe with osteomyelitis base of the proximal phalanx second toe.  Plan: Discussed with the patient the least invasive procedure would be to proceed with a left foot second ray amputation.  Risks and benefits were discussed including need for additional surgery potential need for transmetatarsal amputation.  Discussed the importance of strict nonweightbearing to ensure wound healing.  Patient states she understands wishes to proceed at this time.  Will try to get her on for surgery Monday.  Thank you for the consult and the opportunity to see [name], [place] [phone] 1:20 PM

## 2022-01-06 ENCOUNTER — Inpatient Hospital Stay (HOSPITAL_COMMUNITY): Payer: Commercial Managed Care - HMO | Admitting: Anesthesiology

## 2022-01-06 ENCOUNTER — Encounter (HOSPITAL_COMMUNITY): Payer: Commercial Managed Care - HMO

## 2022-01-06 ENCOUNTER — Other Ambulatory Visit: Payer: Self-pay

## 2022-01-06 ENCOUNTER — Encounter (HOSPITAL_COMMUNITY): Payer: Self-pay | Admitting: Internal Medicine

## 2022-01-06 ENCOUNTER — Encounter (HOSPITAL_COMMUNITY)
Admission: EM | Disposition: A | Discharge status: Home / Self Care | Payer: Self-pay | Source: Home / Self Care | Attending: Internal Medicine

## 2022-01-06 DIAGNOSIS — M86172 Other acute osteomyelitis, left ankle and foot: Secondary | ICD-10-CM | POA: Diagnosis not present

## 2022-01-06 DIAGNOSIS — M869 Osteomyelitis, unspecified: Secondary | ICD-10-CM

## 2022-01-06 DIAGNOSIS — N183 Chronic kidney disease, stage 3 unspecified: Secondary | ICD-10-CM

## 2022-01-06 DIAGNOSIS — I2510 Atherosclerotic heart disease of native coronary artery without angina pectoris: Secondary | ICD-10-CM

## 2022-01-06 DIAGNOSIS — Z7984 Long term (current) use of oral hypoglycemic drugs: Secondary | ICD-10-CM

## 2022-01-06 DIAGNOSIS — E1169 Type 2 diabetes mellitus with other specified complication: Secondary | ICD-10-CM

## 2022-01-06 DIAGNOSIS — E1122 Type 2 diabetes mellitus with diabetic chronic kidney disease: Secondary | ICD-10-CM

## 2022-01-06 DIAGNOSIS — I130 Hypertensive heart and chronic kidney disease with heart failure and stage 1 through stage 4 chronic kidney disease, or unspecified chronic kidney disease: Secondary | ICD-10-CM

## 2022-01-06 DIAGNOSIS — Z87891 Personal history of nicotine dependence: Secondary | ICD-10-CM

## 2022-01-06 DIAGNOSIS — I509 Heart failure, unspecified: Secondary | ICD-10-CM

## 2022-01-06 HISTORY — PX: AMPUTATION: SHX166

## 2022-01-06 LAB — CBC
HCT: 32.4 % — ABNORMAL LOW (ref 36.0–46.0)
Hemoglobin: 11.2 g/dL — ABNORMAL LOW (ref 12.0–15.0)
MCH: 30.4 pg (ref 26.0–34.0)
MCHC: 34.6 g/dL (ref 30.0–36.0)
MCV: 88 fL (ref 80.0–100.0)
Platelets: 307 10*3/uL (ref 150–400)
RBC: 3.68 MIL/uL — ABNORMAL LOW (ref 3.87–5.11)
RDW: 14.7 % (ref 11.5–15.5)
WBC: 7.9 10*3/uL (ref 4.0–10.5)
nRBC: 0 % (ref 0.0–0.2)

## 2022-01-06 LAB — BASIC METABOLIC PANEL
Anion gap: 10 (ref 5–15)
BUN: 21 mg/dL — ABNORMAL HIGH (ref 6–20)
CO2: 23 mmol/L (ref 22–32)
Calcium: 8.6 mg/dL — ABNORMAL LOW (ref 8.9–10.3)
Chloride: 103 mmol/L (ref 98–111)
Creatinine, Ser: 0.98 mg/dL (ref 0.44–1.00)
GFR, Estimated: >60 mL/min (ref >60)
Glucose, Bld: 248 mg/dL — ABNORMAL HIGH (ref 70–99)
Potassium: 4.7 mmol/L (ref 3.5–5.1)
Sodium: 136 mmol/L (ref 135–145)

## 2022-01-06 LAB — GLUCOSE, CAPILLARY
Glucose-Capillary: 175 mg/dL — ABNORMAL HIGH (ref 70–99)
Glucose-Capillary: 123 mg/dL — ABNORMAL HIGH (ref 70–99)
Glucose-Capillary: 126 mg/dL — ABNORMAL HIGH (ref 70–99)
Glucose-Capillary: 148 mg/dL — ABNORMAL HIGH (ref 70–99)

## 2022-01-06 SURGERY — AMPUTATION DIGIT
Anesthesia: Monitor Anesthesia Care | Laterality: Left

## 2022-01-06 MED ORDER — ADULT MULTIVITAMIN W/MINERALS CH
1.0000 | ORAL_TABLET | Freq: Every day | ORAL | Status: DC
  Administered 2022-01-07 – 2022-01-08 (×2): 1 via ORAL
  Filled 2022-01-06 (×2): qty 1

## 2022-01-06 MED ORDER — MIDAZOLAM HCL 5 MG/5ML IJ SOLN
INTRAMUSCULAR | Status: DC | PRN
  Administered 2022-01-06: 2 mg via INTRAVENOUS

## 2022-01-06 MED ORDER — ONDANSETRON HCL 4 MG/2ML IJ SOLN: 4.0000 mg | Freq: Four times a day (QID) | INTRAMUSCULAR | Status: DC | PRN

## 2022-01-06 MED ORDER — HYDROMORPHONE HCL 1 MG/ML IJ SOLN
0.5000 mg | INTRAMUSCULAR | Status: DC | PRN
  Administered 2022-01-06 – 2022-01-07 (×5): 0.5 mg via INTRAVENOUS
  Filled 2022-01-06 (×5): qty 0.5

## 2022-01-06 MED ORDER — FENTANYL CITRATE (PF) 250 MCG/5ML IJ SOLN
INTRAMUSCULAR | Status: AC
  Filled 2022-01-06: qty 5

## 2022-01-06 MED ORDER — ACETAMINOPHEN 325 MG PO TABS: 325.0000 mg | ORAL_TABLET | Freq: Four times a day (QID) | ORAL | Status: DC | PRN

## 2022-01-06 MED ORDER — METOPROLOL TARTRATE 5 MG/5ML IV SOLN: 2.0000 mg | INTRAVENOUS | Status: DC | PRN

## 2022-01-06 MED ORDER — LIDOCAINE HCL (PF) 1 % IJ SOLN
INTRAMUSCULAR | Status: DC | PRN
  Administered 2022-01-06: 10 mL
  Administered 2022-01-06: 7 mL

## 2022-01-06 MED ORDER — OXYCODONE HCL 5 MG PO TABS
10.0000 mg | ORAL_TABLET | ORAL | Status: DC | PRN
  Administered 2022-01-06: 10 mg via ORAL
  Administered 2022-01-07: 15 mg via ORAL
  Filled 2022-01-06: qty 3

## 2022-01-06 MED ORDER — CHLORHEXIDINE GLUCONATE 0.12 % MT SOLN
15.0000 mL | Freq: Once | OROMUCOSAL | Status: AC
  Administered 2022-01-06: 15 mL via OROMUCOSAL

## 2022-01-06 MED ORDER — HYDRALAZINE HCL 20 MG/ML IJ SOLN: 5.0000 mg | INTRAMUSCULAR | Status: DC | PRN

## 2022-01-06 MED ORDER — POTASSIUM CHLORIDE CRYS ER 20 MEQ PO TBCR: 20.0000 meq | EXTENDED_RELEASE_TABLET | Freq: Every day | ORAL | Status: DC | PRN

## 2022-01-06 MED ORDER — INSULIN REGULAR HUMAN 100 UNIT/ML IJ SOLN
0.0000 [IU] | Freq: Every day | INTRAMUSCULAR | Status: DC
  Administered 2022-01-07: 5 [IU] via SUBCUTANEOUS

## 2022-01-06 MED ORDER — ALUM & MAG HYDROXIDE-SIMETH 200-200-20 MG/5ML PO SUSP: 15.0000 mL | ORAL | Status: DC | PRN

## 2022-01-06 MED ORDER — INSULIN REGULAR HUMAN 100 UNIT/ML IJ SOLN
0.0000 [IU] | Freq: Three times a day (TID) | INTRAMUSCULAR | Status: DC
  Administered 2022-01-07: 15 [IU] via SUBCUTANEOUS
  Filled 2022-01-06: qty 3

## 2022-01-06 MED ORDER — OXYCODONE HCL 5 MG PO TABS
5.0000 mg | ORAL_TABLET | ORAL | Status: DC | PRN
  Filled 2022-01-06: qty 2

## 2022-01-06 MED ORDER — LACTATED RINGERS IV SOLN: INTRAVENOUS | Status: DC

## 2022-01-06 MED ORDER — PHENOL 1.4 % MT LIQD: 1.0000 | OROMUCOSAL | Status: DC | PRN

## 2022-01-06 MED ORDER — DOCUSATE SODIUM 100 MG PO CAPS: 100.0000 mg | ORAL_CAPSULE | Freq: Every day | ORAL | Status: DC

## 2022-01-06 MED ORDER — LACTATED RINGERS IV SOLN: INTRAVENOUS | Status: DC | PRN

## 2022-01-06 MED ORDER — 0.9 % SODIUM CHLORIDE (POUR BTL) OPTIME
TOPICAL | Status: DC | PRN
  Administered 2022-01-06: 1000 mL

## 2022-01-06 MED ORDER — JUVEN PO PACK
1.0000 | PACK | Freq: Two times a day (BID) | ORAL | Status: DC
  Administered 2022-01-07 – 2022-01-08 (×3): 1 via ORAL
  Filled 2022-01-06 (×3): qty 1

## 2022-01-06 MED ORDER — BISACODYL 5 MG PO TBEC: 5.0000 mg | DELAYED_RELEASE_TABLET | Freq: Every day | ORAL | Status: DC | PRN

## 2022-01-06 MED ORDER — ORAL CARE MOUTH RINSE: 15.0000 mL | Freq: Once | OROMUCOSAL | Status: AC

## 2022-01-06 MED ORDER — MAGNESIUM SULFATE 2 GM/50ML IV SOLN: 2.0000 g | Freq: Every day | INTRAVENOUS | Status: DC | PRN

## 2022-01-06 MED ORDER — LABETALOL HCL 5 MG/ML IV SOLN: 10.0000 mg | INTRAVENOUS | Status: DC | PRN

## 2022-01-06 MED ORDER — ACETAMINOPHEN 10 MG/ML IV SOLN: 1000.0000 mg | Freq: Once | INTRAVENOUS | Status: DC | PRN

## 2022-01-06 MED ORDER — VITAMIN C 500 MG PO TABS
1000.0000 mg | ORAL_TABLET | Freq: Every day | ORAL | Status: DC
  Administered 2022-01-07 – 2022-01-08 (×2): 1000 mg via ORAL
  Filled 2022-01-06 (×2): qty 2

## 2022-01-06 MED ORDER — PANTOPRAZOLE SODIUM 40 MG PO TBEC
40.0000 mg | DELAYED_RELEASE_TABLET | Freq: Every day | ORAL | Status: DC
  Administered 2022-01-07 – 2022-01-08 (×2): 40 mg via ORAL
  Filled 2022-01-06 (×2): qty 1

## 2022-01-06 MED ORDER — GUAIFENESIN-DM 100-10 MG/5ML PO SYRP: 15.0000 mL | ORAL_SOLUTION | ORAL | Status: DC | PRN

## 2022-01-06 MED ORDER — MAGNESIUM CITRATE PO SOLN: 1.0000 | Freq: Once | ORAL | Status: DC | PRN

## 2022-01-06 MED ORDER — SODIUM CHLORIDE 0.9 % IV SOLN: INTRAVENOUS | Status: DC

## 2022-01-06 MED ORDER — POLYETHYLENE GLYCOL 3350 17 G PO PACK: 17.0000 g | PACK | Freq: Every day | ORAL | Status: DC | PRN

## 2022-01-06 MED ORDER — HYDROMORPHONE HCL 1 MG/ML IJ SOLN
0.5000 mg | Freq: Once | INTRAMUSCULAR | Status: AC | PRN
  Administered 2022-01-06: 0.5 mg via INTRAVENOUS
  Filled 2022-01-06: qty 0.5

## 2022-01-06 MED ORDER — INSULIN REGULAR HUMAN 100 UNIT/ML IJ SOLN
0.0000 [IU] | Freq: Three times a day (TID) | INTRAMUSCULAR | Status: DC
  Administered 2022-01-06: 2 [IU] via SUBCUTANEOUS

## 2022-01-06 MED ORDER — PROPOFOL 10 MG/ML IV BOLUS
INTRAVENOUS | Status: DC | PRN
  Administered 2022-01-06: 20 mg via INTRAVENOUS
  Administered 2022-01-06 (×2): 10 mg via INTRAVENOUS

## 2022-01-06 MED ORDER — HYDROMORPHONE HCL 1 MG/ML IJ SOLN
0.5000 mg | INTRAMUSCULAR | Status: AC | PRN
  Administered 2022-01-06: 0.5 mg via INTRAVENOUS
  Filled 2022-01-06: qty 0.5

## 2022-01-06 MED ORDER — LIDOCAINE HCL (PF) 1 % IJ SOLN
INTRAMUSCULAR | Status: AC
  Filled 2022-01-06: qty 30

## 2022-01-06 MED ORDER — ZINC SULFATE 220 (50 ZN) MG PO CAPS
220.0000 mg | ORAL_CAPSULE | Freq: Every day | ORAL | Status: DC
  Administered 2022-01-07 – 2022-01-08 (×2): 220 mg via ORAL
  Filled 2022-01-06 (×2): qty 1

## 2022-01-06 MED ORDER — FENTANYL CITRATE (PF) 250 MCG/5ML IJ SOLN
INTRAMUSCULAR | Status: DC | PRN
  Administered 2022-01-06 (×2): 50 ug via INTRAVENOUS

## 2022-01-06 MED ORDER — MIDAZOLAM HCL 2 MG/2ML IJ SOLN
INTRAMUSCULAR | Status: AC
  Filled 2022-01-06: qty 2

## 2022-01-06 MED ORDER — FENTANYL CITRATE (PF) 100 MCG/2ML IJ SOLN: 25.0000 ug | INTRAMUSCULAR | Status: DC | PRN

## 2022-01-06 MED ORDER — HYDROMORPHONE HCL 1 MG/ML IJ SOLN
0.5000 mg | INTRAMUSCULAR | Status: DC | PRN
  Administered 2022-01-07 – 2022-01-08 (×6): 1 mg via INTRAVENOUS
  Filled 2022-01-06 (×6): qty 1

## 2022-01-06 SURGICAL SUPPLY — 28 items
BAG COUNTER SPONGE SURGICOUNT (BAG) ×1 IMPLANT
BLADE SURG 21 STRL SS (BLADE) ×1 IMPLANT
BNDG COHESIVE 4X5 TAN STRL (GAUZE/BANDAGES/DRESSINGS) ×1 IMPLANT
BNDG COHESIVE 6X5 TAN NS LF (GAUZE/BANDAGES/DRESSINGS) IMPLANT
BNDG GAUZE DERMACEA FLUFF 4 (GAUZE/BANDAGES/DRESSINGS) ×1 IMPLANT
COVER SURGICAL LIGHT HANDLE (MISCELLANEOUS) ×2 IMPLANT
DRAPE U-SHAPE 47X51 STRL (DRAPES) ×1 IMPLANT
DRSG ADAPTIC 3X8 NADH LF (GAUZE/BANDAGES/DRESSINGS) IMPLANT
DRSG TUBE GAUZE 1X5YD SZ2 (GAUZE/BANDAGES/DRESSINGS) IMPLANT
DURAPREP 26ML APPLICATOR (WOUND CARE) ×1 IMPLANT
ELECT REM PT RETURN 9FT ADLT (ELECTROSURGICAL) ×1
ELECTRODE REM PT RTRN 9FT ADLT (ELECTROSURGICAL) ×1 IMPLANT
GAUZE PAD ABD 8X10 STRL (GAUZE/BANDAGES/DRESSINGS) ×1 IMPLANT
GAUZE SPONGE 4X4 12PLY STRL (GAUZE/BANDAGES/DRESSINGS) IMPLANT
GLOVE BIOGEL PI IND STRL 9 (GLOVE) ×1 IMPLANT
GLOVE SURG ORTHO 9.0 STRL STRW (GLOVE) ×1 IMPLANT
GOWN STRL REUS W/ TWL XL LVL3 (GOWN DISPOSABLE) ×2 IMPLANT
GOWN STRL REUS W/TWL XL LVL3 (GOWN DISPOSABLE) ×2
GRAFT SKIN WND MICRO 38 (Tissue) IMPLANT
KIT BASIN OR (CUSTOM PROCEDURE TRAY) ×1 IMPLANT
KIT TURNOVER KIT B (KITS) ×1 IMPLANT
MANIFOLD NEPTUNE II (INSTRUMENTS) ×1 IMPLANT
NS IRRIG 1000ML POUR BTL (IV SOLUTION) ×1 IMPLANT
PACK ORTHO EXTREMITY (CUSTOM PROCEDURE TRAY) ×1 IMPLANT
PAD ABD 8X10 STRL (GAUZE/BANDAGES/DRESSINGS) IMPLANT
PAD ARMBOARD 7.5X6 YLW CONV (MISCELLANEOUS) ×2 IMPLANT
SUT ETHILON 2 0 PSLX (SUTURE) ×1 IMPLANT
TOWEL GREEN STERILE (TOWEL DISPOSABLE) ×1 IMPLANT

## 2022-01-06 NOTE — Progress Notes (Signed)
Initial Nutrition Assessment  DOCUMENTATION CODES:   Not applicable  INTERVENTION:   Diet education Multivitamin w/ minerals daily Monitor need for oral supplement post-op.   NUTRITION DIAGNOSIS:   Increased nutrient needs related to wound healing as evidenced by estimated needs.  GOAL:   Patient will meet greater than or equal to 90% of their needs  MONITOR:   PO intake, Supplement acceptance, Weight trends, Skin  REASON FOR ASSESSMENT:   Consult Wound healing  ASSESSMENT:   56 y.o. female presented to the ED with an unhealing foot wound. PMH includes T2DM, neuropathy, HTN, HLD, gastroparesis, GERD, CKD III, CHF, and cervical cancer. Pt admitted with osteomyelitis secondary to  diabetic foot ulcer.   Pt laying in bed. Reports that her appetite has been off and on, but she is a stress eater. Reports that she maybe eats 2 meals per day, reports that she would take a few bites and be done. Denies any recent weight loss, thinks she may have gained some due to her stress eating.   Reviewed the Plate Method with pt and discussed controlling her glucose levels to aid in wound healing. Reviewed having protein with each meals and snacks to assist in carbohydrate digestion. Reviewed types of protein with pt.  Pt agreeable to trying an oral supplement after surgery to aid in getting adequate calories and protein for wound healing.   Medications reviewed and include: Colace, NovoLog SSI, Semglee, IV antibiotics  Labs reviewed: Hgb A1c 9.8% (08/26/21), 24 hr CBGs 147-210  NUTRITION - FOCUSED PHYSICAL EXAM:  Deferred to follow-up.  Diet Order:   Diet Order             Diet NPO time specified  Diet effective midnight                   EDUCATION NEEDS:   Education needs have been addressed  Skin:  Skin Assessment: Skin Integrity Issues: Skin Integrity Issues:: Diabetic Ulcer Diabetic Ulcer: L foot  Last BM:  11/26  Height:   Ht Readings from Last 1 Encounters:   01/04/22 6' (1.829 m)    Weight:   Wt Readings from Last 1 Encounters:  01/04/22 86.2 kg    Ideal Body Weight:  72.7 kg  BMI:  Body mass index is 25.77 kg/m.  Estimated Nutritional Needs:   Kcal:  2100-2300  Protein:  105-120 grams  Fluid:  >/= 2 L    [name] RD, LDN Clinical Dietitian See [facility] for contact information.

## 2022-01-06 NOTE — Inpatient Diabetes Management (Addendum)
Inpatient Diabetes Program Recommendations  AACE/ADA: New Consensus Statement on Inpatient Glycemic Control (2015)  Target Ranges:  Prepandial:   less than 140 mg/dL      Peak postprandial:   less than 180 mg/dL (1-2 hours)      Critically ill patients:  140 - 180 mg/dL   Lab Results  Component Value Date   GLUCAP 123 (H) 01/06/2022   HGBA1C 9.8 (H) 08/26/2021    Review of Glycemic Control  Latest Reference Range & Units 01/05/22 19:52 01/06/22 08:16 01/06/22 11:45  Glucose-Capillary 70 - 99 mg/dL 147 (H) 175 (H) 123 (H)   Diabetes history: DM 2 Outpatient Diabetes medications: Metformin 500 mg Daily (lantus 40 units qhs in the past) Current orders for Inpatient glycemic control:  Semglee 20 units qhs Novolog 0-15 units tid + hs  A1c 9.8% on 08/26/2021  Inpatient Diabetes Program Recommendations:    -  obtain a more recent A1c as the last one was a result from 08/2021.  Watch glucose trends for now on current regimen. Will see pt.   Spoke with pt at bedside regarding A1c of 9.8% on 7/17 and glucose control at home. Pt reports better glucose trends since the reading in July, however pt has been fighting the infection of her foot for awhile with 2 rounds of antibiotics. Pt reports she lives by herself and at times it is difficult for her to work and take care of her financial situation by herself when she is unable to work due to being sick. Pt at times did not take her insulin when feeling ill. Pt wanting information about applying for disability. Placed TOC consult to assist in this. I discussed sick day guidelines with pt and to still take her basal insulin when feeling sick. I also covered checking glucose trends more frequently. I explained that trends would increase most of the time instead of decrease due to illness. Pt has all needed supplies at d/c.  Thanks,  Tama Headings RN, MSN, BC-ADM Inpatient Diabetes Coordinator Team Pager [phone] (8a-5p)

## 2022-01-06 NOTE — Progress Notes (Addendum)
Patient arrived in short stay. CBG 157 - per pre-op protocol patient will need 2 units Novolog. Patient allergic to Novolog. MD notified. Pharmacy was called.

## 2022-01-06 NOTE — Discharge Instructions (Signed)

## 2022-01-06 NOTE — Anesthesia Preprocedure Evaluation (Addendum)
Anesthesia Evaluation  Patient identified by MRN, date of birth, ID band Patient awake    Reviewed: Allergy & Precautions, NPO status , Patient's Chart, lab work & pertinent test results  Airway Mallampati: II  TM Distance: >3 FB Neck ROM: Full    Dental no notable dental hx.    Pulmonary former smoker   Pulmonary exam normal        Cardiovascular hypertension, + CAD and +CHF  + Valvular Problems/Murmurs  Rhythm:Regular Rate:Normal  TTE 04/23/2021: IMPRESSIONS     1. Left ventricular ejection fraction, by estimation, is 60 to 65%. The  left ventricle has normal function. The left ventricle has no regional  wall motion abnormalities. Left ventricular diastolic parameters were  normal.   2. Right ventricular systolic function is normal. The right ventricular  size is normal.   3. The mitral valve is normal in structure. No evidence of mitral valve  regurgitation. No evidence of mitral stenosis.   4. The aortic valve is normal in structure. Aortic valve regurgitation is  not visualized. No aortic stenosis is present.   5. The inferior vena cava is normal in size with greater than 50%  respiratory variability, suggesting right atrial pressure of 3 mmHg.     Neuro/Psych  Headaches PSYCHIATRIC DISORDERS Anxiety Depression    Chronic pain syndrome  Neuromuscular disease (lumbar spinal stenosis, trigeminal neuralgia, diabetic peripheral neuropathy)    GI/Hepatic PUD,GERD  ,,Diabetic gastroparesis   Endo/Other  diabetes, Type 2    Renal/GU CRFRenal disease     Musculoskeletal   Abdominal Normal abdominal exam  (+)   Peds  Hematology negative hematology ROS (+)   Anesthesia Other Findings 56 y.o. female presented to the ED with an unhealing foot wound. PMH includes T2DM, neuropathy, HTN, HLD, gastroparesis, GERD, CKD III, CHF, and cervical cancer. Pt admitted with osteomyelitis secondary to  diabetic foot ulcer.    Reproductive/Obstetrics                             Anesthesia Physical Anesthesia Plan  ASA: 3  Anesthesia Plan: MAC   Post-op Pain Management:    Induction: Intravenous  PONV Risk Score and Plan: 2 and Ondansetron, Dexamethasone, Midazolam, Treatment may vary due to age or medical condition and Propofol infusion  Airway Management Planned: Simple Face Mask, Natural Airway and Nasal Cannula  Additional Equipment: None  Intra-op Plan:   Post-operative Plan:   Informed Consent: I have reviewed the patients History and Physical, chart, labs and discussed the procedure including the risks, benefits and alternatives for the proposed anesthesia with the patient or authorized representative who has indicated his/her understanding and acceptance.     Dental advisory given  Plan Discussed with: CRNA  Anesthesia Plan Comments: (Lab Results      Component                Value               Date                      WBC                      7.9                 01/06/2022                HGB  11.2 (L)            01/06/2022                HCT                      32.4 (L)            01/06/2022                MCV                      88.0                01/06/2022                PLT                      307                 01/06/2022            Lab Results      Component                Value               Date                      NA                       136                 01/06/2022                K                        4.7                 01/06/2022                CO2                      23                  01/06/2022                GLUCOSE                  248 (H)             01/06/2022                BUN                      21 (H)              01/06/2022                CREATININE               0.98                01/06/2022                CALCIUM                  8.6 (L)             01/06/2022  GFRNONAA                  >60                 01/06/2022           )       Anesthesia Quick Evaluation

## 2022-01-06 NOTE — Consult Note (Signed)
WOC Nurse Consult Note: Patient receiving care in [facility] 6N4. Reason for Consult: LE wound; wound to plantar left foot, see photo. Dr. Sharol Given is aware and I see surgery planned for today by him to this area. I am signing off the [facility] consult. Please direct all questions related to the care of this wound to Dr. Sharol Given. [place] nurse will not follow at this time.  Please re-consult the [place] team if needed.  Val Riles, RN, MSN, CWOCN, CNS-BC, pager [phone]

## 2022-01-06 NOTE — Progress Notes (Signed)
PROGRESS NOTE  [name] DGL:[id number] DOB: [birth year] DOA: 01/04/2022 PCP: Mackie Pai, PA-C   LOS: 1 day   Brief Narrative / Interim history: 56 year old female with DM with neuropathy, gastroparesis, CAD, chronic pain, HTN, HLD comes into the hospital with a nonhealing foot wound.  She has been having increased sharp pain in the foot, worsening smell over the last few days.  She has been followed regularly by Dr. Sharol Given, and completed few rounds of antibiotics.  MRI on 11/14 with concern for osteomyelitis.  Subjective / 24h Interval events: Complains of significant pain.  Assesement and Plan: Principal Problem:   Osteomyelitis of second toe of left foot (HCC) Active Problems:   Uncontrolled type 2 diabetes mellitus with hyperglycemia, without long-term current use of insulin (HCC)   Chronic pain syndrome   Coronary artery disease   Hyperlipidemia   Hypertension   Mood disorder ([place])   Principal problem Diabetic foot ulcer, osteomyelitis-Prior foot MRI on 11/14 with concern for abscess and likely osteo at the 2nd MTP.  She apparently failed Augmentin and Bactrim recently.  Orthopedic surgery consulted, she will be taken to the OR today for second ray amputation.  She was placed on broad-spectrum antibiotics meanwhile.  ID will be consulted, she is followed by Dr. Gale Journey as an outpatient  Active problems Essential hypertension-continue amlodipine, reports that she is not taking Coreg  Hyperlipidemia-continue statin  Coronary artery disease-continue aspirin, reported some chest pain in the setting of anxiety, high-sensitivity troponin negative x 2, 5 >> 4.  Stress test 2021 unremarkable per cardiology  Mood disorder-continue BuSpar  DM2, poorly controlled, with hyperglycemia-last A1c is 9.8.  Has been placed on glargine along with sliding scale.  Monitor CBGs  CBG (last 3)  Recent Labs    01/05/22 1736 01/05/22 1952 01/06/22 0816  GLUCAP 310* 147* 175*    Scheduled Meds:  amLODipine  5 mg Oral Daily   aspirin EC  81 mg Oral Daily   atorvastatin  80 mg Oral Daily   chlorhexidine  60 mL Topical Once   docusate sodium  100 mg Oral BID   gabapentin  600 mg Oral TID   insulin aspart  0-15 Units Subcutaneous TID WC   insulin aspart  0-5 Units Subcutaneous QHS   insulin glargine-yfgn  20 Units Subcutaneous QHS   povidone-iodine  2 Application Topical Once   sodium chloride flush  3 mL Intravenous Q12H   Continuous Infusions:  sodium chloride 75 mL/hr at 01/06/22 0450    ceFAZolin (ANCEF) IV     cefTRIAXone (ROCEPHIN)  IV Stopped (01/05/22 1406)   metronidazole 500 mg (01/06/22 0451)   vancomycin     PRN Meds:.acetaminophen **OR** acetaminophen, albuterol, bisacodyl, busPIRone, hydrALAZINE, HYDROmorphone (DILAUDID) injection, methocarbamol, ondansetron **OR** ondansetron (ZOFRAN) IV, polyethylene glycol, promethazine  Current Outpatient Medications  Medication Instructions   albuterol (VENTOLIN HFA) 108 (90 Base) MCG/ACT inhaler 2 puffs, Inhalation, Every 6 hours PRN   amLODipine (NORVASC) 5 mg, Oral, Daily   aspirin 81 mg, Oral, Daily   atorvastatin (LIPITOR) 80 mg, Oral, Daily   busPIRone (BUSPAR) 15 mg, Oral, 2 times daily   carvedilol (COREG) 25 mg, Oral, 2 times daily with meals   fluticasone (FLONASE) 50 MCG/ACT nasal spray 2 sprays, Each Nare, Daily   fluticasone furoate-vilanterol (BREO ELLIPTA) 100-25 MCG/INH AEPB 1 puff, Inhalation, Daily   gabapentin (NEURONTIN) 600 mg, Oral, 3 times daily   insulin glargine-yfgn (SEMGLEE) 40 Units, Subcutaneous, Daily at bedtime   metFORMIN (GLUCOPHAGE-XR) 500 mg,  Oral, Daily with breakfast   methocarbamol (ROBAXIN) 750 mg, Oral, Every 8 hours PRN   nitroGLYCERIN (NITROSTAT) 0.4 mg, Sublingual, Every 5 min PRN   oxyCODONE (OXY IR/ROXICODONE) 5 mg, Oral, Every 6 hours PRN, 1 tab po bid prn severe pain   promethazine (PHENERGAN) 12.5 mg, Oral, Every 8 hours PRN   topiramate (TOPAMAX) 50  mg, Oral, 2 times daily   VITAMIN D PO 1 capsule, Oral, Daily    Diet Orders (From admission, onward)     Start     Ordered   01/06/22 0001  Diet NPO time specified  Diet effective midnight        01/05/22 1456            DVT prophylaxis: SCDs Start: 01/05/22 1001   Lab Results  Component Value Date   PLT 307 01/06/2022      Code Status: Full Code  Family Communication: no family at bedside   Status is: Inpatient  Remains inpatient appropriate because: OR today  Level of care: Telemetry Surgical  Consultants:  Orthopedic surgery  ID  Objective: Vitals:   01/05/22 0730 01/05/22 1738 01/05/22 1950 01/06/22 0843  BP: 131/79 (!) 123/94 128/83 129/83  Pulse: 86 (!) 101 90 88  Resp: (!) '22 17 18 18  '$ Temp:  (!) 97.3 F (36.3 C) 97.9 F (36.6 C) 98.5 F (36.9 C)  TempSrc:  Oral Oral Oral  SpO2: 98% 98% 100% 100%  Weight:      Height:        Intake/Output Summary (Last 24 hours) at 01/06/2022 1011 Last data filed at 01/06/2022 0759 Gross per 24 hour  Intake 1900.46 ml  Output --  Net 1900.46 ml   Wt Readings from Last 3 Encounters:  01/04/22 86.2 kg  08/21/21 81.6 kg  07/26/21 78.9 kg    Examination:  Constitutional: NAD Eyes: no scleral icterus ENMT: Mucous membranes are moist.  Neck: normal, supple Respiratory: clear to auscultation bilaterally, no wheezing, no crackles. Normal respiratory effort. No accessory muscle use.  Cardiovascular: Regular rate and rhythm, no murmurs / rubs / gallops. No LE edema.  Abdomen: non distended, no tenderness. Bowel sounds positive.  Musculoskeletal: no clubbing / cyanosis.   Data Reviewed: I have independently reviewed following labs and imaging studies   CBC Recent Labs  Lab 01/04/22 1933 01/06/22 0300  WBC 6.3 7.9  HGB 14.6 11.2*  HCT 45.4 32.4*  PLT 360 307  MCV 90.4 88.0  MCH 29.1 30.4  MCHC 32.2 34.6  RDW 15.3 14.7    Recent Labs  Lab 01/04/22 1933 01/05/22 1247 01/06/22 0300  NA 136   --  136  K 3.8  --  4.7  CL 104  --  103  CO2 24  --  23  GLUCOSE 209*  --  248*  BUN 22*  --  21*  CREATININE 1.39*  --  0.98  CALCIUM 9.1  --  8.6*  CRP  --  0.6  --     ------------------------------------------------------------------------------------------------------------------ No results for input(s): "CHOL", "HDL", "LDLCALC", "TRIG", "CHOLHDL", "LDLDIRECT" in the last 72 hours.  Lab Results  Component Value Date   HGBA1C 9.8 (H) 08/26/2021   ------------------------------------------------------------------------------------------------------------------ No results for input(s): "TSH", "T4TOTAL", "T3FREE", "THYROIDAB" in the last 72 hours.  Invalid input(s): "FREET3"  Cardiac Enzymes No results for input(s): "CKMB", "TROPONINI", "MYOGLOBIN" in the last 168 hours.  Invalid input(s): "CK" ------------------------------------------------------------------------------------------------------------------ No results found for: "BNP"  CBG: Recent Labs  Lab 01/05/22 0851 01/05/22  1218 01/05/22 1736 01/05/22 1952 01/06/22 0816  GLUCAP 210* 247* 310* 147* 175*    Recent Results (from the past 240 hour(s))  Culture, blood (Routine X 2) w Reflex to ID Panel     Status: None (Preliminary result)   Collection Time: 01/05/22 12:47 PM   Specimen: BLOOD  Result Value Ref Range Status   Specimen Description BLOOD SITE NOT SPECIFIED  Final   Special Requests IN PEDIATRIC BOTTLE Blood Culture adequate volume  Final   Culture   Final    NO GROWTH < 24 HOURS Performed at [facility], [place] [address], [place], [place] [address]    Report Status PENDING  Incomplete  Culture, blood (Routine X 2) w Reflex to ID Panel     Status: None (Preliminary result)   Collection Time: 01/05/22  1:08 PM   Specimen: BLOOD  Result Value Ref Range Status   Specimen Description BLOOD SITE NOT SPECIFIED  Final   Special Requests   Final    BOTTLES DRAWN AEROBIC AND ANAEROBIC Blood  Culture adequate volume   Culture   Final    NO GROWTH < 24 HOURS Performed at [facility], [place] [address] [place], [place] [address]    Report Status PENDING  Incomplete  Surgical PCR screen     Status: None   Collection Time: 01/05/22  3:54 PM   Specimen: Nasal Mucosa; Nasal Swab  Result Value Ref Range Status   MRSA, PCR NEGATIVE NEGATIVE Final   Staphylococcus aureus NEGATIVE NEGATIVE Final    Comment: (NOTE) The Xpert SA Assay (FDA approved for NASAL specimens in patients 76 years of age and older), is one component of a comprehensive surveillance program. It is not intended to diagnose infection nor to guide or monitor treatment. Performed at [facility], [place] [address], [place], [place] [address]      Radiology Studies: No results found.   Marzetta Board, MD, PhD [facility]  Between 7 am - 7 pm I am available, please contact me via Amion (for emergencies) or Securechat (non urgent messages)  Between 7 pm - 7 am I am not available, please contact night coverage MD/APP via Amion

## 2022-01-06 NOTE — Consult Note (Addendum)
[facility] for Infectious Disease    Date of Admission:  01/04/2022     Reason for Consult: left foot diabetic om/abscess    Referring Provider: Cruzita Lederer    Abx: 11/26-c vanc 11/26-c ceftriaxone 11/26-c metronidazole  Outpatient short courses x3 amox-clav  --> bactrim --> bactrim 11/09-11/19 last course        Assessment: 56 yo female with dm2 and chronic left foot ulcer next to 2nd mt head here for same  Clinically c/w chronic ulcer with 2nd prox phalanx om and abscess around 2nd mtp joint, as suggested by recent mri 11/14  Likely need debridement of necrotic bone which she is awaiting   No sign of sepsis. Bcx this admisison negative. No previous cx. Had recent abx 11/09-11/19 with bactrim  Plan: Continue empiric abx Await surgical debridement and we can decide abx based on finding and what is to be done and culture Discussed with primary team    I spent 75 minute reviewing data/chart, and coordinating care and >50% direct face to face time providing counseling/discussing diagnostics/treatment plan with patient    ------------------------------------------------ Principal Problem:   Osteomyelitis of second toe of left foot (HCC) Active Problems:   Chronic pain syndrome   Coronary artery disease   Hyperlipidemia   Hypertension   Uncontrolled type 2 diabetes mellitus with hyperglycemia, without long-term current use of insulin ([place])   Mood disorder ([place])    HPI: Brenda AUGHENBAUGH is a 56 y.o. female dm2 admitted 11/25 for nonhealing ulcer concerning for 2nd mt om  Patient is followed by dr Sharol Given. It appears she has had some ulcer/callous for the last year at the left foot 2nd mt area. This had broken since June per her report, and not healing. She had received 3 courses of abx 10 days each in the last month with amox-clav bactrim then bactrim again  The last 5-7 days noticed mal-odor but no fever/chill so came for evaluation  She has an mri on  11/14 that does suggest 2nd prox phalanx om and abscess around 2nd mtp joint  She is not febrile this admission and has no leukocytosis Her crp is actually normal in setting recent abx. Sed rate  however is mildly elevated  She is awaiting I&D by dr Sharol Given  Has some chest pain complaint on admission too but no sign of cardiac ischemia. Trop negative x2. 2021 stress test unremarkable. Resolved sx. On aspirin  Family History  Problem Relation Age of Onset   Diabetes Mother    Diabetes Father     Social History   Tobacco Use   Smoking status: Former    Types: Cigarettes    Quit date: 1990    Years since quitting: 33.9   Smokeless tobacco: Never  Vaping Use   Vaping Use: Never used  Substance Use Topics   Alcohol use: Yes    Comment: occasionally   Drug use: No    Allergies  Allergen Reactions   Insulin Aspart Other (See Comments) and Swelling    adverse reaction to Novolog    Oxycodone-Acetaminophen Nausea Only   Latex Itching, Swelling and Rash   Morphine Itching and Rash    Review of Systems: ROS All Other ROS was negative, except mentioned above   Past Medical History:  Diagnosis Date   Anxiety 01/22/2016   Cardiac murmur 02/01/2019   Cardiomyopathy, secondary ([place]) 09/06/2009   Qualifier: Diagnosis of  By: Arvid Right   Formatting  of this note might be different from the original. Overview:  Qualifier: Diagnosis of  By: Arvid Right   Chronic kidney disease, stage III (moderate) ([place]) 05/23/2014   Chronic pain syndrome 01/23/2016   Coronary artery disease    30% lesions noted   Depression    Diabetes type 2, uncontrolled 05/23/2014   Diabetic gastroparesis associated with type 1 diabetes mellitus ([place]) 11/27/2016   DIABETIC PERIPHERAL NEUROPATHY 09/21/2009   Qualifier: Diagnosis of  By: Amil Amen MD, [place] hypertension 08/30/2009   Qualifier: Diagnosis of  By: Lovette Cliche, CNA, Christy     Gastroesophageal  reflux disease 07/30/2009   Formatting of this note might be different from the original. Overview:  Overview:  Qualifier: Diagnosis of  By: Amil Amen MD, Elby Showers: Diagnosis of  By: Chester Holstein NP, Ellwood Handler of this note might be different from the original. Overview:  Qualifier: Diagnosis of  By: Amil Amen MD, Elby Showers: Diagnosis of  By: Chester Holstein NP, Nevin Bloodgood   GERD (gastroesophageal reflux disease)    History of cervical cancer 08/17/2015   Status post partial hysterectomy  Formatting of this note might be different from the original. Status post partial hysterectomy   Hyperlipidemia    INSOMNIA 09/21/2009   Qualifier: Diagnosis of  By: Amil Amen MD, [name]     Lumbar facet arthropathy 05/14/2016   Lumbar spinal stenosis 02/11/2018   Metatarsalgia of both feet 03/11/2017   Migraine 09/13/2012   Overview:  IMPRESSION: possible abd migraine   TOBACCO ABUSE 09/21/2009   Qualifier: Diagnosis of  By: Amil Amen MD, Winona Legato of this note might be different from the original. Overview:  Qualifier: Diagnosis of  By: Amil Amen MD, [name]   Trigeminal neuralgia    ULCER-GASTRIC 09/28/2009   Qualifier: Diagnosis of  By: Chester Holstein NP, Lyn Records, CANDIDAL 12/25/2009   Qualifier: Diagnosis of  By: Amil Amen MD, Tessa Lerner D deficiency 04/25/2013       Scheduled Meds:  amLODipine  5 mg Oral Daily   aspirin EC  81 mg Oral Daily   atorvastatin  80 mg Oral Daily   docusate sodium  100 mg Oral BID   gabapentin  600 mg Oral TID   insulin aspart  0-15 Units Subcutaneous TID WC   insulin aspart  0-5 Units Subcutaneous QHS   insulin glargine-yfgn  20 Units Subcutaneous QHS   sodium chloride flush  3 mL Intravenous Q12H   Continuous Infusions:  sodium chloride 75 mL/hr at 01/06/22 0450    ceFAZolin (ANCEF) IV     cefTRIAXone (ROCEPHIN)  IV 2 g (01/06/22 1108)   metronidazole 500 mg (01/06/22 1159)   vancomycin 1,500 mg (01/06/22 1314)    PRN Meds:.acetaminophen **OR** acetaminophen, albuterol, bisacodyl, busPIRone, hydrALAZINE, HYDROmorphone (DILAUDID) injection, methocarbamol, ondansetron **OR** ondansetron (ZOFRAN) IV, polyethylene glycol, promethazine   OBJECTIVE: Blood pressure 129/83, pulse 88, temperature 98.5 F (36.9 C), temperature source Oral, resp. rate 18, height 6' (1.829 m), weight 86.2 kg, SpO2 100 %.  Physical Exam  General/constitutional: no distress, pleasant HEENT: Normocephalic, PER, Conj Clear, EOMI, Oropharynx clear Neck supple CV: rrr no mrg Lungs: clear to auscultation, normal respiratory effort Abd: Soft, Nontender Ext: no edema Skin: No Rash Neuro: nonfocal MSK: left incision anterior shin abscess fully healed. Left foot sole ulcer with purulence --  picture from admission reviewed; no surrounding cellulitis     Lab Results Lab Results  Component  Value Date   WBC 7.9 01/06/2022   HGB 11.2 (L) 01/06/2022   HCT 32.4 (L) 01/06/2022   MCV 88.0 01/06/2022   PLT 307 01/06/2022    Lab Results  Component Value Date   CREATININE 0.98 01/06/2022   BUN 21 (H) 01/06/2022   NA 136 01/06/2022   K 4.7 01/06/2022   CL 103 01/06/2022   CO2 23 01/06/2022    Lab Results  Component Value Date   ALT 15 08/26/2021   AST 19 08/26/2021   ALKPHOS 83 08/26/2021   BILITOT 0.3 08/26/2021      Microbiology: Recent Results (from the past 240 hour(s))  Culture, blood (Routine X 2) w Reflex to ID Panel     Status: None (Preliminary result)   Collection Time: 01/05/22 12:47 PM   Specimen: BLOOD  Result Value Ref Range Status   Specimen Description BLOOD SITE NOT SPECIFIED  Final   Special Requests IN PEDIATRIC BOTTLE Blood Culture adequate volume  Final   Culture   Final    NO GROWTH < 24 HOURS Performed at [facility], [place] [address] [place], [place] [address]    Report Status PENDING  Incomplete  Culture, blood (Routine X 2) w Reflex to ID Panel     Status: None (Preliminary  result)   Collection Time: 01/05/22  1:08 PM   Specimen: BLOOD  Result Value Ref Range Status   Specimen Description BLOOD SITE NOT SPECIFIED  Final   Special Requests   Final    BOTTLES DRAWN AEROBIC AND ANAEROBIC Blood Culture adequate volume   Culture   Final    NO GROWTH < 24 HOURS Performed at [facility], [place] [address], [place], [place] [address]    Report Status PENDING  Incomplete  Surgical PCR screen     Status: None   Collection Time: 01/05/22  3:54 PM   Specimen: Nasal Mucosa; Nasal Swab  Result Value Ref Range Status   MRSA, PCR NEGATIVE NEGATIVE Final   Staphylococcus aureus NEGATIVE NEGATIVE Final    Comment: (NOTE) The Xpert SA Assay (FDA approved for NASAL specimens in patients 1 years of age and older), is one component of a comprehensive surveillance program. It is not intended to diagnose infection nor to guide or monitor treatment. Performed at [facility], [place] [address] [place], [place] [address]      Serology:    Imaging: If present, new imagings (plain films, ct scans, and mri) have been personally visualized and interpreted; radiology reports have been reviewed. Decision making incorporated into the Impression / Recommendations.  11/14 mri left foot (prior to admission) 1. Open wound along the plantar aspect of the forefoot at the level of the proximal phalanx of the second toe. There is an underlying fluid collection wrapping around the plantar aspect of the second MTP joint suspicious for abscess. 2. Mild edema like signal abnormality in the proximal phalanx of the second toe. Given the nearby open wound and probable abscess this is likely osteomyelitis. 3. Diffuse subcutaneous soft tissue swelling/edema involving the dorsum of the foot suggesting cellulitis. Mild diffuse myositis without findings to suggest pyomyositis. 4. Postoperative changes involving the first metatarsal head with advanced degenerative changes at the  first MTP joint and mild hallux valgus deformity. No findings suspicious for septic arthritis or osteomyelitis. 5. Chronic deformity involving the third metatarsal head may be related to prior trauma, AVN or surgery.  Jabier Mutton, [place] [facility] Infectious  D[facility] [phone] pager    01/06/2022, 2:33 PM

## 2022-01-06 NOTE — Op Note (Signed)
01/06/2022  7:36 PM  PATIENT:  [name]    PRE-OPERATIVE DIAGNOSIS:  Osteomyeltis Left Foot  POST-OPERATIVE DIAGNOSIS:  Same  PROCEDURE:  AMPUTATION 2nd ray left foot Application of Kerecis micro powder 38 cm to fill a void 100 cm.  SURGEON:  [name], MD  PHYSICIAN ASSISTANT:None ANESTHESIA:   General  PREOPERATIVE INDICATIONS:  [name] is a  56 y.o. female with a diagnosis of Osteomyeltis Left Foot who failed conservative measures and elected for surgical management.    The risks benefits and alternatives were discussed with the patient preoperatively including but not limited to the risks of infection, bleeding, nerve injury, cardiopulmonary complications, the need for revision surgery, among others, and the patient was willing to proceed.  OPERATIVE IMPLANTS: Kerecis micro powder 38 cm.  _0 @  OPERATIVE FINDINGS: Tissue margins were clear good petechial bleeding.  OPERATIVE PROCEDURE: Patient was brought to the operating room and underwent a MAC anesthetic.  After adequate levels anesthesia were obtained patient's left lower extremity was prepped using DuraPrep draped into a sterile field a timeout was called.  17 cc of lidocaine plain was used for local infiltration.  A V incision was made dorsally and this included the plantar ulcer.  The second ray and ulcerative tissue was resected in 1 block of tissue.  Further bone was debrided.  The wound was irrigated with normal saline electrocautery was used for hemostasis.  The wound was filled with 38 cm of Kerecis micro powder to fill a wound void of 100 cm.  2-0 nylon was used to close the incision.  Sterile dressing was applied patient was taken the PACU in stable condition.   DISCHARGE PLANNING:  Antibiotic duration: Continue IV antibiotics for 24 hours.  Weightbearing: Touchdown weightbearing on the left  Pain medication: Opioid pathway  Dressing care/ Wound VAC: Dry dressing reinforce  as needed  Ambulatory devices: Crutches or walker  Discharge to: Anticipate discharge to home.  Follow-up: In the office 1 week post operative.

## 2022-01-06 NOTE — Progress Notes (Signed)
Due to her supposedly novolog allergy, change SSI to regular insulin and FYI Dr. Cruzita Lederer.  Onnie Boer, PharmD, BCIDP, AAHIVP, CPP Infectious Disease Pharmacist 01/06/2022 6:08 PM

## 2022-01-06 NOTE — Interval H&P Note (Signed)
History and Physical Interval Note:  01/06/2022 6:57 AM  [name]  has presented today for surgery, with the diagnosis of Osteomyeltis Left Foot.  The various methods of treatment have been discussed with the patient and family. After consideration of risks, benefits and other options for treatment, the patient has consented to  Procedure(s): AMPUTATION 2nd TOE (Left) as a surgical intervention.  The patient's history has been reviewed, patient examined, no change in status, stable for surgery.  I have reviewed the patient's chart and labs.  Questions were answered to the patient's satisfaction.     [name]

## 2022-01-06 NOTE — Transfer of Care (Signed)
Immediate Anesthesia Transfer of Care Note  Patient: [name]  Procedure(s) Performed: AMPUTATION 2nd TOE (Left)  Patient Location: PACU  Anesthesia Type:MAC  Level of Consciousness: awake  Airway & Oxygen Therapy: Patient Spontanous Breathing  Post-op Assessment: Report given to RN and Post -op Vital signs reviewed and stable  Post vital signs: Reviewed and stable  Last Vitals:  Vitals Value Taken Time  BP 108/77 01/06/22 1935  Temp 36.8 C 01/06/22 1935  Pulse 83 01/06/22 1942  Resp 11 01/06/22 1942  SpO2 93 % 01/06/22 1942  Vitals shown include unvalidated device data.  Last Pain:  Vitals:   01/06/22 1935  TempSrc:   PainSc: 0-No pain      Patients Stated Pain Goal: 3 (78/29/56 2130)  Complications: No notable events documented.

## 2022-01-06 NOTE — Anesthesia Postprocedure Evaluation (Signed)
Anesthesia Post Note  Patient: [name]  Procedure(s) Performed: AMPUTATION 2nd TOE (Left)     Patient location during evaluation: PACU Anesthesia Type: MAC Level of consciousness: awake and alert, patient cooperative and oriented Pain management: pain level controlled Vital Signs Assessment: post-procedure vital signs reviewed and stable Respiratory status: spontaneous breathing, nonlabored ventilation and respiratory function stable Cardiovascular status: blood pressure returned to baseline and stable Postop Assessment: adequate PO intake Anesthetic complications: no   No notable events documented.  Last Vitals:  Vitals:   01/06/22 1950 01/06/22 2000  BP: 112/77 113/86  Pulse: 88 85  Resp: 18 13  Temp:  36.4 C  SpO2: 99% 98%    Last Pain:  Vitals:   01/06/22 2000  TempSrc:   PainSc: 0-No pain    LLE Motor Response: Purposeful movement (01/06/22 2000) LLE Sensation: Full sensation (01/06/22 2000) RLE Motor Response: Purposeful movement (01/06/22 2000) RLE Sensation: Full sensation (01/06/22 2000)      Seleta Rhymes. Danyia Borunda

## 2022-01-06 NOTE — Progress Notes (Signed)
Upon review of orders noted order for pt to have G2 drink prior to 0430 am per ERAS protocol. G2 not available on unit.  Contacted pharmacy who does not stock G2 and reports it comes from dietary but they are not here at night.  RN contacted 5 other units and also do not have G2. Contacted AC who also reports G2 comes from dietary but unable to access it at night.  Pt informed of G2 unavailable.

## 2022-01-07 ENCOUNTER — Encounter (HOSPITAL_COMMUNITY): Payer: Self-pay | Admitting: Orthopedic Surgery

## 2022-01-07 ENCOUNTER — Other Ambulatory Visit: Payer: Self-pay

## 2022-01-07 ENCOUNTER — Inpatient Hospital Stay (HOSPITAL_COMMUNITY): Payer: Commercial Managed Care - HMO

## 2022-01-07 DIAGNOSIS — Z7984 Long term (current) use of oral hypoglycemic drugs: Secondary | ICD-10-CM | POA: Diagnosis not present

## 2022-01-07 DIAGNOSIS — M869 Osteomyelitis, unspecified: Secondary | ICD-10-CM | POA: Diagnosis not present

## 2022-01-07 DIAGNOSIS — E1169 Type 2 diabetes mellitus with other specified complication: Secondary | ICD-10-CM | POA: Diagnosis not present

## 2022-01-07 DIAGNOSIS — M86172 Other acute osteomyelitis, left ankle and foot: Secondary | ICD-10-CM | POA: Diagnosis not present

## 2022-01-07 LAB — URINALYSIS, ROUTINE W REFLEX MICROSCOPIC
Bilirubin Urine: NEGATIVE
Glucose, UA: 150 mg/dL — AB
Hgb urine dipstick: NEGATIVE
Ketones, ur: NEGATIVE mg/dL
Nitrite: NEGATIVE
Protein, ur: 30 mg/dL — AB
Specific Gravity, Urine: 1.018 (ref 1.005–1.030)
pH: 5 (ref 5.0–8.0)

## 2022-01-07 LAB — GLUCOSE, CAPILLARY
Glucose-Capillary: 157 mg/dL — ABNORMAL HIGH (ref 70–99)
Glucose-Capillary: 189 mg/dL — ABNORMAL HIGH (ref 70–99)
Glucose-Capillary: 119 mg/dL — ABNORMAL HIGH (ref 70–99)
Glucose-Capillary: 180 mg/dL — ABNORMAL HIGH (ref 70–99)
Glucose-Capillary: 204 mg/dL — ABNORMAL HIGH (ref 70–99)
Glucose-Capillary: 247 mg/dL — ABNORMAL HIGH (ref 70–99)

## 2022-01-07 LAB — CBC
HCT: 37.1 % (ref 36.0–46.0)
Hemoglobin: 12.2 g/dL (ref 12.0–15.0)
MCH: 29.5 pg (ref 26.0–34.0)
MCHC: 32.9 g/dL (ref 30.0–36.0)
MCV: 89.8 fL (ref 80.0–100.0)
Platelets: 346 10*3/uL (ref 150–400)
RBC: 4.13 MIL/uL (ref 3.87–5.11)
RDW: 14.9 % (ref 11.5–15.5)
WBC: 6.5 10*3/uL (ref 4.0–10.5)
nRBC: 0 % (ref 0.0–0.2)

## 2022-01-07 LAB — COMPREHENSIVE METABOLIC PANEL
ALT: 18 U/L (ref 0–44)
AST: 22 U/L (ref 15–41)
Albumin: 3.1 g/dL — ABNORMAL LOW (ref 3.5–5.0)
Alkaline Phosphatase: 90 U/L (ref 38–126)
Anion gap: 7 (ref 5–15)
BUN: 14 mg/dL (ref 6–20)
CO2: 25 mmol/L (ref 22–32)
Calcium: 8.8 mg/dL — ABNORMAL LOW (ref 8.9–10.3)
Chloride: 107 mmol/L (ref 98–111)
Creatinine, Ser: 1.1 mg/dL — ABNORMAL HIGH (ref 0.44–1.00)
GFR, Estimated: 59 mL/min — ABNORMAL LOW (ref >60)
Glucose, Bld: 303 mg/dL — ABNORMAL HIGH (ref 70–99)
Potassium: 4.4 mmol/L (ref 3.5–5.1)
Sodium: 139 mmol/L (ref 135–145)
Total Bilirubin: 0.3 mg/dL (ref 0.3–1.2)
Total Protein: 7.2 g/dL (ref 6.5–8.1)

## 2022-01-07 LAB — MAGNESIUM: Magnesium: 1.7 mg/dL (ref 1.7–2.4)

## 2022-01-07 MED ORDER — OXYCODONE HCL 5 MG PO TABS
10.0000 mg | ORAL_TABLET | ORAL | Status: DC | PRN
  Administered 2022-01-07 – 2022-01-08 (×3): 15 mg via ORAL
  Filled 2022-01-07 (×3): qty 3

## 2022-01-07 MED ORDER — METRONIDAZOLE 500 MG/100ML IV SOLN: 500.0000 mg | Freq: Two times a day (BID) | INTRAVENOUS | Status: DC

## 2022-01-07 MED ORDER — DOXYCYCLINE HYCLATE 100 MG PO TABS
100.0000 mg | ORAL_TABLET | Freq: Two times a day (BID) | ORAL | Status: DC
  Administered 2022-01-07 – 2022-01-08 (×3): 100 mg via ORAL
  Filled 2022-01-07 (×3): qty 1

## 2022-01-07 MED ORDER — AMOXICILLIN-POT CLAVULANATE 875-125 MG PO TABS
1.0000 | ORAL_TABLET | Freq: Two times a day (BID) | ORAL | Status: DC
  Administered 2022-01-07 – 2022-01-08 (×3): 1 via ORAL
  Filled 2022-01-07 (×3): qty 1

## 2022-01-07 MED FILL — Insulin Regular (Human) Inj 100 Unit/ML: INTRAMUSCULAR | Qty: 0.15 | Status: AC

## 2022-01-07 NOTE — TOC Progression Note (Signed)
Transition of Care [facility]) - Progression Note    Patient Details  Name: [name] MRN: [id number] Date of Birth: Mar 20, 1965  Transition of Care [facility]) CM/SW [place], [place] Phone Number: 01/07/2022, 3:22 PM  Clinical Narrative:     CSW received consult that pt wanted to speak about SSD, CSW spoke with pt via phone, provided the phone number to pt as well as placed on AVS, no other concerns voiced.         Expected Discharge Plan and Services                                                 Social Determinants of Health (SDOH) Interventions    Readmission Risk Interventions     No data to display

## 2022-01-07 NOTE — Progress Notes (Signed)
Orthopedic Tech Progress Note Patient Details:  [name] May 20, 1965 [id number]  Ortho Devices Type of Ortho Device: Postop shoe/boot Ortho Device/Splint Location: lle Ortho Device/Splint Interventions: Ordered, Application, Adjustment   Post Interventions Patient Tolerated: Well Instructions Provided: Care of device, Adjustment of device  [name] 01/07/2022, 6:23 AM

## 2022-01-07 NOTE — Progress Notes (Signed)
   01/07/22 1525  Clinical Encounter Type  Visited With Patient  Visit Type Social support  Referral From Chaplain  Consult/Referral To [place] visited with the patient, Mckenlee during time on the unit. Angelette was good spirits and happy to receive me. She shared that she is taking her recovery one day at a time is content with her recovery. Radley is hopeful that she will be discharged soon.  Danice Goltz [facility]  [phone]

## 2022-01-07 NOTE — Evaluation (Signed)
Physical Therapy Evaluation and DIscharge Patient Details Name: [name] MRN: [id number] DOB: 1965/11/02 Today's Date: 01/07/2022  History of Present Illness  Pt is 56 yo female who presents for amputation of L 2nd toe on 01/06/22 due to osteomyelitis. PMH: DM with neuropathy, gastroparesis, CAD, HTN, HLD, chronic pain.  Clinical Impression  Patient evaluated by Physical Therapy with no further acute PT needs identified. All education has been completed and the patient has no further questions. Pt independent with mobility with RW. Shows good understanding of TDWB status. Son can assist at home if she needs it. Educated on strengthening exercises for LLE while TDWB as well as activity level, elevation, and foot protection. See below for any follow-up Physical Therapy or equipment needs. PT is signing off. Thank you for this referral.        Recommendations for follow up therapy are one component of a multi-disciplinary discharge planning process, led by the attending physician.  Recommendations may be updated based on patient status, additional functional criteria and insurance authorization.  Follow Up Recommendations No PT follow up      Assistance Recommended at Discharge PRN  Patient can return home with the following  Assistance with cooking/housework;Assist for transportation    Equipment Recommendations Rolling walker (2 wheels)  Recommendations for Other Services       Functional Status Assessment Patient has had a recent decline in their functional status and demonstrates the ability to make significant improvements in function in a reasonable and predictable amount of time.     Precautions / Restrictions Precautions Precautions: None Required Braces or Orthoses: Other Brace Other Brace: post op shoe L Restrictions Weight Bearing Restrictions: Yes LLE Weight Bearing: Touchdown weight bearing Other Position/Activity Restrictions: educated pt on TDWB status       Mobility  Bed Mobility Overal bed mobility: Independent                  Transfers Overall transfer level: Independent Equipment used: Rolling walker (2 wheels)               General transfer comment: pt up independently in room    Ambulation/Gait Ambulation/Gait assistance: Modified independent (Device/Increase time) Gait Distance (Feet): 100 Feet Assistive device: Rolling walker (2 wheels) Gait Pattern/deviations: Step-to pattern Gait velocity: WFL Gait velocity interpretation: >4.37 ft/sec, indicative of normal walking speed   General Gait Details: pt able to maintain TDWB with use of RW. Instructed to wear shoe on R foot to protect and balance pt with post op shoe.  Stairs            Wheelchair Mobility    Modified Rankin (Stroke Patients Only)       Balance Overall balance assessment: Modified Independent                                           Pertinent Vitals/Pain Pain Assessment Pain Assessment: 0-10 Pain Score: 7  Pain Location: L second toe Pain Descriptors / Indicators: Aching Pain Intervention(s): Limited activity within patient's tolerance, Monitored during session, Repositioned, Premedicated before session    Home Living Family/patient expects to be discharged to:: Private residence Living Arrangements: Alone Available Help at Discharge: Friend(s);Available PRN/intermittently Type of Home: Apartment Home Access: Stairs to enter   Entrance Stairs-Number of Steps: 1   Home Layout: One level Home Equipment: None Additional Comments: 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, lots of standing, part time as sitter    Prior Function Prior Level of Function : Independent/Modified Independent;Working/employed;Driving                     Hand Dominance   Dominant Hand: Right    Extremity/Trunk Assessment   Upper Extremity Assessment Upper Extremity Assessment: Overall WFL for tasks assessed     Lower Extremity Assessment Lower Extremity Assessment: Overall WFL for tasks assessed    Cervical / Trunk Assessment Cervical / Trunk Assessment: Normal  Communication   Communication: No difficulties  Cognition Arousal/Alertness: Awake/alert Behavior During Therapy: WFL for tasks assessed/performed Overall Cognitive Status: Within Functional Limits for tasks assessed                                          General Comments General comments (skin integrity, edema, etc.): reviewed RLE strengthening exercises to do while TDWB. Discussed elevation and frequency of mobility. Pt's son can come help if needed    Exercises     Assessment/Plan    PT Assessment Patient does not need any further PT services  PT Problem List         PT Treatment Interventions      PT Goals (Current goals can be found in the Care Plan section)  Acute Rehab PT Goals Patient Stated Goal: return to home and work PT Goal Formulation: All assessment and education complete, DC therapy    Frequency       Co-evaluation               AM-PAC PT "6 Clicks" Mobility  Outcome Measure Help needed turning from your back to your side while in a flat bed without using bedrails?: None Help needed moving from lying on your back to sitting on the side of a flat bed without using bedrails?: None Help needed moving to and from a bed to a chair (including a wheelchair)?: None Help needed standing up from a chair using your arms (e.g., wheelchair or bedside chair)?: None Help needed to walk in hospital room?: None Help needed climbing 3-5 steps with a railing? : None 6 Click Score: 24    End of Session   Activity Tolerance: Patient tolerated treatment well Patient left: in chair;with call bell/phone within reach Nurse Communication: Mobility status PT Visit Diagnosis: Unsteadiness on feet (R26.81);Pain Pain - Right/Left: Left Pain - part of body: Ankle and joints of foot    Time:  1230-1256 PT Time Calculation (min) (ACUTE ONLY): 26 min   Charges:   PT Evaluation $PT Eval Moderate Complexity: 1 Mod PT Treatments $Gait Training: 8-22 mins        Leighton Roach, PT  Acute Rehab Services Secure chat preferred Office [place] 01/07/2022, 1:48 PM

## 2022-01-07 NOTE — Progress Notes (Signed)
Patient ID: [name], female   DOB: December 01, 1965, 56 y.o.   MRN: [id number] Patient is postoperative day 1 left second ray amputation with application of Kerecis micro powder 38 cm.  The dressing is clean and dry.  Discussed the importance of minimizing weightbearing on her left foot and elevation.  Will follow-up in the office 1 week after discharge to change the dressing.  Patient may discharge when safe with therapy.  No further antibiotics necessary.

## 2022-01-07 NOTE — Progress Notes (Signed)
Diagnosis: Diabetic foot ulcer/om/infecion  Culture Result: none  S/p 2nd partial ray amputation left foot and I&D. Dr Sharol Given comfortable he got clean bone margin   -finish mop-up therapy for abscess/cellulitis with 5 day doxy/amox-clav -f/u with dr Sharol Given as advised -no need for id f/u   ------------ Subjective S/p partial 2nd ray 11/27 Pain being managed   Objective: Vitals:   01/07/22 0431 01/07/22 0759  BP: (!) 127/91 (!) 119/93  Pulse: 94 98  Resp: 18 16  Temp: 99 F (37.2 C) 99.1 F (37.3 C)  SpO2: 98% 100%   General/constitutional: no distress, pleasant HEENT: Normocephalic, PER, Conj Clear, EOMI, Oropharynx clear Neck supple CV: rrr no mrg Lungs: clear to auscultation, normal respiratory effort Abd: Soft, Nontender Ext: no edema Skin: No Rash Neuro: nonfocal MSK: skin dressing c/d left foot  Labs: Lab Results  Component Value Date   WBC 6.5 01/07/2022   HGB 12.2 01/07/2022   HCT 37.1 01/07/2022   MCV 89.8 01/07/2022   PLT 346 81/85/6314   Last metabolic panel Lab Results  Component Value Date   GLUCOSE 303 (H) 01/07/2022   NA 139 01/07/2022   K 4.4 01/07/2022   CL 107 01/07/2022   CO2 25 01/07/2022   BUN 14 01/07/2022   CREATININE 1.10 (H) 01/07/2022   GFRNONAA 59 (L) 01/07/2022   CALCIUM 8.8 (L) 01/07/2022   PROT 7.2 01/07/2022   ALBUMIN 3.1 (L) 01/07/2022   BILITOT 0.3 01/07/2022   ALKPHOS 90 01/07/2022   AST 22 01/07/2022   ALT 18 01/07/2022   ANIONGAP 7 01/07/2022

## 2022-01-07 NOTE — Progress Notes (Signed)
PROGRESS NOTE  [name] OHF:[id number] DOB: May 11, 1965 DOA: 01/04/2022 PCP: Mackie Pai, PA-C   LOS: 2 days   Brief Narrative / Interim history: 56 year old female with DM with neuropathy, gastroparesis, CAD, chronic pain, HTN, HLD comes into the hospital with a nonhealing foot wound.  She has been having increased sharp pain in the foot, worsening smell over the last few days.  She has been followed regularly by Dr. Sharol Given, and completed few rounds of antibiotics.  MRI on 11/14 with concern for osteomyelitis.  Subjective / 24h Interval events: She tells me she in severe pain on her left foot, just got some IV Dilaudid this morning as well as oral oxycodone but is still in pain.  Also complains of dysuria and foul-smelling urine.  Assesement and Plan: Principal Problem:   Osteomyelitis of second toe of left foot (HCC) Active Problems:   Uncontrolled type 2 diabetes mellitus with hyperglycemia, without long-term current use of insulin (HCC)   Chronic pain syndrome   Coronary artery disease   Hyperlipidemia   Hypertension   Mood disorder ([place])   Principal problem Diabetic foot ulcer, osteomyelitis-Prior foot MRI on 11/14 with concern for abscess and likely osteo at the 2nd MTP.  She apparently failed Augmentin and Bactrim recently.  Orthopedic surgery consulted, she is status post secondary left foot amputation.  Infectious disease consulted, discussed with Dr. Gale Journey this morning, he recommends 5 days of doxycycline and Augmentin postoperatively. -Still in significant pain postoperative day 1, increase oxycodone 10-50 mg every 4, advised patient to try to use p.o. instead of IV, potentially home on Wednesday if pain better  Active problems Essential hypertension-continue amlodipine, reports that she is not taking Coreg.  Blood sugar controlled  Hyperlipidemia-continue statin  Coronary artery disease-continue aspirin, reported some chest pain in the setting of anxiety,  high-sensitivity troponin negative x 2, 5 >> 4.  Stress test 2021 unremarkable per cardiology  Mood disorder-continue BuSpar  DM2, poorly controlled, with hyperglycemia-last A1c is 9.8.  Has been placed on glargine along with sliding scale.  Monitor CBGs, controlled  CBG (last 3)  Recent Labs    01/06/22 1934 01/06/22 2025 01/07/22 0726  GLUCAP 148* 126* 189*    Scheduled Meds:  amLODipine  5 mg Oral Daily   amoxicillin-clavulanate  1 tablet Oral Q12H   vitamin C  1,000 mg Oral Daily   aspirin EC  81 mg Oral Daily   atorvastatin  80 mg Oral Daily   docusate sodium  100 mg Oral BID   doxycycline  100 mg Oral Q12H   gabapentin  600 mg Oral TID   insulin glargine-yfgn  20 Units Subcutaneous QHS   insulin regular  0-15 Units Subcutaneous TID WC   insulin regular  0-5 Units Subcutaneous QHS   multivitamin with minerals  1 tablet Oral Daily   nutrition supplement (JUVEN)  1 packet Oral BID BM   pantoprazole  40 mg Oral Daily   sodium chloride flush  3 mL Intravenous Q12H   zinc sulfate  220 mg Oral Daily   Continuous Infusions:  sodium chloride Stopped (01/07/22 0600)   sodium chloride     magnesium sulfate bolus IVPB     PRN Meds:.acetaminophen **OR** acetaminophen, albuterol, alum & mag hydroxide-simeth, bisacodyl, busPIRone, guaiFENesin-dextromethorphan, hydrALAZINE, hydrALAZINE, HYDROmorphone (DILAUDID) injection, HYDROmorphone (DILAUDID) injection, labetalol, magnesium citrate, magnesium sulfate bolus IVPB, methocarbamol, metoprolol tartrate, ondansetron **OR** ondansetron (ZOFRAN) IV, oxyCODONE, oxyCODONE, phenol, polyethylene glycol, potassium chloride, promethazine  Current Outpatient Medications  Medication Instructions  albuterol (VENTOLIN HFA) 108 (90 Base) MCG/ACT inhaler 2 puffs, Inhalation, Every 6 hours PRN   amLODipine (NORVASC) 5 mg, Oral, Daily   aspirin 81 mg, Oral, Daily   atorvastatin (LIPITOR) 80 mg, Oral, Daily   busPIRone (BUSPAR) 15 mg, Oral, 2 times  daily   carvedilol (COREG) 25 mg, Oral, 2 times daily with meals   fluticasone (FLONASE) 50 MCG/ACT nasal spray 2 sprays, Each Nare, Daily   fluticasone furoate-vilanterol (BREO ELLIPTA) 100-25 MCG/INH AEPB 1 puff, Inhalation, Daily   gabapentin (NEURONTIN) 600 mg, Oral, 3 times daily   insulin glargine-yfgn (SEMGLEE) 40 Units, Subcutaneous, Daily at bedtime   metFORMIN (GLUCOPHAGE-XR) 500 mg, Oral, Daily with breakfast   methocarbamol (ROBAXIN) 750 mg, Oral, Every 8 hours PRN   nitroGLYCERIN (NITROSTAT) 0.4 mg, Sublingual, Every 5 min PRN   oxyCODONE (OXY IR/ROXICODONE) 5 mg, Oral, Every 6 hours PRN, 1 tab po bid prn severe pain   promethazine (PHENERGAN) 12.5 mg, Oral, Every 8 hours PRN   topiramate (TOPAMAX) 50 mg, Oral, 2 times daily   VITAMIN D PO 1 capsule, Oral, Daily    Diet Orders (From admission, onward)     Start     Ordered   01/06/22 2125  Diet Carb Modified Fluid consistency: Thin; Room service appropriate? Yes  Diet effective now       Question Answer Comment  Calorie Level Medium 1600-2000   Fluid consistency: Thin   Room service appropriate? Yes      01/06/22 2124            DVT prophylaxis: SCD's Start: 01/06/22 2125 SCDs Start: 01/05/22 1001   Lab Results  Component Value Date   PLT 346 01/07/2022      Code Status: Full Code  Family Communication: no family at bedside   Status is: Inpatient  Remains inpatient appropriate because: Persistent severe pain  Level of care: Telemetry Surgical  Consultants:  Orthopedic surgery  ID  Objective: Vitals:   01/06/22 2022 01/06/22 2300 01/07/22 0431 01/07/22 0759  BP: 118/83 124/86 (!) 127/91 (!) 119/93  Pulse: 88 89 94 98  Resp: '18 18 18 16  '$ Temp: 98 F (36.7 C) (!) 97.5 F (36.4 C) 99 F (37.2 C) 99.1 F (37.3 C)  TempSrc: Oral Oral Oral Oral  SpO2: 94% 100% 98% 100%  Weight:      Height:        Intake/Output Summary (Last 24 hours) at 01/07/2022 1100 Last data filed at 01/07/2022  0600 Gross per 24 hour  Intake 2652.44 ml  Output 20 ml  Net 2632.44 ml    Wt Readings from Last 3 Encounters:  01/04/22 86.2 kg  08/21/21 81.6 kg  07/26/21 78.9 kg    Examination:  Constitutional: NAD Eyes: lids and conjunctivae normal, no scleral icterus ENMT: mmm Neck: normal, supple Respiratory: clear to auscultation bilaterally, no wheezing, no crackles. Normal respiratory effort.  Cardiovascular: Regular rate and rhythm, no murmurs / rubs / gallops. No LE edema. Abdomen: soft, no distention, no tenderness. Bowel sounds positive.  Skin: no rashes Neurologic: no focal deficits, equal strength  Data Reviewed: I have independently reviewed following labs and imaging studies   CBC Recent Labs  Lab 01/04/22 1933 01/06/22 0300 01/07/22 0337  WBC 6.3 7.9 6.5  HGB 14.6 11.2* 12.2  HCT 45.4 32.4* 37.1  PLT 360 307 346  MCV 90.4 88.0 89.8  MCH 29.1 30.4 29.5  MCHC 32.2 34.6 32.9  RDW 15.3 14.7 14.9  Recent Labs  Lab 01/04/22 1933 01/05/22 1247 01/06/22 0300 01/07/22 0337  NA 136  --  136 139  K 3.8  --  4.7 4.4  CL 104  --  103 107  CO2 24  --  23 25  GLUCOSE 209*  --  248* 303*  BUN 22*  --  21* 14  CREATININE 1.39*  --  0.98 1.10*  CALCIUM 9.1  --  8.6* 8.8*  AST  --   --   --  22  ALT  --   --   --  18  ALKPHOS  --   --   --  90  BILITOT  --   --   --  0.3  ALBUMIN  --   --   --  3.1*  MG  --   --   --  1.7  CRP  --  0.6  --   --      ------------------------------------------------------------------------------------------------------------------ No results for input(s): "CHOL", "HDL", "LDLCALC", "TRIG", "CHOLHDL", "LDLDIRECT" in the last 72 hours.  Lab Results  Component Value Date   HGBA1C 9.8 (H) 08/26/2021   ------------------------------------------------------------------------------------------------------------------ No results for input(s): "TSH", "T4TOTAL", "T3FREE", "THYROIDAB" in the last 72 hours.  Invalid input(s):  "FREET3"  Cardiac Enzymes No results for input(s): "CKMB", "TROPONINI", "MYOGLOBIN" in the last 168 hours.  Invalid input(s): "CK" ------------------------------------------------------------------------------------------------------------------ No results found for: "BNP"  CBG: Recent Labs  Lab 01/06/22 1145 01/06/22 1648 01/06/22 1934 01/06/22 2025 01/07/22 0726  GLUCAP 123* 157* 148* 126* 189*     Recent Results (from the past 240 hour(s))  Culture, blood (Routine X 2) w Reflex to ID Panel     Status: None (Preliminary result)   Collection Time: 01/05/22 12:47 PM   Specimen: BLOOD  Result Value Ref Range Status   Specimen Description BLOOD SITE NOT SPECIFIED  Final   Special Requests IN PEDIATRIC BOTTLE Blood Culture adequate volume  Final   Culture   Final    NO GROWTH 2 DAYS Performed at [facility], [place] [address] [place], [place] [address]    Report Status PENDING  Incomplete  Culture, blood (Routine X 2) w Reflex to ID Panel     Status: None (Preliminary result)   Collection Time: 01/05/22  1:08 PM   Specimen: BLOOD  Result Value Ref Range Status   Specimen Description BLOOD SITE NOT SPECIFIED  Final   Special Requests   Final    BOTTLES DRAWN AEROBIC AND ANAEROBIC Blood Culture adequate volume   Culture   Final    NO GROWTH 2 DAYS Performed at [facility], [address], [place], [place] [address]    Report Status PENDING  Incomplete  Surgical PCR screen     Status: None   Collection Time: 01/05/22  3:54 PM   Specimen: Nasal Mucosa; Nasal Swab  Result Value Ref Range Status   MRSA, PCR NEGATIVE NEGATIVE Final   Staphylococcus aureus NEGATIVE NEGATIVE Final    Comment: (NOTE) The Xpert SA Assay (FDA approved for NASAL specimens in patients 81 years of age and older), is one component of a comprehensive surveillance program. It is not intended to diagnose infection nor to guide or monitor treatment. Performed at [facility],  [place] [address] [place], [place] [address]      Radiology Studies: No results found.   Marzetta Board, MD, PhD [facility]  Between 7 am - 7 pm I am available, please contact me via  Amion (for emergencies) or Securechat (non urgent messages)  Between 7 pm - 7 am I am not available, please contact night coverage MD/APP via Amion

## 2022-01-07 NOTE — Inpatient Diabetes Management (Signed)
Inpatient Diabetes Program Recommendations  AACE/ADA: New Consensus Statement on Inpatient Glycemic Control (2015)  Target Ranges:  Prepandial:   less than 140 mg/dL      Peak postprandial:   less than 180 mg/dL (1-2 hours)      Critically ill patients:  140 - 180 mg/dL   Lab Results  Component Value Date   GLUCAP 119 (H) 01/07/2022   HGBA1C 9.8 (H) 08/26/2021    Review of Glycemic Control  Latest Reference Range & Units 01/06/22 08:16 01/06/22 11:45 01/06/22 16:48 01/06/22 19:34 01/06/22 20:25 01/07/22 07:26 01/07/22 12:18  Glucose-Capillary 70 - 99 mg/dL 175 (H) 123 (H) 157 (H) 148 (H) 126 (H) 189 (H) 119 (H)   Diabetes history: DM 2 Outpatient Diabetes medications: Metformin 500 mg Daily (lantus 40 units qhs in the past) Current orders for Inpatient glycemic control:  Semglee 20 units qhs Novolog 0-15 units tid + hs  A1c 9.8% on 08/26/2021  Inpatient Diabetes Program Recommendations:    -  obtain a more recent A1c as the last one was a result from 08/2021.   Thanks,  Tama Headings RN, MSN, BC-ADM Inpatient Diabetes Coordinator Team Pager [phone] (8a-5p)

## 2022-01-08 ENCOUNTER — Other Ambulatory Visit (HOSPITAL_COMMUNITY): Payer: Self-pay | Admitting: *Deleted

## 2022-01-08 ENCOUNTER — Other Ambulatory Visit (HOSPITAL_COMMUNITY): Payer: Self-pay

## 2022-01-08 ENCOUNTER — Encounter (HOSPITAL_COMMUNITY): Payer: Commercial Managed Care - HMO

## 2022-01-08 ENCOUNTER — Encounter (HOSPITAL_COMMUNITY): Payer: Self-pay | Admitting: Orthopedic Surgery

## 2022-01-08 DIAGNOSIS — E1165 Type 2 diabetes mellitus with hyperglycemia: Secondary | ICD-10-CM | POA: Diagnosis not present

## 2022-01-08 DIAGNOSIS — I10 Essential (primary) hypertension: Secondary | ICD-10-CM

## 2022-01-08 DIAGNOSIS — M869 Osteomyelitis, unspecified: Secondary | ICD-10-CM | POA: Diagnosis not present

## 2022-01-08 LAB — GLUCOSE, CAPILLARY: Glucose-Capillary: 169 mg/dL — ABNORMAL HIGH (ref 70–99)

## 2022-01-08 MED ORDER — AMOXICILLIN-POT CLAVULANATE 875-125 MG PO TABS
1.0000 | ORAL_TABLET | Freq: Two times a day (BID) | ORAL | 0 refills | Status: AC
  Filled 2022-01-08: qty 10, 5d supply, fill #0

## 2022-01-08 MED ORDER — INSULIN ASPART 100 UNIT/ML IJ SOLN: 0.0000 [IU] | Freq: Three times a day (TID) | INTRAMUSCULAR | Status: DC

## 2022-01-08 MED ORDER — POLYETHYLENE GLYCOL 3350 17 G PO PACK
17.0000 g | PACK | Freq: Two times a day (BID) | ORAL | Status: DC
  Filled 2022-01-08: qty 1

## 2022-01-08 MED ORDER — DOXYCYCLINE HYCLATE 100 MG PO TABS
100.0000 mg | ORAL_TABLET | Freq: Two times a day (BID) | ORAL | 0 refills | Status: AC
  Filled 2022-01-08: qty 10, 5d supply, fill #0

## 2022-01-08 MED ORDER — OXYCODONE HCL 10 MG PO TABS
10.0000 mg | ORAL_TABLET | ORAL | 0 refills | Status: AC | PRN
  Filled 2022-01-08: qty 20, 5d supply, fill #0

## 2022-01-08 MED ORDER — INSULIN ASPART 100 UNIT/ML IJ SOLN: 4.0000 [IU] | Freq: Three times a day (TID) | INTRAMUSCULAR | Status: DC

## 2022-01-08 MED ORDER — INSULIN ASPART 100 UNIT/ML IJ SOLN: 0.0000 [IU] | Freq: Every day | INTRAMUSCULAR | Status: DC

## 2022-01-08 MED ORDER — CARVEDILOL 25 MG PO TABS
25.0000 mg | ORAL_TABLET | Freq: Two times a day (BID) | ORAL | 0 refills | Status: DC
  Filled 2022-01-08: qty 60, 30d supply, fill #0

## 2022-01-08 MED ORDER — "PEN NEEDLES 5/16"" 31G X 8 MM MISC"
1.0000 | Freq: Every day | 0 refills | Status: DC
  Filled 2022-01-08: qty 100, 30d supply, fill #0

## 2022-01-08 MED ORDER — BASAGLAR KWIKPEN 100 UNIT/ML ~~LOC~~ SOPN
40.0000 [IU] | PEN_INJECTOR | Freq: Every day | SUBCUTANEOUS | 1 refills | Status: DC
  Filled 2022-01-08: qty 12, 30d supply, fill #0

## 2022-01-08 MED ORDER — INSULIN GLARGINE-YFGN 100 UNIT/ML ~~LOC~~ SOPN
40.0000 [IU] | PEN_INJECTOR | Freq: Every day | SUBCUTANEOUS | 0 refills | Status: DC
  Filled 2022-01-08: qty 15, 37d supply, fill #0

## 2022-01-08 NOTE — Progress Notes (Signed)
Discharge instructions reviewed with pt.  Copy of instructions given to pt.  Scripts being filled by [facility] pharmacy. Part of scripts have been delivered to pt, more being filled and will be delivered to pt.  Pt's DME RW has been delivered to her room.  Pt states she is going to take an uber ride home or may be able to get a friend to come and get her.

## 2022-01-08 NOTE — Progress Notes (Addendum)
Pt discharged to home. Discharge completed by Lynelle Smoke, RN. See nurse's note for details. Pt left unit in wheelchair pushed by [facility]. Left in stable condition. Discharge meds given. Pt also received walker prior to DC

## 2022-01-08 NOTE — Progress Notes (Signed)
Pt working on getting a ride. Pt declines to go to the d/c lounge, states she has a headache and doesn't want to be in lounge around other people.  Pt's primary nurse notified of the above. Pt also waiting for the rest of scripts to be brought to her room from [place] (TOC pharm called and will be approx 20 minutes before scripts ready). Pt informed of this also.

## 2022-01-08 NOTE — TOC Transition Note (Signed)
Transition of Care [facility]) - CM/SW Discharge Note   Patient Details  Name: [name] MRN: [id number] Date of Birth: 1966/01/15  [facility] of [facility] CM/SW Contact:  Carles Collet, RN Phone Number: 01/08/2022, 7:41 AM   Clinical Narrative:     Damaris Schooner w patient over the phone re DC plans for today. Meds to be filled by [facility] pharmacy and delivered to the room. She confirmed that she would like me to get her a RW, order placed and [employer] requested to deliver it to her room prior to DC. No other TOC needs identified for DC.   Final next level of care: Home/Self Care Barriers to Discharge: No Barriers Identified   Patient Goals and CMS Choice        Discharge Placement                       Discharge Plan and Services                DME Arranged: Walker rolling DME Agency: [employer] Date DME Agency Contacted: 01/08/22 Time DME Agency Contacted: [phone] Representative spoke with at DME Agency: Brenton Grills            Social Determinants of Health ([place]) Interventions     Readmission Risk Interventions     No data to display

## 2022-01-08 NOTE — Discharge Summary (Signed)
Physician Discharge Summary  MISCHELLE REEG FGH:[id number] DOB: 1965-08-25 DOA: 01/04/2022  PCP: Mackie Pai, PA-C  Admit date: 01/04/2022 Discharge date: 01/08/2022  Admitted From: Home Disposition:  Home  Recommendations for Outpatient Follow-up:  Follow up with Ortho in 1-2 weeks Please obtain BMP/CBC in one week Follow-up with EP in 1 week check CBGs before meals and at bedtime   Home Health:No Equipment/Devices:None  Discharge Condition:Stable CODE STATUS:Full Diet recommendation: Heart Healthy   Brief/Interim Summary: 56 y.o. female past medical history of diabetes mellitus type 2 with neuropathy, gastroparesis, chronic pain comes into the hospital for nonhealing left foot ulcer which has become more painful foul-smelling over the last several days MRI on 12/24/2021 was positive for osteomyelitis orthopedic surgery was consulted   Discharge Diagnoses:  Principal Problem:   Osteomyelitis of second toe of left foot ([place]) Active Problems:   Uncontrolled type 2 diabetes mellitus with hyperglycemia, without long-term current use of insulin ([facility])   Chronic pain syndrome   Coronary artery disease   Hyperlipidemia   Hypertension   Mood disorder ([place])  Diabetic foot ulcer/Osteomyelitis of second toe of left foot ([place]) MRI positive for osteomyelitis and abscess, as an outpatient she was on Augmentin and Bactrim. Orthopedic surgery was consulted and she status post amputation of second ray left foot Infectious disease was consulted and recommended 5 days of Doxy and Augmentin postoperatively. Her pain is controlled with current management she has had a bowel movement.   Essential hypertension: She was not taking Coreg at home continue Norvasc. She can resume her Coreg as an outpatient.   Hyperlipidemia: Continue statins.   Coronary artery disease: Stress test in 2021 unremarkable.   Mood disorder: Continue BuSpar.   Uncontrolled diabetes mellitus type 2 with  hyperglycemia: A1c of 9.8, continue current home regimen of long-acting insulin plus metformin. Follow-up with PCP will probably need NovoLog with meal coverage as an outpatient. She has been instructed to check her [facility].  Discharge Instructions  Discharge Instructions     Diet - low sodium heart healthy   Complete by: As directed    Increase activity slowly   Complete by: As directed    No wound care   Complete by: As directed       Allergies as of 01/08/2022       Reactions   Oxycodone-acetaminophen Nausea Only   Insulin Aspart Swelling, Other (See Comments)   Rec'd Novolog on multiple admissions in 2023 with no reaction.   Latex Itching, Swelling, Rash   Morphine Itching, Rash        Medication List     TAKE these medications    albuterol 108 (90 Base) MCG/ACT inhaler Commonly known as: VENTOLIN HFA Inhale 2 puffs into the lungs every 6 (six) hours as needed for wheezing or shortness of breath. What changed: when to take this   amLODipine 5 MG tablet Commonly known as: NORVASC Take 1 tablet (5 mg total) by mouth daily.   amoxicillin-clavulanate 875-125 MG tablet Commonly known as: AUGMENTIN Take 1 tablet by mouth every 12 (twelve) hours for 5 days.   aspirin 81 MG tablet Take 81 mg by mouth daily.   atorvastatin 80 MG tablet Commonly known as: LIPITOR Take 1 tablet (80 mg total) by mouth daily.   busPIRone 15 MG tablet Commonly known as: BUSPAR Take 1 tablet (15 mg total) by mouth 2 (two) times daily. What changed:  how much to take when to take this reasons to take this  carvedilol 25 MG tablet Commonly known as: COREG Take 1 tablet (25 mg total) by mouth 2 (two) times daily with a meal.   doxycycline 100 MG tablet Commonly known as: VIBRA-TABS Take 1 tablet (100 mg total) by mouth every 12 (twelve) hours for 5 days.   fluticasone 50 MCG/ACT nasal spray Commonly known as: FLONASE Place 2 sprays into both nostrils daily. What changed:   how much to take when to take this reasons to take this   fluticasone furoate-vilanterol 100-25 MCG/INH Aepb Commonly known as: Breo Ellipta Inhale 1 puff into the lungs daily.   gabapentin 300 MG capsule Commonly known as: NEURONTIN Take 2 capsules (600 mg total) by mouth 3 (three) times daily.   insulin glargine-yfgn 100 UNIT/ML Pen Commonly known as: SEMGLEE Inject 40 Units into the skin at bedtime.   metFORMIN 500 MG 24 hr tablet Commonly known as: GLUCOPHAGE-XR Take 1 tablet (500 mg total) by mouth daily with breakfast.   methocarbamol 750 MG tablet Commonly known as: ROBAXIN Take 1 tablet (750 mg total) by mouth every 8 (eight) hours as needed for muscle spasms. What changed: when to take this   nitroGLYCERIN 0.4 MG SL tablet Commonly known as: NITROSTAT Place 1 tablet (0.4 mg total) under the tongue every 5 (five) minutes as needed for chest pain.   oxyCODONE 5 MG immediate release tablet Commonly known as: Oxy IR/ROXICODONE Take 1 tablet (5 mg total) by mouth every 6 (six) hours as needed for severe pain. 1 tab po bid prn severe pain What changed:  when to take this additional instructions   Oxycodone HCl 10 MG Tabs Take 1-1.5 tablets (10-15 mg total) by mouth every 4 (four) hours as needed for up to 5 days for severe pain or breakthrough pain. What changed: You were already taking a medication with the same name, and this prescription was added. Make sure you understand how and when to take each.   promethazine 12.5 MG tablet Commonly known as: PHENERGAN Take 1 tablet (12.5 mg total) by mouth every 8 (eight) hours as needed for nausea or vomiting. What changed: when to take this   topiramate 50 MG tablet Commonly known as: TOPAMAX Take 1 tablet (50 mg total) by mouth 2 (two) times daily. What changed:  when to take this reasons to take this   VITAMIN D PO Take 1 capsule by mouth daily.        Follow-up Information     [name], MD Follow up  in 1 week(s).   Specialty: Orthopedic Surgery Contact information: [place] [address] [address] [facility] up.   Why: [phone] between 8:00 a.m. - 7:00 p.m.               Allergies  Allergen Reactions   Oxycodone-Acetaminophen Nausea Only   Insulin Aspart Swelling and Other (See Comments)    Rec'd Novolog on multiple admissions in 2023 with no reaction.   Latex Itching, Swelling and Rash   Morphine Itching and Rash    Consultations: Orthopedics during   Procedures/Studies: DG Foot Complete Left  Result Date: 01/05/2022 CLINICAL DATA:  Left foot wound for several months, no known injury, initial encounter EXAM: LEFT FOOT - COMPLETE 3+ VIEW COMPARISON:  11/13/2021 FINDINGS: Soft tissue defect is noted adjacent to the base of the second proximal phalanx consistent with the known plantar wound. This does not extend to the bony structures and no  erosive changes to suggest osteomyelitis are noted. Postsurgical changes in the first metatarsal are noted distally stable from prior exams. Chronic deformity of the head of the third metatarsal is seen. Generalized soft tissue swelling is noted in the distal aspect of the foot. IMPRESSION: Soft tissue wound without evidence of osteomyelitis. Electronically Signed   By: Inez Catalina M.D.   On: 01/05/2022 00:03   DG Chest 2 View  Result Date: 01/04/2022 CLINICAL DATA:  Chest pain for several days EXAM: CHEST - 2 VIEW COMPARISON:  04/22/2021 FINDINGS: The heart size and mediastinal contours are within normal limits. Both lungs are clear. The visualized skeletal structures are unremarkable. IMPRESSION: No active cardiopulmonary disease. Electronically Signed   By: Inez Catalina M.D.   On: 01/04/2022 20:22   MR Foot Left w/o contrast  Result Date: 12/26/2021 CLINICAL DATA:  Diabetic foot ulcer for several months. Draining wound. EXAM: MRI OF THE LEFT FOOT WITHOUT CONTRAST  TECHNIQUE: Multiplanar, multisequence MR imaging of the left foot was performed. No intravenous contrast was administered. COMPARISON:  Radiographs 11/13/2021 FINDINGS: Open wound noted along the plantar aspect of the forefoot at the level of the proximal phalanx of the second toe. There is an underlying fluid collection wrapping around the plantar aspect of the second MTP joint suspicious for abscess. I do not see any definite findings for septic arthritis at the second MTP joint. There is some mild edema like signal abnormality in the proximal phalanx but no abnormal T1 signal is identified. Given the nearby open wound and probable abscess is likely osteomyelitis. Postoperative changes involving the first metatarsal head with artifact associated with 2 fixation staples. There are danced degenerative changes at the first MTP joint and mild hallux valgus deformity. No findings suspicious for septic arthritis or osteomyelitis. Chronic deformity involving the third metatarsal head may be related to prior trauma, AVN or surgery. The proximal interphalangeal joints of toes are fused. The visualized midfoot bony structures are intact. Mild degenerative changes. Diffuse subcutaneous soft tissue swelling/edema involving the dorsum of the foot suggesting cellulitis. Mild diffuse myositis without findings to suggest pyomyositis. IMPRESSION: 1. Open wound along the plantar aspect of the forefoot at the level of the proximal phalanx of the second toe. There is an underlying fluid collection wrapping around the plantar aspect of the second MTP joint suspicious for abscess. 2. Mild edema like signal abnormality in the proximal phalanx of the second toe. Given the nearby open wound and probable abscess this is likely osteomyelitis. 3. Diffuse subcutaneous soft tissue swelling/edema involving the dorsum of the foot suggesting cellulitis. Mild diffuse myositis without findings to suggest pyomyositis. 4. Postoperative changes  involving the first metatarsal head with advanced degenerative changes at the first MTP joint and mild hallux valgus deformity. No findings suspicious for septic arthritis or osteomyelitis. 5. Chronic deformity involving the third metatarsal head may be related to prior trauma, AVN or surgery. Electronically Signed   By: Marijo Sanes M.D.   On: 12/26/2021 08:18   (Echo, Carotid, EGD, Colonoscopy, ERCP)    Subjective: No complaints  Discharge Exam: Vitals:   01/08/22 0047 01/08/22 0511  BP: (!) 184/115 (!) 134/96  Pulse: (!) 112 100  Resp: 16 16  Temp: 98.6 F (37 C) 98.3 F (36.8 C)  SpO2: 100% 100%   Vitals:   01/07/22 1708 01/07/22 2125 01/08/22 0047 01/08/22 0511  BP: (!) 144/86 (!) 89/78 (!) 184/115 (!) 134/96  Pulse: (!) 109 (!) 102 (!) 112 100  Resp: 16 16  16 16  Temp: 98.7 F (37.1 C) 98.7 F (37.1 C) 98.6 F (37 C) 98.3 F (36.8 C)  TempSrc: Oral Oral Oral Oral  SpO2: 99% 99% 100% 100%  Weight:      Height:        General: Pt is alert, awake, not in acute distress Cardiovascular: RRR, S1/S2 +, no rubs, no gallops Respiratory: CTA bilaterally, no wheezing, no rhonchi Abdominal: Soft, NT, ND, bowel sounds + Extremities: no edema, no cyanosis    The results of significant diagnostics from this hospitalization (including imaging, microbiology, ancillary and laboratory) are listed below for reference.     Microbiology: Recent Results (from the past 240 hour(s))  Culture, blood (Routine X 2) w Reflex to ID Panel     Status: None (Preliminary result)   Collection Time: 01/05/22 12:47 PM   Specimen: BLOOD  Result Value Ref Range Status   Specimen Description BLOOD SITE NOT SPECIFIED  Final   Special Requests IN PEDIATRIC BOTTLE Blood Culture adequate volume  Final   Culture   Final    NO GROWTH 3 DAYS Performed at [facility], [address] [place], [place] [address]    Report Status PENDING  Incomplete  Culture, blood (Routine X 2) w Reflex to ID  Panel     Status: None (Preliminary result)   Collection Time: 01/05/22  1:08 PM   Specimen: BLOOD  Result Value Ref Range Status   Specimen Description BLOOD SITE NOT SPECIFIED  Final   Special Requests   Final    BOTTLES DRAWN AEROBIC AND ANAEROBIC Blood Culture adequate volume   Culture   Final    NO GROWTH 3 DAYS Performed at [facility], [address] [place], [place] [address]    Report Status PENDING  Incomplete  Surgical PCR screen     Status: None   Collection Time: 01/05/22  3:54 PM   Specimen: Nasal Mucosa; Nasal Swab  Result Value Ref Range Status   MRSA, PCR NEGATIVE NEGATIVE Final   Staphylococcus aureus NEGATIVE NEGATIVE Final    Comment: (NOTE) The Xpert SA Assay (FDA approved for NASAL specimens in patients 52 years of age and older), is one component of a comprehensive surveillance program. It is not intended to diagnose infection nor to guide or monitor treatment. Performed at [facility], [place] [address], [place], [place] [address]      Labs: BNP (last 3 results) No results for input(s): "BNP" in the last 8760 hours. Basic Metabolic Panel: Recent Labs  Lab 01/04/22 1933 01/06/22 0300 01/07/22 0337  NA 136 136 139  K 3.8 4.7 4.4  CL 104 103 107  CO2 '24 23 25  '$ GLUCOSE 209* 248* 303*  BUN 22* 21* 14  CREATININE 1.39* 0.98 1.10*  CALCIUM 9.1 8.6* 8.8*  MG  --   --  1.7   Liver Function Tests: Recent Labs  Lab 01/07/22 0337  AST 22  ALT 18  ALKPHOS 90  BILITOT 0.3  PROT 7.2  ALBUMIN 3.1*   No results for input(s): "LIPASE", "AMYLASE" in the last 168 hours. No results for input(s): "AMMONIA" in the last 168 hours. CBC: Recent Labs  Lab 01/04/22 1933 01/06/22 0300 01/07/22 0337  WBC 6.3 7.9 6.5  HGB 14.6 11.2* 12.2  HCT 45.4 32.4* 37.1  MCV 90.4 88.0 89.8  PLT 360 307 346   Cardiac Enzymes: No results for input(s): "CKTOTAL", "CKMB", "CKMBINDEX", "TROPONINI" in the last 168 hours. BNP:  Invalid input(s):  "POCBNP" CBG: Recent Labs  Lab 01/07/22 0726 01/07/22 1218 01/07/22 1559 01/07/22 2139 01/07/22 2217  GLUCAP 189* 119* 180* 204* 247*   D-Dimer No results for input(s): "DDIMER" in the last 72 hours. Hgb A1c No results for input(s): "HGBA1C" in the last 72 hours. Lipid Profile No results for input(s): "CHOL", "HDL", "LDLCALC", "TRIG", "CHOLHDL", "LDLDIRECT" in the last 72 hours. Thyroid function studies No results for input(s): "TSH", "T4TOTAL", "T3FREE", "THYROIDAB" in the last 72 hours.  Invalid input(s): "FREET3" Anemia work up No results for input(s): "VITAMINB12", "FOLATE", "FERRITIN", "TIBC", "IRON", "RETICCTPCT" in the last 72 hours. Urinalysis    Component Value Date/Time   COLORURINE YELLOW 01/07/2022 1601   APPEARANCEUR HAZY (A) 01/07/2022 1601   LABSPEC 1.018 01/07/2022 1601   PHURINE 5.0 01/07/2022 1601   GLUCOSEU 150 (A) 01/07/2022 1601   HGBUR NEGATIVE 01/07/2022 1601   HGBUR negative 01/08/2010 1045   BILIRUBINUR NEGATIVE 01/07/2022 1601   BILIRUBINUR moderate 07/26/2021 1519   KETONESUR NEGATIVE 01/07/2022 1601   PROTEINUR 30 (A) 01/07/2022 1601   UROBILINOGEN 0.2 07/26/2021 1519   UROBILINOGEN 1.0 11/14/2013 0741   NITRITE NEGATIVE 01/07/2022 1601   LEUKOCYTESUR TRACE (A) 01/07/2022 1601   Sepsis Labs Recent Labs  Lab 01/04/22 1933 01/06/22 0300 01/07/22 0337  WBC 6.3 7.9 6.5   Microbiology Recent Results (from the past 240 hour(s))  Culture, blood (Routine X 2) w Reflex to ID Panel     Status: None (Preliminary result)   Collection Time: 01/05/22 12:47 PM   Specimen: BLOOD  Result Value Ref Range Status   Specimen Description BLOOD SITE NOT SPECIFIED  Final   Special Requests IN PEDIATRIC BOTTLE Blood Culture adequate volume  Final   Culture   Final    NO GROWTH 3 DAYS Performed at [facility], [address], [place], [place] [address]    Report Status PENDING  Incomplete  Culture, blood (Routine X 2) w Reflex to ID Panel      Status: None (Preliminary result)   Collection Time: 01/05/22  1:08 PM   Specimen: BLOOD  Result Value Ref Range Status   Specimen Description BLOOD SITE NOT SPECIFIED  Final   Special Requests   Final    BOTTLES DRAWN AEROBIC AND ANAEROBIC Blood Culture adequate volume   Culture   Final    NO GROWTH 3 DAYS Performed at [facility], [address] [place], [place] [address]    Report Status PENDING  Incomplete  Surgical PCR screen     Status: None   Collection Time: 01/05/22  3:54 PM   Specimen: Nasal Mucosa; Nasal Swab  Result Value Ref Range Status   MRSA, PCR NEGATIVE NEGATIVE Final   Staphylococcus aureus NEGATIVE NEGATIVE Final    Comment: (NOTE) The Xpert SA Assay (FDA approved for NASAL specimens in patients 35 years of age and older), is one component of a comprehensive surveillance program. It is not intended to diagnose infection nor to guide or monitor treatment. Performed at [facility], [place] [address] [place], [place] [address]      SIGNED:   Charlynne Cousins, MD  [facility] 01/08/2022, 7:21 AM Pager   If 7PM-7AM, please contact night-coverage [email or web address] Password [email or web address]
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
## 2022-01-09 ENCOUNTER — Telehealth: Payer: Self-pay | Admitting: Orthopedic Surgery

## 2022-01-09 NOTE — Telephone Encounter (Signed)
Patient called in stating she feels more comfortable seeing [name] for her first Post op she has her 2nd toe amputated on 11/27 she needs a follow up appt next week please advise no room on schedule I did offer Junie Panning she wants to see [name]

## 2022-01-09 NOTE — Telephone Encounter (Signed)
Can you please call pt. I can open a time on Tuesday afternoon at 3:15. If she wants to take that time just let me know and I will sch. Thanks!

## 2022-01-09 NOTE — Telephone Encounter (Signed)
Scheduled

## 2022-01-09 NOTE — Telephone Encounter (Signed)
done

## 2022-01-10 LAB — CULTURE, BLOOD (ROUTINE X 2)
Culture: NO GROWTH
Culture: NO GROWTH
Special Requests: ADEQUATE
Special Requests: ADEQUATE

## 2022-01-13 ENCOUNTER — Telehealth: Payer: Self-pay | Admitting: Medical

## 2022-01-13 MED ORDER — FLUTICASONE PROPIONATE 50 MCG/ACT NA SUSP: 2.0000 | Freq: Every day | NASAL | 1 refills | Status: DC

## 2022-01-13 MED ORDER — "PEN NEEDLES 5/16"" 31G X 8 MM MISC": 1.0000 | Freq: Every day | 0 refills | Status: DC

## 2022-01-13 MED ORDER — TOPIRAMATE 50 MG PO TABS: 50.0000 mg | ORAL_TABLET | Freq: Two times a day (BID) | ORAL | 12 refills | Status: DC

## 2022-01-13 MED ORDER — BASAGLAR KWIKPEN 100 UNIT/ML ~~LOC~~ SOPN: 40.0000 [IU] | PEN_INJECTOR | Freq: Every day | SUBCUTANEOUS | 1 refills | Status: DC

## 2022-01-13 MED ORDER — METHOCARBAMOL 750 MG PO TABS: 750.0000 mg | ORAL_TABLET | Freq: Three times a day (TID) | ORAL | 0 refills | Status: DC | PRN

## 2022-01-13 MED ORDER — PROMETHAZINE HCL 12.5 MG PO TABS: 12.5000 mg | ORAL_TABLET | Freq: Three times a day (TID) | ORAL | 1 refills | Status: DC | PRN

## 2022-01-13 MED ORDER — CARVEDILOL 25 MG PO TABS: 25.0000 mg | ORAL_TABLET | Freq: Two times a day (BID) | ORAL | 0 refills | Status: DC

## 2022-01-13 MED ORDER — METFORMIN HCL ER 500 MG PO TB24: 500.0000 mg | ORAL_TABLET | Freq: Every day | ORAL | 2 refills | Status: DC

## 2022-01-13 MED ORDER — BUSPIRONE HCL 15 MG PO TABS: 15.0000 mg | ORAL_TABLET | Freq: Two times a day (BID) | ORAL | 4 refills | Status: DC

## 2022-01-13 MED ORDER — GABAPENTIN 300 MG PO CAPS: 600.0000 mg | ORAL_CAPSULE | Freq: Three times a day (TID) | ORAL | 0 refills | Status: DC

## 2022-01-13 MED ORDER — ATORVASTATIN CALCIUM 80 MG PO TABS: 80.0000 mg | ORAL_TABLET | Freq: Every day | ORAL | 3 refills | Status: DC

## 2022-01-13 MED ORDER — AMLODIPINE BESYLATE 5 MG PO TABS: 5.0000 mg | ORAL_TABLET | Freq: Every day | ORAL | 3 refills | Status: DC

## 2022-01-13 NOTE — Telephone Encounter (Signed)
Pt called stating she needed refills on all her medications sent to the mail order pharmacy for [facility]. Please Advise.

## 2022-01-13 NOTE — Telephone Encounter (Signed)
[facility]

## 2022-01-14 ENCOUNTER — Encounter: Payer: Commercial Managed Care - HMO | Admitting: Orthopedic Surgery

## 2022-01-14 ENCOUNTER — Ambulatory Visit (INDEPENDENT_AMBULATORY_CARE_PROVIDER_SITE_OTHER): Payer: Commercial Managed Care - HMO | Admitting: Orthopedic Surgery

## 2022-01-14 DIAGNOSIS — Z89422 Acquired absence of other left toe(s): Secondary | ICD-10-CM

## 2022-01-20 ENCOUNTER — Encounter: Payer: Self-pay | Admitting: Orthopedic Surgery

## 2022-01-20 NOTE — Progress Notes (Signed)
Office Visit Note   Patient: [name]           Date of Birth: May 28, 1965           MRN: [id number] Visit Date: 01/14/2022              Requested by: Mackie Pai, PA-C [place],  [place] [address] PCP: Mackie Pai, PA-C  Chief Complaint  Patient presents with   Left Foot - Routine Post Op    01/06/22 left 2nd toe amputation      HPI: Patient is a 56 year old woman who presents in follow-up status post partial second ray amputation left foot.  Assessment & Plan: Visit Diagnoses:  1. History of partial ray amputation of second toe of left foot ([place])     Plan: Recommended Dial soap cleansing nonweightbearing elevation dry dressing change daily.  Patient was given samples of 4 x 4 gauze and an Ace wrap.  Evaluate to remove sutures at follow-up.  Follow-Up Instructions: Return in about 1 week (around 01/21/2022).   Ortho Exam  Patient is alert, oriented, no adenopathy, well-dressed, normal affect, normal respiratory effort. Examination the incision is healing nicely there is no cellulitis there is increased swelling without tension on the sutures.  There is mild maceration.  Imaging: No results found.   Labs: Lab Results  Component Value Date   HGBA1C 9.8 (H) 08/26/2021   HGBA1C 12.3 (H) 04/23/2021   HGBA1C 7.9 (H) 08/24/2020   ESRSEDRATE 35 (H) 01/05/2022   ESRSEDRATE 41 (H) 08/24/2020   CRP 0.6 01/05/2022   REPTSTATUS 01/10/2022 FINAL 01/05/2022   GRAMSTAIN  05/01/2021    FEW WBC PRESENT,BOTH PMN AND MONONUCLEAR NO ORGANISMS SEEN    CULT  01/05/2022    NO GROWTH 5 DAYS Performed at [facility], [place] [address] [place], [place] [address]    LABORGA SERRATIA MARCESCENS (A) 05/21/2021     Lab Results  Component Value Date   ALBUMIN 3.1 (L) 01/07/2022   ALBUMIN 4.0 08/26/2021   ALBUMIN 3.9 05/28/2021   PREALBUMIN 23 01/05/2022    Lab Results  Component Value Date   MG 1.7 01/07/2022   MG 1.8  07/11/2017   No results found for: "VD25OH"  Lab Results  Component Value Date   PREALBUMIN 23 01/05/2022      Latest Ref Rng & Units 01/07/2022    3:37 AM 01/06/2022    3:00 AM 01/04/2022    7:33 PM  CBC EXTENDED  WBC 4.0 - 10.5 K/uL 6.5  7.9  6.3   RBC 3.87 - 5.11 MIL/uL 4.13  3.68  5.02   Hemoglobin 12.0 - 15.0 g/dL 12.2  11.2  14.6   HCT 36.0 - 46.0 % 37.1  32.4  45.4   Platelets 150 - 400 K/uL 346  307  360      There is no height or weight on file to calculate BMI.  Orders:  No orders of the defined types were placed in this encounter.  No orders of the defined types were placed in this encounter.    Procedures: No procedures performed  Clinical Data: No additional findings.  ROS:  All other systems negative, except as noted in the HPI. Review of Systems  Objective: Vital Signs: There were no vitals taken for this visit.  Specialty Comments:  No specialty comments available.  PMFS History: Patient Active Problem List   Diagnosis Date Noted   Osteomyelitis of second toe  of left foot ([place]) 01/05/2022   Mood disorder ([place]) 01/05/2022   Sepsis ([place]) 05/21/2021   Chronic diastolic HF (heart failure) ([place]) 05/21/2021   Abscess of left lower leg    Necrotizing fasciitis ([place])    Cellulitis of left leg 04/22/2021   Sepsis due to cellulitis ([place]) 04/22/2021   Severe sepsis ([place]) 04/22/2021   Acute kidney injury superimposed on chronic kidney disease ([place]) 04/22/2021   Chest pain, unspecified 04/22/2021   Uncontrolled type 2 diabetes mellitus with hyperglycemia, without long-term current use of insulin ([place]) 04/22/2021   Pain of left calf 04/19/2021   Viral upper respiratory tract infection 11/30/2020   Trigeminal neuralgia    Cervical cancer (HCC)    Chronic back pain    Coronary artery disease    GERD (gastroesophageal reflux disease)    Hyperlipidemia    Hypertension    Dyslipidemia 02/01/2019   Cardiac murmur 02/01/2019   Lumbar spinal stenosis  02/11/2018   Neuropathy 07/20/2017   Right flank pain    Nausea & vomiting 07/07/2017   Acute cystitis 07/07/2017   CAD (coronary artery disease) 07/07/2017   Metatarsalgia of both feet 03/11/2017   Diabetic gastroparesis associated with type 1 diabetes mellitus ([place]) 11/27/2016   Pure hypercholesterolemia 11/27/2016   Lumbar facet arthropathy 05/14/2016   Chronic pain syndrome 01/23/2016   Diabetic peripheral neuropathy ([place]) 01/23/2016   Anxiety 01/22/2016   History of cervical cancer 08/17/2015   Depression 08/10/2015   Overweight (BMI 25.0-29.9) 07/21/2014   Chronic kidney disease, stage III (moderate) ([place]) 05/23/2014   Diabetes type 2, uncontrolled 05/23/2014   Current use of insulin ([place]) 05/23/2014   Vitamin D deficiency 04/25/2013   Cerebrovascular disease 09/13/2012   Migraine 09/13/2012   Nonspecific abnormal findings on radiological and examination of skull and head 09/13/2012   Retention of urine 06/25/2011   ANKLE PAIN, LEFT 12/25/2009   DYSURIA 12/25/2009   VAGINITIS, CANDIDAL 12/25/2009   ULCER-GASTRIC 09/28/2009   DIABETIC PERIPHERAL NEUROPATHY 09/21/2009   TOBACCO ABUSE 09/21/2009   UTI (urinary tract infection) 09/21/2009   INSOMNIA 09/21/2009   TRANSAMINASES, SERUM, ELEVATED 09/21/2009   Elevation of level of transaminase or lactic acid dehydrogenase (LDH) 09/21/2009   Type 2 diabetes mellitus with stage 3 chronic kidney disease ([place]) 09/14/2009   Nausea with vomiting, unspecified 09/14/2009   ABDOMINAL PAIN-EPIGASTRIC 09/14/2009   Cardiomyopathy, secondary ([place]) 09/06/2009   Cardiomyopathy, unspecified ([place]) 09/06/2009   Essential hypertension 08/30/2009   DM2 (diabetes mellitus, type 2) ([place]) 07/30/2009   HLD (hyperlipidemia) 07/30/2009   GERD 07/30/2009   GASTROPARESIS 07/30/2009   CHEST PAIN, ATYPICAL 07/30/2009   POSITIVE PPD 07/30/2009   Type 2 diabetes mellitus with hyperglycemia ([place]) 07/30/2009   Gastric atony 07/30/2009   Gastroesophageal  reflux disease 07/30/2009   Hyperlipidemia, unspecified 07/30/2009   Tuberculin test reaction 07/30/2009   Past Medical History:  Diagnosis Date   Anxiety 01/22/2016   Cardiac murmur 02/01/2019   Cardiomyopathy, secondary ([place]) 09/06/2009   Qualifier: Diagnosis of  By: Arvid Right   Formatting of this note might be different from the original. Overview:  Qualifier: Diagnosis of  By: Arvid Right   Chronic kidney disease, stage III (moderate) ([place]) 05/23/2014   Chronic pain syndrome 01/23/2016   Coronary artery disease    30% lesions noted   Depression    Diabetes type 2, uncontrolled 05/23/2014   Diabetic gastroparesis associated with type 1 diabetes mellitus ([place]) 11/27/2016   DIABETIC PERIPHERAL NEUROPATHY 09/21/2009  Qualifier: Diagnosis of  By: Amil Amen MD, [place]     Essential hypertension 08/30/2009   Qualifier: Diagnosis of  By: Lovette Cliche, CNA, Christy     Gastroesophageal reflux disease 07/30/2009   Formatting of this note might be different from the original. Overview:  Overview:  Qualifier: Diagnosis of  By: Amil Amen MD, Elby Showers: Diagnosis of  By: Chester Holstein NP, [place] Handler of this note might be different from the original. Overview:  Qualifier: Diagnosis of  By: Amil Amen MD, Elby Showers: Diagnosis of  By: Chester Holstein NP, Nevin Bloodgood   GERD (gastroesophageal reflux disease)    History of cervical cancer 08/17/2015   Status post partial hysterectomy  Formatting of this note might be different from the original. Status post partial hysterectomy   Hyperlipidemia    INSOMNIA 09/21/2009   Qualifier: Diagnosis of  By: Amil Amen MD, [name]     Lumbar facet arthropathy 05/14/2016   Lumbar spinal stenosis 02/11/2018   Metatarsalgia of both feet 03/11/2017   Migraine 09/13/2012   Overview:  IMPRESSION: possible abd migraine   TOBACCO ABUSE 09/21/2009   Qualifier: Diagnosis of  By: Amil Amen MD, Winona Legato of this  note might be different from the original. Overview:  Qualifier: Diagnosis of  By: Amil Amen MD, [name]   Trigeminal neuralgia    ULCER-GASTRIC 09/28/2009   Qualifier: Diagnosis of  By: Chester Holstein NP, Lyn Records, CANDIDAL 12/25/2009   Qualifier: Diagnosis of  By: Amil Amen MD, Tessa Lerner D deficiency 04/25/2013    Family History  Problem Relation Age of Onset   Diabetes Mother    Diabetes Father     Past Surgical History:  Procedure Laterality Date   ABDOMINAL HYSTERECTOMY     partial   AMPUTATION Left 01/06/2022   Procedure: AMPUTATION 2nd TOE;  Surgeon: [name], MD;  Location: [facility];  Service: Orthopedics;  Laterality: Left;   CHOLECYSTECTOMY     I & D EXTREMITY Left 04/24/2021   Procedure: LEFT LEG DEBRIDEMENT;  Surgeon: [name], MD;  Location: [facility];  Service: Orthopedics;  Laterality: Left;   I & D EXTREMITY Left 05/01/2021   Procedure: LEFT KNEE DEBRIDEMENT;  Surgeon: [name], MD;  Location: [facility];  Service: Orthopedics;  Laterality: Left;   Social History   Occupational History   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

## 2022-01-22 ENCOUNTER — Telehealth: Payer: Self-pay | Admitting: Medical

## 2022-01-22 MED ORDER — METFORMIN HCL ER 500 MG PO TB24: 500.0000 mg | ORAL_TABLET | Freq: Every day | ORAL | 2 refills | Status: DC

## 2022-01-22 MED ORDER — METHOCARBAMOL 750 MG PO TABS: 750.0000 mg | ORAL_TABLET | Freq: Three times a day (TID) | ORAL | 0 refills | Status: DC | PRN

## 2022-01-22 MED ORDER — GABAPENTIN 300 MG PO CAPS: 600.0000 mg | ORAL_CAPSULE | Freq: Three times a day (TID) | ORAL | 0 refills | Status: DC

## 2022-01-22 NOTE — Addendum Note (Signed)
Addended by: Jeronimo Greaves on: 01/22/2022 09:40 AM   Modules accepted: Orders

## 2022-01-22 NOTE — Telephone Encounter (Signed)
Rx sent 

## 2022-01-22 NOTE — Telephone Encounter (Signed)
Pt called stating that [facility] is having issues with getting her prescriptions delivered to her so she is wanting to reroute them to the following pharmacy:  Prescription Request  01/22/2022  Is this a "Controlled Substance" medicine? No  LOV: 08/21/2021  What is the name of the medication or equipment?   methocarbamol (ROBAXIN) 750 MG tablet [[id number]]   gabapentin (NEURONTIN) 300 MG capsule [[id number]]   metFORMIN (GLUCOPHAGE-XR) 500 MG 24 hr tablet [[id number]]   Have you contacted your pharmacy to request a refill? Yes   Which pharmacy would you like this sent to?   [facility], [address]. [place]. [place] [address] Phone: [phone] Fax: [phone]  Patient notified that their request is being sent to the clinical staff for review and that they should receive a response within 2 business days.   Please advise at Mobile [phone] (mobile)

## 2022-01-23 ENCOUNTER — Telehealth: Payer: Self-pay | Admitting: Orthopedic Surgery

## 2022-01-23 ENCOUNTER — Ambulatory Visit (INDEPENDENT_AMBULATORY_CARE_PROVIDER_SITE_OTHER): Payer: Commercial Managed Care - HMO | Admitting: Orthopedic Surgery

## 2022-01-23 ENCOUNTER — Encounter: Payer: Self-pay | Admitting: Orthopedic Surgery

## 2022-01-23 DIAGNOSIS — Z89422 Acquired absence of other left toe(s): Secondary | ICD-10-CM

## 2022-01-23 MED ORDER — OXYCODONE HCL 5 MG PO TABS: 5.0000 mg | ORAL_TABLET | Freq: Four times a day (QID) | ORAL | 0 refills | Status: DC | PRN

## 2022-01-23 MED ORDER — OXYCODONE HCL 10 MG PO TABS: 10.0000 mg | ORAL_TABLET | Freq: Four times a day (QID) | ORAL | 0 refills | Status: DC | PRN

## 2022-01-23 MED ORDER — MUPIROCIN 2 % EX OINT: 1.0000 | TOPICAL_OINTMENT | Freq: Two times a day (BID) | CUTANEOUS | 3 refills | Status: DC

## 2022-01-23 NOTE — Progress Notes (Addendum)
Office Visit Note   Patient: [name]           Date of Birth: April 06, 1965           MRN: [id number] Visit Date: 01/23/2022              Requested by: Mackie Pai, PA-C [place],  [place] [address] PCP: Mackie Pai, PA-C  Chief Complaint  Patient presents with   Left Foot - Routine Post Op    01/06/2022 left 2nd toe amputation       HPI: Patient is a 56 year old woman who is 3 weeks status post left foot second ray amputation.  Patient complains of swelling.  Assessment & Plan: Visit Diagnoses:  1. History of partial ray amputation of second toe of left foot ([place])     Plan: We will harvest the sutures today apply Steri-Strips.  She will advance to sneakers as she feels comfortable.  Follow-Up Instructions: Return in about 4 weeks (around 02/20/2022).   Ortho Exam  Patient is alert, oriented, no adenopathy, well-dressed, normal affect, normal respiratory effort. Examination there is swelling no cellulitis or drainage.  The wound edges are well-approximated.  Sutures harvested today.  Imaging: No results found. No images are attached to the encounter.  Labs: Lab Results  Component Value Date   HGBA1C 9.8 (H) 08/26/2021   HGBA1C 12.3 (H) 04/23/2021   HGBA1C 7.9 (H) 08/24/2020   ESRSEDRATE 35 (H) 01/05/2022   ESRSEDRATE 41 (H) 08/24/2020   CRP 0.6 01/05/2022   REPTSTATUS 01/10/2022 FINAL 01/05/2022   GRAMSTAIN  05/01/2021    FEW WBC PRESENT,BOTH PMN AND MONONUCLEAR NO ORGANISMS SEEN    CULT  01/05/2022    NO GROWTH 5 DAYS Performed at [facility], [place] [address], [place], [place] [address]    LABORGA SERRATIA MARCESCENS (A) 05/21/2021     Lab Results  Component Value Date   ALBUMIN 3.1 (L) 01/07/2022   ALBUMIN 4.0 08/26/2021   ALBUMIN 3.9 05/28/2021   PREALBUMIN 23 01/05/2022    Lab Results  Component Value Date   MG 1.7 01/07/2022   MG 1.8 07/11/2017   No results found for: "VD25OH"  Lab  Results  Component Value Date   PREALBUMIN 23 01/05/2022      Latest Ref Rng & Units 01/07/2022    3:37 AM 01/06/2022    3:00 AM 01/04/2022    7:33 PM  CBC EXTENDED  WBC 4.0 - 10.5 K/uL 6.5  7.9  6.3   RBC 3.87 - 5.11 MIL/uL 4.13  3.68  5.02   Hemoglobin 12.0 - 15.0 g/dL 12.2  11.2  14.6   HCT 36.0 - 46.0 % 37.1  32.4  45.4   Platelets 150 - 400 K/uL 346  307  360      There is no height or weight on file to calculate BMI.  Orders:  No orders of the defined types were placed in this encounter.  Meds ordered this encounter  Medications   oxyCODONE (OXY IR/ROXICODONE) 5 MG immediate release tablet    Sig: Take 1 tablet (5 mg total) by mouth every 6 (six) hours as needed for severe pain. 1 tab po bid prn severe pain    Dispense:  28 tablet    Refill:  0   DISCONTD: oxyCODONE 10 MG TABS    Sig: Take 1 tablet (10 mg total) by mouth every 6 (six) hours as needed for severe pain.  Dispense:  30 tablet    Refill:  0   Oxycodone HCl 10 MG TABS    Sig: Take 1 tablet (10 mg total) by mouth every 6 (six) hours as needed.    Dispense:  30 tablet    Refill:  0     Procedures: No procedures performed  Clinical Data: No additional findings.  ROS:  All other systems negative, except as noted in the HPI. Review of Systems  Objective: Vital Signs: There were no vitals taken for this visit.  Specialty Comments:  No specialty comments available.  PMFS History: Patient Active Problem List   Diagnosis Date Noted   Osteomyelitis of second toe of left foot ([place]) 01/05/2022   Mood disorder ([place]) 01/05/2022   Sepsis ([place]) 05/21/2021   Chronic diastolic HF (heart failure) ([place]) 05/21/2021   Abscess of left lower leg    Necrotizing fasciitis ([place])    Cellulitis of left leg 04/22/2021   Sepsis due to cellulitis ([place]) 04/22/2021   Severe sepsis ([place]) 04/22/2021   Acute kidney injury superimposed on chronic kidney disease ([place]) 04/22/2021   Chest pain, unspecified 04/22/2021    Uncontrolled type 2 diabetes mellitus with hyperglycemia, without long-term current use of insulin ([place]) 04/22/2021   Pain of left calf 04/19/2021   Viral upper respiratory tract infection 11/30/2020   Trigeminal neuralgia    Cervical cancer (HCC)    Chronic back pain    Coronary artery disease    GERD (gastroesophageal reflux disease)    Hyperlipidemia    Hypertension    Dyslipidemia 02/01/2019   Cardiac murmur 02/01/2019   Lumbar spinal stenosis 02/11/2018   Neuropathy 07/20/2017   Right flank pain    Nausea & vomiting 07/07/2017   Acute cystitis 07/07/2017   CAD (coronary artery disease) 07/07/2017   Metatarsalgia of both feet 03/11/2017   Diabetic gastroparesis associated with type 1 diabetes mellitus ([place]) 11/27/2016   Pure hypercholesterolemia 11/27/2016   Lumbar facet arthropathy 05/14/2016   Chronic pain syndrome 01/23/2016   Diabetic peripheral neuropathy ([place]) 01/23/2016   Anxiety 01/22/2016   History of cervical cancer 08/17/2015   Depression 08/10/2015   Overweight (BMI 25.0-29.9) 07/21/2014   Chronic kidney disease, stage III (moderate) ([place]) 05/23/2014   Diabetes type 2, uncontrolled 05/23/2014   Current use of insulin ([place]) 05/23/2014   Vitamin D deficiency 04/25/2013   Cerebrovascular disease 09/13/2012   Migraine 09/13/2012   Nonspecific abnormal findings on radiological and examination of skull and head 09/13/2012   Retention of urine 06/25/2011   ANKLE PAIN, LEFT 12/25/2009   DYSURIA 12/25/2009   VAGINITIS, CANDIDAL 12/25/2009   ULCER-GASTRIC 09/28/2009   DIABETIC PERIPHERAL NEUROPATHY 09/21/2009   TOBACCO ABUSE 09/21/2009   UTI (urinary tract infection) 09/21/2009   INSOMNIA 09/21/2009   TRANSAMINASES, SERUM, ELEVATED 09/21/2009   Elevation of level of transaminase or lactic acid dehydrogenase (LDH) 09/21/2009   Type 2 diabetes mellitus with stage 3 chronic kidney disease ([place]) 09/14/2009   Nausea with vomiting, unspecified 09/14/2009   ABDOMINAL  PAIN-EPIGASTRIC 09/14/2009   Cardiomyopathy, secondary ([place]) 09/06/2009   Cardiomyopathy, unspecified ([place]) 09/06/2009   Essential hypertension 08/30/2009   DM2 (diabetes mellitus, type 2) ([place]) 07/30/2009   HLD (hyperlipidemia) 07/30/2009   GERD 07/30/2009   GASTROPARESIS 07/30/2009   CHEST PAIN, ATYPICAL 07/30/2009   POSITIVE PPD 07/30/2009   Type 2 diabetes mellitus with hyperglycemia ([place]) 07/30/2009   Gastric atony 07/30/2009   Gastroesophageal reflux disease 07/30/2009   Hyperlipidemia, unspecified 07/30/2009   Tuberculin test reaction  07/30/2009   Past Medical History:  Diagnosis Date   Anxiety 01/22/2016   Cardiac murmur 02/01/2019   Cardiomyopathy, secondary ([place]) 09/06/2009   Qualifier: Diagnosis of  By: Arvid Right   Formatting of this note might be different from the original. Overview:  Qualifier: Diagnosis of  By: Arvid Right   Chronic kidney disease, stage III (moderate) ([place]) 05/23/2014   Chronic pain syndrome 01/23/2016   Coronary artery disease    30% lesions noted   Depression    Diabetes type 2, uncontrolled 05/23/2014   Diabetic gastroparesis associated with type 1 diabetes mellitus ([place]) 11/27/2016   DIABETIC PERIPHERAL NEUROPATHY 09/21/2009   Qualifier: Diagnosis of  By: Amil Amen MD, [place]     Essential hypertension 08/30/2009   Qualifier: Diagnosis of  By: Lovette Cliche, CNA, Christy     Gastroesophageal reflux disease 07/30/2009   Formatting of this note might be different from the original. Overview:  Overview:  Qualifier: Diagnosis of  By: Amil Amen MD, Elby Showers: Diagnosis of  By: Chester Holstein NP, [place] Handler of this note might be different from the original. Overview:  Qualifier: Diagnosis of  By: Amil Amen MD, Elby Showers: Diagnosis of  By: Chester Holstein NP, Nevin Bloodgood   GERD (gastroesophageal reflux disease)    History of cervical cancer 08/17/2015   Status post partial hysterectomy  Formatting of this note  might be different from the original. Status post partial hysterectomy   Hyperlipidemia    INSOMNIA 09/21/2009   Qualifier: Diagnosis of  By: Amil Amen MD, [name]     Lumbar facet arthropathy 05/14/2016   Lumbar spinal stenosis 02/11/2018   Metatarsalgia of both feet 03/11/2017   Migraine 09/13/2012   Overview:  IMPRESSION: possible abd migraine   TOBACCO ABUSE 09/21/2009   Qualifier: Diagnosis of  By: Amil Amen MD, Winona Legato of this note might be different from the original. Overview:  Qualifier: Diagnosis of  By: Amil Amen MD, [name]   Trigeminal neuralgia    ULCER-GASTRIC 09/28/2009   Qualifier: Diagnosis of  By: Chester Holstein NP, Lyn Records, CANDIDAL 12/25/2009   Qualifier: Diagnosis of  By: Amil Amen MD, Tessa Lerner D deficiency 04/25/2013    Family History  Problem Relation Age of Onset   Diabetes Mother    Diabetes Father     Past Surgical History:  Procedure Laterality Date   ABDOMINAL HYSTERECTOMY     partial   AMPUTATION Left 01/06/2022   Procedure: AMPUTATION 2nd TOE;  Surgeon: [name], MD;  Location: [facility];  Service: Orthopedics;  Laterality: Left;   CHOLECYSTECTOMY     I & D EXTREMITY Left 04/24/2021   Procedure: LEFT LEG DEBRIDEMENT;  Surgeon: [name], MD;  Location: [facility];  Service: Orthopedics;  Laterality: Left;   I & D EXTREMITY Left 05/01/2021   Procedure: LEFT KNEE DEBRIDEMENT;  Surgeon: [name], MD;  Location: [facility];  Service: Orthopedics;  Laterality: Left;   Social History   Occupational History   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

## 2022-01-23 NOTE — Addendum Note (Signed)
Addended by: Meridee Score on: 01/23/2022 05:27 PM   Modules accepted: Orders

## 2022-01-23 NOTE — Telephone Encounter (Signed)
Pt was in the office today s/p left foot 2nd toe amp 01/06/2022 Called pt and advised that she has only received the Oxycodone 5 mg since surgery. Pt would like to have 10 mg called to pharm and also an ABX oint for her incision. Please advise.

## 2022-01-23 NOTE — Addendum Note (Signed)
Addended by: Meridee Score on: 01/23/2022 05:45 PM   Modules accepted: Orders

## 2022-01-23 NOTE — Telephone Encounter (Signed)
Pt called in stating that Dr. Sharol Given prescribe her medication (oxyCODONE (OXY IR/ROXICODONE) 10 MG immediate release tablet)... Pt stated that she is at the pharmacy and the pharmacy advised her that she only have the medication (oxyCODONE (OXY IR/ROXICODONE) 5 MG immediate release tablet)... Pt stated that she been on the '10mg'$  ever since she left the hospital... Pt requesting for '10mg'$  oxycodone.... Pt requesting callback.Marland KitchenMarland Kitchen

## 2022-01-28 ENCOUNTER — Telehealth: Payer: Self-pay | Admitting: Orthopedic Surgery

## 2022-01-28 NOTE — Telephone Encounter (Signed)
Mychart message sent to pt to see if she can come in tomorrow afternoon.

## 2022-01-28 NOTE — Telephone Encounter (Signed)
Can you please call pt and see if she would like to come in and see Junie Panning tomorrow? If she is concerned we would like to see her in the office. Let me knwo and I can open a time if you need. Thanks!

## 2022-01-28 NOTE — Telephone Encounter (Signed)
Patient is concerned about wound being still opened  [phone]

## 2022-01-29 ENCOUNTER — Ambulatory Visit (INDEPENDENT_AMBULATORY_CARE_PROVIDER_SITE_OTHER): Payer: Commercial Managed Care - HMO | Admitting: Family

## 2022-01-29 ENCOUNTER — Encounter: Payer: Self-pay | Admitting: Family

## 2022-01-29 ENCOUNTER — Telehealth: Payer: Self-pay | Admitting: Family

## 2022-01-29 DIAGNOSIS — Z89422 Acquired absence of other left toe(s): Secondary | ICD-10-CM

## 2022-01-29 NOTE — Telephone Encounter (Signed)
Pt is on her way in now. I guess you can open appt time for 10:30 am. Thanks

## 2022-01-29 NOTE — Telephone Encounter (Signed)
Appt made

## 2022-01-29 NOTE — Progress Notes (Signed)
Post-Op Visit Note   Patient: [name]           Date of Birth: [birth year]           MRN: [id number] Visit Date: 01/29/2022 PCP: Mackie Pai, PA-C  Chief Complaint: No chief complaint on file.   HPI:  HPI The patient is a 56 year old woman seen status post left second ray amputation she is concerned that her incision is opening up Ortho Exam On examination of the left foot there is thickened hyperkeratotic tissue to the plantar aspect of her foot appears to be a fissure after debridement with a 10 blade knife back to viable tissue there is no underlying open area this is well-healed.  She has 1 remaining open area along her incision dorsally this is 15 mm in length 3 mm in width filled in with granulation there is no active drainage no erythema no sign of infection  Visit Diagnoses: No diagnosis found.  Plan: Continue daily Dial soap cleansing may use Neosporin to the open area.  Given a note to return to work for seated light duty plan to reevaluate at follow-up  Follow-Up Instructions: No follow-ups on file.   Imaging: No results found.  Orders:  No orders of the defined types were placed in this encounter.  No orders of the defined types were placed in this encounter.    PMFS History: Patient Active Problem List   Diagnosis Date Noted   Osteomyelitis of second toe of left foot ([place]) 01/05/2022   Mood disorder ([place]) 01/05/2022   Sepsis ([place]) 05/21/2021   Chronic diastolic HF (heart failure) ([place]) 05/21/2021   Abscess of left lower leg    Necrotizing fasciitis ([place])    Cellulitis of left leg 04/22/2021   Sepsis due to cellulitis ([place]) 04/22/2021   Severe sepsis ([place]) 04/22/2021   Acute kidney injury superimposed on chronic kidney disease ([place]) 04/22/2021   Chest pain, unspecified 04/22/2021   Uncontrolled type 2 diabetes mellitus with hyperglycemia, without long-term current use of insulin ([place]) 04/22/2021   Pain of left calf 04/19/2021   Viral upper  respiratory tract infection 11/30/2020   Trigeminal neuralgia    Cervical cancer (HCC)    Chronic back pain    Coronary artery disease    GERD (gastroesophageal reflux disease)    Hyperlipidemia    Hypertension    Dyslipidemia 02/01/2019   Cardiac murmur 02/01/2019   Lumbar spinal stenosis 02/11/2018   Neuropathy 07/20/2017   Right flank pain    Nausea & vomiting 07/07/2017   Acute cystitis 07/07/2017   CAD (coronary artery disease) 07/07/2017   Metatarsalgia of both feet 03/11/2017   Diabetic gastroparesis associated with type 1 diabetes mellitus ([place]) 11/27/2016   Pure hypercholesterolemia 11/27/2016   Lumbar facet arthropathy 05/14/2016   Chronic pain syndrome 01/23/2016   Diabetic peripheral neuropathy ([place]) 01/23/2016   Anxiety 01/22/2016   History of cervical cancer 08/17/2015   Depression 08/10/2015   Overweight (BMI 25.0-29.9) 07/21/2014   Chronic kidney disease, stage III (moderate) ([place]) 05/23/2014   Diabetes type 2, uncontrolled 05/23/2014   Current use of insulin ([place]) 05/23/2014   Vitamin D deficiency 04/25/2013   Cerebrovascular disease 09/13/2012   Migraine 09/13/2012   Nonspecific abnormal findings on radiological and examination of skull and head 09/13/2012   Retention of urine 06/25/2011   ANKLE PAIN, LEFT 12/25/2009   DYSURIA 12/25/2009   VAGINITIS, CANDIDAL 12/25/2009   ULCER-GASTRIC 09/28/2009   DIABETIC PERIPHERAL NEUROPATHY 09/21/2009   TOBACCO ABUSE  09/21/2009   UTI (urinary tract infection) 09/21/2009   INSOMNIA 09/21/2009   TRANSAMINASES, SERUM, ELEVATED 09/21/2009   Elevation of level of transaminase or lactic acid dehydrogenase (LDH) 09/21/2009   Type 2 diabetes mellitus with stage 3 chronic kidney disease ([place]) 09/14/2009   Nausea with vomiting, unspecified 09/14/2009   ABDOMINAL PAIN-EPIGASTRIC 09/14/2009   Cardiomyopathy, secondary ([place]) 09/06/2009   Cardiomyopathy, unspecified ([place]) 09/06/2009   Essential hypertension 08/30/2009   DM2  (diabetes mellitus, type 2) ([place]) 07/30/2009   HLD (hyperlipidemia) 07/30/2009   GERD 07/30/2009   GASTROPARESIS 07/30/2009   CHEST PAIN, ATYPICAL 07/30/2009   POSITIVE PPD 07/30/2009   Type 2 diabetes mellitus with hyperglycemia ([place]) 07/30/2009   Gastric atony 07/30/2009   Gastroesophageal reflux disease 07/30/2009   Hyperlipidemia, unspecified 07/30/2009   Tuberculin test reaction 07/30/2009   Past Medical History:  Diagnosis Date   Anxiety 01/22/2016   Cardiac murmur 02/01/2019   Cardiomyopathy, secondary ([place]) 09/06/2009   Qualifier: Diagnosis of  By: Arvid Right   Formatting of this note might be different from the original. Overview:  Qualifier: Diagnosis of  By: Arvid Right   Chronic kidney disease, stage III (moderate) ([place]) 05/23/2014   Chronic pain syndrome 01/23/2016   Coronary artery disease    30% lesions noted   Depression    Diabetes type 2, uncontrolled 05/23/2014   Diabetic gastroparesis associated with type 1 diabetes mellitus ([place]) 11/27/2016   DIABETIC PERIPHERAL NEUROPATHY 09/21/2009   Qualifier: Diagnosis of  By: Amil Amen MD, [place]     Essential hypertension 08/30/2009   Qualifier: Diagnosis of  By: Lovette Cliche, CNA, Christy     Gastroesophageal reflux disease 07/30/2009   Formatting of this note might be different from the original. Overview:  Overview:  Qualifier: Diagnosis of  By: Amil Amen MD, Elby Showers: Diagnosis of  By: Chester Holstein NP, Ellwood Handler of this note might be different from the original. Overview:  Qualifier: Diagnosis of  By: Amil Amen MD, Elby Showers: Diagnosis of  By: Chester Holstein NP, Nevin Bloodgood   GERD (gastroesophageal reflux disease)    History of cervical cancer 08/17/2015   Status post partial hysterectomy  Formatting of this note might be different from the original. Status post partial hysterectomy   Hyperlipidemia    INSOMNIA 09/21/2009   Qualifier: Diagnosis of  By: Amil Amen MD,  [name]     Lumbar facet arthropathy 05/14/2016   Lumbar spinal stenosis 02/11/2018   Metatarsalgia of both feet 03/11/2017   Migraine 09/13/2012   Overview:  IMPRESSION: possible abd migraine   TOBACCO ABUSE 09/21/2009   Qualifier: Diagnosis of  By: Amil Amen MD, Winona Legato of this note might be different from the original. Overview:  Qualifier: Diagnosis of  By: Amil Amen MD, [name]   Trigeminal neuralgia    ULCER-GASTRIC 09/28/2009   Qualifier: Diagnosis of  By: Chester Holstein NP, Lyn Records, CANDIDAL 12/25/2009   Qualifier: Diagnosis of  By: Amil Amen MD, Tessa Lerner D deficiency 04/25/2013    Family History  Problem Relation Age of Onset   Diabetes Mother    Diabetes Father     Past Surgical History:  Procedure Laterality Date   ABDOMINAL HYSTERECTOMY     partial   AMPUTATION Left 01/06/2022   Procedure: AMPUTATION 2nd TOE;  Surgeon: [name], MD;  Location: [facility];  Service: Orthopedics;  Laterality: Left;   CHOLECYSTECTOMY     I & D EXTREMITY Left  04/24/2021   Procedure: LEFT LEG DEBRIDEMENT;  Surgeon: [name], MD;  Location: [facility];  Service: Orthopedics;  Laterality: Left;   I & D EXTREMITY Left 05/01/2021   Procedure: LEFT KNEE DEBRIDEMENT;  Surgeon: [name], MD;  Location: [facility];  Service: Orthopedics;  Laterality: Left;   Social History   Occupational History   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

## 2022-01-30 ENCOUNTER — Encounter: Payer: Self-pay | Admitting: Family Medicine

## 2022-01-30 ENCOUNTER — Other Ambulatory Visit (HOSPITAL_COMMUNITY)
Admission: RE | Admit: 2022-01-30 | Discharge: 2022-01-30 | Disposition: A | Discharge status: Home / Self Care | Payer: Medicaid Other | Source: Ambulatory Visit | Attending: Family Medicine | Admitting: Family Medicine

## 2022-01-30 ENCOUNTER — Ambulatory Visit (INDEPENDENT_AMBULATORY_CARE_PROVIDER_SITE_OTHER): Payer: Medicaid Other | Admitting: Family Medicine

## 2022-01-30 VITALS — BP 132/90 | HR 88 | Temp 98.2°F | Resp 16 | Ht 72.0 in | Wt 187.8 lb

## 2022-01-30 DIAGNOSIS — Z113 Encounter for screening for infections with a predominantly sexual mode of transmission: Secondary | ICD-10-CM

## 2022-01-30 DIAGNOSIS — R8290 Unspecified abnormal findings in urine: Secondary | ICD-10-CM | POA: Diagnosis not present

## 2022-01-30 DIAGNOSIS — R5381 Other malaise: Secondary | ICD-10-CM

## 2022-01-30 DIAGNOSIS — R079 Chest pain, unspecified: Secondary | ICD-10-CM | POA: Diagnosis not present

## 2022-01-30 DIAGNOSIS — N898 Other specified noninflammatory disorders of vagina: Secondary | ICD-10-CM | POA: Diagnosis present

## 2022-01-30 LAB — POCT URINALYSIS DIPSTICK
Bilirubin, UA: NEGATIVE
Blood, UA: NEGATIVE
Glucose, UA: NEGATIVE
Ketones, UA: NEGATIVE
Leukocytes, UA: NEGATIVE
Nitrite, UA: POSITIVE — AB
Protein, UA: NEGATIVE
Spec Grav, UA: 1.015 (ref 1.010–1.025)
Urobilinogen, UA: 0.2 E.U./dL
pH, UA: 7 (ref 5.0–8.0)

## 2022-01-30 NOTE — Progress Notes (Signed)
Acute Office Visit  Subjective:     Patient ID: [name], female    DOB: 16-Dec-1965, 56 y.o.   MRN: [id number]  CC: urinary symptoms, chest pain   HPI  Urinary Tract Infection: Patient complains of abnormal smelling urine and nausea, trouble emptying bladder, increased frequency, suprapubic pressure, thin/yellow vaginal discharge. She has had symptoms for 3 days. Patient denies back pain, fever, rhinitis, and sorethroat. Patient does not have a history of recurrent UTI.  She is sexually active, no new partners, no concern for STDs but would like screening.   Last night she was having left sided chest pains bad enough to take a SL nitroglycerin. 7/10 mid chest pressure lasting "for a pretty good bit." Right hand started feeling tingly at that time. Nitro did relieve the pain. She has just felt "off" and nauseous today, but no severe pain like last night. No difficulty breathing, sweating, clammy, lightheaded. Reports the episode started after getting stressed after a conversation and stress over her health, etc.  Currently patient denies any chest pain, palpitations, dyspnea, wheezing, edema, recurrent headaches, vision changes.      ROS All review of systems negative except what is listed in the HPI      Objective:    BP (!) 132/90   Pulse 88   Temp 98.2 F (36.8 C)   Resp 16   Ht 6' (1.829 m)   Wt 187 lb 12.8 oz (85.2 kg)   SpO2 100%   BMI 25.47 kg/m    Physical Exam Vitals reviewed.  Constitutional:      Appearance: Normal appearance.  Cardiovascular:     Rate and Rhythm: Normal rate and regular rhythm.     Pulses: Normal pulses.     Heart sounds: Normal heart sounds.  Pulmonary:     Effort: Pulmonary effort is normal.     Breath sounds: Normal breath sounds.  Skin:    General: Skin is warm and dry.  Neurological:     Mental Status: She is alert and oriented to person, place, and time.  Psychiatric:        Mood and Affect: Mood normal.         Behavior: Behavior normal.        Thought Content: Thought content normal.        Judgment: Judgment normal.        Results for orders placed or performed in visit on 01/30/22  POC Urinalysis Dipstick  Result Value Ref Range   Color, UA     Clarity, UA     Glucose, UA Negative Negative   Bilirubin, UA Negative    Ketones, UA Negative    Spec Grav, UA 1.015 1.010 - 1.025   Blood, UA Negative    pH, UA 7.0 5.0 - 8.0   Protein, UA Negative Negative   Urobilinogen, UA 0.2 0.2 or 1.0 E.U./dL   Nitrite, UA Positive (A)    Leukocytes, UA Negative Negative   Appearance     Odor          Assessment & Plan:   Problem List Items Addressed This Visit       Other   Chest pain, unspecified - Primary Normal EKG Adding labs Consider anxiety, stress, reflux, etc. If chest pains return, please go to the ED. Consider cardiology referral if recurrent  EKG interpretation: Rate: 84 Rhythm: SR No ST/T changes concerning for acute ischemia/infarct  Previous EKG: NSR on 01/05/22     Relevant  Orders   EKG 12-Lead   Troponin I (High Sensitivity)   CBC   Basic Metabolic Panel (BMET)   Other Visit Diagnoses     Vaginal discharge     Screening swab today    Relevant Orders   Cervicovaginal ancillary only   Screen for STD (sexually transmitted disease)       Relevant Orders   Cervicovaginal ancillary only   Malaise     Negative COVID Normal EKG Labs today  Rest, hydrated, monitor for any new or worsening symptoms Recommend PCP f/u if symptoms persist     Relevant Orders   EKG 12-Lead COVID   Abnormal urine odor     UA with nitrites, otherwise normal. Will send for culture. Increase hydration,         No orders of the defined types were placed in this encounter.     Return in about 2 weeks (around 02/13/2022) for Follow for BP in 2 Weeks.  Terrilyn Saver, NP

## 2022-01-30 NOTE — Patient Instructions (Signed)
EKG is normal COVID is negative Sending urine off for culture to further evaluate for possible infection - we will let you know results and any changes to the plan  There are many viruses going around so please stay safe and healthy. Symptoms management listed below. If you develop any more chest pains, please go to the emergency department.  Recommend routine follow-up with PCP in the near future    The following information is provided as a general resource for ADULT patients only and does NOT take into account PREGNANCY, ALLERGIES, LIVER CONDITIONS, KIDNEY CONDITIONS, GASTROINTESTINAL CONDITIONS, OR PRESCRIPTION MEDICATION INTERACTIONS. Please be sure to ask your provider if the following are safe to take with your specific medical history, conditions, or current medication regimen if you are unsure.   Adult Basic Symptom Management for Sinusitis  Congestion: Guaifenesin (Mucinex)- follow directions on packaging with a maximum dose of '2400mg'$  in a 24 hour period.  Pain/Fever: Ibuprofen '200mg'$  - '400mg'$  every 4-6 hours as needed (MAX '1200mg'$  in a 24 hour period) Pain/Fever: Tylenol '500mg'$  -'1000mg'$  every 6-8 hours as needed (MAX '3000mg'$  in a 24 hour period)  Cough: Dextromethorphan (Delsym)- follow directions on packing with a maximum dose of '120mg'$  in a 24 hour period.  Nasal Stuffiness: Saline nasal spray and/or Nettie Pot with sterile saline solution  Runny Nose: Fluticasone nasal spray (Flonase) OR Mometasone nasal spray (Nasonex) OR Triamcinolone Acetonide nasal spray (Nasacort)- follow directions on the packaging  Pain/Pressure: Warm washcloth to the face  Sore Throat: Warm salt water gargles  If you have allergies, you may also consider taking an oral antihistamine (like Zyrtec or Claritin) as these may also help with your symptoms.  **Many medications will have more than one ingredient, be sure you are reading the packaging carefully and not taking more than one dose of the same kind of  medication at the same time or too close together. It is OK to use formulas that have all of the ingredients you want, but do not take them in a combined medication and as separate dose too close together. If you have any questions, the pharmacist will be happy to help you decide what is safe.

## 2022-01-31 LAB — BASIC METABOLIC PANEL
BUN: 16 mg/dL (ref 6–23)
CO2: 30 mEq/L (ref 19–32)
Calcium: 9.3 mg/dL (ref 8.4–10.5)
Chloride: 103 mEq/L (ref 96–112)
Creatinine, Ser: 1.09 mg/dL (ref 0.40–1.20)
GFR: 56.55 mL/min — ABNORMAL LOW (ref >60.00)
Glucose, Bld: 60 mg/dL — ABNORMAL LOW (ref 70–99)
Potassium: 4.2 mEq/L (ref 3.5–5.1)
Sodium: 142 mEq/L (ref 135–145)

## 2022-01-31 LAB — CBC
HCT: 38.2 % (ref 36.0–46.0)
Hemoglobin: 12.5 g/dL (ref 12.0–15.0)
MCHC: 32.8 g/dL (ref 30.0–36.0)
MCV: 90.2 fl (ref 78.0–100.0)
Platelets: 349 10*3/uL (ref 150.0–400.0)
RBC: 4.24 Mil/uL (ref 3.87–5.11)
RDW: 16 % — ABNORMAL HIGH (ref 11.5–15.5)
WBC: 5.7 10*3/uL (ref 4.0–10.5)

## 2022-01-31 LAB — TROPONIN I (HIGH SENSITIVITY): High Sens Troponin I: 4 ng/L (ref 2–17)

## 2022-02-04 LAB — CERVICOVAGINAL ANCILLARY ONLY
Bacterial Vaginitis (gardnerella): NEGATIVE
Candida Glabrata: NEGATIVE
Candida Vaginitis: NEGATIVE
Chlamydia: NEGATIVE
Comment: NEGATIVE
Comment: NEGATIVE
Comment: NEGATIVE
Comment: NEGATIVE
Comment: NEGATIVE
Comment: NORMAL
Neisseria Gonorrhea: NEGATIVE
Trichomonas: NEGATIVE

## 2022-02-06 ENCOUNTER — Other Ambulatory Visit (HOSPITAL_BASED_OUTPATIENT_CLINIC_OR_DEPARTMENT_OTHER): Payer: Self-pay

## 2022-02-14 ENCOUNTER — Ambulatory Visit: Payer: Medicaid Other | Admitting: Medical

## 2022-02-19 ENCOUNTER — Ambulatory Visit: Payer: Medicaid Other | Admitting: Medical

## 2022-02-20 ENCOUNTER — Ambulatory Visit (INDEPENDENT_AMBULATORY_CARE_PROVIDER_SITE_OTHER): Payer: Medicaid Other | Admitting: Orthopedic Surgery

## 2022-02-20 ENCOUNTER — Encounter: Payer: Self-pay | Admitting: Orthopedic Surgery

## 2022-02-20 DIAGNOSIS — Z89422 Acquired absence of other left toe(s): Secondary | ICD-10-CM

## 2022-02-20 NOTE — Progress Notes (Signed)
Office Visit Note   Patient: [name]           Date of Birth: March 08, 1965           MRN: [id number] Visit Date: 02/20/2022              Requested by: Mackie Pai, PA-C [place],   [id number] PCP: Mackie Pai, PA-C  Chief Complaint  Patient presents with   Left Foot - Routine Post Op    01/06/22 left 2nd toe amputation      HPI: Patient is a 57 year old woman who presents 2 and half months status post left foot second ray amputation.  Assessment & Plan: Visit Diagnoses:  1. History of partial ray amputation of second toe of left foot ([place])     Plan: Patient will continue with activities as tolerated recommended compression socks.  Follow-Up Instructions: No follow-ups on file.   Ortho Exam  Patient is alert, oriented, no adenopathy, well-dressed, normal affect, normal respiratory effort. Examination incision is well-healed she has minimal swelling no signs of infection.  She has good dorsiflexion of the ankle.  Imaging: No results found. No images are attached to the encounter.  Labs: Lab Results  Component Value Date   HGBA1C 9.8 (H) 08/26/2021   HGBA1C 12.3 (H) 04/23/2021   HGBA1C 7.9 (H) 08/24/2020   ESRSEDRATE 35 (H) 01/05/2022   ESRSEDRATE 41 (H) 08/24/2020   CRP 0.6 01/05/2022   REPTSTATUS 01/10/2022 FINAL 01/05/2022   GRAMSTAIN  05/01/2021    FEW WBC PRESENT,BOTH PMN AND MONONUCLEAR NO ORGANISMS SEEN    CULT  01/05/2022    NO GROWTH 5 DAYS Performed at [facility], [place] [address], [place], [place] [address]    LABORGA SERRATIA MARCESCENS (A) 05/21/2021     Lab Results  Component Value Date   ALBUMIN 3.1 (L) 01/07/2022   ALBUMIN 4.0 08/26/2021   ALBUMIN 3.9 05/28/2021   PREALBUMIN 23 01/05/2022    Lab Results  Component Value Date   MG 1.7 01/07/2022   MG 1.8 07/11/2017   No results found for: "VD25OH"  Lab Results  Component Value Date   PREALBUMIN 23 01/05/2022       Latest Ref Rng & Units 01/30/2022    3:40 PM 01/07/2022    3:37 AM 01/06/2022    3:00 AM  CBC EXTENDED  WBC 4.0 - 10.5 K/uL 5.7  6.5  7.9   RBC 3.87 - 5.11 Mil/uL 4.24  4.13  3.68   Hemoglobin 12.0 - 15.0 g/dL 12.5  12.2  11.2   HCT 36.0 - 46.0 % 38.2  37.1  32.4   Platelets 150.0 - 400.0 K/uL 349.0  346  307      There is no height or weight on file to calculate BMI.  Orders:  No orders of the defined types were placed in this encounter.  No orders of the defined types were placed in this encounter.    Procedures: No procedures performed  Clinical Data: No additional findings.  ROS:  All other systems negative, except as noted in the HPI. Review of Systems  Objective: Vital Signs: There were no vitals taken for this visit.  Specialty Comments:  No specialty comments available.  PMFS History: Patient Active Problem List   Diagnosis Date Noted   Osteomyelitis of second toe of left foot ([place]) 01/05/2022   Mood disorder ([place]) 01/05/2022   Sepsis ([place]) 05/21/2021   Chronic diastolic HF (  heart failure) ([place]) 05/21/2021   Abscess of left lower leg    Necrotizing fasciitis ([place])    Cellulitis of left leg 04/22/2021   Sepsis due to cellulitis ([place]) 04/22/2021   Severe sepsis ([place]) 04/22/2021   Acute kidney injury superimposed on chronic kidney disease ([place]) 04/22/2021   Chest pain, unspecified 04/22/2021   Uncontrolled type 2 diabetes mellitus with hyperglycemia, without long-term current use of insulin ([place]) 04/22/2021   Pain of left calf 04/19/2021   Viral upper respiratory tract infection 11/30/2020   Trigeminal neuralgia    Cervical cancer (HCC)    Chronic back pain    Coronary artery disease    GERD (gastroesophageal reflux disease)    Hyperlipidemia    Hypertension    Dyslipidemia 02/01/2019   Cardiac murmur 02/01/2019   Lumbar spinal stenosis 02/11/2018   Neuropathy 07/20/2017   Right flank pain    Nausea & vomiting 07/07/2017   Acute cystitis  07/07/2017   CAD (coronary artery disease) 07/07/2017   Metatarsalgia of both feet 03/11/2017   Diabetic gastroparesis associated with type 1 diabetes mellitus ([place]) 11/27/2016   Pure hypercholesterolemia 11/27/2016   Lumbar facet arthropathy 05/14/2016   Chronic pain syndrome 01/23/2016   Diabetic peripheral neuropathy ([place]) 01/23/2016   Anxiety 01/22/2016   History of cervical cancer 08/17/2015   Depression 08/10/2015   Overweight (BMI 25.0-29.9) 07/21/2014   Chronic kidney disease, stage III (moderate) ([place]) 05/23/2014   Diabetes type 2, uncontrolled 05/23/2014   Current use of insulin ([place]) 05/23/2014   Vitamin D deficiency 04/25/2013   Cerebrovascular disease 09/13/2012   Migraine 09/13/2012   Nonspecific abnormal findings on radiological and examination of skull and head 09/13/2012   Retention of urine 06/25/2011   ANKLE PAIN, LEFT 12/25/2009   DYSURIA 12/25/2009   VAGINITIS, CANDIDAL 12/25/2009   ULCER-GASTRIC 09/28/2009   DIABETIC PERIPHERAL NEUROPATHY 09/21/2009   TOBACCO ABUSE 09/21/2009   UTI (urinary tract infection) 09/21/2009   INSOMNIA 09/21/2009   TRANSAMINASES, SERUM, ELEVATED 09/21/2009   Elevation of level of transaminase or lactic acid dehydrogenase (LDH) 09/21/2009   Type 2 diabetes mellitus with stage 3 chronic kidney disease ([place]) 09/14/2009   Nausea with vomiting, unspecified 09/14/2009   ABDOMINAL PAIN-EPIGASTRIC 09/14/2009   Cardiomyopathy, secondary ([place]) 09/06/2009   Cardiomyopathy, unspecified ([place]) 09/06/2009   Essential hypertension 08/30/2009   DM2 (diabetes mellitus, type 2) ([place]) 07/30/2009   HLD (hyperlipidemia) 07/30/2009   GERD 07/30/2009   GASTROPARESIS 07/30/2009   CHEST PAIN, ATYPICAL 07/30/2009   POSITIVE PPD 07/30/2009   Type 2 diabetes mellitus with hyperglycemia ([place]) 07/30/2009   Gastric atony 07/30/2009   Gastroesophageal reflux disease 07/30/2009   Hyperlipidemia, unspecified 07/30/2009   Tuberculin test reaction 07/30/2009    Past Medical History:  Diagnosis Date   Anxiety 01/22/2016   Cardiac murmur 02/01/2019   Cardiomyopathy, secondary ([place]) 09/06/2009   Qualifier: Diagnosis of  By: Arvid Right   Formatting of this note might be different from the original. Overview:  Qualifier: Diagnosis of  By: Arvid Right   Chronic kidney disease, stage III (moderate) ([place]) 05/23/2014   Chronic pain syndrome 01/23/2016   Coronary artery disease    30% lesions noted   Depression    Diabetes type 2, uncontrolled 05/23/2014   Diabetic gastroparesis associated with type 1 diabetes mellitus ([place]) 11/27/2016   DIABETIC PERIPHERAL NEUROPATHY 09/21/2009   Qualifier: Diagnosis of  By: Amil Amen MD, [place]     Essential hypertension 08/30/2009   Qualifier: Diagnosis of  By: Lovette Cliche, CNA, Christy     Gastroesophageal reflux disease 07/30/2009   Formatting of this note might be different from the original. Overview:  Overview:  Qualifier: Diagnosis of  By: Amil Amen MD, Elby Showers: Diagnosis of  By: Chester Holstein NP, Ellwood Handler of this note might be different from the original. Overview:  Qualifier: Diagnosis of  By: Amil Amen MD, Elby Showers: Diagnosis of  By: Chester Holstein NP, Nevin Bloodgood   GERD (gastroesophageal reflux disease)    History of cervical cancer 08/17/2015   Status post partial hysterectomy  Formatting of this note might be different from the original. Status post partial hysterectomy   Hyperlipidemia    INSOMNIA 09/21/2009   Qualifier: Diagnosis of  By: Amil Amen MD, [name]     Lumbar facet arthropathy 05/14/2016   Lumbar spinal stenosis 02/11/2018   Metatarsalgia of both feet 03/11/2017   Migraine 09/13/2012   Overview:  IMPRESSION: possible abd migraine   TOBACCO ABUSE 09/21/2009   Qualifier: Diagnosis of  By: Amil Amen MD, Winona Legato of this note might be different from the original. Overview:  Qualifier: Diagnosis of  By: Amil Amen MD, [name]    Trigeminal neuralgia    ULCER-GASTRIC 09/28/2009   Qualifier: Diagnosis of  By: Chester Holstein NP, Lyn Records, CANDIDAL 12/25/2009   Qualifier: Diagnosis of  By: Amil Amen MD, Tessa Lerner D deficiency 04/25/2013    Family History  Problem Relation Age of Onset   Diabetes Mother    Diabetes Father     Past Surgical History:  Procedure Laterality Date   ABDOMINAL HYSTERECTOMY     partial   AMPUTATION Left 01/06/2022   Procedure: AMPUTATION 2nd TOE;  Surgeon: [name], MD;  Location: [facility];  Service: Orthopedics;  Laterality: Left;   CHOLECYSTECTOMY     I & D EXTREMITY Left 04/24/2021   Procedure: LEFT LEG DEBRIDEMENT;  Surgeon: [name], MD;  Location: [facility];  Service: Orthopedics;  Laterality: Left;   I & D EXTREMITY Left 05/01/2021   Procedure: LEFT KNEE DEBRIDEMENT;  Surgeon: [name], MD;  Location: [facility];  Service: Orthopedics;  Laterality: Left;   Social History   Occupational History   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

## 2022-02-24 ENCOUNTER — Telehealth: Payer: Self-pay | Admitting: Orthopedic Surgery

## 2022-02-24 NOTE — Telephone Encounter (Signed)
Patient would like refill on oxycodone '10mg'$ 

## 2022-02-24 NOTE — Telephone Encounter (Signed)
Pt is s/p left 2nd toe amputation on 01/06/22 and is requesting refill of Oxycodone 10 last refill was 01/23/2022 #30 please advise.

## 2022-02-25 ENCOUNTER — Telehealth: Payer: Self-pay

## 2022-02-25 NOTE — Telephone Encounter (Signed)
PA initiated via Covermymeds; KEY: [id number]. PA approved.   PA Case: [id number], Status: Approved, Coverage Starts on: 02/25/2022 12:00:00 AM, Coverage Ends on: 02/25/2023 12:00:00 AM

## 2022-02-26 ENCOUNTER — Telehealth: Payer: Self-pay | Admitting: Family

## 2022-02-26 MED ORDER — OXYCODONE HCL 5 MG PO CAPS: 5.0000 mg | ORAL_CAPSULE | Freq: Four times a day (QID) | ORAL | 0 refills | Status: AC | PRN

## 2022-02-26 NOTE — Telephone Encounter (Signed)
Message has been sent to [facility] to advise. Will sign off on this message and follow up with original request from Monday

## 2022-02-26 NOTE — Telephone Encounter (Signed)
Patient called in wondering about status of Rx refill please advise

## 2022-03-03 ENCOUNTER — Telehealth: Payer: Self-pay | Admitting: Orthopedic Surgery

## 2022-03-03 NOTE — Telephone Encounter (Signed)
Called patient. No answer. LMOM that pain medicine was sent to [facility] on [address] on 02/26/2022.

## 2022-03-03 NOTE — Telephone Encounter (Signed)
Patient is calling back about medication refill states pain is not as bad but it hurts a lot worse at night when its time for bed patient has been calling in about this refill since 01/15 please advise

## 2022-03-03 NOTE — Telephone Encounter (Signed)
Patient states she is waiting on her pain medication to be refilled

## 2022-03-04 ENCOUNTER — Telehealth: Payer: Self-pay | Admitting: Medical

## 2022-03-04 NOTE — Telephone Encounter (Signed)
Patient dropped off document to be filled out by provider  Disability paperwork and SS document . Patient requested to send it via Fax within ASAP. Document is located in providers tray at front office. Pt has fax # on Disability form and the fax # for SS form pt will call us to give fax number.

## 2022-03-07 NOTE — Telephone Encounter (Signed)
Pt dropped of paperwork. I have not seen her since July. I don't fill out complete disability type claims. I fill out short term type forms. If patient trying to get complete disability I usually ask them to get specialist fil out forms. Example I think pt had recent amputation. If that is primary reason she is disabled get surgeon to fill out form. She has various medical issues so not sure she has just one qualifying condition. On the form she checked that she had consultted with me on disability condition. I don't remember that. She needs follow up. Some comments about disability for loan agreement paper work is on your desk.  Please notify patient.

## 2022-03-10 NOTE — Telephone Encounter (Signed)
Pt called and lvm to return call and schedule an appt

## 2022-03-17 ENCOUNTER — Encounter: Payer: Self-pay | Admitting: Medical

## 2022-03-17 ENCOUNTER — Ambulatory Visit: Payer: Medicaid Other | Admitting: Medical

## 2022-03-17 VITALS — BP 133/75 | HR 90 | Temp 98.0°F | Resp 18 | Ht 72.0 in | Wt 188.4 lb

## 2022-03-17 DIAGNOSIS — Z111 Encounter for screening for respiratory tuberculosis: Secondary | ICD-10-CM

## 2022-03-17 DIAGNOSIS — Z794 Long term (current) use of insulin: Secondary | ICD-10-CM

## 2022-03-17 DIAGNOSIS — M79672 Pain in left foot: Secondary | ICD-10-CM | POA: Diagnosis not present

## 2022-03-17 DIAGNOSIS — I10 Essential (primary) hypertension: Secondary | ICD-10-CM | POA: Diagnosis not present

## 2022-03-17 DIAGNOSIS — R944 Abnormal results of kidney function studies: Secondary | ICD-10-CM | POA: Diagnosis not present

## 2022-03-17 DIAGNOSIS — G8929 Other chronic pain: Secondary | ICD-10-CM

## 2022-03-17 DIAGNOSIS — E1165 Type 2 diabetes mellitus with hyperglycemia: Secondary | ICD-10-CM

## 2022-03-17 DIAGNOSIS — M48061 Spinal stenosis, lumbar region without neurogenic claudication: Secondary | ICD-10-CM

## 2022-03-17 DIAGNOSIS — M549 Dorsalgia, unspecified: Secondary | ICD-10-CM

## 2022-03-17 NOTE — Patient Instructions (Signed)
1. Type 2 diabetes mellitus with hyperglycemia, with long-term current use of insulin ([place]) Pt had to leave do pick up family. Ask staff to get pt schedule fomorrow. - Hemoglobin A1c; Future - Comp Met (CMET); Future  2. Foot pain, left  - DG Foot Complete Left; Future  3. Hypertension, unspecified type Bp controlled continue amldipine  4. Decreased GFR - Hemoglobin A1c; Future - Comp Met (CMET); Future  5. Other chronic back pain and now severe foot pain Have you return tomorrow for uds and sign controlled med contract.  - Ambulatory referral to Neurosurgery  6. Spinal stenosis of lumbar region without neurogenic claudication - Ambulatory referral to Neurosurgery   On discussion you have financial form to fill out regarding days missed  work during the time you had surgery and missed /late payments.  Will fill out the form to my best ability stating the days you  missed work.  Regarding his any other form which is permanent disability recommend that you try to obtain all records from prior specialist and resubmit for disability.  Understand that each individual specialist office you  have seen in the past will need to be released their records.  Getting established/seen by ne wneurosurgeon might be helpful as spinal stenosis may be a significant factor in your disability.

## 2022-03-17 NOTE — Progress Notes (Signed)
Subjective:    Patient ID: [name], female    DOB: 12-13-1965, 57 y.o.   MRN: [id number]  HPI  Pt in for follow up.  Pt had her left second toe amputation January 06, 2022. Pt had infection on bottom of the foot. Dr. Sharol Given did the surgery.   Pt has been seeing endocrinologist. Pt was dc basagular 40 units at night. She is on metformin also.    Pt started back to work January 30, 2023. She missed about  one month of work. Specialist had wanted her to try waiting until January.  Pt states currently on light duty and sits daily. She states can sit a lot and she scans and bags medical supplies.   If she has to stand a lot and walk her foot hurts. She states pain can be siignaficant.   She has one job now. In past had 2-3 jobs.   Pt states she really needs mostly sit down job. Pt states she has most sedentary job presently. She saw Dr. Sharol Given last month.   Pt has been in using oxycodone prescribed by ortho office since orthopedist has been managing foot pain.  She also has hx of chronic back pain.    Hx of chronic back pain syndrome.   Chronic pain syndrome (Primary Dx);  Diabetic peripheral neuropathy ([place]);  Spinal stenosis of lumbar region with neurogenic claudication;  Lumbar facet arthropathy      Assessment: 1. Chronic pain syndrome  2. Diabetic peripheral neuropathy ([place])  3. Spinal stenosis of lumbar region with neurogenic claudication  4. Lumbar facet arthropathy    Pt states if she stood on her back all day back would hurt more. Presently foot hurts worse.         Review of Systems  Constitutional:  Negative for chills, fatigue and fever.  Respiratory:  Negative for cough, chest tightness, shortness of breath and wheezing.   Cardiovascular:  Negative for chest pain and palpitations.  Gastrointestinal:  Negative for abdominal distention, anal bleeding, diarrhea and nausea.  Genitourinary:  Negative for dysuria.  Musculoskeletal:  Positive  for back pain.  Skin:  Negative for rash.  Neurological:  Negative for dizziness, syncope, numbness and headaches.  Hematological:  Negative for adenopathy. Does not bruise/bleed easily.  Psychiatric/Behavioral:  Negative for behavioral problems and confusion.     Past Medical History:  Diagnosis Date   Anxiety 01/22/2016   Cardiac murmur 02/01/2019   Cardiomyopathy, secondary ([place]) 09/06/2009   Qualifier: Diagnosis of  By: Arvid Right   Formatting of this note might be different from the original. Overview:  Qualifier: Diagnosis of  By: Arvid Right   Chronic kidney disease, stage III (moderate) ([place]) 05/23/2014   Chronic pain syndrome 01/23/2016   Coronary artery disease    30% lesions noted   Depression    Diabetes type 2, uncontrolled 05/23/2014   Diabetic gastroparesis associated with type 1 diabetes mellitus ([place]) 11/27/2016   DIABETIC PERIPHERAL NEUROPATHY 09/21/2009   Qualifier: Diagnosis of  By: Amil Amen MD, [place] hypertension 08/30/2009   Qualifier: Diagnosis of  By: Lovette Cliche, CNA, Christy     Gastroesophageal reflux disease 07/30/2009   Formatting of this note might be different from the original. Overview:  Overview:  Qualifier: Diagnosis of  By: Amil Amen MD, Elby Showers: Diagnosis of  By: Chester Holstein NP, Ellwood Handler of this note might be different from the original. Overview:  Qualifier: Diagnosis of  By: Amil Amen MD, Elby Showers: Diagnosis of  By: Chester Holstein NP, Nevin Bloodgood   GERD (gastroesophageal reflux disease)    History of cervical cancer 08/17/2015   Status post partial hysterectomy  Formatting of this note might be different from the original. Status post partial hysterectomy   Hyperlipidemia    INSOMNIA 09/21/2009   Qualifier: Diagnosis of  By: Amil Amen MD, [name]     Lumbar facet arthropathy 05/14/2016   Lumbar spinal stenosis 02/11/2018   Metatarsalgia of both feet 03/11/2017   Migraine  09/13/2012   Overview:  IMPRESSION: possible abd migraine   TOBACCO ABUSE 09/21/2009   Qualifier: Diagnosis of  By: Amil Amen MD, Winona Legato of this note might be different from the original. 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  Tobacco Use   Smoking status: Former    Types: Cigarettes    Quit date: 1990    Years since quitting: 34.1   Smokeless tobacco: Never  Vaping Use   Vaping Use: Never used  Substance and Sexual Activity   Alcohol use: Yes    Comment: occasionally   Drug use: No   Sexual activity: Not on file  Other Topics Concern   Not on file  Social History Narrative   Not on file   Social Determinants of Health   Financial Resource Strain: Not on file  Food Insecurity: Not on file  Transportation Needs: Not on file  Physical Activity: Not on file  Stress: Not on file  Social Connections: Not on file  Intimate Partner Violence: Not on file    Past Surgical History:  Procedure Laterality Date   ABDOMINAL HYSTERECTOMY     partial   AMPUTATION Left 01/06/2022   Procedure: AMPUTATION 2nd TOE;  Surgeon: [name], MD;  Location: [facility];  Service: Orthopedics;  Laterality: Left;   CHOLECYSTECTOMY     I & D EXTREMITY Left 04/24/2021   Procedure: LEFT LEG DEBRIDEMENT;  Surgeon: [name], MD;  Location: [facility];  Service: Orthopedics;  Laterality: Left;   I & D EXTREMITY Left 05/01/2021   Procedure: LEFT KNEE DEBRIDEMENT;  Surgeon:  [name], MD;  Location: [facility];  Service: Orthopedics;  Laterality: Left;    Family History  Problem Relation Age of Onset   Diabetes Mother    Diabetes Father     Allergies  Allergen Reactions   Oxycodone-Acetaminophen Nausea Only   Insulin Aspart Swelling and Other (See Comments)    Rec'd Novolog on multiple admissions in 2023 with no reaction.   Latex Itching, Swelling and Rash   Morphine Itching and Rash    Current Outpatient Medications on File Prior to Visit  Medication Sig Dispense Refill   albuterol (VENTOLIN HFA) 108 (90 Base) MCG/ACT inhaler Inhale 2 puffs into the lungs every 6 (six) hours as needed for wheezing or shortness of breath. (Patient taking  differently: Inhale 2 puffs into the lungs 3 (three) times daily as needed for wheezing or shortness of breath.) 18 g 2   amLODipine (NORVASC) 5 MG tablet Take 1 tablet (5 mg total) by mouth daily. 90 tablet 3   aspirin 81 MG tablet Take 81 mg by mouth daily.     atorvastatin (LIPITOR) 80 MG tablet Take 1 tablet (80 mg total) by mouth daily. 90 tablet 3   busPIRone (BUSPAR) 15 MG tablet Take 1 tablet (15 mg total) by mouth 2 (two) times daily. 60 tablet 4   carvedilol (COREG) 25 MG tablet Take 1 tablet (25 mg total) by mouth 2 (two) times daily with a meal. 180 tablet 0   fluticasone (FLONASE) 50 MCG/ACT nasal spray Place 2 sprays into both nostrils daily. 16 g 1   fluticasone furoate-vilanterol (BREO ELLIPTA) 100-25 MCG/INH AEPB Inhale 1 puff into the lungs daily. 100 each 2   gabapentin (NEURONTIN) 300 MG capsule Take 2 capsules (600 mg total) by mouth 3 (three) times daily. 270 capsule 0   Insulin Glargine (BASAGLAR KWIKPEN) 100 UNIT/ML Inject 40 Units into the skin at bedtime. 12 mL 1   Insulin Pen Needle (PEN NEEDLES 31GX5/16") 31G X 8 MM MISC Use daily as directed 100 each 0   metFORMIN (GLUCOPHAGE-XR) 500 MG 24 hr tablet Take 1 tablet (500 mg total) by mouth daily with breakfast. 60 tablet 2   methocarbamol  (ROBAXIN) 750 MG tablet Take 1 tablet (750 mg total) by mouth every 8 (eight) hours as needed for muscle spasms. 30 tablet 0   mupirocin ointment (BACTROBAN) 2 % Apply 1 Application topically 2 (two) times daily. Apply to the affected area 2 times a day 22 g 3   nitroGLYCERIN (NITROSTAT) 0.4 MG SL tablet Place 1 tablet (0.4 mg total) under the tongue every 5 (five) minutes as needed for chest pain. 30 tablet 3   oxyCODONE (OXY IR/ROXICODONE) 5 MG immediate release tablet Take 1 tablet (5 mg total) by mouth every 6 (six) hours as needed for severe pain. 1 tab po bid prn severe pain 28 tablet 0   Oxycodone HCl 10 MG TABS Take 1 tablet (10 mg total) by mouth every 6 (six) hours as needed. 30 tablet 0   promethazine (PHENERGAN) 12.5 MG tablet Take 1 tablet (12.5 mg total) by mouth every 8 (eight) hours as needed for nausea or vomiting. 30 tablet 1   topiramate (TOPAMAX) 50 MG tablet Take 1 tablet (50 mg total) by mouth 2 (two) times daily. 60 tablet 12   VITAMIN D PO Take 1 capsule by mouth daily.     No current facility-administered medications on file prior to visit.    BP 130/88   Pulse 90   Temp 98 F (36.7 C)   Resp 18   Ht 6' (1.829 m)   Wt 188 lb 6.4 oz (85.5 kg)   SpO2 99%   BMI 25.55 kg/m    133/75   Pt had missed work from mid January 04, 2022 until December 27th. She need    Objective:   Physical Exam  General Appearance- Not in acute distress.    Chest and Lung Exam Auscultation: Breath sounds:-Normal. Clear even and unlabored. Adventitious sounds:- No Adventitious sounds.  Cardiovascular Auscultation:Rythm - Regular, rate and rythm. Heart Sounds -Normal heart sounds.  Abdomen Inspection:-Inspection Normal.  Palpation/Perucssion: Palpation and Percussion of the abdomen reveal- Non Tender, No Rebound tenderness, No rigidity(Guarding) and No Palpable abdominal masses.  Liver:-Normal.  Spleen:- Normal.   Back Mid lumbar spine tenderness to palpation. Pain on  straight leg lift. Pain on lateral movements and flexion/extension of the spine.  Lower ext neurologic  L5-S1 sensation intact bilaterally. Normal patellar reflexes bilaterally. No foot drop bilaterally.   Left foot- scar present on top of foot. Amputated second doe. No redness, no warmth or tenderness.      Assessment & Plan:   Patient Instructions  1. Type 2 diabetes mellitus with hyperglycemia, with long-term current use of insulin ([facility]) Pt had to leave do pick up family. Ask staff to get pt schedule fomorrow. - Hemoglobin A1c; Future - Comp Met (CMET); Future  2. Foot pain, left  - DG Foot Complete Left; Future  3. Hypertension, unspecified type Bp controlled continue amldipine  4. Decreased GFR - Hemoglobin A1c; Future - Comp Met (CMET); Future  5. Other chronic back pain and now severe foot pain Have you return tomorrow for uds and sign controlled med contract.  - Ambulatory referral to Neurosurgery  6. Spinal stenosis of lumbar region without neurogenic claudication - Ambulatory referral to Neurosurgery   On discussion you have financial form to fill out regarding days missed  work during the time you had surgery and missed /late payments.  Will fill out the form to my best ability stating the days you  missed work.  Regarding his any other form which is permanent disability recommend that you try to obtain all records from prior specialist and resubmit for disability.  Understand that each individual specialist office you  have seen in the past will need to be released their records.  Getting established/seen by ne wneurosurgeon might be helpful as spinal stenosis may be a significant factor in your disability.   Mackie Pai, PA-C   Time spent with patient today was 45  minutes which consisted of chart revdiew, discussing diagnosis, work up treatment and documentation.

## 2022-03-24 ENCOUNTER — Other Ambulatory Visit (INDEPENDENT_AMBULATORY_CARE_PROVIDER_SITE_OTHER): Payer: Medicaid Other

## 2022-03-24 ENCOUNTER — Telehealth: Payer: Self-pay

## 2022-03-24 DIAGNOSIS — R944 Abnormal results of kidney function studies: Secondary | ICD-10-CM | POA: Diagnosis not present

## 2022-03-24 DIAGNOSIS — Z794 Long term (current) use of insulin: Secondary | ICD-10-CM

## 2022-03-24 DIAGNOSIS — E1165 Type 2 diabetes mellitus with hyperglycemia: Secondary | ICD-10-CM | POA: Diagnosis not present

## 2022-03-24 NOTE — Telephone Encounter (Signed)
Opened to make contract

## 2022-03-25 LAB — HEMOGLOBIN A1C: Hgb A1c MFr Bld: 9.5 % — ABNORMAL HIGH (ref 4.6–6.5)

## 2022-03-25 LAB — COMPREHENSIVE METABOLIC PANEL
ALT: 21 U/L (ref 0–35)
AST: 23 U/L (ref 0–37)
Albumin: 3.9 g/dL (ref 3.5–5.2)
Alkaline Phosphatase: 117 U/L (ref 39–117)
BUN: 17 mg/dL (ref 6–23)
CO2: 31 mEq/L (ref 19–32)
Calcium: 9.2 mg/dL (ref 8.4–10.5)
Chloride: 101 mEq/L (ref 96–112)
Creatinine, Ser: 1.31 mg/dL — ABNORMAL HIGH (ref 0.40–1.20)
GFR: 45.31 mL/min — ABNORMAL LOW (ref >60.00)
Glucose, Bld: 144 mg/dL — ABNORMAL HIGH (ref 70–99)
Potassium: 4.1 mEq/L (ref 3.5–5.1)
Sodium: 140 mEq/L (ref 135–145)
Total Bilirubin: 0.3 mg/dL (ref 0.2–1.2)
Total Protein: 7.2 g/dL (ref 6.0–8.3)

## 2022-03-26 NOTE — Telephone Encounter (Signed)
I filled out pt form explaining her 3-4 week disability period following foot surgery. I understoond this may help her with missed/late car payment. She states she is now working. Asked MA to advise pt getting Dr. Sharol Given to fill out form may be helpful as well.  Jarrett Soho called pt to pick up form or she may be faxing it over.

## 2022-03-27 ENCOUNTER — Other Ambulatory Visit: Payer: Self-pay | Admitting: Medical

## 2022-03-27 ENCOUNTER — Ambulatory Visit: Payer: Medicaid Other | Admitting: Family Medicine

## 2022-03-27 ENCOUNTER — Encounter: Payer: Self-pay | Admitting: Family Medicine

## 2022-03-27 VITALS — BP 148/81 | HR 84 | Temp 97.8°F | Resp 16 | Ht 72.0 in | Wt 196.4 lb

## 2022-03-27 DIAGNOSIS — R300 Dysuria: Secondary | ICD-10-CM

## 2022-03-27 DIAGNOSIS — M545 Low back pain, unspecified: Secondary | ICD-10-CM | POA: Diagnosis not present

## 2022-03-27 DIAGNOSIS — R809 Proteinuria, unspecified: Secondary | ICD-10-CM

## 2022-03-27 DIAGNOSIS — N898 Other specified noninflammatory disorders of vagina: Secondary | ICD-10-CM

## 2022-03-27 LAB — POCT URINALYSIS DIPSTICK
Bilirubin, UA: NEGATIVE
Blood, UA: NEGATIVE
Glucose, UA: POSITIVE — AB
Ketones, UA: NEGATIVE
Nitrite, UA: NEGATIVE
Protein, UA: POSITIVE — AB
Spec Grav, UA: 1.02 (ref 1.010–1.025)
Urobilinogen, UA: 0.2 E.U./dL — AB
pH, UA: 6 (ref 5.0–8.0)

## 2022-03-27 NOTE — Addendum Note (Signed)
Addended by: Anabel Halon on: 03/27/2022 05:28 PM   Modules accepted: Orders

## 2022-03-27 NOTE — Patient Instructions (Signed)
Urine with minimal white blood cells - will send off for culture Also had some glucose and protein - will another test to check your kidneys since you have diabetes Adding some labs today as well We will let you know when results are back. In the meantime, keep well hydrated.

## 2022-03-27 NOTE — Progress Notes (Signed)
Acute Office Visit  Subjective:     Patient ID: [name], female    DOB: [birth year], 57 y.o.   MRN: [id number]  CC: urinary symptoms   HPI  Urinary Tract Infection: Patient complains of abnormal smelling urine, hesitancy, incomplete bladder emptying, nausea, suprapubic pressure, and left lower back pain up to 0000000 colicky pain, white vaginal discharge  She has had symptoms for 3 days. Patient denies congestion, cough, fever, headache, and stomach ache.     ROS All review of systems negative except what is listed in the HPI      Objective:    BP (!) 148/81   Pulse 84   Temp 97.8 F (36.6 C)   Resp 16   Ht 6' (1.829 m)   Wt 196 lb 6.4 oz (89.1 kg)   SpO2 98%   BMI 26.64 kg/m    Physical Exam Vitals reviewed.  Constitutional:      Appearance: Normal appearance.  Cardiovascular:     Rate and Rhythm: Normal rate and regular rhythm.     Pulses: Normal pulses.     Heart sounds: Normal heart sounds.  Pulmonary:     Effort: Pulmonary effort is normal.     Breath sounds: Normal breath sounds.  Abdominal:     General: Abdomen is flat. Bowel sounds are normal. There is no distension.     Palpations: Abdomen is soft.     Tenderness: There is no abdominal tenderness. There is no right CVA tenderness, left CVA tenderness or guarding.  Skin:    General: Skin is warm and dry.  Neurological:     Mental Status: She is alert and oriented to person, place, and time.  Psychiatric:        Mood and Affect: Mood normal.        Behavior: Behavior normal.        Thought Content: Thought content normal.        Judgment: Judgment normal.        Results for orders placed or performed in visit on 03/27/22  POC Urinalysis Dipstick  Result Value Ref Range   Color, UA Yellow    Clarity, UA Cloudy    Glucose, UA Positive (A) Negative   Bilirubin, UA Negative    Ketones, UA Negative    Spec Grav, UA 1.020 1.010 - 1.025   Blood, UA Negative    pH, UA 6.0 5.0 - 8.0    Protein, UA Positive (A) Negative   Urobilinogen, UA 0.2 (A) 0.2 or 1.0 E.U./dL   Nitrite, UA Negative    Leukocytes, UA Trace (A) Negative   Appearance     Odor          Assessment & Plan:   Problem List Items Addressed This Visit       Other   DYSURIA - Primary   Relevant Orders   POC Urinalysis Dipstick (Completed)   Urine Culture   Other Visit Diagnoses     Proteinuria, unspecified type       Relevant Orders   Microalbumin / creatinine urine ratio   Comp Met (CMET)   CBC w/Diff   Vaginal discharge       Relevant Orders   Cervicovaginal ancillary only   Acute left-sided low back pain without sciatica       Relevant Orders   Comp Met (CMET)   CBC w/Diff      UA with trace leuks, + protein and glucose. Sending for culture and adding  microalbumin since she is due anyway. Adding CMP, CBC. Vaginal swab sent off for yeast/BV workup. Declined STD testing. Discussed symptoms to monitor for and when to seek emergent care.  Patient agreeable to plan.    No orders of the defined types were placed in this encounter.   Return if symptoms worsen or fail to improve.  Terrilyn Saver, NP

## 2022-03-28 ENCOUNTER — Telehealth: Payer: Self-pay

## 2022-03-28 ENCOUNTER — Telehealth: Payer: Self-pay | Admitting: Medical

## 2022-03-28 ENCOUNTER — Other Ambulatory Visit: Payer: Self-pay | Admitting: Medical

## 2022-03-28 DIAGNOSIS — Z111 Encounter for screening for respiratory tuberculosis: Secondary | ICD-10-CM

## 2022-03-28 DIAGNOSIS — N898 Other specified noninflammatory disorders of vagina: Secondary | ICD-10-CM

## 2022-03-28 LAB — MICROALBUMIN / CREATININE URINE RATIO
Creatinine,U: 108 mg/dL
Microalb Creat Ratio: 45.5 mg/g — ABNORMAL HIGH (ref 0.0–30.0)
Microalb, Ur: 49.1 mg/dL — ABNORMAL HIGH (ref 0.0–1.9)

## 2022-03-28 MED ORDER — BASAGLAR KWIKPEN 100 UNIT/ML ~~LOC~~ SOPN: 40.0000 [IU] | PEN_INJECTOR | Freq: Every day | SUBCUTANEOUS | 1 refills | Status: DC

## 2022-03-28 MED ORDER — "PEN NEEDLES 5/16"" 31G X 8 MM MISC": 1.0000 | Freq: Every day | 0 refills | Status: DC

## 2022-03-28 NOTE — Telephone Encounter (Signed)
Pt denied appt for lab , stated she only cared about UTI

## 2022-03-28 NOTE — Telephone Encounter (Signed)
[facility] just wanted to advise they received the order for Cervicovaginal ancillary only but they have to recollect it due to a puncture in the foil.

## 2022-03-28 NOTE — Telephone Encounter (Signed)
Prescription Request  03/28/2022  Is this a "Controlled Substance" medicine? Yes  LOV: 03/17/2022  What is the name of the medication or equipment?   Oxycodone HCl 10 MG TABS [id number]   Have you contacted your pharmacy to request a refill? No   Which pharmacy would you like this sent to?   [facility], [address] [place] [address] Phone: [phone] Fax: [phone]  Patient notified that their request is being sent to the clinical staff for review and that they should receive a response within 2 business days.   Please advise at Mobile [phone] (mobile)

## 2022-03-28 NOTE — Telephone Encounter (Signed)
Labs reordered   Pt called and lab appt made

## 2022-03-28 NOTE — Telephone Encounter (Signed)
Initial Comment Caller states she is needing rx refills. c/o no sx. Additional Comment Doctor Not listed - Confirmed office location Translation No Nurse Assessment Nurse: Marcello Moores, RN, [place] Date/Time (Eastern Time): 03/27/2022 6:00:35 PM Confirm and document reason for call. If symptomatic, describe symptoms. ---Caller states she needs a prescription refill for her insulin. States no new or worsening symptoms at this time. States she will call the office in the morning and no longer wants this call. Does the patient have any new or worsening symptoms? ---No Please document clinical information provided and list any resource used. ---Recommended pt call the office during regular office hours and call back for any new or worsening symptoms. Disp. Time Eilene Ghazi Time) Disposition Final User 03/27/2022 5:54:49 PM Send To RN Personal Merry Lofty, RN, [facility] 03/27/2022 6:02:47 PM Clinical Call Yes Marcello Moores, RN, [place] Final Disposition 03/27/2022 6:02:47 PM Clinical Call Yes Marcello Moores, RN, [facility]

## 2022-03-28 NOTE — Telephone Encounter (Signed)
Requesting: oxycodone Contract:03/24/22 UDS:08/21/21 Last Visit:03/17/22 Next Visit:n/a Last Refill:01/23/22  Please Advise

## 2022-03-28 NOTE — Telephone Encounter (Signed)
Pt said she needs TB test done but cannot get the skin test because it bubbles up every time so she has to do the chest xray. Please place order and call pt to advise for scheduling.

## 2022-03-28 NOTE — Telephone Encounter (Signed)
Rx sent 

## 2022-03-28 NOTE — Telephone Encounter (Signed)
Okay to place orders for TB - Gold instead of cxr?

## 2022-03-29 LAB — URINE CULTURE
MICRO NUMBER:: 14570227
SPECIMEN QUALITY:: ADEQUATE

## 2022-03-30 MED ORDER — LISINOPRIL 5 MG PO TABS: 5.0000 mg | ORAL_TABLET | Freq: Every day | ORAL | 3 refills | Status: DC

## 2022-03-30 MED ORDER — CIPROFLOXACIN HCL 250 MG PO TABS: 250.0000 mg | ORAL_TABLET | Freq: Two times a day (BID) | ORAL | 0 refills | Status: AC

## 2022-03-30 NOTE — Addendum Note (Signed)
Addended by: Anabel Halon on: 03/30/2022 08:29 AM   Modules accepted: Orders

## 2022-03-30 NOTE — Addendum Note (Signed)
Addended by: Terrilyn Saver on: 03/30/2022 09:37 PM   Modules accepted: Orders

## 2022-03-30 NOTE — Progress Notes (Signed)
Microalbumin is elevated. Recommend adding low-dose lisinopril to help protect kidneys. Follow-up in 2-3 weeks with PCP Percell Miller) to assess blood pressure and BMP. Schedule appointment.  Urine culture does reveal a UTI. Based on resistance and kidney function, I am sending in Cipro for 3 days. Follow-up if symptoms not improved after this.    Estimated Creatinine Clearance: 59.5 mL/min (A) (by C-G formula based on SCr of 1.31 mg/dL (H)).

## 2022-03-31 ENCOUNTER — Telehealth: Payer: Self-pay | Admitting: Medical

## 2022-03-31 ENCOUNTER — Ambulatory Visit (HOSPITAL_BASED_OUTPATIENT_CLINIC_OR_DEPARTMENT_OTHER)
Admission: RE | Admit: 2022-03-31 | Discharge: 2022-03-31 | Disposition: A | Discharge status: Home / Self Care | Payer: Medicaid Other | Source: Ambulatory Visit | Attending: Medical | Admitting: Medical

## 2022-03-31 DIAGNOSIS — Z111 Encounter for screening for respiratory tuberculosis: Secondary | ICD-10-CM | POA: Diagnosis present

## 2022-03-31 NOTE — Telephone Encounter (Signed)
Got verbal from PCP to place cxr, cxr placed

## 2022-03-31 NOTE — Telephone Encounter (Signed)
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

## 2022-03-31 NOTE — Telephone Encounter (Signed)
[name] made aware earlier today , via previous phone note closing this note

## 2022-03-31 NOTE — Telephone Encounter (Signed)
Patient stated she still doesn't have pain meds , contract is updated

## 2022-03-31 NOTE — Telephone Encounter (Signed)
Current contract says 10 mg and it was updated at her last OV

## 2022-03-31 NOTE — Telephone Encounter (Signed)
Pt following up on the status of her pain medication refill. Pt said she signed a contract with front desk on last Monday and hasn't heard anything or seen where the oxycodone (OXY-IR) 5 MG capsule [id number]  has been sent in to her pharmacy. Pt uses [facility]. Please call pt back to advise.

## 2022-03-31 NOTE — Addendum Note (Signed)
Addended by: Jeronimo Greaves on: 03/31/2022 02:43 PM   Modules accepted: Orders

## 2022-04-01 MED ORDER — OXYCODONE HCL 10 MG PO TABS: 10.0000 mg | ORAL_TABLET | Freq: Four times a day (QID) | ORAL | 0 refills | Status: DC | PRN

## 2022-04-01 NOTE — Telephone Encounter (Signed)
Pt called back to follow up on meds status. Pt stated that she signed a contract with Percell Miller on Monday and she was under the impression that is all that needed to be done. After discussing with Judson Roch., advised pt that she would be getting with Percell Miller on this matter and letting her know as soon as she hears back. Pt acknowledged understanding.

## 2022-04-01 NOTE — Addendum Note (Signed)
Addended by: Anabel Halon on: 04/01/2022 10:35 AM   Modules accepted: Orders

## 2022-04-01 NOTE — Telephone Encounter (Signed)
Pt denied tb gold , yesterday when I called

## 2022-04-03 ENCOUNTER — Telehealth: Payer: Self-pay | Admitting: Medical

## 2022-04-03 DIAGNOSIS — N3090 Cystitis, unspecified without hematuria: Secondary | ICD-10-CM

## 2022-04-03 MED ORDER — CEFDINIR 300 MG PO CAPS: 300.0000 mg | ORAL_CAPSULE | Freq: Two times a day (BID) | ORAL | 0 refills | Status: AC

## 2022-04-03 NOTE — Telephone Encounter (Signed)
Pt called to advise that medication she was prescribed for UTI either is not working or is not strong enough and she would like a refill because she is still feeling pain in lower stomach so she knows it is not gone completely. Pt requesting medication to be sent to [facility].

## 2022-04-03 NOTE — Addendum Note (Signed)
Addended by: Terrilyn Saver on: 04/03/2022 03:57 PM   Modules accepted: Orders

## 2022-04-03 NOTE — Telephone Encounter (Signed)
Spoke with pt she is aware that a new Rx has been sent in pt stated she will go ahead and start it and if it does not work pt agrees to come in for a follow up appt.

## 2022-04-07 ENCOUNTER — Ambulatory Visit (INDEPENDENT_AMBULATORY_CARE_PROVIDER_SITE_OTHER): Payer: Medicaid Other | Admitting: Family Medicine

## 2022-04-07 ENCOUNTER — Encounter: Payer: Self-pay | Admitting: Family Medicine

## 2022-04-07 ENCOUNTER — Encounter: Payer: Self-pay | Admitting: Medical

## 2022-04-07 VITALS — BP 108/68 | HR 85 | Temp 98.0°F | Ht 72.0 in | Wt 200.0 lb

## 2022-04-07 DIAGNOSIS — L989 Disorder of the skin and subcutaneous tissue, unspecified: Secondary | ICD-10-CM

## 2022-04-07 MED ORDER — METHYLPREDNISOLONE 4 MG PO TBPK: ORAL_TABLET | ORAL | 0 refills | Status: DC

## 2022-04-07 NOTE — Patient Instructions (Signed)
Ice/cold pack over area for 10-15 min twice daily.  OK to take Tylenol 1000 mg (2 extra strength tabs) or 975 mg (3 regular strength tabs) every 6 hours as needed.  If things get worse, seek care.  Let us know if you need anything.

## 2022-04-07 NOTE — Progress Notes (Signed)
Chief Complaint  Patient presents with   Rash    Swelling on both legs     [name] is a 57 y.o. female here for a skin complaint.  Duration: 10 days; worsening over the past 3 d after starting [place].  Location: legs Pruritic? Yes Painful? Yes Drainage? No New soaps/lotions/topicals/detergents? No Sick contacts? No Other associated symptoms: no fevers, calf pain, swelling, shortness of breath Therapies tried thus far: Activity modification  Past Medical History:  Diagnosis Date   Anxiety 01/22/2016   Cardiac murmur 02/01/2019   Cardiomyopathy, secondary ([place]) 09/06/2009   Qualifier: Diagnosis of  By: Arvid Right   Formatting of this note might be different from the original. Overview:  Qualifier: Diagnosis of  By: Arvid Right   Chronic kidney disease, stage III (moderate) ([place]) 05/23/2014   Chronic pain syndrome 01/23/2016   Coronary artery disease    30% lesions noted   Depression    Diabetes type 2, uncontrolled 05/23/2014   Diabetic gastroparesis associated with type 1 diabetes mellitus ([place]) 11/27/2016   DIABETIC PERIPHERAL NEUROPATHY 09/21/2009   Qualifier: Diagnosis of  By: Amil Amen MD, [place]     Essential hypertension 08/30/2009   Qualifier: Diagnosis of  By: Lovette Cliche, CNA, Christy     Gastroesophageal reflux disease 07/30/2009   Formatting of this note might be different from the original. Overview:  Overview:  Qualifier: Diagnosis of  By: Amil Amen MD, Elby Showers: Diagnosis of  By: Chester Holstein NP, Ellwood Handler of this note might be different from the original. Overview:  Qualifier: Diagnosis of  By: Amil Amen MD, Elby Showers: Diagnosis of  By: Chester Holstein NP, Nevin Bloodgood   GERD (gastroesophageal reflux disease)    History of cervical cancer 08/17/2015   Status post partial hysterectomy  Formatting of this note might be different from the original. Status post partial hysterectomy   Hyperlipidemia    INSOMNIA  09/21/2009   Qualifier: Diagnosis of  By: Amil Amen MD, [name]     Lumbar facet arthropathy 05/14/2016   Lumbar spinal stenosis 02/11/2018   Metatarsalgia of both feet 03/11/2017   Migraine 09/13/2012   Overview:  IMPRESSION: possible abd migraine   TOBACCO ABUSE 09/21/2009   Qualifier: Diagnosis of  By: Amil Amen MD, Winona Legato of this note might be different from the original. Overview:  Qualifier: Diagnosis of  By: Amil Amen MD, [name]   Trigeminal neuralgia    ULCER-GASTRIC 09/28/2009   Qualifier: Diagnosis of  By: Chester Holstein NP, Lyn Records, CANDIDAL 12/25/2009   Qualifier: Diagnosis of  By: Amil Amen MD, Tessa Lerner D deficiency 04/25/2013    BP 108/68 (BP Location: Left Arm, Patient Position: Sitting, Cuff Size: Normal)   Pulse 85   Temp 98 F (36.7 C) (Oral)   Ht 6' (1.829 m)   Wt 200 lb (90.7 kg)   SpO2 95%   BMI 27.12 kg/m  Gen: awake, alert, appearing stated age Heart: RRR, DP pulses 2+ bilaterally, 2+ pitting bilateral lower extremity edema MSK: No calf TTP Lungs: No accessory muscle use Skin: See below.  The lesions do not blanch.  +ttp over lesions. There is no excessive warmth. No drainage, erythema, fluctuance, excoriation Psych: Age appropriate judgment and insight   LLE   LLE   RLE  Skin lesion - Plan: methylPREDNISolone (MEDROL DOSEPAK) 4 MG TBPK tablet  Will cover for a vasculitis.  She gets swelling with prednisone.  Ice, Tylenol.  Follow-up in 1 week if no improvement.   The patient voiced understanding and agreement to the plan.  [place], DO 04/07/22 3:23 PM

## 2022-04-09 ENCOUNTER — Telehealth: Payer: Self-pay | Admitting: Medical

## 2022-04-09 MED ORDER — OXYCODONE HCL 10 MG PO TABS: 10.0000 mg | ORAL_TABLET | Freq: Four times a day (QID) | ORAL | 0 refills | Status: DC | PRN

## 2022-04-09 NOTE — Telephone Encounter (Signed)
Spoke with Andee Poles at [facility] , she stated pt did pick up 5 day supply and will need a new script in order to get the order 25

## 2022-04-09 NOTE — Telephone Encounter (Signed)
Contract:03/24/22 UDS:03/24/22 Last Visit:03/17/22 Next Visit:n/a Last Refill:01/23/22

## 2022-04-09 NOTE — Telephone Encounter (Signed)
Pt called to let pt know her medication was sent in 04/02/23 but no answer

## 2022-04-09 NOTE — Telephone Encounter (Signed)
Pt states she was only prescribed a 5 day supply of this medicine and she is out. Please advise.

## 2022-04-09 NOTE — Telephone Encounter (Signed)
Prescription Request  04/09/2022  Is this a "Controlled Substance" medicine? Yes  LOV: 03/17/2022  What is the name of the medication or equipment?   Oxycodone HCl 10 MG TABS [id number]   Have you contacted your pharmacy to request a refill? No   Which pharmacy would you like this sent to?   [facility] [place] [address] Phone: [phone] Fax: [phone]   Patient notified that their request is being sent to the clinical staff for review and that they should receive a response within 2 business days.   Please advise at Mobile [phone] (mobile)

## 2022-04-10 ENCOUNTER — Telehealth: Payer: Self-pay

## 2022-04-10 NOTE — Telephone Encounter (Signed)
PA approved.   PA Case: [id number], Status: Approved, Coverage Starts on: 04/10/2022 12:00:00 AM, Coverage Ends on: 10/07/2022 12:00:00 AM.

## 2022-04-10 NOTE — Telephone Encounter (Signed)
Pt has follow up 04/22/22

## 2022-04-10 NOTE — Telephone Encounter (Signed)
PA initiated via Covermymeds; KEY: [id number]. Awaiting determination.

## 2022-04-14 ENCOUNTER — Ambulatory Visit: Payer: Medicaid Other | Attending: Physical Therapy | Admitting: Physical Therapy

## 2022-04-15 NOTE — Telephone Encounter (Signed)
Pt would like these results faxed to her job, attn: [employer] and f# [phone].

## 2022-04-15 NOTE — Telephone Encounter (Signed)
Tb results faxed to [employer]

## 2022-04-16 ENCOUNTER — Ambulatory Visit: Payer: Medicaid Other | Admitting: Medical

## 2022-04-22 ENCOUNTER — Ambulatory Visit: Payer: Medicaid Other | Admitting: Medical

## 2022-04-24 ENCOUNTER — Encounter (HOSPITAL_BASED_OUTPATIENT_CLINIC_OR_DEPARTMENT_OTHER): Payer: Self-pay | Admitting: Emergency Medicine

## 2022-04-24 ENCOUNTER — Emergency Department (HOSPITAL_BASED_OUTPATIENT_CLINIC_OR_DEPARTMENT_OTHER)
Admission: EM | Admit: 2022-04-24 | Discharge: 2022-04-24 | Disposition: A | Discharge status: Home / Self Care | Payer: Commercial Managed Care - HMO | Attending: Emergency Medicine | Admitting: Emergency Medicine

## 2022-04-24 ENCOUNTER — Emergency Department (HOSPITAL_BASED_OUTPATIENT_CLINIC_OR_DEPARTMENT_OTHER): Payer: Commercial Managed Care - HMO

## 2022-04-24 DIAGNOSIS — N183 Chronic kidney disease, stage 3 unspecified: Secondary | ICD-10-CM | POA: Diagnosis not present

## 2022-04-24 DIAGNOSIS — E1122 Type 2 diabetes mellitus with diabetic chronic kidney disease: Secondary | ICD-10-CM | POA: Insufficient documentation

## 2022-04-24 DIAGNOSIS — I2510 Atherosclerotic heart disease of native coronary artery without angina pectoris: Secondary | ICD-10-CM | POA: Insufficient documentation

## 2022-04-24 DIAGNOSIS — T783XXA Angioneurotic edema, initial encounter: Secondary | ICD-10-CM | POA: Diagnosis not present

## 2022-04-24 DIAGNOSIS — T464X5A Adverse effect of angiotensin-converting-enzyme inhibitors, initial encounter: Secondary | ICD-10-CM

## 2022-04-24 DIAGNOSIS — Z8541 Personal history of malignant neoplasm of cervix uteri: Secondary | ICD-10-CM | POA: Insufficient documentation

## 2022-04-24 DIAGNOSIS — I129 Hypertensive chronic kidney disease with stage 1 through stage 4 chronic kidney disease, or unspecified chronic kidney disease: Secondary | ICD-10-CM | POA: Insufficient documentation

## 2022-04-24 DIAGNOSIS — K130 Diseases of lips: Secondary | ICD-10-CM | POA: Diagnosis present

## 2022-04-24 DIAGNOSIS — M25552 Pain in left hip: Secondary | ICD-10-CM | POA: Diagnosis not present

## 2022-04-24 MED ORDER — FAMOTIDINE IN NACL 20-0.9 MG/50ML-% IV SOLN
20.0000 mg | Freq: Once | INTRAVENOUS | Status: AC
  Administered 2022-04-24: 20 mg via INTRAVENOUS
  Filled 2022-04-24: qty 50

## 2022-04-24 MED ORDER — METHYLPREDNISOLONE SODIUM SUCC 125 MG IJ SOLR
125.0000 mg | Freq: Once | INTRAMUSCULAR | Status: AC
  Administered 2022-04-24: 125 mg via INTRAVENOUS
  Filled 2022-04-24: qty 2

## 2022-04-24 MED ORDER — PREDNISONE 10 MG PO TABS: 5.0000 mg | ORAL_TABLET | Freq: Every day | ORAL | 0 refills | Status: AC

## 2022-04-24 MED ORDER — OXYCODONE HCL 5 MG PO TABS
5.0000 mg | ORAL_TABLET | Freq: Once | ORAL | Status: AC
  Administered 2022-04-24: 5 mg via ORAL
  Filled 2022-04-24: qty 1

## 2022-04-24 MED ORDER — KETOROLAC TROMETHAMINE 15 MG/ML IJ SOLN
15.0000 mg | Freq: Once | INTRAMUSCULAR | Status: AC
  Administered 2022-04-24: 15 mg via INTRAVENOUS
  Filled 2022-04-24: qty 1

## 2022-04-24 MED ORDER — DIPHENHYDRAMINE HCL 50 MG/ML IJ SOLN
50.0000 mg | Freq: Once | INTRAMUSCULAR | Status: AC
  Administered 2022-04-24: 50 mg via INTRAVENOUS
  Filled 2022-04-24: qty 1

## 2022-04-24 MED ORDER — ACETAMINOPHEN 500 MG PO TABS
1000.0000 mg | ORAL_TABLET | Freq: Once | ORAL | Status: AC
  Administered 2022-04-24: 1000 mg via ORAL
  Filled 2022-04-24: qty 2

## 2022-04-24 NOTE — ED Notes (Signed)
Discharge paperwork reviewed entirely with patient, including Rx's and follow up care. Pain was under control. Pt verbalized understanding as well as all parties involved. No questions or concerns voiced at the time of discharge. No acute distress noted.   Pt ambulated out to PVA without incident or assistance.  

## 2022-04-24 NOTE — ED Triage Notes (Signed)
Pt lip swelling yesterday since 1500 yesterday. Pt does take lisinopril. Also left hip/back pain X 1 week tried OTC meds with no relief.

## 2022-04-24 NOTE — ED Provider Notes (Signed)
7:06 AM Assumed care of patient from off-going team. For more details, please see note from same day.  In brief, this is a 57 y.o. female with lower lip swelling, recently started lisinopril. Angioedema. Started at 3 pm yesterday, she didn't take her lisinopril this AM. S/p benadryl, pepcid, solumedrol. Also has L hip pain x 1 week.   Plan/Dispo at time of sign-out & ED Course since sign-out: '[ ]'$  observe for a few hours, then DC with burst '[ ]'$  hip XR  BP 123/83 (BP Location: Right Arm)   Pulse 92   Temp (!) 97.4 F (36.3 C) (Oral)   Resp 18   Ht 6' (1.829 m)   Wt 90 kg   SpO2 98%   BMI 26.91 kg/m    ED Course:   Clinical Course as of 04/24/22 0924  Thu Apr 24, 2022  0728 DG Hip Unilat W or Wo Pelvis 2-3 Views Left FINDINGS: There is no evidence of hip fracture or dislocation. Mild degenerative changes seen involving both hips in the form of joint space narrowing, acetabular sclerosis and lateral acetabular bony spurring. Numerous radiopaque surgical clips are seen overlying the lower abdomen and pelvis.  IMPRESSION: No acute osseous abnormality.   [HN]  [id number] Patient evaluated, she states she feels the same as when she got here. No increase in her swelling per patient. Observed to have isolated lower lip swelling, L>R side, no oropharyngeal edema. Will CTM for another couple of hours and DC if stable. [HN]  0802 Allowed patient to take PO [HN]  0920 Patient's symptoms are unchanged.  Her lower lip swelling is unchanged and she still has no oropharyngeal swelling.  She has no stridor or hypoxia difficulty breathing.  Patient's left hip x-ray is also negative patient will continue with her home medicines, I recommend she had Tylenol and ibuprofen and follow-up with her primary care physician if her pain gets worse or does not improve.  Lisinopril was already added to patient's allergy list as angioedema high alert.  Patient is instructed to never take lisinopril or its analogs  again.  Will DC with prednisone burst,, discharge instructions, return precautions, and PCP follow-up in 1 to 2 weeks.  All questions answered to patient satisfaction. [HN]    Clinical Course User Index [HN] Audley Hose, MD    Dispo:  ------------------------------- Cindee Lame, MD Emergency Medicine  This note was created using dictation software, which may contain spelling or grammatical errors.   Audley Hose, MD 04/24/22 [phone]

## 2022-04-24 NOTE — Discharge Instructions (Addendum)
I suspect the swelling in your lip is due to a blood pressure medicine you are taking (Lisinopril).  You should stop taking this medicine immediately and alert your physician team that you have had this reaction.  I have listed it in the chart as well. We will prescribe prednisone 20 mg to take every day for 5 days starting tomorrow.  For her pain and discomfort you can add Tylenol 1000 mg every 8 hours as well as 600 mg ibuprofen every 8 hours, you can alternate these for additional pain effects.  Please follow up with your primary care provider within 1 week.   Do not hesitate to return to the ED or call 911 if you experience: -Worsening symptoms -Difficulty breathing -Fevers/chills -Throat swelling -Lightheadedness, passing out -Fevers/chills -Anything else that concerns you

## 2022-04-24 NOTE — ED Notes (Signed)
Patient transported to X-ray 

## 2022-04-24 NOTE — ED Provider Notes (Signed)
Emergency Department Provider Note   I have reviewed the triage vital signs and the nursing notes.   HISTORY  Chief Complaint Oral Swelling and Hip Pain   HPI [name] is a 57 y.o. female with past history of hypertension, diabetes, CAD presents the emergency department for evaluation of lower lip swelling.  She started lisinopril 3 weeks ago for hypertension.  She states shortly after taking this medicine she developed focal swelling in her lower extremities, mainly on the left which improved with a steroid burst with taper from her PCP.  She was not instructed to stop the lisinopril as it was thought to be unrelated but as she continued taking the medication she noticed occasional tightness feeling in the throat but no lip swelling.  Yesterday at 3 PM she began having swelling to the right side of her lower lip.  This morning, she woke and found that the entire lower lip is now swollen.  She is not feeling any tongue swelling or tightness in the back of her throat.  No shortness of breath or difficulty swallowing.  She did not take additional lisinopril this morning.  No rash.   Additionally, she is having some left hip discomfort and lower back discomfort.  No injury.  No numbness or weakness.  This has been ongoing for the past week. No visible swelling.    Past Medical History:  Diagnosis Date   Anxiety 01/22/2016   Cardiac murmur 02/01/2019   Cardiomyopathy, secondary ([place]) 09/06/2009   Qualifier: Diagnosis of  By: Arvid Right   Formatting of this note might be different from the original. Overview:  Qualifier: Diagnosis of  By: Arvid Right   Chronic kidney disease, stage III (moderate) ([place]) 05/23/2014   Chronic pain syndrome 01/23/2016   Coronary artery disease    30% lesions noted   Depression    Diabetes type 2, uncontrolled 05/23/2014   Diabetic gastroparesis associated with type 1 diabetes mellitus ([place]) 11/27/2016    DIABETIC PERIPHERAL NEUROPATHY 09/21/2009   Qualifier: Diagnosis of  By: Amil Amen MD, [place]     Essential hypertension 08/30/2009   Qualifier: Diagnosis of  By: Lovette Cliche, CNA, Christy     Gastroesophageal reflux disease 07/30/2009   Formatting of this note might be different from the original. Overview:  Overview:  Qualifier: Diagnosis of  By: Amil Amen MD, Elby Showers: Diagnosis of  By: Chester Holstein NP, Ellwood Handler of this note might be different from the original. Overview:  Qualifier: Diagnosis of  By: Amil Amen MD, Elby Showers: Diagnosis of  By: Chester Holstein NP, Nevin Bloodgood   GERD (gastroesophageal reflux disease)    History of cervical cancer 08/17/2015   Status post partial hysterectomy  Formatting of this note might be different from the original. Status post partial hysterectomy   Hyperlipidemia    INSOMNIA 09/21/2009   Qualifier: Diagnosis of  By: Amil Amen MD, [name]     Lumbar facet arthropathy 05/14/2016   Lumbar spinal stenosis 02/11/2018   Metatarsalgia of both feet 03/11/2017   Migraine 09/13/2012   Overview:  IMPRESSION: possible abd migraine   TOBACCO ABUSE 09/21/2009   Qualifier: Diagnosis of  By: Amil Amen MD, Winona Legato of this note might be different from the original. Overview:  Qualifier: Diagnosis of  By: Amil Amen MD, [name]   Trigeminal neuralgia    ULCER-GASTRIC 09/28/2009   Qualifier: Diagnosis of  By: Chester Holstein NP, Lyn Records, CANDIDAL 12/25/2009  Qualifier: Diagnosis of  By: Amil Amen MD, Tessa Lerner D deficiency 04/25/2013    Review of Systems  Constitutional: No fever/chills ENT: No sore throat. Lower lip swelling.  Cardiovascular: Denies chest pain. Respiratory: Denies shortness of breath. Gastrointestinal: No abdominal pain.   Musculoskeletal: Positive back and left hip pain.  Skin: Negative for rash. Neurological: Negative for headaches, focal weakness or  numbness.   ____________________________________________   PHYSICAL EXAM:  VITAL SIGNS: ED Triage Vitals  Enc Vitals Group     BP 04/24/22 0630 123/83     Pulse Rate 04/24/22 0630 92     Resp 04/24/22 0630 18     Temp 04/24/22 0630 (!) 97.4 F (36.3 C)     Temp Source 04/24/22 0630 Oral     SpO2 04/24/22 0630 98 %     Weight 04/24/22 0627 198 lb 6.6 oz (90 kg)     Height 04/24/22 0627 6' (1.829 m)   Constitutional: Alert and oriented. Well appearing and in no acute distress. Eyes: Conjunctivae are normal.  Head: Atraumatic. Nose: No congestion/rhinnorhea. Mouth/Throat: Mucous membranes are moist.  Oropharynx non-erythematous.  Patient speaking in a clear voice with no trismus.  No angioedema of the tongue or visible posterior pharynx.  Patient managing oral secretions.  Diffuse swelling throughout the lower lip.  Normal upper lip.  Neck: No stridor.  Cardiovascular: Normal rate, regular rhythm. Good peripheral circulation. Grossly normal heart sounds.   Respiratory: Normal respiratory effort.  No retractions. Lungs CTAB. Gastrointestinal: Soft and nontender. No distention.  Musculoskeletal: No lower extremity tenderness nor edema. No gross deformities of extremities. Neurologic:  Normal speech and language. No gross focal neurologic deficits are appreciated.  Skin:  Skin is warm, dry and intact. No rash noted.  ____________________________________________  [id number]  DG Hip Unilat W or Wo Pelvis 2-3 Views Left  Result Date: 04/24/2022 CLINICAL DATA:  Left-sided hip pain x1 week. EXAM: DG HIP (WITH OR WITHOUT PELVIS) 2-3V LEFT COMPARISON:  None Available. FINDINGS: There is no evidence of hip fracture or dislocation. Mild degenerative changes seen involving both hips in the form of joint space narrowing, acetabular sclerosis and lateral acetabular bony spurring. Numerous radiopaque surgical clips are seen overlying the lower abdomen and pelvis. IMPRESSION: No acute osseous  abnormality. Electronically Signed   By: Virgina Norfolk M.D.   On: 04/24/2022 07:20    ____________________________________________   PROCEDURES  Procedure(s) performed:   Procedures  None  ____________________________________________   INITIAL IMPRESSION / ASSESSMENT AND PLAN / ED COURSE  Pertinent labs & imaging results that were available during my care of the patient were reviewed by me and considered in my medical decision making (see chart for details).   This patient is Presenting for Evaluation of lip swelling, which does require a range of treatment options, and is a complaint that involves a high risk of morbidity and mortality.  The Differential Diagnoses include ACE angioedema, anaphylaxis, hereditary angioedema, etc.  Critical Interventions-    Medications  famotidine (PEPCID) IVPB 20 mg premix (0 mg Intravenous Stopped 04/24/22 0807)  methylPREDNISolone sodium succinate (SOLU-MEDROL) 125 mg/2 mL injection 125 mg (125 mg Intravenous Given 04/24/22 0651)  diphenhydrAMINE (BENADRYL) injection 50 mg (50 mg Intravenous Given 04/24/22 0650)  acetaminophen (TYLENOL) tablet 1,000 mg (1,000 mg Oral Given 04/24/22 0924)  ketorolac (TORADOL) 15 MG/ML injection 15 mg (15 mg Intravenous Given 04/24/22 0924)  oxyCODONE (Oxy IR/ROXICODONE) immediate release tablet 5 mg (5 mg Oral Given 04/24/22 0924)  Reassessment after intervention:  No progression of swelling.   I decided to review pertinent External Data, and in summary patient seen by her PCP on 2/26 for lower extremity swelling thought to be secondary to [facility]. Pictures of swelling reviewed. ? Angioedema.   Radiologic Tests: Hip x-ray ordered and pending.    Social Determinants of Health Risk patient is a non-smoker.   Medical Decision Making: Summary:  Patient presents emergency department with intermittent swelling since starting lisinopril several weeks ago.  Was seen in the PCP office in late February with lower  extremity swelling thought initially to be due to recent antibiotic/allergic reaction but in hindsight may have been an angioedema presentation at that time.  At this time, she is having lip swelling isolated to the lower lip at this point.  I do not see an indication for emergent airway management.  I will give initial treatment but doubt we will have much success.  We did have an in-depth conversation about immediately stopping lisinopril and never taking this medicine again.  I have listed as an allergy in her chart.  Plan for x-ray of the left hip as well given 1 week of pain.   Considered admission but plan for meds and ED observation. Care transferred to Dr. Mayra Neer.  Patient's presentation is most consistent with acute presentation with potential threat to life or bodily function.   Disposition: pending  ____________________________________________  FINAL CLINICAL IMPRESSION(S) / ED DIAGNOSES  Final diagnoses:  Angiotensin converting enzyme inhibitor-aggravated angioedema, initial encounter  Left hip pain    Note:  This document was prepared using Dragon voice recognition software and may include unintentional dictation errors.  Nanda Quinton, MD, [facility] Emergency Medicine    Ely Spragg, Wonda Old[facility], MD 04/24/22 [phone]

## 2022-04-24 NOTE — ED Notes (Signed)
EDP cleared pt to have po fluids Pt provided crackers and juice.

## 2022-05-16 ENCOUNTER — Other Ambulatory Visit: Payer: Self-pay | Admitting: Medical

## 2022-05-16 NOTE — Telephone Encounter (Signed)
Medication:  Oxycodone HCl 10 MG TABS  methocarbamol (ROBAXIN) 750 MG tablet  Has the patient contacted their pharmacy? No.   Preferred Pharmacy:  [facility] [address] [place] - [address] [place] [address] Phone: [phone]  Fax: [phone]

## 2022-05-16 NOTE — Telephone Encounter (Signed)
Requesting: oxycodone Contract: 03/24/22 UDS: 03/24/22 Last Visit: 03/27/22 Next Visit: n/a Last Refill: 04/09/22  Please Advise

## 2022-05-19 MED ORDER — OXYCODONE HCL 10 MG PO TABS: 10.0000 mg | ORAL_TABLET | Freq: Four times a day (QID) | ORAL | 0 refills | Status: DC | PRN

## 2022-05-19 MED ORDER — METHOCARBAMOL 750 MG PO TABS: 750.0000 mg | ORAL_TABLET | Freq: Three times a day (TID) | ORAL | 0 refills | Status: DC | PRN

## 2022-05-22 ENCOUNTER — Other Ambulatory Visit: Payer: Self-pay | Admitting: Medical

## 2022-06-18 ENCOUNTER — Telehealth: Payer: Self-pay | Admitting: Medical

## 2022-06-18 MED ORDER — CARVEDILOL 25 MG PO TABS: 25.0000 mg | ORAL_TABLET | Freq: Two times a day (BID) | ORAL | 0 refills | Status: DC

## 2022-06-18 NOTE — Telephone Encounter (Signed)
Prescription Request  06/18/2022  Is this a "Controlled Substance" medicine? No  LOV: 03/17/2022  What is the name of the medication or equipment? carvedilol (COREG) 25 MG tablet   Have you contacted your pharmacy to request a refill? YES   Which pharmacy would you like this sent to?  [facility] [address], [place] - [address] [place] [address] Phone: [phone] Fax: [phone]     Patient notified that their request is being sent to the clinical staff for review and that they should receive a response within 2 business days.   Please advise at [facility] [phone]

## 2022-06-18 NOTE — Telephone Encounter (Signed)
Rx sent 

## 2022-06-18 NOTE — Addendum Note (Signed)
Addended by: Maximino Sarin on: 06/18/2022 02:57 PM   Modules accepted: Orders

## 2022-07-09 ENCOUNTER — Other Ambulatory Visit: Payer: Self-pay | Admitting: Medical

## 2022-07-09 NOTE — Addendum Note (Signed)
Addended by: Maximino Sarin on: 07/09/2022 09:24 AM   Modules accepted: Orders

## 2022-07-09 NOTE — Telephone Encounter (Signed)
Requesting: oxycodone Contract:03/24/22 UDS:08/21/21 Last Visit:04/07/22 Next Visit:n/a Last Refill:05/19/22  Please Advise

## 2022-07-09 NOTE — Telephone Encounter (Signed)
Prescription Request  07/09/2022  Is this a "Controlled Substance" medicine? Yes  LOV: 03/17/2022  What is the name of the medication or equipment?   Oxycodone HCl 10 MG TABS [[id number]]   promethazine (PHENERGAN) 12.5 MG tablet [[id number]]   Have you contacted your pharmacy to request a refill? No   Which pharmacy would you like this sent to?   [facility] [address], [place] - [address] [place] [address] Phone: [phone] Fax: [phone]  Patient notified that their request is being sent to the clinical staff for review and that they should receive a response within 2 business days.   Please advise at Mobile [phone] (mobile)

## 2022-07-12 ENCOUNTER — Telehealth: Payer: Self-pay | Admitting: Medical

## 2022-07-12 MED ORDER — PROMETHAZINE HCL 12.5 MG PO TABS: 12.5000 mg | ORAL_TABLET | Freq: Three times a day (TID) | ORAL | 1 refills | Status: DC | PRN

## 2022-07-12 NOTE — Telephone Encounter (Signed)
Need to update controlled med contrat and uds before I can refilled oxycodone. I had asked her to do that in [place]. See my last note.

## 2022-07-12 NOTE — Addendum Note (Signed)
Addended by: Gwenevere Abbot on: 07/12/2022 03:27 PM   Modules accepted: Orders

## 2022-07-14 ENCOUNTER — Other Ambulatory Visit: Payer: Self-pay

## 2022-07-14 DIAGNOSIS — Z79899 Other long term (current) drug therapy: Secondary | ICD-10-CM

## 2022-07-14 NOTE — Addendum Note (Signed)
Addended by: Maximino Sarin on: 07/14/2022 08:23 AM   Modules accepted: Orders

## 2022-07-14 NOTE — Telephone Encounter (Signed)
UDS ordered , contract was updated 03/2022

## 2022-07-17 ENCOUNTER — Telehealth: Payer: Self-pay | Admitting: *Deleted

## 2022-07-17 NOTE — Telephone Encounter (Signed)
Caller Name Aleecia Eckholm Caller Phone Number [phone] Patient Name [name] Patient DOB 07/07/1965 Call Type Message Only Information Provided Reason for Call Request to Schedule Office Appointment Initial Comment Caller states she has an appt but she will out of town. Disp. Time Disposition Final User 07/17/2022 12:27:30 PM General Information Provided Yes Gean Maidens

## 2022-07-18 ENCOUNTER — Other Ambulatory Visit: Payer: Commercial Managed Care - HMO

## 2022-07-18 NOTE — Telephone Encounter (Signed)
Lab appt cancelled 

## 2022-07-25 ENCOUNTER — Telehealth: Payer: Self-pay

## 2022-07-25 NOTE — Telephone Encounter (Signed)
Appt reminder

## 2022-07-25 NOTE — Telephone Encounter (Signed)
Caller Name Shantrice Kara Patient Name [name] Patient DOB 1965/04/02 Call Type Message Only Information Provided Reason for Call Returning a Call from the Office Initial Comment Caller states she is returning a call from the office. Disp. Time Disposition Final User 07/25/2022 11:52:23 AM General Information Provided Yes Day, Samantha Call Closed By: Lelon Mast Day Transaction Date/Time: 07/25/2022 11:50:36 AM (ET)

## 2022-07-26 ENCOUNTER — Other Ambulatory Visit: Payer: Self-pay | Admitting: Medical

## 2022-07-27 ENCOUNTER — Other Ambulatory Visit: Payer: Self-pay

## 2022-07-27 ENCOUNTER — Encounter (HOSPITAL_BASED_OUTPATIENT_CLINIC_OR_DEPARTMENT_OTHER): Payer: Self-pay | Admitting: Emergency Medicine

## 2022-07-27 ENCOUNTER — Emergency Department (HOSPITAL_BASED_OUTPATIENT_CLINIC_OR_DEPARTMENT_OTHER)
Admission: EM | Admit: 2022-07-27 | Discharge: 2022-07-27 | Disposition: A | Discharge status: Home / Self Care | Payer: Commercial Managed Care - HMO | Attending: Emergency Medicine | Admitting: Emergency Medicine

## 2022-07-27 DIAGNOSIS — R197 Diarrhea, unspecified: Secondary | ICD-10-CM

## 2022-07-27 DIAGNOSIS — F17200 Nicotine dependence, unspecified, uncomplicated: Secondary | ICD-10-CM | POA: Diagnosis not present

## 2022-07-27 DIAGNOSIS — E1042 Type 1 diabetes mellitus with diabetic polyneuropathy: Secondary | ICD-10-CM | POA: Diagnosis not present

## 2022-07-27 DIAGNOSIS — Z20822 Contact with and (suspected) exposure to covid-19: Secondary | ICD-10-CM | POA: Insufficient documentation

## 2022-07-27 DIAGNOSIS — E1022 Type 1 diabetes mellitus with diabetic chronic kidney disease: Secondary | ICD-10-CM | POA: Diagnosis not present

## 2022-07-27 DIAGNOSIS — Z8541 Personal history of malignant neoplasm of cervix uteri: Secondary | ICD-10-CM | POA: Diagnosis not present

## 2022-07-27 DIAGNOSIS — I2510 Atherosclerotic heart disease of native coronary artery without angina pectoris: Secondary | ICD-10-CM | POA: Insufficient documentation

## 2022-07-27 DIAGNOSIS — N183 Chronic kidney disease, stage 3 unspecified: Secondary | ICD-10-CM | POA: Insufficient documentation

## 2022-07-27 DIAGNOSIS — R112 Nausea with vomiting, unspecified: Secondary | ICD-10-CM | POA: Insufficient documentation

## 2022-07-27 DIAGNOSIS — I129 Hypertensive chronic kidney disease with stage 1 through stage 4 chronic kidney disease, or unspecified chronic kidney disease: Secondary | ICD-10-CM | POA: Diagnosis not present

## 2022-07-27 LAB — CBC WITH DIFFERENTIAL/PLATELET
Abs Immature Granulocytes: 0.01 10*3/uL (ref 0.00–0.07)
Basophils Absolute: 0 10*3/uL (ref 0.0–0.1)
Basophils Relative: 0 %
Eosinophils Absolute: 0.3 10*3/uL (ref 0.0–0.5)
Eosinophils Relative: 4 %
HCT: 39.1 % (ref 36.0–46.0)
Hemoglobin: 12.6 g/dL (ref 12.0–15.0)
Immature Granulocytes: 0 %
Lymphocytes Relative: 26 %
Lymphs Abs: 1.7 10*3/uL (ref 0.7–4.0)
MCH: 28.8 pg (ref 26.0–34.0)
MCHC: 32.2 g/dL (ref 30.0–36.0)
MCV: 89.5 fL (ref 80.0–100.0)
Monocytes Absolute: 0.6 10*3/uL (ref 0.1–1.0)
Monocytes Relative: 10 %
Neutro Abs: 3.9 10*3/uL (ref 1.7–7.7)
Neutrophils Relative %: 60 %
Platelets: 188 10*3/uL (ref 150–400)
RBC: 4.37 MIL/uL (ref 3.87–5.11)
RDW: 15.4 % (ref 11.5–15.5)
WBC: 6.5 10*3/uL (ref 4.0–10.5)
nRBC: 0 % (ref 0.0–0.2)

## 2022-07-27 LAB — COMPREHENSIVE METABOLIC PANEL
ALT: 16 U/L (ref 0–44)
AST: 18 U/L (ref 15–41)
Albumin: 3.5 g/dL (ref 3.5–5.0)
Alkaline Phosphatase: 99 U/L (ref 38–126)
Anion gap: 10 (ref 5–15)
BUN: 26 mg/dL — ABNORMAL HIGH (ref 6–20)
CO2: 24 mmol/L (ref 22–32)
Calcium: 8.6 mg/dL — ABNORMAL LOW (ref 8.9–10.3)
Chloride: 103 mmol/L (ref 98–111)
Creatinine, Ser: 1.56 mg/dL — ABNORMAL HIGH (ref 0.44–1.00)
GFR, Estimated: 39 mL/min — ABNORMAL LOW (ref >60)
Glucose, Bld: 76 mg/dL (ref 70–99)
Potassium: 3.6 mmol/L (ref 3.5–5.1)
Sodium: 137 mmol/L (ref 135–145)
Total Bilirubin: 0.5 mg/dL (ref 0.3–1.2)
Total Protein: 7.7 g/dL (ref 6.5–8.1)

## 2022-07-27 LAB — URINALYSIS, W/ REFLEX TO CULTURE (INFECTION SUSPECTED)
Bilirubin Urine: NEGATIVE
Glucose, UA: NEGATIVE mg/dL
Hgb urine dipstick: NEGATIVE
Ketones, ur: 15 mg/dL — AB
Nitrite: NEGATIVE
Protein, ur: 100 mg/dL — AB
RBC / HPF: NONE SEEN RBC/hpf (ref 0–5)
Specific Gravity, Urine: 1.025 (ref 1.005–1.030)
pH: 5.5 (ref 5.0–8.0)

## 2022-07-27 LAB — LIPASE, BLOOD: Lipase: 36 U/L (ref 11–51)

## 2022-07-27 LAB — SARS CORONAVIRUS 2 BY RT PCR: SARS Coronavirus 2 by RT PCR: NEGATIVE

## 2022-07-27 MED ORDER — SODIUM CHLORIDE 0.9 % IV SOLN
12.5000 mg | Freq: Once | INTRAVENOUS | Status: AC
  Administered 2022-07-27: 12.5 mg via INTRAVENOUS
  Filled 2022-07-27: qty 0.5

## 2022-07-27 MED ORDER — DICYCLOMINE HCL 10 MG/ML IM SOLN
20.0000 mg | Freq: Once | INTRAMUSCULAR | Status: AC
  Administered 2022-07-27: 20 mg via INTRAMUSCULAR
  Filled 2022-07-27: qty 2

## 2022-07-27 MED ORDER — PROMETHAZINE HCL 25 MG/ML IJ SOLN
INTRAMUSCULAR | Status: AC
  Filled 2022-07-27: qty 1

## 2022-07-27 MED ORDER — ONDANSETRON 4 MG PO TBDP: 4.0000 mg | ORAL_TABLET | Freq: Three times a day (TID) | ORAL | 0 refills | Status: DC | PRN

## 2022-07-27 MED ORDER — SODIUM CHLORIDE 0.9 % IV BOLUS
1000.0000 mL | Freq: Once | INTRAVENOUS | Status: AC
  Administered 2022-07-27: 1000 mL via INTRAVENOUS

## 2022-07-27 NOTE — ED Provider Notes (Signed)
Emergency Department Provider Note   I have reviewed the triage vital signs and the nursing notes.   HISTORY  Chief Complaint Emesis and Diarrhea   HPI [name] is a 57 y.o. female past history reviewed below including IDDM presents to the emergency department for evaluation of nausea, vomiting, diarrhea.  Patient has associated abdominal pain.  Symptoms have been ongoing for the past 5 to 6 days.  No recorded fevers but has had bodyaches.  No chest pain or shortness of breath.  No cough or congestion.  No blood in the emesis or stool.  She is able to keep down fluids but if she tries to eat solids she has severe nausea, not controlled by her home Phenergan.  Her blood sugars have been running low normal.  Her abdominal discomfort is cramping type pain which is bandlike through the mid abdomen. Some pain radiating to the bilateral thighs.    Past Medical History:  Diagnosis Date   Anxiety 01/22/2016   Cardiac murmur 02/01/2019   Cardiomyopathy, secondary (HCC) 09/06/2009   Qualifier: Diagnosis of  By: Jolene Provost   Formatting of this note might be different from the original. Overview:  Qualifier: Diagnosis of  By: Jolene Provost   Chronic kidney disease, stage III (moderate) (HCC) 05/23/2014   Chronic pain syndrome 01/23/2016   Coronary artery disease    30% lesions noted   Depression    Diabetes type 2, uncontrolled 05/23/2014   Diabetic gastroparesis associated with type 1 diabetes mellitus (HCC) 11/27/2016   DIABETIC PERIPHERAL NEUROPATHY 09/21/2009   Qualifier: Diagnosis of  By: Delrae Alfred MD, [name]     Essential hypertension 08/30/2009   Qualifier: Diagnosis of  By: Cristela Felt, CNA, Christy     Gastroesophageal reflux disease 07/30/2009   Formatting of this note might be different from the original. Overview:  Overview:  Qualifier: Diagnosis of  By: Delrae Alfred MD, Alfonso Patten: Diagnosis of  By: Wilmon Pali NP, Consuela Mimes of  this note might be different from the original. Overview:  Qualifier: Diagnosis of  By: Delrae Alfred MD, Alfonso Patten: Diagnosis of  By: Wilmon Pali NP, Gunnar Fusi   GERD (gastroesophageal reflux disease)    History of cervical cancer 08/17/2015   Status post partial hysterectomy  Formatting of this note might be different from the original. Status post partial hysterectomy   Hyperlipidemia    INSOMNIA 09/21/2009   Qualifier: Diagnosis of  By: Delrae Alfred MD, [name]     Lumbar facet arthropathy 05/14/2016   Lumbar spinal stenosis 02/11/2018   Metatarsalgia of both feet 03/11/2017   Migraine 09/13/2012   Overview:  IMPRESSION: possible abd migraine   TOBACCO ABUSE 09/21/2009   Qualifier: Diagnosis of  By: Delrae Alfred MD, Beverely Low of this note might be different from the original. Overview:  Qualifier: Diagnosis of  By: Delrae Alfred MD, [name]   Trigeminal neuralgia    ULCER-GASTRIC 09/28/2009   Qualifier: Diagnosis of  By: Wilmon Pali NP, Leeann Must, CANDIDAL 12/25/2009   Qualifier: Diagnosis of  By: Delrae Alfred MD, Hubbard Robinson D deficiency 04/25/2013    Review of Systems  Constitutional: No fever/chills. Positive fatigue.  Cardiovascular: Denies chest pain. Respiratory: Denies shortness of breath. Gastrointestinal: Positive abdominal pain. Positive nausea and vomiting.  No diarrhea.  No constipation. Genitourinary: Negative for dysuria. Musculoskeletal: Negative for back pain. Skin: Negative for rash. Neurological: Negative for headache.   ____________________________________________   PHYSICAL EXAM:  VITAL SIGNS: ED Triage Vitals  Enc Vitals Group     BP 07/27/22 1305 111/87     Pulse Rate 07/27/22 1305 95     Resp 07/27/22 1305 16     Temp 07/27/22 1305 98 F (36.7 C)     Temp src --      SpO2 07/27/22 1305 99 %     Weight 07/27/22 1259 195 lb (88.5 kg)     Height 07/27/22 1259 6' (1.829 m)   Constitutional: Alert and oriented. Well appearing  and in no acute distress. Eyes: Conjunctivae are normal.  Head: Atraumatic. Nose: No congestion/rhinnorhea. Mouth/Throat: Mucous membranes are moist.   Neck: No stridor.  Cardiovascular: Normal rate, regular rhythm. Good peripheral circulation. Grossly normal heart sounds.   Respiratory: Normal respiratory effort.  No retractions. Lungs CTAB. Gastrointestinal: Soft with mild diffuse tenderness through the mid-abdomen. No focal tenderness or peritonitis. No distention.  Musculoskeletal: No lower extremity tenderness nor edema. No gross deformities of extremities. Neurologic:  Normal speech and language.  Skin:  Skin is warm, dry and intact. No rash noted.   ____________________________________________   LABS (all labs ordered are listed, but only abnormal results are displayed)  Labs Reviewed  SARS CORONAVIRUS 2 BY RT PCR  COMPREHENSIVE METABOLIC PANEL  LIPASE, BLOOD  CBC WITH DIFFERENTIAL/PLATELET  URINALYSIS, W/ REFLEX TO CULTURE (INFECTION SUSPECTED)   ____________________________________________  RADIOLOGY  No results found.  ____________________________________________   PROCEDURES  Procedure(s) performed:   Procedures   ____________________________________________   INITIAL IMPRESSION / ASSESSMENT AND PLAN / ED COURSE  Pertinent labs & imaging results that were available during my care of the patient were reviewed by me and considered in my medical decision making (see chart for details).   This patient is Presenting for Evaluation of abdominal pain, which does require a range of treatment options, and is a complaint that involves a high risk of morbidity and mortality.  The Differential Diagnoses includes but is not exclusive to acute cholecystitis, intrathoracic causes for epigastric abdominal pain, gastritis, duodenitis, pancreatitis, small bowel or large bowel obstruction, abdominal aortic aneurysm, hernia, gastritis, etc.   Critical Interventions-     Medications  sodium chloride 0.9 % bolus 1,000 mL (has no administration in time range)  promethazine (PHENERGAN) 12.5 mg in sodium chloride 0.9 % 50 mL IVPB (has no administration in time range)  dicyclomine (BENTYL) injection 20 mg (has no administration in time range)    Reassessment after intervention:  symptoms improved.    Clinical Laboratory Tests Ordered, included   Radiologic Tests: Considered CT abdomen/pelvis but exam is reassuring. Will reassess after labs but defer for now.   Cardiac Monitor Tracing which shows NSR.    Social Determinants of Health Risk patient is not an active smoker.   Consult complete with  Medical Decision Making: Summary:  Patient presents to the emergency department for evaluation of abdominal pain with nausea vomiting.  Largely nonfocal exam with reassuring vitals.  Plan for IV fluids, Phenergan, Bentyl along with labs.  Plan to reassess at that time to determine the need for abdominal imaging although do not feel is necessary at this point.  Reevaluation with update and discussion with   ***Considered admission***  Patient's presentation is most consistent with acute presentation with potential threat to life or bodily function.   Disposition:   ____________________________________________  FINAL CLINICAL IMPRESSION(S) / ED DIAGNOSES  Final diagnoses:  None     NEW OUTPATIENT MEDICATIONS STARTED DURING THIS VISIT:  New Prescriptions  No medications on file    Note:  This document was prepared using Dragon voice recognition software and may include unintentional dictation errors.  Alona Bene, MD, [facility]

## 2022-07-27 NOTE — ED Notes (Addendum)
Pt states that her  diabetic monitor is going off  he sugar is 67 , ok with dr Jacqulyn Bath for pt to have apple juice and peanut crackers pt given blankets and TV remote

## 2022-07-27 NOTE — ED Notes (Signed)
Pt states unable to void at this time. 

## 2022-07-27 NOTE — ED Notes (Signed)
Pt up to [facility] again states has to pee , she is trying for more urine

## 2022-07-27 NOTE — ED Provider Notes (Signed)
Patient feeling much better on reevaluation.  No evidence of UTI.  Able to tolerate p.o.  May be viral process.  Not having any abdominal pain.  No concern for intra-abdominal process at this time.  Return precautions given.  Discharged in good condition.  This chart was dictated using voice recognition software.  Despite best efforts to proofread,  errors can occur which can change the documentation meaning.    Virgina Norfolk, DO 07/27/22 1531

## 2022-07-27 NOTE — ED Triage Notes (Signed)
Pt w/ NVD since Monday; sts she was feeling near-syncopal yesterday; general lower abd pain

## 2022-07-28 ENCOUNTER — Emergency Department (HOSPITAL_COMMUNITY)
Admission: EM | Admit: 2022-07-28 | Discharge: 2022-07-28 | Disposition: A | Discharge status: Home / Self Care | Payer: Commercial Managed Care - HMO | Attending: Emergency Medicine | Admitting: Emergency Medicine

## 2022-07-28 ENCOUNTER — Encounter (HOSPITAL_COMMUNITY): Payer: Self-pay

## 2022-07-28 ENCOUNTER — Emergency Department (HOSPITAL_COMMUNITY): Payer: Commercial Managed Care - HMO

## 2022-07-28 ENCOUNTER — Other Ambulatory Visit: Payer: Self-pay

## 2022-07-28 ENCOUNTER — Ambulatory Visit: Payer: Commercial Managed Care - HMO | Admitting: Medical

## 2022-07-28 ENCOUNTER — Telehealth: Payer: Self-pay

## 2022-07-28 DIAGNOSIS — K529 Noninfective gastroenteritis and colitis, unspecified: Secondary | ICD-10-CM | POA: Diagnosis not present

## 2022-07-28 DIAGNOSIS — Z7982 Long term (current) use of aspirin: Secondary | ICD-10-CM | POA: Diagnosis not present

## 2022-07-28 DIAGNOSIS — Z7984 Long term (current) use of oral hypoglycemic drugs: Secondary | ICD-10-CM | POA: Diagnosis not present

## 2022-07-28 DIAGNOSIS — Z91040 Latex allergy status: Secondary | ICD-10-CM | POA: Insufficient documentation

## 2022-07-28 DIAGNOSIS — Z794 Long term (current) use of insulin: Secondary | ICD-10-CM | POA: Diagnosis not present

## 2022-07-28 DIAGNOSIS — R109 Unspecified abdominal pain: Secondary | ICD-10-CM | POA: Diagnosis present

## 2022-07-28 DIAGNOSIS — E119 Type 2 diabetes mellitus without complications: Secondary | ICD-10-CM | POA: Diagnosis not present

## 2022-07-28 LAB — COMPREHENSIVE METABOLIC PANEL
ALT: 16 U/L (ref 0–44)
AST: 17 U/L (ref 15–41)
Albumin: 3.6 g/dL (ref 3.5–5.0)
Alkaline Phosphatase: 95 U/L (ref 38–126)
Anion gap: 8 (ref 5–15)
BUN: 22 mg/dL — ABNORMAL HIGH (ref 6–20)
CO2: 25 mmol/L (ref 22–32)
Calcium: 8.9 mg/dL (ref 8.9–10.3)
Chloride: 107 mmol/L (ref 98–111)
Creatinine, Ser: 1.23 mg/dL — ABNORMAL HIGH (ref 0.44–1.00)
GFR, Estimated: 51 mL/min — ABNORMAL LOW (ref >60)
Glucose, Bld: 149 mg/dL — ABNORMAL HIGH (ref 70–99)
Potassium: 4.1 mmol/L (ref 3.5–5.1)
Sodium: 140 mmol/L (ref 135–145)
Total Bilirubin: 0.6 mg/dL (ref 0.3–1.2)
Total Protein: 8.2 g/dL — ABNORMAL HIGH (ref 6.5–8.1)

## 2022-07-28 LAB — CBC
HCT: 40.7 % (ref 36.0–46.0)
Hemoglobin: 13.2 g/dL (ref 12.0–15.0)
MCH: 29.1 pg (ref 26.0–34.0)
MCHC: 32.4 g/dL (ref 30.0–36.0)
MCV: 89.8 fL (ref 80.0–100.0)
Platelets: 264 10*3/uL (ref 150–400)
RBC: 4.53 MIL/uL (ref 3.87–5.11)
RDW: 14.6 % (ref 11.5–15.5)
WBC: 8.2 10*3/uL (ref 4.0–10.5)
nRBC: 0 % (ref 0.0–0.2)

## 2022-07-28 LAB — URINALYSIS, ROUTINE W REFLEX MICROSCOPIC
Bilirubin Urine: NEGATIVE
Glucose, UA: NEGATIVE mg/dL
Hgb urine dipstick: NEGATIVE
Ketones, ur: NEGATIVE mg/dL
Nitrite: NEGATIVE
Protein, ur: 30 mg/dL — AB
Specific Gravity, Urine: 1.015 (ref 1.005–1.030)
pH: 5 (ref 5.0–8.0)

## 2022-07-28 LAB — TROPONIN I (HIGH SENSITIVITY)
Troponin I (High Sensitivity): 3 ng/L (ref <18)
Troponin I (High Sensitivity): 3 ng/L (ref <18)

## 2022-07-28 LAB — LIPASE, BLOOD: Lipase: 38 U/L (ref 11–51)

## 2022-07-28 MED ORDER — FENTANYL CITRATE PF 50 MCG/ML IJ SOSY
50.0000 ug | PREFILLED_SYRINGE | Freq: Once | INTRAMUSCULAR | Status: AC
  Administered 2022-07-28: 50 ug via INTRAVENOUS
  Filled 2022-07-28: qty 1

## 2022-07-28 MED ORDER — KETOROLAC TROMETHAMINE 15 MG/ML IJ SOLN
15.0000 mg | Freq: Once | INTRAMUSCULAR | Status: AC
  Administered 2022-07-28: 15 mg via INTRAVENOUS
  Filled 2022-07-28: qty 1

## 2022-07-28 MED ORDER — AMOXICILLIN-POT CLAVULANATE 875-125 MG PO TABS
1.0000 | ORAL_TABLET | Freq: Once | ORAL | Status: AC
  Administered 2022-07-28: 1 via ORAL
  Filled 2022-07-28: qty 1

## 2022-07-28 MED ORDER — DROPERIDOL 2.5 MG/ML IJ SOLN
1.2500 mg | Freq: Once | INTRAMUSCULAR | Status: AC
  Administered 2022-07-28: 1.25 mg via INTRAVENOUS
  Filled 2022-07-28: qty 2

## 2022-07-28 MED ORDER — ONDANSETRON HCL 4 MG/2ML IJ SOLN
4.0000 mg | Freq: Once | INTRAMUSCULAR | Status: DC
  Filled 2022-07-28: qty 2

## 2022-07-28 MED ORDER — SODIUM CHLORIDE 0.9 % IV BOLUS
1000.0000 mL | Freq: Once | INTRAVENOUS | Status: AC
  Administered 2022-07-28: 1000 mL via INTRAVENOUS

## 2022-07-28 MED ORDER — AMOXICILLIN-POT CLAVULANATE 875-125 MG PO TABS: 1.0000 | ORAL_TABLET | Freq: Two times a day (BID) | ORAL | 0 refills | Status: DC

## 2022-07-28 MED ORDER — PROMETHAZINE HCL 12.5 MG PO TABS: 12.5000 mg | ORAL_TABLET | Freq: Three times a day (TID) | ORAL | 0 refills | Status: DC | PRN

## 2022-07-28 MED ORDER — DICYCLOMINE HCL 20 MG PO TABS: 20.0000 mg | ORAL_TABLET | Freq: Two times a day (BID) | ORAL | 0 refills | Status: DC

## 2022-07-28 MED ORDER — IOHEXOL 350 MG/ML SOLN
100.0000 mL | Freq: Once | INTRAVENOUS | Status: AC | PRN
  Administered 2022-07-28: 100 mL via INTRAVENOUS

## 2022-07-28 NOTE — Telephone Encounter (Signed)
Appt later today.

## 2022-07-28 NOTE — ED Provider Notes (Signed)
[place] EMERGENCY DEPARTMENT AT [facility] Provider Note   CSN: [id number] Arrival date & time: 07/28/22  0158     History  Chief Complaint  Patient presents with   Abdominal Pain    CHAKERA VENTURO is a 57 y.o. female.  Here complaining of 5 days of abdominal pain with nausea vomiting and diarrhea. Denies any hematemesis hematochezia, no melena. States diarrhea is multiple times a day, reports he is able to tolerate fluids but is only able to eat a couple of bites of food and will vomit. She denies ever having symptoms like this past, was seen earlier today and pain was more diffuse in the abdomen, she is after leaving and is now become more localized to the right lower quadrant, she also notes she is developed chest pain on the right side and feels like it is "pulling" and feels like it is connected to her abdominal pain. Does have history of diabetes notes she recently started ozempic 3 weeks ago.  No leg pain or swelling, no shortness of breath, no fevers or chills, no cough.  No pleuritic pain.  No history of DVT or PE but does have history of heart disease    Abdominal Pain      Home Medications Prior to Admission medications   Medication Sig Start Date End Date Taking? Authorizing Provider  albuterol (VENTOLIN HFA) 108 (90 Base) MCG/ACT inhaler Inhale 2 puffs into the lungs every 6 (six) hours as needed for wheezing or shortness of breath. Patient taking differently: Inhale 2 puffs into the lungs 3 (three) times daily as needed for wheezing or shortness of breath. 08/03/20  Yes Saguier, Ramon Dredge, PA-C  amLODipine (NORVASC) 5 MG tablet Take 1 tablet (5 mg total) by mouth daily. 01/13/22 01/08/23 Yes Saguier, Ramon Dredge, PA-C  amoxicillin-clavulanate (AUGMENTIN) 875-125 MG tablet Take 1 tablet by mouth every 12 (twelve) hours. 07/28/22  Yes Solange Emry A, PA-C  aspirin 81 MG tablet Take 81 mg by mouth daily.   Yes [provider]  atorvastatin  (LIPITOR) 80 MG tablet Take 1 tablet (80 mg total) by mouth daily. 01/13/22  Yes Saguier, Ramon Dredge, PA-C  busPIRone (BUSPAR) 15 MG tablet Take 1 tablet (15 mg total) by mouth 2 (two) times daily. 01/13/22  Yes Saguier, Ramon Dredge, PA-C  carvedilol (COREG) 25 MG tablet Take 1 tablet (25 mg total) by mouth 2 (two) times daily with a meal. 06/18/22  Yes Saguier, Ramon Dredge, PA-C  dicyclomine (BENTYL) 20 MG tablet Take 1 tablet (20 mg total) by mouth 2 (two) times daily. 07/28/22  Yes Berma Harts A, PA-C  fluticasone (FLONASE) 50 MCG/ACT nasal spray Place 2 sprays into both nostrils daily. 01/13/22  Yes Saguier, Ramon Dredge, PA-C  fluticasone furoate-vilanterol (BREO ELLIPTA) 100-25 MCG/INH AEPB Inhale 1 puff into the lungs daily. 08/03/20  Yes Saguier, Ramon Dredge, PA-C  gabapentin (NEURONTIN) 300 MG capsule Take 2 capsules (600 mg total) by mouth 3 (three) times daily. 05/23/22  Yes Saguier, Ramon Dredge, PA-C  Insulin Glargine (BASAGLAR KWIKPEN) 100 UNIT/ML Inject 40 Units into the skin at bedtime. 03/28/22  Yes Saguier, Ramon Dredge, PA-C  metFORMIN (GLUCOPHAGE-XR) 500 MG 24 hr tablet Take 1 tablet (500 mg total) by mouth daily with breakfast. 01/22/22  Yes Saguier, Ramon Dredge, PA-C  methocarbamol (ROBAXIN) 750 MG tablet Take 1 tablet (750 mg total) by mouth every 8 (eight) hours as needed for muscle spasms. 05/19/22  Yes Saguier, Ramon Dredge, PA-C  nitroGLYCERIN (NITROSTAT) 0.4 MG SL tablet Place 1 tablet (0.4 mg total) under  the tongue every 5 (five) minutes as needed for chest pain. 03/21/21  Yes Saguier, Ramon Dredge, PA-C  Oxycodone HCl 10 MG TABS Take 1 tablet (10 mg total) by mouth every 6 (six) hours as needed. 05/19/22  Yes Saguier, Ramon Dredge, PA-C  Semaglutide (OZEMPIC, 0.25 OR 0.5 MG/DOSE, [place]) Inject 0.25 mg into the skin once a week. mondays   Yes [provider]  topiramate (TOPAMAX) 50 MG tablet Take 1 tablet (50 mg total) by mouth 2 (two) times daily. 01/13/22  Yes Saguier, Ramon Dredge, PA-C  VITAMIN D PO Take 1 capsule by mouth daily.   Yes  [provider]  Insulin Pen Needle (PEN NEEDLES 31GX5/16") 31G X 8 MM MISC Use daily as directed 03/28/22   Saguier, Ramon Dredge, PA-C  methylPREDNISolone (MEDROL DOSEPAK) 4 MG TBPK tablet Follow instructions on package. Patient not taking: Reported on 07/28/2022 04/07/22   Sharlene Dory, DO  mupirocin ointment (BACTROBAN) 2 % Apply 1 Application topically 2 (two) times daily. Apply to the affected area 2 times a day Patient not taking: Reported on 07/28/2022 01/23/22   Nadara Mustard, MD  ondansetron (ZOFRAN-ODT) 4 MG disintegrating tablet Take 1 tablet (4 mg total) by mouth every 8 (eight) hours as needed. Patient not taking: Reported on 07/28/2022 07/27/22   Virgina Norfolk, DO  promethazine (PHENERGAN) 12.5 MG tablet Take 1 tablet (12.5 mg total) by mouth every 8 (eight) hours as needed for nausea or vomiting. 07/28/22   Carmel Sacramento A, PA-C      Allergies    Lisinopril, Oxycodone-acetaminophen, Insulin aspart, Latex, and Morphine    Review of Systems   Review of Systems  Gastrointestinal:  Positive for abdominal pain.    Physical Exam Updated Vital Signs BP 121/76   Pulse 89   Temp 98.8 F (37.1 C) (Oral)   Resp 20   SpO2 94%  Physical Exam  ED Results / Procedures / Treatments   Labs (all labs ordered are listed, but only abnormal results are displayed) Labs Reviewed  COMPREHENSIVE METABOLIC PANEL - Abnormal; Notable for the following components:      Result Value   Glucose, Bld 149 (*)    BUN 22 (*)    Creatinine, Ser 1.23 (*)    Total Protein 8.2 (*)    GFR, Estimated 51 (*)    All other components within normal limits  URINALYSIS, ROUTINE W REFLEX MICROSCOPIC - Abnormal; Notable for the following components:   Protein, ur 30 (*)    Leukocytes,Ua TRACE (*)    Bacteria, UA MANY (*)    All other components within normal limits  LIPASE, BLOOD  CBC  TROPONIN I (HIGH SENSITIVITY)  TROPONIN I (HIGH SENSITIVITY)    EKG EKG  Interpretation  Date/Time:  Monday July 28 2022 03:41:38 EDT Ventricular Rate:  101 PR Interval:  168 QRS Duration: 79 QT Interval:  347 QTC Calculation: 450 R Axis:   36 Text Interpretation: Sinus tachycardia Low voltage, precordial leads No significant change was found Confirmed by Glynn Octave [phone]) on 07/28/2022 3:43:51 AM  Radiology CT ABDOMEN PELVIS W CONTRAST  Result Date: 07/28/2022 CLINICAL DATA:  57 year old female with right chest and abdominal pain, nausea and diarrhea. EXAM: CT ABDOMEN AND PELVIS WITH CONTRAST TECHNIQUE: Multidetector CT imaging of the abdomen and pelvis was performed using the standard protocol following bolus administration of intravenous contrast. RADIATION DOSE REDUCTION: This exam was performed according to the departmental dose-optimization program which includes automated exposure control, adjustment of the mA and/or kV according  to patient size and/or use of iterative reconstruction technique. CONTRAST:  OMNIPAQUE IOHEXOL 350 MG/ML SOLN COMPARISON:  CTA chest today.  CT Abdomen and Pelvis 01/17/2021. FINDINGS: Lower chest: Trace layering pleural effusions and dependent ground-glass opacity most resembling atelectasis, stable from CTA reported separately today. Hepatobiliary: Chronic cholecystectomy.  Negative liver. Pancreas: Negative. Spleen: Negative. Adrenals/Urinary Tract: Negative kidneys and adrenal glands. Symmetric renal enhancement and contrast excretion. Diminutive bladder. Numerous chronic pelvic phleboliths. Stomach/Bowel: Fluid-filled nondilated large bowel. Fluid to the level of the rectum and generalized mucosal thickening and hyperenhancement in the large bowel, most pronounced in the rectosigmoid colon (series 2, image 75). The appendix appears to remain normal, contains gas on series 2, image 66. Terminal ileum and distal small bowel are decompressed, but questionable generalized small bowel mucosal hyperenhancement. Retained food in  the stomach. Duodenum is decompressed. No free air. No free fluid or confluent mesenteric inflammation identified in the abdomen. Vascular/Lymphatic: Moderate Aortoiliac calcified atherosclerosis. Major arterial structures remain patent. Normal caliber abdominal aorta. Portal venous system is patent. Numerous surgical clips along the bilateral iliac vasculature suggest previous pelvic node dissection. No lymphadenopathy identified. Reproductive: Absent uterus.  Diminutive or absent ovaries. Other: Trace free fluid in the pelvis. Numerous superimposed chronic pelvic phleboliths. Musculoskeletal: Lower thoracic spine, lower lumbar spine degeneration with vacuum disc and endplate changes. No acute or suspicious osseous lesion. IMPRESSION: 1. Evidence of generalized Acute Colitis Versus Enterocolitis. No significant bowel wall thickening but generalized mucosal hyperenhancement, especially in the large bowel which contains fluid to the rectum in keeping with Enteritis/Diarrhea. Trace free fluid in the pelvis is likely reactive. 2. No bowel obstruction or free air.  Appendix remains normal. 3. Aortic Atherosclerosis (ICD10-I70.0). Postoperative changes in the bilateral pelvis, perhaps previous no dissection. Electronically Signed   By: Odessa Fleming M.D.   On: 07/28/2022 05:46   CT Angio Chest PE W and/or Wo Contrast  Result Date: 07/28/2022 CLINICAL DATA:  57 year old female with right chest and abdominal pain, nausea and diarrhea. EXAM: CT ANGIOGRAPHY CHEST WITH CONTRAST TECHNIQUE: Multidetector CT imaging of the chest was performed using the standard protocol during bolus administration of intravenous contrast. Multiplanar CT image reconstructions and MIPs were obtained to evaluate the vascular anatomy. RADIATION DOSE REDUCTION: This exam was performed according to the departmental dose-optimization program which includes automated exposure control, adjustment of the mA and/or kV according to patient size and/or use of  iterative reconstruction technique. CONTRAST:  OMNIPAQUE IOHEXOL 350 MG/ML SOLN COMPARISON:  Portable chest 0417 hours today. CT Abdomen and Pelvis reported separately. Prior chest CT 07/20/2009. FINDINGS: Cardiovascular: Excellent contrast bolus timing in the pulmonary arterial tree. No pulmonary artery filling defect identified. Heart size remains normal. No pericardial effusion. Normal thoracic aorta. Mediastinum/Nodes: Negative. No mediastinal mass or lymphadenopathy. Lungs/Pleura: Major airways are patent. Mildly lower lung volumes compared to 2011. Dependent pulmonary ground-glass opacity superimposed on trace pleural effusions most resembles atelectasis. No consolidation. And otherwise the lungs are clear. Upper Abdomen: Reported separately. Musculoskeletal: Advanced lower thoracic disc and endplate degeneration, multilevel mild vacuum disc. No acute osseous abnormality identified. Review of the MIP images confirms the above findings. IMPRESSION: 1. Negative for pulmonary embolus. 2. Trace layering pleural effusions with mild atelectasis. But otherwise negative CTA appearance of the Chest. 3. CT Abdomen and Pelvis reported separately. Electronically Signed   By: Odessa Fleming M.D.   On: 07/28/2022 05:31   DG Chest 1 View  Result Date: 07/28/2022 CLINICAL DATA:  57 year old female with right chest and  abdominal pain, nausea and diarrhea. EXAM: CHEST  1 VIEW COMPARISON:  Chest radiographs 03/31/2022 and earlier. FINDINGS: Portable AP semi upright view at 0417 hours. Lower lung volumes. Mediastinal contours remain normal. Visualized tracheal air column is within normal limits. Allowing for portable technique the lungs are clear. No pneumothorax or pleural effusion. Negative visible bowel gas, osseous structures. IMPRESSION: Negative portable chest. Electronically Signed   By: Odessa Fleming M.D.   On: 07/28/2022 04:45    Procedures Procedures    Medications Ordered in ED Medications  ondansetron (ZOFRAN)  injection 4 mg (4 mg Intravenous Patient Refused/Not Given 07/28/22 0453)  iohexol (OMNIPAQUE) 350 MG/ML injection 100 mL (100 mLs Intravenous Contrast Given 07/28/22 0503)  fentaNYL (SUBLIMAZE) injection 50 mcg (50 mcg Intravenous Given 07/28/22 0446)  droperidol (INAPSINE) 2.5 MG/ML injection 1.25 mg (1.25 mg Intravenous Given 07/28/22 0522)  sodium chloride 0.9 % bolus 1,000 mL (1,000 mLs Intravenous New Bag/Given 07/28/22 0611)  ketorolac (TORADOL) 15 MG/ML injection 15 mg (15 mg Intravenous Given 07/28/22 0611)  amoxicillin-clavulanate (AUGMENTIN) 875-125 MG per tablet 1 tablet (1 tablet Oral Given 07/28/22 1610)    ED Course/ Medical Decision Making/ A&P                             Medical Decision Making This patient presents to the ED for concern of right-sided abdominal pain and chest pain, this involves an extensive number of treatment options, and is a complaint that carries with it a high risk of complications and morbidity.  The differential diagnosis includes enteritis, colitis, diverticulitis, appendicitis, inflammatory bowel disease, mesenteric adenitis, ACS, PE, pneumonia, other   Co morbidities that complicate the patient evaluation :   Diabetes, heart disease   Additional history obtained:  Additional history obtained from EMR External records from outside source obtained and reviewed including notes, labs   Lab Tests:  I Ordered, and personally interpreted labs.  The pertinent results include: BUN and creatinine improved from results in the past 24 hours, CBC is normal, lipase is normal, troponin normal, urine does show 11-20 white blood cells and many bacteria but patient has no urinary symptoms,   Imaging Studies ordered:  I ordered imaging studies including CTA chest and CT abdomen pelvis I independently visualized and interpreted imaging which showed pleural effusions, no PE, no other acute intrathoracic findings, CT abdomen pelvis shows enterocolitis I agree with  the radiologist interpretation   Cardiac Monitoring: / EKG:  The patient was maintained on a cardiac monitor.  I personally viewed and interpreted the cardiac monitored which showed an underlying rhythm of: Sinus rhythm    Problem List / ED Course / Critical interventions / Medication management  Enterocolitis-patient has been having about 6 days of nausea and vomiting and diarrhea.  Able to keep some liquids down but states eating food makes her have worsening pain and vomiting.  No blood in the stools.  She was seen earlier today and was feeling somewhat better and did not have any imaging but got home and pain became worse and migrated more towards her right lower quadrant.  Patient started having some chest pain on the right side.  Given her tachycardia and discomfort, I did a CTA of the chest to rule out PE as well as CT abdomen pelvis to evaluate her abdominal pain and diarrhea. As above CT chest shows trace pleural effusions but no other acute findings, CT abdomen pelvis shows likely enterocolitis.  Given the  patient's symptoms have been persistent for this long and seem to be getting worse we will treat with antibiotics at this time.  Discussed with patient could be viral, inflammatory or bacterial.  Discussed she will need to follow-up with GI.  She did start Ozempic in the past several weeks and discussed that sometimes this can cause stomach upset and diarrhea so she should contact her doctor before continuing this. She is comfortable going home at this time.  Requested prescription for Phenergan for home as well to work note I ordered medication including fentanyl and Toradol for abdominal pain, droperidol for nausea Reevaluation of the patient after these medicines showed that the patient improved I have reviewed the patients home medicines and have made adjustments as needed  Offered her an GI panel in the ED but patient unable to provide stool sample No recent antibiotics, no  complaining of very foul-smelling stool, no leukocytosis, low suspicion for C. difficile at this time though cannot perform testing due to lack of stool sample      Amount and/or Complexity of Data Reviewed Labs: ordered. Radiology: ordered.  Risk Prescription drug management.           Final Clinical Impression(s) / ED Diagnoses Final diagnoses:  Colitis    Rx / DC Orders ED Discharge Orders          Ordered    dicyclomine (BENTYL) 20 MG tablet  2 times daily        07/28/22 0622    amoxicillin-clavulanate (AUGMENTIN) 875-125 MG tablet  Every 12 hours        07/28/22 0622    promethazine (PHENERGAN) 12.5 MG tablet  Every 8 hours PRN        07/28/22 1610              Ma Rings, PA-C 07/28/22 0708    Glynn Octave, MD 07/28/22 [phone]

## 2022-07-28 NOTE — Discharge Instructions (Signed)
Taking care of you today.  Your test for your heart were reassuring.  Your CT scan of your chest did not show any blood clots he had very small amount of fluid in your lungs called pleural effusions).  Follow-up with your primary care doctor for this.  CT scan did show inflammation of your intestines.  Given this and ongoing and this seems to be getting somewhat worse we will treat you with antibiotics for this.  Is important you follow-up with your primary care doctor and GI.  I recommended talking to your doctor before restarting your Ozempic as this can cause stomach upset as well.

## 2022-07-28 NOTE — ED Triage Notes (Signed)
Pt. BIB [facility] for R. Lower quadrant abdominal pain that's started Tuesday. Pt. Has had nausea and diarrhea, but no vomiting. Pt. Seen yesterday for the same.

## 2022-07-29 ENCOUNTER — Encounter: Payer: Self-pay | Admitting: Family Medicine

## 2022-07-29 ENCOUNTER — Telehealth (INDEPENDENT_AMBULATORY_CARE_PROVIDER_SITE_OTHER): Payer: Commercial Managed Care - HMO | Admitting: Family Medicine

## 2022-07-29 DIAGNOSIS — K529 Noninfective gastroenteritis and colitis, unspecified: Secondary | ICD-10-CM | POA: Diagnosis not present

## 2022-07-29 MED ORDER — OXYCODONE HCL 10 MG PO TABS
10.0000 mg | ORAL_TABLET | Freq: Four times a day (QID) | ORAL | 0 refills | Status: DC | PRN
Start: 2022-07-29 — End: 2022-08-20

## 2022-07-29 NOTE — Progress Notes (Signed)
Chief Complaint  Patient presents with   Covid Positive    [name] here for GI complaints. We are interacting via web portal for an electronic face-to-face visit. I verified patient's ID using 2 identifiers. Patient agreed to proceed with visit via this method. Patient is at home, I am at office. Patient and I are present for visit.   Duration: 1 week  Associated symptoms:  nausea, low grade fevers, diarrhea, abd pain radiating to back Denies:  abd pain Treatment to date: Phenergan, Augmentin, Bentyl Sick contacts: No CT abd/pelvis showed colitis.   Past Medical History:  Diagnosis Date   Anxiety 01/22/2016   Cardiac murmur 02/01/2019   Cardiomyopathy, secondary (HCC) 09/06/2009   Qualifier: Diagnosis of  By: Jolene Provost   Formatting of this note might be different from the original. Overview:  Qualifier: Diagnosis of  By: Jolene Provost   Chronic kidney disease, stage III (moderate) (HCC) 05/23/2014   Chronic pain syndrome 01/23/2016   Coronary artery disease    30% lesions noted   Depression    Diabetes type 2, uncontrolled 05/23/2014   Diabetic gastroparesis associated with type 1 diabetes mellitus (HCC) 11/27/2016   DIABETIC PERIPHERAL NEUROPATHY 09/21/2009   Qualifier: Diagnosis of  By: Delrae Alfred MD, [name]     Essential hypertension 08/30/2009   Qualifier: Diagnosis of  By: Cristela Felt, CNA, Christy     Gastroesophageal reflux disease 07/30/2009   Formatting of this note might be different from the original. Overview:  Overview:  Qualifier: Diagnosis of  By: Delrae Alfred MD, Alfonso Patten: Diagnosis of  By: Wilmon Pali NP, Consuela Mimes of this note might be different from the original. Overview:  Qualifier: Diagnosis of  By: Delrae Alfred MD, Alfonso Patten: Diagnosis of  By: Wilmon Pali NP, Gunnar Fusi   GERD (gastroesophageal reflux disease)    History of cervical cancer 08/17/2015   Status post partial hysterectomy  Formatting of this  note might be different from the original. Status post partial hysterectomy   Hyperlipidemia    INSOMNIA 09/21/2009   Qualifier: Diagnosis of  By: Delrae Alfred MD, [name]     Lumbar facet arthropathy 05/14/2016   Lumbar spinal stenosis 02/11/2018   Metatarsalgia of both feet 03/11/2017   Migraine 09/13/2012   Overview:  IMPRESSION: possible abd migraine   TOBACCO ABUSE 09/21/2009   Qualifier: Diagnosis of  By: Delrae Alfred MD, Beverely Low of this note might be different from the original. Overview:  Qualifier: Diagnosis of  By: Delrae Alfred MD, [name]   Trigeminal neuralgia    ULCER-GASTRIC 09/28/2009   Qualifier: Diagnosis of  By: Wilmon Pali NP, Leeann Must, CANDIDAL 12/25/2009   Qualifier: Diagnosis of  By: Delrae Alfred MD, Hubbard Robinson D deficiency 04/25/2013    Objective No conversational dyspnea Age appropriate judgment and insight Nml affect and mood  Colitis - Plan: Oxycodone HCl 10 MG TABS, GI Profile, Stool, PCR  Refill Oxycodone. Cont Bentyl, Phenergan, Augmentin- may need longer on the abx. GI profile ordered, if she turns corner in next day, can avoid doing. Continue to push fluids, practice good hand hygiene, go to ER if worsening s/s's. F/u w reg PCP in 1 week.  Pt voiced understanding and agreement to the plan.  Jilda Roche [place], DO 07/29/22 2:31 PM

## 2022-07-30 ENCOUNTER — Encounter (HOSPITAL_COMMUNITY): Payer: Self-pay

## 2022-07-30 ENCOUNTER — Emergency Department (HOSPITAL_COMMUNITY)
Admission: EM | Admit: 2022-07-30 | Discharge: 2022-07-31 | Disposition: A | Discharge status: Home / Self Care | Payer: Commercial Managed Care - HMO | Attending: Emergency Medicine | Admitting: Emergency Medicine

## 2022-07-30 ENCOUNTER — Emergency Department (HOSPITAL_COMMUNITY): Payer: Commercial Managed Care - HMO

## 2022-07-30 ENCOUNTER — Other Ambulatory Visit: Payer: Self-pay

## 2022-07-30 DIAGNOSIS — Z7982 Long term (current) use of aspirin: Secondary | ICD-10-CM | POA: Insufficient documentation

## 2022-07-30 DIAGNOSIS — R1011 Right upper quadrant pain: Secondary | ICD-10-CM | POA: Insufficient documentation

## 2022-07-30 DIAGNOSIS — R1013 Epigastric pain: Secondary | ICD-10-CM | POA: Diagnosis not present

## 2022-07-30 DIAGNOSIS — R6883 Chills (without fever): Secondary | ICD-10-CM | POA: Insufficient documentation

## 2022-07-30 DIAGNOSIS — R1031 Right lower quadrant pain: Secondary | ICD-10-CM | POA: Diagnosis present

## 2022-07-30 DIAGNOSIS — R197 Diarrhea, unspecified: Secondary | ICD-10-CM | POA: Insufficient documentation

## 2022-07-30 DIAGNOSIS — R5383 Other fatigue: Secondary | ICD-10-CM | POA: Diagnosis not present

## 2022-07-30 DIAGNOSIS — R112 Nausea with vomiting, unspecified: Secondary | ICD-10-CM | POA: Diagnosis not present

## 2022-07-30 DIAGNOSIS — Z91040 Latex allergy status: Secondary | ICD-10-CM | POA: Insufficient documentation

## 2022-07-30 LAB — COMPREHENSIVE METABOLIC PANEL
ALT: 16 U/L (ref 0–44)
AST: 18 U/L (ref 15–41)
Albumin: 3.9 g/dL (ref 3.5–5.0)
Alkaline Phosphatase: 98 U/L (ref 38–126)
Anion gap: 18 — ABNORMAL HIGH (ref 5–15)
BUN: 17 mg/dL (ref 6–20)
CO2: 20 mmol/L — ABNORMAL LOW (ref 22–32)
Calcium: 9.3 mg/dL (ref 8.9–10.3)
Chloride: 99 mmol/L (ref 98–111)
Creatinine, Ser: 1.37 mg/dL — ABNORMAL HIGH (ref 0.44–1.00)
GFR, Estimated: 45 mL/min — ABNORMAL LOW (ref >60)
Glucose, Bld: 209 mg/dL — ABNORMAL HIGH (ref 70–99)
Potassium: 3.6 mmol/L (ref 3.5–5.1)
Sodium: 137 mmol/L (ref 135–145)
Total Bilirubin: 0.9 mg/dL (ref 0.3–1.2)
Total Protein: 8.7 g/dL — ABNORMAL HIGH (ref 6.5–8.1)

## 2022-07-30 LAB — BLOOD GAS, VENOUS
Acid-Base Excess: 2.1 mmol/L — ABNORMAL HIGH (ref 0.0–2.0)
Bicarbonate: 22.5 mmol/L (ref 20.0–28.0)
O2 Saturation: 54.2 %
Patient temperature: 37
pCO2, Ven: 24 mmHg — ABNORMAL LOW (ref 44–60)
pH, Ven: 7.58 — ABNORMAL HIGH (ref 7.25–7.43)
pO2, Ven: 33 mmHg (ref 32–45)

## 2022-07-30 LAB — CBC WITH DIFFERENTIAL/PLATELET
Abs Immature Granulocytes: 0.03 10*3/uL (ref 0.00–0.07)
Basophils Absolute: 0 10*3/uL (ref 0.0–0.1)
Basophils Relative: 0 %
Eosinophils Absolute: 0.6 10*3/uL — ABNORMAL HIGH (ref 0.0–0.5)
Eosinophils Relative: 6 %
HCT: 41.6 % (ref 36.0–46.0)
Hemoglobin: 14.1 g/dL (ref 12.0–15.0)
Immature Granulocytes: 0 %
Lymphocytes Relative: 21 %
Lymphs Abs: 2.1 10*3/uL (ref 0.7–4.0)
MCH: 28.8 pg (ref 26.0–34.0)
MCHC: 33.9 g/dL (ref 30.0–36.0)
MCV: 85.1 fL (ref 80.0–100.0)
Monocytes Absolute: 0.6 10*3/uL (ref 0.1–1.0)
Monocytes Relative: 6 %
Neutro Abs: 6.7 10*3/uL (ref 1.7–7.7)
Neutrophils Relative %: 67 %
Platelets: 334 10*3/uL (ref 150–400)
RBC: 4.89 MIL/uL (ref 3.87–5.11)
RDW: 14 % (ref 11.5–15.5)
WBC: 10 10*3/uL (ref 4.0–10.5)
nRBC: 0 % (ref 0.0–0.2)

## 2022-07-30 LAB — URINALYSIS, W/ REFLEX TO CULTURE (INFECTION SUSPECTED)
Bacteria, UA: NONE SEEN
Bilirubin Urine: NEGATIVE
Glucose, UA: 150 mg/dL — AB
Ketones, ur: 20 mg/dL — AB
Nitrite: NEGATIVE
Protein, ur: 100 mg/dL — AB
Specific Gravity, Urine: >1.046 — ABNORMAL HIGH (ref 1.005–1.030)
pH: 6 (ref 5.0–8.0)

## 2022-07-30 LAB — BETA-HYDROXYBUTYRIC ACID: Beta-Hydroxybutyric Acid: 0.75 mmol/L — ABNORMAL HIGH (ref 0.05–0.27)

## 2022-07-30 LAB — LACTIC ACID, PLASMA
Lactic Acid, Venous: 5 mmol/L (ref 0.5–1.9)
Lactic Acid, Venous: 1.2 mmol/L (ref 0.5–1.9)

## 2022-07-30 LAB — LIPASE, BLOOD: Lipase: 36 U/L (ref 11–51)

## 2022-07-30 MED ORDER — HYDROMORPHONE HCL 1 MG/ML IJ SOLN
1.0000 mg | Freq: Once | INTRAMUSCULAR | Status: AC
  Administered 2022-07-30: 1 mg via INTRAVENOUS
  Filled 2022-07-30: qty 1

## 2022-07-30 MED ORDER — PROMETHAZINE HCL 25 MG PO TABS
25.0000 mg | ORAL_TABLET | Freq: Once | ORAL | Status: AC
  Administered 2022-07-30: 25 mg via ORAL
  Filled 2022-07-30: qty 1

## 2022-07-30 MED ORDER — PROMETHAZINE HCL 25 MG PO TABS: 25.0000 mg | ORAL_TABLET | Freq: Four times a day (QID) | ORAL | 0 refills | Status: DC | PRN

## 2022-07-30 MED ORDER — OXYCODONE HCL 5 MG PO TABS
5.0000 mg | ORAL_TABLET | Freq: Once | ORAL | Status: AC
  Administered 2022-07-30: 5 mg via ORAL
  Filled 2022-07-30: qty 1

## 2022-07-30 MED ORDER — ONDANSETRON HCL 4 MG/2ML IJ SOLN
4.0000 mg | Freq: Once | INTRAMUSCULAR | Status: AC
  Administered 2022-07-30: 4 mg via INTRAVENOUS
  Filled 2022-07-30: qty 2

## 2022-07-30 MED ORDER — OXYCODONE HCL 5 MG PO TABS: 5.0000 mg | ORAL_TABLET | ORAL | 0 refills | Status: DC | PRN

## 2022-07-30 MED ORDER — SODIUM CHLORIDE 0.9 % IV BOLUS
1000.0000 mL | Freq: Once | INTRAVENOUS | Status: AC
  Administered 2022-07-30: 1000 mL via INTRAVENOUS

## 2022-07-30 MED ORDER — SODIUM CHLORIDE 0.9 % IV SOLN
25.0000 mg | Freq: Once | INTRAVENOUS | Status: AC
  Administered 2022-07-30: 25 mg via INTRAVENOUS
  Filled 2022-07-30: qty 25

## 2022-07-30 MED ORDER — IOHEXOL 300 MG/ML  SOLN
100.0000 mL | Freq: Once | INTRAMUSCULAR | Status: AC | PRN
  Administered 2022-07-30: 100 mL via INTRAVENOUS

## 2022-07-30 MED ORDER — SODIUM CHLORIDE 0.9 % IV SOLN
12.5000 mg | Freq: Once | INTRAVENOUS | Status: AC
  Administered 2022-07-30: 12.5 mg via INTRAVENOUS
  Filled 2022-07-30: qty 12.5

## 2022-07-30 NOTE — ED Notes (Signed)
Lactic 5.0 Dr. Rush Landmark made aware.

## 2022-07-30 NOTE — ED Provider Notes (Signed)
[place] EMERGENCY DEPARTMENT AT [facility] Provider Note   CSN: [id number] Arrival date & time: 07/30/22  1543     History  Chief Complaint  Patient presents with   Abdominal Pain    [name] is a 57 y.o. female.  The history is provided by the patient and medical records. No language interpreter was used.  Abdominal Pain Pain location:  RLQ, RUQ, R flank, epigastric and suprapubic Pain quality: aching   Pain radiates to:  R flank Pain severity:  Severe Onset quality:  Gradual Duration:  3 days Timing:  Constant Progression:  Worsening Chronicity:  New Context: not trauma   Relieved by:  Nothing Ineffective treatments:  Palpation Associated symptoms: chills, diarrhea, fatigue, nausea and vomiting   Associated symptoms: no chest pain, no constipation, no cough, no dysuria, no fever and no shortness of breath        Home Medications Prior to Admission medications   Medication Sig Start Date End Date Taking? Authorizing Provider  albuterol (VENTOLIN HFA) 108 (90 Base) MCG/ACT inhaler Inhale 2 puffs into the lungs every 6 (six) hours as needed for wheezing or shortness of breath. Patient taking differently: Inhale 2 puffs into the lungs 3 (three) times daily as needed for wheezing or shortness of breath. 08/03/20   Saguier, Ramon Dredge, PA-C  amLODipine (NORVASC) 5 MG tablet Take 1 tablet (5 mg total) by mouth daily. 01/13/22 01/08/23  Saguier, Ramon Dredge, PA-C  amoxicillin-clavulanate (AUGMENTIN) 875-125 MG tablet Take 1 tablet by mouth every 12 (twelve) hours. 07/28/22   Carmel Sacramento A, PA-C  aspirin 81 MG tablet Take 81 mg by mouth daily.    [provider]  atorvastatin (LIPITOR) 80 MG tablet Take 1 tablet (80 mg total) by mouth daily. 01/13/22   Saguier, Ramon Dredge, PA-C  busPIRone (BUSPAR) 15 MG tablet Take 1 tablet (15 mg total) by mouth 2 (two) times daily. 01/13/22   Saguier, Ramon Dredge, PA-C  carvedilol (COREG) 25 MG tablet Take 1 tablet (25 mg  total) by mouth 2 (two) times daily with a meal. 06/18/22   Saguier, Ramon Dredge, PA-C  dicyclomine (BENTYL) 20 MG tablet Take 1 tablet (20 mg total) by mouth 2 (two) times daily. 07/28/22   Carmel Sacramento A, PA-C  fluticasone (FLONASE) 50 MCG/ACT nasal spray Place 2 sprays into both nostrils daily. 01/13/22   Saguier, Ramon Dredge, PA-C  fluticasone furoate-vilanterol (BREO ELLIPTA) 100-25 MCG/INH AEPB Inhale 1 puff into the lungs daily. 08/03/20   Saguier, Ramon Dredge, PA-C  gabapentin (NEURONTIN) 300 MG capsule TAKE 2 CAPSULES BY MOUTH THREE TIMES DAILY 07/28/22   Saguier, Ramon Dredge, PA-C  Insulin Glargine [facility]) 100 UNIT/ML INJECT 40 UNITS SUBCUTANEOUSLY AT BEDTIME 07/28/22   Saguier, Ramon Dredge, PA-C  Insulin Pen Needle (PEN NEEDLES 31GX5/16") 31G X 8 MM MISC Use daily as directed 03/28/22   Saguier, Ramon Dredge, PA-C  metFORMIN (GLUCOPHAGE-XR) 500 MG 24 hr tablet Take 1 tablet (500 mg total) by mouth daily with breakfast. 01/22/22   Saguier, Ramon Dredge, PA-C  methocarbamol (ROBAXIN) 750 MG tablet Take 1 tablet (750 mg total) by mouth every 8 (eight) hours as needed for muscle spasms. 05/19/22   Saguier, Ramon Dredge, PA-C  nitroGLYCERIN (NITROSTAT) 0.4 MG SL tablet Place 1 tablet (0.4 mg total) under the tongue every 5 (five) minutes as needed for chest pain. 03/21/21   Saguier, Ramon Dredge, PA-C  Oxycodone HCl 10 MG TABS Take 1 tablet (10 mg total) by mouth every 6 (six) hours as needed. 07/29/22   Sharlene Dory, DO  promethazine (PHENERGAN) 12.5 MG tablet Take 1 tablet (12.5 mg total) by mouth every 8 (eight) hours as needed for nausea or vomiting. 07/28/22   Cristi Loron, Celeste A, PA-C  Semaglutide (OZEMPIC, 0.25 OR 0.5 MG/DOSE, [place]) Inject 0.25 mg into the skin once a week. mondays    [provider]  topiramate (TOPAMAX) 50 MG tablet Take 1 tablet (50 mg total) by mouth 2 (two) times daily. 01/13/22   Saguier, Ramon Dredge, PA-C  VITAMIN D PO Take 1 capsule by mouth daily.    [provider]      Allergies     Lisinopril, Oxycodone-acetaminophen, Insulin aspart, Latex, and Morphine    Review of Systems   Review of Systems  Constitutional:  Positive for chills and fatigue. Negative for fever.  HENT:  Negative for congestion.   Respiratory:  Negative for cough, chest tightness, shortness of breath and wheezing.   Cardiovascular:  Negative for chest pain, palpitations and leg swelling.  Gastrointestinal:  Positive for abdominal pain, diarrhea, nausea and vomiting. Negative for constipation.  Genitourinary:  Positive for flank pain. Negative for dysuria and frequency.  Musculoskeletal:  Negative for back pain, neck pain and neck stiffness.  Skin:  Negative for rash and wound.  Neurological:  Positive for light-headedness. Negative for dizziness and headaches.  Psychiatric/Behavioral:  Negative for agitation and confusion.   All other systems reviewed and are negative.   Physical Exam Updated Vital Signs BP 121/80 (BP Location: Left Arm)   Pulse 90   Temp 98.1 F (36.7 C) (Oral)   Resp 20   Ht 6' (1.829 m)   Wt 88 kg   SpO2 100%   BMI 26.31 kg/m  Physical Exam Vitals and nursing note reviewed.  Constitutional:      General: She is not in acute distress.    Appearance: She is well-developed. She is ill-appearing. She is not toxic-appearing or diaphoretic.  HENT:     Head: Normocephalic and atraumatic.  Eyes:     Conjunctiva/sclera: Conjunctivae normal.  Cardiovascular:     Rate and Rhythm: Regular rhythm. Tachycardia present.     Heart sounds: No murmur heard. Pulmonary:     Effort: Pulmonary effort is normal. No respiratory distress.     Breath sounds: Normal breath sounds.  Abdominal:     General: Abdomen is flat. Bowel sounds are normal.     Palpations: Abdomen is soft.     Tenderness: There is abdominal tenderness in the right upper quadrant, right lower quadrant, epigastric area, periumbilical area and suprapubic area. There is no right CVA tenderness or left CVA tenderness.   Musculoskeletal:        General: No swelling.     Cervical back: Neck supple.  Skin:    General: Skin is warm and dry.     Capillary Refill: Capillary refill takes less than 2 seconds.  Neurological:     Mental Status: She is alert.  Psychiatric:        Mood and Affect: Mood normal.   ED Results / Procedures / Treatments   Labs (all labs ordered are listed, but only abnormal results are displayed) Labs Reviewed  CBC WITH DIFFERENTIAL/PLATELET - Abnormal; Notable for the following components:      Result Value   Eosinophils Absolute 0.6 (*)    All other components within normal limits  COMPREHENSIVE METABOLIC PANEL - Abnormal; Notable for the following components:   CO2 20 (*)    Glucose, Bld 209 (*)    Creatinine, Ser  1.37 (*)    Total Protein 8.7 (*)    GFR, Estimated 45 (*)    Anion gap 18 (*)    All other components within normal limits  LACTIC ACID, PLASMA - Abnormal; Notable for the following components:   Lactic Acid, Venous 5.0 (*)    All other components within normal limits  BETA-HYDROXYBUTYRIC ACID - Abnormal; Notable for the following components:   Beta-Hydroxybutyric Acid 0.75 (*)    All other components within normal limits  BLOOD GAS, VENOUS - Abnormal; Notable for the following components:   pH, Ven 7.58 (*)    pCO2, Ven 24 (*)    Acid-Base Excess 2.1 (*)    All other components within normal limits  URINALYSIS, W/ REFLEX TO CULTURE (INFECTION SUSPECTED) - Abnormal; Notable for the following components:   Specific Gravity, Urine >1.046 (*)    Glucose, UA 150 (*)    Hgb urine dipstick SMALL (*)    Ketones, ur 20 (*)    Protein, ur 100 (*)    Leukocytes,Ua TRACE (*)    All other components within normal limits  CULTURE, BLOOD (ROUTINE X 2)  CULTURE, BLOOD (ROUTINE X 2)  URINE CULTURE  LACTIC ACID, PLASMA  LIPASE, BLOOD    EKG None  Radiology CT ABDOMEN PELVIS W CONTRAST  Result Date: 07/30/2022 CLINICAL DATA:  Right lower quadrant pain EXAM:  CT ABDOMEN AND PELVIS WITH CONTRAST TECHNIQUE: Multidetector CT imaging of the abdomen and pelvis was performed using the standard protocol following bolus administration of intravenous contrast. RADIATION DOSE REDUCTION: This exam was performed according to the departmental dose-optimization program which includes automated exposure control, adjustment of the mA and/or kV according to patient size and/or use of iterative reconstruction technique. CONTRAST:  OMNIPAQUE IOHEXOL 300 MG/ML  SOLN COMPARISON:  CT abdomen and pelvis 07/28/2022 FINDINGS: Lower chest: No acute abnormality. Hepatobiliary: There is fatty infiltration of the liver. The gallbladder is surgically absent. Pancreas: Unremarkable. No pancreatic ductal dilatation or surrounding inflammatory changes. Spleen: Normal in size without focal abnormality. Adrenals/Urinary Tract: Adrenal glands are unremarkable. Kidneys are normal, without renal calculi, focal lesion, or hydronephrosis. Bladder is unremarkable. Stomach/Bowel: Stomach is within normal limits. Appendix appears normal. No evidence of bowel wall thickening, distention, or inflammatory changes. Vascular/Lymphatic: There are atherosclerotic calcifications of the aorta. No enlarged abdominal or pelvic lymph nodes. There surgical clips along the pelvic sidewalls. Reproductive: Status post hysterectomy. No adnexal masses. Other: There is trace free fluid in the pelvis. Musculoskeletal: No fracture is seen. Degenerative changes affect the spine. IMPRESSION: 1. Trace free fluid in the pelvis. 2. Normal appendix. 3. Fatty infiltration of the liver. Aortic Atherosclerosis (ICD10-I70.0). Electronically Signed   By: Darliss Cheney M.D.   On: 07/30/2022 18:20    Procedures Procedures    Medications Ordered in ED Medications  promethazine (PHENERGAN) 12.5 mg in sodium chloride 0.9 % 50 mL IVPB (has no administration in time range)  sodium chloride 0.9 % bolus 1,000 mL (0 mLs Intravenous Stopped  07/30/22 1946)  ondansetron (ZOFRAN) injection 4 mg (4 mg Intravenous Given 07/30/22 1646)  HYDROmorphone (DILAUDID) injection 1 mg (1 mg Intravenous Given 07/30/22 1648)  promethazine (PHENERGAN) 25 mg in sodium chloride 0.9 % 50 mL IVPB (0 mg Intravenous Stopped 07/30/22 1947)  iohexol (OMNIPAQUE) 300 MG/ML solution 100 mL (100 mLs Intravenous Contrast Given 07/30/22 1805)  HYDROmorphone (DILAUDID) injection 1 mg (1 mg Intravenous Given 07/30/22 2149)  oxyCODONE (Oxy IR/ROXICODONE) immediate release tablet 5 mg (5 mg Oral Given  07/30/22 2258)  promethazine (PHENERGAN) tablet 25 mg (25 mg Oral Given 07/30/22 2258)    ED Course/ Medical Decision Making/ A&P                             Medical Decision Making Amount and/or Complexity of Data Reviewed Labs: ordered. Radiology: ordered.  Risk Prescription drug management.    CHANTEL NORR is a 57 y.o. female with a past medical history significant for diabetes, hypertension, hyperlipidemia, cardiomyopathy, CAD, migraines, diabetic gastroparesis, and recent diagnosis of colitis several days ago who presents with intolerance to p.o. with persistent nausea, vomiting, feeling dehydrated, lightheaded, shaking chills, severe worsened abdominal pain, and diarrhea.  According to patient, she was discharged several days given emergency department when she was found to have a diffuse colitis.  She said that now she got tolerating for home medications because she is vomiting too much.  She is feeling dehydrated and lightheaded but denies chest pain or palpitations.  No shortness of breath.  Reports the pain is more severe across her abdomen in the right lower quadrant.  She reports she still having some diarrhea but does not report any bloody stools.  Denies any urinary changes.  Is shaking and shivering on my initial evaluation.  On exam, lungs were clear.  Chest is nontender.  Right abdomen is tender to palpation going towards her right flank.  No initial  rash seen.  Back nontender.  Bowel sounds are appreciated.  Patient ill-appearing.  Clinically I am concerned patient could have worsening colitis versus other complication.  Will get a repeat CT scan to look for perforation or abscess and will also get repeat labs.  Will give pain medicine, nausea medicine, and fluids and will keep NPO.  Will make sure she is not developing DKA.  Anticipate reassessment after workup to determine if patient needs admission for intolerance of p.o.  11:29 PM CT scan looked improved and showed no acute abnormality.  Patient is lactic acid is elevated likely due to dehydration but then resolved after fluids.  It is normal now.  Lipase normal.  Urinalysis showed no nitrites or bacteria, doubt infection.  CBC shows no leukocytosis or anemia.  Liver function normal.  Suspect continued GI illness.  After IV medications we are able to improve her symptoms.  She would like to go home now.  Will give prescription for pain medicine and nausea medicine and she will continue her outpatient regimen.  She will follow-up with her primary doctor.  She understood return precautions and follow-up instructions and was discharged in good condition.        Final Clinical Impression(s) / ED Diagnoses Final diagnoses:  Nausea vomiting and diarrhea  Right lower quadrant abdominal pain    Rx / DC Orders ED Discharge Orders          Ordered    oxyCODONE (ROXICODONE) 5 MG immediate release tablet  Every 4 hours PRN        07/30/22 2331    promethazine (PHENERGAN) 25 MG tablet  Every 6 hours PRN        07/30/22 2331            Clinical Impression: 1. Nausea vomiting and diarrhea   2. Right lower quadrant abdominal pain     Disposition: Discharge  Condition: Good  I have discussed the results, Dx and Tx plan with the pt(& family if present). He/she/they expressed understanding and agree(s) with  the plan. Discharge instructions discussed at great length. Strict return  precautions discussed and pt &/or family have verbalized understanding of the instructions. No further questions at time of discharge.    New Prescriptions   OXYCODONE (ROXICODONE) 5 MG IMMEDIATE RELEASE TABLET    Take 1 tablet (5 mg total) by mouth every 4 (four) hours as needed for severe pain.   PROMETHAZINE (PHENERGAN) 25 MG TABLET    Take 1 tablet (25 mg total) by mouth every 6 (six) hours as needed for nausea or vomiting.    Follow Up: Marisue Brooklyn [address] [place] [address] [phone]     [facility] Emergency Department at [facility] [address] [id number] mc [place] [address] [phone]        Daris Aristizabal, Canary Brim, MD 07/30/22 [phone]

## 2022-07-30 NOTE — Discharge Instructions (Addendum)
Your history, exam, workup today did not show acute worsening of your GI problems and colitis.  The CT scan showed no perforation, abscess, diverticulitis, appendicitis, or other acute abnormality.  Your labs are also overall similar or improved from prior.  After fluids and medications you improved.  We feel you are safe for discharge home.  Please use the pain and nausea medicine and follow-up with your primary team.  If any symptoms change or worsen acutely, return to the nearest emergency room.

## 2022-07-30 NOTE — ED Notes (Signed)
Patient transported to CT 

## 2022-07-30 NOTE — ED Triage Notes (Signed)
Patient BIB [facility] from home. Was here Monday for the same lower right quadrant abdominal pain. Has a colon infection. Has been vomiting along with diarrhea. Has type 1 diabetes and said she has not taken her insulin today. Stated her sugars are in the 200s.

## 2022-07-31 ENCOUNTER — Emergency Department (HOSPITAL_COMMUNITY)
Admission: EM | Admit: 2022-07-31 | Discharge: 2022-07-31 | Disposition: A | Discharge status: Home / Self Care | Payer: Commercial Managed Care - HMO | Source: Home / Self Care | Attending: Emergency Medicine | Admitting: Emergency Medicine

## 2022-07-31 DIAGNOSIS — R110 Nausea: Secondary | ICD-10-CM | POA: Insufficient documentation

## 2022-07-31 DIAGNOSIS — Z79899 Other long term (current) drug therapy: Secondary | ICD-10-CM | POA: Insufficient documentation

## 2022-07-31 DIAGNOSIS — Z91040 Latex allergy status: Secondary | ICD-10-CM | POA: Insufficient documentation

## 2022-07-31 DIAGNOSIS — I2510 Atherosclerotic heart disease of native coronary artery without angina pectoris: Secondary | ICD-10-CM | POA: Insufficient documentation

## 2022-07-31 DIAGNOSIS — R1013 Epigastric pain: Secondary | ICD-10-CM | POA: Insufficient documentation

## 2022-07-31 DIAGNOSIS — Z794 Long term (current) use of insulin: Secondary | ICD-10-CM | POA: Insufficient documentation

## 2022-07-31 DIAGNOSIS — Z7984 Long term (current) use of oral hypoglycemic drugs: Secondary | ICD-10-CM | POA: Insufficient documentation

## 2022-07-31 DIAGNOSIS — N10 Acute tubulo-interstitial nephritis: Secondary | ICD-10-CM | POA: Diagnosis not present

## 2022-07-31 DIAGNOSIS — Z7982 Long term (current) use of aspirin: Secondary | ICD-10-CM | POA: Insufficient documentation

## 2022-07-31 DIAGNOSIS — I10 Essential (primary) hypertension: Secondary | ICD-10-CM | POA: Insufficient documentation

## 2022-07-31 DIAGNOSIS — E119 Type 2 diabetes mellitus without complications: Secondary | ICD-10-CM | POA: Insufficient documentation

## 2022-07-31 DIAGNOSIS — R109 Unspecified abdominal pain: Secondary | ICD-10-CM

## 2022-07-31 DIAGNOSIS — E860 Dehydration: Secondary | ICD-10-CM | POA: Diagnosis not present

## 2022-07-31 LAB — URINE CULTURE: Culture: NO GROWTH

## 2022-07-31 LAB — CULTURE, BLOOD (ROUTINE X 2): Culture: NO GROWTH

## 2022-07-31 MED ORDER — DROPERIDOL 2.5 MG/ML IJ SOLN
1.2500 mg | Freq: Once | INTRAMUSCULAR | Status: AC
  Administered 2022-07-31: 1.25 mg via INTRAVENOUS
  Filled 2022-07-31: qty 2

## 2022-07-31 MED ORDER — DICYCLOMINE HCL 10 MG/ML IM SOLN
20.0000 mg | Freq: Once | INTRAMUSCULAR | Status: AC
  Administered 2022-07-31: 20 mg via INTRAMUSCULAR
  Filled 2022-07-31: qty 2

## 2022-07-31 MED ORDER — PROMETHAZINE HCL 25 MG/ML IJ SOLN: 12.5000 mg | Freq: Four times a day (QID) | INTRAMUSCULAR | Status: DC | PRN

## 2022-07-31 MED ORDER — HYDROMORPHONE HCL 1 MG/ML IJ SOLN
1.0000 mg | Freq: Once | INTRAMUSCULAR | Status: AC
  Administered 2022-07-31: 1 mg via INTRAMUSCULAR
  Filled 2022-07-31: qty 1

## 2022-07-31 MED ORDER — LACTATED RINGERS IV BOLUS
1000.0000 mL | Freq: Once | INTRAVENOUS | Status: AC
  Administered 2022-07-31: 1000 mL via INTRAVENOUS

## 2022-07-31 MED ORDER — PROMETHAZINE HCL 25 MG/ML IJ SOLN
12.5000 mg | Freq: Once | INTRAMUSCULAR | Status: AC
  Administered 2022-07-31: 12.5 mg via INTRAMUSCULAR
  Filled 2022-07-31: qty 1

## 2022-07-31 MED ORDER — LORAZEPAM 2 MG/ML IJ SOLN
0.5000 mg | Freq: Once | INTRAMUSCULAR | Status: AC
  Administered 2022-07-31: 0.5 mg via INTRAVENOUS
  Filled 2022-07-31: qty 1

## 2022-07-31 NOTE — ED Notes (Signed)
Pt given x2 small ice chips to attempt PO challenge at this time, and pt immediately dry heaving and reports severe nausea that is unrelieved with small increment of time. Dr. Truitt Merle.

## 2022-07-31 NOTE — ED Notes (Signed)
Pt tolerating ice chips at this time with no complaints or observations of nausea, dry heaves, vomiting at this time. Dr. Posey Rea provided update, okay received to discharge pt at this time to home.

## 2022-07-31 NOTE — ED Notes (Signed)
Pt reports her ride is not able to come pick her up at this time, and her cell phone is home along with her money. Okay with charge nurse for pt to wait in room until ride is here, pt instructed to call out when ride arrives.

## 2022-07-31 NOTE — ED Provider Notes (Signed)
[place] EMERGENCY DEPARTMENT AT [facility] Provider Note   CSN: [id number] Arrival date & time: 07/31/22  3329     History  Chief Complaint  Patient presents with   Nausea    SITA [name] is a 57 y.o. female.  AARIANNA [name] is a 57 y.o. female with a history of CAD, hypertension, hyperlipidemia, diabetes and gastroparesis, who was seen in the ED last night for evaluation of nausea, vomiting and abdominal pain.  She had an extensive workup which was ultimately reassuring and she was discharged, she waited in the ED overnight for a ride this morning, 30 minutes after leaving the department and waiting at the bus stop she checked back into the emergency department reporting that her stomach pain and nausea started to come back.  She did not leave the premises or attempt to go to the pharmacy to pick up the pain and nausea medication that had been prescribed for her.  She reports upper abdominal pain and nausea but has not vomited.  Patient last received medication around midnight last night.  Patient denies any new or worsening symptoms just reports that her symptoms have started to return.  The history is provided by the patient.       Home Medications Prior to Admission medications   Medication Sig Start Date End Date Taking? Authorizing Provider  albuterol (VENTOLIN HFA) 108 (90 Base) MCG/ACT inhaler Inhale 2 puffs into the lungs every 6 (six) hours as needed for wheezing or shortness of breath. Patient taking differently: Inhale 2 puffs into the lungs 3 (three) times daily as needed for wheezing or shortness of breath. 08/03/20   Saguier, Ramon Dredge, PA-C  amLODipine (NORVASC) 5 MG tablet Take 1 tablet (5 mg total) by mouth daily. 01/13/22 01/08/23  Saguier, Ramon Dredge, PA-C  amoxicillin-clavulanate (AUGMENTIN) 875-125 MG tablet Take 1 tablet by mouth every 12 (twelve) hours. 07/28/22   Carmel Sacramento A, PA-C  aspirin 81 MG tablet Take 81 mg by mouth daily.     [provider]  atorvastatin (LIPITOR) 80 MG tablet Take 1 tablet (80 mg total) by mouth daily. 01/13/22   Saguier, Ramon Dredge, PA-C  busPIRone (BUSPAR) 15 MG tablet Take 1 tablet (15 mg total) by mouth 2 (two) times daily. 01/13/22   Saguier, Ramon Dredge, PA-C  carvedilol (COREG) 25 MG tablet Take 1 tablet (25 mg total) by mouth 2 (two) times daily with a meal. 06/18/22   Saguier, Ramon Dredge, PA-C  dicyclomine (BENTYL) 20 MG tablet Take 1 tablet (20 mg total) by mouth 2 (two) times daily. 07/28/22   Carmel Sacramento A, PA-C  fluticasone (FLONASE) 50 MCG/ACT nasal spray Place 2 sprays into both nostrils daily. 01/13/22   Saguier, Ramon Dredge, PA-C  fluticasone furoate-vilanterol (BREO ELLIPTA) 100-25 MCG/INH AEPB Inhale 1 puff into the lungs daily. 08/03/20   Saguier, Ramon Dredge, PA-C  gabapentin (NEURONTIN) 300 MG capsule TAKE 2 CAPSULES BY MOUTH THREE TIMES DAILY 07/28/22   Saguier, Ramon Dredge, PA-C  Insulin Glargine [facility]) 100 UNIT/ML INJECT 40 UNITS SUBCUTANEOUSLY AT BEDTIME 07/28/22   Saguier, Ramon Dredge, PA-C  Insulin Pen Needle (PEN NEEDLES 31GX5/16") 31G X 8 MM MISC Use daily as directed 03/28/22   Saguier, Ramon Dredge, PA-C  metFORMIN (GLUCOPHAGE-XR) 500 MG 24 hr tablet Take 1 tablet (500 mg total) by mouth daily with breakfast. 01/22/22   Saguier, Ramon Dredge, PA-C  methocarbamol (ROBAXIN) 750 MG tablet Take 1 tablet (750 mg total) by mouth every 8 (eight) hours as needed for muscle spasms. 05/19/22   Saguier,  Ramon Dredge, PA-C  nitroGLYCERIN (NITROSTAT) 0.4 MG SL tablet Place 1 tablet (0.4 mg total) under the tongue every 5 (five) minutes as needed for chest pain. 03/21/21   Saguier, Ramon Dredge, PA-C  oxyCODONE (ROXICODONE) 5 MG immediate release tablet Take 1 tablet (5 mg total) by mouth every 4 (four) hours as needed for severe pain. 07/30/22   Tegeler, Canary Brim, MD  Oxycodone HCl 10 MG TABS Take 1 tablet (10 mg total) by mouth every 6 (six) hours as needed. 07/29/22   Sharlene Dory, DO  promethazine (PHENERGAN) 12.5  MG tablet Take 1 tablet (12.5 mg total) by mouth every 8 (eight) hours as needed for nausea or vomiting. 07/28/22   Carmel Sacramento A, PA-C  promethazine (PHENERGAN) 25 MG tablet Take 1 tablet (25 mg total) by mouth every 6 (six) hours as needed for nausea or vomiting. 07/30/22   Tegeler, Canary Brim, MD  Semaglutide (OZEMPIC, 0.25 OR 0.5 MG/DOSE, [place]) Inject 0.25 mg into the skin once a week. mondays    [provider]  topiramate (TOPAMAX) 50 MG tablet Take 1 tablet (50 mg total) by mouth 2 (two) times daily. 01/13/22   Saguier, Ramon Dredge, PA-C  VITAMIN D PO Take 1 capsule by mouth daily.    [provider]      Allergies    Lisinopril, Oxycodone-acetaminophen, Insulin aspart, Latex, and Morphine    Review of Systems   Review of Systems  Constitutional:  Negative for chills and fever.  Respiratory:  Negative for shortness of breath.   Cardiovascular:  Negative for chest pain.  Gastrointestinal:  Positive for abdominal pain, nausea and vomiting. Negative for diarrhea.    Physical Exam Updated Vital Signs BP (!) 156/103 (BP Location: Left Arm)   Pulse 92   Temp 98.3 F (36.8 C) (Oral)   Resp 18   SpO2 100%  Physical Exam Vitals and nursing note reviewed.  Constitutional:      General: She is not in acute distress.    Appearance: Normal appearance. She is well-developed. She is not ill-appearing or diaphoretic.  HENT:     Head: Normocephalic and atraumatic.  Eyes:     General:        Right eye: No discharge.        Left eye: No discharge.  Cardiovascular:     Rate and Rhythm: Normal rate and regular rhythm.     Heart sounds: Normal heart sounds.  Pulmonary:     Effort: Pulmonary effort is normal. No respiratory distress.     Breath sounds: Normal breath sounds.  Abdominal:     Comments: Soft, nondistended, mild epigastric tenderness present without guarding, all other quadrants nontender to palpation  Neurological:     Mental Status: She is alert and oriented  to person, place, and time.     Coordination: Coordination normal.  Psychiatric:        Mood and Affect: Mood normal.        Behavior: Behavior normal.     ED Results / Procedures / Treatments   Labs (all labs ordered are listed, but only abnormal results are displayed) Labs Reviewed - No data to display  EKG None  Radiology CT ABDOMEN PELVIS W CONTRAST  Result Date: 07/30/2022 CLINICAL DATA:  Right lower quadrant pain EXAM: CT ABDOMEN AND PELVIS WITH CONTRAST TECHNIQUE: Multidetector CT imaging of the abdomen and pelvis was performed using the standard protocol following bolus administration of intravenous contrast. RADIATION DOSE REDUCTION: This exam was performed according to  the departmental dose-optimization program which includes automated exposure control, adjustment of the mA and/or kV according to patient size and/or use of iterative reconstruction technique. CONTRAST:  OMNIPAQUE IOHEXOL 300 MG/ML  SOLN COMPARISON:  CT abdomen and pelvis 07/28/2022 FINDINGS: Lower chest: No acute abnormality. Hepatobiliary: There is fatty infiltration of the liver. The gallbladder is surgically absent. Pancreas: Unremarkable. No pancreatic ductal dilatation or surrounding inflammatory changes. Spleen: Normal in size without focal abnormality. Adrenals/Urinary Tract: Adrenal glands are unremarkable. Kidneys are normal, without renal calculi, focal lesion, or hydronephrosis. Bladder is unremarkable. Stomach/Bowel: Stomach is within normal limits. Appendix appears normal. No evidence of bowel wall thickening, distention, or inflammatory changes. Vascular/Lymphatic: There are atherosclerotic calcifications of the aorta. No enlarged abdominal or pelvic lymph nodes. There surgical clips along the pelvic sidewalls. Reproductive: Status post hysterectomy. No adnexal masses. Other: There is trace free fluid in the pelvis. Musculoskeletal: No fracture is seen. Degenerative changes affect the spine.  IMPRESSION: 1. Trace free fluid in the pelvis. 2. Normal appendix. 3. Fatty infiltration of the liver. Aortic Atherosclerosis (ICD10-I70.0). Electronically Signed   By: Darliss Cheney M.D.   On: 07/30/2022 18:20    Procedures Procedures    Medications Ordered in ED Medications - No data to display  ED Course/ Medical Decision Making/ A&P                             Medical Decision Making Risk Prescription drug management.   Patient return to the emergency department shortly after being discharged after evaluation last night for abdominal pain with reassuring labs and CT.  Reports that her symptoms started to return while she was waiting at bus stop.  Of note patient has been prescribed multiple medications to treat her symptoms but has not gone to the pharmacy to pick them up yet.  She has not had any medications since midnight.  Her abdominal exam is reassuring and vitals are normal.  Patient does not have any new or worsening symptoms and given reassuring workup from last night I feel it is reasonable to give patient additional dose of pain and nausea medication and then continue with plan for discharge encourage patient to pick up the medications that she was prescribed and follow-up with GI as planned.  At this time there does not appear to be any evidence of an acute emergency medical condition requiring further emergent evaluation and the patient appears stable for discharge with appropriate outpatient follow up. Diagnosis and return precautions discussed with patient who verbalizes understanding and is agreeable to discharge.           Final Clinical Impression(s) / ED Diagnoses Final diagnoses:  Nausea  Abdominal pain, unspecified abdominal location    Rx / DC Orders ED Discharge Orders     None         Legrand Rams 07/31/22 1251    Lorre Nick, MD 07/31/22 1451

## 2022-07-31 NOTE — ED Notes (Signed)
Took and explained d/c papers and advised pt to pick up meds from previous d/c papers.  Pt vitals updated.  Pt kept ignoring my requests for her to leave and wanted to lay back down.  I advised her that she will have to go to the waiting room to use phone to call for ride several times.  Pt door left open and she was advised she is discharged.  She will not leave and wants to lay down.

## 2022-07-31 NOTE — ED Triage Notes (Signed)
Pt states she was waiting at the bus stop about 7m and stomach pain/nausea started to come back. Seen last night for same, dc 0745

## 2022-07-31 NOTE — Discharge Instructions (Signed)
Pick up the medications you were prescribed from your ED encounter last night and follow-up with your primary care provider and GI doctor as planned.

## 2022-08-01 LAB — CULTURE, BLOOD (ROUTINE X 2): Culture: NO GROWTH

## 2022-08-02 ENCOUNTER — Inpatient Hospital Stay (HOSPITAL_BASED_OUTPATIENT_CLINIC_OR_DEPARTMENT_OTHER)
Admission: EM | Admit: 2022-08-02 | Discharge: 2022-08-06 | DRG: 690 | Disposition: A | Discharge status: Home / Self Care | Payer: Commercial Managed Care - HMO | Attending: Internal Medicine | Admitting: Internal Medicine

## 2022-08-02 ENCOUNTER — Encounter (HOSPITAL_BASED_OUTPATIENT_CLINIC_OR_DEPARTMENT_OTHER): Payer: Self-pay | Admitting: Emergency Medicine

## 2022-08-02 ENCOUNTER — Other Ambulatory Visit: Payer: Self-pay

## 2022-08-02 DIAGNOSIS — N182 Chronic kidney disease, stage 2 (mild): Secondary | ICD-10-CM | POA: Diagnosis not present

## 2022-08-02 DIAGNOSIS — N390 Urinary tract infection, site not specified: Secondary | ICD-10-CM

## 2022-08-02 DIAGNOSIS — E785 Hyperlipidemia, unspecified: Secondary | ICD-10-CM | POA: Diagnosis not present

## 2022-08-02 DIAGNOSIS — T383X6A Underdosing of insulin and oral hypoglycemic [antidiabetic] drugs, initial encounter: Secondary | ICD-10-CM | POA: Diagnosis present

## 2022-08-02 DIAGNOSIS — E1065 Type 1 diabetes mellitus with hyperglycemia: Secondary | ICD-10-CM | POA: Diagnosis not present

## 2022-08-02 DIAGNOSIS — E860 Dehydration: Secondary | ICD-10-CM

## 2022-08-02 DIAGNOSIS — Z91148 Patient's other noncompliance with medication regimen for other reason: Secondary | ICD-10-CM

## 2022-08-02 DIAGNOSIS — N179 Acute kidney failure, unspecified: Secondary | ICD-10-CM | POA: Diagnosis not present

## 2022-08-02 DIAGNOSIS — I2510 Atherosclerotic heart disease of native coronary artery without angina pectoris: Secondary | ICD-10-CM | POA: Diagnosis not present

## 2022-08-02 DIAGNOSIS — Z7951 Long term (current) use of inhaled steroids: Secondary | ICD-10-CM | POA: Diagnosis not present

## 2022-08-02 DIAGNOSIS — Z7985 Long-term (current) use of injectable non-insulin antidiabetic drugs: Secondary | ICD-10-CM | POA: Diagnosis not present

## 2022-08-02 DIAGNOSIS — E1142 Type 2 diabetes mellitus with diabetic polyneuropathy: Secondary | ICD-10-CM | POA: Diagnosis not present

## 2022-08-02 DIAGNOSIS — E8729 Other acidosis: Secondary | ICD-10-CM

## 2022-08-02 DIAGNOSIS — E1022 Type 1 diabetes mellitus with diabetic chronic kidney disease: Secondary | ICD-10-CM | POA: Diagnosis present

## 2022-08-02 DIAGNOSIS — Z79899 Other long term (current) drug therapy: Secondary | ICD-10-CM

## 2022-08-02 DIAGNOSIS — E872 Acidosis, unspecified: Secondary | ICD-10-CM | POA: Diagnosis present

## 2022-08-02 DIAGNOSIS — K219 Gastro-esophageal reflux disease without esophagitis: Secondary | ICD-10-CM | POA: Diagnosis present

## 2022-08-02 DIAGNOSIS — N10 Acute tubulo-interstitial nephritis: Secondary | ICD-10-CM

## 2022-08-02 DIAGNOSIS — Z89422 Acquired absence of other left toe(s): Secondary | ICD-10-CM

## 2022-08-02 DIAGNOSIS — I429 Cardiomyopathy, unspecified: Secondary | ICD-10-CM | POA: Diagnosis present

## 2022-08-02 DIAGNOSIS — Z87891 Personal history of nicotine dependence: Secondary | ICD-10-CM

## 2022-08-02 DIAGNOSIS — B9620 Unspecified Escherichia coli [E. coli] as the cause of diseases classified elsewhere: Secondary | ICD-10-CM | POA: Diagnosis present

## 2022-08-02 DIAGNOSIS — I129 Hypertensive chronic kidney disease with stage 1 through stage 4 chronic kidney disease, or unspecified chronic kidney disease: Secondary | ICD-10-CM | POA: Diagnosis not present

## 2022-08-02 DIAGNOSIS — Z794 Long term (current) use of insulin: Secondary | ICD-10-CM | POA: Diagnosis not present

## 2022-08-02 DIAGNOSIS — E1043 Type 1 diabetes mellitus with diabetic autonomic (poly)neuropathy: Secondary | ICD-10-CM | POA: Diagnosis not present

## 2022-08-02 DIAGNOSIS — Z885 Allergy status to narcotic agent status: Secondary | ICD-10-CM

## 2022-08-02 DIAGNOSIS — Z888 Allergy status to other drugs, medicaments and biological substances status: Secondary | ICD-10-CM

## 2022-08-02 DIAGNOSIS — Z8541 Personal history of malignant neoplasm of cervix uteri: Secondary | ICD-10-CM | POA: Diagnosis not present

## 2022-08-02 DIAGNOSIS — Z91040 Latex allergy status: Secondary | ICD-10-CM

## 2022-08-02 DIAGNOSIS — N189 Chronic kidney disease, unspecified: Secondary | ICD-10-CM

## 2022-08-02 DIAGNOSIS — R112 Nausea with vomiting, unspecified: Principal | ICD-10-CM

## 2022-08-02 DIAGNOSIS — G894 Chronic pain syndrome: Secondary | ICD-10-CM | POA: Diagnosis not present

## 2022-08-02 DIAGNOSIS — Z7984 Long term (current) use of oral hypoglycemic drugs: Secondary | ICD-10-CM

## 2022-08-02 DIAGNOSIS — Z9049 Acquired absence of other specified parts of digestive tract: Secondary | ICD-10-CM

## 2022-08-02 DIAGNOSIS — K3184 Gastroparesis: Secondary | ICD-10-CM | POA: Diagnosis present

## 2022-08-02 DIAGNOSIS — R109 Unspecified abdominal pain: Secondary | ICD-10-CM

## 2022-08-02 DIAGNOSIS — E1165 Type 2 diabetes mellitus with hyperglycemia: Secondary | ICD-10-CM | POA: Diagnosis present

## 2022-08-02 DIAGNOSIS — Z7982 Long term (current) use of aspirin: Secondary | ICD-10-CM

## 2022-08-02 DIAGNOSIS — Z833 Family history of diabetes mellitus: Secondary | ICD-10-CM

## 2022-08-02 DIAGNOSIS — Z90711 Acquired absence of uterus with remaining cervical stump: Secondary | ICD-10-CM

## 2022-08-02 HISTORY — DX: Acute pyelonephritis: N10

## 2022-08-02 HISTORY — DX: Other acidosis: E87.29

## 2022-08-02 HISTORY — DX: Dehydration: E86.0

## 2022-08-02 LAB — RAPID URINE DRUG SCREEN, HOSP PERFORMED
Amphetamines: NOT DETECTED
Barbiturates: NOT DETECTED
Benzodiazepines: NOT DETECTED
Cocaine: NOT DETECTED
Opiates: POSITIVE — AB
Tetrahydrocannabinol: NOT DETECTED

## 2022-08-02 LAB — BASIC METABOLIC PANEL
Anion gap: 18 — ABNORMAL HIGH (ref 5–15)
Anion gap: 10 (ref 5–15)
BUN: 24 mg/dL — ABNORMAL HIGH (ref 6–20)
BUN: 17 mg/dL (ref 6–20)
CO2: 24 mmol/L (ref 22–32)
CO2: 25 mmol/L (ref 22–32)
Calcium: 9.1 mg/dL (ref 8.9–10.3)
Calcium: 8.1 mg/dL — ABNORMAL LOW (ref 8.9–10.3)
Chloride: 95 mmol/L — ABNORMAL LOW (ref 98–111)
Chloride: 102 mmol/L (ref 98–111)
Creatinine, Ser: 1.42 mg/dL — ABNORMAL HIGH (ref 0.44–1.00)
Creatinine, Ser: 1.06 mg/dL — ABNORMAL HIGH (ref 0.44–1.00)
GFR, Estimated: 43 mL/min — ABNORMAL LOW (ref >60)
GFR, Estimated: >60 mL/min (ref >60)
Glucose, Bld: 217 mg/dL — ABNORMAL HIGH (ref 70–99)
Glucose, Bld: 165 mg/dL — ABNORMAL HIGH (ref 70–99)
Potassium: 3.5 mmol/L (ref 3.5–5.1)
Potassium: 2.9 mmol/L — ABNORMAL LOW (ref 3.5–5.1)
Sodium: 137 mmol/L (ref 135–145)
Sodium: 137 mmol/L (ref 135–145)

## 2022-08-02 LAB — CBC WITH DIFFERENTIAL/PLATELET
Abs Immature Granulocytes: 0.08 10*3/uL — ABNORMAL HIGH (ref 0.00–0.07)
Basophils Absolute: 0 10*3/uL (ref 0.0–0.1)
Basophils Relative: 0 %
Eosinophils Absolute: 0.8 10*3/uL — ABNORMAL HIGH (ref 0.0–0.5)
Eosinophils Relative: 6 %
HCT: 42.9 % (ref 36.0–46.0)
Hemoglobin: 14.5 g/dL (ref 12.0–15.0)
Immature Granulocytes: 1 %
Lymphocytes Relative: 27 %
Lymphs Abs: 3.3 10*3/uL (ref 0.7–4.0)
MCH: 28.7 pg (ref 26.0–34.0)
MCHC: 33.8 g/dL (ref 30.0–36.0)
MCV: 84.8 fL (ref 80.0–100.0)
Monocytes Absolute: 1 10*3/uL (ref 0.1–1.0)
Monocytes Relative: 8 %
Neutro Abs: 7.1 10*3/uL (ref 1.7–7.7)
Neutrophils Relative %: 58 %
Platelets: 335 10*3/uL (ref 150–400)
RBC: 5.06 MIL/uL (ref 3.87–5.11)
RDW: 13.8 % (ref 11.5–15.5)
WBC: 12.4 10*3/uL — ABNORMAL HIGH (ref 4.0–10.5)
nRBC: 0 % (ref 0.0–0.2)

## 2022-08-02 LAB — GLUCOSE, CAPILLARY
Glucose-Capillary: 131 mg/dL — ABNORMAL HIGH (ref 70–99)
Glucose-Capillary: 154 mg/dL — ABNORMAL HIGH (ref 70–99)
Glucose-Capillary: 165 mg/dL — ABNORMAL HIGH (ref 70–99)
Glucose-Capillary: 168 mg/dL — ABNORMAL HIGH (ref 70–99)

## 2022-08-02 LAB — I-STAT VENOUS BLOOD GAS, ED
Acid-base deficit: 11 mmol/L — ABNORMAL HIGH (ref 0.0–2.0)
Bicarbonate: 13.7 mmol/L — ABNORMAL LOW (ref 20.0–28.0)
Calcium, Ion: 0.45 mmol/L — CL (ref 1.15–1.40)
HCT: 24 % — ABNORMAL LOW (ref 36.0–46.0)
Hemoglobin: 8.2 g/dL — ABNORMAL LOW (ref 12.0–15.0)
O2 Saturation: 91 %
Potassium: <2 mmol/L — CL (ref 3.5–5.1)
Sodium: 156 mmol/L — ABNORMAL HIGH (ref 135–145)
TCO2: 14 mmol/L — ABNORMAL LOW (ref 22–32)
pCO2, Ven: 24.6 mmHg — ABNORMAL LOW (ref 44–60)
pH, Ven: 7.355 (ref 7.25–7.43)
pO2, Ven: 62 mmHg — ABNORMAL HIGH (ref 32–45)

## 2022-08-02 LAB — URINALYSIS, ROUTINE W REFLEX MICROSCOPIC
Bilirubin Urine: NEGATIVE
Glucose, UA: NEGATIVE mg/dL
Ketones, ur: 80 mg/dL — AB
Nitrite: NEGATIVE
Protein, ur: >=300 mg/dL — AB
Specific Gravity, Urine: >=1.03 (ref 1.005–1.030)
pH: 6 (ref 5.0–8.0)

## 2022-08-02 LAB — CULTURE, BLOOD (ROUTINE X 2)

## 2022-08-02 LAB — BETA-HYDROXYBUTYRIC ACID: Beta-Hydroxybutyric Acid: 0.4 mmol/L — ABNORMAL HIGH (ref 0.05–0.27)

## 2022-08-02 LAB — URINALYSIS, MICROSCOPIC (REFLEX): WBC, UA: >50 WBC/hpf (ref 0–5)

## 2022-08-02 LAB — MAGNESIUM: Magnesium: 1.6 mg/dL — ABNORMAL LOW (ref 1.7–2.4)

## 2022-08-02 LAB — LACTIC ACID, PLASMA: Lactic Acid, Venous: 2 mmol/L (ref 0.5–1.9)

## 2022-08-02 MED ORDER — METOCLOPRAMIDE HCL 5 MG/ML IJ SOLN
10.0000 mg | Freq: Once | INTRAMUSCULAR | Status: AC | PRN
  Administered 2022-08-02: 10 mg via INTRAVENOUS
  Filled 2022-08-02: qty 2

## 2022-08-02 MED ORDER — INSULIN ASPART 100 UNIT/ML IJ SOLN
0.0000 [IU] | INTRAMUSCULAR | Status: DC
  Administered 2022-08-02: 1 [IU] via SUBCUTANEOUS
  Administered 2022-08-03: 3 [IU] via SUBCUTANEOUS
  Administered 2022-08-03 (×3): 1 [IU] via SUBCUTANEOUS
  Administered 2022-08-03: 3 [IU] via SUBCUTANEOUS
  Administered 2022-08-04 (×2): 1 [IU] via SUBCUTANEOUS
  Administered 2022-08-04: 2 [IU] via SUBCUTANEOUS
  Administered 2022-08-04 – 2022-08-05 (×3): 1 [IU] via SUBCUTANEOUS
  Administered 2022-08-05 (×3): 2 [IU] via SUBCUTANEOUS
  Administered 2022-08-06 (×2): 1 [IU] via SUBCUTANEOUS
  Administered 2022-08-06: 2 [IU] via SUBCUTANEOUS

## 2022-08-02 MED ORDER — ALBUTEROL SULFATE HFA 108 (90 BASE) MCG/ACT IN AERS: 2.0000 | INHALATION_SPRAY | Freq: Four times a day (QID) | RESPIRATORY_TRACT | Status: DC | PRN

## 2022-08-02 MED ORDER — SODIUM CHLORIDE 0.9 % IV BOLUS
1000.0000 mL | Freq: Once | INTRAVENOUS | Status: AC
  Administered 2022-08-02: 1000 mL via INTRAVENOUS

## 2022-08-02 MED ORDER — ALBUTEROL SULFATE (2.5 MG/3ML) 0.083% IN NEBU: 2.5000 mg | INHALATION_SOLUTION | Freq: Four times a day (QID) | RESPIRATORY_TRACT | Status: DC | PRN

## 2022-08-02 MED ORDER — TOPIRAMATE 25 MG PO TABS
50.0000 mg | ORAL_TABLET | Freq: Two times a day (BID) | ORAL | Status: DC
  Administered 2022-08-02 – 2022-08-05 (×5): 50 mg via ORAL
  Filled 2022-08-02 (×6): qty 2

## 2022-08-02 MED ORDER — ONDANSETRON HCL 4 MG/2ML IJ SOLN
4.0000 mg | Freq: Once | INTRAMUSCULAR | Status: AC
  Administered 2022-08-02: 4 mg via INTRAVENOUS
  Filled 2022-08-02: qty 2

## 2022-08-02 MED ORDER — POTASSIUM CHLORIDE CRYS ER 20 MEQ PO TBCR
40.0000 meq | EXTENDED_RELEASE_TABLET | Freq: Once | ORAL | Status: AC
  Administered 2022-08-02: 40 meq via ORAL
  Filled 2022-08-02: qty 2

## 2022-08-02 MED ORDER — HYDROMORPHONE HCL 1 MG/ML IJ SOLN
0.5000 mg | INTRAMUSCULAR | Status: DC | PRN
  Administered 2022-08-02 (×3): 1 mg via INTRAVENOUS
  Administered 2022-08-03: 0.5 mg via INTRAVENOUS
  Administered 2022-08-03 – 2022-08-04 (×8): 1 mg via INTRAVENOUS
  Administered 2022-08-04: 0.5 mg via INTRAVENOUS
  Administered 2022-08-04 – 2022-08-06 (×7): 1 mg via INTRAVENOUS
  Filled 2022-08-02 (×22): qty 1

## 2022-08-02 MED ORDER — HYDRALAZINE HCL 20 MG/ML IJ SOLN
5.0000 mg | Freq: Four times a day (QID) | INTRAMUSCULAR | Status: DC | PRN
  Administered 2022-08-02: 5 mg via INTRAVENOUS
  Filled 2022-08-02: qty 1

## 2022-08-02 MED ORDER — OXYCODONE HCL 5 MG PO TABS
10.0000 mg | ORAL_TABLET | Freq: Four times a day (QID) | ORAL | Status: DC | PRN
  Administered 2022-08-03 – 2022-08-06 (×5): 10 mg via ORAL
  Filled 2022-08-02 (×6): qty 2

## 2022-08-02 MED ORDER — SODIUM CHLORIDE 0.9 % IV SOLN
1.0000 g | Freq: Once | INTRAVENOUS | Status: AC
  Administered 2022-08-02: 1 g via INTRAVENOUS
  Filled 2022-08-02: qty 10

## 2022-08-02 MED ORDER — NITROGLYCERIN 0.4 MG SL SUBL: 0.4000 mg | SUBLINGUAL_TABLET | SUBLINGUAL | Status: DC | PRN

## 2022-08-02 MED ORDER — FLUTICASONE FUROATE-VILANTEROL 100-25 MCG/ACT IN AEPB
1.0000 | INHALATION_SPRAY | Freq: Every day | RESPIRATORY_TRACT | Status: DC
  Administered 2022-08-02 – 2022-08-06 (×5): 1 via RESPIRATORY_TRACT
  Filled 2022-08-02: qty 28

## 2022-08-02 MED ORDER — SODIUM CHLORIDE 0.9 % IV SOLN
1.0000 g | INTRAVENOUS | Status: DC
  Administered 2022-08-03: 1 g via INTRAVENOUS
  Filled 2022-08-02: qty 10

## 2022-08-02 MED ORDER — SODIUM CHLORIDE 0.9 % IV SOLN: INTRAVENOUS | Status: AC

## 2022-08-02 MED ORDER — GABAPENTIN 400 MG PO CAPS
600.0000 mg | ORAL_CAPSULE | Freq: Three times a day (TID) | ORAL | Status: DC
  Administered 2022-08-02 – 2022-08-06 (×11): 600 mg via ORAL
  Filled 2022-08-02 (×11): qty 2

## 2022-08-02 MED ORDER — MAGNESIUM SULFATE 4 GM/100ML IV SOLN
4.0000 g | Freq: Once | INTRAVENOUS | Status: AC
  Administered 2022-08-03: 4 g via INTRAVENOUS
  Filled 2022-08-02: qty 100

## 2022-08-02 MED ORDER — FLUTICASONE FUROATE-VILANTEROL 100-25 MCG/INH IN AEPB: 1.0000 | INHALATION_SPRAY | Freq: Every day | RESPIRATORY_TRACT | Status: DC

## 2022-08-02 MED ORDER — POTASSIUM CHLORIDE 10 MEQ/100ML IV SOLN
10.0000 meq | INTRAVENOUS | Status: AC
  Administered 2022-08-02 – 2022-08-03 (×5): 10 meq via INTRAVENOUS
  Filled 2022-08-02 (×4): qty 100

## 2022-08-02 MED ORDER — HYDROMORPHONE HCL 1 MG/ML IJ SOLN
0.5000 mg | Freq: Once | INTRAMUSCULAR | Status: AC
  Administered 2022-08-02: 0.5 mg via INTRAVENOUS
  Filled 2022-08-02: qty 1

## 2022-08-02 MED ORDER — LACTATED RINGERS IV BOLUS
2000.0000 mL | Freq: Once | INTRAVENOUS | Status: AC
  Administered 2022-08-02: 2000 mL via INTRAVENOUS

## 2022-08-02 MED ORDER — FENTANYL CITRATE PF 50 MCG/ML IJ SOSY
100.0000 ug | PREFILLED_SYRINGE | Freq: Once | INTRAMUSCULAR | Status: AC
  Administered 2022-08-02: 100 ug via INTRAVENOUS
  Filled 2022-08-02: qty 2

## 2022-08-02 MED ORDER — SODIUM CHLORIDE 0.9% FLUSH
3.0000 mL | Freq: Two times a day (BID) | INTRAVENOUS | Status: DC
  Administered 2022-08-02 – 2022-08-05 (×6): 3 mL via INTRAVENOUS

## 2022-08-02 MED ORDER — ONDANSETRON HCL 4 MG/2ML IJ SOLN
4.0000 mg | Freq: Four times a day (QID) | INTRAMUSCULAR | Status: DC | PRN
  Administered 2022-08-02 – 2022-08-05 (×4): 4 mg via INTRAVENOUS
  Filled 2022-08-02 (×4): qty 2

## 2022-08-02 MED ORDER — PROCHLORPERAZINE EDISYLATE 10 MG/2ML IJ SOLN
10.0000 mg | Freq: Four times a day (QID) | INTRAMUSCULAR | Status: DC | PRN
  Administered 2022-08-02 – 2022-08-06 (×7): 10 mg via INTRAVENOUS
  Filled 2022-08-02 (×7): qty 2

## 2022-08-02 MED ORDER — LACTATED RINGERS IV SOLN: INTRAVENOUS | Status: DC

## 2022-08-02 MED ORDER — CARVEDILOL 25 MG PO TABS
25.0000 mg | ORAL_TABLET | Freq: Two times a day (BID) | ORAL | Status: DC
  Administered 2022-08-03 – 2022-08-06 (×7): 25 mg via ORAL
  Filled 2022-08-02 (×7): qty 1

## 2022-08-02 MED ORDER — BUSPIRONE HCL 5 MG PO TABS
15.0000 mg | ORAL_TABLET | Freq: Two times a day (BID) | ORAL | Status: DC
  Administered 2022-08-02 – 2022-08-06 (×8): 15 mg via ORAL
  Filled 2022-08-02 (×9): qty 3

## 2022-08-02 MED ORDER — DICYCLOMINE HCL 20 MG PO TABS
20.0000 mg | ORAL_TABLET | Freq: Two times a day (BID) | ORAL | Status: DC
  Administered 2022-08-02 – 2022-08-06 (×8): 20 mg via ORAL
  Filled 2022-08-02 (×9): qty 1

## 2022-08-02 MED ORDER — AMLODIPINE BESYLATE 5 MG PO TABS
5.0000 mg | ORAL_TABLET | Freq: Every day | ORAL | Status: DC
  Administered 2022-08-03 – 2022-08-06 (×4): 5 mg via ORAL
  Filled 2022-08-02 (×4): qty 1

## 2022-08-02 NOTE — ED Provider Notes (Addendum)
  Provider Note MRN:  [id number]  Arrival date & time: 08/02/22    ED Course and Medical Decision Making  Assumed care from L Molpus at shift change.  See note from prior team for complete details, in brief:  57 yo female Multiple ER visits over past month for the same Nausea/vomiting/abdominal pain CT a/p earlier this month x2 reviewed, CTPE reviewed as well Today with UTI, Cr elevated somewhat from baseline. UA w/ ketones, pH wnl, slight leukocytosis Anti emetics x3 and ivf w/o much improvement Parenteral analgesia  Per overnight team will call for admission   Clinical Course as of 08/02/22 0810  Sat Aug 02, 2022  0724 WBC, UA: >50 [SG]  0724 Bacteria, UA(!): MANY Concern for uti, given rocephin and urine culture ordered [SG]    Clinical Course User Index [SG] Sloan Leiter, DO     Spoke w/ Dr Edward Jolly who accepts pt for admit    .Critical Care  Performed by: Sloan Leiter, DO Authorized by: Sloan Leiter, DO   Critical care provider statement:    Critical care time (minutes):  31   Critical care time was exclusive of:  Separately billable procedures and treating other patients   Critical care was necessary to treat or prevent imminent or life-threatening deterioration of the following conditions:  Dehydration (intractable pain/vomiting/dehydration)   Critical care was time spent personally by me on the following activities:  Development of treatment plan with patient or surrogate, discussions with consultants, evaluation of patient's response to treatment, examination of patient, ordering and review of laboratory studies, ordering and review of radiographic studies, ordering and performing treatments and interventions, pulse oximetry, re-evaluation of patient's condition, review of old charts and obtaining history from patient or surrogate   Care discussed with: admitting provider     Final Clinical Impressions(s) / ED Diagnoses     ICD-10-CM   1. Intractable nausea  and vomiting  R11.2     2. Recurrent abdominal pain  R10.9     3. Lower urinary tract infectious disease  N39.0       ED Discharge Orders     None       Discharge Instructions   None        Sloan Leiter, DO 08/02/22 0809    Sloan Leiter, DO 08/02/22 [phone]

## 2022-08-02 NOTE — ED Triage Notes (Signed)
Pt here for worsened R side abd pain, that radiates to back. Pt denies diarrhea but states she has had multiple episodes of emesis, unable to keep anything down. Pt states she was dx with "stomach infections" and states that she came back for fluids and medications.

## 2022-08-02 NOTE — ED Provider Notes (Signed)
[facility] Provider Note: Lowella Dell, MD, FACEP  CSN: [id number] MRN: [id number] ARRIVAL: 08/02/22 at 0202 ROOM: MH09/MH09   CHIEF COMPLAINT  Abdominal Pain   HISTORY OF PRESENT ILLNESS  08/02/22 3:57 AM [name] is a 57 y.o. female who was seen on 07/27/2022 for 5 days of nausea, vomiting and diarrhea and associated abdominal pain.  She had a CT of the abdomen and pelvis which showed evidence of generalized acute colitis versus enterocolitis.  A CT angio chest was negative for PE but showed trace pleural effusions.  She was treated and discharged on Zofran.  She returned the next morning for the same symptoms and was treated and discharged home on Augmentin and dicyclomine.  She returned on 07/30/2022 for worsening abdominal pain with associated nausea, vomiting and diarrhea.  A repeat CT of the abdomen and pelvis was performed which showed trace free fluid in the pelvis and a normal appendix.  No bowel wall thickening, distention or inflammatory changes are seen at that time.  She was treated and discharged home with oxycodone and promethazine.  She returned to yet again on 08/02/2022 because her pain and nausea had come back.  She had not even left the hospital premises nor has she gotten her prescriptions filled.  She was treated and discharged.  She returns for 1/5 visit for worsening suprapubic abdominal pain radiating rightward into her back.  She denies diarrhea but states she is still having vomiting, unable to keep anything down.  She rates her pain as a 10 out of 10.   Past Medical History:  Diagnosis Date   Anxiety 01/22/2016   Cardiac murmur 02/01/2019   Cardiomyopathy, secondary (HCC) 09/06/2009   Qualifier: Diagnosis of  By: Jolene Provost   Formatting of this note might be different from the original. Overview:  Qualifier: Diagnosis of  By: Jolene Provost   Chronic kidney disease, stage III (moderate) (HCC) 05/23/2014    Chronic pain syndrome 01/23/2016   Coronary artery disease    30% lesions noted   Depression    Diabetes type 2, uncontrolled 05/23/2014   Diabetic gastroparesis associated with type 1 diabetes mellitus (HCC) 11/27/2016   DIABETIC PERIPHERAL NEUROPATHY 09/21/2009   Qualifier: Diagnosis of  By: Delrae Alfred MD, [name]     Essential hypertension 08/30/2009   Qualifier: Diagnosis of  By: Cristela Felt, CNA, Christy     Gastroesophageal reflux disease 07/30/2009   Formatting of this note might be different from the original. Overview:  Overview:  Qualifier: Diagnosis of  By: Delrae Alfred MD, Alfonso Patten: Diagnosis of  By: Wilmon Pali NP, Consuela Mimes of this note might be different from the original. Overview:  Qualifier: Diagnosis of  By: Delrae Alfred MD, Alfonso Patten: Diagnosis of  By: Wilmon Pali NP, Gunnar Fusi   GERD (gastroesophageal reflux disease)    History of cervical cancer 08/17/2015   Status post partial hysterectomy  Formatting of this note might be different from the original. Status post partial hysterectomy   Hyperlipidemia    INSOMNIA 09/21/2009   Qualifier: Diagnosis of  By: Delrae Alfred MD, [name]     Lumbar facet arthropathy 05/14/2016   Lumbar spinal stenosis 02/11/2018   Metatarsalgia of both feet 03/11/2017   Migraine 09/13/2012   Overview:  IMPRESSION: possible abd migraine   TOBACCO ABUSE 09/21/2009   Qualifier: Diagnosis of  By: Delrae Alfred MD, Beverely Low of this note might be different from the original. Overview:  Qualifier: Diagnosis  of  By: Delrae Alfred MD, [name]   Trigeminal neuralgia    ULCER-GASTRIC 09/28/2009   Qualifier: Diagnosis of  By: Wilmon Pali NP, Leeann Must, CANDIDAL 12/25/2009   Qualifier: Diagnosis of  By: Delrae Alfred MD, Hubbard Robinson D deficiency 04/25/2013    Past Surgical History:  Procedure Laterality Date   ABDOMINAL HYSTERECTOMY     partial   AMPUTATION Left 01/06/2022   Procedure: AMPUTATION 2nd TOE;  Surgeon: Nadara Mustard, MD;  Location: [facility] OR;  Service: Orthopedics;  Laterality: Left;   CHOLECYSTECTOMY     I & D EXTREMITY Left 04/24/2021   Procedure: LEFT LEG DEBRIDEMENT;  Surgeon: Nadara Mustard, MD;  Location: [facility] OR;  Service: Orthopedics;  Laterality: Left;   I & D EXTREMITY Left 05/01/2021   Procedure: LEFT KNEE DEBRIDEMENT;  Surgeon: Nadara Mustard, MD;  Location: [facility] OR;  Service: Orthopedics;  Laterality: Left;    Family History  Problem Relation Age of Onset   Diabetes Mother    Diabetes Father     Social History   Tobacco Use   Smoking status: Former    Types: Cigarettes    Quit date: 1990    Years since quitting: 34.4   Smokeless tobacco: Never  Vaping Use   Vaping Use: Never used  Substance Use Topics   Alcohol use: Yes    Comment: occasionally   Drug use: No    Prior to Admission medications   Medication Sig Start Date End Date Taking? Authorizing Provider  albuterol (VENTOLIN HFA) 108 (90 Base) MCG/ACT inhaler Inhale 2 puffs into the lungs every 6 (six) hours as needed for wheezing or shortness of breath. Patient taking differently: Inhale 2 puffs into the lungs 3 (three) times daily as needed for wheezing or shortness of breath. 08/03/20   Saguier, Ramon Dredge, PA-C  amLODipine (NORVASC) 5 MG tablet Take 1 tablet (5 mg total) by mouth daily. 01/13/22 01/08/23  Saguier, Ramon Dredge, PA-C  amoxicillin-clavulanate (AUGMENTIN) 875-125 MG tablet Take 1 tablet by mouth every 12 (twelve) hours. 07/28/22   Carmel Sacramento A, PA-C  aspirin 81 MG tablet Take 81 mg by mouth daily.    [provider]  atorvastatin (LIPITOR) 80 MG tablet Take 1 tablet (80 mg total) by mouth daily. 01/13/22   Saguier, Ramon Dredge, PA-C  busPIRone (BUSPAR) 15 MG tablet Take 1 tablet (15 mg total) by mouth 2 (two) times daily. 01/13/22   Saguier, Ramon Dredge, PA-C  carvedilol (COREG) 25 MG tablet Take 1 tablet (25 mg total) by mouth 2 (two) times daily with a meal. 06/18/22   Saguier, Ramon Dredge, PA-C  dicyclomine (BENTYL) 20  MG tablet Take 1 tablet (20 mg total) by mouth 2 (two) times daily. 07/28/22   Carmel Sacramento A, PA-C  fluticasone (FLONASE) 50 MCG/ACT nasal spray Place 2 sprays into both nostrils daily. 01/13/22   Saguier, Ramon Dredge, PA-C  fluticasone furoate-vilanterol (BREO ELLIPTA) 100-25 MCG/INH AEPB Inhale 1 puff into the lungs daily. 08/03/20   Saguier, Ramon Dredge, PA-C  gabapentin (NEURONTIN) 300 MG capsule TAKE 2 CAPSULES BY MOUTH THREE TIMES DAILY 07/28/22   Saguier, Ramon Dredge, PA-C  Insulin Glargine [facility]) 100 UNIT/ML INJECT 40 UNITS SUBCUTANEOUSLY AT BEDTIME 07/28/22   Saguier, Ramon Dredge, PA-C  Insulin Pen Needle (PEN NEEDLES 31GX5/16") 31G X 8 MM MISC Use daily as directed 03/28/22   Saguier, Ramon Dredge, PA-C  metFORMIN (GLUCOPHAGE-XR) 500 MG 24 hr tablet Take 1 tablet (500 mg total) by mouth daily with breakfast. 01/22/22  Saguier, Ramon Dredge, PA-C  methocarbamol (ROBAXIN) 750 MG tablet Take 1 tablet (750 mg total) by mouth every 8 (eight) hours as needed for muscle spasms. 05/19/22   Saguier, Ramon Dredge, PA-C  nitroGLYCERIN (NITROSTAT) 0.4 MG SL tablet Place 1 tablet (0.4 mg total) under the tongue every 5 (five) minutes as needed for chest pain. 03/21/21   Saguier, Ramon Dredge, PA-C  oxyCODONE (ROXICODONE) 5 MG immediate release tablet Take 1 tablet (5 mg total) by mouth every 4 (four) hours as needed for severe pain. 07/30/22   Tegeler, Canary Brim, MD  Oxycodone HCl 10 MG TABS Take 1 tablet (10 mg total) by mouth every 6 (six) hours as needed. 07/29/22   Sharlene Dory, DO  promethazine (PHENERGAN) 12.5 MG tablet Take 1 tablet (12.5 mg total) by mouth every 8 (eight) hours as needed for nausea or vomiting. 07/28/22   Carmel Sacramento A, PA-C  promethazine (PHENERGAN) 25 MG tablet Take 1 tablet (25 mg total) by mouth every 6 (six) hours as needed for nausea or vomiting. 07/30/22   Tegeler, Canary Brim, MD  Semaglutide (OZEMPIC, 0.25 OR 0.5 MG/DOSE, [place]) Inject 0.25 mg into the skin once a week. mondays    [provider]  topiramate (TOPAMAX) 50 MG tablet Take 1 tablet (50 mg total) by mouth 2 (two) times daily. 01/13/22   Saguier, Ramon Dredge, PA-C  VITAMIN D PO Take 1 capsule by mouth daily.    [provider]    Allergies Lisinopril, Oxycodone-acetaminophen, Insulin aspart, Latex, and Morphine   REVIEW OF SYSTEMS  Negative except as noted here or in the History of Present Illness.   PHYSICAL EXAMINATION  Initial Vital Signs Blood pressure (!) 146/98, pulse 93, temperature 98 F (36.7 C), temperature source Oral, resp. rate 18, SpO2 98 %.  Examination General: Well-developed, well-nourished female in no acute distress; appearance consistent with age of record HENT: normocephalic; atraumatic Eyes: Normal appearance Neck: supple Heart: regular rate and rhythm Lungs: clear to auscultation bilaterally Abdomen: soft; nondistended; suprapubic tenderness; bowel sounds present Extremities: No deformity; full range of motion; pulses normal Neurologic: Awake, alert and oriented; motor function intact in all extremities and symmetric; no facial droop Skin: Warm and dry Psychiatric: Grimacing   RESULTS  Summary of this visit's results, reviewed and interpreted by myself:   EKG Interpretation  Date/Time:    Ventricular Rate:    PR Interval:    QRS Duration:   QT Interval:    QTC Calculation:   R Axis:     Text Interpretation:         Laboratory Studies: Results for orders placed or performed during the hospital encounter of 08/02/22 (from the past 24 hour(s))  CBC with Differential     Status: Abnormal   Collection Time: 08/02/22  4:30 AM  Result Value Ref Range   WBC 12.4 (H) 4.0 - 10.5 K/uL   RBC 5.06 3.87 - 5.11 MIL/uL   Hemoglobin 14.5 12.0 - 15.0 g/dL   HCT 78.4 69.6 - 29.5 %   MCV 84.8 80.0 - 100.0 fL   MCH 28.7 26.0 - 34.0 pg   MCHC 33.8 30.0 - 36.0 g/dL   RDW 28.4 13.2 - 44.0 %   Platelets 335 150 - 400 K/uL   nRBC 0.0 0.0 - 0.2 %   Neutrophils Relative %  58 %   Neutro Abs 7.1 1.7 - 7.7 K/uL   Lymphocytes Relative 27 %   Lymphs Abs 3.3 0.7 - 4.0 K/uL   Monocytes Relative  8 %   Monocytes Absolute 1.0 0.1 - 1.0 K/uL   Eosinophils Relative 6 %   Eosinophils Absolute 0.8 (H) 0.0 - 0.5 K/uL   Basophils Relative 0 %   Basophils Absolute 0.0 0.0 - 0.1 K/uL   Immature Granulocytes 1 %   Abs Immature Granulocytes 0.08 (H) 0.00 - 0.07 K/uL  Basic metabolic panel     Status: Abnormal   Collection Time: 08/02/22  4:30 AM  Result Value Ref Range   Sodium 137 135 - 145 mmol/L   Potassium 3.5 3.5 - 5.1 mmol/L   Chloride 95 (L) 98 - 111 mmol/L   CO2 24 22 - 32 mmol/L   Glucose, Bld 217 (H) 70 - 99 mg/dL   BUN 24 (H) 6 - 20 mg/dL   Creatinine, Ser 2.54 (H) 0.44 - 1.00 mg/dL   Calcium 9.1 8.9 - 27.0 mg/dL   GFR, Estimated 43 (L) >60 mL/min   Anion gap 18 (H) 5 - 15  Urinalysis, Routine w reflex microscopic -Urine, Clean Catch     Status: Abnormal   Collection Time: 08/02/22  6:13 AM  Result Value Ref Range   Color, Urine YELLOW YELLOW   APPearance TURBID (A) CLEAR   Specific Gravity, Urine >=1.030 1.005 - 1.030   pH 6.0 5.0 - 8.0   Glucose, UA NEGATIVE NEGATIVE mg/dL   Hgb urine dipstick MODERATE (A) NEGATIVE   Bilirubin Urine NEGATIVE NEGATIVE   Ketones, ur 80 (A) NEGATIVE mg/dL   Protein, ur >=623 (A) NEGATIVE mg/dL   Nitrite NEGATIVE NEGATIVE   Leukocytes,Ua SMALL (A) NEGATIVE  Rapid urine drug screen (hospital performed)     Status: Abnormal   Collection Time: 08/02/22  6:13 AM  Result Value Ref Range   Opiates POSITIVE (A) NONE DETECTED   Cocaine NONE DETECTED NONE DETECTED   Benzodiazepines NONE DETECTED NONE DETECTED   Amphetamines NONE DETECTED NONE DETECTED   Tetrahydrocannabinol NONE DETECTED NONE DETECTED   Barbiturates NONE DETECTED NONE DETECTED  Urinalysis, Microscopic (reflex)     Status: Abnormal   Collection Time: 08/02/22  6:13 AM  Result Value Ref Range   RBC / HPF 0-5 0 - 5 RBC/hpf   WBC, UA >50 0 - 5 WBC/hpf    Bacteria, UA MANY (A) NONE SEEN   Squamous Epithelial / HPF 0-5 0 - 5 /HPF   Non Squamous Epithelial PRESENT (A) NONE SEEN   WBC Clumps PRESENT    Imaging Studies: No results found.  ED COURSE and MDM  Nursing notes, initial and subsequent vitals signs, including pulse oximetry, reviewed and interpreted by myself.  Vitals:   08/02/22 0454 08/02/22 0500 08/02/22 0530 08/02/22 0600  BP:  136/81 126/76 122/78  Pulse: 74 74 79 76  Resp: 20 18  16   Temp:  98 F (36.7 C)    TempSrc:      SpO2: 100% 100% 100% 100%   Medications  sodium chloride 0.9 % bolus 1,000 mL (has no administration in time range)  cefTRIAXone (ROCEPHIN) 1 g in sodium chloride 0.9 % 100 mL IVPB (has no administration in time range)  ondansetron (ZOFRAN) injection 4 mg (4 mg Intravenous Given 08/02/22 0434)  metoCLOPramide (REGLAN) injection 10 mg (10 mg Intravenous Given 08/02/22 0435)  fentaNYL (SUBLIMAZE) injection 100 mcg (100 mcg Intravenous Given 08/02/22 0437)  sodium chloride 0.9 % bolus 1,000 mL (0 mLs Intravenous Stopped 08/02/22 0556)   5:56 AM The patient is feeling somewhat better after IV fluids and medications but still  has some nausea and pain.  This being her fifth visit since 07/27/2022 with no clear cause of her symptomatology and persistent symptoms, I feel it is time to have her admitted to the hospital for a more formal workup.  I am especially concerned about her acute kidney injury.  Her GFR was 59 6 months ago and is now down to 43.  Creatinine was 1.106 months ago and is now 1.42.  It is possible this represents adverse reactions to Ozempic but she has not taken any Ozempic in 2 weeks.  Nevertheless, it has a 1 week half-life.  6:49 AM Patient still appears dehydrated per urinalysis after 1 L of fluid.  Will give an additional liter of fluid.  Her urinalysis is also consistent with a urinary tract infection and we will send her urine for culture and start her on Rocephin.  7:01 AM Signed out to  Dr. Wallace Cullens who will discuss the patient's care with the hospitalist service.   PROCEDURES  Procedures   ED DIAGNOSES     ICD-10-CM   1. Persistent vomiting in adult patient  R11.15     2. Recurrent abdominal pain  R10.9     3. Lower urinary tract infectious disease  N39.0          Donalda Job, MD 08/02/22 [phone]

## 2022-08-02 NOTE — H&P (Addendum)
History and Physical    Patient: [name] [id number] DOB: 07/10/1965 DOA: 08/02/2022 DOS: the patient was seen and examined on 08/02/2022 PCP: Esperanza Richters, PA-C  Patient coming from: Home  Chief Complaint:  Chief Complaint  Patient presents with   Abdominal Pain   HPI:  Transfer from med [facility]  57 year old woman with repeated ED visits for nausea, vomiting, abdominal pain.  CT abdomen 6/19 negative.  Recently on Ozempic but last dose 2 weeks ago.  Unable to tolerate fluid challenge.  Accepted for observation, IV fluids and antiemetics.  Patient reports 2 weeks history of nausea, vomiting and diarrhea.  Diarrhea resolved in the last few days but continues to have intractable nausea and vomiting.  Unable to eat for many days now or tolerate fluids by report.  No specific aggravating or alleviating factors.  Associated with right lower quadrant pain in the abdomen that radiates to the back.  No previous similar episodes noted.  No dysuria.  No history of kidney stones.  Chart review 6/16 ED n/v/d. Tolerated PO and discharged 6/17 ED nv/d/abd pain. Started Ozempic 3 weeks prior. CT chest NAD, CT abd/pelvis: enterocolitits. Discharged on Bentyl, Augmentin, promethazine 6/18 video visit: n/d/abd pain. Given Rx for oxycodone 6/19 ED n/v/d/abd pain. CT abd/pelvis improved, NAD. Suspected GI illness. Discharged home with pain medicine 6/20 ED n/back pain. Discharged    Review of Systems: Negative for fever, visual changes, sore throat, rash, chest pain, shortness of breath, dysuria, bleeding. Past Medical History:  Diagnosis Date   Anxiety 01/22/2016   Cardiac murmur 02/01/2019   Cardiomyopathy, secondary (HCC) 09/06/2009   Qualifier: Diagnosis of  By: Jolene Provost   Formatting of this note might be different from the original. Overview:  Qualifier: Diagnosis of  By: Jolene Provost   Chronic kidney disease, stage III (moderate)  (HCC) 05/23/2014   Chronic pain syndrome 01/23/2016   Coronary artery disease    30% lesions noted   Depression    Diabetes type 2, uncontrolled 05/23/2014   Diabetic gastroparesis associated with type 1 diabetes mellitus (HCC) 11/27/2016   DIABETIC PERIPHERAL NEUROPATHY 09/21/2009   Qualifier: Diagnosis of  By: Delrae Alfred MD, [name]     Essential hypertension 08/30/2009   Qualifier: Diagnosis of  By: Cristela Felt, CNA, Christy     Gastroesophageal reflux disease 07/30/2009   Formatting of this note might be different from the original. Overview:  Overview:  Qualifier: Diagnosis of  By: Delrae Alfred MD, Alfonso Patten: Diagnosis of  By: Wilmon Pali NP, Consuela Mimes of this note might be different from the original. Overview:  Qualifier: Diagnosis of  By: Delrae Alfred MD, Alfonso Patten: Diagnosis of  By: Wilmon Pali NP, Gunnar Fusi   GERD (gastroesophageal reflux disease)    History of cervical cancer 08/17/2015   Status post partial hysterectomy  Formatting of this note might be different from the original. Status post partial hysterectomy   Hyperlipidemia    INSOMNIA 09/21/2009   Qualifier: Diagnosis of  By: Delrae Alfred MD, [name]     Lumbar facet arthropathy 05/14/2016   Lumbar spinal stenosis 02/11/2018   Metatarsalgia of both feet 03/11/2017   Migraine 09/13/2012   Overview:  IMPRESSION: possible abd migraine   TOBACCO ABUSE 09/21/2009   Qualifier: Diagnosis of  By: Delrae Alfred MD, Beverely Low of this note might be different from the original. Overview:  Qualifier: Diagnosis of  By: Delrae Alfred MD, [name]   Trigeminal neuralgia    ULCER-GASTRIC 09/28/2009  Qualifier: Diagnosis of  By: Wilmon Pali NP, Leeann Must, CANDIDAL 12/25/2009   Qualifier: Diagnosis of  By: Delrae Alfred MD, Hubbard Robinson D deficiency 04/25/2013   Past Surgical History:  Procedure Laterality Date   ABDOMINAL HYSTERECTOMY     partial   AMPUTATION Left 01/06/2022   Procedure: AMPUTATION 2nd  TOE;  Surgeon: Nadara Mustard, MD;  Location: [facility] OR;  Service: Orthopedics;  Laterality: Left;   CHOLECYSTECTOMY     I & D EXTREMITY Left 04/24/2021   Procedure: LEFT LEG DEBRIDEMENT;  Surgeon: Nadara Mustard, MD;  Location: [facility] OR;  Service: Orthopedics;  Laterality: Left;   I & D EXTREMITY Left 05/01/2021   Procedure: LEFT KNEE DEBRIDEMENT;  Surgeon: Nadara Mustard, MD;  Location: [facility] OR;  Service: Orthopedics;  Laterality: Left;   Social History:  reports that she quit smoking about 34 years ago. Her smoking use included cigarettes. She has never used smokeless tobacco. She reports current alcohol use. She reports that she does not use drugs.  Allergies  Allergen Reactions   Lisinopril Swelling    Angioedema   Oxycodone-Acetaminophen Nausea Only   Insulin Aspart Swelling and Other (See Comments)    Rec'd Novolog on multiple admissions in 2023 with no reaction.   Latex Itching, Swelling and Rash   Morphine Itching and Rash    Family History  Problem Relation Age of Onset   Diabetes Mother    Diabetes Father     Prior to Admission medications   Medication Sig Start Date End Date Taking? Authorizing Provider  nitroGLYCERIN (NITROSTAT) 0.4 MG SL tablet Place 1 tablet (0.4 mg total) under the tongue every 5 (five) minutes as needed for chest pain. 03/21/21  Yes Saguier, Ramon Dredge, PA-C  Oxycodone HCl 10 MG TABS Take 1 tablet (10 mg total) by mouth every 6 (six) hours as needed. 07/29/22  Yes Sharlene Dory, DO  albuterol (VENTOLIN HFA) 108 (90 Base) MCG/ACT inhaler Inhale 2 puffs into the lungs every 6 (six) hours as needed for wheezing or shortness of breath. Patient taking differently: Inhale 2 puffs into the lungs 3 (three) times daily as needed for wheezing or shortness of breath. 08/03/20   Saguier, Ramon Dredge, PA-C  amLODipine (NORVASC) 5 MG tablet Take 1 tablet (5 mg total) by mouth daily. 01/13/22 01/08/23  Saguier, Ramon Dredge, PA-C  amoxicillin-clavulanate (AUGMENTIN) 875-125 MG tablet  Take 1 tablet by mouth every 12 (twelve) hours. 07/28/22   Carmel Sacramento A, PA-C  aspirin 81 MG tablet Take 81 mg by mouth daily.    [provider]  atorvastatin (LIPITOR) 80 MG tablet Take 1 tablet (80 mg total) by mouth daily. 01/13/22   Saguier, Ramon Dredge, PA-C  busPIRone (BUSPAR) 15 MG tablet Take 1 tablet (15 mg total) by mouth 2 (two) times daily. 01/13/22   Saguier, Ramon Dredge, PA-C  carvedilol (COREG) 25 MG tablet Take 1 tablet (25 mg total) by mouth 2 (two) times daily with a meal. 06/18/22   Saguier, Ramon Dredge, PA-C  dicyclomine (BENTYL) 20 MG tablet Take 1 tablet (20 mg total) by mouth 2 (two) times daily. 07/28/22   Carmel Sacramento A, PA-C  fluticasone (FLONASE) 50 MCG/ACT nasal spray Place 2 sprays into both nostrils daily. 01/13/22   Saguier, Ramon Dredge, PA-C  fluticasone furoate-vilanterol (BREO ELLIPTA) 100-25 MCG/INH AEPB Inhale 1 puff into the lungs daily. 08/03/20   Saguier, Ramon Dredge, PA-C  gabapentin (NEURONTIN) 300 MG capsule TAKE 2 CAPSULES BY MOUTH THREE TIMES DAILY 07/28/22  Saguier, Ramon Dredge, PA-C  Insulin Glargine [facility]) 100 UNIT/ML INJECT 40 UNITS SUBCUTANEOUSLY AT BEDTIME 07/28/22   Saguier, Ramon Dredge, PA-C  Insulin Pen Needle (PEN NEEDLES 31GX5/16") 31G X 8 MM MISC Use daily as directed 03/28/22   Saguier, Ramon Dredge, PA-C  metFORMIN (GLUCOPHAGE-XR) 500 MG 24 hr tablet Take 1 tablet (500 mg total) by mouth daily with breakfast. 01/22/22   Saguier, Ramon Dredge, PA-C  methocarbamol (ROBAXIN) 750 MG tablet Take 1 tablet (750 mg total) by mouth every 8 (eight) hours as needed for muscle spasms. 05/19/22   Saguier, Ramon Dredge, PA-C  oxyCODONE (ROXICODONE) 5 MG immediate release tablet Take 1 tablet (5 mg total) by mouth every 4 (four) hours as needed for severe pain. 07/30/22   Tegeler, Canary Brim, MD  promethazine (PHENERGAN) 12.5 MG tablet Take 1 tablet (12.5 mg total) by mouth every 8 (eight) hours as needed for nausea or vomiting. 07/28/22   Carmel Sacramento A, PA-C  promethazine (PHENERGAN) 25  MG tablet Take 1 tablet (25 mg total) by mouth every 6 (six) hours as needed for nausea or vomiting. 07/30/22   Tegeler, Canary Brim, MD  Semaglutide (OZEMPIC, 0.25 OR 0.5 MG/DOSE, [place]) Inject 0.25 mg into the skin once a week. mondays    [provider]  topiramate (TOPAMAX) 50 MG tablet Take 1 tablet (50 mg total) by mouth 2 (two) times daily. 01/13/22   Saguier, Ramon Dredge, PA-C  VITAMIN D PO Take 1 capsule by mouth daily.    [provider]    Physical Exam: Vitals:   08/02/22 0900 08/02/22 0915 08/02/22 0922 08/02/22 1034  BP: (!) 115/91  (!) 115/91 (!) 140/84  Pulse:  73 73 76  Resp:   18 16  Temp:   98.7 F (37.1 C) 98.5 F (36.9 C)  TempSrc:   Oral Oral  SpO2:  100% 100% 99%   Physical Exam Vitals reviewed.  Constitutional:      General: She is not in acute distress.    Appearance: She is not ill-appearing or toxic-appearing.  Eyes:     General:        Right eye: No discharge.        Left eye: No discharge.     Comments: Eyes appear grossly normal  Cardiovascular:     Rate and Rhythm: Normal rate and regular rhythm.     Heart sounds: No murmur heard. Pulmonary:     Effort: Pulmonary effort is normal. No respiratory distress.     Breath sounds: No wheezing, rhonchi or rales.  Abdominal:     General: There is no distension.     Palpations: Abdomen is soft.     Tenderness: There is abdominal tenderness (Right lower quadrant with radiation to right side CVA). There is right CVA tenderness. There is no guarding.  Musculoskeletal:     Right lower leg: No edema.     Left lower leg: No edema.  Skin:    Findings: No lesion.  Neurological:     General: No focal deficit present.     Mental Status: She is alert.  Psychiatric:        Mood and Affect: Mood normal.        Behavior: Behavior normal.     Data Reviewed: CBG stable Creatinine 1.42, baseline around 1 Anion gap 18 iSTAT questionable, suspect spurious CBC noted, Hgb 14.5, WBC 12.4 U/A  equivocal UDS noted  Assessment and Plan: Intractable nausea, vomiting with now resolved diarrhea with associated right lower quadrant pain and  right CVA tenderness. PMH diabetic gastroparesis Possible right-sided pyelonephritis Imaging of the abdomen and pelvis x 2 unrevealing.  No evidence to suggest nephrolithiasis.  Appendix appeared normal.  LFTs unremarkable.  Gallbladder surgically absent.  Exam which suggest pyelonephritis and patient does have a mild leukocytosis. Empiric ceftriaxone and follow urine culture, urine culture from 6/19 was negative. Anion gap elevation noted, check beta hydroxybutyrate and repeat BMP now to assess for DKA. Supportive care, antiemetics, pain control.  Abdomen nonacute.  Anion gap metabolic acidosis, blood sugar in the 200s.  Has not been compliant with insulin. Repeat BMP and beta hydroxybutyric acid now.  If elevated, treat for DKA.  Check lactic acid x 1.  No other likely etiologies for above.  Acute kidney injury superimposed on possible CKD stage II Aggressive IV fluids.  Trend BMP.  Diabetes mellitus type 2 with diabetic peripheral neuropathy Mildly hyperglycemic.  Last hemoglobin A1c in February 9 0.5. Sliding scale insulin for the moment pending blood work. Neurontin.    Advance Care Planning: Full  Consults: None  Family Communication: none  Severity of Illness: The appropriate patient status for this patient is OBSERVATION. Observation status is judged to be reasonable and necessary in order to provide the required intensity of service to ensure the patient's safety. The patient's presenting symptoms, physical exam findings, and initial radiographic and laboratory data in the context of their medical condition is felt to place them at decreased risk for further clinical deterioration. Furthermore, it is anticipated that the patient will be medically stable for discharge from the hospital within 2 midnights of admission.    Author: Brendia Sacks, MD 08/02/2022 12:39 PM  For on call review [email or web address].

## 2022-08-02 NOTE — ED Notes (Signed)
Called [facility] for transport at 9:02.

## 2022-08-02 NOTE — Progress Notes (Signed)
MD paged for critical lactic acid: 2.0

## 2022-08-03 DIAGNOSIS — N10 Acute tubulo-interstitial nephritis: Secondary | ICD-10-CM | POA: Diagnosis present

## 2022-08-03 DIAGNOSIS — E1065 Type 1 diabetes mellitus with hyperglycemia: Secondary | ICD-10-CM | POA: Diagnosis present

## 2022-08-03 DIAGNOSIS — I2510 Atherosclerotic heart disease of native coronary artery without angina pectoris: Secondary | ICD-10-CM | POA: Diagnosis present

## 2022-08-03 DIAGNOSIS — E8729 Other acidosis: Secondary | ICD-10-CM | POA: Diagnosis not present

## 2022-08-03 DIAGNOSIS — E872 Acidosis, unspecified: Secondary | ICD-10-CM | POA: Diagnosis present

## 2022-08-03 DIAGNOSIS — Z7982 Long term (current) use of aspirin: Secondary | ICD-10-CM | POA: Diagnosis not present

## 2022-08-03 DIAGNOSIS — E860 Dehydration: Secondary | ICD-10-CM | POA: Diagnosis present

## 2022-08-03 DIAGNOSIS — G894 Chronic pain syndrome: Secondary | ICD-10-CM | POA: Diagnosis present

## 2022-08-03 DIAGNOSIS — Z91148 Patient's other noncompliance with medication regimen for other reason: Secondary | ICD-10-CM | POA: Diagnosis not present

## 2022-08-03 DIAGNOSIS — E1043 Type 1 diabetes mellitus with diabetic autonomic (poly)neuropathy: Secondary | ICD-10-CM | POA: Diagnosis present

## 2022-08-03 DIAGNOSIS — I429 Cardiomyopathy, unspecified: Secondary | ICD-10-CM | POA: Diagnosis present

## 2022-08-03 DIAGNOSIS — E1165 Type 2 diabetes mellitus with hyperglycemia: Secondary | ICD-10-CM | POA: Diagnosis not present

## 2022-08-03 DIAGNOSIS — Z794 Long term (current) use of insulin: Secondary | ICD-10-CM | POA: Diagnosis not present

## 2022-08-03 DIAGNOSIS — I129 Hypertensive chronic kidney disease with stage 1 through stage 4 chronic kidney disease, or unspecified chronic kidney disease: Secondary | ICD-10-CM | POA: Diagnosis present

## 2022-08-03 DIAGNOSIS — Z7985 Long-term (current) use of injectable non-insulin antidiabetic drugs: Secondary | ICD-10-CM | POA: Diagnosis not present

## 2022-08-03 DIAGNOSIS — Z7951 Long term (current) use of inhaled steroids: Secondary | ICD-10-CM | POA: Diagnosis not present

## 2022-08-03 DIAGNOSIS — E1142 Type 2 diabetes mellitus with diabetic polyneuropathy: Secondary | ICD-10-CM | POA: Diagnosis present

## 2022-08-03 DIAGNOSIS — N179 Acute kidney failure, unspecified: Secondary | ICD-10-CM | POA: Diagnosis present

## 2022-08-03 DIAGNOSIS — Z8541 Personal history of malignant neoplasm of cervix uteri: Secondary | ICD-10-CM | POA: Diagnosis not present

## 2022-08-03 DIAGNOSIS — R112 Nausea with vomiting, unspecified: Secondary | ICD-10-CM

## 2022-08-03 DIAGNOSIS — E1022 Type 1 diabetes mellitus with diabetic chronic kidney disease: Secondary | ICD-10-CM | POA: Diagnosis present

## 2022-08-03 DIAGNOSIS — K3184 Gastroparesis: Secondary | ICD-10-CM | POA: Diagnosis present

## 2022-08-03 DIAGNOSIS — Z7984 Long term (current) use of oral hypoglycemic drugs: Secondary | ICD-10-CM | POA: Diagnosis not present

## 2022-08-03 DIAGNOSIS — Z79899 Other long term (current) drug therapy: Secondary | ICD-10-CM | POA: Diagnosis not present

## 2022-08-03 DIAGNOSIS — T383X6A Underdosing of insulin and oral hypoglycemic [antidiabetic] drugs, initial encounter: Secondary | ICD-10-CM | POA: Diagnosis present

## 2022-08-03 DIAGNOSIS — E785 Hyperlipidemia, unspecified: Secondary | ICD-10-CM | POA: Diagnosis present

## 2022-08-03 DIAGNOSIS — N182 Chronic kidney disease, stage 2 (mild): Secondary | ICD-10-CM | POA: Diagnosis present

## 2022-08-03 DIAGNOSIS — B9620 Unspecified Escherichia coli [E. coli] as the cause of diseases classified elsewhere: Secondary | ICD-10-CM | POA: Diagnosis present

## 2022-08-03 HISTORY — DX: Nausea with vomiting, unspecified: R11.2

## 2022-08-03 LAB — BASIC METABOLIC PANEL
Anion gap: 12 (ref 5–15)
BUN: 13 mg/dL (ref 6–20)
CO2: 21 mmol/L — ABNORMAL LOW (ref 22–32)
Calcium: 8.2 mg/dL — ABNORMAL LOW (ref 8.9–10.3)
Chloride: 102 mmol/L (ref 98–111)
Creatinine, Ser: 1.07 mg/dL — ABNORMAL HIGH (ref 0.44–1.00)
GFR, Estimated: >60 mL/min (ref >60)
Glucose, Bld: 146 mg/dL — ABNORMAL HIGH (ref 70–99)
Potassium: 3.7 mmol/L (ref 3.5–5.1)
Sodium: 135 mmol/L (ref 135–145)

## 2022-08-03 LAB — CBC
HCT: 47.2 % — ABNORMAL HIGH (ref 36.0–46.0)
Hemoglobin: 14.4 g/dL (ref 12.0–15.0)
MCH: 28.8 pg (ref 26.0–34.0)
MCHC: 30.5 g/dL (ref 30.0–36.0)
MCV: 94.4 fL (ref 80.0–100.0)
Platelets: 228 10*3/uL (ref 150–400)
RBC: 5 MIL/uL (ref 3.87–5.11)
RDW: 14 % (ref 11.5–15.5)
WBC: 12.1 10*3/uL — ABNORMAL HIGH (ref 4.0–10.5)
nRBC: 0 % (ref 0.0–0.2)

## 2022-08-03 LAB — GLUCOSE, CAPILLARY
Glucose-Capillary: 166 mg/dL — ABNORMAL HIGH (ref 70–99)
Glucose-Capillary: 105 mg/dL — ABNORMAL HIGH (ref 70–99)
Glucose-Capillary: 269 mg/dL — ABNORMAL HIGH (ref 70–99)
Glucose-Capillary: 264 mg/dL — ABNORMAL HIGH (ref 70–99)
Glucose-Capillary: 175 mg/dL — ABNORMAL HIGH (ref 70–99)

## 2022-08-03 LAB — CULTURE, BLOOD (ROUTINE X 2)

## 2022-08-03 MED ORDER — INSULIN GLARGINE-YFGN 100 UNIT/ML ~~LOC~~ SOLN
10.0000 [IU] | Freq: Every day | SUBCUTANEOUS | Status: DC
  Administered 2022-08-03 – 2022-08-05 (×3): 10 [IU] via SUBCUTANEOUS
  Filled 2022-08-03 (×4): qty 0.1

## 2022-08-03 MED ORDER — BASAGLAR KWIKPEN 100 UNIT/ML ~~LOC~~ SOPN: 10.0000 [IU] | PEN_INJECTOR | Freq: Every day | SUBCUTANEOUS | Status: DC

## 2022-08-03 MED ORDER — SODIUM CHLORIDE 0.9 % IV SOLN
2.0000 g | INTRAVENOUS | Status: DC
  Administered 2022-08-03 – 2022-08-06 (×4): 2 g via INTRAVENOUS
  Filled 2022-08-03 (×4): qty 20

## 2022-08-03 MED ORDER — INSULIN GLARGINE-YFGN 100 UNIT/ML ~~LOC~~ SOLN: 10.0000 [IU] | Freq: Every day | SUBCUTANEOUS | Status: DC

## 2022-08-03 NOTE — Progress Notes (Signed)
  Progress Note   Patient: [name] VZD:[id number] DOB: 01/19/1966 DOA: 08/02/2022     0 DOS: the patient was seen and examined on 08/03/2022   Brief hospital course: 57 year old woman with repeated ED visits for nausea, vomiting, abdominal pain. CT abdomen 6/19 negative. Recently on Ozempic but last dose 2 weeks ago. Unable to tolerate fluid challenge. Accepted for observation, IV fluids and antiemetics.   Consultants None  Procedures None  Assessment and Plan: Intractable nausea, vomiting with now resolved diarrhea with associated right lower quadrant pain and right CVA tenderness. PMH diabetic gastroparesis Possible right-sided pyelonephritis Imaging of the abdomen and pelvis x 2 unrevealing.  No evidence to suggest nephrolithiasis.  Appendix appeared normal.  LFTs unremarkable.  Gallbladder surgically absent.  Exam suggests pyelonephritis Mildly better today.  Continues to have significant right CVA tenderness.  Continue empiric ceftriaxone and follow urine culture, urine culture from 6/19 was negative. Anion gap elevation noted on admission, resolved spontaneously. Beta hydroxybutyrate was only mildly elevated. Continue supportive care, antiemetics, pain control.  Abdomen nonacute.  Trial liquid diet.   Anion gap metabolic acidosis, blood sugar in the 200s.  Has not been compliant with insulin. Resolved spontaneously.   Acute kidney injury superimposed on possible CKD stage II Resolved.   Diabetes mellitus type 2 with diabetic peripheral neuropathy Mildly hyperglycemic.  Last hemoglobin A1c in February 9.5. Sliding scale insulin Continue gabapentin     Subjective:  RN reports ongoing pain  Feels a little better Would like to try some liquids Some nausea Pain in back is still there, but less intense  Physical Exam: Vitals:   08/02/22 2021 08/03/22 0133 08/03/22 0517 08/03/22 1323  BP: 131/77 (!) 143/92 122/72 100/63  Pulse: 74 81 78 77  Resp: 18 18 18 18    Temp: 98.6 F (37 C) (!) 97.5 F (36.4 C) 97.8 F (36.6 C) 98.1 F (36.7 C)  TempSrc: Oral Oral Oral Oral  SpO2: 99% 97% 98% 95%  Weight:      Height:       Physical Exam Vitals reviewed.  Constitutional:      General: She is not in acute distress.    Appearance: She is not ill-appearing or toxic-appearing.  Cardiovascular:     Rate and Rhythm: Normal rate and regular rhythm.     Heart sounds: No murmur heard. Pulmonary:     Effort: Pulmonary effort is normal. No respiratory distress.     Breath sounds: No wheezing, rhonchi or rales.  Abdominal:     General: Abdomen is flat. There is no distension.     Palpations: Abdomen is soft.     Tenderness: There is abdominal tenderness (minimal RLQ). There is right CVA tenderness (moderate to gentle palpation). There is no guarding or rebound.  Musculoskeletal:     Comments: No pain over lower spine. Lifts both legs off bed without difficulty. Moves in bed w/o difficulty.  Neurological:     Mental Status: She is alert.  Psychiatric:        Mood and Affect: Mood normal.        Behavior: Behavior normal.     Data Reviewed: AFVSS CBG stable K+ 3.7 AG 12 Creatinine 1.42 > 1.06 > 1.07 Mg2+ was 1.6, was replaced WBC similar 12.1  Family Communication: none  Disposition: Status is: Inpatient   Planned Discharge Destination: Home    Time spent: 25 minutes  Author: Brendia Sacks, MD 08/03/2022 5:36 PM  For on call review [email or web address].

## 2022-08-04 DIAGNOSIS — N10 Acute tubulo-interstitial nephritis: Secondary | ICD-10-CM | POA: Diagnosis not present

## 2022-08-04 DIAGNOSIS — R112 Nausea with vomiting, unspecified: Secondary | ICD-10-CM | POA: Diagnosis not present

## 2022-08-04 DIAGNOSIS — E1165 Type 2 diabetes mellitus with hyperglycemia: Secondary | ICD-10-CM | POA: Diagnosis not present

## 2022-08-04 LAB — CULTURE, BLOOD (ROUTINE X 2)

## 2022-08-04 LAB — CBC
HCT: 44.9 % (ref 36.0–46.0)
Hemoglobin: 14 g/dL (ref 12.0–15.0)
MCH: 28.1 pg (ref 26.0–34.0)
MCHC: 31.2 g/dL (ref 30.0–36.0)
MCV: 90.2 fL (ref 80.0–100.0)
Platelets: 333 10*3/uL (ref 150–400)
RBC: 4.98 MIL/uL (ref 3.87–5.11)
RDW: 14.4 % (ref 11.5–15.5)
WBC: 12.4 10*3/uL — ABNORMAL HIGH (ref 4.0–10.5)
nRBC: 0 % (ref 0.0–0.2)

## 2022-08-04 LAB — URINE CULTURE: Culture: >=100000 — AB

## 2022-08-04 LAB — GLUCOSE, CAPILLARY
Glucose-Capillary: 102 mg/dL — ABNORMAL HIGH (ref 70–99)
Glucose-Capillary: 113 mg/dL — ABNORMAL HIGH (ref 70–99)
Glucose-Capillary: 159 mg/dL — ABNORMAL HIGH (ref 70–99)
Glucose-Capillary: 174 mg/dL — ABNORMAL HIGH (ref 70–99)
Glucose-Capillary: 181 mg/dL — ABNORMAL HIGH (ref 70–99)
Glucose-Capillary: 200 mg/dL — ABNORMAL HIGH (ref 70–99)
Glucose-Capillary: 203 mg/dL — ABNORMAL HIGH (ref 70–99)

## 2022-08-04 LAB — BASIC METABOLIC PANEL
Anion gap: 8 (ref 5–15)
BUN: 8 mg/dL (ref 6–20)
CO2: 25 mmol/L (ref 22–32)
Calcium: 8.8 mg/dL — ABNORMAL LOW (ref 8.9–10.3)
Chloride: 107 mmol/L (ref 98–111)
Creatinine, Ser: 1.21 mg/dL — ABNORMAL HIGH (ref 0.44–1.00)
GFR, Estimated: 52 mL/min — ABNORMAL LOW (ref >60)
Glucose, Bld: 90 mg/dL (ref 70–99)
Potassium: 3.9 mmol/L (ref 3.5–5.1)
Sodium: 140 mmol/L (ref 135–145)

## 2022-08-04 NOTE — TOC Initial Note (Signed)
Transition of Care [facility]) - Initial/Assessment Note   Patient Details  Name: [name] MRN: [id number] Date of Birth: [birth year]  [facility]) CM/SW Contact:    Ewing Schlein, LCSW Phone Number: 08/04/2022, 11:48 AM  Clinical Narrative: SDOH assessment indicated food insecurity. Patient agreeable to resources. Food pantry resources added to AVS.               Expected Discharge Plan: Home/Self Care Barriers to Discharge: No Barriers Identified  Patient Goals and CMS Choice Patient states their goals for this hospitalization and ongoing recovery are:: Get food pantry resources Choice offered to / list presented to : NA  Expected Discharge Plan and Services In-house Referral: Clinical Social Work Post Acute Care Choice: NA Living arrangements for the past 2 months: Apartment           DME Arranged: N/A DME Agency: NA  Prior Living Arrangements/Services Living arrangements for the past 2 months: Apartment Patient language and need for interpreter reviewed:: Yes Do you feel safe going back to the place where you live?: Yes      Need for Family Participation in Patient Care: No (Comment) Care giver support system in place?: Yes (comment) Criminal Activity/Legal Involvement Pertinent to Current Situation/Hospitalization: No - Comment as needed  Activities of Daily Living Home Assistive Devices/Equipment: None ADL Screening (condition at time of admission) Patient's cognitive ability adequate to safely complete daily activities?: Yes Is the patient deaf or have difficulty hearing?: No Does the patient have difficulty seeing, even when wearing glasses/contacts?: No Does the patient have difficulty concentrating, remembering, or making decisions?: No Patient able to express need for assistance with ADLs?: No Does the patient have difficulty dressing or bathing?: No Independently performs ADLs?: Yes (appropriate for developmental age) Does the patient have  difficulty walking or climbing stairs?: No Weakness of Legs: None Weakness of Arms/Hands: None  Emotional Assessment Appearance:: Appears stated age Attitude/Demeanor/Rapport: Engaged Affect (typically observed): Accepting Orientation: : Oriented to Self, Oriented to Place, Oriented to  Time, Oriented to Situation Alcohol / Substance Use: Not Applicable Psych Involvement: No (comment)  Admission diagnosis:  Dehydration [E86.0] Lower urinary tract infectious disease [N39.0] Recurrent abdominal pain [R10.9] Intractable nausea and vomiting [R11.2] Patient Active Problem List   Diagnosis Date Noted   Intractable nausea and vomiting 08/03/2022   Dehydration 08/02/2022   High anion gap metabolic acidosis 08/02/2022   Acute pyelonephritis 08/02/2022   Osteomyelitis of second toe of left foot (HCC) 01/05/2022   Mood disorder (HCC) 01/05/2022   Sepsis (HCC) 05/21/2021   Chronic diastolic HF (heart failure) (HCC) 05/21/2021   Abscess of left lower leg    Necrotizing fasciitis (HCC)    Cellulitis of left leg 04/22/2021   Sepsis due to cellulitis (HCC) 04/22/2021   Severe sepsis (HCC) 04/22/2021   Acute kidney injury superimposed on chronic kidney disease (HCC) 04/22/2021   Chest pain, unspecified 04/22/2021   Uncontrolled type 2 diabetes mellitus with hyperglycemia, without long-term current use of insulin (HCC) 04/22/2021   Pain of left calf 04/19/2021   Viral upper respiratory tract infection 11/30/2020   Trigeminal neuralgia    Cervical cancer (HCC)    Chronic back pain    Coronary artery disease    GERD (gastroesophageal reflux disease)    Hyperlipidemia    Hypertension    Dyslipidemia 02/01/2019   Cardiac murmur 02/01/2019   Lumbar spinal stenosis 02/11/2018   Neuropathy 07/20/2017   Right flank pain    Nausea & vomiting  07/07/2017   Acute cystitis 07/07/2017   CAD (coronary artery disease) 07/07/2017   Metatarsalgia of both feet 03/11/2017   Diabetic gastroparesis  associated with type 1 diabetes mellitus (HCC) 11/27/2016   Pure hypercholesterolemia 11/27/2016   Lumbar facet arthropathy 05/14/2016   Chronic pain syndrome 01/23/2016   Diabetic peripheral neuropathy (HCC) 01/23/2016   Anxiety 01/22/2016   History of cervical cancer 08/17/2015   Depression 08/10/2015   Overweight (BMI 25.0-29.9) 07/21/2014   Chronic kidney disease, stage III (moderate) (HCC) 05/23/2014   Diabetes type 2, uncontrolled 05/23/2014   Current use of insulin (HCC) 05/23/2014   Vitamin D deficiency 04/25/2013   Cerebrovascular disease 09/13/2012   Migraine 09/13/2012   Nonspecific abnormal findings on radiological and examination of skull and head 09/13/2012   Retention of urine 06/25/2011   ANKLE PAIN, LEFT 12/25/2009   DYSURIA 12/25/2009   VAGINITIS, CANDIDAL 12/25/2009   ULCER-GASTRIC 09/28/2009   DIABETIC PERIPHERAL NEUROPATHY 09/21/2009   TOBACCO ABUSE 09/21/2009   UTI (urinary tract infection) 09/21/2009   INSOMNIA 09/21/2009   TRANSAMINASES, SERUM, ELEVATED 09/21/2009   Elevation of level of transaminase or lactic acid dehydrogenase (LDH) 09/21/2009   Type 2 diabetes mellitus with stage 3 chronic kidney disease (HCC) 09/14/2009   Nausea with vomiting, unspecified 09/14/2009   ABDOMINAL PAIN-EPIGASTRIC 09/14/2009   Cardiomyopathy, secondary (HCC) 09/06/2009   Cardiomyopathy, unspecified (HCC) 09/06/2009   Essential hypertension 08/30/2009   DM2 (diabetes mellitus, type 2) (HCC) 07/30/2009   HLD (hyperlipidemia) 07/30/2009   GERD 07/30/2009   GASTROPARESIS 07/30/2009   CHEST PAIN, ATYPICAL 07/30/2009   POSITIVE PPD 07/30/2009   Type 2 diabetes mellitus with hyperglycemia (HCC) 07/30/2009   Gastric atony 07/30/2009   Gastroesophageal reflux disease 07/30/2009   Hyperlipidemia, unspecified 07/30/2009   Tuberculin test reaction 07/30/2009   PCP:  Esperanza Richters, PA-C Pharmacy:   [id number] [address] [place], [place] - [address] [place] [address] Phone: [phone] Fax: [phone]  Social Determinants of Health (SDOH) Social History: SDOH Screenings   Food Insecurity: Food Insecurity Present (08/02/2022)  Housing: Low Risk  (08/02/2022)  Transportation Needs: No Transportation Needs (08/02/2022)  Utilities: Not At Risk (08/02/2022)  Depression (PHQ2-9): Low Risk  (07/29/2022)  Tobacco Use: Medium Risk (08/02/2022)   SDOH Interventions: Food Insecurity Interventions: Inpatient TOC, Other (Comment) (Food pantry resources added to AVS.)  Readmission Risk Interventions    08/04/2022   11:48 AM  Readmission Risk Prevention Plan  Transportation Screening Complete  Medication Review Ocean[employer]) Complete  HRI or Home Care Consult Complete  SW Recovery Care/Counseling Consult Complete  Palliative Care Screening Not Applicable  Skilled Nursing Facility Not Applicable

## 2022-08-04 NOTE — Progress Notes (Addendum)
Patient requesting juice & peanut butter/crackers. Patient advised that she is on a clear liquid diet with hx of diabetes and we are unable to give her juice at this time and cracker are not on a clear liquid diet. Patient reports she has been eating crackers with peanut butter and juice all day, until I changed her diet. Patient advised that the diet was entered by the doctor. Patient given the options of what we have stocked on the floor for clear liquid diet. Patient given diet shasta lemon lime and broth, patient not receptive to following ordered diet.

## 2022-08-04 NOTE — Progress Notes (Signed)
Patient refused Topamax, MD made aware.

## 2022-08-04 NOTE — Progress Notes (Signed)
Progress Note   Patient: [name] FAO:[id number] DOB: 12-24-1965 DOA: 08/02/2022     1 DOS: the patient was seen and examined on 08/04/2022   Brief hospital course: 57 year old woman with repeated ED visits for nausea, vomiting, abdominal pain. CT abdomen 6/19 negative. Recently on Ozempic but last dose 2 weeks ago. Unable to tolerate fluid challenge. Accepted for observation, IV fluids and antiemetics.   Consultants None  Procedures None  Assessment and Plan: Intractable nausea, vomiting with now resolved diarrhea with associated right lower quadrant pain and right CVA tenderness. PMH diabetic gastroparesis Right-sided pyelonephritis Imaging of the abdomen and pelvis x 2 unrevealing.  No evidence to suggest nephrolithiasis.  Appendix appeared normal.  LFTs unremarkable.  Gallbladder surgically absent.  Exam suggests pyelonephritis Better today.  Less right CVA tenderness.  Will continue empiric ceftriaxone and follow urine culture > E coli >100k, sensitivity pending. Urine culture from 6/19 was negative. Anion gap elevation noted on admission, resolved spontaneously. Beta hydroxybutyrate was only mildly elevated. Continue supportive care, antiemetics, pain control.  Abdomen nonacute.  Advance diet, if tolerates, likely home tomorrow.   Anion gap metabolic acidosis, blood sugar in the 200s.  Has not been compliant with insulin. Resolved spontaneously.   Acute kidney injury superimposed on possible CKD stage II Resolved.   Diabetes mellitus type 2 with diabetic peripheral neuropathy Mildly hyperglycemic.  Last hemoglobin A1c in February 9.5. Continue sliding scale insulin. Can resume metformin on discharge and insulin as diet improves Continue gabapentin  Improving; likely home tomorrow.     Subjective:  Per RN note: "Patient requesting juice & peanut butter/crackers. Patient advised that she is on a clear liquid diet with hx of diabetes and we are unable to give her  juice at this time and cracker are not on a clear liquid diet. Patient reports she has been eating crackers with peanut butter and juice all day, until I changed her diet. Patient advised that the diet was entered by the doctor. Patient given the options of what we have stocked on the floor for clear liquid diet. Patient given diet shasta lemon lime and broth, patient not receptive to following ordered diet.   Pt still has some right flank pain, but feeling better. N/v is gone. Wants to advance diet. Feeling better. Hopes to go home tomorrow.  Physical Exam: Vitals:   08/03/22 0517 08/03/22 1323 08/03/22 2031 08/04/22 0258  BP: 122/72 100/63 115/77 119/77  Pulse: 78 77 80 80  Resp: 18 18 12 18   Temp: 97.8 F (36.6 C) 98.1 F (36.7 C) 98.6 F (37 C) 98.4 F (36.9 C)  TempSrc: Oral Oral Oral Oral  SpO2: 98% 95% 95% 92%  Weight:      Height:       Physical Exam Vitals and nursing note reviewed.  Constitutional:      General: She is not in acute distress.    Appearance: She is not ill-appearing or toxic-appearing.  Cardiovascular:     Rate and Rhythm: Normal rate and regular rhythm.     Heart sounds: No murmur heard. Pulmonary:     Effort: Pulmonary effort is normal. No respiratory distress.     Breath sounds: No wheezing or rhonchi.  Abdominal:     General: There is no distension.     Palpations: Abdomen is soft.     Tenderness: There is abdominal tenderness (mild RLQ pain). There is right CVA tenderness (mild). There is no guarding.  Neurological:     Mental Status:  She is alert.  Psychiatric:        Mood and Affect: Mood normal.        Behavior: Behavior normal.     Data Reviewed: CBG stable, fasting 159 Creatinine stable at 1.21 CBC noted, mild leukocytosis  Family Communication: none  Disposition: Status is: Inpatient Remains inpatient appropriate because: abd pain  Planned Discharge Destination: Home    Time spent: 20 minutes  Author: Brendia Sacks,  MD 08/04/2022 8:27 AM  For on call review [email or web address].

## 2022-08-05 DIAGNOSIS — N10 Acute tubulo-interstitial nephritis: Secondary | ICD-10-CM | POA: Diagnosis not present

## 2022-08-05 DIAGNOSIS — E1165 Type 2 diabetes mellitus with hyperglycemia: Secondary | ICD-10-CM | POA: Diagnosis not present

## 2022-08-05 DIAGNOSIS — R112 Nausea with vomiting, unspecified: Secondary | ICD-10-CM | POA: Diagnosis not present

## 2022-08-05 LAB — GLUCOSE, CAPILLARY
Glucose-Capillary: 173 mg/dL — ABNORMAL HIGH (ref 70–99)
Glucose-Capillary: 197 mg/dL — ABNORMAL HIGH (ref 70–99)
Glucose-Capillary: 211 mg/dL — ABNORMAL HIGH (ref 70–99)
Glucose-Capillary: 239 mg/dL — ABNORMAL HIGH (ref 70–99)
Glucose-Capillary: 249 mg/dL — ABNORMAL HIGH (ref 70–99)
Glucose-Capillary: 77 mg/dL (ref 70–99)

## 2022-08-05 NOTE — Progress Notes (Signed)
  Progress Note   Patient: [name] DOB: Jan 15, 1966 DOA: 08/02/2022     2 DOS: the patient was seen and examined on 08/05/2022   Brief hospital course: 57 year old woman with repeated ED visits for nausea, vomiting, abdominal pain. CT abdomen 6/19 negative. Recently on Ozempic but last dose 2 weeks ago. Unable to tolerate fluid challenge. Accepted for observation, IV fluids and antiemetics.  Condition slowly improved.  Symptomatology most suggestive of pyelonephritis.  Plan to continue IV antibiotics until tolerating diet and then can likely discharge home in the next 48 hours.  Consultants None  Procedures None  Assessment and Plan: Intractable nausea, vomiting with now resolved diarrhea with associated right lower quadrant pain and right CVA tenderness. E coli right-sided pyelonephritis Imaging of the abdomen and pelvis x 2 unrevealing.  No evidence to suggest nephrolithiasis.  Appendix appeared normal.  LFTs unremarkable.  Gallbladder surgically absent.  Exam suggests pyelonephritis Will continue ceftriaxone and transition to Cipro 500 BID on discharge to complete course. Continue supportive care, antiemetics, pain control.  If tolerates diet, likely home tomorrow.   Anion gap metabolic acidosis, blood sugar in the 200s.  Has not been compliant with insulin. nion gap elevation noted on admission, resolved spontaneously. Beta hydroxybutyrate was only mildly elevated.   Acute kidney injury superimposed on possible CKD stage II Resolved.   Diabetes mellitus type 2 with diabetic peripheral neuropathy PMH diabetic gastroparesis CBG stable. Last hemoglobin A1c in February 9.5. Continue sliding scale insulin. Can resume metformin on discharge and insulin as diet improves Continue gabapentin     Subjective:  Feels a little better, but had nausea yesterday and didn't eat much of anything Today would like to try eating. No vomiting today. Still has right flank  pain and some right lower quadrant pain  Physical Exam: Vitals:   08/04/22 2043 08/05/22 0536 08/05/22 0834 08/05/22 1124  BP: 125/83 108/80  139/77  Pulse: 87 92    Resp: 18 17  18   Temp: 98.6 F (37 C) 99 F (37.2 C)  98 F (36.7 C)  TempSrc: Oral Oral  Oral  SpO2: 92% 97% 93% 94%  Weight:      Height:       Physical Exam Vitals reviewed.  Constitutional:      General: She is not in acute distress.    Appearance: She is not ill-appearing or toxic-appearing.  Cardiovascular:     Rate and Rhythm: Normal rate and regular rhythm.     Heart sounds: No murmur heard. Pulmonary:     Effort: Pulmonary effort is normal. No respiratory distress.     Breath sounds: No wheezing, rhonchi or rales.  Abdominal:     General: There is no distension.     Palpations: Abdomen is soft.     Tenderness: There is abdominal tenderness (Mild RLQ pain). There is right CVA tenderness (Mild). There is no guarding.  Neurological:     Mental Status: She is alert.  Psychiatric:        Mood and Affect: Mood normal.        Behavior: Behavior normal.     Data Reviewed: CBG stable  Family Communication: none  Disposition: Status is: Inpatient Remains inpatient appropriate because: poor appetite  Planned Discharge Destination: Home    Time spent: 20 minutes  Author: Brendia Sacks, MD 08/05/2022 1:33 PM  For on call review [email or web address].

## 2022-08-06 DIAGNOSIS — R112 Nausea with vomiting, unspecified: Secondary | ICD-10-CM | POA: Diagnosis not present

## 2022-08-06 DIAGNOSIS — N10 Acute tubulo-interstitial nephritis: Secondary | ICD-10-CM | POA: Diagnosis not present

## 2022-08-06 LAB — GLUCOSE, CAPILLARY
Glucose-Capillary: 195 mg/dL — ABNORMAL HIGH (ref 70–99)
Glucose-Capillary: 175 mg/dL — ABNORMAL HIGH (ref 70–99)

## 2022-08-06 LAB — BASIC METABOLIC PANEL
Anion gap: 7 (ref 5–15)
BUN: 9 mg/dL (ref 6–20)
CO2: 26 mmol/L (ref 22–32)
Calcium: 8.2 mg/dL — ABNORMAL LOW (ref 8.9–10.3)
Chloride: 106 mmol/L (ref 98–111)
Creatinine, Ser: 1.29 mg/dL — ABNORMAL HIGH (ref 0.44–1.00)
GFR, Estimated: 48 mL/min — ABNORMAL LOW (ref >60)
Glucose, Bld: 158 mg/dL — ABNORMAL HIGH (ref 70–99)
Potassium: 3.5 mmol/L (ref 3.5–5.1)
Sodium: 139 mmol/L (ref 135–145)

## 2022-08-06 NOTE — Discharge Summary (Signed)
Physician Discharge Summary  [name]:[id number] DOB: 1965-12-09  PCP: Esperanza Richters, PA-C  Admitted from: Home Discharged to: Home  Admit date: 08/02/2022 Discharge date: 08/06/2022  Recommendations for Outpatient Follow-up:    Follow-up Information     [facility] Follow up.   Contact information: [address] [phone]        [facility] Follow up.   Contact information: [employer] [phone]        [facility] Follow up.   Contact information: [address] [employer] [phone]        [facility] Follow up.   Contact information: [address] [phone]        [facility] Follow up.   Contact information: [address] [place], [place] [address] [phone]        Saguier, Ramon Dredge, PA-C. Schedule an appointment as soon as possible for a visit in 1 week(s).   Specialties: Internal Medicine, Family Medicine Why: To be seen with repeat labs (CBC & BMP). Contact information: [address] [place] [address] [phone]                  Home Health: None    Equipment/Devices: None    Discharge Condition: Improved and stable.   Code Status: Full Code Diet recommendation:  Discharge Diet Orders (From admission, onward)     Start     Ordered   08/06/22 0000  Diet - low sodium heart healthy        08/06/22 1056   08/06/22 0000  Diet Carb Modified        08/06/22 1056             Discharge Diagnoses:  Principal Problem:   Acute pyelonephritis Active Problems:   Acute kidney injury superimposed on chronic kidney disease (HCC)   Uncontrolled type 2 diabetes mellitus with hyperglycemia, without long-term current use of insulin (HCC)   Dehydration   High anion gap metabolic acidosis   Intractable nausea and vomiting   Brief  Summary: 57 year old woman with repeated ED visits for nausea, vomiting, abdominal pain. CT abdomen 6/19 negative. Recently on Ozempic but last dose 2 weeks ago. Unable to tolerate fluid challenge. Accepted for observation, IV fluids and antiemetics. Condition slowly improved.  Also treated for suspected pyelonephritis as cause for her presentation.  Improved with support of treatment and IV antibiotics.  Assessment and Plan: Intractable nausea, vomiting with now resolved diarrhea with associated right lower quadrant pain and right CVA tenderness. E coli right-sided pyelonephritis CT abdomen and pelvis on 6/17 had shown evidence of acute colitis versus enterocolitis and subsequent CT abdomen and pelvis on 6/19 was really unremarkable.  Although completed treatment for pyelonephritis, unclear if this was the primary cause of her initial presentation or due to acute GE. Urine culture from 6/22 showed E. coli that was sensitive to ceftriaxone.  Completed 5 days of IV ceftriaxone, completed treatment.  Do not see need for any further antibiotics at discharge. Improved on supportive care with bowel rest, multimodality pain control, antiemetics, IV fluids, gradual diet advancement.   Anion gap metabolic acidosis Anion gap elevation noted on admission, resolved spontaneously. Beta hydroxybutyrate was only mildly elevated.   Acute kidney injury  She may have underlying stage II chronic kidney disease or even stage IIIa chronic kidney disease  and this is not clear at this time. Creatinine was peaked on admit and at 1.56.  This has improved and fluctuated during the course of hospitalization.  Creatinine in the 1.2 range for the last 3 days. Advised adequate hydration, avoid nephrotoxics and close outpatient follow-up with repeat BMP.   Diabetes mellitus type 2 with diabetic peripheral neuropathy PMH diabetic gastroparesis Last hemoglobin A1c in February 9.5. At time of discharge, continue prior home dose  of metformin, Basaglar home dose.  However continue to hold Ozempic which may also be the cause of her initial presentation.  This can be reassessed during close outpatient follow-up with PCP. Continue gabapentin   Hypertension: Controlled.  Continue home dose of amlodipine 5 Mg daily, carvedilol 25 Mg twice daily.  Hyperlipidemia Continue home dose of atorvastatin 80 Mg daily.  Consultations: None  Procedures: None   Discharge Instructions  Discharge Instructions     Call MD for:  difficulty breathing, headache or visual disturbances   Complete by: As directed    Call MD for:  extreme fatigue   Complete by: As directed    Call MD for:  persistant dizziness or light-headedness   Complete by: As directed    Call MD for:  persistant nausea and vomiting   Complete by: As directed    Call MD for:  severe uncontrolled pain   Complete by: As directed    Call MD for:  temperature >100.4   Complete by: As directed    Diet - low sodium heart healthy   Complete by: As directed    Diet Carb Modified   Complete by: As directed    Increase activity slowly   Complete by: As directed         Medication List     STOP taking these medications    amoxicillin-clavulanate 875-125 MG tablet Commonly known as: AUGMENTIN   OZEMPIC (0.25 OR 0.5 MG/DOSE) [place]       TAKE these medications    albuterol 108 (90 Base) MCG/ACT inhaler Commonly known as: VENTOLIN HFA Inhale 2 puffs into the lungs every 6 (six) hours as needed for wheezing or shortness of breath. What changed: when to take this   amLODipine 5 MG tablet Commonly known as: NORVASC Take 1 tablet (5 mg total) by mouth daily.   aspirin 81 MG tablet Take 81 mg by mouth daily.   atorvastatin 80 MG tablet Commonly known as: LIPITOR Take 1 tablet (80 mg total) by mouth daily.   Basaglar KwikPen 100 UNIT/ML INJECT 40 UNITS SUBCUTANEOUSLY AT BEDTIME What changed: See the new instructions.   busPIRone 15 MG  tablet Commonly known as: BUSPAR Take 1 tablet (15 mg total) by mouth 2 (two) times daily. What changed:  when to take this reasons to take this   carvedilol 25 MG tablet Commonly known as: COREG Take 1 tablet (25 mg total) by mouth 2 (two) times daily with a meal.   dicyclomine 20 MG tablet Commonly known as: BENTYL Take 1 tablet (20 mg total) by mouth 2 (two) times daily.   fluticasone 50 MCG/ACT nasal spray Commonly known as: FLONASE Place 2 sprays into both nostrils daily.   fluticasone furoate-vilanterol 100-25 MCG/INH Aepb Commonly known as: Breo Ellipta Inhale 1 puff into the lungs daily.   gabapentin 300 MG capsule Commonly known as: NEURONTIN TAKE 2 CAPSULES BY MOUTH THREE TIMES DAILY   metFORMIN 500 MG 24 hr tablet Commonly known as: GLUCOPHAGE-XR Take 1 tablet (500 mg total) by mouth  daily with breakfast.   methocarbamol 750 MG tablet Commonly known as: ROBAXIN Take 1 tablet (750 mg total) by mouth every 8 (eight) hours as needed for muscle spasms.   nitroGLYCERIN 0.4 MG SL tablet Commonly known as: NITROSTAT Place 1 tablet (0.4 mg total) under the tongue every 5 (five) minutes as needed for chest pain.   ondansetron 4 MG tablet Commonly known as: ZOFRAN Take 4 mg by mouth every 8 (eight) hours as needed for nausea or vomiting.   Oxycodone HCl 10 MG Tabs Take 1 tablet (10 mg total) by mouth every 6 (six) hours as needed. What changed:  reasons to take this Another medication with the same name was removed. Continue taking this medication, and follow the directions you see here.   PEN NEEDLES 31GX5/16" 31G X 8 MM Misc Use daily as directed   promethazine 12.5 MG tablet Commonly known as: PHENERGAN Take 1 tablet (12.5 mg total) by mouth every 8 (eight) hours as needed for nausea or vomiting. What changed: Another medication with the same name was removed. Continue taking this medication, and follow the directions you see here.   topiramate 50 MG  tablet Commonly known as: TOPAMAX Take 1 tablet (50 mg total) by mouth 2 (two) times daily. What changed:  when to take this reasons to take this   VITAMIN D PO Take 5,000 Units by mouth daily.       Allergies  Allergen Reactions   Lisinopril Swelling    Angioedema   Oxycodone-Acetaminophen Nausea Only   Latex Itching, Swelling and Rash   Morphine Itching and Rash      Procedures/Studies: CT ABDOMEN PELVIS W CONTRAST  Result Date: 07/30/2022 CLINICAL DATA:  Right lower quadrant pain EXAM: CT ABDOMEN AND PELVIS WITH CONTRAST TECHNIQUE: Multidetector CT imaging of the abdomen and pelvis was performed using the standard protocol following bolus administration of intravenous contrast. RADIATION DOSE REDUCTION: This exam was performed according to the departmental dose-optimization program which includes automated exposure control, adjustment of the mA and/or kV according to patient size and/or use of iterative reconstruction technique. CONTRAST:  OMNIPAQUE IOHEXOL 300 MG/ML  SOLN COMPARISON:  CT abdomen and pelvis 07/28/2022 FINDINGS: Lower chest: No acute abnormality. Hepatobiliary: There is fatty infiltration of the liver. The gallbladder is surgically absent. Pancreas: Unremarkable. No pancreatic ductal dilatation or surrounding inflammatory changes. Spleen: Normal in size without focal abnormality. Adrenals/Urinary Tract: Adrenal glands are unremarkable. Kidneys are normal, without renal calculi, focal lesion, or hydronephrosis. Bladder is unremarkable. Stomach/Bowel: Stomach is within normal limits. Appendix appears normal. No evidence of bowel wall thickening, distention, or inflammatory changes. Vascular/Lymphatic: There are atherosclerotic calcifications of the aorta. No enlarged abdominal or pelvic lymph nodes. There surgical clips along the pelvic sidewalls. Reproductive: Status post hysterectomy. No adnexal masses. Other: There is trace free fluid in the pelvis.  Musculoskeletal: No fracture is seen. Degenerative changes affect the spine. IMPRESSION: 1. Trace free fluid in the pelvis. 2. Normal appendix. 3. Fatty infiltration of the liver. Aortic Atherosclerosis (ICD10-I70.0). Electronically Signed   By: Darliss Cheney M.D.   On: 07/30/2022 18:20   CT ABDOMEN PELVIS W CONTRAST  Result Date: 07/28/2022 CLINICAL DATA:  57 year old female with right chest and abdominal pain, nausea and diarrhea. EXAM: CT ABDOMEN AND PELVIS WITH CONTRAST TECHNIQUE: Multidetector CT imaging of the abdomen and pelvis was performed using the standard protocol following bolus administration of intravenous contrast. RADIATION DOSE REDUCTION: This exam was performed according to the departmental dose-optimization program which includes  automated exposure control, adjustment of the mA and/or kV according to patient size and/or use of iterative reconstruction technique. CONTRAST:  OMNIPAQUE IOHEXOL 350 MG/ML SOLN COMPARISON:  CTA chest today.  CT Abdomen and Pelvis 01/17/2021. FINDINGS: Lower chest: Trace layering pleural effusions and dependent ground-glass opacity most resembling atelectasis, stable from CTA reported separately today. Hepatobiliary: Chronic cholecystectomy.  Negative liver. Pancreas: Negative. Spleen: Negative. Adrenals/Urinary Tract: Negative kidneys and adrenal glands. Symmetric renal enhancement and contrast excretion. Diminutive bladder. Numerous chronic pelvic phleboliths. Stomach/Bowel: Fluid-filled nondilated large bowel. Fluid to the level of the rectum and generalized mucosal thickening and hyperenhancement in the large bowel, most pronounced in the rectosigmoid colon (series 2, image 75). The appendix appears to remain normal, contains gas on series 2, image 66. Terminal ileum and distal small bowel are decompressed, but questionable generalized small bowel mucosal hyperenhancement. Retained food in the stomach. Duodenum is decompressed. No free air. No free fluid  or confluent mesenteric inflammation identified in the abdomen. Vascular/Lymphatic: Moderate Aortoiliac calcified atherosclerosis. Major arterial structures remain patent. Normal caliber abdominal aorta. Portal venous system is patent. Numerous surgical clips along the bilateral iliac vasculature suggest previous pelvic node dissection. No lymphadenopathy identified. Reproductive: Absent uterus.  Diminutive or absent ovaries. Other: Trace free fluid in the pelvis. Numerous superimposed chronic pelvic phleboliths. Musculoskeletal: Lower thoracic spine, lower lumbar spine degeneration with vacuum disc and endplate changes. No acute or suspicious osseous lesion. IMPRESSION: 1. Evidence of generalized Acute Colitis Versus Enterocolitis. No significant bowel wall thickening but generalized mucosal hyperenhancement, especially in the large bowel which contains fluid to the rectum in keeping with Enteritis/Diarrhea. Trace free fluid in the pelvis is likely reactive. 2. No bowel obstruction or free air.  Appendix remains normal. 3. Aortic Atherosclerosis (ICD10-I70.0). Postoperative changes in the bilateral pelvis, perhaps previous no dissection. Electronically Signed   By: Odessa Fleming M.D.   On: 07/28/2022 05:46   CT Angio Chest PE W and/or Wo Contrast  Result Date: 07/28/2022 CLINICAL DATA:  57 year old female with right chest and abdominal pain, nausea and diarrhea. EXAM: CT ANGIOGRAPHY CHEST WITH CONTRAST TECHNIQUE: Multidetector CT imaging of the chest was performed using the standard protocol during bolus administration of intravenous contrast. Multiplanar CT image reconstructions and MIPs were obtained to evaluate the vascular anatomy. RADIATION DOSE REDUCTION: This exam was performed according to the departmental dose-optimization program which includes automated exposure control, adjustment of the mA and/or kV according to patient size and/or use of iterative reconstruction technique. CONTRAST:  OMNIPAQUE  IOHEXOL 350 MG/ML SOLN COMPARISON:  Portable chest 0417 hours today. CT Abdomen and Pelvis reported separately. Prior chest CT 07/20/2009. FINDINGS: Cardiovascular: Excellent contrast bolus timing in the pulmonary arterial tree. No pulmonary artery filling defect identified. Heart size remains normal. No pericardial effusion. Normal thoracic aorta. Mediastinum/Nodes: Negative. No mediastinal mass or lymphadenopathy. Lungs/Pleura: Major airways are patent. Mildly lower lung volumes compared to 2011. Dependent pulmonary ground-glass opacity superimposed on trace pleural effusions most resembles atelectasis. No consolidation. And otherwise the lungs are clear. Upper Abdomen: Reported separately. Musculoskeletal: Advanced lower thoracic disc and endplate degeneration, multilevel mild vacuum disc. No acute osseous abnormality identified. Review of the MIP images confirms the above findings. IMPRESSION: 1. Negative for pulmonary embolus. 2. Trace layering pleural effusions with mild atelectasis. But otherwise negative CTA appearance of the Chest. 3. CT Abdomen and Pelvis reported separately. Electronically Signed   By: Odessa Fleming M.D.   On: 07/28/2022 05:31   DG Chest 1 View  Result Date:  07/28/2022 CLINICAL DATA:  57 year old female with right chest and abdominal pain, nausea and diarrhea. EXAM: CHEST  1 VIEW COMPARISON:  Chest radiographs 03/31/2022 and earlier. FINDINGS: Portable AP semi upright view at 0417 hours. Lower lung volumes. Mediastinal contours remain normal. Visualized tracheal air column is within normal limits. Allowing for portable technique the lungs are clear. No pneumothorax or pleural effusion. Negative visible bowel gas, osseous structures. IMPRESSION: Negative portable chest. Electronically Signed   By: Odessa Fleming M.D.   On: 07/28/2022 04:45      Subjective: Asking if she can go home today.  Reports feeling much better.  Tolerating diet without nausea, vomiting or pain.  No diarrhea.  Denies  dysuria, suprapubic or flank pain.  Discharge Exam:  Vitals:   08/05/22 1124 08/05/22 2026 08/06/22 0448 08/06/22 0820  BP: 139/77 121/83 (!) 152/91   Pulse:  90 (!) 102   Resp: 18 18 18    Temp: 98 F (36.7 C) 98.5 F (36.9 C) 100 F (37.8 C)   TempSrc: Oral Oral Oral   SpO2: 94% 92% 94% 95%  Weight:      Height:        General: Young female, moderately built and nourished sitting up comfortably in bed without distress. Cardiovascular: S1 & S2 heard, RRR, S1/S2 +. No murmurs, rubs, gallops or clicks. No JVD or pedal edema. Respiratory: Clear to auscultation without wheezing, rhonchi or crackles. No increased work of breathing. Abdominal:  Non distended, non tender & soft. No organomegaly or masses appreciated. Normal bowel sounds heard. CNS: Alert and oriented. No focal deficits. Extremities: no edema, no cyanosis    The results of significant diagnostics from this hospitalization (including imaging, microbiology, ancillary and laboratory) are listed below for reference.     Microbiology: Recent Results (from the past 240 hour(s))  Blood culture (routine x 2)     Status: None   Collection Time: 07/30/22  4:46 PM   Specimen: BLOOD  Result Value Ref Range Status   Specimen Description   Final    BLOOD LEFT ANTECUBITAL Performed at [facility], [address] [place], [place] [address]    Special Requests   Final    BOTTLES DRAWN AEROBIC AND ANAEROBIC Blood Culture results may not be optimal due to an excessive volume of blood received in culture bottles Performed at [facility], [address], [place], [place] [address]    Culture   Final    NO GROWTH 5 DAYS Performed at [facility], [address] [place], [place] [address]    Report Status 08/04/2022 FINAL  Final  Blood culture (routine x 2)     Status: None   Collection Time: 07/30/22  4:59 PM   Specimen: BLOOD LEFT WRIST  Result Value Ref Range Status   Specimen Description    Final    BLOOD LEFT WRIST Performed at [facility], [address] [place], [place] [address]    Special Requests   Final    BOTTLES DRAWN AEROBIC AND ANAEROBIC Blood Culture results may not be optimal due to an excessive volume of blood received in culture bottles Performed at [facility], [address] [place], [place] [address]    Culture   Final    NO GROWTH 5 DAYS Performed at [facility], [address], [place], [place] [address]    Report Status 08/04/2022 FINAL  Final  Urine Culture  Status: None   Collection Time: 07/30/22  7:47 PM   Specimen: Urine, Random  Result Value Ref Range Status   Specimen Description   Final    URINE, RANDOM Performed at [facility], [address], [place], [place] [address]    Special Requests   Final    NONE Reflexed from [id number] Performed at [facility], [address] [place], [place] [address]    Culture   Final    NO GROWTH Performed at [facility], [address], [place], [place] [address]    Report Status 07/31/2022 FINAL  Final  Urine Culture     Status: Abnormal   Collection Time: 08/02/22  6:13 AM   Specimen: Urine, Clean Catch  Result Value Ref Range Status   Specimen Description   Final    URINE, CLEAN CATCH Performed at [facility], [address] [place], [place] [address]    Special Requests   Final    NONE Performed at [facility], [address] [place], [place] [address]    Culture >=100,000 COLONIES/mL ESCHERICHIA COLI (A)  Final   Report Status 08/04/2022 FINAL  Final   Organism ID, Bacteria ESCHERICHIA COLI (A)  Final      Susceptibility   Escherichia coli - MIC*    AMPICILLIN >=32 RESISTANT Resistant     CEFAZOLIN >=64 RESISTANT Resistant     CEFEPIME <=0.12 SENSITIVE Sensitive     CEFTRIAXONE <=0.25 SENSITIVE Sensitive     CIPROFLOXACIN <=0.25 SENSITIVE Sensitive     GENTAMICIN <=1 SENSITIVE  Sensitive     IMIPENEM <=0.25 SENSITIVE Sensitive     NITROFURANTOIN <=16 SENSITIVE Sensitive     TRIMETH/SULFA >=320 RESISTANT Resistant     AMPICILLIN/SULBACTAM >=32 RESISTANT Resistant     PIP/TAZO 64 INTERMEDIATE Intermediate     * >=100,000 COLONIES/mL ESCHERICHIA COLI     Labs: CBC: Recent Labs  Lab 08/02/22 0430 08/02/22 0740 08/03/22 0445 08/04/22 0917  WBC 12.4*  --  12.1* 12.4*  NEUTROABS 7.1  --   --   --   HGB 14.5 8.2* 14.4 14.0  HCT 42.9 24.0* 47.2* 44.9  MCV 84.8  --  94.4 90.2  PLT 335  --  228 333    Basic Metabolic Panel: Recent Labs  Lab 08/02/22 0430 08/02/22 0740 08/02/22 1904 08/03/22 0445 08/04/22 0917 08/06/22 0552  NA 137 156* 137 135 140 139  K 3.5 <2.0* 2.9* 3.7 3.9 3.5  CL 95*  --  102 102 107 106  CO2 24  --  25 21* 25 26  GLUCOSE 217*  --  165* 146* 90 158*  BUN 24*  --  17 13 8 9   CREATININE 1.42*  --  1.06* 1.07* 1.21* 1.29*  CALCIUM 9.1  --  8.1* 8.2* 8.8* 8.2*  MG  --   --  1.6*  --   --   --     CBG: Recent Labs  Lab 08/05/22 1531 08/05/22 2022 08/05/22 2355 08/06/22 0400 08/06/22 0819  GLUCAP 239* 197* 173* 195* 175*     Urinalysis    Component Value Date/Time   COLORURINE YELLOW 08/02/2022 0613   APPEARANCEUR TURBID (A) 08/02/2022 0613   LABSPEC >=1.030 08/02/2022 0613   PHURINE 6.0 08/02/2022 0613   GLUCOSEU NEGATIVE 08/02/2022 0613   HGBUR MODERATE (A) 08/02/2022 0613   HGBUR negative 01/08/2010 1045   BILIRUBINUR NEGATIVE 08/02/2022 0613   BILIRUBINUR Negative  03/27/2022 1536   KETONESUR 80 (A) 08/02/2022 0613   PROTEINUR >=300 (A) 08/02/2022 0613   UROBILINOGEN 0.2 (A) 03/27/2022 1536   UROBILINOGEN 1.0 11/14/2013 0741   NITRITE NEGATIVE 08/02/2022 0613   LEUKOCYTESUR SMALL (A) 08/02/2022 5284      Time coordinating discharge: 25 minutes  SIGNED:  Marcellus Scott, MD,  FACP, FHM, [facility], [facility], [facility]   [facility] [place]     To contact the attending  provider between 7A-7P or the covering provider during after hours 7P-7A, please log into the web site [email or web address] and access using universal [place] password for that web site. If you do not have the password, please call the hospital operator.

## 2022-08-06 NOTE — Discharge Instructions (Signed)

## 2022-08-06 NOTE — Progress Notes (Signed)
Patient was provided with discharge education, patient verbalized understanding. IV's removed.  

## 2022-08-07 ENCOUNTER — Telehealth: Payer: Self-pay

## 2022-08-07 NOTE — Transitions of Care (Post Inpatient/ED Visit) (Signed)
08/07/2022  Name: [name] MRN: [id number] DOB: [birth year]  Today's TOC FU Call Status: Today's TOC FU Call Status:: Successful TOC FU Call Competed TOC FU Call Complete Date: 08/07/22  Transition Care Management Follow-up Telephone Call Date of Discharge: 08/06/22 Discharge Facility: [facility]) Type of Discharge: Inpatient Admission Primary Inpatient Discharge Diagnosis:: nausea vomiting How have you been since you were released from the hospital?: Better Any questions or concerns?: No  Items Reviewed: Did you receive and understand the discharge instructions provided?: Yes Medications obtained,verified, and reconciled?: Yes (Medications Reviewed) Any new allergies since your discharge?: No Dietary orders reviewed?: Yes Do you have support at home?: No  Medications Reviewed Today: Medications Reviewed Today     Reviewed by Karena Addison, LPN (Licensed Practical Nurse) on 08/07/22 at 1031  Med List Status: <None>   Medication Order Taking? Sig Documenting Provider Last Dose Status Informant  albuterol (VENTOLIN HFA) 108 (90 Base) MCG/ACT inhaler [id number] No Inhale 2 puffs into the lungs every 6 (six) hours as needed for wheezing or shortness of breath.  Patient taking differently: Inhale 2 puffs into the lungs 3 (three) times daily as needed for wheezing or shortness of breath.   Esperanza Richters, Cordelia Poche 07/19/2022 Active Self  amLODipine (NORVASC) 5 MG tablet [id number] No Take 1 tablet (5 mg total) by mouth daily. Saguier, Kateri Mc Past Week Active Self  aspirin 81 MG tablet [id number] No Take 81 mg by mouth daily. [provider] 07/19/2022 Active Self  atorvastatin (LIPITOR) 80 MG tablet [id number] No Take 1 tablet (80 mg total) by mouth daily. Esperanza Richters, Cordelia Poche 07/19/2022 Active Self  busPIRone (BUSPAR) 15 MG tablet [id number] No Take 1 tablet (15 mg total) by mouth 2 (two) times daily.  Patient taking differently: Take 15 mg by mouth daily as needed  (anxiety).   Saguier, Ramon Dredge, PA-C Past Month Active Self  carvedilol (COREG) 25 MG tablet [id number] No Take 1 tablet (25 mg total) by mouth 2 (two) times daily with a meal. Saguier, Ramon Dredge, PA-C 07/19/2022 8 pm Active Self  dicyclomine (BENTYL) 20 MG tablet [id number] No Take 1 tablet (20 mg total) by mouth 2 (two) times daily. Carmel Sacramento A, PA-C 07/28/2022 Active Self  fluticasone (FLONASE) 50 MCG/ACT nasal spray [id number] No Place 2 sprays into both nostrils daily. Saguier, Ramon Dredge, PA-C Past Month Active Self  fluticasone furoate-vilanterol (BREO ELLIPTA) 100-25 MCG/INH AEPB [id number] No Inhale 1 puff into the lungs daily. Esperanza Richters, Cordelia Poche 07/19/2022 Active Self  gabapentin (NEURONTIN) 300 MG capsule [id number] No TAKE 2 CAPSULES BY MOUTH THREE TIMES DAILY Marisue Brooklyn 07/19/2022 Active Self  Insulin Glargine (BASAGLAR KWIKPEN) 100 UNIT/ML [id number] No INJECT 40 UNITS SUBCUTANEOUSLY AT BEDTIME  Patient taking differently: Inject 40 Units into the skin daily.   Esperanza Richters, PA-C 07/19/2022 Active Self  Insulin Pen Needle (PEN NEEDLES 31GX5/16") 31G X 8 MM MISC [id number] No Use daily as directed Saguier, Ramon Dredge, PA-C Taking Active Self  metFORMIN (GLUCOPHAGE-XR) 500 MG 24 hr tablet [id number] No Take 1 tablet (500 mg total) by mouth daily with breakfast. Esperanza Richters, PA-C 07/19/2022 Active Self  methocarbamol (ROBAXIN) 750 MG tablet [id number] No Take 1 tablet (750 mg total) by mouth every 8 (eight) hours as needed for muscle spasms. Saguier, Ramon Dredge, PA-C Past Week Active Self  nitroGLYCERIN (NITROSTAT) 0.4 MG SL tablet [id number] No Place 1 tablet (0.4 mg total) under the tongue every 5 (five) minutes as needed for chest pain. Saguier, Ramon Dredge, PA-C Unknown Active Self  ondansetron (ZOFRAN) 4 MG tablet [id number] No Take 4 mg by mouth every 8 (eight) hours as needed for nausea or vomiting. [provider] 07/31/2022 Active Self  Oxycodone HCl 10 MG TABS [id number] No Take 1 tablet  (10 mg total) by mouth every 6 (six) hours as needed.  Patient taking differently: Take 10 mg by mouth every 6 (six) hours as needed (pain).   Sharlene Dory, DO 07/31/2022 Active Self  promethazine (PHENERGAN) 12.5 MG tablet [id number] No Take 1 tablet (12.5 mg total) by mouth every 8 (eight) hours as needed for nausea or vomiting. Carmel Sacramento A, PA-C 07/31/2022 Active Self  topiramate (TOPAMAX) 50 MG tablet [id number] No Take 1 tablet (50 mg total) by mouth 2 (two) times daily.  Patient taking differently: Take 50 mg by mouth 2 (two) times daily as needed (headache).   Saguier, Kateri Mc Past Month Active Self  VITAMIN D PO [id number] No Take 5,000 Units by mouth daily. [provider] 07/19/2022 Active Self            Home Care and Equipment/Supplies: Were Home Health Services Ordered?: NA Any new equipment or medical supplies ordered?: NA  Functional Questionnaire: Do you need assistance with bathing/showering or dressing?: No Do you need assistance with meal preparation?: No Do you need assistance with eating?: No Do you have difficulty maintaining continence: No Do you need assistance with getting out of bed/getting out of a chair/moving?: No Do you have difficulty managing or taking your medications?: No  Follow up appointments reviewed: PCP Follow-up appointment confirmed?: Yes Date of PCP follow-up appointment?: 08/20/22 Follow-up Provider: [facility] Follow-up appointment confirmed?: NA Do you need transportation to your follow-up appointment?: No Do you understand care options if your condition(s) worsen?: Yes-patient verbalized understanding    SIGNATURE Karena Addison, LPN [facility] Nurse Health Advisor Direct Dial [phone]

## 2022-08-12 ENCOUNTER — Other Ambulatory Visit: Payer: Self-pay | Admitting: Medical

## 2022-08-20 ENCOUNTER — Telehealth: Payer: Self-pay | Admitting: Medical

## 2022-08-20 ENCOUNTER — Encounter: Payer: Self-pay | Admitting: Medical

## 2022-08-20 ENCOUNTER — Ambulatory Visit (INDEPENDENT_AMBULATORY_CARE_PROVIDER_SITE_OTHER): Payer: Commercial Managed Care - HMO | Admitting: Medical

## 2022-08-20 VITALS — BP 115/88 | HR 91 | Temp 98.0°F | Resp 18 | Ht 72.0 in | Wt 185.4 lb

## 2022-08-20 DIAGNOSIS — Z794 Long term (current) use of insulin: Secondary | ICD-10-CM

## 2022-08-20 DIAGNOSIS — E1165 Type 2 diabetes mellitus with hyperglycemia: Secondary | ICD-10-CM | POA: Diagnosis not present

## 2022-08-20 DIAGNOSIS — Z124 Encounter for screening for malignant neoplasm of cervix: Secondary | ICD-10-CM

## 2022-08-20 DIAGNOSIS — M5440 Lumbago with sciatica, unspecified side: Secondary | ICD-10-CM

## 2022-08-20 DIAGNOSIS — N10 Acute tubulo-interstitial nephritis: Secondary | ICD-10-CM | POA: Diagnosis not present

## 2022-08-20 DIAGNOSIS — R112 Nausea with vomiting, unspecified: Secondary | ICD-10-CM

## 2022-08-20 DIAGNOSIS — Z1231 Encounter for screening mammogram for malignant neoplasm of breast: Secondary | ICD-10-CM

## 2022-08-20 DIAGNOSIS — Z87440 Personal history of urinary (tract) infections: Secondary | ICD-10-CM

## 2022-08-20 DIAGNOSIS — K3184 Gastroparesis: Secondary | ICD-10-CM

## 2022-08-20 DIAGNOSIS — G8929 Other chronic pain: Secondary | ICD-10-CM

## 2022-08-20 DIAGNOSIS — I10 Essential (primary) hypertension: Secondary | ICD-10-CM

## 2022-08-20 DIAGNOSIS — Z79899 Other long term (current) drug therapy: Secondary | ICD-10-CM

## 2022-08-20 DIAGNOSIS — Z1211 Encounter for screening for malignant neoplasm of colon: Secondary | ICD-10-CM

## 2022-08-20 DIAGNOSIS — M48061 Spinal stenosis, lumbar region without neurogenic claudication: Secondary | ICD-10-CM

## 2022-08-20 DIAGNOSIS — K529 Noninfective gastroenteritis and colitis, unspecified: Secondary | ICD-10-CM

## 2022-08-20 MED ORDER — OXYCODONE HCL 10 MG PO TABS
ORAL_TABLET | ORAL | 0 refills | Status: DC
Start: 2022-08-20 — End: 2022-08-20

## 2022-08-20 MED ORDER — OXYCODONE HCL 10 MG PO TABS
ORAL_TABLET | ORAL | 0 refills | Status: AC
Start: 2022-08-20 — End: ?

## 2022-08-20 NOTE — Patient Instructions (Addendum)
1. Type 2 diabetes mellitus with hyperglycemia, with long-term current use of insulin (HCC) -continue prior home dose of metformin, Basaglar home dose.  - CBC w/Diff - Comp Met (CMET) - Hemoglobin A1c  2.Hx of  pyelonephritis - CBC w/Diff  3. Hypertension, unspecified type Bp good.   Continue home dose of amlodipine 5 Mg daily, carvedilol 25 Mg twice daily.  4. Nausea and vomiting, unspecified vomiting type - Lipase  5. Screening for colon cancer Please call gi office directly as have referred in past and want to make sure you get seen. - Ambulatory referral to Gastroenterology  6. Gastroparesis - Ambulatory referral to Gastroenterology  7. History of UTI Will follow culture and make sure culture negative. - Urine Culture  8. Breast cancer screening by mammogram - MM 3D SCREENING MAMMOGRAM BILATERAL BREAST; Future  9. Cervical cancer screening - Ambulatory referral to Gynecology   Chronic back pain. Pt has been on contract in past for oxycodone in past. Uds done in hospital in June. Will update contract today. Send in refill today. 60 tab bid per month.   Follow up date to be determined after lab review.

## 2022-08-20 NOTE — Telephone Encounter (Signed)
Pt called to follow up on her Oxycodone, but looks like there might have been an issue with escribe. Please follow-up & then advise pt.

## 2022-08-20 NOTE — Progress Notes (Addendum)
Subjective:    Patient ID: [name], female    DOB: 1966/01/16, 57 y.o.   MRN: [id number]  HPI  Pt in for follow up from hospitalization on 08-02-2022.    " Discharge Diagnoses:  Principal Problem:   Acute pyelonephritis Active Problems:   Acute kidney injury superimposed on chronic kidney disease (HCC)   Uncontrolled type 2 diabetes mellitus with hyperglycemia, without long-term current use of insulin (HCC)   Dehydration   High anion gap metabolic acidosis   Intractable nausea and vomiting  Brief Summary: 57 year old woman with repeated ED visits for nausea, vomiting, abdominal pain. CT abdomen 6/19 negative. Recently on Ozempic but last dose 2 weeks ago. Unable to tolerate fluid challenge. Accepted for observation, IV fluids and antiemetics. Condition slowly improved.  Also treated for suspected pyelonephritis as cause for her presentation.  Improved with support of treatment and IV antibiotics.  Assessment and Plan: Intractable nausea, vomiting with now resolved diarrhea with associated right lower quadrant pain and right CVA tenderness. E coli right-sided pyelonephritis CT abdomen and pelvis on 6/17 had shown evidence of acute colitis versus enterocolitis and subsequent CT abdomen and pelvis on 6/19 was really unremarkable.  Although completed treatment for pyelonephritis, unclear if this was the primary cause of her initial presentation or due to acute GE. Urine culture from 6/22 showed E. coli that was sensitive to ceftriaxone.  Completed 5 days of IV ceftriaxone, completed treatment.  Do not see need for any further antibiotics at discharge. Improved on supportive care with bowel rest, multimodality pain control, antiemetics, IV fluids, gradual diet advancement.   Anion gap metabolic acidosis Anion gap elevation noted on admission, resolved spontaneously. Beta hydroxybutyrate was only mildly elevated.   Acute kidney injury  She may have underlying stage II chronic  kidney disease or even stage IIIa chronic kidney disease and this is not clear at this time. Creatinine was peaked on admit and at 1.56.  This has improved and fluctuated during the course of hospitalization.  Creatinine in the 1.2 range for the last 3 days. Advised adequate hydration, avoid nephrotoxics and close outpatient follow-up with repeat BMP.   Diabetes mellitus type 2 with diabetic peripheral neuropathy PMH diabetic gastroparesis Last hemoglobin A1c in February 9.5. At time of discharge, continue prior home dose of metformin, Basaglar home dose.  However continue to hold Ozempic which may also be the cause of her initial presentation.  This can be reassessed during close outpatient follow-up with PCP. Continue gabapentin   Hypertension: Controlled.  Continue home dose of amlodipine 5 Mg daily, carvedilol 25 Mg twice daily.   Hyperlipidemia Continue home dose of atorvastatin 80 Mg daily."  Pt states overall does feel better. No fever, no chills, no vomting or abd pain. Still some nausea. Pt has been seeing endocrinologist who put her ozempic.  No current uti type signs/symptoms.   Discussed prior attempts to refer to GI MD. Placed referral again.  On review 2-3 years since last mammogram. Normal in past.     Review of Systems  Constitutional:  Negative for chills, fatigue and fever.  Respiratory:  Negative for cough, chest tightness, shortness of breath and wheezing.   Cardiovascular:  Negative for chest pain and palpitations.  Gastrointestinal:  Positive for nausea. Negative for abdominal pain, blood in stool and diarrhea.  Genitourinary:  Negative for flank pain, hematuria, urgency, vaginal bleeding and vaginal pain.  Musculoskeletal:  Negative for back pain.  Neurological:  Negative for dizziness, numbness and headaches.  Hematological:  Negative for adenopathy.  Psychiatric/Behavioral:  Negative for behavioral problems and decreased concentration.     Past Medical  History:  Diagnosis Date   Anxiety 01/22/2016   Cardiac murmur 02/01/2019   Cardiomyopathy, secondary (HCC) 09/06/2009   Qualifier: Diagnosis of  By: Jolene Provost   Formatting of this note might be different from the original. Overview:  Qualifier: Diagnosis of  By: Jolene Provost   Chronic kidney disease, stage III (moderate) (HCC) 05/23/2014   Chronic pain syndrome 01/23/2016   Coronary artery disease    30% lesions noted   Depression    Diabetes type 2, uncontrolled 05/23/2014   Diabetic gastroparesis associated with type 1 diabetes mellitus (HCC) 11/27/2016   DIABETIC PERIPHERAL NEUROPATHY 09/21/2009   Qualifier: Diagnosis of  By: Delrae Alfred MD, [name]     Essential hypertension 08/30/2009   Qualifier: Diagnosis of  By: Cristela Felt, CNA, Christy     Gastroesophageal reflux disease 07/30/2009   Formatting of this note might be different from the original. Overview:  Overview:  Qualifier: Diagnosis of  By: Delrae Alfred MD, Alfonso Patten: Diagnosis of  By: Wilmon Pali NP, Consuela Mimes of this note might be different from the original. Overview:  Qualifier: Diagnosis of  By: Delrae Alfred MD, Alfonso Patten: Diagnosis of  By: Wilmon Pali NP, Gunnar Fusi   GERD (gastroesophageal reflux disease)    History of cervical cancer 08/17/2015   Status post partial hysterectomy  Formatting of this note might be different from the original. Status post partial hysterectomy   Hyperlipidemia    INSOMNIA 09/21/2009   Qualifier: Diagnosis of  By: Delrae Alfred MD, [name]     Lumbar facet arthropathy 05/14/2016   Lumbar spinal stenosis 02/11/2018   Metatarsalgia of both feet 03/11/2017   Migraine 09/13/2012   Overview:  IMPRESSION: possible abd migraine   TOBACCO ABUSE 09/21/2009   Qualifier: Diagnosis of  By: Delrae Alfred MD, Beverely Low of this note might be different from the original. 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  Tobacco Use   Smoking status: Former    Types: Cigarettes    Quit date: 1990    Years since quitting: 34.5   Smokeless tobacco: Never  Vaping Use   Vaping Use: Never used  Substance and Sexual Activity   Alcohol use: Yes    Comment: occasionally   Drug use: No   Sexual activity: Not on file  Other Topics Concern   Not on file  Social History Narrative   Not on file   Social Determinants of Health   Financial Resource Strain: Not on file  Food Insecurity: Food Insecurity Present (08/02/2022)   Hunger Vital Sign    Worried About Running Out of Food in the Last Year: Sometimes true    Ran Out of Food in the Last Year: Sometimes true  Transportation Needs: No Transportation Needs (  08/02/2022)   PRAPARE - Administrator, Civil Service (Medical): No    Lack of Transportation (Non-Medical): No  Physical Activity: Not on file  Stress: Not on file  Social Connections: Not on file  Intimate Partner Violence: Not At Risk (08/02/2022)   Humiliation, Afraid, Rape, and Kick questionnaire    Fear of Current or Ex-Partner: No    Emotionally Abused: No    Physically Abused: No    Sexually Abused: No    Past Surgical History:  Procedure Laterality Date   ABDOMINAL HYSTERECTOMY     partial   AMPUTATION Left 01/06/2022   Procedure: AMPUTATION 2nd TOE;  Surgeon: Nadara Mustard, MD;  Location: [facility] OR;  Service: Orthopedics;  Laterality: Left;   CHOLECYSTECTOMY     I & D EXTREMITY Left 04/24/2021    Procedure: LEFT LEG DEBRIDEMENT;  Surgeon: Nadara Mustard, MD;  Location: [facility] OR;  Service: Orthopedics;  Laterality: Left;   I & D EXTREMITY Left 05/01/2021   Procedure: LEFT KNEE DEBRIDEMENT;  Surgeon: Nadara Mustard, MD;  Location: [facility] OR;  Service: Orthopedics;  Laterality: Left;    Family History  Problem Relation Age of Onset   Diabetes Mother    Diabetes Father     Allergies  Allergen Reactions   Lisinopril Swelling    Angioedema   Oxycodone-Acetaminophen Nausea Only   Latex Itching, Swelling and Rash   Morphine Itching and Rash    Current Outpatient Medications on File Prior to Visit  Medication Sig Dispense Refill   albuterol (VENTOLIN HFA) 108 (90 Base) MCG/ACT inhaler Inhale 2 puffs into the lungs every 6 (six) hours as needed for wheezing or shortness of breath. (Patient taking differently: Inhale 2 puffs into the lungs 3 (three) times daily as needed for wheezing or shortness of breath.) 18 g 2   amLODipine (NORVASC) 5 MG tablet Take 1 tablet (5 mg total) by mouth daily. 90 tablet 3   aspirin 81 MG tablet Take 81 mg by mouth daily.     atorvastatin (LIPITOR) 80 MG tablet Take 1 tablet (80 mg total) by mouth daily. 90 tablet 3   busPIRone (BUSPAR) 15 MG tablet Take 1 tablet (15 mg total) by mouth 2 (two) times daily. (Patient taking differently: Take 15 mg by mouth daily as needed (anxiety).) 60 tablet 4   carvedilol (COREG) 25 MG tablet Take 1 tablet (25 mg total) by mouth 2 (two) times daily with a meal. 180 tablet 0   Continuous Glucose Sensor (FREESTYLE LIBRE 3 SENSOR) MISC USE AS DIRECTED TO MONITOR BLOOD SUGAR. CHANGE EVERY 14 DAYS     dicyclomine (BENTYL) 20 MG tablet Take 1 tablet (20 mg total) by mouth 2 (two) times daily. 20 tablet 0   fluticasone (FLONASE) 50 MCG/ACT nasal spray Place 2 sprays into both nostrils daily. 16 g 1   fluticasone furoate-vilanterol (BREO ELLIPTA) 100-25 MCG/INH AEPB Inhale 1 puff into the lungs daily. 100 each 2   gabapentin (NEURONTIN)  300 MG capsule TAKE 2 CAPSULES BY MOUTH THREE TIMES DAILY 270 capsule 0   Insulin Glargine (BASAGLAR KWIKPEN) 100 UNIT/ML INJECT 40 UNITS SUBCUTANEOUSLY AT BEDTIME 12 mL 0   Insulin Pen Needle (PEN NEEDLES 31GX5/16") 31G X 8 MM MISC Use daily as directed 100 each 0   metFORMIN (GLUCOPHAGE-XR) 500 MG 24 hr tablet Take 1 tablet (500 mg total) by mouth daily with breakfast. 60 tablet 2   methocarbamol (ROBAXIN) 750 MG tablet Take 1 tablet (750 mg  total) by mouth every 8 (eight) hours as needed for muscle spasms. 30 tablet 0   nitroGLYCERIN (NITROSTAT) 0.4 MG SL tablet Place 1 tablet (0.4 mg total) under the tongue every 5 (five) minutes as needed for chest pain. 30 tablet 3   ondansetron (ZOFRAN) 4 MG tablet Take 4 mg by mouth every 8 (eight) hours as needed for nausea or vomiting.     Oxycodone HCl 10 MG TABS Take 1 tablet (10 mg total) by mouth every 6 (six) hours as needed. (Patient taking differently: Take 10 mg by mouth every 6 (six) hours as needed (pain).) 16 tablet 0   promethazine (PHENERGAN) 12.5 MG tablet TAKE 1 TABLET BY MOUTH EVERY 8 HOURS AS NEEDED FOR NAUSEA FOR VOMITING 30 tablet 0   topiramate (TOPAMAX) 50 MG tablet Take 1 tablet (50 mg total) by mouth 2 (two) times daily. (Patient taking differently: Take 50 mg by mouth 2 (two) times daily as needed (headache).) 60 tablet 12   VITAMIN D PO Take 5,000 Units by mouth daily.     No current facility-administered medications on file prior to visit.    BP 115/88 (BP Location: Right Arm, Patient Position: Sitting, Cuff Size: Normal)   Pulse 91   Temp 98 F (36.7 C) (Temporal)   Resp 18   Ht 6' (1.829 m)   Wt 185 lb 6.4 oz (84.1 kg)   SpO2 98%   BMI 25.14 kg/m        Objective:   Physical Exam  General Mental Status- Alert. General Appearance- Not in acute distress.   Skin General: Color- Normal Color. Moisture- Normal Moisture.  Neck Carotid Arteries- Normal color. Moisture- Normal Moisture. No carotid bruits. No  JVD.  Chest and Lung Exam Auscultation: Breath Sounds:-Normal.  Cardiovascular Auscultation:Rythm- Regular. Murmurs & Other Heart Sounds:Auscultation of the heart reveals- No Murmurs.  Abdomen Inspection:-Inspeection Normal. Palpation/Percussion:Note:No mass. Palpation and Percussion of the abdomen reveal- Non Tender, Non Distended + BS, no rebound or guarding.   Neurologic Cranial Nerve exam:- CN III-XII intact(No nystagmus), symmetric smile. Strength:- 5/5 equal and symmetric strength both upper and lower extremities.       Assessment & Plan:   Patient Instructions  1. Type 2 diabetes mellitus with hyperglycemia, with long-term current use of insulin (HCC) -continue prior home dose of metformin, Basaglar home dose.  - CBC w/Diff - Comp Met (CMET) - Hemoglobin A1c  2.Hx of  pyelonephritis - CBC w/Diff  3. Hypertension, unspecified type Bp good.   Continue home dose of amlodipine 5 Mg daily, carvedilol 25 Mg twice daily.  4. Nausea and vomiting, unspecified vomiting type - Lipase  5. Screening for colon cancer Please call gi office directly as have referred in past and want to make sure you get seen. - Ambulatory referral to Gastroenterology  6. Gastroparesis - Ambulatory referral to Gastroenterology  7. History of UTI Will follow culture and make sure culture negative. - Urine Culture  8. Breast cancer screening by mammogram - MM 3D SCREENING MAMMOGRAM BILATERAL BREAST; Future  9. Cervical cancer screening - Ambulatory referral to Gynecology    Follow up date to be determined after lab review.     Esperanza Richters, PA-C    Time spent with patient today was  44 minutes which consisted of chart review, discussing diagnosis, work up, treatment and documentation.

## 2022-08-20 NOTE — Addendum Note (Signed)
Addended by: Gwenevere Abbot on: 08/20/2022 01:55 PM   Modules accepted: Orders

## 2022-08-20 NOTE — Addendum Note (Signed)
Addended by: Gwenevere Abbot on: 08/20/2022 04:54 PM   Modules accepted: Orders

## 2022-08-20 NOTE — Addendum Note (Signed)
Addended by: Mertha Finders on: 08/20/2022 02:23 PM   Modules accepted: Orders

## 2022-08-21 ENCOUNTER — Other Ambulatory Visit: Payer: Commercial Managed Care - HMO

## 2022-08-25 ENCOUNTER — Telehealth: Payer: Self-pay | Admitting: Medical

## 2022-08-25 NOTE — Telephone Encounter (Signed)
Pt states she needs a letter from pcp stating she is unemployed due to health problems so she can get assistance with "food and stuff". Please advise.

## 2022-08-26 NOTE — Telephone Encounter (Signed)
Pt stated she has been unemployed since her hospital visit on 07/29/2022 , she stated it doesn't matter what dx you use in the letter pt is just trying to get a disability claim until she finds work.

## 2022-08-27 ENCOUNTER — Other Ambulatory Visit (INDEPENDENT_AMBULATORY_CARE_PROVIDER_SITE_OTHER): Payer: Commercial Managed Care - HMO

## 2022-08-27 DIAGNOSIS — E1165 Type 2 diabetes mellitus with hyperglycemia: Secondary | ICD-10-CM

## 2022-08-27 DIAGNOSIS — R112 Nausea with vomiting, unspecified: Secondary | ICD-10-CM

## 2022-08-27 DIAGNOSIS — I10 Essential (primary) hypertension: Secondary | ICD-10-CM | POA: Diagnosis not present

## 2022-08-27 DIAGNOSIS — N10 Acute tubulo-interstitial nephritis: Secondary | ICD-10-CM

## 2022-08-27 DIAGNOSIS — Z794 Long term (current) use of insulin: Secondary | ICD-10-CM

## 2022-08-27 DIAGNOSIS — Z87440 Personal history of urinary (tract) infections: Secondary | ICD-10-CM

## 2022-08-27 LAB — CBC WITH DIFFERENTIAL/PLATELET
Basophils Absolute: 0.1 10*3/uL (ref 0.0–0.1)
Basophils Relative: 1.2 % (ref 0.0–3.0)
Eosinophils Absolute: 0.6 10*3/uL (ref 0.0–0.7)
Eosinophils Relative: 9.3 % — ABNORMAL HIGH (ref 0.0–5.0)
HCT: 38.9 % (ref 36.0–46.0)
Hemoglobin: 12.7 g/dL (ref 12.0–15.0)
Lymphocytes Relative: 39.7 % (ref 12.0–46.0)
Lymphs Abs: 2.4 10*3/uL (ref 0.7–4.0)
MCHC: 32.5 g/dL (ref 30.0–36.0)
MCV: 88.7 fl (ref 78.0–100.0)
Monocytes Absolute: 0.4 10*3/uL (ref 0.1–1.0)
Monocytes Relative: 7.2 % (ref 3.0–12.0)
Neutro Abs: 2.5 10*3/uL (ref 1.4–7.7)
Neutrophils Relative %: 42.6 % — ABNORMAL LOW (ref 43.0–77.0)
Platelets: 388 10*3/uL (ref 150.0–400.0)
RBC: 4.38 Mil/uL (ref 3.87–5.11)
RDW: 15.1 % (ref 11.5–15.5)
WBC: 6 10*3/uL (ref 4.0–10.5)

## 2022-08-27 LAB — COMPREHENSIVE METABOLIC PANEL
ALT: 13 U/L (ref 0–35)
AST: 16 U/L (ref 0–37)
Albumin: 4.1 g/dL (ref 3.5–5.2)
Alkaline Phosphatase: 110 U/L (ref 39–117)
BUN: 22 mg/dL (ref 6–23)
CO2: 27 mEq/L (ref 19–32)
Calcium: 9.8 mg/dL (ref 8.4–10.5)
Chloride: 101 mEq/L (ref 96–112)
Creatinine, Ser: 1.19 mg/dL (ref 0.40–1.20)
GFR: 50.7 mL/min — ABNORMAL LOW (ref >60.00)
Glucose, Bld: 140 mg/dL — ABNORMAL HIGH (ref 70–99)
Potassium: 4.1 mEq/L (ref 3.5–5.1)
Sodium: 140 mEq/L (ref 135–145)
Total Bilirubin: 0.5 mg/dL (ref 0.2–1.2)
Total Protein: 7.8 g/dL (ref 6.0–8.3)

## 2022-08-27 LAB — LIPASE: Lipase: 44 U/L (ref 11.0–59.0)

## 2022-08-27 LAB — HEMOGLOBIN A1C: Hgb A1c MFr Bld: 8.8 % — ABNORMAL HIGH (ref 4.6–6.5)

## 2022-08-28 ENCOUNTER — Encounter: Payer: Self-pay | Admitting: Medical

## 2022-09-01 ENCOUNTER — Inpatient Hospital Stay (HOSPITAL_BASED_OUTPATIENT_CLINIC_OR_DEPARTMENT_OTHER): Admission: RE | Admit: 2022-09-01 | Payer: Commercial Managed Care - HMO | Source: Ambulatory Visit

## 2022-09-16 NOTE — Telephone Encounter (Signed)
Erroneous encounter

## 2022-09-18 ENCOUNTER — Telehealth: Payer: Self-pay | Admitting: Medical

## 2022-09-18 MED ORDER — METHOCARBAMOL 750 MG PO TABS: 750.0000 mg | ORAL_TABLET | Freq: Three times a day (TID) | ORAL | 0 refills | Status: DC | PRN

## 2022-09-18 NOTE — Telephone Encounter (Signed)
Prescription Request  09/18/2022  Is this a "Controlled Substance" medicine? No  LOV: 08/20/2022  What is the name of the medication or equipment? methocarbamol (ROBAXIN) 750 MG tablet   Have you contacted your pharmacy to request a refill? No   Which pharmacy would you like this sent to?  [facility] [address] [place] - [address] [place] [address] Phone: [phone] Fax: [phone]    Patient notified that their request is being sent to the clinical staff for review and that they should receive a response within 2 business days.   Please advise at [facility] [phone]

## 2022-09-18 NOTE — Addendum Note (Signed)
Addended by: Maximino Sarin on: 09/18/2022 10:11 AM   Modules accepted: Orders

## 2022-09-18 NOTE — Telephone Encounter (Signed)
Rx sent 

## 2022-09-27 ENCOUNTER — Ambulatory Visit
Admission: EM | Admit: 2022-09-27 | Discharge: 2022-09-27 | Disposition: A | Discharge status: Home / Self Care | Payer: Commercial Managed Care - HMO | Attending: Internal Medicine | Admitting: Internal Medicine

## 2022-09-27 DIAGNOSIS — S29011A Strain of muscle and tendon of front wall of thorax, initial encounter: Secondary | ICD-10-CM

## 2022-09-27 DIAGNOSIS — R0789 Other chest pain: Secondary | ICD-10-CM

## 2022-09-27 MED ORDER — BACLOFEN 10 MG PO TABS: 10.0000 mg | ORAL_TABLET | Freq: Three times a day (TID) | ORAL | 0 refills | Status: DC

## 2022-09-27 NOTE — ED Provider Notes (Addendum)
[facility]    CSN: [id number] Arrival date & time: 09/27/22  6160      History   Chief Complaint Chief Complaint  Patient presents with   Chest Pain    HPI [name] is a 57 y.o. female.   Patient presents to urgent care for evaluation of chest pain to the left upper chest that started 2 days ago.  No recent trauma or injury to the chest wall.  Pain sometimes radiates from the left upper chest into the left armpit but does not go down the left arm. Turning motion and certain movements of the head makes pain worse. Chest pain is currently 8/10 and is described as sharp/shooting in nature. Rest and "not moving" makes pain better. When she's not moving, pain is much better but does not fully go away. No shortness of breath, nausea, vomiting, abdominal pain, fever/chills, cough, or dizziness. No recent syncope or heart palpitations. She works as a Careers information officer and is constantly having to user her arms above her head and perform pulling motions downward with heavy material. This makes pain worse as well. She went to work today but was sent here for checkup.  PMH pertinent for type 2 diabetes, CKD 3, HTN, and CAD. Last heart cath was 2011. Her dad passed way of heart attack at 54. Former smoker, denies current drug use. Ibuprofen helped a little bit with the pain temporarily.    Chest Pain   Past Medical History:  Diagnosis Date   Anxiety 01/22/2016   Cardiac murmur 02/01/2019   Cardiomyopathy, secondary (HCC) 09/06/2009   Qualifier: Diagnosis of  By: Jolene Provost   Formatting of this note might be different from the original. Overview:  Qualifier: Diagnosis of  By: Jolene Provost   Chronic kidney disease, stage III (moderate) (HCC) 05/23/2014   Chronic pain syndrome 01/23/2016   Coronary artery disease    30% lesions noted   Depression    Diabetes type 2, uncontrolled 05/23/2014   Diabetic gastroparesis  associated with type 1 diabetes mellitus (HCC) 11/27/2016   DIABETIC PERIPHERAL NEUROPATHY 09/21/2009   Qualifier: Diagnosis of  By: Delrae Alfred MD, [name]     Essential hypertension 08/30/2009   Qualifier: Diagnosis of  By: Cristela Felt, CNA, Christy     Gastroesophageal reflux disease 07/30/2009   Formatting of this note might be different from the original. Overview:  Overview:  Qualifier: Diagnosis of  By: Delrae Alfred MD, Alfonso Patten: Diagnosis of  By: Wilmon Pali NP, Consuela Mimes of this note might be different from the original. Overview:  Qualifier: Diagnosis of  By: Delrae Alfred MD, Alfonso Patten: Diagnosis of  By: Wilmon Pali NP, Gunnar Fusi   GERD (gastroesophageal reflux disease)    History of cervical cancer 08/17/2015   Status post partial hysterectomy  Formatting of this note might be different from the original. Status post partial hysterectomy   Hyperlipidemia    INSOMNIA 09/21/2009   Qualifier: Diagnosis of  By: Delrae Alfred MD, [name]     Lumbar facet arthropathy 05/14/2016   Lumbar spinal stenosis 02/11/2018   Metatarsalgia of both feet 03/11/2017   Migraine 09/13/2012   Overview:  IMPRESSION: possible abd migraine   TOBACCO ABUSE 09/21/2009   Qualifier: Diagnosis of  By: Delrae Alfred MD, Beverely Low of this note might be different from the original. Overview:  Qualifier: Diagnosis of  By: Delrae Alfred MD, [name]   Trigeminal neuralgia    ULCER-GASTRIC 09/28/2009  Qualifier: Diagnosis of  By: Wilmon Pali NP, Leeann Must, CANDIDAL 12/25/2009   Qualifier: Diagnosis of  By: Delrae Alfred MD, Hubbard Robinson D deficiency 04/25/2013    Patient Active Problem List   Diagnosis Date Noted   Intractable nausea and vomiting 08/03/2022   Dehydration 08/02/2022   High anion gap metabolic acidosis 08/02/2022   Acute pyelonephritis 08/02/2022   Osteomyelitis of second toe of left foot (HCC) 01/05/2022   Mood disorder (HCC) 01/05/2022   Sepsis (HCC) 05/21/2021    Chronic diastolic HF (heart failure) (HCC) 05/21/2021   Abscess of left lower leg    Necrotizing fasciitis (HCC)    Cellulitis of left leg 04/22/2021   Sepsis due to cellulitis (HCC) 04/22/2021   Severe sepsis (HCC) 04/22/2021   Acute kidney injury superimposed on chronic kidney disease (HCC) 04/22/2021   Chest pain, unspecified 04/22/2021   Uncontrolled type 2 diabetes mellitus with hyperglycemia, without long-term current use of insulin (HCC) 04/22/2021   Pain of left calf 04/19/2021   Viral upper respiratory tract infection 11/30/2020   Trigeminal neuralgia    Cervical cancer (HCC)    Chronic back pain    Coronary artery disease    GERD (gastroesophageal reflux disease)    Hyperlipidemia    Hypertension    Dyslipidemia 02/01/2019   Cardiac murmur 02/01/2019   Lumbar spinal stenosis 02/11/2018   Neuropathy 07/20/2017   Right flank pain    Nausea & vomiting 07/07/2017   Acute cystitis 07/07/2017   CAD (coronary artery disease) 07/07/2017   Metatarsalgia of both feet 03/11/2017   Diabetic gastroparesis associated with type 1 diabetes mellitus (HCC) 11/27/2016   Pure hypercholesterolemia 11/27/2016   Lumbar facet arthropathy 05/14/2016   Chronic pain syndrome 01/23/2016   Diabetic peripheral neuropathy (HCC) 01/23/2016   Anxiety 01/22/2016   History of cervical cancer 08/17/2015   Depression 08/10/2015   Overweight (BMI 25.0-29.9) 07/21/2014   Chronic kidney disease, stage III (moderate) (HCC) 05/23/2014   Diabetes type 2, uncontrolled 05/23/2014   Current use of insulin (HCC) 05/23/2014   Vitamin D deficiency 04/25/2013   Cerebrovascular disease 09/13/2012   Migraine 09/13/2012   Nonspecific abnormal findings on radiological and examination of skull and head 09/13/2012   Retention of urine 06/25/2011   ANKLE PAIN, LEFT 12/25/2009   DYSURIA 12/25/2009   VAGINITIS, CANDIDAL 12/25/2009   ULCER-GASTRIC 09/28/2009   DIABETIC PERIPHERAL NEUROPATHY 09/21/2009   TOBACCO ABUSE  09/21/2009   UTI (urinary tract infection) 09/21/2009   INSOMNIA 09/21/2009   TRANSAMINASES, SERUM, ELEVATED 09/21/2009   Elevation of level of transaminase or lactic acid dehydrogenase (LDH) 09/21/2009   Type 2 diabetes mellitus with stage 3 chronic kidney disease (HCC) 09/14/2009   Nausea with vomiting, unspecified 09/14/2009   ABDOMINAL PAIN-EPIGASTRIC 09/14/2009   Cardiomyopathy, secondary (HCC) 09/06/2009   Cardiomyopathy, unspecified (HCC) 09/06/2009   Essential hypertension 08/30/2009   DM2 (diabetes mellitus, type 2) (HCC) 07/30/2009   HLD (hyperlipidemia) 07/30/2009   GERD 07/30/2009   GASTROPARESIS 07/30/2009   CHEST PAIN, ATYPICAL 07/30/2009   POSITIVE PPD 07/30/2009   Type 2 diabetes mellitus with hyperglycemia (HCC) 07/30/2009   Gastric atony 07/30/2009   Gastroesophageal reflux disease 07/30/2009   Hyperlipidemia, unspecified 07/30/2009   Tuberculin test reaction 07/30/2009    Past Surgical History:  Procedure Laterality Date   ABDOMINAL HYSTERECTOMY     partial   AMPUTATION Left 01/06/2022   Procedure: AMPUTATION 2nd TOE;  Surgeon: Nadara Mustard, MD;  Location: [facility] OR;  Service: Orthopedics;  Laterality: Left;   CHOLECYSTECTOMY     I & D EXTREMITY Left 04/24/2021   Procedure: LEFT LEG DEBRIDEMENT;  Surgeon: Nadara Mustard, MD;  Location: [facility] OR;  Service: Orthopedics;  Laterality: Left;   I & D EXTREMITY Left 05/01/2021   Procedure: LEFT KNEE DEBRIDEMENT;  Surgeon: Nadara Mustard, MD;  Location: [facility] OR;  Service: Orthopedics;  Laterality: Left;    OB History   No obstetric history on file.      Home Medications    Prior to Admission medications   Medication Sig Start Date End Date Taking? Authorizing Provider  albuterol (VENTOLIN HFA) 108 (90 Base) MCG/ACT inhaler Inhale 2 puffs into the lungs every 6 (six) hours as needed for wheezing or shortness of breath. Patient taking differently: Inhale 2 puffs into the lungs 3 (three) times daily as needed for  wheezing or shortness of breath. 08/03/20  Yes Saguier, Ramon Dredge, PA-C  amLODipine (NORVASC) 5 MG tablet Take 1 tablet (5 mg total) by mouth daily. 01/13/22 01/08/23 Yes Saguier, Ramon Dredge, PA-C  aspirin 81 MG tablet Take 81 mg by mouth daily.   Yes [provider]  atorvastatin (LIPITOR) 80 MG tablet Take 1 tablet (80 mg total) by mouth daily. 01/13/22  Yes Saguier, Ramon Dredge, PA-C  baclofen (LIORESAL) 10 MG tablet Take 1 tablet (10 mg total) by mouth 3 (three) times daily. 09/27/22  Yes Carlisle Beers, FNP  carvedilol (COREG) 25 MG tablet Take 1 tablet (25 mg total) by mouth 2 (two) times daily with a meal. 06/18/22  Yes Saguier, Ramon Dredge, PA-C  fluticasone furoate-vilanterol (BREO ELLIPTA) 100-25 MCG/INH AEPB Inhale 1 puff into the lungs daily. 08/03/20  Yes Saguier, Ramon Dredge, PA-C  gabapentin (NEURONTIN) 300 MG capsule TAKE 2 CAPSULES BY MOUTH THREE TIMES DAILY 08/12/22  Yes Saguier, Ramon Dredge, PA-C  Insulin Glargine [facility]) 100 UNIT/ML INJECT 40 UNITS SUBCUTANEOUSLY AT BEDTIME 08/12/22  Yes Saguier, Ramon Dredge, PA-C  metFORMIN (GLUCOPHAGE-XR) 500 MG 24 hr tablet Take 1 tablet (500 mg total) by mouth daily with breakfast. 01/22/22  Yes Saguier, Ramon Dredge, PA-C  busPIRone (BUSPAR) 15 MG tablet Take 1 tablet (15 mg total) by mouth 2 (two) times daily. Patient taking differently: Take 15 mg by mouth daily as needed (anxiety). 01/13/22   Saguier, Ramon Dredge, PA-C  Continuous Glucose Sensor (FREESTYLE LIBRE 3 SENSOR) MISC USE AS DIRECTED TO MONITOR BLOOD SUGAR. CHANGE EVERY 14 DAYS 08/07/22   [provider]  dicyclomine (BENTYL) 20 MG tablet Take 1 tablet (20 mg total) by mouth 2 (two) times daily. 07/28/22   Carmel Sacramento A, PA-C  fluticasone (FLONASE) 50 MCG/ACT nasal spray Place 2 sprays into both nostrils daily. 01/13/22   Saguier, Ramon Dredge, PA-C  Insulin Pen Needle (PEN NEEDLES 31GX5/16") 31G X 8 MM MISC Use daily as directed 03/28/22   Saguier, Ramon Dredge, PA-C  methocarbamol (ROBAXIN) 750 MG tablet Take  1 tablet (750 mg total) by mouth every 8 (eight) hours as needed for muscle spasms. 09/18/22   Saguier, Ramon Dredge, PA-C  nitroGLYCERIN (NITROSTAT) 0.4 MG SL tablet Place 1 tablet (0.4 mg total) under the tongue every 5 (five) minutes as needed for chest pain. 03/21/21   Saguier, Ramon Dredge, PA-C  ondansetron (ZOFRAN) 4 MG tablet Take 4 mg by mouth every 8 (eight) hours as needed for nausea or vomiting.    [provider]  Oxycodone HCl 10 MG TABS 1 tab po bid prn severe pain. 08/20/22   Saguier, Ramon Dredge, PA-C  promethazine (PHENERGAN) 12.5 MG tablet  TAKE 1 TABLET BY MOUTH EVERY 8 HOURS AS NEEDED FOR NAUSEA FOR VOMITING 08/12/22   Saguier, Ramon Dredge, PA-C  topiramate (TOPAMAX) 50 MG tablet Take 1 tablet (50 mg total) by mouth 2 (two) times daily. Patient taking differently: Take 50 mg by mouth 2 (two) times daily as needed (headache). 01/13/22   Saguier, Ramon Dredge, PA-C  VITAMIN D PO Take 5,000 Units by mouth daily.    [provider]    Family History Family History  Problem Relation Age of Onset   Diabetes Mother    Diabetes Father     Social History Social History   Tobacco Use   Smoking status: Former    Current packs/day: 0.00    Types: Cigarettes    Quit date: 1990    Years since quitting: 34.6   Smokeless tobacco: Never  Vaping Use   Vaping status: Never Used  Substance Use Topics   Alcohol use: Yes    Comment: occasionally   Drug use: No     Allergies   Lisinopril, Oxycodone-acetaminophen, Latex, and Morphine   Review of Systems Review of Systems  Cardiovascular:  Positive for chest pain.  Per HPI   Physical Exam Triage Vital Signs ED Triage Vitals  Encounter Vitals Group     BP 09/27/22 0843 108/73     Systolic BP Percentile --      Diastolic BP Percentile --      Pulse Rate 09/27/22 0843 86     Resp 09/27/22 0843 18     Temp 09/27/22 0843 97.9 F (36.6 C)     Temp Source 09/27/22 0843 Oral     SpO2 09/27/22 0843 96 %     Weight 09/27/22 0840 190 lb  (86.2 kg)     Height 09/27/22 0840 6' (1.829 m)     Head Circumference --      Peak Flow --      Pain Score 09/27/22 0840 9     Pain Loc --      Pain Education --      Exclude from Growth Chart --    No data found.  Updated Vital Signs BP 108/73 (BP Location: Left Arm)   Pulse 86   Temp 97.9 F (36.6 C) (Oral)   Resp 18   Ht 6' (1.829 m)   Wt 190 lb (86.2 kg)   SpO2 96%   BMI 25.77 kg/m   Visual Acuity Right Eye Distance:   Left Eye Distance:   Bilateral Distance:    Right Eye Near:   Left Eye Near:    Bilateral Near:     Physical Exam Vitals and nursing note reviewed.  Constitutional:      Appearance: She is not ill-appearing or toxic-appearing.  HENT:     Head: Normocephalic and atraumatic.     Right Ear: Hearing and external ear normal.     Left Ear: Hearing and external ear normal.     Nose: Nose normal.     Mouth/Throat:     Lips: Pink.     Mouth: Mucous membranes are moist. No injury.     Tongue: No lesions. Tongue does not deviate from midline.     Palate: No mass and lesions.     Pharynx: Oropharynx is clear. Uvula midline. No pharyngeal swelling, oropharyngeal exudate, posterior oropharyngeal erythema or uvula swelling.     Tonsils: No tonsillar exudate or tonsillar abscesses.  Eyes:     General: Lids are normal. Vision grossly intact. Gaze aligned  appropriately.     Extraocular Movements: Extraocular movements intact.     Conjunctiva/sclera: Conjunctivae normal.  Cardiovascular:     Rate and Rhythm: Normal rate and regular rhythm.     Heart sounds: Normal heart sounds, S1 normal and S2 normal.  Pulmonary:     Effort: Pulmonary effort is normal. No respiratory distress.     Breath sounds: Normal breath sounds and air entry. No decreased breath sounds, wheezing, rhonchi or rales.  Chest:     Chest wall: Tenderness (Tenderness elicited to palpation of the left upper chest wall) present.    Musculoskeletal:     Cervical back: Neck supple.  Skin:     General: Skin is warm and dry.     Capillary Refill: Capillary refill takes less than 2 seconds.     Findings: No rash.  Neurological:     General: No focal deficit present.     Mental Status: She is alert and oriented to person, place, and time. Mental status is at baseline.     Cranial Nerves: No dysarthria or facial asymmetry.  Psychiatric:        Mood and Affect: Mood normal.        Speech: Speech normal.        Behavior: Behavior normal.        Thought Content: Thought content normal.        Judgment: Judgment normal.      UC Treatments / Results  Labs (all labs ordered are listed, but only abnormal results are displayed) Labs Reviewed - No data to display  EKG   Radiology No results found.  Procedures Procedures (including critical care time)  Medications Ordered in UC Medications - No data to display  Initial Impression / Assessment and Plan / UC Course  I have reviewed the triage vital signs and the nursing notes.  Pertinent labs & imaging results that were available during my care of the patient were reviewed by me and considered in my medical decision making (see chart for details).   1.  Musculoskeletal chest pain, muscle strain of chest wall Patient presents to urgent care for evaluation of chest pain.  Low suspicion for emergent cardiac etiology given reassuring workup today.  EKG shows normal sinus rhythm without ST/T wave changes, normal intervals.  Low suspicion for acute cardiopulmonary abnormality, therefore deferred imaging of the chest wall.  Low suspicion for pulmonary embolus, pneumothorax, ACS.   Chest discomfort is entirely reproducible on exam.  Question musculoskeletal etiology. However, given age, risk factors, would like for her to follow-up with PCP who may recommend referral to cardiology for further workup.    Counseled patient on potential for adverse effects with medications prescribed/recommended today, strict ER and return-to-clinic  precautions discussed, patient verbalized understanding.    Final Clinical Impressions(s) / UC Diagnoses   Final diagnoses:  Musculoskeletal chest pain  Muscle strain of chest wall, initial encounter     Discharge Instructions      Your pain is likely due to a muscle strain which will improve on its own with time.   - You may take over the counter medicines to help with pain.  - If you were given a shot of pain medicine in the clinic today, you may start taking ibuprofen/other NSAIDs in 12-24 hours. - You may also take the prescribed muscle relaxer as directed as needed for muscle aches/spasm.  Do not take this medication and drive or drink alcohol as it can make you sleepy.  Mainly use this medicine at nighttime as needed. - Apply heat 20 minutes on then 20 minutes off and perform gentle range of motion exercises to the area of greatest pain to prevent muscle stiffness and provide further pain relief.   Red flag symptoms to watch out for are numbness/tingling to the legs, weakness, loss of bowel/bladder control, and/or worsening pain that does not respond well to medicines. Follow-up with your primary care provider or return to urgent care if your symptoms do not improve in the next 3 to 4 days with medications and interventions recommended today. If your symptoms are severe (red flag), please go to the emergency room.        ED Prescriptions     Medication Sig Dispense Auth. Provider   baclofen (LIORESAL) 10 MG tablet Take 1 tablet (10 mg total) by mouth 3 (three) times daily. 30 each Carlisle Beers, FNP      PDMP not reviewed this encounter.   Carlisle Beers, FNP 09/27/22 0930    Carlisle Beers, FNP 09/27/22 [phone]

## 2022-09-27 NOTE — Discharge Instructions (Addendum)
Your pain is likely due to a muscle strain which will improve on its own with time.   - You may take over the counter medicines to help with pain.  - If you were given a shot of pain medicine in the clinic today, you may start taking ibuprofen/other NSAIDs in 12-24 hours. - You may also take the prescribed muscle relaxer as directed as needed for muscle aches/spasm.  Do not take this medication and drive or drink alcohol as it can make you sleepy.  Mainly use this medicine at nighttime as needed. - Apply heat 20 minutes on then 20 minutes off and perform gentle range of motion exercises to the area of greatest pain to prevent muscle stiffness and provide further pain relief.   Red flag symptoms to watch out for are numbness/tingling to the legs, weakness, loss of bowel/bladder control, and/or worsening pain that does not respond well to medicines. Follow-up with your primary care provider or return to urgent care if your symptoms do not improve in the next 3 to 4 days with medications and interventions recommended today. If your symptoms are severe (red flag), please go to the emergency room.   

## 2022-09-27 NOTE — ED Triage Notes (Signed)
"  Currently my chest wall extending to left side of chest is hurting". Started with "Sharp pain and continues". "I do a lot of lifting/moving stuff at work". No injury known. No sob. No nausea. No vomiting.

## 2022-10-02 NOTE — Patient Instructions (Addendum)
  Acute maxillary sinusitis, recurrence not specified with Left ear pain -augmentin antibiotic, benzonatate for cough and flonase for nasal congestion. -refill albuterol if needed for wheezing  Exposure to COVID-19 virus -video visit and  5 days past onset of illness. Covid testing not indicated   Lumbar back pain (chronic back pain)High risk medication use  -continue with oxycodone and robaxin.  Follow up 10-14 days or sooner if signs/symptoms persist or worsen.

## 2022-10-14 ENCOUNTER — Ambulatory Visit: Payer: Commercial Managed Care - HMO | Admitting: Radiology

## 2022-10-16 ENCOUNTER — Encounter: Payer: Self-pay | Admitting: Obstetrics and Gynecology

## 2022-10-16 ENCOUNTER — Ambulatory Visit (INDEPENDENT_AMBULATORY_CARE_PROVIDER_SITE_OTHER): Payer: Commercial Managed Care - HMO | Admitting: Obstetrics and Gynecology

## 2022-10-16 ENCOUNTER — Other Ambulatory Visit (HOSPITAL_COMMUNITY)
Admission: RE | Admit: 2022-10-16 | Discharge: 2022-10-16 | Disposition: A | Discharge status: Home / Self Care | Payer: Commercial Managed Care - HMO | Source: Ambulatory Visit | Attending: Obstetrics and Gynecology | Admitting: Obstetrics and Gynecology

## 2022-10-16 VITALS — BP 110/76 | HR 70 | Ht 70.25 in | Wt 180.0 lb

## 2022-10-16 DIAGNOSIS — C530 Malignant neoplasm of endocervix: Secondary | ICD-10-CM

## 2022-10-16 DIAGNOSIS — Z1211 Encounter for screening for malignant neoplasm of colon: Secondary | ICD-10-CM

## 2022-10-16 DIAGNOSIS — Z01419 Encounter for gynecological examination (general) (routine) without abnormal findings: Secondary | ICD-10-CM | POA: Diagnosis present

## 2022-10-16 DIAGNOSIS — Z1231 Encounter for screening mammogram for malignant neoplasm of breast: Secondary | ICD-10-CM | POA: Insufficient documentation

## 2022-10-16 NOTE — Progress Notes (Signed)
57 y.o. y.o. female here for annual exam. She denies any PM bleeding.  Had a radical hysterectomy in [facility] for cervical cancer in 2001. Last pap smear in 2012  HRTdenies Pelvic discharge: denies Pelvic pain: denies Denies any GU/GI complaints Recent CT scan of pelvis neg  Last mammogram: >1 yr Last colonoscopy: to be scheduled.  Has not done one.  Went for endoscopy and they wanted her to return due to too much food in her intestines.  Possible H.Pylori found at that time.   Blood pressure 110/76, pulse 70, height 5' 10.25" (1.784 m), weight 180 lb (81.6 kg).  No results found for: "DIAGPAP", "HPVHIGH", "ADEQPAP"  GYN HISTORY: see above No results found for: "DIAGPAP", "HPVHIGH", "ADEQPAP"  OB History  Gravida Para Term Preterm AB Living  4 4 4     4   SAB IAB Ectopic Multiple Live Births          4    # Outcome Date GA Lbr Len/2nd Weight Sex Type Anes PTL Lv  4 Term           3 Term           2 Term           1 Term             Past Medical History:  Diagnosis Date   Abnormal Pap smear of cervix    Anxiety 01/22/2016   Asthma    Cardiac murmur 02/01/2019   Cardiomyopathy, secondary (HCC) 09/06/2009   Qualifier: Diagnosis of  By: Jolene Provost   Formatting of this note might be different from the original. Overview:  Qualifier: Diagnosis of  By: Jolene Provost   Chronic kidney disease, stage III (moderate) (HCC) 05/23/2014   Chronic pain syndrome 01/23/2016   Coronary artery disease    30% lesions noted   Depression    Diabetes type 2, uncontrolled 05/23/2014   Diabetic gastroparesis associated with type 1 diabetes mellitus (HCC) 11/27/2016   DIABETIC PERIPHERAL NEUROPATHY 09/21/2009   Qualifier: Diagnosis of  By: Delrae Alfred MD, Fletcher Rathbun     Essential hypertension 08/30/2009   Qualifier: Diagnosis of  By: Cristela Felt, CNA, Christy     Gastroesophageal reflux disease 07/30/2009   Formatting of this note might be different from  the original. Overview:  Overview:  Qualifier: Diagnosis of  By: Delrae Alfred MD, Alfonso Patten: Diagnosis of  By: Wilmon Pali NP, Consuela Mimes of this note might be different from the original. Overview:  Qualifier: Diagnosis of  By: Delrae Alfred MD, Alfonso Patten: Diagnosis of  By: Wilmon Pali NP, Gunnar Fusi   GERD (gastroesophageal reflux disease)    History of cervical cancer 08/17/2015   Status post partial hysterectomy  Formatting of this note might be different from the original. Status post partial hysterectomy   Hyperlipidemia    INSOMNIA 09/21/2009   Qualifier: Diagnosis of  By: Delrae Alfred MD, Lanis Storlie     Lumbar facet arthropathy 05/14/2016   Lumbar spinal stenosis 02/11/2018   Metatarsalgia of both feet 03/11/2017   Migraine 09/13/2012   Overview:  IMPRESSION: possible abd migraine   TOBACCO ABUSE 09/21/2009   Qualifier: Diagnosis of  By: Delrae Alfred MD, Beverely Low of this note might be different from the original. Overview:  Qualifier: Diagnosis of  By: Delrae Alfred MD, Laquasha Groome   Trigeminal neuralgia    ULCER-GASTRIC 09/28/2009   Qualifier: Diagnosis of  By: Wilmon Pali NP, Gunnar Fusi  VAGINITIS, CANDIDAL 12/25/2009   Qualifier: Diagnosis of  By: Delrae Alfred MD, Hubbard Robinson D deficiency 04/25/2013    Past Surgical History:  Procedure Laterality Date   ABDOMINAL HYSTERECTOMY     partial   AMPUTATION Left 01/06/2022   Procedure: AMPUTATION 2nd TOE;  Surgeon: Nadara Mustard, MD;  Location: [facility] OR;  Service: Orthopedics;  Laterality: Left;   CHOLECYSTECTOMY     I & D EXTREMITY Left 04/24/2021   Procedure: LEFT LEG DEBRIDEMENT;  Surgeon: Nadara Mustard, MD;  Location: [facility] OR;  Service: Orthopedics;  Laterality: Left;   I & D EXTREMITY Left 05/01/2021   Procedure: LEFT KNEE DEBRIDEMENT;  Surgeon: Nadara Mustard, MD;  Location: [facility] OR;  Service: Orthopedics;  Laterality: Left;    Current Outpatient Medications on File Prior to Visit  Medication Sig Dispense Refill    albuterol (VENTOLIN HFA) 108 (90 Base) MCG/ACT inhaler Inhale 2 puffs into the lungs every 6 (six) hours as needed for wheezing or shortness of breath. (Patient taking differently: Inhale 2 puffs into the lungs 3 (three) times daily as needed for wheezing or shortness of breath.) 18 g 2   amLODipine (NORVASC) 5 MG tablet Take 1 tablet (5 mg total) by mouth daily. 90 tablet 3   aspirin 81 MG tablet Take 81 mg by mouth daily.     atorvastatin (LIPITOR) 80 MG tablet Take 1 tablet (80 mg total) by mouth daily. 90 tablet 3   baclofen (LIORESAL) 10 MG tablet Take 1 tablet (10 mg total) by mouth 3 (three) times daily. 30 each 0   busPIRone (BUSPAR) 15 MG tablet Take 1 tablet (15 mg total) by mouth 2 (two) times daily. (Patient taking differently: Take 15 mg by mouth daily as needed (anxiety).) 60 tablet 4   carvedilol (COREG) 25 MG tablet Take 1 tablet (25 mg total) by mouth 2 (two) times daily with a meal. 180 tablet 0   Continuous Glucose Sensor (FREESTYLE LIBRE 3 SENSOR) MISC USE AS DIRECTED TO MONITOR BLOOD SUGAR. CHANGE EVERY 14 DAYS     dicyclomine (BENTYL) 20 MG tablet Take 1 tablet (20 mg total) by mouth 2 (two) times daily. 20 tablet 0   fluticasone (FLONASE) 50 MCG/ACT nasal spray Place 2 sprays into both nostrils daily. 16 g 1   fluticasone furoate-vilanterol (BREO ELLIPTA) 100-25 MCG/INH AEPB Inhale 1 puff into the lungs daily. 100 each 2   gabapentin (NEURONTIN) 300 MG capsule TAKE 2 CAPSULES BY MOUTH THREE TIMES DAILY 270 capsule 0   Insulin Glargine (BASAGLAR KWIKPEN) 100 UNIT/ML INJECT 40 UNITS SUBCUTANEOUSLY AT BEDTIME 12 mL 0   Insulin Pen Needle (PEN NEEDLES 31GX5/16") 31G X 8 MM MISC Use daily as directed 100 each 0   metFORMIN (GLUCOPHAGE-XR) 500 MG 24 hr tablet Take 1 tablet (500 mg total) by mouth daily with breakfast. 60 tablet 2   methocarbamol (ROBAXIN) 750 MG tablet Take 1 tablet (750 mg total) by mouth every 8 (eight) hours as needed for muscle spasms. 30 tablet 0   nitroGLYCERIN  (NITROSTAT) 0.4 MG SL tablet Place 1 tablet (0.4 mg total) under the tongue every 5 (five) minutes as needed for chest pain. 30 tablet 3   ondansetron (ZOFRAN) 4 MG tablet Take 4 mg by mouth every 8 (eight) hours as needed for nausea or vomiting.     Oxycodone HCl 10 MG TABS 1 tab po bid prn severe pain. 60 tablet 0   OZEMPIC, 0.25 OR 0.5 MG/DOSE, 2  MG/3ML SOPN Inject into the skin.     promethazine (PHENERGAN) 12.5 MG tablet TAKE 1 TABLET BY MOUTH EVERY 8 HOURS AS NEEDED FOR NAUSEA FOR VOMITING 30 tablet 0   topiramate (TOPAMAX) 50 MG tablet Take 1 tablet (50 mg total) by mouth 2 (two) times daily. (Patient taking differently: Take 50 mg by mouth 2 (two) times daily as needed (headache).) 60 tablet 12   VITAMIN D PO Take 5,000 Units by mouth daily.     No current facility-administered medications on file prior to visit.    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  Tobacco Use   Smoking status: Former    Current packs/day: 0.00    Types: Cigarettes    Quit date: 1990    Years since quitting: 34.7   Smokeless tobacco: Never  Vaping Use   Vaping status: Never Used  Substance and Sexual Activity   Alcohol use: Yes    Comment: occasionally   Drug use: No   Sexual activity: Yes    Partners: Male    Birth control/protection: Surgical    Comment: hysterectomy  Other Topics Concern   Not on file  Social History Narrative   Not on file   Social Determinants of Health   Financial Resource Strain: Not on file  Food Insecurity: Food Insecurity Present (08/02/2022)   Hunger Vital Sign    Worried About Running Out of Food in the Last Year: Sometimes true    Ran Out of Food in the Last Year: Sometimes true  Transportation Needs: No Transportation Needs (08/02/2022)    PRAPARE - Administrator, Civil Service (Medical): No    Lack of Transportation (Non-Medical): No  Physical Activity: Not on file  Stress: Not on file  Social Connections: Unknown (06/23/2021)   Received from [facility]   Social Network    Social Network: Not on file  Intimate Partner Violence: Not At Risk (08/02/2022)   Humiliation, Afraid, Rape, and Kick questionnaire    Fear of Current or Ex-Partner: No    Emotionally Abused: No    Physically Abused: No    Sexually Abused: No    Family History  Problem Relation Age of Onset   Breast cancer Mother    Stroke Mother    Hypertension Mother    Diabetes Mother    Hypertension Father    Diabetes Father    Heart attack Father    Hypertension Sister    Diabetes Sister    Hypertension Brother    Diabetes Brother    Hypertension Brother    Diabetes Brother    Stroke Maternal Grandmother      Allergies  Allergen Reactions   Lisinopril Swelling    Angioedema   Oxycodone-Acetaminophen Nausea Only   Latex Itching, Swelling and Rash   Morphine Itching and Rash      Patient's last menstrual period was No LMP recorded. Patient has had a hysterectomy..          Denies any new sexual partners and declines sti testing.  Review of Systems Alls systems reviewed and are negative.     PE General appearance: alert, cooperative and appears stated age Head: Normocephalic, without obvious abnormality, atraumatic Neck: no adenopathy, supple,  symmetrical, trachea midline and thyroid normal to inspection and palpation Lungs: clear to auscultation bilaterally Breasts: normal appearance, no masses or tenderness Heart: regular rate and rhythm Abdomen: soft, non-tender; bowel sounds normal; no masses,  no organomegaly Extremities: extremities normal, atraumatic, no cyanosis or edema Skin: Skin color, texture, turgor normal. No rashes or lesions Lymph nodes: Cervical, supraclavicular, and axillary nodes normal. No abnormal  inguinal nodes palpated Neurologic: Grossly normal     Pelvic: External genitalia:  no lesions              Urethra:  normal appearing urethra with no masses, tenderness or lesions              Bartholins and Skenes: normal                 Vagina: normal appearing vagina with normal color and discharge, no lesions.               Cervix: removed.  No lesions at vaginal cuff               Bimanual Exam:  Uterus:  removed              Adnexa: removed: no mass, fullness, tenderness          Chaperone was present for exam.   A:         [facility] Woman GYN exam, history of cervical cancer with radical hysterectomy in 2001                             P:        Pap smear collected             Encouraged annual mammogram screening             Colonoscopy referral placed.  Counseled on importance              Labs and immunizations with her primary             Discussed breast self exams             Encouraged safe sexual practices and enouraged healthy lifestyle practices with diet and exercise  Earley Favor

## 2022-10-22 LAB — CYTOLOGY - PAP
Comment: NEGATIVE
Diagnosis: NEGATIVE
High risk HPV: NEGATIVE

## 2022-11-03 ENCOUNTER — Emergency Department (HOSPITAL_COMMUNITY): Payer: Commercial Managed Care - HMO

## 2022-11-03 ENCOUNTER — Inpatient Hospital Stay (HOSPITAL_COMMUNITY)
Admission: EM | Admit: 2022-11-03 | Discharge: 2022-11-08 | DRG: 073 | Disposition: A | Discharge status: Home / Self Care | Payer: Commercial Managed Care - HMO | Attending: Internal Medicine | Admitting: Internal Medicine

## 2022-11-03 ENCOUNTER — Encounter (HOSPITAL_COMMUNITY): Payer: Self-pay

## 2022-11-03 ENCOUNTER — Other Ambulatory Visit: Payer: Self-pay

## 2022-11-03 DIAGNOSIS — K279 Peptic ulcer, site unspecified, unspecified as acute or chronic, without hemorrhage or perforation: Secondary | ICD-10-CM | POA: Diagnosis present

## 2022-11-03 DIAGNOSIS — R0781 Pleurodynia: Secondary | ICD-10-CM | POA: Diagnosis present

## 2022-11-03 DIAGNOSIS — Z79899 Other long term (current) drug therapy: Secondary | ICD-10-CM

## 2022-11-03 DIAGNOSIS — K2971 Gastritis, unspecified, with bleeding: Secondary | ICD-10-CM | POA: Diagnosis present

## 2022-11-03 DIAGNOSIS — Z8249 Family history of ischemic heart disease and other diseases of the circulatory system: Secondary | ICD-10-CM

## 2022-11-03 DIAGNOSIS — Z794 Long term (current) use of insulin: Secondary | ICD-10-CM

## 2022-11-03 DIAGNOSIS — Z8711 Personal history of peptic ulcer disease: Secondary | ICD-10-CM

## 2022-11-03 DIAGNOSIS — Z7982 Long term (current) use of aspirin: Secondary | ICD-10-CM

## 2022-11-03 DIAGNOSIS — Z888 Allergy status to other drugs, medicaments and biological substances status: Secondary | ICD-10-CM

## 2022-11-03 DIAGNOSIS — Z885 Allergy status to narcotic agent status: Secondary | ICD-10-CM

## 2022-11-03 DIAGNOSIS — I129 Hypertensive chronic kidney disease with stage 1 through stage 4 chronic kidney disease, or unspecified chronic kidney disease: Secondary | ICD-10-CM | POA: Diagnosis present

## 2022-11-03 DIAGNOSIS — E1143 Type 2 diabetes mellitus with diabetic autonomic (poly)neuropathy: Principal | ICD-10-CM | POA: Diagnosis present

## 2022-11-03 DIAGNOSIS — Z91040 Latex allergy status: Secondary | ICD-10-CM

## 2022-11-03 DIAGNOSIS — F419 Anxiety disorder, unspecified: Secondary | ICD-10-CM | POA: Diagnosis present

## 2022-11-03 DIAGNOSIS — G894 Chronic pain syndrome: Secondary | ICD-10-CM | POA: Diagnosis present

## 2022-11-03 DIAGNOSIS — E860 Dehydration: Secondary | ICD-10-CM | POA: Diagnosis present

## 2022-11-03 DIAGNOSIS — Z803 Family history of malignant neoplasm of breast: Secondary | ICD-10-CM

## 2022-11-03 DIAGNOSIS — K2100 Gastro-esophageal reflux disease with esophagitis, without bleeding: Secondary | ICD-10-CM | POA: Diagnosis present

## 2022-11-03 DIAGNOSIS — Z87891 Personal history of nicotine dependence: Secondary | ICD-10-CM

## 2022-11-03 DIAGNOSIS — R109 Unspecified abdominal pain: Secondary | ICD-10-CM | POA: Diagnosis present

## 2022-11-03 DIAGNOSIS — Z90711 Acquired absence of uterus with remaining cervical stump: Secondary | ICD-10-CM

## 2022-11-03 DIAGNOSIS — E1142 Type 2 diabetes mellitus with diabetic polyneuropathy: Secondary | ICD-10-CM | POA: Diagnosis present

## 2022-11-03 DIAGNOSIS — J45909 Unspecified asthma, uncomplicated: Secondary | ICD-10-CM | POA: Diagnosis present

## 2022-11-03 DIAGNOSIS — Z1152 Encounter for screening for COVID-19: Secondary | ICD-10-CM

## 2022-11-03 DIAGNOSIS — K2289 Other specified disease of esophagus: Secondary | ICD-10-CM | POA: Diagnosis present

## 2022-11-03 DIAGNOSIS — R1084 Generalized abdominal pain: Secondary | ICD-10-CM | POA: Diagnosis not present

## 2022-11-03 DIAGNOSIS — Z7985 Long-term (current) use of injectable non-insulin antidiabetic drugs: Secondary | ICD-10-CM

## 2022-11-03 DIAGNOSIS — Z9049 Acquired absence of other specified parts of digestive tract: Secondary | ICD-10-CM

## 2022-11-03 DIAGNOSIS — Z8541 Personal history of malignant neoplasm of cervix uteri: Secondary | ICD-10-CM

## 2022-11-03 DIAGNOSIS — E872 Acidosis, unspecified: Secondary | ICD-10-CM | POA: Diagnosis present

## 2022-11-03 DIAGNOSIS — R9389 Abnormal findings on diagnostic imaging of other specified body structures: Secondary | ICD-10-CM

## 2022-11-03 DIAGNOSIS — R000 Tachycardia, unspecified: Secondary | ICD-10-CM | POA: Diagnosis present

## 2022-11-03 DIAGNOSIS — I429 Cardiomyopathy, unspecified: Secondary | ICD-10-CM | POA: Diagnosis present

## 2022-11-03 DIAGNOSIS — E876 Hypokalemia: Secondary | ICD-10-CM | POA: Diagnosis present

## 2022-11-03 DIAGNOSIS — Z89422 Acquired absence of other left toe(s): Secondary | ICD-10-CM

## 2022-11-03 DIAGNOSIS — K3184 Gastroparesis: Secondary | ICD-10-CM | POA: Diagnosis present

## 2022-11-03 DIAGNOSIS — N179 Acute kidney failure, unspecified: Secondary | ICD-10-CM | POA: Diagnosis present

## 2022-11-03 DIAGNOSIS — Z833 Family history of diabetes mellitus: Secondary | ICD-10-CM

## 2022-11-03 DIAGNOSIS — K920 Hematemesis: Secondary | ICD-10-CM

## 2022-11-03 DIAGNOSIS — E785 Hyperlipidemia, unspecified: Secondary | ICD-10-CM | POA: Diagnosis present

## 2022-11-03 DIAGNOSIS — R509 Fever, unspecified: Secondary | ICD-10-CM

## 2022-11-03 DIAGNOSIS — E1165 Type 2 diabetes mellitus with hyperglycemia: Secondary | ICD-10-CM | POA: Diagnosis present

## 2022-11-03 DIAGNOSIS — R6510 Systemic inflammatory response syndrome (SIRS) of non-infectious origin without acute organ dysfunction: Secondary | ICD-10-CM | POA: Diagnosis present

## 2022-11-03 DIAGNOSIS — I10 Essential (primary) hypertension: Secondary | ICD-10-CM | POA: Diagnosis present

## 2022-11-03 DIAGNOSIS — K529 Noninfective gastroenteritis and colitis, unspecified: Secondary | ICD-10-CM

## 2022-11-03 DIAGNOSIS — E1122 Type 2 diabetes mellitus with diabetic chronic kidney disease: Secondary | ICD-10-CM | POA: Diagnosis present

## 2022-11-03 DIAGNOSIS — Z823 Family history of stroke: Secondary | ICD-10-CM

## 2022-11-03 DIAGNOSIS — I2510 Atherosclerotic heart disease of native coronary artery without angina pectoris: Secondary | ICD-10-CM | POA: Diagnosis present

## 2022-11-03 DIAGNOSIS — N1831 Chronic kidney disease, stage 3a: Secondary | ICD-10-CM | POA: Diagnosis present

## 2022-11-03 DIAGNOSIS — Z7984 Long term (current) use of oral hypoglycemic drugs: Secondary | ICD-10-CM

## 2022-11-03 DIAGNOSIS — Z7951 Long term (current) use of inhaled steroids: Secondary | ICD-10-CM

## 2022-11-03 LAB — COMPREHENSIVE METABOLIC PANEL
ALT: 15 U/L (ref 0–44)
AST: 19 U/L (ref 15–41)
Albumin: 3.5 g/dL (ref 3.5–5.0)
Alkaline Phosphatase: 121 U/L (ref 38–126)
Anion gap: 18 — ABNORMAL HIGH (ref 5–15)
BUN: 33 mg/dL — ABNORMAL HIGH (ref 6–20)
CO2: 18 mmol/L — ABNORMAL LOW (ref 22–32)
Calcium: 9.5 mg/dL (ref 8.9–10.3)
Chloride: 101 mmol/L (ref 98–111)
Creatinine, Ser: 1.56 mg/dL — ABNORMAL HIGH (ref 0.44–1.00)
GFR, Estimated: 39 mL/min — ABNORMAL LOW (ref >60)
Glucose, Bld: 259 mg/dL — ABNORMAL HIGH (ref 70–99)
Potassium: 4.9 mmol/L (ref 3.5–5.1)
Sodium: 137 mmol/L (ref 135–145)
Total Bilirubin: 0.7 mg/dL (ref 0.3–1.2)
Total Protein: 8.7 g/dL — ABNORMAL HIGH (ref 6.5–8.1)

## 2022-11-03 LAB — CBC WITH DIFFERENTIAL/PLATELET
Abs Immature Granulocytes: 0.07 10*3/uL (ref 0.00–0.07)
Basophils Absolute: 0 10*3/uL (ref 0.0–0.1)
Basophils Relative: 0 %
Eosinophils Absolute: 0.2 10*3/uL (ref 0.0–0.5)
Eosinophils Relative: 2 %
HCT: 41.2 % (ref 36.0–46.0)
Hemoglobin: 13.7 g/dL (ref 12.0–15.0)
Immature Granulocytes: 1 %
Lymphocytes Relative: 20 %
Lymphs Abs: 2.8 10*3/uL (ref 0.7–4.0)
MCH: 28.1 pg (ref 26.0–34.0)
MCHC: 33.3 g/dL (ref 30.0–36.0)
MCV: 84.4 fL (ref 80.0–100.0)
Monocytes Absolute: 0.7 10*3/uL (ref 0.1–1.0)
Monocytes Relative: 5 %
Neutro Abs: 9.9 10*3/uL — ABNORMAL HIGH (ref 1.7–7.7)
Neutrophils Relative %: 72 %
Platelets: 320 10*3/uL (ref 150–400)
RBC: 4.88 MIL/uL (ref 3.87–5.11)
RDW: 14 % (ref 11.5–15.5)
WBC: 13.8 10*3/uL — ABNORMAL HIGH (ref 4.0–10.5)
nRBC: 0 % (ref 0.0–0.2)

## 2022-11-03 LAB — I-STAT CG4 LACTIC ACID, ED: Lactic Acid, Venous: 5.4 mmol/L (ref 0.5–1.9)

## 2022-11-03 LAB — RESP PANEL BY RT-PCR (RSV, FLU A&B, COVID)  RVPGX2
Influenza A by PCR: NEGATIVE
Influenza B by PCR: NEGATIVE
Resp Syncytial Virus by PCR: NEGATIVE
SARS Coronavirus 2 by RT PCR: NEGATIVE

## 2022-11-03 LAB — PROTIME-INR
INR: 1.1 (ref 0.8–1.2)
Prothrombin Time: 14.7 seconds (ref 11.4–15.2)

## 2022-11-03 LAB — APTT: aPTT: 25 seconds (ref 24–36)

## 2022-11-03 LAB — CBG MONITORING, ED: Glucose-Capillary: 233 mg/dL — ABNORMAL HIGH (ref 70–99)

## 2022-11-03 MED ORDER — LORAZEPAM 2 MG/ML IJ SOLN
0.5000 mg | Freq: Once | INTRAMUSCULAR | Status: AC
  Administered 2022-11-03: 0.5 mg via INTRAVENOUS
  Filled 2022-11-03: qty 1

## 2022-11-03 MED ORDER — FENTANYL CITRATE PF 50 MCG/ML IJ SOSY
50.0000 ug | PREFILLED_SYRINGE | Freq: Once | INTRAMUSCULAR | Status: AC
  Administered 2022-11-03: 50 ug via INTRAVENOUS
  Filled 2022-11-03: qty 1

## 2022-11-03 MED ORDER — ACETAMINOPHEN 650 MG RE SUPP
650.0000 mg | Freq: Once | RECTAL | Status: AC
  Administered 2022-11-03: 650 mg via RECTAL
  Filled 2022-11-03: qty 1

## 2022-11-03 MED ORDER — VANCOMYCIN HCL 1500 MG/300ML IV SOLN
1500.0000 mg | Freq: Once | INTRAVENOUS | Status: AC
  Administered 2022-11-03: 1500 mg via INTRAVENOUS
  Filled 2022-11-03: qty 300

## 2022-11-03 MED ORDER — PANTOPRAZOLE SODIUM 40 MG IV SOLR
40.0000 mg | Freq: Once | INTRAVENOUS | Status: AC
  Administered 2022-11-03: 40 mg via INTRAVENOUS
  Filled 2022-11-03: qty 10

## 2022-11-03 MED ORDER — LACTATED RINGERS IV BOLUS (SEPSIS)
500.0000 mL | Freq: Once | INTRAVENOUS | Status: AC
  Administered 2022-11-03: 500 mL via INTRAVENOUS

## 2022-11-03 MED ORDER — SODIUM CHLORIDE 0.9 % IV SOLN
2.0000 g | Freq: Once | INTRAVENOUS | Status: AC
  Administered 2022-11-03: 2 g via INTRAVENOUS
  Filled 2022-11-03: qty 20

## 2022-11-03 MED ORDER — LACTATED RINGERS IV BOLUS (SEPSIS)
1000.0000 mL | Freq: Once | INTRAVENOUS | Status: AC
  Administered 2022-11-03: 1000 mL via INTRAVENOUS

## 2022-11-03 MED ORDER — IOHEXOL 350 MG/ML SOLN
60.0000 mL | Freq: Once | INTRAVENOUS | Status: AC | PRN
  Administered 2022-11-03: 60 mL via INTRAVENOUS

## 2022-11-03 MED ORDER — METRONIDAZOLE 500 MG/100ML IV SOLN
500.0000 mg | Freq: Once | INTRAVENOUS | Status: AC
  Administered 2022-11-03: 500 mg via INTRAVENOUS

## 2022-11-03 MED ORDER — VANCOMYCIN HCL IN DEXTROSE 1-5 GM/200ML-% IV SOLN: 1000.0000 mg | Freq: Once | INTRAVENOUS | Status: DC

## 2022-11-03 NOTE — ED Provider Notes (Signed)
[name]   CSN: [id number] Arrival date & time: 11/03/22  1840     History  Chief Complaint  Patient presents with   Emesis   Abdominal Pain    [name] is a 57 y.o. female.   Emesis Associated symptoms: abdominal pain   Abdominal Pain Associated symptoms: vomiting   57 year old female with past medical history of cardiomyopathy, CKD, CAD, type 2 diabetes, hyperlipidemia presenting for evaluation of vomiting and abdominal pain.  Patient reports severe abdominal pain for the last several days that has markedly worsened today.  She describes severe pain and inability to keep down liquid or solid foods.  Patient also notes that her emesis was nearly dark black at times.  Denies any history of liver disease.  Had gallbladder surgery several years ago.  Reports fevers and chills.     Home Medications Prior to Admission medications   Medication Sig Start Date End Date Taking? Authorizing Provider  albuterol (VENTOLIN HFA) 108 (90 Base) MCG/ACT inhaler Inhale 2 puffs into the lungs every 6 (six) hours as needed for wheezing or shortness of breath. Patient taking differently: Inhale 2 puffs into the lungs 3 (three) times daily as needed for wheezing or shortness of breath. 08/03/20   Saguier, Ramon Dredge, PA-C  amLODipine (NORVASC) 5 MG tablet Take 1 tablet (5 mg total) by mouth daily. 01/13/22 01/08/23  Saguier, Ramon Dredge, PA-C  aspirin 81 MG tablet Take 81 mg by mouth daily.    [provider]  atorvastatin (LIPITOR) 80 MG tablet Take 1 tablet (80 mg total) by mouth daily. 01/13/22   Saguier, Ramon Dredge, PA-C  baclofen (LIORESAL) 10 MG tablet Take 1 tablet (10 mg total) by mouth 3 (three) times daily. 09/27/22   Carlisle Beers, FNP  busPIRone (BUSPAR) 15 MG tablet Take 1 tablet (15 mg total) by mouth 2 (two) times daily. Patient taking differently: Take 15 mg by mouth daily as needed (anxiety). 01/13/22   Saguier,  Ramon Dredge, PA-C  carvedilol (COREG) 25 MG tablet Take 1 tablet (25 mg total) by mouth 2 (two) times daily with a meal. 06/18/22   Saguier, Ramon Dredge, PA-C  Continuous Glucose Sensor (FREESTYLE LIBRE 3 SENSOR) MISC USE AS DIRECTED TO MONITOR BLOOD SUGAR. CHANGE EVERY 14 DAYS 08/07/22   [provider]  dicyclomine (BENTYL) 20 MG tablet Take 1 tablet (20 mg total) by mouth 2 (two) times daily. 07/28/22   Carmel Sacramento A, PA-C  fluticasone (FLONASE) 50 MCG/ACT nasal spray Place 2 sprays into both nostrils daily. 01/13/22   Saguier, Ramon Dredge, PA-C  fluticasone furoate-vilanterol (BREO ELLIPTA) 100-25 MCG/INH AEPB Inhale 1 puff into the lungs daily. 08/03/20   Saguier, Ramon Dredge, PA-C  gabapentin (NEURONTIN) 300 MG capsule TAKE 2 CAPSULES BY MOUTH THREE TIMES DAILY 08/12/22   Saguier, Ramon Dredge, PA-C  Insulin Glargine [facility]) 100 UNIT/ML INJECT 40 UNITS SUBCUTANEOUSLY AT BEDTIME 08/12/22   Saguier, Ramon Dredge, PA-C  Insulin Pen Needle (PEN NEEDLES 31GX5/16") 31G X 8 MM MISC Use daily as directed 03/28/22   Saguier, Ramon Dredge, PA-C  metFORMIN (GLUCOPHAGE-XR) 500 MG 24 hr tablet Take 1 tablet (500 mg total) by mouth daily with breakfast. 01/22/22   Saguier, Ramon Dredge, PA-C  methocarbamol (ROBAXIN) 750 MG tablet Take 1 tablet (750 mg total) by mouth every 8 (eight) hours as needed for muscle spasms. 09/18/22   Saguier, Ramon Dredge, PA-C  nitroGLYCERIN (NITROSTAT) 0.4 MG SL tablet Place 1 tablet (0.4 mg total) under the tongue every 5 (five)  minutes as needed for chest pain. 03/21/21   Saguier, Ramon Dredge, PA-C  ondansetron (ZOFRAN) 4 MG tablet Take 4 mg by mouth every 8 (eight) hours as needed for nausea or vomiting.    [provider]  Oxycodone HCl 10 MG TABS 1 tab po bid prn severe pain. 08/20/22   Saguier, Ramon Dredge, PA-C  OZEMPIC, 0.25 OR 0.5 MG/DOSE, 2 MG/3ML SOPN Inject into the skin. 10/14/22   [provider]  promethazine (PHENERGAN) 12.5 MG tablet TAKE 1 TABLET BY MOUTH EVERY 8 HOURS AS NEEDED FOR NAUSEA FOR  VOMITING 08/12/22   Saguier, Ramon Dredge, PA-C  topiramate (TOPAMAX) 50 MG tablet Take 1 tablet (50 mg total) by mouth 2 (two) times daily. Patient taking differently: Take 50 mg by mouth 2 (two) times daily as needed (headache). 01/13/22   Saguier, Ramon Dredge, PA-C  VITAMIN D PO Take 5,000 Units by mouth daily.    [provider]      Allergies    Lisinopril, Oxycodone-acetaminophen, Latex, and Morphine    Review of Systems   Review of Systems  Gastrointestinal:  Positive for abdominal pain and vomiting.    Physical Exam Updated Vital Signs BP 93/63   Pulse (!) 101   Temp 99.7 F (37.6 C) (Oral)   Resp 17   SpO2 (!) 88%  Physical Exam Constitutional:      General: She is in acute distress.     Appearance: She is ill-appearing.     Comments: Shaking, diaphoretic, appears in discomfort  HENT:     Head: Normocephalic and atraumatic.     Mouth/Throat:     Mouth: Mucous membranes are moist.  Eyes:     Extraocular Movements: Extraocular movements intact.  Cardiovascular:     Rate and Rhythm: Regular rhythm. Tachycardia present.     Heart sounds: Normal heart sounds.  Pulmonary:     Breath sounds: No wheezing.  Abdominal:     General: Abdomen is flat.     Palpations: Abdomen is soft.     Tenderness: There is generalized abdominal tenderness. There is no guarding or rebound.     Hernia: No hernia is present.  Skin:    General: Skin is warm.     Findings: No rash.  Neurological:     General: No focal deficit present.     Mental Status: She is alert.     Cranial Nerves: No cranial nerve deficit.     ED Results / Procedures / Treatments   Labs (all labs ordered are listed, but only abnormal results are displayed) Labs Reviewed  COMPREHENSIVE METABOLIC PANEL - Abnormal; Notable for the following components:      Result Value   CO2 18 (*)    Glucose, Bld 259 (*)    BUN 33 (*)    Creatinine, Ser 1.56 (*)    Total Protein 8.7 (*)    GFR, Estimated 39 (*)    Anion gap  18 (*)    All other components within normal limits  CBC WITH DIFFERENTIAL/PLATELET - Abnormal; Notable for the following components:   WBC 13.8 (*)    Neutro Abs 9.9 (*)    All other components within normal limits  CBG MONITORING, ED - Abnormal; Notable for the following components:   Glucose-Capillary 233 (*)    All other components within normal limits  I-STAT CG4 LACTIC ACID, ED - Abnormal; Notable for the following components:   Lactic Acid, Venous 5.4 (*)    All other components within normal limits  RESP PANEL BY RT-PCR (RSV, FLU A&B, COVID)  RVPGX2  CULTURE, BLOOD (SINGLE)  CULTURE, BLOOD (ROUTINE X 2)  PROTIME-INR  APTT  URINALYSIS, W/ REFLEX TO CULTURE (INFECTION SUSPECTED)  I-STAT CG4 LACTIC ACID, ED    EKG EKG Interpretation Date/Time:  Monday November 03 2022 18:55:54 EDT Ventricular Rate:  112 PR Interval:  146 QRS Duration:  82 QT Interval:  361 QTC Calculation: 493 R Axis:   73  Text Interpretation: Sinus tachycardia Probable anteroseptal infarct, old Confirmed by Ross Marcus ([id number]) on 11/03/2022 8:14:37 PM  Radiology CT ABDOMEN PELVIS W CONTRAST  Result Date: 11/03/2022 CLINICAL DATA:  Emesis abdomen pain EXAM: CT ABDOMEN AND PELVIS WITH CONTRAST TECHNIQUE: Multidetector CT imaging of the abdomen and pelvis was performed using the standard protocol following bolus administration of intravenous contrast. RADIATION DOSE REDUCTION: This exam was performed according to the departmental dose-optimization program which includes automated exposure control, adjustment of the mA and/or kV according to patient size and/or use of iterative reconstruction technique. CONTRAST:  60mL OMNIPAQUE IOHEXOL 350 MG/ML SOLN COMPARISON:  Radiograph 11/03/2022, CT 07/28/2022 FINDINGS: Lower chest: Lung bases demonstrate no acute airspace disease. Hepatobiliary: No focal liver abnormality is seen. Status post cholecystectomy. No biliary dilatation. Pancreas: Unremarkable. No  pancreatic ductal dilatation or surrounding inflammatory changes. Spleen: Normal in size without focal abnormality. Adrenals/Urinary Tract: Adrenal glands are unremarkable. Kidneys are normal, without renal calculi, focal lesion, or hydronephrosis. Bladder is unremarkable. Stomach/Bowel: Stomach nondistended. Possible mild pyloric wall thickening. No dilated small bowel. Negative appendix. Vascular/Lymphatic: Moderate aortic atherosclerosis. No aneurysm. No suspicious lymph nodes Reproductive: Hysterectomy. No adnexal mass small amount of air in the vagina. Chronic soft tissue fullness of the vagina. Other: Negative for pelvic effusion or free air Musculoskeletal: No acute or suspicious osseous abnormality. Multilevel degenerative changes IMPRESSION: 1. Possible mild pyloric wall thickening, correlate for peptic ulcer disease/gastritis. 2. Otherwise no CT evidence for acute intra-abdominal or pelvic abnormality. 3. Aortic atherosclerosis. Aortic Atherosclerosis (ICD10-I70.0). Electronically Signed   By: Jasmine Pang M.D.   On: 11/03/2022 22:02   DG Abdomen 1 View  Result Date: 11/03/2022 CLINICAL DATA:  Abdomen pain EXAM: ABDOMEN - 1 VIEW COMPARISON:  09/13/2017 FINDINGS: Surgical clips in the right upper quadrant. Nonspecific paucity of bowel gas. Numerous clips in the pelvis. No radiopaque calculi IMPRESSION: Nonspecific paucity of bowel gas. Electronically Signed   By: Jasmine Pang M.D.   On: 11/03/2022 21:56   DG Chest Port 1 View  Result Date: 11/03/2022 CLINICAL DATA:  Possible sepsis chest pain EXAM: PORTABLE CHEST 1 VIEW COMPARISON:  07/28/2022 FINDINGS: The heart size and mediastinal contours are within normal limits. Both lungs are clear. The visualized skeletal structures are unremarkable. IMPRESSION: No active disease. Electronically Signed   By: Jasmine Pang M.D.   On: 11/03/2022 21:55    Procedures Procedures    Medications Ordered in ED Medications  lactated ringers bolus 1,000 mL  (1,000 mLs Intravenous New Bag/Given 11/03/22 2119)    And  lactated ringers bolus 1,000 mL (1,000 mLs Intravenous New Bag/Given 11/03/22 2122)    And  lactated ringers bolus 500 mL (500 mLs Intravenous New Bag/Given 11/03/22 2340)  fentaNYL (SUBLIMAZE) injection 50 mcg (50 mcg Intravenous Given 11/03/22 1913)  acetaminophen (TYLENOL) suppository 650 mg (650 mg Rectal Given 11/03/22 1916)  cefTRIAXone (ROCEPHIN) 2 g in sodium chloride 0.9 % 100 mL IVPB (0 g Intravenous Stopped 11/03/22 2022)  metroNIDAZOLE (FLAGYL) IVPB 500 mg (0 mg Intravenous Stopped 11/03/22 2117)  pantoprazole (PROTONIX) injection  40 mg (40 mg Intravenous Given 11/03/22 1936)  LORazepam (ATIVAN) injection 0.5 mg (0.5 mg Intravenous Given 11/03/22 1935)  vancomycin (VANCOREADY) IVPB 1500 mg/300 mL (0 mg Intravenous Stopped 11/03/22 2342)  fentaNYL (SUBLIMAZE) injection 50 mcg (50 mcg Intravenous Given 11/03/22 1946)  iohexol (OMNIPAQUE) 350 MG/ML injection 60 mL (60 mLs Intravenous Contrast Given 11/03/22 2123)  fentaNYL (SUBLIMAZE) injection 50 mcg (50 mcg Intravenous Given 11/03/22 2130)  LORazepam (ATIVAN) injection 0.5 mg (0.5 mg Intravenous Given 11/03/22 2128)    ED Course/ Medical Decision Making/ A&P                                 Medical Decision Making Amount and/or Complexity of Data Reviewed Labs: ordered. Radiology: ordered. ECG/medicine tests: ordered.  Risk OTC drugs. Prescription drug management.   57 year old female with past medical history and HPI as above. When I entered the room, patient was tachypneic, tachycardic, and febrile to greater than 102F.  She was ill-appearing and in discomfort.  Her abdominal exam was soft, but had severe tenderness throughout.  Not peritonitic.  Patient was provided Zofran in triage, but this was ineffective.  Her EKG shows a QTc of 490, therefore Ativan was given to help with nausea and vomiting.  Protonix also promptly administered due to concern for possible gastric  bleeding.  In addition, given her fevers, tachycardic, severe abdominal pain, she was started on IV fluids full 30 cc/kg as well as broad-spectrum antibiotics.  Definite concern for sepsis. Labs performed and resulted above. Pertinent lab findings include white blood cell count of 13.8.  Mild AKI with creatinine of 1.56.  Lactic acid was 5.4. CT Imaging abnormalities include mild pyloric wall thickening, but no other acute intra-abdominal findings.  Chest x-ray negative for focal consolidation.  Blood cultures x2 were drawn prior to infusion of antibiotics.   EKG shows sinus tachycardia.  No obvious ST segment elevations or depressions.  She does have Q waves in the anterior leads.  Not a STEMI. Qtc 490.   At time of sign out, urinalysis and repeat lactic acid remain pending.  Anticipate patient will need admission to the hospital for continued evaluation of her febrile illness significant acidosis.  Patient admitted to  in stable condition. The plan for this patient was discussed with Dr. Wilkie Aye, who voiced agreement and who oversaw evaluation and treatment of this patient.       Final Clinical Impression(s) / ED Diagnoses Final diagnoses:  Generalized abdominal pain  Fever in adult    Rx / DC Orders ED Discharge Orders     None

## 2022-11-03 NOTE — Progress Notes (Signed)
ED Pharmacy Antibiotic Sign Off An antibiotic consult was received from an ED provider for vancomycin per pharmacy dosing for intra-abdominal infection. A chart review was completed to assess appropriateness.   The following one time order(s) were placed:  Vancomycin 1500 mg IV x 1  Further antibiotic and/or antibiotic pharmacy consults should be ordered by the admitting provider if indicated.   Thank you for allowing pharmacy to be a part of this patient's care.   Daylene Posey, [facility]  Clinical Pharmacist 11/03/22 7:30 PM

## 2022-11-03 NOTE — ED Notes (Signed)
Patient to CT.

## 2022-11-03 NOTE — Sepsis Progress Note (Signed)
Code sepsis ordered, but canceled immediately after ordering.  No further monitoring by eLink.

## 2022-11-04 ENCOUNTER — Encounter (HOSPITAL_COMMUNITY): Payer: Self-pay | Admitting: Family Medicine

## 2022-11-04 DIAGNOSIS — E872 Acidosis, unspecified: Secondary | ICD-10-CM | POA: Diagnosis present

## 2022-11-04 DIAGNOSIS — R112 Nausea with vomiting, unspecified: Secondary | ICD-10-CM

## 2022-11-04 DIAGNOSIS — E1165 Type 2 diabetes mellitus with hyperglycemia: Secondary | ICD-10-CM

## 2022-11-04 DIAGNOSIS — N1831 Chronic kidney disease, stage 3a: Secondary | ICD-10-CM

## 2022-11-04 DIAGNOSIS — E1142 Type 2 diabetes mellitus with diabetic polyneuropathy: Secondary | ICD-10-CM | POA: Diagnosis present

## 2022-11-04 DIAGNOSIS — R9389 Abnormal findings on diagnostic imaging of other specified body structures: Secondary | ICD-10-CM | POA: Diagnosis not present

## 2022-11-04 DIAGNOSIS — K2289 Other specified disease of esophagus: Secondary | ICD-10-CM | POA: Diagnosis not present

## 2022-11-04 DIAGNOSIS — K2100 Gastro-esophageal reflux disease with esophagitis, without bleeding: Secondary | ICD-10-CM | POA: Diagnosis present

## 2022-11-04 DIAGNOSIS — N179 Acute kidney failure, unspecified: Secondary | ICD-10-CM

## 2022-11-04 DIAGNOSIS — E860 Dehydration: Secondary | ICD-10-CM | POA: Diagnosis present

## 2022-11-04 DIAGNOSIS — R1110 Vomiting, unspecified: Secondary | ICD-10-CM | POA: Diagnosis not present

## 2022-11-04 DIAGNOSIS — I2510 Atherosclerotic heart disease of native coronary artery without angina pectoris: Secondary | ICD-10-CM | POA: Diagnosis present

## 2022-11-04 DIAGNOSIS — R109 Unspecified abdominal pain: Secondary | ICD-10-CM

## 2022-11-04 DIAGNOSIS — K920 Hematemesis: Secondary | ICD-10-CM | POA: Diagnosis not present

## 2022-11-04 DIAGNOSIS — Z794 Long term (current) use of insulin: Secondary | ICD-10-CM | POA: Diagnosis not present

## 2022-11-04 DIAGNOSIS — J45909 Unspecified asthma, uncomplicated: Secondary | ICD-10-CM | POA: Diagnosis present

## 2022-11-04 DIAGNOSIS — K279 Peptic ulcer, site unspecified, unspecified as acute or chronic, without hemorrhage or perforation: Secondary | ICD-10-CM

## 2022-11-04 DIAGNOSIS — R1084 Generalized abdominal pain: Secondary | ICD-10-CM | POA: Diagnosis present

## 2022-11-04 DIAGNOSIS — I10 Essential (primary) hypertension: Secondary | ICD-10-CM

## 2022-11-04 DIAGNOSIS — K3184 Gastroparesis: Secondary | ICD-10-CM | POA: Diagnosis present

## 2022-11-04 DIAGNOSIS — E876 Hypokalemia: Secondary | ICD-10-CM | POA: Diagnosis present

## 2022-11-04 DIAGNOSIS — E1143 Type 2 diabetes mellitus with diabetic autonomic (poly)neuropathy: Secondary | ICD-10-CM | POA: Diagnosis present

## 2022-11-04 DIAGNOSIS — G894 Chronic pain syndrome: Secondary | ICD-10-CM | POA: Diagnosis present

## 2022-11-04 DIAGNOSIS — K2971 Gastritis, unspecified, with bleeding: Secondary | ICD-10-CM | POA: Diagnosis present

## 2022-11-04 DIAGNOSIS — R0781 Pleurodynia: Secondary | ICD-10-CM | POA: Diagnosis present

## 2022-11-04 DIAGNOSIS — R6510 Systemic inflammatory response syndrome (SIRS) of non-infectious origin without acute organ dysfunction: Secondary | ICD-10-CM | POA: Diagnosis present

## 2022-11-04 DIAGNOSIS — I429 Cardiomyopathy, unspecified: Secondary | ICD-10-CM | POA: Diagnosis present

## 2022-11-04 DIAGNOSIS — E785 Hyperlipidemia, unspecified: Secondary | ICD-10-CM

## 2022-11-04 DIAGNOSIS — I129 Hypertensive chronic kidney disease with stage 1 through stage 4 chronic kidney disease, or unspecified chronic kidney disease: Secondary | ICD-10-CM | POA: Diagnosis present

## 2022-11-04 DIAGNOSIS — Z1152 Encounter for screening for COVID-19: Secondary | ICD-10-CM | POA: Diagnosis not present

## 2022-11-04 DIAGNOSIS — E1122 Type 2 diabetes mellitus with diabetic chronic kidney disease: Secondary | ICD-10-CM | POA: Diagnosis present

## 2022-11-04 DIAGNOSIS — K2970 Gastritis, unspecified, without bleeding: Secondary | ICD-10-CM | POA: Diagnosis not present

## 2022-11-04 HISTORY — DX: Acute kidney failure, unspecified: N17.9

## 2022-11-04 LAB — CBC
HCT: 39.1 % (ref 36.0–46.0)
Hemoglobin: 13.2 g/dL (ref 12.0–15.0)
MCH: 29.1 pg (ref 26.0–34.0)
MCHC: 33.8 g/dL (ref 30.0–36.0)
MCV: 86.3 fL (ref 80.0–100.0)
Platelets: 271 10*3/uL (ref 150–400)
RBC: 4.53 MIL/uL (ref 3.87–5.11)
RDW: 14.4 % (ref 11.5–15.5)
WBC: 15 10*3/uL — ABNORMAL HIGH (ref 4.0–10.5)
nRBC: 0 % (ref 0.0–0.2)

## 2022-11-04 LAB — BASIC METABOLIC PANEL
Anion gap: 12 (ref 5–15)
BUN: 21 mg/dL — ABNORMAL HIGH (ref 6–20)
CO2: 24 mmol/L (ref 22–32)
Calcium: 8.9 mg/dL (ref 8.9–10.3)
Chloride: 97 mmol/L — ABNORMAL LOW (ref 98–111)
Creatinine, Ser: 1.09 mg/dL — ABNORMAL HIGH (ref 0.44–1.00)
GFR, Estimated: 59 mL/min — ABNORMAL LOW (ref >60)
Glucose, Bld: 202 mg/dL — ABNORMAL HIGH (ref 70–99)
Potassium: 3.6 mmol/L (ref 3.5–5.1)
Sodium: 133 mmol/L — ABNORMAL LOW (ref 135–145)

## 2022-11-04 LAB — MAGNESIUM: Magnesium: 1.4 mg/dL — ABNORMAL LOW (ref 1.7–2.4)

## 2022-11-04 LAB — URINALYSIS, W/ REFLEX TO CULTURE (INFECTION SUSPECTED)
Bacteria, UA: NONE SEEN
Bilirubin Urine: NEGATIVE
Glucose, UA: 50 mg/dL — AB
Hgb urine dipstick: NEGATIVE
Ketones, ur: 20 mg/dL — AB
Nitrite: NEGATIVE
Protein, ur: 30 mg/dL — AB
Specific Gravity, Urine: 1.036 — ABNORMAL HIGH (ref 1.005–1.030)
pH: 5 (ref 5.0–8.0)

## 2022-11-04 LAB — PROCALCITONIN: Procalcitonin: <0.1 ng/mL

## 2022-11-04 LAB — GLUCOSE, CAPILLARY
Glucose-Capillary: 196 mg/dL — ABNORMAL HIGH (ref 70–99)
Glucose-Capillary: 203 mg/dL — ABNORMAL HIGH (ref 70–99)
Glucose-Capillary: 157 mg/dL — ABNORMAL HIGH (ref 70–99)
Glucose-Capillary: 173 mg/dL — ABNORMAL HIGH (ref 70–99)
Glucose-Capillary: 154 mg/dL — ABNORMAL HIGH (ref 70–99)
Glucose-Capillary: 159 mg/dL — ABNORMAL HIGH (ref 70–99)

## 2022-11-04 LAB — I-STAT CG4 LACTIC ACID, ED: Lactic Acid, Venous: 1.7 mmol/L (ref 0.5–1.9)

## 2022-11-04 MED ORDER — CARVEDILOL 25 MG PO TABS
25.0000 mg | ORAL_TABLET | Freq: Two times a day (BID) | ORAL | Status: DC
  Administered 2022-11-05 – 2022-11-08 (×4): 25 mg via ORAL
  Filled 2022-11-04 (×7): qty 1

## 2022-11-04 MED ORDER — LACTATED RINGERS IV SOLN: INTRAVENOUS | Status: DC

## 2022-11-04 MED ORDER — ACETAMINOPHEN 650 MG RE SUPP: 650.0000 mg | Freq: Four times a day (QID) | RECTAL | Status: DC | PRN

## 2022-11-04 MED ORDER — PANTOPRAZOLE SODIUM 40 MG IV SOLR
40.0000 mg | Freq: Two times a day (BID) | INTRAVENOUS | Status: DC
  Administered 2022-11-04 – 2022-11-06 (×5): 40 mg via INTRAVENOUS
  Filled 2022-11-04 (×5): qty 10

## 2022-11-04 MED ORDER — METHOCARBAMOL 500 MG PO TABS: 750.0000 mg | ORAL_TABLET | Freq: Three times a day (TID) | ORAL | Status: DC | PRN

## 2022-11-04 MED ORDER — INSULIN GLARGINE-YFGN 100 UNIT/ML ~~LOC~~ SOLN
10.0000 [IU] | Freq: Every day | SUBCUTANEOUS | Status: DC
  Administered 2022-11-04 – 2022-11-07 (×4): 10 [IU] via SUBCUTANEOUS
  Filled 2022-11-04 (×5): qty 0.1

## 2022-11-04 MED ORDER — SODIUM CHLORIDE 0.9% FLUSH
3.0000 mL | Freq: Two times a day (BID) | INTRAVENOUS | Status: DC
  Administered 2022-11-04 – 2022-11-08 (×10): 3 mL via INTRAVENOUS

## 2022-11-04 MED ORDER — ACETAMINOPHEN 325 MG PO TABS
650.0000 mg | ORAL_TABLET | Freq: Four times a day (QID) | ORAL | Status: DC | PRN
  Administered 2022-11-08: 650 mg via ORAL
  Filled 2022-11-04: qty 2

## 2022-11-04 MED ORDER — INSULIN ASPART 100 UNIT/ML IJ SOLN
0.0000 [IU] | INTRAMUSCULAR | Status: DC
  Administered 2022-11-04: 1 [IU] via SUBCUTANEOUS
  Administered 2022-11-04: 2 [IU] via SUBCUTANEOUS
  Administered 2022-11-04 – 2022-11-05 (×6): 1 [IU] via SUBCUTANEOUS
  Administered 2022-11-06: 2 [IU] via SUBCUTANEOUS
  Administered 2022-11-06 (×3): 1 [IU] via SUBCUTANEOUS
  Administered 2022-11-07 (×2): 2 [IU] via SUBCUTANEOUS

## 2022-11-04 MED ORDER — ONDANSETRON HCL 4 MG/2ML IJ SOLN
4.0000 mg | Freq: Four times a day (QID) | INTRAMUSCULAR | Status: DC | PRN
  Administered 2022-11-04 (×2): 4 mg via INTRAVENOUS
  Filled 2022-11-04 (×2): qty 2

## 2022-11-04 MED ORDER — OXYCODONE HCL 5 MG PO TABS
5.0000 mg | ORAL_TABLET | ORAL | Status: DC | PRN
  Administered 2022-11-04 – 2022-11-06 (×4): 5 mg via ORAL
  Filled 2022-11-04 (×4): qty 1

## 2022-11-04 MED ORDER — METOCLOPRAMIDE HCL 5 MG/ML IJ SOLN
10.0000 mg | INTRAMUSCULAR | Status: AC
  Administered 2022-11-04: 10 mg via INTRAVENOUS
  Filled 2022-11-04: qty 2

## 2022-11-04 MED ORDER — SODIUM CHLORIDE 0.9 % IV SOLN
12.5000 mg | Freq: Four times a day (QID) | INTRAVENOUS | Status: DC | PRN
  Administered 2022-11-04 – 2022-11-08 (×8): 12.5 mg via INTRAVENOUS
  Filled 2022-11-04: qty 12.5
  Filled 2022-11-04: qty 0.5
  Filled 2022-11-04 (×6): qty 12.5

## 2022-11-04 MED ORDER — SUCRALFATE 1 GM/10ML PO SUSP
1.0000 g | Freq: Four times a day (QID) | ORAL | Status: DC
  Administered 2022-11-04 – 2022-11-08 (×16): 1 g via ORAL
  Filled 2022-11-04 (×18): qty 10

## 2022-11-04 MED ORDER — HYDROMORPHONE HCL 1 MG/ML IJ SOLN
0.5000 mg | INTRAMUSCULAR | Status: DC | PRN
  Administered 2022-11-04 – 2022-11-08 (×16): 0.5 mg via INTRAVENOUS
  Filled 2022-11-04 (×16): qty 0.5

## 2022-11-04 MED ORDER — ATORVASTATIN CALCIUM 80 MG PO TABS
80.0000 mg | ORAL_TABLET | Freq: Every day | ORAL | Status: DC
  Administered 2022-11-05 – 2022-11-08 (×4): 80 mg via ORAL
  Filled 2022-11-04 (×5): qty 1

## 2022-11-04 MED ORDER — GABAPENTIN 300 MG PO CAPS
300.0000 mg | ORAL_CAPSULE | Freq: Three times a day (TID) | ORAL | Status: DC
  Administered 2022-11-04 – 2022-11-07 (×9): 300 mg via ORAL
  Filled 2022-11-04 (×12): qty 1

## 2022-11-04 MED ORDER — MAGNESIUM SULFATE 2 GM/50ML IV SOLN
2.0000 g | Freq: Once | INTRAVENOUS | Status: AC
  Administered 2022-11-04: 2 g via INTRAVENOUS
  Filled 2022-11-04: qty 50

## 2022-11-04 MED ORDER — AMLODIPINE BESYLATE 5 MG PO TABS
5.0000 mg | ORAL_TABLET | Freq: Every day | ORAL | Status: DC
  Administered 2022-11-05 – 2022-11-08 (×4): 5 mg via ORAL
  Filled 2022-11-04 (×5): qty 1

## 2022-11-04 MED ORDER — ALBUTEROL SULFATE (2.5 MG/3ML) 0.083% IN NEBU: 2.5000 mg | INHALATION_SOLUTION | Freq: Four times a day (QID) | RESPIRATORY_TRACT | Status: DC | PRN

## 2022-11-04 MED ORDER — SENNOSIDES-DOCUSATE SODIUM 8.6-50 MG PO TABS: 1.0000 | ORAL_TABLET | Freq: Every evening | ORAL | Status: DC | PRN

## 2022-11-04 NOTE — ED Provider Notes (Signed)
2:08 AM Patient with persistent nausea. Previously receiving Ativan for nausea due to QTc 493. Will give Reglan.  Has been aggressively hydrated; lactic has cleared. Though, currently tachycardic at bedside to 114bpm.   Given SIRS criteria (no clear source of infection for sepsis), persistent N/V plan for admission for ongoing management. Case discussed with MD Opyd of [facility] who will admit.   Vitals:   11/04/22 0000 11/04/22 0015 11/04/22 0030 11/04/22 0045  BP: 114/80 (!) 158/97 (!) 154/90 (!) 152/113  Pulse: 91 94 (!) 104 87  Resp: 19 16 14 15   Temp:      TempSrc:      SpO2: 100% 99% 99% 98%      Antony Madura, PA-C 11/04/22 1610    Glynn Octave, MD 11/04/22 [phone]

## 2022-11-04 NOTE — Progress Notes (Signed)
MEWS Progress Note  Patient Details Name: LENSEY ALVARADO MRN: [id number] DOB: 1965/03/07 Today's Date: 11/04/2022   MEWS Flowsheet Documentation:  Assess: MEWS Score Temp: 98.1 F (36.7 C) BP: 136/86 MAP (mmHg): 101 Pulse Rate: 99 ECG Heart Rate: 98 Resp: 16 Level of Consciousness: Alert SpO2: 100 % O2 Device: Room Air Assess: MEWS Score MEWS Temp: 0 MEWS Systolic: 0 MEWS Pulse: 0 MEWS RR: 0 MEWS LOC: 0 MEWS Score: 0 MEWS Score Color: Green Assess: SIRS CRITERIA SIRS Temperature : 0 SIRS Respirations : 0 SIRS Pulse: 1 SIRS WBC: 1 SIRS Score Sum : 2 SIRS Temperature : 0 SIRS Pulse: 1 SIRS Respirations : 0 SIRS WBC: 1 SIRS Score Sum : 2        Santiago Bumpers 11/04/2022, 12:39 PM

## 2022-11-04 NOTE — H&P (Signed)
History and Physical    [name]:[id number] DOB: July 27, 1965 DOA: 11/03/2022  PCP: Esperanza Richters, PA-C   Patient coming from: Home   Chief Complaint: Abdominal pain, N/V   HPI: [name] is a 57 y.o. female with medical history significant for hypertension, hyperlipidemia, diabetes mellitus, CKD 3A, peptic ulcer disease, and anxiety who presents with abdominal pain, nausea, and vomiting.  Patient was in her usual state until 11/01/2022 when she developed pain in the mid and upper abdomen with nausea and recurrent bouts of vomiting.  Symptoms have persisted and she reports being unable to tolerate anything by mouth now.  She denies diarrhea, fevers, chills, or urinary symptoms.  She had gastric ulcers on EGD in 2011 that were suspected to be vomiting induced.  She had abnormally delayed emptying on gastric emptying study in 2011, but repeat study was normal in 2019.    ED Course: Upon arrival to the ED, patient is found to mild tachycardia with systolic blood pressure of 93 and greater.  Labs were most notable for glucose turn 59, bicarbonate 18, creatinine 1.56, WBC 13,800, and lactic acid 5.4. CT abdomen and pelvis findings suggest possible mild pyloric wall thickening.  No acute findings noted on chest x-ray.  Blood cultures were collected and the patient was treated with vancomycin, Rocephin, Flagyl, IV Protonix, IV Ativan x 2, 2.5 L of LR, acetaminophen, 3 doses of fentanyl, and Reglan in the ED.  Review of Systems:  All other systems reviewed and apart from HPI, are negative.  Past Medical History:  Diagnosis Date   Abnormal Pap smear of cervix    Anxiety 01/22/2016   Asthma    Cardiac murmur 02/01/2019   Cardiomyopathy, secondary (HCC) 09/06/2009   Qualifier: Diagnosis of  By: Jolene Provost   Formatting of this note might be different from the original. Overview:  Qualifier: Diagnosis of  By: Jolene Provost   Cervical cancer  [facility]) 2001   Radical hysterectomy in [facility]   Chronic kidney disease, stage III (moderate) (HCC) 05/23/2014   Chronic pain syndrome 01/23/2016   Coronary artery disease    30% lesions noted   Depression    Diabetes type 2, uncontrolled 05/23/2014   Diabetic gastroparesis associated with type 1 diabetes mellitus (HCC) 11/27/2016   DIABETIC PERIPHERAL NEUROPATHY 09/21/2009   Qualifier: Diagnosis of  By: Delrae Alfred MD, [name]     Essential hypertension 08/30/2009   Qualifier: Diagnosis of  By: Cristela Felt, CNA, Christy     Gastroesophageal reflux disease 07/30/2009   Formatting of this note might be different from the original. Overview:  Overview:  Qualifier: Diagnosis of  By: Delrae Alfred MD, Alfonso Patten: Diagnosis of  By: Wilmon Pali NP, Consuela Mimes of this note might be different from the original. Overview:  Qualifier: Diagnosis of  By: Delrae Alfred MD, Alfonso Patten: Diagnosis of  By: Wilmon Pali NP, Gunnar Fusi   GERD (gastroesophageal reflux disease)    History of cervical cancer 08/17/2015   Status post partial hysterectomy  Formatting of this note might be different from the original. Status post partial hysterectomy   Hyperlipidemia    INSOMNIA 09/21/2009   Qualifier: Diagnosis of  By: Delrae Alfred MD, [name]     Lumbar facet arthropathy 05/14/2016   Lumbar spinal stenosis 02/11/2018   Metatarsalgia of both feet 03/11/2017   Migraine 09/13/2012   Overview:  IMPRESSION: possible abd migraine   TOBACCO ABUSE 09/21/2009   Qualifier: Diagnosis of  By: Delrae Alfred MD,  Beverely Low of this note might be different from the original. Overview:  Qualifier: Diagnosis of  By: Delrae Alfred MD, Lanora Manis   Trigeminal neuralgia    ULCER-GASTRIC 09/28/2009   Qualifier: Diagnosis of  By: Wilmon Pali NP, Leeann Must, CANDIDAL 12/25/2009   Qualifier: Diagnosis of  By: Delrae Alfred MD, Hubbard Robinson D deficiency 04/25/2013    Past Surgical History:  Procedure Laterality Date    ABDOMINAL HYSTERECTOMY     radical hysterectomy   AMPUTATION Left 01/06/2022   Procedure: AMPUTATION 2nd TOE;  Surgeon: Nadara Mustard, MD;  Location: [facility] OR;  Service: Orthopedics;  Laterality: Left;   CHOLECYSTECTOMY     I & D EXTREMITY Left 04/24/2021   Procedure: LEFT LEG DEBRIDEMENT;  Surgeon: Nadara Mustard, MD;  Location: [facility] OR;  Service: Orthopedics;  Laterality: Left;   I & D EXTREMITY Left 05/01/2021   Procedure: LEFT KNEE DEBRIDEMENT;  Surgeon: Nadara Mustard, MD;  Location: [facility] OR;  Service: Orthopedics;  Laterality: Left;    Social History:   reports that she quit smoking about 34 years ago. Her smoking use included cigarettes. She has never used smokeless tobacco. She reports current alcohol use. She reports that she does not use drugs.  Allergies  Allergen Reactions   Lisinopril Swelling    Angioedema   Oxycodone-Acetaminophen Nausea Only   Latex Itching, Swelling and Rash   Morphine Itching and Rash    Family History  Problem Relation Age of Onset   Breast cancer Mother    Stroke Mother    Hypertension Mother    Diabetes Mother    Hypertension Father    Diabetes Father    Heart attack Father    Hypertension Sister    Diabetes Sister    Hypertension Brother    Diabetes Brother    Hypertension Brother    Diabetes Brother    Stroke Maternal Grandmother      Prior to Admission medications   Medication Sig Start Date End Date Taking? Authorizing Provider  albuterol (VENTOLIN HFA) 108 (90 Base) MCG/ACT inhaler Inhale 2 puffs into the lungs every 6 (six) hours as needed for wheezing or shortness of breath. Patient taking differently: Inhale 2 puffs into the lungs 3 (three) times daily as needed for wheezing or shortness of breath. 08/03/20   Saguier, Ramon Dredge, PA-C  amLODipine (NORVASC) 5 MG tablet Take 1 tablet (5 mg total) by mouth daily. 01/13/22 01/08/23  Saguier, Ramon Dredge, PA-C  aspirin 81 MG tablet Take 81 mg by mouth daily.    [provider]   atorvastatin (LIPITOR) 80 MG tablet Take 1 tablet (80 mg total) by mouth daily. 01/13/22   Saguier, Ramon Dredge, PA-C  baclofen (LIORESAL) 10 MG tablet Take 1 tablet (10 mg total) by mouth 3 (three) times daily. 09/27/22   Carlisle Beers, FNP  busPIRone (BUSPAR) 15 MG tablet Take 1 tablet (15 mg total) by mouth 2 (two) times daily. Patient taking differently: Take 15 mg by mouth daily as needed (anxiety). 01/13/22   Saguier, Ramon Dredge, PA-C  carvedilol (COREG) 25 MG tablet Take 1 tablet (25 mg total) by mouth 2 (two) times daily with a meal. 06/18/22   Saguier, Ramon Dredge, PA-C  Continuous Glucose Sensor (FREESTYLE LIBRE 3 SENSOR) MISC USE AS DIRECTED TO MONITOR BLOOD SUGAR. CHANGE EVERY 14 DAYS 08/07/22   [provider]  dicyclomine (BENTYL) 20 MG tablet Take 1 tablet (20 mg total) by mouth 2 (two) times  daily. 07/28/22   Carmel Sacramento A, PA-C  fluticasone (FLONASE) 50 MCG/ACT nasal spray Place 2 sprays into both nostrils daily. 01/13/22   Saguier, Ramon Dredge, PA-C  fluticasone furoate-vilanterol (BREO ELLIPTA) 100-25 MCG/INH AEPB Inhale 1 puff into the lungs daily. 08/03/20   Saguier, Ramon Dredge, PA-C  gabapentin (NEURONTIN) 300 MG capsule TAKE 2 CAPSULES BY MOUTH THREE TIMES DAILY 08/12/22   Saguier, Ramon Dredge, PA-C  Insulin Glargine [facility]) 100 UNIT/ML INJECT 40 UNITS SUBCUTANEOUSLY AT BEDTIME 08/12/22   Saguier, Ramon Dredge, PA-C  Insulin Pen Needle (PEN NEEDLES 31GX5/16") 31G X 8 MM MISC Use daily as directed 03/28/22   Saguier, Ramon Dredge, PA-C  metFORMIN (GLUCOPHAGE-XR) 500 MG 24 hr tablet Take 1 tablet (500 mg total) by mouth daily with breakfast. 01/22/22   Saguier, Ramon Dredge, PA-C  methocarbamol (ROBAXIN) 750 MG tablet Take 1 tablet (750 mg total) by mouth every 8 (eight) hours as needed for muscle spasms. 09/18/22   Saguier, Ramon Dredge, PA-C  nitroGLYCERIN (NITROSTAT) 0.4 MG SL tablet Place 1 tablet (0.4 mg total) under the tongue every 5 (five) minutes as needed for chest pain. 03/21/21   Saguier, Ramon Dredge, PA-C   ondansetron (ZOFRAN) 4 MG tablet Take 4 mg by mouth every 8 (eight) hours as needed for nausea or vomiting.    [provider]  Oxycodone HCl 10 MG TABS 1 tab po bid prn severe pain. 08/20/22   Saguier, Ramon Dredge, PA-C  OZEMPIC, 0.25 OR 0.5 MG/DOSE, 2 MG/3ML SOPN Inject into the skin. 10/14/22   [provider]  promethazine (PHENERGAN) 12.5 MG tablet TAKE 1 TABLET BY MOUTH EVERY 8 HOURS AS NEEDED FOR NAUSEA FOR VOMITING 08/12/22   Saguier, Ramon Dredge, PA-C  topiramate (TOPAMAX) 50 MG tablet Take 1 tablet (50 mg total) by mouth 2 (two) times daily. Patient taking differently: Take 50 mg by mouth 2 (two) times daily as needed (headache). 01/13/22   Saguier, Ramon Dredge, PA-C  VITAMIN D PO Take 5,000 Units by mouth daily.    [provider]    Physical Exam: Vitals:   11/04/22 0000 11/04/22 0015 11/04/22 0030 11/04/22 0045  BP: 114/80 (!) 158/97 (!) 154/90 (!) 152/113  Pulse: 91 94 (!) 104 87  Resp: 19 16 14 15   Temp:      TempSrc:      SpO2: 100% 99% 99% 98%    Constitutional: NAD, no pallor or diaphoresis  Eyes: PERTLA, lids and conjunctivae normal ENMT: Mucous membranes are dry. Posterior pharynx clear of any exudate or lesions.   Neck: supple, no masses  Respiratory: no wheezing, no crackles. No accessory muscle use.  Cardiovascular: Rate ~100 and regular. No extremity edema.  Abdomen: soft, no distension, no guarding. Bowel sounds hypoactive.  Musculoskeletal: no clubbing / cyanosis. No joint deformity upper and lower extremities.   Skin: no significant rashes, lesions, ulcers. Warm, dry, well-perfused. Neurologic: CN 2-12 grossly intact. Moving all extremities. Alert and oriented.  Psychiatric: Calm. Cooperative.    Labs and Imaging on Admission: I have personally reviewed following labs and imaging studies  CBC: Recent Labs  Lab 11/03/22 1925  WBC 13.8*  NEUTROABS 9.9*  HGB 13.7  HCT 41.2  MCV 84.4  PLT 320   Basic Metabolic Panel: Recent Labs  Lab  11/03/22 1925  NA 137  K 4.9  CL 101  CO2 18*  GLUCOSE 259*  BUN 33*  CREATININE 1.56*  CALCIUM 9.5   GFR: CrCl cannot be calculated (Unknown ideal weight.). Liver Function Tests: Recent Labs  Lab 11/03/22 1925  AST 19  ALT 15  ALKPHOS 121  BILITOT 0.7  PROT 8.7*  ALBUMIN 3.5   No results for input(s): "LIPASE", "AMYLASE" in the last 168 hours. No results for input(s): "AMMONIA" in the last 168 hours. Coagulation Profile: Recent Labs  Lab 11/03/22 1925  INR 1.1   Cardiac Enzymes: No results for input(s): "CKTOTAL", "CKMB", "CKMBINDEX", "TROPONINI" in the last 168 hours. BNP (last 3 results) No results for input(s): "PROBNP" in the last 8760 hours. HbA1C: No results for input(s): "HGBA1C" in the last 72 hours. CBG: Recent Labs  Lab 11/03/22 1855  GLUCAP 233*   Lipid Profile: No results for input(s): "CHOL", "HDL", "LDLCALC", "TRIG", "CHOLHDL", "LDLDIRECT" in the last 72 hours. Thyroid Function Tests: No results for input(s): "TSH", "T4TOTAL", "FREET4", "T3FREE", "THYROIDAB" in the last 72 hours. Anemia Panel: No results for input(s): "VITAMINB12", "FOLATE", "FERRITIN", "TIBC", "IRON", "RETICCTPCT" in the last 72 hours. Urine analysis:    Component Value Date/Time   COLORURINE YELLOW 11/04/2022 0038   APPEARANCEUR CLEAR 11/04/2022 0038   LABSPEC 1.036 (H) 11/04/2022 0038   PHURINE 5.0 11/04/2022 0038   GLUCOSEU 50 (A) 11/04/2022 0038   HGBUR NEGATIVE 11/04/2022 0038   HGBUR negative 01/08/2010 1045   BILIRUBINUR NEGATIVE 11/04/2022 0038   BILIRUBINUR Negative 03/27/2022 1536   KETONESUR 20 (A) 11/04/2022 0038   PROTEINUR 30 (A) 11/04/2022 0038   UROBILINOGEN 0.2 (A) 03/27/2022 1536   UROBILINOGEN 1.0 11/14/2013 0741   NITRITE NEGATIVE 11/04/2022 0038   LEUKOCYTESUR TRACE (A) 11/04/2022 0038   Sepsis Labs: @LABRCNTIP (procalcitonin:4,lacticidven:4) ) Recent Results (from the past 240 hour(s))  Resp panel by RT-PCR (RSV, Flu A&B, Covid) Anterior  Nasal Swab     Status: None   Collection Time: 11/03/22  8:50 PM   Specimen: Anterior Nasal Swab  Result Value Ref Range Status   SARS Coronavirus 2 by RT PCR NEGATIVE NEGATIVE Final   Influenza A by PCR NEGATIVE NEGATIVE Final   Influenza B by PCR NEGATIVE NEGATIVE Final    Comment: (NOTE) The Xpert Xpress SARS-CoV-2/FLU/RSV plus assay is intended as an aid in the diagnosis of influenza from Nasopharyngeal swab specimens and should not be used as a sole basis for treatment. Nasal washings and aspirates are unacceptable for Xpert Xpress SARS-CoV-2/FLU/RSV testing.  Fact Sheet for Patients: BloggerCourse.com  Fact Sheet for Healthcare Providers: SeriousBroker.it  This test is not yet approved or cleared by the [place] FDA and has been authorized for detection and/or diagnosis of SARS-CoV-2 by FDA under an Emergency Use Authorization (EUA). This EUA will remain in effect (meaning this test can be used) for the duration of the COVID-19 declaration under Section 564(b)(1) of the Act, 21 U.S.C. section 360bbb-3(b)(1), unless the authorization is terminated or revoked.     Resp Syncytial Virus by PCR NEGATIVE NEGATIVE Final    Comment: (NOTE) Fact Sheet for Patients: BloggerCourse.com  Fact Sheet for Healthcare Providers: SeriousBroker.it  This test is not yet approved or cleared by the [place] FDA and has been authorized for detection and/or diagnosis of SARS-CoV-2 by FDA under an Emergency Use Authorization (EUA). This EUA will remain in effect (meaning this test can be used) for the duration of the COVID-19 declaration under Section 564(b)(1) of the Act, 21 U.S.C. section 360bbb-3(b)(1), unless the authorization is terminated or revoked.  Performed at [facility], [address] [place], [place] [address]      Radiological Exams on Admission: CT ABDOMEN  PELVIS W CONTRAST  Result Date: 11/03/2022 CLINICAL  DATA:  Emesis abdomen pain EXAM: CT ABDOMEN AND PELVIS WITH CONTRAST TECHNIQUE: Multidetector CT imaging of the abdomen and pelvis was performed using the standard protocol following bolus administration of intravenous contrast. RADIATION DOSE REDUCTION: This exam was performed according to the departmental dose-optimization program which includes automated exposure control, adjustment of the mA and/or kV according to patient size and/or use of iterative reconstruction technique. CONTRAST:  60mL OMNIPAQUE IOHEXOL 350 MG/ML SOLN COMPARISON:  Radiograph 11/03/2022, CT 07/28/2022 FINDINGS: Lower chest: Lung bases demonstrate no acute airspace disease. Hepatobiliary: No focal liver abnormality is seen. Status post cholecystectomy. No biliary dilatation. Pancreas: Unremarkable. No pancreatic ductal dilatation or surrounding inflammatory changes. Spleen: Normal in size without focal abnormality. Adrenals/Urinary Tract: Adrenal glands are unremarkable. Kidneys are normal, without renal calculi, focal lesion, or hydronephrosis. Bladder is unremarkable. Stomach/Bowel: Stomach nondistended. Possible mild pyloric wall thickening. No dilated small bowel. Negative appendix. Vascular/Lymphatic: Moderate aortic atherosclerosis. No aneurysm. No suspicious lymph nodes Reproductive: Hysterectomy. No adnexal mass small amount of air in the vagina. Chronic soft tissue fullness of the vagina. Other: Negative for pelvic effusion or free air Musculoskeletal: No acute or suspicious osseous abnormality. Multilevel degenerative changes IMPRESSION: 1. Possible mild pyloric wall thickening, correlate for peptic ulcer disease/gastritis. 2. Otherwise no CT evidence for acute intra-abdominal or pelvic abnormality. 3. Aortic atherosclerosis. Aortic Atherosclerosis (ICD10-I70.0). Electronically Signed   By: Jasmine Pang M.D.   On: 11/03/2022 22:02   DG Abdomen 1 View  Result Date:  11/03/2022 CLINICAL DATA:  Abdomen pain EXAM: ABDOMEN - 1 VIEW COMPARISON:  09/13/2017 FINDINGS: Surgical clips in the right upper quadrant. Nonspecific paucity of bowel gas. Numerous clips in the pelvis. No radiopaque calculi IMPRESSION: Nonspecific paucity of bowel gas. Electronically Signed   By: Jasmine Pang M.D.   On: 11/03/2022 21:56   DG Chest Port 1 View  Result Date: 11/03/2022 CLINICAL DATA:  Possible sepsis chest pain EXAM: PORTABLE CHEST 1 VIEW COMPARISON:  07/28/2022 FINDINGS: The heart size and mediastinal contours are within normal limits. Both lungs are clear. The visualized skeletal structures are unremarkable. IMPRESSION: No active disease. Electronically Signed   By: Jasmine Pang M.D.   On: 11/03/2022 21:55    EKG: Independently reviewed. Sinus tachycardia, rate 112.   Assessment/Plan   1. AKI superimposed on CKD 3A  - No hydronephrosis on CT in ED, likely prerenal in setting of N/V   - Continue IVF hydration, renally-dose medications, repeat chem panel in am    2. Abdominal pain; intractable N/V   - CT notable for pyloric wall-thickening; she has hx of gastric ulcers  - Treated with IV PPI in ED  - Continue PPI q12h, start carafate, continue IVF hydration and antiemetics, monitor electrolytes, npo for now and advance diet as she improves    3. Hypertension  - Continue Norvasc and Coreg as tolerated    4. Type II DM  - A1c was 8.8% in July 2024  - Check CBGs and use long- and short-acting insulin    5. SIRS  - Tachycardia and leukocytosis present on admission  - Blood cultures collected in ED and broad-spectrum antibiotics administered  - No source of infection identified  - Check procalcitonin, follow cultures and clinical course, monitor off of antibiotics for now     DVT prophylaxis: SCDs  Code Status: Full  Level of Care: Level of care: Telemetry Medical Family Communication: None present   Disposition Plan:  Patient is from: home  Anticipated d/c is  to: Home Anticipated  d/c date is: 9/26 or 11/07/22 Patient currently: Pending improved renal function, tolerance of adequate oral intake  Consults called: none  Admission status: Inpatient     Briscoe Deutscher, MD [facility]  11/04/2022, 2:25 AM

## 2022-11-04 NOTE — ED Notes (Signed)
ED TO INPATIENT HANDOFF REPORT  ED Nurse Name and Phone #: Jeanice Lim [phone]  S Name/Age/Gender [name] 57 y.o. female Room/Bed: 039C/039C  Code Status   Code Status: Full Code  Home/SNF/Other Home Patient oriented to: self, place, time, and situation Is this baseline? Yes      Chief Complaint Acute renal failure superimposed on stage 3a chronic kidney disease (HCC) [N17.9, N18.31]  Triage Note     Allergies Allergies  Allergen Reactions   Lisinopril Swelling    Angioedema   Oxycodone-Acetaminophen Nausea Only   Latex Itching, Swelling and Rash   Morphine Itching and Rash    Level of Care/Admitting Diagnosis ED Disposition     ED Disposition  Admit   Condition  --   Comment  Hospital Area: [facility] [[id number]]  Level of Care: Telemetry Medical [[id number]]  May admit patient to [facility] or [facility] if equivalent level of care is available:: Yes  Covid Evaluation: Confirmed COVID Negative  Diagnosis: Acute renal failure superimposed on stage 3a chronic kidney disease [facility]) [[id number]]  Admitting Physician: Briscoe Deutscher [[id number]]  Attending Physician: Briscoe Deutscher [[id number]]  Certification:: I certify this patient will need inpatient services for at least 2 midnights  Expected Medical Readiness: 11/06/2022          B Medical/Surgery History Past Medical History:  Diagnosis Date   Abnormal Pap smear of cervix    Anxiety 01/22/2016   Asthma    Cardiac murmur 02/01/2019   Cardiomyopathy, secondary (HCC) 09/06/2009   Qualifier: Diagnosis of  By: Jolene Provost   Formatting of this note might be different from the original. Overview:  Qualifier: Diagnosis of  By: Jolene Provost   Cervical cancer [facility]) 2001   Radical hysterectomy in [facility]   Chronic kidney disease, stage III (moderate) (HCC) 05/23/2014   Chronic pain syndrome 01/23/2016   Coronary artery disease    30% lesions noted    Depression    Diabetes type 2, uncontrolled 05/23/2014   Diabetic gastroparesis associated with type 1 diabetes mellitus (HCC) 11/27/2016   DIABETIC PERIPHERAL NEUROPATHY 09/21/2009   Qualifier: Diagnosis of  By: Delrae Alfred MD, [name]     Essential hypertension 08/30/2009   Qualifier: Diagnosis of  By: Cristela Felt, CNA, Christy     Gastroesophageal reflux disease 07/30/2009   Formatting of this note might be different from the original. Overview:  Overview:  Qualifier: Diagnosis of  By: Delrae Alfred MD, Alfonso Patten: Diagnosis of  By: Wilmon Pali NP, Consuela Mimes of this note might be different from the original. Overview:  Qualifier: Diagnosis of  By: Delrae Alfred MD, Alfonso Patten: Diagnosis of  By: Wilmon Pali NP, Gunnar Fusi   GERD (gastroesophageal reflux disease)    History of cervical cancer 08/17/2015   Status post partial hysterectomy  Formatting of this note might be different from the original. Status post partial hysterectomy   Hyperlipidemia    INSOMNIA 09/21/2009   Qualifier: Diagnosis of  By: Delrae Alfred MD, [name]     Lumbar facet arthropathy 05/14/2016   Lumbar spinal stenosis 02/11/2018   Metatarsalgia of both feet 03/11/2017   Migraine 09/13/2012   Overview:  IMPRESSION: possible abd migraine   TOBACCO ABUSE 09/21/2009   Qualifier: Diagnosis of  By: Delrae Alfred MD, Beverely Low of this note might be different from the original. Overview:  Qualifier: Diagnosis of  By: Delrae Alfred MD, [name]   Trigeminal neuralgia    ULCER-GASTRIC 09/28/2009  Qualifier: Diagnosis of  By: Wilmon Pali NP, Leeann Must, CANDIDAL 12/25/2009   Qualifier: Diagnosis of  By: Delrae Alfred MD, Hubbard Robinson D deficiency 04/25/2013   Past Surgical History:  Procedure Laterality Date   ABDOMINAL HYSTERECTOMY     radical hysterectomy   AMPUTATION Left 01/06/2022   Procedure: AMPUTATION 2nd TOE;  Surgeon: Nadara Mustard, MD;  Location: [facility] OR;  Service: Orthopedics;  Laterality: Left;    CHOLECYSTECTOMY     I & D EXTREMITY Left 04/24/2021   Procedure: LEFT LEG DEBRIDEMENT;  Surgeon: Nadara Mustard, MD;  Location: [facility] OR;  Service: Orthopedics;  Laterality: Left;   I & D EXTREMITY Left 05/01/2021   Procedure: LEFT KNEE DEBRIDEMENT;  Surgeon: Nadara Mustard, MD;  Location: [facility] OR;  Service: Orthopedics;  Laterality: Left;     A IV Location/Drains/Wounds Patient Lines/Drains/Airways Status     Active Line/Drains/Airways     Name Placement date Placement time Site Days   Peripheral IV 11/03/22 20 G 1.16" Anterior;Right;Upper Arm 11/03/22  1925  Arm  1   Peripheral IV 11/03/22 22 G 2.5" Right;Lateral;Upper Arm 11/03/22  2059  Arm  1            Intake/Output Last 24 hours  Intake/Output Summary (Last 24 hours) at 11/04/2022 0300 Last data filed at 11/04/2022 0000 Gross per 24 hour  Intake 3000 ml  Output --  Net 3000 ml    Labs/Imaging Results for orders placed or performed during the hospital encounter of 11/03/22 (from the past 48 hour(s))  CBG monitoring, ED     Status: Abnormal   Collection Time: 11/03/22  6:55 PM  Result Value Ref Range   Glucose-Capillary 233 (H) 70 - 99 mg/dL    Comment: Glucose reference range applies only to samples taken after fasting for at least 8 hours.  Comprehensive metabolic panel     Status: Abnormal   Collection Time: 11/03/22  7:25 PM  Result Value Ref Range   Sodium 137 135 - 145 mmol/L   Potassium 4.9 3.5 - 5.1 mmol/L   Chloride 101 98 - 111 mmol/L   CO2 18 (L) 22 - 32 mmol/L   Glucose, Bld 259 (H) 70 - 99 mg/dL    Comment: Glucose reference range applies only to samples taken after fasting for at least 8 hours.   BUN 33 (H) 6 - 20 mg/dL   Creatinine, Ser 4.54 (H) 0.44 - 1.00 mg/dL   Calcium 9.5 8.9 - 09.8 mg/dL   Total Protein 8.7 (H) 6.5 - 8.1 g/dL   Albumin 3.5 3.5 - 5.0 g/dL   AST 19 15 - 41 U/L   ALT 15 0 - 44 U/L   Alkaline Phosphatase 121 38 - 126 U/L   Total Bilirubin 0.7 0.3 - 1.2 mg/dL   GFR, Estimated  39 (L) >60 mL/min    Comment: (NOTE) Calculated using the CKD-EPI Creatinine Equation (2021)    Anion gap 18 (H) 5 - 15    Comment: Performed at [facility], [address], [place], [place] [address]  CBC with Differential     Status: Abnormal   Collection Time: 11/03/22  7:25 PM  Result Value Ref Range   WBC 13.8 (H) 4.0 - 10.5 K/uL   RBC 4.88 3.87 - 5.11 MIL/uL   Hemoglobin 13.7 12.0 - 15.0 g/dL   HCT 78.2 95.6 - 21.3 %   MCV 84.4 80.0 - 100.0 fL  MCH 28.1 26.0 - 34.0 pg   MCHC 33.3 30.0 - 36.0 g/dL   RDW 78.4 69.6 - 29.5 %   Platelets 320 150 - 400 K/uL   nRBC 0.0 0.0 - 0.2 %   Neutrophils Relative % 72 %   Neutro Abs 9.9 (H) 1.7 - 7.7 K/uL   Lymphocytes Relative 20 %   Lymphs Abs 2.8 0.7 - 4.0 K/uL   Monocytes Relative 5 %   Monocytes Absolute 0.7 0.1 - 1.0 K/uL   Eosinophils Relative 2 %   Eosinophils Absolute 0.2 0.0 - 0.5 K/uL   Basophils Relative 0 %   Basophils Absolute 0.0 0.0 - 0.1 K/uL   Immature Granulocytes 1 %   Abs Immature Granulocytes 0.07 0.00 - 0.07 K/uL    Comment: Performed at [facility], [address], [place], [place] [address]  Protime-INR     Status: None   Collection Time: 11/03/22  7:25 PM  Result Value Ref Range   Prothrombin Time 14.7 11.4 - 15.2 seconds   INR 1.1 0.8 - 1.2    Comment: (NOTE) INR goal varies based on device and disease states. Performed at [facility], [address], [place], [place] [address]   APTT     Status: None   Collection Time: 11/03/22  7:25 PM  Result Value Ref Range   aPTT 25 24 - 36 seconds    Comment: Performed at [facility], [address] [place], [place] [address]  I-Stat Lactic Acid, ED     Status: Abnormal   Collection Time: 11/03/22  7:39 PM  Result Value Ref Range   Lactic Acid, Venous 5.4 (HH) 0.5 - 1.9 mmol/L   Comment NOTIFIED PHYSICIAN   Resp panel by RT-PCR (RSV, Flu A&B, Covid) Anterior Nasal Swab     Status: None   Collection Time: 11/03/22  8:50 PM    Specimen: Anterior Nasal Swab  Result Value Ref Range   SARS Coronavirus 2 by RT PCR NEGATIVE NEGATIVE   Influenza A by PCR NEGATIVE NEGATIVE   Influenza B by PCR NEGATIVE NEGATIVE    Comment: (NOTE) The Xpert Xpress SARS-CoV-2/FLU/RSV plus assay is intended as an aid in the diagnosis of influenza from Nasopharyngeal swab specimens and should not be used as a sole basis for treatment. Nasal washings and aspirates are unacceptable for Xpert Xpress SARS-CoV-2/FLU/RSV testing.  Fact Sheet for Patients: BloggerCourse.com  Fact Sheet for Healthcare Providers: SeriousBroker.it  This test is not yet approved or cleared by the [place] FDA and has been authorized for detection and/or diagnosis of SARS-CoV-2 by FDA under an Emergency Use Authorization (EUA). This EUA will remain in effect (meaning this test can be used) for the duration of the COVID-19 declaration under Section 564(b)(1) of the Act, 21 U.S.C. section 360bbb-3(b)(1), unless the authorization is terminated or revoked.     Resp Syncytial Virus by PCR NEGATIVE NEGATIVE    Comment: (NOTE) Fact Sheet for Patients: BloggerCourse.com  Fact Sheet for Healthcare Providers: SeriousBroker.it  This test is not yet approved or cleared by the [place] FDA and has been authorized for detection and/or diagnosis of SARS-CoV-2 by FDA under an Emergency Use Authorization (EUA). This EUA will remain in effect (meaning this test can be used) for the duration of the COVID-19 declaration under Section 564(b)(1) of the Act, 21 U.S.C. section 360bbb-3(b)(1), unless the authorization is terminated or revoked.  Performed at [facility], [address], [place],  [place] [address]   I-Stat Lactic Acid, ED     Status: None   Collection Time: 11/04/22 12:29 AM  Result Value Ref Range   Lactic Acid, Venous 1.7 0.5 - 1.9 mmol/L   Urinalysis, w/ Reflex to Culture (Infection Suspected) -Urine, Clean Catch     Status: Abnormal   Collection Time: 11/04/22 12:38 AM  Result Value Ref Range   Specimen Source URINE, CLEAN CATCH    Color, Urine YELLOW YELLOW   APPearance CLEAR CLEAR   Specific Gravity, Urine 1.036 (H) 1.005 - 1.030   pH 5.0 5.0 - 8.0   Glucose, UA 50 (A) NEGATIVE mg/dL   Hgb urine dipstick NEGATIVE NEGATIVE   Bilirubin Urine NEGATIVE NEGATIVE   Ketones, ur 20 (A) NEGATIVE mg/dL   Protein, ur 30 (A) NEGATIVE mg/dL   Nitrite NEGATIVE NEGATIVE   Leukocytes,Ua TRACE (A) NEGATIVE   RBC / HPF 0-5 0 - 5 RBC/hpf   WBC, UA 6-10 0 - 5 WBC/hpf    Comment:        Reflex urine culture not performed if WBC <=10, OR if Squamous epithelial cells >5. If Squamous epithelial cells >5 suggest recollection.    Bacteria, UA NONE SEEN NONE SEEN   Squamous Epithelial / HPF 0-5 0 - 5 /HPF   Mucus PRESENT     Comment: Performed at [facility], [address], [place], [place] [address]   CT ABDOMEN PELVIS W CONTRAST  Result Date: 11/03/2022 CLINICAL DATA:  Emesis abdomen pain EXAM: CT ABDOMEN AND PELVIS WITH CONTRAST TECHNIQUE: Multidetector CT imaging of the abdomen and pelvis was performed using the standard protocol following bolus administration of intravenous contrast. RADIATION DOSE REDUCTION: This exam was performed according to the departmental dose-optimization program which includes automated exposure control, adjustment of the mA and/or kV according to patient size and/or use of iterative reconstruction technique. CONTRAST:  60mL OMNIPAQUE IOHEXOL 350 MG/ML SOLN COMPARISON:  Radiograph 11/03/2022, CT 07/28/2022 FINDINGS: Lower chest: Lung bases demonstrate no acute airspace disease. Hepatobiliary: No focal liver abnormality is seen. Status post cholecystectomy. No biliary dilatation. Pancreas: Unremarkable. No pancreatic ductal dilatation or surrounding inflammatory changes. Spleen: Normal in size without  focal abnormality. Adrenals/Urinary Tract: Adrenal glands are unremarkable. Kidneys are normal, without renal calculi, focal lesion, or hydronephrosis. Bladder is unremarkable. Stomach/Bowel: Stomach nondistended. Possible mild pyloric wall thickening. No dilated small bowel. Negative appendix. Vascular/Lymphatic: Moderate aortic atherosclerosis. No aneurysm. No suspicious lymph nodes Reproductive: Hysterectomy. No adnexal mass small amount of air in the vagina. Chronic soft tissue fullness of the vagina. Other: Negative for pelvic effusion or free air Musculoskeletal: No acute or suspicious osseous abnormality. Multilevel degenerative changes IMPRESSION: 1. Possible mild pyloric wall thickening, correlate for peptic ulcer disease/gastritis. 2. Otherwise no CT evidence for acute intra-abdominal or pelvic abnormality. 3. Aortic atherosclerosis. Aortic Atherosclerosis (ICD10-I70.0). Electronically Signed   By: Jasmine Pang M.D.   On: 11/03/2022 22:02   DG Abdomen 1 View  Result Date: 11/03/2022 CLINICAL DATA:  Abdomen pain EXAM: ABDOMEN - 1 VIEW COMPARISON:  09/13/2017 FINDINGS: Surgical clips in the right upper quadrant. Nonspecific paucity of bowel gas. Numerous clips in the pelvis. No radiopaque calculi IMPRESSION: Nonspecific paucity of bowel gas. Electronically Signed   By: Jasmine Pang M.D.   On: 11/03/2022 21:56   DG Chest Port 1 View  Result Date: 11/03/2022 CLINICAL DATA:  Possible sepsis chest pain EXAM: PORTABLE CHEST 1 VIEW COMPARISON:  07/28/2022 FINDINGS: The heart size and mediastinal contours are within normal  limits. Both lungs are clear. The visualized skeletal structures are unremarkable. IMPRESSION: No active disease. Electronically Signed   By: Jasmine Pang M.D.   On: 11/03/2022 21:55    Pending Labs Unresulted Labs (From admission, onward)     Start     Ordered   11/04/22 0500  Basic metabolic panel  Daily,   R      11/04/22 0225   11/04/22 0500  Magnesium  Tomorrow morning,    R        11/04/22 0225   11/04/22 0500  CBC  Daily,   R      11/04/22 0225   11/04/22 0225  Procalcitonin  Once,   R       References:    Procalcitonin Lower Respiratory Tract Infection AND Sepsis Procalcitonin Algorithm   11/04/22 0225   11/03/22 1913  Blood Culture (routine x 2)  (Septic presentation on arrival (screening labs, nursing and treatment orders for obvious sepsis))  BLOOD CULTURE X 2,   STAT      11/03/22 1913   11/03/22 1911  Culture, blood (single)  (Undifferentiated -> Now sepsis confirmed (treatment and sepsis specific nursing orders))  ONCE - STAT,   STAT        11/03/22 1912            Vitals/Pain Today's Vitals   11/04/22 0030 11/04/22 0045 11/04/22 0211 11/04/22 0218  BP: (!) 154/90 (!) 152/113    Pulse: (!) 104 87    Resp: 14 15    Temp:      TempSrc:      SpO2: 99% 98%    Weight:    81.6 kg  PainSc:   7      Isolation Precautions No active isolations  Medications Medications  amLODipine (NORVASC) tablet 5 mg (has no administration in time range)  atorvastatin (LIPITOR) tablet 80 mg (has no administration in time range)  carvedilol (COREG) tablet 25 mg (has no administration in time range)  gabapentin (NEURONTIN) capsule 300 mg (has no administration in time range)  methocarbamol (ROBAXIN) tablet 750 mg (has no administration in time range)  albuterol (VENTOLIN HFA) 108 (90 Base) MCG/ACT inhaler 2 puff (has no administration in time range)  insulin glargine-yfgn (SEMGLEE) injection 10 Units (has no administration in time range)  insulin aspart (novoLOG) injection 0-6 Units (has no administration in time range)  sodium chloride flush (NS) 0.9 % injection 3 mL (has no administration in time range)  acetaminophen (TYLENOL) tablet 650 mg (has no administration in time range)    Or  acetaminophen (TYLENOL) suppository 650 mg (has no administration in time range)  oxyCODONE (Oxy IR/ROXICODONE) immediate release tablet 5 mg (has no administration in  time range)  HYDROmorphone (DILAUDID) injection 0.5 mg (has no administration in time range)  senna-docusate (Senokot-S) tablet 1 tablet (has no administration in time range)  ondansetron (ZOFRAN) injection 4 mg (has no administration in time range)  pantoprazole (PROTONIX) injection 40 mg (has no administration in time range)  sucralfate (CARAFATE) 1 GM/10ML suspension 1 g (has no administration in time range)  lactated ringers infusion (has no administration in time range)  fentaNYL (SUBLIMAZE) injection 50 mcg (50 mcg Intravenous Given 11/03/22 1913)  acetaminophen (TYLENOL) suppository 650 mg (650 mg Rectal Given 11/03/22 1916)  lactated ringers bolus 1,000 mL (0 mLs Intravenous Stopped 11/04/22 0000)    And  lactated ringers bolus 1,000 mL (0 mLs Intravenous Stopped 11/04/22 0000)    And  lactated ringers  bolus 500 mL (0 mLs Intravenous Stopped 11/04/22 0000)  cefTRIAXone (ROCEPHIN) 2 g in sodium chloride 0.9 % 100 mL IVPB (0 g Intravenous Stopped 11/03/22 2022)  metroNIDAZOLE (FLAGYL) IVPB 500 mg (0 mg Intravenous Stopped 11/03/22 2117)  pantoprazole (PROTONIX) injection 40 mg (40 mg Intravenous Given 11/03/22 1936)  LORazepam (ATIVAN) injection 0.5 mg (0.5 mg Intravenous Given 11/03/22 1935)  vancomycin (VANCOREADY) IVPB 1500 mg/300 mL (0 mg Intravenous Stopped 11/03/22 2342)  fentaNYL (SUBLIMAZE) injection 50 mcg (50 mcg Intravenous Given 11/03/22 1946)  iohexol (OMNIPAQUE) 350 MG/ML injection 60 mL (60 mLs Intravenous Contrast Given 11/03/22 2123)  fentaNYL (SUBLIMAZE) injection 50 mcg (50 mcg Intravenous Given 11/03/22 2130)  LORazepam (ATIVAN) injection 0.5 mg (0.5 mg Intravenous Given 11/03/22 2128)  metoCLOPramide (REGLAN) injection 10 mg (10 mg Intravenous Given 11/04/22 0215)    Mobility walks with person assist     Focused Assessments Cardiac Assessment Handoff:    Lab Results  Component Value Date   CKTOTAL 167 09/11/2009   CKMB 1.7 09/11/2009   TROPONINI <0.03 09/30/2014    Lab Results  Component Value Date   DDIMER 0.47 04/16/2020   Does the Patient currently have chest pain? No    R Recommendations: See Admitting Provider Note  Report given to:   Additional Notes:

## 2022-11-04 NOTE — Progress Notes (Signed)
Patient seen and examined.  Continues to have vomiting but wants to try some crackers and ice water.  Diffuse abdominal pain but better than before.  Admitted early morning hours by nighttime hospitalist.  H&P and assessment plan as per admitting doctor.  In brief, 57 year old with history of hypertension, hyperlipidemia, type 2 diabetes on insulin, CKD stage IIIa, history of peptic ulcer disease and anxiety presented with 3 days of abdominal pain, nausea and intractable vomiting without any aggravating factors.  Unable to eat anything so came to the ER.  in the emergency room was initially tachycardic, blood pressure 90.  WBC 13.8, lactic acid 5.4.  CT scan abdomen pelvis without significant findings except pyloric wall thickening.  Patient with history of EGD and gastric emptying test that was normal in the past.  AKI on CKD stage IIIa due to dehydration Intractable nausea vomiting, suspect gastroparesis Lactic acidosis and tachycardia due to dehydration, procalcitonin less than 0.1.  Unlikely bacterial infection.  Patient was treated with isotonic fluid resuscitation and currently remains on maintenance fluid.  Clinically improving.  Continue maintenance IV fluids, adequate nausea medications.  Adequate pain medications.  IV Protonix.  Currently no evidence of upper GI bleeding, no indication for endoscopic evaluation at this time. Blood cultures were drawn, results pending.  Monitor off antibiotics. Replace magnesium, recheck magnesium and phosphorus in the morning labs. Continue half dose of long-acting insulin and sliding scale insulin for blood sugar control.   Total time spent: 30 minutes.  Same-day admit.  No charge visit.

## 2022-11-05 DIAGNOSIS — K920 Hematemesis: Secondary | ICD-10-CM

## 2022-11-05 DIAGNOSIS — R1084 Generalized abdominal pain: Secondary | ICD-10-CM

## 2022-11-05 DIAGNOSIS — R112 Nausea with vomiting, unspecified: Secondary | ICD-10-CM | POA: Diagnosis not present

## 2022-11-05 DIAGNOSIS — R9389 Abnormal findings on diagnostic imaging of other specified body structures: Secondary | ICD-10-CM

## 2022-11-05 HISTORY — DX: Hematemesis: K92.0

## 2022-11-05 HISTORY — DX: Abnormal findings on diagnostic imaging of other specified body structures: R93.89

## 2022-11-05 LAB — GLUCOSE, CAPILLARY
Glucose-Capillary: 145 mg/dL — ABNORMAL HIGH (ref 70–99)
Glucose-Capillary: 157 mg/dL — ABNORMAL HIGH (ref 70–99)
Glucose-Capillary: 169 mg/dL — ABNORMAL HIGH (ref 70–99)
Glucose-Capillary: 188 mg/dL — ABNORMAL HIGH (ref 70–99)
Glucose-Capillary: 188 mg/dL — ABNORMAL HIGH (ref 70–99)
Glucose-Capillary: 98 mg/dL (ref 70–99)

## 2022-11-05 LAB — CBC
HCT: 36.8 % (ref 36.0–46.0)
Hemoglobin: 12.3 g/dL (ref 12.0–15.0)
MCH: 29.1 pg (ref 26.0–34.0)
MCHC: 33.4 g/dL (ref 30.0–36.0)
MCV: 87 fL (ref 80.0–100.0)
Platelets: 242 10*3/uL (ref 150–400)
RBC: 4.23 MIL/uL (ref 3.87–5.11)
RDW: 14.1 % (ref 11.5–15.5)
WBC: 14.6 10*3/uL — ABNORMAL HIGH (ref 4.0–10.5)
nRBC: 0 % (ref 0.0–0.2)

## 2022-11-05 LAB — BASIC METABOLIC PANEL
Anion gap: 10 (ref 5–15)
BUN: 13 mg/dL (ref 6–20)
CO2: 25 mmol/L (ref 22–32)
Calcium: 8.6 mg/dL — ABNORMAL LOW (ref 8.9–10.3)
Chloride: 102 mmol/L (ref 98–111)
Creatinine, Ser: 1.14 mg/dL — ABNORMAL HIGH (ref 0.44–1.00)
GFR, Estimated: 56 mL/min — ABNORMAL LOW (ref >60)
Glucose, Bld: 90 mg/dL (ref 70–99)
Potassium: 3.3 mmol/L — ABNORMAL LOW (ref 3.5–5.1)
Sodium: 137 mmol/L (ref 135–145)

## 2022-11-05 LAB — PHOSPHORUS: Phosphorus: 2.1 mg/dL — ABNORMAL LOW (ref 2.5–4.6)

## 2022-11-05 LAB — MAGNESIUM: Magnesium: 1.7 mg/dL (ref 1.7–2.4)

## 2022-11-05 MED ORDER — METOCLOPRAMIDE HCL 5 MG/ML IJ SOLN: 10.0000 mg | INTRAMUSCULAR | Status: DC

## 2022-11-05 MED ORDER — MAGNESIUM SULFATE 2 GM/50ML IV SOLN
2.0000 g | Freq: Once | INTRAVENOUS | Status: AC
  Administered 2022-11-05: 2 g via INTRAVENOUS
  Filled 2022-11-05: qty 50

## 2022-11-05 MED ORDER — POTASSIUM CHLORIDE CRYS ER 20 MEQ PO TBCR: 20.0000 meq | EXTENDED_RELEASE_TABLET | Freq: Two times a day (BID) | ORAL | Status: DC

## 2022-11-05 MED ORDER — POTASSIUM PHOSPHATES 15 MMOLE/5ML IV SOLN
20.0000 mmol | Freq: Once | INTRAVENOUS | Status: AC
  Administered 2022-11-05: 20 mmol via INTRAVENOUS
  Filled 2022-11-05: qty 6.67

## 2022-11-05 NOTE — Progress Notes (Signed)
PROGRESS NOTE    [name]  ZOX:[id number] DOB: 1965-09-12 DOA: 11/03/2022 PCP: Esperanza Richters, PA-C    Brief Narrative:  57 year old with history of hypertension, hyperlipidemia, type 2 diabetes on insulin, CKD stage IIIa, history of peptic ulcer disease and anxiety presented with 3 days of abdominal pain, nausea and intractable vomiting without any aggravating factors. Unable to eat anything so came to the ER. in the emergency room was initially tachycardic, blood pressure 90. WBC 13.8, lactic acid 5.4. CT scan abdomen pelvis without significant findings except pyloric wall thickening. Patient with history of EGD and gastric emptying test that was normal in the past.     Assessment & Plan:   Intractable nausea vomiting, upper abdominal pain and abnormal CT scan: Suspect peptic ulcer disease.  Less likely gastroparesis with associated symptoms. Continue clears as tolerated, IV fluids, IV antiemetics.  With persistent symptoms, she will benefit with endoscopic evaluation.  Will consult GI.  Continue PPI.  AKI CKD stage IIIa: Baseline creatinine about 1.1.  On maintenance IV fluids.  Already improving.  Lactic acidosis tachycardia secondary to dehydration: No evidence of infection.  Improved with IV fluids.  Continue today.  Type 2 diabetes with hyperglycemia: Well-controlled today in the hospital.  At home patient takes metformin, Ozempic and 40 units of insulin at bedtime.  She is on half dose of insulin while NPO.  Hypertension: Blood pressure stable on amlodipine and carvedilol.  Hypokalemia Hypomagnesemia Hypophosphatemia Secondary to poor intake.  Replace aggressively today.   DVT prophylaxis: SCDs Start: 11/04/22 8119   Code Status: Full code Family Communication: None at the bedside Disposition Plan: Status is: Inpatient Remains inpatient appropriate because: Unable to tolerate oral intake     Consultants:  Gastroenterology  Procedures:   None  Antimicrobials:  None   Subjective: Patient seen and examined.  Still has nausea but slightly improved than yesterday.  She was still trying to have clear liquid diet.  She has vague epigastric pain mostly superficial likely secondary to muscle pain.  No bowel movement since admission.  Objective: Vitals:   11/05/22 0015 11/05/22 0438 11/05/22 0458 11/05/22 0755  BP: (!) 130/95 109/70  111/78  Pulse: 86 81  81  Resp: 18 18  17   Temp: 98 F (36.7 C) 97.9 F (36.6 C)  (!) 97.4 F (36.3 C)  TempSrc: Oral Oral  Oral  SpO2: 98% 97%  97%  Weight:   76.8 kg   Height:        Intake/Output Summary (Last 24 hours) at 11/05/2022 1112 Last data filed at 11/05/2022 0011 Gross per 24 hour  Intake 1371.67 ml  Output --  Net 1371.67 ml   Filed Weights   11/04/22 0218 11/04/22 0500 11/05/22 0458  Weight: 81.6 kg 79.7 kg 76.8 kg    Examination:  General exam: Appears calm and comfortable at rest.  Slightly anxious on exam. Respiratory system: No added sounds. Cardiovascular system: S1 & S2 heard, RRR. No pedal edema. Gastrointestinal system: Abdomen is nondistended, soft and mild diffuse tenderness along the epigastrium and left upper quadrant.  Normal bowel sounds heard. Central nervous system: Alert and oriented. No focal neurological deficits. Extremities: Symmetric 5 x 5 power.    Data Reviewed: I have personally reviewed following labs and imaging studies  CBC: Recent Labs  Lab 11/03/22 1925 11/04/22 0620 11/05/22 0817  WBC 13.8* 15.0* 14.6*  NEUTROABS 9.9*  --   --   HGB 13.7 13.2 12.3  HCT 41.2 39.1 36.8  MCV  84.4 86.3 87.0  PLT 320 271 242   Basic Metabolic Panel: Recent Labs  Lab 11/03/22 1925 11/04/22 0620 11/05/22 0817  NA 137 133* 137  K 4.9 3.6 3.3*  CL 101 97* 102  CO2 18* 24 25  GLUCOSE 259* 202* 90  BUN 33* 21* 13  CREATININE 1.56* 1.09* 1.14*  CALCIUM 9.5 8.9 8.6*  MG  --  1.4* 1.7  PHOS  --   --  2.1*   GFR: Estimated Creatinine  Clearance: 62.8 mL/min (A) (by C-G formula based on SCr of 1.14 mg/dL (H)). Liver Function Tests: Recent Labs  Lab 11/03/22 1925  AST 19  ALT 15  ALKPHOS 121  BILITOT 0.7  PROT 8.7*  ALBUMIN 3.5   No results for input(s): "LIPASE", "AMYLASE" in the last 168 hours. No results for input(s): "AMMONIA" in the last 168 hours. Coagulation Profile: Recent Labs  Lab 11/03/22 1925  INR 1.1   Cardiac Enzymes: No results for input(s): "CKTOTAL", "CKMB", "CKMBINDEX", "TROPONINI" in the last 168 hours. BNP (last 3 results) No results for input(s): "PROBNP" in the last 8760 hours. HbA1C: No results for input(s): "HGBA1C" in the last 72 hours. CBG: Recent Labs  Lab 11/04/22 2051 11/04/22 2135 11/05/22 0018 11/05/22 0444 11/05/22 0755  GLUCAP 154* 159* 145* 188* 98   Lipid Profile: No results for input(s): "CHOL", "HDL", "LDLCALC", "TRIG", "CHOLHDL", "LDLDIRECT" in the last 72 hours. Thyroid Function Tests: No results for input(s): "TSH", "T4TOTAL", "FREET4", "T3FREE", "THYROIDAB" in the last 72 hours. Anemia Panel: No results for input(s): "VITAMINB12", "FOLATE", "FERRITIN", "TIBC", "IRON", "RETICCTPCT" in the last 72 hours. Sepsis Labs: Recent Labs  Lab 11/03/22 1939 11/04/22 0029 11/04/22 0620  PROCALCITON  --   --  <0.10  LATICACIDVEN 5.4* 1.7  --     Recent Results (from the past 240 hour(s))  Culture, blood (single)     Status: None (Preliminary result)   Collection Time: 11/03/22  7:25 PM   Specimen: BLOOD  Result Value Ref Range Status   Specimen Description BLOOD SITE NOT SPECIFIED  Final   Special Requests   Final    BOTTLES DRAWN AEROBIC AND ANAEROBIC Blood Culture adequate volume   Culture   Final    NO GROWTH 2 DAYS Performed at [facility], [address] [place], [place] [address]    Report Status PENDING  Incomplete  Blood Culture (routine x 2)     Status: None (Preliminary result)   Collection Time: 11/03/22  7:28 PM   Specimen: BLOOD   Result Value Ref Range Status   Specimen Description BLOOD SITE NOT SPECIFIED  Final   Special Requests   Final    BOTTLES DRAWN AEROBIC AND ANAEROBIC Blood Culture adequate volume   Culture   Final    NO GROWTH 2 DAYS Performed at [facility], [address], [place], [place] [address]    Report Status PENDING  Incomplete  Resp panel by RT-PCR (RSV, Flu A&B, Covid) Anterior Nasal Swab     Status: None   Collection Time: 11/03/22  8:50 PM   Specimen: Anterior Nasal Swab  Result Value Ref Range Status   SARS Coronavirus 2 by RT PCR NEGATIVE NEGATIVE Final   Influenza A by PCR NEGATIVE NEGATIVE Final   Influenza B by PCR NEGATIVE NEGATIVE Final    Comment: (NOTE) The Xpert Xpress SARS-CoV-2/FLU/RSV plus assay is intended as an aid in the diagnosis of influenza from Nasopharyngeal swab specimens and should not be  used as a sole basis for treatment. Nasal washings and aspirates are unacceptable for Xpert Xpress SARS-CoV-2/FLU/RSV testing.  Fact Sheet for Patients: BloggerCourse.com  Fact Sheet for Healthcare Providers: SeriousBroker.it  This test is not yet approved or cleared by the [place] FDA and has been authorized for detection and/or diagnosis of SARS-CoV-2 by FDA under an Emergency Use Authorization (EUA). This EUA will remain in effect (meaning this test can be used) for the duration of the COVID-19 declaration under Section 564(b)(1) of the Act, 21 U.S.C. section 360bbb-3(b)(1), unless the authorization is terminated or revoked.     Resp Syncytial Virus by PCR NEGATIVE NEGATIVE Final    Comment: (NOTE) Fact Sheet for Patients: BloggerCourse.com  Fact Sheet for Healthcare Providers: SeriousBroker.it  This test is not yet approved or cleared by the [place] FDA and has been authorized for detection and/or diagnosis of SARS-CoV-2 by FDA under an  Emergency Use Authorization (EUA). This EUA will remain in effect (meaning this test can be used) for the duration of the COVID-19 declaration under Section 564(b)(1) of the Act, 21 U.S.C. section 360bbb-3(b)(1), unless the authorization is terminated or revoked.  Performed at [facility], [address], [place], [place] [address]          Radiology Studies: CT ABDOMEN PELVIS W CONTRAST  Result Date: 11/03/2022 CLINICAL DATA:  Emesis abdomen pain EXAM: CT ABDOMEN AND PELVIS WITH CONTRAST TECHNIQUE: Multidetector CT imaging of the abdomen and pelvis was performed using the standard protocol following bolus administration of intravenous contrast. RADIATION DOSE REDUCTION: This exam was performed according to the departmental dose-optimization program which includes automated exposure control, adjustment of the mA and/or kV according to patient size and/or use of iterative reconstruction technique. CONTRAST:  60mL OMNIPAQUE IOHEXOL 350 MG/ML SOLN COMPARISON:  Radiograph 11/03/2022, CT 07/28/2022 FINDINGS: Lower chest: Lung bases demonstrate no acute airspace disease. Hepatobiliary: No focal liver abnormality is seen. Status post cholecystectomy. No biliary dilatation. Pancreas: Unremarkable. No pancreatic ductal dilatation or surrounding inflammatory changes. Spleen: Normal in size without focal abnormality. Adrenals/Urinary Tract: Adrenal glands are unremarkable. Kidneys are normal, without renal calculi, focal lesion, or hydronephrosis. Bladder is unremarkable. Stomach/Bowel: Stomach nondistended. Possible mild pyloric wall thickening. No dilated small bowel. Negative appendix. Vascular/Lymphatic: Moderate aortic atherosclerosis. No aneurysm. No suspicious lymph nodes Reproductive: Hysterectomy. No adnexal mass small amount of air in the vagina. Chronic soft tissue fullness of the vagina. Other: Negative for pelvic effusion or free air Musculoskeletal: No acute or suspicious osseous  abnormality. Multilevel degenerative changes IMPRESSION: 1. Possible mild pyloric wall thickening, correlate for peptic ulcer disease/gastritis. 2. Otherwise no CT evidence for acute intra-abdominal or pelvic abnormality. 3. Aortic atherosclerosis. Aortic Atherosclerosis (ICD10-I70.0). Electronically Signed   By: Jasmine Pang M.D.   On: 11/03/2022 22:02   DG Abdomen 1 View  Result Date: 11/03/2022 CLINICAL DATA:  Abdomen pain EXAM: ABDOMEN - 1 VIEW COMPARISON:  09/13/2017 FINDINGS: Surgical clips in the right upper quadrant. Nonspecific paucity of bowel gas. Numerous clips in the pelvis. No radiopaque calculi IMPRESSION: Nonspecific paucity of bowel gas. Electronically Signed   By: Jasmine Pang M.D.   On: 11/03/2022 21:56   DG Chest Port 1 View  Result Date: 11/03/2022 CLINICAL DATA:  Possible sepsis chest pain EXAM: PORTABLE CHEST 1 VIEW COMPARISON:  07/28/2022 FINDINGS: The heart size and mediastinal contours are within normal limits. Both lungs are clear. The visualized skeletal structures are unremarkable. IMPRESSION: No active disease. Electronically Signed   By: Jasmine Pang  M.D.   On: 11/03/2022 21:55        Scheduled Meds:  amLODipine  5 mg Oral Daily   atorvastatin  80 mg Oral Daily   carvedilol  25 mg Oral BID WC   gabapentin  300 mg Oral TID   insulin aspart  0-6 Units Subcutaneous Q4H   insulin glargine-yfgn  10 Units Subcutaneous QHS   pantoprazole (PROTONIX) IV  40 mg Intravenous Q12H   sodium chloride flush  3 mL Intravenous Q12H   sucralfate  1 g Oral Q6H   Continuous Infusions:  lactated ringers 100 mL/hr at 11/05/22 0011   magnesium sulfate bolus IVPB     potassium PHOSPHATE IVPB (in mmol)     promethazine (PHENERGAN) injection (IM or IVPB) 12.5 mg (11/05/22 0910)     LOS: 1 day    Time spent: 35 minutes    Dorcas Carrow, MD [facility]

## 2022-11-05 NOTE — Consult Note (Signed)
Consultation Note   Referring Provider:  [facility] PCP: Marisue Brooklyn Primary Gastroenterologist: Previously Dr. Juanda Chance, then [facility] in 2019        Reason for Consultation: Nausea, vomiting, dyspepsia  DOA: 11/03/2022         Hospital Day: 3   ASSESSMENT    Brief Narrative:  57 y.o. year old female with DM2, gastroparesis, HTN, CKD 3, ? PUD  Chronic episodic nausea, vomiting, upper abdominal pain ( mainly LUQ). Episodes have been occurring for years. Suspect component of delayed gastric emptying, dyspepsia and possible Gastroduodenitis.  She describes black emesis yesterday so need to rule out PUD. LUQ pain could also be partly musculoskeletal since exacerbated by movement Hgb 12.3. CT scan showing possible mild pyloric wall thickening. She doesn't have her gallbladder. Started Ozempic 6 months ago and that can cause similar symptoms but not sure it is to blame since symptoms have been occurring for years. Additionally she has actually be doing relatively well for last few months ( on  Ozempic)  Colon cancer screening. Has never had any form of screening. No blood in stool. Not anemic  Anxiety, takes Buspar as needed  DM2. Hgb A1c in July was 8.8  AKI on CKD 3 Cr 1.14, GFR 56    PLAN:   --Recommend EGD . She hasn't had any solids for a couple of days which is helpful since there was food in stomach on EGD in 2019 ( despite normal gastric emptying study). The risks and benefits of EGD with possible biopsies were discussed with the patient who agrees to proceed. Can likely be done tomorrow --Clear liquids, NPO after MN --Continue BID PPI --Good glucose control will be helpful.  --Continue Carafate susp QID --prn anti-emetics --Try to minimize narcotics --If EGD negative and this appears to be dyspepsia then consider having her take Buspar on scheduled basis as it can help with dyspeptic symptoms.  --She needs  outpatient screening colonoscopy when able to tolerate bowel prep   HPI   Patient admitted 9/24 with nausea, vomiting and abdominal pain. She has gotten these episodes for years. Sometimes can go months in between episodes. Often gets sour belches before onset of symptoms. The pain is in LUQ with radiation through to the back.  The pain is stabbing.  It is worse with movement or deep breaths but can also occur at rest.  The pain is not made better or worse with eating..  When episodes occur, she generally also has loose stool.  These episodes of nausea, vomiting, upper abdominal pain and loose stool often persist for several days and then she winds up in the emergency department for treatment.  Yesterday she had black emesis.  She takes a daily baby aspirin, no other NSAIDs   In ED WBC and lactic acid were elevated.  BUN minimally elevated as was creatinine . Abnormal  pylorus on CT scan   ED / Admission workup notable for :  Glu 259 Cr 1.56 LFTs normal Lactic acid 5.4 >> 1.7 WBC 13.8 Hgb 13.2  CT AP w contrast IMPRESSION: 1. Possible mild pyloric wall thickening, correlate for peptic ulcer disease/gastritis. 2. Otherwise no CT evidence for acute intra-abdominal or pelvic abnormality. 3. Aortic atherosclerosis.  History of + UDS for cocaine and THC in July 2023. UDS not done this admission   Previous GI Evaluations   2019 EGD - [facility] Large amount of food in stomach.  Scheduled colonoscopy not done due to risk of aspiration with food in stomach  May 2019 Gastric emptying MPRESSION: Normal gastric emptying up to 3 hours, with minimally delayed emptying noted at 4 hours.    Labs and Imaging: Recent Labs    11/03/22 1925 11/04/22 0620 11/05/22 0817  WBC 13.8* 15.0* 14.6*  HGB 13.7 13.2 12.3  HCT 41.2 39.1 36.8  PLT 320 271 242   Recent Labs    11/03/22 1925 11/04/22 0620 11/05/22 0817  NA 137 133* 137  K 4.9 3.6 3.3*  CL 101 97* 102  CO2 18* 24 25   GLUCOSE 259* 202* 90  BUN 33* 21* 13  CREATININE 1.56* 1.09* 1.14*  CALCIUM 9.5 8.9 8.6*   Recent Labs    11/03/22 1925  PROT 8.7*  ALBUMIN 3.5  AST 19  ALT 15  ALKPHOS 121  BILITOT 0.7   No results for input(s): "HEPBSAG", "HCVAB", "HEPAIGM", "HEPBIGM" in the last 72 hours. Recent Labs    11/03/22 1925  LABPROT 14.7  INR 1.1      Past Medical History:  Diagnosis Date   Abnormal Pap smear of cervix    Anxiety 01/22/2016   Asthma    Cardiac murmur 02/01/2019   Cardiomyopathy, secondary (HCC) 09/06/2009   Qualifier: Diagnosis of  By: Jolene Provost   Formatting of this note might be different from the original. Overview:  Qualifier: Diagnosis of  By: Jolene Provost   Cervical cancer [facility]) 2001   Radical hysterectomy in [facility]   Chronic kidney disease, stage III (moderate) (HCC) 05/23/2014   Chronic pain syndrome 01/23/2016   Coronary artery disease    30% lesions noted   Depression    Diabetes type 2, uncontrolled 05/23/2014   Diabetic gastroparesis associated with type 1 diabetes mellitus (HCC) 11/27/2016   DIABETIC PERIPHERAL NEUROPATHY 09/21/2009   Qualifier: Diagnosis of  By: Delrae Alfred MD, [name]     Essential hypertension 08/30/2009   Qualifier: Diagnosis of  By: Cristela Felt, CNA, Christy     Gastroesophageal reflux disease 07/30/2009   Formatting of this note might be different from the original. Overview:  Overview:  Qualifier: Diagnosis of  By: Delrae Alfred MD, Alfonso Patten: Diagnosis of  By: Wilmon Pali NP, Consuela Mimes of this note might be different from the original. Overview:  Qualifier: Diagnosis of  By: Delrae Alfred MD, Alfonso Patten: Diagnosis of  By: Wilmon Pali NP, Gunnar Fusi   GERD (gastroesophageal reflux disease)    History of cervical cancer 08/17/2015   Status post partial hysterectomy  Formatting of this note might be different from the original. Status post partial hysterectomy   Hyperlipidemia    INSOMNIA  09/21/2009   Qualifier: Diagnosis of  By: Delrae Alfred MD, [name]     Lumbar facet arthropathy 05/14/2016   Lumbar spinal stenosis 02/11/2018   Metatarsalgia of both feet 03/11/2017   Migraine 09/13/2012   Overview:  IMPRESSION: possible abd migraine   TOBACCO ABUSE 09/21/2009   Qualifier: Diagnosis of  By: Delrae Alfred MD, Beverely Low of this note might be different from the original. Overview:  Qualifier: Diagnosis of  By: Delrae Alfred MD, [name]   Trigeminal neuralgia    ULCER-GASTRIC 09/28/2009   Qualifier: Diagnosis of  By: Wilmon Pali NP, Gunnar Fusi  VAGINITIS, CANDIDAL 12/25/2009   Qualifier: Diagnosis of  By: Delrae Alfred MD, Hubbard Robinson D deficiency 04/25/2013    Past Surgical History:  Procedure Laterality Date   ABDOMINAL HYSTERECTOMY     radical hysterectomy   AMPUTATION Left 01/06/2022   Procedure: AMPUTATION 2nd TOE;  Surgeon: Nadara Mustard, MD;  [facility];  Service: Orthopedics;  Laterality: Left;   CHOLECYSTECTOMY     I & D EXTREMITY Left 04/24/2021   Procedure: LEFT LEG DEBRIDEMENT;  Surgeon: Nadara Mustard, MD;  Location: [facility] OR;  Service: Orthopedics;  Laterality: Left;   I & D EXTREMITY Left 05/01/2021   Procedure: LEFT KNEE DEBRIDEMENT;  Surgeon: Nadara Mustard, MD;  Location: [facility] OR;  Service: Orthopedics;  Laterality: Left;    Family History  Problem Relation Age of Onset   Breast cancer Mother    Stroke Mother    Hypertension Mother    Diabetes Mother    Hypertension Father    Diabetes Father    Heart attack Father    Hypertension Sister    Diabetes Sister    Hypertension Brother    Diabetes Brother    Hypertension Brother    Diabetes Brother    Stroke Maternal Grandmother     Prior to Admission medications   Medication Sig Start Date End Date Taking? Authorizing Provider  albuterol (VENTOLIN HFA) 108 (90 Base) MCG/ACT inhaler Inhale 2 puffs into the lungs every 6 (six) hours as needed for wheezing or shortness of breath. Patient  taking differently: Inhale 2 puffs into the lungs 3 (three) times daily as needed for wheezing or shortness of breath. 08/03/20  Yes Saguier, Ramon Dredge, PA-C  amLODipine (NORVASC) 5 MG tablet Take 1 tablet (5 mg total) by mouth daily. 01/13/22 01/08/23 Yes Saguier, Ramon Dredge, PA-C  aspirin 81 MG tablet Take 81 mg by mouth daily.   Yes [provider]  atorvastatin (LIPITOR) 80 MG tablet Take 1 tablet (80 mg total) by mouth daily. 01/13/22  Yes Saguier, Ramon Dredge, PA-C  baclofen (LIORESAL) 10 MG tablet Take 1 tablet (10 mg total) by mouth 3 (three) times daily. 09/27/22  Yes Carlisle Beers, FNP  busPIRone (BUSPAR) 15 MG tablet Take 1 tablet (15 mg total) by mouth 2 (two) times daily. Patient taking differently: Take 15 mg by mouth daily as needed (anxiety). 01/13/22  Yes Saguier, Ramon Dredge, PA-C  carvedilol (COREG) 25 MG tablet Take 1 tablet (25 mg total) by mouth 2 (two) times daily with a meal. 06/18/22  Yes Saguier, Ramon Dredge, PA-C  fluticasone (FLONASE) 50 MCG/ACT nasal spray Place 2 sprays into both nostrils daily. 01/13/22  Yes Saguier, Ramon Dredge, PA-C  fluticasone furoate-vilanterol (BREO ELLIPTA) 100-25 MCG/INH AEPB Inhale 1 puff into the lungs daily. 08/03/20  Yes Saguier, Ramon Dredge, PA-C  gabapentin (NEURONTIN) 300 MG capsule TAKE 2 CAPSULES BY MOUTH THREE TIMES DAILY 08/12/22  Yes Saguier, Ramon Dredge, PA-C  Insulin Glargine [facility]) 100 UNIT/ML INJECT 40 UNITS SUBCUTANEOUSLY AT BEDTIME 08/12/22  Yes Saguier, Ramon Dredge, PA-C  metFORMIN (GLUCOPHAGE-XR) 500 MG 24 hr tablet Take 1 tablet (500 mg total) by mouth daily with breakfast. 01/22/22  Yes Saguier, Ramon Dredge, PA-C  nitroGLYCERIN (NITROSTAT) 0.4 MG SL tablet Place 1 tablet (0.4 mg total) under the tongue every 5 (five) minutes as needed for chest pain. 03/21/21  Yes Saguier, Ramon Dredge, PA-C  ondansetron (ZOFRAN) 4 MG tablet Take 4 mg by mouth every 8 (eight) hours as needed for nausea or vomiting.   Yes [provider]  Oxycodone HCl 10 MG TABS 1 tab po  bid prn severe pain. 08/20/22  Yes Saguier, Ramon Dredge, PA-C  OZEMPIC, 0.25 OR 0.5 MG/DOSE, 2 MG/3ML SOPN Inject 0.5 mg into the skin once a week. mondays 10/14/22  Yes [provider]  promethazine (PHENERGAN) 12.5 MG tablet TAKE 1 TABLET BY MOUTH EVERY 8 HOURS AS NEEDED FOR NAUSEA FOR VOMITING 08/12/22  Yes Saguier, Ramon Dredge, PA-C  topiramate (TOPAMAX) 50 MG tablet Take 1 tablet (50 mg total) by mouth 2 (two) times daily. Patient taking differently: Take 50 mg by mouth 2 (two) times daily as needed (headache). 01/13/22  Yes Saguier, Ramon Dredge, PA-C  VITAMIN D PO Take 5,000 Units by mouth daily.   Yes [provider]  Continuous Glucose Sensor (FREESTYLE LIBRE 3 SENSOR) MISC USE AS DIRECTED TO MONITOR BLOOD SUGAR. CHANGE EVERY 14 DAYS 08/07/22   [provider]  Insulin Pen Needle (PEN NEEDLES 31GX5/16") 31G X 8 MM MISC Use daily as directed 03/28/22   Saguier, Ramon Dredge, PA-C    Current Facility-Administered Medications  Medication Dose Route Frequency Provider Last Rate Last Admin   acetaminophen (TYLENOL) tablet 650 mg  650 mg Oral Q6H PRN Opyd, Lavone Neri, MD       Or   acetaminophen (TYLENOL) suppository 650 mg  650 mg Rectal Q6H PRN Opyd, Lavone Neri, MD       albuterol (PROVENTIL) (2.5 MG/3ML) 0.083% nebulizer solution 2.5 mg  2.5 mg Inhalation Q6H PRN Opyd, Lavone Neri, MD       amLODipine (NORVASC) tablet 5 mg  5 mg Oral Daily Opyd, Lavone Neri, MD   5 mg at 11/05/22 0912   atorvastatin (LIPITOR) tablet 80 mg  80 mg Oral Daily Opyd, Lavone Neri, MD   80 mg at 11/05/22 0913   carvedilol (COREG) tablet 25 mg  25 mg Oral BID WC Opyd, Lavone Neri, MD   25 mg at 11/05/22 0913   gabapentin (NEURONTIN) capsule 300 mg  300 mg Oral TID Briscoe Deutscher, MD   300 mg at 11/05/22 0913   HYDROmorphone (DILAUDID) injection 0.5 mg  0.5 mg Intravenous Q4H PRN Opyd, Lavone Neri, MD   0.5 mg at 11/05/22 0814   insulin aspart (novoLOG) injection 0-6 Units  0-6 Units Subcutaneous Q4H Opyd, Lavone Neri, MD   1  Units at 11/05/22 0537   insulin glargine-yfgn (SEMGLEE) injection 10 Units  10 Units Subcutaneous QHS Briscoe Deutscher, MD   10 Units at 11/04/22 2216   lactated ringers infusion   Intravenous Continuous Dorcas Carrow, MD 100 mL/hr at 11/05/22 0011 Infusion Verify at 11/05/22 0011   magnesium sulfate IVPB 2 g 50 mL  2 g Intravenous Once Dorcas Carrow, MD       oxyCODONE (Oxy IR/ROXICODONE) immediate release tablet 5 mg  5 mg Oral Q4H PRN Opyd, Lavone Neri, MD   5 mg at 11/04/22 2229   pantoprazole (PROTONIX) injection 40 mg  40 mg Intravenous Q12H Opyd, Lavone Neri, MD   40 mg at 11/05/22 0814   potassium PHOSPHATE 20 mmol in dextrose 5 % 500 mL infusion  20 mmol Intravenous Once Dorcas Carrow, MD       promethazine (PHENERGAN) 12.5 mg in sodium chloride 0.9 % 50 mL IVPB  12.5 mg Intravenous Q6H PRN Dorcas Carrow, MD 200 mL/hr at 11/05/22 0910 12.5 mg at 11/05/22 0910   senna-docusate (Senokot-S) tablet 1 tablet  1 tablet Oral QHS PRN Opyd, Lavone Neri, MD       sodium  chloride flush (NS) 0.9 % injection 3 mL  3 mL Intravenous Q12H Opyd, Lavone Neri, MD   3 mL at 11/05/22 0942   sucralfate (CARAFATE) 1 GM/10ML suspension 1 g  1 g Oral Q6H Opyd, Lavone Neri, MD   1 g at 11/05/22 0538    Allergies as of 11/03/2022 - Review Complete 11/03/2022  Allergen Reaction Noted   Lisinopril Swelling 04/24/2022   Oxycodone-acetaminophen Nausea Only 01/05/2022   Latex Itching, Swelling, and Rash 07/30/2009   Morphine Itching and Rash 07/30/2009    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  Tobacco Use   Smoking status: Former    Current packs/day: 0.00    Types: Cigarettes    Quit date: 1990    Years since quitting: 34.7   Smokeless tobacco: Never  Vaping Use   Vaping status: Never Used   Substance and Sexual Activity   Alcohol use: Yes    Comment: occasionally   Drug use: No   Sexual activity: Yes    Partners: Male    Birth control/protection: Surgical    Comment: hysterectomy  Other Topics Concern   Not on file  Social History Narrative   Not on file   Social Determinants of Health   Financial Resource Strain: Not on file  Food Insecurity: Food Insecurity Present (08/02/2022)   Hunger Vital Sign    Worried About Running Out of Food in the Last Year: Sometimes true    Ran Out of Food in the Last Year: Sometimes true  Transportation Needs: No Transportation Needs (08/02/2022)   PRAPARE - Administrator, Civil Service (Medical): No    Lack of Transportation (Non-Medical): No  Physical Activity: Not on file  Stress: Not on file  Social Connections: Unknown (06/23/2021)   Received from [facility]   Social Network    Social Network: Not on file  Intimate Partner Violence: Not At Risk (08/02/2022)   Humiliation, Afraid, Rape, and Kick questionnaire    Fear of Current or Ex-Partner: No    Emotionally Abused: No    Physically Abused: No    Sexually Abused: No     Code Status   Code Status: Full Code  Review of Systems: Weight loss on Ozempic. All systems reviewed and negative except where noted in HPI.  Physical Exam: Vital signs in last 24 hours: Temp:  [97.4 F (36.3 C)-98.2 F (36.8 C)] 97.4 F (36.3 C) (09/25 0755) Pulse Rate:  [81-99] 81 (09/25 0755) Resp:  [16-18] 17 (09/25 0755) BP: (109-136)/(70-96) 111/78 (09/25 0755) SpO2:  [97 %-100 %] 97 % (09/25 0755) Weight:  [76.8 kg] 76.8 kg (09/25 0458)    General:  Pleasant female in NAD Psych:  Cooperative. Normal mood and affect Eyes: Pupils equal Ears:  Normal auditory acuity Nose: No deformity, discharge or lesions Neck:  Supple, no masses felt Lungs:  Clear to auscultation.  Heart:  Regular rate, regular rhythm.  Abdomen:  Soft, nondistended, moderate LUQ  tenderness, active bowel sounds, no masses felt Rectal :  Deferred Msk: Symmetrical without gross deformities.  Neurologic:  Alert, oriented, grossly normal neurologically Extremities : No edema Skin:  Intact without significant lesions.    Intake/Output from previous day: 09/24 0701 -  09/25 0700 In: 1371.7 [I.V.:1321.7; IV Piggyback:50] Out: -  Intake/Output this shift:  No intake/output data recorded.  Principal Problem:   Acute renal failure superimposed on stage 3a chronic kidney disease (HCC) Active Problems:   PUD (peptic ulcer disease)   Abdominal pain with vomiting   Anxiety   Hyperlipidemia   Hypertension   Uncontrolled type 2 diabetes mellitus with hyperglycemia, without long-term current use of insulin (HCC)   SIRS (systemic inflammatory response syndrome) (HCC)    Willette Cluster, NP-C   11/05/2022, 11:13 AM

## 2022-11-05 NOTE — H&P (View-Only) (Signed)
Consultation Note   Referring Provider:  [facility] PCP: Marisue Brooklyn Primary Gastroenterologist: Previously Dr. Juanda Chance, then [facility] in 2019        Reason for Consultation: Nausea, vomiting, dyspepsia  DOA: 11/03/2022         Hospital Day: 3   ASSESSMENT    Brief Narrative:  57 y.o. year old female with DM2, gastroparesis, HTN, CKD 3, ? PUD  Chronic episodic nausea, vomiting, upper abdominal pain ( mainly LUQ). Episodes have been occurring for years. Suspect component of delayed gastric emptying, dyspepsia and possible Gastroduodenitis.  She describes black emesis yesterday so need to rule out PUD. LUQ pain could also be partly musculoskeletal since exacerbated by movement Hgb 12.3. CT scan showing possible mild pyloric wall thickening. She doesn't have her gallbladder. Started Ozempic 6 months ago and that can cause similar symptoms but not sure it is to blame since symptoms have been occurring for years. Additionally she has actually be doing relatively well for last few months ( on  Ozempic)  Colon cancer screening. Has never had any form of screening. No blood in stool. Not anemic  Anxiety, takes Buspar as needed  DM2. Hgb A1c in July was 8.8  AKI on CKD 3 Cr 1.14, GFR 56    PLAN:   --Recommend EGD . She hasn't had any solids for a couple of days which is helpful since there was food in stomach on EGD in 2019 ( despite normal gastric emptying study). The risks and benefits of EGD with possible biopsies were discussed with the patient who agrees to proceed. Can likely be done tomorrow --Clear liquids, NPO after MN --Continue BID PPI --Good glucose control will be helpful.  --Continue Carafate susp QID --prn anti-emetics --Try to minimize narcotics --If EGD negative and this appears to be dyspepsia then consider having her take Buspar on scheduled basis as it can help with dyspeptic symptoms.  --She needs  outpatient screening colonoscopy when able to tolerate bowel prep   HPI   Patient admitted 9/24 with nausea, vomiting and abdominal pain. She has gotten these episodes for years. Sometimes can go months in between episodes. Often gets sour belches before onset of symptoms. The pain is in LUQ with radiation through to the back.  The pain is stabbing.  It is worse with movement or deep breaths but can also occur at rest.  The pain is not made better or worse with eating..  When episodes occur, she generally also has loose stool.  These episodes of nausea, vomiting, upper abdominal pain and loose stool often persist for several days and then she winds up in the emergency department for treatment.  Yesterday she had black emesis.  She takes a daily baby aspirin, no other NSAIDs   In ED WBC and lactic acid were elevated.  BUN minimally elevated as was creatinine . Abnormal  pylorus on CT scan   ED / Admission workup notable for :  Glu 259 Cr 1.56 LFTs normal Lactic acid 5.4 >> 1.7 WBC 13.8 Hgb 13.2  CT AP w contrast IMPRESSION: 1. Possible mild pyloric wall thickening, correlate for peptic ulcer disease/gastritis. 2. Otherwise no CT evidence for acute intra-abdominal or pelvic abnormality. 3. Aortic atherosclerosis.  History of + UDS for cocaine and THC in July 2023. UDS not done this admission   Previous GI Evaluations   2019 EGD - [facility] Large amount of food in stomach.  Scheduled colonoscopy not done due to risk of aspiration with food in stomach  May 2019 Gastric emptying MPRESSION: Normal gastric emptying up to 3 hours, with minimally delayed emptying noted at 4 hours.    Labs and Imaging: Recent Labs    11/03/22 1925 11/04/22 0620 11/05/22 0817  WBC 13.8* 15.0* 14.6*  HGB 13.7 13.2 12.3  HCT 41.2 39.1 36.8  PLT 320 271 242   Recent Labs    11/03/22 1925 11/04/22 0620 11/05/22 0817  NA 137 133* 137  K 4.9 3.6 3.3*  CL 101 97* 102  CO2 18* 24 25   GLUCOSE 259* 202* 90  BUN 33* 21* 13  CREATININE 1.56* 1.09* 1.14*  CALCIUM 9.5 8.9 8.6*   Recent Labs    11/03/22 1925  PROT 8.7*  ALBUMIN 3.5  AST 19  ALT 15  ALKPHOS 121  BILITOT 0.7   No results for input(s): "HEPBSAG", "HCVAB", "HEPAIGM", "HEPBIGM" in the last 72 hours. Recent Labs    11/03/22 1925  LABPROT 14.7  INR 1.1      Past Medical History:  Diagnosis Date   Abnormal Pap smear of cervix    Anxiety 01/22/2016   Asthma    Cardiac murmur 02/01/2019   Cardiomyopathy, secondary (HCC) 09/06/2009   Qualifier: Diagnosis of  By: Jolene Provost   Formatting of this note might be different from the original. Overview:  Qualifier: Diagnosis of  By: Jolene Provost   Cervical cancer [facility]) 2001   Radical hysterectomy in [facility]   Chronic kidney disease, stage III (moderate) (HCC) 05/23/2014   Chronic pain syndrome 01/23/2016   Coronary artery disease    30% lesions noted   Depression    Diabetes type 2, uncontrolled 05/23/2014   Diabetic gastroparesis associated with type 1 diabetes mellitus (HCC) 11/27/2016   DIABETIC PERIPHERAL NEUROPATHY 09/21/2009   Qualifier: Diagnosis of  By: Delrae Alfred MD, [name]     Essential hypertension 08/30/2009   Qualifier: Diagnosis of  By: Cristela Felt, CNA, Christy     Gastroesophageal reflux disease 07/30/2009   Formatting of this note might be different from the original. Overview:  Overview:  Qualifier: Diagnosis of  By: Delrae Alfred MD, Alfonso Patten: Diagnosis of  By: Wilmon Pali NP, Consuela Mimes of this note might be different from the original. Overview:  Qualifier: Diagnosis of  By: Delrae Alfred MD, Alfonso Patten: Diagnosis of  By: Wilmon Pali NP, Gunnar Fusi   GERD (gastroesophageal reflux disease)    History of cervical cancer 08/17/2015   Status post partial hysterectomy  Formatting of this note might be different from the original. Status post partial hysterectomy   Hyperlipidemia    INSOMNIA  09/21/2009   Qualifier: Diagnosis of  By: Delrae Alfred MD, [name]     Lumbar facet arthropathy 05/14/2016   Lumbar spinal stenosis 02/11/2018   Metatarsalgia of both feet 03/11/2017   Migraine 09/13/2012   Overview:  IMPRESSION: possible abd migraine   TOBACCO ABUSE 09/21/2009   Qualifier: Diagnosis of  By: Delrae Alfred MD, Beverely Low of this note might be different from the original. Overview:  Qualifier: Diagnosis of  By: Delrae Alfred MD, [name]   Trigeminal neuralgia    ULCER-GASTRIC 09/28/2009   Qualifier: Diagnosis of  By: Wilmon Pali NP, Gunnar Fusi  VAGINITIS, CANDIDAL 12/25/2009   Qualifier: Diagnosis of  By: Delrae Alfred MD, Hubbard Robinson D deficiency 04/25/2013    Past Surgical History:  Procedure Laterality Date   ABDOMINAL HYSTERECTOMY     radical hysterectomy   AMPUTATION Left 01/06/2022   Procedure: AMPUTATION 2nd TOE;  Surgeon: Nadara Mustard, MD;  [facility];  Service: Orthopedics;  Laterality: Left;   CHOLECYSTECTOMY     I & D EXTREMITY Left 04/24/2021   Procedure: LEFT LEG DEBRIDEMENT;  Surgeon: Nadara Mustard, MD;  Location: [facility] OR;  Service: Orthopedics;  Laterality: Left;   I & D EXTREMITY Left 05/01/2021   Procedure: LEFT KNEE DEBRIDEMENT;  Surgeon: Nadara Mustard, MD;  Location: [facility] OR;  Service: Orthopedics;  Laterality: Left;    Family History  Problem Relation Age of Onset   Breast cancer Mother    Stroke Mother    Hypertension Mother    Diabetes Mother    Hypertension Father    Diabetes Father    Heart attack Father    Hypertension Sister    Diabetes Sister    Hypertension Brother    Diabetes Brother    Hypertension Brother    Diabetes Brother    Stroke Maternal Grandmother     Prior to Admission medications   Medication Sig Start Date End Date Taking? Authorizing Provider  albuterol (VENTOLIN HFA) 108 (90 Base) MCG/ACT inhaler Inhale 2 puffs into the lungs every 6 (six) hours as needed for wheezing or shortness of breath. Patient  taking differently: Inhale 2 puffs into the lungs 3 (three) times daily as needed for wheezing or shortness of breath. 08/03/20  Yes Saguier, Ramon Dredge, PA-C  amLODipine (NORVASC) 5 MG tablet Take 1 tablet (5 mg total) by mouth daily. 01/13/22 01/08/23 Yes Saguier, Ramon Dredge, PA-C  aspirin 81 MG tablet Take 81 mg by mouth daily.   Yes [provider]  atorvastatin (LIPITOR) 80 MG tablet Take 1 tablet (80 mg total) by mouth daily. 01/13/22  Yes Saguier, Ramon Dredge, PA-C  baclofen (LIORESAL) 10 MG tablet Take 1 tablet (10 mg total) by mouth 3 (three) times daily. 09/27/22  Yes Carlisle Beers, FNP  busPIRone (BUSPAR) 15 MG tablet Take 1 tablet (15 mg total) by mouth 2 (two) times daily. Patient taking differently: Take 15 mg by mouth daily as needed (anxiety). 01/13/22  Yes Saguier, Ramon Dredge, PA-C  carvedilol (COREG) 25 MG tablet Take 1 tablet (25 mg total) by mouth 2 (two) times daily with a meal. 06/18/22  Yes Saguier, Ramon Dredge, PA-C  fluticasone (FLONASE) 50 MCG/ACT nasal spray Place 2 sprays into both nostrils daily. 01/13/22  Yes Saguier, Ramon Dredge, PA-C  fluticasone furoate-vilanterol (BREO ELLIPTA) 100-25 MCG/INH AEPB Inhale 1 puff into the lungs daily. 08/03/20  Yes Saguier, Ramon Dredge, PA-C  gabapentin (NEURONTIN) 300 MG capsule TAKE 2 CAPSULES BY MOUTH THREE TIMES DAILY 08/12/22  Yes Saguier, Ramon Dredge, PA-C  Insulin Glargine [facility]) 100 UNIT/ML INJECT 40 UNITS SUBCUTANEOUSLY AT BEDTIME 08/12/22  Yes Saguier, Ramon Dredge, PA-C  metFORMIN (GLUCOPHAGE-XR) 500 MG 24 hr tablet Take 1 tablet (500 mg total) by mouth daily with breakfast. 01/22/22  Yes Saguier, Ramon Dredge, PA-C  nitroGLYCERIN (NITROSTAT) 0.4 MG SL tablet Place 1 tablet (0.4 mg total) under the tongue every 5 (five) minutes as needed for chest pain. 03/21/21  Yes Saguier, Ramon Dredge, PA-C  ondansetron (ZOFRAN) 4 MG tablet Take 4 mg by mouth every 8 (eight) hours as needed for nausea or vomiting.   Yes [provider]  Oxycodone HCl 10 MG TABS 1 tab po  bid prn severe pain. 08/20/22  Yes Saguier, Ramon Dredge, PA-C  OZEMPIC, 0.25 OR 0.5 MG/DOSE, 2 MG/3ML SOPN Inject 0.5 mg into the skin once a week. mondays 10/14/22  Yes [provider]  promethazine (PHENERGAN) 12.5 MG tablet TAKE 1 TABLET BY MOUTH EVERY 8 HOURS AS NEEDED FOR NAUSEA FOR VOMITING 08/12/22  Yes Saguier, Ramon Dredge, PA-C  topiramate (TOPAMAX) 50 MG tablet Take 1 tablet (50 mg total) by mouth 2 (two) times daily. Patient taking differently: Take 50 mg by mouth 2 (two) times daily as needed (headache). 01/13/22  Yes Saguier, Ramon Dredge, PA-C  VITAMIN D PO Take 5,000 Units by mouth daily.   Yes [provider]  Continuous Glucose Sensor (FREESTYLE LIBRE 3 SENSOR) MISC USE AS DIRECTED TO MONITOR BLOOD SUGAR. CHANGE EVERY 14 DAYS 08/07/22   [provider]  Insulin Pen Needle (PEN NEEDLES 31GX5/16") 31G X 8 MM MISC Use daily as directed 03/28/22   Saguier, Ramon Dredge, PA-C    Current Facility-Administered Medications  Medication Dose Route Frequency Provider Last Rate Last Admin   acetaminophen (TYLENOL) tablet 650 mg  650 mg Oral Q6H PRN Opyd, Lavone Neri, MD       Or   acetaminophen (TYLENOL) suppository 650 mg  650 mg Rectal Q6H PRN Opyd, Lavone Neri, MD       albuterol (PROVENTIL) (2.5 MG/3ML) 0.083% nebulizer solution 2.5 mg  2.5 mg Inhalation Q6H PRN Opyd, Lavone Neri, MD       amLODipine (NORVASC) tablet 5 mg  5 mg Oral Daily Opyd, Lavone Neri, MD   5 mg at 11/05/22 0912   atorvastatin (LIPITOR) tablet 80 mg  80 mg Oral Daily Opyd, Lavone Neri, MD   80 mg at 11/05/22 0913   carvedilol (COREG) tablet 25 mg  25 mg Oral BID WC Opyd, Lavone Neri, MD   25 mg at 11/05/22 0913   gabapentin (NEURONTIN) capsule 300 mg  300 mg Oral TID Briscoe Deutscher, MD   300 mg at 11/05/22 0913   HYDROmorphone (DILAUDID) injection 0.5 mg  0.5 mg Intravenous Q4H PRN Opyd, Lavone Neri, MD   0.5 mg at 11/05/22 0814   insulin aspart (novoLOG) injection 0-6 Units  0-6 Units Subcutaneous Q4H Opyd, Lavone Neri, MD   1  Units at 11/05/22 0537   insulin glargine-yfgn (SEMGLEE) injection 10 Units  10 Units Subcutaneous QHS Briscoe Deutscher, MD   10 Units at 11/04/22 2216   lactated ringers infusion   Intravenous Continuous Dorcas Carrow, MD 100 mL/hr at 11/05/22 0011 Infusion Verify at 11/05/22 0011   magnesium sulfate IVPB 2 g 50 mL  2 g Intravenous Once Dorcas Carrow, MD       oxyCODONE (Oxy IR/ROXICODONE) immediate release tablet 5 mg  5 mg Oral Q4H PRN Opyd, Lavone Neri, MD   5 mg at 11/04/22 2229   pantoprazole (PROTONIX) injection 40 mg  40 mg Intravenous Q12H Opyd, Lavone Neri, MD   40 mg at 11/05/22 0814   potassium PHOSPHATE 20 mmol in dextrose 5 % 500 mL infusion  20 mmol Intravenous Once Dorcas Carrow, MD       promethazine (PHENERGAN) 12.5 mg in sodium chloride 0.9 % 50 mL IVPB  12.5 mg Intravenous Q6H PRN Dorcas Carrow, MD 200 mL/hr at 11/05/22 0910 12.5 mg at 11/05/22 0910   senna-docusate (Senokot-S) tablet 1 tablet  1 tablet Oral QHS PRN Opyd, Lavone Neri, MD       sodium  chloride flush (NS) 0.9 % injection 3 mL  3 mL Intravenous Q12H Opyd, Lavone Neri, MD   3 mL at 11/05/22 0942   sucralfate (CARAFATE) 1 GM/10ML suspension 1 g  1 g Oral Q6H Opyd, Lavone Neri, MD   1 g at 11/05/22 0538    Allergies as of 11/03/2022 - Review Complete 11/03/2022  Allergen Reaction Noted   Lisinopril Swelling 04/24/2022   Oxycodone-acetaminophen Nausea Only 01/05/2022   Latex Itching, Swelling, and Rash 07/30/2009   Morphine Itching and Rash 07/30/2009    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  Tobacco Use   Smoking status: Former    Current packs/day: 0.00    Types: Cigarettes    Quit date: 1990    Years since quitting: 34.7   Smokeless tobacco: Never  Vaping Use   Vaping status: Never Used   Substance and Sexual Activity   Alcohol use: Yes    Comment: occasionally   Drug use: No   Sexual activity: Yes    Partners: Male    Birth control/protection: Surgical    Comment: hysterectomy  Other Topics Concern   Not on file  Social History Narrative   Not on file   Social Determinants of Health   Financial Resource Strain: Not on file  Food Insecurity: Food Insecurity Present (08/02/2022)   Hunger Vital Sign    Worried About Running Out of Food in the Last Year: Sometimes true    Ran Out of Food in the Last Year: Sometimes true  Transportation Needs: No Transportation Needs (08/02/2022)   PRAPARE - Administrator, Civil Service (Medical): No    Lack of Transportation (Non-Medical): No  Physical Activity: Not on file  Stress: Not on file  Social Connections: Unknown (06/23/2021)   Received from [facility]   Social Network    Social Network: Not on file  Intimate Partner Violence: Not At Risk (08/02/2022)   Humiliation, Afraid, Rape, and Kick questionnaire    Fear of Current or Ex-Partner: No    Emotionally Abused: No    Physically Abused: No    Sexually Abused: No     Code Status   Code Status: Full Code  Review of Systems: Weight loss on Ozempic. All systems reviewed and negative except where noted in HPI.  Physical Exam: Vital signs in last 24 hours: Temp:  [97.4 F (36.3 C)-98.2 F (36.8 C)] 97.4 F (36.3 C) (09/25 0755) Pulse Rate:  [81-99] 81 (09/25 0755) Resp:  [16-18] 17 (09/25 0755) BP: (109-136)/(70-96) 111/78 (09/25 0755) SpO2:  [97 %-100 %] 97 % (09/25 0755) Weight:  [76.8 kg] 76.8 kg (09/25 0458)    General:  Pleasant female in NAD Psych:  Cooperative. Normal mood and affect Eyes: Pupils equal Ears:  Normal auditory acuity Nose: No deformity, discharge or lesions Neck:  Supple, no masses felt Lungs:  Clear to auscultation.  Heart:  Regular rate, regular rhythm.  Abdomen:  Soft, nondistended, moderate LUQ  tenderness, active bowel sounds, no masses felt Rectal :  Deferred Msk: Symmetrical without gross deformities.  Neurologic:  Alert, oriented, grossly normal neurologically Extremities : No edema Skin:  Intact without significant lesions.    Intake/Output from previous day: 09/24 0701 -  09/25 0700 In: 1371.7 [I.V.:1321.7; IV Piggyback:50] Out: -  Intake/Output this shift:  No intake/output data recorded.  Principal Problem:   Acute renal failure superimposed on stage 3a chronic kidney disease (HCC) Active Problems:   PUD (peptic ulcer disease)   Abdominal pain with vomiting   Anxiety   Hyperlipidemia   Hypertension   Uncontrolled type 2 diabetes mellitus with hyperglycemia, without long-term current use of insulin (HCC)   SIRS (systemic inflammatory response syndrome) (HCC)    Willette Cluster, NP-C   11/05/2022, 11:13 AM

## 2022-11-06 ENCOUNTER — Encounter (HOSPITAL_COMMUNITY): Payer: Self-pay | Admitting: Family Medicine

## 2022-11-06 ENCOUNTER — Inpatient Hospital Stay (HOSPITAL_COMMUNITY): Payer: Commercial Managed Care - HMO | Admitting: Anesthesiology

## 2022-11-06 ENCOUNTER — Encounter (HOSPITAL_COMMUNITY)
Admission: EM | Disposition: A | Discharge status: Home / Self Care | Payer: Self-pay | Source: Home / Self Care | Attending: Internal Medicine

## 2022-11-06 DIAGNOSIS — K2289 Other specified disease of esophagus: Secondary | ICD-10-CM

## 2022-11-06 DIAGNOSIS — K2970 Gastritis, unspecified, without bleeding: Secondary | ICD-10-CM

## 2022-11-06 DIAGNOSIS — I2510 Atherosclerotic heart disease of native coronary artery without angina pectoris: Secondary | ICD-10-CM

## 2022-11-06 DIAGNOSIS — R112 Nausea with vomiting, unspecified: Secondary | ICD-10-CM | POA: Diagnosis not present

## 2022-11-06 HISTORY — PX: ESOPHAGOGASTRODUODENOSCOPY (EGD) WITH PROPOFOL: SHX5813

## 2022-11-06 HISTORY — PX: BIOPSY: SHX5522

## 2022-11-06 LAB — CULTURE, BLOOD (SINGLE): Special Requests: ADEQUATE

## 2022-11-06 LAB — BASIC METABOLIC PANEL
Anion gap: 6 (ref 5–15)
BUN: 7 mg/dL (ref 6–20)
CO2: 28 mmol/L (ref 22–32)
Calcium: 8.3 mg/dL — ABNORMAL LOW (ref 8.9–10.3)
Chloride: 105 mmol/L (ref 98–111)
Creatinine, Ser: 1.13 mg/dL — ABNORMAL HIGH (ref 0.44–1.00)
GFR, Estimated: 57 mL/min — ABNORMAL LOW (ref >60)
Glucose, Bld: 125 mg/dL — ABNORMAL HIGH (ref 70–99)
Potassium: 3.8 mmol/L (ref 3.5–5.1)
Sodium: 139 mmol/L (ref 135–145)

## 2022-11-06 LAB — CBC
HCT: 35.2 % — ABNORMAL LOW (ref 36.0–46.0)
Hemoglobin: 11.8 g/dL — ABNORMAL LOW (ref 12.0–15.0)
MCH: 29.6 pg (ref 26.0–34.0)
MCHC: 33.5 g/dL (ref 30.0–36.0)
MCV: 88.4 fL (ref 80.0–100.0)
Platelets: 230 10*3/uL (ref 150–400)
RBC: 3.98 MIL/uL (ref 3.87–5.11)
RDW: 14.1 % (ref 11.5–15.5)
WBC: 11.3 10*3/uL — ABNORMAL HIGH (ref 4.0–10.5)
nRBC: 0 % (ref 0.0–0.2)

## 2022-11-06 LAB — GLUCOSE, CAPILLARY
Glucose-Capillary: 141 mg/dL — ABNORMAL HIGH (ref 70–99)
Glucose-Capillary: 143 mg/dL — ABNORMAL HIGH (ref 70–99)
Glucose-Capillary: 145 mg/dL — ABNORMAL HIGH (ref 70–99)
Glucose-Capillary: 167 mg/dL — ABNORMAL HIGH (ref 70–99)
Glucose-Capillary: 174 mg/dL — ABNORMAL HIGH (ref 70–99)
Glucose-Capillary: 178 mg/dL — ABNORMAL HIGH (ref 70–99)
Glucose-Capillary: 232 mg/dL — ABNORMAL HIGH (ref 70–99)

## 2022-11-06 LAB — CULTURE, BLOOD (ROUTINE X 2): Special Requests: ADEQUATE

## 2022-11-06 LAB — TSH: TSH: 1.416 u[IU]/mL (ref 0.350–4.500)

## 2022-11-06 SURGERY — ESOPHAGOGASTRODUODENOSCOPY (EGD) WITH PROPOFOL
Anesthesia: Monitor Anesthesia Care

## 2022-11-06 MED ORDER — LIDOCAINE 2% (20 MG/ML) 5 ML SYRINGE
INTRAMUSCULAR | Status: DC | PRN
  Administered 2022-11-06: 20 mg via INTRAVENOUS

## 2022-11-06 MED ORDER — METOCLOPRAMIDE HCL 5 MG PO TABS
5.0000 mg | ORAL_TABLET | Freq: Three times a day (TID) | ORAL | Status: DC
  Administered 2022-11-06 – 2022-11-08 (×7): 5 mg via ORAL
  Filled 2022-11-06 (×7): qty 1

## 2022-11-06 MED ORDER — LACTATED RINGERS IV SOLN
INTRAVENOUS | Status: AC | PRN
  Administered 2022-11-06: 1000 mL via INTRAVENOUS

## 2022-11-06 MED ORDER — SODIUM CHLORIDE 0.9 % IV SOLN: INTRAVENOUS | Status: DC

## 2022-11-06 MED ORDER — LIDOCAINE 5 % EX PTCH
1.0000 | MEDICATED_PATCH | CUTANEOUS | Status: DC
  Administered 2022-11-06 – 2022-11-08 (×3): 1 via TRANSDERMAL
  Filled 2022-11-06 (×3): qty 1

## 2022-11-06 MED ORDER — FENTANYL CITRATE (PF) 100 MCG/2ML IJ SOLN
INTRAMUSCULAR | Status: DC | PRN
  Administered 2022-11-06: 50 ug via INTRAVENOUS

## 2022-11-06 MED ORDER — PROPOFOL 500 MG/50ML IV EMUL
INTRAVENOUS | Status: DC | PRN
  Administered 2022-11-06: 125 ug/kg/min via INTRAVENOUS

## 2022-11-06 MED ORDER — PANTOPRAZOLE SODIUM 40 MG PO TBEC
40.0000 mg | DELAYED_RELEASE_TABLET | Freq: Two times a day (BID) | ORAL | Status: DC
  Administered 2022-11-06 – 2022-11-08 (×4): 40 mg via ORAL
  Filled 2022-11-06 (×5): qty 1

## 2022-11-06 MED ORDER — FENTANYL CITRATE (PF) 100 MCG/2ML IJ SOLN
INTRAMUSCULAR | Status: AC
  Filled 2022-11-06: qty 2

## 2022-11-06 MED ORDER — PROPOFOL 10 MG/ML IV BOLUS
INTRAVENOUS | Status: DC | PRN
Start: 2022-11-06 — End: 2022-11-06
  Administered 2022-11-06: 30 mg via INTRAVENOUS

## 2022-11-06 MED ORDER — ONDANSETRON HCL 4 MG/2ML IJ SOLN
INTRAMUSCULAR | Status: DC | PRN
  Administered 2022-11-06: 4 mg via INTRAVENOUS

## 2022-11-06 SURGICAL SUPPLY — 15 items

## 2022-11-06 NOTE — Interval H&P Note (Signed)
History and Physical Interval Note:  11/06/2022 8:21 AM  [name]  has presented today for surgery, with the diagnosis of Pyloric thickening on CT, abdominal pain, nausea, vomiting.  The various methods of treatment have been discussed with the patient and family. After consideration of risks, benefits and other options for treatment, the patient has consented to  Procedure(s): ESOPHAGOGASTRODUODENOSCOPY (EGD) WITH PROPOFOL (N/A) as a surgical intervention.  The patient's history has been reviewed, patient examined, no change in status, stable for surgery.  I have reviewed the patient's chart and labs.  Questions were answered to the patient's satisfaction.     Imogene Burn

## 2022-11-06 NOTE — Transfer of Care (Addendum)
Immediate Anesthesia Transfer of Care Note  Patient: [name]  Procedure(s) Performed: ESOPHAGOGASTRODUODENOSCOPY (EGD) WITH PROPOFOL BIOPSY  Patient Location: Endoscopy Unit  Anesthesia Type:MAC  Level of Consciousness: awake, alert , oriented, patient cooperative, and responds to stimulation  Airway & Oxygen Therapy: Patient Spontanous Breathing and Patient connected to nasal cannula oxygen  Post-op Assessment: Report given to RN and Post -op Vital signs reviewed and stable  Post vital signs: Reviewed and stable  Last Vitals:  Vitals Value Taken Time  BP 144/103 11/06/22 0910  Temp 36.4 C 11/06/22 0908  Pulse 88 11/06/22 0919  Resp 18 11/06/22 0919  SpO2 100 % 11/06/22 0919  Vitals shown include unfiled device data.  Last Pain:  Vitals:   11/06/22 0908  TempSrc: Temporal  PainSc: 0-No pain      Patients Stated Pain Goal: 0 (11/04/22 0500)  Complications: No notable events documented.

## 2022-11-06 NOTE — Progress Notes (Signed)
PROGRESS NOTE    [name]  WUJ:[id number] DOB: Mar 06, 1965 DOA: 11/03/2022 PCP: Esperanza Richters, PA-C    Brief Narrative:  57 year old with history of hypertension, hyperlipidemia, type 2 diabetes on insulin, CKD stage IIIa, history of peptic ulcer disease and anxiety presented with 3 days of abdominal pain, nausea and intractable vomiting without any aggravating factors. Unable to eat anything so came to the ER. in the emergency room was initially tachycardic, blood pressure 90. WBC 13.8, lactic acid 5.4. CT scan abdomen pelvis without significant findings except pyloric wall thickening. Patient with history of EGD and gastric emptying test that was normal in the past.  9/26, upper GI endoscopy with evidence of retained food.    Assessment & Plan:   Intractable nausea vomiting, upper abdominal pain and abnormal CT scan: Gastroparesis symptoms.  EGD with no evidence of peptic ulcer disease. Protonix, change to oral. Reglan 5 mg with meals. Upper GI endoscopy as above. Continue IV fluids.  Challenge for diet tolerance today.  Left upper quadrant abdominal pain: Patient has point tenderness along the left floating ribs.  No palpable fracture.  Probably musculoskeletal injury from nausea.  Pain medication, local lidocaine patch.  Mobility.  AKI CKD stage IIIa: Baseline creatinine about 1.1.  On maintenance IV fluids.  Improving.  Lactic acidosis tachycardia secondary to dehydration: No evidence of infection.  Improved with IV fluids.  Continue today.  Type 2 diabetes with hyperglycemia: Well-controlled today in the hospital.  At home patient takes metformin, Ozempic and 40 units of insulin at bedtime.  She is on half dose of insulin .  Blood sugars are acceptable.  Hypertension: Blood pressure stable on amlodipine and carvedilol.  Hypokalemia Hypomagnesemia Hypophosphatemia Secondary to poor intake.  Replaced with improvement.   DVT prophylaxis: SCDs Start: 11/04/22  [id number]   Code Status: Full code Family Communication: None at the bedside Disposition Plan: Status is: Inpatient Remains inpatient appropriate because: Unable to tolerate oral intake     Consultants:  Gastroenterology  Procedures:  None  Antimicrobials:  None   Subjective:  Patient seen and examined.  Came back from procedure.  She is trying to eat some liquids.  Gets nauseated but no vomiting.  Her main concern is left upper quadrant abdominal pain that is worse with mobility, positioning.  Objective: Vitals:   11/06/22 0908 11/06/22 0920 11/06/22 0930 11/06/22 1017  BP: (!) 134/98 (!) 154/96 (!) 152/90 (!) 136/95  Pulse:  85 86 97  Resp:  12 15   Temp: (!) 97.5 F (36.4 C)     TempSrc: Temporal     SpO2:  100% 96%   Weight:      Height:        Intake/Output Summary (Last 24 hours) at 11/06/2022 1112 Last data filed at 11/06/2022 0919 Gross per 24 hour  Intake 366.42 ml  Output --  Net 366.42 ml   Filed Weights   11/04/22 0500 11/05/22 0458 11/06/22 0306  Weight: 79.7 kg 76.8 kg 80.2 kg    Examination:  General exam: Appears Slightly anxious on exam. Respiratory system: No added sounds. Cardiovascular system: S1 & S2 heard, RRR. No pedal edema. Gastrointestinal system: Abdomen is nondistended, soft and mild diffuse tenderness along the floating ribs.  Abdomen is nontender and soft.  Normal bowel sounds heard. Central nervous system: Alert and oriented. No focal neurological deficits. Extremities: Symmetric 5 x 5 power.    Data Reviewed: I have personally reviewed following labs and imaging studies  CBC: Recent Labs  Lab 11/03/22 1925 11/04/22 0620 11/05/22 0817 11/06/22 0727  WBC 13.8* 15.0* 14.6* 11.3*  NEUTROABS 9.9*  --   --   --   HGB 13.7 13.2 12.3 11.8*  HCT 41.2 39.1 36.8 35.2*  MCV 84.4 86.3 87.0 88.4  PLT 320 271 242 230   Basic Metabolic Panel: Recent Labs  Lab 11/03/22 1925 11/04/22 0620 11/05/22 0817 11/06/22 0727  NA 137  133* 137 139  K 4.9 3.6 3.3* 3.8  CL 101 97* 102 105  CO2 18* 24 25 28   GLUCOSE 259* 202* 90 125*  BUN 33* 21* 13 7  CREATININE 1.56* 1.09* 1.14* 1.13*  CALCIUM 9.5 8.9 8.6* 8.3*  MG  --  1.4* 1.7  --   PHOS  --   --  2.1*  --    GFR: Estimated Creatinine Clearance: 63.4 mL/min (A) (by C-G formula based on SCr of 1.13 mg/dL (H)). Liver Function Tests: Recent Labs  Lab 11/03/22 1925  AST 19  ALT 15  ALKPHOS 121  BILITOT 0.7  PROT 8.7*  ALBUMIN 3.5   No results for input(s): "LIPASE", "AMYLASE" in the last 168 hours. No results for input(s): "AMMONIA" in the last 168 hours. Coagulation Profile: Recent Labs  Lab 11/03/22 1925  INR 1.1   Cardiac Enzymes: No results for input(s): "CKTOTAL", "CKMB", "CKMBINDEX", "TROPONINI" in the last 168 hours. BNP (last 3 results) No results for input(s): "PROBNP" in the last 8760 hours. HbA1C: No results for input(s): "HGBA1C" in the last 72 hours. CBG: Recent Labs  Lab 11/05/22 1610 11/05/22 1948 11/06/22 0014 11/06/22 0515 11/06/22 1011  GLUCAP 157* 169* 141* 145* 143*   Lipid Profile: No results for input(s): "CHOL", "HDL", "LDLCALC", "TRIG", "CHOLHDL", "LDLDIRECT" in the last 72 hours. Thyroid Function Tests: Recent Labs    11/06/22 0727  TSH 1.416   Anemia Panel: No results for input(s): "VITAMINB12", "FOLATE", "FERRITIN", "TIBC", "IRON", "RETICCTPCT" in the last 72 hours. Sepsis Labs: Recent Labs  Lab 11/03/22 1939 11/04/22 0029 11/04/22 0620  PROCALCITON  --   --  <0.10  LATICACIDVEN 5.4* 1.7  --     Recent Results (from the past 240 hour(s))  Culture, blood (single)     Status: None (Preliminary result)   Collection Time: 11/03/22  7:25 PM   Specimen: BLOOD  Result Value Ref Range Status   Specimen Description BLOOD SITE NOT SPECIFIED  Final   Special Requests   Final    BOTTLES DRAWN AEROBIC AND ANAEROBIC Blood Culture adequate volume   Culture   Final    NO GROWTH 3 DAYS Performed at [facility], [address], [place], [place] [address]    Report Status PENDING  Incomplete  Blood Culture (routine x 2)     Status: None (Preliminary result)   Collection Time: 11/03/22  7:28 PM   Specimen: BLOOD  Result Value Ref Range Status   Specimen Description BLOOD SITE NOT SPECIFIED  Final   Special Requests   Final    BOTTLES DRAWN AEROBIC AND ANAEROBIC Blood Culture adequate volume   Culture   Final    NO GROWTH 3 DAYS Performed at [facility], [address], [place], [place] [address]    Report Status PENDING  Incomplete  Resp panel by RT-PCR (RSV, Flu A&B, Covid) Anterior Nasal Swab     Status: None   Collection Time: 11/03/22  8:50 PM   Specimen: Anterior Nasal Swab  Result Value Ref Range Status  SARS Coronavirus 2 by RT PCR NEGATIVE NEGATIVE Final   Influenza A by PCR NEGATIVE NEGATIVE Final   Influenza B by PCR NEGATIVE NEGATIVE Final    Comment: (NOTE) The Xpert Xpress SARS-CoV-2/FLU/RSV plus assay is intended as an aid in the diagnosis of influenza from Nasopharyngeal swab specimens and should not be used as a sole basis for treatment. Nasal washings and aspirates are unacceptable for Xpert Xpress SARS-CoV-2/FLU/RSV testing.  Fact Sheet for Patients: BloggerCourse.com  Fact Sheet for Healthcare Providers: SeriousBroker.it  This test is not yet approved or cleared by the [place] FDA and has been authorized for detection and/or diagnosis of SARS-CoV-2 by FDA under an Emergency Use Authorization (EUA). This EUA will remain in effect (meaning this test can be used) for the duration of the COVID-19 declaration under Section 564(b)(1) of the Act, 21 U.S.C. section 360bbb-3(b)(1), unless the authorization is terminated or revoked.     Resp Syncytial Virus by PCR NEGATIVE NEGATIVE Final    Comment: (NOTE) Fact Sheet for Patients: BloggerCourse.com  Fact Sheet for Healthcare  Providers: SeriousBroker.it  This test is not yet approved or cleared by the [place] FDA and has been authorized for detection and/or diagnosis of SARS-CoV-2 by FDA under an Emergency Use Authorization (EUA). This EUA will remain in effect (meaning this test can be used) for the duration of the COVID-19 declaration under Section 564(b)(1) of the Act, 21 U.S.C. section 360bbb-3(b)(1), unless the authorization is terminated or revoked.  Performed at [facility], [address], [place], [place] [address]          Radiology Studies: No results found.      Scheduled Meds:  amLODipine  5 mg Oral Daily   atorvastatin  80 mg Oral Daily   carvedilol  25 mg Oral BID WC   gabapentin  300 mg Oral TID   insulin aspart  0-6 Units Subcutaneous Q4H   insulin glargine-yfgn  10 Units Subcutaneous QHS   lidocaine  1 patch Transdermal Q24H   metoCLOPramide (REGLAN) injection  10 mg Intravenous 60 min Pre-Op   metoCLOPramide  5 mg Oral TID AC   pantoprazole  40 mg Oral BID   sodium chloride flush  3 mL Intravenous Q12H   sucralfate  1 g Oral Q6H   Continuous Infusions:  lactated ringers Stopped (11/06/22 0905)   promethazine (PHENERGAN) injection (IM or IVPB) 12.5 mg (11/06/22 0025)     LOS: 2 days    Time spent: 35 minutes    Dorcas Carrow, MD [facility]

## 2022-11-06 NOTE — Op Note (Signed)
[facility] Patient Name: [name] Procedure Date : 11/06/2022 MRN: [id number] Attending MD: Particia Lather , , [id number] Date of Birth: June 03, 1965 CSN: [id number] Age: 57 Admit Type: Inpatient Procedure:                Upper GI endoscopy Indications:              Abdominal pain in the left upper quadrant,                            Coffee-ground emesis, Nausea with vomiting Providers:                Madelyn Brunner" Daylene Posey, RN, Suzy Bouchard, RN, Sunday Corn Mbumina, Technician Referring MD:             Hospitalist team Medicines:                Monitored Anesthesia Care Complications:            No immediate complications. Estimated Blood Loss:     Estimated blood loss was minimal. Procedure:                Pre-Anesthesia Assessment:                           - Prior to the procedure, a History and Physical                            was performed, and patient medications and                            allergies were reviewed. The patient's tolerance of                            previous anesthesia was also reviewed. The risks                            and benefits of the procedure and the sedation                            options and risks were discussed with the patient.                            All questions were answered, and informed consent                            was obtained. Prior Anticoagulants: The patient has                            taken no anticoagulant or antiplatelet agents. ASA                            Grade Assessment: III - A patient with severe  systemic disease. After reviewing the risks and                            benefits, the patient was deemed in satisfactory                            condition to undergo the procedure.                           After obtaining informed consent, the endoscope was                            passed under direct vision.  Throughout the                            procedure, the patient's blood pressure, pulse, and                            oxygen saturations were monitored continuously. The                            [id number]-H190 ([id number] Olympus endoscope was introduced                            through the mouth, and advanced to the second part                            of duodenum. The upper GI endoscopy was                            accomplished without difficulty. The patient                            tolerated the procedure well. Scope In: Scope Out: Findings:      Localized mucosal variance characterized by nodularity was found in the       distal esophagus. Biopsies were taken with a cold forceps for histology.      A medium amount of food (residue) was found in the gastric body.      Localized moderate inflammation characterized by congestion (edema),       erosions, erythema and granularity was found in the gastric body and in       the gastric antrum. Biopsies were taken with a cold forceps for       histology.      The examined duodenum was normal. Biopsies were taken with a cold       forceps for histology. Impression:               - Esophageal mucosal variant. Biopsied.                           - A medium amount of food (residue) in the stomach.                           - Gastritis. Biopsied.                           -  Normal examined duodenum. Biopsied. Recommendation:           - Return patient to hospital ward for ongoing care.                           - Await pathology results.                           - Agree with continued PPI BID.                           - Start IV Reglan 5 mg TID.                           - The findings and recommendations were discussed                            with the patient. Procedure Code(s):        --- Professional ---                           [phone], Esophagogastroduodenoscopy, flexible,                            transoral; with biopsy,  single or multiple Diagnosis Code(s):        --- Professional ---                           K22.89, Other specified disease of esophagus                           K29.70, Gastritis, unspecified, without bleeding                           R10.12, Left upper quadrant pain                           K92.0, Hematemesis                           R11.2, Nausea with vomiting, unspecified CPT copyright 2022 American Medical Association. All rights reserved. The codes documented in this report are preliminary and upon coder review may  be revised to meet current compliance requirements. Dr Particia Lather "[name]" Leonides Schanz,  11/06/2022 9:22:00 AM Number of Addenda: 0

## 2022-11-06 NOTE — Anesthesia Preprocedure Evaluation (Addendum)
Anesthesia Evaluation  Patient identified by MRN, date of birth, ID band Patient awake    Reviewed: Allergy & Precautions, NPO status , Patient's Chart, lab work & pertinent test results, reviewed documented beta blocker date and time   History of Anesthesia Complications Negative for: history of anesthetic complications  Airway Mallampati: II  TM Distance: >3 FB Neck ROM: Full    Dental  (+) Dental Advisory Given, Chipped   Pulmonary asthma , former smoker   Pulmonary exam normal        Cardiovascular hypertension, Pt. on medications and Pt. on home beta blockers + CAD and + Past MI  Normal cardiovascular exam     Neuro/Psych  Headaches PSYCHIATRIC DISORDERS Anxiety Depression       GI/Hepatic Neg liver ROS, PUD,GERD  Controlled,, Gastroparesis    Endo/Other  diabetes, Type 2, Insulin Dependent, Oral Hypoglycemic Agents    Renal/GU Renal InsufficiencyRenal disease     Musculoskeletal negative musculoskeletal ROS (+)    Abdominal   Peds  Hematology negative hematology ROS (+)   Anesthesia Other Findings On GLP-1a   Reproductive/Obstetrics                             Anesthesia Physical Anesthesia Plan  ASA: 3  Anesthesia Plan: MAC   Post-op Pain Management: Minimal or no pain anticipated   Induction:   PONV Risk Score and Plan: 2 and Propofol infusion and Treatment may vary due to age or medical condition  Airway Management Planned: Nasal Cannula and Natural Airway  Additional Equipment: None  Intra-op Plan:   Post-operative Plan:   Informed Consent: I have reviewed the patients History and Physical, chart, labs and discussed the procedure including the risks, benefits and alternatives for the proposed anesthesia with the patient or authorized representative who has indicated his/her understanding and acceptance.       Plan Discussed with: CRNA and  Anesthesiologist  Anesthesia Plan Comments:        Anesthesia Quick Evaluation

## 2022-11-06 NOTE — Anesthesia Postprocedure Evaluation (Signed)
Anesthesia Post Note  Patient: [name]  Procedure(s) Performed: ESOPHAGOGASTRODUODENOSCOPY (EGD) WITH PROPOFOL BIOPSY     Patient location during evaluation: PACU Anesthesia Type: MAC Level of consciousness: awake and alert Pain management: pain level controlled Vital Signs Assessment: post-procedure vital signs reviewed and stable Respiratory status: spontaneous breathing, nonlabored ventilation and respiratory function stable Cardiovascular status: stable and blood pressure returned to baseline Anesthetic complications: no   No notable events documented.  Last Vitals:  Vitals:   11/06/22 0930 11/06/22 1017  BP: (!) 152/90 (!) 136/95  Pulse: 86 97  Resp: 15   Temp:    SpO2: 96%                  Beryle Lathe

## 2022-11-07 ENCOUNTER — Encounter: Payer: Self-pay | Admitting: Internal Medicine

## 2022-11-07 DIAGNOSIS — K920 Hematemesis: Secondary | ICD-10-CM | POA: Diagnosis not present

## 2022-11-07 DIAGNOSIS — R109 Unspecified abdominal pain: Secondary | ICD-10-CM | POA: Diagnosis not present

## 2022-11-07 DIAGNOSIS — R112 Nausea with vomiting, unspecified: Secondary | ICD-10-CM | POA: Diagnosis not present

## 2022-11-07 DIAGNOSIS — R1110 Vomiting, unspecified: Secondary | ICD-10-CM | POA: Diagnosis not present

## 2022-11-07 LAB — GLUCOSE, CAPILLARY
Glucose-Capillary: 121 mg/dL — ABNORMAL HIGH (ref 70–99)
Glucose-Capillary: 144 mg/dL — ABNORMAL HIGH (ref 70–99)
Glucose-Capillary: 206 mg/dL — ABNORMAL HIGH (ref 70–99)
Glucose-Capillary: 219 mg/dL — ABNORMAL HIGH (ref 70–99)
Glucose-Capillary: 131 mg/dL — ABNORMAL HIGH (ref 70–99)

## 2022-11-07 LAB — SURGICAL PATHOLOGY

## 2022-11-07 MED ORDER — INSULIN ASPART 100 UNIT/ML IJ SOLN: 0.0000 [IU] | Freq: Every day | INTRAMUSCULAR | Status: DC

## 2022-11-07 MED ORDER — NAPROXEN 250 MG PO TABS
250.0000 mg | ORAL_TABLET | Freq: Two times a day (BID) | ORAL | Status: DC
  Administered 2022-11-07 – 2022-11-08 (×3): 250 mg via ORAL
  Filled 2022-11-07 (×3): qty 1

## 2022-11-07 MED ORDER — OXYCODONE HCL 5 MG PO TABS
10.0000 mg | ORAL_TABLET | Freq: Four times a day (QID) | ORAL | Status: DC | PRN
  Administered 2022-11-07: 10 mg via ORAL
  Filled 2022-11-07: qty 2

## 2022-11-07 MED ORDER — INSULIN ASPART 100 UNIT/ML IJ SOLN
0.0000 [IU] | Freq: Three times a day (TID) | INTRAMUSCULAR | Status: DC
  Administered 2022-11-08: 2 [IU] via SUBCUTANEOUS

## 2022-11-07 MED ORDER — BACLOFEN 10 MG PO TABS
5.0000 mg | ORAL_TABLET | Freq: Three times a day (TID) | ORAL | Status: DC
  Administered 2022-11-07 (×2): 5 mg via ORAL
  Filled 2022-11-07 (×4): qty 1

## 2022-11-07 NOTE — Plan of Care (Addendum)
Pt  complaining of constant pain under her left breast. Pain medication given pt rate pain at 1740 pt rate pain a 5 out of 10.

## 2022-11-07 NOTE — Progress Notes (Addendum)
Progress Note   Subjective  Hospital day #5 Chief Complaint: Chronic episodic nausea and vomiting with upper abdominal pain  Status post EGD 11/06/2022 with gastritis and signs of gastroparesis, started on Reglan  Today, patient tells me that she is still only comfortable with really eating broths as a lot of the other solid foods tend to hurt her stomach when they go down.  She has had no further vomiting.  No bowel movement in 2 days but tells me that sometimes normal for her and she is also not Much down recently.  Aware of findings from EGD.   Objective   Vital signs in last 24 hours: Temp:  [98.5 F (36.9 C)-99.3 F (37.4 C)] 99.2 F (37.3 C) (09/27 1000) Pulse Rate:  [95-101] 101 (09/27 1000) Resp:  [14-18] 14 (09/27 1000) BP: (103-146)/(61-95) 146/95 (09/27 1000) SpO2:  [94 %-98 %] 97 % (09/27 1000) Last BM Date : 11/05/22 General:    AA female in NAD Heart:  Regular rate and rhythm; no murmurs Lungs: Respirations even and unlabored, lungs CTA bilaterally Abdomen:  Soft, ttp over epigastrum and nondistended. Normal bowel sounds. Psych:  Cooperative. Normal mood and affect.  Intake/Output from previous day: 09/26 0701 - 09/27 0700 In: 1086.4 [P.O.:475; I.V.:261.4; IV Piggyback:350] Out: -   Lab Results: Recent Labs    11/05/22 0817 11/06/22 0727  WBC 14.6* 11.3*  HGB 12.3 11.8*  HCT 36.8 35.2*  PLT 242 230   BMET Recent Labs    11/05/22 0817 11/06/22 0727  NA 137 139  K 3.3* 3.8  CL 102 105  CO2 25 28  GLUCOSE 90 125*  BUN 13 7  CREATININE 1.14* 1.13*  CALCIUM 8.6* 8.3*   EGD 9/26: Findings:      Localized mucosal variance characterized by nodularity was found in the       distal esophagus. Biopsies were taken with a cold forceps for histology.      A medium amount of food (residue) was found in the gastric body.      Localized moderate inflammation characterized by congestion (edema),       erosions, erythema and granularity was found in the  gastric body and in       the gastric antrum. Biopsies were taken with a cold forceps for       histology.      The examined duodenum was normal. Biopsies were taken with a cold       forceps for histology. Impression:               - Esophageal mucosal variant. Biopsied.                           - A medium amount of food (residue) in the stomach.                           - Gastritis. Biopsied.                           - Normal examined duodenum. Biopsied. Recommendation:           - Return patient to hospital ward for ongoing care.                           - Await pathology results.                           -  Agree with continued PPI BID.                           - Start IV Reglan 5 mg TID.                           - The findings and recommendations were discussed                            with the patient.   Assessment / Plan:   Assessment: 1.  Chronic episodic nausea and vomiting with upper abdominal pain: Status post EGD with gastritis and gastroparesis as above, started on Reglan and some better 2.  Anxiety 3.  Diabetes type 2  Plan: 1.  Continue Reglan 3 times daily with meals 2.  Continue Pantoprazole 40 mg twice daily, continue this p.o. twice daily outpatient 3.  Patient needs to follow up with her primary [facility] at discharge 4.  We will sign off.  Thank you for your kind consultation.    LOS: 3 days   Unk Lightning  11/07/2022, 12:00 PM

## 2022-11-07 NOTE — Progress Notes (Signed)
PROGRESS NOTE    [name]  WUJ:[id number] DOB: 1965/05/23 DOA: 11/03/2022 PCP: Esperanza Richters, PA-C    Brief Narrative:  57 year old with history of neuropathy and chronic pain issues on outpatient pain management, hypertension, hyperlipidemia, type 2 diabetes on insulin, CKD stage IIIa, history of peptic ulcer disease and anxiety presented with 3 days of abdominal pain, nausea and intractable vomiting without any aggravating factors. Unable to eat anything so came to the ER. in the emergency room was initially tachycardic, blood pressure 90. WBC 13.8, lactic acid 5.4. CT scan abdomen pelvis without significant findings except pyloric wall thickening. Patient with history of EGD and gastric emptying test that was normal in the past.  9/26, upper GI endoscopy with evidence of retained food.  No evidence of peptic ulcer disease.  She was found to have gastroparesis symptoms.  Persistently symptomatic. She also has unrelated to gastroparesis, left lower rib pains.    Assessment & Plan:   Intractable nausea vomiting, upper abdominal pain and abnormal CT scan:  Gastroparesis symptoms.  EGD with no evidence of peptic ulcer disease. Protonix, change to oral. Reglan 5 mg with meals. Upper GI endoscopy as above. Challenge for diet tolerance today.  Continue Reglan premeals. Gastroparesis diet.  Left upper quadrant abdominal pain: Patient has point tenderness along the left floating ribs.  No palpable fracture.  Probably musculoskeletal injury from nausea.  Persistent pain. Patient on gabapentin at home, continued On oxycodone 10 mg twice daily at home, currently on 10 mg every 6 hours. There was no evidence of peptic ulcer disease, will add Naprosyn to 50 mg twice daily. Lidocaine patch. IV Dilaudid as needed, however will encourage using oral Dilaudid.  AKI CKD stage IIIa: Baseline creatinine about 1.1.  Stabilized.  Lactic acidosis tachycardia secondary to dehydration: No  evidence of infection.  Improved with IV fluids.  Continue today.  Type 2 diabetes with hyperglycemia: Well-controlled today in the hospital.  At home patient takes metformin, Ozempic and 40 units of insulin at bedtime.  She is on half dose of insulin Blood sugars are acceptable.  Hypertension: Blood pressure stable on amlodipine and carvedilol.  Hypokalemia Hypomagnesemia Hypophosphatemia Secondary to poor intake.  Replaced with improvement.   DVT prophylaxis: SCDs Start: 11/04/22 0223   Code Status: Full code Family Communication: None at the bedside Disposition Plan: Status is: Inpatient Remains inpatient appropriate because: Persistent pain, needing IV pain medications.     Consultants:  Gastroenterology  Procedures:  Upper GI endoscopy  Antimicrobials:  None   Subjective: Seen and examined in the morning rounds.  Tearful and mostly concerned about left upper quadrant abdominal pain.  She has some nausea but able to tolerate about half of the meal.  Distressed due to ongoing pain.  Objective: Vitals:   11/06/22 2007 11/07/22 0406 11/07/22 0828 11/07/22 1000  BP: 105/75 122/73 111/61 (!) 146/95  Pulse: 98 95 96 (!) 101  Resp: 18 18 17 14   Temp: 99.3 F (37.4 C) 98.5 F (36.9 C) 99 F (37.2 C) 99.2 F (37.3 C)  TempSrc: Oral Oral  Oral  SpO2: 95% 95% 98% 97%  Weight:      Height:        Intake/Output Summary (Last 24 hours) at 11/07/2022 1040 Last data filed at 11/07/2022 0454 Gross per 24 hour  Intake 588 ml  Output --  Net 588 ml   Filed Weights   11/04/22 0500 11/05/22 0458 11/06/22 0306  Weight: 79.7 kg 76.8 kg 80.2 kg  Examination:  General exam: Anxious.  Tearful.  Not in any distress. Respiratory system: No added sounds. Cardiovascular system: S1 & S2 heard, RRR. No pedal edema. Gastrointestinal system: Abdomen is nondistended, soft . diffuse tenderness along the left floating ribs.  Abdomen is nontender and soft.  Normal bowel sounds  heard. Central nervous system: Alert and oriented. No focal neurological deficits. Extremities: Symmetric 5 x 5 power.    Data Reviewed: I have personally reviewed following labs and imaging studies  CBC: Recent Labs  Lab 11/03/22 1925 11/04/22 0620 11/05/22 0817 11/06/22 0727  WBC 13.8* 15.0* 14.6* 11.3*  NEUTROABS 9.9*  --   --   --   HGB 13.7 13.2 12.3 11.8*  HCT 41.2 39.1 36.8 35.2*  MCV 84.4 86.3 87.0 88.4  PLT 320 271 242 230   Basic Metabolic Panel: Recent Labs  Lab 11/03/22 1925 11/04/22 0620 11/05/22 0817 11/06/22 0727  NA 137 133* 137 139  K 4.9 3.6 3.3* 3.8  CL 101 97* 102 105  CO2 18* 24 25 28   GLUCOSE 259* 202* 90 125*  BUN 33* 21* 13 7  CREATININE 1.56* 1.09* 1.14* 1.13*  CALCIUM 9.5 8.9 8.6* 8.3*  MG  --  1.4* 1.7  --   PHOS  --   --  2.1*  --    GFR: Estimated Creatinine Clearance: 63.4 mL/min (A) (by C-G formula based on SCr of 1.13 mg/dL (H)). Liver Function Tests: Recent Labs  Lab 11/03/22 1925  AST 19  ALT 15  ALKPHOS 121  BILITOT 0.7  PROT 8.7*  ALBUMIN 3.5   No results for input(s): "LIPASE", "AMYLASE" in the last 168 hours. No results for input(s): "AMMONIA" in the last 168 hours. Coagulation Profile: Recent Labs  Lab 11/03/22 1925  INR 1.1   Cardiac Enzymes: No results for input(s): "CKTOTAL", "CKMB", "CKMBINDEX", "TROPONINI" in the last 168 hours. BNP (last 3 results) No results for input(s): "PROBNP" in the last 8760 hours. HbA1C: No results for input(s): "HGBA1C" in the last 72 hours. CBG: Recent Labs  Lab 11/06/22 1511 11/06/22 2010 11/06/22 2341 11/07/22 0405 11/07/22 0831  GLUCAP 167* 178* 232* 121* 144*   Lipid Profile: No results for input(s): "CHOL", "HDL", "LDLCALC", "TRIG", "CHOLHDL", "LDLDIRECT" in the last 72 hours. Thyroid Function Tests: Recent Labs    11/06/22 0727  TSH 1.416   Anemia Panel: No results for input(s): "VITAMINB12", "FOLATE", "FERRITIN", "TIBC", "IRON", "RETICCTPCT" in the last  72 hours. Sepsis Labs: Recent Labs  Lab 11/03/22 1939 11/04/22 0029 11/04/22 0620  PROCALCITON  --   --  <0.10  LATICACIDVEN 5.4* 1.7  --     Recent Results (from the past 240 hour(s))  Culture, blood (single)     Status: None (Preliminary result)   Collection Time: 11/03/22  7:25 PM   Specimen: BLOOD  Result Value Ref Range Status   Specimen Description BLOOD SITE NOT SPECIFIED  Final   Special Requests   Final    BOTTLES DRAWN AEROBIC AND ANAEROBIC Blood Culture adequate volume   Culture   Final    NO GROWTH 4 DAYS Performed at [facility], [address] [place], [place] [address]    Report Status PENDING  Incomplete  Blood Culture (routine x 2)     Status: None (Preliminary result)   Collection Time: 11/03/22  7:28 PM   Specimen: BLOOD  Result Value Ref Range Status   Specimen Description BLOOD SITE NOT SPECIFIED  Final   Special Requests  Final    BOTTLES DRAWN AEROBIC AND ANAEROBIC Blood Culture adequate volume   Culture   Final    NO GROWTH 4 DAYS Performed at [facility], [address], [place], [place] [address]    Report Status PENDING  Incomplete  Resp panel by RT-PCR (RSV, Flu A&B, Covid) Anterior Nasal Swab     Status: None   Collection Time: 11/03/22  8:50 PM   Specimen: Anterior Nasal Swab  Result Value Ref Range Status   SARS Coronavirus 2 by RT PCR NEGATIVE NEGATIVE Final   Influenza A by PCR NEGATIVE NEGATIVE Final   Influenza B by PCR NEGATIVE NEGATIVE Final    Comment: (NOTE) The Xpert Xpress SARS-CoV-2/FLU/RSV plus assay is intended as an aid in the diagnosis of influenza from Nasopharyngeal swab specimens and should not be used as a sole basis for treatment. Nasal washings and aspirates are unacceptable for Xpert Xpress SARS-CoV-2/FLU/RSV testing.  Fact Sheet for Patients: BloggerCourse.com  Fact Sheet for Healthcare Providers: SeriousBroker.it  This test is not yet approved  or cleared by the [place] FDA and has been authorized for detection and/or diagnosis of SARS-CoV-2 by FDA under an Emergency Use Authorization (EUA). This EUA will remain in effect (meaning this test can be used) for the duration of the COVID-19 declaration under Section 564(b)(1) of the Act, 21 U.S.C. section 360bbb-3(b)(1), unless the authorization is terminated or revoked.     Resp Syncytial Virus by PCR NEGATIVE NEGATIVE Final    Comment: (NOTE) Fact Sheet for Patients: BloggerCourse.com  Fact Sheet for Healthcare Providers: SeriousBroker.it  This test is not yet approved or cleared by the [place] FDA and has been authorized for detection and/or diagnosis of SARS-CoV-2 by FDA under an Emergency Use Authorization (EUA). This EUA will remain in effect (meaning this test can be used) for the duration of the COVID-19 declaration under Section 564(b)(1) of the Act, 21 U.S.C. section 360bbb-3(b)(1), unless the authorization is terminated or revoked.  Performed at [facility], [address], [place], [place] [address]          Radiology Studies: No results found.      Scheduled Meds:  amLODipine  5 mg Oral Daily   atorvastatin  80 mg Oral Daily   baclofen  5 mg Oral TID   carvedilol  25 mg Oral BID WC   gabapentin  300 mg Oral TID   insulin aspart  0-6 Units Subcutaneous Q4H   insulin glargine-yfgn  10 Units Subcutaneous QHS   lidocaine  1 patch Transdermal Q24H   metoCLOPramide (REGLAN) injection  10 mg Intravenous 60 min Pre-Op   metoCLOPramide  5 mg Oral TID AC   naproxen  250 mg Oral BID WC   pantoprazole  40 mg Oral BID   sodium chloride flush  3 mL Intravenous Q12H   sucralfate  1 g Oral Q6H   Continuous Infusions:  promethazine (PHENERGAN) injection (IM or IVPB) 200 mL/hr at 11/07/22 0454     LOS: 3 days    Time spent: 35 minutes    Dorcas Carrow, MD [facility]

## 2022-11-08 DIAGNOSIS — R6510 Systemic inflammatory response syndrome (SIRS) of non-infectious origin without acute organ dysfunction: Secondary | ICD-10-CM | POA: Diagnosis not present

## 2022-11-08 DIAGNOSIS — R109 Unspecified abdominal pain: Secondary | ICD-10-CM | POA: Diagnosis not present

## 2022-11-08 DIAGNOSIS — R1110 Vomiting, unspecified: Secondary | ICD-10-CM | POA: Diagnosis not present

## 2022-11-08 LAB — CULTURE, BLOOD (SINGLE): Culture: NO GROWTH

## 2022-11-08 LAB — GLUCOSE, CAPILLARY
Glucose-Capillary: 143 mg/dL — ABNORMAL HIGH (ref 70–99)
Glucose-Capillary: 153 mg/dL — ABNORMAL HIGH (ref 70–99)

## 2022-11-08 LAB — CULTURE, BLOOD (ROUTINE X 2): Culture: NO GROWTH

## 2022-11-08 MED ORDER — PANTOPRAZOLE SODIUM 40 MG PO TBEC: 40.0000 mg | DELAYED_RELEASE_TABLET | Freq: Two times a day (BID) | ORAL | 0 refills | Status: DC

## 2022-11-08 MED ORDER — SUCRALFATE 1 G PO TABS: 1.0000 g | ORAL_TABLET | Freq: Four times a day (QID) | ORAL | 11 refills | Status: DC

## 2022-11-08 MED ORDER — LIDOCAINE 5 % EX PTCH: 1.0000 | MEDICATED_PATCH | CUTANEOUS | 0 refills | Status: DC

## 2022-11-08 MED ORDER — NAPROXEN 250 MG PO TABS: 250.0000 mg | ORAL_TABLET | Freq: Two times a day (BID) | ORAL | 0 refills | Status: AC

## 2022-11-08 MED ORDER — METOCLOPRAMIDE HCL 5 MG PO TABS: 5.0000 mg | ORAL_TABLET | Freq: Three times a day (TID) | ORAL | 0 refills | Status: DC

## 2022-11-08 MED ORDER — OXYCODONE HCL 10 MG PO TABS: 10.0000 mg | ORAL_TABLET | Freq: Four times a day (QID) | ORAL | 0 refills | Status: DC | PRN

## 2022-11-08 NOTE — Discharge Summary (Signed)
Physician Discharge Summary  [name]:[id number] DOB: 1965/10/19 DOA: 11/03/2022  PCP: Esperanza Richters, PA-C  Admit date: 11/03/2022 Discharge date: 11/08/2022  Admitted From: Home Disposition: Home  Recommendations for Outpatient Follow-up:  Follow up with PCP in 1-2 weeks Please obtain BMP/CBC in one week GI office will schedule follow-up  Home Health: N/A Equipment/Devices: N/A  Discharge Condition: Stable CODE STATUS: Full code Diet recommendation: Low-salt and low-carb diet, gastroparesis diet.  Frequent small meals.  Discharge summary: 57 year old with history of neuropathy and chronic pain issues on outpatient pain management, hypertension, hyperlipidemia, type 2 diabetes on insulin, CKD stage IIIa, history of peptic ulcer disease and anxiety presented with 3 days of abdominal pain, nausea and intractable vomiting without any aggravating factors. Unable to eat anything so came to the ER. in the emergency room was initially tachycardic, blood pressure 90. WBC 13.8, lactic acid 5.4. CT scan abdomen pelvis without significant findings except pyloric wall thickening. Patient with history of EGD and gastric emptying test that was normal in the past.  9/26, upper GI endoscopy with evidence of retained food.  No evidence of peptic ulcer disease.  She was found to have gastroparesis symptoms.  She also has unrelated to gastroparesis, left lower rib pain consistent with musculoskeletal pain.  # Intractable nausea vomiting, upper abdominal pain and abnormal CT scan:  Gastroparesis symptoms.  EGD with no evidence of peptic ulcer disease. Today patient tolerated soft meal along with use of Reglan. Patient be discharged home with Reglan 5 mg 3 times daily with meals, Protonix 40 mg twice daily, sucralfate 1 g 4 times daily.  GI will schedule follow-up. Discussed gastroparesis diet. Upper GI endoscopy as above.  # Left upper quadrant abdominal pain: Patient has point tenderness  along the left floating ribs.  No palpable fracture.  Probably musculoskeletal injury from nausea.  She remained in the hospital with persistent pain. Patient on gabapentin at home, continued On oxycodone 10 mg twice daily at home, currently on 10 mg every 6 hours.  Will give a short course of therapy. There was no evidence of peptic ulcer disease, will add Naprosyn to 50 mg twice daily for 5 days. Lidocaine patch. Patient can use muscle relaxants, she has baclofen at home.  Advised to avoid driving with multiple medications that can be potentially sedating.   AKI CKD stage IIIa: Baseline creatinine about 1.1.  Stabilized.   Lactic acidosis tachycardia secondary to dehydration: No evidence of infection.  Improved with IV fluids.  Asymptomatic today.   Type 2 diabetes with hyperglycemia: Well-controlled today in the hospital.  At home patient takes metformin, Ozempic and 40 units of insulin at bedtime.  Her appetite has improved.  Go back on home dose of insulin.   Hypertension: Blood pressure stable on amlodipine and carvedilol.   Hypokalemia Hypomagnesemia Hypophosphatemia Secondary to poor intake.  Replaced with improvement.  Stable for discharge.   Discharge Diagnoses:  Principal Problem:   Acute renal failure superimposed on stage 3a chronic kidney disease (HCC) Active Problems:   Uncontrolled type 2 diabetes mellitus with hyperglycemia, without long-term current use of insulin (HCC)   PUD (peptic ulcer disease)   Abdominal pain with vomiting   Anxiety   Hyperlipidemia   Hypertension   SIRS (systemic inflammatory response syndrome) (HCC)   Coffee ground emesis   Abnormal CT scan    Discharge Instructions  Discharge Instructions     Diet - low sodium heart healthy   Complete by: As directed  Increase activity slowly   Complete by: As directed       Allergies as of 11/08/2022       Reactions   Lisinopril Swelling   Angioedema   Oxycodone-acetaminophen Nausea  Only   Latex Itching, Swelling, Rash   Morphine Itching, Rash        Medication List     STOP taking these medications    promethazine 12.5 MG tablet Commonly known as: PHENERGAN       TAKE these medications    albuterol 108 (90 Base) MCG/ACT inhaler Commonly known as: VENTOLIN HFA Inhale 2 puffs into the lungs every 6 (six) hours as needed for wheezing or shortness of breath. What changed: when to take this   amLODipine 5 MG tablet Commonly known as: NORVASC Take 1 tablet (5 mg total) by mouth daily.   aspirin 81 MG tablet Take 81 mg by mouth daily.   atorvastatin 80 MG tablet Commonly known as: LIPITOR Take 1 tablet (80 mg total) by mouth daily.   baclofen 10 MG tablet Commonly known as: LIORESAL Take 1 tablet (10 mg total) by mouth 3 (three) times daily.   Basaglar KwikPen 100 UNIT/ML INJECT 40 UNITS SUBCUTANEOUSLY AT BEDTIME   busPIRone 15 MG tablet Commonly known as: BUSPAR Take 1 tablet (15 mg total) by mouth 2 (two) times daily. What changed:  when to take this reasons to take this   carvedilol 25 MG tablet Commonly known as: COREG Take 1 tablet (25 mg total) by mouth 2 (two) times daily with a meal.   fluticasone 50 MCG/ACT nasal spray Commonly known as: FLONASE Place 2 sprays into both nostrils daily.   fluticasone furoate-vilanterol 100-25 MCG/INH Aepb Commonly known as: Breo Ellipta Inhale 1 puff into the lungs daily.   FreeStyle Libre 3 Sensor Misc USE AS DIRECTED TO MONITOR BLOOD SUGAR. CHANGE EVERY 14 DAYS   gabapentin 300 MG capsule Commonly known as: NEURONTIN TAKE 2 CAPSULES BY MOUTH THREE TIMES DAILY   lidocaine 5 % Commonly known as: LIDODERM Place 1 patch onto the skin daily. Remove & Discard patch within 12 hours or as directed by MD   metFORMIN 500 MG 24 hr tablet Commonly known as: GLUCOPHAGE-XR Take 1 tablet (500 mg total) by mouth daily with breakfast.   metoCLOPramide 5 MG tablet Commonly known as: REGLAN Take 1  tablet (5 mg total) by mouth 3 (three) times daily before meals.   naproxen 250 MG tablet Commonly known as: NAPROSYN Take 1 tablet (250 mg total) by mouth 2 (two) times daily with a meal for 5 days.   nitroGLYCERIN 0.4 MG SL tablet Commonly known as: NITROSTAT Place 1 tablet (0.4 mg total) under the tongue every 5 (five) minutes as needed for chest pain.   ondansetron 4 MG tablet Commonly known as: ZOFRAN Take 4 mg by mouth every 8 (eight) hours as needed for nausea or vomiting.   Oxycodone HCl 10 MG Tabs Take 1 tablet (10 mg total) by mouth every 6 (six) hours as needed. 1 tab po bid prn severe pain. What changed:  how much to take how to take this when to take this reasons to take this   Ozempic (0.25 or 0.5 MG/DOSE) 2 MG/3ML Sopn Generic drug: Semaglutide(0.25 or 0.5MG /DOS) Inject 0.5 mg into the skin once a week. mondays   pantoprazole 40 MG tablet Commonly known as: PROTONIX Take 1 tablet (40 mg total) by mouth 2 (two) times daily.   PEN NEEDLES 31GX5/16" 31G X 8  MM Misc Use daily as directed   sucralfate 1 g tablet Commonly known as: Carafate Take 1 tablet (1 g total) by mouth 4 (four) times daily.   topiramate 50 MG tablet Commonly known as: TOPAMAX Take 1 tablet (50 mg total) by mouth 2 (two) times daily. What changed:  when to take this reasons to take this   VITAMIN D PO Take 5,000 Units by mouth daily.        Allergies  Allergen Reactions   Lisinopril Swelling    Angioedema   Oxycodone-Acetaminophen Nausea Only   Latex Itching, Swelling and Rash   Morphine Itching and Rash    Consultations: Gastroenterology   Procedures/Studies: CT ABDOMEN PELVIS W CONTRAST  Result Date: 11/03/2022 CLINICAL DATA:  Emesis abdomen pain EXAM: CT ABDOMEN AND PELVIS WITH CONTRAST TECHNIQUE: Multidetector CT imaging of the abdomen and pelvis was performed using the standard protocol following bolus administration of intravenous contrast. RADIATION DOSE  REDUCTION: This exam was performed according to the departmental dose-optimization program which includes automated exposure control, adjustment of the mA and/or kV according to patient size and/or use of iterative reconstruction technique. CONTRAST:  60mL OMNIPAQUE IOHEXOL 350 MG/ML SOLN COMPARISON:  Radiograph 11/03/2022, CT 07/28/2022 FINDINGS: Lower chest: Lung bases demonstrate no acute airspace disease. Hepatobiliary: No focal liver abnormality is seen. Status post cholecystectomy. No biliary dilatation. Pancreas: Unremarkable. No pancreatic ductal dilatation or surrounding inflammatory changes. Spleen: Normal in size without focal abnormality. Adrenals/Urinary Tract: Adrenal glands are unremarkable. Kidneys are normal, without renal calculi, focal lesion, or hydronephrosis. Bladder is unremarkable. Stomach/Bowel: Stomach nondistended. Possible mild pyloric wall thickening. No dilated small bowel. Negative appendix. Vascular/Lymphatic: Moderate aortic atherosclerosis. No aneurysm. No suspicious lymph nodes Reproductive: Hysterectomy. No adnexal mass small amount of air in the vagina. Chronic soft tissue fullness of the vagina. Other: Negative for pelvic effusion or free air Musculoskeletal: No acute or suspicious osseous abnormality. Multilevel degenerative changes IMPRESSION: 1. Possible mild pyloric wall thickening, correlate for peptic ulcer disease/gastritis. 2. Otherwise no CT evidence for acute intra-abdominal or pelvic abnormality. 3. Aortic atherosclerosis. Aortic Atherosclerosis (ICD10-I70.0). Electronically Signed   By: Jasmine Pang M.D.   On: 11/03/2022 22:02   DG Abdomen 1 View  Result Date: 11/03/2022 CLINICAL DATA:  Abdomen pain EXAM: ABDOMEN - 1 VIEW COMPARISON:  09/13/2017 FINDINGS: Surgical clips in the right upper quadrant. Nonspecific paucity of bowel gas. Numerous clips in the pelvis. No radiopaque calculi IMPRESSION: Nonspecific paucity of bowel gas. Electronically Signed   By: Jasmine Pang M.D.   On: 11/03/2022 21:56   DG Chest Port 1 View  Result Date: 11/03/2022 CLINICAL DATA:  Possible sepsis chest pain EXAM: PORTABLE CHEST 1 VIEW COMPARISON:  07/28/2022 FINDINGS: The heart size and mediastinal contours are within normal limits. Both lungs are clear. The visualized skeletal structures are unremarkable. IMPRESSION: No active disease. Electronically Signed   By: Jasmine Pang M.D.   On: 11/03/2022 21:55   (Echo, Carotid, EGD, Colonoscopy, ERCP)    Subjective: Patient was seen and examined.  Left upper quadrant pain is much better now.  Occasional nausea but she is able to eat all her meals.  Patient reported good pain control with additional dose of oxycodone.   Discharge Exam: Vitals:   11/08/22 0529 11/08/22 0800  BP: 122/79 133/78  Pulse: 91 96  Resp: 18 18  Temp: 98.2 F (36.8 C) 97.8 F (36.6 C)  SpO2: 96% 98%   Vitals:   11/07/22 1848 11/07/22 2058 11/08/22 0529 11/08/22 0800  BP:  95/67 122/79 133/78  Pulse:  90 91 96  Resp:  18 18 18   Temp:  98.6 F (37 C) 98.2 F (36.8 C) 97.8 F (36.6 C)  TempSrc:  Oral Oral   SpO2:  93% 96% 98%  Weight: 79.2 kg     Height:        General: Pt is alert, awake, not in acute distress Cardiovascular: RRR, S1/S2 +, no rubs, no gallops Respiratory: CTA bilaterally, no wheezing, no rhonchi Abdominal: Soft, NT, ND, bowel sounds + Extremities: no edema, no cyanosis    The results of significant diagnostics from this hospitalization (including imaging, microbiology, ancillary and laboratory) are listed below for reference.     Microbiology: Recent Results (from the past 240 hour(s))  Culture, blood (single)     Status: None   Collection Time: 11/03/22  7:25 PM   Specimen: BLOOD  Result Value Ref Range Status   Specimen Description BLOOD SITE NOT SPECIFIED  Final   Special Requests   Final    BOTTLES DRAWN AEROBIC AND ANAEROBIC Blood Culture adequate volume   Culture   Final    NO GROWTH 5  DAYS Performed at [facility], [address] [place], [place] [address]    Report Status 11/08/2022 FINAL  Final  Blood Culture (routine x 2)     Status: None   Collection Time: 11/03/22  7:28 PM   Specimen: BLOOD  Result Value Ref Range Status   Specimen Description BLOOD SITE NOT SPECIFIED  Final   Special Requests   Final    BOTTLES DRAWN AEROBIC AND ANAEROBIC Blood Culture adequate volume   Culture   Final    NO GROWTH 5 DAYS Performed at [facility], [address] [place], [place] [address]    Report Status 11/08/2022 FINAL  Final  Resp panel by RT-PCR (RSV, Flu A&B, Covid) Anterior Nasal Swab     Status: None   Collection Time: 11/03/22  8:50 PM   Specimen: Anterior Nasal Swab  Result Value Ref Range Status   SARS Coronavirus 2 by RT PCR NEGATIVE NEGATIVE Final   Influenza A by PCR NEGATIVE NEGATIVE Final   Influenza B by PCR NEGATIVE NEGATIVE Final    Comment: (NOTE) The Xpert Xpress SARS-CoV-2/FLU/RSV plus assay is intended as an aid in the diagnosis of influenza from Nasopharyngeal swab specimens and should not be used as a sole basis for treatment. Nasal washings and aspirates are unacceptable for Xpert Xpress SARS-CoV-2/FLU/RSV testing.  Fact Sheet for Patients: BloggerCourse.com  Fact Sheet for Healthcare Providers: SeriousBroker.it  This test is not yet approved or cleared by the [place] FDA and has been authorized for detection and/or diagnosis of SARS-CoV-2 by FDA under an Emergency Use Authorization (EUA). This EUA will remain in effect (meaning this test can be used) for the duration of the COVID-19 declaration under Section 564(b)(1) of the Act, 21 U.S.C. section 360bbb-3(b)(1), unless the authorization is terminated or revoked.     Resp Syncytial Virus by PCR NEGATIVE NEGATIVE Final    Comment: (NOTE) Fact Sheet for Patients: BloggerCourse.com  Fact  Sheet for Healthcare Providers: SeriousBroker.it  This test is not yet approved or cleared by the [place] FDA and has been authorized for detection and/or diagnosis of SARS-CoV-2 by FDA under an Emergency Use Authorization (EUA). This EUA will remain in effect (meaning this test can be used) for the duration of the COVID-19 declaration under Section 564(b)(1) of the Act, 21  U.S.C. section 360bbb-3(b)(1), unless the authorization is terminated or revoked.  Performed at [facility], [address], [place], [place] [address]      Labs: BNP (last 3 results) No results for input(s): "BNP" in the last 8760 hours. Basic Metabolic Panel: Recent Labs  Lab 11/03/22 1925 11/04/22 0620 11/05/22 0817 11/06/22 0727  NA 137 133* 137 139  K 4.9 3.6 3.3* 3.8  CL 101 97* 102 105  CO2 18* 24 25 28   GLUCOSE 259* 202* 90 125*  BUN 33* 21* 13 7  CREATININE 1.56* 1.09* 1.14* 1.13*  CALCIUM 9.5 8.9 8.6* 8.3*  MG  --  1.4* 1.7  --   PHOS  --   --  2.1*  --    Liver Function Tests: Recent Labs  Lab 11/03/22 1925  AST 19  ALT 15  ALKPHOS 121  BILITOT 0.7  PROT 8.7*  ALBUMIN 3.5   No results for input(s): "LIPASE", "AMYLASE" in the last 168 hours. No results for input(s): "AMMONIA" in the last 168 hours. CBC: Recent Labs  Lab 11/03/22 1925 11/04/22 0620 11/05/22 0817 11/06/22 0727  WBC 13.8* 15.0* 14.6* 11.3*  NEUTROABS 9.9*  --   --   --   HGB 13.7 13.2 12.3 11.8*  HCT 41.2 39.1 36.8 35.2*  MCV 84.4 86.3 87.0 88.4  PLT 320 271 242 230   Cardiac Enzymes: No results for input(s): "CKTOTAL", "CKMB", "CKMBINDEX", "TROPONINI" in the last 168 hours. BNP: Invalid input(s): "POCBNP" CBG: Recent Labs  Lab 11/07/22 0831 11/07/22 1221 11/07/22 1704 11/07/22 2100 11/08/22 0831  GLUCAP 144* 206* 219* 131* 143*   D-Dimer No results for input(s): "DDIMER" in the last 72 hours. Hgb A1c No results for input(s): "HGBA1C" in the last 72  hours. Lipid Profile No results for input(s): "CHOL", "HDL", "LDLCALC", "TRIG", "CHOLHDL", "LDLDIRECT" in the last 72 hours. Thyroid function studies Recent Labs    11/06/22 0727  TSH 1.416   Anemia work up No results for input(s): "VITAMINB12", "FOLATE", "FERRITIN", "TIBC", "IRON", "RETICCTPCT" in the last 72 hours. Urinalysis    Component Value Date/Time   COLORURINE YELLOW 11/04/2022 0038   APPEARANCEUR CLEAR 11/04/2022 0038   LABSPEC 1.036 (H) 11/04/2022 0038   PHURINE 5.0 11/04/2022 0038   GLUCOSEU 50 (A) 11/04/2022 0038   HGBUR NEGATIVE 11/04/2022 0038   HGBUR negative 01/08/2010 1045   BILIRUBINUR NEGATIVE 11/04/2022 0038   BILIRUBINUR Negative 03/27/2022 1536   KETONESUR 20 (A) 11/04/2022 0038   PROTEINUR 30 (A) 11/04/2022 0038   UROBILINOGEN 0.2 (A) 03/27/2022 1536   UROBILINOGEN 1.0 11/14/2013 0741   NITRITE NEGATIVE 11/04/2022 0038   LEUKOCYTESUR TRACE (A) 11/04/2022 0038   Sepsis Labs Recent Labs  Lab 11/03/22 1925 11/04/22 0620 11/05/22 0817 11/06/22 0727  WBC 13.8* 15.0* 14.6* 11.3*   Microbiology Recent Results (from the past 240 hour(s))  Culture, blood (single)     Status: None   Collection Time: 11/03/22  7:25 PM   Specimen: BLOOD  Result Value Ref Range Status   Specimen Description BLOOD SITE NOT SPECIFIED  Final   Special Requests   Final    BOTTLES DRAWN AEROBIC AND ANAEROBIC Blood Culture adequate volume   Culture   Final    NO GROWTH 5 DAYS Performed at [facility], [address], [place], [place] [address]    Report Status 11/08/2022 FINAL  Final  Blood Culture (routine x 2)     Status: None   Collection Time: 11/03/22  7:28 PM   Specimen: BLOOD  Result Value Ref Range Status   Specimen Description BLOOD SITE NOT SPECIFIED  Final   Special Requests   Final    BOTTLES DRAWN AEROBIC AND ANAEROBIC Blood Culture adequate volume   Culture   Final    NO GROWTH 5 DAYS Performed at [facility], [address],  [place], [place] [address]    Report Status 11/08/2022 FINAL  Final  Resp panel by RT-PCR (RSV, Flu A&B, Covid) Anterior Nasal Swab     Status: None   Collection Time: 11/03/22  8:50 PM   Specimen: Anterior Nasal Swab  Result Value Ref Range Status   SARS Coronavirus 2 by RT PCR NEGATIVE NEGATIVE Final   Influenza A by PCR NEGATIVE NEGATIVE Final   Influenza B by PCR NEGATIVE NEGATIVE Final    Comment: (NOTE) The Xpert Xpress SARS-CoV-2/FLU/RSV plus assay is intended as an aid in the diagnosis of influenza from Nasopharyngeal swab specimens and should not be used as a sole basis for treatment. Nasal washings and aspirates are unacceptable for Xpert Xpress SARS-CoV-2/FLU/RSV testing.  Fact Sheet for Patients: BloggerCourse.com  Fact Sheet for Healthcare Providers: SeriousBroker.it  This test is not yet approved or cleared by the [place] FDA and has been authorized for detection and/or diagnosis of SARS-CoV-2 by FDA under an Emergency Use Authorization (EUA). This EUA will remain in effect (meaning this test can be used) for the duration of the COVID-19 declaration under Section 564(b)(1) of the Act, 21 U.S.C. section 360bbb-3(b)(1), unless the authorization is terminated or revoked.     Resp Syncytial Virus by PCR NEGATIVE NEGATIVE Final    Comment: (NOTE) Fact Sheet for Patients: BloggerCourse.com  Fact Sheet for Healthcare Providers: SeriousBroker.it  This test is not yet approved or cleared by the [place] FDA and has been authorized for detection and/or diagnosis of SARS-CoV-2 by FDA under an Emergency Use Authorization (EUA). This EUA will remain in effect (meaning this test can be used) for the duration of the COVID-19 declaration under Section 564(b)(1) of the Act, 21 U.S.C. section 360bbb-3(b)(1), unless the authorization is terminated or revoked.  Performed at  [facility], [address] [place], [place] [address]      Time coordinating discharge:  35 minutes  SIGNED:   Dorcas Carrow, MD  [facility] 11/08/2022, 10:48 AM

## 2022-11-09 ENCOUNTER — Encounter (HOSPITAL_COMMUNITY): Payer: Self-pay | Admitting: Internal Medicine

## 2022-11-10 ENCOUNTER — Telehealth: Payer: Self-pay

## 2022-11-10 NOTE — Transitions of Care (Post Inpatient/ED Visit) (Signed)
11/10/2022  Name: [name] MRN: [id number] DOB: Sep 03, 1965  Today's TOC FU Call Status: Today's TOC FU Call Status:: Successful TOC FU Call Completed TOC FU Call Complete Date: 11/10/22 Patient's Name and Date of Birth confirmed.  Transition Care Management Follow-up Telephone Call Date of Discharge: 11/08/22 Discharge Facility: [facility]) Type of Discharge: Inpatient Admission Primary Inpatient Discharge Diagnosis:: abd pain How have you been since you were released from the hospital?: Same Any questions or concerns?: No  Items Reviewed: Did you receive and understand the discharge instructions provided?: Yes Medications obtained,verified, and reconciled?: Yes (Medications Reviewed) Any new allergies since your discharge?: No Dietary orders reviewed?: Yes Do you have support at home?: Yes People in Home: child(ren), adult  Medications Reviewed Today: Medications Reviewed Today     Reviewed by Karena Addison, LPN (Licensed Practical Nurse) on 11/10/22 at 1050  Med List Status: <None>   Medication Order Taking? Sig Documenting Provider Last Dose Status Informant  albuterol (VENTOLIN HFA) 108 (90 Base) MCG/ACT inhaler [id number] No Inhale 2 puffs into the lungs every 6 (six) hours as needed for wheezing or shortness of breath.  Patient taking differently: Inhale 2 puffs into the lungs 3 (three) times daily as needed for wheezing or shortness of breath.   Saguier, Ramon Dredge, PA-C unk Active Self, Pharmacy Records  amLODipine (NORVASC) 5 MG tablet [id number] No Take 1 tablet (5 mg total) by mouth daily. Saguier, Kateri Mc Past Week Active Self, Pharmacy Records  aspirin 81 MG tablet [id number] No Take 81 mg by mouth daily. [provider] Past Week Active Self, Pharmacy Records  atorvastatin (LIPITOR) 80 MG tablet [id number] No Take 1 tablet (80 mg total) by mouth daily. Saguier, Kateri Mc Past Week Active Self, Pharmacy Records  baclofen (LIORESAL) 10 MG  tablet [id number] No Take 1 tablet (10 mg total) by mouth 3 (three) times daily. Carlisle Beers, FNP Past Week Active Self, Pharmacy Records  busPIRone (BUSPAR) 15 MG tablet [id number] No Take 1 tablet (15 mg total) by mouth 2 (two) times daily.  Patient taking differently: Take 15 mg by mouth daily as needed (anxiety).   Saguier, Ramon Dredge, PA-C unk Active Self, Pharmacy Records  carvedilol (COREG) 25 MG tablet [id number] No Take 1 tablet (25 mg total) by mouth 2 (two) times daily with a meal. Saguier, Ramon Dredge, PA-C Past Week Active Self, Pharmacy Records  Continuous Glucose Sensor (FREESTYLE LIBRE 3 SENSOR) [place] [id number] No USE AS DIRECTED TO MONITOR BLOOD SUGAR. CHANGE EVERY 14 DAYS [provider] Taking Active Self, Pharmacy Records  fluticasone (FLONASE) 50 MCG/ACT nasal spray [id number] No Place 2 sprays into both nostrils daily. Saguier, Ramon Dredge, PA-C Past Week Active Self, Pharmacy Records  fluticasone furoate-vilanterol (BREO ELLIPTA) 100-25 MCG/INH AEPB [id number] No Inhale 1 puff into the lungs daily. Saguier, Kateri Mc Past Week Active Self, Pharmacy Records  gabapentin (NEURONTIN) 300 MG capsule [id number] No TAKE 2 CAPSULES BY MOUTH THREE TIMES DAILY Saguier, Ramon Dredge, PA-C Past Week Active Self, Pharmacy Records  Insulin Glargine [facility]) 100 UNIT/ML [id number] No INJECT 40 UNITS SUBCUTANEOUSLY AT BEDTIME Saguier, Ramon Dredge, PA-C Past Week Active Self, Pharmacy Records  Insulin Pen Needle (PEN NEEDLES 31GX5/16") 31G X 8 MM MISC [id number] No Use daily as directed Saguier, Ramon Dredge, PA-C Taking Active Self, Pharmacy Records  lidocaine (LIDODERM) 5 % [id number]  Place 1 patch onto the skin daily. Remove & Discard patch within 12 hours or as directed by MD Dorcas Carrow, MD  Active   metFORMIN (GLUCOPHAGE-XR) 500  MG 24 hr tablet [id number] No Take 1 tablet (500 mg total) by mouth daily with breakfast. Saguier, Ramon Dredge, PA-C Past Week Active Self, Pharmacy Records  metoCLOPramide  (REGLAN) 5 MG tablet [id number]  Take 1 tablet (5 mg total) by mouth 3 (three) times daily before meals. Dorcas Carrow, MD  Active   naproxen (NAPROSYN) 250 MG tablet [id number]  Take 1 tablet (250 mg total) by mouth 2 (two) times daily with a meal for 5 days. Dorcas Carrow, MD  Active   nitroGLYCERIN (NITROSTAT) 0.4 MG SL tablet [id number] No Place 1 tablet (0.4 mg total) under the tongue every 5 (five) minutes as needed for chest pain. Saguier, Ramon Dredge, PA-C unk Active Self, Pharmacy Records  ondansetron [facility]) 4 MG tablet [id number] No Take 4 mg by mouth every 8 (eight) hours as needed for nausea or vomiting. [provider] unk Active Self, Pharmacy Records  Oxycodone HCl 10 MG TABS [id number]  Take 1 tablet (10 mg total) by mouth every 6 (six) hours as needed. 1 tab po bid prn severe pain. Dorcas Carrow, MD  Active   OZEMPIC, 0.25 OR 0.5 MG/DOSE, 2 MG/3ML SOPN [id number] No Inject 0.5 mg into the skin once a week. mondays [provider] Past Week Active Self, Pharmacy Records  pantoprazole (PROTONIX) 40 MG tablet [id number]  Take 1 tablet (40 mg total) by mouth 2 (two) times daily. Dorcas Carrow, MD  Active   sucralfate (CARAFATE) 1 g tablet [id number]  Take 1 tablet (1 g total) by mouth 4 (four) times daily. Dorcas Carrow, MD  Active   topiramate (TOPAMAX) 50 MG tablet [id number] No Take 1 tablet (50 mg total) by mouth 2 (two) times daily.  Patient taking differently: Take 50 mg by mouth 2 (two) times daily as needed (headache).   Saguier, Kateri Mc unk Active Self, Pharmacy Records  VITAMIN D PO [id number] No Take 5,000 Units by mouth daily. [provider] Past Week Active Self, Pharmacy Records            Home Care and Equipment/Supplies: Were Home Health Services Ordered?: NA Any new equipment or medical supplies ordered?: NA  Functional Questionnaire: Do you need assistance with bathing/showering or dressing?: No Do you need assistance with meal  preparation?: No Do you need assistance with eating?: No Do you have difficulty maintaining continence: No Do you need assistance with getting out of bed/getting out of a chair/moving?: No Do you have difficulty managing or taking your medications?: No  Follow up appointments reviewed: PCP Follow-up appointment confirmed?: Yes Date of PCP follow-up appointment?: 11/12/22 Follow-up Provider: [facility] Follow-up appointment confirmed?: NA Do you need transportation to your follow-up appointment?: No Do you understand care options if your condition(s) worsen?: Yes-patient verbalized understanding    SIGNATURE Karena Addison, LPN [facility] Nurse Health Advisor Direct Dial [phone]

## 2022-11-11 ENCOUNTER — Telehealth: Payer: Self-pay | Admitting: *Deleted

## 2022-11-11 NOTE — Transitions of Care (Post Inpatient/ED Visit) (Signed)
11/11/2022  Name: [name] MRN: [id number] DOB: 22-Jul-1965  Today's TOC FU Call Status: Today's TOC FU Call Status:: Unsuccessful Call (1st Attempt) Unsuccessful Call (1st Attempt) Date: 11/11/22 (call post- [facility] call on 11/10/22 to offer RN CM TOC Program enrollment)  Attempted to reach the patient regarding the most recent Inpatient visit; left HIPAA compliant voice message requesting call back  Follow Up Plan: Additional outreach attempts will be made to reach the patient to complete the Transitions of Care (Post Inpatient visit) call.   Caryl Pina, RN, BSN, Media planner  Transitions of Care  [facility] - [facility] [phone]: direct office

## 2022-11-12 ENCOUNTER — Ambulatory Visit (HOSPITAL_BASED_OUTPATIENT_CLINIC_OR_DEPARTMENT_OTHER)
Admission: RE | Admit: 2022-11-12 | Discharge: 2022-11-12 | Disposition: A | Discharge status: Home / Self Care | Payer: Managed Care, Other (non HMO) | Source: Ambulatory Visit | Attending: Medical | Admitting: Medical

## 2022-11-12 ENCOUNTER — Telehealth: Payer: Self-pay | Admitting: *Deleted

## 2022-11-12 ENCOUNTER — Other Ambulatory Visit: Payer: Self-pay | Admitting: Medical

## 2022-11-12 ENCOUNTER — Ambulatory Visit (INDEPENDENT_AMBULATORY_CARE_PROVIDER_SITE_OTHER): Payer: Managed Care, Other (non HMO) | Admitting: Medical

## 2022-11-12 VITALS — BP 117/76 | HR 95 | Temp 98.0°F | Resp 16 | Ht 73.0 in | Wt 174.0 lb

## 2022-11-12 DIAGNOSIS — R1013 Epigastric pain: Secondary | ICD-10-CM

## 2022-11-12 DIAGNOSIS — R0781 Pleurodynia: Secondary | ICD-10-CM | POA: Diagnosis present

## 2022-11-12 DIAGNOSIS — R112 Nausea with vomiting, unspecified: Secondary | ICD-10-CM

## 2022-11-12 DIAGNOSIS — K529 Noninfective gastroenteritis and colitis, unspecified: Secondary | ICD-10-CM

## 2022-11-12 DIAGNOSIS — G8929 Other chronic pain: Secondary | ICD-10-CM

## 2022-11-12 DIAGNOSIS — Z79899 Other long term (current) drug therapy: Secondary | ICD-10-CM

## 2022-11-12 DIAGNOSIS — R1032 Left lower quadrant pain: Secondary | ICD-10-CM

## 2022-11-12 DIAGNOSIS — M5489 Other dorsalgia: Secondary | ICD-10-CM

## 2022-11-12 LAB — CBC WITH DIFFERENTIAL/PLATELET
Basophils Absolute: 0.1 10*3/uL (ref 0.0–0.1)
Basophils Relative: 1.1 % (ref 0.0–3.0)
Eosinophils Absolute: 2.8 10*3/uL — ABNORMAL HIGH (ref 0.0–0.7)
Eosinophils Relative: 34.2 % — ABNORMAL HIGH (ref 0.0–5.0)
HCT: 38.3 % (ref 36.0–46.0)
Hemoglobin: 12.4 g/dL (ref 12.0–15.0)
Lymphocytes Relative: 26.3 % (ref 12.0–46.0)
Lymphs Abs: 2.1 10*3/uL (ref 0.7–4.0)
MCHC: 32.4 g/dL (ref 30.0–36.0)
MCV: 88.9 fL (ref 78.0–100.0)
Monocytes Absolute: 0.6 10*3/uL (ref 0.1–1.0)
Monocytes Relative: 6.9 % (ref 3.0–12.0)
Neutro Abs: 2.5 10*3/uL (ref 1.4–7.7)
Neutrophils Relative %: 31.5 % — ABNORMAL LOW (ref 43.0–77.0)
Platelets: 334 10*3/uL (ref 150.0–400.0)
RBC: 4.31 Mil/uL (ref 3.87–5.11)
RDW: 14.7 % (ref 11.5–15.5)
WBC: 8.1 10*3/uL (ref 4.0–10.5)

## 2022-11-12 LAB — COMPREHENSIVE METABOLIC PANEL
ALT: 13 U/L (ref 0–35)
AST: 15 U/L (ref 0–37)
Albumin: 3.7 g/dL (ref 3.5–5.2)
Alkaline Phosphatase: 100 U/L (ref 39–117)
BUN: 19 mg/dL (ref 6–23)
CO2: 33 meq/L — ABNORMAL HIGH (ref 19–32)
Calcium: 9.2 mg/dL (ref 8.4–10.5)
Chloride: 101 meq/L (ref 96–112)
Creatinine, Ser: 1.26 mg/dL — ABNORMAL HIGH (ref 0.40–1.20)
GFR: 47.27 mL/min — ABNORMAL LOW (ref >60.00)
Glucose, Bld: 124 mg/dL — ABNORMAL HIGH (ref 70–99)
Potassium: 4.2 meq/L (ref 3.5–5.1)
Sodium: 142 meq/L (ref 135–145)
Total Bilirubin: 0.3 mg/dL (ref 0.2–1.2)
Total Protein: 7.2 g/dL (ref 6.0–8.3)

## 2022-11-12 LAB — MAGNESIUM: Magnesium: 1.4 mg/dL — ABNORMAL LOW (ref 1.5–2.5)

## 2022-11-12 LAB — LIPASE: Lipase: 66 U/L — ABNORMAL HIGH (ref 11.0–59.0)

## 2022-11-12 MED ORDER — OXYCODONE HCL 10 MG PO TABS: ORAL_TABLET | ORAL | 0 refills | Status: DC

## 2022-11-12 NOTE — Transitions of Care (Post Inpatient/ED Visit) (Signed)
11/12/2022  Name: [name] MRN: [id number] DOB: 1965-12-30  Today's TOC FU Call Status: Today's TOC FU Call Status:: Successful TOC FU Call Completed TOC FU Call Complete Date: 11/12/22 Patient's Name and Date of Birth confirmed.  Transition Care Management Follow-up Telephone Call Date of Discharge: 11/08/22 Discharge Facility: [facility]) Type of Discharge: Inpatient Admission Primary Inpatient Discharge Diagnosis:: abdominal pain; acute renal failure/ SIRS; endoscopy How have you been since you were released from the hospital?: Better ("I am still at the doctos office getting everything done; he reviewed my medicines and so did the nurse that called me the other day.  I am better.  I don't want or need a nurse calling me every week- i'll call the doctor if I have any problems") Any questions or concerns?: No  Items Reviewed: Did you receive and understand the discharge instructions provided?: Yes (briefly reviewed with patient who verbalizes good understanding of same - had previous TOC call by [facility]/ LPN and is currently at PCP office for HFU OV) Medications obtained,verified, and reconciled?: No Medications Not Reviewed Reasons:: Other: (had previous TOC call by [facility]/ LPN and is currently at PCP office for HFU OV- states meds have been reviewed already) Any new allergies since your discharge?: No Dietary orders reviewed?: No (patient declined- currently still at PCP office from 10:20 am scheduled HFU OV) Do you have support at home?: Yes People in Home: child(ren), adult Name of Support/Comfort Primary Source: Reports independent in self-care activities; resides with supportive adult son-- assists as/ if needed/ indicated  Medications Reviewed Today: Medications Reviewed Today     Reviewed by Michaela Corner, RN (Registered Nurse) on 11/12/22 at 1215  Med List Status: <None>   Medication Order Taking? Sig Documenting Provider Last Dose Status Informant  albuterol  (VENTOLIN HFA) 108 (90 Base) MCG/ACT inhaler [id number] No Inhale 2 puffs into the lungs every 6 (six) hours as needed for wheezing or shortness of breath.  Patient taking differently: Inhale 2 puffs into the lungs 3 (three) times daily as needed for wheezing or shortness of breath.   Saguier, Ramon Dredge, PA-C Taking Active Self, Pharmacy Records  amLODipine (NORVASC) 5 MG tablet [id number] No Take 1 tablet (5 mg total) by mouth daily. Saguier, Ramon Dredge, PA-C Taking Active Self, Pharmacy Records  aspirin 81 MG tablet [id number] No Take 81 mg by mouth daily. [provider] Taking Active Self, Pharmacy Records  atorvastatin (LIPITOR) 80 MG tablet [id number] No Take 1 tablet (80 mg total) by mouth daily. Saguier, Ramon Dredge, PA-C Taking Active Self, Pharmacy Records  baclofen (LIORESAL) 10 MG tablet [id number] No Take 1 tablet (10 mg total) by mouth 3 (three) times daily. Carlisle Beers, FNP Taking Active Self, Pharmacy Records  busPIRone (BUSPAR) 15 MG tablet [id number] No Take 1 tablet (15 mg total) by mouth 2 (two) times daily.  Patient taking differently: Take 15 mg by mouth daily as needed (anxiety).   Saguier, Ramon Dredge, PA-C Taking Active Self, Pharmacy Records  carvedilol (COREG) 25 MG tablet [id number] No Take 1 tablet (25 mg total) by mouth 2 (two) times daily with a meal. Saguier, Ramon Dredge, PA-C Taking Active Self, Pharmacy Records  Continuous Glucose Sensor (FREESTYLE LIBRE 3 SENSOR) [place] [id number] No USE AS DIRECTED TO MONITOR BLOOD SUGAR. CHANGE EVERY 14 DAYS [provider] Taking Active Self, Pharmacy Records  fluticasone (FLONASE) 50 MCG/ACT nasal spray [id number] No Place 2 sprays into both nostrils daily. Saguier, Ramon Dredge, PA-C Taking Active Self, Pharmacy Records  fluticasone furoate-vilanterol (  BREO ELLIPTA) 100-25 MCG/INH AEPB [id number] No Inhale 1 puff into the lungs daily. Saguier, Ramon Dredge, PA-C Taking Active Self, Pharmacy Records  gabapentin (NEURONTIN) 300 MG capsule [id number]  No TAKE 2 CAPSULES BY MOUTH THREE TIMES DAILY Saguier, Ramon Dredge, PA-C Taking Active Self, Pharmacy Records  Insulin Glargine [facility]) 100 UNIT/ML [id number] No INJECT 40 UNITS SUBCUTANEOUSLY AT BEDTIME Saguier, Ramon Dredge, PA-C Taking Active Self, Pharmacy Records  Insulin Pen Needle (PEN NEEDLES 31GX5/16") 31G X 8 MM MISC [id number] No Use daily as directed Saguier, Ramon Dredge, PA-C Taking Active Self, Pharmacy Records  lidocaine (LIDODERM) 5 % [id number] No Place 1 patch onto the skin daily. Remove & Discard patch within 12 hours or as directed by MD Dorcas Carrow, MD Taking Active   metFORMIN (GLUCOPHAGE-XR) 500 MG 24 hr tablet [id number] No Take 1 tablet (500 mg total) by mouth daily with breakfast. Saguier, Ramon Dredge, PA-C Taking Active Self, Pharmacy Records  metoCLOPramide (REGLAN) 5 MG tablet [id number] No Take 1 tablet (5 mg total) by mouth 3 (three) times daily before meals. Dorcas Carrow, MD Taking Active   naproxen (NAPROSYN) 250 MG tablet [id number] No Take 1 tablet (250 mg total) by mouth 2 (two) times daily with a meal for 5 days. Dorcas Carrow, MD Taking Active   nitroGLYCERIN (NITROSTAT) 0.4 MG SL tablet [id number] No Place 1 tablet (0.4 mg total) under the tongue every 5 (five) minutes as needed for chest pain. Saguier, Ramon Dredge, PA-C Taking Active Self, Pharmacy Records  ondansetron [facility]) 4 MG tablet [id number] No Take 4 mg by mouth every 8 (eight) hours as needed for nausea or vomiting. [provider] Taking Active Self, Pharmacy Records  Oxycodone HCl 10 MG TABS [id number]  1 tab po bid prn severe pain. Saguier, Ramon Dredge, PA-C  Active   OZEMPIC, 0.25 OR 0.5 MG/DOSE, 2 MG/3ML SOPN [id number] No Inject 0.5 mg into the skin once a week. mondays [provider] Taking Active Self, Pharmacy Records  pantoprazole (PROTONIX) 40 MG tablet [id number] No Take 1 tablet (40 mg total) by mouth 2 (two) times daily. Dorcas Carrow, MD Taking Active   sucralfate (CARAFATE) 1 g tablet  [id number] No Take 1 tablet (1 g total) by mouth 4 (four) times daily. Dorcas Carrow, MD Taking Active   topiramate (TOPAMAX) 50 MG tablet [id number] No Take 1 tablet (50 mg total) by mouth 2 (two) times daily.  Patient taking differently: Take 50 mg by mouth 2 (two) times daily as needed (headache).   Saguier, Kateri Mc Taking Active Self, Pharmacy Records  VITAMIN D PO [id number] No Take 5,000 Units by mouth daily. [provider] Taking Active Self, Pharmacy Records           Home Care and Equipment/Supplies: Were Home Health Services Ordered?: No Any new equipment or medical supplies ordered?: No  Functional Questionnaire: Do you need assistance with bathing/showering or dressing?: No Do you need assistance with meal preparation?: No Do you need assistance with eating?: No Do you have difficulty maintaining continence: No Do you need assistance with getting out of bed/getting out of a chair/moving?: No Do you have difficulty managing or taking your medications?: No  Follow up appointments reviewed: PCP Follow-up appointment confirmed?: Yes Date of PCP follow-up appointment?: 11/12/22 Follow-up Provider: PCP- verified attended as scheduled-- today at 10:20 am Specialist Hospital Follow-up appointment confirmed?: Yes Date of Specialist follow-up appointment?: 12/25/22 Follow-Up Specialty Provider:: GI provider Do you need transportation to your follow-up appointment?: No Do you understand care options if your condition(s) worsen?:  Yes-patient verbalized understanding  SDOH Interventions Today    Flowsheet Row Most Recent Value  SDOH Interventions   Food Insecurity Interventions Intervention Not Indicated  [noted previous alert for food insecurity- today, patient denies food insecurity and declines my offer to have [employer] Care Guide call to provide resources]  Transportation Interventions Intervention Not Indicated  [reports drives self]      TOC  Interventions Today    Flowsheet Row Most Recent Value  TOC Interventions   TOC Interventions Discussed/Reviewed TOC Interventions Discussed  [Patient declines need for ongoing/ further care management/ coordination outreach- declines enrollment in 30-day TOC program- declines taking my direct phone number should needs/ concerns arise post-TOC call]      Interventions Today    Flowsheet Row Most Recent Value  Chronic Disease   Chronic disease during today's visit Other  [SIRS/ Acute renal failure/ abdominal pain]  General Interventions   General Interventions Discussed/Reviewed General Interventions Discussed, Doctor Visits  Doctor Visits Discussed/Reviewed Doctor Visits Discussed, Doctor Visits Reviewed, PCP  Kingsley Callander reviewed today's PCP HFU OV: confirmed patient denies questions post- office visit]  PCP/Specialist Visits Compliance with follow-up visit      Caryl Pina, RN, BSN, Media planner  Transitions of Care  [facility] - Population Health  [place] [phone]: direct office

## 2022-11-12 NOTE — Progress Notes (Signed)
Subjective:    Patient ID: [name], female    DOB: 1965/09/13, 57 y.o.   MRN: [id number]  HPI  Pt in for hospital follow up. She was admitted.   Admit date: 11/03/2022 Discharge date: 11/08/2022   Admitted From: Home Disposition: Home   Recommendations for Outpatient Follow-up:  Follow up with PCP in 1-2 weeks Please obtain BMP/CBC in one week GI office will schedule follow-up  Discharge Condition: Stable CODE STATUS: Full code Diet recommendation: Low-salt and low-carb diet, gastroparesis diet.  Frequent small meals.   Discharge summary: 57 year old with history of neuropathy and chronic pain issues on outpatient pain management, hypertension, hyperlipidemia, type 2 diabetes on insulin, CKD stage IIIa, history of peptic ulcer disease and anxiety presented with 3 days of abdominal pain, nausea and intractable vomiting without any aggravating factors. Unable to eat anything so came to the ER. in the emergency room was initially tachycardic, blood pressure 90. WBC 13.8, lactic acid 5.4. CT scan abdomen pelvis without significant findings except pyloric wall thickening. Patient with history of EGD and gastric emptying test that was normal in the past.  9/26, upper GI endoscopy with evidence of retained food.  No evidence of peptic ulcer disease.  She was found to have gastroparesis symptoms.  She also has unrelated to gastroparesis, left lower rib pain consistent with musculoskeletal pain.   # Intractable nausea vomiting, upper abdominal pain and abnormal CT scan:  Gastroparesis symptoms.  EGD with no evidence of peptic ulcer disease. Today patient tolerated soft meal along with use of Reglan. Patient be discharged home with Reglan 5 mg 3 times daily with meals, Protonix 40 mg twice daily, sucralfate 1 g 4 times daily.  GI will schedule follow-up. Discussed gastroparesis diet. Upper GI endoscopy as above.   # Left upper quadrant abdominal pain: Patient has point  tenderness along the left floating ribs.  No palpable fracture.  Probably musculoskeletal injury from nausea.  She remained in the hospital with persistent pain. Patient on gabapentin at home, continued On oxycodone 10 mg twice daily at home, currently on 10 mg every 6 hours.  Will give a short course of therapy. There was no evidence of peptic ulcer disease, will add Naprosyn to 50 mg twice daily for 5 days. Lidocaine patch. Patient can use muscle relaxants, she has baclofen at home.  Advised to avoid driving with multiple medications that can be potentially sedating.   AKI CKD stage IIIa: Baseline creatinine about 1.1.  Stabilized.   Lactic acidosis tachycardia secondary to dehydration: No evidence of infection.  Improved with IV fluids.  Asymptomatic today.   Type 2 diabetes with hyperglycemia: Well-controlled today in the hospital.  At home patient takes metformin, Ozempic and 40 units of insulin at bedtime.  Her appetite has improved.  Go back on home dose of insulin.   Hypertension: Blood pressure stable on amlodipine and carvedilol.   Hypokalemia Hypomagnesemia Hypophosphatemia Secondary to poor intake.  Replaced with improvement.   Stable for discharge.     Discharge Diagnoses:  Principal Problem:   Acute renal failure superimposed on stage 3a chronic kidney disease (HCC) Active Problems:   Uncontrolled type 2 diabetes mellitus with hyperglycemia, without long-term current use of insulin (HCC)   PUD (peptic ulcer disease)   Abdominal pain with vomiting   Anxiety   Hyperlipidemia   Hypertension   SIRS (systemic inflammatory response syndrome) (HCC)   Coffee ground emesis   Abnormal CT scan   IMPRESSION: 1. Possible mild pyloric  wall thickening, correlate for peptic ulcer disease/gastritis. 2. Otherwise no CT evidence for acute intra-abdominal or pelvic abnormality. 3. Aortic atherosclerosis.  Egd- mucosal variant biopsied and pt given reglan.  Pt given reglan.  She is still on reglan. The nausea is stil there to some degree. Vomiting little bit on Monday. But now no vomiting with phenergan.  Pt never had a colonoscopy. Pt states she is scheduled for colonosocpy in novemver.  Pt sees endocrinologist and endocrinologist put her on ozempic.  On review  pt had piror elevated lipase.It has not been on ozempic. Pt has not been on ozempic since 10-27-2022.  A. DUODENUM, BIOPSY:  Benign duodenal mucosa with no diagnostic abnormality   B. STOMACH, BIOPSY:  Reactive gastropathy with mild chronic gastritis  Negative for H. pylori, intestinal metaplasia, dysplasia and carcinoma  (see comment)   C. ESOPHAGUS, BIOPSY:  Reactive squamous mucosa with changes suggestive of reflux esophagitis  Negative for glandular epithelium, eosinophils, dysplasia and carcinoma    In addition to luq area pain which she states has been presnt since admision she does note left lower rib pain. Is tender on direct palpation. No fall or injury to rib reported.   Review of Systems  Constitutional:  Negative for chills, fatigue and fever.  Respiratory:  Negative for cough, chest tightness, shortness of breath and wheezing.   Cardiovascular:  Negative for chest pain and palpitations.  Gastrointestinal:  Positive for abdominal pain, nausea and vomiting. Negative for abdominal distention, constipation and diarrhea.  Musculoskeletal:  Positive for back pain.  Skin:  Negative for rash.  Neurological:  Negative for dizziness, syncope, weakness, light-headedness and numbness.  Hematological:  Negative for adenopathy. Does not bruise/bleed easily.  Psychiatric/Behavioral:  Negative for behavioral problems and confusion.     Past Medical History:  Diagnosis Date   Abnormal Pap smear of cervix    Anxiety 01/22/2016   Asthma    Cardiac murmur 02/01/2019   Cardiomyopathy, secondary (HCC) 09/06/2009   Qualifier: Diagnosis of  By: Jolene Provost   Formatting of this note  might be different from the original. Overview:  Qualifier: Diagnosis of  By: Jolene Provost   Cervical cancer [facility]) 2001   Radical hysterectomy in [facility]   Chronic kidney disease, stage III (moderate) (HCC) 05/23/2014   Chronic pain syndrome 01/23/2016   Coronary artery disease    30% lesions noted   Depression    Diabetes type 2, uncontrolled 05/23/2014   Diabetic gastroparesis associated with type 1 diabetes mellitus (HCC) 11/27/2016   DIABETIC PERIPHERAL NEUROPATHY 09/21/2009   Qualifier: Diagnosis of  By: Delrae Alfred MD, [name]     Essential hypertension 08/30/2009   Qualifier: Diagnosis of  By: Cristela Felt, CNA, Christy     Gastroesophageal reflux disease 07/30/2009   Formatting of this note might be different from the original. Overview:  Overview:  Qualifier: Diagnosis of  By: Delrae Alfred MD, Alfonso Patten: Diagnosis of  By: Wilmon Pali NP, Consuela Mimes of this note might be different from the original. Overview:  Qualifier: Diagnosis of  By: Delrae Alfred MD, Alfonso Patten: Diagnosis of  By: Wilmon Pali NP, Gunnar Fusi   GERD (gastroesophageal reflux disease)    History of cervical cancer 08/17/2015   Status post partial hysterectomy  Formatting of this note might be different from the original. Status post partial hysterectomy   Hyperlipidemia    INSOMNIA 09/21/2009   Qualifier: Diagnosis of  By: Delrae Alfred MD, [name]     Lumbar facet arthropathy  05/14/2016   Lumbar spinal stenosis 02/11/2018   Metatarsalgia of both feet 03/11/2017   Migraine 09/13/2012   Overview:  IMPRESSION: possible abd migraine   TOBACCO ABUSE 09/21/2009   Qualifier: Diagnosis of  By: Delrae Alfred MD, Beverely Low of this note might be different from the original. 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  Tobacco Use   Smoking status: Former    Current packs/day: 0.00    Types: Cigarettes    Quit date: 1990    Years since quitting: 34.7   Smokeless tobacco: Never  Vaping Use   Vaping status: Never Used  Substance and Sexual Activity   Alcohol use: Yes    Comment: occasionally   Drug use: No   Sexual activity: Yes    Partners: Male    Birth control/protection: Surgical    Comment: hysterectomy  Other Topics Concern   Not on file  Social History Narrative   Not on file   Social Determinants of Health   Financial Resource Strain: Not on file  Food Insecurity: Food Insecurity Present (08/02/2022)   Hunger Vital Sign    Worried About Running Out of Food in the Last Year: Sometimes true    Ran Out of Food in the Last Year: Sometimes true  Transportation Needs: No Transportation Needs (08/02/2022)   PRAPARE - Administrator, Civil Service (Medical): No    Lack of Transportation (Non-Medical): No  Physical Activity: Not on file  Stress: Not on file  Social Connections: Unknown (06/23/2021)   [facility]   Social Network    Social Network: Not on file  Intimate Partner Violence: Not At Risk (08/02/2022)   Humiliation, Afraid, Rape, and Kick questionnaire    Fear of Current or Ex-Partner: No    Emotionally Abused: No    Physically Abused: No    Sexually Abused: No    Past Surgical History:  Procedure Laterality Date   ABDOMINAL HYSTERECTOMY     radical hysterectomy   AMPUTATION Left 01/06/2022   Procedure: AMPUTATION 2nd TOE;  Surgeon: Nadara Mustard, MD;  Location: [facility] OR;  Service:  Orthopedics;  Laterality: Left;   BIOPSY  11/06/2022   Procedure: BIOPSY;  Surgeon: Imogene Burn, MD;  Location: [facility] ENDOSCOPY;  Service: Gastroenterology;;   CHOLECYSTECTOMY     ESOPHAGOGASTRODUODENOSCOPY (EGD) WITH PROPOFOL N/A 11/06/2022   Procedure: ESOPHAGOGASTRODUODENOSCOPY (EGD) WITH PROPOFOL;  Surgeon: Imogene Burn, MD;  Location: [facility] ENDOSCOPY;  Service: Gastroenterology;  Laterality: N/A;   I & D EXTREMITY Left 04/24/2021   Procedure: LEFT LEG DEBRIDEMENT;  Surgeon: Nadara Mustard, MD;  Location: [facility] OR;  Service: Orthopedics;  Laterality: Left;   I & D EXTREMITY Left 05/01/2021   Procedure: LEFT KNEE DEBRIDEMENT;  Surgeon: Nadara Mustard, MD;  Location: [facility] OR;  Service: Orthopedics;  Laterality: Left;  Family History  Problem Relation Age of Onset   Breast cancer Mother    Stroke Mother    Hypertension Mother    Diabetes Mother    Hypertension Father    Diabetes Father    Heart attack Father    Hypertension Sister    Diabetes Sister    Hypertension Brother    Diabetes Brother    Hypertension Brother    Diabetes Brother    Stroke Maternal Grandmother     Allergies  Allergen Reactions   Lisinopril Swelling    Angioedema   Oxycodone-Acetaminophen Nausea Only   Latex Itching, Swelling and Rash   Morphine Itching and Rash    Current Outpatient Medications on File Prior to Visit  Medication Sig Dispense Refill   albuterol (VENTOLIN HFA) 108 (90 Base) MCG/ACT inhaler Inhale 2 puffs into the lungs every 6 (six) hours as needed for wheezing or shortness of breath. (Patient taking differently: Inhale 2 puffs into the lungs 3 (three) times daily as needed for wheezing or shortness of breath.) 18 g 2   amLODipine (NORVASC) 5 MG tablet Take 1 tablet (5 mg total) by mouth daily. 90 tablet 3   aspirin 81 MG tablet Take 81 mg by mouth daily.     atorvastatin (LIPITOR) 80 MG tablet Take 1 tablet (80 mg total) by mouth daily. 90 tablet 3   baclofen (LIORESAL) 10 MG tablet Take  1 tablet (10 mg total) by mouth 3 (three) times daily. 30 each 0   busPIRone (BUSPAR) 15 MG tablet Take 1 tablet (15 mg total) by mouth 2 (two) times daily. (Patient taking differently: Take 15 mg by mouth daily as needed (anxiety).) 60 tablet 4   carvedilol (COREG) 25 MG tablet Take 1 tablet (25 mg total) by mouth 2 (two) times daily with a meal. 180 tablet 0   Continuous Glucose Sensor (FREESTYLE LIBRE 3 SENSOR) MISC USE AS DIRECTED TO MONITOR BLOOD SUGAR. CHANGE EVERY 14 DAYS     fluticasone (FLONASE) 50 MCG/ACT nasal spray Place 2 sprays into both nostrils daily. 16 g 1   fluticasone furoate-vilanterol (BREO ELLIPTA) 100-25 MCG/INH AEPB Inhale 1 puff into the lungs daily. 100 each 2   gabapentin (NEURONTIN) 300 MG capsule TAKE 2 CAPSULES BY MOUTH THREE TIMES DAILY 270 capsule 0   Insulin Glargine (BASAGLAR KWIKPEN) 100 UNIT/ML INJECT 40 UNITS SUBCUTANEOUSLY AT BEDTIME 12 mL 0   Insulin Pen Needle (PEN NEEDLES 31GX5/16") 31G X 8 MM MISC Use daily as directed 100 each 0   lidocaine (LIDODERM) 5 % Place 1 patch onto the skin daily. Remove & Discard patch within 12 hours or as directed by MD 30 patch 0   metFORMIN (GLUCOPHAGE-XR) 500 MG 24 hr tablet Take 1 tablet (500 mg total) by mouth daily with breakfast. 60 tablet 2   metoCLOPramide (REGLAN) 5 MG tablet Take 1 tablet (5 mg total) by mouth 3 (three) times daily before meals. 90 tablet 0   naproxen (NAPROSYN) 250 MG tablet Take 1 tablet (250 mg total) by mouth 2 (two) times daily with a meal for 5 days. 10 tablet 0   nitroGLYCERIN (NITROSTAT) 0.4 MG SL tablet Place 1 tablet (0.4 mg total) under the tongue every 5 (five) minutes as needed for chest pain. 30 tablet 3   ondansetron (ZOFRAN) 4 MG tablet Take 4 mg by mouth every 8 (eight) hours as needed for nausea or vomiting.     Oxycodone HCl 10 MG TABS Take 1 tablet (10 mg total)  by mouth every 6 (six) hours as needed. 1 tab po bid prn severe pain. 10 tablet 0   OZEMPIC, 0.25 OR 0.5 MG/DOSE, 2  MG/3ML SOPN Inject 0.5 mg into the skin once a week. mondays     pantoprazole (PROTONIX) 40 MG tablet Take 1 tablet (40 mg total) by mouth 2 (two) times daily. 60 tablet 0   sucralfate (CARAFATE) 1 g tablet Take 1 tablet (1 g total) by mouth 4 (four) times daily. 120 tablet 11   topiramate (TOPAMAX) 50 MG tablet Take 1 tablet (50 mg total) by mouth 2 (two) times daily. (Patient taking differently: Take 50 mg by mouth 2 (two) times daily as needed (headache).) 60 tablet 12   VITAMIN D PO Take 5,000 Units by mouth daily.     No current facility-administered medications on file prior to visit.    BP 117/76 (BP Location: Right Arm, Patient Position: Sitting, Cuff Size: Normal)   Pulse 95   Temp 98 F (36.7 C) (Oral)   Resp 16   Ht 6\' 1"  (1.854 m)   Wt 174 lb (78.9 kg)   SpO2 97%   BMI 22.96 kg/m        Objective:   Physical Exam   General Mental Status- Alert. General Appearance- Not in acute distress.   Skin General: Color- Normal Color. Moisture- Normal Moisture.  Neck Carotid Arteries- Normal color. Moisture- Normal Moisture. No carotid bruits. No JVD.  Chest and Lung Exam Auscultation: Breath Sounds:-Normal.  Cardiovascular Auscultation:Rythm- Regular. Murmurs & Other Heart Sounds:Auscultation of the heart reveals- No Murmurs.  Abdomen Inspection:-Inspeection Normal. Palpation/Percussion:Note: epigastric and luq mild tenderNo mass. Palpation and Percussion of the abdomen reveal- Non Tender, Non Distended + BS, no rebound or guarding.  Anterior thorax- left lower rib are moderate pain on palpaiton.  Neurologic Cranial Nerve exam:- CN III-XII intact(No nystagmus), symmetric smile. Drift Test:- No drift. Romberg Exam:- Negative.  Heal to Toe Gait exam:-Normal. Finger to Nose:- Normal/Intact Strength:- 5/5 equal and symmetric strength both upper and lower extremities.      Assessment & Plan:   Patient Instructions  1. Epigastric pain Continue Protonix,  sucraflate and Reglan. - Comp Met (CMET) - Lipase - CBC w/Diff - Ambulatory referral to Gastroenterology  2. Nausea and vomiting, unspecified vomiting type -Same treatment with above medications.  Except do want you to contact your endocrinologist and point out that your lipase has been elevated in the past and double check whether or not you should be on Ozempic.  Ozempic is thought to possibly cause gastroparesis and not typically recommended with history of pancreatitis.  Advised not resuming Ozempic continue get clarification from your endocrinologist. - Lipase - Ambulatory referral to Gastroenterology  3. Hypomagnesemia - Magnesium  4. Rib pain on left side -Direct tenderness over ribs so we will get left rib series with chest x-ray. - DG Ribs Unilateral W/Chest Right; Future  5. Left lower quadrant abdominal pain - Ambulatory referral to Gastroenterology   Overall some improved since admission but still residual abdomen pain with nausea and vomiting though medications do help.  Is your signs and symptoms worsen or change as previously then recommend emergency department evaluation again.  Will await update on GI referral request.  Want you to follow-up in 3 to 4 weeks with me to review other chronical medical problems that could not be addressed on the hospital follow-up.   Esperanza Richters, PA-C   Time spent with patient today was 52  minutes which consisted of  chart revdiew, discussing diagnosis, work up treatment and documentation.

## 2022-11-12 NOTE — Patient Instructions (Addendum)
1. Epigastric pain Continue Protonix, sucraflate and Reglan. - Comp Met (CMET) - Lipase - CBC w/Diff - Ambulatory referral to Gastroenterology  2. Nausea and vomiting, unspecified vomiting type -Same treatment with above medications.  Except do want you to contact your endocrinologist and point out that your lipase has been elevated in the past and double check whether or not you should be on Ozempic.  Ozempic is thought to possibly cause gastroparesis and not typically recommended with history of pancreatitis.  Advised not resuming Ozempic continue get clarification from your endocrinologist. - Lipase - Ambulatory referral to Gastroenterology  3. Hypomagnesemia - Magnesium  4. Rib pain on left side -Direct tenderness over ribs so we will get left rib series with chest x-ray. - DG Ribs Unilateral W/Chest Right; Future  5. Left lower quadrant abdominal pain - Ambulatory referral to Gastroenterology   Overall some improved since admission but still residual abdomen pain with nausea and vomiting though medications do help.  Is your signs and symptoms worsen or change as previously then recommend emergency department evaluation again.  Will await update on GI referral request.  End noted need refill of chronic back pain. Last rx   Want you to follow-up in 3 to 4 weeks with me to review other chronical medical problems that could not be addressed on the hospital follow-up.

## 2022-11-14 LAB — DRUG MONITORING PANEL 376104, URINE
Amphetamines: NEGATIVE ng/mL (ref <500)
Barbiturates: NEGATIVE ng/mL (ref <300)
Benzodiazepines: NEGATIVE ng/mL (ref <100)
Cocaine Metabolite: NEGATIVE ng/mL (ref <150)
Codeine: NEGATIVE ng/mL (ref <50)
Desmethyltramadol: NEGATIVE ng/mL (ref <100)
Hydrocodone: NEGATIVE ng/mL (ref <50)
Hydromorphone: NEGATIVE ng/mL (ref <50)
Morphine: NEGATIVE ng/mL (ref <50)
Norhydrocodone: NEGATIVE ng/mL (ref <50)
Noroxycodone: 5085 ng/mL — ABNORMAL HIGH (ref <50)
Opiates: NEGATIVE ng/mL (ref <100)
Oxycodone: 1325 ng/mL — ABNORMAL HIGH (ref <50)
Oxycodone: POSITIVE ng/mL — AB (ref <100)
Oxymorphone: 2649 ng/mL — ABNORMAL HIGH (ref <50)
Tramadol: NEGATIVE ng/mL (ref <100)

## 2022-11-14 LAB — DM TEMPLATE

## 2022-11-17 ENCOUNTER — Telehealth: Payer: Self-pay | Admitting: Medical

## 2022-11-17 MED ORDER — AMLODIPINE BESYLATE 5 MG PO TABS: 5.0000 mg | ORAL_TABLET | Freq: Every day | ORAL | 3 refills | Status: DC

## 2022-11-17 MED ORDER — METFORMIN HCL ER 500 MG PO TB24: 500.0000 mg | ORAL_TABLET | Freq: Every day | ORAL | 2 refills | Status: DC

## 2022-11-17 MED ORDER — BACLOFEN 10 MG PO TABS: 10.0000 mg | ORAL_TABLET | Freq: Three times a day (TID) | ORAL | 0 refills | Status: DC

## 2022-11-17 MED ORDER — CARVEDILOL 25 MG PO TABS: 25.0000 mg | ORAL_TABLET | Freq: Two times a day (BID) | ORAL | 0 refills | Status: DC

## 2022-11-17 NOTE — Telephone Encounter (Signed)
Rx sent 

## 2022-11-17 NOTE — Addendum Note (Signed)
Addended by: Maximino Sarin on: 11/17/2022 01:24 PM   Modules accepted: Orders

## 2022-11-17 NOTE — Telephone Encounter (Signed)
Prescription Request  11/17/2022  Is this a "Controlled Substance" medicine? No  LOV: 11/12/2022  What is the name of the medication or equipment?  baclofen (LIORESAL) 10 MG tablet  amLODipine (NORVASC) 5 MG tablet  metFORMIN (GLUCOPHAGE-XR) 500 MG 24 hr tablet  carvedilol (COREG) 25 MG tablet [  Have you contacted your pharmacy to request a refill? No   Which pharmacy would you like this sent to?  [facility] [address] ([address]), [place] - [address] [place]) [place] [address] Phone: [phone] Fax: [phone]    Patient notified that their request is being sent to the clinical staff for review and that they should receive a response within 2 business days.   Please advise at [facility] [phone]

## 2022-11-26 ENCOUNTER — Other Ambulatory Visit (HOSPITAL_COMMUNITY): Payer: Self-pay

## 2022-11-26 ENCOUNTER — Telehealth: Payer: Self-pay

## 2022-11-26 NOTE — Telephone Encounter (Signed)
Pharmacy Patient Advocate Encounter  Received notification from [employer] that Prior Authorization for Oxycodone HCL 10MG  has been APPROVED from 11/26/2022 to 11/26/2023. Ran test claim, Copay is $0.00. This test claim was processed through [facility]- copay amounts may vary at other pharmacies due to pharmacy/plan contracts, or as the patient moves through the different stages of their insurance plan.   PA #/Case ID/Reference #: [id number] KEY: [id number]

## 2022-11-27 ENCOUNTER — Other Ambulatory Visit (HOSPITAL_COMMUNITY): Payer: Self-pay

## 2022-12-08 ENCOUNTER — Other Ambulatory Visit (HOSPITAL_COMMUNITY): Payer: Self-pay

## 2022-12-08 ENCOUNTER — Telehealth: Payer: Self-pay | Admitting: Medical

## 2022-12-08 ENCOUNTER — Encounter: Payer: Self-pay | Admitting: Medical

## 2022-12-08 NOTE — Telephone Encounter (Signed)
Spoke with pharmacy and they stated they needed a new PA done with todays date

## 2022-12-08 NOTE — Telephone Encounter (Signed)
Spoke with pharmacy again, letting them know we had a PA approval on the 16th per chart.... They stated that they needed a new one with todays date... I have sent the previous message over to the PA team

## 2022-12-08 NOTE — Telephone Encounter (Signed)
error 

## 2022-12-08 NOTE — Telephone Encounter (Signed)
Pharmacy is waiting on the PA to go thru for medication

## 2022-12-08 NOTE — Telephone Encounter (Signed)
Patient calls and states need someone to call [facility] [address] regarding her  Oxycodone HCl 10 MG TABS   She states she has picked up the rx but was a different quantity which was 10 pills and  that was prescribed while in hospital the 10 was prescribed as a bridge until next visit.  She is needing the quantity of 60 pills (full prescription).

## 2022-12-10 ENCOUNTER — Other Ambulatory Visit (HOSPITAL_COMMUNITY): Payer: Self-pay

## 2022-12-10 NOTE — Telephone Encounter (Signed)
Spoke with pharmacy, they did not have her [place] information. Gave them [employer] and they received paid claim. Pharmacy working on prescription.

## 2022-12-25 ENCOUNTER — Ambulatory Visit (AMBULATORY_SURGERY_CENTER): Payer: Managed Care, Other (non HMO) | Admitting: *Deleted

## 2022-12-25 VITALS — Ht 72.0 in | Wt 180.0 lb

## 2022-12-25 DIAGNOSIS — Z1211 Encounter for screening for malignant neoplasm of colon: Secondary | ICD-10-CM

## 2022-12-25 MED ORDER — PEG 3350-KCL-NA BICARB-NACL 420 G PO SOLR
4000.0000 mL | Freq: Once | ORAL | 0 refills | Status: AC
Start: 2022-12-25 — End: 2022-12-25

## 2022-12-25 NOTE — Progress Notes (Addendum)
Pt's name and DOB verified at the beginning of the pre-visit wit 2 identifiers  Pt denies any difficulty with ambulating,sitting, laying down or rolling side to side  Pt has issues with ambulation   Pt has no issues moving head neck or swallowing  No egg or soy allergy known to patient   No issues known to pt with past sedation with any surgeries or procedures  Pt denies having issues being intubated  No FH of Malignant Hyperthermia  Pt is on diet pills Pt is on diet shots  Pt is not on home 02   Pt is not on blood thinners   Pt denies issues with constipation   Pt is not on dialysis  Pt denise any abnormal heart rhythms   Pt denies any upcoming cardiac testing  Pt encouraged to use to use Singlecare or Goodrx to reduce cost   Patient's chart reviewed by Cathlyn Parsons CNRA prior to pre-visit and patient appropriate for the [facility].  Pre-visit completed and red dot placed by patient's name on their procedure day (on provider's schedule).  .  Visit by phone   Pt states weight is 180lb  Instructed pt why it is important to and  to call if they have any changes in health or new medications. Directed them to the # given and on instructions.     Instructions reviewed. Pt given both [facility] main # and MD on call # prior to instructions.  Pt states understanding. Instructed to review again prior to procedure. Pt states they will.   Instructions sent by mail and by My Chart

## 2022-12-31 ENCOUNTER — Ambulatory Visit (INDEPENDENT_AMBULATORY_CARE_PROVIDER_SITE_OTHER): Payer: Managed Care, Other (non HMO) | Admitting: Medical

## 2022-12-31 VITALS — BP 119/88 | HR 95 | Temp 98.4°F | Resp 18 | Ht 72.0 in | Wt 174.8 lb

## 2022-12-31 DIAGNOSIS — Z794 Long term (current) use of insulin: Secondary | ICD-10-CM | POA: Diagnosis not present

## 2022-12-31 DIAGNOSIS — M25512 Pain in left shoulder: Secondary | ICD-10-CM

## 2022-12-31 DIAGNOSIS — E1165 Type 2 diabetes mellitus with hyperglycemia: Secondary | ICD-10-CM

## 2022-12-31 MED ORDER — KETOROLAC TROMETHAMINE 30 MG/ML IJ SOLN
30.0000 mg | Freq: Once | INTRAMUSCULAR | Status: AC
Start: 2022-12-31 — End: 2022-12-31
  Administered 2022-12-31: 30 mg via INTRAMUSCULAR

## 2022-12-31 NOTE — Progress Notes (Signed)
Subjective:    Patient ID: [name], female    DOB: Jun 26, 1965, 57 y.o.   MRN: [id number]  HPI  Discussed the use of AI scribe software for clinical note transcription with the patient, who gave verbal consent to proceed.  History of Present Illness   The patient presents with a two-week history of left shoulder pain, which has been progressively worsening. The pain is severe, rated at 6-7 out of 10, and is localized to the front and back of the shoulder. The pain is exacerbated by lifting the arm above the head, rotating the arm, and lying on the left side. The patient reports that the shoulder pain is not relieved by her regular pain medication, oxycodone, which she takes for chronic low back pain due to disc disease.  The patient also reports that the left arm occasionally goes limp, particularly when attempting to pick up objects. This has resulted in a significant functional impairment, as the patient is unable to perform some tasks on the left side. The patient has tried hot showers for pain relief, which provides temporary relief only during the shower.  The patient has a history of diabetes and decreased kidney function, which limits the use of non-steroidal anti-inflammatory drugs (NSAIDs) and steroids for pain management. The patient occasionally uses ibuprofen but primarily relies on prescribed medications for pain management.       Past Medical History:  Diagnosis Date   Abnormal Pap smear of cervix    Anxiety 01/22/2016   Asthma    Cardiac murmur 02/01/2019   Cardiomyopathy, secondary (HCC) 09/06/2009   Qualifier: Diagnosis of  By: Jolene Provost   Formatting of this note might be different from the original. Overview:  Qualifier: Diagnosis of  By: Jolene Provost   Cataract    Cervical cancer [facility]) 2001   Radical hysterectomy in [facility]   Chronic kidney disease, stage III (moderate) (HCC) 05/23/2014   Chronic pain syndrome  01/23/2016   Coronary artery disease    30% lesions noted   Depression    Diabetes type 2, uncontrolled 05/23/2014   Diabetic gastroparesis associated with type 1 diabetes mellitus (HCC) 11/27/2016   DIABETIC PERIPHERAL NEUROPATHY 09/21/2009   Qualifier: Diagnosis of  By: Delrae Alfred MD, [name]     Essential hypertension 08/30/2009   Qualifier: Diagnosis of  By: Cristela Felt, CNA, Christy     Gastroesophageal reflux disease 07/30/2009   Formatting of this note might be different from the original. Overview:  Overview:  Qualifier: Diagnosis of  By: Delrae Alfred MD, Alfonso Patten: Diagnosis of  By: Wilmon Pali NP, Consuela Mimes of this note might be different from the original. Overview:  Qualifier: Diagnosis of  By: Delrae Alfred MD, Alfonso Patten: Diagnosis of  By: Wilmon Pali NP, Gunnar Fusi   GERD (gastroesophageal reflux disease)    History of cervical cancer 08/17/2015   Status post partial hysterectomy  Formatting of this note might be different from the original. Status post partial hysterectomy   Hyperlipidemia    INSOMNIA 09/21/2009   Qualifier: Diagnosis of  By: Delrae Alfred MD, [name]     Lumbar facet arthropathy 05/14/2016   Lumbar spinal stenosis 02/11/2018   Metatarsalgia of both feet 03/11/2017   Migraine 09/13/2012   Overview:  IMPRESSION: possible abd migraine   TOBACCO ABUSE 09/21/2009   Qualifier: Diagnosis of  By: Delrae Alfred MD, Beverely Low of this note might be different from the original. 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  Tobacco Use   Smoking status: Former    Current packs/day: 0.00    Types: Cigarettes    Quit date: 1990    Years since quitting: 34.9   Smokeless tobacco: Never  Vaping Use   Vaping status: Never Used  Substance and Sexual Activity   Alcohol use: Yes    Comment: occasionally   Drug use: No   Sexual activity: Yes    Partners: Male    Birth control/protection: Surgical    Comment: hysterectomy  Other Topics Concern   Not on file  Social History Narrative   Not on file   Social Determinants of Health   Financial Resource Strain: Not on file  Food Insecurity: No Food Insecurity (11/12/2022)   Hunger Vital Sign    Worried About Running Out of Food in the Last Year: Never true    Ran Out of Food in the Last Year: Never true  Transportation Needs: No Transportation Needs (11/12/2022)   PRAPARE - Administrator, Civil Service (Medical): No    Lack of Transportation (Non-Medical): No  Physical Activity: Not on file  Stress: Not on file  Social Connections: Unknown (06/23/2021)   Received from [facility], [facility]   Social Network    Social Network: Not on file  Intimate Partner Violence: Not At Risk (08/02/2022)   Humiliation, Afraid, Rape, and Kick questionnaire    Fear of Current or Ex-Partner: No    Emotionally Abused: No    Physically Abused: No    Sexually Abused: No    Past Surgical History:  Procedure Laterality Date   ABDOMINAL HYSTERECTOMY     radical hysterectomy   AMPUTATION Left 01/06/2022   Procedure: AMPUTATION 2nd TOE;  Surgeon: Nadara Mustard, MD;  Location: [facility] OR;  Service: Orthopedics;  Laterality: Left;   BIOPSY  11/06/2022   Procedure: BIOPSY;  Surgeon: Imogene Burn, MD;  Location: [facility] ENDOSCOPY;  Service: Gastroenterology;;   CHOLECYSTECTOMY     ESOPHAGOGASTRODUODENOSCOPY (EGD) WITH PROPOFOL N/A 11/06/2022   Procedure: ESOPHAGOGASTRODUODENOSCOPY (EGD)  WITH PROPOFOL;  Surgeon: Imogene Burn, MD;  Location: [facility] ENDOSCOPY;  Service: Gastroenterology;  Laterality: N/A;   I & D EXTREMITY Left 04/24/2021   Procedure: LEFT LEG DEBRIDEMENT;  Surgeon: Nadara Mustard, MD;  Location: [facility] OR;  Service: Orthopedics;  Laterality: Left;   I & D EXTREMITY Left 05/01/2021   Procedure: LEFT KNEE DEBRIDEMENT;  Surgeon: Nadara Mustard, MD;  Location: [facility] OR;  Service: Orthopedics;  Laterality: Left;    Family History  Problem Relation Age of Onset   Breast cancer Mother    Stroke Mother    Hypertension Mother    Diabetes Mother    Hypertension Father    Diabetes Father    Heart attack Father    Hypertension Sister    Diabetes Sister    Hypertension Brother  Diabetes Brother    Hypertension Brother    Diabetes Brother    Stroke Maternal Grandmother    Colon cancer Neg Hx    Colon polyps Neg Hx    Esophageal cancer Neg Hx    Stomach cancer Neg Hx    Rectal cancer Neg Hx     Allergies  Allergen Reactions   Lisinopril Swelling    Angioedema   Oxycodone-Acetaminophen Nausea Only   Latex Itching, Swelling and Rash   Morphine Itching and Rash    Current Outpatient Medications on File Prior to Visit  Medication Sig Dispense Refill   albuterol (VENTOLIN HFA) 108 (90 Base) MCG/ACT inhaler Inhale 2 puffs into the lungs every 6 (six) hours as needed for wheezing or shortness of breath. (Patient taking differently: Inhale 2 puffs into the lungs 3 (three) times daily as needed for wheezing or shortness of breath.) 18 g 2   amLODipine (NORVASC) 5 MG tablet Take 1 tablet (5 mg total) by mouth daily. 90 tablet 3   aspirin 81 MG tablet Take 81 mg by mouth daily.     atorvastatin (LIPITOR) 80 MG tablet Take 1 tablet (80 mg total) by mouth daily. 90 tablet 3   baclofen (LIORESAL) 10 MG tablet Take 1 tablet (10 mg total) by mouth 3 (three) times daily. 30 each 0   busPIRone (BUSPAR) 15 MG tablet Take 1 tablet (15 mg total) by mouth 2 (two) times daily.  (Patient taking differently: Take 15 mg by mouth daily as needed (anxiety).) 60 tablet 4   carvedilol (COREG) 25 MG tablet Take 1 tablet (25 mg total) by mouth 2 (two) times daily with a meal. 180 tablet 0   Continuous Glucose Sensor (FREESTYLE LIBRE 3 SENSOR) MISC USE AS DIRECTED TO MONITOR BLOOD SUGAR. CHANGE EVERY 14 DAYS     fluticasone (FLONASE) 50 MCG/ACT nasal spray Place 2 sprays into both nostrils daily. 16 g 1   fluticasone furoate-vilanterol (BREO ELLIPTA) 100-25 MCG/INH AEPB Inhale 1 puff into the lungs daily. 100 each 2   gabapentin (NEURONTIN) 300 MG capsule TAKE 2 CAPSULES BY MOUTH THREE TIMES DAILY 270 capsule 0   Insulin Glargine (BASAGLAR KWIKPEN) 100 UNIT/ML INJECT 40 UNITS SUBCUTANEOUSLY AT BEDTIME 12 mL 0   Insulin Pen Needle (PEN NEEDLES 31GX5/16") 31G X 8 MM MISC Use daily as directed 100 each 0   lidocaine (LIDODERM) 5 % Place 1 patch onto the skin daily. Remove & Discard patch within 12 hours or as directed by MD 30 patch 0   metFORMIN (GLUCOPHAGE-XR) 500 MG 24 hr tablet Take 1 tablet (500 mg total) by mouth daily with breakfast. 60 tablet 2   nitroGLYCERIN (NITROSTAT) 0.4 MG SL tablet Place 1 tablet (0.4 mg total) under the tongue every 5 (five) minutes as needed for chest pain. 30 tablet 3   Oxycodone HCl 10 MG TABS 1 tab po bid prn severe pain. 60 tablet 0   OZEMPIC, 0.25 OR 0.5 MG/DOSE, 2 MG/3ML SOPN Inject 0.5 mg into the skin once a week. mondays     sucralfate (CARAFATE) 1 g tablet Take 1 tablet (1 g total) by mouth 4 (four) times daily. 120 tablet 11   topiramate (TOPAMAX) 50 MG tablet Take 1 tablet (50 mg total) by mouth 2 (two) times daily. (Patient taking differently: Take 50 mg by mouth 2 (two) times daily as needed (headache).) 60 tablet 12   VITAMIN D PO Take 5,000 Units by mouth daily.     metoCLOPramide (REGLAN) 5  MG tablet Take 1 tablet (5 mg total) by mouth 3 (three) times daily before meals. 90 tablet 0   ondansetron (ZOFRAN) 4 MG tablet Take 4 mg by mouth  every 8 (eight) hours as needed for nausea or vomiting. (Patient not taking: Reported on 12/31/2022)     pantoprazole (PROTONIX) 40 MG tablet Take 1 tablet (40 mg total) by mouth 2 (two) times daily. 60 tablet 0   No current facility-administered medications on file prior to visit.    BP 119/88 (BP Location: Right Arm, Patient Position: Sitting, Cuff Size: Normal)   Pulse 95   Temp 98.4 F (36.9 C) (Oral)   Resp 18   Ht 6' (1.829 m)   Wt 174 lb 12.8 oz (79.3 kg)   SpO2 100%   BMI 23.71 kg/m       Review of Systems  Constitutional:  Negative for chills, fatigue and fever.  Respiratory:  Negative for cough, chest tightness, shortness of breath and wheezing.   Cardiovascular:  Negative for chest pain and palpitations.  Gastrointestinal:  Negative for abdominal pain.  Musculoskeletal:  Negative for back pain.       Shoulder pain.  Neurological:  Negative for dizziness, light-headedness and headaches.  Hematological:  Negative for adenopathy. Does not bruise/bleed easily.  Psychiatric/Behavioral:  Negative for behavioral problems and confusion.     Past Medical History:  Diagnosis Date   Abnormal Pap smear of cervix    Anxiety 01/22/2016   Asthma    Cardiac murmur 02/01/2019   Cardiomyopathy, secondary (HCC) 09/06/2009   Qualifier: Diagnosis of  By: Jolene Provost   Formatting of this note might be different from the original. Overview:  Qualifier: Diagnosis of  By: Jolene Provost   Cataract    Cervical cancer [facility]) 2001   Radical hysterectomy in [facility]   Chronic kidney disease, stage III (moderate) (HCC) 05/23/2014   Chronic pain syndrome 01/23/2016   Coronary artery disease    30% lesions noted   Depression    Diabetes type 2, uncontrolled 05/23/2014   Diabetic gastroparesis associated with type 1 diabetes mellitus (HCC) 11/27/2016   DIABETIC PERIPHERAL NEUROPATHY 09/21/2009   Qualifier: Diagnosis of  By: Delrae Alfred MD, [name]      Essential hypertension 08/30/2009   Qualifier: Diagnosis of  By: Cristela Felt, CNA, Christy     Gastroesophageal reflux disease 07/30/2009   Formatting of this note might be different from the original. Overview:  Overview:  Qualifier: Diagnosis of  By: Delrae Alfred MD, Alfonso Patten: Diagnosis of  By: Wilmon Pali NP, Consuela Mimes of this note might be different from the original. Overview:  Qualifier: Diagnosis of  By: Delrae Alfred MD, Alfonso Patten: Diagnosis of  By: Wilmon Pali NP, Gunnar Fusi   GERD (gastroesophageal reflux disease)    History of cervical cancer 08/17/2015   Status post partial hysterectomy  Formatting of this note might be different from the original. Status post partial hysterectomy   Hyperlipidemia    INSOMNIA 09/21/2009   Qualifier: Diagnosis of  By: Delrae Alfred MD, [name]     Lumbar facet arthropathy 05/14/2016   Lumbar spinal stenosis 02/11/2018   Metatarsalgia of both feet 03/11/2017   Migraine 09/13/2012   Overview:  IMPRESSION: possible abd migraine   TOBACCO ABUSE 09/21/2009   Qualifier: Diagnosis of  By: Delrae Alfred MD, Beverely Low of this note might be different from the original. 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  Tobacco Use   Smoking status: Former    Current packs/day: 0.00    Types: Cigarettes    Quit date: 1990    Years since quitting: 34.9   Smokeless tobacco: Never  Vaping Use   Vaping status: Never Used  Substance and Sexual  Activity   Alcohol use: Yes    Comment: occasionally   Drug use: No   Sexual activity: Yes    Partners: Male    Birth control/protection: Surgical    Comment: hysterectomy  Other Topics Concern   Not on file  Social History Narrative   Not on file   Social Determinants of Health   Financial Resource Strain: Not on file  Food Insecurity: No Food Insecurity (11/12/2022)   Hunger Vital Sign    Worried About Running Out of Food in the Last Year: Never true    Ran Out of Food in the Last Year: Never true  Transportation Needs: No Transportation Needs (11/12/2022)   PRAPARE - Administrator, Civil Service (Medical): No    Lack of Transportation (Non-Medical): No  Physical Activity: Not on file  Stress: Not on file  Social Connections: Unknown (06/23/2021)   [facility]   Social Network    Social Network: Not on file  Intimate Partner Violence: Not At Risk (08/02/2022)   Humiliation, Afraid, Rape, and Kick questionnaire    Fear of Current or Ex-Partner: No    Emotionally Abused: No    Physically Abused: No    Sexually Abused: No    Past Surgical History:  Procedure Laterality Date   ABDOMINAL HYSTERECTOMY     radical hysterectomy   AMPUTATION Left 01/06/2022   Procedure: AMPUTATION 2nd TOE;  Surgeon: Nadara Mustard, MD;  Location: [facility] OR;  Service: Orthopedics;  Laterality: Left;   BIOPSY  11/06/2022   Procedure: BIOPSY;  Surgeon: Imogene Burn, MD;  Location: [facility] ENDOSCOPY;  Service: Gastroenterology;;   CHOLECYSTECTOMY     ESOPHAGOGASTRODUODENOSCOPY (EGD) WITH PROPOFOL N/A 11/06/2022   Procedure: ESOPHAGOGASTRODUODENOSCOPY (EGD) WITH PROPOFOL;  Surgeon: Imogene Burn, MD;  Location: [facility] ENDOSCOPY;  Service: Gastroenterology;  Laterality: N/A;   I & D EXTREMITY Left 04/24/2021   Procedure: LEFT LEG DEBRIDEMENT;  Surgeon: Nadara Mustard, MD;  Location: [facility] OR;  Service: Orthopedics;  Laterality: Left;   I & D EXTREMITY Left 05/01/2021    Procedure: LEFT KNEE DEBRIDEMENT;  Surgeon: Nadara Mustard, MD;  Location: [facility] OR;  Service: Orthopedics;  Laterality: Left;    Family History  Problem Relation Age of Onset   Breast cancer Mother    Stroke Mother    Hypertension Mother    Diabetes Mother    Hypertension Father    Diabetes Father    Heart attack Father    Hypertension Sister    Diabetes Sister    Hypertension Brother    Diabetes Brother  Hypertension Brother    Diabetes Brother    Stroke Maternal Grandmother    Colon cancer Neg Hx    Colon polyps Neg Hx    Esophageal cancer Neg Hx    Stomach cancer Neg Hx    Rectal cancer Neg Hx     Allergies  Allergen Reactions   Lisinopril Swelling    Angioedema   Oxycodone-Acetaminophen Nausea Only   Latex Itching, Swelling and Rash   Morphine Itching and Rash    Current Outpatient Medications on File Prior to Visit  Medication Sig Dispense Refill   albuterol (VENTOLIN HFA) 108 (90 Base) MCG/ACT inhaler Inhale 2 puffs into the lungs every 6 (six) hours as needed for wheezing or shortness of breath. (Patient taking differently: Inhale 2 puffs into the lungs 3 (three) times daily as needed for wheezing or shortness of breath.) 18 g 2   amLODipine (NORVASC) 5 MG tablet Take 1 tablet (5 mg total) by mouth daily. 90 tablet 3   aspirin 81 MG tablet Take 81 mg by mouth daily.     atorvastatin (LIPITOR) 80 MG tablet Take 1 tablet (80 mg total) by mouth daily. 90 tablet 3   baclofen (LIORESAL) 10 MG tablet Take 1 tablet (10 mg total) by mouth 3 (three) times daily. 30 each 0   busPIRone (BUSPAR) 15 MG tablet Take 1 tablet (15 mg total) by mouth 2 (two) times daily. (Patient taking differently: Take 15 mg by mouth daily as needed (anxiety).) 60 tablet 4   carvedilol (COREG) 25 MG tablet Take 1 tablet (25 mg total) by mouth 2 (two) times daily with a meal. 180 tablet 0   Continuous Glucose Sensor (FREESTYLE LIBRE 3 SENSOR) MISC USE AS DIRECTED TO MONITOR BLOOD SUGAR. CHANGE EVERY  14 DAYS     fluticasone (FLONASE) 50 MCG/ACT nasal spray Place 2 sprays into both nostrils daily. 16 g 1   fluticasone furoate-vilanterol (BREO ELLIPTA) 100-25 MCG/INH AEPB Inhale 1 puff into the lungs daily. 100 each 2   gabapentin (NEURONTIN) 300 MG capsule TAKE 2 CAPSULES BY MOUTH THREE TIMES DAILY 270 capsule 0   Insulin Glargine (BASAGLAR KWIKPEN) 100 UNIT/ML INJECT 40 UNITS SUBCUTANEOUSLY AT BEDTIME 12 mL 0   Insulin Pen Needle (PEN NEEDLES 31GX5/16") 31G X 8 MM MISC Use daily as directed 100 each 0   lidocaine (LIDODERM) 5 % Place 1 patch onto the skin daily. Remove & Discard patch within 12 hours or as directed by MD 30 patch 0   metFORMIN (GLUCOPHAGE-XR) 500 MG 24 hr tablet Take 1 tablet (500 mg total) by mouth daily with breakfast. 60 tablet 2   nitroGLYCERIN (NITROSTAT) 0.4 MG SL tablet Place 1 tablet (0.4 mg total) under the tongue every 5 (five) minutes as needed for chest pain. 30 tablet 3   Oxycodone HCl 10 MG TABS 1 tab po bid prn severe pain. 60 tablet 0   OZEMPIC, 0.25 OR 0.5 MG/DOSE, 2 MG/3ML SOPN Inject 0.5 mg into the skin once a week. mondays     sucralfate (CARAFATE) 1 g tablet Take 1 tablet (1 g total) by mouth 4 (four) times daily. 120 tablet 11   topiramate (TOPAMAX) 50 MG tablet Take 1 tablet (50 mg total) by mouth 2 (two) times daily. (Patient taking differently: Take 50 mg by mouth 2 (two) times daily as needed (headache).) 60 tablet 12   VITAMIN D PO Take 5,000 Units by mouth daily.     metoCLOPramide (REGLAN) 5 MG tablet Take 1 tablet (  5 mg total) by mouth 3 (three) times daily before meals. 90 tablet 0   ondansetron (ZOFRAN) 4 MG tablet Take 4 mg by mouth every 8 (eight) hours as needed for nausea or vomiting. (Patient not taking: Reported on 12/31/2022)     pantoprazole (PROTONIX) 40 MG tablet Take 1 tablet (40 mg total) by mouth 2 (two) times daily. 60 tablet 0   No current facility-administered medications on file prior to visit.    BP 119/88 (BP Location:  Right Arm, Patient Position: Sitting, Cuff Size: Normal)   Pulse 95   Temp 98.4 F (36.9 C) (Oral)   Resp 18   Ht 6' (1.829 m)   Wt 174 lb 12.8 oz (79.3 kg)   SpO2 100%   BMI 23.71 kg/m        Objective:   Physical Exam  General- No acute distress. Pleasant patient. Neck- Full range of motion, no jvd Lungs- Clear, even and unlabored. Heart- regular rate and rhythm. Neurologic- CNII- XII grossly intact.  Left shoulder- anterior aspect pain on palpation. Moderate to severe pain lifting arm above shoulder or rotating upper ext.     Assessment & Plan:   Assessment and Plan    Left Shoulder Pain Moderate to severe pain for two weeks, exacerbated by lifting arm above head and rotating. Pain is not well controlled with current pain medication (Oxycodone). Possible musculoskeletal etiology. -Order shoulder X-ray with stat read. -Administer low dose Toradol 30mg  today, considering patient's kidney function. -Order labs to check kidney function and HbA1c.(decide on med options after review of labs) -Consider referral to sports medicine depending on X-ray results and lab findings.  Chronic Low Back Pain Managed with Oxycodone. No change in symptoms. -Continue current management.  Chronic Kidney Disease Elevated kidney function previously noted. Limits use of NSAIDs. -Check kidney function today.  Diabetes Mellitus Elevated HbA1c noted 4 months ago. -Check HbA1c today.   Follow up date to be determined after lab review.        Esperanza Richters, PA-C

## 2022-12-31 NOTE — Addendum Note (Signed)
Addended by: Marian Sorrow D on: 12/31/2022 03:19 PM   Modules accepted: Orders

## 2022-12-31 NOTE — Patient Instructions (Signed)
Left Shoulder Pain Moderate to severe pain for two weeks, exacerbated by lifting arm above head and rotating. Pain is not well controlled with current pain medication (Oxycodone). Possible musculoskeletal etiology. -Order shoulder X-ray with stat read. -Administer low dose Toradol 30mg  today, considering patient's kidney function. -Order labs to check kidney function and HbA1c.(decide on med options after review of labs) -Consider referral to sports medicine depending on X-ray results and lab findings.  Chronic Low Back Pain Managed with Oxycodone. No change in symptoms. -Continue current management.  Chronic Kidney Disease Elevated kidney function previously noted. Limits use of NSAIDs. -Check kidney function today.  Diabetes Mellitus Elevated HbA1c noted 4 months ago. -Check HbA1c today.   Follow up date to be determined after lab review.

## 2023-01-01 LAB — COMPREHENSIVE METABOLIC PANEL
ALT: 12 U/L (ref 0–35)
AST: 15 U/L (ref 0–37)
Albumin: 3.9 g/dL (ref 3.5–5.2)
Alkaline Phosphatase: 111 U/L (ref 39–117)
BUN: 21 mg/dL (ref 6–23)
CO2: 29 meq/L (ref 19–32)
Calcium: 9.4 mg/dL (ref 8.4–10.5)
Chloride: 102 meq/L (ref 96–112)
Creatinine, Ser: 1.27 mg/dL — ABNORMAL HIGH (ref 0.40–1.20)
GFR: 46.77 mL/min — ABNORMAL LOW (ref >60.00)
Glucose, Bld: 167 mg/dL — ABNORMAL HIGH (ref 70–99)
Potassium: 4.9 meq/L (ref 3.5–5.1)
Sodium: 138 meq/L (ref 135–145)
Total Bilirubin: 0.3 mg/dL (ref 0.2–1.2)
Total Protein: 7.1 g/dL (ref 6.0–8.3)

## 2023-01-01 LAB — HEMOGLOBIN A1C: Hgb A1c MFr Bld: 7.4 % — ABNORMAL HIGH (ref 4.6–6.5)

## 2023-01-02 ENCOUNTER — Encounter: Payer: Self-pay | Admitting: Certified Registered"

## 2023-01-03 ENCOUNTER — Other Ambulatory Visit: Payer: Self-pay | Admitting: Medical

## 2023-01-05 NOTE — Telephone Encounter (Signed)
Pt called to refill medication. Per chart review, medication was denied as "Refill not appropriate". Pt would like to know why. Please advise.

## 2023-01-07 NOTE — Telephone Encounter (Signed)
Spoke with pt she stated you normally prescribed it , looks like you have been prescribing it on and off since 2020 . Last start fill date 05/24 and last refill was 09/24 .

## 2023-01-08 MED ORDER — PROMETHAZINE HCL 12.5 MG PO TABS
12.5000 mg | ORAL_TABLET | Freq: Three times a day (TID) | ORAL | 0 refills | Status: DC | PRN
Start: 2023-01-08 — End: 2023-03-10

## 2023-01-08 NOTE — Telephone Encounter (Signed)
Sent phenergan rx to pharmacy over holiday weekend.

## 2023-01-08 NOTE — Addendum Note (Signed)
Addended by: Gwenevere Abbot on: 01/08/2023 02:52 PM   Modules accepted: Orders

## 2023-01-13 ENCOUNTER — Telehealth: Payer: Self-pay | Admitting: Internal Medicine

## 2023-01-13 NOTE — Telephone Encounter (Signed)
Pt called on-call MD tonight to cancel colonoscopy scheduled for tomorrow Stated she was feeling poorly without additional information Had not started her prep  Wishing to reschedule  I will forward to [facility] RNs and Dr. Durward Fortes

## 2023-01-14 ENCOUNTER — Encounter: Payer: Managed Care, Other (non HMO) | Admitting: Internal Medicine

## 2023-01-14 NOTE — Telephone Encounter (Signed)
Hi Dr Leonides Schanz,   Should we no show and charge this patient for her appointment today? Stated she was feeling poorly without additional information Had not started her prep. Please advise.   Thank you

## 2023-01-17 ENCOUNTER — Ambulatory Visit
Admission: EM | Admit: 2023-01-17 | Discharge: 2023-01-17 | Disposition: A | Discharge status: Home / Self Care | Payer: Commercial Managed Care - HMO | Attending: Physician Assistant | Admitting: Physician Assistant

## 2023-01-17 DIAGNOSIS — K047 Periapical abscess without sinus: Secondary | ICD-10-CM

## 2023-01-17 MED ORDER — AMOXICILLIN 500 MG PO CAPS: 500.0000 mg | ORAL_CAPSULE | Freq: Three times a day (TID) | ORAL | 0 refills | Status: DC

## 2023-01-17 NOTE — ED Provider Notes (Signed)
[facility]    CSN: [id number] Arrival date & time: 01/17/23  1215      History   Chief Complaint Chief Complaint  Patient presents with   Dental Pain    HPI [name] is a 57 y.o. female.   Patient here today for evaluation of tooth pain that started to the right upper side of the mouth a few days ago.  It has continued to worsen with time.  She reports some facial swelling on the right side as well.  She has not any fever.  The history is provided by the patient.  Dental Pain Associated symptoms: no fever     Past Medical History:  Diagnosis Date   Abnormal Pap smear of cervix    Anxiety 01/22/2016   Asthma    Cardiac murmur 02/01/2019   Cardiomyopathy, secondary (HCC) 09/06/2009   Qualifier: Diagnosis of  By: Jolene Provost   Formatting of this note might be different from the original. Overview:  Qualifier: Diagnosis of  By: Jolene Provost   Cataract    Cervical cancer [facility]) 2001   Radical hysterectomy in [facility]   Chronic kidney disease, stage III (moderate) (HCC) 05/23/2014   Chronic pain syndrome 01/23/2016   Coronary artery disease    30% lesions noted   Depression    Diabetes type 2, uncontrolled 05/23/2014   Diabetic gastroparesis associated with type 1 diabetes mellitus (HCC) 11/27/2016   DIABETIC PERIPHERAL NEUROPATHY 09/21/2009   Qualifier: Diagnosis of  By: Delrae Alfred MD, [name]     Essential hypertension 08/30/2009   Qualifier: Diagnosis of  By: Cristela Felt, CNA, Christy     Gastroesophageal reflux disease 07/30/2009   Formatting of this note might be different from the original. Overview:  Overview:  Qualifier: Diagnosis of  By: Delrae Alfred MD, Alfonso Patten: Diagnosis of  By: Wilmon Pali NP, Consuela Mimes of this note might be different from the original. Overview:  Qualifier: Diagnosis of  By: Delrae Alfred MD, Alfonso Patten: Diagnosis of  By: Wilmon Pali NP, Gunnar Fusi   GERD (gastroesophageal  reflux disease)    History of cervical cancer 08/17/2015   Status post partial hysterectomy  Formatting of this note might be different from the original. Status post partial hysterectomy   Hyperlipidemia    INSOMNIA 09/21/2009   Qualifier: Diagnosis of  By: Delrae Alfred MD, [name]     Lumbar facet arthropathy 05/14/2016   Lumbar spinal stenosis 02/11/2018   Metatarsalgia of both feet 03/11/2017   Migraine 09/13/2012   Overview:  IMPRESSION: possible abd migraine   TOBACCO ABUSE 09/21/2009   Qualifier: Diagnosis of  By: Delrae Alfred MD, Beverely Low of this note might be different from the original. Overview:  Qualifier: Diagnosis of  By: Delrae Alfred MD, [name]   Trigeminal neuralgia    ULCER-GASTRIC 09/28/2009   Qualifier: Diagnosis of  By: Wilmon Pali NP, Leeann Must, CANDIDAL 12/25/2009   Qualifier: Diagnosis of  By: Delrae Alfred MD, Hubbard Robinson D deficiency 04/25/2013    Patient Active Problem List   Diagnosis Date Noted   Coffee ground emesis 11/05/2022   Abnormal CT scan 11/05/2022   Acute renal failure superimposed on stage 3a chronic kidney disease (HCC) 11/04/2022   Intractable nausea and vomiting 08/03/2022   Dehydration 08/02/2022   High anion gap metabolic acidosis 08/02/2022   Acute pyelonephritis 08/02/2022   Osteomyelitis of second toe of left foot (HCC) 01/05/2022   Mood  disorder (HCC) 01/05/2022   SIRS (systemic inflammatory response syndrome) (HCC) 05/21/2021   Chronic diastolic HF (heart failure) (HCC) 05/21/2021   Abscess of left lower leg    Necrotizing fasciitis (HCC)    Cellulitis of left leg 04/22/2021   Sepsis due to cellulitis (HCC) 04/22/2021   Severe sepsis (HCC) 04/22/2021   Acute kidney injury superimposed on chronic kidney disease (HCC) 04/22/2021   Chest pain, unspecified 04/22/2021   Uncontrolled type 2 diabetes mellitus with hyperglycemia, without long-term current use of insulin (HCC) 04/22/2021   Pain of left calf  04/19/2021   Viral upper respiratory tract infection 11/30/2020   Trigeminal neuralgia    Cervical cancer (HCC)    Chronic back pain    Coronary artery disease    GERD (gastroesophageal reflux disease)    Hyperlipidemia    Hypertension    Dyslipidemia 02/01/2019   Cardiac murmur 02/01/2019   Lumbar spinal stenosis 02/11/2018   Neuropathy 07/20/2017   Abdominal pain with vomiting    Nausea & vomiting 07/07/2017   Acute cystitis 07/07/2017   CAD (coronary artery disease) 07/07/2017   Metatarsalgia of both feet 03/11/2017   Diabetic gastroparesis associated with type 1 diabetes mellitus (HCC) 11/27/2016   Pure hypercholesterolemia 11/27/2016   Lumbar facet arthropathy 05/14/2016   Chronic pain syndrome 01/23/2016   Diabetic peripheral neuropathy (HCC) 01/23/2016   Anxiety 01/22/2016   History of cervical cancer 08/17/2015   Depression 08/10/2015   Overweight (BMI 25.0-29.9) 07/21/2014   Chronic kidney disease, stage III (moderate) (HCC) 05/23/2014   Diabetes type 2, uncontrolled 05/23/2014   Current use of insulin (HCC) 05/23/2014   Vitamin D deficiency 04/25/2013   Cerebrovascular disease 09/13/2012   Migraine 09/13/2012   Nonspecific abnormal findings on radiological and examination of skull and head 09/13/2012   Retention of urine 06/25/2011   ANKLE PAIN, LEFT 12/25/2009   DYSURIA 12/25/2009   VAGINITIS, CANDIDAL 12/25/2009   PUD (peptic ulcer disease) 09/28/2009   DIABETIC PERIPHERAL NEUROPATHY 09/21/2009   TOBACCO ABUSE 09/21/2009   UTI (urinary tract infection) 09/21/2009   INSOMNIA 09/21/2009   TRANSAMINASES, SERUM, ELEVATED 09/21/2009   Elevation of level of transaminase or lactic acid dehydrogenase (LDH) 09/21/2009   Type 2 diabetes mellitus with stage 3 chronic kidney disease (HCC) 09/14/2009   Nausea with vomiting, unspecified 09/14/2009   ABDOMINAL PAIN-EPIGASTRIC 09/14/2009   Cardiomyopathy, secondary (HCC) 09/06/2009   Cardiomyopathy, unspecified (HCC)  09/06/2009   Essential hypertension 08/30/2009   DM2 (diabetes mellitus, type 2) (HCC) 07/30/2009   HLD (hyperlipidemia) 07/30/2009   GERD 07/30/2009   GASTROPARESIS 07/30/2009   CHEST PAIN, ATYPICAL 07/30/2009   POSITIVE PPD 07/30/2009   Type 2 diabetes mellitus with hyperglycemia (HCC) 07/30/2009   Gastric atony 07/30/2009   Gastroesophageal reflux disease 07/30/2009   Hyperlipidemia, unspecified 07/30/2009   Tuberculin test reaction 07/30/2009    Past Surgical History:  Procedure Laterality Date   ABDOMINAL HYSTERECTOMY     radical hysterectomy   AMPUTATION Left 01/06/2022   Procedure: AMPUTATION 2nd TOE;  Surgeon: Nadara Mustard, MD;  Location: [facility] OR;  Service: Orthopedics;  Laterality: Left;   BIOPSY  11/06/2022   Procedure: BIOPSY;  Surgeon: Imogene Burn, MD;  Location: [facility] ENDOSCOPY;  Service: Gastroenterology;;   CHOLECYSTECTOMY     ESOPHAGOGASTRODUODENOSCOPY (EGD) WITH PROPOFOL N/A 11/06/2022   Procedure: ESOPHAGOGASTRODUODENOSCOPY (EGD) WITH PROPOFOL;  Surgeon: Imogene Burn, MD;  Location: [facility] ENDOSCOPY;  Service: Gastroenterology;  Laterality: N/A;   I & D EXTREMITY Left 04/24/2021  Procedure: LEFT LEG DEBRIDEMENT;  Surgeon: Nadara Mustard, MD;  Location: [facility] OR;  Service: Orthopedics;  Laterality: Left;   I & D EXTREMITY Left 05/01/2021   Procedure: LEFT KNEE DEBRIDEMENT;  Surgeon: Nadara Mustard, MD;  Location: [facility] OR;  Service: Orthopedics;  Laterality: Left;    OB History     Gravida  4   Para  4   Term  4   Preterm      AB      Living  4      SAB      IAB      Ectopic      Multiple      Live Births  4            Home Medications    Prior to Admission medications   Medication Sig Start Date End Date Taking? Authorizing Provider  amLODipine (NORVASC) 5 MG tablet Take 1 tablet (5 mg total) by mouth daily. 11/17/22 11/12/23 Yes Saguier, Ramon Dredge, PA-C  aspirin 81 MG tablet Take 81 mg by mouth daily.   Yes [provider]   carvedilol (COREG) 25 MG tablet Take 1 tablet (25 mg total) by mouth 2 (two) times daily with a meal. 11/17/22  Yes Saguier, Ramon Dredge, PA-C  fluticasone (FLONASE) 50 MCG/ACT nasal spray Place 2 sprays into both nostrils daily. 01/13/22  Yes Saguier, Ramon Dredge, PA-C  gabapentin (NEURONTIN) 300 MG capsule TAKE 2 CAPSULES BY MOUTH THREE TIMES DAILY 08/12/22  Yes Saguier, Ramon Dredge, PA-C  albuterol (VENTOLIN HFA) 108 (90 Base) MCG/ACT inhaler Inhale 2 puffs into the lungs every 6 (six) hours as needed for wheezing or shortness of breath. Patient taking differently: Inhale 2 puffs into the lungs 3 (three) times daily as needed for wheezing or shortness of breath. 08/03/20   Saguier, Ramon Dredge, PA-C  amoxicillin-clavulanate (AUGMENTIN) 875-125 MG tablet Take 1 tablet by mouth every 12 (twelve) hours for 7 days. 01/21/23 01/28/23  Royanne Foots, DO  atorvastatin (LIPITOR) 80 MG tablet Take 1 tablet (80 mg total) by mouth daily. 01/20/23   Saguier, Ramon Dredge, PA-C  baclofen (LIORESAL) 10 MG tablet Take 1 tablet (10 mg total) by mouth 3 (three) times daily. 11/17/22   Saguier, Ramon Dredge, PA-C  busPIRone (BUSPAR) 15 MG tablet Take 1 tablet (15 mg total) by mouth 2 (two) times daily. Patient taking differently: Take 15 mg by mouth daily as needed (anxiety). 01/13/22   Saguier, Ramon Dredge, PA-C  Continuous Glucose Sensor (FREESTYLE LIBRE 3 SENSOR) MISC USE AS DIRECTED TO MONITOR BLOOD SUGAR. CHANGE EVERY 14 DAYS 08/07/22   [provider]  fluticasone furoate-vilanterol (BREO ELLIPTA) 100-25 MCG/INH AEPB Inhale 1 puff into the lungs daily. 08/03/20   Saguier, Ramon Dredge, PA-C  Insulin Glargine [facility]) 100 UNIT/ML INJECT 40 UNITS SUBCUTANEOUSLY AT BEDTIME 08/12/22   Saguier, Ramon Dredge, PA-C  Insulin Pen Needle (PEN NEEDLES 31GX5/16") 31G X 8 MM MISC Use daily as directed 03/28/22   Saguier, Ramon Dredge, PA-C  lidocaine (LIDODERM) 5 % Place 1 patch onto the skin daily. Remove & Discard patch within 12 hours or as directed by MD 11/08/22    Dorcas Carrow, MD  metFORMIN (GLUCOPHAGE-XR) 500 MG 24 hr tablet Take 1 tablet (500 mg total) by mouth daily with breakfast. 11/17/22   Saguier, Ramon Dredge, PA-C  metoCLOPramide (REGLAN) 5 MG tablet Take 1 tablet (5 mg total) by mouth 3 (three) times daily before meals. 11/08/22 12/08/22  Dorcas Carrow, MD  nitroGLYCERIN (NITROSTAT) 0.4 MG SL tablet Place 1 tablet (0.4  mg total) under the tongue every 5 (five) minutes as needed for chest pain. 03/21/21   Saguier, Ramon Dredge, PA-C  ondansetron (ZOFRAN) 4 MG tablet Take 4 mg by mouth every 8 (eight) hours as needed for nausea or vomiting. Patient not taking: Reported on 12/31/2022    [provider]  Oxycodone HCl 10 MG TABS 1 tab po bid prn severe pain. 11/12/22   Saguier, Ramon Dredge, PA-C  OZEMPIC, 0.25 OR 0.5 MG/DOSE, 2 MG/3ML SOPN Inject 0.5 mg into the skin once a week. mondays 10/14/22   [provider]  pantoprazole (PROTONIX) 40 MG tablet Take 1 tablet (40 mg total) by mouth 2 (two) times daily. 11/08/22 12/08/22  Dorcas Carrow, MD  promethazine (PHENERGAN) 12.5 MG tablet Take 1 tablet (12.5 mg total) by mouth every 8 (eight) hours as needed for nausea or vomiting. 01/08/23   Saguier, Ramon Dredge, PA-C  sucralfate (CARAFATE) 1 g tablet Take 1 tablet (1 g total) by mouth 4 (four) times daily. 11/08/22 11/08/23  Dorcas Carrow, MD  topiramate (TOPAMAX) 50 MG tablet Take 1 tablet (50 mg total) by mouth 2 (two) times daily. Patient taking differently: Take 50 mg by mouth 2 (two) times daily as needed (headache). 01/13/22   Saguier, Ramon Dredge, PA-C  VITAMIN D PO Take 5,000 Units by mouth daily.    [provider]    Family History Family History  Problem Relation Age of Onset   Breast cancer Mother    Stroke Mother    Hypertension Mother    Diabetes Mother    Hypertension Father    Diabetes Father    Heart attack Father    Hypertension Sister    Diabetes Sister    Hypertension Brother    Diabetes Brother    Hypertension Brother     Diabetes Brother    Stroke Maternal Grandmother    Colon cancer Neg Hx    Colon polyps Neg Hx    Esophageal cancer Neg Hx    Stomach cancer Neg Hx    Rectal cancer Neg Hx     Social History Social History   Tobacco Use   Smoking status: Former    Current packs/day: 0.00    Types: Cigarettes    Quit date: 1990    Years since quitting: 34.9   Smokeless tobacco: Never  Vaping Use   Vaping status: Never Used  Substance Use Topics   Alcohol use: Yes    Comment: occasionally   Drug use: No     Allergies   Lisinopril, Oxycodone-acetaminophen, Latex, and Morphine   Review of Systems Review of Systems  Constitutional:  Negative for chills and fever.  HENT:  Positive for dental problem.   Eyes:  Negative for discharge and redness.  Gastrointestinal:  Negative for abdominal pain, nausea and vomiting.     Physical Exam Triage Vital Signs ED Triage Vitals  Encounter Vitals Group     BP 01/17/23 1326 136/82     Systolic BP Percentile --      Diastolic BP Percentile --      Pulse Rate 01/17/23 1326 87     Resp 01/17/23 1326 18     Temp 01/17/23 1326 98.4 F (36.9 C)     Temp Source 01/17/23 1326 Oral     SpO2 01/17/23 1326 97 %     Weight 01/17/23 1324 189 lb (85.7 kg)     Height 01/17/23 1324 6' (1.829 m)     Head Circumference --  Peak Flow --      Pain Score 01/17/23 1322 10     Pain Loc --      Pain Education --      Exclude from Growth Chart --    No data found.  Updated Vital Signs BP 136/82 (BP Location: Left Arm)   Pulse 87   Temp 98.4 F (36.9 C) (Oral)   Resp 18   Ht 6' (1.829 m)   Wt 189 lb (85.7 kg)   SpO2 97%   BMI 25.63 kg/m   Visual Acuity Right Eye Distance:   Left Eye Distance:   Bilateral Distance:    Right Eye Near:   Left Eye Near:    Bilateral Near:     Physical Exam Vitals and nursing note reviewed.  Constitutional:      General: She is not in acute distress.    Appearance: Normal appearance. She is not  ill-appearing.  HENT:     Head: Normocephalic and atraumatic.     Mouth/Throat:     Comments: Gingival swelling noted around right upper molars Eyes:     Conjunctiva/sclera: Conjunctivae normal.  Cardiovascular:     Rate and Rhythm: Normal rate.  Pulmonary:     Effort: Pulmonary effort is normal.  Neurological:     Mental Status: She is alert.  Psychiatric:        Mood and Affect: Mood normal.        Behavior: Behavior normal.        Thought Content: Thought content normal.      UC Treatments / Results  Labs (all labs ordered are listed, but only abnormal results are displayed) Labs Reviewed - No data to display  EKG   Radiology No results found.  Procedures Procedures (including critical care time)  Medications Ordered in UC Medications - No data to display  Initial Impression / Assessment and Plan / UC Course  I have reviewed the triage vital signs and the nursing notes.  Pertinent labs & imaging results that were available during my care of the patient were reviewed by me and considered in my medical decision making (see chart for details).    Will treat to cover dental abscess with amoxicillin and recommended further evaluation by dentistry as soon as possible.  Encouraged follow-up with any worsening symptoms in the meantime.  Patient expressed understanding.  Final Clinical Impressions(s) / UC Diagnoses   Final diagnoses:  Dental abscess   Discharge Instructions   None    ED Prescriptions     Medication Sig Dispense Auth. Provider   amoxicillin (AMOXIL) 500 MG capsule Take 1 capsule (500 mg total) by mouth 3 (three) times daily. 21 capsule Tomi Bamberger, PA-C      PDMP not reviewed this encounter.   Tomi Bamberger, PA-C 01/21/23 [phone]

## 2023-01-17 NOTE — ED Triage Notes (Signed)
"  I am having a bad tooth ache on upper right side of my mouth". "This started a few days ago but no like this, I can feel like an abscess above it on the gum with some facial swelling on right side". No fever.

## 2023-01-19 ENCOUNTER — Telehealth: Payer: Self-pay | Admitting: Medical

## 2023-01-19 NOTE — Telephone Encounter (Signed)
Prescription Request  01/19/2023  Is this a "Controlled Substance" medicine? No  LOV: 12/31/2022  What is the name of the medication or equipment? atorvastatin (LIPITOR) 80 MG tablet  Have you contacted your pharmacy to request a refill? No   Which pharmacy would you like this sent to?  [facility] [address] ([address]), [place] - [address] [place]) [place] [address] Phone: [phone] Fax: [phone]     Patient notified that their request is being sent to the clinical staff for review and that they should receive a response within 2 business days.   Please advise at [facility] [phone]

## 2023-01-20 MED ORDER — ATORVASTATIN CALCIUM 80 MG PO TABS: 80.0000 mg | ORAL_TABLET | Freq: Every day | ORAL | 3 refills | Status: DC

## 2023-01-20 NOTE — Addendum Note (Signed)
Addended by: Maximino Sarin on: 01/20/2023 08:17 AM   Modules accepted: Orders

## 2023-01-20 NOTE — Telephone Encounter (Signed)
Rx sent 

## 2023-01-21 ENCOUNTER — Other Ambulatory Visit (HOSPITAL_BASED_OUTPATIENT_CLINIC_OR_DEPARTMENT_OTHER): Payer: Self-pay

## 2023-01-21 ENCOUNTER — Other Ambulatory Visit: Payer: Self-pay

## 2023-01-21 ENCOUNTER — Emergency Department (HOSPITAL_BASED_OUTPATIENT_CLINIC_OR_DEPARTMENT_OTHER)
Admission: EM | Admit: 2023-01-21 | Discharge: 2023-01-21 | Disposition: A | Discharge status: Home / Self Care | Payer: Commercial Managed Care - HMO | Attending: Emergency Medicine | Admitting: Emergency Medicine

## 2023-01-21 ENCOUNTER — Encounter (HOSPITAL_BASED_OUTPATIENT_CLINIC_OR_DEPARTMENT_OTHER): Payer: Self-pay | Admitting: Emergency Medicine

## 2023-01-21 DIAGNOSIS — E119 Type 2 diabetes mellitus without complications: Secondary | ICD-10-CM | POA: Diagnosis not present

## 2023-01-21 DIAGNOSIS — Z7982 Long term (current) use of aspirin: Secondary | ICD-10-CM | POA: Diagnosis not present

## 2023-01-21 DIAGNOSIS — K0889 Other specified disorders of teeth and supporting structures: Secondary | ICD-10-CM | POA: Diagnosis present

## 2023-01-21 DIAGNOSIS — Z7984 Long term (current) use of oral hypoglycemic drugs: Secondary | ICD-10-CM | POA: Diagnosis not present

## 2023-01-21 DIAGNOSIS — Z794 Long term (current) use of insulin: Secondary | ICD-10-CM | POA: Insufficient documentation

## 2023-01-21 DIAGNOSIS — K047 Periapical abscess without sinus: Secondary | ICD-10-CM | POA: Insufficient documentation

## 2023-01-21 DIAGNOSIS — K0381 Cracked tooth: Secondary | ICD-10-CM | POA: Insufficient documentation

## 2023-01-21 DIAGNOSIS — Z91040 Latex allergy status: Secondary | ICD-10-CM | POA: Diagnosis not present

## 2023-01-21 MED ORDER — BUPIVACAINE HCL (PF) 0.5 % IJ SOLN
10.0000 mL | Freq: Once | INTRAMUSCULAR | Status: AC
  Administered 2023-01-21: 10 mL
  Filled 2023-01-21: qty 10

## 2023-01-21 MED ORDER — LIDOCAINE VISCOUS HCL 2 % MT SOLN
15.0000 mL | Freq: Once | OROMUCOSAL | Status: AC
  Administered 2023-01-21: 15 mL via OROMUCOSAL
  Filled 2023-01-21: qty 15

## 2023-01-21 MED ORDER — AMOXICILLIN-POT CLAVULANATE 875-125 MG PO TABS
1.0000 | ORAL_TABLET | Freq: Two times a day (BID) | ORAL | 0 refills | Status: AC
  Filled 2023-01-21: qty 14, 7d supply, fill #0

## 2023-01-21 NOTE — ED Notes (Signed)
Dr Mayford Knife called at [phone]

## 2023-01-21 NOTE — ED Triage Notes (Signed)
Pt c/o R dental pain since Saturday. Seen at [facility] Saturday and sent home with ABX. C/o increased facial swelling and abscess.

## 2023-01-21 NOTE — ED Notes (Signed)
Pt given discharge instructions. MD spoke with patient and she is to follow-up with dental referral. Prescriptions reviewed with pt.  Opportunities given for questions. Pt verbalizes understanding. Jillyn Hidden, RN

## 2023-01-21 NOTE — Discharge Instructions (Addendum)
You were seen in the emergency room for dental abscess We provided a nerve block which is only temporary relief of the pain Please pick up the new antibiotic from your pharmacy (Augmentin) begin taking this as directed and stop taking the amoxicillin It is important that you follow-up with Dr. Mayford Knife at the number listed to make an appointment to be seen in the office to have this tooth extracted This is the only way we will get better Return to the emergency department for fevers, severe pain or any other concerns

## 2023-01-21 NOTE — ED Provider Notes (Addendum)
[place] EMERGENCY DEPARTMENT AT [facility] Provider Note   CSN: [id number] Arrival date & time: 01/21/23  8295     History  Chief Complaint  Patient presents with   Dental Pain    [name] is a 57 y.o. female.  With a history of dental abscess and diabetes who presents to the ED for dental pain.  Patient has known dental abscess over tooth #6.  She was seen at urgent care 5 days ago and started on antibiotics (amoxicillin) for this reason.  Presents today with persistent dental pain.  Notes mild nausea.  No fevers chills or other complaints.   Dental Pain      Home Medications Prior to Admission medications   Medication Sig Start Date End Date Taking? Authorizing Provider  amoxicillin-clavulanate (AUGMENTIN) 875-125 MG tablet Take 1 tablet by mouth every 12 (twelve) hours for 7 days. 01/21/23 01/28/23 Yes Royanne Foots, DO  albuterol (VENTOLIN HFA) 108 (90 Base) MCG/ACT inhaler Inhale 2 puffs into the lungs every 6 (six) hours as needed for wheezing or shortness of breath. Patient taking differently: Inhale 2 puffs into the lungs 3 (three) times daily as needed for wheezing or shortness of breath. 08/03/20   Saguier, Ramon Dredge, PA-C  amLODipine (NORVASC) 5 MG tablet Take 1 tablet (5 mg total) by mouth daily. 11/17/22 11/12/23  Saguier, Ramon Dredge, PA-C  aspirin 81 MG tablet Take 81 mg by mouth daily.    [provider]  atorvastatin (LIPITOR) 80 MG tablet Take 1 tablet (80 mg total) by mouth daily. 01/20/23   Saguier, Ramon Dredge, PA-C  baclofen (LIORESAL) 10 MG tablet Take 1 tablet (10 mg total) by mouth 3 (three) times daily. 11/17/22   Saguier, Ramon Dredge, PA-C  busPIRone (BUSPAR) 15 MG tablet Take 1 tablet (15 mg total) by mouth 2 (two) times daily. Patient taking differently: Take 15 mg by mouth daily as needed (anxiety). 01/13/22   Saguier, Ramon Dredge, PA-C  carvedilol (COREG) 25 MG tablet Take 1 tablet (25 mg total) by mouth 2 (two) times daily with a meal.  11/17/22   Saguier, Ramon Dredge, PA-C  Continuous Glucose Sensor (FREESTYLE LIBRE 3 SENSOR) MISC USE AS DIRECTED TO MONITOR BLOOD SUGAR. CHANGE EVERY 14 DAYS 08/07/22   [provider]  fluticasone (FLONASE) 50 MCG/ACT nasal spray Place 2 sprays into both nostrils daily. 01/13/22   Saguier, Ramon Dredge, PA-C  fluticasone furoate-vilanterol (BREO ELLIPTA) 100-25 MCG/INH AEPB Inhale 1 puff into the lungs daily. 08/03/20   Saguier, Ramon Dredge, PA-C  gabapentin (NEURONTIN) 300 MG capsule TAKE 2 CAPSULES BY MOUTH THREE TIMES DAILY 08/12/22   Saguier, Ramon Dredge, PA-C  Insulin Glargine [facility]) 100 UNIT/ML INJECT 40 UNITS SUBCUTANEOUSLY AT BEDTIME 08/12/22   Saguier, Ramon Dredge, PA-C  Insulin Pen Needle (PEN NEEDLES 31GX5/16") 31G X 8 MM MISC Use daily as directed 03/28/22   Saguier, Ramon Dredge, PA-C  lidocaine (LIDODERM) 5 % Place 1 patch onto the skin daily. Remove & Discard patch within 12 hours or as directed by MD 11/08/22   Dorcas Carrow, MD  metFORMIN (GLUCOPHAGE-XR) 500 MG 24 hr tablet Take 1 tablet (500 mg total) by mouth daily with breakfast. 11/17/22   Saguier, Ramon Dredge, PA-C  metoCLOPramide (REGLAN) 5 MG tablet Take 1 tablet (5 mg total) by mouth 3 (three) times daily before meals. 11/08/22 12/08/22  Dorcas Carrow, MD  nitroGLYCERIN (NITROSTAT) 0.4 MG SL tablet Place 1 tablet (0.4 mg total) under the tongue every 5 (five) minutes as needed for chest pain. 03/21/21   Saguier, Ramon Dredge,  PA-C  ondansetron (ZOFRAN) 4 MG tablet Take 4 mg by mouth every 8 (eight) hours as needed for nausea or vomiting. Patient not taking: Reported on 12/31/2022    [provider]  Oxycodone HCl 10 MG TABS 1 tab po bid prn severe pain. 11/12/22   Saguier, Ramon Dredge, PA-C  OZEMPIC, 0.25 OR 0.5 MG/DOSE, 2 MG/3ML SOPN Inject 0.5 mg into the skin once a week. mondays 10/14/22   [provider]  pantoprazole (PROTONIX) 40 MG tablet Take 1 tablet (40 mg total) by mouth 2 (two) times daily. 11/08/22 12/08/22  Dorcas Carrow, MD   promethazine (PHENERGAN) 12.5 MG tablet Take 1 tablet (12.5 mg total) by mouth every 8 (eight) hours as needed for nausea or vomiting. 01/08/23   Saguier, Ramon Dredge, PA-C  sucralfate (CARAFATE) 1 g tablet Take 1 tablet (1 g total) by mouth 4 (four) times daily. 11/08/22 11/08/23  Dorcas Carrow, MD  topiramate (TOPAMAX) 50 MG tablet Take 1 tablet (50 mg total) by mouth 2 (two) times daily. Patient taking differently: Take 50 mg by mouth 2 (two) times daily as needed (headache). 01/13/22   Saguier, Ramon Dredge, PA-C  VITAMIN D PO Take 5,000 Units by mouth daily.    [provider]      Allergies    Lisinopril, Oxycodone-acetaminophen, Latex, and Morphine    Review of Systems   Review of Systems  Physical Exam Updated Vital Signs BP (!) 148/99 (BP Location: Left Arm)   Pulse 93   Temp 98.4 F (36.9 C) (Oral)   Resp 16   Ht 6' (1.829 m)   Wt 86.2 kg   SpO2 98%   BMI 25.77 kg/m  Physical Exam Vitals and nursing note reviewed.  HENT:     Head: Normocephalic and atraumatic.     Mouth/Throat:     Comments: Tenderness with abscess over tooth #6 Poor dentition Eyes:     Pupils: Pupils are equal, round, and reactive to light.  Cardiovascular:     Rate and Rhythm: Normal rate and regular rhythm.  Pulmonary:     Effort: Pulmonary effort is normal.     Breath sounds: Normal breath sounds.  Abdominal:     Palpations: Abdomen is soft.     Tenderness: There is no abdominal tenderness.  Skin:    General: Skin is warm and dry.  Neurological:     Mental Status: She is alert.  Psychiatric:        Mood and Affect: Mood normal.     ED Results / Procedures / Treatments   Labs (all labs ordered are listed, but only abnormal results are displayed) Labs Reviewed - No data to display  EKG None  Radiology No results found.  Procedures Dental Block  Date/Time: 01/21/2023 8:27 AM  Performed by: Royanne Foots, DO Authorized by: Royanne Foots, DO   Consent:    Consent  obtained:  Verbal   Consent given by:  Patient   Risks, benefits, and alternatives were discussed: yes     Risks discussed:  Allergic reaction, hematoma, intravascular injection, pain, unsuccessful block, nerve damage, swelling and infection   Alternatives discussed:  No treatment and alternative treatment Universal protocol:    Procedure explained and questions answered to patient or proxy's satisfaction: yes     Immediately prior to procedure, a time out was called: yes     Patient identity confirmed:  Verbally with patient Indications:    Indications: dental abscess   Location:    Anesthesia block  type: Infraorbital. Procedure details:    Topical anesthetic:  Lidocaine gel   Syringe type:  Aspirating dental syringe   Needle gauge:  25 G   Anesthetic injected:  Bupivacaine 0.25% w/o epi   Injection procedure:  Anatomic landmarks identified and negative aspiration for blood Post-procedure details:    Outcome:  Anesthesia achieved   Procedure completion:  Tolerated     Medications Ordered in ED Medications  bupivacaine(PF) (MARCAINE) 0.5 % injection 10 mL (10 mLs Infiltration Given 01/21/23 0756)  lidocaine (XYLOCAINE) 2 % viscous mouth solution 15 mL (15 mLs Mouth/Throat Given 01/21/23 0756)    ED Course/ Medical Decision Making/ A&P Clinical Course as of 01/21/23 0848  Wed Jan 21, 2023  0846 Discussed with Dr. Mayford Knife (dentistry) who recommends changing her antibiotic from amoxicillin to Augmentin.  He will see her in the office either later this afternoon or tomorrow for definitive management of dental abscess.  We appreciate his timely consult [MP]    Clinical Course User Index [MP] Royanne Foots, DO                                 Medical Decision Making 57 year old female with history of diabetes and poor dentition presenting for dental abscess.  Was seen in urgent care a few days ago and started on amoxicillin.  Presenting with dental pain over tooth #6.  Some mild  nausea which may be from the antibiotics.  No other systemic signs of severe infection.  I provided a dental block with bupivacaine which seem to help but instructed the patient that she will need to follow-up with a dentist for definitive management of abscessed tooth.  Will provide her with dental referral.  She has amoxicillin left to continue taking.  She already has prescribed oxycodone which she can take for pain in addition to over-the-counter pain medicines.  Stable for discharge  Risk Prescription drug management.           Final Clinical Impression(s) / ED Diagnoses Final diagnoses:  Dental abscess    Rx / DC Orders ED Discharge Orders          Ordered    amoxicillin-clavulanate (AUGMENTIN) 875-125 MG tablet  Every 12 hours        01/21/23 0847              Royanne Foots, DO 01/21/23 0836    Royanne Foots, DO 01/21/23 [phone]

## 2023-01-29 ENCOUNTER — Other Ambulatory Visit: Payer: Self-pay

## 2023-01-29 ENCOUNTER — Encounter (HOSPITAL_BASED_OUTPATIENT_CLINIC_OR_DEPARTMENT_OTHER): Payer: Self-pay

## 2023-01-29 ENCOUNTER — Emergency Department (HOSPITAL_BASED_OUTPATIENT_CLINIC_OR_DEPARTMENT_OTHER): Payer: Commercial Managed Care - HMO | Admitting: Radiology

## 2023-01-29 ENCOUNTER — Other Ambulatory Visit (HOSPITAL_BASED_OUTPATIENT_CLINIC_OR_DEPARTMENT_OTHER): Payer: Self-pay

## 2023-01-29 ENCOUNTER — Emergency Department (HOSPITAL_BASED_OUTPATIENT_CLINIC_OR_DEPARTMENT_OTHER)
Admission: EM | Admit: 2023-01-29 | Discharge: 2023-01-29 | Disposition: A | Discharge status: Home / Self Care | Payer: Commercial Managed Care - HMO | Source: Home / Self Care | Attending: Emergency Medicine | Admitting: Emergency Medicine

## 2023-01-29 ENCOUNTER — Emergency Department (HOSPITAL_BASED_OUTPATIENT_CLINIC_OR_DEPARTMENT_OTHER): Payer: Commercial Managed Care - HMO

## 2023-01-29 DIAGNOSIS — Z7951 Long term (current) use of inhaled steroids: Secondary | ICD-10-CM | POA: Insufficient documentation

## 2023-01-29 DIAGNOSIS — Z8541 Personal history of malignant neoplasm of cervix uteri: Secondary | ICD-10-CM | POA: Insufficient documentation

## 2023-01-29 DIAGNOSIS — E1122 Type 2 diabetes mellitus with diabetic chronic kidney disease: Secondary | ICD-10-CM | POA: Insufficient documentation

## 2023-01-29 DIAGNOSIS — N183 Chronic kidney disease, stage 3 unspecified: Secondary | ICD-10-CM | POA: Insufficient documentation

## 2023-01-29 DIAGNOSIS — R0789 Other chest pain: Secondary | ICD-10-CM | POA: Insufficient documentation

## 2023-01-29 DIAGNOSIS — Z91040 Latex allergy status: Secondary | ICD-10-CM | POA: Insufficient documentation

## 2023-01-29 DIAGNOSIS — R109 Unspecified abdominal pain: Secondary | ICD-10-CM | POA: Insufficient documentation

## 2023-01-29 DIAGNOSIS — I2693 Single subsegmental pulmonary embolism without acute cor pulmonale: Secondary | ICD-10-CM

## 2023-01-29 DIAGNOSIS — Z7984 Long term (current) use of oral hypoglycemic drugs: Secondary | ICD-10-CM | POA: Insufficient documentation

## 2023-01-29 DIAGNOSIS — Z1152 Encounter for screening for COVID-19: Secondary | ICD-10-CM | POA: Insufficient documentation

## 2023-01-29 DIAGNOSIS — R000 Tachycardia, unspecified: Secondary | ICD-10-CM | POA: Insufficient documentation

## 2023-01-29 DIAGNOSIS — I2510 Atherosclerotic heart disease of native coronary artery without angina pectoris: Secondary | ICD-10-CM | POA: Insufficient documentation

## 2023-01-29 DIAGNOSIS — R0602 Shortness of breath: Secondary | ICD-10-CM | POA: Insufficient documentation

## 2023-01-29 DIAGNOSIS — I129 Hypertensive chronic kidney disease with stage 1 through stage 4 chronic kidney disease, or unspecified chronic kidney disease: Secondary | ICD-10-CM | POA: Insufficient documentation

## 2023-01-29 DIAGNOSIS — J189 Pneumonia, unspecified organism: Secondary | ICD-10-CM

## 2023-01-29 DIAGNOSIS — J45909 Unspecified asthma, uncomplicated: Secondary | ICD-10-CM | POA: Insufficient documentation

## 2023-01-29 DIAGNOSIS — Z794 Long term (current) use of insulin: Secondary | ICD-10-CM | POA: Insufficient documentation

## 2023-01-29 DIAGNOSIS — M549 Dorsalgia, unspecified: Secondary | ICD-10-CM | POA: Insufficient documentation

## 2023-01-29 DIAGNOSIS — Z7982 Long term (current) use of aspirin: Secondary | ICD-10-CM | POA: Insufficient documentation

## 2023-01-29 DIAGNOSIS — Z79899 Other long term (current) drug therapy: Secondary | ICD-10-CM | POA: Insufficient documentation

## 2023-01-29 LAB — CBC WITH DIFFERENTIAL/PLATELET
Abs Immature Granulocytes: 0.02 10*3/uL (ref 0.00–0.07)
Basophils Absolute: 0 10*3/uL (ref 0.0–0.1)
Basophils Relative: 0 %
Eosinophils Absolute: 0.5 10*3/uL (ref 0.0–0.5)
Eosinophils Relative: 6 %
HCT: 36.5 % (ref 36.0–46.0)
Hemoglobin: 11.6 g/dL — ABNORMAL LOW (ref 12.0–15.0)
Immature Granulocytes: 0 %
Lymphocytes Relative: 26 %
Lymphs Abs: 2.4 10*3/uL (ref 0.7–4.0)
MCH: 28.6 pg (ref 26.0–34.0)
MCHC: 31.8 g/dL (ref 30.0–36.0)
MCV: 90.1 fL (ref 80.0–100.0)
Monocytes Absolute: 0.8 10*3/uL (ref 0.1–1.0)
Monocytes Relative: 9 %
Neutro Abs: 5.5 10*3/uL (ref 1.7–7.7)
Neutrophils Relative %: 59 %
Platelets: 477 10*3/uL — ABNORMAL HIGH (ref 150–400)
RBC: 4.05 MIL/uL (ref 3.87–5.11)
RDW: 14.7 % (ref 11.5–15.5)
WBC: 9.2 10*3/uL (ref 4.0–10.5)
nRBC: 0 % (ref 0.0–0.2)

## 2023-01-29 LAB — URINALYSIS, ROUTINE W REFLEX MICROSCOPIC
Bacteria, UA: NONE SEEN
Bilirubin Urine: NEGATIVE
Glucose, UA: NEGATIVE mg/dL
Hgb urine dipstick: NEGATIVE
Ketones, ur: NEGATIVE mg/dL
Leukocytes,Ua: NEGATIVE
Nitrite: NEGATIVE
Protein, ur: 30 mg/dL — AB
Specific Gravity, Urine: 1.033 — ABNORMAL HIGH (ref 1.005–1.030)
pH: 6 (ref 5.0–8.0)

## 2023-01-29 LAB — D-DIMER, QUANTITATIVE: D-Dimer, Quant: 1.3 ug{FEU}/mL — ABNORMAL HIGH (ref 0.00–0.50)

## 2023-01-29 LAB — COMPREHENSIVE METABOLIC PANEL
ALT: 17 U/L (ref 0–44)
AST: 14 U/L — ABNORMAL LOW (ref 15–41)
Albumin: 3.5 g/dL (ref 3.5–5.0)
Alkaline Phosphatase: 94 U/L (ref 38–126)
Anion gap: 8 (ref 5–15)
BUN: 17 mg/dL (ref 6–20)
CO2: 28 mmol/L (ref 22–32)
Calcium: 9.1 mg/dL (ref 8.9–10.3)
Chloride: 106 mmol/L (ref 98–111)
Creatinine, Ser: 1.27 mg/dL — ABNORMAL HIGH (ref 0.44–1.00)
GFR, Estimated: 49 mL/min — ABNORMAL LOW (ref >60)
Glucose, Bld: 91 mg/dL (ref 70–99)
Potassium: 3.9 mmol/L (ref 3.5–5.1)
Sodium: 142 mmol/L (ref 135–145)
Total Bilirubin: 0.4 mg/dL (ref <1.2)
Total Protein: 7.2 g/dL (ref 6.5–8.1)

## 2023-01-29 LAB — RESP PANEL BY RT-PCR (RSV, FLU A&B, COVID)  RVPGX2
Influenza A by PCR: NEGATIVE
Influenza B by PCR: NEGATIVE
Resp Syncytial Virus by PCR: NEGATIVE
SARS Coronavirus 2 by RT PCR: NEGATIVE

## 2023-01-29 LAB — TROPONIN I (HIGH SENSITIVITY)
Troponin I (High Sensitivity): 3 ng/L (ref <18)
Troponin I (High Sensitivity): 3 ng/L (ref <18)

## 2023-01-29 LAB — LIPASE, BLOOD: Lipase: 16 U/L (ref 11–51)

## 2023-01-29 MED ORDER — IOHEXOL 350 MG/ML SOLN
100.0000 mL | Freq: Once | INTRAVENOUS | Status: AC | PRN
  Administered 2023-01-29: 75 mL via INTRAVENOUS

## 2023-01-29 MED ORDER — KETOROLAC TROMETHAMINE 30 MG/ML IJ SOLN
30.0000 mg | Freq: Once | INTRAMUSCULAR | Status: AC
  Administered 2023-01-29: 30 mg via INTRAVENOUS
  Filled 2023-01-29: qty 1

## 2023-01-29 MED ORDER — APIXABAN (ELIQUIS) EDUCATION KIT FOR DVT/PE PATIENTS: PACK | Freq: Once | Status: AC

## 2023-01-29 MED ORDER — DOXYCYCLINE HYCLATE 100 MG PO CAPS
100.0000 mg | ORAL_CAPSULE | Freq: Two times a day (BID) | ORAL | 0 refills | Status: DC
  Filled 2023-01-29: qty 14, 7d supply, fill #0

## 2023-01-29 MED ORDER — DOXYCYCLINE HYCLATE 100 MG PO TABS
100.0000 mg | ORAL_TABLET | Freq: Once | ORAL | Status: AC
  Administered 2023-01-29: 100 mg via ORAL
  Filled 2023-01-29: qty 1

## 2023-01-29 MED ORDER — OXYCODONE HCL 5 MG PO TABS
5.0000 mg | ORAL_TABLET | ORAL | 0 refills | Status: DC | PRN
  Filled 2023-01-29: qty 30, 5d supply, fill #0

## 2023-01-29 MED ORDER — HYDROMORPHONE HCL 1 MG/ML IJ SOLN
0.5000 mg | Freq: Once | INTRAMUSCULAR | Status: AC
Start: 2023-01-29 — End: 2023-01-29
  Administered 2023-01-29: 0.5 mg via INTRAVENOUS
  Filled 2023-01-29: qty 1

## 2023-01-29 MED ORDER — APIXABAN 5 MG PO TABS
ORAL_TABLET | ORAL | 0 refills | Status: DC
  Filled 2023-01-29: qty 60, 30d supply, fill #0

## 2023-01-29 MED ORDER — APIXABAN 2.5 MG PO TABS
10.0000 mg | ORAL_TABLET | Freq: Once | ORAL | Status: AC
  Administered 2023-01-29: 10 mg via ORAL
  Filled 2023-01-29: qty 4

## 2023-01-29 MED ORDER — ONDANSETRON HCL 4 MG/2ML IJ SOLN
4.0000 mg | Freq: Once | INTRAMUSCULAR | Status: AC
  Administered 2023-01-29: 4 mg via INTRAVENOUS
  Filled 2023-01-29: qty 2

## 2023-01-29 MED ORDER — HYDROMORPHONE HCL 1 MG/ML IJ SOLN
0.5000 mg | Freq: Once | INTRAMUSCULAR | Status: AC
  Administered 2023-01-29: 0.5 mg via INTRAVENOUS
  Filled 2023-01-29: qty 1

## 2023-01-29 MED ORDER — FENTANYL CITRATE PF 50 MCG/ML IJ SOSY
100.0000 ug | PREFILLED_SYRINGE | Freq: Once | INTRAMUSCULAR | Status: AC
  Administered 2023-01-29: 100 ug via INTRAVENOUS
  Filled 2023-01-29: qty 2

## 2023-01-29 NOTE — ED Notes (Signed)
Eliquis patient education book reviewed with patient, answered questions. Voiced understanding. Dc papers given and reviewed as well, pt  voiced understanding.

## 2023-01-29 NOTE — ED Provider Notes (Signed)
[place] EMERGENCY DEPARTMENT AT [facility] Provider Note   CSN: [id number] Arrival date & time: 01/29/23  4782     History  Chief Complaint  Patient presents with   Shortness of Breath    [name] is a 57 y.o. female.  HPI     This is a 57 year old female who presents with flank pain and shortness of breath.  Patient reports pain in the posterior left back and flank.  Denies any injury or heavy lifting.  She states pain is worse with deep breathing and laying on it.  She has developed some shortness of breath and "I cannot catch my breath."  Denies fevers.  She did develop a cough yesterday.  No known sick contacts.  Patient denies hematuria or dysuria.  Home Medications Prior to Admission medications   Medication Sig Start Date End Date Taking? Authorizing Provider  albuterol (VENTOLIN HFA) 108 (90 Base) MCG/ACT inhaler Inhale 2 puffs into the lungs every 6 (six) hours as needed for wheezing or shortness of breath. Patient taking differently: Inhale 2 puffs into the lungs 3 (three) times daily as needed for wheezing or shortness of breath. 08/03/20   Saguier, Ramon Dredge, PA-C  amLODipine (NORVASC) 5 MG tablet Take 1 tablet (5 mg total) by mouth daily. 11/17/22 11/12/23  Saguier, Ramon Dredge, PA-C  aspirin 81 MG tablet Take 81 mg by mouth daily.    [provider]  atorvastatin (LIPITOR) 80 MG tablet Take 1 tablet (80 mg total) by mouth daily. 01/20/23   Saguier, Ramon Dredge, PA-C  baclofen (LIORESAL) 10 MG tablet Take 1 tablet (10 mg total) by mouth 3 (three) times daily. 11/17/22   Saguier, Ramon Dredge, PA-C  busPIRone (BUSPAR) 15 MG tablet Take 1 tablet (15 mg total) by mouth 2 (two) times daily. Patient taking differently: Take 15 mg by mouth daily as needed (anxiety). 01/13/22   Saguier, Ramon Dredge, PA-C  carvedilol (COREG) 25 MG tablet Take 1 tablet (25 mg total) by mouth 2 (two) times daily with a meal. 11/17/22   Saguier, Ramon Dredge, PA-C  Continuous Glucose Sensor  (FREESTYLE LIBRE 3 SENSOR) MISC USE AS DIRECTED TO MONITOR BLOOD SUGAR. CHANGE EVERY 14 DAYS 08/07/22   [provider]  fluticasone (FLONASE) 50 MCG/ACT nasal spray Place 2 sprays into both nostrils daily. 01/13/22   Saguier, Ramon Dredge, PA-C  fluticasone furoate-vilanterol (BREO ELLIPTA) 100-25 MCG/INH AEPB Inhale 1 puff into the lungs daily. 08/03/20   Saguier, Ramon Dredge, PA-C  gabapentin (NEURONTIN) 300 MG capsule TAKE 2 CAPSULES BY MOUTH THREE TIMES DAILY 08/12/22   Saguier, Ramon Dredge, PA-C  Insulin Glargine [facility]) 100 UNIT/ML INJECT 40 UNITS SUBCUTANEOUSLY AT BEDTIME 08/12/22   Saguier, Ramon Dredge, PA-C  Insulin Pen Needle (PEN NEEDLES 31GX5/16") 31G X 8 MM MISC Use daily as directed 03/28/22   Saguier, Ramon Dredge, PA-C  lidocaine (LIDODERM) 5 % Place 1 patch onto the skin daily. Remove & Discard patch within 12 hours or as directed by MD 11/08/22   Dorcas Carrow, MD  metFORMIN (GLUCOPHAGE-XR) 500 MG 24 hr tablet Take 1 tablet (500 mg total) by mouth daily with breakfast. 11/17/22   Saguier, Ramon Dredge, PA-C  metoCLOPramide (REGLAN) 5 MG tablet Take 1 tablet (5 mg total) by mouth 3 (three) times daily before meals. 11/08/22 12/08/22  Dorcas Carrow, MD  nitroGLYCERIN (NITROSTAT) 0.4 MG SL tablet Place 1 tablet (0.4 mg total) under the tongue every 5 (five) minutes as needed for chest pain. 03/21/21   Saguier, Ramon Dredge, PA-C  ondansetron (ZOFRAN) 4 MG tablet Take  4 mg by mouth every 8 (eight) hours as needed for nausea or vomiting. Patient not taking: Reported on 12/31/2022    [provider]  Oxycodone HCl 10 MG TABS 1 tab po bid prn severe pain. 11/12/22   Saguier, Ramon Dredge, PA-C  OZEMPIC, 0.25 OR 0.5 MG/DOSE, 2 MG/3ML SOPN Inject 0.5 mg into the skin once a week. mondays 10/14/22   [provider]  pantoprazole (PROTONIX) 40 MG tablet Take 1 tablet (40 mg total) by mouth 2 (two) times daily. 11/08/22 12/08/22  Dorcas Carrow, MD  promethazine (PHENERGAN) 12.5 MG tablet Take 1 tablet (12.5 mg  total) by mouth every 8 (eight) hours as needed for nausea or vomiting. 01/08/23   Saguier, Ramon Dredge, PA-C  sucralfate (CARAFATE) 1 g tablet Take 1 tablet (1 g total) by mouth 4 (four) times daily. 11/08/22 11/08/23  Dorcas Carrow, MD  topiramate (TOPAMAX) 50 MG tablet Take 1 tablet (50 mg total) by mouth 2 (two) times daily. Patient taking differently: Take 50 mg by mouth 2 (two) times daily as needed (headache). 01/13/22   Saguier, Ramon Dredge, PA-C  VITAMIN D PO Take 5,000 Units by mouth daily.    [provider]      Allergies    Lisinopril, Oxycodone-acetaminophen, Latex, and Morphine    Review of Systems   Review of Systems  Constitutional:  Negative for fever.  Respiratory:  Positive for cough and shortness of breath.   Genitourinary:  Positive for flank pain.  All other systems reviewed and are negative.   Physical Exam Updated Vital Signs BP (!) 146/99   Pulse (!) 104   Temp 98.8 F (37.1 C) (Oral)   Resp 19   Ht 1.829 m (6')   Wt 81.6 kg   SpO2 93%   BMI 24.41 kg/m  Physical Exam Vitals and nursing note reviewed.  Constitutional:      Appearance: She is well-developed. She is not ill-appearing.  HENT:     Head: Normocephalic and atraumatic.  Eyes:     Pupils: Pupils are equal, round, and reactive to light.  Cardiovascular:     Rate and Rhythm: Normal rate and regular rhythm.     Heart sounds: Normal heart sounds.  Pulmonary:     Effort: Pulmonary effort is normal. Tachypnea present. No respiratory distress.     Breath sounds: No wheezing.  Chest:     Chest wall: Tenderness present.     Comments: Posterior left chest wall tenderness to palpation, no overlying skin changes or crepitus Abdominal:     General: Bowel sounds are normal.     Palpations: Abdomen is soft.  Musculoskeletal:     Cervical back: Neck supple.  Skin:    General: Skin is warm and dry.  Neurological:     Mental Status: She is alert and oriented to person, place, and time.     ED  Results / Procedures / Treatments   Labs (all labs ordered are listed, but only abnormal results are displayed) Labs Reviewed  CBC WITH DIFFERENTIAL/PLATELET  COMPREHENSIVE METABOLIC PANEL  LIPASE, BLOOD  D-DIMER, QUANTITATIVE  URINALYSIS, ROUTINE W REFLEX MICROSCOPIC  TROPONIN I (HIGH SENSITIVITY)    EKG EKG Interpretation Date/Time:  Thursday January 29 2023 06:14:50 EST Ventricular Rate:  104 PR Interval:  157 QRS Duration:  77 QT Interval:  346 QTC Calculation: 456 R Axis:   35  Text Interpretation: Sinus tachycardia Confirmed by Ross Marcus ([id number]) on 01/29/2023 6:30:35 AM  Radiology No results found.  Procedures Procedures  Medications Ordered in ED Medications  ondansetron (ZOFRAN) injection 4 mg (has no administration in time range)  fentaNYL (SUBLIMAZE) injection 100 mcg (has no administration in time range)    ED Course/ Medical Decision Making/ A&P                                 Medical Decision Making Amount and/or Complexity of Data Reviewed Labs: ordered.  Risk Prescription drug management.   This patient presents to the ED for concern of shortness of breath, flank pain, this involves an extensive number of treatment options, and is a complaint that carries with it a high risk of complications and morbidity.  I considered the following differential and admission for this acute, potentially life threatening condition.  The differential diagnosis includes pneumonia, pneumothorax, PE, musculoskeletal etiology  MDM:    This is a 57 year old female who presents with left posterior flank pain.  Also reports some shortness of breath.  She is overall nontoxic and vital signs are reassuring.  She is in no respiratory distress.  She is mildly tachycardic.  EKG without arrhythmia or ischemia.  Lab work and chest x-ray ordered.  Patient given pain medication.  Patient signed out pending workup.  (Labs, imaging, consults)  Labs: I Ordered, and  personally interpreted labs.  The pertinent results include: Ordered and pending  Imaging Studies ordered: I ordered imaging studies including chest x-ray I independently visualized and interpreted imaging. I agree with the radiologist interpretation  Additional history obtained from chart review.  External records from outside source obtained and reviewed including prior evaluations  Cardiac Monitoring: The patient was maintained on a cardiac monitor.  If on the cardiac monitor, I personally viewed and interpreted the cardiac monitored which showed an underlying rhythm of: Sinus  Reevaluation: After the interventions noted above, I reevaluated the patient and found that they have :stayed the same  Social Determinants of Health:  lives independently  Disposition: Pending  Co morbidities that complicate the patient evaluation  Past Medical History:  Diagnosis Date   Abnormal Pap smear of cervix    Anxiety 01/22/2016   Asthma    Cardiac murmur 02/01/2019   Cardiomyopathy, secondary (HCC) 09/06/2009   Qualifier: Diagnosis of  By: Jolene Provost   Formatting of this note might be different from the original. Overview:  Qualifier: Diagnosis of  By: Jolene Provost   Cataract    Cervical cancer [facility]) 2001   Radical hysterectomy in [facility]   Chronic kidney disease, stage III (moderate) (HCC) 05/23/2014   Chronic pain syndrome 01/23/2016   Coronary artery disease    30% lesions noted   Depression    Diabetes type 2, uncontrolled 05/23/2014   Diabetic gastroparesis associated with type 1 diabetes mellitus (HCC) 11/27/2016   DIABETIC PERIPHERAL NEUROPATHY 09/21/2009   Qualifier: Diagnosis of  By: Delrae Alfred MD, [name]     Essential hypertension 08/30/2009   Qualifier: Diagnosis of  By: Cristela Felt, CNA, Christy     Gastroesophageal reflux disease 07/30/2009   Formatting of this note might be different from the original. Overview:  Overview:  Qualifier:  Diagnosis of  By: Delrae Alfred MD, Alfonso Patten: Diagnosis of  By: Wilmon Pali NP, Consuela Mimes of this note might be different from the original. Overview:  Qualifier: Diagnosis of  By: Delrae Alfred MD, Alfonso Patten: Diagnosis of  By: Wilmon Pali NP, Gunnar Fusi   GERD (gastroesophageal reflux disease)  History of cervical cancer 08/17/2015   Status post partial hysterectomy  Formatting of this note might be different from the original. Status post partial hysterectomy   Hyperlipidemia    INSOMNIA 09/21/2009   Qualifier: Diagnosis of  By: Delrae Alfred MD, [name]     Lumbar facet arthropathy 05/14/2016   Lumbar spinal stenosis 02/11/2018   Metatarsalgia of both feet 03/11/2017   Migraine 09/13/2012   Overview:  IMPRESSION: possible abd migraine   TOBACCO ABUSE 09/21/2009   Qualifier: Diagnosis of  By: Delrae Alfred MD, Beverely Low of this note might be different from the original. Overview:  Qualifier: Diagnosis of  By: Delrae Alfred MD, [name]   Trigeminal neuralgia    ULCER-GASTRIC 09/28/2009   Qualifier: Diagnosis of  By: Wilmon Pali NP, Leeann Must, CANDIDAL 12/25/2009   Qualifier: Diagnosis of  By: Delrae Alfred MD, Hubbard Robinson D deficiency 04/25/2013     Medicines Meds ordered this encounter  Medications   ondansetron (ZOFRAN) injection 4 mg   fentaNYL (SUBLIMAZE) injection 100 mcg    I have reviewed the patients home medicines and have made adjustments as needed  Problem List / ED Course: Problem List Items Addressed This Visit   None               Final Clinical Impression(s) / ED Diagnoses Final diagnoses:  None    Rx / DC Orders ED Discharge Orders     None         Shon Baton, MD 01/29/23 [id number]

## 2023-01-29 NOTE — ED Triage Notes (Signed)
Pt coming in with complaint of SOB starting 2 days ago along with sharp pain in left side of ribs. Mild bruising noted in triage on left ribcage. Pt denies any injuries or strenuous exertion recently. Pt recently seen in ED for dental pain. Patient denies chest pain, N/V/D. Mild weakness/fatigue reported. Pt's breathing labored and short. Pt states pain is worse when she breaths in.

## 2023-01-29 NOTE — ED Notes (Signed)
Dc instructions reviewed with patient. Patient voiced understanding. Dc with belongings.  °

## 2023-01-29 NOTE — ED Notes (Signed)
Patient oxygen saturation on room air while at rest: 99%, HR 95, RR 16 Patient oxygen saturation on room air while ambulating : 98%, HE 106, RR 19  Patient stated she felt a little dizzy but managed to tolerate well

## 2023-01-29 NOTE — ED Provider Notes (Signed)
Pt signed out by Dr. Wilkie Aye awaiting labs and xr.  Cbc nl, ua nl other than some protein, ddimer + at 1.3, covid/flu/rsv neg, cmp nl other than cr sl elevated at 1.27 (chronic)  CXR and CT chest reviewed by me.  I agree with the radiologist.  CXR:  No acute abnormality of the lungs.   CT chest:   Positive examination for pulmonary embolism with segmental to  subsegmental embolus in the left lower lobe. No other embolus  identified.  2. Scattered ground-glass airspace opacity in the inferior right  upper lobe, nonspecific and infectious or inflammatory.  3. Mild global cardiomegaly and coronary artery disease. No specific  imaging evidence of right heart strain, with RV LV ratio at 0.8 and  normal caliber of the main pulmonary artery.    Call report request was placed at the time of interpretation. Report  issued at this time in the interest of expediency. Final  communication of critical findings will be documented.    Aortic Atherosclerosis (ICD10-I70.0).    Pt is able to ambulate with good O2 sats on RA.  (98%).  She is referred to cards for her calcium in her coronary arteries.  She is at high risk for cad due to multiple risk factors.  Trop nl today.  Pt is stable for d/c.  Return if worse.  F/u with pcp.     Jacalyn Lefevre, MD 01/29/23 1004

## 2023-01-29 NOTE — Discharge Instructions (Addendum)
Information on my medicine - ELIQUIS (apixaban)  This medication education was reviewed with me or my healthcare representative as part of my discharge preparation.   Why was Eliquis prescribed for you? Eliquis was prescribed to treat blood clots that may have been found in the veins of your legs (deep vein thrombosis) or in your lungs (pulmonary embolism) and to reduce the risk of them occurring again.  What do You need to know about Eliquis ? The starting dose is 10 mg (two 5 mg tablets) taken TWICE daily for the FIRST SEVEN (7) DAYS, then the dose is reduced to ONE 5 mg tablet taken TWICE daily.  Eliquis may be taken with or without food.   Try to take the dose about the same time in the morning and in the evening. If you have difficulty swallowing the tablet whole please discuss with your pharmacist how to take the medication safely.  Take Eliquis exactly as prescribed and DO NOT stop taking Eliquis without talking to the doctor who prescribed the medication.  Stopping may increase your risk of developing a new blood clot.  Refill your prescription before you run out.  After discharge, you should have regular check-up appointments with your healthcare provider that is prescribing your Eliquis.    What do you do if you miss a dose? If a dose of ELIQUIS is not taken at the scheduled time, take it as soon as possible on the same day and twice-daily administration should be resumed. The dose should not be doubled to make up for a missed dose.  Important Safety Information A possible side effect of Eliquis is bleeding. You should call your healthcare provider right away if you experience any of the following: Bleeding from an injury or your nose that does not stop. Unusual colored urine (red or dark brown) or unusual colored stools (red or black). Unusual bruising for unknown reasons. A serious fall or if you hit your head (even if there is no bleeding).  Some medicines may  interact with Eliquis and might increase your risk of bleeding or clotting while on Eliquis. To help avoid this, consult your healthcare provider or pharmacist prior to using any new prescription or non-prescription medications, including herbals, vitamins, non-steroidal anti-inflammatory drugs (NSAIDs) and supplements.  This website has more information on Eliquis (apixaban): http://www.eliquis.com/eliquis/home  

## 2023-01-30 ENCOUNTER — Inpatient Hospital Stay (HOSPITAL_BASED_OUTPATIENT_CLINIC_OR_DEPARTMENT_OTHER)
Admission: EM | Admit: 2023-01-30 | Discharge: 2023-02-02 | DRG: 176 | Disposition: A | Discharge status: Home / Self Care | Payer: Commercial Managed Care - HMO | Attending: Family Medicine | Admitting: Family Medicine

## 2023-01-30 ENCOUNTER — Emergency Department (HOSPITAL_BASED_OUTPATIENT_CLINIC_OR_DEPARTMENT_OTHER): Payer: Commercial Managed Care - HMO

## 2023-01-30 ENCOUNTER — Other Ambulatory Visit: Payer: Self-pay

## 2023-01-30 ENCOUNTER — Encounter (HOSPITAL_BASED_OUTPATIENT_CLINIC_OR_DEPARTMENT_OTHER): Payer: Self-pay

## 2023-01-30 DIAGNOSIS — K529 Noninfective gastroenteritis and colitis, unspecified: Secondary | ICD-10-CM

## 2023-01-30 DIAGNOSIS — I2693 Single subsegmental pulmonary embolism without acute cor pulmonale: Principal | ICD-10-CM | POA: Diagnosis present

## 2023-01-30 DIAGNOSIS — Z7982 Long term (current) use of aspirin: Secondary | ICD-10-CM

## 2023-01-30 DIAGNOSIS — F419 Anxiety disorder, unspecified: Secondary | ICD-10-CM | POA: Diagnosis present

## 2023-01-30 DIAGNOSIS — Z20822 Contact with and (suspected) exposure to covid-19: Secondary | ICD-10-CM | POA: Diagnosis present

## 2023-01-30 DIAGNOSIS — Z8249 Family history of ischemic heart disease and other diseases of the circulatory system: Secondary | ICD-10-CM

## 2023-01-30 DIAGNOSIS — Z79899 Other long term (current) drug therapy: Secondary | ICD-10-CM

## 2023-01-30 DIAGNOSIS — Z8711 Personal history of peptic ulcer disease: Secondary | ICD-10-CM

## 2023-01-30 DIAGNOSIS — I2699 Other pulmonary embolism without acute cor pulmonale: Secondary | ICD-10-CM

## 2023-01-30 DIAGNOSIS — K219 Gastro-esophageal reflux disease without esophagitis: Secondary | ICD-10-CM | POA: Diagnosis present

## 2023-01-30 DIAGNOSIS — Z91040 Latex allergy status: Secondary | ICD-10-CM

## 2023-01-30 DIAGNOSIS — I160 Hypertensive urgency: Secondary | ICD-10-CM | POA: Diagnosis present

## 2023-01-30 DIAGNOSIS — I1310 Hypertensive heart and chronic kidney disease without heart failure, with stage 1 through stage 4 chronic kidney disease, or unspecified chronic kidney disease: Secondary | ICD-10-CM | POA: Diagnosis present

## 2023-01-30 DIAGNOSIS — Z89422 Acquired absence of other left toe(s): Secondary | ICD-10-CM

## 2023-01-30 DIAGNOSIS — R0902 Hypoxemia: Secondary | ICD-10-CM | POA: Diagnosis present

## 2023-01-30 DIAGNOSIS — I700 Atherosclerosis of aorta: Secondary | ICD-10-CM | POA: Diagnosis present

## 2023-01-30 DIAGNOSIS — I2510 Atherosclerotic heart disease of native coronary artery without angina pectoris: Secondary | ICD-10-CM | POA: Diagnosis present

## 2023-01-30 DIAGNOSIS — Z823 Family history of stroke: Secondary | ICD-10-CM

## 2023-01-30 DIAGNOSIS — E1022 Type 1 diabetes mellitus with diabetic chronic kidney disease: Secondary | ICD-10-CM | POA: Diagnosis present

## 2023-01-30 DIAGNOSIS — Z86711 Personal history of pulmonary embolism: Secondary | ICD-10-CM

## 2023-01-30 DIAGNOSIS — Z7985 Long-term (current) use of injectable non-insulin antidiabetic drugs: Secondary | ICD-10-CM

## 2023-01-30 DIAGNOSIS — Z90711 Acquired absence of uterus with remaining cervical stump: Secondary | ICD-10-CM

## 2023-01-30 DIAGNOSIS — Z7901 Long term (current) use of anticoagulants: Secondary | ICD-10-CM

## 2023-01-30 DIAGNOSIS — E1043 Type 1 diabetes mellitus with diabetic autonomic (poly)neuropathy: Secondary | ICD-10-CM | POA: Diagnosis present

## 2023-01-30 DIAGNOSIS — G894 Chronic pain syndrome: Secondary | ICD-10-CM | POA: Diagnosis present

## 2023-01-30 DIAGNOSIS — N183 Chronic kidney disease, stage 3 unspecified: Secondary | ICD-10-CM | POA: Diagnosis present

## 2023-01-30 DIAGNOSIS — Z794 Long term (current) use of insulin: Secondary | ICD-10-CM

## 2023-01-30 DIAGNOSIS — Z9049 Acquired absence of other specified parts of digestive tract: Secondary | ICD-10-CM

## 2023-01-30 DIAGNOSIS — I517 Cardiomegaly: Secondary | ICD-10-CM | POA: Diagnosis present

## 2023-01-30 DIAGNOSIS — J9601 Acute respiratory failure with hypoxia: Secondary | ICD-10-CM | POA: Diagnosis present

## 2023-01-30 DIAGNOSIS — E119 Type 2 diabetes mellitus without complications: Secondary | ICD-10-CM

## 2023-01-30 DIAGNOSIS — K3184 Gastroparesis: Secondary | ICD-10-CM | POA: Diagnosis present

## 2023-01-30 DIAGNOSIS — Z8541 Personal history of malignant neoplasm of cervix uteri: Secondary | ICD-10-CM

## 2023-01-30 DIAGNOSIS — D631 Anemia in chronic kidney disease: Secondary | ICD-10-CM | POA: Diagnosis present

## 2023-01-30 DIAGNOSIS — Z803 Family history of malignant neoplasm of breast: Secondary | ICD-10-CM

## 2023-01-30 DIAGNOSIS — E785 Hyperlipidemia, unspecified: Secondary | ICD-10-CM | POA: Diagnosis present

## 2023-01-30 DIAGNOSIS — Z888 Allergy status to other drugs, medicaments and biological substances status: Secondary | ICD-10-CM

## 2023-01-30 DIAGNOSIS — J45909 Unspecified asthma, uncomplicated: Secondary | ICD-10-CM | POA: Diagnosis present

## 2023-01-30 DIAGNOSIS — D649 Anemia, unspecified: Secondary | ICD-10-CM | POA: Diagnosis present

## 2023-01-30 DIAGNOSIS — Z885 Allergy status to narcotic agent status: Secondary | ICD-10-CM

## 2023-01-30 DIAGNOSIS — Z833 Family history of diabetes mellitus: Secondary | ICD-10-CM

## 2023-01-30 DIAGNOSIS — M549 Dorsalgia, unspecified: Secondary | ICD-10-CM | POA: Diagnosis present

## 2023-01-30 DIAGNOSIS — I429 Cardiomyopathy, unspecified: Secondary | ICD-10-CM | POA: Diagnosis present

## 2023-01-30 DIAGNOSIS — Z7984 Long term (current) use of oral hypoglycemic drugs: Secondary | ICD-10-CM

## 2023-01-30 DIAGNOSIS — N1831 Chronic kidney disease, stage 3a: Secondary | ICD-10-CM | POA: Diagnosis present

## 2023-01-30 DIAGNOSIS — D75839 Thrombocytosis, unspecified: Secondary | ICD-10-CM | POA: Diagnosis present

## 2023-01-30 DIAGNOSIS — Z87891 Personal history of nicotine dependence: Secondary | ICD-10-CM

## 2023-01-30 DIAGNOSIS — E1040 Type 1 diabetes mellitus with diabetic neuropathy, unspecified: Secondary | ICD-10-CM | POA: Diagnosis present

## 2023-01-30 HISTORY — DX: Other pulmonary embolism without acute cor pulmonale: I26.99

## 2023-01-30 LAB — CBC WITH DIFFERENTIAL/PLATELET
Abs Immature Granulocytes: 0.01 10*3/uL (ref 0.00–0.07)
Basophils Absolute: 0 10*3/uL (ref 0.0–0.1)
Basophils Relative: 0 %
Eosinophils Absolute: 0.5 10*3/uL (ref 0.0–0.5)
Eosinophils Relative: 5 %
HCT: 37.6 % (ref 36.0–46.0)
Hemoglobin: 12.4 g/dL (ref 12.0–15.0)
Immature Granulocytes: 0 %
Lymphocytes Relative: 31 %
Lymphs Abs: 2.7 10*3/uL (ref 0.7–4.0)
MCH: 28.6 pg (ref 26.0–34.0)
MCHC: 33 g/dL (ref 30.0–36.0)
MCV: 86.6 fL (ref 80.0–100.0)
Monocytes Absolute: 0.5 10*3/uL (ref 0.1–1.0)
Monocytes Relative: 6 %
Neutro Abs: 4.8 10*3/uL (ref 1.7–7.7)
Neutrophils Relative %: 58 %
Platelets: 565 10*3/uL — ABNORMAL HIGH (ref 150–400)
RBC: 4.34 MIL/uL (ref 3.87–5.11)
RDW: 14.2 % (ref 11.5–15.5)
WBC: 8.5 10*3/uL (ref 4.0–10.5)
nRBC: 0 % (ref 0.0–0.2)

## 2023-01-30 LAB — BASIC METABOLIC PANEL
Anion gap: 10 (ref 5–15)
BUN: 9 mg/dL (ref 6–20)
CO2: 28 mmol/L (ref 22–32)
Calcium: 9.7 mg/dL (ref 8.9–10.3)
Chloride: 98 mmol/L (ref 98–111)
Creatinine, Ser: 1.01 mg/dL — ABNORMAL HIGH (ref 0.44–1.00)
GFR, Estimated: >60 mL/min (ref >60)
Glucose, Bld: 135 mg/dL — ABNORMAL HIGH (ref 70–99)
Potassium: 4.2 mmol/L (ref 3.5–5.1)
Sodium: 136 mmol/L (ref 135–145)

## 2023-01-30 LAB — TROPONIN I (HIGH SENSITIVITY): Troponin I (High Sensitivity): 4 ng/L (ref <18)

## 2023-01-30 LAB — APTT: aPTT: 39 s — ABNORMAL HIGH (ref 24–36)

## 2023-01-30 MED ORDER — HEPARIN BOLUS VIA INFUSION
2500.0000 [IU] | Freq: Once | INTRAVENOUS | Status: AC
  Administered 2023-01-30: 2500 [IU] via INTRAVENOUS

## 2023-01-30 MED ORDER — HEPARIN (PORCINE) 25000 UT/250ML-% IV SOLN
1450.0000 [IU]/h | INTRAVENOUS | Status: DC
  Administered 2023-01-30: 1400 [IU]/h via INTRAVENOUS
  Administered 2023-01-31: 1450 [IU]/h via INTRAVENOUS
  Filled 2023-01-30 (×2): qty 250

## 2023-01-30 MED ORDER — ONDANSETRON HCL 4 MG/2ML IJ SOLN
4.0000 mg | Freq: Once | INTRAMUSCULAR | Status: AC
  Administered 2023-01-30: 4 mg via INTRAVENOUS
  Filled 2023-01-30: qty 2

## 2023-01-30 MED ORDER — HYDROMORPHONE HCL 1 MG/ML IJ SOLN
0.5000 mg | Freq: Once | INTRAMUSCULAR | Status: AC
  Administered 2023-01-30: 0.5 mg via INTRAVENOUS
  Filled 2023-01-30: qty 1

## 2023-01-30 MED ORDER — POLYETHYLENE GLYCOL 3350 17 G PO PACK: 17.0000 g | PACK | Freq: Every day | ORAL | Status: DC | PRN

## 2023-01-30 MED ORDER — HYDROMORPHONE HCL 1 MG/ML IJ SOLN
0.5000 mg | INTRAMUSCULAR | Status: DC | PRN
  Administered 2023-01-31 – 2023-02-02 (×11): 0.5 mg via INTRAVENOUS
  Filled 2023-01-30 (×4): qty 0.5
  Filled 2023-01-30: qty 1
  Filled 2023-01-30 (×2): qty 0.5
  Filled 2023-01-30: qty 1
  Filled 2023-01-30 (×2): qty 0.5
  Filled 2023-01-30: qty 1

## 2023-01-30 NOTE — ED Provider Notes (Signed)
[place] EMERGENCY DEPARTMENT AT [facility] Provider Note   CSN: [id number] Arrival date & time: 01/30/23  2018     History  Chief Complaint  Patient presents with   pulmonary embolism   Shortness of Breath    [name] is a 57 y.o. female.  Patient present with family today for evaluation of shortness of breath and left-sided chest pain with radiation to the abdomen.  Patient was seen in the emergency department yesterday morning for the similar symptoms.  She was evaluated and found to have left lower lobe PE without heart strain.  She was treated and released as she was felt to be low risk.  She states that she continues to feel short of breath and is not doing well at home.  She has been compliant with apixaban and last took this this morning.       Home Medications Prior to Admission medications   Medication Sig Start Date End Date Taking? Authorizing Provider  albuterol (VENTOLIN HFA) 108 (90 Base) MCG/ACT inhaler Inhale 2 puffs into the lungs every 6 (six) hours as needed for wheezing or shortness of breath. Patient taking differently: Inhale 2 puffs into the lungs 3 (three) times daily as needed for wheezing or shortness of breath. 08/03/20   Saguier, Ramon Dredge, PA-C  amLODipine (NORVASC) 5 MG tablet Take 1 tablet (5 mg total) by mouth daily. 11/17/22 11/12/23  Saguier, Ramon Dredge, PA-C  apixaban (ELIQUIS) 5 MG TABS tablet Take 2 tablets (10mg ) twice daily for 7 days, then 1 tablet (5mg ) twice daily 01/29/23   Jacalyn Lefevre, MD  aspirin 81 MG tablet Take 81 mg by mouth daily.    [provider]  atorvastatin (LIPITOR) 80 MG tablet Take 1 tablet (80 mg total) by mouth daily. 01/20/23   Saguier, Ramon Dredge, PA-C  baclofen (LIORESAL) 10 MG tablet Take 1 tablet (10 mg total) by mouth 3 (three) times daily. 11/17/22   Saguier, Ramon Dredge, PA-C  busPIRone (BUSPAR) 15 MG tablet Take 1 tablet (15 mg total) by mouth 2 (two) times daily. Patient taking differently: Take  15 mg by mouth daily as needed (anxiety). 01/13/22   Saguier, Ramon Dredge, PA-C  carvedilol (COREG) 25 MG tablet Take 1 tablet (25 mg total) by mouth 2 (two) times daily with a meal. 11/17/22   Saguier, Ramon Dredge, PA-C  Continuous Glucose Sensor (FREESTYLE LIBRE 3 SENSOR) MISC USE AS DIRECTED TO MONITOR BLOOD SUGAR. CHANGE EVERY 14 DAYS 08/07/22   [provider]  doxycycline (VIBRAMYCIN) 100 MG capsule Take 1 capsule (100 mg total) by mouth 2 (two) times daily. 01/29/23   Jacalyn Lefevre, MD  fluticasone (FLONASE) 50 MCG/ACT nasal spray Place 2 sprays into both nostrils daily. 01/13/22   Saguier, Ramon Dredge, PA-C  fluticasone furoate-vilanterol (BREO ELLIPTA) 100-25 MCG/INH AEPB Inhale 1 puff into the lungs daily. 08/03/20   Saguier, Ramon Dredge, PA-C  gabapentin (NEURONTIN) 300 MG capsule TAKE 2 CAPSULES BY MOUTH THREE TIMES DAILY 08/12/22   Saguier, Ramon Dredge, PA-C  Insulin Glargine [facility]) 100 UNIT/ML INJECT 40 UNITS SUBCUTANEOUSLY AT BEDTIME 08/12/22   Saguier, Ramon Dredge, PA-C  Insulin Pen Needle (PEN NEEDLES 31GX5/16") 31G X 8 MM MISC Use daily as directed 03/28/22   Saguier, Ramon Dredge, PA-C  lidocaine (LIDODERM) 5 % Place 1 patch onto the skin daily. Remove & Discard patch within 12 hours or as directed by MD 11/08/22   Dorcas Carrow, MD  metFORMIN (GLUCOPHAGE-XR) 500 MG 24 hr tablet Take 1 tablet (500 mg total) by mouth daily with breakfast.  11/17/22   Saguier, Ramon Dredge, PA-C  metoCLOPramide (REGLAN) 5 MG tablet Take 1 tablet (5 mg total) by mouth 3 (three) times daily before meals. 11/08/22 12/08/22  Dorcas Carrow, MD  nitroGLYCERIN (NITROSTAT) 0.4 MG SL tablet Place 1 tablet (0.4 mg total) under the tongue every 5 (five) minutes as needed for chest pain. 03/21/21   Saguier, Ramon Dredge, PA-C  ondansetron (ZOFRAN) 4 MG tablet Take 4 mg by mouth every 8 (eight) hours as needed for nausea or vomiting. Patient not taking: Reported on 12/31/2022    [provider]  oxyCODONE (OXY IR/ROXICODONE) 5 MG immediate  release tablet Take 1 tablet (5 mg total) by mouth every 4 (four) hours as needed for severe pain (pain score 7-10). 01/29/23   Jacalyn Lefevre, MD  Oxycodone HCl 10 MG TABS 1 tab po bid prn severe pain. 11/12/22   Saguier, Ramon Dredge, PA-C  OZEMPIC, 0.25 OR 0.5 MG/DOSE, 2 MG/3ML SOPN Inject 0.5 mg into the skin once a week. mondays 10/14/22   [provider]  pantoprazole (PROTONIX) 40 MG tablet Take 1 tablet (40 mg total) by mouth 2 (two) times daily. 11/08/22 12/08/22  Dorcas Carrow, MD  promethazine (PHENERGAN) 12.5 MG tablet Take 1 tablet (12.5 mg total) by mouth every 8 (eight) hours as needed for nausea or vomiting. 01/08/23   Saguier, Ramon Dredge, PA-C  sucralfate (CARAFATE) 1 g tablet Take 1 tablet (1 g total) by mouth 4 (four) times daily. 11/08/22 11/08/23  Dorcas Carrow, MD  topiramate (TOPAMAX) 50 MG tablet Take 1 tablet (50 mg total) by mouth 2 (two) times daily. Patient taking differently: Take 50 mg by mouth 2 (two) times daily as needed (headache). 01/13/22   Saguier, Ramon Dredge, PA-C  VITAMIN D PO Take 5,000 Units by mouth daily.    [provider]      Allergies    Lisinopril, Oxycodone-acetaminophen, Latex, and Morphine    Review of Systems   Review of Systems  Physical Exam Updated Vital Signs BP 131/88   Pulse 99   Temp 98 F (36.7 C)   Resp 15   Ht 6' (1.829 m)   Wt 81.6 kg   SpO2 100%   BMI 24.40 kg/m   Physical Exam Vitals and nursing note reviewed.  Constitutional:      General: She is in acute distress (Appears uncomfortable).     Appearance: She is well-developed.  HENT:     Head: Normocephalic and atraumatic.     Right Ear: External ear normal.     Left Ear: External ear normal.     Nose: Nose normal.  Eyes:     Conjunctiva/sclera: Conjunctivae normal.  Cardiovascular:     Rate and Rhythm: Regular rhythm. Tachycardia present.     Heart sounds: No murmur heard. Pulmonary:     Effort: No respiratory distress.     Breath sounds: No wheezing,  rhonchi or rales.  Abdominal:     Palpations: Abdomen is soft.     Tenderness: There is no abdominal tenderness. There is no guarding or rebound.  Musculoskeletal:     Cervical back: Normal range of motion and neck supple.     Right lower leg: No edema.     Left lower leg: No edema.  Skin:    General: Skin is warm and dry.     Findings: No rash.  Neurological:     General: No focal deficit present.     Mental Status: She is alert. Mental status is at baseline.  Motor: No weakness.  Psychiatric:        Mood and Affect: Mood normal.     ED Results / Procedures / Treatments   Labs (all labs ordered are listed, but only abnormal results are displayed) Labs Reviewed  CBC WITH DIFFERENTIAL/PLATELET - Abnormal; Notable for the following components:      Result Value   Platelets 565 (*)    All other components within normal limits  BASIC METABOLIC PANEL - Abnormal; Notable for the following components:   Glucose, Bld 135 (*)    Creatinine, Ser 1.01 (*)    All other components within normal limits  APTT - Abnormal; Notable for the following components:   aPTT 39 (*)    All other components within normal limits  HEPARIN LEVEL (UNFRACTIONATED)  CBC  APTT  TROPONIN I (HIGH SENSITIVITY)    EKG EKG Interpretation Date/Time:  Friday January 30 2023 20:33:01 EST Ventricular Rate:  112 PR Interval:    QRS Duration:  74 QT Interval:  336 QTC Calculation: 459 R Axis:   51  Text Interpretation: probable sinus tachycardia Abnormal R-wave progression, early transition Confirmed by Rolan Bucco [phone]) on 01/30/2023 9:16:50 PM  Radiology DG Chest Portable 1 View Result Date: 01/30/2023 CLINICAL DATA:  Shortness of breath EXAM: PORTABLE CHEST 1 VIEW COMPARISON:  01/29/2023 FINDINGS: Heart and mediastinal contours are within normal limits. No focal opacities or effusions. No acute bony abnormality. IMPRESSION: No active cardiopulmonary disease. Electronically Signed   By: Charlett Nose M.D.   On: 01/30/2023 22:30   CT Angio Chest PE W and/or Wo Contrast Result Date: 01/29/2023 CLINICAL DATA:  PE suspected, shortness of breath for 2 days, left chest pain EXAM: CT ANGIOGRAPHY CHEST WITH CONTRAST TECHNIQUE: Multidetector CT imaging of the chest was performed using the standard protocol during bolus administration of intravenous contrast. Multiplanar CT image reconstructions and MIPs were obtained to evaluate the vascular anatomy. RADIATION DOSE REDUCTION: This exam was performed according to the departmental dose-optimization program which includes automated exposure control, adjustment of the mA and/or kV according to patient size and/or use of iterative reconstruction technique. CONTRAST:  75mL OMNIPAQUE IOHEXOL 350 MG/ML SOLN COMPARISON:  07/28/2022 FINDINGS: Cardiovascular: Satisfactory opacification of the pulmonary arteries to the segmental level. Positive examination for pulmonary embolism with segmental to subsegmental embolus in the left lower lobe (series 5, image 150). No other embolus identified. Mild global cardiomegaly. RV LV ratio is preserved at 0.8. Coronary artery calcifications. No pericardial effusion. Aortic atherosclerosis. Mediastinum/Nodes: No enlarged mediastinal, hilar, or axillary lymph nodes. Thyroid gland, trachea, and esophagus demonstrate no significant findings. Lungs/Pleura: Bibasilar partial atelectasis and mosaic attenuation of the airspaces (series 6, image 88). Scattered ground-glass airspace opacity in the inferior right upper lobe (series 6, image 59). No pleural effusion or pneumothorax. Upper Abdomen: No acute abnormality. Musculoskeletal: No chest wall abnormality. No acute osseous findings. Review of the MIP images confirms the above findings. IMPRESSION: 1. Positive examination for pulmonary embolism with segmental to subsegmental embolus in the left lower lobe. No other embolus identified. 2. Scattered ground-glass airspace opacity in the  inferior right upper lobe, nonspecific and infectious or inflammatory. 3. Mild global cardiomegaly and coronary artery disease. No specific imaging evidence of right heart strain, with RV LV ratio at 0.8 and normal caliber of the main pulmonary artery. Call report request was placed at the time of interpretation. Report issued at this time in the interest of expediency. Final communication of critical findings will be documented. Aortic Atherosclerosis (  ICD10-I70.0). Electronically Signed   By: Jearld Lesch M.D.   On: 01/29/2023 09:22   DG Chest 2 View Result Date: 01/29/2023 CLINICAL DATA:  Lower chest wall pain, shortness of breath EXAM: CHEST - 2 VIEW COMPARISON:  11/12/2022 FINDINGS: The heart size and mediastinal contours are within normal limits. Both lungs are clear. The visualized skeletal structures are unremarkable. IMPRESSION: No acute abnormality of the lungs. Electronically Signed   By: Jearld Lesch M.D.   On: 01/29/2023 09:10    Procedures Procedures    Medications Ordered in ED Medications  heparin ADULT infusion 100 units/mL (25000 units/280mL) (1,400 Units/hr Intravenous New Bag/Given 01/30/23 2308)  HYDROmorphone (DILAUDID) injection 0.5 mg (0.5 mg Intravenous Given 01/30/23 2132)  ondansetron (ZOFRAN) injection 4 mg (4 mg Intravenous Given 01/30/23 2132)  heparin bolus via infusion 2,500 Units (2,500 Units Intravenous Bolus from Bag 01/30/23 2309)    ED Course/ Medical Decision Making/ A&P    Patient seen and examined. History obtained directly from patient. Work-up including labs, imaging, EKG ordered in triage, if performed, were reviewed.  Reviewed imaging and ED notes from previous visit.  Labs/EKG: Independently reviewed and interpreted.  This included: CBC unremarkable; BMP unremarkable; troponin stable at 4.  Sinus tachycardia noted on EKG, reviewed and interpreted as above.  Imaging: Portable chest x-ray ordered  Medications/Fluids: Ordered: IV heparin, IV  Dilaudid and Zofran for pain and nausea control.  Most recent vital signs reviewed and are as follows: BP 131/88   Pulse 99   Temp 98 F (36.7 C)   Resp 15   Ht 6' (1.829 m)   Wt 81.6 kg   SpO2 100%   BMI 24.40 kg/m   Initial impression: Patient with known left lower lobe PE presents with shortness of breath which has been ongoing.  At rest on arrival, oxygen saturation in the mid 80s.  She does have pain in her left chest likely related to the PE.  She is also short of breath.  Given documented hypoxia, plan for admission to hospital.  Will start on IV heparin.    Reassessment performed. Patient appears stable.  Family at bedside, supportive.  Imaging personally visualized and interpreted including: Portable chest x-ray, agree negative.  Reviewed pertinent lab work and imaging with patient at bedside. Questions answered.   Most current vital signs reviewed and are as follows: BP 131/88   Pulse 99   Temp 98 F (36.7 C)   Resp 15   Ht 6' (1.829 m)   Wt 81.6 kg   SpO2 100%   BMI 24.40 kg/m   Plan: Admit to hospital.   CRITICAL CARE Performed by: Renne Crigler PA-C Total critical care time: 35 minutes Critical care time was exclusive of separately billable procedures and treating other patients. Critical care was necessary to treat or prevent imminent or life-threatening deterioration. Critical care was time spent personally by me on the following activities: development of treatment plan with patient and/or surrogate as well as nursing, discussions with consultants, evaluation of patient's response to treatment, examination of patient, obtaining history from patient or surrogate, ordering and performing treatments and interventions, ordering and review of laboratory studies, ordering and review of radiographic studies, pulse oximetry and re-evaluation of patient's condition.                                  Medical Decision Making Amount and/or Complexity of Data  Reviewed Labs: ordered.  Radiology: ordered.  Risk Prescription drug management. Decision regarding hospitalization.   Admit for shortness of breath and hypoxia on room air in setting of known PE.  Troponins have remained normal.        Final Clinical Impression(s) / ED Diagnoses Final diagnoses:  Single subsegmental pulmonary embolism without acute cor pulmonale (HCC)  Acute respiratory failure with hypoxia [facility])    Rx / DC Orders ED Discharge Orders     None         Renne Crigler, PA-C 01/30/23 2322    Rolan Bucco, MD 01/30/23 2342

## 2023-01-30 NOTE — ED Notes (Signed)
Pt's O2 ranging 80-85% on RA, pt placed on 2lpm [place] and instructed to lay on her back. Pt's O2 increased to 100%.

## 2023-01-30 NOTE — ED Triage Notes (Signed)
Pt w continued SHOB, back pain. Seen yesterday & dx w PE. Pt advises no relief in [facility], increased back pain. Pt tearful in triage

## 2023-01-30 NOTE — Progress Notes (Addendum)
PHARMACY - ANTICOAGULATION CONSULT NOTE  Pharmacy Consult for heparin infusion Indication: pulmonary embolus  Allergies  Allergen Reactions   Lisinopril Swelling    Angioedema   Oxycodone-Acetaminophen Nausea Only   Latex Itching, Swelling and Rash   Morphine Itching and Rash    Patient Measurements: Height: 6' (182.9 cm) Weight: 81.6 kg (179 lb 14.3 oz) IBW/kg (Calculated) : 73.1 Heparin Dosing Weight: 81.6 kg  Vital Signs: Temp: 98 F (36.7 C) (12/20 2045) BP: 137/88 (12/20 2100) Pulse Rate: 92 (12/20 2120)  Labs: Recent Labs    01/29/23 0637 01/29/23 0747 01/30/23 2055  HGB 11.6*  --  12.4  HCT 36.5  --  37.6  PLT 477*  --  565*  CREATININE  --  1.27* 1.01*  TROPONINIHS 3 3 4     Estimated Creatinine Clearance: 70.1 mL/min (A) (by C-G formula based on SCr of 1.01 mg/dL (H)).   Medical History: Past Medical History:  Diagnosis Date   Abnormal Pap smear of cervix    Anxiety 01/22/2016   Asthma    Cardiac murmur 02/01/2019   Cardiomyopathy, secondary (HCC) 09/06/2009   Qualifier: Diagnosis of  By: Jolene Provost   Formatting of this note might be different from the original. Overview:  Qualifier: Diagnosis of  By: Jolene Provost   Cataract    Cervical cancer [facility]) 2001   Radical hysterectomy in [facility]   Chronic kidney disease, stage III (moderate) (HCC) 05/23/2014   Chronic pain syndrome 01/23/2016   Coronary artery disease    30% lesions noted   Depression    Diabetes type 2, uncontrolled 05/23/2014   Diabetic gastroparesis associated with type 1 diabetes mellitus (HCC) 11/27/2016   DIABETIC PERIPHERAL NEUROPATHY 09/21/2009   Qualifier: Diagnosis of  By: Delrae Alfred MD, [name]     Essential hypertension 08/30/2009   Qualifier: Diagnosis of  By: Cristela Felt, CNA, Christy     Gastroesophageal reflux disease 07/30/2009   Formatting of this note might be different from the original. Overview:  Overview:  Qualifier: Diagnosis  of  By: Delrae Alfred MD, Alfonso Patten: Diagnosis of  By: Wilmon Pali NP, Consuela Mimes of this note might be different from the original. Overview:  Qualifier: Diagnosis of  By: Delrae Alfred MD, Alfonso Patten: Diagnosis of  By: Wilmon Pali NP, Gunnar Fusi   GERD (gastroesophageal reflux disease)    History of cervical cancer 08/17/2015   Status post partial hysterectomy  Formatting of this note might be different from the original. Status post partial hysterectomy   Hyperlipidemia    INSOMNIA 09/21/2009   Qualifier: Diagnosis of  By: Delrae Alfred MD, [name]     Lumbar facet arthropathy 05/14/2016   Lumbar spinal stenosis 02/11/2018   Metatarsalgia of both feet 03/11/2017   Migraine 09/13/2012   Overview:  IMPRESSION: possible abd migraine   TOBACCO ABUSE 09/21/2009   Qualifier: Diagnosis of  By: Delrae Alfred MD, Beverely Low of this note might be different from the original. Overview:  Qualifier: Diagnosis of  By: Delrae Alfred MD, [name]   Trigeminal neuralgia    ULCER-GASTRIC 09/28/2009   Qualifier: Diagnosis of  By: Wilmon Pali NP, Leeann Must, CANDIDAL 12/25/2009   Qualifier: Diagnosis of  By: Delrae Alfred MD, Hubbard Robinson D deficiency 04/25/2013    Medications:  (Not in a hospital admission)   Assessment: 57 yo F presents to ED with SOB and increased back pain, despite initiation of apixaban 10mg  BID for acute PE  on 12/19 AM. Patient requiring oxygen. Patient was not taking apixaban prior to 01/29/23. Pharmacy consulted to dose heparin infusion for acute PE.   Last dose of apixaban 10mg  was 0800 on 12/20.   Hgb 12.4, Plt 565  No s/sx of bleeding   Goal of Therapy:  Heparin level 0.3-0.7 units/ml aPTT 66-102 seconds Monitor platelets by anticoagulation protocol: Yes   Plan:  Baseline aPTT and heparin level - unable to obtain due to need for courier to get labs at [facility] Give heparin 2500 units IV bolus from infusion x1, then Initiate heparin infusion at  1400 units/hr Check aPTT and heparin level in 6 hours Monitor daily CBC, heparin level, and for s/sx of bleeding   Wilburn Cornelia, PharmD, BCPS Clinical Pharmacist 01/30/2023 9:53 PM   Please refer to [facility] for pharmacy phone number

## 2023-01-31 ENCOUNTER — Inpatient Hospital Stay (HOSPITAL_COMMUNITY): Payer: Commercial Managed Care - HMO

## 2023-01-31 ENCOUNTER — Other Ambulatory Visit (HOSPITAL_COMMUNITY): Payer: Commercial Managed Care - HMO

## 2023-01-31 DIAGNOSIS — N1831 Chronic kidney disease, stage 3a: Secondary | ICD-10-CM

## 2023-01-31 DIAGNOSIS — I517 Cardiomegaly: Secondary | ICD-10-CM | POA: Diagnosis present

## 2023-01-31 DIAGNOSIS — D649 Anemia, unspecified: Secondary | ICD-10-CM | POA: Diagnosis present

## 2023-01-31 DIAGNOSIS — D631 Anemia in chronic kidney disease: Secondary | ICD-10-CM | POA: Diagnosis present

## 2023-01-31 DIAGNOSIS — G894 Chronic pain syndrome: Secondary | ICD-10-CM | POA: Diagnosis present

## 2023-01-31 DIAGNOSIS — Z20822 Contact with and (suspected) exposure to covid-19: Secondary | ICD-10-CM | POA: Diagnosis present

## 2023-01-31 DIAGNOSIS — I2510 Atherosclerotic heart disease of native coronary artery without angina pectoris: Secondary | ICD-10-CM | POA: Diagnosis present

## 2023-01-31 DIAGNOSIS — I2699 Other pulmonary embolism without acute cor pulmonale: Secondary | ICD-10-CM | POA: Diagnosis not present

## 2023-01-31 DIAGNOSIS — K219 Gastro-esophageal reflux disease without esophagitis: Secondary | ICD-10-CM | POA: Diagnosis present

## 2023-01-31 DIAGNOSIS — K3184 Gastroparesis: Secondary | ICD-10-CM | POA: Diagnosis present

## 2023-01-31 DIAGNOSIS — E785 Hyperlipidemia, unspecified: Secondary | ICD-10-CM | POA: Diagnosis present

## 2023-01-31 DIAGNOSIS — I2602 Saddle embolus of pulmonary artery with acute cor pulmonale: Secondary | ICD-10-CM | POA: Diagnosis not present

## 2023-01-31 DIAGNOSIS — J9601 Acute respiratory failure with hypoxia: Secondary | ICD-10-CM

## 2023-01-31 DIAGNOSIS — Z86711 Personal history of pulmonary embolism: Secondary | ICD-10-CM

## 2023-01-31 DIAGNOSIS — D75839 Thrombocytosis, unspecified: Secondary | ICD-10-CM | POA: Diagnosis present

## 2023-01-31 DIAGNOSIS — F419 Anxiety disorder, unspecified: Secondary | ICD-10-CM | POA: Diagnosis present

## 2023-01-31 DIAGNOSIS — E782 Mixed hyperlipidemia: Secondary | ICD-10-CM

## 2023-01-31 DIAGNOSIS — Z79899 Other long term (current) drug therapy: Secondary | ICD-10-CM | POA: Diagnosis not present

## 2023-01-31 DIAGNOSIS — M549 Dorsalgia, unspecified: Secondary | ICD-10-CM | POA: Diagnosis present

## 2023-01-31 DIAGNOSIS — I429 Cardiomyopathy, unspecified: Secondary | ICD-10-CM | POA: Diagnosis present

## 2023-01-31 DIAGNOSIS — I160 Hypertensive urgency: Secondary | ICD-10-CM

## 2023-01-31 DIAGNOSIS — Z794 Long term (current) use of insulin: Secondary | ICD-10-CM

## 2023-01-31 DIAGNOSIS — E1043 Type 1 diabetes mellitus with diabetic autonomic (poly)neuropathy: Secondary | ICD-10-CM | POA: Diagnosis present

## 2023-01-31 DIAGNOSIS — I2693 Single subsegmental pulmonary embolism without acute cor pulmonale: Secondary | ICD-10-CM | POA: Diagnosis present

## 2023-01-31 DIAGNOSIS — E1169 Type 2 diabetes mellitus with other specified complication: Secondary | ICD-10-CM

## 2023-01-31 DIAGNOSIS — R0902 Hypoxemia: Secondary | ICD-10-CM | POA: Diagnosis present

## 2023-01-31 DIAGNOSIS — R0602 Shortness of breath: Secondary | ICD-10-CM | POA: Diagnosis present

## 2023-01-31 DIAGNOSIS — I700 Atherosclerosis of aorta: Secondary | ICD-10-CM | POA: Diagnosis present

## 2023-01-31 DIAGNOSIS — E1040 Type 1 diabetes mellitus with diabetic neuropathy, unspecified: Secondary | ICD-10-CM | POA: Diagnosis present

## 2023-01-31 DIAGNOSIS — J45909 Unspecified asthma, uncomplicated: Secondary | ICD-10-CM | POA: Diagnosis present

## 2023-01-31 DIAGNOSIS — E1022 Type 1 diabetes mellitus with diabetic chronic kidney disease: Secondary | ICD-10-CM | POA: Diagnosis present

## 2023-01-31 DIAGNOSIS — I1310 Hypertensive heart and chronic kidney disease without heart failure, with stage 1 through stage 4 chronic kidney disease, or unspecified chronic kidney disease: Secondary | ICD-10-CM | POA: Diagnosis present

## 2023-01-31 HISTORY — DX: Unspecified asthma, uncomplicated: J45.909

## 2023-01-31 HISTORY — DX: Cardiomegaly: I51.7

## 2023-01-31 HISTORY — DX: Thrombocytosis, unspecified: D75.839

## 2023-01-31 HISTORY — DX: Hypomagnesemia: E83.42

## 2023-01-31 HISTORY — DX: Anemia, unspecified: D64.9

## 2023-01-31 HISTORY — DX: Acute respiratory failure with hypoxia: J96.01

## 2023-01-31 LAB — CBC
HCT: 33.9 % — ABNORMAL LOW (ref 36.0–46.0)
Hemoglobin: 11.2 g/dL — ABNORMAL LOW (ref 12.0–15.0)
MCH: 28.6 pg (ref 26.0–34.0)
MCHC: 33 g/dL (ref 30.0–36.0)
MCV: 86.7 fL (ref 80.0–100.0)
Platelets: 513 10*3/uL — ABNORMAL HIGH (ref 150–400)
RBC: 3.91 MIL/uL (ref 3.87–5.11)
RDW: 14.3 % (ref 11.5–15.5)
WBC: 7.9 10*3/uL (ref 4.0–10.5)
nRBC: 0 % (ref 0.0–0.2)

## 2023-01-31 LAB — GLUCOSE, CAPILLARY
Glucose-Capillary: 124 mg/dL — ABNORMAL HIGH (ref 70–99)
Glucose-Capillary: 132 mg/dL — ABNORMAL HIGH (ref 70–99)
Glucose-Capillary: 140 mg/dL — ABNORMAL HIGH (ref 70–99)

## 2023-01-31 LAB — APTT
aPTT: 62 s — ABNORMAL HIGH (ref 24–36)
aPTT: 128 s — ABNORMAL HIGH (ref 24–36)
aPTT: 136 s — ABNORMAL HIGH (ref 24–36)

## 2023-01-31 LAB — HEPARIN LEVEL (UNFRACTIONATED): Heparin Unfractionated: >1.1 [IU]/mL — ABNORMAL HIGH (ref 0.30–0.70)

## 2023-01-31 LAB — COMPREHENSIVE METABOLIC PANEL
ALT: 16 U/L (ref 0–44)
AST: 15 U/L (ref 15–41)
Albumin: 2.7 g/dL — ABNORMAL LOW (ref 3.5–5.0)
Alkaline Phosphatase: 91 U/L (ref 38–126)
Anion gap: 10 (ref 5–15)
BUN: 10 mg/dL (ref 6–20)
CO2: 28 mmol/L (ref 22–32)
Calcium: 8.9 mg/dL (ref 8.9–10.3)
Chloride: 101 mmol/L (ref 98–111)
Creatinine, Ser: 1.13 mg/dL — ABNORMAL HIGH (ref 0.44–1.00)
GFR, Estimated: 56 mL/min — ABNORMAL LOW (ref >60)
Glucose, Bld: 115 mg/dL — ABNORMAL HIGH (ref 70–99)
Potassium: 3.5 mmol/L (ref 3.5–5.1)
Sodium: 139 mmol/L (ref 135–145)
Total Bilirubin: 0.6 mg/dL (ref <1.2)
Total Protein: 7 g/dL (ref 6.5–8.1)

## 2023-01-31 LAB — PROCALCITONIN: Procalcitonin: <0.1 ng/mL

## 2023-01-31 LAB — MAGNESIUM: Magnesium: 1.5 mg/dL — ABNORMAL LOW (ref 1.7–2.4)

## 2023-01-31 MED ORDER — PANTOPRAZOLE SODIUM 40 MG PO TBEC
40.0000 mg | DELAYED_RELEASE_TABLET | Freq: Two times a day (BID) | ORAL | Status: DC
  Administered 2023-01-31 – 2023-02-02 (×5): 40 mg via ORAL
  Filled 2023-01-31 (×5): qty 1

## 2023-01-31 MED ORDER — OXYCODONE HCL 5 MG PO TABS
10.0000 mg | ORAL_TABLET | Freq: Three times a day (TID) | ORAL | Status: DC | PRN
  Administered 2023-01-31 – 2023-02-02 (×3): 10 mg via ORAL
  Filled 2023-01-31 (×4): qty 2

## 2023-01-31 MED ORDER — SODIUM CHLORIDE 0.9% FLUSH
3.0000 mL | Freq: Two times a day (BID) | INTRAVENOUS | Status: DC
  Administered 2023-01-31 – 2023-02-02 (×4): 3 mL via INTRAVENOUS

## 2023-01-31 MED ORDER — HYDROMORPHONE HCL 1 MG/ML IJ SOLN
0.5000 mg | Freq: Once | INTRAMUSCULAR | Status: DC
  Filled 2023-01-31: qty 0.5

## 2023-01-31 MED ORDER — HYDRALAZINE HCL 20 MG/ML IJ SOLN: 10.0000 mg | INTRAMUSCULAR | Status: DC | PRN

## 2023-01-31 MED ORDER — FLUTICASONE PROPIONATE 50 MCG/ACT NA SUSP: 2.0000 | Freq: Every day | NASAL | Status: DC

## 2023-01-31 MED ORDER — FLUTICASONE FUROATE-VILANTEROL 100-25 MCG/INH IN AEPB
1.0000 | INHALATION_SPRAY | Freq: Every day | RESPIRATORY_TRACT | Status: DC
Start: 2023-01-31 — End: 2023-02-02
  Administered 2023-02-01 – 2023-02-02 (×2): 1 via RESPIRATORY_TRACT
  Filled 2023-01-31 (×2): qty 1

## 2023-01-31 MED ORDER — MELATONIN 3 MG PO TABS
3.0000 mg | ORAL_TABLET | Freq: Every evening | ORAL | Status: DC | PRN
Start: 2023-01-31 — End: 2023-02-02

## 2023-01-31 MED ORDER — CARVEDILOL 25 MG PO TABS
25.0000 mg | ORAL_TABLET | Freq: Two times a day (BID) | ORAL | Status: DC
  Administered 2023-01-31 – 2023-02-02 (×4): 25 mg via ORAL
  Filled 2023-01-31 (×4): qty 1

## 2023-01-31 MED ORDER — MAGNESIUM SULFATE 2 GM/50ML IV SOLN
2.0000 g | Freq: Once | INTRAVENOUS | Status: AC
  Administered 2023-01-31: 2 g via INTRAVENOUS
  Filled 2023-01-31: qty 50

## 2023-01-31 MED ORDER — PROCHLORPERAZINE EDISYLATE 10 MG/2ML IJ SOLN
5.0000 mg | Freq: Four times a day (QID) | INTRAMUSCULAR | Status: DC | PRN
  Administered 2023-01-31 (×2): 5 mg via INTRAVENOUS
  Filled 2023-01-31 (×2): qty 2

## 2023-01-31 MED ORDER — ATORVASTATIN CALCIUM 80 MG PO TABS
80.0000 mg | ORAL_TABLET | Freq: Every day | ORAL | Status: DC
  Administered 2023-01-31 – 2023-02-02 (×3): 80 mg via ORAL
  Filled 2023-01-31 (×3): qty 1

## 2023-01-31 MED ORDER — ALBUTEROL SULFATE (2.5 MG/3ML) 0.083% IN NEBU: 2.5000 mg | INHALATION_SOLUTION | RESPIRATORY_TRACT | Status: DC | PRN

## 2023-01-31 MED ORDER — AMLODIPINE BESYLATE 5 MG PO TABS
5.0000 mg | ORAL_TABLET | Freq: Every day | ORAL | Status: DC
  Administered 2023-01-31 – 2023-02-02 (×3): 5 mg via ORAL
  Filled 2023-01-31 (×3): qty 1

## 2023-01-31 MED ORDER — BACLOFEN 10 MG PO TABS
10.0000 mg | ORAL_TABLET | Freq: Three times a day (TID) | ORAL | Status: DC | PRN
  Administered 2023-02-02: 10 mg via ORAL
  Filled 2023-01-31: qty 1

## 2023-01-31 MED ORDER — TOPIRAMATE 25 MG PO TABS
50.0000 mg | ORAL_TABLET | Freq: Two times a day (BID) | ORAL | Status: DC | PRN
Start: 2023-01-31 — End: 2023-02-02

## 2023-01-31 MED ORDER — GABAPENTIN 300 MG PO CAPS
600.0000 mg | ORAL_CAPSULE | Freq: Three times a day (TID) | ORAL | Status: DC
  Administered 2023-01-31 – 2023-02-02 (×7): 600 mg via ORAL
  Filled 2023-01-31 (×7): qty 2

## 2023-01-31 MED ORDER — INSULIN GLARGINE-YFGN 100 UNIT/ML ~~LOC~~ SOLN
30.0000 [IU] | Freq: Every day | SUBCUTANEOUS | Status: DC
  Administered 2023-01-31: 30 [IU] via SUBCUTANEOUS
  Filled 2023-01-31 (×2): qty 0.3

## 2023-01-31 MED ORDER — HEPARIN (PORCINE) 25000 UT/250ML-% IV SOLN
1350.0000 [IU]/h | INTRAVENOUS | Status: DC
Start: 2023-01-31 — End: 2023-02-01

## 2023-01-31 MED ORDER — BUSPIRONE HCL 5 MG PO TABS
15.0000 mg | ORAL_TABLET | Freq: Every day | ORAL | Status: DC | PRN
  Administered 2023-02-02: 15 mg via ORAL
  Filled 2023-01-31: qty 3

## 2023-01-31 MED ORDER — ACETAMINOPHEN 650 MG RE SUPP: 650.0000 mg | Freq: Four times a day (QID) | RECTAL | Status: DC | PRN

## 2023-01-31 MED ORDER — INSULIN ASPART 100 UNIT/ML IJ SOLN
0.0000 [IU] | Freq: Three times a day (TID) | INTRAMUSCULAR | Status: DC
  Administered 2023-01-31: 1 [IU] via SUBCUTANEOUS
  Administered 2023-02-01: 3 [IU] via SUBCUTANEOUS
  Administered 2023-02-02: 1 [IU] via SUBCUTANEOUS

## 2023-01-31 MED ORDER — ACETAMINOPHEN 325 MG PO TABS
650.0000 mg | ORAL_TABLET | Freq: Four times a day (QID) | ORAL | Status: DC | PRN
  Filled 2023-01-31: qty 2

## 2023-01-31 MED ORDER — OXYCODONE HCL 10 MG PO TABS: 10.0000 mg | ORAL_TABLET | Freq: Two times a day (BID) | ORAL | Status: DC | PRN

## 2023-01-31 MED ORDER — OXYCODONE-ACETAMINOPHEN 5-325 MG PO TABS: 1.0000 | ORAL_TABLET | Freq: Four times a day (QID) | ORAL | Status: DC | PRN

## 2023-01-31 NOTE — Progress Notes (Signed)
PHARMACY - ANTICOAGULATION CONSULT NOTE  Pharmacy Consult for heparin infusion Indication: pulmonary embolus  Allergies  Allergen Reactions   Lisinopril Swelling    Angioedema   Oxycodone-Acetaminophen Nausea Only   Latex Itching, Swelling and Rash   Morphine Itching and Rash    Patient Measurements: Height: 6' (182.9 cm) Weight: 77.4 kg (170 lb 11.2 oz) IBW/kg (Calculated) : 73.1 Heparin Dosing Weight: 81.6 kg  Vital Signs: Temp: 98.6 F (37 C) (12/21 1220) Temp Source: Oral (12/21 1220) BP: 151/102 (12/21 1220) Pulse Rate: 86 (12/21 1220)  Labs: Recent Labs    01/29/23 0637 01/29/23 0747 01/30/23 2055 01/31/23 0520 01/31/23 0728 01/31/23 1249  HGB 11.6*  --  12.4 11.2*  --   --   HCT 36.5  --  37.6 33.9*  --   --   PLT 477*  --  565* 513*  --   --   APTT  --   --  39* 62*  --  128*  HEPARINUNFRC  --   --   --  >1.10*  --   --   CREATININE  --  1.27* 1.01*  --  1.13*  --   TROPONINIHS 3 3 4   --   --   --     Estimated Creatinine Clearance: 62.6 mL/min (A) (by C-G formula based on SCr of 1.13 mg/dL (H)).   Medical History: Past Medical History:  Diagnosis Date   Abnormal Pap smear of cervix    Anxiety 01/22/2016   Asthma    Cardiac murmur 02/01/2019   Cardiomyopathy, secondary (HCC) 09/06/2009   Qualifier: Diagnosis of  By: Jolene Provost   Formatting of this note might be different from the original. Overview:  Qualifier: Diagnosis of  By: Jolene Provost   Cataract    Cervical cancer [facility]) 2001   Radical hysterectomy in [facility]   Chronic kidney disease, stage III (moderate) (HCC) 05/23/2014   Chronic pain syndrome 01/23/2016   Coronary artery disease    30% lesions noted   Depression    Diabetes type 2, uncontrolled 05/23/2014   Diabetic gastroparesis associated with type 1 diabetes mellitus (HCC) 11/27/2016   DIABETIC PERIPHERAL NEUROPATHY 09/21/2009   Qualifier: Diagnosis of  By: Delrae Alfred MD, [name]      Essential hypertension 08/30/2009   Qualifier: Diagnosis of  By: Cristela Felt, CNA, Christy     Gastroesophageal reflux disease 07/30/2009   Formatting of this note might be different from the original. Overview:  Overview:  Qualifier: Diagnosis of  By: Delrae Alfred MD, Alfonso Patten: Diagnosis of  By: Wilmon Pali NP, Consuela Mimes of this note might be different from the original. Overview:  Qualifier: Diagnosis of  By: Delrae Alfred MD, Alfonso Patten: Diagnosis of  By: Wilmon Pali NP, Gunnar Fusi   GERD (gastroesophageal reflux disease)    History of cervical cancer 08/17/2015   Status post partial hysterectomy  Formatting of this note might be different from the original. Status post partial hysterectomy   Hyperlipidemia    INSOMNIA 09/21/2009   Qualifier: Diagnosis of  By: Delrae Alfred MD, [name]     Lumbar facet arthropathy 05/14/2016   Lumbar spinal stenosis 02/11/2018   Metatarsalgia of both feet 03/11/2017   Migraine 09/13/2012   Overview:  IMPRESSION: possible abd migraine   TOBACCO ABUSE 09/21/2009   Qualifier: Diagnosis of  By: Delrae Alfred MD, Beverely Low of this note might be different from the original. Overview:  Qualifier: Diagnosis of  By: Delrae Alfred  MD, Lanora Manis   Trigeminal neuralgia    ULCER-GASTRIC 09/28/2009   Qualifier: Diagnosis of  By: Wilmon Pali NP, Leeann Must, CANDIDAL 12/25/2009   Qualifier: Diagnosis of  By: Delrae Alfred MD, Hubbard Robinson D deficiency 04/25/2013    Medications:  Medications Prior to Admission  Medication Sig Dispense Refill Last Dose/Taking   albuterol (VENTOLIN HFA) 108 (90 Base) MCG/ACT inhaler Inhale 2 puffs into the lungs every 6 (six) hours as needed for wheezing or shortness of breath. 18 g 2 Past Week   amLODipine (NORVASC) 5 MG tablet Take 1 tablet (5 mg total) by mouth daily. 90 tablet 3 Past Week   apixaban (ELIQUIS) 5 MG TABS tablet Take 2 tablets (10mg ) twice daily for 7 days, then 1 tablet (5mg ) twice daily 60 tablet 0  01/30/2023 at  8:00 AM   aspirin 81 MG tablet Take 81 mg by mouth daily.   Past Week   atorvastatin (LIPITOR) 80 MG tablet Take 1 tablet (80 mg total) by mouth daily. 90 tablet 3 Past Month   baclofen (LIORESAL) 10 MG tablet Take 1 tablet (10 mg total) by mouth 3 (three) times daily. (Patient taking differently: Take 10 mg by mouth 3 (three) times daily as needed for muscle spasms.) 30 each 0 Past Month   busPIRone (BUSPAR) 15 MG tablet Take 1 tablet (15 mg total) by mouth 2 (two) times daily. (Patient taking differently: Take 15 mg by mouth daily as needed (anxiety).) 60 tablet 4 Taking Differently   carvedilol (COREG) 25 MG tablet Take 1 tablet (25 mg total) by mouth 2 (two) times daily with a meal. 180 tablet 0 01/29/2023   doxycycline (VIBRAMYCIN) 100 MG capsule Take 1 capsule (100 mg total) by mouth 2 (two) times daily. 14 capsule 0 01/30/2023   fluticasone furoate-vilanterol (BREO ELLIPTA) 100-25 MCG/INH AEPB Inhale 1 puff into the lungs daily. (Patient taking differently: Inhale 1 puff into the lungs daily as needed (Asthma).) 100 each 2 Past Week   gabapentin (NEURONTIN) 300 MG capsule TAKE 2 CAPSULES BY MOUTH THREE TIMES DAILY 270 capsule 0 Past Week   Insulin Glargine (BASAGLAR KWIKPEN) 100 UNIT/ML INJECT 40 UNITS SUBCUTANEOUSLY AT BEDTIME 12 mL 0 Past Week   metFORMIN (GLUCOPHAGE-XR) 500 MG 24 hr tablet Take 1 tablet (500 mg total) by mouth daily with breakfast. 60 tablet 2 Past Week   nitroGLYCERIN (NITROSTAT) 0.4 MG SL tablet Place 1 tablet (0.4 mg total) under the tongue every 5 (five) minutes as needed for chest pain. 30 tablet 3 Taking As Needed   Oxycodone HCl 10 MG TABS 1 tab po bid prn severe pain. 60 tablet 0 01/30/2023   OZEMPIC, 0.25 OR 0.5 MG/DOSE, 2 MG/3ML SOPN Inject 0.5 mg into the skin once a week. mondays   Past Week   pantoprazole (PROTONIX) 40 MG tablet Take 1 tablet (40 mg total) by mouth 2 (two) times daily. 60 tablet 0 Past Week   promethazine (PHENERGAN) 12.5 MG  tablet Take 1 tablet (12.5 mg total) by mouth every 8 (eight) hours as needed for nausea or vomiting. 20 tablet 0 Past Week   topiramate (TOPAMAX) 50 MG tablet Take 1 tablet (50 mg total) by mouth 2 (two) times daily. (Patient taking differently: Take 50 mg by mouth 2 (two) times daily as needed (headache).) 60 tablet 12 Taking Differently   VITAMIN D PO Take 5,000 Units by mouth daily.   Taking   Continuous Glucose Sensor (FREESTYLE LIBRE 3 SENSOR)  MISC USE AS DIRECTED TO MONITOR BLOOD SUGAR. CHANGE EVERY 14 DAYS      GAVILYTE-N WITH FLAVOR PACK 420 g solution Take 4,000 mLs by mouth once. (Patient not taking: Reported on 01/31/2023)   Not Taking   Insulin Pen Needle (PEN NEEDLES 31GX5/16") 31G X 8 MM MISC Use daily as directed 100 each 0    metoCLOPramide (REGLAN) 5 MG tablet Take 1 tablet (5 mg total) by mouth 3 (three) times daily before meals. (Patient not taking: Reported on 01/31/2023) 90 tablet 0 Not Taking   oxyCODONE (OXY IR/ROXICODONE) 5 MG immediate release tablet Take 1 tablet (5 mg total) by mouth every 4 (four) hours as needed for severe pain (pain score 7-10). (Patient not taking: Reported on 01/31/2023) 30 tablet 0 Not Taking    Assessment: 57 yo F presents to ED with SOB and increased back pain, despite initiation of apixaban 10mg  BID for acute PE on 12/19 AM. Patient was not taking apixaban prior to 01/29/23. Last dose of apixaban 10mg  was 0800 on 12/20. Pharmacy consulted to dose heparin infusion for acute PE.    aPTT supratherapeutic (128) after increasing rate from 1400 to 1500 units/hr, however, unable to confirm if lab was drawn appropriately per  RN or phlebotomy. Patient stated lab was drawn from same arm as heparin infusion.  RN also notes that heparin infusion discovered to be turned off for an unknown amount of time around 1400 and was subsequently restarted at this time. RN suspects lab turned it off when they got aPTT around 1249, which would mean heparin was off for ~1  hour, however patient notes she might have accidentally turned it off since then when trying to turn off sound.  No s/sx of bleeding per RN. Since it's unclear if aPTT was appropriately drawn, will preemptively dose reduce just slightly to 1450 units/hr as aPTT was close to therapeutic (0.62) on 1400 units/hr.  Goal of Therapy:  Heparin level 0.3-0.7 units/ml aPTT 66-102 seconds Monitor platelets by anticoagulation protocol: Yes   Plan:  Decrease heparin infusion to 1450 units/hr Check aPTT in 6 hours Monitor daily CBC, heparin level, and for s/sx of bleeding  Nicole Kindred, PharmD PGY1 Pharmacy Resident 01/31/2023 2:31 PM

## 2023-01-31 NOTE — ED Notes (Signed)
Pt c/o nausea MD made aware . MD Margo Aye ordered medications.

## 2023-01-31 NOTE — H&P (Signed)
History and Physical    Patient: [name]:[id number] DOB: 11-11-1965 DOA: 01/30/2023 DOS: the patient was seen and examined on 01/31/2023 PCP: Esperanza Richters, PA-C  Patient coming from: Transfer from [facility]  Chief Complaint:  Chief Complaint  Patient presents with   pulmonary embolism   Shortness of Breath   HPI: [name] is a 57 y.o. female with medical history significant of hypertension, hyperlipidemia, coronary artery disease, cardiomyopathy, diabetes mellitus, CKD stage IIIa, chronic back pain, peptic ulcer disease, and anxiety presented with worsening shortness of breath and severe pain that started a 1-2 days prior to her initial visit on the ED on the 19th.  She had been having left posterior flank pain as well as shortness of breath, but had denied any heavy lifting or injury to onset symptoms.  Noted associated symptoms of a cough and inability to take deep breath.  Labs revealed elevated D-dimer at 1.3. CT angiogram of the chest obtained as initial chest x-ray noted no acute abnormality which revealed pulmonary embolism with segmental to subsegmental embolus in the left lower lobe without signs of right heart strain, groundglass opacities in the right upper, mild global cardiomegaly and coronary artery disease. She had been prescribed Eliquis, doxycycline, and an oxycodone.  However, the patient reported worsening flank pain and increased shortness of breath despite taking medications for which she came back to the hospital.  The patient recently started a new job in the last month that involves prolonged periods of sitting at a desk.  The patient's diabetes is managed with insulin and metformin, and she also take Ozempic once a week. The patient's hypertension is managed with amlodipine and Coreg. The patient also takes atorvastatin, Buspar as needed for anxiety, and oxycodone as needed for back pain related to previous strenuous work. The patient uses an  albuterol inhaler and Breo for asthma.  The patient was started on Eliquis during the initial hospital visit for suspected blood clots. The worsening symptoms upon return to the hospital suggest possible residual clot in the leg that may have broken off. The patient also reported feeling sick to the stomach, which they attribute to the pain medication and overall feeling of illness. The patient denied any leg swelling or calf pain.   In the emergency department patient was noted to be afebrile with heart rates elevated up to 115, blood pressures 113/91 to 165/110, and O2 saturations as low as 85% on room air with improvement on 2 L of nasal cannula oxygen to at least 90%.  Labs since yesterday noted hemoglobin 12.4-> 11.2 and platelets 565-> 513.  Chest x-ray noted no acute abnormality.  Patient had been started on a heparin drip.   Review of Systems: As mentioned in the history of present illness. All other systems reviewed and are negative. Past Medical History:  Diagnosis Date   Abnormal Pap smear of cervix    Anxiety 01/22/2016   Asthma    Cardiac murmur 02/01/2019   Cardiomyopathy, secondary (HCC) 09/06/2009   Qualifier: Diagnosis of  By: Jolene Provost   Formatting of this note might be different from the original. Overview:  Qualifier: Diagnosis of  By: Jolene Provost   Cataract    Cervical cancer [facility]) 2001   Radical hysterectomy in [facility]   Chronic kidney disease, stage III (moderate) (HCC) 05/23/2014   Chronic pain syndrome 01/23/2016   Coronary artery disease    30% lesions noted   Depression    Diabetes  type 2, uncontrolled 05/23/2014   Diabetic gastroparesis associated with type 1 diabetes mellitus (HCC) 11/27/2016   DIABETIC PERIPHERAL NEUROPATHY 09/21/2009   Qualifier: Diagnosis of  By: Delrae Alfred MD, Lanora Manis     Essential hypertension 08/30/2009   Qualifier: Diagnosis of  By: Cristela Felt, CNA, Christy     Gastroesophageal reflux disease  07/30/2009   Formatting of this note might be different from the original. Overview:  Overview:  Qualifier: Diagnosis of  By: Delrae Alfred MD, Alfonso Patten: Diagnosis of  By: Wilmon Pali NP, Consuela Mimes of this note might be different from the original. Overview:  Qualifier: Diagnosis of  By: Delrae Alfred MD, Alfonso Patten: Diagnosis of  By: Wilmon Pali NP, Gunnar Fusi   GERD (gastroesophageal reflux disease)    History of cervical cancer 08/17/2015   Status post partial hysterectomy  Formatting of this note might be different from the original. Status post partial hysterectomy   Hyperlipidemia    INSOMNIA 09/21/2009   Qualifier: Diagnosis of  By: Delrae Alfred MD, [name]     Lumbar facet arthropathy 05/14/2016   Lumbar spinal stenosis 02/11/2018   Metatarsalgia of both feet 03/11/2017   Migraine 09/13/2012   Overview:  IMPRESSION: possible abd migraine   TOBACCO ABUSE 09/21/2009   Qualifier: Diagnosis of  By: Delrae Alfred MD, Beverely Low of this note might be different from the original. Overview:  Qualifier: Diagnosis of  By: Delrae Alfred MD, [name]   Trigeminal neuralgia    ULCER-GASTRIC 09/28/2009   Qualifier: Diagnosis of  By: Wilmon Pali NP, Leeann Must, CANDIDAL 12/25/2009   Qualifier: Diagnosis of  By: Delrae Alfred MD, Hubbard Robinson D deficiency 04/25/2013   Past Surgical History:  Procedure Laterality Date   ABDOMINAL HYSTERECTOMY     radical hysterectomy   AMPUTATION Left 01/06/2022   Procedure: AMPUTATION 2nd TOE;  Surgeon: Nadara Mustard, MD;  Location: [facility] OR;  Service: Orthopedics;  Laterality: Left;   BIOPSY  11/06/2022   Procedure: BIOPSY;  Surgeon: Imogene Burn, MD;  Location: [facility] ENDOSCOPY;  Service: Gastroenterology;;   CHOLECYSTECTOMY     ESOPHAGOGASTRODUODENOSCOPY (EGD) WITH PROPOFOL N/A 11/06/2022   Procedure: ESOPHAGOGASTRODUODENOSCOPY (EGD) WITH PROPOFOL;  Surgeon: Imogene Burn, MD;  Location: [facility] ENDOSCOPY;  Service: Gastroenterology;   Laterality: N/A;   I & D EXTREMITY Left 04/24/2021   Procedure: LEFT LEG DEBRIDEMENT;  Surgeon: Nadara Mustard, MD;  Location[facility] OR;  Service: Orthopedics;  Laterality: Left;   I & D EXTREMITY Left 05/01/2021   Procedure: LEFT KNEE DEBRIDEMENT;  Surgeon: Nadara Mustard, MD;  Location: [facility] OR;  Service: Orthopedics;  Laterality: Left;   Social History:  reports that she quit smoking about 34 years ago. Her smoking use included cigarettes. She has never used smokeless tobacco. She reports current alcohol use. She reports that she does not use drugs.  Allergies  Allergen Reactions   Lisinopril Swelling    Angioedema   Oxycodone-Acetaminophen Nausea Only   Latex Itching, Swelling and Rash   Morphine Itching and Rash    Family History  Problem Relation Age of Onset   Breast cancer Mother    Stroke Mother    Hypertension Mother    Diabetes Mother    Hypertension Father    Diabetes Father    Heart attack Father    Hypertension Sister    Diabetes Sister    Hypertension Brother    Diabetes Brother    Hypertension Brother    Diabetes  Brother    Stroke Maternal Grandmother    Colon cancer Neg Hx    Colon polyps Neg Hx    Esophageal cancer Neg Hx    Stomach cancer Neg Hx    Rectal cancer Neg Hx     Prior to Admission medications   Medication Sig Start Date End Date Taking? Authorizing Provider  albuterol (VENTOLIN HFA) 108 (90 Base) MCG/ACT inhaler Inhale 2 puffs into the lungs every 6 (six) hours as needed for wheezing or shortness of breath. Patient taking differently: Inhale 2 puffs into the lungs 3 (three) times daily as needed for wheezing or shortness of breath. 08/03/20   Saguier, Ramon Dredge, PA-C  amLODipine (NORVASC) 5 MG tablet Take 1 tablet (5 mg total) by mouth daily. 11/17/22 11/12/23  Saguier, Ramon Dredge, PA-C  apixaban (ELIQUIS) 5 MG TABS tablet Take 2 tablets (10mg ) twice daily for 7 days, then 1 tablet (5mg ) twice daily 01/29/23   Jacalyn Lefevre, MD  aspirin 81 MG tablet Take  81 mg by mouth daily.    [provider]  atorvastatin (LIPITOR) 80 MG tablet Take 1 tablet (80 mg total) by mouth daily. 01/20/23   Saguier, Ramon Dredge, PA-C  baclofen (LIORESAL) 10 MG tablet Take 1 tablet (10 mg total) by mouth 3 (three) times daily. 11/17/22   Saguier, Ramon Dredge, PA-C  busPIRone (BUSPAR) 15 MG tablet Take 1 tablet (15 mg total) by mouth 2 (two) times daily. Patient taking differently: Take 15 mg by mouth daily as needed (anxiety). 01/13/22   Saguier, Ramon Dredge, PA-C  carvedilol (COREG) 25 MG tablet Take 1 tablet (25 mg total) by mouth 2 (two) times daily with a meal. 11/17/22   Saguier, Ramon Dredge, PA-C  Continuous Glucose Sensor (FREESTYLE LIBRE 3 SENSOR) MISC USE AS DIRECTED TO MONITOR BLOOD SUGAR. CHANGE EVERY 14 DAYS 08/07/22   [provider]  doxycycline (VIBRAMYCIN) 100 MG capsule Take 1 capsule (100 mg total) by mouth 2 (two) times daily. 01/29/23   Jacalyn Lefevre, MD  fluticasone (FLONASE) 50 MCG/ACT nasal spray Place 2 sprays into both nostrils daily. 01/13/22   Saguier, Ramon Dredge, PA-C  fluticasone furoate-vilanterol (BREO ELLIPTA) 100-25 MCG/INH AEPB Inhale 1 puff into the lungs daily. 08/03/20   Saguier, Ramon Dredge, PA-C  gabapentin (NEURONTIN) 300 MG capsule TAKE 2 CAPSULES BY MOUTH THREE TIMES DAILY 08/12/22   Saguier, Ramon Dredge, PA-C  Insulin Glargine [facility]) 100 UNIT/ML INJECT 40 UNITS SUBCUTANEOUSLY AT BEDTIME 08/12/22   Saguier, Ramon Dredge, PA-C  Insulin Pen Needle (PEN NEEDLES 31GX5/16") 31G X 8 MM MISC Use daily as directed 03/28/22   Saguier, Ramon Dredge, PA-C  lidocaine (LIDODERM) 5 % Place 1 patch onto the skin daily. Remove & Discard patch within 12 hours or as directed by MD 11/08/22   Dorcas Carrow, MD  metFORMIN (GLUCOPHAGE-XR) 500 MG 24 hr tablet Take 1 tablet (500 mg total) by mouth daily with breakfast. 11/17/22   Saguier, Ramon Dredge, PA-C  metoCLOPramide (REGLAN) 5 MG tablet Take 1 tablet (5 mg total) by mouth 3 (three) times daily before meals. 11/08/22 12/08/22   Dorcas Carrow, MD  nitroGLYCERIN (NITROSTAT) 0.4 MG SL tablet Place 1 tablet (0.4 mg total) under the tongue every 5 (five) minutes as needed for chest pain. 03/21/21   Saguier, Ramon Dredge, PA-C  ondansetron (ZOFRAN) 4 MG tablet Take 4 mg by mouth every 8 (eight) hours as needed for nausea or vomiting. Patient not taking: Reported on 12/31/2022    [provider]  oxyCODONE (OXY IR/ROXICODONE) 5 MG immediate release tablet Take 1 tablet (  5 mg total) by mouth every 4 (four) hours as needed for severe pain (pain score 7-10). 01/29/23   Jacalyn Lefevre, MD  Oxycodone HCl 10 MG TABS 1 tab po bid prn severe pain. 11/12/22   Saguier, Ramon Dredge, PA-C  OZEMPIC, 0.25 OR 0.5 MG/DOSE, 2 MG/3ML SOPN Inject 0.5 mg into the skin once a week. mondays 10/14/22   [provider]  pantoprazole (PROTONIX) 40 MG tablet Take 1 tablet (40 mg total) by mouth 2 (two) times daily. 11/08/22 12/08/22  Dorcas Carrow, MD  promethazine (PHENERGAN) 12.5 MG tablet Take 1 tablet (12.5 mg total) by mouth every 8 (eight) hours as needed for nausea or vomiting. 01/08/23   Saguier, Ramon Dredge, PA-C  sucralfate (CARAFATE) 1 g tablet Take 1 tablet (1 g total) by mouth 4 (four) times daily. 11/08/22 11/08/23  Dorcas Carrow, MD  topiramate (TOPAMAX) 50 MG tablet Take 1 tablet (50 mg total) by mouth 2 (two) times daily. Patient taking differently: Take 50 mg by mouth 2 (two) times daily as needed (headache). 01/13/22   Saguier, Ramon Dredge, PA-C  VITAMIN D PO Take 5,000 Units by mouth daily.    [provider]    Physical Exam: Vitals:   01/31/23 0330 01/31/23 0345 01/31/23 0400 01/31/23 0530  BP: (!) 126/93 (!) 139/97 135/86 117/87  Pulse: 97 92 91 91  Resp: 17 (!) 8 13 11   Temp:   98.7 F (37.1 C)   SpO2: 100% 92% 93% 90%  Weight:      Height:      Exam  Constitutional: Middle-aged female currently in no acute distress Eyes: PERRL, lids and conjunctivae normal ENMT: Mucous membranes are moist.  Fair dentition.  Neck:  normal, supple, no masses, no thyromegaly Respiratory: Decreased overall aeration.  Able to talk in fairly complete sentences.  O2 saturations currently maintained on room air. Cardiovascular: Regular rate and rhythm, no murmurs / rubs / gallops. No extremity edema. 2+ pedal pulses.   Abdomen: no tenderness, no masses palpated. No hepatosplenomegaly. Bowel sounds positive.  Musculoskeletal: no clubbing / cyanosis. No joint deformity upper and lower extremities. Good ROM, no contractures. Normal muscle tone.  Skin: no rashes, lesions, ulcers. No induration Neurologic: CN 2-12 grossly intact.  Strength 5/5 in all 4.  Psychiatric: Normal judgment and insight. Alert and oriented x 3. Normal mood.   Data Reviewed:  EKG reveals sinus tachycardia at 112 bpm.  Reviewed labs, imaging, and pertinent records as documented.  Assessment and Plan:  Acute respiratory failure with hypoxia secondary to pulmonary embolism Patient diagnosed with pulmonary embolism on 12/19 and started on Eliquis.  Presented back to the hospital yesterday due to worsening left flank pain and shortness of breath.  Noted to be hypoxic with O2 saturation dropping as low as 85% for which patient had been placed on 2 L nasal cannula oxygen temporarily.  Due to worsening symptoms she was started on a heparin drip and admission requested.  She had been prescribed doxycycline as well as there was concern for groundglass opacities of the right upper lobe for infection. -Admit to a telemetry bed -Continuous pulse oximetry with oxygen maintain O2 saturations -Incentive spirometry and flutter valve -Continue heparin per pharmacy -Check procalcitonin.  Patient is afebrile with no significant white blood cell count.  Plan to continue doxycycline if procalcitonin elevated.  -Check Doppler ultrasound of the lower extremities and echocardiogram -Check pulse oximetry with ambulation once Doppler ultrasound verifies no signs of  DVT -Oxycodone/Dilaudid IV as needed for pain -Plan  to transition back to Eliquis as long as no significant abnormalities noted echocardiogram  Normocytic anemia Hemoglobin 11.2 which appears near patient baseline of 11 to 13 g/dL. -Continue to monitor  Thrombocytosis Acute.  Platelet count 565-> 513.  Thought likely to be reactive in nature. -Continue to monitor  Coronary artery disease Cardiomegaly Patient with prior negative heart study with mildly reduced ejection fraction back in 02/2019.  Last echocardiogram from 04/2021 noted EF to be 60 to 65% with normal diastolic parameters.  She had been on aspirin and statin. -Held aspirin due to anticoagulation -Continue statin -Follow-up echocardiogram -Encouraged patient to keep follow-up appointment scheduled with cardiology on February 14  Diabetes mellitus type 2, with long-term use of insulin Last hemoglobin A1c noted to be 7.4 when checked 12/2022.  Patient is on Lantus 40 mg nightly which she did not take yesterday evening, Ozempic, and metformin. -Hypoglycemic protocols -Hold metformin -Substituted Semglee at reduced dose while in the hospital -CBGs before every meal with sensitive SSI -Adjust regimen as needed  Hypertensive urgency Blood pressures noted to be elevated up to 165/110.  Possibly related uncontrolled pain. -Continue amlodipine and Coreg -Hydralazine IV as needed for elevated blood pressure  Hyperlipidemia Managed with atorvastatin. -Continue current regimen.  Chronic kidney disease stage IIIa Creatinine appears near baseline. -Continue to monitor  Chronic back pain Managed with oxycodone as needed. -Continue current regimen.  Asthma without acute exacerbation Managed with albuterol and Breo. -Continue current regimen.  Hypomagnesemia Magnesium noted to be 1.4 -Order magnesium replacement.  DVT prophylaxis: Heparin Advance Care Planning:   Code Status: Full Code   Consults: None  Family  Communication: Family updated over the phone  Severity of Illness: The appropriate patient status for this patient is INPATIENT. Inpatient status is judged to be reasonable and necessary in order to provide the required intensity of service to ensure the patient's safety. The patient's presenting symptoms, physical exam findings, and initial radiographic and laboratory data in the context of their chronic comorbidities is felt to place them at high risk for further clinical deterioration. Furthermore, it is not anticipated that the patient will be medically stable for discharge from the hospital within 2 midnights of admission.   * I certify that at the point of admission it is my clinical judgment that the patient will require inpatient hospital care spanning beyond 2 midnights from the point of admission due to high intensity of service, high risk for further deterioration and high frequency of surveillance required.*  Author: Clydie Braun, MD 01/31/2023 6:45 AM  For on call review [email or web address].

## 2023-01-31 NOTE — Progress Notes (Signed)
PHARMACY - ANTICOAGULATION CONSULT NOTE  Pharmacy Consult for heparin infusion Indication: pulmonary embolus  Allergies  Allergen Reactions   Lisinopril Swelling    Angioedema   Oxycodone-Acetaminophen Nausea Only   Latex Itching, Swelling and Rash   Morphine Itching and Rash    Patient Measurements: Height: 6' (182.9 cm) Weight: 77.4 kg (170 lb 11.2 oz) IBW/kg (Calculated) : 73.1 Heparin Dosing Weight: 81.6 kg  Vital Signs: Temp: 98.3 F (36.8 C) (12/21 1910) Temp Source: Oral (12/21 1910) BP: 110/76 (12/21 1910) Pulse Rate: 91 (12/21 1930)  Labs: Recent Labs    01/29/23 0637 01/29/23 1610 01/29/23 0747 01/30/23 2055 01/31/23 0520 01/31/23 0728 01/31/23 1249 01/31/23 2045  HGB 11.6*  --   --  12.4 11.2*  --   --   --   HCT 36.5  --   --  37.6 33.9*  --   --   --   PLT 477*  --   --  565* 513*  --   --   --   APTT  --    < >  --  39* 62*  --  128* 136*  HEPARINUNFRC  --   --   --   --  >1.10*  --   --   --   CREATININE  --   --  1.27* 1.01*  --  1.13*  --   --   TROPONINIHS 3  --  3 4  --   --   --   --    < > = values in this interval not displayed.    Estimated Creatinine Clearance: 62.6 mL/min (A) (by C-G formula based on SCr of 1.13 mg/dL (H)).   Medical History: Past Medical History:  Diagnosis Date   Abnormal Pap smear of cervix    Anxiety 01/22/2016   Asthma    Cardiac murmur 02/01/2019   Cardiomyopathy, secondary (HCC) 09/06/2009   Qualifier: Diagnosis of  By: Jolene Provost   Formatting of this note might be different from the original. Overview:  Qualifier: Diagnosis of  By: Jolene Provost   Cataract    Cervical cancer [facility]) 2001   Radical hysterectomy in [facility]   Chronic kidney disease, stage III (moderate) (HCC) 05/23/2014   Chronic pain syndrome 01/23/2016   Coronary artery disease    30% lesions noted   Depression    Diabetes type 2, uncontrolled 05/23/2014   Diabetic gastroparesis associated with  type 1 diabetes mellitus (HCC) 11/27/2016   DIABETIC PERIPHERAL NEUROPATHY 09/21/2009   Qualifier: Diagnosis of  By: Delrae Alfred MD, [name]     Essential hypertension 08/30/2009   Qualifier: Diagnosis of  By: Cristela Felt, CNA, Christy     Gastroesophageal reflux disease 07/30/2009   Formatting of this note might be different from the original. Overview:  Overview:  Qualifier: Diagnosis of  By: Delrae Alfred MD, Alfonso Patten: Diagnosis of  By: Wilmon Pali NP, Consuela Mimes of this note might be different from the original. Overview:  Qualifier: Diagnosis of  By: Delrae Alfred MD, Alfonso Patten: Diagnosis of  By: Wilmon Pali NP, Gunnar Fusi   GERD (gastroesophageal reflux disease)    History of cervical cancer 08/17/2015   Status post partial hysterectomy  Formatting of this note might be different from the original. Status post partial hysterectomy   Hyperlipidemia    INSOMNIA 09/21/2009   Qualifier: Diagnosis of  By: Delrae Alfred MD, [name]     Lumbar facet arthropathy 05/14/2016   Lumbar spinal stenosis  02/11/2018   Metatarsalgia of both feet 03/11/2017   Migraine 09/13/2012   Overview:  IMPRESSION: possible abd migraine   TOBACCO ABUSE 09/21/2009   Qualifier: Diagnosis of  By: Delrae Alfred MD, Beverely Low of this note might be different from the original. Overview:  Qualifier: Diagnosis of  By: Delrae Alfred MD, [name]   Trigeminal neuralgia    ULCER-GASTRIC 09/28/2009   Qualifier: Diagnosis of  By: Wilmon Pali NP, Leeann Must, CANDIDAL 12/25/2009   Qualifier: Diagnosis of  By: Delrae Alfred MD, Hubbard Robinson D deficiency 04/25/2013    Medications:  Medications Prior to Admission  Medication Sig Dispense Refill Last Dose/Taking   albuterol (VENTOLIN HFA) 108 (90 Base) MCG/ACT inhaler Inhale 2 puffs into the lungs every 6 (six) hours as needed for wheezing or shortness of breath. 18 g 2 Past Week   amLODipine (NORVASC) 5 MG tablet Take 1 tablet (5 mg total) by mouth daily. 90  tablet 3 Past Week   apixaban (ELIQUIS) 5 MG TABS tablet Take 2 tablets (10mg ) twice daily for 7 days, then 1 tablet (5mg ) twice daily 60 tablet 0 01/30/2023 at  8:00 AM   aspirin 81 MG tablet Take 81 mg by mouth daily.   Past Week   atorvastatin (LIPITOR) 80 MG tablet Take 1 tablet (80 mg total) by mouth daily. 90 tablet 3 Past Month   baclofen (LIORESAL) 10 MG tablet Take 1 tablet (10 mg total) by mouth 3 (three) times daily. (Patient taking differently: Take 10 mg by mouth 3 (three) times daily as needed for muscle spasms.) 30 each 0 Past Month   busPIRone (BUSPAR) 15 MG tablet Take 1 tablet (15 mg total) by mouth 2 (two) times daily. (Patient taking differently: Take 15 mg by mouth daily as needed (anxiety).) 60 tablet 4 Taking Differently   carvedilol (COREG) 25 MG tablet Take 1 tablet (25 mg total) by mouth 2 (two) times daily with a meal. 180 tablet 0 01/29/2023   doxycycline (VIBRAMYCIN) 100 MG capsule Take 1 capsule (100 mg total) by mouth 2 (two) times daily. 14 capsule 0 01/30/2023   fluticasone furoate-vilanterol (BREO ELLIPTA) 100-25 MCG/INH AEPB Inhale 1 puff into the lungs daily. (Patient taking differently: Inhale 1 puff into the lungs daily as needed (Asthma).) 100 each 2 Past Week   gabapentin (NEURONTIN) 300 MG capsule TAKE 2 CAPSULES BY MOUTH THREE TIMES DAILY 270 capsule 0 Past Week   Insulin Glargine (BASAGLAR KWIKPEN) 100 UNIT/ML INJECT 40 UNITS SUBCUTANEOUSLY AT BEDTIME 12 mL 0 Past Week   metFORMIN (GLUCOPHAGE-XR) 500 MG 24 hr tablet Take 1 tablet (500 mg total) by mouth daily with breakfast. 60 tablet 2 Past Week   nitroGLYCERIN (NITROSTAT) 0.4 MG SL tablet Place 1 tablet (0.4 mg total) under the tongue every 5 (five) minutes as needed for chest pain. 30 tablet 3 Taking As Needed   Oxycodone HCl 10 MG TABS 1 tab po bid prn severe pain. 60 tablet 0 01/30/2023   OZEMPIC, 0.25 OR 0.5 MG/DOSE, 2 MG/3ML SOPN Inject 0.5 mg into the skin once a week. mondays   Past Week    pantoprazole (PROTONIX) 40 MG tablet Take 1 tablet (40 mg total) by mouth 2 (two) times daily. 60 tablet 0 Past Week   promethazine (PHENERGAN) 12.5 MG tablet Take 1 tablet (12.5 mg total) by mouth every 8 (eight) hours as needed for nausea or vomiting. 20 tablet 0 Past Week   topiramate (TOPAMAX) 50 MG  tablet Take 1 tablet (50 mg total) by mouth 2 (two) times daily. (Patient taking differently: Take 50 mg by mouth 2 (two) times daily as needed (headache).) 60 tablet 12 Taking Differently   VITAMIN D PO Take 5,000 Units by mouth daily.   Taking   Continuous Glucose Sensor (FREESTYLE LIBRE 3 SENSOR) MISC USE AS DIRECTED TO MONITOR BLOOD SUGAR. CHANGE EVERY 14 DAYS      GAVILYTE-N WITH FLAVOR PACK 420 g solution Take 4,000 mLs by mouth once. (Patient not taking: Reported on 01/31/2023)   Not Taking   Insulin Pen Needle (PEN NEEDLES 31GX5/16") 31G X 8 MM MISC Use daily as directed 100 each 0    metoCLOPramide (REGLAN) 5 MG tablet Take 1 tablet (5 mg total) by mouth 3 (three) times daily before meals. (Patient not taking: Reported on 01/31/2023) 90 tablet 0 Not Taking   oxyCODONE (OXY IR/ROXICODONE) 5 MG immediate release tablet Take 1 tablet (5 mg total) by mouth every 4 (four) hours as needed for severe pain (pain score 7-10). (Patient not taking: Reported on 01/31/2023) 30 tablet 0 Not Taking    Assessment: 57 yo F presents to ED with SOB and increased back pain, despite initiation of apixaban 10mg  BID for acute PE on 12/19 AM. Patient was not taking apixaban prior to 01/29/23. Last dose of apixaban 10mg  was 0800 on 12/20. Pharmacy consulted to dose heparin infusion for acute PE.    aPTT 136 sec remains above goal despite rate decrease to 1450 units/hr.  RN confirmed level was drawn from opposite arm of heparin infusion, no s/sx bleeding observed.  Of note, was slightly below goal on 1400 units/hr (aPTT 62 sec).  Goal of Therapy:  Heparin level 0.3-0.7 units/ml aPTT 66-102 seconds Monitor platelets  by anticoagulation protocol: Yes   Plan:  Hold heparin infusion x1h (RN aware) Restart heparin infusion at reduced rate of 1350 units/hr @2300  Check aPTT in 6 hours (~AM labs) Monitor daily CBC, heparin level, and for s/sx of bleeding  Trixie Rude, PharmD Clinical Pharmacist 01/31/2023  10:14 PM

## 2023-01-31 NOTE — ED Notes (Signed)
Report given to 5W nurse

## 2023-01-31 NOTE — Progress Notes (Signed)
Received pt from [facility], Heparin infusion going, pt denies chest pain at current time.No acute distress noted, denied chest pain. SRP, RN

## 2023-01-31 NOTE — Progress Notes (Signed)
(  Carryover admission to the Day Admitter; accepted by Dr.  Margo Aye as transfer from  [facility]  to a  med-tele bed at  [facility]  for acute pulmonary embolism. Please see Dr.  Scharlene Gloss transfer documentation in [facility] Communication for additional details).  I have placed some additional preliminary admit orders via the adult multi-morbid admission order set. I have also continued existing orders for heparin drip, prn IV Dilaudid.  CBC already ordered for this morning.  I have also ordered CMP and magnesium level to be checked this morning.    Newton Pigg, DO Hospitalist

## 2023-01-31 NOTE — Plan of Care (Signed)
Plan of care is reviewed. Pt is progressing. Stable hemodynamically, afebrile, normal respiratory rate and efforts, on room air, SPO2 93%, Chest pain is well controlled with dilaudid and Oxycodone. She is able to rest well. Continue Heparin gtt. Pt is able to ambulate with standby assisted, no SOB, no acute distress.We will continue to monitor.  Problem: Education: Goal: Knowledge of General Education information will improve Description: Including pain rating scale, medication(s)/side effects and non-pharmacologic comfort measures Outcome: Progressing   Problem: Clinical Measurements: Goal: Ability to maintain clinical measurements within normal limits will improve Outcome: Progressing Goal: Will remain free from infection Outcome: Progressing Goal: Diagnostic test results will improve Outcome: Progressing Goal: Respiratory complications will improve Outcome: Progressing Goal: Cardiovascular complication will be avoided Outcome: Progressing   Problem: Activity: Goal: Risk for activity intolerance will decrease Outcome: Progressing   Problem: Pain Management: Goal: General experience of comfort will improve Outcome: Progressing   Problem: Skin Integrity: Goal: Risk for impaired skin integrity will decrease Outcome: Progressing  Filiberto Pinks, RN

## 2023-01-31 NOTE — Progress Notes (Signed)
PHARMACY - ANTICOAGULATION CONSULT NOTE  Pharmacy Consult for heparin Indication: pulmonary embolus  Labs: Recent Labs    01/29/23 0637 01/29/23 0747 01/30/23 2055 01/31/23 0520  HGB 11.6*  --  12.4 11.2*  HCT 36.5  --  37.6 33.9*  PLT 477*  --  565* 513*  APTT  --   --  39* 62*  HEPARINUNFRC  --   --   --  >1.10*  CREATININE  --  1.27* 1.01*  --   TROPONINIHS 3 3 4   --    Assessment: 57yo female subtherapeutic on heparin with initial dosing for PE; no infusion issues or signs of bleeding per RN.  Goal of Therapy:  aPTT 66-102 seconds   Plan:  Increase heparin infusion by ~1 unit/kg/hr to 1500 units/hr. Check PTT in 6 hours.   Vernard Gambles, PharmD, BCPS 01/31/2023 6:51 AM

## 2023-01-31 NOTE — Progress Notes (Signed)
Lower extremity venous duplex completed. Please see CV Procedures for preliminary results.  Shona Simpson, RVT 01/31/23 9:14 AM

## 2023-02-01 ENCOUNTER — Inpatient Hospital Stay (HOSPITAL_COMMUNITY): Payer: Commercial Managed Care - HMO

## 2023-02-01 DIAGNOSIS — I2602 Saddle embolus of pulmonary artery with acute cor pulmonale: Secondary | ICD-10-CM

## 2023-02-01 DIAGNOSIS — I2699 Other pulmonary embolism without acute cor pulmonale: Secondary | ICD-10-CM | POA: Diagnosis not present

## 2023-02-01 LAB — GLUCOSE, CAPILLARY
Glucose-Capillary: 50 mg/dL — ABNORMAL LOW (ref 70–99)
Glucose-Capillary: 79 mg/dL (ref 70–99)
Glucose-Capillary: 95 mg/dL (ref 70–99)
Glucose-Capillary: 248 mg/dL — ABNORMAL HIGH (ref 70–99)
Glucose-Capillary: 183 mg/dL — ABNORMAL HIGH (ref 70–99)

## 2023-02-01 LAB — CBC
HCT: 33 % — ABNORMAL LOW (ref 36.0–46.0)
Hemoglobin: 10.7 g/dL — ABNORMAL LOW (ref 12.0–15.0)
MCH: 28.4 pg (ref 26.0–34.0)
MCHC: 32.4 g/dL (ref 30.0–36.0)
MCV: 87.5 fL (ref 80.0–100.0)
Platelets: 510 10*3/uL — ABNORMAL HIGH (ref 150–400)
RBC: 3.77 MIL/uL — ABNORMAL LOW (ref 3.87–5.11)
RDW: 14.1 % (ref 11.5–15.5)
WBC: 7.4 10*3/uL (ref 4.0–10.5)
nRBC: 0 % (ref 0.0–0.2)

## 2023-02-01 LAB — BASIC METABOLIC PANEL
Anion gap: 7 (ref 5–15)
BUN: 12 mg/dL (ref 6–20)
CO2: 29 mmol/L (ref 22–32)
Calcium: 8.6 mg/dL — ABNORMAL LOW (ref 8.9–10.3)
Chloride: 103 mmol/L (ref 98–111)
Creatinine, Ser: 1.18 mg/dL — ABNORMAL HIGH (ref 0.44–1.00)
GFR, Estimated: 54 mL/min — ABNORMAL LOW (ref >60)
Glucose, Bld: 71 mg/dL (ref 70–99)
Potassium: 3.6 mmol/L (ref 3.5–5.1)
Sodium: 139 mmol/L (ref 135–145)

## 2023-02-01 LAB — ECHOCARDIOGRAM COMPLETE
Calc EF: 50.6 %
Est EF: 50
Height: 72 in
S' Lateral: 3.4 cm
Single Plane A2C EF: 53.2 %
Single Plane A4C EF: 46.3 %
Weight: 2733.7 [oz_av]

## 2023-02-01 LAB — APTT
aPTT: 100 s — ABNORMAL HIGH (ref 24–36)
aPTT: 133 s — ABNORMAL HIGH (ref 24–36)
aPTT: 50 s — ABNORMAL HIGH (ref 24–36)

## 2023-02-01 LAB — MAGNESIUM: Magnesium: 2.2 mg/dL (ref 1.7–2.4)

## 2023-02-01 LAB — HEPARIN LEVEL (UNFRACTIONATED): Heparin Unfractionated: >1.1 [IU]/mL — ABNORMAL HIGH (ref 0.30–0.70)

## 2023-02-01 MED ORDER — INSULIN GLARGINE-YFGN 100 UNIT/ML ~~LOC~~ SOLN
15.0000 [IU] | Freq: Every day | SUBCUTANEOUS | Status: DC
  Administered 2023-02-01: 15 [IU] via SUBCUTANEOUS
  Filled 2023-02-01 (×2): qty 0.15

## 2023-02-01 MED ORDER — HEPARIN (PORCINE) 25000 UT/250ML-% IV SOLN
1250.0000 [IU]/h | INTRAVENOUS | Status: DC
  Administered 2023-02-01 (×2): 1150 [IU]/h via INTRAVENOUS
  Filled 2023-02-01: qty 250

## 2023-02-01 NOTE — Progress Notes (Addendum)
PROGRESS NOTE    [name]  WUJ:[id number] DOB: [birth year] DOA: 01/30/2023 PCP: Esperanza Richters, PA-C    Brief Narrative:   [name] is a 57 y.o. female with past medical history significant for HTN, HLD, coronary artery disease, cardiomyopathy, DM2, CKD stage IIIa, chronic back pain, peptic ulcer disease, and anxiety who presented to [facility] ED on 12/20 with worsening shortness of breath and severe pain.  Initially started 1-2 days prior to her initial visit on the ED on the 19th.  She had been having left posterior flank pain as well as shortness of breath, but had denied any heavy lifting or injury to onset symptoms.  Noted associated symptoms of a cough and inability to take deep breath.  Labs revealed elevated D-dimer at 1.3. CT angiogram of the chest obtained as initial chest x-ray noted no acute abnormality which revealed pulmonary embolism with segmental to subsegmental embolus in the left lower lobe without signs of right heart strain, groundglass opacities in the right upper, mild global cardiomegaly and coronary artery disease. She had been prescribed Eliquis, doxycycline, and an oxycodone; and subsequently discharged home.  However, the patient reported increased shortness of breath despite taking medications for which she came back to the hospital.   The patient recently started a new job in the last month that involves prolonged periods of sitting at a desk.   In the emergency department, temperature 98.2 F, HR 100, RR 15, BP 118/90, SpO2 98% on room air.  WBC 8.5, hemoglobin 12.4, platelet count 565.  Sodium 136, potassium 4.2, chloride 98, CO2 28, glucose 135, BUN 9, creat 1.07.  High sensitive troponin 4.  Procalcitonin less than 0.10.  Chest x-ray with no active cardiopulmonary disease process.  Vascular duplex ultrasound negative for DVT bilaterally.  Patient was started on heparin drip.  TRH consulted for admission for further evaluation management of progressive  dyspnea likely secondary to pulmonary embolism.   Assessment & Plan:   Acute respiratory failure with hypoxia secondary to pulmonary embolism Patient diagnosed with pulmonary embolism on 12/19 and started on Eliquis.  Presented back to the hospital 2/2 shortness of breath.  Noted to be hypoxic with O2 saturation dropping as low as 85% for which patient had been placed on 2 L nasal cannula oxygen temporarily in the ED.  Due to worsening symptoms she was started on a heparin drip and admission requested.  She had been prescribed doxycycline as well as there was concern for groundglass opacities of the right upper lobe for infection; but procalcitonin was negative and discontinued.  Especially focused on bilateral lower extremities negative for DVT. -- TTE: Pending -- Heparin drip, pharmacy consulted for dosing/monitoring -- Ambulatory O2 screen today -- Incentive spirometry  Hypomagnesemia Repleted on admission.  Repeat magnesium 2.2 this morning.  Normocytic anemia Hemoglobin 11.2 which appears near patient baseline of 11 to 13 g/dL.   Thrombocytosis Platelet count 565>513>510.  Thought likely to be reactive in nature. -- CBC daily  Coronary artery disease Cardiomegaly Patient with prior negative heart study with mildly reduced ejection fraction back in 02/2019.  Last echocardiogram from 04/2021 noted EF to be 60 to 65% with normal diastolic parameters.  She had been on aspirin and statin. --Held aspirin due to anticoagulation --Continue statin -- TTE as above -- follow-up appointment scheduled with cardiology on February 14   Diabetes mellitus type 2, with long-term use of insulin Last hemoglobin A1c noted to be 7.4 when checked 12/2022.  Patient is on Lantus  40 mg nightly which she did not take yesterday evening, Ozempic, and metformin. -- Hold metformin -- Semglee 15u [place] daily -- sensitive SSI for coverage -- CBGs qAC/HS   Hypertensive urgency Blood pressures noted to be  elevated up to 165/110.  Possibly related uncontrolled pain. -- Continue amlodipine 5 mg p.o. daily and Coreg 25 mg p.o. twice daily -- Hydralazine 10 mg IV q4h PRN SBP >180 or DBP >110   Hyperlipidemia -- Atorvastatin 80 mg p.o. daily   Chronic kidney disease stage IIIa Creatinine 1.18, baseline   Chronic back pain -- oxycodone 10mg  PO TID PRN   Asthma without acute exacerbation -- Breo Ellipta 1 puff daily -- Albuterol neb every 2 hours.  Wheezing/shortness of breath     DVT prophylaxis: Heparin drip    Code Status: Full Code Family Communication: No pallor present bedside this morning  Disposition Plan:  Level of care: Telemetry Medical Status is: Inpatient Remains inpatient appropriate because: Heparin drip, TTE: Pending, anticipate discharge home likely tomorrow with transition to back to Eliquis    Consultants:  None  Procedures:  TTE: Pending  Antimicrobials:  Doxycycline 12/19 - 12/19   Subjective: Patient seen examined bedside, resting comfortably.  Lying in bed.  Currently having echocardiogram performed.  Shortness of breath improved.  Currently chest pain free.  Oxygen titrated off at rest.  Discussed will have RN performed laboratory O2 screen later today.  Remains on heparin drip with anticipated transition back to Eliquis tomorrow.  No other specific questions, concerns or complaints at this time.  Denies headache, no visual change, no chest pain currently, no nausea/vomiting/diarrhea, no fever/chills/night sweats, no abdominal pain, no focal weakness, no fatigue, no paresthesia.  No acute events overnight per nursing staff.  Objective: Vitals:   02/01/23 0422 02/01/23 0500 02/01/23 0834 02/01/23 0840  BP: 119/87  119/87 118/85  Pulse: 87   91  Resp: 14   14  Temp: 98.3 F (36.8 C)   98.1 F (36.7 C)  TempSrc: Oral   Oral  SpO2: 96%   96%  Weight:  77.5 kg    Height:        Intake/Output Summary (Last 24 hours) at 02/01/2023 1123 Last data  filed at 02/01/2023 0600 Gross per 24 hour  Intake 1117.17 ml  Output --  Net 1117.17 ml   Filed Weights   01/30/23 2035 01/31/23 0629 02/01/23 0500  Weight: 81.6 kg 77.4 kg 77.5 kg    Examination:  Physical Exam: GEN: NAD, alert and oriented x 3, wd/wn HEENT: NCAT, PERRL, EOMI, sclera clear, MMM PULM: CTAB w/o wheezes/crackles, normal respiratory effort, on room air CV: RRR w/o M/G/R GI: abd soft, NTND, NABS, no R/G/M MSK: no peripheral edema, muscle strength globally intact 5/5 bilateral upper/lower extremities NEURO: CN II-XII intact, no focal deficits, sensation to light touch intact PSYCH: normal mood/affect Integumentary: dry/intact, no rashes or wounds    Data Reviewed: I have personally reviewed following labs and imaging studies  CBC: Recent Labs  Lab 01/29/23 0637 01/30/23 2055 01/31/23 0520 02/01/23 0306  WBC 9.2 8.5 7.9 7.4  NEUTROABS 5.5 4.8  --   --   HGB 11.6* 12.4 11.2* 10.7*  HCT 36.5 37.6 33.9* 33.0*  MCV 90.1 86.6 86.7 87.5  PLT 477* 565* 513* 510*   Basic Metabolic Panel: Recent Labs  Lab 01/29/23 0747 01/30/23 2055 01/31/23 0728 02/01/23 0306  NA 142 136 139 139  K 3.9 4.2 3.5 3.6  CL 106 98 101 103  CO2 28 28 28 29   GLUCOSE 91 135* 115* 71  BUN 17 9 10 12   CREATININE 1.27* 1.01* 1.13* 1.18*  CALCIUM 9.1 9.7 8.9 8.6*  MG  --   --  1.5* 2.2   GFR: Estimated Creatinine Clearance: 60 mL/min (A) (by C-G formula based on SCr of 1.18 mg/dL (H)). Liver Function Tests: Recent Labs  Lab 01/29/23 0747 01/31/23 0728  AST 14* 15  ALT 17 16  ALKPHOS 94 91  BILITOT 0.4 0.6  PROT 7.2 7.0  ALBUMIN 3.5 2.7*   Recent Labs  Lab 01/29/23 0747  LIPASE 16   No results for input(s): "AMMONIA" in the last 168 hours. Coagulation Profile: No results for input(s): "INR", "PROTIME" in the last 168 hours. Cardiac Enzymes: No results for input(s): "CKTOTAL", "CKMB", "CKMBINDEX", "TROPONINI" in the last 168 hours. BNP (last 3 results) No  results for input(s): "PROBNP" in the last 8760 hours. HbA1C: No results for input(s): "HGBA1C" in the last 72 hours. CBG: Recent Labs  Lab 01/31/23 1219 01/31/23 1623 01/31/23 2148 02/01/23 0630 02/01/23 0703  GLUCAP 124* 132* 140* 50* 79   Lipid Profile: No results for input(s): "CHOL", "HDL", "LDLCALC", "TRIG", "CHOLHDL", "LDLDIRECT" in the last 72 hours. Thyroid Function Tests: No results for input(s): "TSH", "T4TOTAL", "FREET4", "T3FREE", "THYROIDAB" in the last 72 hours. Anemia Panel: No results for input(s): "VITAMINB12", "FOLATE", "FERRITIN", "TIBC", "IRON", "RETICCTPCT" in the last 72 hours. Sepsis Labs: Recent Labs  Lab 01/31/23 1249  PROCALCITON <0.10    Recent Results (from the past 240 hours)  Resp panel by RT-PCR (RSV, Flu A&B, Covid) Anterior Nasal Swab     Status: None   Collection Time: 01/29/23  7:47 AM   Specimen: Anterior Nasal Swab  Result Value Ref Range Status   SARS Coronavirus 2 by RT PCR NEGATIVE NEGATIVE Final    Comment: (NOTE) SARS-CoV-2 target nucleic acids are NOT DETECTED.  The SARS-CoV-2 RNA is generally detectable in upper respiratory specimens during the acute phase of infection. The lowest concentration of SARS-CoV-2 viral copies this assay can detect is 138 copies/mL. A negative result does not preclude SARS-Cov-2 infection and should not be used as the sole basis for treatment or other patient management decisions. A negative result may occur with  improper specimen collection/handling, submission of specimen other than nasopharyngeal swab, presence of viral mutation(s) within the areas targeted by this assay, and inadequate number of viral copies(<138 copies/mL). A negative result must be combined with clinical observations, patient history, and epidemiological information. The expected result is Negative.  Fact Sheet for Patients:  BloggerCourse.com  Fact Sheet for Healthcare Providers:   SeriousBroker.it  This test is no t yet approved or cleared by the [place] FDA and  has been authorized for detection and/or diagnosis of SARS-CoV-2 by FDA under an Emergency Use Authorization (EUA). This EUA will remain  in effect (meaning this test can be used) for the duration of the COVID-19 declaration under Section 564(b)(1) of the Act, 21 U.S.C.section 360bbb-3(b)(1), unless the authorization is terminated  or revoked sooner.       Influenza A by PCR NEGATIVE NEGATIVE Final   Influenza B by PCR NEGATIVE NEGATIVE Final    Comment: (NOTE) The Xpert Xpress SARS-CoV-2/FLU/RSV plus assay is intended as an aid in the diagnosis of influenza from Nasopharyngeal swab specimens and should not be used as a sole basis for treatment. Nasal washings and aspirates are unacceptable for Xpert Xpress SARS-CoV-2/FLU/RSV testing.  Fact Sheet for Patients: BloggerCourse.com  Fact Sheet for Healthcare Providers: SeriousBroker.it  This test is not yet approved or cleared by the [place] FDA and has been authorized for detection and/or diagnosis of SARS-CoV-2 by FDA under an Emergency Use Authorization (EUA). This EUA will remain in effect (meaning this test can be used) for the duration of the COVID-19 declaration under Section 564(b)(1) of the Act, 21 U.S.C. section 360bbb-3(b)(1), unless the authorization is terminated or revoked.     Resp Syncytial Virus by PCR NEGATIVE NEGATIVE Final    Comment: (NOTE) Fact Sheet for Patients: BloggerCourse.com  Fact Sheet for Healthcare Providers: SeriousBroker.it  This test is not yet approved or cleared by the [place] FDA and has been authorized for detection and/or diagnosis of SARS-CoV-2 by FDA under an Emergency Use Authorization (EUA). This EUA will remain in effect (meaning this test can be used) for  the duration of the COVID-19 declaration under Section 564(b)(1) of the Act, 21 U.S.C. section 360bbb-3(b)(1), unless the authorization is terminated or revoked.  Performed at [facility], [address] [place], [place] [address]          Radiology Studies: VAS [place] LOWER EXTREMITY VENOUS (DVT) Result Date: 02/01/2023  Lower Venous DVT Study Patient Name:  WRENLEY SCHEIN  Date of Exam:   01/31/2023 Medical Rec #: [id number]           Accession #:    [id number] Date of Birth: 1965-03-13          Patient Gender: F Patient Age:   3 years Exam Location:  [facility] Procedure:      VAS [place] LOWER EXTREMITY VENOUS (DVT) Referring Phys: Madelyn Flavors --------------------------------------------------------------------------------  Indications: Pulmonary embolism.  Risk Factors: Hx of PE DVT Hx of DVT past pregnancy. Anticoagulation: Eliquis. Comparison Study: No significant changes seen since previous exam 05/21/21. Performing Technologist: Shona Simpson  Examination Guidelines: A complete evaluation includes B-mode imaging, spectral Doppler, color Doppler, and power Doppler as needed of all accessible portions of each vessel. Bilateral testing is considered an integral part of a complete examination. Limited examinations for reoccurring indications may be performed as noted. The reflux portion of the exam is performed with the patient in reverse Trendelenburg.  +---------+---------------+---------+-----------+----------+--------------+ RIGHT    CompressibilityPhasicitySpontaneityPropertiesThrombus Aging +---------+---------------+---------+-----------+----------+--------------+ CFV      Full           Yes      Yes                                 +---------+---------------+---------+-----------+----------+--------------+ SFJ      Full                                                         +---------+---------------+---------+-----------+----------+--------------+ FV Prox  Full                                                        +---------+---------------+---------+-----------+----------+--------------+ FV Mid   Full                                                        +---------+---------------+---------+-----------+----------+--------------+  FV DistalFull                                                        +---------+---------------+---------+-----------+----------+--------------+ PFV      Full                                                        +---------+---------------+---------+-----------+----------+--------------+ POP      Full           Yes      Yes                                 +---------+---------------+---------+-----------+----------+--------------+ PTV      Full                                                        +---------+---------------+---------+-----------+----------+--------------+ PERO     Full                                                        +---------+---------------+---------+-----------+----------+--------------+   +---------+---------------+---------+-----------+----------+--------------+ LEFT     CompressibilityPhasicitySpontaneityPropertiesThrombus Aging +---------+---------------+---------+-----------+----------+--------------+ CFV      Full           Yes      Yes                                 +---------+---------------+---------+-----------+----------+--------------+ SFJ      Full                                                        +---------+---------------+---------+-----------+----------+--------------+ FV Prox  Full                                                        +---------+---------------+---------+-----------+----------+--------------+ FV Mid   Full                                                         +---------+---------------+---------+-----------+----------+--------------+ FV DistalFull                                                        +---------+---------------+---------+-----------+----------+--------------+  PFV      Full                                                        +---------+---------------+---------+-----------+----------+--------------+ POP      Full           Yes      Yes                                 +---------+---------------+---------+-----------+----------+--------------+ PTV      Full                                                        +---------+---------------+---------+-----------+----------+--------------+ PERO     Full                                                        +---------+---------------+---------+-----------+----------+--------------+     Summary: BILATERAL: - No evidence of deep vein thrombosis seen in the lower extremities, bilaterally. -No evidence of popliteal cyst, bilaterally.   *See table(s) above for measurements and observations. Electronically signed by Coral Else MD on 02/01/2023 at 12:13:11 AM.    Final    DG Chest Portable 1 View Result Date: 01/30/2023 CLINICAL DATA:  Shortness of breath EXAM: PORTABLE CHEST 1 VIEW COMPARISON:  01/29/2023 FINDINGS: Heart and mediastinal contours are within normal limits. No focal opacities or effusions. No acute bony abnormality. IMPRESSION: No active cardiopulmonary disease. Electronically Signed   By: Charlett Nose M.D.   On: 01/30/2023 22:30        Scheduled Meds:  amLODipine  5 mg Oral Daily   atorvastatin  80 mg Oral Daily   carvedilol  25 mg Oral BID WC   fluticasone furoate-vilanterol  1 puff Inhalation Daily   gabapentin  600 mg Oral TID    HYDROmorphone (DILAUDID) injection  0.5 mg Intravenous Once   insulin aspart  0-9 Units Subcutaneous TID WC   insulin glargine-yfgn  30 Units Subcutaneous QHS   pantoprazole  40 mg Oral BID   sodium chloride  flush  3 mL Intravenous Q12H   Continuous Infusions:  heparin 1,350 Units/hr (01/31/23 2312)     LOS: 1 day    Time spent: 56 minutes spent on chart review, discussion with nursing staff, consultants, updating family and interview/physical exam; more than 50% of that time was spent in counseling and/or coordination of care.    Alvira Philips [place], DO [facility] Available via Epic secure chat 7am-7pm After these hours, please refer to coverage provider listed on [email or web address] 02/01/2023, 11:23 AM

## 2023-02-01 NOTE — Progress Notes (Signed)
PHARMACY - ANTICOAGULATION CONSULT NOTE  Pharmacy Consult for heparin infusion Indication: pulmonary embolus  Allergies  Allergen Reactions   Lisinopril Swelling    Angioedema   Oxycodone-Acetaminophen Nausea Only   Latex Itching, Swelling and Rash   Morphine Itching and Rash    Patient Measurements: Height: 6' (182.9 cm) Weight: 77.4 kg (170 lb 11.2 oz) IBW/kg (Calculated) : 73.1 Heparin Dosing Weight: 81.6 kg  Vital Signs: Temp: 98.3 F (36.8 C) (12/22 0422) Temp Source: Oral (12/22 0422) BP: 119/87 (12/22 0422) Pulse Rate: 87 (12/22 0422)  Labs: Recent Labs    01/29/23 0637 01/29/23 1610 01/29/23 0747 01/30/23 2055 01/31/23 0520 01/31/23 0728 01/31/23 1249 01/31/23 2045 02/01/23 0306  HGB 11.6*  --   --  12.4 11.2*  --   --   --  10.7*  HCT 36.5  --   --  37.6 33.9*  --   --   --  33.0*  PLT 477*  --   --  565* 513*  --   --   --  510*  APTT  --    < >  --  39* 62*  --  128* 136* 100*  HEPARINUNFRC  --   --   --   --  >1.10*  --   --   --  >1.10*  CREATININE  --    < > 1.27* 1.01*  --  1.13*  --   --  1.18*  TROPONINIHS 3  --  3 4  --   --   --   --   --    < > = values in this interval not displayed.    Estimated Creatinine Clearance: 60 mL/min (A) (by C-G formula based on SCr of 1.18 mg/dL (H)).  Assessment: 57 yo F presents to ED with SOB and increased back pain, despite initiation of apixaban 10mg  BID for acute PE on 12/19 AM. Patient was not taking apixaban prior to 01/29/23. Last dose of apixaban 10mg  was 0800 on 12/20. Pharmacy consulted to dose heparin infusion for acute PE.    aPTT 100 sec at goal given rate decrease to 1350 units/hr and holding for 1 hour. Per RN, no s/sx bleeding or issues with heparin infusion.   Goal of Therapy:  Heparin level 0.3-0.7 units/ml aPTT 66-102 seconds Monitor platelets by anticoagulation protocol: Yes   Plan:  Continue heparin infusion at rate of 1350 units/hr  Check confirmatory aPTT in 6 hours  Monitor  daily CBC, heparin level, and for s/sx of bleeding  Arabella Merles, PharmD. Clinical Pharmacist 02/01/2023 6:16 AM

## 2023-02-01 NOTE — Progress Notes (Signed)
PHARMACY - ANTICOAGULATION CONSULT NOTE  Pharmacy Consult for heparin infusion Indication: pulmonary embolus  Allergies  Allergen Reactions   Lisinopril Swelling    Angioedema   Oxycodone-Acetaminophen Nausea Only   Latex Itching, Swelling and Rash   Morphine Itching and Rash    Patient Measurements: Height: 6' (182.9 cm) Weight: 77.5 kg (170 lb 13.7 oz) IBW/kg (Calculated) : 73.1 Heparin Dosing Weight: 81.6 kg  Vital Signs: Temp: 98.1 F (36.7 C) (12/22 1613) Temp Source: Oral (12/22 1613) BP: 95/76 (12/22 1613) Pulse Rate: 93 (12/22 2004)  Labs: Recent Labs    01/30/23 2055 01/31/23 0520 01/31/23 0728 01/31/23 1249 02/01/23 0306 02/01/23 1040 02/01/23 1953  HGB 12.4 11.2*  --   --  10.7*  --   --   HCT 37.6 33.9*  --   --  33.0*  --   --   PLT 565* 513*  --   --  510*  --   --   APTT 39* 62*  --    < > 100* 133* 50*  HEPARINUNFRC  --  >1.10*  --   --  >1.10*  --   --   CREATININE 1.01*  --  1.13*  --  1.18*  --   --   TROPONINIHS 4  --   --   --   --   --   --    < > = values in this interval not displayed.    Estimated Creatinine Clearance: 60 mL/min (A) (by C-G formula based on SCr of 1.18 mg/dL (H)).  Assessment: 57 yo F presents to ED with SOB and increased back pain, despite initiation of apixaban 10mg  BID for acute PE on 12/19 AM. Patient was not taking apixaban prior to 01/29/23. Last dose of apixaban 10mg  was 0800 on 12/20. Pharmacy consulted to dose heparin infusion for acute PE.    aPTT 50 sec is below goal after holding infusion x1h and rate reduction to 1150 units/hr.  Level drawn appropriately per RN, no issues noted.  Was slightly below goal on 1400 units/hr (aPTT 62 sec) and therapeutic x1 on 1350 units/hr (aPTT 100 sec) then above goal (aPTT 133 sec) on same rate earlier today.   Anticipating discharge tomorrow with transition back to Eliquis per MD note.  Goal of Therapy:  Heparin level 0.3-0.7 units/ml aPTT 66-102 seconds Monitor  platelets by anticoagulation protocol: Yes   Plan:  Increase heparin infusion to 1250 units/hr  Check aPTT in 6 hours  Monitor daily CBC, heparin level, aPTT, and for s/sx of bleeding F/u plans to transition to Eliquis  Trixie Rude, PharmD Clinical Pharmacist 02/01/2023  8:29 PM

## 2023-02-01 NOTE — Progress Notes (Signed)
   Latest Reference Range & Units 01/31/23 21:48 02/01/23 06:30 02/01/23 07:03   70 - 99 mg/dL 409 (H) 50 (L) 79  (H): Data is abnormally high (L): Data is abnormally low  Hypoglycemic standing order was initiated after her CBG this am 50 mg/dl. One cup of fruit juice was given. Pt was asymptomatic. We will hand off to the day shift team and diabetic coordinator for further reevaluation of long acting insulin at bedtime.  Filiberto Pinks, RN

## 2023-02-01 NOTE — Progress Notes (Signed)
PHARMACY - ANTICOAGULATION CONSULT NOTE  Pharmacy Consult for heparin infusion Indication: pulmonary embolus  Allergies  Allergen Reactions   Lisinopril Swelling    Angioedema   Oxycodone-Acetaminophen Nausea Only   Latex Itching, Swelling and Rash   Morphine Itching and Rash    Patient Measurements: Height: 6' (182.9 cm) Weight: 77.5 kg (170 lb 13.7 oz) IBW/kg (Calculated) : 73.1 Heparin Dosing Weight: 81.6 kg  Vital Signs: Temp: 98.1 F (36.7 C) (12/22 0840) Temp Source: Oral (12/22 0840) BP: 118/85 (12/22 0840) Pulse Rate: 91 (12/22 0840)  Labs: Recent Labs    01/30/23 2055 01/31/23 0520 01/31/23 0728 01/31/23 1249 01/31/23 2045 02/01/23 0306 02/01/23 1040  HGB 12.4 11.2*  --   --   --  10.7*  --   HCT 37.6 33.9*  --   --   --  33.0*  --   PLT 565* 513*  --   --   --  510*  --   APTT 39* 62*  --    < > 136* 100* 133*  HEPARINUNFRC  --  >1.10*  --   --   --  >1.10*  --   CREATININE 1.01*  --  1.13*  --   --  1.18*  --   TROPONINIHS 4  --   --   --   --   --   --    < > = values in this interval not displayed.    Estimated Creatinine Clearance: 60 mL/min (A) (by C-G formula based on SCr of 1.18 mg/dL (H)).  Assessment: 57 yo F presents to ED with SOB and increased back pain, despite initiation of apixaban 10mg  BID for acute PE on 12/19 AM. Patient was not taking apixaban prior to 01/29/23. Last dose of apixaban 10mg  was 0800 on 12/20. Pharmacy consulted to dose heparin infusion for acute PE.    Confirmatory aPTT is supratherapeutic (133 sec) on 1350 units/hr. RN and patient confirmed heparin level was drawn from hand in opposite arm of where heparin infusion is running. Per RN, no s/sx bleeding or issues with heparin infusion. Of note, patient was previously at upper end of therapeutic range on the current rate.  Anticipating discharge tomorrow with transition back to Eliquis per MD note.  Goal of Therapy:  Heparin level 0.3-0.7 units/ml aPTT 66-102  seconds Monitor platelets by anticoagulation protocol: Yes   Plan:  Hold heparin infusion x1h (RN aware) Restart heparin infusion at reduced rate of 1150 units/hr @ 1300 Check aPTT in 6 hours  Monitor daily CBC, heparin level, aPTT, and for s/sx of bleeding F/u plans to transition to Eliquis  Nicole Kindred, PharmD PGY1 Pharmacy Resident 02/01/2023 11:27 AM

## 2023-02-01 NOTE — Progress Notes (Signed)
Mobility Specialist Progress Note:  Nurse requested Mobility Specialist to perform oxygen saturation test with pt which includes removing pt from oxygen both at rest and while ambulating.  Below are the results from that testing.     Patient Saturations on Room Air at Rest = spO2 98%  Patient Saturations on Room Air while Ambulating = sp02 92% .    At end of testing pt left in room on 0  Liters of oxygen.  Reported results to nurse.    Thompson Grayer Mobility Specialist  Please contact vis Secure Chat or  Rehab Office [phone]

## 2023-02-01 NOTE — Progress Notes (Signed)
Mobility Specialist Progress Note:   02/01/23 1148  Mobility  Activity Ambulated with assistance in hallway  Level of Assistance Contact guard assist, steadying assist  Assistive Device None  Distance Ambulated (ft) 480 ft  Activity Response Tolerated well  Mobility Referral Yes  Mobility visit 1 Mobility  Mobility Specialist Start Time (ACUTE ONLY) 1113  Mobility Specialist Stop Time (ACUTE ONLY) 1123  Mobility Specialist Time Calculation (min) (ACUTE ONLY) 10 min   Received pt in bed having no complaints and agreeable to mobility.needed contact guard throughout session d/t mild unsteadiness, otherwise no c/o. Ambulated on RA, SPO2 WFL. Returned to room w/o fault. Left in bed w/ call bell in reach and all needs met.  Pre Mobility RA SPO2 98% During Mobility RA SPO2 92% Post Mobility RA SPO2 98%  Thompson Grayer Mobility Specialist  Please contact vis Secure Chat or  Rehab Office [phone]

## 2023-02-02 ENCOUNTER — Telehealth: Payer: Self-pay | Admitting: *Deleted

## 2023-02-02 ENCOUNTER — Telehealth (HOSPITAL_COMMUNITY): Payer: Self-pay | Admitting: Pharmacy Technician

## 2023-02-02 ENCOUNTER — Other Ambulatory Visit (HOSPITAL_COMMUNITY): Payer: Self-pay

## 2023-02-02 ENCOUNTER — Inpatient Hospital Stay (HOSPITAL_COMMUNITY): Payer: Commercial Managed Care - HMO

## 2023-02-02 DIAGNOSIS — D649 Anemia, unspecified: Secondary | ICD-10-CM | POA: Diagnosis not present

## 2023-02-02 DIAGNOSIS — J9601 Acute respiratory failure with hypoxia: Secondary | ICD-10-CM | POA: Diagnosis not present

## 2023-02-02 DIAGNOSIS — D75839 Thrombocytosis, unspecified: Secondary | ICD-10-CM | POA: Diagnosis not present

## 2023-02-02 DIAGNOSIS — I2699 Other pulmonary embolism without acute cor pulmonale: Secondary | ICD-10-CM | POA: Diagnosis not present

## 2023-02-02 LAB — HEPARIN LEVEL (UNFRACTIONATED): Heparin Unfractionated: >1.1 [IU]/mL — ABNORMAL HIGH (ref 0.30–0.70)

## 2023-02-02 LAB — BASIC METABOLIC PANEL
Anion gap: 9 (ref 5–15)
BUN: 16 mg/dL (ref 6–20)
CO2: 27 mmol/L (ref 22–32)
Calcium: 8.5 mg/dL — ABNORMAL LOW (ref 8.9–10.3)
Chloride: 100 mmol/L (ref 98–111)
Creatinine, Ser: 1.24 mg/dL — ABNORMAL HIGH (ref 0.44–1.00)
GFR, Estimated: 50 mL/min — ABNORMAL LOW (ref >60)
Glucose, Bld: 132 mg/dL — ABNORMAL HIGH (ref 70–99)
Potassium: 3.7 mmol/L (ref 3.5–5.1)
Sodium: 136 mmol/L (ref 135–145)

## 2023-02-02 LAB — GLUCOSE, CAPILLARY: Glucose-Capillary: 139 mg/dL — ABNORMAL HIGH (ref 70–99)

## 2023-02-02 LAB — CBC
HCT: 34.2 % — ABNORMAL LOW (ref 36.0–46.0)
Hemoglobin: 10.9 g/dL — ABNORMAL LOW (ref 12.0–15.0)
MCH: 28.2 pg (ref 26.0–34.0)
MCHC: 31.9 g/dL (ref 30.0–36.0)
MCV: 88.4 fL (ref 80.0–100.0)
Platelets: 496 10*3/uL — ABNORMAL HIGH (ref 150–400)
RBC: 3.87 MIL/uL (ref 3.87–5.11)
RDW: 14.2 % (ref 11.5–15.5)
WBC: 8.7 10*3/uL (ref 4.0–10.5)
nRBC: 0 % (ref 0.0–0.2)

## 2023-02-02 LAB — MAGNESIUM: Magnesium: 1.9 mg/dL (ref 1.7–2.4)

## 2023-02-02 LAB — APTT: aPTT: 93 s — ABNORMAL HIGH (ref 24–36)

## 2023-02-02 MED ORDER — HYDROMORPHONE HCL 1 MG/ML IJ SOLN
0.5000 mg | INTRAMUSCULAR | Status: AC
Start: 2023-02-02 — End: 2023-02-02
  Administered 2023-02-02: 0.5 mg via INTRAVENOUS
  Filled 2023-02-02: qty 0.5

## 2023-02-02 MED ORDER — APIXABAN 5 MG PO TABS
10.0000 mg | ORAL_TABLET | Freq: Two times a day (BID) | ORAL | Status: DC
  Administered 2023-02-02: 10 mg via ORAL
  Filled 2023-02-02: qty 2

## 2023-02-02 MED ORDER — ELIQUIS DVT/PE STARTER PACK 5 MG PO TBPK
ORAL_TABLET | ORAL | 0 refills | Status: DC
  Filled 2023-02-02: qty 74, 30d supply, fill #0

## 2023-02-02 MED ORDER — OXYCODONE HCL 10 MG PO TABS
10.0000 mg | ORAL_TABLET | Freq: Four times a day (QID) | ORAL | 0 refills | Status: DC | PRN
  Filled 2023-02-02: qty 20, 5d supply, fill #0

## 2023-02-02 MED ORDER — APIXABAN 5 MG PO TABS: 5.0000 mg | ORAL_TABLET | Freq: Two times a day (BID) | ORAL | Status: DC

## 2023-02-02 NOTE — Telephone Encounter (Addendum)
Patient Product/process development scientist completed.    The patient is insured through [facility].     Ran test claim for Eliquis 5 mg and the current 30 day co-pay is $4.00.  Ran test claim for Xarelto 20 mg and the current 30 day co-pay is $4.00.  This test claim was processed through [facility]- copay amounts may vary at other pharmacies due to pharmacy/plan contracts, or as the patient moves through the different stages of their insurance plan.     Roland Earl, CPHT Pharmacy Technician III Certified Patient Advocate [facility] Patient Advocate Team Direct Number: [phone]  Fax: [phone]

## 2023-02-02 NOTE — Progress Notes (Signed)
Mobility Specialist Progress Note:   02/02/23 1048  Mobility  Activity Ambulated with assistance in hallway  Level of Assistance Standby assist, set-up cues, supervision of patient - no hands on  Assistive Device None  Distance Ambulated (ft) 480 ft  Activity Response Tolerated well  Mobility Referral Yes  Mobility visit 1 Mobility  Mobility Specialist Start Time (ACUTE ONLY) 1038  Mobility Specialist Stop Time (ACUTE ONLY) 1048  Mobility Specialist Time Calculation (min) (ACUTE ONLY) 10 min   Pre Mobility: 98 HR  During Mobility: 101 HR  Post Mobility: 92 HR   Pt received in bed, agreeable to mobility. C/o headache and back pain, otherwise asx throughout. VSS. Pt returned to bed with call bell in reach and all needs met.   Leory Plowman  Mobility Specialist Please contact via [employer] office at [phone]

## 2023-02-02 NOTE — Progress Notes (Signed)
    02/02/23 0255  Provider Notification  Provider Name/Title Dr. Margo Aye  Date Provider Notified 02/02/23  Time Provider Notified [id number]  Method of Notification Page and call   Notification Reason Requested by patient due to severe left flank pain, left lower chest and left upper quadrant abdominal regions which is not alleviate with pain medications. She got frequent Dilaudid, did not really prefer Oxycodone, and Baclofen, PRN.   Pt was tearfully crying expressed her feeling of pain. Her vitals stable. NSR on the monitor, Neurological intact. Denied SOB, no N/V, no bloody or tarry stools, normal urine output. Normal appetite.   She also cried with pain from inflammation of her gum and/or tooth decayed which she requests medical attentions.    Provider response Dr. Margo Aye visited and assessed Pt at bedside and ordered stat CT abdomen pelvic WO contrast.   Emotional support was given. Treatment plan was reviewed with the Pt.  Date of Provider Response 02/02/23  Time of Provider Response 0300   Filiberto Pinks, RN

## 2023-02-02 NOTE — Progress Notes (Signed)
   02/02/23 1222  TOC Brief Assessment  Insurance and Status Reviewed  Patient has primary care physician Yes  Home environment has been reviewed home with kids  Prior level of function: independent  Prior/Current Home Services No current home services  Social Drivers of Health Review SDOH reviewed no interventions necessary  Readmission risk has been reviewed Yes  Transition of care needs no transition of care needs at this time    Pt stable for transition home today, new to Eliquis- per benefits check- copay $4-TOC pharmacy to fill for discharge. No TOC needs noted.

## 2023-02-02 NOTE — Progress Notes (Addendum)
Pt has had complaints tooth pain. RN assessed and found red and swelling and inflammation on her right side of upper gum likely acute gingivitis.   Pt requested MD for evaluation and treatment at am.   We will hand of to the day shift team.  Filiberto Pinks, RN

## 2023-02-02 NOTE — Discharge Summary (Signed)
Physician Discharge Summary   Patient: [name] MRN: [id number] DOB: [birth year]  Admit date:     01/30/2023  Discharge date: 02/02/23  Discharge Physician: Alberteen Sam   PCP: Esperanza Richters, PA-C     Recommendations at discharge:  Follow up with PCP Esperanza Richters PA-C in 1 week for new PE Follow up with Cardiology Dr. Tomie China in 2 months for HFmrEF Edward Saguier: Please continue Eliquis 6 months and then either stop or continue based on shared decision making for this first time unprovoked VTE Please repeat echo in 4 months (see below), if not completed by Dr. Tomie China Please ensure all age-appropriate cancer screenings have been completed given unprovoked VTE     Discharge Diagnoses: Principal Problem:   Acute pulmonary embolism (HCC) Active Problems:   Normocytic anemia   Coronary artery disease Myopathy   DM2 (diabetes mellitus, type 2) (HCC)   HLD (hyperlipidemia)   Chronic kidney disease, stage IIIa (moderate) (HCC)   Asthma without acute exacerbation   Hypomagnesemia      Hospital Course: 57 y.o. F with DM, HTN, CAD, CKD IIIa and chronic back pain who returned to the ER with chest discomfort and shortness of breath.  Patient developed pleuritic pain and shortness of breath several days prior to admission.  After 2 days, was seen in the ER, CT angiogram showed segmental PE, she was started on Eliquis and discharged.  However she had continued shortness of breath, so she returned to the hospital.  On return, she was hypoxic to 85%, placed on oxygen and admitted for IV heparin.     Acute pulmonary embolism Patient had vascular duplex ultrasounds that were negative for DVT bilaterally.  She had an echocardiogram that showed no right heart failure.  She was hemodynamically stable, and quickly weaned off of oxygen.  Transient hypoxia, respiratory failure ruled out.  She reported flank pain, and a CT abdomen ruled out RP bleeding.    She was  transitioned back to Eliquis and discharged home.  This is a first-time unprovoked PE.  Recommend minimum 6 months of anticoagulation, followed by prolonged anticoagulation given individual risk factors and patient risk preferences.    Cardiomyopathy Patient's EF was previously 55%.  On repeat here, the RH function was normal, but LVEF was 50% without RWMA.  Troponins were normal, ACS felt very unlikely.  This was discussed with Cardiology who recommended repeat echo in 4-6 months.  Patient has follow up with her Cardiologist in 2 months.           The [facility] Controlled Substances Registry was reviewed for this patient prior to discharge.  Consultants: None Procedures performed:  Ultrasound lower extremities CTA chest Echocardiogram CT abdomen    Disposition: Home Diet recommendation:  Discharge Diet Orders (From admission, onward)     Start     Ordered   02/02/23 0000  Diet - low sodium heart healthy        02/02/23 1122             DISCHARGE MEDICATION: Allergies as of 02/02/2023       Reactions   Lisinopril Swelling   Angioedema   Oxycodone-acetaminophen Nausea Only   Latex Itching, Swelling, Rash   Morphine Itching, Rash        Medication List     STOP taking these medications    aspirin 81 MG tablet   doxycycline 100 MG capsule Commonly known as: VIBRAMYCIN   Eliquis 5 MG Tabs tablet Generic drug:  apixaban Replaced by: Eliquis DVT/PE Starter Pack       TAKE these medications    albuterol 108 (90 Base) MCG/ACT inhaler Commonly known as: VENTOLIN HFA Inhale 2 puffs into the lungs every 6 (six) hours as needed for wheezing or shortness of breath.   amLODipine 5 MG tablet Commonly known as: NORVASC Take 1 tablet (5 mg total) by mouth daily.   atorvastatin 80 MG tablet Commonly known as: LIPITOR Take 1 tablet (80 mg total) by mouth daily.   baclofen 10 MG tablet Commonly known as: LIORESAL Take 1 tablet (10 mg total) by  mouth 3 (three) times daily. What changed:  when to take this reasons to take this   Basaglar KwikPen 100 UNIT/ML INJECT 40 UNITS SUBCUTANEOUSLY AT BEDTIME   busPIRone 15 MG tablet Commonly known as: BUSPAR Take 1 tablet (15 mg total) by mouth 2 (two) times daily. What changed:  when to take this reasons to take this   carvedilol 25 MG tablet Commonly known as: COREG Take 1 tablet (25 mg total) by mouth 2 (two) times daily with a meal.   Eliquis DVT/PE Starter Pack Generic drug: Apixaban Starter Pack (10mg  and 5mg ) Take 2 tablets (10mg ) by mouth twice daily for 7 days, then 1 tablet (5mg ) twice daily Replaces: Eliquis 5 MG Tabs tablet   fluticasone furoate-vilanterol 100-25 MCG/INH Aepb Commonly known as: Breo Ellipta Inhale 1 puff into the lungs daily. What changed:  when to take this reasons to take this   FreeStyle Libre 3 Sensor Misc USE AS DIRECTED TO MONITOR BLOOD SUGAR. CHANGE EVERY 14 DAYS   gabapentin 300 MG capsule Commonly known as: NEURONTIN TAKE 2 CAPSULES BY MOUTH THREE TIMES DAILY   metFORMIN 500 MG 24 hr tablet Commonly known as: GLUCOPHAGE-XR Take 1 tablet (500 mg total) by mouth daily with breakfast.   nitroGLYCERIN 0.4 MG SL tablet Commonly known as: NITROSTAT Place 1 tablet (0.4 mg total) under the tongue every 5 (five) minutes as needed for chest pain.   Oxycodone HCl 10 MG Tabs Take 1 tablet (10 mg total) by mouth every 6 (six) hours as needed. What changed:  how much to take how to take this when to take this reasons to take this additional instructions Another medication with the same name was removed. Continue taking this medication, and follow the directions you see here.   Ozempic (0.25 or 0.5 MG/DOSE) 2 MG/3ML Sopn Generic drug: Semaglutide(0.25 or 0.5MG /DOS) Inject 0.5 mg into the skin once a week. mondays   pantoprazole 40 MG tablet Commonly known as: PROTONIX Take 1 tablet (40 mg total) by mouth 2 (two) times daily.   PEN  NEEDLES 31GX5/16" 31G X 8 MM Misc Use daily as directed   promethazine 12.5 MG tablet Commonly known as: PHENERGAN Take 1 tablet (12.5 mg total) by mouth every 8 (eight) hours as needed for nausea or vomiting.   topiramate 50 MG tablet Commonly known as: TOPAMAX Take 1 tablet (50 mg total) by mouth 2 (two) times daily. What changed:  when to take this reasons to take this   VITAMIN D PO Take 5,000 Units by mouth daily.        Follow-up Information     Saguier, Kateri Mc. Schedule an appointment as soon as possible for a visit in 1 week(s).   Specialties: Internal Medicine, Family Medicine Contact information: [address] [place] [address] [phone]  Discharge Instructions     Diet - low sodium heart healthy   Complete by: As directed    Discharge instructions   Complete by: As directed    **IMPORTANT DISCHARGE INSTRUCTIONS**   From Dr. Maryfrances Bunnell: You were admitted for shortness of breath from a blood clot. You were treated here with heparin.    You should resume your Eliquis/apixaban  Take apixaban/Eliquis 10 mg (two tabs) twice daily for 7 days After 7 days, reduce to apixaban/Eliquis 5 mg (one tab) twice daily  Take that for 6 months, then discuss with Esperanza Richters whether to continue or not  Make sure all routine health screenings (mammogram and colon cancer screening are up to date)  For the heart: Continue your cholesterol medicine atorvastatin 80 mg nightly STOP aspirin (now that you are on the Eliquis)  Follow up with Dr. Tomie China in 2 months in Feb (see below)  Have him obtain an echocardiogram in 4 months to re-evaluate your ejection fraction   Increase activity slowly   Complete by: As directed        Discharge Exam: Filed Weights   01/31/23 0629 02/01/23 0500 02/02/23 0537  Weight: 77.4 kg 77.5 kg 78.8 kg    General: Pt is alert, awake, not in acute distress Cardiovascular: RRR, nl  S1-S2, no murmurs appreciated.   No LE edema.   Respiratory: Normal respiratory rate and rhythm.  CTAB without rales or wheezes. Abdominal: Abdomen soft and non-tender.  No distension or HSM.   Neuro/Psych: Strength symmetric in upper and lower extremities.  Judgment and insight appear normal.   Condition at discharge: fair  The results of significant diagnostics from this hospitalization (including imaging, microbiology, ancillary and laboratory) are listed below for reference.   Imaging Studies: CT ABDOMEN PELVIS WO CONTRAST Result Date: 02/02/2023 CLINICAL DATA:  57 year old female presenting with history of flank pain. EXAM: CT ABDOMEN AND PELVIS WITHOUT CONTRAST TECHNIQUE: Multidetector CT imaging of the abdomen and pelvis was performed following the standard protocol without IV contrast. RADIATION DOSE REDUCTION: This exam was performed according to the departmental dose-optimization program which includes automated exposure control, adjustment of the mA and/or kV according to patient size and/or use of iterative reconstruction technique. COMPARISON:  CT of the abdomen and pelvis 11/03/2022. FINDINGS: Lower chest: Aortic atherosclerosis. Hepatobiliary: No definite suspicious cystic or solid hepatic lesions are confidently identified on today's noncontrast CT examination. Status post cholecystectomy. Pancreas: No definite pancreatic mass or peripancreatic fluid collections or inflammatory changes are noted on today's noncontrast CT examination. Spleen: Unremarkable. Adrenals/Urinary Tract: There are no abnormal calcifications within the collecting system of either kidney, along the course of either ureter, or within the lumen of the urinary bladder. No hydroureteronephrosis or perinephric stranding to suggest urinary tract obstruction at this time. The unenhanced appearance of the kidneys is unremarkable bilaterally. Unenhanced appearance of the urinary bladder is normal. Bilateral adrenal glands  are normal in appearance. Stomach/Bowel: Unenhanced appearance of the stomach is normal. No pathologic dilatation of small bowel or colon. Normal appendix. Vascular/Lymphatic: Atherosclerosis in the abdominal aorta and pelvic vasculature. No definite lymphadenopathy noted in the abdomen or pelvis. Numerous surgical clips are noted along the pelvic sidewall and lower retroperitoneum, presumably from prior lymph node dissection. Reproductive: Status post hysterectomy. Ovaries are not confidently identified may be surgically absent or atrophic. Other: No significant volume of ascites.  No pneumoperitoneum. Musculoskeletal: There are no aggressive appearing lytic or blastic lesions noted in the visualized portions of the skeleton. IMPRESSION: 1. No  acute findings are noted in the abdomen or pelvis to account for the patient's symptoms. Specifically, no urinary tract calculi no findings of urinary tract obstruction are noted. 2. Aortic atherosclerosis. 3. Additional incidental findings, as above. Electronically Signed   By: Trudie Reed M.D.   On: 02/02/2023 05:29   ECHOCARDIOGRAM COMPLETE Result Date: 02/01/2023    ECHOCARDIOGRAM REPORT   Patient Name:   RILDA NORWAY Date of Exam: 02/01/2023 Medical Rec #:  [id number]          Height:       72.0 in Accession #:    [id number]         Weight:       170.9 lb Date of Birth:  Apr 25, 1965         BSA:          1.993 m Patient Age:    57 years           BP:           118/85 mmHg Patient Gender: F                  HR:           87 bpm. Exam Location:  Inpatient Procedure: 2D Echo, 3D Echo, Cardiac Doppler and Color Doppler Indications:    I26.02 Pulmonary embolus  History:        Patient has prior history of Echocardiogram examinations, most                 recent 04/23/2021. Cardiomegaly and Cardiomyopathy, CAD; Risk                 Factors:Diabetes and Hypertension.  Sonographer:    Webb Laws Referring Phys: [id number] RONDELL A SMITH IMPRESSIONS  1. Left  ventricular ejection fraction, by estimation, is 50%. Left ventricular ejection fraction by 3D volume is 47 %. The left ventricle has mildly decreased function. The left ventricle demonstrates global hypokinesis. Indeterminate diastolic filling due to E-A fusion.  2. Right ventricular systolic function is low normal. The right ventricular size is moderately enlarged.  3. A small pericardial effusion is present. The pericardial effusion is anterior to the right ventricle.  4. The mitral valve is normal in structure. Trivial mitral valve regurgitation. No evidence of mitral stenosis.  5. The aortic valve was not well visualized. Aortic valve regurgitation is not visualized. No aortic stenosis is present.  6. The inferior vena cava is normal in size with greater than 50% respiratory variability, suggesting right atrial pressure of 3 mmHg. Comparison(s): Prior images reviewed side by side. LVEF decreased from prior. FINDINGS  Left Ventricle: Left ventricular ejection fraction, by estimation, is 50%. Left ventricular ejection fraction by 3D volume is 47 %. The left ventricle has mildly decreased function. The left ventricle demonstrates global hypokinesis. The left ventricular internal cavity size was normal in size. There is no left ventricular hypertrophy. Indeterminate diastolic filling due to E-A fusion. Right Ventricle: The right ventricular size is moderately enlarged. No increase in right ventricular wall thickness. Right ventricular systolic function is low normal. Left Atrium: Left atrial size was normal in size. Right Atrium: Right atrial size was normal in size. Pericardium: A small pericardial effusion is present. The pericardial effusion is anterior to the right ventricle. Mitral Valve: The mitral valve is normal in structure. Trivial mitral valve regurgitation. No evidence of mitral valve stenosis. Tricuspid Valve: The tricuspid valve is normal in structure. Tricuspid valve regurgitation is not  demonstrated.  Aortic Valve: The aortic valve was not well visualized. Aortic valve regurgitation is not visualized. No aortic stenosis is present. Pulmonic Valve: The pulmonic valve was not well visualized. Pulmonic valve regurgitation is not visualized. Aorta: The aortic root, ascending aorta and aortic arch are all structurally normal, with no evidence of dilitation or obstruction. Venous: The inferior vena cava is normal in size with greater than 50% respiratory variability, suggesting right atrial pressure of 3 mmHg. IAS/Shunts: No atrial level shunt detected by color flow Doppler.  LEFT VENTRICLE PLAX 2D LVIDd:         4.50 cm         Diastology LVIDs:         3.40 cm         LV e' medial:  4.82 cm/s LV PW:         1.10 cm         LV e' lateral: 7.77 cm/s LV IVS:        0.80 cm LVOT diam:     2.50 cm LV SV:         59              3D Volume EF LV SV Index:   30              LV 3D EF:    Left LVOT Area:     4.91 cm                     ventricul                                             ar                                             ejection LV Volumes (MOD)                            fraction LV vol d, MOD    71.8 ml                    by 3D A2C:                                        volume is LV vol d, MOD    80.2 ml                    47 %. A4C: LV vol s, MOD    33.6 ml A2C:                           3D Volume EF: LV vol s, MOD    43.1 ml       3D EF:        47 % A4C:                           LV EDV:       124 ml LV SV MOD A2C:   38.2 ml  LV ESV:       66 ml LV SV MOD A4C:   80.2 ml       LV SV:        58 ml LV SV MOD BP:    39.0 ml RIGHT VENTRICLE            IVC RV Basal diam:  3.50 cm    IVC diam: 1.70 cm RV S prime:     9.01 cm/s TAPSE (M-mode): 2.1 cm LEFT ATRIUM             Index        RIGHT ATRIUM           Index LA diam:        3.30 cm 1.66 cm/m   RA Area:     11.80 cm LA Vol (A2C):   34.8 ml 17.46 ml/m  RA Volume:   25.40 ml  12.75 ml/m LA Vol (A4C):   24.3 ml 12.19 ml/m LA Biplane Vol:  29.9 ml 15.01 ml/m  AORTIC VALVE LVOT Vmax:   66.00 cm/s LVOT Vmean:  43.400 cm/s LVOT VTI:    0.121 m  AORTA Ao Root diam: 2.70 cm  SHUNTS Systemic VTI:  0.12 m Systemic Diam: 2.50 cm Riley Lam MD Electronically signed by Riley Lam MD Signature Date/Time: 02/01/2023/12:30:07 PM    Final    VAS [place] LOWER EXTREMITY VENOUS (DVT) Result Date: 02/01/2023  Lower Venous DVT Study Patient Name:  TAKIRA DANEHY  Date of Exam:   01/31/2023 Medical Rec #: [id number]           Accession #:    [id number] Date of Birth: [birth year]          Patient Gender: F Patient Age:   68 years Exam Location:  [facility] Procedure:      VAS [place] LOWER EXTREMITY VENOUS (DVT) Referring Phys: Madelyn Flavors --------------------------------------------------------------------------------  Indications: Pulmonary embolism.  Risk Factors: Hx of PE DVT Hx of DVT past pregnancy. Anticoagulation: Eliquis. Comparison Study: No significant changes seen since previous exam 05/21/21. Performing Technologist: Shona Simpson  Examination Guidelines: A complete evaluation includes B-mode imaging, spectral Doppler, color Doppler, and power Doppler as needed of all accessible portions of each vessel. Bilateral testing is considered an integral part of a complete examination. Limited examinations for reoccurring indications may be performed as noted. The reflux portion of the exam is performed with the patient in reverse Trendelenburg.  +---------+---------------+---------+-----------+----------+--------------+ RIGHT    CompressibilityPhasicitySpontaneityPropertiesThrombus Aging +---------+---------------+---------+-----------+----------+--------------+ CFV      Full           Yes      Yes                                 +---------+---------------+---------+-----------+----------+--------------+ SFJ      Full                                                         +---------+---------------+---------+-----------+----------+--------------+ FV Prox  Full                                                        +---------+---------------+---------+-----------+----------+--------------+  FV Mid   Full                                                        +---------+---------------+---------+-----------+----------+--------------+ FV DistalFull                                                        +---------+---------------+---------+-----------+----------+--------------+ PFV      Full                                                        +---------+---------------+---------+-----------+----------+--------------+ POP      Full           Yes      Yes                                 +---------+---------------+---------+-----------+----------+--------------+ PTV      Full                                                        +---------+---------------+---------+-----------+----------+--------------+ PERO     Full                                                        +---------+---------------+---------+-----------+----------+--------------+   +---------+---------------+---------+-----------+----------+--------------+ LEFT     CompressibilityPhasicitySpontaneityPropertiesThrombus Aging +---------+---------------+---------+-----------+----------+--------------+ CFV      Full           Yes      Yes                                 +---------+---------------+---------+-----------+----------+--------------+ SFJ      Full                                                        +---------+---------------+---------+-----------+----------+--------------+ FV Prox  Full                                                        +---------+---------------+---------+-----------+----------+--------------+ FV Mid   Full                                                         +---------+---------------+---------+-----------+----------+--------------+  FV DistalFull                                                        +---------+---------------+---------+-----------+----------+--------------+ PFV      Full                                                        +---------+---------------+---------+-----------+----------+--------------+ POP      Full           Yes      Yes                                 +---------+---------------+---------+-----------+----------+--------------+ PTV      Full                                                        +---------+---------------+---------+-----------+----------+--------------+ PERO     Full                                                        +---------+---------------+---------+-----------+----------+--------------+     Summary: BILATERAL: - No evidence of deep vein thrombosis seen in the lower extremities, bilaterally. -No evidence of popliteal cyst, bilaterally.   *See table(s) above for measurements and observations. Electronically signed by Coral Else MD on 02/01/2023 at 12:13:11 AM.    Final    DG Chest Portable 1 View Result Date: 01/30/2023 CLINICAL DATA:  Shortness of breath EXAM: PORTABLE CHEST 1 VIEW COMPARISON:  01/29/2023 FINDINGS: Heart and mediastinal contours are within normal limits. No focal opacities or effusions. No acute bony abnormality. IMPRESSION: No active cardiopulmonary disease. Electronically Signed   By: Charlett Nose M.D.   On: 01/30/2023 22:30   CT Angio Chest PE W and/or Wo Contrast Result Date: 01/29/2023 CLINICAL DATA:  PE suspected, shortness of breath for 2 days, left chest pain EXAM: CT ANGIOGRAPHY CHEST WITH CONTRAST TECHNIQUE: Multidetector CT imaging of the chest was performed using the standard protocol during bolus administration of intravenous contrast. Multiplanar CT image reconstructions and MIPs were obtained to evaluate the vascular anatomy. RADIATION  DOSE REDUCTION: This exam was performed according to the departmental dose-optimization program which includes automated exposure control, adjustment of the mA and/or kV according to patient size and/or use of iterative reconstruction technique. CONTRAST:  75mL OMNIPAQUE IOHEXOL 350 MG/ML SOLN COMPARISON:  07/28/2022 FINDINGS: Cardiovascular: Satisfactory opacification of the pulmonary arteries to the segmental level. Positive examination for pulmonary embolism with segmental to subsegmental embolus in the left lower lobe (series 5, image 150). No other embolus identified. Mild global cardiomegaly. RV LV ratio is preserved at 0.8. Coronary artery calcifications. No pericardial effusion. Aortic atherosclerosis. Mediastinum/Nodes: No enlarged mediastinal, hilar, or axillary lymph nodes. Thyroid gland, trachea, and esophagus demonstrate no significant findings. Lungs/Pleura: Bibasilar partial atelectasis  and mosaic attenuation of the airspaces (series 6, image 88). Scattered ground-glass airspace opacity in the inferior right upper lobe (series 6, image 59). No pleural effusion or pneumothorax. Upper Abdomen: No acute abnormality. Musculoskeletal: No chest wall abnormality. No acute osseous findings. Review of the MIP images confirms the above findings. IMPRESSION: 1. Positive examination for pulmonary embolism with segmental to subsegmental embolus in the left lower lobe. No other embolus identified. 2. Scattered ground-glass airspace opacity in the inferior right upper lobe, nonspecific and infectious or inflammatory. 3. Mild global cardiomegaly and coronary artery disease. No specific imaging evidence of right heart strain, with RV LV ratio at 0.8 and normal caliber of the main pulmonary artery. Call report request was placed at the time of interpretation. Report issued at this time in the interest of expediency. Final communication of critical findings will be documented. Aortic Atherosclerosis (ICD10-I70.0).  Electronically Signed   By: Jearld Lesch M.D.   On: 01/29/2023 09:22   DG Chest 2 View Result Date: 01/29/2023 CLINICAL DATA:  Lower chest wall pain, shortness of breath EXAM: CHEST - 2 VIEW COMPARISON:  11/12/2022 FINDINGS: The heart size and mediastinal contours are within normal limits. Both lungs are clear. The visualized skeletal structures are unremarkable. IMPRESSION: No acute abnormality of the lungs. Electronically Signed   By: Jearld Lesch M.D.   On: 01/29/2023 09:10    Microbiology: Results for orders placed or performed during the hospital encounter of 01/29/23  Resp panel by RT-PCR (RSV, Flu A&B, Covid) Anterior Nasal Swab     Status: None   Collection Time: 01/29/23  7:47 AM   Specimen: Anterior Nasal Swab  Result Value Ref Range Status   SARS Coronavirus 2 by RT PCR NEGATIVE NEGATIVE Final    Comment: (NOTE) SARS-CoV-2 target nucleic acids are NOT DETECTED.  The SARS-CoV-2 RNA is generally detectable in upper respiratory specimens during the acute phase of infection. The lowest concentration of SARS-CoV-2 viral copies this assay can detect is 138 copies/mL. A negative result does not preclude SARS-Cov-2 infection and should not be used as the sole basis for treatment or other patient management decisions. A negative result may occur with  improper specimen collection/handling, submission of specimen other than nasopharyngeal swab, presence of viral mutation(s) within the areas targeted by this assay, and inadequate number of viral copies(<138 copies/mL). A negative result must be combined with clinical observations, patient history, and epidemiological information. The expected result is Negative.  Fact Sheet for Patients:  BloggerCourse.com  Fact Sheet for Healthcare Providers:  SeriousBroker.it  This test is no t yet approved or cleared by the [place] FDA and  has been authorized for detection and/or  diagnosis of SARS-CoV-2 by FDA under an Emergency Use Authorization (EUA). This EUA will remain  in effect (meaning this test can be used) for the duration of the COVID-19 declaration under Section 564(b)(1) of the Act, 21 U.S.C.section 360bbb-3(b)(1), unless the authorization is terminated  or revoked sooner.       Influenza A by PCR NEGATIVE NEGATIVE Final   Influenza B by PCR NEGATIVE NEGATIVE Final    Comment: (NOTE) The Xpert Xpress SARS-CoV-2/FLU/RSV plus assay is intended as an aid in the diagnosis of influenza from Nasopharyngeal swab specimens and should not be used as a sole basis for treatment. Nasal washings and aspirates are unacceptable for Xpert Xpress SARS-CoV-2/FLU/RSV testing.  Fact Sheet for Patients: BloggerCourse.com  Fact Sheet for Healthcare Providers: SeriousBroker.it  This test is not yet approved or cleared by  the [employer] and has been authorized for detection and/or diagnosis of SARS-CoV-2 by FDA under an Emergency Use Authorization (EUA). This EUA will remain in effect (meaning this test can be used) for the duration of the COVID-19 declaration under Section 564(b)(1) of the Act, 21 U.S.C. section 360bbb-3(b)(1), unless the authorization is terminated or revoked.     Resp Syncytial Virus by PCR NEGATIVE NEGATIVE Final    Comment: (NOTE) Fact Sheet for Patients: BloggerCourse.com  Fact Sheet for Healthcare Providers: SeriousBroker.it  This test is not yet approved or cleared by the [place] FDA and has been authorized for detection and/or diagnosis of SARS-CoV-2 by FDA under an Emergency Use Authorization (EUA). This EUA will remain in effect (meaning this test can be used) for the duration of the COVID-19 declaration under Section 564(b)(1) of the Act, 21 U.S.C. section 360bbb-3(b)(1), unless the authorization is terminated  or revoked.  Performed at [facility], [address] [place], [place] [address]     Labs: CBC: Recent Labs  Lab 01/29/23 [id number] 01/30/23 2055 01/31/23 0520 02/01/23 0306 02/02/23 0256  WBC 9.2 8.5 7.9 7.4 8.7  NEUTROABS 5.5 4.8  --   --   --   HGB 11.6* 12.4 11.2* 10.7* 10.9*  HCT 36.5 37.6 33.9* 33.0* 34.2*  MCV 90.1 86.6 86.7 87.5 88.4  PLT 477* 565* 513* 510* 496*   Basic Metabolic Panel: Recent Labs  Lab 01/29/23 0747 01/30/23 2055 01/31/23 0728 02/01/23 0306 02/02/23 0256  NA 142 136 139 139 136  K 3.9 4.2 3.5 3.6 3.7  CL 106 98 101 103 100  CO2 28 28 28 29 27   GLUCOSE 91 135* 115* 71 132*  BUN 17 9 10 12 16   CREATININE 1.27* 1.01* 1.13* 1.18* 1.24*  CALCIUM 9.1 9.7 8.9 8.6* 8.5*  MG  --   --  1.5* 2.2 1.9   Liver Function Tests: Recent Labs  Lab 01/29/23 0747 01/31/23 0728  AST 14* 15  ALT 17 16  ALKPHOS 94 91  BILITOT 0.4 0.6  PROT 7.2 7.0  ALBUMIN 3.5 2.7*   CBG: Recent Labs  Lab 02/01/23 0703 02/01/23 1250 02/01/23 1618 02/01/23 2136 02/02/23 0629  GLUCAP 79 95 248* 183* 139*    Discharge time spent: approximately 35 minutes spent on discharge counseling, evaluation of patient on day of discharge, and coordination of discharge planning with nursing, social work, pharmacy and case management  Signed: Alberteen Sam, MD [facility] 02/02/2023

## 2023-02-02 NOTE — Progress Notes (Signed)
PHARMACY - ANTICOAGULATION CONSULT NOTE  Pharmacy Consult for heparin Indication: pulmonary embolus  Labs: Recent Labs    01/30/23 2055 01/31/23 0520 01/31/23 0728 01/31/23 1249 02/01/23 0306 02/01/23 1040 02/01/23 1953 02/02/23 0256  HGB 12.4 11.2*  --   --  10.7*  --   --  10.9*  HCT 37.6 33.9*  --   --  33.0*  --   --  34.2*  PLT 565* 513*  --   --  510*  --   --  496*  APTT 39* 62*  --    < > 100* 133* 50* 93*  HEPARINUNFRC  --  >1.10*  --   --  >1.10*  --   --  >1.10*  CREATININE 1.01*  --  1.13*  --  1.18*  --   --  1.24*  TROPONINIHS 4  --   --   --   --   --   --   --    < > = values in this interval not displayed.   Assessment/Plan:  57yo female therapeutic on heparin after rate chang. Will continue infusion at current rate of 1250 units/hr and confirm stable with additional level if plan to transition to DOAC postponed.  Vernard Gambles, PharmD, BCPS 02/02/2023 4:02 AM

## 2023-02-02 NOTE — Progress Notes (Signed)
Patient given discharge instructions. PIVs removed. Work note given to patient. Telemetry removed, CCMD notified. Patient taken to vehicle in wheelchair by staff.  Kenard Gower, RN

## 2023-02-02 NOTE — Progress Notes (Signed)
    02/02/23 0602  Provider Notification  Provider Name/Title Dr. Margo Aye  Date Provider Notified 02/02/23  Time Provider Notified 0602  Method of Notification Page  Notification Reason Requested by patient for extra dose of Dilaudid to increase the frequency due to severe left lower chest and left flank pain. After she came back from CT abdominal and pelvic.   MD aware that the result is negative acute findings noted in the abdomen or pelvis to account for the patient's symptoms. Specifically, no urinary tract calculi no findings of urinary tract obstruction are noted.    Provider response See new orders for pain medication adjustment.   Date of Provider Response 02/02/23  Time of Provider Response 0602    Filiberto Pinks, RN

## 2023-02-02 NOTE — Plan of Care (Signed)
Pt's plan of care was is reviewed. Pt has been progressing. She is stable hemodynamically, afebrile, normal respiratory efforts, no acute distress noted. Pain 7-8/10 pain assessment score on left lower posterior chest is tolerated well after Dilaudid and Oxycodone PRN given. She is able to rest well. We will monitor.  Problem: Clinical Measurements: Goal: Ability to maintain clinical measurements within normal limits will improve Outcome: Progressing Goal: Will remain free from infection Outcome: Progressing Goal: Diagnostic test results will improve Outcome: Progressing Goal: Respiratory complications will improve Outcome: Progressing Goal: Cardiovascular complication will be avoided Outcome: Progressing   Problem: Activity: Goal: Risk for activity intolerance will decrease Outcome: Progressing   Problem: Pain Management: Goal: General experience of comfort will improve Outcome: Progressing   Problem: Nutritional: Goal: Maintenance of adequate nutrition will improve Outcome: Progressing Goal: Progress toward achieving an optimal weight will improve Outcome: Progressing    Filiberto Pinks, RN

## 2023-02-02 NOTE — Progress Notes (Signed)
Received a call from bedside RN regarding the patient having severe left flank pain.    Presented at bedside.  The patient was significantly tender on exam with mild palpation of her left flank.  Admission UA was negative for pyuria.    Due to concern for possible hematoma while on heparin drip a noncontrast CT abdomen pelvis was obtained and was unrevealing.    Patient continues to complain of severe abdominal pain requiring multiple rounds of analgesics including IV Dilaudid.  Time: 15 minutes.

## 2023-02-02 NOTE — Discharge Instructions (Signed)
Information on my medicine - ELIQUIS (apixaban)  Why was Eliquis prescribed for you? Eliquis was prescribed to treat blood clots that may have been found in the veins of your legs (deep vein thrombosis) or in your lungs (pulmonary embolism) and to reduce the risk of them occurring again.  What do You need to know about Eliquis ? The starting dose is 10 mg (two 5 mg tablets) taken TWICE daily for the FIRST SEVEN (7) DAYS, then on 02/09/23  the dose is reduced to ONE 5 mg tablet taken TWICE daily.  Eliquis may be taken with or without food.   Try to take the dose about the same time in the morning and in the evening. If you have difficulty swallowing the tablet whole please discuss with your pharmacist how to take the medication safely.  Take Eliquis exactly as prescribed and DO NOT stop taking Eliquis without talking to the doctor who prescribed the medication.  Stopping may increase your risk of developing a new blood clot.  Refill your prescription before you run out.  After discharge, you should have regular check-up appointments with your healthcare provider that is prescribing your Eliquis.    What do you do if you miss a dose? If a dose of ELIQUIS is not taken at the scheduled time, take it as soon as possible on the same day and twice-daily administration should be resumed. The dose should not be doubled to make up for a missed dose.  Important Safety Information A possible side effect of Eliquis is bleeding. You should call your healthcare provider right away if you experience any of the following: Bleeding from an injury or your nose that does not stop. Unusual colored urine (red or dark brown) or unusual colored stools (red or black). Unusual bruising for unknown reasons. A serious fall or if you hit your head (even if there is no bleeding).  Some medicines may interact with Eliquis and might increase your risk of bleeding or clotting while on Eliquis. To help avoid this,  consult your healthcare provider or pharmacist prior to using any new prescription or non-prescription medications, including herbals, vitamins, non-steroidal anti-inflammatory drugs (NSAIDs) and supplements.  This website has more information on Eliquis (apixaban): http://www.eliquis.com/eliquis/home

## 2023-02-02 NOTE — Telephone Encounter (Signed)
Dr. Leonides Schanz,  This pt is scheduled with you on Dec 30 for an elective screening colonoscopy.  She presented to the ER yesterday and was dx'd with a subsegmental pulmonary embolism.  It would be prudent to delay her LEC procedure until her pulmonary condition has fully resolved.  Thanks,  Cathlyn Parsons

## 2023-02-03 ENCOUNTER — Telehealth: Payer: Self-pay | Admitting: *Deleted

## 2023-02-03 ENCOUNTER — Other Ambulatory Visit (HOSPITAL_BASED_OUTPATIENT_CLINIC_OR_DEPARTMENT_OTHER): Payer: Self-pay

## 2023-02-03 ENCOUNTER — Ambulatory Visit (INDEPENDENT_AMBULATORY_CARE_PROVIDER_SITE_OTHER): Payer: Commercial Managed Care - HMO | Admitting: Medical

## 2023-02-03 VITALS — BP 152/92 | HR 57 | Temp 97.6°F | Resp 18 | Ht 72.0 in | Wt 175.0 lb

## 2023-02-03 DIAGNOSIS — I2699 Other pulmonary embolism without acute cor pulmonale: Secondary | ICD-10-CM | POA: Diagnosis not present

## 2023-02-03 DIAGNOSIS — Z0184 Encounter for antibody response examination: Secondary | ICD-10-CM

## 2023-02-03 DIAGNOSIS — Z8541 Personal history of malignant neoplasm of cervix uteri: Secondary | ICD-10-CM | POA: Diagnosis not present

## 2023-02-03 DIAGNOSIS — Z111 Encounter for screening for respiratory tuberculosis: Secondary | ICD-10-CM

## 2023-02-03 DIAGNOSIS — Z1231 Encounter for screening mammogram for malignant neoplasm of breast: Secondary | ICD-10-CM | POA: Diagnosis not present

## 2023-02-03 DIAGNOSIS — K529 Noninfective gastroenteritis and colitis, unspecified: Secondary | ICD-10-CM | POA: Diagnosis not present

## 2023-02-03 DIAGNOSIS — D649 Anemia, unspecified: Secondary | ICD-10-CM

## 2023-02-03 MED ORDER — APIXABAN 5 MG PO TABS
5.0000 mg | ORAL_TABLET | Freq: Two times a day (BID) | ORAL | 4 refills | Status: DC
  Filled 2023-02-03 – 2023-04-06 (×2): qty 60, 30d supply, fill #0

## 2023-02-03 MED ORDER — OXYCODONE HCL 10 MG PO TABS
10.0000 mg | ORAL_TABLET | Freq: Three times a day (TID) | ORAL | 0 refills | Status: DC | PRN
  Filled 2023-02-03 – 2023-02-06 (×3): qty 90, 30d supply, fill #0
  Filled ????-??-??: fill #0

## 2023-02-03 NOTE — Progress Notes (Signed)
Subjective:    Patient ID: [name], female    DOB: 20-Apr-1965, 57 y.o.   MRN: [id number]  HPI  Pt in for follow up from hospital admission.  DOA: 01/30/2023 DOS: the patient was seen and examined on 01/31/2023 PCP: Esperanza Richters, PA-C  Patient coming from: Transfer from [facility]    " Chief Complaint:     Chief Complaint  Patient presents with   pulmonary embolism   Shortness of Breath   HPI: [name] is a 57 y.o. female with medical history significant of hypertension, hyperlipidemia, coronary artery disease, cardiomyopathy, diabetes mellitus, CKD stage IIIa, chronic back pain, peptic ulcer disease, and anxiety presented with worsening shortness of breath and severe pain that started a 1-2 days prior to her initial visit on the ED on the 19th.  She had been having left posterior flank pain as well as shortness of breath, but had denied any heavy lifting or injury to onset symptoms.  Noted associated symptoms of a cough and inability to take deep breath.  Labs revealed elevated D-dimer at 1.3. CT angiogram of the chest obtained as initial chest x-ray noted no acute abnormality which revealed pulmonary embolism with segmental to subsegmental embolus in the left lower lobe without signs of right heart strain, groundglass opacities in the right upper, mild global cardiomegaly and coronary artery disease. She had been prescribed Eliquis, doxycycline, and an oxycodone.  However, the patient reported worsening flank pain and increased shortness of breath despite taking medications for which she came back to the hospital.    The patient recently started a new job in the last month that involves prolonged periods of sitting at a desk.   The patient's diabetes is managed with insulin and metformin, and she also take Ozempic once a week. The patient's hypertension is managed with amlodipine and Coreg. The patient also takes atorvastatin, Buspar as needed for anxiety, and  oxycodone as needed for back pain related to previous strenuous work. The patient uses an albuterol inhaler and Breo for asthma.   The patient was started on Eliquis during the initial hospital visit for suspected blood clots. The worsening symptoms upon return to the hospital suggest possible residual clot in the leg that may have broken off. The patient also reported feeling sick to the stomach, which they attribute to the pain medication and overall feeling of illness. The patient denied any leg swelling or calf pain.   In the emergency department patient was noted to be afebrile with heart rates elevated up to 115, blood pressures 113/91 to 165/110, and O2 saturations as low as 85% on room air with improvement on 2 L of nasal cannula oxygen to at least 90%.  Labs since yesterday noted hemoglobin 12.4-> 11.2 and platelets 565-> 513.  Chest x-ray noted no acute abnormality.  Patient had been started on a heparin drip.    Data Reviewed:   EKG reveals sinus tachycardia at 112 bpm.  Reviewed labs, imaging, and pertinent records as documented.   Assessment and Plan:   Acute respiratory failure with hypoxia secondary to pulmonary embolism Patient diagnosed with pulmonary embolism on 12/19 and started on Eliquis.  Presented back to the hospital yesterday due to worsening left flank pain and shortness of breath.  Noted to be hypoxic with O2 saturation dropping as low as 85% for which patient had been placed on 2 L nasal cannula oxygen temporarily.  Due to worsening symptoms she was started on a heparin drip and admission  requested.  She had been prescribed doxycycline as well as there was concern for groundglass opacities of the right upper lobe for infection. -Admit to a telemetry bed -Continuous pulse oximetry with oxygen maintain O2 saturations -Incentive spirometry and flutter valve -Continue heparin per pharmacy -Check procalcitonin.  Patient is afebrile with no significant white blood cell  count.  Plan to continue doxycycline if procalcitonin elevated.  -Check Doppler ultrasound of the lower extremities and echocardiogram -Check pulse oximetry with ambulation once Doppler ultrasound verifies no signs of DVT -Oxycodone/Dilaudid IV as needed for pain -Plan to transition back to Eliquis as long as no significant abnormalities noted echocardiogram   Normocytic anemia Hemoglobin 11.2 which appears near patient baseline of 11 to 13 g/dL. -Continue to monitor   Thrombocytosis Acute.  Platelet count 565-> 513.  Thought likely to be reactive in nature. -Continue to monitor   Coronary artery disease Cardiomegaly Patient with prior negative heart study with mildly reduced ejection fraction back in 02/2019.  Last echocardiogram from 04/2021 noted EF to be 60 to 65% with normal diastolic parameters.  She had been on aspirin and statin. -Held aspirin due to anticoagulation -Continue statin -Follow-up echocardiogram -Encouraged patient to keep follow-up appointment scheduled with cardiology on February 14   Diabetes mellitus type 2, with long-term use of insulin Last hemoglobin A1c noted to be 7.4 when checked 12/2022.  Patient is on Lantus 40 mg nightly which she did not take yesterday evening, Ozempic, and metformin. -Hypoglycemic protocols -Hold metformin -Substituted Semglee at reduced dose while in the hospital -CBGs before every meal with sensitive SSI -Adjust regimen as needed   Hypertensive urgency Blood pressures noted to be elevated up to 165/110.  Possibly related uncontrolled pain. -Continue amlodipine and Coreg -Hydralazine IV as needed for elevated blood pressure   Hyperlipidemia Managed with atorvastatin. -Continue current regimen.   Chronic kidney disease stage IIIa Creatinine appears near baseline. -Continue to monitor   Chronic back pain Managed with oxycodone as needed. -Continue current regimen.   Asthma without acute exacerbation Managed with  albuterol and Breo. -Continue current regimen.   Hypomagnesemia Magnesium noted to be 1.4 -Order magnesium replacement.   DVT prophylaxis: Heparin Advance Care Planning:   Code Status: Full Code    Consults: None   Family Communication: Family updated over the phone   Severity of Illness: The appropriate patient status for this patient is INPATIENT. Inpatient status is judged to be reasonable and necessary in order to provide the required intensity of service to ensure the patient's safety. The patient's presenting symptoms, physical exam findings, and initial radiographic and laboratory data in the context of their chronic comorbidities is felt to place them at high risk for further clinical deterioration. Furthermore, it is not anticipated that the patient will be medically stable for discharge from the hospital within 2 midnights of admission. "     Discharge date: 02/02/23  Discharge Physician: Alberteen Sam    PCP: Esperanza Richters, PA-C        Recommendations at discharge:  Follow up with PCP Esperanza Richters PA-C in 1 week for new PE Follow up with Cardiology Dr. Tomie China in 2 months for HFmrEF Ghadeer Kastelic: Please continue Eliquis 6 months and then either stop or continue based on shared decision making for this first time unprovoked VTE Please repeat echo in 4 months (see below), if not completed by Dr. Tomie China Please ensure all age-appropriate cancer screenings have been completed given unprovoked VTE   Discharge Diagnoses: Principal Problem:  Acute pulmonary embolism (HCC) Active Problems:   Normocytic anemia   Coronary artery disease Myopathy   DM2 (diabetes mellitus, type 2) (HCC)   HLD (hyperlipidemia)   Chronic kidney disease, stage IIIa (moderate) (HCC)   Asthma without acute exacerbation   Hypomagnesemia  Hospital Course: 57 y.o. F with DM, HTN, CAD, CKD IIIa and chronic back pain who returned to the ER with chest discomfort and shortness of  breath.   Patient developed pleuritic pain and shortness of breath several days prior to admission.  After 2 days, was seen in the ER, CT angiogram showed segmental PE, she was started on Eliquis and discharged.   However she had continued shortness of breath, so she returned to the hospital.  On return, she was hypoxic to 85%, placed on oxygen and admitted for IV heparin.  Acute pulmonary embolism Patient had vascular duplex ultrasounds that were negative for DVT bilaterally.  She had an echocardiogram that showed no right heart failure.  She was hemodynamically stable, and quickly weaned off of oxygen.  Transient hypoxia, respiratory failure ruled out.  She reported flank pain, and a CT abdomen ruled out RP bleeding.    She was transitioned back to Eliquis and discharged home.   This is a first-time unprovoked PE.  Recommend minimum 6 months of anticoagulation, followed by prolonged anticoagulation given individual risk factors and patient risk preferences.       Cardiomyopathy Patient's EF was previously 55%.  On repeat here, the RH function was normal, but LVEF was 50% without RWMA.  Troponins were normal, ACS felt very unlikely.  This was discussed with Cardiology who recommended repeat echo in 4-6 months.  Patient has follow up with her Cardiologist in 2 months.   From Dr. Maryfrances Bunnell: You were admitted for shortness of breath from a blood clot. You were treated here with heparin.     You should resume your Eliquis/apixaban   Take apixaban/Eliquis 10 mg (two tabs) twice daily for 7 days After 7 days, reduce to apixaban/Eliquis 5 mg (one tab) twice daily   Take that for 6 months, then discuss with Esperanza Richters whether to continue or not   Make sure all routine health screenings (mammogram and colon cancer screening are up to date)   For the heart: Continue your cholesterol medicine atorvastatin 80 mg nightly STOP aspirin (now that you are on the Eliquis)   Follow up with Dr.  Tomie China in 2 months in Feb (see below)   Have him obtain an echocardiogram in 4 months to re-evaluate your ejection fraction    Increase activity slowly       Review of Systems  Constitutional:  Negative for chills, fatigue and fever.  Respiratory:  Negative for cough, chest tightness and wheezing.   Cardiovascular:  Negative for chest pain and palpitations.  Gastrointestinal:  Negative for abdominal pain, constipation and nausea.  Genitourinary:  Negative for dysuria and enuresis.  Musculoskeletal:  Positive for back pain. Negative for neck pain.       Left rib area pain/pleuritic pain described.  Skin:  Negative for rash.  Neurological:  Negative for dizziness, seizures, weakness and light-headedness.  Hematological:  Negative for adenopathy. Does not bruise/bleed easily.  Psychiatric/Behavioral:  Negative for behavioral problems and decreased concentration.     Past Medical History:  Diagnosis Date   Abnormal Pap smear of cervix    Anxiety 01/22/2016   Asthma    Cardiac murmur 02/01/2019   Cardiomyopathy, secondary (HCC) 09/06/2009   Qualifier:  Diagnosis of  By: Jolene Provost   Formatting of this note might be different from the original. Overview:  Qualifier: Diagnosis of  By: Jolene Provost   Cataract    Cervical cancer [facility]) 2001   Radical hysterectomy in [facility]   Chronic kidney disease, stage III (moderate) (HCC) 05/23/2014   Chronic pain syndrome 01/23/2016   Coronary artery disease    30% lesions noted   Depression    Diabetes type 2, uncontrolled 05/23/2014   Diabetic gastroparesis associated with type 1 diabetes mellitus (HCC) 11/27/2016   DIABETIC PERIPHERAL NEUROPATHY 09/21/2009   Qualifier: Diagnosis of  By: Delrae Alfred MD, [name]     Essential hypertension 08/30/2009   Qualifier: Diagnosis of  By: Cristela Felt, CNA, Christy     Gastroesophageal reflux disease 07/30/2009   Formatting of this note might be different from the  original. Overview:  Overview:  Qualifier: Diagnosis of  By: Delrae Alfred MD, Alfonso Patten: Diagnosis of  By: Wilmon Pali NP, Consuela Mimes of this note might be different from the original. Overview:  Qualifier: Diagnosis of  By: Delrae Alfred MD, Alfonso Patten: Diagnosis of  By: Wilmon Pali NP, Gunnar Fusi   GERD (gastroesophageal reflux disease)    History of cervical cancer 08/17/2015   Status post partial hysterectomy  Formatting of this note might be different from the original. Status post partial hysterectomy   Hyperlipidemia    INSOMNIA 09/21/2009   Qualifier: Diagnosis of  By: Delrae Alfred MD, [name]     Lumbar facet arthropathy 05/14/2016   Lumbar spinal stenosis 02/11/2018   Metatarsalgia of both feet 03/11/2017   Migraine 09/13/2012   Overview:  IMPRESSION: possible abd migraine   TOBACCO ABUSE 09/21/2009   Qualifier: Diagnosis of  By: Delrae Alfred MD, Beverely Low of this note might be different from the original. 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  Tobacco Use   Smoking status: Former    Current packs/day: 0.00    Types: Cigarettes    Quit date: 1990    Years since quitting: 35.0   Smokeless tobacco: Never  Vaping Use   Vaping status: Never Used  Substance and Sexual Activity   Alcohol use: Yes    Comment: occasionally   Drug use: No   Sexual activity: Yes    Partners: Male    Birth control/protection: Surgical    Comment: hysterectomy  Other  Topics Concern   Not on file  Social History Narrative   Not on file   Social Drivers of Health   Financial Resource Strain: Not on file  Food Insecurity: Food Insecurity Present (01/31/2023)   Hunger Vital Sign    Worried About Running Out of Food in the Last Year: Sometimes true    Ran Out of Food in the Last Year: Sometimes true  Transportation Needs: No Transportation Needs (01/31/2023)   PRAPARE - Transportation  Lack of Transportation (Medical): No    Lack of Transportation (Non-Medical): No  Physical Activity: Not on file  Stress: Not on file  Social Connections: Unknown (06/23/2021)   [facility] from [facility]   Social Network    Social Network: Not on file  Intimate Partner Violence: Not At Risk (01/31/2023)   Humiliation, Afraid, Rape, and Kick questionnaire    Fear of Current or Ex-Partner: No    Emotionally Abused: No    Physically Abused: No    Sexually Abused: No    Past Surgical History:  Procedure Laterality Date   ABDOMINAL HYSTERECTOMY     radical hysterectomy   AMPUTATION Left 01/06/2022   Procedure: AMPUTATION 2nd TOE;  Surgeon: Nadara Mustard, MD;  Location: [facility] OR;  Service: Orthopedics;  Laterality: Left;   BIOPSY  11/06/2022   Procedure: BIOPSY;  Surgeon: Imogene Burn, MD;  Location: [facility] ENDOSCOPY;  Service: Gastroenterology;;   CHOLECYSTECTOMY     ESOPHAGOGASTRODUODENOSCOPY (EGD) WITH PROPOFOL N/A 11/06/2022   Procedure: ESOPHAGOGASTRODUODENOSCOPY (EGD) WITH PROPOFOL;  Surgeon: Imogene Burn, MD;  Location: [facility] ENDOSCOPY;  Service: Gastroenterology;  Laterality: N/A;   I & D EXTREMITY Left 04/24/2021   Procedure: LEFT LEG DEBRIDEMENT;  Surgeon: Nadara Mustard, MD;  Location: [facility] OR;  Service: Orthopedics;  Laterality: Left;   I & D EXTREMITY Left 05/01/2021   Procedure: LEFT KNEE DEBRIDEMENT;  Surgeon: Nadara Mustard, MD;  Location: [facility] OR;  Service: Orthopedics;  Laterality: Left;    Family History  Problem Relation Age of  Onset   Breast cancer Mother    Stroke Mother    Hypertension Mother    Diabetes Mother    Hypertension Father    Diabetes Father    Heart attack Father    Hypertension Sister    Diabetes Sister    Hypertension Brother    Diabetes Brother    Hypertension Brother    Diabetes Brother    Stroke Maternal Grandmother    Colon cancer Neg Hx    Colon polyps Neg Hx    Esophageal cancer Neg Hx    Stomach cancer Neg Hx    Rectal cancer Neg Hx     Allergies  Allergen Reactions   Lisinopril Swelling    Angioedema   Oxycodone-Acetaminophen Nausea Only   Latex Itching, Swelling and Rash   Morphine Itching and Rash    Current Outpatient Medications on File Prior to Visit  Medication Sig Dispense Refill   albuterol (VENTOLIN HFA) 108 (90 Base) MCG/ACT inhaler Inhale 2 puffs into the lungs every 6 (six) hours as needed for wheezing or shortness of breath. 18 g 2   amLODipine (NORVASC) 5 MG tablet Take 1 tablet (5 mg total) by mouth daily. 90 tablet 3   Apixaban Starter Pack, 10mg  and 5mg , (ELIQUIS DVT/PE STARTER PACK) Take 2 tablets (10mg ) by mouth twice daily for 7 days, then 1 tablet (5mg ) twice daily 74 tablet 0   atorvastatin (LIPITOR) 80 MG tablet Take 1 tablet (80 mg total) by mouth daily. 90 tablet 3   baclofen (LIORESAL) 10 MG tablet Take 1 tablet (10 mg total) by mouth 3 (three) times daily. (Patient taking differently: Take 10 mg by mouth 3 (three) times daily as needed for muscle spasms.) 30 each 0   busPIRone (BUSPAR) 15 MG tablet Take 1 tablet (15 mg total) by mouth 2 (two) times daily. (Patient taking differently: Take 15 mg by mouth daily as needed (anxiety).) 60 tablet 4  carvedilol (COREG) 25 MG tablet Take 1 tablet (25 mg total) by mouth 2 (two) times daily with a meal. 180 tablet 0   Continuous Glucose Sensor (FREESTYLE LIBRE 3 SENSOR) MISC USE AS DIRECTED TO MONITOR BLOOD SUGAR. CHANGE EVERY 14 DAYS     fluticasone furoate-vilanterol (BREO ELLIPTA) 100-25 MCG/INH AEPB  Inhale 1 puff into the lungs daily. (Patient taking differently: Inhale 1 puff into the lungs daily as needed (Asthma).) 100 each 2   gabapentin (NEURONTIN) 300 MG capsule TAKE 2 CAPSULES BY MOUTH THREE TIMES DAILY 270 capsule 0   Insulin Glargine (BASAGLAR KWIKPEN) 100 UNIT/ML INJECT 40 UNITS SUBCUTANEOUSLY AT BEDTIME 12 mL 0   Insulin Pen Needle (PEN NEEDLES 31GX5/16") 31G X 8 MM MISC Use daily as directed 100 each 0   metFORMIN (GLUCOPHAGE-XR) 500 MG 24 hr tablet Take 1 tablet (500 mg total) by mouth daily with breakfast. 60 tablet 2   nitroGLYCERIN (NITROSTAT) 0.4 MG SL tablet Place 1 tablet (0.4 mg total) under the tongue every 5 (five) minutes as needed for chest pain. 30 tablet 3   Oxycodone HCl 10 MG TABS Take 1 tablet (10 mg total) by mouth every 6 (six) hours as needed. 20 tablet 0   OZEMPIC, 0.25 OR 0.5 MG/DOSE, 2 MG/3ML SOPN Inject 0.5 mg into the skin once a week. mondays     pantoprazole (PROTONIX) 40 MG tablet Take 1 tablet (40 mg total) by mouth 2 (two) times daily. 60 tablet 0   promethazine (PHENERGAN) 12.5 MG tablet Take 1 tablet (12.5 mg total) by mouth every 8 (eight) hours as needed for nausea or vomiting. 20 tablet 0   topiramate (TOPAMAX) 50 MG tablet Take 1 tablet (50 mg total) by mouth 2 (two) times daily. (Patient taking differently: Take 50 mg by mouth 2 (two) times daily as needed (headache).) 60 tablet 12   VITAMIN D PO Take 5,000 Units by mouth daily.     No current facility-administered medications on file prior to visit.    BP (!) 152/92   Pulse (!) 57   Temp 97.6 F (36.4 C)   Resp 18   Ht 6' (1.829 m)   Wt 175 lb (79.4 kg)   SpO2 100%   BMI 23.73 kg/m        Objective:   Physical Exam   General Mental Status- Alert. General Appearance- Not in acute distress.   Skin General: Color- Normal Color. Moisture- Normal Moisture.  Neck Carotid Arteries- Normal color. Moisture- Normal Moisture. No carotid bruits. No JVD.  Chest and Lung  Exam Auscultation: Breath Sounds:-CTA  Cardiovascular Auscultation:Rythm- RRR Murmurs & Other Heart Sounds:Auscultation of the heart reveals- No Murmurs.  Abdomen Inspection:-Inspeection Normal. Palpation/Percussion:Note:No mass. Palpation and Percussion of the abdomen reveal- Non Tender, Non Distended + BS, no rebound or guarding.   Neurologic Cranial Nerve exam:- CN III-XII intact(No nystagmus), symmetric smile. Strength:- 5/5 equal and symmetric strength both upper and lower extremities.    Lower ext- calves symmetric, negative homans signs. No edema.    Assessment & Plan:   Patient Instructions  Pulmonary Embolism Recent hospitalization for PE with ongoing pleuritic pain. Currently on Eliquis with a starter pack. Improved breathing since discharge. -Continue Eliquis as prescribed. -Referral to Dr. Wynona Dove, a hematologist, for further evaluation of unprovoked PE. -Write a prescription for an additional month of Eliquis.  Chronic Back and Neck Pain Exacerbation of chronic pain, possibly related to recent PE. -Increase Oxycodone to 10mg  tablets, three times a day. -Consider low  dose Medrol for muscle pain if blood sugars are well controlled.  Hyperglycemia Recent low blood sugars noted during hospitalization. -Check blood sugars three times a day and report readings via MyChart on Thursday.  Cancer Screening History of cervical cancer with partial hysterectomy. Due for mammogram and colonoscopy. -Order screening mammogram. -Encourage patient to reschedule colonoscopy after recovery from PE.  Follow-up in two weeks or sooner if needed.   Time spent with patient today was 55  minutes which consisted of chart revdew, discussing diagnosis, work up, treatment and documentation.

## 2023-02-03 NOTE — Transitions of Care (Post Inpatient/ED Visit) (Signed)
02/03/2023  Name: [name] MRN: [id number] DOB: 25-Dec-1965  Today's TOC FU Call Status: Today's TOC FU Call Status:: Successful TOC FU Call Completed TOC FU Call Complete Date: 02/03/23 Patient's Name and Date of Birth confirmed.  Transition Care Management Follow-up Telephone Call Date of Discharge: 02/02/23 Discharge Facility: [facility]) Type of Discharge: Inpatient Admission Primary Inpatient Discharge Diagnosis:: Acute pulmonary embolism How have you been since you were released from the hospital?: Better Any questions or concerns?: No  Items Reviewed: Did you receive and understand the discharge instructions provided?: Yes Medications obtained,verified, and reconciled?: Yes (Medications Reviewed) Any new allergies since your discharge?: No Dietary orders reviewed?: No Do you have support at home?: Yes People in Home: child(ren), adult Name of Support/Comfort Primary Source: lives with son/ kimberly sister  Medications Reviewed Today: Medications Reviewed Today     Reviewed by Luella Cook, RN (Case Manager) on 02/03/23 at 1404  Med List Status: <None>   Medication Order Taking? Sig Documenting Provider Last Dose Status Informant  albuterol (VENTOLIN HFA) 108 (90 Base) MCG/ACT inhaler [id number] Yes Inhale 2 puffs into the lungs every 6 (six) hours as needed for wheezing or shortness of breath. Saguier, Ramon Dredge, PA-C Taking Active Self, Pharmacy Records  amLODipine (NORVASC) 5 MG tablet [id number] Yes Take 1 tablet (5 mg total) by mouth daily. Saguier, Ramon Dredge, PA-C Taking Active Self, Pharmacy Records  apixaban (ELIQUIS) 5 MG TABS tablet [id number] Yes Take 1 tablet (5 mg total) by mouth 2 (two) times daily. Saguier, Ramon Dredge, PA-C Taking Active   Apixaban Starter Pack, 10mg  and 5mg , (ELIQUIS DVT/PE Liam Graham) [id number] Yes Take 2 tablets (10mg ) by mouth twice daily for 7 days, then 1 tablet (5mg ) twice daily Danford, Earl Lites, MD Taking Active    atorvastatin (LIPITOR) 80 MG tablet [id number] Yes Take 1 tablet (80 mg total) by mouth daily. Saguier, Ramon Dredge, PA-C Taking Active Self, Pharmacy Records  baclofen (LIORESAL) 10 MG tablet [id number] Yes Take 1 tablet (10 mg total) by mouth 3 (three) times daily.  Patient taking differently: Take 10 mg by mouth 3 (three) times daily as needed for muscle spasms.   Saguier, Ramon Dredge, PA-C Taking Active Self, Pharmacy Records  busPIRone (BUSPAR) 15 MG tablet [id number] Yes Take 1 tablet (15 mg total) by mouth 2 (two) times daily.  Patient taking differently: Take 15 mg by mouth daily as needed (anxiety).   Saguier, Ramon Dredge, PA-C Taking Active Self, Pharmacy Records           Med Note Jola Schmidt   VFI Jan 31, 2023 10:42 AM) >30 days.   carvedilol (COREG) 25 MG tablet [id number] Yes Take 1 tablet (25 mg total) by mouth 2 (two) times daily with a meal. Saguier, Ramon Dredge, PA-C Taking Active Self, Pharmacy Records  Continuous Glucose Sensor (FREESTYLE LIBRE 3 SENSOR) [place] [id number] Yes USE AS DIRECTED TO MONITOR BLOOD SUGAR. CHANGE EVERY 14 DAYS [provider] Taking Active Self, Pharmacy Records  fluticasone furoate-vilanterol (BREO ELLIPTA) 100-25 MCG/INH AEPB [id number] Yes Inhale 1 puff into the lungs daily.  Patient taking differently: Inhale 1 puff into the lungs daily as needed (Asthma).   Saguier, Ramon Dredge, PA-C Taking Active Self, Pharmacy Records  gabapentin (NEURONTIN) 300 MG capsule [id number] Yes TAKE 2 CAPSULES BY MOUTH THREE TIMES DAILY Saguier, Ramon Dredge, PA-C Taking Active Self, Pharmacy Records  Insulin Glargine [facility]) 100 UNIT/ML [id number] Yes INJECT 40 UNITS SUBCUTANEOUSLY AT BEDTIME Saguier, Ramon Dredge, PA-C Taking Active Self, Pharmacy Records  Insulin Pen Needle (  PEN NEEDLES 31GX5/16") 31G X 8 MM MISC [id number] Yes Use daily as directed Saguier, Ramon Dredge, PA-C Taking Active Self, Pharmacy Records  metFORMIN (GLUCOPHAGE-XR) 500 MG 24 hr tablet [id number] Yes Take 1 tablet (500  mg total) by mouth daily with breakfast. Saguier, Ramon Dredge, PA-C Taking Active Self, Pharmacy Records  nitroGLYCERIN (NITROSTAT) 0.4 MG SL tablet [id number] Yes Place 1 tablet (0.4 mg total) under the tongue every 5 (five) minutes as needed for chest pain. Saguier, Ramon Dredge, PA-C Taking Active Self, Pharmacy Records           Med Note Jola Schmidt   YQM Jan 31, 2023 10:46 AM) Loreli Slot  Oxycodone HCl 10 MG TABS [id number] Yes Take 1 tablet (10 mg total) by mouth every 6 (six) hours as needed. Alberteen Sam, MD Taking Active   Oxycodone HCl 10 MG TABS [id number] Yes Take 1 tablet (10 mg total) by mouth 3 (three) times daily as needed for severe pain Saguier, Ramon Dredge, PA-C Taking Active   OZEMPIC, 0.25 OR 0.5 MG/DOSE, 2 MG/3ML SOPN [id number] Yes Inject 0.5 mg into the skin once a week. mondays [provider] Taking Active Self, Pharmacy Records  pantoprazole (PROTONIX) 40 MG tablet [id number]  Take 1 tablet (40 mg total) by mouth 2 (two) times daily. Dorcas Carrow, MD  Expired 01/31/23 2359 Self, Pharmacy Records  promethazine (PHENERGAN) 12.5 MG tablet [id number] Yes Take 1 tablet (12.5 mg total) by mouth every 8 (eight) hours as needed for nausea or vomiting. Saguier, Ramon Dredge, PA-C Taking Active Self, Pharmacy Records  topiramate (TOPAMAX) 50 MG tablet [id number] Yes Take 1 tablet (50 mg total) by mouth 2 (two) times daily.  Patient taking differently: Take 50 mg by mouth 2 (two) times daily as needed (headache).   Saguier, Kateri Mc Taking Active Self, Pharmacy Records           Med Note Jola Schmidt   CZY Jan 31, 2023 10:48 AM) >30 days  VITAMIN D PO [id number] Yes Take 5,000 Units by mouth daily. [provider] Taking Active Self, Pharmacy Records           Med Note Jola Schmidt   UXN Jan 31, 2023 10:48 AM) >30 days            Home Care and Equipment/Supplies: Were Home Health Services Ordered?: NA Any new equipment or medical supplies ordered?:  NA  Functional Questionnaire: Do you need assistance with bathing/showering or dressing?: Yes Do you need assistance with meal preparation?: Yes Do you need assistance with eating?: No Do you have difficulty maintaining continence: No Do you need assistance with getting out of bed/getting out of a chair/moving?: No Do you have difficulty managing or taking your medications?: No  Follow up appointments reviewed: PCP Follow-up appointment confirmed?: Yes Date of PCP follow-up appointment?: 02/03/23 Follow-up Provider: [facility] Follow-up appointment confirmed?: NA Do you need transportation to your follow-up appointment?: No Do you understand care options if your condition(s) worsen?: Yes-patient verbalized understanding  SDOH Interventions Today    Flowsheet Row Most Recent Value  SDOH Interventions   Housing Interventions Intervention Not Indicated      Interventions Today    Flowsheet Row Most Recent Value  Chronic Disease   Chronic disease during today's visit Other  [Acute pulmonary embolism]  General Interventions   General Interventions Discussed/Reviewed General Interventions Discussed, General Interventions Reviewed, Doctor Visits  Doctor Visits Discussed/Reviewed Doctor Visits Discussed, Doctor Visits Reviewed  Education Interventions   Education Provided  Provided Education  [following the directions on the eliquis starter pack. Drinking fluids and rest]  Nutrition Interventions   Nutrition Discussed/Reviewed Nutrition Discussed, Nutrition Reviewed, Fluid intake       Patient declined further outreach follow up calls Reiterated what the PCP stated to reschedule colonoscopy after recovery from PE.  Reiterated importance of Eliquis dose pack  Gean Maidens BSN RN Population Health- Transition of Care Team.  [facility] [phone]

## 2023-02-03 NOTE — Patient Instructions (Signed)
Pulmonary Embolism Recent hospitalization for PE with ongoing pleuritic pain. Currently on Eliquis with a starter pack. Improved breathing since discharge. -Continue Eliquis as prescribed. -Referral to Dr. Wynona Dove, a hematologist, for further evaluation of unprovoked PE. -Write a prescription for an additional month of Eliquis.  Chronic Back and Neck Pain Exacerbation of chronic pain, possibly related to recent PE. -Increase Oxycodone to 10mg  tablets, three times a day. -Consider low dose Medrol for muscle pain if blood sugars are well controlled.  Hyperglycemia Recent low blood sugars noted during hospitalization. -Check blood sugars three times a day and report readings via MyChart on Thursday.  Cancer Screening History of cervical cancer with partial hysterectomy. Due for mammogram and colonoscopy. -Order screening mammogram. -Encourage patient to reschedule colonoscopy after recovery from PE.  Follow-up in two weeks or sooner if needed.

## 2023-02-05 ENCOUNTER — Other Ambulatory Visit (HOSPITAL_BASED_OUTPATIENT_CLINIC_OR_DEPARTMENT_OTHER): Payer: Self-pay

## 2023-02-05 NOTE — Telephone Encounter (Signed)
Colonoscopy canceled. Called the patient to explain. No answer. Left a message asking she return my call.

## 2023-02-06 ENCOUNTER — Other Ambulatory Visit (HOSPITAL_BASED_OUTPATIENT_CLINIC_OR_DEPARTMENT_OTHER): Payer: Self-pay

## 2023-02-06 NOTE — Telephone Encounter (Signed)
Spoke with the patient. Aware of the need to postpone the colonoscopy. She also understands she will be contacted in 6 months to schedule an office visit before scheduling a colonoscopy.

## 2023-02-06 NOTE — Telephone Encounter (Signed)
Called the patient. No answer. Left her a message on her voicemail. Advised colonoscopy was canceled. Asked for a return call to acknowledge.

## 2023-02-09 ENCOUNTER — Emergency Department (HOSPITAL_COMMUNITY)
Admission: EM | Admit: 2023-02-09 | Discharge: 2023-02-09 | Disposition: A | Discharge status: Home / Self Care | Payer: Commercial Managed Care - HMO | Attending: Emergency Medicine | Admitting: Emergency Medicine

## 2023-02-09 ENCOUNTER — Ambulatory Visit: Payer: Self-pay | Admitting: Medical

## 2023-02-09 ENCOUNTER — Encounter (HOSPITAL_COMMUNITY): Payer: Self-pay

## 2023-02-09 ENCOUNTER — Other Ambulatory Visit: Payer: Self-pay

## 2023-02-09 ENCOUNTER — Emergency Department (HOSPITAL_COMMUNITY): Payer: Commercial Managed Care - HMO

## 2023-02-09 ENCOUNTER — Other Ambulatory Visit (HOSPITAL_BASED_OUTPATIENT_CLINIC_OR_DEPARTMENT_OTHER): Payer: Self-pay

## 2023-02-09 ENCOUNTER — Emergency Department (HOSPITAL_BASED_OUTPATIENT_CLINIC_OR_DEPARTMENT_OTHER): Payer: Commercial Managed Care - HMO

## 2023-02-09 ENCOUNTER — Telehealth: Payer: Self-pay

## 2023-02-09 ENCOUNTER — Encounter: Payer: Commercial Managed Care - HMO | Admitting: Internal Medicine

## 2023-02-09 DIAGNOSIS — N189 Chronic kidney disease, unspecified: Secondary | ICD-10-CM | POA: Insufficient documentation

## 2023-02-09 DIAGNOSIS — Z7984 Long term (current) use of oral hypoglycemic drugs: Secondary | ICD-10-CM | POA: Insufficient documentation

## 2023-02-09 DIAGNOSIS — R112 Nausea with vomiting, unspecified: Secondary | ICD-10-CM

## 2023-02-09 DIAGNOSIS — M79661 Pain in right lower leg: Secondary | ICD-10-CM | POA: Insufficient documentation

## 2023-02-09 DIAGNOSIS — Z91040 Latex allergy status: Secondary | ICD-10-CM | POA: Insufficient documentation

## 2023-02-09 DIAGNOSIS — Z79899 Other long term (current) drug therapy: Secondary | ICD-10-CM | POA: Insufficient documentation

## 2023-02-09 DIAGNOSIS — R0789 Other chest pain: Secondary | ICD-10-CM

## 2023-02-09 DIAGNOSIS — E1022 Type 1 diabetes mellitus with diabetic chronic kidney disease: Secondary | ICD-10-CM | POA: Insufficient documentation

## 2023-02-09 DIAGNOSIS — M79662 Pain in left lower leg: Secondary | ICD-10-CM | POA: Insufficient documentation

## 2023-02-09 DIAGNOSIS — Z7901 Long term (current) use of anticoagulants: Secondary | ICD-10-CM | POA: Diagnosis not present

## 2023-02-09 DIAGNOSIS — Z794 Long term (current) use of insulin: Secondary | ICD-10-CM | POA: Diagnosis not present

## 2023-02-09 DIAGNOSIS — I2510 Atherosclerotic heart disease of native coronary artery without angina pectoris: Secondary | ICD-10-CM | POA: Diagnosis not present

## 2023-02-09 LAB — CBC
HCT: 38.7 % (ref 36.0–46.0)
Hemoglobin: 12.4 g/dL (ref 12.0–15.0)
MCH: 28.4 pg (ref 26.0–34.0)
MCHC: 32 g/dL (ref 30.0–36.0)
MCV: 88.8 fL (ref 80.0–100.0)
Platelets: 532 10*3/uL — ABNORMAL HIGH (ref 150–400)
RBC: 4.36 MIL/uL (ref 3.87–5.11)
RDW: 13.8 % (ref 11.5–15.5)
WBC: 7.8 10*3/uL (ref 4.0–10.5)
nRBC: 0 % (ref 0.0–0.2)

## 2023-02-09 LAB — COMPREHENSIVE METABOLIC PANEL
ALT: 13 U/L (ref 0–44)
AST: 16 U/L (ref 15–41)
Albumin: 3.2 g/dL — ABNORMAL LOW (ref 3.5–5.0)
Alkaline Phosphatase: 102 U/L (ref 38–126)
Anion gap: 10 (ref 5–15)
BUN: 18 mg/dL (ref 6–20)
CO2: 24 mmol/L (ref 22–32)
Calcium: 9.2 mg/dL (ref 8.9–10.3)
Chloride: 101 mmol/L (ref 98–111)
Creatinine, Ser: 1.22 mg/dL — ABNORMAL HIGH (ref 0.44–1.00)
GFR, Estimated: 51 mL/min — ABNORMAL LOW (ref >60)
Glucose, Bld: 179 mg/dL — ABNORMAL HIGH (ref 70–99)
Potassium: 3.8 mmol/L (ref 3.5–5.1)
Sodium: 135 mmol/L (ref 135–145)
Total Bilirubin: 0.3 mg/dL (ref 0.0–1.2)
Total Protein: 7.7 g/dL (ref 6.5–8.1)

## 2023-02-09 LAB — TROPONIN I (HIGH SENSITIVITY)
Troponin I (High Sensitivity): 3 ng/L (ref <18)
Troponin I (High Sensitivity): 4 ng/L (ref <18)

## 2023-02-09 LAB — MAGNESIUM: Magnesium: 1.7 mg/dL (ref 1.7–2.4)

## 2023-02-09 MED ORDER — DROPERIDOL 2.5 MG/ML IJ SOLN
1.2500 mg | Freq: Once | INTRAMUSCULAR | Status: AC
  Administered 2023-02-09: 1.25 mg via INTRAVENOUS
  Filled 2023-02-09: qty 2

## 2023-02-09 MED ORDER — ONDANSETRON HCL 4 MG/2ML IJ SOLN
4.0000 mg | Freq: Once | INTRAMUSCULAR | Status: AC
  Administered 2023-02-09: 4 mg via INTRAVENOUS
  Filled 2023-02-09: qty 2

## 2023-02-09 MED ORDER — ONDANSETRON HCL 4 MG/2ML IJ SOLN
4.0000 mg | Freq: Once | INTRAMUSCULAR | Status: DC
  Filled 2023-02-09: qty 2

## 2023-02-09 MED ORDER — HYDROMORPHONE HCL 1 MG/ML IJ SOLN
1.0000 mg | Freq: Once | INTRAMUSCULAR | Status: AC
  Administered 2023-02-09: 1 mg via INTRAVENOUS
  Filled 2023-02-09: qty 1

## 2023-02-09 MED ORDER — OMEPRAZOLE 40 MG PO CPDR: 40.0000 mg | DELAYED_RELEASE_CAPSULE | Freq: Every day | ORAL | 0 refills | Status: DC

## 2023-02-09 MED ORDER — KETOROLAC TROMETHAMINE 30 MG/ML IJ SOLN
30.0000 mg | Freq: Once | INTRAMUSCULAR | Status: AC
  Administered 2023-02-09: 30 mg via INTRAVENOUS
  Filled 2023-02-09: qty 1

## 2023-02-09 MED ORDER — IOHEXOL 350 MG/ML SOLN
75.0000 mL | Freq: Once | INTRAVENOUS | Status: AC | PRN
  Administered 2023-02-09: 75 mL via INTRAVENOUS

## 2023-02-09 NOTE — Telephone Encounter (Signed)
PA covered 02/09/2023-08/08/2023

## 2023-02-09 NOTE — Telephone Encounter (Signed)
PA initiated via Covermymeds; KEY: [id number]. Awaiting determination.

## 2023-02-09 NOTE — ED Triage Notes (Signed)
Pt c.o continued chest pain since she was discharged last week with a PE, pt also  c.o bilateral leg pain, worse on the right. Pt taking Eliquis. Pt also c.o feeling sob and lightheaded

## 2023-02-09 NOTE — Telephone Encounter (Signed)
Copied from CRM [id number]. Topic: Clinical - Red Word Triage >> Feb 09, 2023  8:14 AM Truddie Crumble wrote: Red Word that prompted transfer to Nurse Triage:Pt just got out the hospital on last Monday and still having pain in ribs and chest  Chief Complaint: pain to chest and legs Symptoms: pain Frequency: constant Pertinent Negatives: Patient denies SENT TO ER Disposition: [x] ED /[] Urgent Care (no appt availability in office) / [] Appointment(In office/virtual)/ []  [facility]/ [] Home Care/ [] Refused Recommended Disposition /[] [facility]/ []  Follow-up with PCP Additional Notes: C/o chest and leg pain.  States was just in hospital with PE.  Patient instructed to go to the hospital and to call 911.   Denies need for this nurse to call 911.  Message sent to PCP.   Reason for Disposition  Shock suspected (e.g., cold/pale/clammy skin, too weak to stand, low BP, rapid pulse)  Answer Assessment - Initial Assessment Questions 1. ONSET: "When did the muscle aches or body pains start?"      Leg pain started a couple of days ago and chest pain since being in hospital, for PE 2. LOCATION: "What part of your body is hurting?" (e.g., entire body, arms, legs)      Legs and chest 3. SEVERITY: "How bad is the pain?" (Scale 1-10; or mild, moderate, severe)   - MILD (1-3): doesn't interfere with normal activities    - MODERATE (4-7): interferes with normal activities or awakens from sleep    - SEVERE (8-10):  excruciating pain, unable to do any normal activities      7.5/10 4. CAUSE: "What do you think is causing the pains?"     "I had a PE"  Protocols used: Muscle Aches and Body Pain-A-AH

## 2023-02-09 NOTE — ED Provider Triage Note (Signed)
Emergency Medicine Provider Triage Evaluation Note  [name] , a 57 y.o. female  was evaluated in triage.  Pt complains of ongoing chest pain, sometimes pleuritic, but not always.  Patient was recently diagnosed with pulmonary embolism and was admitted to the hospital.  She had reassuring echo and negative lower extremity DVT study.  She was started on 6 months of Eliquis.  She has been compliant but has not had her medication this morning.  She has had pain in her legs over the past 2 days, typically right lower ankle and calf which then moved to the left lower ankle and calf.  No fevers.  Shortness of breath occurs when she is up and active, not as much when she is sitting still.  Review of Systems  Positive: Chest pain Negative: Cough  Physical Exam  BP (!) 116/96 (BP Location: Right Arm)   Pulse 97   Temp 98.6 F (37 C)   Resp 16   SpO2 100%  Gen:   Awake, no distress, a bit anxious Resp:  Normal effort  MSK:   Moves extremities without difficulty  Other:  No tachycardia  Medical Decision Making  Medically screening exam initiated at 10:48 AM.  Appropriate orders placed.  ITHZEL ENGLEDOW was informed that the remainder of the evaluation will be completed by another provider, this initial triage assessment does not replace that evaluation, and the importance of remaining in the ED until their evaluation is complete.     Renne Crigler, PA-C 02/09/23 1049

## 2023-02-09 NOTE — Progress Notes (Signed)
Bilateral lower extremity venous duplex has been completed. Preliminary results can be found in CV Proc through chart review.  Results were given to Dr. Particia Nearing.  02/09/23 5:25 PM Olen Cordial RVT

## 2023-02-09 NOTE — ED Provider Notes (Signed)
[place] EMERGENCY DEPARTMENT AT [facility] Provider Note   CSN: [id number] Arrival date & time: 02/09/23  1026     History  Chief Complaint  Patient presents with   Chest Pain   Leg Pain    [name] is a 57 y.o. female.  Pt is a 57 yo female with pmhx significant for chronic pain, depression, gerd, cad, hld, dm1, ckd, and a recently diagnosed single, segmental PE on the left with no heart strain.  She was initially d/c from the ED, but came back with transient hypoxia and was admitted from 12/20-23.  She has been having increased pain and pcp increased her oxycodone to 10 mg.  She said the pain is still not improved.  Patient also has pain in her legs.  She has been taking her Eliquis.  Pt points to pain in the center of her chest that radiates to her right axilla.  It hurts with movement.  She denies f/c.         Home Medications Prior to Admission medications   Medication Sig Start Date End Date Taking? Authorizing Provider  albuterol (VENTOLIN HFA) 108 (90 Base) MCG/ACT inhaler Inhale 2 puffs into the lungs every 6 (six) hours as needed for wheezing or shortness of breath. 08/03/20  Yes Saguier, Ramon Dredge, PA-C  amLODipine (NORVASC) 5 MG tablet Take 1 tablet (5 mg total) by mouth daily. 11/17/22 11/12/23 Yes Saguier, Ramon Dredge, PA-C  Apixaban Starter Pack, 10mg  and 5mg , (ELIQUIS DVT/PE STARTER PACK) Take 2 tablets (10mg ) by mouth twice daily for 7 days, then 1 tablet (5mg ) twice daily 02/02/23  Yes Danford, Earl Lites, MD  atorvastatin (LIPITOR) 80 MG tablet Take 1 tablet (80 mg total) by mouth daily. 01/20/23  Yes Saguier, Ramon Dredge, PA-C  baclofen (LIORESAL) 10 MG tablet Take 1 tablet (10 mg total) by mouth 3 (three) times daily. Patient taking differently: Take 10 mg by mouth 3 (three) times daily as needed for muscle spasms. 11/17/22  Yes Saguier, Ramon Dredge, PA-C  busPIRone (BUSPAR) 15 MG tablet Take 1 tablet (15 mg total) by mouth 2 (two) times daily. Patient  taking differently: Take 15 mg by mouth daily as needed (anxiety). 01/13/22  Yes Saguier, Ramon Dredge, PA-C  carvedilol (COREG) 25 MG tablet Take 1 tablet (25 mg total) by mouth 2 (two) times daily with a meal. 11/17/22  Yes Saguier, Ramon Dredge, PA-C  fluticasone furoate-vilanterol (BREO ELLIPTA) 100-25 MCG/INH AEPB Inhale 1 puff into the lungs daily. Patient taking differently: Inhale 1 puff into the lungs daily as needed (Asthma). 08/03/20  Yes Saguier, Ramon Dredge, PA-C  gabapentin (NEURONTIN) 300 MG capsule TAKE 2 CAPSULES BY MOUTH THREE TIMES DAILY 08/12/22  Yes Saguier, Ramon Dredge, PA-C  Insulin Glargine (BASAGLAR KWIKPEN) 100 UNIT/ML INJECT 40 UNITS SUBCUTANEOUSLY AT BEDTIME Patient taking differently: Inject 25 Units into the skin at bedtime. 08/12/22  Yes Saguier, Ramon Dredge, PA-C  metFORMIN (GLUCOPHAGE-XR) 500 MG 24 hr tablet Take 1 tablet (500 mg total) by mouth daily with breakfast. 11/17/22  Yes Saguier, Ramon Dredge, PA-C  omeprazole (PRILOSEC) 40 MG capsule Take 1 capsule (40 mg total) by mouth daily. 02/09/23  Yes Jacalyn Lefevre, MD  Oxycodone HCl 10 MG TABS Take 1 tablet (10 mg total) by mouth every 6 (six) hours as needed. 02/02/23  Yes Danford, Earl Lites, MD  OZEMPIC, 0.25 OR 0.5 MG/DOSE, 2 MG/3ML SOPN Inject 0.5 mg into the skin once a week. mondays 10/14/22  Yes [provider]  pantoprazole (PROTONIX) 40 MG tablet Take 1 tablet (  40 mg total) by mouth 2 (two) times daily. 11/08/22 02/09/23 Yes Dorcas Carrow, MD  promethazine (PHENERGAN) 12.5 MG tablet Take 1 tablet (12.5 mg total) by mouth every 8 (eight) hours as needed for nausea or vomiting. 01/08/23  Yes Saguier, Ramon Dredge, PA-C  topiramate (TOPAMAX) 50 MG tablet Take 1 tablet (50 mg total) by mouth 2 (two) times daily. Patient taking differently: Take 50 mg by mouth 2 (two) times daily as needed (headache). 01/13/22  Yes Saguier, Ramon Dredge, PA-C  apixaban (ELIQUIS) 5 MG TABS tablet Take 1 tablet (5 mg total) by mouth 2 (two) times daily. 02/03/23    Saguier, Ramon Dredge, PA-C  Continuous Glucose Sensor (FREESTYLE LIBRE 3 SENSOR) MISC USE AS DIRECTED TO MONITOR BLOOD SUGAR. CHANGE EVERY 14 DAYS 08/07/22   [provider]  Insulin Pen Needle (PEN NEEDLES 31GX5/16") 31G X 8 MM MISC Use daily as directed 03/28/22   Saguier, Ramon Dredge, PA-C  nitroGLYCERIN (NITROSTAT) 0.4 MG SL tablet Place 1 tablet (0.4 mg total) under the tongue every 5 (five) minutes as needed for chest pain. Patient not taking: Reported on 02/09/2023 03/21/21   Saguier, Ramon Dredge, PA-C  VITAMIN D PO Take 5,000 Units by mouth daily.    [provider]      Allergies    Lisinopril, Oxycodone-acetaminophen, Latex, and Morphine    Review of Systems   Review of Systems  Cardiovascular:  Positive for chest pain.  Musculoskeletal:        BLE pain  All other systems reviewed and are negative.   Physical Exam Updated Vital Signs BP (!) 135/101   Pulse 84   Temp 98.7 F (37.1 C) (Oral)   Resp (!) 26   SpO2 100%  Physical Exam Vitals and nursing note reviewed.  Constitutional:      Appearance: She is well-developed.  HENT:     Head: Normocephalic and atraumatic.  Eyes:     Extraocular Movements: Extraocular movements intact.     Pupils: Pupils are equal, round, and reactive to light.  Cardiovascular:     Rate and Rhythm: Normal rate and regular rhythm.     Heart sounds: Normal heart sounds.  Pulmonary:     Effort: Pulmonary effort is normal.     Breath sounds: Normal breath sounds.  Abdominal:     General: Bowel sounds are normal.     Palpations: Abdomen is soft.  Musculoskeletal:        General: Normal range of motion.     Cervical back: Normal range of motion and neck supple.     Right lower leg: Tenderness present.     Left lower leg: Tenderness present.  Skin:    General: Skin is warm.     Capillary Refill: Capillary refill takes less than 2 seconds.  Neurological:     General: No focal deficit present.     Mental Status: She is alert and  oriented to person, place, and time.  Psychiatric:        Mood and Affect: Mood normal.        Behavior: Behavior normal.     ED Results / Procedures / Treatments   Labs (all labs ordered are listed, but only abnormal results are displayed) Labs Reviewed  CBC - Abnormal; Notable for the following components:      Result Value   Platelets 532 (*)    All other components within normal limits  COMPREHENSIVE METABOLIC PANEL - Abnormal; Notable for the following components:   Glucose, Bld 179 (*)  Creatinine, Ser 1.22 (*)    Albumin 3.2 (*)    GFR, Estimated 51 (*)    All other components within normal limits  MAGNESIUM  TROPONIN I (HIGH SENSITIVITY)  TROPONIN I (HIGH SENSITIVITY)    EKG EKG Interpretation Date/Time:  Monday February 09 2023 10:32:41 EST Ventricular Rate:  95 PR Interval:  158 QRS Duration:  78 QT Interval:  374 QTC Calculation: 469 R Axis:   41  Text Interpretation: Normal sinus rhythm Normal ECG When compared with ECG of 30-Jan-2023 20:33, PREVIOUS ECG IS PRESENT No significant change since last tracing Confirmed by Jacalyn Lefevre [phone]) on 02/09/2023 3:58:45 PM  Radiology CT Angio Chest PE W and/or Wo Contrast Result Date: 02/09/2023 CLINICAL DATA:  Pulmonary embolism (PE) suspected, high prob Chest pain. EXAM: CT ANGIOGRAPHY CHEST WITH CONTRAST TECHNIQUE: Multidetector CT imaging of the chest was performed using the standard protocol during bolus administration of intravenous contrast. Multiplanar CT image reconstructions and MIPs were obtained to evaluate the vascular anatomy. RADIATION DOSE REDUCTION: This exam was performed according to the departmental dose-optimization program which includes automated exposure control, adjustment of the mA and/or kV according to patient size and/or use of iterative reconstruction technique. CONTRAST:  75mL OMNIPAQUE IOHEXOL 350 MG/ML SOLN COMPARISON:  Radiograph earlier today. Chest CTA 11 days ago 01/29/2023  FINDINGS: Cardiovascular: Resolution of the previous left lower lobe pulmonary emboli. Residual thrombus. No new or progressive pulmonary embolus. Borderline cardiomegaly. Minor aortic atherosclerosis. No acute aortic findings. There are coronary artery calcifications. Trace pericardial effusion. Mediastinum/Nodes: No mediastinal or hilar adenopathy. Unremarkable appearance of the esophagus. No visible thyroid nodule. Lungs/Pleura: Resolved right upper lobe ground-glass opacity from prior exam. Improvement is ache attenuation and atelectasis in the lower lobes from prior exam. Mild hypoventilatory changes dependently persist. No new airspace disease. Trace bilateral pleural thickening without significant effusion. Trachea and central airways are clear. Upper Abdomen: High density within the renal collecting systems typical of excreted contrast. Cholecystectomy. Musculoskeletal: No acute findings, stable thoracic degenerative change. Review of the MIP images confirms the above findings. IMPRESSION: 1. Resolution of the previous left lower lobe pulmonary emboli. No new or progressive pulmonary embolus. 2. Resolved right upper lobe ground-glass opacity from prior exam. Improvement in hazy attenuation and atelectasis in the lower lobes from prior exam. 3. Coronary artery calcifications. Aortic Atherosclerosis (ICD10-I70.0). Electronically Signed   By: Narda Rutherford M.D.   On: 02/09/2023 21:53   VAS [place] LOWER EXTREMITY VENOUS (DVT) (ONLY [facility] & [facility]) Result Date: 02/09/2023  Lower Venous DVT Study Patient Name:  [name]  Date of Exam:   02/09/2023 Medical Rec #: [id number]           Accession #:    [id number] Date of Birth: [birth year]          Patient Gender: F Patient Age:   19 years Exam Location:  [facility] Procedure:      VAS [place] LOWER EXTREMITY VENOUS (DVT) Referring Phys: Albion Weatherholtz --------------------------------------------------------------------------------  Indications: Pain. Other  Indications: 01/31/2023 - Negative for DVT bilaterally. Risk Factors: Confirmed PE. Anticoagulation: Eliquis. Comparison Study: No prior studies. Performing Technologist: Chanda Busing RVT  Examination Guidelines: A complete evaluation includes B-mode imaging, spectral Doppler, color Doppler, and power Doppler as needed of all accessible portions of each vessel. Bilateral testing is considered an integral part of a complete examination. Limited examinations for reoccurring indications may be performed as noted. The reflux portion of the exam is performed with the patient in reverse Trendelenburg.  +---------+---------------+---------+-----------+----------+--------------+  RIGHT    CompressibilityPhasicitySpontaneityPropertiesThrombus Aging +---------+---------------+---------+-----------+----------+--------------+ CFV      Full           Yes      Yes                                 +---------+---------------+---------+-----------+----------+--------------+ SFJ      Full                                                        +---------+---------------+---------+-----------+----------+--------------+ FV Prox  Full                                                        +---------+---------------+---------+-----------+----------+--------------+ FV Mid   Full                                                        +---------+---------------+---------+-----------+----------+--------------+ FV DistalFull                                                        +---------+---------------+---------+-----------+----------+--------------+ PFV      Full                                                        +---------+---------------+---------+-----------+----------+--------------+ POP      Full           Yes      Yes                                 +---------+---------------+---------+-----------+----------+--------------+ PTV      Full                                                         +---------+---------------+---------+-----------+----------+--------------+ PERO     Full                                                        +---------+---------------+---------+-----------+----------+--------------+   +---------+---------------+---------+-----------+----------+--------------+ LEFT     CompressibilityPhasicitySpontaneityPropertiesThrombus Aging +---------+---------------+---------+-----------+----------+--------------+ CFV      Full           Yes      Yes                                 +---------+---------------+---------+-----------+----------+--------------+  SFJ      Full                                                        +---------+---------------+---------+-----------+----------+--------------+ FV Prox  Full                                                        +---------+---------------+---------+-----------+----------+--------------+ FV Mid   Full                                                        +---------+---------------+---------+-----------+----------+--------------+ FV DistalFull                                                        +---------+---------------+---------+-----------+----------+--------------+ PFV      Full                                                        +---------+---------------+---------+-----------+----------+--------------+ POP      Full           Yes      Yes                                 +---------+---------------+---------+-----------+----------+--------------+ PTV      Full                                                        +---------+---------------+---------+-----------+----------+--------------+ PERO     Full                                                        +---------+---------------+---------+-----------+----------+--------------+    Summary: RIGHT: - There is no evidence of deep vein thrombosis in the lower extremity.  - No  cystic structure found in the popliteal fossa.  LEFT: - There is no evidence of deep vein thrombosis in the lower extremity.  - No cystic structure found in the popliteal fossa.  *See table(s) above for measurements and observations.    Preliminary    DG Chest 2 View Result Date: 02/09/2023 CLINICAL DATA:  Ongoing chest pain. EXAM: CHEST - 2 VIEW COMPARISON:  01/30/2023. FINDINGS: Bilateral lung fields are clear. Bilateral costophrenic angles are clear. Normal cardio-mediastinal silhouette. No acute osseous abnormalities. The soft tissues are within  normal limits. IMPRESSION: No active cardiopulmonary disease. Electronically Signed   By: Jules Schick M.D.   On: 02/09/2023 11:50    Procedures Procedures    Medications Ordered in ED Medications  HYDROmorphone (DILAUDID) injection 1 mg (1 mg Intravenous Given 02/09/23 1631)  ondansetron (ZOFRAN) injection 4 mg (4 mg Intravenous Given 02/09/23 1631)  ketorolac (TORADOL) 30 MG/ML injection 30 mg (30 mg Intravenous Given 02/09/23 1703)  iohexol (OMNIPAQUE) 350 MG/ML injection 75 mL (75 mLs Intravenous Contrast Given 02/09/23 2007)  HYDROmorphone (DILAUDID) injection 1 mg (1 mg Intravenous Given 02/09/23 2029)  droperidol (INAPSINE) 2.5 MG/ML injection 1.25 mg (1.25 mg Intravenous Given 02/09/23 2136)    ED Course/ Medical Decision Making/ A&P                                 Medical Decision Making Amount and/or Complexity of Data Reviewed Labs: ordered. Radiology: ordered.  Risk Prescription drug management.   This patient presents to the ED for concern of cp, this involves an extensive number of treatment options, and is a complaint that carries with it a high risk of complications and morbidity.  The differential diagnosis includes cardiac, pulm, gi, msk   Co morbidities that complicate the patient evaluation  chronic pain, depression, gerd, cad, hld, dm1, ckd, and a recently diagnosed single, segmental PE on the left with no  heart strain   Additional history obtained:  Additional history obtained from epic chart review  Lab Tests:  I Ordered, and personally interpreted labs.  The pertinent results include:  cbc nl other than chronic thrombocytopenia (plt 532), trop times 2 neg, cmp with bs sl elevated at 179, cr stable at 1.22   Imaging Studies ordered:  I ordered imaging studies including cxr, cta chest, [place] legs I independently visualized and interpreted imaging which showed  Cxr: No active cardiopulmonary disease.  [place]: RIGHT:          - There is no evidence of deep vein thrombosis in the lower extremity.     - No cystic structure found in the popliteal fossa.     LEFT:          - There is no evidence of deep vein thrombosis in the lower extremity.     - No cystic structure found in the popliteal fossa.     *See table(s) above for measurements and observations.  CTA chest: Resolution of the previous left lower lobe pulmonary emboli. No  new or progressive pulmonary embolus.  2. Resolved right upper lobe ground-glass opacity from prior exam.  Improvement in hazy attenuation and atelectasis in the lower lobes  from prior exam.  3. Coronary artery calcifications.    Aortic Atherosclerosis (ICD10-I70.0).   I agree with the radiologist interpretation   Cardiac Monitoring:  The patient was maintained on a cardiac monitor.  I personally viewed and interpreted the cardiac monitored which showed an underlying rhythm of: nsr   Medicines ordered and prescription drug management:  I ordered medication including dilaudid, zofran, toradol  for pain  Reevaluation of the patient after these medicines showed that the patient improved I have reviewed the patients home medicines and have made adjustments as needed   Test Considered:  ct   Critical Interventions:  Pain control   Problem List / ED Course:  CP:  cardiac work up neg.  CTA reveals resolution of PE.  Pt is feeling  significantly better after inapsine.  I think her current cp is likely GI and possibly related to her DM.  Pt is d/c with prilosec and is referred to GI.  She is to return if worse.   BLE pain:  resolved now.  [place] remains neg for DVT   Reevaluation:  After the interventions noted above, I reevaluated the patient and found that they have :improved   Social Determinants of Health:  Lives at home   Dispostion:  After consideration of the diagnostic results and the patients response to treatment, I feel that the patent would benefit from discharge with outpatient f/u.          Final Clinical Impression(s) / ED Diagnoses Final diagnoses:  Atypical chest pain  Nausea and vomiting, unspecified vomiting type    Rx / DC Orders ED Discharge Orders          Ordered    omeprazole (PRILOSEC) 40 MG capsule  Daily        02/09/23 2206    Ambulatory referral to Gastroenterology        02/09/23 2206              Jacalyn Lefevre, MD 02/09/23 2258

## 2023-02-10 ENCOUNTER — Telehealth: Payer: Self-pay | Admitting: Medical

## 2023-02-10 ENCOUNTER — Other Ambulatory Visit (HOSPITAL_BASED_OUTPATIENT_CLINIC_OR_DEPARTMENT_OTHER): Payer: Self-pay

## 2023-02-10 NOTE — Telephone Encounter (Signed)
 Copied from CRM [id number]. Topic: General - Other >> Feb 10, 2023 12:33 PM Denese Killings wrote: Reason for CRM: Sue Lush Nurse Case Manager with [facility] called to see if pt had her hospital follow up and to wants to leave her contact number for pt chart.

## 2023-02-11 ENCOUNTER — Other Ambulatory Visit: Payer: Self-pay | Admitting: Medical

## 2023-02-12 ENCOUNTER — Other Ambulatory Visit: Payer: Self-pay | Admitting: Medical

## 2023-02-17 ENCOUNTER — Other Ambulatory Visit: Payer: Self-pay

## 2023-02-17 ENCOUNTER — Other Ambulatory Visit (HOSPITAL_BASED_OUTPATIENT_CLINIC_OR_DEPARTMENT_OTHER): Payer: Self-pay

## 2023-02-17 ENCOUNTER — Other Ambulatory Visit: Payer: Self-pay | Admitting: Medical

## 2023-02-17 DIAGNOSIS — K529 Noninfective gastroenteritis and colitis, unspecified: Secondary | ICD-10-CM

## 2023-02-17 MED ORDER — BASAGLAR KWIKPEN 100 UNIT/ML ~~LOC~~ SOPN
25.0000 [IU] | PEN_INJECTOR | Freq: Every day | SUBCUTANEOUS | 2 refills | Status: DC
  Filled 2023-02-17: qty 15, 60d supply, fill #0
  Filled 2023-04-22: qty 15, 60d supply, fill #1

## 2023-02-17 MED ORDER — BACLOFEN 10 MG PO TABS
10.0000 mg | ORAL_TABLET | Freq: Three times a day (TID) | ORAL | 3 refills | Status: DC | PRN
  Filled 2023-02-17: qty 30, 10d supply, fill #0
  Filled 2023-04-22: qty 30, 10d supply, fill #1

## 2023-02-17 MED ORDER — OXYCODONE HCL 10 MG PO TABS
10.0000 mg | ORAL_TABLET | Freq: Three times a day (TID) | ORAL | 0 refills | Status: DC | PRN
  Filled 2023-02-17: qty 90, 30d supply, fill #0

## 2023-02-17 NOTE — Telephone Encounter (Signed)
 Copied from CRM [id number]. Topic: Clinical - Medication Refill >> Feb 17, 2023 10:16 AM Corean SAUNDERS wrote: Most Recent Primary Care Visit:  Provider: DORINA LOVING  Department: [facility]  Visit Type: HOSPITAL FOLLOW UP  Date: 02/03/2023  Medication: baclofen  (LIORESAL ) 10 MG tablet  Insulin  Glargine (BASAGLAR  KWIKPEN) 100 UNIT/ML   Has the patient contacted their pharmacy? No, patient is out of re-fills (Agent: If no, request that the patient contact the pharmacy for the refill. If patient does not wish to contact the pharmacy document the reason why and proceed with request.) (Agent: If yes, when and what did the pharmacy advise?)  Is this the correct pharmacy for this prescription? Yes If no, delete pharmacy and type the correct one.  This is the patient's preferred pharmacy:  [facility] [address] ([name] [place] - [address] [place]) [place] [address] Phone: [phone] Fax: [phone]   Has the prescription been filled recently? Yes  Is the patient out of the medication? Yes  Has the patient been seen for an appointment in the last year OR does the patient have an upcoming appointment? Yes  Can we respond through MyChart? No  Agent: Please be advised that Rx refills may take up to 3 business days. We ask that you follow-up with your pharmacy.

## 2023-02-17 NOTE — Telephone Encounter (Signed)
 Up to date on contract and uds. Prior 2 reduced number rx. Will fill 90 tab rx today oxy.

## 2023-02-17 NOTE — Addendum Note (Signed)
 Addended by: Gwenevere Abbot on: 02/17/2023 12:05 PM   Modules accepted: Orders

## 2023-02-17 NOTE — Telephone Encounter (Signed)
 Requesting: Oxycodone Contract: 11/12/2022 UDS:11/30/2022 Last Visit:02/03/23 Next Visit:n/a Last Refill:02/02/23  Please Advise

## 2023-02-24 ENCOUNTER — Encounter (HOSPITAL_BASED_OUTPATIENT_CLINIC_OR_DEPARTMENT_OTHER): Payer: Self-pay

## 2023-02-24 ENCOUNTER — Ambulatory Visit (HOSPITAL_BASED_OUTPATIENT_CLINIC_OR_DEPARTMENT_OTHER)
Admission: RE | Admit: 2023-02-24 | Discharge: 2023-02-24 | Disposition: A | Discharge status: Home / Self Care | Payer: Commercial Managed Care - HMO | Source: Ambulatory Visit | Attending: Medical | Admitting: Medical

## 2023-02-24 DIAGNOSIS — Z1231 Encounter for screening mammogram for malignant neoplasm of breast: Secondary | ICD-10-CM | POA: Diagnosis present

## 2023-03-10 ENCOUNTER — Other Ambulatory Visit: Payer: Self-pay | Admitting: Medical

## 2023-03-10 MED ORDER — PROMETHAZINE HCL 12.5 MG PO TABS
12.5000 mg | ORAL_TABLET | Freq: Three times a day (TID) | ORAL | 0 refills | Status: DC | PRN
  Filled 2023-03-10: qty 20, 7d supply, fill #0

## 2023-03-10 NOTE — Telephone Encounter (Signed)
Copied from CRM [id number]. Topic: Clinical - Medication Refill >> Mar 10, 2023  1:45 PM Fuller Mandril wrote: Most Recent Primary Care Visit:  Provider: Esperanza Richters  Department: [facility]  Visit Type: HOSPITAL FOLLOW UP  Date: 02/03/2023  Medication: promethazine (PHENERGAN) 12.5 MG tablet  Has the patient contacted their pharmacy? Yes (Agent: If no, request that the patient contact the pharmacy for the refill. If patient does not wish to contact the pharmacy document the reason why and proceed with request.) (Agent: If yes, when and what did the pharmacy advise?) Provider approval required  Is this the correct pharmacy for this prescription?  If no, delete pharmacy and type the correct one.  This is the patient's preferred pharmacy:  [facility] [place] [facility] [address], [address] [place] [address] Phone: [phone] Fax: [phone]   Has the prescription been filled recently? Yes  Is the patient out of the medication? Yes  Has the patient been seen for an appointment in the last year OR does the patient have an upcoming appointment? Yes 02/03/2023  Can we respond through MyChart? No  Agent: Please be advised that Rx refills may take up to 3 business days. We ask that you follow-up with your pharmacy.

## 2023-03-10 NOTE — Telephone Encounter (Signed)
Copied from CRM [id number]. Topic: Clinical - Medication Refill >> Mar 10, 2023  1:45 PM Fuller Mandril wrote: Most Recent Primary Care Visit:  Provider: Esperanza Richters  Department: [facility]  Visit Type: HOSPITAL FOLLOW UP  Date: 02/03/2023  Medication: promethazine (PHENERGAN) 12.5 MG tablet  Has the patient contacted their pharmacy? Yes (Agent: If no, request that the patient contact the pharmacy for the refill. If patient does not wish to contact the pharmacy document the reason why and proceed with request.) (Agent: If yes, when and what did the pharmacy advise?) Provider approval required  Is this the correct pharmacy for this prescription? Yes If no, delete pharmacy and type the correct one.  This is the patient's preferred pharmacy:  [facility] [place] [facility] [address], [address] [place] [address] Phone: [phone] Fax: [phone]   Has the prescription been filled recently? Yes  Is the patient out of the medication? Yes  Has the patient been seen for an appointment in the last year OR does the patient have an upcoming appointment? Yes 02/03/2023  Can we respond through MyChart? No  Agent: Please be advised that Rx refills may take up to 3 business days. We ask that you follow-up with your pharmacy.

## 2023-03-10 NOTE — Telephone Encounter (Signed)
Rx refill phenergan sent.

## 2023-03-11 ENCOUNTER — Other Ambulatory Visit (HOSPITAL_BASED_OUTPATIENT_CLINIC_OR_DEPARTMENT_OTHER): Payer: Self-pay

## 2023-03-26 ENCOUNTER — Other Ambulatory Visit: Payer: Self-pay

## 2023-03-26 DIAGNOSIS — R87619 Unspecified abnormal cytological findings in specimens from cervix uteri: Secondary | ICD-10-CM | POA: Insufficient documentation

## 2023-03-26 DIAGNOSIS — J45909 Unspecified asthma, uncomplicated: Secondary | ICD-10-CM | POA: Insufficient documentation

## 2023-03-26 DIAGNOSIS — H269 Unspecified cataract: Secondary | ICD-10-CM | POA: Insufficient documentation

## 2023-03-27 ENCOUNTER — Ambulatory Visit: Payer: Commercial Managed Care - HMO | Admitting: Cardiology

## 2023-04-04 ENCOUNTER — Other Ambulatory Visit: Payer: Self-pay | Admitting: Medical

## 2023-04-06 ENCOUNTER — Telehealth: Payer: Self-pay | Admitting: Medical

## 2023-04-06 ENCOUNTER — Other Ambulatory Visit (HOSPITAL_BASED_OUTPATIENT_CLINIC_OR_DEPARTMENT_OTHER): Payer: Self-pay

## 2023-04-06 MED ORDER — APIXABAN 5 MG PO TABS: 5.0000 mg | ORAL_TABLET | Freq: Two times a day (BID) | ORAL | 4 refills | Status: DC

## 2023-04-06 MED ORDER — APIXABAN 5 MG PO TABS
5.0000 mg | ORAL_TABLET | Freq: Two times a day (BID) | ORAL | 4 refills | Status: DC
  Filled 2023-04-06: qty 60, 30d supply, fill #0

## 2023-04-06 NOTE — Addendum Note (Signed)
 Addended by: Maximino Sarin on: 04/06/2023 01:57 PM   Modules accepted: Orders

## 2023-04-06 NOTE — Telephone Encounter (Signed)
 Rx sent

## 2023-04-06 NOTE — Addendum Note (Signed)
 Addended by: Gwenevere Abbot on: 04/06/2023 02:51 PM   Modules accepted: Orders

## 2023-04-06 NOTE — Telephone Encounter (Signed)
 Per chart review-patient has refills on file at [facility]. Patient called and updated on refill availability. Phone number given to patient for community pharmacy.   Copied from CRM [id number]. Topic: Clinical - Medication Refill >> Apr 06, 2023  1:29 PM Aletta Edouard wrote: Most Recent Primary Care Visit:  Provider: Esperanza Richters  Department: [facility]  Visit Type: HOSPITAL FOLLOW UP  Date: 02/03/2023  Medication: apixaban (ELIQUIS) 5 MG  Has the patient contacted their pharmacy? No (Agent: If no, request that the patient contact the pharmacy for the refill. If patient does not wish to contact the pharmacy document the reason why and proceed with request.) (Agent: If yes, when and what did the pharmacy advise?)  Is this the correct pharmacy for this prescription? Yes If no, delete pharmacy and type the correct one.  This is the patient's preferred pharmacy:  [facility] [address] ([address] [place] - [address] [place] ([place]) [place] [address] Phone: [phone] Fax: [phone]      Has the prescription been filled recently? No  Is the patient out of the medication? Yes  Has the patient been seen for an appointment in the last year OR does the patient have an upcoming appointment? Yes  Can we respond through MyChart? No  Agent: Please be advised that Rx refills may take up to 3 business days. We ask that you follow-up with your pharmacy.

## 2023-04-13 ENCOUNTER — Other Ambulatory Visit: Payer: Self-pay | Admitting: Medical

## 2023-04-13 NOTE — Telephone Encounter (Signed)
 Phenergan rx sent in.

## 2023-04-17 ENCOUNTER — Other Ambulatory Visit: Payer: Self-pay | Admitting: Medical

## 2023-04-17 NOTE — Telephone Encounter (Signed)
 Per chart review, prescription for Baclofen was sent to pharmacy in January 2025. There are three refills attached to that prescription. Patient will need to call her pharmacy to request her refill. HIPAA compliant voicemail message was left for patient.

## 2023-04-17 NOTE — Telephone Encounter (Unsigned)
 Copied from CRM [id number]. Topic: Clinical - Medication Refill >> Apr 17, 2023  3:14 PM Alphonzo Lemmings O wrote: Most Recent Primary Care Visit:  Provider: Esperanza Richters  Department: [facility]  Visit Type: HOSPITAL FOLLOW UP  Date: 02/03/2023  Medication: baclofen (LIORESAL) 10 MG tablet  Has the patient contacted their pharmacy? Yes the pharmacy sent in a refill request no response (Agent: If no, request that the patient contact the pharmacy for the refill. If patient does not wish to contact the pharmacy document the reason why and proceed with request.) (Agent: If yes, when and what did the pharmacy advise?)  Is this the correct pharmacy for this prescription? Yes If no, delete pharmacy and type the correct one.  This is the patient's preferred pharmacy:  [facility] [address] ([address] [place] - [address] [place] ([place]) [place] [address] Phone: [phone] Fax: [phone]  [facility] [address] [place] - [address] [place] [address] Phone: [phone] Fax: [phone]  [facility] [address] [place] [address] Phone: [phone] Fax: [phone]  [facility] [address] [place] [address] Phone: [phone] Fax: [phone]  [facility] [address] [place] [address] Phone: [phone] Fax: [phone]   Has the prescription been filled recently? No  Is the patient out of the medication? Yes  Has the patient been seen for an appointment in the last year OR does the patient have an upcoming appointment? Yes  Can we respond through MyChart? No  Agent: Please be advised that Rx refills may take up to 3 business days. We ask that you follow-up with your pharmacy.

## 2023-04-22 ENCOUNTER — Telehealth: Payer: Self-pay

## 2023-04-22 ENCOUNTER — Other Ambulatory Visit: Payer: Self-pay | Admitting: Medical

## 2023-04-22 ENCOUNTER — Other Ambulatory Visit (HOSPITAL_BASED_OUTPATIENT_CLINIC_OR_DEPARTMENT_OTHER): Payer: Self-pay

## 2023-04-22 MED ORDER — BUSPIRONE HCL 15 MG PO TABS: 15.0000 mg | ORAL_TABLET | Freq: Two times a day (BID) | ORAL | 4 refills | Status: DC

## 2023-04-22 NOTE — Telephone Encounter (Signed)
 No immunizations on file nor on [facility] , okay to place visit for titers?    Copied from CRM [id number]. Topic: General - Other >> Apr 22, 2023 11:19 AM Denese Killings wrote: Reason for CRM: Patient wants to know if she had the MMR, Hep B, and Varicella Vaccinations. If not she wants to schedule for it.

## 2023-04-22 NOTE — Telephone Encounter (Signed)
 Copied from CRM [id number]. Topic: Clinical - Medication Refill >> Apr 22, 2023 12:03 PM Orinda Kenner C wrote: Most Recent Primary Care Visit:  Provider: Esperanza Richters  Department: [facility]  Visit Type: HOSPITAL FOLLOW UP  Date: 02/03/2023  Medication: busPIRone (BUSPAR) 15 MG tablet   Has the patient contacted their pharmacy? No (Agent: If no, request that the patient contact the pharmacy for the refill. If patient does not wish to contact the pharmacy document the reason why and proceed with request.) (Agent: If yes, when and what did the pharmacy advise?)  Is this the correct pharmacy for this prescription? Yes If no, delete pharmacy and type the correct one.  This is the patient's preferred pharmacy:  [facility] [address] ([address]),  - [address] [place] ([place]) [place] [address] Phone: [phone] Fax: [phone]   Has the prescription been filled recently? No  Is the patient out of the medication? Yes, patient is asking if medication can be sent today please.   Has the patient been seen for an appointment in the last year OR does the patient have an upcoming appointment? Yes  Can we respond through MyChart? No. Please call [phone]  Agent: Please be advised that Rx refills may take up to 3 business days. We ask that you follow-up with your pharmacy.

## 2023-04-22 NOTE — Telephone Encounter (Signed)
 Copied from CRM [id number]. Topic: Clinical - Request for Lab/Test Order >> Apr 22, 2023 11:15 AM Denese Killings wrote: Reason for CRM: Patient is requesting a chest xray for TB SKIN. She states she can't have it under her skin anymore.

## 2023-04-23 ENCOUNTER — Other Ambulatory Visit: Payer: Self-pay | Admitting: Medical

## 2023-04-23 NOTE — Telephone Encounter (Signed)
 Pt ok with getting tb gold

## 2023-04-23 NOTE — Addendum Note (Signed)
 Addended by: Gwenevere Abbot on: 04/23/2023 06:27 AM   Modules accepted: Orders

## 2023-04-23 NOTE — Telephone Encounter (Signed)
 Pt stated she needs records for new job, she stated the health department didn't have those immunizatios on file and is okay with getting bloodwork to test her titers and then immunizations if need be, please place titers

## 2023-04-23 NOTE — Telephone Encounter (Signed)
 Opened to review

## 2023-04-26 NOTE — Addendum Note (Signed)
 Addended by: Gwenevere Abbot on: 04/26/2023 09:01 AM   Modules accepted: Orders

## 2023-04-26 NOTE — Addendum Note (Signed)
 Addended by: Gwenevere Abbot on: 04/26/2023 08:59 AM   Modules accepted: Orders

## 2023-04-26 NOTE — Telephone Encounter (Signed)
 Ok placed immune status testing and tb gold test order. Pt should be aware we are not sure how her insurance covers immune status testing.

## 2023-04-27 NOTE — Telephone Encounter (Signed)
 Appt scheduled

## 2023-04-28 ENCOUNTER — Inpatient Hospital Stay (HOSPITAL_COMMUNITY)
Admission: EM | Admit: 2023-04-28 | Discharge: 2023-05-01 | DRG: 369 | Disposition: A | Discharge status: Home / Self Care | Attending: Internal Medicine | Admitting: Internal Medicine

## 2023-04-28 ENCOUNTER — Encounter (HOSPITAL_COMMUNITY): Payer: Self-pay | Admitting: Emergency Medicine

## 2023-04-28 ENCOUNTER — Emergency Department (HOSPITAL_COMMUNITY)

## 2023-04-28 ENCOUNTER — Other Ambulatory Visit: Payer: Self-pay

## 2023-04-28 DIAGNOSIS — K2970 Gastritis, unspecified, without bleeding: Secondary | ICD-10-CM | POA: Diagnosis present

## 2023-04-28 DIAGNOSIS — Z87440 Personal history of urinary (tract) infections: Secondary | ICD-10-CM

## 2023-04-28 DIAGNOSIS — Z833 Family history of diabetes mellitus: Secondary | ICD-10-CM

## 2023-04-28 DIAGNOSIS — Z7984 Long term (current) use of oral hypoglycemic drugs: Secondary | ICD-10-CM

## 2023-04-28 DIAGNOSIS — I429 Cardiomyopathy, unspecified: Secondary | ICD-10-CM | POA: Diagnosis present

## 2023-04-28 DIAGNOSIS — E785 Hyperlipidemia, unspecified: Secondary | ICD-10-CM | POA: Diagnosis present

## 2023-04-28 DIAGNOSIS — E1142 Type 2 diabetes mellitus with diabetic polyneuropathy: Secondary | ICD-10-CM | POA: Diagnosis present

## 2023-04-28 DIAGNOSIS — R112 Nausea with vomiting, unspecified: Secondary | ICD-10-CM | POA: Diagnosis present

## 2023-04-28 DIAGNOSIS — Z8249 Family history of ischemic heart disease and other diseases of the circulatory system: Secondary | ICD-10-CM

## 2023-04-28 DIAGNOSIS — N289 Disorder of kidney and ureter, unspecified: Secondary | ICD-10-CM

## 2023-04-28 DIAGNOSIS — Z888 Allergy status to other drugs, medicaments and biological substances status: Secondary | ICD-10-CM

## 2023-04-28 DIAGNOSIS — J45909 Unspecified asthma, uncomplicated: Secondary | ICD-10-CM | POA: Diagnosis present

## 2023-04-28 DIAGNOSIS — Z90711 Acquired absence of uterus with remaining cervical stump: Secondary | ICD-10-CM

## 2023-04-28 DIAGNOSIS — E1165 Type 2 diabetes mellitus with hyperglycemia: Secondary | ICD-10-CM | POA: Diagnosis not present

## 2023-04-28 DIAGNOSIS — N1831 Chronic kidney disease, stage 3a: Secondary | ICD-10-CM | POA: Diagnosis present

## 2023-04-28 DIAGNOSIS — Z8711 Personal history of peptic ulcer disease: Secondary | ICD-10-CM

## 2023-04-28 DIAGNOSIS — Z87891 Personal history of nicotine dependence: Secondary | ICD-10-CM

## 2023-04-28 DIAGNOSIS — Z8541 Personal history of malignant neoplasm of cervix uteri: Secondary | ICD-10-CM

## 2023-04-28 DIAGNOSIS — I130 Hypertensive heart and chronic kidney disease with heart failure and stage 1 through stage 4 chronic kidney disease, or unspecified chronic kidney disease: Secondary | ICD-10-CM | POA: Diagnosis present

## 2023-04-28 DIAGNOSIS — K920 Hematemesis: Secondary | ICD-10-CM | POA: Diagnosis not present

## 2023-04-28 DIAGNOSIS — K2100 Gastro-esophageal reflux disease with esophagitis, without bleeding: Secondary | ICD-10-CM | POA: Diagnosis present

## 2023-04-28 DIAGNOSIS — K226 Gastro-esophageal laceration-hemorrhage syndrome: Secondary | ICD-10-CM | POA: Diagnosis not present

## 2023-04-28 DIAGNOSIS — F419 Anxiety disorder, unspecified: Secondary | ICD-10-CM | POA: Diagnosis present

## 2023-04-28 DIAGNOSIS — I10 Essential (primary) hypertension: Secondary | ICD-10-CM | POA: Diagnosis present

## 2023-04-28 DIAGNOSIS — N390 Urinary tract infection, site not specified: Secondary | ICD-10-CM | POA: Diagnosis present

## 2023-04-28 DIAGNOSIS — I5032 Chronic diastolic (congestive) heart failure: Secondary | ICD-10-CM | POA: Diagnosis present

## 2023-04-28 DIAGNOSIS — Z91040 Latex allergy status: Secondary | ICD-10-CM

## 2023-04-28 DIAGNOSIS — Z823 Family history of stroke: Secondary | ICD-10-CM

## 2023-04-28 DIAGNOSIS — Z79899 Other long term (current) drug therapy: Secondary | ICD-10-CM

## 2023-04-28 DIAGNOSIS — E669 Obesity, unspecified: Secondary | ICD-10-CM | POA: Diagnosis present

## 2023-04-28 DIAGNOSIS — Z89422 Acquired absence of other left toe(s): Secondary | ICD-10-CM

## 2023-04-28 DIAGNOSIS — R1032 Left lower quadrant pain: Secondary | ICD-10-CM

## 2023-04-28 DIAGNOSIS — E7800 Pure hypercholesterolemia, unspecified: Secondary | ICD-10-CM | POA: Diagnosis present

## 2023-04-28 DIAGNOSIS — Z7901 Long term (current) use of anticoagulants: Secondary | ICD-10-CM

## 2023-04-28 DIAGNOSIS — Z86711 Personal history of pulmonary embolism: Secondary | ICD-10-CM | POA: Insufficient documentation

## 2023-04-28 DIAGNOSIS — E1122 Type 2 diabetes mellitus with diabetic chronic kidney disease: Secondary | ICD-10-CM | POA: Diagnosis present

## 2023-04-28 DIAGNOSIS — R1084 Generalized abdominal pain: Secondary | ICD-10-CM

## 2023-04-28 DIAGNOSIS — R7989 Other specified abnormal findings of blood chemistry: Secondary | ICD-10-CM

## 2023-04-28 DIAGNOSIS — E119 Type 2 diabetes mellitus without complications: Secondary | ICD-10-CM

## 2023-04-28 DIAGNOSIS — E1143 Type 2 diabetes mellitus with diabetic autonomic (poly)neuropathy: Secondary | ICD-10-CM | POA: Diagnosis present

## 2023-04-28 DIAGNOSIS — K922 Gastrointestinal hemorrhage, unspecified: Secondary | ICD-10-CM | POA: Diagnosis present

## 2023-04-28 DIAGNOSIS — Z7985 Long-term (current) use of injectable non-insulin antidiabetic drugs: Secondary | ICD-10-CM

## 2023-04-28 DIAGNOSIS — Z885 Allergy status to narcotic agent status: Secondary | ICD-10-CM

## 2023-04-28 DIAGNOSIS — N183 Chronic kidney disease, stage 3 unspecified: Secondary | ICD-10-CM | POA: Diagnosis present

## 2023-04-28 DIAGNOSIS — Z803 Family history of malignant neoplasm of breast: Secondary | ICD-10-CM

## 2023-04-28 DIAGNOSIS — R1110 Vomiting, unspecified: Secondary | ICD-10-CM | POA: Diagnosis present

## 2023-04-28 DIAGNOSIS — E872 Acidosis, unspecified: Secondary | ICD-10-CM | POA: Diagnosis present

## 2023-04-28 DIAGNOSIS — I2510 Atherosclerotic heart disease of native coronary artery without angina pectoris: Secondary | ICD-10-CM | POA: Diagnosis present

## 2023-04-28 DIAGNOSIS — G894 Chronic pain syndrome: Secondary | ICD-10-CM | POA: Diagnosis present

## 2023-04-28 DIAGNOSIS — Z9049 Acquired absence of other specified parts of digestive tract: Secondary | ICD-10-CM

## 2023-04-28 DIAGNOSIS — K3184 Gastroparesis: Secondary | ICD-10-CM | POA: Diagnosis present

## 2023-04-28 DIAGNOSIS — Z794 Long term (current) use of insulin: Secondary | ICD-10-CM

## 2023-04-28 LAB — BLOOD GAS, VENOUS
Acid-Base Excess: 4.2 mmol/L — ABNORMAL HIGH (ref 0.0–2.0)
Bicarbonate: 30.3 mmol/L — ABNORMAL HIGH (ref 20.0–28.0)
Drawn by: 5206
O2 Saturation: 73.6 %
Patient temperature: 37
pCO2, Ven: 50 mmHg (ref 44–60)
pH, Ven: 7.39 (ref 7.25–7.43)
pO2, Ven: 42 mmHg (ref 32–45)

## 2023-04-28 LAB — CBC WITH DIFFERENTIAL/PLATELET
Abs Immature Granulocytes: 0.03 10*3/uL (ref 0.00–0.07)
Basophils Absolute: 0 10*3/uL (ref 0.0–0.1)
Basophils Relative: 0 %
Eosinophils Absolute: 0 10*3/uL (ref 0.0–0.5)
Eosinophils Relative: 0 %
HCT: 43.3 % (ref 36.0–46.0)
Hemoglobin: 14.5 g/dL (ref 12.0–15.0)
Immature Granulocytes: 0 %
Lymphocytes Relative: 15 %
Lymphs Abs: 1.3 10*3/uL (ref 0.7–4.0)
MCH: 28.2 pg (ref 26.0–34.0)
MCHC: 33.5 g/dL (ref 30.0–36.0)
MCV: 84.2 fL (ref 80.0–100.0)
Monocytes Absolute: 0.2 10*3/uL (ref 0.1–1.0)
Monocytes Relative: 2 %
Neutro Abs: 6.8 10*3/uL (ref 1.7–7.7)
Neutrophils Relative %: 83 %
Platelets: 339 10*3/uL (ref 150–400)
RBC: 5.14 MIL/uL — ABNORMAL HIGH (ref 3.87–5.11)
RDW: 15.1 % (ref 11.5–15.5)
WBC: 8.3 10*3/uL (ref 4.0–10.5)
nRBC: 0 % (ref 0.0–0.2)

## 2023-04-28 LAB — COMPREHENSIVE METABOLIC PANEL
ALT: 19 U/L (ref 0–44)
AST: 28 U/L (ref 15–41)
Albumin: 3.9 g/dL (ref 3.5–5.0)
Alkaline Phosphatase: 97 U/L (ref 38–126)
Anion gap: 18 — ABNORMAL HIGH (ref 5–15)
BUN: 25 mg/dL — ABNORMAL HIGH (ref 6–20)
CO2: 20 mmol/L — ABNORMAL LOW (ref 22–32)
Calcium: 9.7 mg/dL (ref 8.9–10.3)
Chloride: 98 mmol/L (ref 98–111)
Creatinine, Ser: 1.41 mg/dL — ABNORMAL HIGH (ref 0.44–1.00)
GFR, Estimated: 43 mL/min — ABNORMAL LOW (ref >60)
Glucose, Bld: 309 mg/dL — ABNORMAL HIGH (ref 70–99)
Potassium: 4 mmol/L (ref 3.5–5.1)
Sodium: 136 mmol/L (ref 135–145)
Total Bilirubin: 0.8 mg/dL (ref 0.0–1.2)
Total Protein: 8.5 g/dL — ABNORMAL HIGH (ref 6.5–8.1)

## 2023-04-28 LAB — TYPE AND SCREEN
ABO/RH(D): AB POS
Antibody Screen: NEGATIVE

## 2023-04-28 LAB — LIPASE, BLOOD: Lipase: 41 U/L (ref 11–51)

## 2023-04-28 LAB — CBG MONITORING, ED: Glucose-Capillary: 273 mg/dL — ABNORMAL HIGH (ref 70–99)

## 2023-04-28 LAB — LACTIC ACID, PLASMA: Lactic Acid, Venous: 2.2 mmol/L (ref 0.5–1.9)

## 2023-04-28 MED ORDER — PROCHLORPERAZINE EDISYLATE 10 MG/2ML IJ SOLN
10.0000 mg | Freq: Once | INTRAMUSCULAR | Status: AC
  Administered 2023-04-28: 10 mg via INTRAVENOUS
  Filled 2023-04-28: qty 2

## 2023-04-28 MED ORDER — IOHEXOL 350 MG/ML SOLN
75.0000 mL | Freq: Once | INTRAVENOUS | Status: AC | PRN
  Administered 2023-04-28: 75 mL via INTRAVENOUS

## 2023-04-28 MED ORDER — HYDROMORPHONE HCL 1 MG/ML IJ SOLN
1.0000 mg | Freq: Once | INTRAMUSCULAR | Status: AC
  Administered 2023-04-28: 1 mg via INTRAVENOUS
  Filled 2023-04-28: qty 1

## 2023-04-28 MED ORDER — DIPHENHYDRAMINE HCL 50 MG/ML IJ SOLN
50.0000 mg | Freq: Once | INTRAMUSCULAR | Status: AC
  Administered 2023-04-28: 50 mg via INTRAVENOUS
  Filled 2023-04-28: qty 1

## 2023-04-28 MED ORDER — LACTATED RINGERS IV BOLUS
1000.0000 mL | Freq: Once | INTRAVENOUS | Status: AC
  Administered 2023-04-28: 1000 mL via INTRAVENOUS

## 2023-04-28 MED ORDER — ONDANSETRON HCL 4 MG/2ML IJ SOLN
4.0000 mg | Freq: Once | INTRAMUSCULAR | Status: AC
  Administered 2023-04-28: 4 mg via INTRAVENOUS
  Filled 2023-04-28: qty 2

## 2023-04-28 NOTE — ED Triage Notes (Addendum)
 Pt BIB EMS from home for vomiting x 2 days. Pt states that she has not eaten in 2 days, has not taken insulin in 2 days, hx of DM. CBG 322. Reports LUQ abd pain. Pt states she started vomiting blood today.

## 2023-04-28 NOTE — ED Provider Notes (Signed)
 [place] EMERGENCY DEPARTMENT AT [facility] Provider Note   CSN: [id number] Arrival date & time: 04/28/23  2057     History  Chief Complaint  Patient presents with   Emesis    [name] is a 58 y.o. female.  58 year old female with past medical history of hypertension and VTE on Eliquis presenting to the emergency department today with abdominal pain.  The patient states she has been having abdominal pain and vomiting throughout the day today.  She has had multiple episodes of nonbloody, nonbilious emesis initially.  She states that the last few episodes she has had blood in her emesis.  She states that she is having severe abdominal pain.  She reports a history of cholecystectomy.  Reports she had a normal bowel movement yesterday.  She has not had a bowel movement today which is at the ordinary for her.  She came to the ER today due to these ongoing symptoms.  She denies any associated chest pain.   Emesis Associated symptoms: abdominal pain        Home Medications Prior to Admission medications   Medication Sig Start Date End Date Taking? Authorizing Provider  albuterol (VENTOLIN HFA) 108 (90 Base) MCG/ACT inhaler Inhale 2 puffs into the lungs every 6 (six) hours as needed for wheezing or shortness of breath. 08/03/20  Yes Saguier, Ramon Dredge, PA-C  amLODipine (NORVASC) 5 MG tablet Take 1 tablet (5 mg total) by mouth daily. 11/17/22 11/12/23 Yes Saguier, Ramon Dredge, PA-C  apixaban (ELIQUIS) 5 MG TABS tablet Take 1 tablet (5 mg total) by mouth 2 (two) times daily. 04/06/23  Yes Saguier, Ramon Dredge, PA-C  atorvastatin (LIPITOR) 80 MG tablet Take 1 tablet (80 mg total) by mouth daily. 01/20/23  Yes Saguier, Ramon Dredge, PA-C  baclofen (LIORESAL) 10 MG tablet Take 1 tablet (10 mg total) by mouth 3 (three) times daily as needed for spasms 02/17/23  Yes Saguier, Ramon Dredge, PA-C  busPIRone (BUSPAR) 15 MG tablet Take 1 tablet (15 mg total) by mouth 2 (two) times daily. 04/22/23  Yes Saguier,  Ramon Dredge, PA-C  carvedilol (COREG) 25 MG tablet Take 1 tablet (25 mg total) by mouth 2 (two) times daily with a meal. 04/23/23  Yes Saguier, Ramon Dredge, PA-C  fluticasone furoate-vilanterol (BREO ELLIPTA) 100-25 MCG/INH AEPB Inhale 1 puff into the lungs daily. Patient taking differently: Inhale 1 puff into the lungs daily as needed (Asthma). 08/03/20  Yes Saguier, Ramon Dredge, PA-C  gabapentin (NEURONTIN) 300 MG capsule TAKE 2 CAPSULES BY MOUTH THREE TIMES DAILY 04/06/23  Yes Saguier, Ramon Dredge, PA-C  Insulin Glargine (BASAGLAR KWIKPEN) 100 UNIT/ML Inject 25 Units into the skin at bedtime. 02/17/23  Yes Saguier, Ramon Dredge, PA-C  metFORMIN (GLUCOPHAGE-XR) 500 MG 24 hr tablet Take 1 tablet (500 mg total) by mouth daily with breakfast. 11/17/22  Yes Saguier, Ramon Dredge, PA-C  nitroGLYCERIN (NITROSTAT) 0.4 MG SL tablet Place 1 tablet (0.4 mg total) under the tongue every 5 (five) minutes as needed for chest pain. 03/21/21  Yes Saguier, Ramon Dredge, PA-C  Oxycodone HCl 10 MG TABS Take 1 tablet (10 mg total) by mouth 3 (three) times daily as needed for severe pain 02/17/23  Yes Saguier, Ramon Dredge, PA-C  OZEMPIC, 0.25 OR 0.5 MG/DOSE, 2 MG/3ML SOPN Inject 0.5 mg into the skin once a week. mondays 10/14/22  Yes [provider]  promethazine (PHENERGAN) 12.5 MG tablet TAKE 1 TABLET BY MOUTH EVERY 8 HOURS AS NEEDED FOR NAUSEA FOR VOMITING 04/13/23  Yes Saguier, Ramon Dredge, PA-C  topiramate (TOPAMAX) 50 MG tablet  Take 1 tablet (50 mg total) by mouth 2 (two) times daily. Patient taking differently: Take 50 mg by mouth 2 (two) times daily as needed (headache). 01/13/22  Yes Saguier, Ramon Dredge, PA-C  VITAMIN D PO Take 5,000 Units by mouth daily.   Yes [provider]  Insulin Pen Needle (PEN NEEDLES 31GX5/16") 31G X 8 MM MISC Use daily as directed 03/28/22   Saguier, Ramon Dredge, PA-C  pantoprazole (PROTONIX) 40 MG tablet Take 1 tablet (40 mg total) by mouth 2 (two) times daily. 05/01/23 05/31/23  Pokhrel, Rebekah Chesterfield, MD      Allergies    Lisinopril,  Oxycodone-acetaminophen, Latex, and Morphine    Review of Systems   Review of Systems  Gastrointestinal:  Positive for abdominal pain and vomiting.  All other systems reviewed and are negative.   Physical Exam Updated Vital Signs BP 133/85   Pulse 80   Temp 98.3 F (36.8 C) (Oral)   Resp 18   Ht 6' (1.829 m)   Wt 79.4 kg   SpO2 98%   BMI 23.73 kg/m  Physical Exam Vitals and nursing note reviewed.   Gen: Appears uncomfortable Eyes: PERRL, EOMI HEENT: no oropharyngeal swelling Neck: trachea midline Resp: clear to auscultation bilaterally Card: RRR, no murmurs, rubs, or gallops Abd: Diffusely tender with maximal tenderness over the left periumbilical region Extremities: no calf tenderness, no edema Vascular: 2+ radial pulses bilaterally, 2+ DP pulses bilaterally Neuro: No focal deficits Skin: no rashes Psyc: acting appropriately   ED Results / Procedures / Treatments   Labs (all labs ordered are listed, but only abnormal results are displayed) Labs Reviewed  CBC WITH DIFFERENTIAL/PLATELET - Abnormal; Notable for the following components:      Result Value   RBC 5.14 (*)    All other components within normal limits  COMPREHENSIVE METABOLIC PANEL - Abnormal; Notable for the following components:   CO2 20 (*)    Glucose, Bld 309 (*)    BUN 25 (*)    Creatinine, Ser 1.41 (*)    Total Protein 8.5 (*)    GFR, Estimated 43 (*)    Anion gap 18 (*)    All other components within normal limits  URINALYSIS, ROUTINE W REFLEX MICROSCOPIC - Abnormal; Notable for the following components:   APPearance HAZY (*)    Glucose, UA >=500 (*)    Hgb urine dipstick SMALL (*)    Ketones, ur 20 (*)    Protein, ur 100 (*)    Leukocytes,Ua TRACE (*)    Bacteria, UA FEW (*)    All other components within normal limits  BLOOD GAS, VENOUS - Abnormal; Notable for the following components:   Bicarbonate 30.3 (*)    Acid-Base Excess 4.2 (*)    All other components within normal limits   LACTIC ACID, PLASMA - Abnormal; Notable for the following components:   Lactic Acid, Venous 2.2 (*)    All other components within normal limits  COMPREHENSIVE METABOLIC PANEL - Abnormal; Notable for the following components:   Glucose, Bld 118 (*)    Creatinine, Ser 1.10 (*)    Albumin 3.4 (*)    GFR, Estimated 58 (*)    All other components within normal limits  GLUCOSE, CAPILLARY - Abnormal; Notable for the following components:   Glucose-Capillary 105 (*)    All other components within normal limits  GLUCOSE, CAPILLARY - Abnormal; Notable for the following components:   Glucose-Capillary 200 (*)    All other components within normal limits  GLUCOSE, CAPILLARY - Abnormal; Notable for the following components:   Glucose-Capillary 124 (*)    All other components within normal limits  HEMOGLOBIN AND HEMATOCRIT, BLOOD - Abnormal; Notable for the following components:   Hemoglobin 11.2 (*)    HCT 34.4 (*)    All other components within normal limits  GLUCOSE, CAPILLARY - Abnormal; Notable for the following components:   Glucose-Capillary 152 (*)    All other components within normal limits  GLUCOSE, CAPILLARY - Abnormal; Notable for the following components:   Glucose-Capillary 106 (*)    All other components within normal limits  GLUCOSE, CAPILLARY - Abnormal; Notable for the following components:   Glucose-Capillary 150 (*)    All other components within normal limits  CBG MONITORING, ED - Abnormal; Notable for the following components:   Glucose-Capillary 273 (*)    All other components within normal limits  CBG MONITORING, ED - Abnormal; Notable for the following components:   Glucose-Capillary 189 (*)    All other components within normal limits  CBG MONITORING, ED - Abnormal; Notable for the following components:   Glucose-Capillary 155 (*)    All other components within normal limits  LIPASE, BLOOD  LACTIC ACID, PLASMA  HIV ANTIBODY (ROUTINE TESTING W REFLEX)   GLUCOSE, CAPILLARY  HEMOGLOBIN AND HEMATOCRIT, BLOOD  HEMOGLOBIN AND HEMATOCRIT, BLOOD  GLUCOSE, CAPILLARY  GLUCOSE, CAPILLARY  GLUCOSE, CAPILLARY  GLUCOSE, CAPILLARY  GLUCOSE, CAPILLARY  GLUCOSE, CAPILLARY  GLUCOSE, CAPILLARY  TYPE AND SCREEN  ABO/RH    EKG EKG Interpretation Date/Time:  Tuesday April 28 2023 21:43:03 EDT Ventricular Rate:  104 PR Interval:  153 QRS Duration:  83 QT Interval:  367 QTC Calculation: 483 R Axis:   61  Text Interpretation: Sinus tachycardia Ventricular premature complex Confirmed by Beckey Downing [phone]) on 04/28/2023 9:45:16 PM  Radiology No results found.  Procedures Procedures    Medications Ordered in ED Medications  lactated ringers infusion (0 mLs Intravenous Stopped 04/30/23 1251)  lactated ringers bolus 1,000 mL (0 mLs Intravenous Stopped 04/28/23 2307)  HYDROmorphone (DILAUDID) injection 1 mg (1 mg Intravenous Given 04/28/23 2117)  ondansetron (ZOFRAN) injection 4 mg (4 mg Intravenous Given 04/28/23 2116)  prochlorperazine (COMPAZINE) injection 10 mg (10 mg Intravenous Given 04/28/23 2208)  diphenhydrAMINE (BENADRYL) injection 50 mg (50 mg Intravenous Given 04/28/23 2204)  lactated ringers bolus 1,000 mL (0 mLs Intravenous Stopped 04/29/23 0056)  iohexol (OMNIPAQUE) 350 MG/ML injection 75 mL (75 mLs Intravenous Contrast Given 04/28/23 2248)  HYDROmorphone (DILAUDID) injection 1 mg (1 mg Intravenous Given 04/28/23 2333)  cefTRIAXone (ROCEPHIN) 1 g in sodium chloride 0.9 % 100 mL IVPB (0 g Intravenous Stopped 04/29/23 0203)  pantoprazole (PROTONIX) injection 40 mg (40 mg Intravenous Given 04/29/23 0101)  HYDROmorphone (DILAUDID) injection 0.5 mg (0.5 mg Intravenous Given 04/29/23 0508)  prochlorperazine (COMPAZINE) injection 5 mg (5 mg Intravenous Given 04/29/23 0522)  diphenhydrAMINE (BENADRYL) injection 25 mg (25 mg Intravenous Given 04/29/23 0606)  prochlorperazine (COMPAZINE) injection 5 mg (5 mg Intravenous Given by Other 04/29/23 1803)   diphenhydrAMINE (BENADRYL) injection 25 mg (25 mg Intravenous Given by Other 04/29/23 1803)    ED Course/ Medical Decision Making/ A&P                                 Medical Decision Making 58 year old female with past medical history of diabetes, hypertension, and DVT on Eliquis presenting to the emergency department today with abdominal pain and  vomiting.  I will further evaluate patient here with basic labs including LFTs and a lipase to evaluate for hepatobiliary pathology or pancreatitis.  Will obtain a CT scan of her abdomen to further evaluate for appendicitis, diverticulitis, colitis, bowel obstruction, or other intra-abdominal pathology.  Will obtain a CT angiogram to evaluate for active bleeding although based on description of her symptoms this hematemesis sounds more like a Mallory-Weiss tear.  Given the hematemesis I will obtain a type and screen.  She will likely require admission due to this.  The patient's labs here are reassuring but she remained tachycardic.  She is still symptomatic despite having a reassuring CT scan.  Given her ongoing symptoms as well as the hematemesis and ongoing tachycardia she will require admission.  Signed out to Dr. Preston Fleeting at signout.  Amount and/or Complexity of Data Reviewed Labs: ordered. Radiology: ordered.  Risk Prescription drug management. Decision regarding hospitalization.           Final Clinical Impression(s) / ED Diagnoses Final diagnoses:  Hematemesis with nausea  Chronic anticoagulation  Abdominal pain, generalized  Elevated lactic acid level  Renal insufficiency    Rx / DC Orders ED Discharge Orders          Ordered    Increase activity slowly        05/01/23 1100    Discharge instructions       Comments: Follow-up with your primary care provider in 1 week.  Check blood work at that time.  Please stick to small frequent meals, low residue diet.  Avoid fatt fried cheesy greasy food.  Seek medical attention for  worsening symptoms.  Start blood thinners after dental procedure is done or if no plan for dental procedure start on 05/02/2023   05/01/23 1100    Diet general       Comments: Low fat   05/01/23 1100    pantoprazole (PROTONIX) 40 MG tablet  2 times daily        05/01/23 1100              Durwin Glaze, MD 05/02/23 [phone]

## 2023-04-29 ENCOUNTER — Telehealth: Payer: Self-pay | Admitting: *Deleted

## 2023-04-29 ENCOUNTER — Other Ambulatory Visit

## 2023-04-29 ENCOUNTER — Encounter (HOSPITAL_COMMUNITY): Payer: Self-pay | Admitting: Internal Medicine

## 2023-04-29 DIAGNOSIS — J45909 Unspecified asthma, uncomplicated: Secondary | ICD-10-CM | POA: Diagnosis present

## 2023-04-29 DIAGNOSIS — Z7984 Long term (current) use of oral hypoglycemic drugs: Secondary | ICD-10-CM | POA: Diagnosis not present

## 2023-04-29 DIAGNOSIS — Z7985 Long-term (current) use of injectable non-insulin antidiabetic drugs: Secondary | ICD-10-CM | POA: Diagnosis not present

## 2023-04-29 DIAGNOSIS — K226 Gastro-esophageal laceration-hemorrhage syndrome: Secondary | ICD-10-CM | POA: Diagnosis not present

## 2023-04-29 DIAGNOSIS — Z86711 Personal history of pulmonary embolism: Secondary | ICD-10-CM

## 2023-04-29 DIAGNOSIS — E1143 Type 2 diabetes mellitus with diabetic autonomic (poly)neuropathy: Secondary | ICD-10-CM | POA: Diagnosis present

## 2023-04-29 DIAGNOSIS — K3184 Gastroparesis: Secondary | ICD-10-CM | POA: Diagnosis not present

## 2023-04-29 DIAGNOSIS — E7800 Pure hypercholesterolemia, unspecified: Secondary | ICD-10-CM | POA: Diagnosis present

## 2023-04-29 DIAGNOSIS — E669 Obesity, unspecified: Secondary | ICD-10-CM | POA: Diagnosis present

## 2023-04-29 DIAGNOSIS — I429 Cardiomyopathy, unspecified: Secondary | ICD-10-CM | POA: Diagnosis present

## 2023-04-29 DIAGNOSIS — K922 Gastrointestinal hemorrhage, unspecified: Secondary | ICD-10-CM | POA: Diagnosis present

## 2023-04-29 DIAGNOSIS — K2970 Gastritis, unspecified, without bleeding: Secondary | ICD-10-CM | POA: Diagnosis present

## 2023-04-29 DIAGNOSIS — E872 Acidosis, unspecified: Secondary | ICD-10-CM | POA: Diagnosis present

## 2023-04-29 DIAGNOSIS — K920 Hematemesis: Secondary | ICD-10-CM | POA: Diagnosis present

## 2023-04-29 DIAGNOSIS — I130 Hypertensive heart and chronic kidney disease with heart failure and stage 1 through stage 4 chronic kidney disease, or unspecified chronic kidney disease: Secondary | ICD-10-CM | POA: Diagnosis present

## 2023-04-29 DIAGNOSIS — G894 Chronic pain syndrome: Secondary | ICD-10-CM | POA: Diagnosis present

## 2023-04-29 DIAGNOSIS — K2100 Gastro-esophageal reflux disease with esophagitis, without bleeding: Secondary | ICD-10-CM | POA: Diagnosis present

## 2023-04-29 DIAGNOSIS — E1122 Type 2 diabetes mellitus with diabetic chronic kidney disease: Secondary | ICD-10-CM | POA: Diagnosis present

## 2023-04-29 DIAGNOSIS — R1110 Vomiting, unspecified: Secondary | ICD-10-CM | POA: Diagnosis present

## 2023-04-29 DIAGNOSIS — E1165 Type 2 diabetes mellitus with hyperglycemia: Secondary | ICD-10-CM | POA: Diagnosis not present

## 2023-04-29 DIAGNOSIS — N390 Urinary tract infection, site not specified: Secondary | ICD-10-CM | POA: Diagnosis present

## 2023-04-29 DIAGNOSIS — R1032 Left lower quadrant pain: Secondary | ICD-10-CM | POA: Diagnosis not present

## 2023-04-29 DIAGNOSIS — I2510 Atherosclerotic heart disease of native coronary artery without angina pectoris: Secondary | ICD-10-CM | POA: Diagnosis present

## 2023-04-29 DIAGNOSIS — Z794 Long term (current) use of insulin: Secondary | ICD-10-CM

## 2023-04-29 DIAGNOSIS — F419 Anxiety disorder, unspecified: Secondary | ICD-10-CM | POA: Diagnosis present

## 2023-04-29 DIAGNOSIS — N1831 Chronic kidney disease, stage 3a: Secondary | ICD-10-CM | POA: Diagnosis present

## 2023-04-29 DIAGNOSIS — E1169 Type 2 diabetes mellitus with other specified complication: Secondary | ICD-10-CM

## 2023-04-29 DIAGNOSIS — Z7901 Long term (current) use of anticoagulants: Secondary | ICD-10-CM | POA: Diagnosis not present

## 2023-04-29 DIAGNOSIS — K219 Gastro-esophageal reflux disease without esophagitis: Secondary | ICD-10-CM

## 2023-04-29 DIAGNOSIS — E1142 Type 2 diabetes mellitus with diabetic polyneuropathy: Secondary | ICD-10-CM | POA: Diagnosis present

## 2023-04-29 DIAGNOSIS — I5032 Chronic diastolic (congestive) heart failure: Secondary | ICD-10-CM | POA: Diagnosis present

## 2023-04-29 LAB — COMPREHENSIVE METABOLIC PANEL
ALT: 13 U/L (ref 0–44)
AST: 16 U/L (ref 15–41)
Albumin: 3.4 g/dL — ABNORMAL LOW (ref 3.5–5.0)
Alkaline Phosphatase: 86 U/L (ref 38–126)
Anion gap: 9 (ref 5–15)
BUN: 19 mg/dL (ref 6–20)
CO2: 29 mmol/L (ref 22–32)
Calcium: 9.1 mg/dL (ref 8.9–10.3)
Chloride: 104 mmol/L (ref 98–111)
Creatinine, Ser: 1.1 mg/dL — ABNORMAL HIGH (ref 0.44–1.00)
GFR, Estimated: 58 mL/min — ABNORMAL LOW (ref >60)
Glucose, Bld: 118 mg/dL — ABNORMAL HIGH (ref 70–99)
Potassium: 3.8 mmol/L (ref 3.5–5.1)
Sodium: 142 mmol/L (ref 135–145)
Total Bilirubin: 0.6 mg/dL (ref 0.0–1.2)
Total Protein: 7.6 g/dL (ref 6.5–8.1)

## 2023-04-29 LAB — URINALYSIS, ROUTINE W REFLEX MICROSCOPIC
Bilirubin Urine: NEGATIVE
Glucose, UA: >=500 mg/dL — AB
Ketones, ur: 20 mg/dL — AB
Nitrite: NEGATIVE
Protein, ur: 100 mg/dL — AB
Specific Gravity, Urine: 1.028 (ref 1.005–1.030)
pH: 6 (ref 5.0–8.0)

## 2023-04-29 LAB — CBG MONITORING, ED
Glucose-Capillary: 189 mg/dL — ABNORMAL HIGH (ref 70–99)
Glucose-Capillary: 155 mg/dL — ABNORMAL HIGH (ref 70–99)

## 2023-04-29 LAB — HEMOGLOBIN AND HEMATOCRIT, BLOOD
HCT: 38.1 % (ref 36.0–46.0)
Hemoglobin: 12.4 g/dL (ref 12.0–15.0)

## 2023-04-29 LAB — GLUCOSE, CAPILLARY
Glucose-Capillary: 92 mg/dL (ref 70–99)
Glucose-Capillary: 105 mg/dL — ABNORMAL HIGH (ref 70–99)
Glucose-Capillary: 95 mg/dL (ref 70–99)

## 2023-04-29 LAB — HIV ANTIBODY (ROUTINE TESTING W REFLEX): HIV Screen 4th Generation wRfx: NONREACTIVE

## 2023-04-29 LAB — LACTIC ACID, PLASMA: Lactic Acid, Venous: 1.6 mmol/L (ref 0.5–1.9)

## 2023-04-29 LAB — ABO/RH: ABO/RH(D): AB POS

## 2023-04-29 MED ORDER — INSULIN ASPART 100 UNIT/ML IJ SOLN
0.0000 [IU] | INTRAMUSCULAR | Status: DC
  Administered 2023-04-29: 2 [IU] via SUBCUTANEOUS
  Administered 2023-04-30: 1 [IU] via SUBCUTANEOUS
  Administered 2023-04-30 (×2): 2 [IU] via SUBCUTANEOUS

## 2023-04-29 MED ORDER — HYDRALAZINE HCL 20 MG/ML IJ SOLN: 10.0000 mg | Freq: Four times a day (QID) | INTRAMUSCULAR | Status: DC | PRN

## 2023-04-29 MED ORDER — INSULIN GLARGINE 100 UNIT/ML ~~LOC~~ SOLN
10.0000 [IU] | Freq: Every day | SUBCUTANEOUS | Status: DC
  Administered 2023-04-29 – 2023-04-30 (×2): 10 [IU] via SUBCUTANEOUS
  Filled 2023-04-29 (×3): qty 0.1

## 2023-04-29 MED ORDER — SODIUM CHLORIDE 0.9 % IV SOLN
1.0000 g | Freq: Once | INTRAVENOUS | Status: AC
  Administered 2023-04-29: 1 g via INTRAVENOUS
  Filled 2023-04-29: qty 10

## 2023-04-29 MED ORDER — HYDROMORPHONE HCL 1 MG/ML IJ SOLN
0.5000 mg | INTRAMUSCULAR | Status: DC | PRN
  Administered 2023-04-29 – 2023-04-30 (×3): 0.5 mg via INTRAVENOUS
  Filled 2023-04-29 (×5): qty 0.5

## 2023-04-29 MED ORDER — DIPHENHYDRAMINE HCL 50 MG/ML IJ SOLN
25.0000 mg | Freq: Four times a day (QID) | INTRAMUSCULAR | Status: AC | PRN
  Administered 2023-04-29 (×2): 25 mg via INTRAVENOUS
  Filled 2023-04-29 (×2): qty 1

## 2023-04-29 MED ORDER — PROCHLORPERAZINE EDISYLATE 10 MG/2ML IJ SOLN
5.0000 mg | Freq: Once | INTRAMUSCULAR | Status: AC
  Administered 2023-04-29: 5 mg via INTRAVENOUS
  Filled 2023-04-29: qty 2

## 2023-04-29 MED ORDER — PANTOPRAZOLE SODIUM 40 MG IV SOLR
40.0000 mg | Freq: Once | INTRAVENOUS | Status: AC
  Administered 2023-04-29: 40 mg via INTRAVENOUS
  Filled 2023-04-29: qty 10

## 2023-04-29 MED ORDER — SODIUM CHLORIDE 0.9 % IV SOLN
1.0000 g | INTRAVENOUS | Status: DC
  Administered 2023-04-29 – 2023-04-30 (×2): 1 g via INTRAVENOUS
  Filled 2023-04-29 (×2): qty 10

## 2023-04-29 MED ORDER — LACTATED RINGERS IV SOLN: INTRAVENOUS | Status: AC

## 2023-04-29 MED ORDER — METOCLOPRAMIDE HCL 5 MG/ML IJ SOLN
10.0000 mg | Freq: Three times a day (TID) | INTRAMUSCULAR | Status: DC | PRN
  Administered 2023-04-30 (×2): 10 mg via INTRAVENOUS
  Filled 2023-04-29 (×2): qty 2

## 2023-04-29 MED ORDER — PROCHLORPERAZINE EDISYLATE 10 MG/2ML IJ SOLN
5.0000 mg | Freq: Four times a day (QID) | INTRAMUSCULAR | Status: AC | PRN
  Administered 2023-04-29 (×2): 5 mg via INTRAVENOUS
  Filled 2023-04-29 (×2): qty 2

## 2023-04-29 MED ORDER — DIPHENHYDRAMINE HCL 50 MG/ML IJ SOLN
25.0000 mg | Freq: Once | INTRAMUSCULAR | Status: AC
  Administered 2023-04-29: 25 mg via INTRAVENOUS
  Filled 2023-04-29: qty 1

## 2023-04-29 MED ORDER — ONDANSETRON HCL 4 MG/2ML IJ SOLN: 4.0000 mg | Freq: Once | INTRAMUSCULAR | Status: DC

## 2023-04-29 MED ORDER — HYDROMORPHONE HCL 1 MG/ML IJ SOLN
0.5000 mg | Freq: Once | INTRAMUSCULAR | Status: AC
  Administered 2023-04-29: 0.5 mg via INTRAVENOUS
  Filled 2023-04-29: qty 1

## 2023-04-29 MED ORDER — PANTOPRAZOLE SODIUM 40 MG IV SOLR
40.0000 mg | Freq: Two times a day (BID) | INTRAVENOUS | Status: DC
  Administered 2023-04-29 – 2023-05-01 (×5): 40 mg via INTRAVENOUS
  Filled 2023-04-29 (×5): qty 10

## 2023-04-29 NOTE — Telephone Encounter (Signed)
 Who Is Calling Patient / Member / Family / Caregiver Caller Name [name] Caller Phone Number [phone] Call Type Message Only Information Provided Reason for Call Request to Cancel Office Appointment Initial Comment Caller states she has been hospitalized and needs to CANCEL her 815am appointment for 04/29/2023.Marland Kitchen Patient request to speak to RN No Translation No Disp. Time Lamount Cohen Time) Disposition Final User 04/29/2023 7:10:36 AM General Information Provided Yes Vivianne Master Final Disposition 04/29/2023 7:10:36 AM General Information Provided Yes King-Hussey, Sherlyn Lees

## 2023-04-29 NOTE — H&P (Signed)
 History and Physical    [name]:[id number] DOB: 1965-07-06 DOA: 04/28/2023  Patient coming from: Home.  Chief Complaint: Nausea vomiting.  HPI: [name] is a 58 y.o. female with history of diabetes mellitus type 2, hypertension, hyperlipidemia, chronic kidney disease stage III with diagnosis of PE in December 2024 and was also admitted for intractable nausea vomiting in September 2024 attributed to gastroparesis EGD was unremarkable presents to the ER with intractable nausea vomiting since yesterday with diffuse abdominal discomfort.  Patient states the initial vomitus was yellowish in color but eventually became bloody.  Denies any diarrhea.  Patient has been taking Eliquis since diagnosis of pulmonary embolism in December 2024 but has not taken it for last 4 days because patient had an appoint with dentist yesterday and has been off Eliquis.  ED Course: In the ER CT angiogram of the abdomen pelvis does not show any active bleeding.  Hemoglobin is 14.5 blood glucose 309 creatinine 1.4 lipase 41 LFTs unremarkable.  Patient admitted for intractable nausea vomiting with hematemesis.  Review of Systems: As per HPI, rest all negative.   Past Medical History:  Diagnosis Date   Abdominal pain with vomiting    ABDOMINAL PAIN-EPIGASTRIC 09/14/2009   Qualifier: Diagnosis of   By: Monica Becton PA-c, [name]        Abnormal CT scan 11/05/2022   Abnormal Pap smear of cervix    Abscess of left lower leg    Acute cystitis 07/07/2017   Acute kidney injury superimposed on chronic kidney disease (HCC) 04/22/2021   Acute pulmonary embolism (HCC) 01/30/2023   Acute pyelonephritis 08/02/2022   Acute renal failure superimposed on stage 3a chronic kidney disease (HCC) 11/04/2022   Acute respiratory failure with hypoxia (HCC) 01/31/2023   ANKLE PAIN, LEFT 12/25/2009   Qualifier: Diagnosis of   By: Delrae Alfred MD, [name]         Anxiety 01/22/2016   Asthma    Asthma without acute  exacerbation 01/31/2023   CAD (coronary artery disease) 07/07/2017   Cardiac murmur 02/01/2019   Cardiomegaly 01/31/2023   Cardiomyopathy, secondary (HCC) 09/06/2009   Qualifier: Diagnosis of  By: Jolene Provost   Formatting of this note might be different from the original. Overview:  Qualifier: Diagnosis of  By: Jolene Provost   Cardiomyopathy, unspecified (HCC) 09/06/2009   Formatting of this note might be different from the original.  Overview:   Overview:   Qualifier: Diagnosis of   By: Jolene Provost     Overview:   Overview:   Qualifier: Diagnosis of   By: Jolene Provost  Formatting of this note might be different from the original.  Overview:   Qualifier: Diagnosis of   By: Jolene Provost     Cataract    Cellulitis of left leg 04/22/2021   Cerebrovascular disease 09/13/2012   Cervical cancer (HCC) 2001   Radical hysterectomy in [facility]   CHEST PAIN, ATYPICAL 07/30/2009   Qualifier: Diagnosis of   By: Delrae Alfred MD, Beverely Low of this note might be different from the original.  Overview:   Qualifier: Diagnosis of   By: Delrae Alfred MD, [name]     Chest pain, unspecified 04/22/2021   Chronic back pain    Chronic diastolic HF (heart failure) (HCC) 05/21/2021   Chronic kidney disease, stage III (moderate) (HCC) 05/23/2014   Chronic pain syndrome 01/23/2016   Coffee ground emesis 11/05/2022  Coronary artery disease    30% lesions noted   Current use of insulin (HCC) 05/23/2014   Dehydration 08/02/2022   Depression    Diabetes type 2, uncontrolled 05/23/2014   Diabetic gastroparesis associated with type 1 diabetes mellitus (HCC) 11/27/2016   DIABETIC PERIPHERAL NEUROPATHY 09/21/2009   Qualifier: Diagnosis of  By: Delrae Alfred MD, [name]     Diabetic peripheral neuropathy (HCC) 01/23/2016   DM2 (diabetes mellitus, type 2) (HCC) 07/30/2009   Qualifier: Diagnosis of   By: Delrae Alfred MD,  [name]         Dyslipidemia 02/01/2019   Dysuria 12/25/2009   Qualifier: Diagnosis of   By: Delrae Alfred MD, [name]         Essential hypertension 08/30/2009   Qualifier: Diagnosis of  By: Cristela Felt, CNA, Christy     Gastric atony 07/30/2009   Formatting of this note might be different from the original.  Overview:   Qualifier: Diagnosis of   By: Delrae Alfred MD, [name]     Gastroesophageal reflux disease 07/30/2009   Formatting of this note might be different from the original. Overview:  Overview:  Qualifier: Diagnosis of  By: Delrae Alfred MD, Alfonso Patten: Diagnosis of  By: Wilmon Pali NP, Consuela Mimes of this note might be different from the original. Overview:  Qualifier: Diagnosis of  By: Delrae Alfred MD, Alfonso Patten: Diagnosis of  By: Wilmon Pali NP, Gunnar Fusi   Gastroparesis 07/30/2009   Qualifier: Diagnosis of   By: Delrae Alfred MD, [name]         GERD 07/30/2009   Qualifier: Diagnosis of   By: Delrae Alfred MD, Alfonso Patten: Diagnosis of   By: Wilmon Pali NP, Gunnar Fusi         GERD (gastroesophageal reflux disease)    High anion gap metabolic acidosis 08/02/2022   History of cervical cancer 08/17/2015   Status post partial hysterectomy  Formatting of this note might be different from the original. Status post partial hysterectomy   HLD (hyperlipidemia) 07/30/2009   Qualifier: Diagnosis of   By: Delrae Alfred MD, [name]         Hyperlipidemia    Hyperlipidemia, unspecified 07/30/2009   Formatting of this note might be different from the original.  Overview:   Qualifier: Diagnosis of   By: Delrae Alfred MD, Beverely Low of this note might be different from the original.  Overview:   Qualifier: Diagnosis of   By: Delrae Alfred MD, [name]     Hypertension    Hypertensive urgency 08/30/2009   Qualifier: Diagnosis of   By: Cristela Felt, CNA, Christy         Hypomagnesemia 01/31/2023   INSOMNIA 09/21/2009   Qualifier: Diagnosis of  By: Delrae Alfred MD, [name]     Intractable nausea and  vomiting 08/03/2022   Lumbar facet arthropathy 05/14/2016   Lumbar spinal stenosis 02/11/2018   Metatarsalgia of both feet 03/11/2017   Migraine 09/13/2012   Overview:  IMPRESSION: possible abd migraine   Mood disorder (HCC) 01/05/2022   Nausea & vomiting 07/07/2017   Nausea with vomiting, unspecified 09/14/2009   Qualifier: Diagnosis of   By: Myrtie Hawk, [name]        Formatting of this note might be different from the original.  Overview:   Qualifier: Diagnosis of   By: Myrtie Hawk, [name]     Neuropathy 07/20/2017   Overview:   Diabetic     Formatting of this note might be different from the original.  Diabetic  Nonspecific abnormal findings on radiological and examination of skull and head 09/13/2012   Normocytic anemia 01/31/2023   Osteomyelitis of second toe of left foot (HCC) 01/05/2022   Overweight (BMI 25.0-29.9) 07/21/2014   Pain of left calf 04/19/2021   PUD (peptic ulcer disease) 09/28/2009   Qualifier: Diagnosis of   By: Wilmon Pali NP, Paula         Pure hypercholesterolemia 11/27/2016   Retention of urine 06/25/2011   Sepsis due to cellulitis (HCC) 04/22/2021   Severe sepsis (HCC) 04/22/2021   SIRS (systemic inflammatory response syndrome) (HCC) 05/21/2021   Thrombocytosis 01/31/2023   TOBACCO ABUSE 09/21/2009   Qualifier: Diagnosis of  By: Delrae Alfred MD, Beverely Low of this note might be different from the original. Overview:  Qualifier: Diagnosis of  By: Delrae Alfred MD, Claybon Jabs, SERUM, ELEVATED 09/21/2009   Qualifier: Diagnosis of   By: Delrae Alfred MD, [name]         Trigeminal neuralgia    Tuberculin test reaction 07/30/2009   Formatting of this note might be different from the original.  Overview:   Annotation: CXR clear ?  diagnosed in 2/11?  On INH/pyridoxine  Qualifier: Diagnosis of   By: Delrae Alfred MD, [name]     Type 2 diabetes mellitus with hyperglycemia (HCC) 07/30/2009   Qualifier: Diagnosis of   By: Delrae Alfred MD, Beverely Low of this note might be different from the original.  Overview:   Qualifier: Diagnosis of   By: Delrae Alfred MD, [name]     Type 2 diabetes mellitus with stage 3 chronic kidney disease (HCC) 09/14/2009   Qualifier: Diagnosis of   By: Myrtie Hawk, [name]        Uncontrolled type 2 diabetes mellitus with hyperglycemia, without long-term current use of insulin (HCC) 04/22/2021   UTI (urinary tract infection) 09/21/2009   Qualifier: Diagnosis of   By: Delrae Alfred MD, Adele Dan, CANDIDAL 12/25/2009   Qualifier: Diagnosis of  By: Delrae Alfred MD, [name]     Viral upper respiratory tract infection 11/30/2020   Vitamin D deficiency 04/25/2013    Past Surgical History:  Procedure Laterality Date   ABDOMINAL HYSTERECTOMY     radical hysterectomy   AMPUTATION Left 01/06/2022   Procedure: AMPUTATION 2nd TOE;  Surgeon: Nadara Mustard, MD;  Location: [facility] OR;  Service: Orthopedics;  Laterality: Left;   BIOPSY  11/06/2022   Procedure: BIOPSY;  Surgeon: Imogene Burn, MD;  Location: [facility] ENDOSCOPY;  Service: Gastroenterology;;   CHOLECYSTECTOMY     ESOPHAGOGASTRODUODENOSCOPY (EGD) WITH PROPOFOL N/A 11/06/2022   Procedure: ESOPHAGOGASTRODUODENOSCOPY (EGD) WITH PROPOFOL;  Surgeon: Imogene Burn, MD;  Location: [facility] ENDOSCOPY;  Service: Gastroenterology;  Laterality: N/A;   I & D EXTREMITY Left 04/24/2021   Procedure: LEFT LEG DEBRIDEMENT;  Surgeon: Nadara Mustard, MD;  Location: [facility] OR;  Service: Orthopedics;  Laterality: Left;   I & D EXTREMITY Left 05/01/2021   Procedure: LEFT KNEE DEBRIDEMENT;  Surgeon: Nadara Mustard, MD;  Location: [facility] OR;  Service: Orthopedics;  Laterality: Left;     reports that she quit smoking about 35 years ago. Her smoking use included cigarettes. She has never used smokeless tobacco. She reports current alcohol use. She reports that she does not use drugs.  Allergies  Allergen Reactions   Lisinopril Swelling    Angioedema   Oxycodone-Acetaminophen  Nausea Only   Latex Itching, Swelling and Rash   Morphine Itching  and Rash    Family History  Problem Relation Age of Onset   Breast cancer Mother    Stroke Mother    Hypertension Mother    Diabetes Mother    Hypertension Father    Diabetes Father    Heart attack Father    Hypertension Sister    Diabetes Sister    Hypertension Brother    Diabetes Brother    Hypertension Brother    Diabetes Brother    Stroke Maternal Grandmother    Colon cancer Neg Hx    Colon polyps Neg Hx    Esophageal cancer Neg Hx    Stomach cancer Neg Hx    Rectal cancer Neg Hx     Prior to Admission medications   Medication Sig Start Date End Date Taking? Authorizing Provider  albuterol (VENTOLIN HFA) 108 (90 Base) MCG/ACT inhaler Inhale 2 puffs into the lungs every 6 (six) hours as needed for wheezing or shortness of breath. 08/03/20  Yes Saguier, Ramon Dredge, PA-C  amLODipine (NORVASC) 5 MG tablet Take 1 tablet (5 mg total) by mouth daily. 11/17/22 11/12/23 Yes Saguier, Ramon Dredge, PA-C  apixaban (ELIQUIS) 5 MG TABS tablet Take 1 tablet (5 mg total) by mouth 2 (two) times daily. 04/06/23  Yes Saguier, Ramon Dredge, PA-C  atorvastatin (LIPITOR) 80 MG tablet Take 1 tablet (80 mg total) by mouth daily. 01/20/23  Yes Saguier, Ramon Dredge, PA-C  baclofen (LIORESAL) 10 MG tablet Take 1 tablet (10 mg total) by mouth 3 (three) times daily as needed for spasms 02/17/23  Yes Saguier, Ramon Dredge, PA-C  busPIRone (BUSPAR) 15 MG tablet Take 1 tablet (15 mg total) by mouth 2 (two) times daily. 04/22/23  Yes Saguier, Ramon Dredge, PA-C  carvedilol (COREG) 25 MG tablet Take 1 tablet (25 mg total) by mouth 2 (two) times daily with a meal. 04/23/23  Yes Saguier, Ramon Dredge, PA-C  fluticasone furoate-vilanterol (BREO ELLIPTA) 100-25 MCG/INH AEPB Inhale 1 puff into the lungs daily. Patient taking differently: Inhale 1 puff into the lungs daily as needed (Asthma). 08/03/20  Yes Saguier, Ramon Dredge, PA-C  gabapentin (NEURONTIN) 300 MG capsule TAKE 2 CAPSULES BY MOUTH  THREE TIMES DAILY 04/06/23  Yes Saguier, Ramon Dredge, PA-C  Insulin Glargine (BASAGLAR KWIKPEN) 100 UNIT/ML Inject 25 Units into the skin at bedtime. 02/17/23  Yes Saguier, Ramon Dredge, PA-C  metFORMIN (GLUCOPHAGE-XR) 500 MG 24 hr tablet Take 1 tablet (500 mg total) by mouth daily with breakfast. 11/17/22  Yes Saguier, Ramon Dredge, PA-C  nitroGLYCERIN (NITROSTAT) 0.4 MG SL tablet Place 1 tablet (0.4 mg total) under the tongue every 5 (five) minutes as needed for chest pain. 03/21/21  Yes Saguier, Ramon Dredge, PA-C  omeprazole (PRILOSEC) 40 MG capsule Take 1 capsule (40 mg total) by mouth daily. 02/09/23  Yes Jacalyn Lefevre, MD  Oxycodone HCl 10 MG TABS Take 1 tablet (10 mg total) by mouth 3 (three) times daily as needed for severe pain 02/17/23  Yes Saguier, Ramon Dredge, PA-C  OZEMPIC, 0.25 OR 0.5 MG/DOSE, 2 MG/3ML SOPN Inject 0.5 mg into the skin once a week. mondays 10/14/22  Yes [provider]  pantoprazole (PROTONIX) 40 MG tablet Take 1 tablet (40 mg total) by mouth 2 (two) times daily. 11/08/22 04/28/24 Yes Ghimire, Lyndel Safe, MD  promethazine (PHENERGAN) 12.5 MG tablet TAKE 1 TABLET BY MOUTH EVERY 8 HOURS AS NEEDED FOR NAUSEA FOR VOMITING 04/13/23  Yes Saguier, Ramon Dredge, PA-C  topiramate (TOPAMAX) 50 MG tablet Take 1 tablet (50 mg total) by mouth 2 (two) times daily. Patient taking differently: Take 50 mg by  mouth 2 (two) times daily as needed (headache). 01/13/22  Yes Saguier, Ramon Dredge, PA-C  VITAMIN D PO Take 5,000 Units by mouth daily.   Yes [provider]  Insulin Pen Needle (PEN NEEDLES 31GX5/16") 31G X 8 MM MISC Use daily as directed 03/28/22   Esperanza Richters, PA-C    Physical Exam: Constitutional: Moderately built and nourished. Vitals:   04/29/23 0400 04/29/23 0500 04/29/23 0530 04/29/23 0600  BP: 124/84 (!) 160/131 115/74   Pulse: 94 (!) 109 88 83  Resp: 19 12 (!) 8 10  Temp:      TempSrc:      SpO2: 97% 100% 95% 97%  Weight:      Height:       Eyes: Anicteric no pallor. ENMT: No discharge from the  ears eyes nose or mouth. Neck: No mass felt.  No neck rigidity. Respiratory: No rhonchi or crepitations. Cardiovascular: S1 and S2 heard. Abdomen: Soft nontender bowel sound present. Musculoskeletal: No edema. Skin: No rash. Neurologic: Alert awake oriented to time place and person.  Moves all extremities. Psychiatric: Appears normal.  Normal affect.   Labs on Admission: I have personally reviewed following labs and imaging studies  CBC: Recent Labs  Lab 04/28/23 2120  WBC 8.3  NEUTROABS 6.8  HGB 14.5  HCT 43.3  MCV 84.2  PLT 339   Basic Metabolic Panel: Recent Labs  Lab 04/28/23 2120  NA 136  K 4.0  CL 98  CO2 20*  GLUCOSE 309*  BUN 25*  CREATININE 1.41*  CALCIUM 9.7   GFR: Estimated Creatinine Clearance: 50.2 mL/min (A) (by C-G formula based on SCr of 1.41 mg/dL (H)). Liver Function Tests: Recent Labs  Lab 04/28/23 2120  AST 28  ALT 19  ALKPHOS 97  BILITOT 0.8  PROT 8.5*  ALBUMIN 3.9   Recent Labs  Lab 04/28/23 2120  LIPASE 41   No results for input(s): "AMMONIA" in the last 168 hours. Coagulation Profile: No results for input(s): "INR", "PROTIME" in the last 168 hours. Cardiac Enzymes: No results for input(s): "CKTOTAL", "CKMB", "CKMBINDEX", "TROPONINI" in the last 168 hours. BNP (last 3 results) No results for input(s): "PROBNP" in the last 8760 hours. HbA1C: No results for input(s): "HGBA1C" in the last 72 hours. CBG: Recent Labs  Lab 04/28/23 2118  GLUCAP 273*   Lipid Profile: No results for input(s): "CHOL", "HDL", "LDLCALC", "TRIG", "CHOLHDL", "LDLDIRECT" in the last 72 hours. Thyroid Function Tests: No results for input(s): "TSH", "T4TOTAL", "FREET4", "T3FREE", "THYROIDAB" in the last 72 hours. Anemia Panel: No results for input(s): "VITAMINB12", "FOLATE", "FERRITIN", "TIBC", "IRON", "RETICCTPCT" in the last 72 hours. Urine analysis:    Component Value Date/Time   COLORURINE YELLOW 04/29/2023 0002   APPEARANCEUR HAZY (A)  04/29/2023 0002   LABSPEC 1.028 04/29/2023 0002   PHURINE 6.0 04/29/2023 0002   GLUCOSEU >=500 (A) 04/29/2023 0002   HGBUR SMALL (A) 04/29/2023 0002   HGBUR negative 01/08/2010 1045   BILIRUBINUR NEGATIVE 04/29/2023 0002   BILIRUBINUR Negative 03/27/2022 1536   KETONESUR 20 (A) 04/29/2023 0002   PROTEINUR 100 (A) 04/29/2023 0002   UROBILINOGEN 0.2 (A) 03/27/2022 1536   UROBILINOGEN 1.0 11/14/2013 0741   NITRITE NEGATIVE 04/29/2023 0002   LEUKOCYTESUR TRACE (A) 04/29/2023 0002   Sepsis Labs: @LABRCNTIP (procalcitonin:4,lacticidven:4) )No results found for this or any previous visit (from the past 240 hours).   Radiological Exams on Admission: CT ANGIO GI BLEED Result Date: 04/29/2023 CLINICAL DATA:  Left upper quadrant pain, vomiting blood, low appetite, chronic  mesenteric ischemia EXAM: CTA ABDOMEN AND PELVIS WITHOUT AND WITH CONTRAST TECHNIQUE: Multidetector CT imaging of the abdomen and pelvis was performed using the standard protocol during bolus administration of intravenous contrast. Multiplanar reconstructed images and MIPs were obtained and reviewed to evaluate the vascular anatomy. RADIATION DOSE REDUCTION: This exam was performed according to the departmental dose-optimization program which includes automated exposure control, adjustment of the mA and/or kV according to patient size and/or use of iterative reconstruction technique. CONTRAST:  75mL OMNIPAQUE IOHEXOL 350 MG/ML SOLN COMPARISON:  02/02/2023 FINDINGS: VASCULAR Scattered calcified atherosclerotic plaque the aorta and its mesenteric, renal, and iliac artery branches without hemodynamically significant stenosis. No aortic aneurysm or dissection. Review of the MIP images confirms the above findings. NON-VASCULAR Lower chest: No acute abnormality. Hepatobiliary: Cholecystectomy. No biliary dilation. Unremarkable liver. Pancreas: Unremarkable. Spleen: Unremarkable. Adrenals/Urinary Tract: Normal adrenal glands. No urinary calculi  or hydronephrosis. Mild diffuse bladder wall thickening. Gas within the anti dependent bladder. Stomach/Bowel: No bowel obstruction or bowel wall thickening. No evidence of active GI bleeding. Stomach and appendix are within normal limits. Lymphatic: No lymphadenopathy.  Pelvic lymph node dissection. Reproductive: Postoperative change of partial hysterectomy. No adnexal mass. Other: No free intraperitoneal fluid or air. Musculoskeletal: No acute fracture. IMPRESSION: 1. No evidence of active GI bleeding. No evidence of mesenteric ischemia. 2. Mild diffuse bladder wall thickening with gas within the anti dependent bladder. Correlate with recent instrumentation and urinalysis to exclude cystitis. 3.  Aortic Atherosclerosis (ICD10-I70.0). Electronically Signed   By: Minerva Fester M.D.   On: 04/29/2023 00:05     Assessment/Plan Principal Problem:   Hematemesis Active Problems:   DM2 (diabetes mellitus, type 2) (HCC)   HLD (hyperlipidemia)   Chronic kidney disease, stage III (moderate) (HCC)   Gastroparesis   History of pulmonary embolism   Acute GI bleeding    Intractable nausea vomiting with hematemesis -   CT scan was unremarkable.  Vomiting likely from gastroparesis.  Hematemesis could be a Mallory-Weiss tear.  EGD done and September 2024 showed mild gastritis.  Did consult GI.  Will check serial CBC.  Keep patient n.p.o. for now.  Antiemetics.  Protonix IV. Possible UTI with gas in the urinary bladder and UA shows abnormality follow urine cultures on ceftriaxone. History of PE diagnosed in December 2024 was off Eliquis for last 4 days in preparation for dental procedure.  Will restart once patient CBC and no further GI bleed. Diabetes mellitus type 2 takes Lantus 40 units in the morning along with metformin and Ozempic.  Last hemoglobin A1c was 7.4 in November 2024.  Presently NPO.  On sliding scale coverage.  Restart Lantus once patient is able to eat or if CBG shows increasing  trend. Hypertension takes Coreg and amlodipine. Chronic kidney disease stage III creatinine at her baseline. Hyperlipidemia on statins.  Since patient has intractable nausea vomiting with hematemesis will need close monitoring and more than 2 midnight stay.   DVT prophylaxis: TED hose. Code Status: Full code. Family Communication: Discussed with patient. Disposition Plan: Medical floor. Consults called: Gastroenterologist. Admission status: Observation.

## 2023-04-29 NOTE — Consult Note (Signed)
 Consultation Note   Referring Provider:  [facility] PCP: Marisue Brooklyn Primary Gastroenterologist::  Imogene Burn, MD ( inpatient only). Prior to that was followed by [facility]       Reason for Consultation:  N/V and hematemesis DOA: 04/28/2023         Hospital Day: 2   ASSESSMENT    58 y.o. year old female with a history of gastroparesis admitted with nausea, vomiting, reported hematemesis on Eliquis. Blood in emesis could be secondary to erosive esophagitis , possibly Mallory-Weiss tear .  VSS except mild tachycardia ( resolved). Hgb 14.5 ( above her baseline) but suspect it will decline some with IV fluids.   Gastroparesis,diagnosed after food in stomach on EGD x 2. .  Takes Reglan once daily and generally does okay  GERD / history of reflux esophagitis Takes daily Pantoprazole. Also takes Carafate once daily  ( most days)  PE, on Eliquis Hasn't taken in 4 days due to need for dental work  UTI Getting Rocephin  DM2 HTN Cardiomyopathy.  CKD 3 Anxiety  See PMH for additional history    PLAN:   --Monitor H/H --Continue BID IV Protonix --Keep NPO for now.  --Continue IVF --Zofran prn --Will possibly need EGD --Eventual outpatient screening colonoscopy  HPI   Matthew was seen in hospital by [place] late Sept 2024 for nausea, vomiting, abdominal pain , reported black emesis. EGD remarkable for reflux esophagitis, gastritis and food in stomach. She has not had a screening colonoscopy. One was arranged outpatient in our office but she cancelled procedure.   Patient presented to ED yesterday for vomiting and LUQ pain . Symptoms started on Saturday. Emesis was  yellow initially. On Monday and Tuesday she had several episodes of bloody emesis. No blood in stools or black stools.  In ED she was not hypotensive but with mild tachycardia at times.  Presenting hgb 14.5 / 43.3 % HCT. Mildly elevated BUN as well  as creatinine. Glucose was 309. Found to have a UTI. CT angio negative for active GI bleeding, no acute findings. She doesn't take any NSAIDs. Last dose of Eliquis was a few days ago. For gastroparesis she takes Reglan once daily, Carafate once daily. She takes daily pantoprazole for GERD.     Labs and Imaging:  Recent Labs    04/28/23 2120  WBC 8.3  HGB 14.5  HCT 43.3  MCV 84.2  PLT 339   No results for input(s): "FOLATE", "VITAMINB12", "FERRITIN", "TIBC", "IRONPCTSAT" in the last 72 hours. Recent Labs    04/28/23 2120  NA 136  K 4.0  CL 98  CO2 20*  GLUCOSE 309*  BUN 25*  CREATININE 1.41*  CALCIUM 9.7   Recent Labs    04/28/23 2120  PROT 8.5*  ALBUMIN 3.9  AST 28  ALT 19  ALKPHOS 97  BILITOT 0.8   No results for input(s): "INR" in the last 72 hours. No results for input(s): "AFPTUMOR" in the last 72 hours.  CT ANGIO GI BLEED CLINICAL DATA:  Left upper quadrant pain, vomiting blood, low appetite, chronic mesenteric ischemia  EXAM: CTA ABDOMEN AND PELVIS WITHOUT AND WITH CONTRAST  TECHNIQUE: Multidetector CT imaging of the abdomen and pelvis was performed  using the standard protocol during bolus administration of intravenous contrast. Multiplanar reconstructed images and MIPs were obtained and reviewed to evaluate the vascular anatomy.  RADIATION DOSE REDUCTION: This exam was performed according to the departmental dose-optimization program which includes automated exposure control, adjustment of the mA and/or kV according to patient size and/or use of iterative reconstruction technique.  CONTRAST:  75mL OMNIPAQUE IOHEXOL 350 MG/ML SOLN  COMPARISON:  02/02/2023  FINDINGS: VASCULAR  Scattered calcified atherosclerotic plaque the aorta and its mesenteric, renal, and iliac artery branches without hemodynamically significant stenosis. No aortic aneurysm or dissection.  Review of the MIP images confirms the above findings.  NON-VASCULAR  Lower  chest: No acute abnormality.  Hepatobiliary: Cholecystectomy. No biliary dilation. Unremarkable liver.  Pancreas: Unremarkable.  Spleen: Unremarkable.  Adrenals/Urinary Tract: Normal adrenal glands. No urinary calculi or hydronephrosis. Mild diffuse bladder wall thickening. Gas within the anti dependent bladder.  Stomach/Bowel: No bowel obstruction or bowel wall thickening. No evidence of active GI bleeding. Stomach and appendix are within normal limits.  Lymphatic: No lymphadenopathy.  Pelvic lymph node dissection.  Reproductive: Postoperative change of partial hysterectomy. No adnexal mass.  Other: No free intraperitoneal fluid or air.  Musculoskeletal: No acute fracture.  IMPRESSION: 1. No evidence of active GI bleeding. No evidence of mesenteric ischemia. 2. Mild diffuse bladder wall thickening with gas within the anti dependent bladder. Correlate with recent instrumentation and urinalysis to exclude cystitis. 3.  Aortic Atherosclerosis (ICD10-I70.0).  Electronically Signed   By: Minerva Fester M.D.   On: 04/29/2023 00:05   Echo Dec 2024 Left Ventricle: Left ventricular ejection fraction, by estimation, is  50%. Left ventricular ejection fraction by 3D volume is 47 %.   Previous GI Evaluations   EGD Sept 2024 - Esophageal mucosal variant. Biopsied. - A medium amount of food (residue) in the stomach. - Gastritis. Biopsied. - Normal examined duodenum. Biopsied.   A. DUODENUM, BIOPSY:  Benign duodenal mucosa with no diagnostic abnormality   B. STOMACH, BIOPSY:  Reactive gastropathy with mild chronic gastritis  Negative for H. pylori, intestinal metaplasia, dysplasia and carcinoma  (see comment)   C. ESOPHAGUS, BIOPSY:  Reactive squamous mucosa with changes suggestive of reflux esophagitis  Negative for glandular epithelium, eosinophils, dysplasia and carcinoma   Past Medical History:  Diagnosis Date   Abdominal pain with vomiting    ABDOMINAL  PAIN-EPIGASTRIC 09/14/2009   Qualifier: Diagnosis of   By: Monica Becton PA-c, [name]        Abnormal CT scan 11/05/2022   Abnormal Pap smear of cervix    Abscess of left lower leg    Acute cystitis 07/07/2017   Acute kidney injury superimposed on chronic kidney disease (HCC) 04/22/2021   Acute pulmonary embolism (HCC) 01/30/2023   Acute pyelonephritis 08/02/2022   Acute renal failure superimposed on stage 3a chronic kidney disease (HCC) 11/04/2022   Acute respiratory failure with hypoxia (HCC) 01/31/2023   ANKLE PAIN, LEFT 12/25/2009   Qualifier: Diagnosis of   By: Delrae Alfred MD, [name]         Anxiety 01/22/2016   Asthma    Asthma without acute exacerbation 01/31/2023   CAD (coronary artery disease) 07/07/2017   Cardiac murmur 02/01/2019   Cardiomegaly 01/31/2023   Cardiomyopathy, secondary (HCC) 09/06/2009   Qualifier: Diagnosis of  By: Jolene Provost   Formatting of this note might be different from the original. Overview:  Qualifier: Diagnosis of  By: Jolene Provost   Cardiomyopathy, unspecified (HCC) 09/06/2009   Formatting  of this note might be different from the original.  Overview:   Overview:   Qualifier: Diagnosis of   By: Jolene Provost     Overview:   Overview:   Qualifier: Diagnosis of   By: Jolene Provost  Formatting of this note might be different from the original.  Overview:   Qualifier: Diagnosis of   By: Jolene Provost     Cataract    Cellulitis of left leg 04/22/2021   Cerebrovascular disease 09/13/2012   Cervical cancer (HCC) 2001   Radical hysterectomy in [facility]   CHEST PAIN, ATYPICAL 07/30/2009   Qualifier: Diagnosis of   By: Delrae Alfred MD, Beverely Low of this note might be different from the original.  Overview:   Qualifier: Diagnosis of   By: Delrae Alfred MD, [name]     Chest pain, unspecified 04/22/2021   Chronic back pain    Chronic diastolic HF (heart failure) (HCC)  05/21/2021   Chronic kidney disease, stage III (moderate) (HCC) 05/23/2014   Chronic pain syndrome 01/23/2016   Coffee ground emesis 11/05/2022   Coronary artery disease    30% lesions noted   Current use of insulin (HCC) 05/23/2014   Dehydration 08/02/2022   Depression    Diabetes type 2, uncontrolled 05/23/2014   Diabetic gastroparesis associated with type 1 diabetes mellitus (HCC) 11/27/2016   DIABETIC PERIPHERAL NEUROPATHY 09/21/2009   Qualifier: Diagnosis of  By: Delrae Alfred MD, [name]     Diabetic peripheral neuropathy (HCC) 01/23/2016   DM2 (diabetes mellitus, type 2) (HCC) 07/30/2009   Qualifier: Diagnosis of   By: Delrae Alfred MD, [name]         Dyslipidemia 02/01/2019   Dysuria 12/25/2009   Qualifier: Diagnosis of   By: Delrae Alfred MD, [name]         Essential hypertension 08/30/2009   Qualifier: Diagnosis of  By: Cristela Felt, CNA, Christy     Gastric atony 07/30/2009   Formatting of this note might be different from the original.  Overview:   Qualifier: Diagnosis of   By: Delrae Alfred MD, [name]     Gastroesophageal reflux disease 07/30/2009   Formatting of this note might be different from the original. Overview:  Overview:  Qualifier: Diagnosis of  By: Delrae Alfred MD, Alfonso Patten: Diagnosis of  By: Wilmon Pali NP, Consuela Mimes of this note might be different from the original. Overview:  Qualifier: Diagnosis of  By: Delrae Alfred MD, Alfonso Patten: Diagnosis of  By: Wilmon Pali NP, Gunnar Fusi   Gastroparesis 07/30/2009   Qualifier: Diagnosis of   By: Delrae Alfred MD, [name]         GERD 07/30/2009   Qualifier: Diagnosis of   By: Delrae Alfred MD, Alfonso Patten: Diagnosis of   By: Wilmon Pali NP, Gunnar Fusi         GERD (gastroesophageal reflux disease)    High anion gap metabolic acidosis 08/02/2022   History of cervical cancer 08/17/2015   Status post partial hysterectomy  Formatting of this note might be different from the original. Status post partial hysterectomy   HLD  (hyperlipidemia) 07/30/2009   Qualifier: Diagnosis of   By: Delrae Alfred MD, [name]         Hyperlipidemia    Hyperlipidemia, unspecified 07/30/2009   Formatting of this note might be different from the original.  Overview:   Qualifier: Diagnosis of   By: Delrae Alfred MD, Beverely Low of this note might be  different from the original.  Overview:   Qualifier: Diagnosis of   By: Delrae Alfred MD, [name]     Hypertension    Hypertensive urgency 08/30/2009   Qualifier: Diagnosis of   By: Cristela Felt, CNA, Christy         Hypomagnesemia 01/31/2023   INSOMNIA 09/21/2009   Qualifier: Diagnosis of  By: Delrae Alfred MD, [name]     Intractable nausea and vomiting 08/03/2022   Lumbar facet arthropathy 05/14/2016   Lumbar spinal stenosis 02/11/2018   Metatarsalgia of both feet 03/11/2017   Migraine 09/13/2012   Overview:  IMPRESSION: possible abd migraine   Mood disorder (HCC) 01/05/2022   Nausea & vomiting 07/07/2017   Nausea with vomiting, unspecified 09/14/2009   Qualifier: Diagnosis of   By: Myrtie Hawk, [name]        Formatting of this note might be different from the original.  Overview:   Qualifier: Diagnosis of   By: Myrtie Hawk, [name]     Neuropathy 07/20/2017   Overview:   Diabetic     Formatting of this note might be different from the original.  Diabetic     Nonspecific abnormal findings on radiological and examination of skull and head 09/13/2012   Normocytic anemia 01/31/2023   Osteomyelitis of second toe of left foot (HCC) 01/05/2022   Overweight (BMI 25.0-29.9) 07/21/2014   Pain of left calf 04/19/2021   PUD (peptic ulcer disease) 09/28/2009   Qualifier: Diagnosis of   By: Wilmon Pali NP, Kim Oki         Pure hypercholesterolemia 11/27/2016   Retention of urine 06/25/2011   Sepsis due to cellulitis (HCC) 04/22/2021   Severe sepsis (HCC) 04/22/2021   SIRS (systemic inflammatory response syndrome) (HCC) 05/21/2021   Thrombocytosis 01/31/2023   TOBACCO ABUSE 09/21/2009   Qualifier:  Diagnosis of  By: Delrae Alfred MD, Beverely Low of this note might be different from the original. Overview:  Qualifier: Diagnosis of  By: Delrae Alfred MD, Claybon Jabs, SERUM, ELEVATED 09/21/2009   Qualifier: Diagnosis of   By: Delrae Alfred MD, [name]         Trigeminal neuralgia    Tuberculin test reaction 07/30/2009   Formatting of this note might be different from the original.  Overview:   Annotation: CXR clear ?  diagnosed in 2/11?  On INH/pyridoxine  Qualifier: Diagnosis of   By: Delrae Alfred MD, [name]     Type 2 diabetes mellitus with hyperglycemia (HCC) 07/30/2009   Qualifier: Diagnosis of   By: Delrae Alfred MD, Beverely Low of this note might be different from the original.  Overview:   Qualifier: Diagnosis of   By: Delrae Alfred MD, [name]     Type 2 diabetes mellitus with stage 3 chronic kidney disease (HCC) 09/14/2009   Qualifier: Diagnosis of   By: Myrtie Hawk, [name]        Uncontrolled type 2 diabetes mellitus with hyperglycemia, without long-term current use of insulin (HCC) 04/22/2021   UTI (urinary tract infection) 09/21/2009   Qualifier: Diagnosis of   By: Delrae Alfred MD, Adele Dan, CANDIDAL 12/25/2009   Qualifier: Diagnosis of  By: Delrae Alfred MD, [name]     Viral upper respiratory tract infection 11/30/2020   Vitamin D deficiency 04/25/2013    Past Surgical History:  Procedure Laterality Date   ABDOMINAL HYSTERECTOMY     radical hysterectomy   AMPUTATION Left 01/06/2022   Procedure: AMPUTATION 2nd TOE;  Surgeon: Aldean Baker  V, MD;  Location: [facility] OR;  Service: Orthopedics;  Laterality: Left;   BIOPSY  11/06/2022   Procedure: BIOPSY;  Surgeon: Imogene Burn, MD;  Location: [facility] ENDOSCOPY;  Service: Gastroenterology;;   CHOLECYSTECTOMY     ESOPHAGOGASTRODUODENOSCOPY (EGD) WITH PROPOFOL N/A 11/06/2022   Procedure: ESOPHAGOGASTRODUODENOSCOPY (EGD) WITH PROPOFOL;  Surgeon: Imogene Burn, MD;  Location: [facility] ENDOSCOPY;  Service:  Gastroenterology;  Laterality: N/A;   I & D EXTREMITY Left 04/24/2021   Procedure: LEFT LEG DEBRIDEMENT;  Surgeon: Nadara Mustard, MD;  Location: [facility] OR;  Service: Orthopedics;  Laterality: Left;   I & D EXTREMITY Left 05/01/2021   Procedure: LEFT KNEE DEBRIDEMENT;  Surgeon: Nadara Mustard, MD;  Location: [facility] OR;  Service: Orthopedics;  Laterality: Left;    Family History  Problem Relation Age of Onset   Breast cancer Mother    Stroke Mother    Hypertension Mother    Diabetes Mother    Hypertension Father    Diabetes Father    Heart attack Father    Hypertension Sister    Diabetes Sister    Hypertension Brother    Diabetes Brother    Hypertension Brother    Diabetes Brother    Stroke Maternal Grandmother    Colon cancer Neg Hx    Colon polyps Neg Hx    Esophageal cancer Neg Hx    Stomach cancer Neg Hx    Rectal cancer Neg Hx     Prior to Admission medications   Medication Sig Start Date End Date Taking? Authorizing Provider  albuterol (VENTOLIN HFA) 108 (90 Base) MCG/ACT inhaler Inhale 2 puffs into the lungs every 6 (six) hours as needed for wheezing or shortness of breath. 08/03/20  Yes Saguier, Ramon Dredge, PA-C  amLODipine (NORVASC) 5 MG tablet Take 1 tablet (5 mg total) by mouth daily. 11/17/22 11/12/23 Yes Saguier, Ramon Dredge, PA-C  apixaban (ELIQUIS) 5 MG TABS tablet Take 1 tablet (5 mg total) by mouth 2 (two) times daily. 04/06/23  Yes Saguier, Ramon Dredge, PA-C  atorvastatin (LIPITOR) 80 MG tablet Take 1 tablet (80 mg total) by mouth daily. 01/20/23  Yes Saguier, Ramon Dredge, PA-C  baclofen (LIORESAL) 10 MG tablet Take 1 tablet (10 mg total) by mouth 3 (three) times daily as needed for spasms 02/17/23  Yes Saguier, Ramon Dredge, PA-C  busPIRone (BUSPAR) 15 MG tablet Take 1 tablet (15 mg total) by mouth 2 (two) times daily. 04/22/23  Yes Saguier, Ramon Dredge, PA-C  carvedilol (COREG) 25 MG tablet Take 1 tablet (25 mg total) by mouth 2 (two) times daily with a meal. 04/23/23  Yes Saguier, Ramon Dredge, PA-C   fluticasone furoate-vilanterol (BREO ELLIPTA) 100-25 MCG/INH AEPB Inhale 1 puff into the lungs daily. Patient taking differently: Inhale 1 puff into the lungs daily as needed (Asthma). 08/03/20  Yes Saguier, Ramon Dredge, PA-C  gabapentin (NEURONTIN) 300 MG capsule TAKE 2 CAPSULES BY MOUTH THREE TIMES DAILY 04/06/23  Yes Saguier, Ramon Dredge, PA-C  Insulin Glargine (BASAGLAR KWIKPEN) 100 UNIT/ML Inject 25 Units into the skin at bedtime. 02/17/23  Yes Saguier, Ramon Dredge, PA-C  metFORMIN (GLUCOPHAGE-XR) 500 MG 24 hr tablet Take 1 tablet (500 mg total) by mouth daily with breakfast. 11/17/22  Yes Saguier, Ramon Dredge, PA-C  nitroGLYCERIN (NITROSTAT) 0.4 MG SL tablet Place 1 tablet (0.4 mg total) under the tongue every 5 (five) minutes as needed for chest pain. 03/21/21  Yes Saguier, Ramon Dredge, PA-C  omeprazole (PRILOSEC) 40 MG capsule Take 1 capsule (40 mg total) by mouth daily. 02/09/23  Yes Jacalyn Lefevre, MD  Oxycodone HCl 10 MG TABS Take 1 tablet (10 mg total) by mouth 3 (three) times daily as needed for severe pain 02/17/23  Yes Saguier, Ramon Dredge, PA-C  OZEMPIC, 0.25 OR 0.5 MG/DOSE, 2 MG/3ML SOPN Inject 0.5 mg into the skin once a week. mondays 10/14/22  Yes [provider]  pantoprazole (PROTONIX) 40 MG tablet Take 1 tablet (40 mg total) by mouth 2 (two) times daily. 11/08/22 04/28/24 Yes Ghimire, Lyndel Safe, MD  promethazine (PHENERGAN) 12.5 MG tablet TAKE 1 TABLET BY MOUTH EVERY 8 HOURS AS NEEDED FOR NAUSEA FOR VOMITING 04/13/23  Yes Saguier, Ramon Dredge, PA-C  topiramate (TOPAMAX) 50 MG tablet Take 1 tablet (50 mg total) by mouth 2 (two) times daily. Patient taking differently: Take 50 mg by mouth 2 (two) times daily as needed (headache). 01/13/22  Yes Saguier, Ramon Dredge, PA-C  VITAMIN D PO Take 5,000 Units by mouth daily.   Yes [provider]  Insulin Pen Needle (PEN NEEDLES 31GX5/16") 31G X 8 MM MISC Use daily as directed 03/28/22   Saguier, Ramon Dredge, PA-C    Current Facility-Administered Medications  Medication Dose Route  Frequency Provider Last Rate Last Admin   cefTRIAXone (ROCEPHIN) 1 g in sodium chloride 0.9 % 100 mL IVPB  1 g Intravenous Q24H Eduard Clos, MD       diphenhydrAMINE (BENADRYL) injection 25 mg  25 mg Intravenous Q6H PRN Eduard Clos, MD       insulin aspart (novoLOG) injection 0-9 Units  0-9 Units Subcutaneous Q4H Eduard Clos, MD   2 Units at 04/29/23 0981   lactated ringers infusion   Intravenous Continuous Eduard Clos, MD 75 mL/hr at 04/29/23 0727 New Bag at 04/29/23 0727   pantoprazole (PROTONIX) injection 40 mg  40 mg Intravenous Q12H Eduard Clos, MD       prochlorperazine (COMPAZINE) injection 5 mg  5 mg Intravenous Q6H PRN Eduard Clos, MD       Current Outpatient Medications  Medication Sig Dispense Refill   albuterol (VENTOLIN HFA) 108 (90 Base) MCG/ACT inhaler Inhale 2 puffs into the lungs every 6 (six) hours as needed for wheezing or shortness of breath. 18 g 2   amLODipine (NORVASC) 5 MG tablet Take 1 tablet (5 mg total) by mouth daily. 90 tablet 3   apixaban (ELIQUIS) 5 MG TABS tablet Take 1 tablet (5 mg total) by mouth 2 (two) times daily. 60 tablet 4   atorvastatin (LIPITOR) 80 MG tablet Take 1 tablet (80 mg total) by mouth daily. 90 tablet 3   baclofen (LIORESAL) 10 MG tablet Take 1 tablet (10 mg total) by mouth 3 (three) times daily as needed for spasms 30 each 3   busPIRone (BUSPAR) 15 MG tablet Take 1 tablet (15 mg total) by mouth 2 (two) times daily. 60 tablet 4   carvedilol (COREG) 25 MG tablet Take 1 tablet (25 mg total) by mouth 2 (two) times daily with a meal. 180 tablet 0   fluticasone furoate-vilanterol (BREO ELLIPTA) 100-25 MCG/INH AEPB Inhale 1 puff into the lungs daily. (Patient taking differently: Inhale 1 puff into the lungs daily as needed (Asthma).) 100 each 2   gabapentin (NEURONTIN) 300 MG capsule TAKE 2 CAPSULES BY MOUTH THREE TIMES DAILY 270 capsule 0   Insulin Glargine (BASAGLAR KWIKPEN) 100 UNIT/ML Inject 25  Units into the skin at bedtime. 15 mL 2   metFORMIN (GLUCOPHAGE-XR) 500 MG 24 hr tablet Take 1 tablet (500 mg total) by mouth daily with breakfast. 60 tablet  2   nitroGLYCERIN (NITROSTAT) 0.4 MG SL tablet Place 1 tablet (0.4 mg total) under the tongue every 5 (five) minutes as needed for chest pain. 30 tablet 3   omeprazole (PRILOSEC) 40 MG capsule Take 1 capsule (40 mg total) by mouth daily. 30 capsule 0   Oxycodone HCl 10 MG TABS Take 1 tablet (10 mg total) by mouth 3 (three) times daily as needed for severe pain 90 tablet 0   OZEMPIC, 0.25 OR 0.5 MG/DOSE, 2 MG/3ML SOPN Inject 0.5 mg into the skin once a week. mondays     pantoprazole (PROTONIX) 40 MG tablet Take 1 tablet (40 mg total) by mouth 2 (two) times daily. 60 tablet 0   promethazine (PHENERGAN) 12.5 MG tablet TAKE 1 TABLET BY MOUTH EVERY 8 HOURS AS NEEDED FOR NAUSEA FOR VOMITING 20 tablet 0   topiramate (TOPAMAX) 50 MG tablet Take 1 tablet (50 mg total) by mouth 2 (two) times daily. (Patient taking differently: Take 50 mg by mouth 2 (two) times daily as needed (headache).) 60 tablet 12   VITAMIN D PO Take 5,000 Units by mouth daily.     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  Tobacco Use   Smoking status: Former    Current packs/day: 0.00    Types: Cigarettes    Quit date: 1990    Years since quitting: 35.2   Smokeless tobacco: Never  Vaping Use   Vaping status:  Never Used  Substance and Sexual Activity   Alcohol use: Yes    Comment: occasionally   Drug use: No   Sexual activity: Yes    Partners: Male    Birth control/protection: Surgical    Comment: hysterectomy  Other Topics Concern   Not on file  Social History Narrative   Not on file   Social Drivers of Health   Financial Resource Strain: Not on file  Food Insecurity: Food Insecurity Present (01/31/2023)   Hunger Vital Sign    Worried About Running Out of Food in the Last Year: Sometimes true    Ran Out of Food in the Last Year: Sometimes true  Transportation Needs: No Transportation Needs (01/31/2023)   PRAPARE - Administrator, Civil Service (Medical): No    Lack of Transportation (Non-Medical): No  Physical Activity: Not on file  Stress: Not on file  Social Connections: Unknown (06/23/2021)   Received from [facility], [facility]   Social Network    Social Network: Not on file  Intimate Partner Violence: Not At Risk (01/31/2023)   Humiliation, Afraid, Rape, and Kick questionnaire    Fear of Current or Ex-Partner: No    Emotionally Abused: No    Physically Abused: No    Sexually Abused: No     Code Status   Code Status: Full Code  Review of Systems: All systems reviewed and negative except where  noted in HPI.  Physical Exam: Vital signs in last 24 hours: Temp:  [98.1 F (36.7 C)-98.5 F (36.9 C)] 98.2 F (36.8 C) (03/19 0817) Pulse Rate:  [83-116] 99 (03/19 0817) Resp:  [8-24] 24 (03/19 0817) BP: (112-165)/(66-131) 118/83 (03/19 0817) SpO2:  [95 %-100 %] 100 % (03/19 0817) Weight:  [79.4 kg] 79.4 kg (03/18 2102)    General:  Pleasant female in NAD Psych:  Cooperative. Normal mood and affect Eyes: Pupils equal Ears:  Normal auditory acuity Nose: No deformity, discharge or lesions Neck:  Supple, no masses felt Lungs:  Clear to auscultation.  Heart:  Regular rate, regular rhythm.  Abdomen:  Soft, nondistended, nontender, active bowel  sounds, no masses felt Rectal :  Deferred Msk: Symmetrical without gross deformities.  Neurologic:  Alert, oriented, grossly normal neurologically Extremities : No edema Skin:  Intact without significant lesions.    Intake/Output from previous day: No intake/output data recorded. Intake/Output this shift:  No intake/output data recorded.   Willette Cluster, NP-C   04/29/2023, 9:20 AM

## 2023-04-29 NOTE — Progress Notes (Signed)
 Notified [facility] pharmacist, Loralee Pacas, regarding the infiltration of pt's RFA PIV after pushing IV Benadryl and IV Compazine. Per Denny Peon there is no antidotes needed for either medications. RFA PIV removed, heat pack applied and affected arm elevated.

## 2023-04-29 NOTE — Hospital Course (Addendum)
 Same day note   [name] is a 58 y.o. female with history of diabetes mellitus type 2, hypertension, hyperlipidemia, chronic kidney disease stage III with diagnosis of PE in December 2024 presented to hospital with nausea vomiting and abdominal discomfort since yesterday.  History of gastroparesis in September 2024 and was admitted for the same.  Patient stated that she had some yellowish vomitus initially followed by bloody vomitus.  Patient has been on Eliquis since December 2024 but had not taken it last 4 days due to appointment with dentist.  In the ED CT angiogram of the abdomen pelvis did not show any active bleeding.  Hemoglobin was 14.5.  Creatinine 1.4.  Lipase was 41.  Patient was then admitted hospital with intractable nausea vomiting and hematemesis.   Patient seen and examined at bedside.  Patient was admitted to the hospital for nausea vomiting, hematemesis.  At the time of my evaluation, patient complains of abdominal pain  Physical examination reveals  Laboratory data and imaging was reviewed  Assessment and Plan.  Intractable nausea vomiting with hematemesis -   Gastroparesis.  CT scan of the abdomen was negative for acute bleeding.  Likely Mallory-Weiss tear.  Of note patient had the EGD done in September 2024 showed mild gastritis.  GI has been consulted.  Continue IV fluids n.p.o. antiemetics and Protonix.  Had mild tachycardia.  GI recommends monitoring H&H n.p.o. IV Protonix Zofran.  Might need EGD.  Continue IV fluids.  Possible UTI with gas in the urinary bladder and UA shows abnormal urinalysis.  Continue to follow urine cultures, on ceftriaxone.  History of PE diagnosed in December 2024 was off Eliquis for last 4 days in preparation for dental procedure.  Will restart once patient CBC and no further GI bleed.  Diabetes mellitus type 2 with hyperglycemia  takes Lantus 40 units in the morning along with metformin and Ozempic.  Last hemoglobin A1c was 7.4 in  November 2024.  Sliding scale insulin.  Currently NPO.  Will resume long-acting insulin at half dose while NPO.  Hypertension takes Coreg and amlodipine at home.  Currently NPO.  Will put the patient on IV hydralazine.  Chronic kidney disease stage III creatinine at her baseline.  Creatinine of 1.4 today.  Hyperlipidemia on statins.  Will hold for now.  No Charge  Signed,  Tenny Craw, MD [facility]

## 2023-04-29 NOTE — Progress Notes (Signed)
 Same day note   [name] is a 58 y.o. female with history of diabetes mellitus type 2, hypertension, hyperlipidemia, chronic kidney disease stage III with diagnosis of PE in December 2024 presented to hospital with nausea vomiting and abdominal discomfort since yesterday.  History of gastroparesis in September 2024 and was admitted for the same.  Patient stated that she had some yellowish vomitus initially followed by bloody vomitus.  Patient has been on Eliquis since December 2024 but had not taken it last 4 days due to appointment with dentist.  In the ED CT angiogram of the abdomen pelvis did not show any active bleeding.  Hemoglobin was 14.5.  Creatinine 1.4.  Lipase was 41.  Patient was then admitted hospital with intractable nausea vomiting and hematemesis.   Patient seen and examined at bedside.  Patient was admitted to the hospital for nausea vomiting, hematemesis.  At the time of my evaluation, patient complains of abdominal pain.  Denies vomiting since yesterday.  Physical examination reveals obese built female, anxious, tenderness over the epigastric region.  Laboratory data and imaging was reviewed  Assessment and Plan.  Intractable nausea vomiting with hematemesis -   Gastroparesis.  CTA scan of the abdomen was negative for acute bleeding.  Likely Mallory-Weiss tear.  Of note patient had the EGD done in September 2024 showed mild gastritis.  GI has been consulted.  Continue IV fluids n.p.o. antiemetics and Protonix.  Had mild tachycardia.  GI recommends monitoring H&H n.p.o. IV Protonix Zofran.  Might need EGD.  Continue IV fluids.  Possible UTI with gas in the urinary bladder and UA shows abnormal urinalysis with 21-50 white cells.  Continue to follow urine cultures, on IV ceftriaxone.  History of PE diagnosed in December 2024 was off Eliquis for last 4 days in preparation for dental procedure.  Will restart once patient CBC and no further GI bleed.  Diabetes mellitus  type 2 with hyperglycemia    Last hemoglobin A1c was 7.4 in November 2024.  Sliding scale insulin.  Currently NPO.  Will resume long-acting insulin at half dose while NPO.  Hypertension takes Coreg and amlodipine at home.  Currently NPO.  Will put the patient on IV hydralazine.  Chronic kidney disease stage III creatinine at her baseline.  Creatinine of 1.4 today.  Monitor BMP in AM.  Hyperlipidemia on statins.  Will hold for now.  Will change the patient to inpatient..  No Charge  Signed,  Tenny Craw, MD [facility]

## 2023-04-29 NOTE — ED Notes (Signed)
Ice chips provided at this time.

## 2023-04-29 NOTE — ED Provider Notes (Signed)
 Care assumed from Dr. Rhae Hammock, patient with episode of hematemesis and essentially negative CT scan possible UTI.  She has persistent tachycardia, will need to be admitted.  Case is discussed with Dr. Toniann Fail of [facility], who agrees to admit the patient.   Dione Booze, MD 04/29/23 [phone]

## 2023-04-29 NOTE — Telephone Encounter (Signed)
 Pt cancelled already.

## 2023-04-30 DIAGNOSIS — K920 Hematemesis: Secondary | ICD-10-CM | POA: Diagnosis not present

## 2023-04-30 DIAGNOSIS — K219 Gastro-esophageal reflux disease without esophagitis: Secondary | ICD-10-CM | POA: Diagnosis not present

## 2023-04-30 DIAGNOSIS — R1032 Left lower quadrant pain: Secondary | ICD-10-CM | POA: Diagnosis not present

## 2023-04-30 DIAGNOSIS — K3184 Gastroparesis: Secondary | ICD-10-CM | POA: Diagnosis not present

## 2023-04-30 LAB — GLUCOSE, CAPILLARY
Glucose-Capillary: 124 mg/dL — ABNORMAL HIGH (ref 70–99)
Glucose-Capillary: 152 mg/dL — ABNORMAL HIGH (ref 70–99)
Glucose-Capillary: 200 mg/dL — ABNORMAL HIGH (ref 70–99)
Glucose-Capillary: 70 mg/dL (ref 70–99)
Glucose-Capillary: 88 mg/dL (ref 70–99)
Glucose-Capillary: 95 mg/dL (ref 70–99)
Glucose-Capillary: 95 mg/dL (ref 70–99)

## 2023-04-30 LAB — HEMOGLOBIN AND HEMATOCRIT, BLOOD
HCT: 38.3 % (ref 36.0–46.0)
Hemoglobin: 12.6 g/dL (ref 12.0–15.0)

## 2023-04-30 MED ORDER — HYDROXYZINE HCL 25 MG PO TABS
25.0000 mg | ORAL_TABLET | Freq: Four times a day (QID) | ORAL | Status: DC | PRN
  Administered 2023-04-30: 25 mg via ORAL
  Filled 2023-04-30: qty 1

## 2023-04-30 MED ORDER — HYDROMORPHONE HCL 1 MG/ML IJ SOLN
0.5000 mg | INTRAMUSCULAR | Status: DC | PRN
  Administered 2023-04-30 – 2023-05-01 (×6): 0.5 mg via INTRAVENOUS
  Filled 2023-04-30 (×5): qty 0.5

## 2023-04-30 NOTE — Progress Notes (Signed)
 Resources placed on AVS.

## 2023-04-30 NOTE — Progress Notes (Signed)
 PROGRESS NOTE  [name] WUX:[id number] DOB: 1965/06/19 DOA: 04/28/2023 PCP: Esperanza Richters, PA-C   LOS: 1 day   Brief narrative:  [name] is a 58 y.o. female with history of diabetes mellitus type 2, hypertension, hyperlipidemia, chronic kidney disease stage III with diagnosis of PE in December 2024 presented to hospital with nausea vomiting and abdominal discomfort since yesterday.  History of gastroparesis in September 2024 and was admitted for the same.  Patient stated that she had some yellowish vomitus initially followed by bloody vomitus.  Patient has been on Eliquis since December 2024 but had not taken it last 4 days due to appointment with dentist.  In the ED CT angiogram of the abdomen pelvis did not show any active bleeding.  Hemoglobin was 14.5.  Creatinine 1.4.  Lipase was 41.  Patient was then admitted hospital with intractable nausea vomiting and hematemesis.      Assessment/Plan: Principal Problem:   Hematemesis Active Problems:   DM2 (diabetes mellitus, type 2) (HCC)   HLD (hyperlipidemia)   Dyslipidemia   Chronic kidney disease, stage III (moderate) (HCC)   Gastroparesis   UTI (urinary tract infection)   Nausea & vomiting   Hypertension   Essential hypertension   History of pulmonary embolism   Acute GI bleeding   Intractable vomiting  Intractable nausea vomiting with hematemesis -   Gastroparesis.   CTA scan of the abdomen was negative for acute bleeding.  Likely Mallory-Weiss tear.  Has not had further episodes of vomiting since yesterday.  Patient is n.p.o. at this time.  Continues to have some abdominal discomfort.  Of note patient had the EGD done in September 2024 showed mild gastritis.  Communicated with GI and since patient with stable hemoglobin and no further emesis will be started on full liquids.  Will continue to monitor overnight.  Patient is hemodynamically stable, hemoglobin has remained stable as well.  Possible UTI with gas  in the urinary bladder and UA shows abnormal urinalysis with 21-50 white cells.  Empirically under IV Rocephin.  History of PE diagnosed in December 2024 was off Eliquis for last 4 days in preparation for dental procedure.  Will restart once patient CBC and no further GI bleed.   Diabetes mellitus type 2 with hyperglycemia    Last hemoglobin A1c was 7.4 in November 2024.  Sliding scale insulin.  Currently NPO.  Will resume long-acting insulin at half dose while NPO.  Latest POC glucose of 88.  Will start on full liquids.  Increase insulin coverage as needed.   Hypertension takes Coreg and amlodipine at home.   Currently on IV hydralazine.  Latest blood pressure of 113/83   Chronic kidney disease stage III creatinine at her baseline.  Creatinine of 1.1 from 1.4 today.  Monitor BMP in AM.   Hyperlipidemia on statins.  Will hold for now.   DVT prophylaxis: Place TED hose Start: 04/29/23 0646   Disposition: Likely home tomorrow if hemoglobin remained stable and no further emesis.  Status is: Inpatient  Remains inpatient appropriate because: Pending clinical improvement,    Code Status:     Code Status: Full Code  Family Communication: None at bedside  Consultants: GI  Procedures: None yet  Anti-infectives:  Rocephin IV  Anti-infectives (From admission, onward)    Start     Dose/Rate Route Frequency Ordered Stop   04/29/23 2200  cefTRIAXone (ROCEPHIN) 1 g in sodium chloride 0.9 % 100 mL IVPB        1  g 200 mL/hr over 30 Minutes Intravenous Every 24 hours 04/29/23 0659     04/29/23 0030  cefTRIAXone (ROCEPHIN) 1 g in sodium chloride 0.9 % 100 mL IVPB        1 g 200 mL/hr over 30 Minutes Intravenous  Once 04/29/23 0015 04/29/23 0203        Subjective: Today, patient was seen and examined at bedside.  Patient denies any dizziness nausea vomiting since yesterday.  Feels okay.  Still complains of some abdominal discomfort.  Has not had a bowel  movement.  Objective: Vitals:   04/30/23 0500 04/30/23 0740  BP: 125/70 113/83  Pulse: 75 79  Resp:  17  Temp:  97.9 F (36.6 C)  SpO2: 97% 94%   No intake or output data in the 24 hours ending 04/30/23 1331 Filed Weights   04/28/23 2102  Weight: 79.4 kg   Body mass index is 23.73 kg/m.   Physical Exam:  GENERAL: Patient is alert awake and oriented. Not in obvious distress. HENT: No scleral pallor or icterus. Pupils equally reactive to light. Oral mucosa is moist NECK: is supple, no gross swelling noted. CHEST: Clear to auscultation. No crackles or wheezes.  Diminished breath sounds bilaterally. CVS: S1 and S2 heard, no murmur. Regular rate and rhythm.  ABDOMEN: Soft, mild epigastric discomfort., bowel sounds are present. EXTREMITIES: No edema. CNS: Cranial nerves are intact. No focal motor deficits. SKIN: warm and dry without rashes.  Data Review: I have personally reviewed the following laboratory data and studies,  CBC: Recent Labs  Lab 04/28/23 2120 04/29/23 1311 04/30/23 0648  WBC 8.3  --   --   NEUTROABS 6.8  --   --   HGB 14.5 12.4 12.6  HCT 43.3 38.1 38.3  MCV 84.2  --   --   PLT 339  --   --    Basic Metabolic Panel: Recent Labs  Lab 04/28/23 2120 04/29/23 1312  NA 136 142  K 4.0 3.8  CL 98 104  CO2 20* 29  GLUCOSE 309* 118*  BUN 25* 19  CREATININE 1.41* 1.10*  CALCIUM 9.7 9.1   Liver Function Tests: Recent Labs  Lab 04/28/23 2120 04/29/23 1312  AST 28 16  ALT 19 13  ALKPHOS 97 86  BILITOT 0.8 0.6  PROT 8.5* 7.6  ALBUMIN 3.9 3.4*   Recent Labs  Lab 04/28/23 2120  LIPASE 41   No results for input(s): "AMMONIA" in the last 168 hours. Cardiac Enzymes: No results for input(s): "CKTOTAL", "CKMB", "CKMBINDEX", "TROPONINI" in the last 168 hours. BNP (last 3 results) No results for input(s): "BNP" in the last 8760 hours.  ProBNP (last 3 results) No results for input(s): "PROBNP" in the last 8760 hours.  CBG: Recent Labs  Lab  04/30/23 0030 04/30/23 0501 04/30/23 0621 04/30/23 0736 04/30/23 1149  GLUCAP 200* 70 95 95 88   No results found for this or any previous visit (from the past 240 hours).   Studies: CT ANGIO GI BLEED Result Date: 04/29/2023 CLINICAL DATA:  Left upper quadrant pain, vomiting blood, low appetite, chronic mesenteric ischemia EXAM: CTA ABDOMEN AND PELVIS WITHOUT AND WITH CONTRAST TECHNIQUE: Multidetector CT imaging of the abdomen and pelvis was performed using the standard protocol during bolus administration of intravenous contrast. Multiplanar reconstructed images and MIPs were obtained and reviewed to evaluate the vascular anatomy. RADIATION DOSE REDUCTION: This exam was performed according to the departmental dose-optimization program which includes automated exposure control, adjustment of the mA and/or  kV according to patient size and/or use of iterative reconstruction technique. CONTRAST:  75mL OMNIPAQUE IOHEXOL 350 MG/ML SOLN COMPARISON:  02/02/2023 FINDINGS: VASCULAR Scattered calcified atherosclerotic plaque the aorta and its mesenteric, renal, and iliac artery branches without hemodynamically significant stenosis. No aortic aneurysm or dissection. Review of the MIP images confirms the above findings. NON-VASCULAR Lower chest: No acute abnormality. Hepatobiliary: Cholecystectomy. No biliary dilation. Unremarkable liver. Pancreas: Unremarkable. Spleen: Unremarkable. Adrenals/Urinary Tract: Normal adrenal glands. No urinary calculi or hydronephrosis. Mild diffuse bladder wall thickening. Gas within the anti dependent bladder. Stomach/Bowel: No bowel obstruction or bowel wall thickening. No evidence of active GI bleeding. Stomach and appendix are within normal limits. Lymphatic: No lymphadenopathy.  Pelvic lymph node dissection. Reproductive: Postoperative change of partial hysterectomy. No adnexal mass. Other: No free intraperitoneal fluid or air. Musculoskeletal: No acute fracture. IMPRESSION: 1.  No evidence of active GI bleeding. No evidence of mesenteric ischemia. 2. Mild diffuse bladder wall thickening with gas within the anti dependent bladder. Correlate with recent instrumentation and urinalysis to exclude cystitis. 3.  Aortic Atherosclerosis (ICD10-I70.0). Electronically Signed   By: Minerva Fester M.D.   On: 04/29/2023 00:05      Joycelyn Das, MD  [facility] 04/30/2023  If 7PM-7AM, please contact night-coverage

## 2023-04-30 NOTE — Plan of Care (Signed)

## 2023-04-30 NOTE — Plan of Care (Signed)
  Problem: Coping: Goal: Ability to adjust to condition or change in health will improve Outcome: Progressing   Problem: Fluid Volume: Goal: Ability to maintain a balanced intake and output will improve Outcome: Progressing   Problem: Pain Managment: Goal: General experience of comfort will improve and/or be controlled Outcome: Progressing   Problem: Safety: Goal: Ability to remain free from injury will improve Outcome: Progressing

## 2023-04-30 NOTE — Progress Notes (Signed)
   04/30/23 1020  TOC Brief Assessment  Insurance and Status Reviewed  Patient has primary care physician Yes  Home environment has been reviewed home  Prior level of function: independent  Prior/Current Home Services No current home services  Social Drivers of Health Review SDOH reviewed no interventions necessary  Transition of care needs no transition of care needs at this time    Transition of Care Department [facility]) has reviewed patient and no TOC needs have been identified at this time. We will continue to monitor patient advancement through interdisciplinary progression rounds. If new patient transition needs arise, please place a TOC consult.

## 2023-04-30 NOTE — Discharge Instructions (Signed)
 [facility] assistance programs. If you are behind on your bills and expenses, and need some help to make it through a short term hardship or financial emergency, there are several organizations and charities in the [place] area that may be able to help. They range from the Pathmark Stores, Liberty Global, Landscape architect of Weyerhaeuser Company and the local community action agency, the Intel, Avnet. These groups may be able to provide you resources to help pay your utility bills, rent, and they even offer housing assistance.  Crisis assistance program Find help for paying your rent, electric bills, free food, and even funds to pay your mortgage. The Liberty Global [phone]) offers several services to local families, as funding allows. The Emergency Assistance Program (EAP), which they administer, provides household goods, free food, clothing, and financial aid to people in need in the [facility] area. The EAP program does have some qualification, and counselors will interview clients for financial assistance by written referral only. Referrals need to be made by the Department of Social Services or by other EAP approved human services agencies or charities in the area.  Money for resources for emergency assistance are available for security deposits for rent, water, electric, and gas, past due rent, utility bills, past due mortgage payments, food, and clothing. The Liberty Global also operates a Programme researcher, broadcasting/film/video on the site. More Liberty Global.  Open Door Ministries of [employer], which can be reached at [phone], offers emergency assistance programs for those in need of help, such as food, rent assistance, a soup kitchen, shelter, and clothing. They are based in [facility] but provide a number of services to those that qualify for assistance. Continue with Open Door Ministries  programs.  [facility] Department of Social Services may be able to offer temporary financial assistance and cash grants for paying rent and utilities. Help may be provided for local county residents who may be experiencing personal crisis when other resources, including government programs, are not available. Call [phone]  St. [place] Society, which is based in [place], provides financial assistance of up to $50.00 to help pay for rent, utilities, cooling bills, rent, and prescription medications. The program also provides secondhand furniture to those in need. [phone]  Mattel is a Geneticist, molecular. The organization can offer emergency assistance for paying rent, electric bills, utilities, food, household products and furniture. They offer extensive emergency and transitional housing for families, children and single women, and also run a Boy's and Dole Food. 301 Thrift Shops, CMS Energy Corporation, and other aid offered too. [address] [place], [place] [address], [phone]  Additional locations of the Pathmark Stores are in [place] and other nearby communities. When you have an emergency, need free food, money for basic needs, or just need assistance around Christmas, then the Pathmark Stores may have the resources you need. Or they can refer you to nearby agencies. Learn more.  [place] Low Income Risk manager - This is offered for [facility] families. The federal government created CIT Group Program provides a one-time cash grant payment to help eligible low-income families pay their electric and heating bills. [address], [place], [place] [address], [phone]  Government and Motorola - The county administers several emergency and self-sufficiency programs. Residents of [place]  can get help with energy bills and food, rent, and  other expenses. In addition,  work with a Sports coach who may be able to help you find a job or improve your employment skills. More [place] public assistance.  [place] Emergency Assistance - A program offers emergency utility and rent funds for greater [employer] area residents. The program can also provide counseling and referrals to charities and government programs. Also provides food and a free meal program that serves lunch Mondays - Saturdays and dinner seven days per week to individuals in the community. [address], [place], [place] [address], [phone]  Parker Hannifin - Offers affordable apartment and housing communities across [place] and [place]. The low income and seniors can access public housing, rental assistance to qualified applicants, and apply for the section 8 rent subsidy program. Other programs include Chiropractor and Engineer, maintenance. [address] [place], [place] [address], dial [phone].  Basic needs such as clothing - Low income families can receive free items (school supplies, clothes, holiday assistance, etc.) from clothing closets while more moderate income [address] families can shop at Caremark Rx. Locations across the area help the needy. Get information on [place] Triad free clothing centers.  The [facility] provides transitional housing to veterans and the disabled. Clients will also access other services too, including life skills classes, case management, and assistance in finding permanent housing. [address], [place], [place] [address], call [phone]  [place] Transitional Housing in [place] is for people who were just evicted or that are formerly homeless. The non-profit will also help then gain self-sufficiency, find a home or apartment to live in, and also provides information on rent assistance when needed. Dial [phone]  [employer] Partnership to End Homelessness is available in [place]. Families that were evicted or that are homeless can gain shelter, food, clothing, furniture, and also emergency financial assistance. Other services include financial skills and life skills coaching, job training, and case management. [address], [place], [place] [address]. Telephone [phone].  The Dynegy, Avnet. runs the Ford Motor Company. This can help people save money on their heating and summer cooling bills, and is free to low income families. Free upgrades can be made to your home. Phone [phone]  Many of the non-profits and programs mentioned above are all inclusive, meaning they can meet many needs of the low income, such as energy bills, food, rent, and more. However there are several organizations that focus just on rent and housing. Read more on rent assistance in [place] region.  Legal assistance for evictions, foreclosure, and more If you need free legal advice on civili issues, such as foreclosures, evictions, Electronics engineer, government programs, domestic issues and more, Armed forces operational officer Aid of [facility]) is a Associate Professor firm that provides free legal services and counsel to lower income people, seniors, disabled, and others. The goal is to ensure everyone has access to justice and fair representation.  Call them at [phone], or click here to learn more about [place] free legal assistance programs.  [place] Avnet and funds for emergency expenses The Pathmark Stores is another organization that can provide people with Deere & Company and funds to pay bills. Their assistance depends on funding, and the demand for help is always very high. They can provide cash to help pay rent, a missed mortgage payment, or gas, electric, and water bills. But the assistance doesn't stop there. They also have a food pantry  on site, which can provide  food once every three (3) months to people who need help. The KeyCorp can also offer a Engineering geologist once every three (3) months for a maximum three (3) times. After receiving this voucher over that period of time, applicants can receive this aid one every six (6) months after that. [phone].  Kohl's action agency The Intel, Avnet. offers job and Dispensing optician. Resources are focused on helping students obtain the skills and experiences that are necessary to compete in today's challenging and tight job market. The non-profit faith-based community action agency offers internship trainings as well as classroom instruction. Economically disadvantaged and challenged individuals and potential employers can use their services. Classes are tailored to meet the needs of people in the [facility] region. [place], [place] [address], [phone]    Foreclosure prevention services Housing Counseling and Education is also offered by MeadWestvaco of the [place]. The agency (phone number is below) is a Engineer, structural providing foreclosure advice and counseling. They offer mortgage resolution counseling and also reverse mortgage counseling. Counselors can direct people to both Kimberly-Clark, as well as Weyerhaeuser Company foreclosure assistance options.  Warehouse manager has locations in [place] and [employer]. They run debt and foreclosure prevention programs for local families. A sampling of the programs offered include both Budget and Housing Counseling. This includes money management, financial advice, budget review and development of a written action plan with a Pensions consultant to help solve specific individual financial problems. In addition, housing and mortgage counselors can also provide pre- and post-purchase  homeownership counseling, default resolution counseling (to prevent foreclosure) and reverse mortgage counseling. A Debt Management Program allows people and families with a high level of credit card or medical debt to consolidate and repay consumer debt and loans to creditors and rebuild positive credit ratings and scores. [phone]  Debt assistance programs Receive free counseling and debt help from [facility] of the [place]. The [facility] based agency can be reached at [phone]. The counselors provide free help, and the services include budget counseling. This will help people manage their expenses and set goals. They also offer a Forensic scientist, which will help individuals consolidate their debts and become debt free. Most of the workshops and services are free.  Community clinics in [place] of the leading health and dental centers are listed below. They may be able to provide medication, physicals, dental care, and general family care to residents of all incomes and backgrounds across the region. Some of the programs focus on the low income and underinsured. However if these clinics can't meet your needs, find information and details on more clinics in [facility].  Some of the options include Marriott of [employer]. This center provides free or low cost health care to low-income adults 18 - 64, who have no health insurance. Among other services offered include a pharmacy and eye clinic. Phone [phone]  [facility], which is located in [place], is a community clinic that provides primary medical and health care to uninsured and underinsured adults and families, as well as the low income, in the greater [place] area on a sliding-fee scale. Call [phone]  [place] Adult Dental Program - They run a dental assistance program that is organized by [facility] to provide dental services  and aid  to [employer] Energy. Services offered by the dental clinic are limited to extractions, pain management, and minor restorative care. [phone]  [place] Child Health has locations in [facility] and [place]. The community clinics provide complete pediatric care including primary health, mental health, social work, neurology, cardiology, asthma. Dial [phone].  In addition to those [address] and Safeway Inc, find other free community clinics in [place] and across the county.  Food pantry and assistance Some of the local food pantries and distribution centers to call for free food and groceries include The Hive of [place] (phone [phone]), The [facility] (phone [phone]) and also PPL Corporation. Dial [phone].  Several other food banks in the region provide clothing, free food and meals, access to soup kitchens and other help. Find the addresses and phone numbers of more food pantries in [place]. http://www.needhelppayingbills.com/html/[place]_assistance_pro.html  SUNDAYS BREAKFAST TWO LOCATIONS: 8:00am served in Nucor Corporation by Awaken PPL Corporation 8:30am SHUTTLE provided from [facility], served at Apache Corporation, [address]. LUNCH TWO LOCATIONS [plus one additional third Sunday only] 10:30am - 12:30pm served at Ecolab, Liberty Global, [place] [address] (1.2 miles from [facility]) 12:30pm served in [place] by Land O'Lakes Team (THIRD Sunday only) 1:30pm served at [facility] by [facility] one location [plus one additional third Sunday only] 5:00pm Every Sunday, served under the bridge at [address] by Lindell Noe Under the 3M Company (.7 miles from [facility]) (THIRD Sunday ONLY) 4:00pm served in the parking garage, across from Nucor Corporation, corner of [place] and [place] by Ryland Group Works  Ministries MONDAYS BREAKFAST 7:30am served in Nucor Corporation by the United States Steel Corporation and Friends LUNCH 10:30am - 12:30pm served at Ecolab, Liberty Global, [place] [address] (1.2 miles from [facility]) DINNER TWO LOCATIONS: 7:00pm served in front of the courthouse at the corner of Goldman Sachs and International Business Machines. by [facility] Monday Night Meal (3 blocks from [facility]) 4:30pm served at the AutoNation, [address] by The Procter & Gamble Not Bombs (0.6 miles from [facility]) [place] BREAKFAST 8:00am - 9:00am served at The TJX Companies, [address] (0.3 miles from [place]) LUNCH 10:30am - 12:30pm served at the Ecolab, Liberty Global [address] (1.2 miles from [place]) DINNER 6:00pm served at CSX Corporation, enter from Capital One and go to the Sonic Automotive, (0.7 miles from [place]) [facility] BREAKFAST 7:00am - 8:00am served at Ecolab, Liberty Global [address] (1.2 miles from [place]) LUNCH ONE LOCATION [plus two additional locations listed below] 10:30am - 12:30pm served at Ecolab, Liberty Global [address] (1.2 miles from [place]) (FIRST Wednesday ONLY) 11:30am served at Dillard's, [place] [address] (6.6 miles from [place]) (SECOND Wednesday ONLY) 11:00am served at [place]. Melvyn Novas of [address], [address] (1.3 miles from [place]) [place] TWO LOCATIONS 6:00pm served at W. R. Berkley, [place] W. Visteon Corporation. (1.3 miles from [facility]) 4:00pm - 6:00pm (hot dogs and chips) served at Levi Strauss of [facility], [address] (1.7 miles from [place]) [place] BREAKFAST NOT AVAILABLE AT THIS TIME LUNCH 10:30am - 12:30pm served at Ecolab, [place]  AT&T,  [phone] [address], (1.2 miles from [place]) DINNER 6:00pm served at CSX Corporation, enter from Capital One and go to the Sonic Automotive, (0.7 miles from Nucor Corporation) [place] BREAKFAST NOT AVAILABLE AT THIS TIME LUNCH 10:30am - 12:30pm served at Ecolab, Liberty Global [address] (1.2 miles from [place]) DINNER TWO LOCATIONS, [plus one additional first Friday only] 6:00pm served under the bridge at [address] by Lindell Noe Under CSX Corporation. (.7 miles from [facility]) 5:00pm - 7:00pm served at Levi Strauss of [facility], [address] (1.7 miles from [place]) (FIRST Friday ONLY) 5:45 pm - SHUTTLE provided from the LIBRARY at 5:45pm. Served at [facility], [address]. SATURDAYS BREAKFAST TWO LOCATIONS [plus one additional last Saturday only] 8:00am served at [facility] by Delphi 8:30am served at Pulte Homes, [address] Works. (2.2 miles from [facility]) (LAST Saturday ONLY) 8:30am served at Beazer Homes, [address] (5 miles from [place]) LUNCH 10:30am - 12:30pm served at Ecolab, Liberty Global [address] (1.2 miles from [facility]) DINNER 6:00pm served under the bridge at [address] by World Fuel Services Corporation (0.7 miles from Nucor Corporation)  DIRECTIONS FROM [place] TO ALL MEAL LOCATIONS The Bridge at [address] (.7 miles from [address]) [address] on [place]. Turn Right onto DIRECTV 433 ft. Continue onto [address] under bridge, about 500 ft. Courthouse (3 blocks from [facility]) [place] on [address]. Turn right on [place] 1 block to PPL Corporation (.5 miles from [place]) [place] on [place]. YRC Worldwide. past Brink's Company to EMCOR. Enter from Capital One and  go to the Affiliated Computer Services building W. R. Berkley [address] Visteon Corporation. (1.3 miles from [facility]) [address] on [place]. Turn Right onto W. [address]ead[address]. church will be on the Left. The TJX Companies [address]3 miles from [facility]) Go .3 miles on W. Friendly Destination is on your right Dillard's at ONEOK (6.6 miles from [address]) [address] on [place] toward W Friendly Turn right onto W Friendly Continue onto Alcoa Inc. Continue onto Toll Brothers. 5. Elesa Hacker is on right [facility] Conseco) [address] (.6 miles from [address]) [place] on [place]. [address] Turn Left onto [address] 0.3 miles Destination is on the Left. Mu[place] Chapel Black & Decker at American Express (5 miles from Nucor Corporation) 1. Head south on [address]. Turn right onto W Friendly Turn slightly left onto Quest Diagnostics Continue onto Quest Diagnostics Turn right at Barnes & Noble Continue to church on right New Birth Sounds of [facility] [address] (1.7 miles from [place]) [address] on [place] 1.4 miles [place] becomes [place]. Elm [address] Continue 0.6 miles and church will be on theright. [place] Guardian Life Insurance at [address] (2.5 miles from Nucor Corporation) Ophir provided from Massachusetts Mutual Life [place]] [place] on [place]. Elm toward Estée Lauder right onto Costco Wholesale left onto Henry Schein left onto Micron Technology [address] Southern Company (  2.2 miles from [facility]) [address] on [place] 1.4 miles [place] becomes [place]. Elm [address] Turn right onto W. [address] and church will be on the Left. Potter's House/[place] AT&T [address] (1.2 miles from [facility]) 1.Turn right onto [facility] 2.Turn left onto Rogue Jury 3.Reino [place] 4.Destination is on your right [place]. Melvyn Novas of [address] at General Mills (1.3 miles from The [facility]) [address] on [address] Turn left onto Genuine Parts right onto S. [place]. Continue onto KB Home	[place]. Turn left onto Smurfit-Stone Container. Turn right onto WellPoint.    Low Income Public Housing  [facility] [place] Supported Living Apts [phone] W. Joellyn Qu[address]., [facility] [phone]  [facility] [address] [place] [phone]  [facility] Ind [address] [place] [phone]  [facility] [address] [place]. [place] [phone]  North[place] Apts. [address] [place] [phone]  Parkside Apts.  [address], [place] [phone]  Dorothe Pea Homes [address], [place] [phone]  [facility] [address] Silvio Pate [phone]  [facility] [address], [place] [phone]  THP Apts. [address]  [facility] [phone]  [facility]  [address] [place] [phone]  [place] Apts.  [address] [facility] [address] Ginette Otto [phone]   [place] Apts. II [address] Ginette Otto [phone]  [place] Housing [address] Ginette Otto [phone] [place] [phone]  [facility] Apts. [address], Ginette Otto [phone]  [place] Apts. [address] [facility] [address] Ginette Otto [phone]  [facility] Apts. [address] [place] [phone]  Rockwell Automation Apts.  [address], [place] [phone]  [facility] Apts. [address], [place] [phone] Apts.  [address] [phone]  [facility] Apts. [address] [place] [phone]  [place] Apts.  [address] [facility] [phone] Guide Shelters The [place] "B3979455" is a great source of information about community services available.   Access by dialing 2-1-1 from anywhere in [place], or by website -  PooledIncome.pl.  [facility]) now has a full time staff to take referrals for all individuals needing shelter or housing placement. They do not do direct services or have beds, but are in charge of assessing and coordinating placement for individuals needing shelter. The phone number for coordinated entry is [phone].    Other Armed forces technical officer Number and Address  [facility] Housing for homeless and needy men with substance abuse issues 5 day Covid Quarantine [phone] N. [address] [place], [place]  Goldman Sachs of [place] Emergency assistance for General Mills only Ingram Micro Inc [phone] [address]. [address], [place]  Clara Brunswick Corporation of the [place] Domestic violence shelter for women and their children [phone] [place], [place]  [facility] Abuse Services Domestic violence shelter for women and their children Each family gets their own unit and can quarantine after admission. [phone] [place], [place]  Interactive Resource Center [facility]) / Resources for the CIGNA center for the homeless Information and referral to housing resources Counseling Showers Laundry Barbershop Phone bank Mailroom Computer lab Medical clinic Bike maintenance center 14 Day covid quarantine [phone] [address] [place], [place]  Open Golden West Financial - [place] Men's Shelter Emergency housing Food Emergency financial assistance Permanent supportive housing [phone] [address] [place], [place]  The Pathmark Stores Crisis assistance Medication Housing Food Utility assistance [phone] [address] [place], [place]   [phone] [address] [place], [place]  The Monsanto Company of [place]       Transitional housing Case  Chartered certified accountant assistance [phone] [address] [place], [place]  [facility] House, Pitney Bowes for adult men and women Can admit with MD clearance from the hospital after positive covid test.  Intake Hotline [phone] [address] [place], [place]  24-hour Crisis Line for those Facing Homelessness   Information and referral to community resources [phone]  [place] Electric and additional resources. Can admit with MD clearance after positive covid test.  Prefer online applications (blocked on [facility] computers)/ currently full. Will be able to do intake at the office starting in May.  [phone] Admin only location

## 2023-04-30 NOTE — Progress Notes (Signed)
 Daily Progress Note  DOA: 04/28/2023 Hospital Day: 3   Chief Complaint: emesis with blood  ASSESSMENT    58 y.o. year old female with a history of  gastroparesis, GERD,  DM, CKD, cardiomyopathy  Gastroparesis Chronic, episodic nausea / vomiting and LUQ pain Emesis with blood.  Hematemesis resolved. CT angio was negative. May have been 2/2 to erosive esophagitis or a Mallory-Weiss tear. Hgb stable at 12.6 ( the 14.5 on admission may have been falsely elevated from volume depletion as her baseline hgb is mid 12 range. She didn't report LUQ pain to me but mentioned it to Dr Doy Hutching on rounds earlier today. . CT angio on admission was negative except for possible cystitis. Described pain in LUQ when seen in Sept 2024.   Possible UTI On Rocephin  GERD / history of reflux esophagitis  Takes daily PPI at home.  Also takes Carafate once daily  ( most days). Told me she also takes Reglan once daily but not on home med list.   History of PE on Eliquis   PLAN   --If tolerates full liquids then advance to soft diet. Recommend small frequent meals given gastroparesis. Maybe home tomorrow.  --Okay to resume Eliquis tomorrow --Resume home Pantoprazole 40 mg daily upon discharge.  --She was previously followed by [facility]. Apparently wanted to transfer care to [place] after we saw her in the hospital in Sept. She was supposed to have a colonoscopy in Dec 2024 but cancelled it.  --Our office will contact her for a hospital follow up. --N/V is episodic but if increases in frequency then need to consider role of Ozempic    Interim History / Subjective    Some nausea but no vomiting today. No complaints at present but she complained of left sided abdominal pain when Dr. Doy Hutching was in earlier  Objective   Recent Labs    04/28/23 2120 04/29/23 1311 04/30/23 0648  WBC 8.3  --   --   HGB 14.5 12.4 12.6  HCT 43.3 38.1 38.3  MCV 84.2  --   --   PLT 339  --   --    No results  for input(s): "FOLATE", "VITAMINB12", "FERRITIN", "TIBC", "IRONPCTSAT" in the last 72 hours. Recent Labs    04/28/23 2120 04/29/23 1312  NA 136 142  K 4.0 3.8  CL 98 104  CO2 20* 29  GLUCOSE 309* 118*  BUN 25* 19  CREATININE 1.41* 1.10*  CALCIUM 9.7 9.1   Recent Labs    04/28/23 2120 04/29/23 1312  PROT 8.5* 7.6  ALBUMIN 3.9 3.4*  AST 28 16  ALT 19 13  ALKPHOS 97 86  BILITOT 0.8 0.6    Imaging:  CT ANGIO GI BLEED CLINICAL DATA:  Left upper quadrant pain, vomiting blood, low appetite, chronic mesenteric ischemia  EXAM: CTA ABDOMEN AND PELVIS WITHOUT AND WITH CONTRAST  TECHNIQUE: Multidetector CT imaging of the abdomen and pelvis was performed using the standard protocol during bolus administration of intravenous contrast. Multiplanar reconstructed images and MIPs were obtained and reviewed to evaluate the vascular anatomy.  RADIATION DOSE REDUCTION: This exam was performed according to the departmental dose-optimization program which includes automated exposure control, adjustment of the mA and/or kV according to patient size and/or use of iterative reconstruction technique.  CONTRAST:  75mL OMNIPAQUE IOHEXOL 350 MG/ML SOLN  COMPARISON:  02/02/2023  FINDINGS: VASCULAR  Scattered calcified atherosclerotic plaque the aorta and its mesenteric, renal, and iliac artery branches without hemodynamically significant  stenosis. No aortic aneurysm or dissection.  Review of the MIP images confirms the above findings.  NON-VASCULAR  Lower chest: No acute abnormality.  Hepatobiliary: Cholecystectomy. No biliary dilation. Unremarkable liver.  Pancreas: Unremarkable.  Spleen: Unremarkable.  Adrenals/Urinary Tract: Normal adrenal glands. No urinary calculi or hydronephrosis. Mild diffuse bladder wall thickening. Gas within the anti dependent bladder.  Stomach/Bowel: No bowel obstruction or bowel wall thickening. No evidence of active GI bleeding. Stomach and  appendix are within normal limits.  Lymphatic: No lymphadenopathy.  Pelvic lymph node dissection.  Reproductive: Postoperative change of partial hysterectomy. No adnexal mass.  Other: No free intraperitoneal fluid or air.  Musculoskeletal: No acute fracture.  IMPRESSION: 1. No evidence of active GI bleeding. No evidence of mesenteric ischemia. 2. Mild diffuse bladder wall thickening with gas within the anti dependent bladder. Correlate with recent instrumentation and urinalysis to exclude cystitis. 3.  Aortic Atherosclerosis (ICD10-I70.0).  Electronically Signed   By: Minerva Fester M.D.   On: 04/29/2023 00:05     Scheduled inpatient medications:   insulin aspart  0-9 Units Subcutaneous Q4H   insulin glargine  10 Units Subcutaneous QHS   pantoprazole (PROTONIX) IV  40 mg Intravenous Q12H   Continuous inpatient infusions:   cefTRIAXone (ROCEPHIN)  IV 1 g (04/29/23 2053)   PRN inpatient medications: hydrALAZINE, HYDROmorphone (DILAUDID) injection, metoCLOPramide (REGLAN) injection  Vital signs in last 24 hours: Temp:  [97.8 F (36.6 C)-98.7 F (37.1 C)] 97.9 F (36.6 C) (03/20 0740) Pulse Rate:  [75-91] 79 (03/20 0740) Resp:  [16-18] 17 (03/20 0740) BP: (112-132)/(70-93) 113/83 (03/20 0740) SpO2:  [94 %-98 %] 94 % (03/20 0740) Last BM Date : 04/27/23  Intake/Output Summary (Last 24 hours) at 04/30/2023 1517 Last data filed at 04/30/2023 1300 Gross per 24 hour  Intake 120 ml  Output --  Net 120 ml    Intake/Output from previous day: No intake/output data recorded. Intake/Output this shift: Total I/O In: 120 [P.O.:120] Out: -    Physical Exam:  General: Alert female in NAD Heart:  Regular rate and rhythm.  Pulmonary: Normal respiratory effort Abdomen: Soft, nondistended, nontender. Normal bowel sounds. Extremities: No lower extremity edema  Neurologic: Alert and oriented Psych: Pleasant. Cooperative     LOS: 1 day   Willette Cluster ,NP 04/30/2023,  3:17 PM

## 2023-05-01 DIAGNOSIS — K920 Hematemesis: Secondary | ICD-10-CM | POA: Diagnosis not present

## 2023-05-01 LAB — GLUCOSE, CAPILLARY
Glucose-Capillary: 82 mg/dL (ref 70–99)
Glucose-Capillary: 106 mg/dL — ABNORMAL HIGH (ref 70–99)
Glucose-Capillary: 80 mg/dL (ref 70–99)
Glucose-Capillary: 150 mg/dL — ABNORMAL HIGH (ref 70–99)

## 2023-05-01 LAB — HEMOGLOBIN AND HEMATOCRIT, BLOOD
HCT: 34.4 % — ABNORMAL LOW (ref 36.0–46.0)
Hemoglobin: 11.2 g/dL — ABNORMAL LOW (ref 12.0–15.0)

## 2023-05-01 MED ORDER — PANTOPRAZOLE SODIUM 40 MG PO TBEC: 40.0000 mg | DELAYED_RELEASE_TABLET | Freq: Two times a day (BID) | ORAL | 0 refills | Status: DC

## 2023-05-01 NOTE — Plan of Care (Signed)

## 2023-05-01 NOTE — Progress Notes (Signed)
 Alcario Drought to be D/C'd  per MD order.  Discussed with the patient and all questions fully answered.  VSS, Skin clean, dry and intact without evidence of skin break down, no evidence of skin tears noted.  IV catheter discontinued intact. Site without signs and symptoms of complications. Dressing and pressure applied.  An After Visit Summary was printed and given to the patient. Patient received prescription.  D/c education completed with patient/family including follow up instructions, medication list, d/c activities limitations if indicated, with other d/c instructions as indicated by MD - patient able to verbalize understanding, all questions fully answered.   Patient instructed to return to ED, call 911, or call MD for any changes in condition.   Patient to be escorted via WC, and D/C home via private auto.

## 2023-05-01 NOTE — Discharge Summary (Signed)
 Physician Discharge Summary  CONSWELLA BRUNEY HYQ:[id number] DOB: [birth year] DOA: 04/28/2023  PCP: Esperanza Richters, PA-C  Admit date: 04/28/2023 Discharge date: 05/01/2023  Admitted From: Home  Discharge disposition: Home   Recommendations for Outpatient Follow-Up:   Follow up with your primary care provider in one week.  Check CBC, BMP, magnesium in the next visit Please consider referral to GI if continues to have GI symptoms.  Discharge Diagnosis:   Principal Problem:   Hematemesis Active Problems:   DM2 (diabetes mellitus, type 2) (HCC)   HLD (hyperlipidemia)   Dyslipidemia   Chronic kidney disease, stage III (moderate) (HCC)   Gastroparesis   UTI (urinary tract infection)   Nausea & vomiting   Hypertension   Essential hypertension   History of pulmonary embolism   Acute GI bleeding   Intractable vomiting   Left lower quadrant abdominal pain   Discharge Condition: Improved.  Diet recommendation: Low sodium, heart healthy.  Carbohydrate-modified.  Small frequent meals advised.  Low-fat diet.  Wound care: None.  Code status: Full.   History of Present Illness:   [name] is a 58 y.o. female with history of diabetes mellitus type 2, hypertension, hyperlipidemia, chronic kidney disease stage III with diagnosis of PE in December 2024 presented to hospital with nausea, vomiting and abdominal discomfort since yesterday.  History of gastroparesis in September 2024 and was admitted for the same.  Patient stated that she had some yellowish vomitus initially followed by bloody vomitus.  Patient has been on Eliquis since December 2024 but had not taken it last 4 days due to appointment with dentist.  In the ED, CT angiogram of the abdomen pelvis did not show any active bleeding.  Hemoglobin was 14.5.  Creatinine 1.4.  Lipase was 41.  Patient was then admitted hospital with intractable nausea vomiting and hematemesis    Hospital Course:   Following conditions  were addressed during hospitalization as listed below,  Intractable nausea vomiting with hematemesis -   Gastroparesis.   Resolved at this time.  CTA scan of the abdomen was negative for acute bleeding.  Likely Mallory-Weiss tear.   Of note patient had the EGD done in September 2024 showed mild gastritis.  Seen by GI and since patient with stable hemoglobin and no further emesis no plans were made for further intervention and patient was advanced on diet which she has tolerated at this time.  Patient remained hemodynamically stable, hemoglobin has remained stable as well.  Hemoglobin prior to discharge was 11.2.   Possible UTI with gas in the urinary bladder and UA shows abnormal urinalysis with 21-50 white cells.  Empirically received Rocephin during hospitalization and has completed course   History of PE diagnosed in December 2024 was off Eliquis for last 4 days in preparation for dental procedure.  Will restart once patient has completed dental procedures, if no dental procedures planned start in 2 days.   Diabetes mellitus type 2 with hyperglycemia    Last hemoglobin A1c was 7.4 in November 2024.  Resume home regimen on discharge.   Hypertension takes Coreg and amlodipine at home.      Chronic kidney disease stage IIIa creatinine at her baseline.  Latest creatinine of 1.1.   Hyperlipidemia on statins.    Disposition.  At this time, patient is stable for disposition home with outpatient PCP follow-up  Medical Consultants:   GI  Procedures:    None Subjective:   Today, patient was seen and examined at bedside.  No  further episodes of vomiting since coming to the hospital.  Tolerated oral diet.  No hematemesis.  Has mild abdominal discomfort has had a bowel movement yesterday.  Discharge Exam:   Vitals:   05/01/23 0446 05/01/23 0825  BP: (!) 123/91 133/85  Pulse: 81 80  Resp: 16 18  Temp: 98.2 F (36.8 C) 98.3 F (36.8 C)  SpO2: 94% 98%   Vitals:   04/30/23 1602  04/30/23 2006 05/01/23 0446 05/01/23 0825  BP: 126/86 110/78 (!) 123/91 133/85  Pulse: 79 79 81 80  Resp: 17 16 16 18   Temp: 98.4 F (36.9 C) 98.9 F (37.2 C) 98.2 F (36.8 C) 98.3 F (36.8 C)  TempSrc: Oral Oral Oral Oral  SpO2: 97% 97% 94% 98%  Weight:      Height:       Body mass index is 23.73 kg/m.   General: Alert awake, not in obvious distress HENT: pupils equally reacting to light,  No scleral pallor or icterus noted. Oral mucosa is moist.  Chest:  Clear breath sounds.   CVS: S1 &S2 heard. No murmur.  Regular rate and rhythm. Abdomen: Soft, nonspecific tenderness on palpation., nondistended.  Bowel sounds are heard.   Extremities: No cyanosis, clubbing or edema.  Peripheral pulses are palpable. Psych: Alert, awake and oriented, normal mood CNS:  No cranial nerve deficits.  Power equal in all extremities.   Skin: Warm and dry.  No rashes noted.  The results of significant diagnostics from this hospitalization (including imaging, microbiology, ancillary and laboratory) are listed below for reference.     Diagnostic Studies:   CT ANGIO GI BLEED Result Date: 04/29/2023 CLINICAL DATA:  Left upper quadrant pain, vomiting blood, low appetite, chronic mesenteric ischemia EXAM: CTA ABDOMEN AND PELVIS WITHOUT AND WITH CONTRAST TECHNIQUE: Multidetector CT imaging of the abdomen and pelvis was performed using the standard protocol during bolus administration of intravenous contrast. Multiplanar reconstructed images and MIPs were obtained and reviewed to evaluate the vascular anatomy. RADIATION DOSE REDUCTION: This exam was performed according to the departmental dose-optimization program which includes automated exposure control, adjustment of the mA and/or kV according to patient size and/or use of iterative reconstruction technique. CONTRAST:  75mL OMNIPAQUE IOHEXOL 350 MG/ML SOLN COMPARISON:  02/02/2023 FINDINGS: VASCULAR Scattered calcified atherosclerotic plaque the aorta and its  mesenteric, renal, and iliac artery branches without hemodynamically significant stenosis. No aortic aneurysm or dissection. Review of the MIP images confirms the above findings. NON-VASCULAR Lower chest: No acute abnormality. Hepatobiliary: Cholecystectomy. No biliary dilation. Unremarkable liver. Pancreas: Unremarkable. Spleen: Unremarkable. Adrenals/Urinary Tract: Normal adrenal glands. No urinary calculi or hydronephrosis. Mild diffuse bladder wall thickening. Gas within the anti dependent bladder. Stomach/Bowel: No bowel obstruction or bowel wall thickening. No evidence of active GI bleeding. Stomach and appendix are within normal limits. Lymphatic: No lymphadenopathy.  Pelvic lymph node dissection. Reproductive: Postoperative change of partial hysterectomy. No adnexal mass. Other: No free intraperitoneal fluid or air. Musculoskeletal: No acute fracture. IMPRESSION: 1. No evidence of active GI bleeding. No evidence of mesenteric ischemia. 2. Mild diffuse bladder wall thickening with gas within the anti dependent bladder. Correlate with recent instrumentation and urinalysis to exclude cystitis. 3.  Aortic Atherosclerosis (ICD10-I70.0). Electronically Signed   By: Minerva Fester M.D.   On: 04/29/2023 00:05     Labs:   Basic Metabolic Panel: Recent Labs  Lab 04/28/23 2120 04/29/23 1312  NA 136 142  K 4.0 3.8  CL 98 104  CO2 20* 29  GLUCOSE 309* 118*  BUN 25* 19  CREATININE 1.41* 1.10*  CALCIUM 9.7 9.1   GFR Estimated Creatinine Clearance: 64.3 mL/min (A) (by C-G formula based on SCr of 1.1 mg/dL (H)). Liver Function Tests: Recent Labs  Lab 04/28/23 2120 04/29/23 1312  AST 28 16  ALT 19 13  ALKPHOS 97 86  BILITOT 0.8 0.6  PROT 8.5* 7.6  ALBUMIN 3.9 3.4*   Recent Labs  Lab 04/28/23 2120  LIPASE 41   No results for input(s): "AMMONIA" in the last 168 hours. Coagulation profile No results for input(s): "INR", "PROTIME" in the last 168 hours.  CBC: Recent Labs  Lab  04/28/23 2120 04/29/23 1311 04/30/23 0648 05/01/23 0637  WBC 8.3  --   --   --   NEUTROABS 6.8  --   --   --   HGB 14.5 12.4 12.6 11.2*  HCT 43.3 38.1 38.3 34.4*  MCV 84.2  --   --   --   PLT 339  --   --   --    Cardiac Enzymes: No results for input(s): "CKTOTAL", "CKMB", "CKMBINDEX", "TROPONINI" in the last 168 hours. BNP: Invalid input(s): "POCBNP" CBG: Recent Labs  Lab 04/30/23 1532 04/30/23 2033 05/01/23 0135 05/01/23 0445 05/01/23 0828  GLUCAP 124* 152* 82 106* 80   D-Dimer No results for input(s): "DDIMER" in the last 72 hours. Hgb A1c No results for input(s): "HGBA1C" in the last 72 hours. Lipid Profile No results for input(s): "CHOL", "HDL", "LDLCALC", "TRIG", "CHOLHDL", "LDLDIRECT" in the last 72 hours. Thyroid function studies No results for input(s): "TSH", "T4TOTAL", "T3FREE", "THYROIDAB" in the last 72 hours.  Invalid input(s): "FREET3" Anemia work up No results for input(s): "VITAMINB12", "FOLATE", "FERRITIN", "TIBC", "IRON", "RETICCTPCT" in the last 72 hours. Microbiology No results found for this or any previous visit (from the past 240 hours).   Discharge Instructions:   Discharge Instructions     Diet general   Complete by: As directed    Low fat   Discharge instructions   Complete by: As directed    Follow-up with your primary care provider in 1 week.  Check blood work at that time.  Please stick to small frequent meals, low residue diet.  Avoid fatt fried cheesy greasy food.  Seek medical attention for worsening symptoms.  Start blood thinners after dental procedure is done or if no plan for dental procedure start on 05/02/2023   Increase activity slowly   Complete by: As directed       Allergies as of 05/01/2023       Reactions   Lisinopril Swelling   Angioedema   Oxycodone-acetaminophen Nausea Only   Latex Itching, Swelling, Rash   Morphine Itching, Rash        Medication List     STOP taking these medications     omeprazole 40 MG capsule Commonly known as: PRILOSEC       TAKE these medications    albuterol 108 (90 Base) MCG/ACT inhaler Commonly known as: VENTOLIN HFA Inhale 2 puffs into the lungs every 6 (six) hours as needed for wheezing or shortness of breath.   amLODipine 5 MG tablet Commonly known as: NORVASC Take 1 tablet (5 mg total) by mouth daily.   atorvastatin 80 MG tablet Commonly known as: LIPITOR Take 1 tablet (80 mg total) by mouth daily.   baclofen 10 MG tablet Commonly known as: LIORESAL Take 1 tablet (10 mg total) by mouth 3 (three) times daily as needed for spasms   [employer]  100 UNIT/ML Inject 25 Units into the skin at bedtime.   busPIRone 15 MG tablet Commonly known as: BUSPAR Take 1 tablet (15 mg total) by mouth 2 (two) times daily.   carvedilol 25 MG tablet Commonly known as: COREG Take 1 tablet (25 mg total) by mouth 2 (two) times daily with a meal.   Eliquis 5 MG Tabs tablet Generic drug: apixaban Take 1 tablet (5 mg total) by mouth 2 (two) times daily.   fluticasone furoate-vilanterol 100-25 MCG/INH Aepb Commonly known as: Breo Ellipta Inhale 1 puff into the lungs daily. What changed:  when to take this reasons to take this   gabapentin 300 MG capsule Commonly known as: NEURONTIN TAKE 2 CAPSULES BY MOUTH THREE TIMES DAILY   metFORMIN 500 MG 24 hr tablet Commonly known as: GLUCOPHAGE-XR Take 1 tablet (500 mg total) by mouth daily with breakfast.   nitroGLYCERIN 0.4 MG SL tablet Commonly known as: NITROSTAT Place 1 tablet (0.4 mg total) under the tongue every 5 (five) minutes as needed for chest pain.   Oxycodone HCl 10 MG Tabs Take 1 tablet (10 mg total) by mouth 3 (three) times daily as needed for severe pain   Ozempic (0.25 or 0.5 MG/DOSE) 2 MG/3ML Sopn Generic drug: Semaglutide(0.25 or 0.5MG /DOS) Inject 0.5 mg into the skin once a week. mondays   pantoprazole 40 MG tablet Commonly known as: PROTONIX Take 1 tablet (40 mg  total) by mouth 2 (two) times daily.   PEN NEEDLES 31GX5/16" 31G X 8 MM Misc Use daily as directed   promethazine 12.5 MG tablet Commonly known as: PHENERGAN TAKE 1 TABLET BY MOUTH EVERY 8 HOURS AS NEEDED FOR NAUSEA FOR VOMITING   topiramate 50 MG tablet Commonly known as: TOPAMAX Take 1 tablet (50 mg total) by mouth 2 (two) times daily. What changed:  when to take this reasons to take this   VITAMIN D PO Take 5,000 Units by mouth daily.        Follow-up Information     Saguier, Ramon Dredge, PA-C Follow up in 1 week(s).   Specialties: Internal Medicine, Family Medicine Contact information: [address] [place] [address] [phone]                  Time coordinating discharge: 39 minutes  Signed:  Seibert Keeter  [facility] 05/01/2023, 11:21 AM

## 2023-05-01 NOTE — Plan of Care (Signed)

## 2023-05-01 NOTE — Plan of Care (Signed)
 Problem: Education: Goal: Ability to describe self-care measures that may prevent or decrease complications (Diabetes Survival Skills Education) will improve 05/01/2023 1433 by Montez Hageman, RN Outcome: Adequate for Discharge 05/01/2023 1403 by Montez Hageman, RN Outcome: Adequate for Discharge Goal: Individualized Educational Video(s) 05/01/2023 1433 by Montez Hageman, RN Outcome: Adequate for Discharge 05/01/2023 1403 by Montez Hageman, RN Outcome: Adequate for Discharge   Problem: Coping: Goal: Ability to adjust to condition or change in health will improve 05/01/2023 1433 by Montez Hageman, RN Outcome: Adequate for Discharge 05/01/2023 1403 by Montez Hageman, RN Outcome: Adequate for Discharge   Problem: Fluid Volume: Goal: Ability to maintain a balanced intake and output will improve 05/01/2023 1433 by Montez Hageman, RN Outcome: Adequate for Discharge 05/01/2023 1403 by Montez Hageman, RN Outcome: Adequate for Discharge   Problem: Health Behavior/Discharge Planning: Goal: Ability to identify and utilize available resources and services will improve 05/01/2023 1433 by Montez Hageman, RN Outcome: Adequate for Discharge 05/01/2023 1403 by Montez Hageman, RN Outcome: Adequate for Discharge Goal: Ability to manage health-related needs will improve 05/01/2023 1433 by Montez Hageman, RN Outcome: Adequate for Discharge 05/01/2023 1403 by Montez Hageman, RN Outcome: Adequate for Discharge   Problem: Metabolic: Goal: Ability to maintain appropriate glucose levels will improve 05/01/2023 1433 by Montez Hageman, RN Outcome: Adequate for Discharge 05/01/2023 1403 by Montez Hageman, RN Outcome: Adequate for Discharge   Problem: Nutritional: Goal: Maintenance of adequate nutrition will improve 05/01/2023 1433 by Montez Hageman, RN Outcome: Adequate for Discharge 05/01/2023 1403 by Montez Hageman, RN Outcome:  Adequate for Discharge Goal: Progress toward achieving an optimal weight will improve 05/01/2023 1433 by Montez Hageman, RN Outcome: Adequate for Discharge 05/01/2023 1403 by Montez Hageman, RN Outcome: Adequate for Discharge   Problem: Skin Integrity: Goal: Risk for impaired skin integrity will decrease 05/01/2023 1433 by Montez Hageman, RN Outcome: Adequate for Discharge 05/01/2023 1403 by Montez Hageman, RN Outcome: Adequate for Discharge   Problem: Tissue Perfusion: Goal: Adequacy of tissue perfusion will improve 05/01/2023 1433 by Montez Hageman, RN Outcome: Adequate for Discharge 05/01/2023 1403 by Montez Hageman, RN Outcome: Adequate for Discharge   Problem: Education: Goal: Knowledge of General Education information will improve Description: Including pain rating scale, medication(s)/side effects and non-pharmacologic comfort measures 05/01/2023 1433 by Montez Hageman, RN Outcome: Adequate for Discharge 05/01/2023 1403 by Montez Hageman, RN Outcome: Adequate for Discharge   Problem: Health Behavior/Discharge Planning: Goal: Ability to manage health-related needs will improve 05/01/2023 1433 by Montez Hageman, RN Outcome: Adequate for Discharge 05/01/2023 1403 by Montez Hageman, RN Outcome: Adequate for Discharge   Problem: Clinical Measurements: Goal: Ability to maintain clinical measurements within normal limits will improve 05/01/2023 1433 by Montez Hageman, RN Outcome: Adequate for Discharge 05/01/2023 1403 by Montez Hageman, RN Outcome: Adequate for Discharge Goal: Will remain free from infection 05/01/2023 1433 by Montez Hageman, RN Outcome: Adequate for Discharge 05/01/2023 1403 by Montez Hageman, RN Outcome: Adequate for Discharge Goal: Diagnostic test results will improve 05/01/2023 1433 by Montez Hageman, RN Outcome: Adequate for Discharge 05/01/2023 1403 by Montez Hageman, RN Outcome:  Adequate for Discharge Goal: Respiratory complications will improve 05/01/2023 1433 by Montez Hageman, RN Outcome: Adequate for Discharge 05/01/2023 1403 by Montez Hageman, RN Outcome: Adequate for Discharge Goal: Cardiovascular complication will be avoided 05/01/2023 1433 by Montez Hageman, RN Outcome:  Adequate for Discharge 05/01/2023 1403 by Montez Hageman, RN Outcome: Adequate for Discharge   Problem: Activity: Goal: Risk for activity intolerance will decrease 05/01/2023 1433 by Montez Hageman, RN Outcome: Adequate for Discharge 05/01/2023 1403 by Montez Hageman, RN Outcome: Adequate for Discharge   Problem: Nutrition: Goal: Adequate nutrition will be maintained 05/01/2023 1433 by Montez Hageman, RN Outcome: Adequate for Discharge 05/01/2023 1403 by Montez Hageman, RN Outcome: Adequate for Discharge   Problem: Coping: Goal: Level of anxiety will decrease 05/01/2023 1433 by Montez Hageman, RN Outcome: Adequate for Discharge 05/01/2023 1403 by Montez Hageman, RN Outcome: Adequate for Discharge   Problem: Elimination: Goal: Will not experience complications related to bowel motility 05/01/2023 1433 by Montez Hageman, RN Outcome: Adequate for Discharge 05/01/2023 1403 by Montez Hageman, RN Outcome: Adequate for Discharge Goal: Will not experience complications related to urinary retention 05/01/2023 1433 by Montez Hageman, RN Outcome: Adequate for Discharge 05/01/2023 1403 by Montez Hageman, RN Outcome: Adequate for Discharge   Problem: Pain Managment: Goal: General experience of comfort will improve and/or be controlled 05/01/2023 1433 by Montez Hageman, RN Outcome: Adequate for Discharge 05/01/2023 1403 by Montez Hageman, RN Outcome: Adequate for Discharge   Problem: Safety: Goal: Ability to remain free from injury will improve 05/01/2023 1433 by Montez Hageman, RN Outcome: Adequate for  Discharge 05/01/2023 1403 by Montez Hageman, RN Outcome: Adequate for Discharge   Problem: Skin Integrity: Goal: Risk for impaired skin integrity will decrease 05/01/2023 1433 by Montez Hageman, RN Outcome: Adequate for Discharge 05/01/2023 1403 by Montez Hageman, RN Outcome: Adequate for Discharge

## 2023-05-01 NOTE — Progress Notes (Signed)
 Patient tolerated breakfast and lunch. This Clinical research associate inform the patient that per order if she tolerated po then can go home. Patient claimed GI said if she still have stomach pain , she can stay. Info was given to attending and per MD , patient is ok for discharge. Patient was made aware and patient ended up with the plan for discharge. Patient claimed she is in pain and would like Dilaudid injection prior to discharge.MD made aware.

## 2023-05-04 ENCOUNTER — Telehealth: Payer: Self-pay

## 2023-05-04 ENCOUNTER — Other Ambulatory Visit: Payer: Self-pay

## 2023-05-04 NOTE — Transitions of Care (Post Inpatient/ED Visit) (Signed)
 05/04/2023  Name: [name] MRN: [id number] DOB: 03-10-1965  Today's TOC FU Call Status: Today's TOC FU Call Status:: Successful TOC FU Call Completed TOC FU Call Complete Date: 05/04/23 Patient's Name and Date of Birth confirmed.  Transition Care Management Follow-up Telephone Call Date of Discharge: 05/01/23 Discharge Facility: [facility]) Type of Discharge: Inpatient Admission Primary Inpatient Discharge Diagnosis:: GI bleed How have you been since you were released from the hospital?: Better Any questions or concerns?: No  Items Reviewed: Did you receive and understand the discharge instructions provided?: Yes Medications obtained,verified, and reconciled?: Yes (Medications Reviewed) Any new allergies since your discharge?: No Dietary orders reviewed?: Yes Do you have support at home?: Yes People in Home: child(ren), adult  Medications Reviewed Today: Medications Reviewed Today     Reviewed by Karena Addison, LPN (Licensed Practical Nurse) on 05/04/23 at 1131  Med List Status: <None>   Medication Order Taking? Sig Documenting Provider Last Dose Status Informant  albuterol (VENTOLIN HFA) 108 (90 Base) MCG/ACT inhaler [id number] No Inhale 2 puffs into the lungs every 6 (six) hours as needed for wheezing or shortness of breath. Esperanza Richters, PA-C 04/28/2023 Bedtime Active Self, Pharmacy Records  amLODipine (NORVASC) 5 MG tablet [id number] No Take 1 tablet (5 mg total) by mouth daily. Esperanza Richters, PA-C 04/28/2023 Morning Active Self, Pharmacy Records  apixaban (ELIQUIS) 5 MG TABS tablet [id number] No Take 1 tablet (5 mg total) by mouth 2 (two) times daily. Marisue Brooklyn 04/25/2023 Active Self, Pharmacy Records           Med Note (GARNER, TIFFANY L   Mon May 04, 2023 10:48 AM)    atorvastatin (LIPITOR) 80 MG tablet [id number] No Take 1 tablet (80 mg total) by mouth daily. Esperanza Richters, PA-C 04/28/2023 Morning Active Self, Pharmacy Records  baclofen  (LIORESAL) 10 MG tablet [id number] No Take 1 tablet (10 mg total) by mouth 3 (three) times daily as needed for spasms Marisue Brooklyn 04/28/2023 Noon Active Self, Pharmacy Records  busPIRone (BUSPAR) 15 MG tablet [id number] No Take 1 tablet (15 mg total) by mouth 2 (two) times daily. Esperanza Richters, PA-C 04/28/2023 Morning Active Self, Pharmacy Records  carvedilol (COREG) 25 MG tablet [id number] No Take 1 tablet (25 mg total) by mouth 2 (two) times daily with a meal. Saguier, Ramon Dredge, PA-C Past Week Active Self, Pharmacy Records           Med Note (GARNER, TIFFANY L   Mon May 04, 2023 10:48 AM)    fluticasone furoate-vilanterol (BREO ELLIPTA) 100-25 MCG/INH AEPB [id number] No Inhale 1 puff into the lungs daily.  Patient taking differently: Inhale 1 puff into the lungs daily as needed (Asthma).   Saguier, Kateri Mc Past Week Active Self, Pharmacy Records  gabapentin (NEURONTIN) 300 MG capsule [id number] No TAKE 2 CAPSULES BY MOUTH THREE TIMES DAILY Marisue Brooklyn 04/28/2023 Morning Active Self, Pharmacy Records  Insulin Glargine (BASAGLAR KWIKPEN) 100 UNIT/ML [id number] No Inject 25 Units into the skin at bedtime. Marisue Brooklyn 04/27/2023 Active Self, Pharmacy Records  Insulin Pen Needle (PEN NEEDLES 31GX5/16") 31G X 8 MM MISC [id number] No Use daily as directed Saguier, Ramon Dredge, PA-C Taking Active Self, Pharmacy Records  metFORMIN (GLUCOPHAGE-XR) 500 MG 24 hr tablet [id number] No Take 1 tablet (500 mg total) by mouth daily with breakfast. Esperanza Richters, PA-C 04/28/2023 Morning Active Self, Pharmacy Records  nitroGLYCERIN (NITROSTAT) 0.4 MG SL tablet [id number] No Place 1 tablet (0.4 mg total) under the tongue every 5 (five)  minutes as needed for chest pain. Saguier, Ramon Dredge, PA-C Not Taking Active Self, Pharmacy Records           Med Note (WHITE, Corinna Gab Apr 29, 2023  2:52 AM) Hasn't taken in over a year, but kept on hand  Oxycodone HCl 10 MG TABS [id number] No Take 1 tablet (10 mg  total) by mouth 3 (three) times daily as needed for severe pain Marisue Brooklyn 04/28/2023 Morning Active Self, Pharmacy Records  OZEMPIC, 0.25 OR 0.5 MG/DOSE, 2 MG/3ML SOPN [id number] No Inject 0.5 mg into the skin once a week. mondays [provider] 04/27/2023 Active Self, Pharmacy Records  pantoprazole (PROTONIX) 40 MG tablet [id number]  Take 1 tablet (40 mg total) by mouth 2 (two) times daily. Joycelyn Das, MD  Active   promethazine (PHENERGAN) 12.5 MG tablet [id number] No TAKE 1 TABLET BY MOUTH EVERY 8 HOURS AS NEEDED FOR NAUSEA FOR VOMITING Marisue Brooklyn 04/28/2023 Noon Active Self, Pharmacy Records  topiramate (TOPAMAX) 50 MG tablet [id number] No Take 1 tablet (50 mg total) by mouth 2 (two) times daily.  Patient taking differently: Take 50 mg by mouth 2 (two) times daily as needed (headache).   Saguier, Kateri Mc Past Month Active Self, Pharmacy Records           Med Note (GARNER, TIFFANY L   Mon May 04, 2023 10:48 AM)    VITAMIN D PO [id number] No Take 5,000 Units by mouth daily. [provider] 04/28/2023 Morning Active Self, Pharmacy Records           Med Note (WHITE, TONIA S   Wed Apr 29, 2023  2:54 AM)              Home Care and Equipment/Supplies: Were Home Health Services Ordered?: NA Any new equipment or medical supplies ordered?: NA  Functional Questionnaire: Do you need assistance with bathing/showering or dressing?: No Do you need assistance with meal preparation?: No Do you need assistance with eating?: No Do you have difficulty maintaining continence: No Do you need assistance with getting out of bed/getting out of a chair/moving?: No Do you have difficulty managing or taking your medications?: No  Follow up appointments reviewed: PCP Follow-up appointment confirmed?: Yes Date of PCP follow-up appointment?: 05/07/23 Follow-up Provider: [facility] Follow-up appointment confirmed?: No Reason Specialist Follow-Up Not  Confirmed: Patient has Specialist Provider Number and will Call for Appointment Do you need transportation to your follow-up appointment?: No Do you understand care options if your condition(s) worsen?: Yes-patient verbalized understanding    SIGNATURE Karena Addison, LPN [facility] Nurse Health Advisor Direct Dial [phone]

## 2023-05-07 ENCOUNTER — Inpatient Hospital Stay: Admitting: Medical

## 2023-05-12 ENCOUNTER — Ambulatory Visit: Payer: Commercial Managed Care - HMO | Attending: Cardiology | Admitting: Cardiology

## 2023-05-13 ENCOUNTER — Other Ambulatory Visit: Payer: Self-pay | Admitting: Medical

## 2023-05-13 DIAGNOSIS — K529 Noninfective gastroenteritis and colitis, unspecified: Secondary | ICD-10-CM

## 2023-05-13 NOTE — Telephone Encounter (Signed)
 Copied from CRM [id number]. Topic: Clinical - Medication Refill >> May 13, 2023  3:17 PM Elizebeth Brooking wrote: Most Recent Primary Care Visit:  Provider: Esperanza Richters  Department: [facility]  Visit Type: HOSPITAL FOLLOW UP  Date: 02/03/2023  Medication: Oxycodone HCl 10 MG TABS  Has the patient contacted their pharmacy? Yes (Agent: If no, request that the patient contact the pharmacy for the refill. If patient does not wish to contact the pharmacy document the reason why and proceed with request.) (Agent: If yes, when and what did the pharmacy advise?)  Is this the correct pharmacy for this prescription? Yes If no, delete pharmacy and type the correct one.  This is the patient's preferred pharmacy:  [facility] [address] ([address] [place] - [address] [place] ([place]) [place] [address] Phone: [phone] Fax: [phone]   Has the prescription been filled recently? No  Is the patient out of the medication? Yes  Has the patient been seen for an appointment in the last year OR does the patient have an upcoming appointment? Yes  Can we respond through MyChart? Yes  Agent: Please be advised that Rx refills may take up to 3 business days. We ask that you follow-up with your pharmacy.

## 2023-05-13 NOTE — Telephone Encounter (Signed)
 Requesting: oxycodone Contract:11/12/22 UDS:11/12/22 Last Visit:02/03/23 Next Visit:n/a Last Refill:02/17/23  Please Advise

## 2023-05-14 MED ORDER — OXYCODONE HCL 10 MG PO TABS: 10.0000 mg | ORAL_TABLET | Freq: Three times a day (TID) | ORAL | 0 refills | Status: DC | PRN

## 2023-05-14 NOTE — Telephone Encounter (Signed)
Rx refill sent to pt pharmacy 

## 2023-06-03 NOTE — Progress Notes (Unsigned)
 Chief Complaint:follow-up hopsital Primary GI Doctor:Dr. Rosaline Coma  HPI: 58 year old female with a past medical history noteworthy for gastroparesis admitted to the hospital (04/28/23) with nausea, vomiting and hematemesis.  Patient reports episodes of nausea and vomiting that occur approximately once every 3 to 6 months for the last 3 years.  During hospitalization in 10/2022 she was diagnosed with gastroparesis based upon an endoscopic finding of retained food in her stomach.  States that she has generally been doing well on a combination of Reglan , Carafate  and pantoprazole .  Symptoms yesterday started abruptly.  Has some abdominal discomfort.  No diarrhea.  Denies fevers.  Emesis initially only contained stomach fluid but progressed to bright red/burgundy blood after repeated episodes.  Mallory-Weiss tear suspected.   On exam she is alert but appears tired and fatigued Vital signs are stable Lungs are clear to auscultation bilaterally Heart S1-S2 regular rate and rhythm Abdomen is tender to palpation in the left lower quadrant without rebound or guarding and no hepatosplenomegaly   Labs show stable hemoglobin CT angio negative for bleeding-mild diffuse bladder wall thickening -possible cystitis  04/30/23 Suspect hematemesis at the time of admission was related to a Mallory-Weiss tear which has now resolved. Her nausea and vomiting are improving. The etiology of her pain is unclear given normal CT although there was evidence of cystitis. Pain could be related to history of UTI.      Interval History  Patient admits/denies GERD Patient admits/denies dysphagia Patient admits/denies nausea, vomiting, or weight loss  Patient admits/denies altered bowel habits Patient admits/denies abdominal pain Patient admits/denies rectal bleeding   Denies/Admits alcohol Denies/Admits smoking Denies/Admits NSAID use. Denies/Admits they are on blood thinners.  Patients last colonoscopy Patients last  EGD  Patient's family history includes  Wt Readings from Last 3 Encounters:  04/28/23 175 lb (79.4 kg)  02/03/23 175 lb (79.4 kg)  02/02/23 173 lb 11.2 oz (78.8 kg)      Past Medical History:  Diagnosis Date   Abdominal pain with vomiting    ABDOMINAL PAIN-EPIGASTRIC 09/14/2009   Qualifier: Diagnosis of   By: Francisca Irvine PA-c, [name]        Abnormal CT scan 11/05/2022   Abnormal Pap smear of cervix    Abscess of left lower leg    Acute cystitis 07/07/2017   Acute kidney injury superimposed on chronic kidney disease (HCC) 04/22/2021   Acute pulmonary embolism (HCC) 01/30/2023   Acute pyelonephritis 08/02/2022   Acute renal failure superimposed on stage 3a chronic kidney disease (HCC) 11/04/2022   Acute respiratory failure with hypoxia (HCC) 01/31/2023   ANKLE PAIN, LEFT 12/25/2009   Qualifier: Diagnosis of   By: Jayne Mews MD, [name]         Anxiety 01/22/2016   Asthma    Asthma without acute exacerbation 01/31/2023   CAD (coronary artery disease) 07/07/2017   Cardiac murmur 02/01/2019   Cardiomegaly 01/31/2023   Cardiomyopathy, secondary (HCC) 09/06/2009   Qualifier: Diagnosis of  By: Lavonia Powers   Formatting of this note might be different from the original. Overview:  Qualifier: Diagnosis of  By: Lavonia Powers   Cardiomyopathy, unspecified (HCC) 09/06/2009   Formatting of this note might be different from the original.  Overview:   Overview:   Qualifier: Diagnosis of   By: Lavonia Powers     Overview:   Overview:   Qualifier: Diagnosis of   By: Lavonia Powers  Formatting of this note might be different from the  original.  Overview:   Qualifier: Diagnosis of   By: Lavonia Powers     Cataract    Cellulitis of left leg 04/22/2021   Cerebrovascular disease 09/13/2012   Cervical cancer (HCC) 2001   Radical hysterectomy in [facility]   CHEST PAIN, ATYPICAL 07/30/2009   Qualifier: Diagnosis of   By:  Jayne Mews MD, Jenice Mitts of this note might be different from the original.  Overview:   Qualifier: Diagnosis of   By: Jayne Mews MD, [name]     Chest pain, unspecified 04/22/2021   Chronic back pain    Chronic diastolic HF (heart failure) (HCC) 05/21/2021   Chronic kidney disease, stage III (moderate) (HCC) 05/23/2014   Chronic pain syndrome 01/23/2016   Coffee ground emesis 11/05/2022   Coronary artery disease    30% lesions noted   Current use of insulin  (HCC) 05/23/2014   Dehydration 08/02/2022   Depression    Diabetes type 2, uncontrolled 05/23/2014   Diabetic gastroparesis associated with type 1 diabetes mellitus (HCC) 11/27/2016   DIABETIC PERIPHERAL NEUROPATHY 09/21/2009   Qualifier: Diagnosis of  By: Jayne Mews MD, [name]     Diabetic peripheral neuropathy (HCC) 01/23/2016   DM2 (diabetes mellitus, type 2) (HCC) 07/30/2009   Qualifier: Diagnosis of   By: Jayne Mews MD, [name]         Dyslipidemia 02/01/2019   Dysuria 12/25/2009   Qualifier: Diagnosis of   By: Jayne Mews MD, [name]         Essential hypertension 08/30/2009   Qualifier: Diagnosis of  By: Adelle Agent, CNA, Christy     Gastric atony 07/30/2009   Formatting of this note might be different from the original.  Overview:   Qualifier: Diagnosis of   By: Jayne Mews MD, [name]     Gastroesophageal reflux disease 07/30/2009   Formatting of this note might be different from the original. Overview:  Overview:  Qualifier: Diagnosis of  By: Jayne Mews MD, Wyatt Heap: Diagnosis of  By: Daphane Dynes NP, Florestine Hurl of this note might be different from the original. Overview:  Qualifier: Diagnosis of  By: Jayne Mews MD, Wyatt Heap: Diagnosis of  By: Daphane Dynes NP, Maureen Sour   Gastroparesis 07/30/2009   Qualifier: Diagnosis of   By: Jayne Mews MD, [name]         GERD 07/30/2009   Qualifier: Diagnosis of   By: Jayne Mews MD, Wyatt Heap: Diagnosis of   By: Daphane Dynes NP, Maureen Sour         GERD  (gastroesophageal reflux disease)    High anion gap metabolic acidosis 08/02/2022   History of cervical cancer 08/17/2015   Status post partial hysterectomy  Formatting of this note might be different from the original. Status post partial hysterectomy   HLD (hyperlipidemia) 07/30/2009   Qualifier: Diagnosis of   By: Jayne Mews MD, [name]         Hyperlipidemia    Hyperlipidemia, unspecified 07/30/2009   Formatting of this note might be different from the original.  Overview:   Qualifier: Diagnosis of   By: Jayne Mews MD, Jenice Mitts of this note might be different from the original.  Overview:   Qualifier: Diagnosis of   By: Jayne Mews MD, [name]     Hypertension    Hypertensive urgency 08/30/2009   Qualifier: Diagnosis of   By: Adelle Agent, CNA, Christy         Hypomagnesemia 01/31/2023   INSOMNIA 09/21/2009   Qualifier:  Diagnosis of  By: Jayne Mews MD, [name]     Intractable nausea and vomiting 08/03/2022   Lumbar facet arthropathy 05/14/2016   Lumbar spinal stenosis 02/11/2018   Metatarsalgia of both feet 03/11/2017   Migraine 09/13/2012   Overview:  IMPRESSION: possible abd migraine   Mood disorder (HCC) 01/05/2022   Nausea & vomiting 07/07/2017   Nausea with vomiting, unspecified 09/14/2009   Qualifier: Diagnosis of   By: Shelva Dice, [name]        Formatting of this note might be different from the original.  Overview:   Qualifier: Diagnosis of   By: Shelva Dice, [name]     Neuropathy 07/20/2017   Overview:   Diabetic     Formatting of this note might be different from the original.  Diabetic     Nonspecific abnormal findings on radiological and examination of skull and head 09/13/2012   Normocytic anemia 01/31/2023   Osteomyelitis of second toe of left foot (HCC) 01/05/2022   Overweight (BMI 25.0-29.9) 07/21/2014   Pain of left calf 04/19/2021   PUD (peptic ulcer disease) 09/28/2009   Qualifier: Diagnosis of   By: Daphane Dynes NP, Paula         Pure hypercholesterolemia  11/27/2016   Retention of urine 06/25/2011   Sepsis due to cellulitis (HCC) 04/22/2021   Severe sepsis (HCC) 04/22/2021   SIRS (systemic inflammatory response syndrome) (HCC) 05/21/2021   Thrombocytosis 01/31/2023   TOBACCO ABUSE 09/21/2009   Qualifier: Diagnosis of  By: Jayne Mews MD, Jenice Mitts of this note might be different from the original. Overview:  Qualifier: Diagnosis of  By: Jayne Mews MD, Kieth Pelt, SERUM, ELEVATED 09/21/2009   Qualifier: Diagnosis of   By: Jayne Mews MD, [name]         Trigeminal neuralgia    Tuberculin test reaction 07/30/2009   Formatting of this note might be different from the original.  Overview:   Annotation: CXR clear ?  diagnosed in 2/11?  On INH/pyridoxine  Qualifier: Diagnosis of   By: Jayne Mews MD, [name]     Type 2 diabetes mellitus with hyperglycemia (HCC) 07/30/2009   Qualifier: Diagnosis of   By: Jayne Mews MD, Jenice Mitts of this note might be different from the original.  Overview:   Qualifier: Diagnosis of   By: Jayne Mews MD, [name]     Type 2 diabetes mellitus with stage 3 chronic kidney disease (HCC) 09/14/2009   Qualifier: Diagnosis of   By: Shelva Dice, [name]        Uncontrolled type 2 diabetes mellitus with hyperglycemia, without long-term current use of insulin  (HCC) 04/22/2021   UTI (urinary tract infection) 09/21/2009   Qualifier: Diagnosis of   By: Jayne Mews MD, Dicky Fountain, CANDIDAL 12/25/2009   Qualifier: Diagnosis of  By: Jayne Mews MD, [name]     Viral upper respiratory tract infection 11/30/2020   Vitamin D deficiency 04/25/2013    Past Surgical History:  Procedure Laterality Date   ABDOMINAL HYSTERECTOMY     radical hysterectomy   AMPUTATION Left 01/06/2022   Procedure: AMPUTATION 2nd TOE;  Surgeon: Timothy Ford, MD;  Location: [facility] OR;  Service: Orthopedics;  Laterality: Left;   BIOPSY  11/06/2022   Procedure: BIOPSY;  Surgeon: Daina Drum, MD;  Location: [facility]  ENDOSCOPY;  Service: Gastroenterology;;   CHOLECYSTECTOMY     ESOPHAGOGASTRODUODENOSCOPY (EGD) WITH PROPOFOL  N/A 11/06/2022   Procedure: ESOPHAGOGASTRODUODENOSCOPY (EGD) WITH PROPOFOL ;  Surgeon: Daina Drum, MD;  Location: [facility] ENDOSCOPY;  Service: Gastroenterology;  Laterality: N/A;   I & D EXTREMITY Left 04/24/2021   Procedure: LEFT LEG DEBRIDEMENT;  Surgeon: Timothy Ford, MD;  Location: [facility] OR;  Service: Orthopedics;  Laterality: Left;   I & D EXTREMITY Left 05/01/2021   Procedure: LEFT KNEE DEBRIDEMENT;  Surgeon: Timothy Ford, MD;  Location: [facility] OR;  Service: Orthopedics;  Laterality: Left;    Current Outpatient Medications  Medication Sig Dispense Refill   albuterol  (VENTOLIN  HFA) 108 (90 Base) MCG/ACT inhaler Inhale 2 puffs into the lungs every 6 (six) hours as needed for wheezing or shortness of breath. 18 g 2   amLODipine  (NORVASC ) 5 MG tablet Take 1 tablet (5 mg total) by mouth daily. 90 tablet 3   apixaban  (ELIQUIS ) 5 MG TABS tablet Take 1 tablet (5 mg total) by mouth 2 (two) times daily. 60 tablet 4   atorvastatin  (LIPITOR ) 80 MG tablet Take 1 tablet (80 mg total) by mouth daily. 90 tablet 3   baclofen  (LIORESAL ) 10 MG tablet Take 1 tablet (10 mg total) by mouth 3 (three) times daily as needed for spasms 30 each 3   busPIRone  (BUSPAR ) 15 MG tablet Take 1 tablet (15 mg total) by mouth 2 (two) times daily. 60 tablet 4   carvedilol  (COREG ) 25 MG tablet Take 1 tablet (25 mg total) by mouth 2 (two) times daily with a meal. 180 tablet 0   fluticasone  furoate-vilanterol (BREO ELLIPTA ) 100-25 MCG/INH AEPB Inhale 1 puff into the lungs daily. (Patient taking differently: Inhale 1 puff into the lungs daily as needed (Asthma).) 100 each 2   gabapentin  (NEURONTIN ) 300 MG capsule TAKE 2 CAPSULES BY MOUTH THREE TIMES DAILY 270 capsule 0   Insulin  Glargine (BASAGLAR  KWIKPEN) 100 UNIT/ML Inject 25 Units into the skin at bedtime. 15 mL 2   Insulin  Pen Needle (PEN NEEDLES 31GX5/16") 31G X 8 MM MISC  Use daily as directed 100 each 0   metFORMIN  (GLUCOPHAGE -XR) 500 MG 24 hr tablet Take 1 tablet (500 mg total) by mouth daily with breakfast. 60 tablet 2   nitroGLYCERIN  (NITROSTAT ) 0.4 MG SL tablet Place 1 tablet (0.4 mg total) under the tongue every 5 (five) minutes as needed for chest pain. 30 tablet 3   Oxycodone  HCl 10 MG TABS Take 1 tablet (10 mg total) by mouth 3 (three) times daily as needed for severe pain 90 tablet 0   OZEMPIC , 0.25 OR 0.5 MG/DOSE, 2 MG/3ML SOPN Inject 0.5 mg into the skin once a week. mondays     pantoprazole  (PROTONIX ) 40 MG tablet Take 1 tablet (40 mg total) by mouth 2 (two) times daily. 60 tablet 0   promethazine  (PHENERGAN ) 12.5 MG tablet TAKE 1 TABLET BY MOUTH EVERY 8 HOURS AS NEEDED FOR NAUSEA FOR VOMITING 20 tablet 0   topiramate  (TOPAMAX ) 50 MG tablet Take 1 tablet (50 mg total) by mouth 2 (two) times daily. (Patient taking differently: Take 50 mg by mouth 2 (two) times daily as needed (headache).) 60 tablet 12   VITAMIN D PO Take 5,000 Units by mouth daily.     No current facility-administered medications for this visit.    Allergies as of 06/04/2023 - Review Complete 05/04/2023  Allergen Reaction Noted   Lisinopril  Swelling 04/24/2022   Oxycodone -acetaminophen  Nausea Only 01/05/2022   Latex Itching, Swelling, and Rash 07/30/2009   Morphine Itching and Rash 07/30/2009    Family History  Problem Relation Age of Onset  Breast cancer Mother    Stroke Mother    Hypertension Mother    Diabetes Mother    Hypertension Father    Diabetes Father    Heart attack Father    Hypertension Sister    Diabetes Sister    Hypertension Brother    Diabetes Brother    Hypertension Brother    Diabetes Brother    Stroke Maternal Grandmother    Colon cancer Neg Hx    Colon polyps Neg Hx    Esophageal cancer Neg Hx    Stomach cancer Neg Hx    Rectal cancer Neg Hx     Review of Systems:    Constitutional: No weight loss, fever, chills, weakness or  fatigue HEENT: Eyes: No change in vision               Ears, Nose, Throat:  No change in hearing or congestion Skin: No rash or itching Cardiovascular: No chest pain, chest pressure or palpitations   Respiratory: No SOB or cough Gastrointestinal: See HPI and otherwise negative Genitourinary: No dysuria or change in urinary frequency Neurological: No headache, dizziness or syncope Musculoskeletal: No new muscle or joint pain Hematologic: No bleeding or bruising Psychiatric: No history of depression or anxiety    Physical Exam:  Vital signs: There were no vitals taken for this visit.  Constitutional:   Pleasant *** female appears to be in NAD, Well developed, Well nourished, alert and cooperative Head:  Normocephalic and atraumatic. Eyes:   PEERL, EOMI. No icterus. Conjunctiva pink. Ears:  Normal auditory acuity. Neck:  Supple Throat: Oral cavity and pharynx without inflammation, swelling or lesion.  Respiratory: Respirations even and unlabored. Lungs clear to auscultation bilaterally.   No wheezes, crackles, or rhonchi.  Cardiovascular: Normal S1, S2. Regular rate and rhythm. No peripheral edema, cyanosis or pallor.  Gastrointestinal:  Soft, nondistended, nontender. No rebound or guarding. Normal bowel sounds. No appreciable masses or hepatomegaly. Rectal:  Not performed.  Anoscopy: Msk:  Symmetrical without gross deformities. Without edema, no deformity or joint abnormality.  Neurologic:  Alert and  oriented x4;  grossly normal neurologically.  Skin:   Dry and intact without significant lesions or rashes. Psychiatric: Oriented to person, place and time. Demonstrates good judgement and reason without abnormal affect or behaviors.  RELEVANT LABS AND IMAGING: CBC    Latest Ref Rng & Units 05/01/2023    6:37 AM 04/30/2023    6:48 AM 04/29/2023    1:11 PM  CBC  Hemoglobin 12.0 - 15.0 g/dL 40.9  81.1  91.4   Hematocrit 36.0 - 46.0 % 34.4  38.3  38.1      CMP     Latest Ref Rng  & Units 04/29/2023    1:12 PM 04/28/2023    9:20 PM 02/09/2023   10:53 AM  CMP  Glucose 70 - 99 mg/dL 782  956  213   BUN 6 - 20 mg/dL 19  25  18    Creatinine 0.44 - 1.00 mg/dL 0.86  5.78  4.69   Sodium 135 - 145 mmol/L 142  136  135   Potassium 3.5 - 5.1 mmol/L 3.8  4.0  3.8   Chloride 98 - 111 mmol/L 104  98  101   CO2 22 - 32 mmol/L 29  20  24    Calcium  8.9 - 10.3 mg/dL 9.1  9.7  9.2   Total Protein 6.5 - 8.1 g/dL 7.6  8.5  7.7   Total Bilirubin 0.0 - 1.2 mg/dL  0.6  0.8  0.3   Alkaline Phos 38 - 126 U/L 86  97  102   AST 15 - 41 U/L 16  28  16    ALT 0 - 44 U/L 13  19  13       Lab Results  Component Value Date   TSH 1.416 11/06/2022   CT ANGIO GI BLEED CLINICAL DATA:  Left upper quadrant pain, vomiting blood, low appetite, chronic mesenteric ischemia   EXAM: CTA ABDOMEN AND PELVIS WITHOUT AND WITH CONTRAST   TECHNIQUE: Multidetector CT imaging of the abdomen and pelvis was performed using the standard protocol during bolus administration of intravenous contrast. Multiplanar reconstructed images and MIPs were obtained and reviewed to evaluate the vascular anatomy.   RADIATION DOSE REDUCTION: This exam was performed according to the departmental dose-optimization program which includes automated exposure control, adjustment of the mA and/or kV according to patient size and/or use of iterative reconstruction technique.   CONTRAST:  75mL OMNIPAQUE  IOHEXOL  350 MG/ML SOLN   COMPARISON:  02/02/2023   FINDINGS: VASCULAR   Scattered calcified atherosclerotic plaque the aorta and its mesenteric, renal, and iliac artery branches without hemodynamically significant stenosis. No aortic aneurysm or dissection.   Review of the MIP images confirms the above findings.   NON-VASCULAR   Lower chest: No acute abnormality.   Hepatobiliary: Cholecystectomy. No biliary dilation. Unremarkable liver.   Pancreas: Unremarkable.   Spleen: Unremarkable.   Adrenals/Urinary Tract:  Normal adrenal glands. No urinary calculi or hydronephrosis. Mild diffuse bladder wall thickening. Gas within the anti dependent bladder.   Stomach/Bowel: No bowel obstruction or bowel wall thickening. No evidence of active GI bleeding. Stomach and appendix are within normal limits.   Lymphatic: No lymphadenopathy.  Pelvic lymph node dissection.   Reproductive: Postoperative change of partial hysterectomy. No adnexal mass.   Other: No free intraperitoneal fluid or air.   Musculoskeletal: No acute fracture.   IMPRESSION: 1. No evidence of active GI bleeding. No evidence of mesenteric ischemia. 2. Mild diffuse bladder wall thickening with gas within the anti dependent bladder. Correlate with recent instrumentation and urinalysis to exclude cystitis. 3.  Aortic Atherosclerosis (ICD10-I70.0).   Electronically Signed   By: Rozell Cornet M.D.   On: 04/29/2023 00:05     Echo Dec 2024 Left Ventricle: Left ventricular ejection fraction, by estimation, is  50%. Left ventricular ejection fraction by 3D volume is 47 %.    Previous GI Evaluations    EGD Sept 2024 - Esophageal mucosal variant. Biopsied. - A medium amount of food (residue) in the stomach. - Gastritis. Biopsied. - Normal examined duodenum. Biopsied.   A. DUODENUM, BIOPSY:  Benign duodenal mucosa with no diagnostic abnormality   B. STOMACH, BIOPSY:  Reactive gastropathy with mild chronic gastritis  Negative for H. pylori, intestinal metaplasia, dysplasia and carcinoma  (see comment)   C. ESOPHAGUS, BIOPSY:  Reactive squamous mucosa with changes suggestive of reflux esophagitis  Negative for glandular epithelium, eosinophils, dysplasia and carcinoma   Assessment: 1. ***  Plan: 1. ***   Thank you for the courtesy of this consult. Please call me with any questions or concerns.   Trek Kimball, FNP-C [facility] 06/03/2023, 4:49 PM  Cc: Sueellen Emery, MD

## 2023-06-04 ENCOUNTER — Ambulatory Visit (INDEPENDENT_AMBULATORY_CARE_PROVIDER_SITE_OTHER): Admitting: Gastroenterology

## 2023-06-04 ENCOUNTER — Encounter: Payer: Self-pay | Admitting: Gastroenterology

## 2023-06-04 VITALS — BP 118/68 | HR 89 | Ht 72.0 in | Wt 168.0 lb

## 2023-06-04 DIAGNOSIS — K219 Gastro-esophageal reflux disease without esophagitis: Secondary | ICD-10-CM | POA: Diagnosis not present

## 2023-06-04 DIAGNOSIS — R112 Nausea with vomiting, unspecified: Secondary | ICD-10-CM | POA: Diagnosis not present

## 2023-06-04 DIAGNOSIS — K3184 Gastroparesis: Secondary | ICD-10-CM | POA: Diagnosis not present

## 2023-06-04 MED ORDER — PROMETHAZINE HCL 12.5 MG PO TABS: 12.5000 mg | ORAL_TABLET | Freq: Every day | ORAL | 2 refills | Status: DC

## 2023-06-04 NOTE — Patient Instructions (Addendum)
 Your provider has requested that you go to the basement level for lab work before leaving today. Press "B" on the elevator. The lab is located at the first door on the left as you exit the elevator.   Scheduled promethazine  12.5 mg scheduled at bedtime. Do not drive or operate machinery while on medication. Follow gastroparesis diet as listed below Recommend discussing with PCP about referral for chronic pain     Gastroparesis Please do small frequent meals like 4-6 meals a day.  Eat and drink liquids at separate times.  Avoid high fiber foods, cook your vegetables, avoid high fat food.  Suggest spreading protein throughout the day (greek yogurt, glucerna, soft meat, milk, eggs) Choose soft foods that you can mash with a fork When you are more symptomatic, change to pureed foods foods and liquids.  Consider reading "Living well with Gastroparesis" by Creasie Doctor Gastroparesis is a condition in which food takes longer than normal to empty from the stomach. This condition is also known as delayed gastric emptying. It is usually a long-term (chronic) condition. There is no cure, but there are treatments and things that you can do at home to help relieve symptoms. Treating the underlying condition that causes gastroparesis can also help relieve symptoms What are the causes? In many cases, the cause of this condition is not known. Possible causes include: A hormone (endocrine) disorder, such as hypothyroidism or diabetes. A nervous system disease, such as Parkinson's disease or multiple sclerosis. Cancer, infection, or surgery that affects the stomach or vagus nerve. The vagus nerve runs from your chest, through your neck, and to the lower part of your brain. A connective tissue disorder, such as scleroderma. Certain medicines. What increases the risk? You are more likely to develop this condition if: You have certain disorders or diseases. These may include: An endocrine  disorder. An eating disorder. Amyloidosis. Scleroderma. Parkinson's disease. Multiple sclerosis. Cancer or infection of the stomach or the vagus nerve. You have had surgery on your stomach or vagus nerve. You take certain medicines. You are female. What are the signs or symptoms? Symptoms of this condition include: Feeling full after eating very little or a loss of appetite. Nausea, vomiting, or heartburn. Bloating of your abdomen. Inconsistent blood sugar (glucose) levels on blood tests. Unexplained weight loss. Acid from the stomach coming up into the esophagus (gastroesophageal reflux). Sudden tightening (spasm) of the stomach, which can be painful. Symptoms may come and go. Some people may not notice any symptoms. How is this diagnosed? This condition is diagnosed with tests, such as: Tests that check how long it takes food to move through the stomach and intestines. These tests include: Upper gastrointestinal (GI) series. For this test, you drink a liquid that shows up well on X-rays, and then X-rays are taken of your intestines. Gastric emptying scintigraphy. For this test, you eat food that contains a small amount of radioactive material, and then scans are taken. Wireless capsule GI monitoring system. For this test, you swallow a pill (capsule) that records information about how foods and fluid move through your stomach. Gastric manometry. For this test, a tube is passed down your throat and into your stomach to measure electrical and muscular activity. Endoscopy. For this test, a long, thin tube with a camera and light on the end is passed down your throat and into your stomach to check for problems in your stomach lining. Ultrasound. This test uses sound waves to create images of the inside of your body.  This can help rule out gallbladder disease or pancreatitis as a cause of your symptoms. How is this treated? There is no cure for this condition, but treatment and home care  may relieve symptoms. Treatment may include: Treating the underlying cause. Managing your symptoms by making changes to your diet and exercise habits. Taking medicines to control nausea and vomiting and to stimulate stomach muscles. Getting food through a feeding tube in the hospital. This may be done in severe cases. Having surgery to insert a device called a gastric electrical stimulator into your body. This device helps improve stomach emptying and control nausea and vomiting. Follow these instructions at home: Take over-the-counter and prescription medicines only as told by your health care provider. Follow instructions from your health care provider about eating or drinking restrictions. Your health care provider may recommend that you: Eat smaller meals more often. Eat low-fat foods. Eat low-fiber forms of high-fiber foods. For example, eat cooked vegetables instead of raw vegetables. Have only liquid foods instead of solid foods. Liquid foods are easier to digest. Drink enough fluid to keep your urine pale yellow. Exercise as often as told by your health care provider. Keep all follow-up visits. This is important. Contact a health care provider if you: Notice that your symptoms do not improve with treatment. Have new symptoms. Get help right away if you: Have severe pain in your abdomen that does not improve with treatment. Have nausea that is severe or does not go away. Vomit every time you drink fluids. Summary Gastroparesis is a long-term (chronic) condition in which food takes longer than normal to empty from the stomach. Symptoms include nausea, vomiting, heartburn, bloating of your abdomen, and loss of appetite. Eating smaller portions, low-fat foods, and low-fiber forms of high-fiber foods may help you manage your symptoms. Get help right away if you have severe pain in your abdomen. This information is not intended to replace advice given to you by your health care  provider. Make sure you discuss any questions you have with your health care provider. Document Revised: 06/06/2019 Document Reviewed: 06/06/2019 Elsevier Patient Education  2021 [employer].

## 2023-06-05 NOTE — Progress Notes (Signed)
 I agree with the assessment and plan as outlined by Ms. May. Would also increase her pantoprazole  to 40 mg BID for the next 8 weeks, then can decrease back down to 40 mg every day.

## 2023-06-12 ENCOUNTER — Other Ambulatory Visit: Payer: Self-pay | Admitting: Medical

## 2023-06-12 NOTE — Telephone Encounter (Unsigned)
 Copied from CRM [id number]. Topic: Clinical - Medication Refill >> Jun 12, 2023 10:45 AM Lovett Ruck C wrote: Most Recent Primary Care Visit:  Provider: Sylvia Everts  Department: [facility]  Visit Type: HOSPITAL FOLLOW UP  Date: 02/03/2023  Medication: gabapentin  (NEURONTIN ) 300 MG capsule  Has the patient contacted their pharmacy? Yes (Agent: If no, request that the patient contact the pharmacy for the refill. If patient does not wish to contact the pharmacy document the reason why and proceed with request.) (Agent: If yes, when and what did the pharmacy advise?)  Is this the correct pharmacy for this prescription? Yes If no, delete pharmacy and type the correct one.  This is the patient's preferred pharmacy:  [id number] - [place] - [address] [place] [address] Phone: [phone] Fax: [phone]   Has the prescription been filled recently? No  Is the patient out of the medication? No-  Has the patient been seen for an appointment in the last year OR does the patient have an upcoming appointment? Yes  Can we respond through MyChart? Yes  Agent: Please be advised that Rx refills may take up to 3 business days. We ask that you follow-up with your pharmacy.

## 2023-06-15 MED ORDER — GABAPENTIN 300 MG PO CAPS: 600.0000 mg | ORAL_CAPSULE | Freq: Three times a day (TID) | ORAL | 0 refills | Status: DC

## 2023-06-23 ENCOUNTER — Telehealth: Payer: Self-pay | Admitting: Medical

## 2023-06-23 NOTE — Telephone Encounter (Signed)
 Copied from CRM [id number]. Topic: General - Other >> Jun 23, 2023 12:14 PM [name] F wrote: Reason for CRM: Patient would like to pick up the paperwork today that she needs for her disability tag at the [name]. Please call her at [phone].

## 2023-06-24 ENCOUNTER — Encounter: Admitting: Obstetrics and Gynecology

## 2023-07-17 ENCOUNTER — Other Ambulatory Visit: Payer: Self-pay | Admitting: Medical

## 2023-07-17 ENCOUNTER — Telehealth: Payer: Self-pay | Admitting: Medical

## 2023-07-17 NOTE — Telephone Encounter (Signed)
 Forms faxed, pt made aware. I do not know how to charge for forms.

## 2023-07-17 NOTE — Telephone Encounter (Signed)
 I filled out patient's dental clearance form that I just saw today as it was not presented to me until this morning.  Patient has various medical problems and therefore wanted patient to be seen today and we did offer her appointment but she states that she could not make it to our office for appointment.  The procedure is Monday.  She has various medical problems and I did want her to get a repeat CBC stat as she is anemic and on review that had not been rechecked.  I have placed that order but she could not come into the lab either.  Patient's blood pressure and pulse were stable.  I filled out the form informing dentist/oral surgeon of patient's medical history and if any clinical concerns on the day of the procedure to please let me know.  Appears reasonable to clear patient for procedure in light of the circumstances.  Please fax over form and put appropriate charge.

## 2023-07-17 NOTE — Telephone Encounter (Signed)
 Form was on [employer]? No one gave me the form. Did not see form until now. With her medical history want to check pt blood pressure and make sure is stable. The sheet from dentist office does not state what procedure will be done? If had seen on Monday would have adviised office visit. Will see pt today since pt procedure scheduled for monday

## 2023-07-17 NOTE — Telephone Encounter (Signed)
 Patient called back and asked if someone could call her and confirm when its faxed. She expressed that it's urgent for it to be faxed back by today or she will not be able to have her surgery done on Monday. I did reassure her that the team is actively working on the form to have it completed.

## 2023-07-17 NOTE — Telephone Encounter (Signed)
 Can pt come by for stat cbc. On review pt is anemic. I have not seen her in some time.  Can just go to lab this afternoon. If not then at least need to have blood pressure and pulse check. Can she give me readings. Pt having 2 teeth extracted and fitting for dentures. She also has gastroparesis any nausea or vomiting.

## 2023-07-17 NOTE — Telephone Encounter (Signed)
 Pt stated she dropped off authorization forms to be faxed to [employer] on [address] and needs them by Monday. The disability placard form is up front for PU

## 2023-07-17 NOTE — Telephone Encounter (Signed)
 Pt called back with readings .   BP 126/81  Pulse 87

## 2023-07-17 NOTE — Addendum Note (Signed)
 Addended by: Serafina Damme on: 07/17/2023 10:52 AM   Modules accepted: Orders

## 2023-07-19 ENCOUNTER — Encounter: Payer: Self-pay | Admitting: Medical

## 2023-07-20 NOTE — Telephone Encounter (Signed)
 Pt has not seen endo

## 2023-07-29 ENCOUNTER — Other Ambulatory Visit: Payer: Self-pay | Admitting: Medical

## 2023-08-10 ENCOUNTER — Telehealth: Payer: Self-pay | Admitting: Medical

## 2023-08-10 NOTE — Telephone Encounter (Signed)
 Copied from CRM [id number]. Topic: General - Other >> Aug 10, 2023  3:07 PM Taleah C wrote: Reason for CRM: pt called and requested for a medical note from [place] stating that she can't work night shift because of the medicine she is taking tha makes her drowsy. Please advise at [phone] [facility]) .

## 2023-08-12 NOTE — Telephone Encounter (Signed)
 Patient scheduled appointment on 08/19/23 @ 10:20 AM for 40 minutes.

## 2023-08-19 ENCOUNTER — Ambulatory Visit: Admitting: Medical

## 2023-08-20 ENCOUNTER — Ambulatory Visit: Admitting: Medical

## 2023-08-24 ENCOUNTER — Other Ambulatory Visit: Payer: Self-pay | Admitting: Medical

## 2023-08-25 ENCOUNTER — Ambulatory Visit: Admitting: Medical

## 2023-08-25 ENCOUNTER — Telehealth: Payer: Self-pay

## 2023-08-25 VITALS — BP 118/80 | HR 99 | Temp 98.7°F | Resp 16 | Ht 73.0 in | Wt 173.0 lb

## 2023-08-25 DIAGNOSIS — K529 Noninfective gastroenteritis and colitis, unspecified: Secondary | ICD-10-CM

## 2023-08-25 DIAGNOSIS — I10 Essential (primary) hypertension: Secondary | ICD-10-CM

## 2023-08-25 DIAGNOSIS — I2699 Other pulmonary embolism without acute cor pulmonale: Secondary | ICD-10-CM | POA: Diagnosis not present

## 2023-08-25 DIAGNOSIS — F419 Anxiety disorder, unspecified: Secondary | ICD-10-CM

## 2023-08-25 DIAGNOSIS — S98132A Complete traumatic amputation of one left lesser toe, initial encounter: Secondary | ICD-10-CM

## 2023-08-25 DIAGNOSIS — E1165 Type 2 diabetes mellitus with hyperglycemia: Secondary | ICD-10-CM | POA: Diagnosis not present

## 2023-08-25 DIAGNOSIS — F3289 Other specified depressive episodes: Secondary | ICD-10-CM

## 2023-08-25 DIAGNOSIS — Z79899 Other long term (current) drug therapy: Secondary | ICD-10-CM

## 2023-08-25 DIAGNOSIS — Z86718 Personal history of other venous thrombosis and embolism: Secondary | ICD-10-CM

## 2023-08-25 DIAGNOSIS — M5441 Lumbago with sciatica, right side: Secondary | ICD-10-CM

## 2023-08-25 DIAGNOSIS — F32A Depression, unspecified: Secondary | ICD-10-CM

## 2023-08-25 DIAGNOSIS — Z7984 Long term (current) use of oral hypoglycemic drugs: Secondary | ICD-10-CM

## 2023-08-25 DIAGNOSIS — G8929 Other chronic pain: Secondary | ICD-10-CM

## 2023-08-25 DIAGNOSIS — E785 Hyperlipidemia, unspecified: Secondary | ICD-10-CM

## 2023-08-25 DIAGNOSIS — Z7985 Long-term (current) use of injectable non-insulin antidiabetic drugs: Secondary | ICD-10-CM

## 2023-08-25 DIAGNOSIS — Z794 Long term (current) use of insulin: Secondary | ICD-10-CM

## 2023-08-25 DIAGNOSIS — Z86711 Personal history of pulmonary embolism: Secondary | ICD-10-CM

## 2023-08-25 DIAGNOSIS — Z8669 Personal history of other diseases of the nervous system and sense organs: Secondary | ICD-10-CM

## 2023-08-25 DIAGNOSIS — Z111 Encounter for screening for respiratory tuberculosis: Secondary | ICD-10-CM

## 2023-08-25 MED ORDER — APIXABAN 5 MG PO TABS: 5.0000 mg | ORAL_TABLET | Freq: Two times a day (BID) | ORAL | 1 refills | Status: DC

## 2023-08-25 MED ORDER — NITROGLYCERIN 0.4 MG SL SUBL: 0.4000 mg | SUBLINGUAL_TABLET | SUBLINGUAL | 3 refills | Status: AC | PRN

## 2023-08-25 MED ORDER — OXYCODONE HCL 10 MG PO TABS: 10.0000 mg | ORAL_TABLET | Freq: Three times a day (TID) | ORAL | 0 refills | Status: AC | PRN

## 2023-08-25 MED ORDER — SERTRALINE HCL 25 MG PO TABS: 25.0000 mg | ORAL_TABLET | Freq: Every day | ORAL | 0 refills | Status: DC

## 2023-08-25 NOTE — Progress Notes (Signed)
 Subjective:    Patient ID: [name], female    DOB: 23-Nov-1965, 58 y.o.   MRN: [id number]  HPI  [name] is a 58 year old female with type 2 diabetes, hypertension, and chronic back pain who presents for medication management and follow-up.  She has type 2 diabetes managed with Ozempic  0.5 mg weekly, metformin  500 mg daily, and Basaglar  40 units. Her last A1c was 'seven something', and she has been on Ozempic  for about six to seven months.  Hypertension is managed with amlodipine  and carvedilol  25 mg twice daily. She recently had a lapse in amlodipine  but uses carvedilol  regularly.  She has high cholesterol managed with atorvastatin  80 mg daily.  She is on Eliquis  due to a history of pulmonary embolism and questions the necessity of continuation as it has been six months since initiation.  Chronic back pain is significant, managed with oxycodone . The pain is primarily in the lower back, occasionally radiating to the right leg, causing difficulty in mobility without medication.  She experiences anxiety and takes Buspar  15 mg twice daily, though she feels it may not be effective. She has not had Buspar  recently and needs medication for anxiety.  She has a history of atypical chest pain and was previously prescribed nitroglycerin , though she has not had chest pain recently and questions the need for continued use.  She has a history of migraines, previously managed with Imitrex , but has not had recent episodes.  She takes gabapentin  300 mg, two capsules three times a day for neuropathy and radiating pain, though she recently had issues with refills.   Review of Systems See hpi    Objective:   Physical Exam  General Mental Status- Alert. General Appearance- Not in acute distress.   Skin General: Color- Normal Color. Moisture- Normal Moisture.  Neck Carotid Arteries- Normal color. Moisture- Normal Moisture. No carotid bruits. No JVD.  Chest and Lung  Exam Auscultation: Breath Sounds:-Normal.  Cardiovascular Auscultation:Rythm- Regular. Murmurs & Other Heart Sounds:Auscultation of the heart reveals- No Murmurs.  Abdomen Inspection:-Inspeection Normal. Palpation/Percussion:Note:No mass. Palpation and Percussion of the abdomen reveal- Non Tender, Non Distended + BS, no rebound or guarding.   Neurologic Cranial Nerve exam:- CN III-XII intact(No nystagmus), symmetric smile. Strength:- 5/5 equal and symmetric strength both upper and lower extremities.       Assessment & Plan:   Patient Instructions  Type 2 Diabetes Mellitus with Hyperglycemia Suboptimal glycemic control with A1c of 7. Requires endocrinology follow-up. - Continue Ozempic , Metformin , and Basaglar . - Follow up with endocrinologist.  Hypertension Blood pressure well-controlled with current regimen. - Continue Amlodipine  and Carvedilol .  Hyperlipidemia Managed with atorvastatin  - Continue atorvastatin  80 mg daily.  Chronic Back Pain Managed with Oxycodone , necessary for mobility. - Continue Oxycodone . -up to date on contract and uds  Neuropathic Pain Managed with Gabapentin , ensure prescription refills. - Ensure Gabapentin  prescription is up to date with refills available.  Anxiety and Depression Buspar  ineffective, Sertraline  discussed as alternative. - Start Sertraline  25 mg daily. - Follow up in one month to assess response to Sertraline .  History of Pulmonary Embolism Review hematologist's notes for Eliquis  duration. - Review hematologist's notes to determine the need for continued Eliquis .  Chest Pain No recent pain, lacks Nitroglycerin  on hand. - Refill Nitroglycerin  prescription. - Instruct to use Nitroglycerin  for chest pain and seek emergency care if pain persists.  General Health Maintenance Requires intermittent home health care. - Complete paperwork for intermittent home health care.  Follow-up Regular  follow-up needed for chronic  condition management.(One month for follow up for depression) - Schedule follow-up appointment in one month to assess response to Sertraline . - Ensure regular follow-up every three months for chronic condition management.   Kaesyn Johnston, PA-C   Time spent with patient today was  45 minutes which consisted of chart revdiew, discussing diagnosis, work up treatment and documentation. Extra time taken starting to fill out majority of complicated home health form.  Nahmir Zeidman, PA-C

## 2023-08-25 NOTE — Patient Instructions (Signed)
 Type 2 Diabetes Mellitus with Hyperglycemia Suboptimal glycemic control with A1c of 7. Requires endocrinology follow-up. - Continue Ozempic , Metformin , and Basaglar . - Follow up with endocrinologist.  Hypertension Blood pressure well-controlled with current regimen. - Continue Amlodipine  and Carvedilol .  Hyperlipidemia Managed with atorvastatin  - Continue atorvastatin  80 mg daily.  Chronic Back Pain Managed with Oxycodone , necessary for mobility. - Continue Oxycodone . -up to date on contract and uds  Neuropathic Pain Managed with Gabapentin , ensure prescription refills. - Ensure Gabapentin  prescription is up to date with refills available.  Anxiety and Depression Buspar  ineffective, Sertraline  discussed as alternative. - Start Sertraline  25 mg daily. - Follow up in one month to assess response to Sertraline .  History of Pulmonary Embolism Review hematologist's notes for Eliquis  duration. - Review hematologist's notes to determine the need for continued Eliquis .  Chest Pain No recent pain, lacks Nitroglycerin  on hand. - Refill Nitroglycerin  prescription. - Instruct to use Nitroglycerin  for chest pain and seek emergency care if pain persists.  General Health Maintenance Requires intermittent home health care. - Complete paperwork for intermittent home health care.  Follow-up Regular follow-up needed for chronic condition management.(One month for follow up for depression) - Schedule follow-up appointment in one month to assess response to Sertraline . - Ensure regular follow-up every three months for chronic condition management.

## 2023-08-25 NOTE — Telephone Encounter (Signed)
 Pharmacy Patient Advocate Encounter   Received notification from CoverMyMeds that prior authorization for oxyCODONE  HCl 10MG  tablets is required/requested.   Insurance verification completed.   The patient is insured through [employer] .   Per test claim: PA required; PA submitted to above mentioned insurance via CoverMyMeds Key/confirmation #/EOC [id number] Status is pending

## 2023-08-26 NOTE — Telephone Encounter (Signed)
 Pharmacy Patient Advocate Encounter  Received notification from [facility] that Prior Authorization for OXYCODONE  10MG  TABS has been APPROVED from 08/25/23 to 02/21/24   PA #/Case ID/Reference #: [id number]

## 2023-08-27 ENCOUNTER — Telehealth: Payer: Self-pay | Admitting: Medical

## 2023-08-27 NOTE — Telephone Encounter (Signed)
 Copied from CRM [id number]. Topic: General - Other >> Aug 27, 2023 11:10 AM Viola F wrote: Reason for CRM: Patient was seen 08/25/23 GLENWOOD Sprinkle from [facility] called to follow up on independent assessment of medical need service form that was left to be filled out. Her call back number is [phone] and she asked if the form can be faxed to [phone].

## 2023-08-28 ENCOUNTER — Telehealth: Payer: Self-pay | Admitting: Medical

## 2023-08-28 NOTE — Telephone Encounter (Signed)
 faxed

## 2023-08-28 NOTE — Telephone Encounter (Signed)
 I filled out the form for this pt best as can. Not familiar with this type form. If any address type information, office info or pt  demographic type stuff please fill out rest and have pt fill out her part. Did what I could be I am out of office for a week.

## 2023-08-31 NOTE — Telephone Encounter (Signed)
 Form faxed

## 2023-09-07 ENCOUNTER — Telehealth: Payer: Self-pay

## 2023-09-07 NOTE — Telephone Encounter (Unsigned)
 Spoke with pt and relayed your enfo Pt states that she previously tested positive with ppd that's why the need a cxr  I have scheduled her for lab today      Copied from CRM #[id number]. 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 Needed asap

## 2023-09-07 NOTE — Telephone Encounter (Signed)
 Copied from CRM [id number]. Topic: General - Other >> Sep 07, 2023 11:35 AM Berneda FALCON wrote: Reason for CRM: Janelle from [facility] care states that patient would like to request Personal care services -patient feels she would qualify for it and wants provider to initiate this process for her. She has Medicaid [employer].  Please callback patient at [phone]  [facility] Nurse care manager with community [phone]

## 2023-09-08 ENCOUNTER — Other Ambulatory Visit (INDEPENDENT_AMBULATORY_CARE_PROVIDER_SITE_OTHER)

## 2023-09-08 ENCOUNTER — Telehealth: Payer: Self-pay

## 2023-09-08 ENCOUNTER — Ambulatory Visit (HOSPITAL_BASED_OUTPATIENT_CLINIC_OR_DEPARTMENT_OTHER)
Admission: RE | Admit: 2023-09-08 | Discharge: 2023-09-08 | Disposition: A | Discharge status: Home / Self Care | Source: Ambulatory Visit | Attending: Medical | Admitting: Medical

## 2023-09-08 DIAGNOSIS — Z111 Encounter for screening for respiratory tuberculosis: Secondary | ICD-10-CM | POA: Diagnosis present

## 2023-09-08 NOTE — Addendum Note (Signed)
 Addended by: DORINA DALLAS HERO on: 09/08/2023 06:20 AM   Modules accepted: Orders

## 2023-09-08 NOTE — Telephone Encounter (Signed)
 Copied from CRM [id number]. Topic: General - Other >> Sep 08, 2023  2:06 PM Thersia BROCKS wrote: Reason for CRM: Patient called in wanted nurse to give her a call once TB results are avaliable so she can get them for her employer

## 2023-09-08 NOTE — Addendum Note (Signed)
 Addended by: DORINA DALLAS HERO on: 09/08/2023 06:13 AM   Modules accepted: Orders

## 2023-09-10 ENCOUNTER — Ambulatory Visit: Payer: Self-pay

## 2023-09-10 NOTE — Telephone Encounter (Signed)
 FYI Only or Action Required?: Action required by provider: request for appointment and medication refill request.  Patient was last seen in primary care on 08/25/2023 by Dorina Loving, PA-C.  Called Nurse Triage reporting Arm Pain.  Symptoms began several weeks ago.  Interventions attempted: Nothing.  Symptoms are: gradually worsening. Left arm pain, 2 weeks but getting worse. Hurts to lift arm. No injury.  Triage Disposition: See PCP When Office is Open (Within 3 Days)  Patient/caregiver understands and will follow disposition?: Yes   Copied from CRM #[id number]. Topic: Clinical - Red Word Triage >> Sep 10, 2023 10:28 AM Lavanda D wrote: Red Word that prompted transfer to Nurse Triage: Left arm has been bothering her, very painful to lift it/do anything and it is gradually getting worse. Been going on for a little while but she is not sure when it started.    ----------------------------------------------------------------------- From previous Reason for Contact - Scheduling: Patient/patient representative is calling to schedule an appointment. Refer to attachments for appointment information. Reason for Disposition  [1] MODERATE pain (e.g., interferes with normal activities) AND [2] present > 3 days  Answer Assessment - Initial Assessment Questions 1. ONSET: When did the pain start?     2 weeks , getting worse 2. LOCATION: Where is the pain located?     Left 3. PAIN: How bad is the pain? (Scale 0-10; or none, mild, moderate, severe)     7-8 4. WORK OR EXERCISE: Has there been any recent work or exercise that involved this part of the body?     no 5. CAUSE: What do you think is causing the arm pain?     unsure 6. OTHER SYMPTOMS: Do you have any other symptoms? (e.g., neck pain, swelling, rash, fever, numbness, weakness)    Mild swelling 7. PREGNANCY: Is there any chance you are pregnant? When was your last menstrual period?     no  Protocols used: Arm  Pain-A-AH

## 2023-09-10 NOTE — Telephone Encounter (Signed)
 Appt scheduled

## 2023-09-11 ENCOUNTER — Telehealth: Payer: Self-pay | Admitting: *Deleted

## 2023-09-11 ENCOUNTER — Ambulatory Visit: Payer: Self-pay | Admitting: Student

## 2023-09-11 ENCOUNTER — Ambulatory Visit: Payer: Self-pay | Admitting: Medical

## 2023-09-11 ENCOUNTER — Ambulatory Visit (INDEPENDENT_AMBULATORY_CARE_PROVIDER_SITE_OTHER): Admitting: Student

## 2023-09-11 ENCOUNTER — Ambulatory Visit (HOSPITAL_BASED_OUTPATIENT_CLINIC_OR_DEPARTMENT_OTHER)
Admission: RE | Admit: 2023-09-11 | Discharge: 2023-09-11 | Disposition: A | Discharge status: Home / Self Care | Source: Ambulatory Visit | Attending: Student | Admitting: Student

## 2023-09-11 ENCOUNTER — Encounter: Payer: Self-pay | Admitting: Student

## 2023-09-11 VITALS — BP 120/80 | HR 91 | Temp 98.3°F | Resp 12 | Ht 73.0 in | Wt 165.0 lb

## 2023-09-11 DIAGNOSIS — E1165 Type 2 diabetes mellitus with hyperglycemia: Secondary | ICD-10-CM

## 2023-09-11 DIAGNOSIS — M5412 Radiculopathy, cervical region: Secondary | ICD-10-CM

## 2023-09-11 DIAGNOSIS — K219 Gastro-esophageal reflux disease without esophagitis: Secondary | ICD-10-CM | POA: Diagnosis not present

## 2023-09-11 DIAGNOSIS — G8929 Other chronic pain: Secondary | ICD-10-CM | POA: Diagnosis present

## 2023-09-11 DIAGNOSIS — M25512 Pain in left shoulder: Secondary | ICD-10-CM | POA: Diagnosis present

## 2023-09-11 DIAGNOSIS — M5410 Radiculopathy, site unspecified: Secondary | ICD-10-CM

## 2023-09-11 LAB — QUANTIFERON-TB GOLD PLUS
Mitogen-NIL: 9.59 [IU]/mL
NIL: 0.01 [IU]/mL
QuantiFERON-TB Gold Plus: NEGATIVE
TB1-NIL: 0 [IU]/mL
TB2-NIL: 0 [IU]/mL

## 2023-09-11 MED ORDER — PREDNISONE 20 MG PO TABS: 40.0000 mg | ORAL_TABLET | Freq: Every day | ORAL | 0 refills | Status: AC

## 2023-09-11 MED ORDER — KETOROLAC TROMETHAMINE 30 MG/ML IJ SOLN: 30.0000 mg | Freq: Once | INTRAMUSCULAR | Status: DC

## 2023-09-11 MED ORDER — MELOXICAM 7.5 MG PO TABS: 7.5000 mg | ORAL_TABLET | Freq: Every day | ORAL | 0 refills | Status: DC

## 2023-09-11 MED ORDER — KETOROLAC TROMETHAMINE 30 MG/ML IJ SOLN
30.0000 mg | Freq: Once | INTRAMUSCULAR | Status: AC
  Administered 2023-09-11: 30 mg via INTRAMUSCULAR

## 2023-09-11 MED ORDER — PANTOPRAZOLE SODIUM 40 MG PO TBEC: 40.0000 mg | DELAYED_RELEASE_TABLET | Freq: Every day | ORAL | 3 refills | Status: DC

## 2023-09-11 NOTE — Assessment & Plan Note (Addendum)
 Patient has a history of left shoulder pain, but now reports worsening symptoms with radiation down the left arm. Xray Left shoulder and C Spine pending. Rx Meloxicam  7.5 mg daily. Prednisone  x 5 days.Administer low dose Toradol  30mg  today. Check CMP for kindey fx. Medication and common side effects reviewed with the patient; patient voiced understanding and had no further questions at this time. Do not take additional NSAIDS with this medication. Use OTC acetaminophen  for symptoms, up to 3000 mg daily.  Recommend rest and avoidance of heavy lifting or overhead activities. Apply ice or heat to the affected area 15-20 minutes, 2-3 times daily as needed for pain relief.  Consider referral to sports medicine or PT depending on X-ray results and lab findings.

## 2023-09-11 NOTE — Progress Notes (Signed)
 Acute Office Visit  Subjective:     Patient ID: [name], female    DOB: 17-Apr-1965, 58 y.o.   MRN: [id number]  Chief Complaint  Patient presents with   left arm pain    HPI  [name] is a 58 y.o. female with PMHx HTN, HLD, CAD, CKD, DM, PUD, cardiomyopathy,  chronic back pain, and anxiety. Patient is in today for left arm and shoulder pain.  Reports moderate pain 7/10.  Mild swelling.  Denies injury, fall, and trauma to shoulder.  Onset 3 weeks ago. The pain is severe, rated at 7 out of 10, and is localized to the front and back of the shoulder. The pain worse lifting the arm above the head, rotating, reaching, abduction.  Reports pain radiating down left arm to hand. Left arm weakness.  Patient denies fever, chills, SOB, CP, palpitations, dyspnea, edema, HA, vision changes, N/V/D, abdominal pain, urinary symptoms, rash, weight changes, and recent illness or hospitalizations.     ROS      Objective:    BP 120/80 (BP Location: Left Arm, Patient Position: Sitting, Cuff Size: Normal)   Pulse 91   Temp 98.3 F (36.8 C) (Oral)   Resp 12   Ht 6' 1 (1.854 m)   Wt 165 lb (74.8 kg)   SpO2 94%   BMI 21.77 kg/m    Physical Exam  General: No acute distress. Awake and conversant.  Eyes: Normal conjunctiva, anicteric. Round symmetric pupils.  ENT: Hearing grossly intact. No nasal discharge.  Neck: Neck is supple. No masses or thyromegaly.  Respiratory: CTAB. Respirations are non-labored. No wheezing.  Skin: Warm. No rashes or ulcers.  Psych: Alert and oriented. Cooperative, Appropriate mood and affect, Normal judgment.  CV: RRR. No murmur. No lower extremity edema.  MSK: Normal ambulation. No clubbing or cyanosis.  Weakness left shoulder 3/5, pain with active motion- worse with abduction and flexion above 45 degrees. Minimal swelling. No ecchymosis.  Neuro:  CN II-XII grossly normal.    No results found for any visits on 09/11/23.      Assessment &  Plan:   Problem List Items Addressed This Visit     Chronic left shoulder pain - Primary   Patient has a history of left shoulder pain, but now reports worsening symptoms with radiation down the left arm. Xray Left shoulder and C Spine pending. Rx Meloxicam  7.5 mg daily. Prednisone  x 5 days.Administer low dose Toradol  30mg  today. Check CMP for kindey fx. Medication and common side effects reviewed with the patient; patient voiced understanding and had no further questions at this time. Do not take additional NSAIDS with this medication. Use OTC acetaminophen  for symptoms, up to 3000 mg daily.  Recommend rest and avoidance of heavy lifting or overhead activities. Apply ice or heat to the affected area 15-20 minutes, 2-3 times daily as needed for pain relief.  Consider referral to sports medicine or PT depending on X-ray results and lab findings.        Relevant Medications   meloxicam  (MOBIC ) 7.5 MG tablet   predniSONE  (DELTASONE ) 20 MG tablet   Other Relevant Orders   DG Shoulder Left   GERD   Relevant Medications   pantoprazole  (PROTONIX ) 40 MG tablet   Type 2 diabetes mellitus with hyperglycemia (HCC)   Relevant Orders   Comp Met (CMET)   Other Visit Diagnoses       Radicular pain of shoulder         Radiculopathy  affecting upper extremity       Relevant Orders   DG Cervical Spine Complete      CKD Check kidney function/ CMP today.        Meds ordered this encounter  Medications   DISCONTD: ketorolac  (TORADOL ) 30 MG/ML injection 30 mg   meloxicam  (MOBIC ) 7.5 MG tablet    Sig: Take 1 tablet (7.5 mg total) by mouth daily.    Dispense:  15 tablet    Refill:  0    Supervising Provider:   DOMENICA BLACKBIRD A [[id number]]   pantoprazole  (PROTONIX ) 40 MG tablet    Sig: Take 1 tablet (40 mg total) by mouth daily.    Dispense:  30 tablet    Refill:  3    Supervising Provider:   DOMENICA BLACKBIRD A [[id number]]   predniSONE  (DELTASONE ) 20 MG tablet    Sig: Take 2 tablets (40 mg total)  by mouth daily with breakfast for 5 days.    Dispense:  10 tablet    Refill:  0    Supervising Provider:   DOMENICA BLACKBIRD A [[id number]]   ketorolac  (TORADOL ) 30 MG/ML injection 30 mg    Return in about 4 weeks (around 10/09/2023), or follow up PCP CPE.  [name] L Brenda Mumford, NP

## 2023-09-11 NOTE — Telephone Encounter (Signed)
 Called and left message for patient to come back to get XRAY of shoulder and cervical spine.  Was advised by provider after pt had left that she needed to get xray.

## 2023-09-11 NOTE — Telephone Encounter (Signed)
 Pt came back.

## 2023-09-12 ENCOUNTER — Other Ambulatory Visit: Payer: Self-pay | Admitting: Medical

## 2023-09-15 NOTE — Telephone Encounter (Unsigned)
 Copied from CRM [id number]. Topic: General - Other >> Sep 07, 2023 11:35 AM Berneda FALCON wrote: Reason for CRM: Janelle from [facility] care states that patient would like to request Personal care services -patient feels she would qualify for it and wants provider to initiate this process for her. She has Medicaid [employer].  Please callback patient at [phone]  [facility] Nurse care manager with community [phone] >> Sep 15, 2023 11:21 AM Deleta RAMAN wrote: Following up regarding concerns of the personal care services. Please contact Janelle at [phone]. Aware of call back time

## 2023-09-17 ENCOUNTER — Ambulatory Visit (HOSPITAL_COMMUNITY): Payer: Self-pay

## 2023-09-17 ENCOUNTER — Ambulatory Visit (HOSPITAL_COMMUNITY): Admission: EM | Admit: 2023-09-17 | Discharge: 2023-09-17 | Disposition: A | Discharge status: Home / Self Care

## 2023-09-17 ENCOUNTER — Ambulatory Visit: Payer: Self-pay

## 2023-09-17 ENCOUNTER — Telehealth: Payer: Self-pay | Admitting: Medical

## 2023-09-17 ENCOUNTER — Encounter (HOSPITAL_COMMUNITY): Payer: Self-pay

## 2023-09-17 DIAGNOSIS — N3000 Acute cystitis without hematuria: Secondary | ICD-10-CM | POA: Diagnosis present

## 2023-09-17 DIAGNOSIS — M545 Low back pain, unspecified: Secondary | ICD-10-CM

## 2023-09-17 DIAGNOSIS — G8929 Other chronic pain: Secondary | ICD-10-CM | POA: Diagnosis present

## 2023-09-17 LAB — POCT URINALYSIS DIP (MANUAL ENTRY)
Blood, UA: NEGATIVE
Glucose, UA: NEGATIVE mg/dL
Ketones, POC UA: NEGATIVE mg/dL
Nitrite, UA: POSITIVE — AB
Protein Ur, POC: 100 mg/dL — AB
Spec Grav, UA: 1.025 (ref 1.010–1.025)
Urobilinogen, UA: 1 U/dL
pH, UA: 5.5 (ref 5.0–8.0)

## 2023-09-17 MED ORDER — CEFUROXIME AXETIL 250 MG PO TABS: 250.0000 mg | ORAL_TABLET | Freq: Two times a day (BID) | ORAL | 0 refills | Status: AC

## 2023-09-17 MED ORDER — BACLOFEN 10 MG PO TABS: 10.0000 mg | ORAL_TABLET | Freq: Three times a day (TID) | ORAL | 0 refills | Status: DC

## 2023-09-17 MED ORDER — BACLOFEN 10 MG PO TABS: 10.0000 mg | ORAL_TABLET | Freq: Three times a day (TID) | ORAL | 3 refills | Status: DC | PRN

## 2023-09-17 MED ORDER — KETOROLAC TROMETHAMINE 30 MG/ML IJ SOLN
30.0000 mg | Freq: Once | INTRAMUSCULAR | Status: AC
  Administered 2023-09-17: 30 mg via INTRAMUSCULAR

## 2023-09-17 MED ORDER — KETOROLAC TROMETHAMINE 30 MG/ML IJ SOLN
INTRAMUSCULAR | Status: AC
  Filled 2023-09-17: qty 1

## 2023-09-17 NOTE — Discharge Instructions (Addendum)
 1. Chronic left-sided low back pain without sciatica (Primary) - ketorolac  (TORADOL ) 30 MG/ML injection 30 mg given in UC for acute on chronic left lower back pain - baclofen  (LIORESAL ) 10 MG tablet; Take 1 tablet (10 mg total) by mouth 3 (three) times daily.  Dispense: 30 each; Refill: 0  2. Acute cystitis without hematuria - POCT urinalysis dipstick shows leukocytes, nitrite, no blood, these findings are possibly indicative of urinary tract infection - Urine Culture collected in UC and sent to lab for further testing results should be available in 2 to 3 days. - cefUROXime  (CEFTIN ) 250 MG tablet; Take 1 tablet (250 mg total) by mouth 2 (two) times daily with a meal for 7 days.  Dispense: 14 tablet; Refill: 0

## 2023-09-17 NOTE — Telephone Encounter (Signed)
 Copied from CRM [id number]. Topic: Clinical - Medication Refill >> Sep 17, 2023  1:50 PM Paige D wrote: Medication: baclofen  (LIORESAL ) 10 MG tablet  & the muscle relaxer pt can not remember the name of it. Says    Has the patient contacted their pharmacy? No (Agent: If no, request that the patient contact the pharmacy for the refill. If patient does not wish to contact the pharmacy document the reason why and proceed with request.) (Agent: If yes, when and what did the pharmacy advise?)  This is the patient's preferred pharmacy:  [id number] - [place] ([place]), [place] - [address] [place] ([place]) [place] [address] Phone: [phone] Fax: [phone]   Is this the correct pharmacy for this prescription? Yes If no, delete pharmacy and type the correct one.   Has the prescription been filled recently? No  Is the patient out of the medication? Yes  Has the patient been seen for an appointment in the last year OR does the patient have an upcoming appointment? Yes  Can we respond through MyChart? No  Agent: Please be advised that Rx refills may take up to 3 business days. We ask that you follow-up with your pharmacy.

## 2023-09-17 NOTE — ED Triage Notes (Signed)
 Patient presenting with low back pain onset yesterday. Patient states she does have a history of back pain but denies any recent falls, injuries, or heavy lifting.  Denies any urinary symptoms.  Prescriptions or OTC medications tried: Yes- prescription pain meds, Ibuprofen, Naproxen , and tylenol     with no relief

## 2023-09-17 NOTE — Telephone Encounter (Signed)
 Patient states the other muscle relaxer is methocarbamol .

## 2023-09-17 NOTE — ED Provider Notes (Signed)
 [facility]  Note:  This document was prepared using Dragon voice recognition software and may include unintentional dictation errors.  MRN: [id number] DOB: 11-28-1965  Subjective:   [name] is a 58 y.o. female presenting for left lower back pain since yesterday.  Patient reports that she has a history of lower back pain but denies any recent injury or trauma to the lower back.  Patient states that she has been taking previously prescribed pain medication, ibuprofen, naproxen , Tylenol  with minimal to no relief.  Patient has not lifted anything heavy or had any recent incidents that she can remember.  Patient denies any dysuria, increased urinary frequency, flank pain, abdominal pain.   Current Facility-Administered Medications:    ketorolac  (TORADOL ) 30 MG/ML injection 30 mg, 30 mg, Intramuscular, Once, Trevonne Nyland B, NP  Current Outpatient Medications:    albuterol  (VENTOLIN  HFA) 108 (90 Base) MCG/ACT inhaler, Inhale 2 puffs into the lungs every 6 (six) hours as needed for wheezing or shortness of breath., Disp: 18 g, Rfl: 2   amLODipine  (NORVASC ) 5 MG tablet, Take 1 tablet (5 mg total) by mouth daily., Disp: 90 tablet, Rfl: 3   apixaban  (ELIQUIS ) 5 MG TABS tablet, Take 1 tablet (5 mg total) by mouth 2 (two) times daily., Disp: 60 tablet, Rfl: 1   atorvastatin  (LIPITOR ) 80 MG tablet, Take 1 tablet (80 mg total) by mouth daily., Disp: 90 tablet, Rfl: 3   baclofen  (LIORESAL ) 10 MG tablet, Take 1 tablet (10 mg total) by mouth 3 (three) times daily., Disp: 30 each, Rfl: 0   busPIRone  (BUSPAR ) 15 MG tablet, Take 1 tablet (15 mg total) by mouth 2 (two) times daily., Disp: 60 tablet, Rfl: 4   carvedilol  (COREG ) 25 MG tablet, Take 1 tablet (25 mg total) by mouth 2 (two) times daily with a meal., Disp: 180 tablet, Rfl: 0   cefUROXime  (CEFTIN ) 250 MG tablet, Take 1 tablet (250 mg total) by mouth 2 (two) times daily with a meal for 7 days., Disp: 14 tablet, Rfl: 0    fluticasone  furoate-vilanterol (BREO ELLIPTA ) 100-25 MCG/INH AEPB, Inhale 1 puff into the lungs daily. (Patient taking differently: Inhale 1 puff into the lungs daily as needed (Asthma).), Disp: 100 each, Rfl: 2   gabapentin  (NEURONTIN ) 300 MG capsule, Take 2 capsules (600 mg total) by mouth 3 (three) times daily., Disp: 270 capsule, Rfl: 0   Insulin  Glargine (BASAGLAR  KWIKPEN) 100 UNIT/ML, INJECT 40 UNITS SUBCUTANEOUSLY AT BEDTIME, Disp: 12 mL, Rfl: 0   Insulin  Pen Needle (PEN NEEDLES 31GX5/16) 31G X 8 MM MISC, Use daily as directed, Disp: 100 each, Rfl: 0   meloxicam  (MOBIC ) 7.5 MG tablet, Take 1 tablet (7.5 mg total) by mouth daily., Disp: 15 tablet, Rfl: 0   metFORMIN  (GLUCOPHAGE -XR) 500 MG 24 hr tablet, Take 1 tablet (500 mg total) by mouth daily with breakfast., Disp: 60 tablet, Rfl: 0   Oxycodone  HCl 10 MG TABS, Take 1 tablet (10 mg total) by mouth 3 (three) times daily as needed for severe pain, Disp: 90 tablet, Rfl: 0   OZEMPIC , 0.25 OR 0.5 MG/DOSE, 2 MG/3ML SOPN, Inject 0.5 mg into the skin once a week. mondays, Disp: , Rfl:    promethazine  (PHENERGAN ) 12.5 MG tablet, Take 1 tablet (12.5 mg total) by mouth at bedtime., Disp: 45 tablet, Rfl: 2   sertraline  (ZOLOFT ) 25 MG tablet, Take 1 tablet (25 mg total) by mouth daily., Disp: 30 tablet, Rfl: 0   topiramate  (TOPAMAX ) 50 MG tablet, Take  1 tablet (50 mg total) by mouth 2 (two) times daily. (Patient taking differently: Take 50 mg by mouth 2 (two) times daily as needed (headache).), Disp: 60 tablet, Rfl: 12   VITAMIN D PO, Take 5,000 Units by mouth daily., Disp: , Rfl:    nitroGLYCERIN  (NITROSTAT ) 0.4 MG SL tablet, Place 1 tablet (0.4 mg total) under the tongue every 5 (five) minutes as needed for chest pain., Disp: 30 tablet, Rfl: 3   pantoprazole  (PROTONIX ) 40 MG tablet, Take 1 tablet (40 mg total) by mouth daily., Disp: 30 tablet, Rfl: 3   Allergies  Allergen Reactions   Lisinopril  Swelling    Angioedema   Oxycodone -Acetaminophen  Nausea  Only   Latex Itching, Swelling and Rash   Morphine Itching and Rash    Past Medical History:  Diagnosis Date   Abdominal pain with vomiting    ABDOMINAL PAIN-EPIGASTRIC 09/14/2009   Qualifier: Diagnosis of   By: [name], [name]        Abnormal CT scan 11/05/2022   Abnormal Pap smear of cervix    Abscess of left lower leg    Acute cystitis 07/07/2017   Acute kidney injury superimposed on chronic kidney disease (HCC) 04/22/2021   Acute pulmonary embolism (HCC) 01/30/2023   Acute pyelonephritis 08/02/2022   Acute renal failure superimposed on stage 3a chronic kidney disease (HCC) 11/04/2022   Acute respiratory failure with hypoxia (HCC) 01/31/2023   ANKLE PAIN, LEFT 12/25/2009   Qualifier: Diagnosis of   By: Adella MD, [name]         Anxiety 01/22/2016   Asthma    Asthma without acute exacerbation 01/31/2023   CAD (coronary artery disease) 07/07/2017   Cardiac murmur 02/01/2019   Cardiomegaly 01/31/2023   Cardiomyopathy, secondary (HCC) 09/06/2009   Qualifier: Diagnosis of  By: [name]   Formatting of this note might be different from the original. Overview:  Qualifier: Diagnosis of  By: [name]   Cardiomyopathy, unspecified (HCC) 09/06/2009   Formatting of this note might be different from the original.  Overview:   Overview:   Qualifier: Diagnosis of   By: [name]     Overview:   Overview:   Qualifier: Diagnosis of   By: [name]  Formatting of this note might be different from the original.  Overview:   Qualifier: Diagnosis of   By: [name]     Cataract    Cellulitis of left leg 04/22/2021   Cerebrovascular disease 09/13/2012   Cervical cancer (HCC) 2001   Radical hysterectomy in [facility]   CHEST PAIN, ATYPICAL 07/30/2009   Qualifier: Diagnosis of   By: Adella MD, [name] of this note might be different from the original.  Overview:    Qualifier: Diagnosis of   By: Adella MD, [name]     Chest pain, unspecified 04/22/2021   Chronic back pain    Chronic diastolic HF (heart failure) (HCC) 05/21/2021   Chronic kidney disease, stage III (moderate) (HCC) 05/23/2014   Chronic pain syndrome 01/23/2016   Coffee ground emesis 11/05/2022   Coronary artery disease    30% lesions noted   Current use of insulin  (HCC) 05/23/2014   Dehydration 08/02/2022   Depression    Diabetes type 2, uncontrolled 05/23/2014   Diabetic gastroparesis associated with type 1 diabetes mellitus (HCC) 11/27/2016   DIABETIC PERIPHERAL NEUROPATHY 09/21/2009   Qualifier: Diagnosis of  ByBETHA Bolds MD, [name]     Diabetic peripheral neuropathy (HCC) 01/23/2016   DM2 (diabetes mellitus, type 2) (HCC) 07/30/2009   Qualifier: Diagnosis of   By: Bolds MD, [name]         Dyslipidemia 02/01/2019   Dysuria 12/25/2009   Qualifier: Diagnosis of   By: Bolds MD, Almarie         Essential hypertension 08/30/2009   Qualifier: Diagnosis of  By: Leana, CNA, Christy     Gastric atony 07/30/2009   Formatting of this note might be different from the original.  Overview:   Qualifier: Diagnosis of   By: Bolds MD, [name]     Gastroesophageal reflux disease 07/30/2009   Formatting of this note might be different from the original. Overview:  Overview:  Qualifier: Diagnosis of  By: Bolds MD, Almarie Dan: Diagnosis of  By: Kerman NP, Vina Deal of this note might be different from the original. Overview:  Qualifier: Diagnosis of  By: Bolds MD, Almarie Dan: Diagnosis of  By: Kerman NP, Vina   Gastroparesis 07/30/2009   Qualifier: Diagnosis of   By: Bolds MD, [name]         GERD 07/30/2009   Qualifier: Diagnosis of   By: Bolds MD, Almarie Dan: Diagnosis of   By: Kerman NP, Vina         GERD (gastroesophageal reflux disease)    High anion gap metabolic acidosis 08/02/2022   History of cervical  cancer 08/17/2015   Status post partial hysterectomy  Formatting of this note might be different from the original. Status post partial hysterectomy   HLD (hyperlipidemia) 07/30/2009   Qualifier: Diagnosis of   By: Bolds MD, [name]         Hyperlipidemia    Hyperlipidemia, unspecified 07/30/2009   Formatting of this note might be different from the original.  Overview:   Qualifier: Diagnosis of   By: Bolds MD, [name] of this note might be different from the original.  Overview:   Qualifier: Diagnosis of   By: Bolds MD, [name]     Hypertension    Hypertensive urgency 08/30/2009   Qualifier: Diagnosis of   By: Leana, CNA, Christy         Hypomagnesemia 01/31/2023   INSOMNIA 09/21/2009   Qualifier: Diagnosis of  By: Bolds MD, [name]     Intractable nausea and vomiting 08/03/2022   Lumbar facet arthropathy 05/14/2016   Lumbar spinal stenosis 02/11/2018   Metatarsalgia of both feet 03/11/2017   Migraine 09/13/2012   Overview:  IMPRESSION: possible abd migraine   Mood disorder (HCC) 01/05/2022   Nausea & vomiting 07/07/2017   Nausea with vomiting, unspecified 09/14/2009   Qualifier: Diagnosis of   By: Ever Riggers, [name]        Formatting of this note might be different from the original.  Overview:   Qualifier: Diagnosis of   By: Ever Riggers, [name]     Neuropathy 07/20/2017   Overview:   Diabetic     Formatting of this note might be different from the original.  Diabetic     Nonspecific abnormal findings on radiological and examination of skull and head 09/13/2012   Normocytic anemia 01/31/2023   Osteomyelitis of second toe of left foot (HCC) 01/05/2022   Overweight (BMI 25.0-29.9) 07/21/2014   Pain of left calf 04/19/2021   PUD (peptic ulcer disease) 09/28/2009   Qualifier: Diagnosis of   By: Kerman  NP, Paula         Pure hypercholesterolemia 11/27/2016   Retention of urine 06/25/2011   Sepsis due to cellulitis (HCC) 04/22/2021   Severe sepsis  (HCC) 04/22/2021   SIRS (systemic inflammatory response syndrome) (HCC) 05/21/2021   Thrombocytosis 01/31/2023   TOBACCO ABUSE 09/21/2009   Qualifier: Diagnosis of  By: Adella MD, [name] of this note might be different from the original. Overview:  Qualifier: Diagnosis of  By: Adella MD, Almarie BUTTERY, SERUM, ELEVATED 09/21/2009   Qualifier: Diagnosis of   By: Adella MD, [name]         Trigeminal neuralgia    Tuberculin test reaction 07/30/2009   Formatting of this note might be different from the original.  Overview:   Annotation: CXR clear ?  diagnosed in 2/11?  On INH/pyridoxine  Qualifier: Diagnosis of   By: Adella MD, [name]     Type 2 diabetes mellitus with hyperglycemia (HCC) 07/30/2009   Qualifier: Diagnosis of   By: Adella MD, [name] of this note might be different from the original.  Overview:   Qualifier: Diagnosis of   By: Adella MD, [name]     Type 2 diabetes mellitus with stage 3 chronic kidney disease (HCC) 09/14/2009   Qualifier: Diagnosis of   By: Ever Riggers, [name]        Uncontrolled type 2 diabetes mellitus with hyperglycemia, without long-term current use of insulin  (HCC) 04/22/2021   UTI (urinary tract infection) 09/21/2009   Qualifier: Diagnosis of   By: Adella MD, Almarie OLSZEWSKI, CANDIDAL 12/25/2009   Qualifier: Diagnosis of  By: Adella MD, [name]     Viral upper respiratory tract infection 11/30/2020   Vitamin D deficiency 04/25/2013     Past Surgical History:  Procedure Laterality Date   ABDOMINAL HYSTERECTOMY     radical hysterectomy   AMPUTATION Left 01/06/2022   Procedure: AMPUTATION 2nd TOE;  Surgeon: Harden Jerona GAILS, MD;  [facility] OR;  Service: Orthopedics;  Laterality: Left;   BIOPSY  11/06/2022   Procedure: BIOPSY;  Surgeon: Federico Rosario BROCKS, MD;  Location: [facility] ENDOSCOPY;  Service: Gastroenterology;;   CHOLECYSTECTOMY     ESOPHAGOGASTRODUODENOSCOPY (EGD) WITH  PROPOFOL  N/A 11/06/2022   Procedure: ESOPHAGOGASTRODUODENOSCOPY (EGD) WITH PROPOFOL ;  Surgeon: Federico Rosario BROCKS, MD;  Location: [facility] ENDOSCOPY;  Service: Gastroenterology;  Laterality: N/A;   I & D EXTREMITY Left 04/24/2021   Procedure: LEFT LEG DEBRIDEMENT;  Surgeon: Harden Jerona GAILS, MD;  Location: [facility] OR;  Service: Orthopedics;  Laterality: Left;   I & D EXTREMITY Left 05/01/2021   Procedure: LEFT KNEE DEBRIDEMENT;  Surgeon: Harden Jerona GAILS, MD;  Location: [facility] OR;  Service: Orthopedics;  Laterality: Left;    Family History  Problem Relation Age of Onset   Breast cancer Mother    Stroke Mother    Hypertension Mother    Diabetes Mother    Hypertension Father    Diabetes Father    Heart attack Father    Hypertension Sister    Diabetes Sister    Hypertension Brother    Diabetes Brother    Hypertension Brother    Diabetes Brother    Stroke Maternal Grandmother    Colon cancer Neg Hx    Colon polyps Neg Hx    Esophageal cancer Neg Hx    Stomach cancer Neg Hx    Rectal cancer Neg Hx  Social History   Tobacco Use   Smoking status: Former    Current packs/day: 0.00    Types: Cigarettes    Quit date: 1990    Years since quitting: 35.6   Smokeless tobacco: Never  Vaping Use   Vaping status: Never Used  Substance Use Topics   Alcohol use: Yes    Comment: occasionally   Drug use: No    ROS Refer to HPI for ROS details.  Objective:   Vitals: BP (!) 140/91 (BP Location: Right Arm)   Pulse 97   Temp 98.6 F (37 C) (Oral)   Resp 18   Ht 6' 1 (1.854 m)   Wt 165 lb (74.8 kg)   SpO2 97%   BMI 21.77 kg/m   Physical Exam Vitals and nursing note reviewed.  Constitutional:      General: She is not in acute distress.    Appearance: Normal appearance. She is well-developed. She is not ill-appearing or toxic-appearing.  HENT:     Head: Normocephalic and atraumatic.  Cardiovascular:     Rate and Rhythm: Normal rate.  Pulmonary:     Effort: Pulmonary effort is normal. No  respiratory distress.  Abdominal:     General: There is no distension.     Palpations: Abdomen is soft.     Tenderness: There is no abdominal tenderness. There is no right CVA tenderness or left CVA tenderness.  Musculoskeletal:     Lumbar back: Spasms and tenderness present. No bony tenderness. Decreased range of motion.  Skin:    General: Skin is warm and dry.  Neurological:     General: No focal deficit present.     Mental Status: She is alert and oriented to person, place, and time.  Psychiatric:        Mood and Affect: Mood normal.        Behavior: Behavior normal.     Procedures  Results for orders placed or performed during the hospital encounter of 09/17/23 (from the past 24 hours)  POCT urinalysis dipstick     Status: Abnormal   Collection Time: 09/17/23  5:02 PM  Result Value Ref Range   Color, UA straw (A) yellow   Clarity, UA cloudy (A) clear   Glucose, UA negative negative mg/dL   Bilirubin, UA small (A) negative   Ketones, POC UA negative negative mg/dL   Spec Grav, UA 8.974 8.989 - 1.025   Blood, UA negative negative   pH, UA 5.5 5.0 - 8.0   Protein Ur, POC =100 (A) negative mg/dL   Urobilinogen, UA 1.0 0.2 or 1.0 E.U./dL   Nitrite, UA Positive (A) Negative   Leukocytes, UA Small (1+) (A) Negative    No results found.   Assessment and Plan :     Discharge Instructions      1. Chronic left-sided low back pain without sciatica (Primary) - ketorolac  (TORADOL ) 30 MG/ML injection 30 mg given in UC for acute on chronic left lower back pain - baclofen  (LIORESAL ) 10 MG tablet; Take 1 tablet (10 mg total) by mouth 3 (three) times daily.  Dispense: 30 each; Refill: 0  2. Acute cystitis without hematuria - POCT urinalysis dipstick shows leukocytes, nitrite, no blood, these findings are possibly indicative of urinary tract infection - Urine Culture collected in UC and sent to lab for further testing results should be available in 2 to 3 days. - cefUROXime   (CEFTIN ) 250 MG tablet; Take 1 tablet (250 mg total) by mouth 2 (two) times  daily with a meal for 7 days.  Dispense: 14 tablet; Refill: 0      Ethel KATHEE Aurea Aurea, [place] B, [place] 09/17/23 1713

## 2023-09-17 NOTE — Telephone Encounter (Signed)
 FYI Only or Action Required?: FYI only for provider.  Patient was last seen in primary care on 09/11/2023 by Wheeler Harlene CROME, NP.  Called Nurse Triage reporting Back Pain and Medication Refill.  Symptoms began chronic pain, flare up since yesterday.  Interventions attempted: OTC medications: tylenol , naproxen  and Prescription medications: oxycodone .  Symptoms are: lower back paingradually worsening.  Triage Disposition: See PCP When Office is Open (Within 3 Days)  Patient/caregiver understands and will follow disposition?: Yes            Copied from CRM #[id number]. Topic: Clinical - Red Word Triage >> Sep 17, 2023  1:55 PM Paige D wrote: Red Word that prompted transfer to Nurse Triage: PT can not walk she can barley move back pain is Very severe. Pt asking to be seen today. Reason for Disposition  [1] MODERATE back pain (e.g., interferes with normal activities) AND [2] present > 3 days  Answer Assessment - Initial Assessment Questions 1. ONSET: When did the pain begin? (e.g., minutes, hours, days)     Yesterday, she states it is chronic pain. She states it's been a minute since she has taken her muscle relaxer.  2. LOCATION: Where does it hurt? (upper, mid or lower back)     Lower.  3. SEVERITY: How bad is the pain?  (e.g., Scale 1-10; mild, moderate, or severe)     7/10, she states it makes it hard to bend over, walk or stand up.  4. PATTERN: Is the pain constant? (e.g., yes, no; constant, intermittent)      Constant.  5. RADIATION: Does the pain shoot into your legs or somewhere else?     Lower back and middle waist.  6. CAUSE:  What do you think is causing the back pain?      She states she has chronic back pain.  7. BACK OVERUSE:  Any recent lifting of heavy objects, strenuous work or exercise?     No.  8. MEDICINES: What have you taken so far for the pain? (e.g., nothing, acetaminophen , NSAIDS)     Oxycodone , naproxen , Tylenol .  9.  NEUROLOGIC SYMPTOMS: Do you have any weakness, numbness, or problems with bowel/bladder control?     She states just her back every step I takes hurts. Denies any weakness, numbness or problems with bowel/bladder control.  10. OTHER SYMPTOMS: Do you have any other symptoms? (e.g., fever, abdomen pain, burning with urination, blood in urine)       No.  11. PREGNANCY: Is there any chance you are pregnant? When was your last menstrual period?       N/A.  Protocols used: Back Pain-A-AH

## 2023-09-19 LAB — URINE CULTURE
Culture: >=100000 — AB
Special Requests: NORMAL

## 2023-09-21 ENCOUNTER — Ambulatory Visit (HOSPITAL_COMMUNITY): Payer: Self-pay

## 2023-09-21 ENCOUNTER — Other Ambulatory Visit: Payer: Self-pay | Admitting: Medical

## 2023-09-21 NOTE — Telephone Encounter (Signed)
 Copied from CRM [id number]. Topic: Clinical - Medication Refill >> Sep 21, 2023  2:17 PM Suzen RAMAN wrote: Medication: carvedilol  (COREG ) 25 MG tablet  Has the patient contacted their pharmacy? Yes   This is the patient's preferred pharmacy:  [facility] [address] ([place]), [place] - [address] [place] ([place]) [place] [address] Phone: [phone] Fax: [phone]  [facility] [address] ([address]), [place] - [address] [place] ([place]) [place] [address] Phone: [phone] Fax: [phone]  [facility] [address] [place] - [address] [place] [address] Phone: [phone] Fax: [phone]  Is this the correct pharmacy for this prescription? Yes If no, delete pharmacy and type the correct one.   Has the prescription been filled recently? No  Is the patient out of the medication? Yes  Has the patient been seen for an appointment in the last year OR does the patient have an upcoming appointment? Yes  Can we respond through MyChart? Yes  Agent: Please be advised that Rx refills may take up to 3 business days. We ask that you follow-up with your pharmacy.

## 2023-09-22 MED ORDER — CARVEDILOL 25 MG PO TABS: 25.0000 mg | ORAL_TABLET | Freq: Two times a day (BID) | ORAL | 0 refills | Status: DC

## 2023-09-25 ENCOUNTER — Telehealth: Payer: Self-pay

## 2023-09-25 NOTE — Telephone Encounter (Signed)
 Spoke to pt about changing her apt to 09/29/2023 and she has agreed

## 2023-09-28 ENCOUNTER — Telehealth: Admitting: Medical

## 2023-09-29 ENCOUNTER — Telehealth (INDEPENDENT_AMBULATORY_CARE_PROVIDER_SITE_OTHER): Admitting: Medical

## 2023-09-29 ENCOUNTER — Encounter: Payer: Self-pay | Admitting: Medical

## 2023-09-29 VITALS — Ht 73.0 in | Wt 165.0 lb

## 2023-09-29 DIAGNOSIS — Z79899 Other long term (current) drug therapy: Secondary | ICD-10-CM

## 2023-09-29 DIAGNOSIS — G8929 Other chronic pain: Secondary | ICD-10-CM

## 2023-09-29 DIAGNOSIS — I10 Essential (primary) hypertension: Secondary | ICD-10-CM

## 2023-09-29 DIAGNOSIS — S98132A Complete traumatic amputation of one left lesser toe, initial encounter: Secondary | ICD-10-CM

## 2023-09-29 DIAGNOSIS — M5489 Other dorsalgia: Secondary | ICD-10-CM

## 2023-09-29 DIAGNOSIS — D649 Anemia, unspecified: Secondary | ICD-10-CM

## 2023-09-29 DIAGNOSIS — E1165 Type 2 diabetes mellitus with hyperglycemia: Secondary | ICD-10-CM | POA: Diagnosis not present

## 2023-09-29 NOTE — Progress Notes (Unsigned)
 Subjective:    Patient ID: [name], female    DOB: [birth year], 58 y.o.   MRN: [id number]  HPI  Virtual Visit via Video Note  I connected with Andrea VEAR Record on 09/30/23 at  3:20 PM EDT by a video enabled telemedicine application and verified that I am speaking with the correct person using two identifiers.  Location: Patient: home [place] Provider: office   I discussed the limitations of evaluation and management by telemedicine and the availability of in person appointments. The patient expressed understanding and agreed to proceed.  History of Present Illness: [name] is a 58 year old female with chronic back pain who presents for evaluation of her chronic pain and home health care needs.  She has been experiencing significant back pain for the past two weeks, which led to an urgent care visit where she received a Toradol  injection and a muscle relaxant. The pain was severe, making her almost unable to walk and stuck in a position. Despite taking oxycodone  and gabapentin , the pain was not alleviated, necessitating the urgent care visit. Although the pain has decreased, it still intermittently affects her mobility. Pt indicated she was concerned since pain was severe and felt like difficulty getting to UC due to pain. I think she inidicated friend took her to UC.   Her medical history includes diabetes, hypertension, high cholesterol, and neuropathic pain. She also experiences pain in her legs and residual pain from a previous left second toe amputation. Her chronic pain impacts her ability to perform daily activities, and she reports that she has been deemed totally disabled. She is not currently working but wants to remain active and possibly work part-time within the limits of her disability status.  She is seeking home health care support for times when she is unable to care for herself due to her medical conditions. She has completed and submitted paperwork for  home health paperwork she brought to me on one of her visit but has not yet received confirmation of these services.  She is currently taking oxycodone  for pain management and gabapentin  for neuropathic pain. She was also prescribed cefdinir  for a possible urinary tract infection during her recent urgent care visit.   Observations/Objective: General-no acute distress, pleasant, oriented. Lungs- on inspection lungs appear unlabored. Neck- no tracheal deviation or jvd on inspection. Neuro- gross motor function appears intact.   Assessment and Plan: Chronic low back pain with neuropathic features and difficulty with ambulation Chronic low back pain exacerbated, causing significant ambulation difficulty. Pain improved post-Toradol  and muscle relaxant but still affects ambulation. - Initiate home health referral for nursing and physical therapy assessment. - Ensure follow-up with home health services within 3-4 days.  Type 2 diabetes mellitus, Hypertension and Hyperlipidemia -Continue current tx  Urinary tract infection, unspecified Recent urinary tract infection treated with cefdinir .  Follow up 3 months or sooner if needed.  Follow Up Instructions:    I discussed the assessment and treatment plan with the patient. The patient was provided an opportunity to ask questions and all were answered. The patient agreed with the plan and demonstrated an understanding of the instructions.   The patient was advised to call back or seek an in-person evaluation if the symptoms worsen or if the condition fails to improve as anticipated.     Dallas Maxwell, PA-C    Review of Systems     Objective:   Physical Exam        Assessment & Plan:

## 2023-09-30 ENCOUNTER — Ambulatory Visit: Admitting: Orthopedic Surgery

## 2023-09-30 NOTE — Patient Instructions (Signed)
 Chronic low back pain with neuropathic features and difficulty with ambulation Chronic low back pain exacerbated, causing significant ambulation difficulty. Pain improved post-Toradol  and muscle relaxant but still affects ambulation. - Initiate home health referral for nursing and physical therapy assessment. - Ensure follow-up with home health services within 3-4 days.  Type 2 diabetes mellitus, Hypertension and Hyperlipidemia -Continue current tx  Urinary tract infection, unspecified Recent urinary tract infection treated with cefdinir .  Follow up 3 months or sooner if needed.

## 2023-10-02 ENCOUNTER — Other Ambulatory Visit: Payer: Self-pay | Admitting: Medical

## 2023-10-06 ENCOUNTER — Telehealth: Payer: Self-pay | Admitting: Medical

## 2023-10-06 NOTE — Telephone Encounter (Signed)
 Copied from CRM [id number]. Topic: Referral - Question >> Oct 06, 2023  2:54 PM [name] wrote: Reason for RMF:[id number] [name] from [facility] called on behalf of patient to follow up on referral. Patient has a referral placed for [facility] and she contacted the nearest one to patient but they have no record of it. Can referral please be refaxed to [phone]. [name] would like a call back with update once referral has been resent at [phone].

## 2023-10-07 NOTE — Telephone Encounter (Signed)
 Called to speak with [name] but only received voicemail Left a detailed message and informed her that the referral has been placed and to call with any other concerns

## 2023-10-21 NOTE — Telephone Encounter (Signed)
 Called pt this was my third attempt  Was able to leave message this time regarding her [facility] referral  Hopefully she will get the message or call us  back

## 2023-10-21 NOTE — Telephone Encounter (Signed)
 Called pot x2 and she answers the phone but then it goes blank will call her again later in regards to her home health referral

## 2023-10-30 ENCOUNTER — Telehealth: Payer: Self-pay | Admitting: Medical

## 2023-10-30 NOTE — Telephone Encounter (Signed)
 Copied from CRM [id number]. Topic: Referral - Status >> Oct 30, 2023 11:07 AM Charolett L wrote: Reason for CRM: Jenelle from [facility] [place] Calling to find out where a referral was sent to [facility] and she called them and they are stating they don't have it. Contacted office and was advised they  will resend the referral to [facility] health and waiting on a response from multiple to see who accepts the patient Meta would like a call back to follow up cb#[phone]

## 2023-10-30 NOTE — Telephone Encounter (Signed)
 I have wrote the pt via mychart hopefully she gets the message

## 2023-10-30 NOTE — Telephone Encounter (Signed)
 Pt referral from 8.20.25 needs to be resent or need orders because home health stated that they do not receive a referral for this pt.  Please advise, Thanks

## 2023-11-14 ENCOUNTER — Other Ambulatory Visit: Payer: Self-pay | Admitting: Medical

## 2023-11-30 IMAGING — DX DG FOOT COMPLETE 3+V*R*
3 series · 3 of 3 positions shown · non-contrast
Comparison: Left foot radiographs 05/14/2021 and MRI 05/21/2021.

CLINICAL DATA: Bilateral foot pain for 1 month with infection,
worse on the left. No known injury. History of diabetes.

EXAM:
RIGHT FOOT COMPLETE - 3+ VIEW; LEFT FOOT - COMPLETE 3+ VIEW

[foot ap]
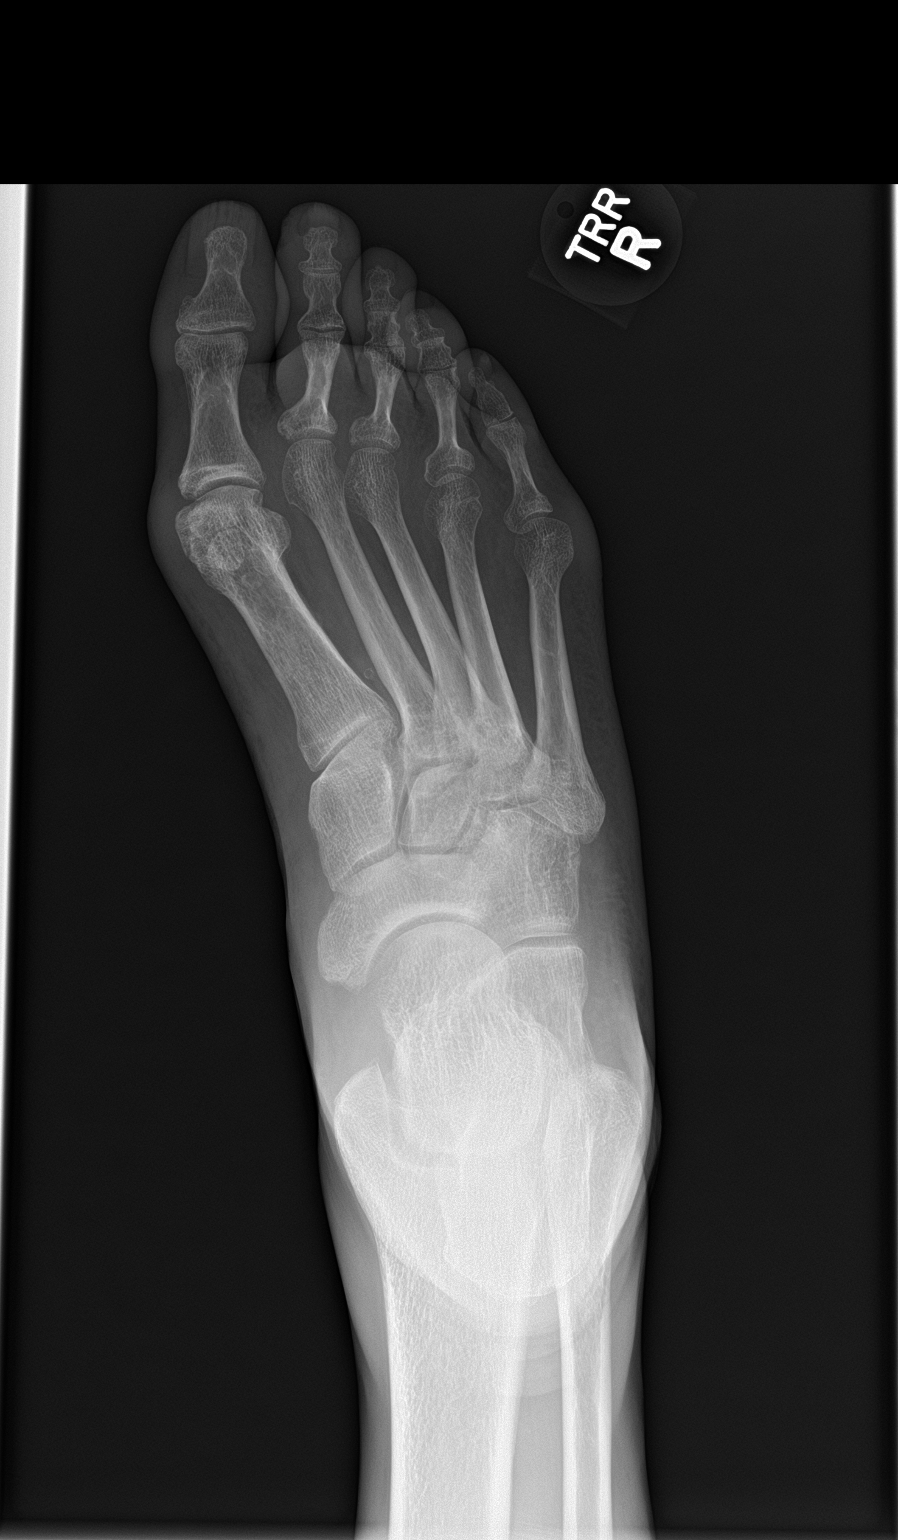

[foot obl]
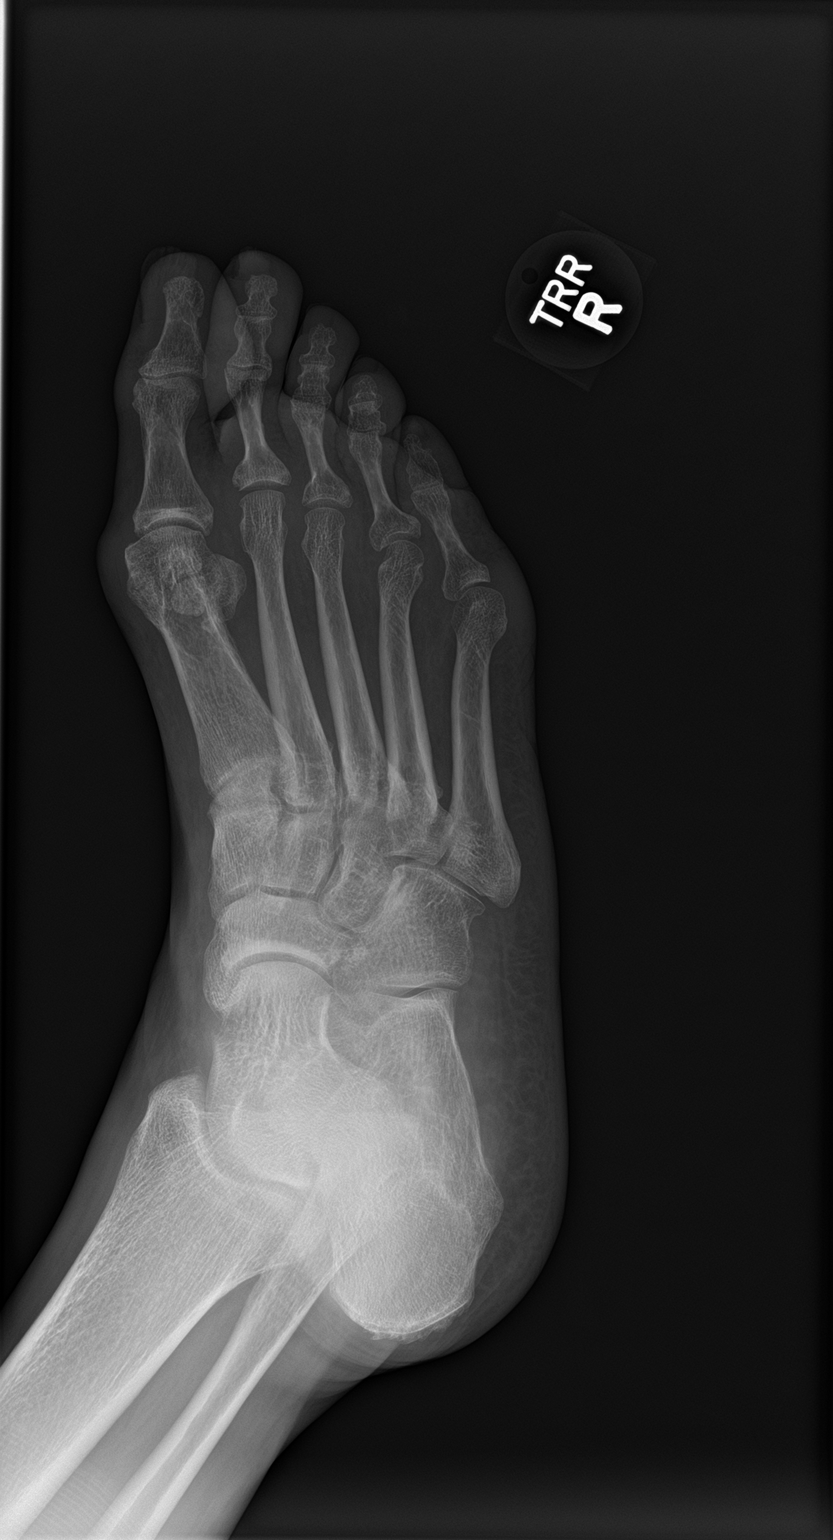

[foot lat]
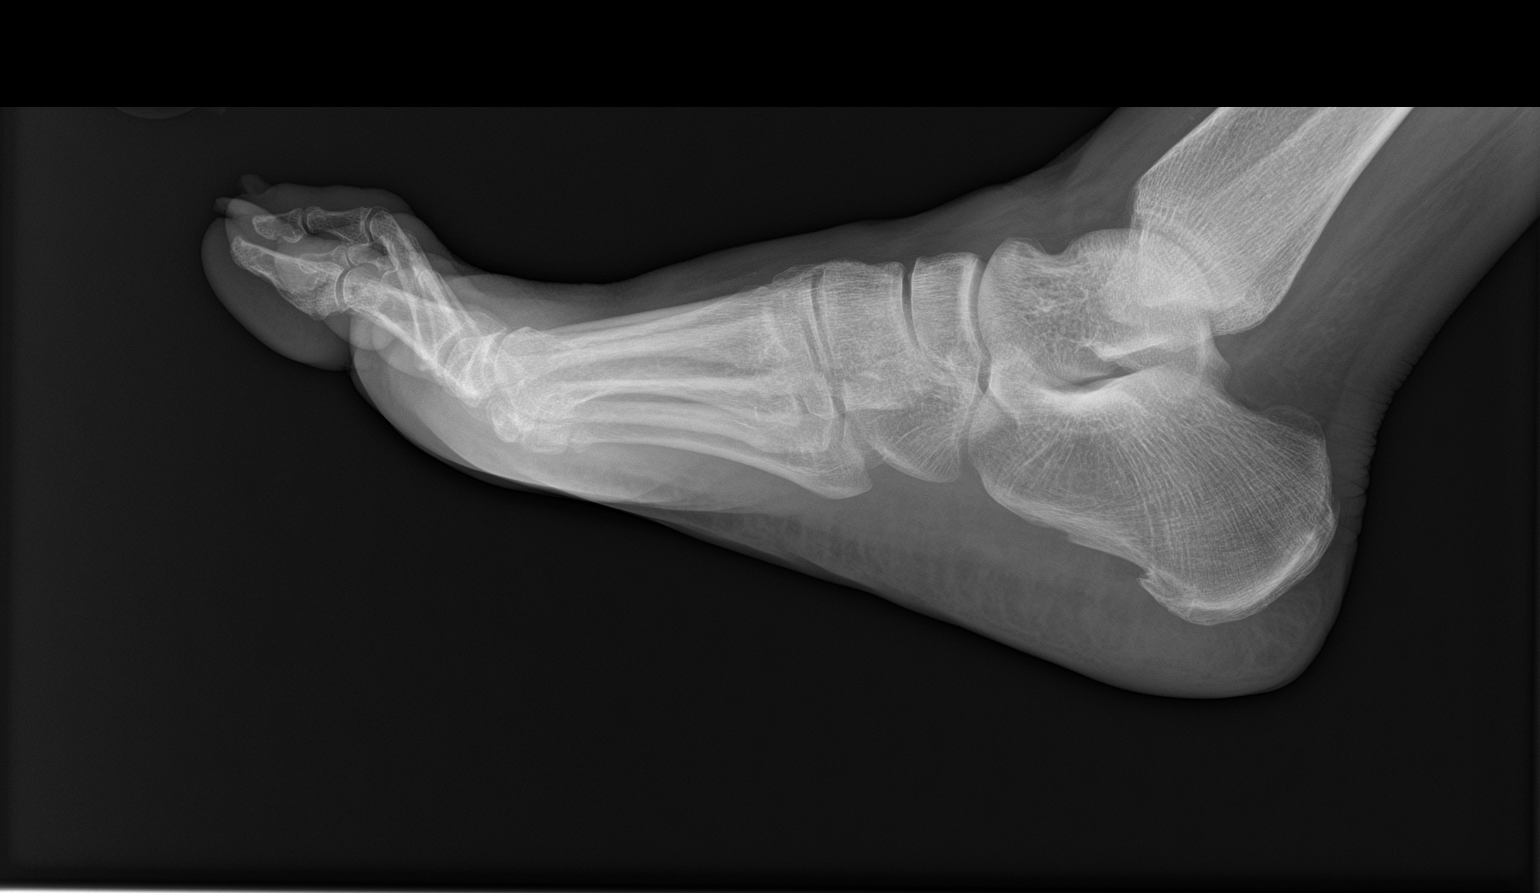

[3 of 3 positions shown; findings below may reference images not displayed]

FINDINGS: Left foot: There are stable postsurgical changes within the 1st
metatarsal head with underlying K-wires. Stable chronic
fragmentation of the 1st and 3rd metatarsal heads. No evidence of
acute fracture, dislocation or bone destruction. The soft tissues
appear unchanged, without unexpected foreign body or soft tissue
emphysema.

Right foot: The mineralization is normal. No evidence of acute
fracture or dislocation. There are mild degenerative changes of the
1st metatarsophalangeal joint with a mild hallux valgus deformity.
The tibial sesamoid of the 1st metatarsal is bipartite. No foreign
body or soft tissue emphysema identified.
IMPRESSION: No acute findings or evidence of osteomyelitis in either foot.
Stable postsurgical changes in the left foot.

## 2023-11-30 IMAGING — DX DG FOOT COMPLETE 3+V*L*
3 series · 3 of 3 positions shown · non-contrast
Comparison: Left foot radiographs 05/14/2021 and MRI 05/21/2021.

CLINICAL DATA: Bilateral foot pain for 1 month with infection,
worse on the left. No known injury. History of diabetes.

EXAM:
RIGHT FOOT COMPLETE - 3+ VIEW; LEFT FOOT - COMPLETE 3+ VIEW

[foot ap]
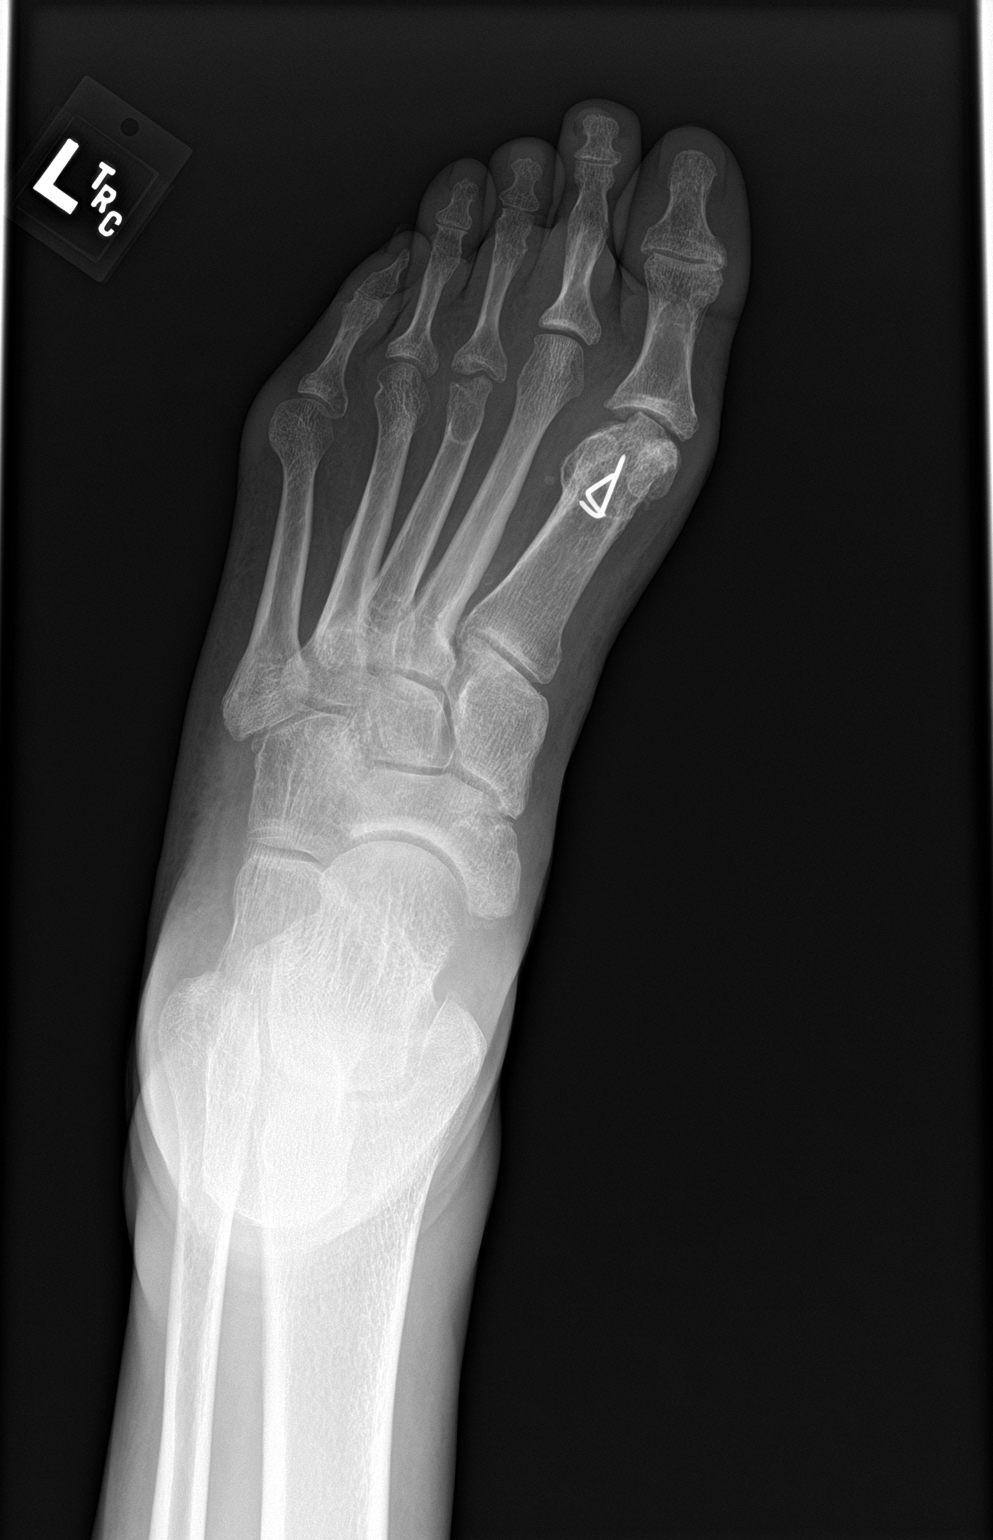

[foot obl]
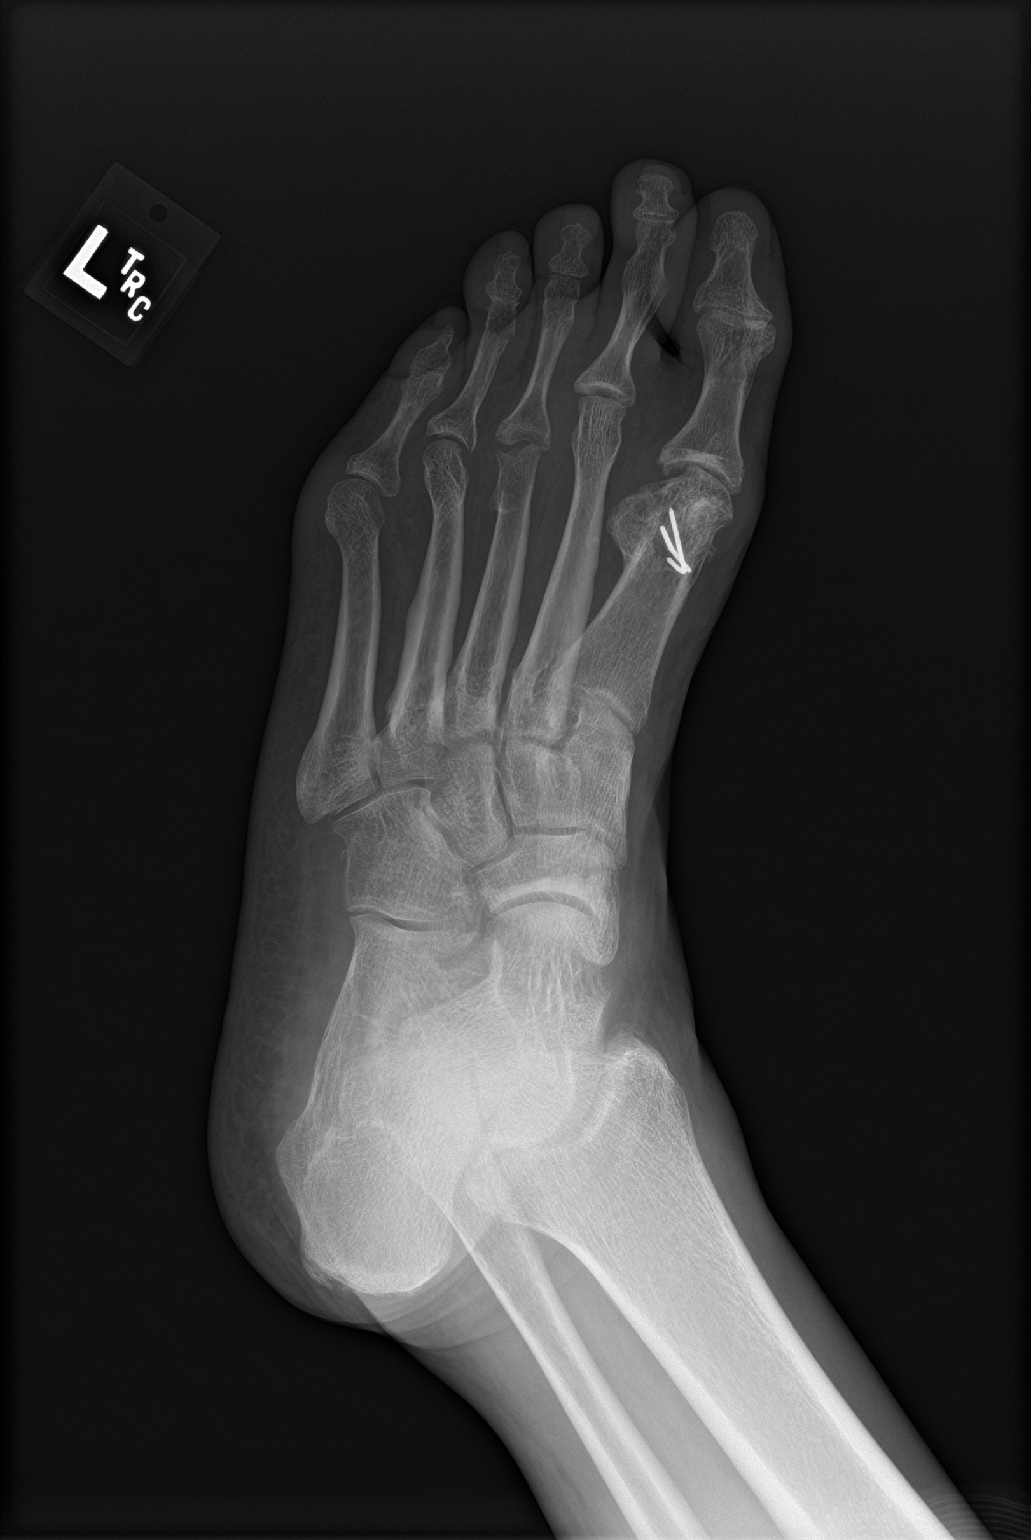

[foot lat]
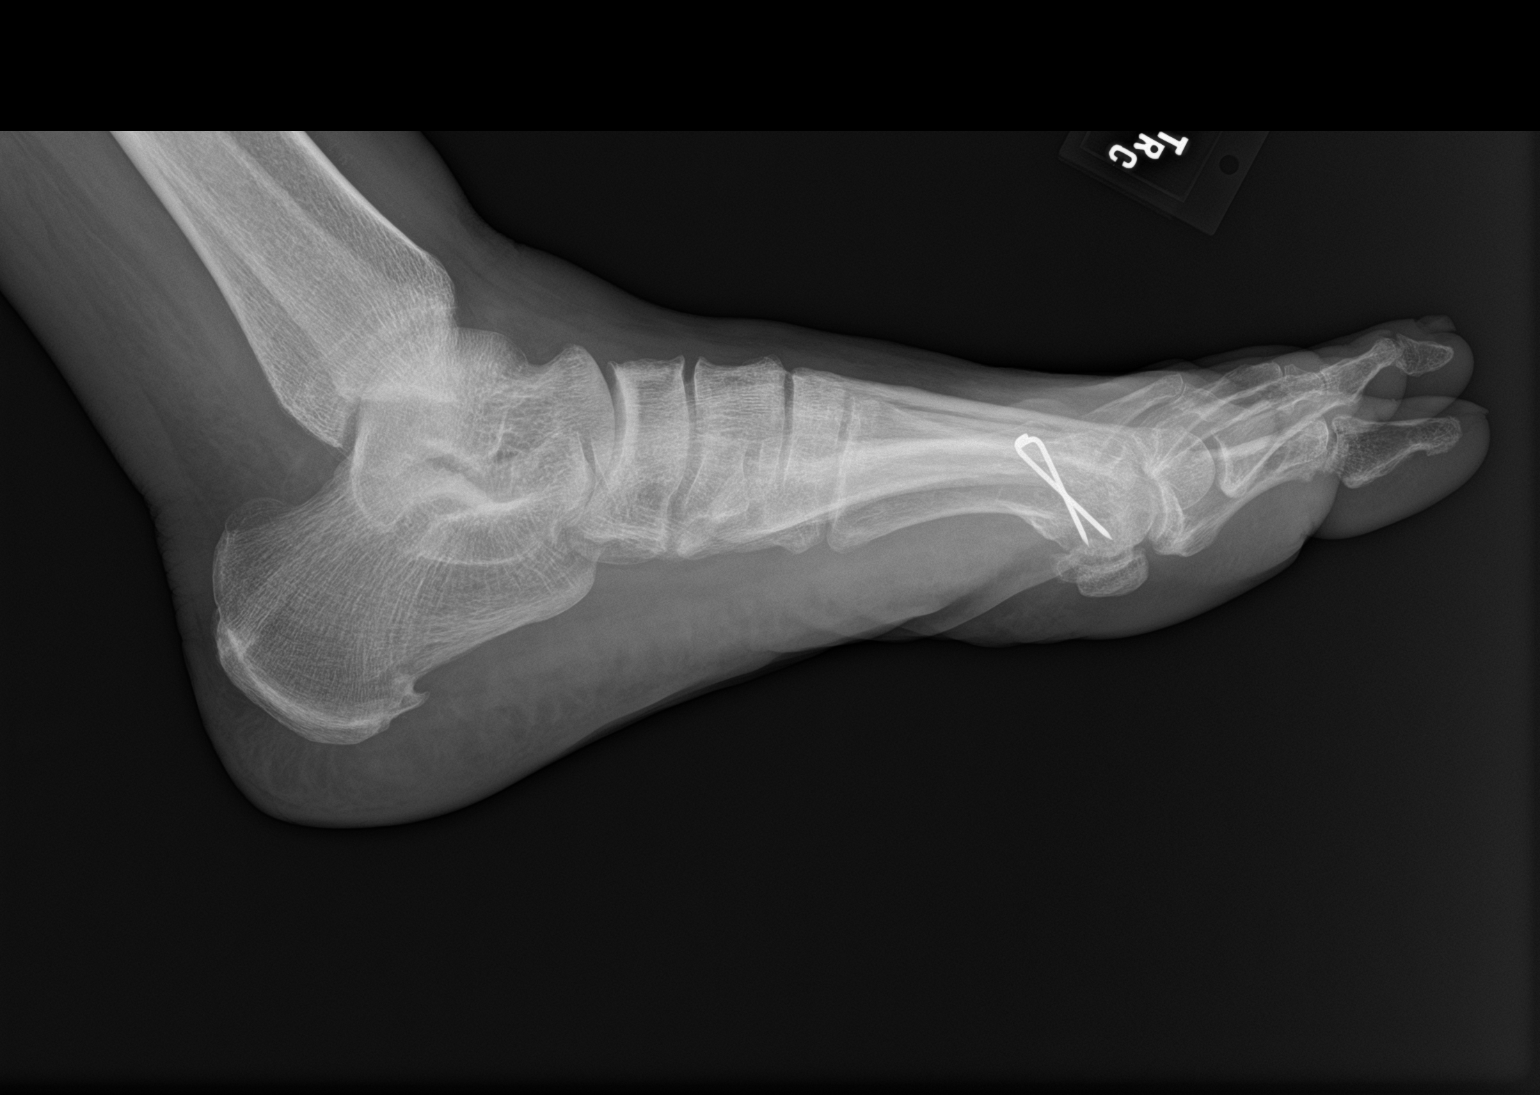

[3 of 3 positions shown; findings below may reference images not displayed]

FINDINGS: Left foot: There are stable postsurgical changes within the 1st
metatarsal head with underlying K-wires. Stable chronic
fragmentation of the 1st and 3rd metatarsal heads. No evidence of
acute fracture, dislocation or bone destruction. The soft tissues
appear unchanged, without unexpected foreign body or soft tissue
emphysema.

Right foot: The mineralization is normal. No evidence of acute
fracture or dislocation. There are mild degenerative changes of the
1st metatarsophalangeal joint with a mild hallux valgus deformity.
The tibial sesamoid of the 1st metatarsal is bipartite. No foreign
body or soft tissue emphysema identified.
IMPRESSION: No acute findings or evidence of osteomyelitis in either foot.
Stable postsurgical changes in the left foot.

## 2023-12-11 ENCOUNTER — Telehealth: Payer: Self-pay | Admitting: Medical

## 2023-12-11 ENCOUNTER — Other Ambulatory Visit: Payer: Self-pay | Admitting: Medical

## 2023-12-11 NOTE — Telephone Encounter (Signed)
 Copied from CRM #[id number]. Topic: Clinical - Medication Refill >> Dec 11, 2023 12:09 PM Macario HERO wrote: Medication: Methocarbamol  750 MG (Removed from chart)  Has the patient contacted their pharmacy? Yes, no more refills (Agent: If no, request that the patient contact the pharmacy for the refill. If patient does not wish to contact the pharmacy document the reason why and proceed with request.) (Agent: If yes, when and what did the pharmacy advise?)  This is the patient's preferred pharmacy:  [facility] - [place] ([place]), [place] - [address] [place] ([place]) [place] [address] Phone: [phone] Fax: [phone]   Is this the correct pharmacy for this prescription? Yes If no, delete pharmacy and type the correct one.   Has the prescription been filled recently? Yes  Is the patient out of the medication? Yes  Has the patient been seen for an appointment in the last year OR does the patient have an upcoming appointment? Yes  Can we respond through MyChart? No  Agent: Please be advised that Rx refills may take up to 3 business days. We ask that you follow-up with your pharmacy.

## 2023-12-11 NOTE — Telephone Encounter (Signed)
 Requesting methocarbamol , no longer on profile

## 2023-12-11 NOTE — Telephone Encounter (Unsigned)
 Copied from CRM [id number]. Topic: Clinical - Medication Refill >> Dec 11, 2023 12:18 PM Brittany M wrote: Medication: amLODipine  (NORVASC ) 5 MG tablet  Has the patient contacted their pharmacy? Yes (Agent: If no, request that the patient contact the pharmacy for the refill. If patient does not wish to contact the pharmacy document the reason why and proceed with request.) (Agent: If yes, when and what did the pharmacy advise?)  This is the patient's preferred pharmacy:  [facility] - [place]), [place] - [address] [place] ([place]) [place] [address] Phone: [phone] Fax: [phone]   Is this the correct pharmacy for this prescription? Yes If no, delete pharmacy and type the correct one.   Has the prescription been filled recently? Yes  Is the patient out of the medication? Yes  Has the patient been seen for an appointment in the last year OR does the patient have an upcoming appointment? Yes  Can we respond through MyChart? Yes  Agent: Please be advised that Rx refills may take up to 3 business days. We ask that you follow-up with your pharmacy.

## 2023-12-14 ENCOUNTER — Encounter: Payer: Self-pay | Admitting: Radiology

## 2023-12-28 ENCOUNTER — Ambulatory Visit: Admitting: Emergency Medicine

## 2024-02-01 ENCOUNTER — Other Ambulatory Visit: Payer: Self-pay | Admitting: Medical

## 2024-02-01 DIAGNOSIS — K529 Noninfective gastroenteritis and colitis, unspecified: Secondary | ICD-10-CM

## 2024-02-01 DIAGNOSIS — G8929 Other chronic pain: Secondary | ICD-10-CM

## 2024-02-01 NOTE — Telephone Encounter (Signed)
 Copied from CRM #[id number]. Topic: Clinical - Medication Refill >> Feb 01, 2024  4:43 PM [name] wrote: Medication: Baclofen  and oxycodone   Has the patient contacted their pharmacy? Yes They told her to contact the office.  This is the patient's preferred pharmacy:    [facility] - [place], [place] Address: [address] [place], [place] [address] Phone: [phone]  Is this the correct pharmacy for this prescription? Yes If no, delete pharmacy and type the correct one.   Has the prescription been filled recently? Yes  Is the patient out of the medication? Yes  Has the patient been seen for an appointment in the last year OR does the patient have an upcoming appointment? No  Can we respond through MyChart? No  Agent: Please be advised that Rx refills may take up to 3 business days. We ask that you follow-up with your pharmacy.

## 2024-02-02 NOTE — Telephone Encounter (Signed)
 Requesting: oxycodone   Contract:11/12/22 UDS: 11/12/22 Last Visit: 09/29/23 Next Visit: None Last Refill: 08/25/23 #90 and 0RF   Please Advise

## 2024-02-05 MED ORDER — BACLOFEN 10 MG PO TABS: 10.0000 mg | ORAL_TABLET | Freq: Three times a day (TID) | ORAL | 0 refills | Status: DC

## 2024-02-12 ENCOUNTER — Other Ambulatory Visit: Payer: Self-pay | Admitting: Medical

## 2024-02-18 ENCOUNTER — Telehealth: Payer: Self-pay

## 2024-02-18 ENCOUNTER — Other Ambulatory Visit (HOSPITAL_COMMUNITY): Payer: Self-pay

## 2024-02-18 NOTE — Telephone Encounter (Signed)
 Received fax from cover my meds stating they needed a PA for gabapentin  300 mg. But looked in pts chart and noticed she was rxd 600 mg. Called pt to see if she had any issues picking up her rx but received no answer vm detailed vm left for pt to call back.

## 2024-02-23 ENCOUNTER — Other Ambulatory Visit: Payer: Self-pay | Admitting: Medical

## 2024-02-23 DIAGNOSIS — K529 Noninfective gastroenteritis and colitis, unspecified: Secondary | ICD-10-CM

## 2024-02-23 NOTE — Telephone Encounter (Signed)
 Requesting: oxycodone   Contract:11/12/22 UDS: 11/12/22 Last Visit: 09/29/23 Next Visit: None Last Refill: 08/25/23 #90 and 0RF   Please Advise

## 2024-02-23 NOTE — Telephone Encounter (Unsigned)
 Copied from CRM [id number]. Topic: Clinical - Medication Refill >> Feb 23, 2024 11:35 AM Suzen RAMAN wrote: Medication: Oxycodone  HCl 10 MG TABS  Has the patient contacted their pharmacy? Yes   This is the patient's preferred pharmacy:  [facility] [address], [place] - [address] [place] [address] Phone: [phone] Fax: [phone]  Is this the correct pharmacy for this prescription? Yes If no, delete pharmacy and type the correct one.   Has the prescription been filled recently? No  Is the patient out of the medication? Yes  Has the patient been seen for an appointment in the last year OR does the patient have an upcoming appointment? Yes  Can we respond through MyChart? Yes  Agent: Please be advised that Rx refills may take up to 3 business days. We ask that you follow-up with your pharmacy.

## 2024-03-02 ENCOUNTER — Emergency Department (HOSPITAL_COMMUNITY): Payer: Self-pay

## 2024-03-02 ENCOUNTER — Observation Stay (HOSPITAL_COMMUNITY)
Admission: EM | Admit: 2024-03-02 | Discharge: 2024-03-03 | Disposition: A | Discharge status: Home / Self Care | Payer: Self-pay | Attending: Internal Medicine | Admitting: Internal Medicine

## 2024-03-02 DIAGNOSIS — Z79899 Other long term (current) drug therapy: Secondary | ICD-10-CM | POA: Insufficient documentation

## 2024-03-02 DIAGNOSIS — E1165 Type 2 diabetes mellitus with hyperglycemia: Secondary | ICD-10-CM | POA: Diagnosis present

## 2024-03-02 DIAGNOSIS — Z91040 Latex allergy status: Secondary | ICD-10-CM | POA: Insufficient documentation

## 2024-03-02 DIAGNOSIS — Z794 Long term (current) use of insulin: Secondary | ICD-10-CM | POA: Insufficient documentation

## 2024-03-02 DIAGNOSIS — Z8541 Personal history of malignant neoplasm of cervix uteri: Secondary | ICD-10-CM | POA: Insufficient documentation

## 2024-03-02 DIAGNOSIS — Z87891 Personal history of nicotine dependence: Secondary | ICD-10-CM | POA: Diagnosis not present

## 2024-03-02 DIAGNOSIS — K219 Gastro-esophageal reflux disease without esophagitis: Secondary | ICD-10-CM | POA: Diagnosis not present

## 2024-03-02 DIAGNOSIS — E1110 Type 2 diabetes mellitus with ketoacidosis without coma: Secondary | ICD-10-CM | POA: Diagnosis not present

## 2024-03-02 DIAGNOSIS — I10 Essential (primary) hypertension: Secondary | ICD-10-CM | POA: Diagnosis present

## 2024-03-02 DIAGNOSIS — E785 Hyperlipidemia, unspecified: Secondary | ICD-10-CM | POA: Insufficient documentation

## 2024-03-02 DIAGNOSIS — I130 Hypertensive heart and chronic kidney disease with heart failure and stage 1 through stage 4 chronic kidney disease, or unspecified chronic kidney disease: Secondary | ICD-10-CM | POA: Insufficient documentation

## 2024-03-02 DIAGNOSIS — M545 Low back pain, unspecified: Secondary | ICD-10-CM

## 2024-03-02 DIAGNOSIS — N1831 Chronic kidney disease, stage 3a: Secondary | ICD-10-CM | POA: Insufficient documentation

## 2024-03-02 DIAGNOSIS — I5032 Chronic diastolic (congestive) heart failure: Secondary | ICD-10-CM | POA: Diagnosis not present

## 2024-03-02 DIAGNOSIS — I2510 Atherosclerotic heart disease of native coronary artery without angina pectoris: Secondary | ICD-10-CM | POA: Insufficient documentation

## 2024-03-02 DIAGNOSIS — R110 Nausea: Secondary | ICD-10-CM | POA: Diagnosis present

## 2024-03-02 DIAGNOSIS — Z7984 Long term (current) use of oral hypoglycemic drugs: Secondary | ICD-10-CM | POA: Diagnosis not present

## 2024-03-02 DIAGNOSIS — J45909 Unspecified asthma, uncomplicated: Secondary | ICD-10-CM | POA: Diagnosis present

## 2024-03-02 DIAGNOSIS — J45901 Unspecified asthma with (acute) exacerbation: Secondary | ICD-10-CM | POA: Diagnosis not present

## 2024-03-02 DIAGNOSIS — E1122 Type 2 diabetes mellitus with diabetic chronic kidney disease: Secondary | ICD-10-CM | POA: Insufficient documentation

## 2024-03-02 LAB — COMPREHENSIVE METABOLIC PANEL WITH GFR
ALT: 14 U/L (ref 0–44)
AST: 21 U/L (ref 15–41)
Albumin: 4.1 g/dL (ref 3.5–5.0)
Alkaline Phosphatase: 145 U/L — ABNORMAL HIGH (ref 38–126)
Anion gap: 20 — ABNORMAL HIGH (ref 5–15)
BUN: 39 mg/dL — ABNORMAL HIGH (ref 6–20)
CO2: 23 mmol/L (ref 22–32)
Calcium: 10.2 mg/dL (ref 8.9–10.3)
Chloride: 88 mmol/L — ABNORMAL LOW (ref 98–111)
Creatinine, Ser: 1.85 mg/dL — ABNORMAL HIGH (ref 0.44–1.00)
GFR, Estimated: 31 mL/min — ABNORMAL LOW
Glucose, Bld: 606 mg/dL (ref 70–99)
Potassium: 3.9 mmol/L (ref 3.5–5.1)
Sodium: 131 mmol/L — ABNORMAL LOW (ref 135–145)
Total Bilirubin: 0.7 mg/dL (ref 0.0–1.2)
Total Protein: 9 g/dL — ABNORMAL HIGH (ref 6.5–8.1)

## 2024-03-02 LAB — URINALYSIS, ROUTINE W REFLEX MICROSCOPIC
Bilirubin Urine: NEGATIVE
Glucose, UA: >=500 mg/dL — AB
Ketones, ur: 20 mg/dL — AB
Nitrite: POSITIVE — AB
Protein, ur: >=300 mg/dL — AB
Specific Gravity, Urine: 1.024 (ref 1.005–1.030)
pH: 5 (ref 5.0–8.0)

## 2024-03-02 LAB — BASIC METABOLIC PANEL WITH GFR
Anion gap: 14 (ref 5–15)
Anion gap: 7 (ref 5–15)
Anion gap: 8 (ref 5–15)
Anion gap: 8 (ref 5–15)
BUN: 37 mg/dL — ABNORMAL HIGH (ref 6–20)
BUN: 30 mg/dL — ABNORMAL HIGH (ref 6–20)
BUN: 23 mg/dL — ABNORMAL HIGH (ref 6–20)
BUN: 32 mg/dL — ABNORMAL HIGH (ref 6–20)
CO2: 27 mmol/L (ref 22–32)
CO2: 26 mmol/L (ref 22–32)
CO2: 20 mmol/L — ABNORMAL LOW (ref 22–32)
CO2: 31 mmol/L (ref 22–32)
Calcium: 9.2 mg/dL (ref 8.9–10.3)
Calcium: 8.3 mg/dL — ABNORMAL LOW (ref 8.9–10.3)
Calcium: 5.6 mg/dL — CL (ref 8.9–10.3)
Calcium: 9.4 mg/dL (ref 8.9–10.3)
Chloride: 99 mmol/L (ref 98–111)
Chloride: 101 mmol/L (ref 98–111)
Chloride: 116 mmol/L — ABNORMAL HIGH (ref 98–111)
Chloride: 100 mmol/L (ref 98–111)
Creatinine, Ser: 1.65 mg/dL — ABNORMAL HIGH (ref 0.44–1.00)
Creatinine, Ser: 1.33 mg/dL — ABNORMAL HIGH (ref 0.44–1.00)
Creatinine, Ser: 0.93 mg/dL (ref 0.44–1.00)
Creatinine, Ser: 1.5 mg/dL — ABNORMAL HIGH (ref 0.44–1.00)
GFR, Estimated: 35 mL/min — ABNORMAL LOW
GFR, Estimated: 46 mL/min — ABNORMAL LOW
GFR, Estimated: >60 mL/min
GFR, Estimated: 40 mL/min — ABNORMAL LOW
Glucose, Bld: 146 mg/dL — ABNORMAL HIGH (ref 70–99)
Glucose, Bld: 293 mg/dL — ABNORMAL HIGH (ref 70–99)
Glucose, Bld: 250 mg/dL — ABNORMAL HIGH (ref 70–99)
Glucose, Bld: 106 mg/dL — ABNORMAL HIGH (ref 70–99)
Potassium: 3.4 mmol/L — ABNORMAL LOW (ref 3.5–5.1)
Potassium: 7.1 mmol/L (ref 3.5–5.1)
Potassium: 2.8 mmol/L — ABNORMAL LOW (ref 3.5–5.1)
Potassium: 3.5 mmol/L (ref 3.5–5.1)
Sodium: 139 mmol/L (ref 135–145)
Sodium: 134 mmol/L — ABNORMAL LOW (ref 135–145)
Sodium: 144 mmol/L (ref 135–145)
Sodium: 138 mmol/L (ref 135–145)

## 2024-03-02 LAB — CBG MONITORING, ED
Glucose-Capillary: 102 mg/dL — ABNORMAL HIGH (ref 70–99)
Glucose-Capillary: 115 mg/dL — ABNORMAL HIGH (ref 70–99)
Glucose-Capillary: 144 mg/dL — ABNORMAL HIGH (ref 70–99)
Glucose-Capillary: 146 mg/dL — ABNORMAL HIGH (ref 70–99)
Glucose-Capillary: 167 mg/dL — ABNORMAL HIGH (ref 70–99)
Glucose-Capillary: 171 mg/dL — ABNORMAL HIGH (ref 70–99)
Glucose-Capillary: 178 mg/dL — ABNORMAL HIGH (ref 70–99)
Glucose-Capillary: 181 mg/dL — ABNORMAL HIGH (ref 70–99)
Glucose-Capillary: 198 mg/dL — ABNORMAL HIGH (ref 70–99)
Glucose-Capillary: 223 mg/dL — ABNORMAL HIGH (ref 70–99)
Glucose-Capillary: 287 mg/dL — ABNORMAL HIGH (ref 70–99)
Glucose-Capillary: 343 mg/dL — ABNORMAL HIGH (ref 70–99)
Glucose-Capillary: 445 mg/dL — ABNORMAL HIGH (ref 70–99)
Glucose-Capillary: 484 mg/dL — ABNORMAL HIGH (ref 70–99)
Glucose-Capillary: 538 mg/dL (ref 70–99)

## 2024-03-02 LAB — CBC
HCT: 50.9 % — ABNORMAL HIGH (ref 36.0–46.0)
Hemoglobin: 17.4 g/dL — ABNORMAL HIGH (ref 12.0–15.0)
MCH: 29.3 pg (ref 26.0–34.0)
MCHC: 34.2 g/dL (ref 30.0–36.0)
MCV: 85.8 fL (ref 80.0–100.0)
Platelets: 372 K/uL (ref 150–400)
RBC: 5.93 MIL/uL — ABNORMAL HIGH (ref 3.87–5.11)
RDW: 14.2 % (ref 11.5–15.5)
WBC: 10.1 K/uL (ref 4.0–10.5)
nRBC: 0 % (ref 0.0–0.2)

## 2024-03-02 LAB — I-STAT CHEM 8, ED
BUN: 42 mg/dL — ABNORMAL HIGH (ref 6–20)
Calcium, Ion: 1.05 mmol/L — ABNORMAL LOW (ref 1.15–1.40)
Chloride: 93 mmol/L — ABNORMAL LOW (ref 98–111)
Creatinine, Ser: 1.7 mg/dL — ABNORMAL HIGH (ref 0.44–1.00)
Glucose, Bld: 523 mg/dL (ref 70–99)
HCT: 54 % — ABNORMAL HIGH (ref 36.0–46.0)
Hemoglobin: 18.4 g/dL — ABNORMAL HIGH (ref 12.0–15.0)
Potassium: 3.8 mmol/L (ref 3.5–5.1)
Sodium: 132 mmol/L — ABNORMAL LOW (ref 135–145)
TCO2: 24 mmol/L (ref 22–32)

## 2024-03-02 LAB — LIPASE, BLOOD: Lipase: 412 U/L — ABNORMAL HIGH (ref 11–51)

## 2024-03-02 LAB — PHOSPHORUS: Phosphorus: 2.5 mg/dL (ref 2.5–4.6)

## 2024-03-02 LAB — HEPATIC FUNCTION PANEL
ALT: 14 U/L (ref 0–44)
AST: 23 U/L (ref 15–41)
Albumin: 3.6 g/dL (ref 3.5–5.0)
Alkaline Phosphatase: 117 U/L (ref 38–126)
Bilirubin, Direct: 0.1 mg/dL (ref 0.0–0.2)
Indirect Bilirubin: 0.3 mg/dL (ref 0.3–0.9)
Total Bilirubin: 0.4 mg/dL (ref 0.0–1.2)
Total Protein: 7.7 g/dL (ref 6.5–8.1)

## 2024-03-02 LAB — BLOOD GAS, VENOUS
Acid-Base Excess: 6.6 mmol/L — ABNORMAL HIGH (ref 0.0–2.0)
Bicarbonate: 27.8 mmol/L (ref 20.0–28.0)
O2 Saturation: 33.6 %
Patient temperature: 37
pCO2, Ven: 29 mmHg — ABNORMAL LOW (ref 44–60)
pH, Ven: 7.59 — ABNORMAL HIGH (ref 7.25–7.43)
pO2, Ven: <31 mmHg — CL (ref 32–45)

## 2024-03-02 LAB — MAGNESIUM: Magnesium: 2.2 mg/dL (ref 1.7–2.4)

## 2024-03-02 LAB — D-DIMER, QUANTITATIVE: D-Dimer, Quant: 1.4 ug{FEU}/mL — ABNORMAL HIGH (ref 0.00–0.50)

## 2024-03-02 LAB — BETA-HYDROXYBUTYRIC ACID
Beta-Hydroxybutyric Acid: 0.09 mmol/L (ref 0.05–0.27)
Beta-Hydroxybutyric Acid: 0.06 mmol/L (ref 0.05–0.27)

## 2024-03-02 MED ORDER — ONDANSETRON HCL 4 MG/2ML IJ SOLN
4.0000 mg | Freq: Once | INTRAMUSCULAR | Status: DC
  Filled 2024-03-02: qty 2

## 2024-03-02 MED ORDER — APIXABAN 5 MG PO TABS
5.0000 mg | ORAL_TABLET | Freq: Two times a day (BID) | ORAL | Status: DC
  Administered 2024-03-02 – 2024-03-03 (×3): 5 mg via ORAL
  Filled 2024-03-02: qty 2
  Filled 2024-03-02 (×2): qty 1

## 2024-03-02 MED ORDER — CALCIUM GLUCONATE-NACL 1-0.675 GM/50ML-% IV SOLN
1.0000 g | Freq: Once | INTRAVENOUS | Status: AC
  Administered 2024-03-02: 1000 mg via INTRAVENOUS
  Filled 2024-03-02: qty 50

## 2024-03-02 MED ORDER — SODIUM CHLORIDE 0.9 % IV SOLN
1.0000 g | Freq: Once | INTRAVENOUS | Status: AC
  Administered 2024-03-03: 1 g via INTRAVENOUS
  Filled 2024-03-02: qty 10

## 2024-03-02 MED ORDER — SODIUM CHLORIDE 0.9 % IV BOLUS
1000.0000 mL | Freq: Once | INTRAVENOUS | Status: AC
  Administered 2024-03-02: 1000 mL via INTRAVENOUS

## 2024-03-02 MED ORDER — HYDROMORPHONE HCL 1 MG/ML IJ SOLN
0.7500 mg | INTRAMUSCULAR | Status: AC | PRN
  Administered 2024-03-02 – 2024-03-03 (×3): 0.75 mg via INTRAVENOUS
  Filled 2024-03-02 (×3): qty 1

## 2024-03-02 MED ORDER — ACETAMINOPHEN 325 MG PO TABS
650.0000 mg | ORAL_TABLET | Freq: Four times a day (QID) | ORAL | Status: DC | PRN
  Administered 2024-03-02 – 2024-03-03 (×2): 650 mg via ORAL
  Filled 2024-03-02 (×2): qty 2

## 2024-03-02 MED ORDER — LACTATED RINGERS IV SOLN: INTRAVENOUS | Status: DC

## 2024-03-02 MED ORDER — POTASSIUM CHLORIDE 10 MEQ/100ML IV SOLN
10.0000 meq | INTRAVENOUS | Status: DC
  Administered 2024-03-02 (×4): 10 meq via INTRAVENOUS
  Filled 2024-03-02 (×4): qty 100

## 2024-03-02 MED ORDER — GABAPENTIN 300 MG PO CAPS
600.0000 mg | ORAL_CAPSULE | Freq: Three times a day (TID) | ORAL | Status: DC
  Administered 2024-03-02 – 2024-03-03 (×2): 600 mg via ORAL
  Filled 2024-03-02 (×2): qty 2

## 2024-03-02 MED ORDER — HYDROMORPHONE HCL 1 MG/ML IJ SOLN
0.7500 mg | Freq: Once | INTRAMUSCULAR | Status: AC
  Administered 2024-03-02: 0.75 mg via INTRAVENOUS
  Filled 2024-03-02: qty 1

## 2024-03-02 MED ORDER — ALBUTEROL SULFATE HFA 108 (90 BASE) MCG/ACT IN AERS: 2.0000 | INHALATION_SPRAY | Freq: Four times a day (QID) | RESPIRATORY_TRACT | Status: DC | PRN

## 2024-03-02 MED ORDER — LACTATED RINGERS IV BOLUS: 1000.0000 mL | Freq: Once | INTRAVENOUS | Status: DC

## 2024-03-02 MED ORDER — CARVEDILOL 12.5 MG PO TABS
25.0000 mg | ORAL_TABLET | Freq: Two times a day (BID) | ORAL | Status: DC
  Administered 2024-03-02 – 2024-03-03 (×2): 25 mg via ORAL
  Filled 2024-03-02 (×2): qty 2

## 2024-03-02 MED ORDER — ALBUTEROL SULFATE (2.5 MG/3ML) 0.083% IN NEBU
2.5000 mg | INHALATION_SOLUTION | Freq: Four times a day (QID) | RESPIRATORY_TRACT | Status: DC | PRN
  Administered 2024-03-02: 2.5 mg via RESPIRATORY_TRACT
  Filled 2024-03-02: qty 3

## 2024-03-02 MED ORDER — BACLOFEN 10 MG PO TABS: 10.0000 mg | ORAL_TABLET | Freq: Three times a day (TID) | ORAL | Status: DC | PRN

## 2024-03-02 MED ORDER — ONDANSETRON HCL 4 MG PO TABS: 4.0000 mg | ORAL_TABLET | Freq: Four times a day (QID) | ORAL | Status: DC | PRN

## 2024-03-02 MED ORDER — ONDANSETRON HCL 4 MG/2ML IJ SOLN: 4.0000 mg | Freq: Four times a day (QID) | INTRAMUSCULAR | Status: DC | PRN

## 2024-03-02 MED ORDER — METOCLOPRAMIDE HCL 5 MG/ML IJ SOLN
5.0000 mg | Freq: Once | INTRAMUSCULAR | Status: AC
  Administered 2024-03-02: 5 mg via INTRAVENOUS
  Filled 2024-03-02: qty 2

## 2024-03-02 MED ORDER — HYDROMORPHONE HCL 1 MG/ML IJ SOLN
0.5000 mg | Freq: Once | INTRAMUSCULAR | Status: AC
  Administered 2024-03-02: 0.5 mg via INTRAVENOUS
  Filled 2024-03-02: qty 1

## 2024-03-02 MED ORDER — ACETAMINOPHEN 650 MG RE SUPP: 650.0000 mg | Freq: Four times a day (QID) | RECTAL | Status: DC | PRN

## 2024-03-02 MED ORDER — DEXTROSE IN LACTATED RINGERS 5 % IV SOLN: INTRAVENOUS | Status: DC

## 2024-03-02 MED ORDER — METOCLOPRAMIDE HCL 5 MG/ML IJ SOLN
10.0000 mg | Freq: Three times a day (TID) | INTRAMUSCULAR | Status: DC
  Administered 2024-03-02 – 2024-03-03 (×2): 10 mg via INTRAVENOUS
  Filled 2024-03-02 (×3): qty 2

## 2024-03-02 MED ORDER — ONDANSETRON HCL 4 MG/2ML IJ SOLN: 4.0000 mg | Freq: Once | INTRAMUSCULAR | Status: DC

## 2024-03-02 MED ORDER — ATORVASTATIN CALCIUM 40 MG PO TABS
80.0000 mg | ORAL_TABLET | Freq: Every day | ORAL | Status: DC
  Administered 2024-03-02 – 2024-03-03 (×2): 80 mg via ORAL
  Filled 2024-03-02 (×2): qty 2

## 2024-03-02 MED ORDER — SODIUM CHLORIDE 0.9 % IV SOLN
2.0000 g | Freq: Once | INTRAVENOUS | Status: AC
  Administered 2024-03-02: 2 g via INTRAVENOUS
  Filled 2024-03-02: qty 20

## 2024-03-02 MED ORDER — ONDANSETRON HCL 4 MG/2ML IJ SOLN
4.0000 mg | Freq: Four times a day (QID) | INTRAMUSCULAR | Status: DC | PRN
  Administered 2024-03-03: 4 mg via INTRAVENOUS
  Filled 2024-03-02: qty 2

## 2024-03-02 MED ORDER — LACTATED RINGERS IV BOLUS
1000.0000 mL | INTRAVENOUS | Status: AC
  Administered 2024-03-02: 1000 mL via INTRAVENOUS

## 2024-03-02 MED ORDER — POTASSIUM CHLORIDE CRYS ER 20 MEQ PO TBCR
40.0000 meq | EXTENDED_RELEASE_TABLET | Freq: Once | ORAL | Status: AC
  Administered 2024-03-02: 40 meq via ORAL
  Filled 2024-03-02: qty 2

## 2024-03-02 MED ORDER — SODIUM CHLORIDE 0.9 % IV SOLN
12.5000 mg | Freq: Four times a day (QID) | INTRAVENOUS | Status: DC | PRN
  Administered 2024-03-02: 12.5 mg via INTRAVENOUS
  Filled 2024-03-02: qty 12.5

## 2024-03-02 MED ORDER — PANTOPRAZOLE SODIUM 40 MG PO TBEC
40.0000 mg | DELAYED_RELEASE_TABLET | Freq: Every day | ORAL | Status: DC
  Administered 2024-03-02 – 2024-03-03 (×2): 40 mg via ORAL
  Filled 2024-03-02 (×2): qty 1

## 2024-03-02 MED ORDER — PROMETHAZINE (PHENERGAN) 6.25MG IN NS 50ML IVPB
6.2500 mg | Freq: Once | INTRAVENOUS | Status: AC
  Administered 2024-03-02: 6.25 mg via INTRAVENOUS
  Filled 2024-03-02: qty 6.25

## 2024-03-02 MED ORDER — DEXTROSE 50 % IV SOLN: 0.0000 mL | INTRAVENOUS | Status: DC | PRN

## 2024-03-02 MED ORDER — AMLODIPINE BESYLATE 5 MG PO TABS
5.0000 mg | ORAL_TABLET | Freq: Every day | ORAL | Status: DC
  Administered 2024-03-02 – 2024-03-03 (×2): 5 mg via ORAL
  Filled 2024-03-02 (×2): qty 1

## 2024-03-02 MED ORDER — INSULIN GLARGINE-YFGN 100 UNIT/ML ~~LOC~~ SOLN
20.0000 [IU] | SUBCUTANEOUS | Status: DC
  Administered 2024-03-02: 20 [IU] via SUBCUTANEOUS
  Filled 2024-03-02 (×2): qty 0.2

## 2024-03-02 MED ORDER — INSULIN ASPART 100 UNIT/ML IJ SOLN
0.0000 [IU] | INTRAMUSCULAR | Status: DC
  Administered 2024-03-03: 5 [IU] via SUBCUTANEOUS
  Filled 2024-03-02: qty 5

## 2024-03-02 MED ORDER — INSULIN REGULAR(HUMAN) IN NACL 100-0.9 UT/100ML-% IV SOLN
INTRAVENOUS | Status: DC
  Administered 2024-03-02: 9.5 [IU]/h via INTRAVENOUS
  Filled 2024-03-02: qty 100

## 2024-03-02 NOTE — H&P (Signed)
 " History and Physical    Patient: [name] FMW:[id number] DOB: 10-07-1965 DOA: 03/02/2024 DOS: the patient was seen and examined on 03/02/2024 PCP: Dorina Loving, PA-C  Patient coming from: Home  Chief Complaint:  Chief Complaint  Patient presents with   Nausea   Emesis   HPI: EMMELINA Peck is a 59 y.o. female with medical history significant of abdominal pain, gastroparesis, history of pyelonephritis, history of respiratory failure with hypoxia, left ankle pain, anxiety, asthma, CAD, cardiomegaly, secondary cardiomyopathy, cellulitis of the left lower extremity.  History of cervical cancer, chronic back pain, chronic diastolic heart failure, stage III CKD, chronic pain syndrome, coffee-ground emesis, type 2 diabetes, hyperlipidemia, hypertension, hypomagnesemia, vitamin D deficiency who presented to the emergency department with complaints of urinary frequency, nausea and multiple episodes of emesis. Positive polyuria, polydipsia, and blurred vision.  She ran out of insulin  4 days ago.  She denied fever, chills, rhinorrhea, sore throat, wheezing or hemoptysis.  No chest pain, palpitations, diaphoresis, PND, orthopnea or pitting edema of the lower extremities.  No diarrhea, constipation, melena or hematochezia.  No flank pain, dysuria, frequency or hematuria.     Lab work: Urinalysis was cloudy with greater than 500 glucose, ketones of 20 and greater than 300 protein.  Positive nitrates, moderate hemoglobin and trace leukocyte esterase.  21-50 WBCs and many bacteria.  CBC showed a white count of 10.1, hemoglobin 17.4 g/dL and platelets 627.  CMP showed a total protein of 9.0 g/dL, alkaline phosphatase 854 units/L, glucose 606, BUN 39 and creatinine 1.85 mg/dL.  The rest of the LFTs and electrolytes were normal after sodium/chloride correction.  Lipase was 412 units/L.  Imaging: CT abdomen/pelvis showed borderline urinary bladder wall thickening consider UA to rule out UTI.  Lower  lumbar spondylosis and DDD.  Moderate aortoiliac atherosclerotic calcifications.  ED course: Initial vital signs were temperature 97.8 F, pulse 122, respiration 32, BP 155/111 mmHg and O2 sat 89% on room air.  The patient received ceftriaxone  2 g IVPB, hydromorphone  0.5 mg IVP, LR 1000 mL liter bolus, normal saline 1000 mL liter bolus, promethazine  6.25 mg IVP, metoclopramide  5 mg IVP and was started on an insulin  infusion.   Review of Systems: As mentioned in the history of present illness. All other systems reviewed and are negative. Past Medical History:  Diagnosis Date   Abdominal pain with vomiting    ABDOMINAL PAIN-EPIGASTRIC 09/14/2009   Qualifier: Diagnosis of   By: [name], [name]        Abnormal CT scan 11/05/2022   Abnormal Pap smear of cervix    Abscess of left lower leg    Acute cystitis 07/07/2017   Acute kidney injury superimposed on chronic kidney disease 04/22/2021   Acute pulmonary embolism (HCC) 01/30/2023   Acute pyelonephritis 08/02/2022   Acute renal failure superimposed on stage 3a chronic kidney disease (HCC) 11/04/2022   Acute respiratory failure with hypoxia (HCC) 01/31/2023   ANKLE PAIN, LEFT 12/25/2009   Qualifier: Diagnosis of   By: Adella MD, [name]         Anxiety 01/22/2016   Asthma    Asthma without acute exacerbation 01/31/2023   CAD (coronary artery disease) 07/07/2017   Cardiac murmur 02/01/2019   Cardiomegaly 01/31/2023   Cardiomyopathy, secondary (HCC) 09/06/2009   Qualifier: Diagnosis of  By: [name]   Formatting of this note might be different from the original. Overview:  Qualifier: Diagnosis of  By: [name]   Cardiomyopathy,  unspecified (HCC) 09/06/2009   Formatting of this note might be different from the original.  Overview:   Overview:   Qualifier: Diagnosis of   By: [name]     Overview:   Overview:   Qualifier: Diagnosis of   By: [name]   Formatting of this note might be different from the original.  Overview:   Qualifier: Diagnosis of   By: [name]     Cataract    Cellulitis of left leg 04/22/2021   Cerebrovascular disease 09/13/2012   Cervical cancer (HCC) 2001   Radical hysterectomy in [facility]   CHEST PAIN, ATYPICAL 07/30/2009   Qualifier: Diagnosis of   By: Adella MD, [name] of this note might be different from the original.  Overview:   Qualifier: Diagnosis of   By: Adella MD, [name]     Chest pain, unspecified 04/22/2021   Chronic back pain    Chronic diastolic HF (heart failure) (HCC) 05/21/2021   Chronic kidney disease, stage III (moderate) (HCC) 05/23/2014   Chronic pain syndrome 01/23/2016   Coffee ground emesis 11/05/2022   Coronary artery disease    30% lesions noted   Current use of insulin  (HCC) 05/23/2014   Dehydration 08/02/2022   Depression    Diabetes type 2, uncontrolled 05/23/2014   Diabetic gastroparesis associated with type 1 diabetes mellitus (HCC) 11/27/2016   DIABETIC PERIPHERAL NEUROPATHY 09/21/2009   Qualifier: Diagnosis of  By: Adella MD, [name]     Diabetic peripheral neuropathy (HCC) 01/23/2016   DM2 (diabetes mellitus, type 2) (HCC) 07/30/2009   Qualifier: Diagnosis of   By: Adella MD, [name]         Dyslipidemia 02/01/2019   Dysuria 12/25/2009   Qualifier: Diagnosis of   By: Adella MD, [name]         Essential hypertension 08/30/2009   Qualifier: Diagnosis of  By: Leana, CNA, Christy     Gastric atony 07/30/2009   Formatting of this note might be different from the original.  Overview:   Qualifier: Diagnosis of   By: Adella MD, [name]     Gastroesophageal reflux disease 07/30/2009   Formatting of this note might be different from the original. Overview:  Overview:  Qualifier: Diagnosis of  By: Adella MD, Almarie Dan: Diagnosis of  By: Kerman NP, Vina Deal of this note might be different  from the original. Overview:  Qualifier: Diagnosis of  By: Adella MD, Almarie Dan: Diagnosis of  By: Kerman NP, Vina   Gastroparesis 07/30/2009   Qualifier: Diagnosis of   By: Adella MD, [name]         GERD 07/30/2009   Qualifier: Diagnosis of   By: Adella MD, Almarie Dan: Diagnosis of   By: Kerman NP, Vina         GERD (gastroesophageal reflux disease)    High anion gap metabolic acidosis 08/02/2022   History of cervical cancer 08/17/2015   Status post partial hysterectomy  Formatting of this note might be different from the original. Status post partial hysterectomy   HLD (hyperlipidemia) 07/30/2009   Qualifier: Diagnosis of   By: Adella MD, [name]         Hyperlipidemia    Hyperlipidemia, unspecified 07/30/2009   Formatting of this note might be different from the original.  Overview:   Qualifier: Diagnosis of   By: Adella MD, Almarie  Formatting of this note might be different from the original.  Overview:   Qualifier: Diagnosis of   By: Adella MD, [name]     Hypertension    Hypertensive urgency 08/30/2009   Qualifier: Diagnosis of   By: Leana, CNA, Christy         Hypomagnesemia 01/31/2023   INSOMNIA 09/21/2009   Qualifier: Diagnosis of  By: Adella MD, [name]     Intractable nausea and vomiting 08/03/2022   Lumbar facet arthropathy 05/14/2016   Lumbar spinal stenosis 02/11/2018   Metatarsalgia of both feet 03/11/2017   Migraine 09/13/2012   Overview:  IMPRESSION: possible abd migraine   Mood disorder 01/05/2022   Nausea & vomiting 07/07/2017   Nausea with vomiting, unspecified 09/14/2009   Qualifier: Diagnosis of   By: Ever Riggers, [name]        Formatting of this note might be different from the original.  Overview:   Qualifier: Diagnosis of   By: Ever Riggers, [name]     Neuropathy 07/20/2017   Overview:   Diabetic     Formatting of this note might be different from the original.  Diabetic     Nonspecific abnormal  findings on radiological and examination of skull and head 09/13/2012   Normocytic anemia 01/31/2023   Osteomyelitis of second toe of left foot (HCC) 01/05/2022   Overweight (BMI 25.0-29.9) 07/21/2014   Pain of left calf 04/19/2021   PUD (peptic ulcer disease) 09/28/2009   Qualifier: Diagnosis of   By: Kerman NP, Paula         Pure hypercholesterolemia 11/27/2016   Retention of urine 06/25/2011   Sepsis due to cellulitis (HCC) 04/22/2021   Severe sepsis (HCC) 04/22/2021   SIRS (systemic inflammatory response syndrome) (HCC) 05/21/2021   Thrombocytosis 01/31/2023   TOBACCO ABUSE 09/21/2009   Qualifier: Diagnosis of  By: Adella MD, [name] of this note might be different from the original. Overview:  Qualifier: Diagnosis of  By: Adella MD, Almarie BUTTERY, SERUM, ELEVATED 09/21/2009   Qualifier: Diagnosis of   By: Adella MD, [name]         Trigeminal neuralgia    Tuberculin test reaction 07/30/2009   Formatting of this note might be different from the original.  Overview:   Annotation: CXR clear ?  diagnosed in 2/11?  On INH/pyridoxine  Qualifier: Diagnosis of   By: Adella MD, [name]     Type 2 diabetes mellitus with hyperglycemia (HCC) 07/30/2009   Qualifier: Diagnosis of   By: Adella MD, [name] of this note might be different from the original.  Overview:   Qualifier: Diagnosis of   By: Adella MD, [name]     Type 2 diabetes mellitus with stage 3 chronic kidney disease (HCC) 09/14/2009   Qualifier: Diagnosis of   By: Ever Riggers, [name]        Uncontrolled type 2 diabetes mellitus with hyperglycemia, without long-term current use of insulin  (HCC) 04/22/2021   UTI (urinary tract infection) 09/21/2009   Qualifier: Diagnosis of   By: Adella MD, Almarie OLSZEWSKI, CANDIDAL 12/25/2009   Qualifier: Diagnosis of  By: Adella MD, [name]     Viral upper respiratory tract infection 11/30/2020   Vitamin D  deficiency 04/25/2013   Past Surgical History:  Procedure Laterality Date   ABDOMINAL HYSTERECTOMY     radical hysterectomy   AMPUTATION Left 01/06/2022   Procedure: AMPUTATION 2nd  TOE;  Surgeon: Harden Jerona GAILS, MD;  Location: [facility] OR;  Service: Orthopedics;  Laterality: Left;   BIOPSY  11/06/2022   Procedure: BIOPSY;  Surgeon: Federico Rosario BROCKS, MD;  Location: [facility] ENDOSCOPY;  Service: Gastroenterology;;   CHOLECYSTECTOMY     ESOPHAGOGASTRODUODENOSCOPY (EGD) WITH PROPOFOL  N/A 11/06/2022   Procedure: ESOPHAGOGASTRODUODENOSCOPY (EGD) WITH PROPOFOL ;  Surgeon: Federico Rosario BROCKS, MD;  Location: [facility] ENDOSCOPY;  Service: Gastroenterology;  Laterality: N/A;   I & D EXTREMITY Left 04/24/2021   Procedure: LEFT LEG DEBRIDEMENT;  Surgeon: Harden Jerona GAILS, MD;  Location: [facility] OR;  Service: Orthopedics;  Laterality: Left;   I & D EXTREMITY Left 05/01/2021   Procedure: LEFT KNEE DEBRIDEMENT;  Surgeon: Harden Jerona GAILS, MD;  Location: [facility] OR;  Service: Orthopedics;  Laterality: Left;   Social History:  reports that she quit smoking about 36 years ago. Her smoking use included cigarettes. She has never used smokeless tobacco. She reports current alcohol use. She reports that she does not use drugs.  Allergies[1]  Family History  Problem Relation Age of Onset   Breast cancer Mother    Stroke Mother    Hypertension Mother    Diabetes Mother    Hypertension Father    Diabetes Father    Heart attack Father    Hypertension Sister    Diabetes Sister    Hypertension Brother    Diabetes Brother    Hypertension Brother    Diabetes Brother    Stroke Maternal Grandmother    Colon cancer Neg Hx    Colon polyps Neg Hx    Esophageal cancer Neg Hx    Stomach cancer Neg Hx    Rectal cancer Neg Hx     Prior to Admission medications  Medication Sig Start Date End Date Taking? Authorizing Provider  albuterol  (VENTOLIN  HFA) 108 (90 Base) MCG/ACT inhaler Inhale 2 puffs into the lungs every 6 (six) hours as needed for  wheezing or shortness of breath. 08/03/20   Saguier, [place], PA-C  amLODipine  (NORVASC ) 5 MG tablet Take 1 tablet by mouth once daily 12/11/23   Saguier, [place], PA-C  apixaban  (ELIQUIS ) 5 MG TABS tablet Take 1 tablet (5 mg total) by mouth 2 (two) times daily. 08/25/23   Saguier, [place], PA-C  atorvastatin  (LIPITOR ) 80 MG tablet Take 1 tablet (80 mg total) by mouth daily. 01/20/23   Saguier, [place], PA-C  baclofen  (LIORESAL ) 10 MG tablet Take 1 tablet (10 mg total) by mouth 3 (three) times daily. 02/05/24   Saguier, [place], PA-C  carvedilol  (COREG ) 25 MG tablet Take 1 tablet (25 mg total) by mouth 2 (two) times daily with a meal. 09/22/23   Saguier, [place], PA-C  fluticasone  furoate-vilanterol (BREO ELLIPTA ) 100-25 MCG/INH AEPB Inhale 1 puff into the lungs daily. Patient taking differently: Inhale 1 puff into the lungs daily as needed (Asthma). 08/03/20   Saguier, [place], PA-C  gabapentin  (NEURONTIN ) 300 MG capsule TAKE 2 CAPSULES BY MOUTH THREE TIMES DAILY 02/12/24   Saguier, [place], PA-C  Insulin  Glargine (BASAGLAR  KWIKPEN) 100 UNIT/ML INJECT 40 UNITS SUBCUTANEOUSLY AT BEDTIME 02/23/24   Saguier, [place], PA-C  meloxicam  (MOBIC ) 7.5 MG tablet Take 1 tablet (7.5 mg total) by mouth daily. 09/11/23   Yacopino, Jessica L, NP  metFORMIN  (GLUCOPHAGE -XR) 500 MG 24 hr tablet Take 1 tablet by mouth once daily with breakfast 09/21/23   Saguier, [place], PA-C  nitroGLYCERIN  (NITROSTAT ) 0.4 MG SL tablet Place 1 tablet (0.4 mg total) under the tongue every 5 (five) minutes as needed for  chest pain. 08/25/23   Saguier, [place], PA-C  Oxycodone  HCl 10 MG TABS Take 1 tablet (10 mg total) by mouth 3 (three) times daily as needed for severe pain 08/25/23   Saguier, Dallas, PA-C  OZEMPIC , 0.25 OR 0.5 MG/DOSE, 2 MG/3ML SOPN Inject 0.5 mg into the skin once a week. mondays 10/14/22   [provider]  pantoprazole  (PROTONIX ) 40 MG tablet Take 1 tablet (40 mg total) by mouth daily. 09/11/23   Yacopino, Jessica L, NP  promethazine   (PHENERGAN ) 12.5 MG tablet Take 1 tablet (12.5 mg total) by mouth at bedtime. 06/04/23   May, Deanna J, NP  sertraline  (ZOLOFT ) 25 MG tablet Take 1 tablet (25 mg total) by mouth daily. 08/25/23   Saguier, Dallas, PA-C  topiramate  (TOPAMAX ) 50 MG tablet Take 1 tablet (50 mg total) by mouth 2 (two) times daily. Patient taking differently: Take 50 mg by mouth 2 (two) times daily as needed (headache). 01/13/22   Saguier, Dallas, PA-C  VITAMIN D PO Take 5,000 Units by mouth daily.    [provider]    Physical Exam: Vitals:   03/02/24 0804 03/02/24 0815 03/02/24 1026 03/02/24 1245  BP: (!) 155/111  (!) 179/112 (!) 137/103  Pulse: (!) 122 (!) 109 98 (!) 118  Resp: (!) 32 (!) 22 20 16   Temp: 97.8 F (36.6 C)   98.9 F (37.2 C)  TempSrc: Oral   Oral  SpO2: (!) 89% 100% 97% 97%   Physical Exam Vitals and nursing note reviewed.  Constitutional:      General: She is awake. She is not in acute distress.    Appearance: Normal appearance. She is ill-appearing.  HENT:     Head: Normocephalic.     Nose: No rhinorrhea.     Mouth/Throat:     Mouth: Mucous membranes are dry.  Eyes:     General: No scleral icterus.    Pupils: Pupils are equal, round, and reactive to light.  Neck:     Vascular: No JVD.  Cardiovascular:     Rate and Rhythm: Regular rhythm. Tachycardia present.     Heart sounds: S1 normal and S2 normal.  Pulmonary:     Effort: Pulmonary effort is normal.     Breath sounds: Normal breath sounds. No wheezing, rhonchi or rales.  Abdominal:     General: Bowel sounds are normal. There is no distension.     Palpations: Abdomen is soft.     Tenderness: There is no abdominal tenderness. There is no right CVA tenderness or left CVA tenderness.  Musculoskeletal:     Cervical back: Neck supple.     Right lower leg: No edema.     Left lower leg: No edema.  Skin:    General: Skin is warm and dry.     Findings: No bruising, erythema or lesion.  Neurological:     General: No  focal deficit present.     Mental Status: She is alert and oriented to person, place, and time.  Psychiatric:        Mood and Affect: Mood normal.        Behavior: Behavior normal. Behavior is cooperative.     Data Reviewed:  Results are pending, will review when available.  02/01/2023 transthoracic echocardiogram report. IMPRESSIONS:   1. Left ventricular ejection fraction, by estimation, is 50%. Left  ventricular ejection fraction by 3D volume is 47 %. The left ventricle has  mildly decreased function. The left ventricle demonstrates global  hypokinesis. Indeterminate  diastolic filling  due to E-A fusion.   2. Right ventricular systolic function is low normal. The right  ventricular size is moderately enlarged.   3. A small pericardial effusion is present. The pericardial effusion is  anterior to the right ventricle.   4. The mitral valve is normal in structure. Trivial mitral valve  regurgitation. No evidence of mitral stenosis.   5. The aortic valve was not well visualized. Aortic valve regurgitation  is not visualized. No aortic stenosis is present.   6. The inferior vena cava is normal in size with greater than 50%  respiratory variability, suggesting right atrial pressure of 3 mmHg.   Comparison(s): Prior images reviewed side by side. LVEF decreased from  prior.    EKG: Vent. rate 107 BPM PR interval 117 ms QRS duration 83 ms QT/QTcB 375/501 ms P-R-T axes 78 60 83 Sinus tachycardia Consider left ventricular hypertrophy Borderline prolonged QT interval  Assessment and Plan: Principal Problem:   Type 2 diabetes mellitus with hyperglycemia (HCC) Presenting with:   DKA (diabetic ketoacidosis) (HCC) Observation/stepdown. Keep NPO. Continue IV fluids. Continue insulin  infusion. Monitor CBG closely. BMP every 4 hours. BHA every 8 hours. Replace electrolytes as needed. Consult diabetes coordinator. Transition to SQ insulin  per Endo tool.  Active Problems:    GERD (gastroesophageal reflux disease) Continue home PPI.    Hyperlipidemia Continue atorvastatin  80 mg daily.    Hypertension Continue amlodipine  and carvedilol .    Asthma Continue albuterol  MDI.      Advance Care Planning:   Code Status: Full Code   Consults:   Family Communication:   Severity of Illness: The appropriate patient status for this patient is OBSERVATION. Observation status is judged to be reasonable and necessary in order to provide the required intensity of service to ensure the patient's safety. The patient's presenting symptoms, physical exam findings, and initial radiographic and laboratory data in the context of their medical condition is felt to place them at decreased risk for further clinical deterioration. Furthermore, it is anticipated that the patient will be medically stable for discharge from the hospital within 2 midnights of admission.   Author: Alm Dorn Castor, MD 03/02/2024 1:04 PM  For on call review [email or web address].   This document was prepared using Dragon voice recognition software and may contain some unintended transcription errors.     [1]  Allergies Allergen Reactions   Lisinopril  Swelling    Angioedema   Oxycodone -Acetaminophen  Nausea Only   Latex Itching, Swelling and Rash   Morphine Itching and Rash   "

## 2024-03-02 NOTE — ED Triage Notes (Signed)
 N/V/D x3 days with diffuse abd ain. CBG's 300's at home. 326 for EMS. Has also not been able to afford her insulin  and has been off for a week - howver has also not been able to eat. Some tachypnea for EMS.

## 2024-03-02 NOTE — ED Notes (Addendum)
 CBG is 538.  DId not transfer in system

## 2024-03-02 NOTE — Progress Notes (Signed)
" °  Progress Note   Patient: [name] FMW:[id number] DOB: 03/02/1965 DOA: 03/02/2024     0  The nursing staff reported a potassium level of 7.1 mmol/L.  Several hours ago it was 3.4 mmol/L while she was receiving insulin  and IV fluids.  KCl 10 mEq IVPB x 4 was ordered at that time.  Three of these bags have been infused.  I will give calcium  gluconate, hold KCl, continue IVF/insulin  infusion and obtaining a stat BMP.  Author: Alm Dorn Castor, MD 03/02/2024 6:17 PM  For on call review [email or web address].  "

## 2024-03-02 NOTE — ED Notes (Signed)
 ISTAT CHEM 8 done..Abnormal results. Consulted the nurse and the doctor.

## 2024-03-02 NOTE — Inpatient Diabetes Management (Signed)
 Inpatient Diabetes Program Recommendations  AACE/ADA: New Consensus Statement on Inpatient Glycemic Control (2015)  Target Ranges:  Prepandial:   less than 140 mg/dL      Peak postprandial:   less than 180 mg/dL (1-2 hours)      Critically ill patients:  140 - 180 mg/dL   Lab Results  Component Value Date   GLUCAP 171 (H) 03/02/2024   HGBA1C 7.4 (H) 12/31/2022    Review of Glycemic Control  Diabetes history: DM2 Outpatient Diabetes medications: Basaglar  40 units at bedtime, metformin  500 mg daily, Ozempic  0.5 mg weekly (not taking) Current orders for Inpatient glycemic control: IV insulin  per EndoTool for hyperglycemia  HgbA1C - please order  Inpatient Diabetes Program Recommendations:    Give Semglee  1-2 hours prior to discontinuation of drip.  Please consider:  Semglee  20 units at bedtime  Novolog  0-9 Q4H x 12H, then TID with meals and 0-5 HS  Add Novolog  4 units TID with meals if eating > 50%  HgbA1C to assess glycemic control prior to admission  Briefly spoke with pt in ED regarding her diabetes and hyperglycemia of 606 on admission. Pt lost insurance in Dec and ran out of Basaglar  about a week ago. Lost glucose meter and feels overwhelmed by everything. Wants to change to plain Medicaid instead of Medicaid family planning. Needs diabetes education on blood sugar monitoring, f/u with PCP, updated HgbA1C, TOC consult for Medicaid status, assistance with obtaining meds.  F/U in am.   Thank you. Shona Brandy, RD, LDN, CDCES Inpatient Diabetes Coordinator [phone]

## 2024-03-02 NOTE — ED Provider Notes (Signed)
 " [place] EMERGENCY DEPARTMENT AT [facility] Provider Note   CSN: [id number] Arrival date & time: 03/02/24  9250     Patient presents with: Nausea and Emesis   [name] is a 58 y.o. female.  With pertinent medical history of type 2 diabetes, hypertension, cardiomyopathy, PE on anticoagulation, asthma, stage III CKD.   Patient is here for evaluation of abdominal pain, N/V/D, intermittent tachypnea, increased urinary frequency, and generally feeling unwell for the past 3 to 4 days.  Patient has not had access to her insulin  for the past 3 to 4 days due to financial constraints.  States she also has not been taking her other daily medications secondary to nausea vomiting.  Pertinent medical history of type 2 diabetes, CKD, cardiomyopathy, and PE taking Eliquis . Denies shortness of breath. Does report intermittent left sided chest pain. Patient requesting ice water. Patient reports with previous PE she was not having shortness of breath so she cannot say if current symptoms feel like prior PE. Patient report to social drinking and denies heavy drinking history. Last alcohol intake at least 6 days ago. Reports history of pancreatitis and cholecystectomy.   Patient last hospitalized March 2025 due to intractable nausea, vomiting with hematemesis.  Felt to be due to a Mallory-Weiss tear with clear CTA scan of abdomen.  Patient hospitalized on December 2024 secondary to pulmonary embolism.  The history is provided by the patient.  Emesis Severity:  Moderate Duration:  4 days      Prior to Admission medications  Medication Sig Start Date End Date Taking? Authorizing Provider  albuterol  (VENTOLIN  HFA) 108 (90 Base) MCG/ACT inhaler Inhale 2 puffs into the lungs every 6 (six) hours as needed for wheezing or shortness of breath. 08/03/20  Yes Saguier, [place], PA-C  amLODipine  (NORVASC ) 5 MG tablet Take 1 tablet by mouth once daily 12/11/23  Yes Saguier, [place], PA-C  apixaban   (ELIQUIS ) 5 MG TABS tablet Take 1 tablet (5 mg total) by mouth 2 (two) times daily. 08/25/23  Yes Saguier, [place], PA-C  atorvastatin  (LIPITOR ) 80 MG tablet Take 1 tablet (80 mg total) by mouth daily. 01/20/23  Yes Saguier, [place], PA-C  baclofen  (LIORESAL ) 10 MG tablet Take 1 tablet (10 mg total) by mouth 3 (three) times daily. Patient taking differently: Take 10 mg by mouth 3 (three) times daily as needed for muscle spasms. 02/05/24  Yes Saguier, [place], PA-C  carvedilol  (COREG ) 25 MG tablet Take 1 tablet (25 mg total) by mouth 2 (two) times daily with a meal. 09/22/23  Yes Saguier, [place], PA-C  gabapentin  (NEURONTIN ) 300 MG capsule TAKE 2 CAPSULES BY MOUTH THREE TIMES DAILY 02/12/24  Yes Saguier, Dallas, PA-C  Insulin  Glargine (BASAGLAR  KWIKPEN) 100 UNIT/ML INJECT 40 UNITS SUBCUTANEOUSLY AT BEDTIME Patient taking differently: Inject 40 Units into the skin at bedtime. 02/23/24  Yes Saguier, [place], PA-C  metFORMIN  (GLUCOPHAGE -XR) 500 MG 24 hr tablet Take 1 tablet by mouth once daily with breakfast 09/21/23  Yes Saguier, Dallas, PA-C  nitroGLYCERIN  (NITROSTAT ) 0.4 MG SL tablet Place 1 tablet (0.4 mg total) under the tongue every 5 (five) minutes as needed for chest pain. 08/25/23  Yes Saguier, Dallas, PA-C  Oxycodone  HCl 10 MG TABS Take 1 tablet (10 mg total) by mouth 3 (three) times daily as needed for severe pain 08/25/23  Yes Saguier, Dallas, PA-C  OZEMPIC , 0.25 OR 0.5 MG/DOSE, 2 MG/3ML SOPN Inject 0.5 mg into the skin once a week. mondays 10/14/22  Yes [provider]  pantoprazole  (PROTONIX ) 40 MG tablet Take 1 tablet (40 mg total) by mouth daily. 09/11/23  Yes Yacopino, Jessica L, NP  promethazine  (PHENERGAN ) 12.5 MG tablet Take 1 tablet (12.5 mg total) by mouth at bedtime. Patient taking differently: Take 12.5 mg by mouth at bedtime as needed for nausea or vomiting. 06/04/23  Yes May, Deanna J, NP  sertraline  (ZOLOFT ) 25 MG tablet Take 1 tablet (25 mg total) by mouth daily. 08/25/23  Yes Saguier,  [place], PA-C  topiramate  (TOPAMAX ) 50 MG tablet Take 1 tablet (50 mg total) by mouth 2 (two) times daily. Patient taking differently: Take 50 mg by mouth 2 (two) times daily as needed (headache). 01/13/22  Yes Saguier, Dallas, PA-C  VITAMIN D PO Take 5,000 Units by mouth daily. Patient not taking: Reported on 03/02/2024    [provider]    Allergies: Lisinopril , Oxycodone -acetaminophen , Latex, and Morphine    Review of Systems  Gastrointestinal:  Positive for vomiting.   Vitals:   03/02/24 1026 03/02/24 1245 03/02/24 1600 03/02/24 1700  BP: (!) 179/112 (!) 137/103 (!) 135/117 (!) 163/117  Pulse: 98 (!) 118  (!) 114  Resp: 20 16 19 13   Temp:  98.9 F (37.2 C)  98.4 F (36.9 C)  TempSrc:  Oral  Oral  SpO2: 97% 97%  98%    Updated Vital Signs BP (!) 163/117   Pulse (!) 114   Temp 98.4 F (36.9 C) (Oral)   Resp 13   SpO2 98%   Physical Exam Vitals and nursing note reviewed.  Constitutional:      General: She is in acute distress.     Appearance: Normal appearance. She is not toxic-appearing or diaphoretic.     Comments: Patient visibly uncomfortable and unable to settle, constantly rolling back-and-forth attempting to get comfortable secondary to abdominal pain and nausea and removing and reapplying blanket as she feels hot and cold.  HENT:     Head: Normocephalic and atraumatic.     Nose: Nose normal.     Mouth/Throat:     Mouth: Mucous membranes are moist.  Eyes:     General: No scleral icterus.    Extraocular Movements: Extraocular movements intact.     Conjunctiva/sclera: Conjunctivae normal.  Cardiovascular:     Rate and Rhythm: Regular rhythm. Tachycardia present.     Pulses: Normal pulses.     Heart sounds: Normal heart sounds.  Pulmonary:     Effort: No respiratory distress.     Breath sounds: Normal breath sounds. No stridor. No wheezing, rhonchi or rales.     Comments: Tachypneic with good oxygen saturation and waveform. Abdominal:     General:  Abdomen is flat. Bowel sounds are normal. There is no distension.     Palpations: Abdomen is soft.     Tenderness: There is abdominal tenderness. There is guarding. There is no right CVA tenderness or left CVA tenderness.  Musculoskeletal:        General: No swelling or tenderness. Normal range of motion.     Cervical back: Normal range of motion.     Right lower leg: No edema.     Left lower leg: No edema.  Skin:    General: Skin is warm and dry.     Capillary Refill: Capillary refill takes less than 2 seconds.     Coloration: Skin is not jaundiced or pale.  Neurological:     Mental Status: She is alert and oriented to person, place, and time.     (  all labs ordered are listed, but only abnormal results are displayed) Labs Reviewed  CBC - Abnormal; Notable for the following components:      Result Value   RBC 5.93 (*)    Hemoglobin 17.4 (*)    HCT 50.9 (*)    All other components within normal limits  URINALYSIS, ROUTINE W REFLEX MICROSCOPIC - Abnormal; Notable for the following components:   APPearance CLOUDY (*)    Glucose, UA >=500 (*)    Hgb urine dipstick MODERATE (*)    Ketones, ur 20 (*)    Protein, ur >=300 (*)    Nitrite POSITIVE (*)    Leukocytes,Ua TRACE (*)    Bacteria, UA MANY (*)    All other components within normal limits  COMPREHENSIVE METABOLIC PANEL WITH GFR - Abnormal; Notable for the following components:   Sodium 131 (*)    Chloride 88 (*)    Glucose, Bld 606 (*)    BUN 39 (*)    Creatinine, Ser 1.85 (*)    Total Protein 9.0 (*)    Alkaline Phosphatase 145 (*)    GFR, Estimated 31 (*)    Anion gap 20 (*)    All other components within normal limits  LIPASE, BLOOD - Abnormal; Notable for the following components:   Lipase 412 (*)    All other components within normal limits  BLOOD GAS, VENOUS - Abnormal; Notable for the following components:   pH, Ven 7.59 (*)    pCO2, Ven 29 (*)    pO2, Ven <31 (*)    Acid-Base Excess 6.6 (*)    All other  components within normal limits  D-DIMER, QUANTITATIVE - Abnormal; Notable for the following components:   D-Dimer, Quant 1.40 (*)    All other components within normal limits  BASIC METABOLIC PANEL WITH GFR - Abnormal; Notable for the following components:   Potassium 3.4 (*)    Glucose, Bld 146 (*)    BUN 37 (*)    Creatinine, Ser 1.65 (*)    GFR, Estimated 35 (*)    All other components within normal limits  CBG MONITORING, ED - Abnormal; Notable for the following components:   Glucose-Capillary 538 (*)    All other components within normal limits  I-STAT CHEM 8, ED - Abnormal; Notable for the following components:   Sodium 132 (*)    Chloride 93 (*)    BUN 42 (*)    Creatinine, Ser 1.70 (*)    Glucose, Bld 523 (*)    Calcium , Ion 1.05 (*)    Hemoglobin 18.4 (*)    HCT 54.0 (*)    All other components within normal limits  CBG MONITORING, ED - Abnormal; Notable for the following components:   Glucose-Capillary 484 (*)    All other components within normal limits  CBG MONITORING, ED - Abnormal; Notable for the following components:   Glucose-Capillary 445 (*)    All other components within normal limits  CBG MONITORING, ED - Abnormal; Notable for the following components:   Glucose-Capillary 287 (*)    All other components within normal limits  CBG MONITORING, ED - Abnormal; Notable for the following components:   Glucose-Capillary 115 (*)    All other components within normal limits  CBG MONITORING, ED - Abnormal; Notable for the following components:   Glucose-Capillary 144 (*)    All other components within normal limits  CBG MONITORING, ED - Abnormal; Notable for the following components:   Glucose-Capillary 198 (*)  All other components within normal limits  CBG MONITORING, ED - Abnormal; Notable for the following components:   Glucose-Capillary 223 (*)    All other components within normal limits  CBG MONITORING, ED - Abnormal; Notable for the following  components:   Glucose-Capillary 343 (*)    All other components within normal limits  URINE CULTURE  BETA-HYDROXYBUTYRIC ACID  MAGNESIUM   URINALYSIS, ROUTINE W REFLEX MICROSCOPIC  BASIC METABOLIC PANEL WITH GFR  BASIC METABOLIC PANEL WITH GFR  BASIC METABOLIC PANEL WITH GFR  BETA-HYDROXYBUTYRIC ACID  BETA-HYDROXYBUTYRIC ACID  BETA-HYDROXYBUTYRIC ACID  PHOSPHORUS  CBC  COMPREHENSIVE METABOLIC PANEL WITH GFR    EKG: EKG Interpretation Date/Time:  Wednesday March 02 2024 09:47:09 EST Ventricular Rate:  107 PR Interval:  117 QRS Duration:  83 QT Interval:  375 QTC Calculation: 501 R Axis:   60  Text Interpretation: Sinus tachycardia Consider left ventricular hypertrophy Borderline prolonged QT interval Confirmed by Levander Houston [phone]) on 03/02/2024 9:54:17 AM  Radiology: CT ABDOMEN PELVIS WO CONTRAST Result Date: 03/02/2024 EXAM: CT ABDOMEN AND PELVIS WITHOUT CONTRAST 03/02/2024 12:15:19 PM TECHNIQUE: CT of the abdomen and pelvis was performed without the administration of intravenous contrast. Multiplanar reformatted images are provided for review. Automated exposure control, iterative reconstruction, and/or weight-based adjustment of the mA/kV was utilized to reduce the radiation dose to as low as reasonably achievable. COMPARISON: 04/28/2023 CLINICAL HISTORY: Central abdominal pain over the last 4 days with nausea and vomiting. FINDINGS: LOWER CHEST: No acute abnormality. LIVER: The liver is unremarkable. GALLBLADDER AND BILE DUCTS: Cholecystectomy. No biliary ductal dilatation. SPLEEN: No acute abnormality. PANCREAS: No acute abnormality. ADRENAL GLANDS: No acute abnormality. KIDNEYS, URETERS AND BLADDER: No stones in the kidneys or ureters. No hydronephrosis. No perinephric or periureteral stranding. Borderline wall thickening in the urinary bladder, correlate with urine analysis and assessing for urinary tract infection. GI AND BOWEL: Stomach demonstrates no acute abnormality.  There is no bowel obstruction. Normal appendix. PERITONEUM AND RETROPERITONEUM: No ascites. No free air. VASCULATURE: Aorta is normal in caliber. Moderate aortoiliac atheromatous vascular calcifications. LYMPH NODES: Prior bilateral pelvic lymph node dissection. REPRODUCTIVE ORGANS: Uterus absent. BONES AND SOFT TISSUES: Inferior endplate irregularity at L4 is stable from 04/28/2023 and accordingly likely to be degenerative and due to confluent Schmorl's nodes or prior endplate fracture rather than active discitis. Lower lumbar spondylosis and degenerative disc disease contributing to foraminal narrowing at L3-L4, L4-L5, and L5-S1 with suspected central stenosis at L4-L5 and L5-S1. No focal soft tissue abnormality. IMPRESSION: 1. Borderline urinary bladder wall thickening, consider urinalysis for to rule out urinary tract infection. 2. Lower lumbar spondylosis and degenerative disc disease with foraminal narrowing at L3-4, L4-5, and L5-S1, and suspected central canal stenosis at L4-5 and L5-S1. 3. Moderate aortoiliac atherosclerotic calcifications. Electronically signed by: Ryan Salvage MD 03/02/2024 12:40 PM EST RP Workstation: [id number]     .Critical Care  Performed by: Rosina Almarie LABOR, PA-C Authorized by: Rosina Almarie LABOR, PA-C   Critical care provider statement:    Critical care time (minutes):  30   Critical care was necessary to treat or prevent imminent or life-threatening deterioration of the following conditions:  Metabolic crisis   Critical care was time spent personally by me on the following activities:  Development of treatment plan with patient or surrogate, evaluation of patient's response to treatment, discussions with consultants, examination of patient, obtaining history from patient or surrogate, ordering and performing treatments and interventions, ordering and review of laboratory studies, pulse oximetry, re-evaluation of patient's condition  and review of old charts     Medications Ordered in the ED  insulin  regular, human (MYXREDLIN ) 100 units/ 100 mL infusion (2.6 Units/hr Intravenous Rate/Dose Change 03/02/24 1553)  lactated ringers  infusion (0 mLs Intravenous Stopped 03/02/24 1246)  dextrose  5 % in lactated ringers  infusion ( Intravenous New Bag/Given 03/02/24 1246)  dextrose  50 % solution 0-50 mL (has no administration in time range)  amLODipine  (NORVASC ) tablet 5 mg (5 mg Oral Given 03/02/24 1310)  carvedilol  (COREG ) tablet 25 mg (25 mg Oral Given 03/02/24 1700)  apixaban  (ELIQUIS ) tablet 5 mg (5 mg Oral Given 03/02/24 1310)  acetaminophen  (TYLENOL ) tablet 650 mg (has no administration in time range)    Or  acetaminophen  (TYLENOL ) suppository 650 mg (has no administration in time range)  cefTRIAXone  (ROCEPHIN ) 1 g in sodium chloride  0.9 % 100 mL IVPB (has no administration in time range)  potassium chloride  10 mEq in 100 mL IVPB (10 mEq Intravenous New Bag/Given 03/02/24 1602)  HYDROmorphone  (DILAUDID ) injection 0.75 mg (has no administration in time range)  metoCLOPramide  (REGLAN ) injection 10 mg (10 mg Intravenous Given 03/02/24 1557)  promethazine  (PHENERGAN ) 12.5 mg in sodium chloride  0.9 % 50 mL IVPB (has no administration in time range)  ondansetron  (ZOFRAN ) tablet 4 mg (has no administration in time range)    Or  ondansetron  (ZOFRAN ) injection 4 mg (has no administration in time range)  atorvastatin  (LIPITOR ) tablet 80 mg (has no administration in time range)  baclofen  (LIORESAL ) tablet 10 mg (has no administration in time range)  gabapentin  (NEURONTIN ) capsule 600 mg (has no administration in time range)  pantoprazole  (PROTONIX ) EC tablet 40 mg (has no administration in time range)  albuterol  (PROVENTIL ) (2.5 MG/3ML) 0.083% nebulizer solution 2.5 mg (has no administration in time range)  sodium chloride  0.9 % bolus 1,000 mL (0 mLs Intravenous Stopped 03/02/24 1024)  HYDROmorphone  (DILAUDID ) injection 0.5 mg (0.5 mg Intravenous Given 03/02/24 0917)   metoCLOPramide  (REGLAN ) injection 5 mg (5 mg Intravenous Given 03/02/24 0927)  promethazine  (PHENERGAN ) 6.25 mg/NS 50 mL IVPB (0 mg Intravenous Stopped 03/02/24 1205)  HYDROmorphone  (DILAUDID ) injection 0.5 mg (0.5 mg Intravenous Given 03/02/24 1139)  cefTRIAXone  (ROCEPHIN ) 2 g in sodium chloride  0.9 % 100 mL IVPB (0 g Intravenous Stopped 03/02/24 1320)  lactated ringers  bolus 1,000 mL (0 mLs Intravenous Stopped 03/02/24 1558)  HYDROmorphone  (DILAUDID ) injection 0.75 mg (0.75 mg Intravenous Given 03/02/24 1557)    Patient presents to the ED for concern of abdominal pain, N/V, tachypnea, this involves an extensive number of treatment options, and is a complaint that carries with it a high risk of complications and morbidity.  The differential diagnosis includes DKA, hyperglycemia, HHS, gastritis, PE, UTI, sepsis, pancreatitis, biliary disease, peptic ulcer disease, GERD, drug-induced.   Co morbidities that complicate the patient evaluation  Type 2 diabetes Cardiomyopathy Hypertension GERD Stage III CKD PE, taking Eliquis    Additional history obtained:  Additional history obtained from Outside Medical Records and Past Admission   External records from outside source obtained and reviewed including review of previous labs for comparison and review of previous hospital admissions.   Lab Tests:  I Ordered, and personally interpreted labs.  The pertinent results include:   Elevated Glucose 500-600 Anion gap of 20 Elevated creatinine of 1.85 (baseline ~1.2) and BUN of 39 (normal baseline). GFR decreased to 31 (baseline ~50). Hyponatremia of 131 and decreased chloride of 88. Venous blood gas: pH 7.59, pCO2 29 Elevated lipase of 412. Normal AST, ALT and total bilirubin, elevated alk  phos of 145. Urinalysis indicative of infection.   Imaging Studies ordered:  I ordered imaging studies including CT abdomen/pelvis without contrast  I independently visualized and interpreted imaging which  showed: Borderline urinary bladder wall thickening. No acute abnormalities of pancreas.  I agree with the radiologist interpretation   Cardiac Monitoring:  The patient was maintained on a cardiac monitor.  I personally viewed and interpreted the cardiac monitored which showed an underlying rhythm of: Sinus tachycardia with no evidence of STEMI   Medicines ordered and prescription drug management:  I ordered medication including NS bolus, dilaudid , reglan , promethazine , insulin   for hyperglycemia, abdominal pain, and nausea. Reevaluation of the patient after these medicines showed that the patient improved I have reviewed the patients home medicines and have made adjustments as needed   Test Considered:  CT Angio Chest PE and CT Abdomen/Pelvis with contrast: Deferred at this time due to AKI.   Critical Interventions:  Insulin  for hyperglycemia.   Consultations Obtained:  I requested consultation with the hospitalist Charolotte Castor MD),  and discussed lab and imaging findings as well as pertinent plan - they recommend: Admission to hospital for ongoing evaluation and management.   Problem List / ED Course:  Clinical Course as of 03/02/24 1736  Wed Mar 02, 2024  1058 On reassessment, patient is accompanied by her sister. Patient appears much more comfortable. No longer tachyneic. Still complaining of nausea and abdominal pain. Denies shortness of breath or chest pain. [EA]    Clinical Course User Index [EA] Rosina Almarie LABOR, PA-C    Hyperglycemia. Glucose found to be in the 500s. Initiated on IVF, insulin , antiemetics, and pain management. Tachypnea, heart rate, and CBG improved with time and medication administration. CT abdomen pelvis without contrast with no acute findings. Anion gap with high pH, low CO2, and normal bicarbonate.  AKI. History of CKD Stage III. Elevated creatinine and BUN compared to baseline. Cystitis. Urinalysis indicate of infection with nitrites and  bacteria seen. No CVA tenderness. Given IV rocephin . Hypertension. EKG with no evidence of STEMI. Hypertension improved some with hyperglycemia treatment interventions and pain management. Given home HTN medications. No red flag symptoms or physical exam findings.  Elevated lipase. History of cholecystectomy.  CT abdomen/pelvis without contrast shows no abnormalities of the pancreas. Elevated D-dimer. D-dimer checked in the setting of shortness of breath, history of PE, and not taking anticoagulants for the past 3-4 days. CT Angio Chest not performed due to AKI. Will warrant further investigation during admission.   Reevaluation:  After the interventions noted above, I reevaluated the patient and found that they have :improved   Social Determinants of Health:  Housing instability Limited access to medications secondary to financial stress   Dispostion:  Admission to hospital for ongoing evaluation and management of hyperglycemia, AKI, hypertension, and cystitis.  Clinical Course as of 03/02/24 1736  Wed Mar 02, 2024  1058 On reassessment, patient is accompanied by her sister. Patient appears much more comfortable. No longer tachyneic. Still complaining of nausea and abdominal pain. Denies shortness of breath or chest pain. [EA]    Clinical Course User Index [EA] Rosina Almarie LABOR, PA-C                                 Medical Decision Making Amount and/or Complexity of Data Reviewed Labs: ordered. Radiology: ordered.  Risk Prescription drug management. Decision regarding hospitalization.   This note was produced using Dragon  Medical voice recognition. While the provider has reviewed and verified all clinical information, transcription errors may remain.    Final diagnoses:  None    ED Discharge Orders     None          Rosina Almarie DELENA DEVONNA 03/02/24 1737  "

## 2024-03-03 ENCOUNTER — Other Ambulatory Visit (HOSPITAL_BASED_OUTPATIENT_CLINIC_OR_DEPARTMENT_OTHER): Payer: Self-pay

## 2024-03-03 ENCOUNTER — Other Ambulatory Visit (HOSPITAL_COMMUNITY): Payer: Self-pay

## 2024-03-03 DIAGNOSIS — E1165 Type 2 diabetes mellitus with hyperglycemia: Secondary | ICD-10-CM

## 2024-03-03 DIAGNOSIS — I10 Essential (primary) hypertension: Secondary | ICD-10-CM

## 2024-03-03 DIAGNOSIS — J45909 Unspecified asthma, uncomplicated: Secondary | ICD-10-CM

## 2024-03-03 DIAGNOSIS — K219 Gastro-esophageal reflux disease without esophagitis: Secondary | ICD-10-CM

## 2024-03-03 LAB — COMPREHENSIVE METABOLIC PANEL WITH GFR
ALT: 13 U/L (ref 0–44)
AST: 23 U/L (ref 15–41)
Albumin: 3.1 g/dL — ABNORMAL LOW (ref 3.5–5.0)
Alkaline Phosphatase: 103 U/L (ref 38–126)
Anion gap: 6 (ref 5–15)
BUN: 28 mg/dL — ABNORMAL HIGH (ref 6–20)
CO2: 31 mmol/L (ref 22–32)
Calcium: 8.9 mg/dL (ref 8.9–10.3)
Chloride: 101 mmol/L (ref 98–111)
Creatinine, Ser: 1.28 mg/dL — ABNORMAL HIGH (ref 0.44–1.00)
GFR, Estimated: 48 mL/min — ABNORMAL LOW
Glucose, Bld: 121 mg/dL — ABNORMAL HIGH (ref 70–99)
Potassium: 3.4 mmol/L — ABNORMAL LOW (ref 3.5–5.1)
Sodium: 137 mmol/L (ref 135–145)
Total Bilirubin: 0.3 mg/dL (ref 0.0–1.2)
Total Protein: 6.5 g/dL (ref 6.5–8.1)

## 2024-03-03 LAB — BASIC METABOLIC PANEL WITH GFR
Anion gap: 7 (ref 5–15)
BUN: 30 mg/dL — ABNORMAL HIGH (ref 6–20)
CO2: 29 mmol/L (ref 22–32)
Calcium: 9 mg/dL (ref 8.9–10.3)
Chloride: 99 mmol/L (ref 98–111)
Creatinine, Ser: 1.42 mg/dL — ABNORMAL HIGH (ref 0.44–1.00)
GFR, Estimated: 42 mL/min — ABNORMAL LOW
Glucose, Bld: 243 mg/dL — ABNORMAL HIGH (ref 70–99)
Potassium: 4.2 mmol/L (ref 3.5–5.1)
Sodium: 135 mmol/L (ref 135–145)

## 2024-03-03 LAB — CBC
HCT: 39.2 % (ref 36.0–46.0)
Hemoglobin: 12.9 g/dL (ref 12.0–15.0)
MCH: 29.4 pg (ref 26.0–34.0)
MCHC: 32.9 g/dL (ref 30.0–36.0)
MCV: 89.3 fL (ref 80.0–100.0)
Platelets: 287 K/uL (ref 150–400)
RBC: 4.39 MIL/uL (ref 3.87–5.11)
RDW: 14.4 % (ref 11.5–15.5)
WBC: 10.8 K/uL — ABNORMAL HIGH (ref 4.0–10.5)
nRBC: 0 % (ref 0.0–0.2)

## 2024-03-03 LAB — CBG MONITORING, ED
Glucose-Capillary: 101 mg/dL — ABNORMAL HIGH (ref 70–99)
Glucose-Capillary: 152 mg/dL — ABNORMAL HIGH (ref 70–99)
Glucose-Capillary: 164 mg/dL — ABNORMAL HIGH (ref 70–99)
Glucose-Capillary: 179 mg/dL — ABNORMAL HIGH (ref 70–99)
Glucose-Capillary: 206 mg/dL — ABNORMAL HIGH (ref 70–99)
Glucose-Capillary: 261 mg/dL — ABNORMAL HIGH (ref 70–99)
Glucose-Capillary: 67 mg/dL — ABNORMAL LOW (ref 70–99)
Glucose-Capillary: 74 mg/dL (ref 70–99)

## 2024-03-03 LAB — BETA-HYDROXYBUTYRIC ACID
Beta-Hydroxybutyric Acid: <0.05 mmol/L — ABNORMAL LOW (ref 0.05–0.27)
Beta-Hydroxybutyric Acid: 0.16 mmol/L (ref 0.05–0.27)

## 2024-03-03 MED ORDER — CARVEDILOL 25 MG PO TABS
25.0000 mg | ORAL_TABLET | Freq: Two times a day (BID) | ORAL | 1 refills | Status: AC
  Filled 2024-03-03: qty 60, 30d supply, fill #0

## 2024-03-03 MED ORDER — SODIUM CHLORIDE 0.9 % IV BOLUS
1000.0000 mL | Freq: Once | INTRAVENOUS | Status: AC
  Administered 2024-03-03: 1000 mL via INTRAVENOUS

## 2024-03-03 MED ORDER — METFORMIN HCL ER 500 MG PO TB24
500.0000 mg | ORAL_TABLET | Freq: Every day | ORAL | 3 refills | Status: DC
  Filled 2024-03-03: qty 90, 90d supply, fill #0

## 2024-03-03 MED ORDER — APIXABAN 5 MG PO TABS
5.0000 mg | ORAL_TABLET | Freq: Two times a day (BID) | ORAL | 1 refills | Status: AC
  Filled 2024-03-03 – 2024-03-16 (×2): qty 60, 30d supply, fill #0

## 2024-03-03 MED ORDER — HYDROMORPHONE HCL 1 MG/ML IJ SOLN
0.5000 mg | Freq: Once | INTRAMUSCULAR | Status: AC
  Administered 2024-03-03: 0.5 mg via INTRAVENOUS
  Filled 2024-03-03: qty 1

## 2024-03-03 MED ORDER — OXYCODONE HCL 5 MG PO TABS
10.0000 mg | ORAL_TABLET | Freq: Three times a day (TID) | ORAL | Status: DC | PRN
  Filled 2024-03-03: qty 2

## 2024-03-03 MED ORDER — ONDANSETRON HCL 4 MG PO TABS
4.0000 mg | ORAL_TABLET | Freq: Four times a day (QID) | ORAL | 0 refills | Status: AC | PRN
  Filled 2024-03-03: qty 20, 5d supply, fill #0

## 2024-03-03 MED ORDER — AMLODIPINE BESYLATE 5 MG PO TABS
5.0000 mg | ORAL_TABLET | Freq: Once | ORAL | Status: AC
  Administered 2024-03-03: 5 mg via ORAL
  Filled 2024-03-03: qty 1

## 2024-03-03 MED ORDER — OZEMPIC (0.25 OR 0.5 MG/DOSE) 2 MG/3ML ~~LOC~~ SOPN
0.5000 mg | PEN_INJECTOR | SUBCUTANEOUS | 2 refills | Status: AC
  Filled 2024-03-03: qty 3, 28d supply, fill #0

## 2024-03-03 MED ORDER — AMLODIPINE BESYLATE 10 MG PO TABS
10.0000 mg | ORAL_TABLET | Freq: Every day | ORAL | 1 refills | Status: DC
  Filled 2024-03-03: qty 30, 30d supply, fill #0

## 2024-03-03 MED ORDER — SODIUM CHLORIDE 0.9 % IV SOLN
2.0000 g | INTRAVENOUS | Status: DC
  Administered 2024-03-03: 2 g via INTRAVENOUS
  Filled 2024-03-03: qty 20

## 2024-03-03 MED ORDER — GABAPENTIN 300 MG PO CAPS
600.0000 mg | ORAL_CAPSULE | Freq: Three times a day (TID) | ORAL | 1 refills | Status: AC
  Filled 2024-03-03: qty 180, 30d supply, fill #0

## 2024-03-03 MED ORDER — BASAGLAR KWIKPEN 100 UNIT/ML ~~LOC~~ SOPN
PEN_INJECTOR | SUBCUTANEOUS | 3 refills | Status: DC
  Filled 2024-03-03: qty 12, 30d supply, fill #0

## 2024-03-03 MED ORDER — POTASSIUM CHLORIDE CRYS ER 20 MEQ PO TBCR
30.0000 meq | EXTENDED_RELEASE_TABLET | Freq: Once | ORAL | Status: AC
  Administered 2024-03-03: 30 meq via ORAL
  Filled 2024-03-03: qty 1

## 2024-03-03 MED ORDER — INSULIN ASPART 100 UNIT/ML IJ SOLN
0.0000 [IU] | INTRAMUSCULAR | Status: DC
  Administered 2024-03-03: 2 [IU] via SUBCUTANEOUS
  Filled 2024-03-03: qty 2

## 2024-03-03 MED ORDER — ALUM & MAG HYDROXIDE-SIMETH 200-200-20 MG/5ML PO SUSP
30.0000 mL | Freq: Once | ORAL | Status: AC
  Administered 2024-03-03: 30 mL via ORAL
  Filled 2024-03-03: qty 30

## 2024-03-03 MED ORDER — LACTATED RINGERS IV BOLUS
1000.0000 mL | Freq: Once | INTRAVENOUS | Status: AC
  Administered 2024-03-03: 1000 mL via INTRAVENOUS

## 2024-03-03 MED ORDER — PANTOPRAZOLE SODIUM 40 MG PO TBEC
40.0000 mg | DELAYED_RELEASE_TABLET | Freq: Every day | ORAL | 3 refills | Status: DC
  Filled 2024-03-03: qty 30, 30d supply, fill #0

## 2024-03-03 MED ORDER — BACLOFEN 10 MG PO TABS
10.0000 mg | ORAL_TABLET | Freq: Three times a day (TID) | ORAL | 0 refills | Status: AC | PRN
  Filled 2024-03-03: qty 30, 10d supply, fill #0

## 2024-03-03 MED ORDER — AMLODIPINE BESYLATE 5 MG PO TABS: 10.0000 mg | ORAL_TABLET | Freq: Every day | ORAL | Status: DC

## 2024-03-03 MED ORDER — ALBUTEROL SULFATE HFA 108 (90 BASE) MCG/ACT IN AERS
2.0000 | INHALATION_SPRAY | Freq: Four times a day (QID) | RESPIRATORY_TRACT | 2 refills | Status: AC | PRN
  Filled 2024-03-03: qty 6.7, 20d supply, fill #0

## 2024-03-03 MED ORDER — TRAMADOL HCL 50 MG PO TABS
50.0000 mg | ORAL_TABLET | Freq: Two times a day (BID) | ORAL | Status: DC | PRN
  Administered 2024-03-03: 50 mg via ORAL
  Filled 2024-03-03: qty 1

## 2024-03-03 MED ORDER — PEN NEEDLES 32G X 4 MM MISC
2 refills | Status: AC
  Filled 2024-03-03: qty 100, 25d supply, fill #0

## 2024-03-03 MED ORDER — CEPHALEXIN 500 MG PO CAPS
500.0000 mg | ORAL_CAPSULE | Freq: Three times a day (TID) | ORAL | 0 refills | Status: AC
  Filled 2024-03-03: qty 15, 5d supply, fill #0

## 2024-03-03 MED ORDER — AMLODIPINE BESYLATE 5 MG PO TABS
5.0000 mg | ORAL_TABLET | Freq: Every day | ORAL | 2 refills | Status: AC
  Filled 2024-03-03: qty 90, 90d supply, fill #0

## 2024-03-03 NOTE — Hospital Course (Addendum)
 Partly taken from H&P.  [name] is a 59 y.o. female with medical history significant of abdominal pain, gastroparesis, history of pyelonephritis, history of respiratory failure with hypoxia, left ankle pain, anxiety, asthma, CAD, cardiomegaly, secondary cardiomyopathy, cellulitis of the left lower extremity.  History of cervical cancer, chronic back pain, chronic diastolic heart failure, stage III CKD, chronic pain syndrome, coffee-ground emesis, type 2 diabetes, hyperlipidemia, hypertension, hypomagnesemia, vitamin D deficiency who presented to the emergency department with complaints of urinary frequency, nausea and multiple episodes of emesis. Positive polyuria, polydipsia, and blurred vision.  She ran out of insulin  4 days ago.   On presentation patient was tachycardic and tachypneic, labs with concern of DKA, UA concerning for UTI.  Lipase elevated at 412. CT abdominal and pelvis with borderline urinary bladder wall thickening consistent with UTI.  Patient was started on insulin  infusion with Endo tool, also started on ceftriaxone  for UTI.  Cultures were ordered.  On second lab draw potassium was 7.1, likely a lab error or was drawn from the side potassium supplement was running as potassium on admission was 3.4.  She was given calcium  gluconate, insulin  and a stat repeat came back potassium at 2.8.  1/22: Blood pressure elevated at 167/110.  Labs with potassium of 3.4, creatinine improved to 1.28, beta-hydroxybutyrate at 0.16.  Has been switched to basal and SSI.  Amlodipine  dose was increased to 10 mg daily.  Giving some potassium.  Patient apparently ran out of all of her medications and has not seen her physician for a while.  Per patient she was taking oxycodone  and was asking for a refill which was not provided as it was not appropriate.  She has to follow-up with her primary care provider who was giving her oxycodone  for a refill.  She was given 1 dose of oxycodone  and GI  cocktail for concern of some epigastric pain before discharge.  Patient pretty quickly responded to insulin .  There was no acidosis noted on any of her labs.  She was also given a 5-day course of Keflex  for concern of UTI, prior urine cultures with E. coli only shows intermittent resistance to Cipro .  Current urine cultures are pending.  Patient was given all new prescriptions except oxycodone  as she ran out of all of her medications.  She should continue on current medications and need to have a close follow-up with primary care provider for further assistance.

## 2024-03-03 NOTE — ED Notes (Signed)
 Provider called and states she can not reevaluate patient as she is off campus. Provider states she is contacting another provider to evaluate patient.

## 2024-03-03 NOTE — ED Notes (Signed)
 Provider paged to reevaluate patient status

## 2024-03-03 NOTE — ED Notes (Signed)
 Provider contacted for patients BP dropping. Waiting for response.

## 2024-03-03 NOTE — ED Notes (Signed)
 Provider notifed RN to discharge patient after receiving another bolos of fluid. Patient reieved bolus and BP 122/87. Patient a/o x4 ambulatory with steady gait. Patient educated on home medications.

## 2024-03-03 NOTE — Discharge Instructions (Signed)
 We initially increase your amlodipine  dose but your blood pressure was dropping more than anticipated, please resume your home dose of 5 mg daily.

## 2024-03-03 NOTE — ED Notes (Signed)
 RN walked into patients room and pt appears to be starring off, not responding to name for approximately 1 minute. Patient then did not recall unresponsive episode. Blood sugar checked, Vital signs assessed and BP decreased. Charge nurse and Provider made aware. Provider adjusted home medications, new orders placed and wants to continue with discharge.

## 2024-03-03 NOTE — Progress Notes (Addendum)
 Transition of Care [facility]) - Emergency Department Mini Assessment   Patient Details  Name: [name] MRN: [id number] Date of Birth: 06/30/1965  [facility]) CM/SW Contact:    Camelia JONETTA Cary, RN Phone Number: 03/03/2024, 11:28 AM   Clinical Narrative:  Received consult for medication match. And PCP needs. Pt. Has a PCP listed. RNCM spoke to pt. And confirmed that pt. Has insurance and therefore does not qualify for match. RNCM spoke to EDP, Pharmacist and EDRN to notify of medication needs with no qualification for match.  Medication costs were discussed with Pharmacy and Provider. Pharmacy and Provider working together to decrease cost of medications. Through medication changes, and/or coupons.  ED Mini Assessment:                  Patient Contact and Communications        ,              Choice offered to / list presented to : (P) NA  Admission diagnosis:  DKA (diabetic ketoacidosis) (HCC) [E11.10] Patient Active Problem List   Diagnosis Date Noted   DKA (diabetic ketoacidosis) (HCC) 03/02/2024   Chronic left shoulder pain 09/11/2023   Left lower quadrant abdominal pain 04/30/2023   Hematemesis 04/29/2023   History of pulmonary embolism 04/29/2023   Acute GI bleeding 04/29/2023   Intractable vomiting 04/29/2023   Abnormal Pap smear of cervix    Asthma    Cataract    Acute respiratory failure with hypoxia (HCC) 01/31/2023   Normocytic anemia 01/31/2023   Thrombocytosis 01/31/2023   Cardiomegaly 01/31/2023   Asthma without acute exacerbation 01/31/2023   Hypomagnesemia 01/31/2023   Acute pulmonary embolism (HCC) 01/30/2023   Coffee ground emesis 11/05/2022   Abnormal CT scan 11/05/2022   Acute renal failure superimposed on stage 3a chronic kidney disease (HCC) 11/04/2022   Intractable nausea and vomiting 08/03/2022   Dehydration 08/02/2022   High anion gap metabolic acidosis 08/02/2022   Acute pyelonephritis 08/02/2022    Osteomyelitis of second toe of left foot (HCC) 01/05/2022   Mood disorder 01/05/2022   SIRS (systemic inflammatory response syndrome) (HCC) 05/21/2021   Chronic diastolic HF (heart failure) (HCC) 05/21/2021   Abscess of left lower leg    Cellulitis of left leg 04/22/2021   Sepsis due to cellulitis (HCC) 04/22/2021   Severe sepsis (HCC) 04/22/2021   Acute kidney injury superimposed on chronic kidney disease 04/22/2021   Chest pain, unspecified 04/22/2021   Uncontrolled type 2 diabetes mellitus with hyperglycemia, without long-term current use of insulin  (HCC) 04/22/2021   Pain of left calf 04/19/2021   Viral upper respiratory tract infection 11/30/2020   Trigeminal neuralgia    Cervical cancer (HCC)    Chronic back pain    Coronary artery disease    GERD (gastroesophageal reflux disease)    Hyperlipidemia    Hypertension    Dyslipidemia 02/01/2019   Cardiac murmur 02/01/2019   Lumbar spinal stenosis 02/11/2018   Neuropathy 07/20/2017   Abdominal pain with vomiting    Nausea & vomiting 07/07/2017   Acute cystitis 07/07/2017   CAD (coronary artery disease) 07/07/2017   Metatarsalgia of both feet 03/11/2017   Diabetic gastroparesis associated with type 1 diabetes mellitus (HCC) 11/27/2016   Pure hypercholesterolemia 11/27/2016   Lumbar facet arthropathy 05/14/2016   Chronic pain syndrome 01/23/2016   Diabetic peripheral neuropathy (HCC) 01/23/2016   Anxiety 01/22/2016   History of cervical cancer 08/17/2015   Depression 08/10/2015   Overweight (BMI  25.0-29.9) 07/21/2014   Chronic kidney disease, stage III (moderate) (HCC) 05/23/2014   Diabetes type 2, uncontrolled 05/23/2014   Current use of insulin  (HCC) 05/23/2014   Vitamin D deficiency 04/25/2013   Cerebrovascular disease 09/13/2012   Migraine 09/13/2012   Nonspecific abnormal findings on radiological and examination of skull and head 09/13/2012   Retention of urine 06/25/2011   ANKLE PAIN, LEFT 12/25/2009   Dysuria  12/25/2009   VAGINITIS, CANDIDAL 12/25/2009   PUD (peptic ulcer disease) 09/28/2009   DIABETIC PERIPHERAL NEUROPATHY 09/21/2009   TOBACCO ABUSE 09/21/2009   UTI (urinary tract infection) 09/21/2009   INSOMNIA 09/21/2009   TRANSAMINASES, SERUM, ELEVATED 09/21/2009   Elevation of level of transaminase or lactic acid dehydrogenase (LDH) 09/21/2009   Type 2 diabetes mellitus with stage 3 chronic kidney disease (HCC) 09/14/2009   Nausea with vomiting, unspecified 09/14/2009   ABDOMINAL PAIN-EPIGASTRIC 09/14/2009   Cardiomyopathy, secondary (HCC) 09/06/2009   Cardiomyopathy, unspecified (HCC) 09/06/2009   Hypertensive urgency 08/30/2009   Essential hypertension 08/30/2009   DM2 (diabetes mellitus, type 2) (HCC) 07/30/2009   HLD (hyperlipidemia) 07/30/2009   GERD 07/30/2009   Gastroparesis 07/30/2009   CHEST PAIN, ATYPICAL 07/30/2009   POSITIVE PPD 07/30/2009   Type 2 diabetes mellitus with hyperglycemia (HCC) 07/30/2009   Gastric atony 07/30/2009   Gastroesophageal reflux disease 07/30/2009   Hyperlipidemia, unspecified 07/30/2009   Tuberculin test reaction 07/30/2009   PCP:  Dorina Loving, PA-C Pharmacy:   [facility] [address] [place] - [address] [place] [address] Phone: [phone] Fax: [phone]  [place] [facility] [address] [place] [address] Phone: [phone] Fax: [phone]

## 2024-03-03 NOTE — ED Notes (Signed)
 Manual BP obtained 78/60

## 2024-03-03 NOTE — Inpatient Diabetes Management (Addendum)
 Inpatient Diabetes Program Recommendations  AACE/ADA: New Consensus Statement on Inpatient Glycemic Control (2015)  Target Ranges:  Prepandial:   less than 140 mg/dL      Peak postprandial:   less than 180 mg/dL (1-2 hours)      Critically ill patients:  140 - 180 mg/dL   Lab Results  Component Value Date   GLUCAP 179 (H) 03/03/2024   HGBA1C 7.4 (H) 12/31/2022     Discharge Recommendations: Intermediate acting recommendations: insulin  isophane & regular (NOVOLIN 70/30 RELION PEN) ([facility] Only) 26 units BID  Supply/Referral recommendations: Glucometer Pen needles - standard Test strips Lancet device Lancets   Use Adult Diabetes Insulin  Treatment Post Discharge order set. TOC consult placed- DM coordinator spoke with patient yesterday and told she lost her insurance 02/10/24. Secure chat sent to MD.   Addendum: Spoke with patient briefly. Patient not appropriate at this time. Left meter at bedside following conversation with DM coordinator 1/21.    Thanks, Tinnie Minus, MSN, RNC-OB Diabetes Coordinator [phone] (8a-5p)

## 2024-03-03 NOTE — Discharge Summary (Addendum)
 " Physician Discharge Summary   Patient: [name] MRN: [id number] DOB: Feb 28, 1965  Admit date:     03/02/2024  Discharge date: 03/03/24  Discharge Physician: Amaryllis Dare   PCP: Brenda Loving, PA-C   Recommendations at discharge:  Please obtain CBC and BMP on follow-up Please encourage compliance with medication Follow-up with primary care provider within a week  Discharge Diagnoses: Principal Problem:   DKA (diabetic ketoacidosis) (HCC) Active Problems:   Type 2 diabetes mellitus with hyperglycemia (HCC)   GERD (gastroesophageal reflux disease)   Hyperlipidemia   Hypertension   Asthma   Hospital Course: Partly taken from H&P.  [name] is a 59 y.o. female with medical history significant of abdominal pain, gastroparesis, history of pyelonephritis, history of respiratory failure with hypoxia, left ankle pain, anxiety, asthma, CAD, cardiomegaly, secondary cardiomyopathy, cellulitis of the left lower extremity.  History of cervical cancer, chronic back pain, chronic diastolic heart failure, stage III CKD, chronic pain syndrome, coffee-ground emesis, type 2 diabetes, hyperlipidemia, hypertension, hypomagnesemia, vitamin D deficiency who presented to the emergency department with complaints of urinary frequency, nausea and multiple episodes of emesis. Positive polyuria, polydipsia, and blurred vision.  She ran out of insulin  4 days ago.   On presentation patient was tachycardic and tachypneic, labs with concern of DKA, UA concerning for UTI.  Lipase elevated at 412. CT abdominal and pelvis with borderline urinary bladder wall thickening consistent with UTI.  Patient was started on insulin  infusion with Endo tool, also started on ceftriaxone  for UTI.  Cultures were ordered.  On second lab draw potassium was 7.1, likely a lab error or was drawn from the side potassium supplement was running as potassium on admission was 3.4.  She was given calcium  gluconate, insulin   and a stat repeat came back potassium at 2.8.  1/22: Blood pressure elevated at 167/110.  Labs with potassium of 3.4, creatinine improved to 1.28, beta-hydroxybutyrate at 0.16.  Has been switched to basal and SSI.  Amlodipine  dose was increased to 10 mg daily.  Giving some potassium.  Blood pressure decreased to 90 systolic after taking 10 mg of amlodipine  so dose was decreased back to home dose of 5 mg daily.  Patient apparently ran out of all of her medications and has not seen her physician for a while.  Per patient she was taking oxycodone  and was asking for a refill which was not provided as it was not appropriate.  She has to follow-up with her primary care provider who was giving her oxycodone  for a refill.  She was given 1 dose of oxycodone  and GI cocktail for concern of some epigastric pain before discharge.  Patient pretty quickly responded to insulin .  There was no acidosis noted on any of her labs.  She was also given a 5-day course of Keflex  for concern of UTI, prior urine cultures with E. coli only shows intermittent resistance to Cipro .  Current urine cultures are pending.  Patient was given all new prescriptions except oxycodone  as she ran out of all of her medications.  She should continue on current medications and need to have a close follow-up with primary care provider for further assistance.  Consultants: None Procedures performed: None Disposition: Home Diet recommendation:  Carb modified diet DISCHARGE MEDICATION: Allergies as of 03/03/2024       Reactions   Lisinopril  Swelling   Angioedema   Oxycodone -acetaminophen  Nausea Only   Latex Itching, Swelling, Rash   Morphine Itching, Rash  Medication List     STOP taking these medications    promethazine  12.5 MG tablet Commonly known as: PHENERGAN    topiramate  50 MG tablet Commonly known as: TOPAMAX        TAKE these medications    albuterol  108 (90 Base) MCG/ACT inhaler Commonly known as:  VENTOLIN  HFA Inhale 2 puffs into the lungs every 6 (six) hours as needed for wheezing or shortness of breath.   amLODipine  10 MG tablet Commonly known as: NORVASC  Take 1 tablet (10 mg total) by mouth daily. Start taking on: March 04, 2024 What changed:  medication strength how much to take   apixaban  5 MG Tabs tablet Commonly known as: Eliquis  Take 1 tablet (5 mg total) by mouth 2 (two) times daily.   atorvastatin  80 MG tablet Commonly known as: LIPITOR  Take 1 tablet (80 mg total) by mouth daily.   baclofen  10 MG tablet Commonly known as: LIORESAL  Take 1 tablet (10 mg total) by mouth 3 (three) times daily as needed for muscle spasms.   Basaglar  KwikPen 100 UNIT/ML INJECT 40 UNITS SUBCUTANEOUSLY AT BEDTIME What changed: See the new instructions.   carvedilol  25 MG tablet Commonly known as: COREG  Take 1 tablet (25 mg total) by mouth 2 (two) times daily with a meal.   cephALEXin  500 MG capsule Commonly known as: KEFLEX  Take 1 capsule (500 mg total) by mouth 3 (three) times daily for 5 days.   gabapentin  300 MG capsule Commonly known as: NEURONTIN  Take 2 capsules (600 mg total) by mouth 3 (three) times daily.   metFORMIN  500 MG 24 hr tablet Commonly known as: GLUCOPHAGE -XR Take 1 tablet (500 mg total) by mouth daily with breakfast.   nitroGLYCERIN  0.4 MG SL tablet Commonly known as: NITROSTAT  Place 1 tablet (0.4 mg total) under the tongue every 5 (five) minutes as needed for chest pain.   ondansetron  4 MG tablet Commonly known as: ZOFRAN  Take 1 tablet (4 mg total) by mouth every 6 (six) hours as needed for refractory nausea / vomiting.   Oxycodone  HCl 10 MG Tabs Take 1 tablet (10 mg total) by mouth 3 (three) times daily as needed for severe pain   Ozempic  (0.25 or 0.5 MG/DOSE) 2 MG/3ML Sopn Generic drug: Semaglutide (0.25 or 0.5MG /DOS) Inject 0.5 mg into the skin once a week. mondays   pantoprazole  40 MG tablet Commonly known as: PROTONIX  Take 1 tablet (40 mg  total) by mouth daily.   Pen Needles 32G X 4 MM Misc To use with your insulin  pen   sertraline  25 MG tablet Commonly known as: ZOLOFT  Take 1 tablet (25 mg total) by mouth daily.   VITAMIN D PO Take 5,000 Units by mouth daily.        Discharge Exam: There were no vitals filed for this visit. General.  Well-developed lady, in no acute distress. Pulmonary.  Lungs clear bilaterally, normal respiratory effort. CV.  Regular rate and rhythm, no JVD, rub or murmur. Abdomen.  Soft, nontender, nondistended, BS positive. CNS.  Alert and oriented .  No focal neurologic deficit. Extremities.  No edema, pulses intact and symmetrical. Psychiatry.  Judgment and insight appears normal.   Condition at discharge: stable  The results of significant diagnostics from this hospitalization (including imaging, microbiology, ancillary and laboratory) are listed below for reference.   Imaging Studies: CT ABDOMEN PELVIS WO CONTRAST Result Date: 03/02/2024 EXAM: CT ABDOMEN AND PELVIS WITHOUT CONTRAST 03/02/2024 12:15:19 PM TECHNIQUE: CT of the abdomen and pelvis was performed without the administration of intravenous  contrast. Multiplanar reformatted images are provided for review. Automated exposure control, iterative reconstruction, and/or weight-based adjustment of the mA/kV was utilized to reduce the radiation dose to as low as reasonably achievable. COMPARISON: 04/28/2023 CLINICAL HISTORY: Central abdominal pain over the last 4 days with nausea and vomiting. FINDINGS: LOWER CHEST: No acute abnormality. LIVER: The liver is unremarkable. GALLBLADDER AND BILE DUCTS: Cholecystectomy. No biliary ductal dilatation. SPLEEN: No acute abnormality. PANCREAS: No acute abnormality. ADRENAL GLANDS: No acute abnormality. KIDNEYS, URETERS AND BLADDER: No stones in the kidneys or ureters. No hydronephrosis. No perinephric or periureteral stranding. Borderline wall thickening in the urinary bladder, correlate with urine  analysis and assessing for urinary tract infection. GI AND BOWEL: Stomach demonstrates no acute abnormality. There is no bowel obstruction. Normal appendix. PERITONEUM AND RETROPERITONEUM: No ascites. No free air. VASCULATURE: Aorta is normal in caliber. Moderate aortoiliac atheromatous vascular calcifications. LYMPH NODES: Prior bilateral pelvic lymph node dissection. REPRODUCTIVE ORGANS: Uterus absent. BONES AND SOFT TISSUES: Inferior endplate irregularity at L4 is stable from 04/28/2023 and accordingly likely to be degenerative and due to confluent Schmorl's nodes or prior endplate fracture rather than active discitis. Lower lumbar spondylosis and degenerative disc disease contributing to foraminal narrowing at L3-L4, L4-L5, and L5-S1 with suspected central stenosis at L4-L5 and L5-S1. No focal soft tissue abnormality. IMPRESSION: 1. Borderline urinary bladder wall thickening, consider urinalysis for to rule out urinary tract infection. 2. Lower lumbar spondylosis and degenerative disc disease with foraminal narrowing at L3-4, L4-5, and L5-S1, and suspected central canal stenosis at L4-5 and L5-S1. 3. Moderate aortoiliac atherosclerotic calcifications. Electronically signed by: Ryan Salvage MD 03/02/2024 12:40 PM EST RP Workstation: [id number]    Microbiology: Results for orders placed or performed during the hospital encounter of 09/17/23  Urine Culture     Status: Abnormal   Collection Time: 09/17/23  5:08 PM   Specimen: Urine, Clean Catch  Result Value Ref Range Status   Specimen Description URINE, CLEAN CATCH  Final   Special Requests   Final    Normal Performed at [facility], [address], [place], [place] [address]    Culture >=100,000 COLONIES/mL ESCHERICHIA COLI (A)  Final   Report Status 09/19/2023 FINAL  Final   Organism ID, Bacteria ESCHERICHIA COLI (A)  Final      Susceptibility   Escherichia coli - MIC*    AMPICILLIN  8 SENSITIVE Sensitive     CEFAZOLIN  <=4 SENSITIVE  Sensitive     CEFEPIME  <=0.12 SENSITIVE Sensitive     CEFTRIAXONE  <=0.25 SENSITIVE Sensitive     CIPROFLOXACIN  0.5 INTERMEDIATE Intermediate     GENTAMICIN <=1 SENSITIVE Sensitive     IMIPENEM <=0.25 SENSITIVE Sensitive     NITROFURANTOIN  <=16 SENSITIVE Sensitive     TRIMETH /SULFA  <=20 SENSITIVE Sensitive     AMPICILLIN /SULBACTAM <=2 SENSITIVE Sensitive     PIP/TAZO <=4 SENSITIVE Sensitive ug/mL    * >=100,000 COLONIES/mL ESCHERICHIA COLI    Labs: CBC: Recent Labs  Lab 03/02/24 0809 03/02/24 0824 03/03/24 0444  WBC 10.1  --  10.8*  HGB 17.4* 18.4* 12.9  HCT 50.9* 54.0* 39.2  MCV 85.8  --  89.3  PLT 372  --  287   Basic Metabolic Panel: Recent Labs  Lab 03/02/24 1348 03/02/24 1712 03/02/24 1851 03/02/24 2133 03/03/24 0125 03/03/24 0444  NA 139 134* 144 138 135 137  K 3.4* 7.1* 2.8* 3.5 4.2 3.4*  CL 99 101 116* 100 99 101  CO2 27 26 20* 31 29 31  GLUCOSE 146* 293* 250* 106* 243* 121*  BUN 37* 30* 23* 32* 30* 28*  CREATININE 1.65* 1.33* 0.93 1.50* 1.42* 1.28*  CALCIUM  9.2 8.3* 5.6* 9.4 9.0 8.9  MG 2.2  --   --   --   --   --   PHOS  --  2.5  --   --   --   --    Liver Function Tests: Recent Labs  Lab 03/02/24 0809 03/02/24 1954 03/03/24 0444  AST 21 23 23   ALT 14 14 13   ALKPHOS 145* 117 103  BILITOT 0.7 0.4 0.3  PROT 9.0* 7.7 6.5  ALBUMIN 4.1 3.6 3.1*   CBG: Recent Labs  Lab 03/03/24 0041 03/03/24 0159 03/03/24 0516 03/03/24 0801 03/03/24 0828  GLUCAP 206* 261* 101* 67* 74    Discharge time spent: greater than 30 minutes.  This record has been created using Conservation officer, historic buildings. Errors have been sought and corrected,but may not always be located. Such creation errors do not reflect on the standard of care.   Signed: Amaryllis Dare, MD [facility] 03/03/2024 "

## 2024-03-03 NOTE — ED Notes (Signed)
 Nurse tech notified RN of glucose level. Orange juice given to patient. Nurse tech made aware of CBG recheck in 15 minutes.

## 2024-03-03 NOTE — ED Notes (Addendum)
Manual BP: 86/58

## 2024-03-03 NOTE — ED Notes (Signed)
 Provider repaged to evaluate patient status. Continue to wait for a response.

## 2024-03-04 ENCOUNTER — Other Ambulatory Visit (HOSPITAL_COMMUNITY): Payer: Self-pay

## 2024-03-04 ENCOUNTER — Telehealth: Payer: Self-pay

## 2024-03-04 ENCOUNTER — Encounter (HOSPITAL_BASED_OUTPATIENT_CLINIC_OR_DEPARTMENT_OTHER): Payer: Self-pay

## 2024-03-04 ENCOUNTER — Other Ambulatory Visit (HOSPITAL_BASED_OUTPATIENT_CLINIC_OR_DEPARTMENT_OTHER): Payer: Self-pay

## 2024-03-04 ENCOUNTER — Other Ambulatory Visit: Payer: Self-pay

## 2024-03-04 ENCOUNTER — Encounter: Payer: Self-pay | Admitting: Medical

## 2024-03-04 LAB — URINE CULTURE: Culture: >=100000 — AB

## 2024-03-04 MED ORDER — BASAGLAR KWIKPEN 100 UNIT/ML ~~LOC~~ SOPN
40.0000 [IU] | PEN_INJECTOR | Freq: Every day | SUBCUTANEOUS | 0 refills | Status: DC
  Filled 2024-03-04: qty 15, 37d supply, fill #0

## 2024-03-04 NOTE — Telephone Encounter (Signed)
 Pt came to office to pick up eliquis  samples but when she went down stairs to pick up insulin  pen they said it could not be filled. When calling down to med center pharmacy to see what can be done they said there was nothing they could do, due to her insurance being family planning and would not cover anything. They did advise me that she get in contact with the case manager at the hospital due to them having a match program that they should have offered pt before she was discharged. I wrote down the information and presented it to [name]. She did not seem too please and stated  so I minus well check myself back into the hospital for the weekend just so I can have my insulin  I told her that we are working on the resources that we have to help her, but that it will take time. She said ok and left the office

## 2024-03-04 NOTE — Telephone Encounter (Signed)
 Copied from CRM #[id number]. Topic: Clinical - Medication Question >> Mar 04, 2024  1:33 PM Burnard DEL wrote: Reason for CRM: Patient called in stating that she was in the hospital and was discharged on yestrday. She would like to know if the office have any samples of Insulin  Glargine (BASAGLAR  KWIKPEN) 100 UNIT/ML or apixaban  (ELIQUIS ) 5 MG TABS tablet? Patient stated that both of these medications are costing her over 2000 dollars,and she's not able to afford that

## 2024-03-04 NOTE — Telephone Encounter (Signed)
 Called pt and left vm to see if she still needed samples as when I looked in her chart the hospital already filled it for her at [facility]     Copied from CRM #[id number]. Topic: Clinical - Medication Question >> Mar 03, 2024  4:29 PM [name] wrote: Reason for CRM: Pt is requesting some samples of her insulin  and eliquis . Pt stated that she is out of insulin  and would like a callback with an update on this request.

## 2024-03-04 NOTE — Addendum Note (Signed)
 Addended by: DORINA DALLAS DORINA PA-C M on: 03/04/2024 02:00 PM   Modules accepted: Orders

## 2024-03-05 ENCOUNTER — Telehealth (HOSPITAL_BASED_OUTPATIENT_CLINIC_OR_DEPARTMENT_OTHER): Payer: Self-pay | Admitting: *Deleted

## 2024-03-05 ENCOUNTER — Encounter: Payer: Self-pay | Admitting: Medical

## 2024-03-05 NOTE — Progress Notes (Signed)
 ED Antimicrobial Stewardship Positive Culture Follow Up   [name] is an 59 y.o. female who presented to [facility] on 03/02/2024 with a chief complaint of  Chief Complaint  Patient presents with   Nausea   Emesis    Recent Results (from the past 720 hours)  Urine Culture     Status: Abnormal   Collection Time: 03/02/24 10:35 AM   Specimen: Urine, Clean Catch  Result Value Ref Range Status   Specimen Description   Final    URINE, CLEAN CATCH Performed at [facility], [address], [place], [place] [address]    Special Requests   Final    NONE Performed at [facility], [address] [place], [place] [address]    Culture (A)  Final    >=100,000 COLONIES/mL ESCHERICHIA COLI >=100,000 COLONIES/mL STREPTOCOCCUS GALLOLYTICUS CALL MICROBIOLOGY LAB IF SENSITIVITIES ARE REQUIRED. STREPTOCOCCUS GALLOLYTICUS    Report Status 03/04/2024 FINAL  Final   Organism ID, Bacteria ESCHERICHIA COLI (A)  Final      Susceptibility   Escherichia coli - MIC*    AMPICILLIN  4 SENSITIVE Sensitive     CEFAZOLIN  (URINE) Value in next row Sensitive      <=1 SENSITIVEThis is a modified FDA-approved test that has been validated and its performance characteristics determined by the reporting laboratory.  This laboratory is certified under the Clinical Laboratory Improvement Amendments CLIA as qualified to perform high complexity clinical laboratory testing.    CEFEPIME  Value in next row Sensitive      <=1 SENSITIVEThis is a modified FDA-approved test that has been validated and its performance characteristics determined by the reporting laboratory.  This laboratory is certified under the Clinical Laboratory Improvement Amendments CLIA as qualified to perform high complexity clinical laboratory testing.    ERTAPENEM Value in next row Sensitive      <=1 SENSITIVEThis is a modified FDA-approved test that has been validated and its performance characteristics  determined by the reporting laboratory.  This laboratory is certified under the Clinical Laboratory Improvement Amendments CLIA as qualified to perform high complexity clinical laboratory testing.    CEFTRIAXONE  Value in next row Sensitive      <=1 SENSITIVEThis is a modified FDA-approved test that has been validated and its performance characteristics determined by the reporting laboratory.  This laboratory is certified under the Clinical Laboratory Improvement Amendments CLIA as qualified to perform high complexity clinical laboratory testing.    CIPROFLOXACIN  Value in next row Intermediate      <=1 SENSITIVEThis is a modified FDA-approved test that has been validated and its performance characteristics determined by the reporting laboratory.  This laboratory is certified under the Clinical Laboratory Improvement Amendments CLIA as qualified to perform high complexity clinical laboratory testing.    GENTAMICIN Value in next row Sensitive      <=1 SENSITIVEThis is a modified FDA-approved test that has been validated and its performance characteristics determined by the reporting laboratory.  This laboratory is certified under the Clinical Laboratory Improvement Amendments CLIA as qualified to perform high complexity clinical laboratory testing.    NITROFURANTOIN  Value in next row Sensitive      <=1 SENSITIVEThis is a modified FDA-approved test that has been validated and its performance characteristics determined by the reporting laboratory.  This laboratory is certified under the Clinical Laboratory Improvement Amendments CLIA as qualified to perform high complexity clinical laboratory testing.    TRIMETH /SULFA  Value in next row Sensitive      <=1 SENSITIVEThis  is a modified FDA-approved test that has been validated and its performance characteristics determined by the reporting laboratory.  This laboratory is certified under the Clinical Laboratory Improvement Amendments CLIA as qualified to perform  high complexity clinical laboratory testing.    AMPICILLIN /SULBACTAM Value in next row Sensitive      <=1 SENSITIVEThis is a modified FDA-approved test that has been validated and its performance characteristics determined by the reporting laboratory.  This laboratory is certified under the Clinical Laboratory Improvement Amendments CLIA as qualified to perform high complexity clinical laboratory testing.    PIP/TAZO Value in next row Sensitive      <=4 SENSITIVEThis is a modified FDA-approved test that has been validated and its performance characteristics determined by the reporting laboratory.  This laboratory is certified under the Clinical Laboratory Improvement Amendments CLIA as qualified to perform high complexity clinical laboratory testing.    MEROPENEM Value in next row Sensitive      <=4 SENSITIVEThis is a modified FDA-approved test that has been validated and its performance characteristics determined by the reporting laboratory.  This laboratory is certified under the Clinical Laboratory Improvement Amendments CLIA as qualified to perform high complexity clinical laboratory testing.    * >=100,000 COLONIES/mL ESCHERICHIA COLI    [x]  Treated with cephalexin , culture growing multiple microorganisms, unclear if cephalexin  will cover both since do not have sensitivities for S. gallolyticus []  Patient discharged originally without antimicrobial agent and treatment is now indicated  New antibiotic prescription: amoxicillin  500 mg PO TID x 5 days  ED Provider: Dr. Rankin Patsey Dolphus Harding, PharmD, BCPS 03/05/2024 11:40 AM

## 2024-03-05 NOTE — Telephone Encounter (Signed)
 Post ED Visit - Positive Culture Follow-up: Unsuccessful Patient Follow-up  Culture assessed and recommendations reviewed by:  [x]  Nikola Glogovac , Pharm.D. []  Venetia Gully, Pharm.D., BCPS AQ-ID []  Garrel Crews, Pharm.D., BCPS []  Almarie Lunger, Pharm.D., BCPS []  [place], [address].D., BCPS, AAHIVP []  Rosaline Bihari, Pharm.D., BCPS, AAHIVP []  Massie Rigg, PharmD []  Jodie Rower, PharmD, BCPS  Positive urine culture  []  Patient discharged without antimicrobial prescription and treatment is now indicated [x]  Organism is resistant to prescribed ED discharge antimicrobial []  Patient with positive blood cultures  Plan: Change antibiotic to Amoxil  500mg  PO TID x 5 days per [facility] [place]  Unable to contact patient , letter will be sent to address on file  [name] 03/05/2024, 4:28 PM

## 2024-03-06 ENCOUNTER — Telehealth: Payer: Self-pay | Admitting: Medical

## 2024-03-06 NOTE — Telephone Encounter (Signed)
 See 03-04-2024 and 03-06-2023 my chart message. I did finally contact patient tonight. She states was doing fine. She borrowed some insulin  from her sister and sugars have been controled in lower 200 range She states just checked and states was 213. Explained to her that [facility] pharmacy told me that  5 rezvoglar  kwik pens woud got $110. Pt is on disability and this is cheaper than reported $2000 quoted price for basaglar . She was discharge from the emergency dept with instruction to use 40 units daily. If I calculating correctly I think that would cost $440 if I calculated correctly.   Pt medicaid does not cover cost of meds? See prior messages I sent you Friday night. Sending rezvoglar  information to Tammy pharmacist as trying to get consult from her.

## 2024-03-06 NOTE — Telephone Encounter (Signed)
 This weekend called [facility]. Info added to request for pharmacy consult. Is there other cheaper options? Wanted to use pen form of basal insulin .  [facility] pharmacy told me that  5 rezvoglar  kwik pens woud got $110. Pt is on disability and this is cheaper than reported $2000 quoted price for basaglar . She was discharge from the emergency dept with instruction to use 40 units daily. If I calculating correctly I think that would cost $440 if I calculated correctly.

## 2024-03-07 ENCOUNTER — Telehealth: Payer: Self-pay

## 2024-03-07 ENCOUNTER — Other Ambulatory Visit (HOSPITAL_COMMUNITY): Payer: Self-pay

## 2024-03-07 ENCOUNTER — Other Ambulatory Visit (HOSPITAL_BASED_OUTPATIENT_CLINIC_OR_DEPARTMENT_OTHER): Payer: Self-pay

## 2024-03-07 ENCOUNTER — Other Ambulatory Visit: Payer: Self-pay | Admitting: Pharmacist

## 2024-03-07 ENCOUNTER — Telehealth: Payer: Self-pay | Admitting: Medical

## 2024-03-07 ENCOUNTER — Encounter: Payer: Self-pay | Admitting: Pharmacist

## 2024-03-07 MED ORDER — GABAPENTIN 600 MG PO TABS
600.0000 mg | ORAL_TABLET | Freq: Three times a day (TID) | ORAL | 0 refills | Status: AC
  Filled 2024-03-07 – 2024-03-16 (×2): qty 90, 30d supply, fill #0

## 2024-03-07 MED ORDER — LANTUS SOLOSTAR 100 UNIT/ML ~~LOC~~ SOPN
40.0000 [IU] | PEN_INJECTOR | Freq: Every day | SUBCUTANEOUS | 1 refills | Status: AC
  Filled 2024-03-07 – 2024-03-16 (×2): qty 15, 30d supply, fill #0

## 2024-03-07 MED ORDER — AMOXICILLIN 500 MG PO CAPS
500.0000 mg | ORAL_CAPSULE | Freq: Three times a day (TID) | ORAL | 0 refills | Status: AC
  Filled 2024-03-07 – 2024-03-16 (×2): qty 15, 5d supply, fill #0

## 2024-03-07 NOTE — Telephone Encounter (Signed)
 See previous phone message about this medication assistance request - from 03/04/2024

## 2024-03-07 NOTE — Telephone Encounter (Signed)
 Spoke with patient - see telephone visit and documentation

## 2024-03-07 NOTE — Progress Notes (Signed)
 "  03/07/2024 Name: [name] MRN: [id number] DOB: Oct 18, 1965  Chief Complaint  Patient presents with   Medication Management   Medication Adherence    [name] is a 58 y.o. year old female who presented for a telephone visit.   They were referred to the pharmacist by their PCP for assistance in managing medication access.    Subjective:  Patient previously had Medicaid coverage but states today that she has none though per the [facility] they are showing that she has Family Planning Benefits - which does not cover any prescription medications except BCPs.  She is working with a case worker to see if she can get Medicaid coverage re-instated.   [name] was seen in the ED 03/02/2024 for elevated blood glucose and UTI. Unfortunately she has not been able to afford her Basaglar  insulin  prescription. She borrowed Humalog from her sister and has been taking it over the last few days. Blood glucose has been in the low 200's.   [name] also mentions that she has not been able to get her gabepentin due to cost. She is taking gabapentin  300mg  - 2 capsules 3 times a day.   I also noticed that the hospital has tried to contact patient about her antibiotic therapy. Urine culture indicated that cephalexin  that was originally prescribed might not be effective. It was recommended that patient's antibiotic be changed to amoxicillin  500mg  - take 3 times day for 5 days. This has not been changed yet because patient was not reached by hospital staff to discuss - noted that a letter would be sent to her.   Care Team: Primary Care Provider: Saguier, Edward, PA-C ; Next Scheduled Visit: 03/10/2024  Medication Access/Adherence  Current Pharmacy:  [facility] [address] [place] - [address] [place] [address] Phone: [phone] Fax: [phone]  [facility] - [facility] [address] [place] [address] Phone: [phone] Fax: [phone]  [facility] - [facility] [name] [place] [address] Phone: [phone] Fax: [phone]   Patient reports affordability concerns with their medications: Yes  Patient reports access/transportation concerns to their pharmacy: Yes  Patient reports adherence concerns with their medications:  Yes  - due to cost.    Objective:  Lab Results  Component Value Date   HGBA1C 7.4 (H) 12/31/2022    Lab Results  Component Value Date   CREATININE 1.28 (H) 03/03/2024   BUN 28 (H) 03/03/2024   NA 137 03/03/2024   K 3.4 (L) 03/03/2024   CL 101 03/03/2024   CO2 31 03/03/2024    Lab Results  Component Value Date   CHOL 118 08/26/2021   HDL 37.30 (L) 08/26/2021   LDLCALC 62 08/26/2021   TRIG 93.0 08/26/2021   CHOLHDL 3 08/26/2021    Medications Reviewed Today     Reviewed by Carla Milling, RPH-CPP (Pharmacist) on 03/07/24 at 1254  Med List Status: <None>   Medication Order Taking? Sig Documenting Provider Last Dose Status Informant  albuterol  (VENTOLIN  HFA) 108 (90 Base) MCG/ACT inhaler [id number]  Inhale 2 puffs into the lungs every 6 (six) hours as needed for wheezing or shortness of breath. Amin, Sumayya, MD  Active   amLODipine  (NORVASC ) 5 MG tablet [id number]  Take 1 tablet (5 mg total) by mouth daily. Caleen Qualia, MD  Active   apixaban  (ELIQUIS ) 5 MG TABS tablet [id number]  Take 1  tablet (5 mg total) by mouth 2 (two) times daily. Amin, Sumayya, MD  Active   atorvastatin  (LIPITOR ) 80 MG tablet [id number] No Take 1 tablet (80 mg total) by mouth daily. Saguier, [place] RIGGERS Past Week Active Self, Pharmacy Records  baclofen  (LIORESAL ) 10 MG tablet [id number]  Take 1 tablet (10 mg total) by mouth 3 (three) times daily as needed for muscle spasms. Caleen Qualia, MD  Active   carvedilol  (COREG ) 25 MG tablet [id number]  Take 1 tablet (25 mg total) by mouth 2 (two) times  daily with a meal. Caleen Qualia, MD  Active   cephALEXin  (KEFLEX ) 500 MG capsule [id number]  Take 1 capsule (500 mg total) by mouth 3 (three) times daily for 5 days. Caleen Qualia, MD  Active   gabapentin  (NEURONTIN ) 300 MG capsule [id number]  Take 2 capsules (600 mg total) by mouth 3 (three) times daily. Amin, Sumayya, MD  Active   Insulin  Glargine (BASAGLAR  KWIKPEN) 100 UNIT/ML [id number]  INJECT 40 UNITS under the skin AT BEDTIME Amin, Sumayya, MD  Active   Insulin  Glargine (BASAGLAR  KWIKPEN) 100 UNIT/ML [id number]  Inject 40 Units into the skin daily. Saguier, [place], PA-C  Active   Insulin  Pen Needle (PEN NEEDLES) 32G X 4 MM MISC [id number]  To use with your insulin  pen Amin, Sumayya, MD  Active   metFORMIN  (GLUCOPHAGE -XR) 500 MG 24 hr tablet [id number]  Take 1 tablet (500 mg total) by mouth daily with breakfast. Caleen Qualia, MD  Active   nitroGLYCERIN  (NITROSTAT ) 0.4 MG SL tablet [id number] No Place 1 tablet (0.4 mg total) under the tongue every 5 (five) minutes as needed for chest pain. Saguier, [place], PA-C Unknown Active Self, Pharmacy Records  ondansetron  (ZOFRAN ) 4 MG tablet [id number]  Take 1 tablet (4 mg total) by mouth every 6 (six) hours as needed for refractory nausea / vomiting. Caleen Qualia, MD  Active   Oxycodone  HCl 10 MG TABS [id number] No Take 1 tablet (10 mg total) by mouth 3 (three) times daily as needed for severe pain Saguier, [place], PA-C Past Week Active Self, Pharmacy Records  OZEMPIC , 0.25 OR 0.5 MG/DOSE, 2 MG/3ML SOPN [id number]  Inject 0.5 mg into the skin once a week. mondays Amin, Sumayya, MD  Active   pantoprazole  (PROTONIX ) 40 MG tablet [id number]  Take 1 tablet (40 mg total) by mouth daily. Caleen Qualia, MD  Active   sertraline  (ZOLOFT ) 25 MG tablet [id number] No Take 1 tablet (25 mg total) by mouth daily. Saguier, [place] PA-C Unknown Active Self, Pharmacy Records           Med Note JACKOLYN WADDELL VEAR Stevan Mar 02, 2024  1:55 PM) States she takes when she has it. Has  not had it in past 30 days.  VITAMIN D PO [id number] No Take 5,000 Units by mouth daily.  Patient not taking: Reported on 03/02/2024   [provider] Not Taking Active Self, Pharmacy Records           Med Note (WHITE, DONETA RAMAN   Wed Apr 29, 2023  2:54 AM)                Assessment/Plan:   Medication Management:  Current strategy insufficient due to medication cost.  - Could not use Basaglar  copay card due to restrictions but could use $35 / month card for Lantus  - changed to Lantus  - take 40 to 50 units daily - incresae by 2 units every 2 days until FBG is < 150.  -  Discussed cost of Gabapentin  - 300mg  #180 os $10 / month and 600mg  would be $15 / month - patient is asking if she can switch to the 600mg  since cost if not that much different. Will consult with her PCP.  - Reviewed her medication list for meds that would be on the [facility] $5 / $10 / $15 list:   $5 / month list: amlodipine  5mg , baclofen  10mg  #30, carvedilol  25mg , metformin  ER 500mg , pantoprazole , sertraline   $10 / month list - atorvastatin  80mg   $15 / month list - gabapentin  600mg  Meds not on list - Albuterol , Basaglar  / Lantus  insulin , Eliquis , Ozempic , oxycodone , vitamin D She can get vitamin D 5000 units at [facility] pharmacy for around $3 / 100 caps or tabs.  - Reminded patient that she should have 21 days of Eliquis  samples to pick up at front desk of our office. Will work on patient assistance program for Eliquis  and Ozempic .   - Will consult with PCP about changing to gabapentin  600mg  - take 1 capsule 3 times a day and also about sending in Rx for amoxicillin  500mg  3 times a day for 5 days.    Follow Up Plan: 1 to 2 weeks.    Madelin Ray, PharmD Clinical Pharmacist [facility]    "

## 2024-03-07 NOTE — Telephone Encounter (Signed)
 PAP: Patient assistance application for Eliquis  through [employer] ([employer] has been mailed to pt's home address on file. Provider portion of application will be faxed to provider's office.  PAP: Patient assistance application for Ozempic  through [employer] has been mailed to pt's home address on file. Provider portion of application will be faxed to provider's office.  Provider portion has been faxed to Dr. Edward Saguier

## 2024-03-07 NOTE — Transitions of Care (Post Inpatient/ED Visit) (Signed)
 "  03/07/2024  Name: [name] MRN: [id number] DOB: 06/11/1965  Today's TOC FU Call Status: Today's TOC FU Call Status:: Successful TOC FU Call Completed TOC FU Call Complete Date: 03/07/24  Patient's Name and Date of Birth confirmed. Name, DOB  Transition Care Management Follow-up Telephone Call Date of Discharge: 03/03/24 Discharge Facility: [facility]) Type of Discharge: Inpatient Admission Primary Inpatient Discharge Diagnosis:: GERD; chronic left sided low back pain How have you been since you were released from the hospital?: Better Any questions or concerns?: No  Items Reviewed: Did you receive and understand the discharge instructions provided?: Yes Medications obtained,verified, and reconciled?: Yes (Medications Reviewed) Any new allergies since your discharge?: No Dietary orders reviewed?: NA Do you have support at home?: Yes  Medications Reviewed Today: Medications Reviewed Today     Reviewed by Lavelle Charmaine NOVAK, LPN (Licensed Practical Nurse) on 03/07/24 at 1520  Med List Status: <None>   Medication Order Taking? Sig Documenting Provider Last Dose Status Informant  albuterol  (VENTOLIN  HFA) 108 (90 Base) MCG/ACT inhaler [id number]  Inhale 2 puffs into the lungs every 6 (six) hours as needed for wheezing or shortness of breath. Amin, Sumayya, MD  Active   amLODipine  (NORVASC ) 5 MG tablet [id number]  Take 1 tablet (5 mg total) by mouth daily. Caleen Qualia, MD  Active   apixaban  (ELIQUIS ) 5 MG TABS tablet [id number]  Take 1 tablet (5 mg total) by mouth 2 (two) times daily. Amin, Sumayya, MD  Active   atorvastatin  (LIPITOR ) 80 MG tablet [id number] No Take 1 tablet (80 mg total) by mouth daily. Saguier, [place] RIGGERS Past Week Active Self, Pharmacy Records  baclofen  (LIORESAL ) 10 MG tablet [id number]  Take 1 tablet (10 mg total) by mouth 3 (three) times daily as needed for muscle spasms. Amin, Sumayya, MD  Active   carvedilol  (COREG ) 25 MG tablet [id number]  Take 1  tablet (25 mg total) by mouth 2 (two) times daily with a meal. Caleen Qualia, MD  Active   cephALEXin  (KEFLEX ) 500 MG capsule [id number]  Take 1 capsule (500 mg total) by mouth 3 (three) times daily for 5 days. Amin, Sumayya, MD  Active   gabapentin  (NEURONTIN ) 300 MG capsule [id number]  Take 2 capsules (600 mg total) by mouth 3 (three) times daily. Amin, Sumayya, MD  Active   insulin  glargine (LANTUS  SOLOSTAR) 100 UNIT/ML Solostar Pen [id number]  Inject 40-50 Units into the skin daily. Adjust dose by 2 units every 2 days until blood glucose in the morning is < 150. Saguier, [place], PA-C  Active   Insulin  Pen Needle (PEN NEEDLES) 32G X 4 MM MISC [id number]  To use with your insulin  pen Amin, Sumayya, MD  Active   metFORMIN  (GLUCOPHAGE -XR) 500 MG 24 hr tablet [id number]  Take 1 tablet (500 mg total) by mouth daily with breakfast. Caleen Qualia, MD  Active   nitroGLYCERIN  (NITROSTAT ) 0.4 MG SL tablet [id number] No Place 1 tablet (0.4 mg total) under the tongue every 5 (five) minutes as needed for chest pain. Saguier, [place], PA-C Unknown Active Self, Pharmacy Records  ondansetron  (ZOFRAN ) 4 MG tablet [id number]  Take 1 tablet (4 mg total) by mouth every 6 (six) hours as needed for refractory nausea / vomiting. Caleen Qualia, MD  Active   Oxycodone  HCl 10 MG TABS [id number] No Take 1 tablet (10 mg total) by mouth 3 (three) times daily as needed for severe pain Saguier, Edward, PA-C Past Week Active Self, Pharmacy Records  OZEMPIC , 0.25 OR 0.5 MG/DOSE,  2 MG/3ML SOPN [id number]  Inject 0.5 mg into the skin once a week. mondays Amin, Sumayya, MD  Active   pantoprazole  (PROTONIX ) 40 MG tablet [id number]  Take 1 tablet (40 mg total) by mouth daily. Amin, Sumayya, MD  Active   sertraline  (ZOLOFT ) 25 MG tablet [id number] No Take 1 tablet (25 mg total) by mouth daily. Saguier, [place] PA-C Unknown Active Self, Pharmacy Records           Med Note JACKOLYN WADDELL VEAR Stevan Mar 02, 2024  1:55 PM) States she takes when she has  it. Has not had it in past 30 days.  VITAMIN D PO [id number] No Take 5,000 Units by mouth daily.  Patient not taking: Reported on 03/02/2024   [provider] Not Taking Active Self, Pharmacy Records           Med Note (WHITE, TONIA S   Wed Apr 29, 2023  2:54 AM)              Home Care and Equipment/Supplies: Were Home Health Services Ordered?: NA Any new equipment or medical supplies ordered?: NA  Functional Questionnaire: Do you need assistance with bathing/showering or dressing?: No Do you need assistance with meal preparation?: No Do you need assistance with eating?: No Do you have difficulty maintaining continence: No Do you need assistance with getting out of bed/getting out of a chair/moving?: No Do you have difficulty managing or taking your medications?: No  Follow up appointments reviewed: PCP Follow-up appointment confirmed?: Yes Date of PCP follow-up appointment?: 03/10/24 Follow-up Provider: [facility] Follow-up appointment confirmed?: NA Do you need transportation to your follow-up appointment?: No Do you understand care options if your condition(s) worsen?: Yes-patient verbalized understanding    SIGNATURE Charmaine Bloodgood, LPN [facility] Health Advisor [place] [facility] You Are. We Are. [facility] Direct Dial [phone]  "

## 2024-03-07 NOTE — Telephone Encounter (Signed)
 Rx refill amxocicilin for uti and rx refill gabapentin  for pain.

## 2024-03-07 NOTE — Telephone Encounter (Signed)
 Copied from CRM #[id number]. Topic: Clinical - Prescription Issue >> Mar 07, 2024  9:06 AM [name] wrote: Reason for CRM: Patient has called in regarding an anti biotic that was prev discussed with Dr Dorina. I did make her an appointment but please contact back to discuss.

## 2024-03-08 ENCOUNTER — Telehealth: Payer: Self-pay

## 2024-03-08 NOTE — Telephone Encounter (Signed)
 Pt has an apt 03/10/2024

## 2024-03-09 ENCOUNTER — Other Ambulatory Visit (HOSPITAL_BASED_OUTPATIENT_CLINIC_OR_DEPARTMENT_OTHER): Payer: Self-pay

## 2024-03-10 ENCOUNTER — Telehealth: Payer: Self-pay

## 2024-03-10 ENCOUNTER — Ambulatory Visit: Payer: Self-pay | Admitting: Medical

## 2024-03-10 NOTE — Telephone Encounter (Signed)
 No show for todays office visit 03/10/2024 @ 9:20 am

## 2024-03-15 NOTE — Telephone Encounter (Signed)
 Received provider portion of patient assistance applications

## 2024-03-16 ENCOUNTER — Other Ambulatory Visit (HOSPITAL_BASED_OUTPATIENT_CLINIC_OR_DEPARTMENT_OTHER): Payer: Self-pay

## 2024-03-16 ENCOUNTER — Other Ambulatory Visit: Payer: Self-pay

## 2024-03-16 ENCOUNTER — Other Ambulatory Visit (HOSPITAL_COMMUNITY): Payer: Self-pay

## 2024-03-16 NOTE — Progress Notes (Unsigned)
 "  03/16/2024 Name: [name] MRN: [id number] DOB: 1965/10/12  No chief complaint on file.   [name] is a 59 y.o. year old female who presented for a telephone visit.   They were referred to the pharmacist by their PCP for assistance in managing medication access.    Subjective:  Patient previously had Medicaid coverage but states today that she has none though per the [facility] they are showing that she has Family Planning Benefits - which does not cover any prescription medications except BCPs.  She is working with a case worker to see if she can get Medicaid coverage re-instated.   [name] was seen in the ED 03/02/2024 for elevated blood glucose and UTI. Unfortunately she has not been able to afford her Basaglar  insulin  prescription. She borrowed Humalog from her sister and has been taking it over the last few days. Blood glucose has been in the low 200's.   [name] also mentions that she has not been able to get her gabepentin due to cost. She is taking gabapentin  300mg  - 2 capsules 3 times a day.   I also noticed that the hospital has tried to contact patient about her antibiotic therapy. Urine culture indicated that cephalexin  that was originally prescribed might not be effective. It was recommended that patient's antibiotic be changed to amoxicillin  500mg  - take 3 times day for 5 days. This has not been changed yet because patient was not reached by hospital staff to discuss - noted that a letter would be sent to her.   Care Team: Primary Care Provider: Saguier, Edward, PA-C ; Next Scheduled Visit: 03/10/2024  Medication Access/Adherence  Current Pharmacy:  [facility] [address] [place] - [address] [place] [address] Phone: [phone] Fax: [phone]  [facility] [address] [place] [address] Phone: [phone] Fax: [phone]  [facility] [address] [place] [address] Phone: [phone] Fax: [phone]   Patient reports affordability concerns with their medications: Yes  Patient reports access/transportation concerns to their pharmacy: Yes  Patient reports adherence concerns with their medications:  Yes  - due to cost.    Objective:  Lab Results  Component Value Date   HGBA1C 7.4 (H) 12/31/2022    Lab Results  Component Value Date   CREATININE 1.28 (H) 03/03/2024   BUN 28 (H) 03/03/2024   NA 137 03/03/2024   K 3.4 (L) 03/03/2024   CL 101 03/03/2024   CO2 31 03/03/2024    Lab Results  Component Value Date   CHOL 118 08/26/2021   HDL 37.30 (L) 08/26/2021   LDLCALC 62 08/26/2021   TRIG 93.0 08/26/2021   CHOLHDL 3 08/26/2021    Medications Reviewed Today   Medications were not reviewed in this encounter       Assessment/Plan:   Medication Management:  Current strategy insufficient due to medication cost.  - Could not use Basaglar  copay card due to restrictions but could use $35 / month card for Lantus  - changed to Lantus  - take 40 to 50 units daily - incresae by 2 units every 2 days until FBG is < 150.  - Discussed cost of Gabapentin  - 300mg  #180 os $10 / month and 600mg  would be $15 / month - patient is asking if she can switch to the 600mg  since cost if not that much different.  Will consult with her PCP.  - Reviewed her medication list for meds that would be on the [facility] $5 / $10 / $15 list:   $5 / month list: amlodipine  5mg , baclofen  10mg  #30, carvedilol  25mg , metformin  ER 500mg , pantoprazole , sertraline   $10 / month list - atorvastatin  80mg   $15 / month list - gabapentin  600mg  Meds not on list - Albuterol , Basaglar  / Lantus  insulin , Eliquis , Ozempic , oxycodone , vitamin D She can get vitamin D 5000 units at [facility] pharmacy for around $3 / 100 caps or tabs.  - Reminded patient that she should have 21 days of Eliquis  samples to pick  up at front desk of our office. Will work on patient assistance program for Eliquis  and Ozempic .   - Will consult with PCP about changing to gabapentin  600mg  - take 1 capsule 3 times a day and also about sending in Rx for amoxicillin  500mg  3 times a day for 5 days.    Follow Up Plan: 1 to 2 weeks.    Madelin Ray, PharmD Clinical Pharmacist [facility]    "

## 2024-03-18 ENCOUNTER — Telehealth: Payer: Self-pay | Admitting: Pharmacist

## 2024-03-18 ENCOUNTER — Other Ambulatory Visit: Payer: Self-pay

## 2024-03-18 ENCOUNTER — Other Ambulatory Visit: Payer: Self-pay | Admitting: Pharmacist

## 2024-03-18 ENCOUNTER — Telehealth: Payer: Self-pay

## 2024-03-18 ENCOUNTER — Ambulatory Visit: Payer: Self-pay

## 2024-03-18 DIAGNOSIS — K219 Gastro-esophageal reflux disease without esophagitis: Secondary | ICD-10-CM

## 2024-03-18 MED ORDER — METFORMIN HCL ER 500 MG PO TB24
500.0000 mg | ORAL_TABLET | Freq: Every day | ORAL | 0 refills | Status: AC
  Filled 2024-03-18: qty 30, 30d supply, fill #0

## 2024-03-18 MED ORDER — PANTOPRAZOLE SODIUM 40 MG PO TBEC
40.0000 mg | DELAYED_RELEASE_TABLET | Freq: Every day | ORAL | 0 refills | Status: AC
  Filled 2024-03-18: qty 30, 30d supply, fill #0

## 2024-03-18 MED ORDER — SERTRALINE HCL 25 MG PO TABS
25.0000 mg | ORAL_TABLET | Freq: Every day | ORAL | 0 refills | Status: AC
  Filled 2024-03-18: qty 30, 30d supply, fill #0

## 2024-03-18 MED ORDER — ATORVASTATIN CALCIUM 40 MG PO TABS
40.0000 mg | ORAL_TABLET | Freq: Every day | ORAL | 0 refills | Status: AC
  Filled 2024-03-18: qty 30, 30d supply, fill #0

## 2024-03-18 NOTE — Telephone Encounter (Signed)
 FYI Only or Action Required?: Action required by provider: request for appointment.  Patient was last seen in primary care on 09/29/2023 by Dorina Loving, PA-C.  Called Nurse Triage reporting Back Pain.  Symptoms began several days ago.  Interventions attempted: Nothing.  Symptoms are: gradually worsening.  Triage Disposition: See HCP Within 4 Hours (Or PCP Triage)  Patient/caregiver understands and will follow disposition?: No, wishes to speak with PCP    Message from [facility] G sent at 03/18/2024  8:28 AM EST  Reason for Triage: she is in so much pain - can't get out of the bed.. says if we can get her the oxycodone  to feel better she will then make an appt?  I did put in refill for oxycodone     Reason for Disposition  [1] SEVERE back pain (e.g., excruciating, unable to do any normal activities) AND [2] not improved 2 hours after pain medicine  Answer Assessment - Initial Assessment Questions No available appts with pcp. Patient requesting appt; in person or video visit with E. Saguier PA only.  Advised call back or ED/911 if symptoms occur/worsen: severe diff breathing, chest pain > 5 min, faint, diff walking, numbness/weakness, loss b/b control, abd pain. Patient verbalized understanding.   Requesting refill for oxycodone ; out of med for 2 days  1. ONSET: When did the pain begin? (e.g., minutes, hours, days)     2 days 2. LOCATION: Where does it hurt? (upper, mid or lower back)     Lower back pain 3. SEVERITY: How bad is the pain?  (e.g., Scale 1-10; mild, moderate, or severe)     6/10 4. PATTERN: Is the pain constant? (e.g., yes, no; constant, intermittent)      constant 5. RADIATION: Does the pain shoot into your legs or somewhere else?     Right leg 6. CAUSE:  What do you think is causing the back pain?      chronic 8. MEDICINES: What have you taken so far for the pain? (e.g., nothing, acetaminophen , NSAIDS)     Tylenol ; not effective 9. NEUROLOGIC  SYMPTOMS: Do you have any weakness, numbness, or problems with bowel/bladder control?     Denies prob b/b control, numbness/weakness 10. OTHER SYMPTOMS: Do you have any other symptoms? (e.g., fever, abdomen pain, burning with urination, blood in urine) Denies diff breath, chest pain, abd pain, fever chills, n/v  Protocols used: Back Pain-A-AH

## 2024-03-18 NOTE — Telephone Encounter (Signed)
 Attempt was made to contact patient by phone today for follow up by Clinical Pharmacist regarding medication management.  Unable to reach patient. LM on VM with my contact number [phone].   I also reviewed notes from earlier today regarding Basaglar  verus Lantus . Patient states she prefer the Basaglar  pen because Lantus  causes swelling. I need to get additional information about what type of swelling this is. [place] Medicaid prefers Lantus  or it's generic insulin  glargine.  If we switch back to Basaglar  we would need to submit prior authorization which would need an in-depth explanation about the issues she has experienced with Lantus  or insulin  glargine.   Sent in 30 days for sertraline , pantoprazole  and metformin . She will see PCP 03/22/2024 for other med RFs requested - oxycodone  and baclofen .

## 2024-03-18 NOTE — Telephone Encounter (Signed)
 Copied from CRM #[id number]. Topic: Clinical - Medication Refill >> Mar 18, 2024  8:25 AM Emylou G wrote: Medication: Oxycodone  HCl 10 MG TABS Baclofen  metformin  pantoprazole  (PROTONIX ) 40 MG tablet sertraline   basaglar  kwikpen 100 units - not on her current list.. says she takes 40 units ( she prefers this pen of the lantus  because of the swelling it gives her )  promethazine  12.5 .SABRA looking for refill but isn't on her current list   Has the patient contacted their pharmacy? No (Agent: If no, request that the patient contact the pharmacy for the refill. If patient does not wish to contact the pharmacy document the reason why and proceed with request.) (Agent: If yes, when and what did the pharmacy advise?)  This is the patient's preferred pharmacy:    [place] [facility] [address] [place] [address] Phone: [phone] Fax: [phone]   Is this the correct pharmacy for this prescription? Yes If no, delete pharmacy and type the correct one.   Has the prescription been filled recently? No  Is the patient out of the medication? Yes  Has the patient been seen for an appointment in the last year OR does the patient have an upcoming appointment? Yes  Can we respond through MyChart? No  Agent: Please be advised that Rx refills may take up to 3 business days. We ask that you follow-up with your pharmacy.

## 2024-03-22 ENCOUNTER — Ambulatory Visit: Admitting: Medical
